# Patient Record
Sex: Female | Born: 1940
Health system: Southern US, Community
[De-identification: ages and names within clinical notes are randomized; demographics above are authoritative.]

## PROBLEM LIST (undated history)

## (undated) DIAGNOSIS — G629 Polyneuropathy, unspecified: Secondary | ICD-10-CM

## (undated) DIAGNOSIS — I10 Essential (primary) hypertension: Secondary | ICD-10-CM

## (undated) DIAGNOSIS — T4145XA Adverse effect of unspecified anesthetic, initial encounter: Secondary | ICD-10-CM

## (undated) DIAGNOSIS — E119 Type 2 diabetes mellitus without complications: Secondary | ICD-10-CM

## (undated) DIAGNOSIS — I4729 Other ventricular tachycardia: Secondary | ICD-10-CM

## (undated) DIAGNOSIS — I495 Sick sinus syndrome: Secondary | ICD-10-CM

## (undated) DIAGNOSIS — M549 Dorsalgia, unspecified: Secondary | ICD-10-CM

## (undated) DIAGNOSIS — I480 Paroxysmal atrial fibrillation: Secondary | ICD-10-CM

## (undated) DIAGNOSIS — E7800 Pure hypercholesterolemia, unspecified: Secondary | ICD-10-CM

## (undated) DIAGNOSIS — E1122 Type 2 diabetes mellitus with diabetic chronic kidney disease: Secondary | ICD-10-CM

## (undated) DIAGNOSIS — D631 Anemia in chronic kidney disease: Secondary | ICD-10-CM

## (undated) DIAGNOSIS — I639 Cerebral infarction, unspecified: Secondary | ICD-10-CM

## (undated) DIAGNOSIS — I472 Ventricular tachycardia: Secondary | ICD-10-CM

## (undated) DIAGNOSIS — T8859XA Other complications of anesthesia, initial encounter: Secondary | ICD-10-CM

## (undated) DIAGNOSIS — R269 Unspecified abnormalities of gait and mobility: Secondary | ICD-10-CM

## (undated) DIAGNOSIS — J449 Chronic obstructive pulmonary disease, unspecified: Secondary | ICD-10-CM

## (undated) DIAGNOSIS — G8929 Other chronic pain: Secondary | ICD-10-CM

## (undated) DIAGNOSIS — J42 Unspecified chronic bronchitis: Secondary | ICD-10-CM

## (undated) DIAGNOSIS — M1990 Unspecified osteoarthritis, unspecified site: Secondary | ICD-10-CM

## (undated) DIAGNOSIS — N184 Chronic kidney disease, stage 4 (severe): Secondary | ICD-10-CM

## (undated) HISTORY — PX: TUMOR EXCISION: SHX421

## (undated) HISTORY — PX: ABDOMINAL HYSTERECTOMY: SHX81

## (undated) HISTORY — DX: Sick sinus syndrome: I49.5

## (undated) HISTORY — PX: JOINT REPLACEMENT: SHX530

## (undated) HISTORY — DX: Paroxysmal atrial fibrillation: I48.0

## (undated) HISTORY — DX: Unspecified abnormalities of gait and mobility: R26.9

## (undated) HISTORY — DX: Other ventricular tachycardia: I47.29

## (undated) HISTORY — PX: APPENDECTOMY: SHX54

## (undated) HISTORY — PX: TOTAL KNEE ARTHROPLASTY: SHX125

## (undated) HISTORY — DX: Ventricular tachycardia: I47.2

---

## 2007-02-25 ENCOUNTER — Inpatient Hospital Stay (HOSPITAL_COMMUNITY): Admission: RE | Admit: 2007-02-25 | Discharge: 2007-03-03 | Payer: Self-pay | Admitting: Orthopedic Surgery

## 2007-03-04 ENCOUNTER — Ambulatory Visit: Payer: Self-pay | Admitting: Internal Medicine

## 2010-06-28 NOTE — Op Note (Signed)
Jody [name], Jody [name] NO.:  [id number]   MEDICAL RECORD NO.:  [id number]          PATIENT TYPE:  INP   LOCATION:  0011                         FACILITY:  [facility]   PHYSICIAN:  Gaynelle Arabian, M.D.    DATE OF BIRTH:  [birth year]   DATE OF PROCEDURE:  02/25/2007  DATE OF DISCHARGE:                               OPERATIVE REPORT   PREOPERATIVE DIAGNOSIS:  Osteoarthritis right knee with valgus  deformity.   POSTOPERATIVE DIAGNOSIS:  Osteoarthritis right knee with valgus  deformity.   PROCEDURE:  Right total knee arthroplasty.   SURGEON:  Gaynelle Arabian, M.D.   ASSISTANT:  Arlee Muslim PA-C   ANESTHESIA:  Spinal with Duramorph.   ESTIMATED BLOOD LOSS:  Minimal.   DRAINS:  None.   TOURNIQUET TIME:  39 minutes at 300 mmHg.   COMPLICATIONS:  None.   CONDITION:  Stable to recovery.   BRIEF CLINICAL NOTE:  [name] is a 70 year old female with end-stage  arthritis right knee about 25-30 degrees valgus deformity.  She has end-  stage arthritis with significant pain and dysfunction.  She has failed  nonoperative management and presents for total knee arthroplasty.   PROCEDURE IN DETAIL:  After successful administration of spinal  anesthetic, a tourniquet is placed high on the right thigh, right lower  extremity prepped and draped in the usual sterile fashion.  Extremity  was wrapped in Esmarch, knee flexed, tourniquet inflated to 300 mmHg.  Midline incision made with a 10 blade through the subcutaneous tissue to  the level of the extensor mechanism.  A fresh blade is used make a  lateral parapatellar arthrotomy.  We went lateral because of the valgus  deformity.  Soft tissue of the proximal lateral tibia is then  subperiosteally elevated around to the posterolateral corner but not  including the structures of the posterolateral corner.  Patella was  everted medially, knee flexed 90 degrees, ACL and PCL removed.  Drill  was used to create a starting hole in the  distal femur and the canal was  thoroughly irrigated.  The 5 degree right valgus alignment guide was  placed and referencing off the posterior condyles, rotations marked and  the block pinned to remove 10 mm of the distal femur.  Distal femoral  resection is made with an oscillating saw.  Size 3 is the most  appropriate femoral component and rotations marked off the epicondylar  axis.  Size three cutting blocks placed and the anterior-posterior and  chamfer cuts are made.   Tibia is subluxed forward and menisci are removed.  An extramedullary  tibial alignment guide is placed referencing proximally at the medial  aspect of the tibial tubercle and distally along the second metatarsal  axis and tibial crest.  Blocks pinned to remove about 2 mm off the more  deficient lateral side.  Tibial resection is made with an oscillating  saw.  Size 3 is the most appropriate tibial component and the proximal  tibia is prepared with the modular drill and keel punch for size 3.  Femoral preparation is completed with the intercondylar cut.   Size  three mobile bearing tibial, trial size 3 posterior stabilized  femoral trial and a 10 mm posterior stabilized rotating platform insert  trial are placed.  With a 10 there is a little bit of AP and varus-  valgus laxity a couple millimeters so I went to 12.5 which allowed for  full extension with excellent anterior-posterior varus and valgus  balance throughout full range of motion.  The patella was everted and  thickness measured to be 23 mm.  Freehand resection taken to 14 mm, 38  template is placed, lug holes were drilled, trial patella is placed and  it tracks normally.  Osteophytes removed from the posterior femur with  the trial in place.  All trials removed and the cut bone surfaces are  prepared with pulsatile lavage.  Cements mixed and once ready for  implantation a size three mobile bearing tibial tray, size 3 posterior  stabilized femur and 35  patella was cemented in place.  Patella is held  with a clamp.  Trial 12.5 insert is placed, knee held in full extension  and all extruded cement removed.  Once cement fully hardened then the  trial was removed and the wound copiously irrigated with saline  solution.  The FloSeal was then placed on the posterior capsule and the  permanent 12.5 mm posterior stabilized rotating platform insert is  placed into the tibial tray.  We then placed remainder of FloSeal in the  medial and lateral gutters and suprapatellar area.  Moist sponge is  placed and tourniquet released with total time of 39 minutes.  Sponge is  held for two minutes removed and minimal bleeding was encountered.  The  bleeding that is encountered stopped with electrocautery.  Wounds again  irrigated with saline solution and then the arthrotomy closed with  interrupted #1 PDS.  Left open a small area from the superior to  inferior pole of patella to serve as a mini lateral release.  Subcu was  closed with interrupted 2-0 Vicryl and subcuticular running 4-0  Monocryl.  Incisions cleaned and dried and Steri-Strips and bulky  sterile dressing applied.  She is then placed into a knee immobilizer,  awakened and transferred to recovery in stable condition.      Gaynelle Arabian, M.D.  Electronically Signed     FA/MEDQ  D:  02/25/2007  T:  02/26/2007  Job:  [id number]

## 2010-07-01 NOTE — Discharge Summary (Signed)
NAMEALEXAS, Jody [name] NO.:  [id number]   MEDICAL RECORD NO.:  [id number]          PATIENT TYPE:  INP   LOCATION:  [place]                         FACILITY:  [facility]   PHYSICIAN:  Gaynelle Arabian, M.D.    DATE OF BIRTH:  04-12-1940   DATE OF ADMISSION:  02/25/2007  DATE OF DISCHARGE:  03/03/2007                               DISCHARGE SUMMARY   ADMISSION DIAGNOSES:  1. Osteoarthritis of the right knee with significant valgus deformity      about 25 degrees.  2. History of migraines.  3. Hypertension.  4. Varicose veins.  5. Non-insulin-dependent diabetes mellitus.   DISCHARGE DIAGNOSES:  1. Osteoarthritis of the right knee with valgus deformity, status post      right total knee arthroplasty.  2. Postoperative blood loss anemia.  3. Status post transfusion without sequela.  4. Postoperative hyponatremia, improved.  5. Postoperative hyperkalemia, improved.  6. Postoperative transient ileus, improved.  7. History of migraines.  8. Hypertension.  9. Varicose veins.  10.Non-insulin-dependent diabetes mellitus.   PROCEDURE:  On February 25, 2007, right total knee surgery by Dr. Gaynelle Arabian, assisted by Jody [name], P.A.C.  Anesthesia was  spinal with Duramorph.   CONSULTATIONS:  [facility].   HISTORY:  Jody [name] is a 70 year old female with end-stage  osteoarthritis of the right knee with about a 25-30 degree valgus  deformity.  She has end-stage arthritis with significant pain and  dysfunction now.  She has failed all conservative management and now  presents for a total knee arthroplasty.   LABORATORY DATA:  Preoperative CBC shows a low hemoglobin of 10,  hematocrit 28.8, white cell count 6.9, platelets 209.  Hemoglobin  dropped down to 8.4.  She was given blood and went back up to 9.5 and  drifted back down to 8.4.  The last H&H 8 and 23.6 respectively.  PT and  PTT preoperatively 12.8 and 25 respectively.  INR 0.9.  Serial  pro-times  followed with PTT and INR 24.1 and 2.1 respectively.  Chemistry panel on  admission:  BUN and creatinine elevated at 27 and 1.64 respectively.  The remaining chemistry panel within normal limits.  Serial BMETs were  followed:  Sodium did drop from 143 to 133, back up to 139.  Potassium  was 4.5, up to 5.3 and back down to 3.6.  BUN went up to 33, back down  to 16.  Creatinine went from 1.6 up to 1.84, back down to 1.19.  Amylase  and lipase taken on February 28, 2007:  Normal at 101 and 29.  Repeat  chemistry panel on February 28, 2007, showed a drop in the total protein  from 7 to 5.9 and a drop in the albumin from 3.5 to 2.7.  Liver function  tests were normal preop and on follow-up set.  Urinalysis:  Trace  leukocyte esterase with a few epithelial cells.  Otherwise negative with  0-2 white cells and rare bacteria.  Follow-up urinalysis showed a trace  of hemoglobin, a few epithelial cells, 0-2 white cells, 0-2 red cells, a  few bacteria.  Urine culture:  E. Coli.   Electrocardiogram dated February 20, 2007:  Marked sinus bradycardia,  minimal voltage criteria for left ventricular dysfunction, read by  Dr.  Babs Bertin.   X-RAYS:  A portable abdomen on February 28, 2007:  Mild gaseous bowel  distention, suggesting an ileus.  Abdomen and acute chest on March 01, 2007:  Elevation of the right hemidiaphragm bibasilar atelectasis.  Mild  cardiac enlargement.  Moderate stool throughout the colon may suggest  constipation.  No evidence of ileus or small bowel obstruction.   HOSPITAL COURSE:  The patient admitted to [facility] and was  taken to the operating room and underwent the above-stated procedure  without complications.  The patient tolerated the procedure well.  She  was transferred to the recovery room and then to the orthopedic floor.  She was started on a PCA for pain control, with improvement of the pain  following surgery.  She was doing a little bit  better on the morning of  postoperative day one.  She had some low urinary outputs, so was given a  fluid challenge.  She was noted to have some elevated potassium.  We  held the chlorthalidone.  Metformin was on hold due to the elevated BUN  and creatinine.  Started on a sliding scale for diabetes.  Hemoglobin  was down to 8.4, so we gave her a couple of units of blood.  She  tolerated the blood well.  Felt a little bit better on the morning of  day two.  Had already started getting up with therapy, started mobility.  Doing a little bit better on day two.  Hemoglobin was back up to 9.5.  Her output had improved.  She was noted to have low sodiums.  We stopped  the fluids.  She was diuresing well.  Serum glucose was improving.  She  was started on the iron supplement by day three; however, she had  developed some right-sided groin pain and abdominal pain.  Amylase and  lipase were checked, which were found to be normal.  BUN and creatinine  were still a little elevated, but they were improving.  The abdominal  films suggested mild gaseous distention, suggestive of an ileus.  We got  a GI consultation.  The patient was seen in consultation by [facility].  Enemas and medications were ordered.  Repeat KUB  showed an ileus but clinically the patient is improved after removing  __________  on day four.  Hemoglobin was back down to 8.4.  Continue  with the iron.  Urinalysis and urine culture had been ordered by  gastroenterology, due to the lower abdominal pain, and those results  are still pending.  She felt much better that day after the results with  the enema.  The ileus was felt to be transient.  She was improving.  Started advancing her diet over the weekend. The patient slowly  progressed with physical therapy.  Improved from a gastroenterology  standpoint.  Tolerated medications.   By March 03, 2007, the patient was doing well.  Had a low hemoglobin,  but asymptomatic  with this.  She was discharged home.   DISCHARGE PLAN:  Discharged home on March 03, 2007.   DISCHARGE MEDICATIONS:  1. Percocet.  2. Robaxin.  3. Nu-Iron.  4. Coumadin.  5. Colace.  6. Ambien.   DIET:  A heart-healthy, diabetic diet.   ACTIVITY:  Weightbearing as tolerated for right lower extremity.  Total  knee protocol.  FOLLOWUP:  Follow up in two weeks.   DISPOSITION/CONDITION ON DISCHARGE:  Improving.   NOTATION:  Please note that the urine culture was pending at the time of  discharge, but did prove to be Escherichia coli.  This will be followed  up on an outpatient basis.      Jody [name], P.A.C.      Gaynelle Arabian, M.D.  Electronically Signed    ALP/MEDQ  D:  04/16/2007  T:  04/16/2007  Job:  [id number]   cc:   Sherril Cong, MD   [facility]   Gaynelle Arabian, M.D.  Fax: [phone]

## 2010-07-01 NOTE — H&P (Signed)
NAMENANCYJO, SEEPERSAD NO.:  [id number]   MEDICAL RECORD NO.:  [id number]        PATIENT TYPE:  LINP   LOCATION:                               FACILITY:  [facility]   PHYSICIAN:  Gaynelle Arabian, M.D.    DATE OF BIRTH:  February 03, 1941   DATE OF ADMISSION:  02/25/2007  DATE OF DISCHARGE:                              HISTORY & PHYSICAL   DATE OF OFFICE VISIT HISTORY AND PHYSICAL:  February 04, 2007   CHIEF COMPLAINT:  Right knee pain.   HISTORY OF PRESENT ILLNESS:  The patient is a 70 year old female who has  been seen by Dr. Wynelle Link for a second opinion for ongoing right knee  pain.  She has known arthritic knees.  She has been treated  conservatively in the past.  She is seen as a second opinion and found  to have horrible  end-stage arthritis of the right knee with about a 25  valgus malalignment deformity with bone-on-bone.  She is felt to benefit  from undergoing surgical intervention.  Risks and benefits have been  discussed and she elects to proceed with surgery.   ALLERGIES:  NO KNOWN DRUG ALLERGIES.   INTOLERANCES:  CODEINE causes headache and dizziness.   CURRENT MEDICATIONS:  1. Glucovance.  2. Chlorthalidone/atenolol.  3. Advil or Aleve.   PAST MEDICAL HISTORY:  1. History of migraines.  2. Hypertension.  3. Varicose veins.  4. Non-insulin-dependent diabetes mellitus.  5. Arthritis.  6. Bursitis.   PAST SURGICAL HISTORY:  Abdominal tumor removed about 40 years ago.   FAMILY HISTORY:  Father deceased in an auto accident in his 55s.  Mother  deceased at age 26.  Sister with breast cancer.   SOCIAL HISTORY:  Married, retired.  Past history of smoking.  No  alcohol.  Two steps entering her home.   REVIEW OF SYSTEMS:  GENERAL:  No fevers, chills, night sweats.  NEUROLOGIC:  Occasional dizziness and headache.  Has not had any  headaches recently.  No seizures, syncope, paralysis.  RESPIRATORY:  No  shortness breath, productive cough or hemoptysis.   CARDIOVASCULAR:  No  chest pain, angina, orthopnea.  GI:  No nausea, vomiting, diarrhea or  constipation.  GU:  No dysuria or hematuria.  MUSCULOSKELETAL:  Joint  pain with the right knee.   PHYSICAL EXAMINATION:  VITAL SIGNS:  Pulse 52, respirations 14, blood  pressure 160/88.  GENERAL:  A 70 year old African-American female, well-nourished, well-  developed, slightly overweight, in no acute distress.  She is alert and  oriented.  Very pleasant.  She is accompanied by her husband.  HEENT:  Normocephalic and atraumatic.  Pupils are round and reactive.  Oropharynx clear.  Extraocular movements are intact.  NECK:  Supple.  CHEST:  Clear anterior and posterior chest walls.  No rhonchi, rales or  wheezing.  HEART:  Regular rate and rhythm.  She has an S1 normal but a split S2  with a faint early systolic ejection murmur graded 2/6 over aortic  point.  ABDOMEN:  Soft, round.  Bowel sounds present.  RECTAL/BREAST/GENITALIA:  Not done; not pertinent to present illness.  EXTREMITIES:  Right knee has a very significant valgus deformity on the  right.  Range of motion of 5 to 125.  Tender more lateral than medial.  No instability.   IMPRESSION:  1. Osteoarthritis, right knee, with significant valgus deformity about      25 degrees.  2. History of migraines.  3. Hypertension.  4. Varicose veins.  5. Non-insulin-dependent diabetes mellitus.   PLAN:  The patient was admitted to [facility] to undergo right  total knee replacement arthroplasty.  Surgery will be performed by Dr.  Gaynelle Arabian.      Alexzandrew L. Perkins, P.A.C.      Gaynelle Arabian, M.D.  Electronically Signed    ALP/MEDQ  D:  02/04/2007  T:  02/04/2007  Job:  [id number]   cc:   Sherril Cong, MD

## 2010-11-03 LAB — BASIC METABOLIC PANEL
BUN: 26 — ABNORMAL HIGH
BUN: 24 — ABNORMAL HIGH
CO2: 27
CO2: 25
Chloride: 104
Chloride: 98
Chloride: 104
Chloride: 96
Creatinine, Ser: 1.84 — ABNORMAL HIGH
GFR calc non Af Amer: 30 — ABNORMAL LOW
GFR calc non Af Amer: 38 — ABNORMAL LOW
GFR calc non Af Amer: 45 — ABNORMAL LOW
Glucose, Bld: 162 — ABNORMAL HIGH
Glucose, Bld: 169 — ABNORMAL HIGH
Potassium: 4
Potassium: 5.3 — ABNORMAL HIGH
Potassium: 3.6
Potassium: 3.9
Sodium: 133 — ABNORMAL LOW
Sodium: 139
Sodium: 134 — ABNORMAL LOW
Sodium: 139

## 2010-11-03 LAB — URINALYSIS, ROUTINE W REFLEX MICROSCOPIC
Bilirubin Urine: NEGATIVE
Hgb urine dipstick: NEGATIVE
Ketones, ur: NEGATIVE
Ketones, ur: NEGATIVE
Nitrite: NEGATIVE
Protein, ur: NEGATIVE
Specific Gravity, Urine: 1.02
Urobilinogen, UA: 1
Urobilinogen, UA: 0.2
pH: 6
pH: 6

## 2010-11-03 LAB — CBC
HCT: 24.3 — ABNORMAL LOW
HCT: 27.2 — ABNORMAL LOW
HCT: 28.8 — ABNORMAL LOW
HCT: 25 — ABNORMAL LOW
HCT: 26 — ABNORMAL LOW
Hemoglobin: 10 — ABNORMAL LOW
Hemoglobin: 8.4 — ABNORMAL LOW
Hemoglobin: 9.5 — ABNORMAL LOW
Hemoglobin: 8.8 — ABNORMAL LOW
Hemoglobin: 9.2 — ABNORMAL LOW
MCHC: 34.6
MCHC: 34.7
MCHC: 35
MCHC: 35.3
MCV: 89.5
MCV: 91.7
MCV: 90.2
Platelets: 147 — ABNORMAL LOW
RBC: 2.64 — ABNORMAL LOW
RBC: 3.04 — ABNORMAL LOW
RBC: 2.67 — ABNORMAL LOW
RBC: 2.89 — ABNORMAL LOW
RDW: 13.4
RDW: 13.5
RDW: 14.9
WBC: 10
WBC: 10.2
WBC: 8

## 2010-11-03 LAB — DIFFERENTIAL
Basophils Relative: 0
Monocytes Relative: 8
Neutro Abs: 5.9
Neutrophils Relative %: 74

## 2010-11-03 LAB — PROTIME-INR
INR: 0.9
INR: 1.1
INR: 1.4
INR: 1.9 — ABNORMAL HIGH
INR: 2.1 — ABNORMAL HIGH
Prothrombin Time: 12.8
Prothrombin Time: 22 — ABNORMAL HIGH

## 2010-11-03 LAB — URINE MICROSCOPIC-ADD ON

## 2010-11-03 LAB — URINE CULTURE: Special Requests: NEGATIVE

## 2010-11-03 LAB — AMYLASE: Amylase: 101

## 2010-11-03 LAB — TYPE AND SCREEN
ABO/RH(D): O POS
Antibody Screen: NEGATIVE

## 2010-11-03 LAB — COMPREHENSIVE METABOLIC PANEL
Albumin: 3.5
BUN: 27 — ABNORMAL HIGH
Calcium: 9.4
Creatinine, Ser: 1.64 — ABNORMAL HIGH
Glucose, Bld: 121 — ABNORMAL HIGH
Total Protein: 7

## 2010-11-03 LAB — HEMOGLOBIN AND HEMATOCRIT, BLOOD: Hemoglobin: 8 — ABNORMAL LOW

## 2010-11-03 LAB — HEPATIC FUNCTION PANEL
ALT: 10
AST: 17
Albumin: 2.7 — ABNORMAL LOW
Total Bilirubin: 1

## 2010-11-03 LAB — PREPARE RBC (CROSSMATCH)

## 2013-02-13 DIAGNOSIS — G629 Polyneuropathy, unspecified: Secondary | ICD-10-CM

## 2013-02-13 HISTORY — DX: Polyneuropathy, unspecified: G62.9

## 2013-12-02 ENCOUNTER — Encounter (HOSPITAL_COMMUNITY): Payer: Self-pay | Admitting: Emergency Medicine

## 2013-12-02 ENCOUNTER — Emergency Department (HOSPITAL_COMMUNITY): Payer: Medicare HMO

## 2013-12-02 ENCOUNTER — Emergency Department (HOSPITAL_COMMUNITY)
Admission: EM | Admit: 2013-12-02 | Discharge: 2013-12-02 | Disposition: A | Discharge status: Home / Self Care | Payer: Medicare HMO | Attending: Emergency Medicine | Admitting: Emergency Medicine

## 2013-12-02 DIAGNOSIS — M1990 Unspecified osteoarthritis, unspecified site: Secondary | ICD-10-CM | POA: Insufficient documentation

## 2013-12-02 DIAGNOSIS — R05 Cough: Secondary | ICD-10-CM | POA: Insufficient documentation

## 2013-12-02 DIAGNOSIS — Z7982 Long term (current) use of aspirin: Secondary | ICD-10-CM | POA: Diagnosis not present

## 2013-12-02 DIAGNOSIS — E1142 Type 2 diabetes mellitus with diabetic polyneuropathy: Secondary | ICD-10-CM

## 2013-12-02 DIAGNOSIS — Z79899 Other long term (current) drug therapy: Secondary | ICD-10-CM | POA: Insufficient documentation

## 2013-12-02 DIAGNOSIS — Z87891 Personal history of nicotine dependence: Secondary | ICD-10-CM | POA: Insufficient documentation

## 2013-12-02 DIAGNOSIS — I10 Essential (primary) hypertension: Secondary | ICD-10-CM | POA: Diagnosis not present

## 2013-12-02 DIAGNOSIS — R200 Anesthesia of skin: Secondary | ICD-10-CM | POA: Diagnosis present

## 2013-12-02 HISTORY — DX: Unspecified osteoarthritis, unspecified site: M19.90

## 2013-12-02 HISTORY — DX: Essential (primary) hypertension: I10

## 2013-12-02 LAB — CBC WITH DIFFERENTIAL/PLATELET
BASOS PCT: 0 % (ref 0–1)
Basophils Absolute: 0 10*3/uL (ref 0.0–0.1)
EOS ABS: 0.3 10*3/uL (ref 0.0–0.7)
EOS PCT: 5 % (ref 0–5)
HCT: 28.3 % — ABNORMAL LOW (ref 36.0–46.0)
HEMOGLOBIN: 9.3 g/dL — AB (ref 12.0–15.0)
LYMPHS ABS: 1.4 10*3/uL (ref 0.7–4.0)
Lymphocytes Relative: 25 % (ref 12–46)
MCH: 30 pg (ref 26.0–34.0)
MCHC: 32.9 g/dL (ref 30.0–36.0)
MCV: 91.3 fL (ref 78.0–100.0)
MONOS PCT: 6 % (ref 3–12)
Monocytes Absolute: 0.3 10*3/uL (ref 0.1–1.0)
NEUTROS PCT: 64 % (ref 43–77)
Neutro Abs: 3.6 10*3/uL (ref 1.7–7.7)
PLATELETS: 200 10*3/uL (ref 150–400)
RBC: 3.1 MIL/uL — ABNORMAL LOW (ref 3.87–5.11)
RDW: 14.5 % (ref 11.5–15.5)
WBC: 5.7 10*3/uL (ref 4.0–10.5)

## 2013-12-02 LAB — BASIC METABOLIC PANEL
Anion gap: 12 (ref 5–15)
BUN: 24 mg/dL — AB (ref 6–23)
CALCIUM: 9.5 mg/dL (ref 8.4–10.5)
CO2: 25 mEq/L (ref 19–32)
Chloride: 106 mEq/L (ref 96–112)
Creatinine, Ser: 1.58 mg/dL — ABNORMAL HIGH (ref 0.50–1.10)
GFR, EST AFRICAN AMERICAN: 36 mL/min — AB (ref >90)
GFR, EST NON AFRICAN AMERICAN: 31 mL/min — AB (ref >90)
GLUCOSE: 97 mg/dL (ref 70–99)
POTASSIUM: 4.3 meq/L (ref 3.7–5.3)
Sodium: 143 mEq/L (ref 137–147)

## 2013-12-02 LAB — CK: Total CK: 169 U/L (ref 7–177)

## 2013-12-02 MED ORDER — BENZONATATE 100 MG PO CAPS: 100.0000 mg | ORAL_CAPSULE | Freq: Three times a day (TID) | ORAL | Status: DC

## 2013-12-02 MED ORDER — GABAPENTIN 100 MG PO CAPS: 100.0000 mg | ORAL_CAPSULE | Freq: Three times a day (TID) | ORAL | Status: DC

## 2013-12-02 MED ORDER — HYDROCODONE-ACETAMINOPHEN 5-325 MG PO TABS: 1.0000 | ORAL_TABLET | Freq: Every day | ORAL | Status: DC

## 2013-12-02 NOTE — ED Notes (Signed)
Pt says numbness of arms and legs for 1 year.  Uses walker at home, Dx with  Arthritis.

## 2013-12-02 NOTE — ED Notes (Signed)
Pt alert & oriented x4, stable gait. Patient given discharge instructions, paperwork & prescription(s). Patient  instructed to stop at the registration desk to finish any additional paperwork. Patient verbalized understanding. Pt left department w/ no further questions. 

## 2013-12-02 NOTE — ED Provider Notes (Signed)
CSN: [id number]     Arrival date & time 12/02/13  1231 History   This chart was scribed for Tanna Furry, MD by Ludger Nutting, ED Scribe. This patient was seen in room APA03/APA03 and the patient's care was started 1:40 PM.    No chief complaint on file.  The history is provided by the patient. No language interpreter was used.    HPI Comments: Jody [name] is a 73 y.o. female who presents to the Emergency Department complaining of 1 year of constant, gradually worsened pain and altered sensation to the bilateral feet and hands that has worsened over the last few weeks. She states the symptoms are worse in her lower extremities and has difficulty walking in the mornings. She reports a history of NIDDM for the past 15 years. She has been seen at [facility] and by PCP for the same symptoms. She has a history of an MI. She denies history of CVA or kidney disease. She denies fever, SOB.   Past Medical History  Diagnosis Date  . Arthritis   . Diabetes mellitus without complication   . Hypertension    Past Surgical History  Procedure Laterality Date  . Abdominal surgery    . Joint replacement     History reviewed. No pertinent family history. History  Substance Use Topics  . Smoking status: Former Research scientist (life sciences)  . Smokeless tobacco: Not on file  . Alcohol Use: No   OB History   Grav Para Term Preterm Abortions TAB SAB Ect Mult Living                 Review of Systems  Constitutional: Negative.  Negative for fever.  HENT: Negative.   Eyes: Negative.   Respiratory: Positive for cough. Negative for shortness of breath.   Cardiovascular: Negative.   Gastrointestinal: Negative.   Musculoskeletal: Positive for arthralgias and myalgias.  Neurological: Positive for numbness (altered sensation).  Psychiatric/Behavioral: Negative.   All other systems reviewed and are negative.     Allergies  Review of patient's allergies indicates no known allergies.  Home Medications   Prior to Admission  medications   Medication Sig Start Date End Date Taking? Authorizing Provider  aspirin EC 81 MG tablet Take 81 mg by mouth daily.   Yes Historical Provider, MD  benazepril (LOTENSIN) 40 MG tablet Take 1 tablet by mouth daily. 10/13/13  Yes Historical Provider, MD  carvedilol (COREG) 3.125 MG tablet Take 1 tablet by mouth 2 (two) times daily. 10/14/13  Yes Historical Provider, MD  furosemide (LASIX) 40 MG tablet Take 1 tablet by mouth daily. 10/13/13  Yes Historical Provider, MD  hydrALAZINE (APRESOLINE) 25 MG tablet Take 1 tablet by mouth 2 (two) times daily. 10/13/13  Yes Historical Provider, MD  metFORMIN (GLUCOPHAGE) 1000 MG tablet Take 1 tablet by mouth 2 (two) times daily. 10/13/13  Yes Historical Provider, MD  simvastatin (ZOCOR) 40 MG tablet Take 1 tablet by mouth every evening. 10/14/13  Yes Historical Provider, MD  SYMBICORT 160-4.5 MCG/ACT inhaler Inhale 2 puffs into the lungs 2 (two) times daily. 10/13/13  Yes Historical Provider, MD  traMADol (ULTRAM) 50 MG tablet Take 1 tablet by mouth 3 (three) times daily as needed. 08/31/13  Yes Historical Provider, MD  benzonatate (TESSALON) 100 MG capsule Take 1 capsule (100 mg total) by mouth every 8 (eight) hours. 12/02/13   Tanna Furry, MD  gabapentin (NEURONTIN) 100 MG capsule Take 1 capsule (100 mg total) by mouth 3 (three) times daily. 12/02/13   Elta Guadeloupe  Jeneen Rinks, MD  HYDROcodone-acetaminophen (NORCO/VICODIN) 5-325 MG per tablet Take 1 tablet by mouth at bedtime. 12/02/13   Tanna Furry, MD   Pulse 66  Temp(Src) 98.7 F (37.1 C) (Oral)  Ht 5\' 7"  (1.702 m)  Wt 192 lb (87.091 kg)  BMI 30.06 kg/m2  SpO2 99% Physical Exam  Nursing note and vitals reviewed. Constitutional: She is oriented to person, place, and time. She appears well-developed and well-nourished. No distress.  HENT:  Head: Normocephalic and atraumatic.  Eyes: Conjunctivae are normal. Pupils are equal, round, and reactive to light. No scleral icterus.  Neck: Normal range of motion. Neck  supple. No thyromegaly present.  Cardiovascular: Normal rate and regular rhythm.  Exam reveals no gallop and no friction rub.   No murmur heard. Pulses:      Radial pulses are 2+ on the right side, and 2+ on the left side.       Femoral pulses are 2+ on the right side, and 2+ on the left side.      Dorsalis pedis pulses are 2+ on the right side, and 2+ on the left side.  Pulmonary/Chest: Effort normal and breath sounds normal. No respiratory distress. She has no wheezes. She has no rales.  Abdominal: Soft. Bowel sounds are normal. She exhibits no distension. There is no tenderness. There is no rebound.  Musculoskeletal: Normal range of motion.  Neurological: She is alert and oriented to person, place, and time.  Reports numbness and pain from elbows and knees distally.   Skin: Skin is warm and dry. No rash noted.  Psychiatric: She has a normal mood and affect. Her behavior is normal.    ED Course  Procedures   DIAGNOSTIC STUDIES: Oxygen Saturation is 99% on RA, normal by my interpretation.    COORDINATION OF CARE: 1:46 PM Discussed treatment plan with pt at bedside and pt agreed to plan.   Labs Review Labs Reviewed  CBC WITH DIFFERENTIAL - Abnormal; Notable for the following:    RBC 3.10 (*)    Hemoglobin 9.3 (*)    HCT 28.3 (*)    All other components within normal limits  BASIC METABOLIC PANEL - Abnormal; Notable for the following:    BUN 24 (*)    Creatinine, Ser 1.58 (*)    GFR calc non Af Amer 31 (*)    GFR calc Af Amer 36 (*)    All other components within normal limits  CK    Imaging Review Dg Chest 2 View  12/02/2013   CLINICAL DATA:  Cough.  EXAM: CHEST  2 VIEW  COMPARISON:  Jul 09, 2013.  FINDINGS: The heart size and mediastinal contours are within normal limits. Both lungs are clear. No pneumothorax or pleural effusion is noted. The visualized skeletal structures are unremarkable.  IMPRESSION: No acute cardiopulmonary abnormality seen.   Electronically Signed    By: Sabino Dick M.D.   On: 12/02/2013 14:28     EKG Interpretation None      MDM   Final diagnoses:  Diabetic polyneuropathy associated with type 2 diabetes mellitus    Reassuring studies. Some renal insufficiency likely due to her diabetes. This would be consistent with timeline of 20 years of diabetes.  Diabetes leading to her neuropathy as well. Plan is Neurontin, PCP followup.  I personally performed the services described in this documentation, which was scribed in my presence. The recorded information has been reviewed and is accurate.    Tanna Furry, MD 12/02/13 [phone]

## 2013-12-02 NOTE — Discharge Instructions (Signed)
Diabetic Neuropathy Diabetic neuropathy is a nerve disease or nerve damage that is caused by diabetes mellitus. About half of all people with diabetes mellitus have some form of nerve damage. Nerve damage is more common in those who have had diabetes mellitus for many years and who generally have not had good control of their blood sugar (glucose) level. Diabetic neuropathy is a common complication of diabetes mellitus. There are three more common types of diabetic neuropathy and a fourth type that is less common and less understood:   Peripheral neuropathy--This is the most common type of diabetic neuropathy. It causes damage to the nerves of the feet and legs first and then eventually the hands and arms.The damage affects the ability to sense touch.  Autonomic neuropathy--This type causes damage to the autonomic nervous system, which controls the following functions:  Heartbeat.  Body temperature.  Blood pressure.  Urination.  Digestion.  Sweating.  Sexual function.  Focal neuropathy--Focal neuropathy can be painful and unpredictable and occurs most often in older adults with diabetes mellitus. It involves a specific nerve or one area and often comes on suddenly. It usually does not cause long-term problems.  Radiculoplexus neuropathy-- Sometimes called lumbosacral radiculoplexus neuropathy, radiculoplexus neuropathy affects the nerves of the thighs, hips, buttocks, or legs. It is more common in people with type 2 diabetes mellitus and in older men. It is characterized by debilitating pain, weakness, and atrophy, usually in the thigh muscles. CAUSES  The cause of peripheral, autonomic, and focal neuropathies is diabetes mellitus that is uncontrolled and high glucose levels. The cause of radiculoplexus neuropathy is unknown. However, it is thought to be caused by inflammation related to uncontrolled glucose levels. SIGNS AND SYMPTOMS  Peripheral Neuropathy Peripheral neuropathy develops  slowly over time. When the nerves of the feet and legs no longer work there may be:   Burning, stabbing, or aching pain in the legs or feet.  Inability to feel pressure or pain in your feet. This can lead to:  Thick calluses over pressure areas.  Pressure sores.  Ulcers.  Foot deformities.  Reduced ability to feel temperature changes.  Muscle weakness. Autonomic Neuropathy The symptoms of autonomic neuropathy vary depending on which nerves are affected. Symptoms may include:  Problems with digestion, such as:  Feeling sick to your stomach (nausea).  Vomiting.  Bloating.  Constipation.  Diarrhea.  Abdominal pain.  Difficulty with urination. This occurs if you lose your ability to sense when your bladder is full. Problems include:  Urine leakage (incontinence).  Inability to empty your bladder completely (retention).  Rapid or irregular heartbeat (palpitations).  Blood pressure drops when you stand up (orthostatic hypotension). When you stand up you may feel:  Dizzy.  Weak.  Faint.  In men, inability to attain and maintain an erection.  In women, vaginal dryness and problems with decreased sexual desire and arousal.  Problems with body temperature regulation.  Increased or decreased sweating. Focal Neuropathy  Abnormal eye movements or abnormal alignment of both eyes.  Weakness in the wrist.  Foot drop. This results in an inability to lift the foot properly and abnormal walking or foot movement.  Paralysis on one side of your face (Bell palsy).  Chest or abdominal pain. Radiculoplexus Neuropathy  Sudden, severe pain in your hip, thigh, or buttocks.  Weakness and wasting of thigh muscles.  Difficulty rising from a seated position.  Abdominal swelling.  Unexplained weight loss (usually more than 10 lb [4.5 kg]). DIAGNOSIS  Peripheral Neuropathy Your senses may   be tested. Sensory function testing can be done with:  A light touch using a  monofilament.  A vibration with tuning fork.  A sharp sensation with a pin prick. Other tests that can help diagnose neuropathy are:  Nerve conduction velocity. This test checks the transmission of an electrical current through a nerve.  Electromyography. This shows how muscles respond to electrical signals transmitted by nearby nerves.  Quantitative sensory testing. This is used to assess how your nerves respond to vibrations and changes in temperature. Autonomic Neuropathy Diagnosis is often based on reported symptoms. Tell your health care provider if you experience:   Dizziness.   Constipation.   Diarrhea.   Inappropriate urination or inability to urinate.   Inability to get or maintain an erection.  Tests that may be done include:   Electrocardiography or Holter monitor. These are tests that can help show problems with the heart rate or heart rhythm.   An X-ray exam may be done. Focal Neuropathy Diagnosis is made based on your symptoms and what your health care provider finds during your exam. Other tests may be done. They may include:  Nerve conduction velocities. This checks the transmission of electrical current through a nerve.  Electromyography. This shows how muscles respond to electrical signals transmitted by nearby nerves.  Quantitative sensory testing. This test is used to assess how your nerves respond to vibration and changes in temperature. Radiculoplexus Neuropathy  Often the first thing is to eliminate any other issue or problems that might be the cause, as there is no stick test for diagnosis.  X-ray exam of your spine and lumbar region.  Spinal tap to rule out cancer.  MRI to rule out other lesions. TREATMENT  Once nerve damage occurs, it cannot be reversed. The goal of treatment is to keep the disease or nerve damage from getting worse and affecting more nerve fibers. Controlling your blood glucose level is the key. Most people with  radiculoplexus neuropathy see at least a partial improvement over time. You will need to keep your blood glucose and HbA1c levels in the target range determined by your health care provider. Things that help control blood glucose levels include:   Blood glucose monitoring.   Meal planning.   Physical activity.   Diabetes medicine.  Over time, maintaining lower blood glucose levels helps lessen symptoms. Sometimes, prescription pain medicine is needed. HOME CARE INSTRUCTIONS:  Do not smoke.  Keep your blood glucose level in the range that you and your health care provider have determined acceptable for you.  Keep your blood pressure level in the range that you and your health care provider have determined acceptable for you.  Eat a well-balanced diet.  Be active every day.  Check your feet every day. SEEK MEDICAL CARE IF:   You have burning, stabbing, or aching pain in the legs or feet.  You are unable to feel pressure or pain in your feet.  You develop problems with digestion such as:  Nausea.  Vomiting.  Bloating.  Constipation.  Diarrhea.  Abdominal pain.  You have difficulty with urination, such as:  Incontinence.  Retention.  You have palpitations.  You develop orthostatic hypotension. When you stand up you may feel:  Dizzy.  Weak.  Faint.  You cannot attain and maintain an erection (in men).  You have vaginal dryness and problems with decreased sexual desire and arousal (in women).  You have severe pain in your thighs, legs, or buttocks.  You have unexplained weight loss.   Document Released: 04/10/2001 Document Revised: 11/20/2012 Document Reviewed: 07/11/2012 [employer] Patient Information 2015 [employer]. This information is not intended to replace advice given to you by your health care provider. Make sure you discuss any questions you have with your health care provider. 

## 2014-05-15 DIAGNOSIS — I639 Cerebral infarction, unspecified: Secondary | ICD-10-CM

## 2014-05-15 HISTORY — DX: Cerebral infarction, unspecified: I63.9

## 2014-05-27 ENCOUNTER — Encounter (HOSPITAL_COMMUNITY): Payer: Self-pay | Admitting: *Deleted

## 2014-05-27 ENCOUNTER — Emergency Department (HOSPITAL_COMMUNITY): Payer: Medicare HMO

## 2014-05-27 ENCOUNTER — Inpatient Hospital Stay (HOSPITAL_COMMUNITY)
Admission: EM | Admit: 2014-05-27 | Discharge: 2014-05-30 | DRG: 069 | Disposition: A | Discharge status: Home Health | Payer: Medicare HMO | Attending: Family Medicine | Admitting: Family Medicine

## 2014-05-27 DIAGNOSIS — I252 Old myocardial infarction: Secondary | ICD-10-CM | POA: Diagnosis not present

## 2014-05-27 DIAGNOSIS — G459 Transient cerebral ischemic attack, unspecified: Principal | ICD-10-CM | POA: Diagnosis present

## 2014-05-27 DIAGNOSIS — R29810 Facial weakness: Secondary | ICD-10-CM | POA: Diagnosis present

## 2014-05-27 DIAGNOSIS — J45909 Unspecified asthma, uncomplicated: Secondary | ICD-10-CM | POA: Diagnosis present

## 2014-05-27 DIAGNOSIS — I6529 Occlusion and stenosis of unspecified carotid artery: Secondary | ICD-10-CM | POA: Diagnosis present

## 2014-05-27 DIAGNOSIS — I63012 Cerebral infarction due to thrombosis of left vertebral artery: Secondary | ICD-10-CM | POA: Diagnosis not present

## 2014-05-27 DIAGNOSIS — I639 Cerebral infarction, unspecified: Secondary | ICD-10-CM | POA: Diagnosis not present

## 2014-05-27 DIAGNOSIS — G451 Carotid artery syndrome (hemispheric): Secondary | ICD-10-CM | POA: Insufficient documentation

## 2014-05-27 DIAGNOSIS — R471 Dysarthria and anarthria: Secondary | ICD-10-CM | POA: Diagnosis present

## 2014-05-27 DIAGNOSIS — E119 Type 2 diabetes mellitus without complications: Secondary | ICD-10-CM | POA: Diagnosis present

## 2014-05-27 DIAGNOSIS — I129 Hypertensive chronic kidney disease with stage 1 through stage 4 chronic kidney disease, or unspecified chronic kidney disease: Secondary | ICD-10-CM | POA: Diagnosis present

## 2014-05-27 DIAGNOSIS — N183 Chronic kidney disease, stage 3 (moderate): Secondary | ICD-10-CM | POA: Diagnosis present

## 2014-05-27 DIAGNOSIS — Z79899 Other long term (current) drug therapy: Secondary | ICD-10-CM

## 2014-05-27 DIAGNOSIS — N179 Acute kidney failure, unspecified: Secondary | ICD-10-CM | POA: Diagnosis present

## 2014-05-27 DIAGNOSIS — G8191 Hemiplegia, unspecified affecting right dominant side: Secondary | ICD-10-CM | POA: Diagnosis present

## 2014-05-27 DIAGNOSIS — E785 Hyperlipidemia, unspecified: Secondary | ICD-10-CM | POA: Insufficient documentation

## 2014-05-27 DIAGNOSIS — G458 Other transient cerebral ischemic attacks and related syndromes: Secondary | ICD-10-CM | POA: Insufficient documentation

## 2014-05-27 DIAGNOSIS — I509 Heart failure, unspecified: Secondary | ICD-10-CM | POA: Diagnosis present

## 2014-05-27 DIAGNOSIS — I6789 Other cerebrovascular disease: Secondary | ICD-10-CM | POA: Diagnosis not present

## 2014-05-27 DIAGNOSIS — I10 Essential (primary) hypertension: Secondary | ICD-10-CM | POA: Diagnosis not present

## 2014-05-27 DIAGNOSIS — E1159 Type 2 diabetes mellitus with other circulatory complications: Secondary | ICD-10-CM | POA: Diagnosis not present

## 2014-05-27 DIAGNOSIS — Z87891 Personal history of nicotine dependence: Secondary | ICD-10-CM | POA: Diagnosis not present

## 2014-05-27 DIAGNOSIS — M1990 Unspecified osteoarthritis, unspecified site: Secondary | ICD-10-CM | POA: Diagnosis present

## 2014-05-27 LAB — CBG MONITORING, ED: GLUCOSE-CAPILLARY: 113 mg/dL — AB (ref 70–99)

## 2014-05-27 LAB — URINE MICROSCOPIC-ADD ON

## 2014-05-27 LAB — DIFFERENTIAL
BASOS ABS: 0 10*3/uL (ref 0.0–0.1)
Basophils Relative: 0 % (ref 0–1)
EOS ABS: 0.2 10*3/uL (ref 0.0–0.7)
Eosinophils Relative: 5 % (ref 0–5)
LYMPHS ABS: 1.5 10*3/uL (ref 0.7–4.0)
Lymphocytes Relative: 28 % (ref 12–46)
Monocytes Absolute: 0.3 10*3/uL (ref 0.1–1.0)
Monocytes Relative: 6 % (ref 3–12)
Neutro Abs: 3.3 10*3/uL (ref 1.7–7.7)
Neutrophils Relative %: 61 % (ref 43–77)

## 2014-05-27 LAB — COMPREHENSIVE METABOLIC PANEL
ALBUMIN: 3.1 g/dL — AB (ref 3.5–5.2)
ALT: 13 U/L (ref 0–35)
AST: 16 U/L (ref 0–37)
Alkaline Phosphatase: 69 U/L (ref 39–117)
Anion gap: 9 (ref 5–15)
BILIRUBIN TOTAL: 0.7 mg/dL (ref 0.3–1.2)
BUN: 29 mg/dL — ABNORMAL HIGH (ref 6–23)
CALCIUM: 8.6 mg/dL (ref 8.4–10.5)
CHLORIDE: 110 mmol/L (ref 96–112)
CO2: 20 mmol/L (ref 19–32)
Creatinine, Ser: 1.82 mg/dL — ABNORMAL HIGH (ref 0.50–1.10)
GFR calc Af Amer: 30 mL/min — ABNORMAL LOW (ref >90)
GFR calc non Af Amer: 26 mL/min — ABNORMAL LOW (ref >90)
Glucose, Bld: 115 mg/dL — ABNORMAL HIGH (ref 70–99)
Potassium: 3.9 mmol/L (ref 3.5–5.1)
Sodium: 139 mmol/L (ref 135–145)
Total Protein: 6.3 g/dL (ref 6.0–8.3)

## 2014-05-27 LAB — I-STAT CHEM 8, ED
BUN: 29 mg/dL — ABNORMAL HIGH (ref 6–23)
Calcium, Ion: 1.18 mmol/L (ref 1.13–1.30)
Chloride: 110 mmol/L (ref 96–112)
Creatinine, Ser: 1.8 mg/dL — ABNORMAL HIGH (ref 0.50–1.10)
GLUCOSE: 115 mg/dL — AB (ref 70–99)
HCT: 28 % — ABNORMAL LOW (ref 36.0–46.0)
HEMOGLOBIN: 9.5 g/dL — AB (ref 12.0–15.0)
Potassium: 3.9 mmol/L (ref 3.5–5.1)
Sodium: 141 mmol/L (ref 135–145)
TCO2: 17 mmol/L (ref 0–100)

## 2014-05-27 LAB — URINALYSIS, ROUTINE W REFLEX MICROSCOPIC
Bilirubin Urine: NEGATIVE
GLUCOSE, UA: NEGATIVE mg/dL
Hgb urine dipstick: NEGATIVE
Ketones, ur: NEGATIVE mg/dL
LEUKOCYTES UA: NEGATIVE
NITRITE: NEGATIVE
PH: 5 (ref 5.0–8.0)
Protein, ur: 100 mg/dL — AB
SPECIFIC GRAVITY, URINE: 1.012 (ref 1.005–1.030)
Urobilinogen, UA: 0.2 mg/dL (ref 0.0–1.0)

## 2014-05-27 LAB — CBC
HEMATOCRIT: 26.7 % — AB (ref 36.0–46.0)
HEMOGLOBIN: 8.6 g/dL — AB (ref 12.0–15.0)
MCH: 30.1 pg (ref 26.0–34.0)
MCHC: 32.2 g/dL (ref 30.0–36.0)
MCV: 93.4 fL (ref 78.0–100.0)
Platelets: 192 10*3/uL (ref 150–400)
RBC: 2.86 MIL/uL — ABNORMAL LOW (ref 3.87–5.11)
RDW: 14.8 % (ref 11.5–15.5)
WBC: 5.3 10*3/uL (ref 4.0–10.5)

## 2014-05-27 LAB — I-STAT TROPONIN, ED: TROPONIN I, POC: 0.03 ng/mL (ref 0.00–0.08)

## 2014-05-27 LAB — RAPID URINE DRUG SCREEN, HOSP PERFORMED
Amphetamines: NOT DETECTED
Barbiturates: NOT DETECTED
Benzodiazepines: NOT DETECTED
Cocaine: NOT DETECTED
Opiates: NOT DETECTED
TETRAHYDROCANNABINOL: NOT DETECTED

## 2014-05-27 LAB — PROTIME-INR
INR: 1.07 (ref 0.00–1.49)
Prothrombin Time: 14 seconds (ref 11.6–15.2)

## 2014-05-27 LAB — GLUCOSE, CAPILLARY: Glucose-Capillary: 105 mg/dL — ABNORMAL HIGH (ref 70–99)

## 2014-05-27 LAB — ETHANOL: Alcohol, Ethyl (B): <5 mg/dL (ref 0–9)

## 2014-05-27 LAB — APTT: APTT: 25 s (ref 24–37)

## 2014-05-27 MED ORDER — INSULIN ASPART 100 UNIT/ML ~~LOC~~ SOLN
0.0000 [IU] | Freq: Three times a day (TID) | SUBCUTANEOUS | Status: DC
  Administered 2014-05-28 – 2014-05-29 (×2): 1 [IU] via SUBCUTANEOUS
  Administered 2014-05-29: 2 [IU] via SUBCUTANEOUS

## 2014-05-27 MED ORDER — ASPIRIN 325 MG PO TABS: 325.0000 mg | ORAL_TABLET | Freq: Every day | ORAL | Status: DC

## 2014-05-27 MED ORDER — HYDRALAZINE HCL 20 MG/ML IJ SOLN
10.0000 mg | Freq: Once | INTRAMUSCULAR | Status: AC
  Administered 2014-05-27: 10 mg via INTRAVENOUS
  Filled 2014-05-27: qty 1

## 2014-05-27 MED ORDER — SODIUM CHLORIDE 0.9 % IV SOLN
INTRAVENOUS | Status: DC
  Administered 2014-05-27: 23:00:00 via INTRAVENOUS

## 2014-05-27 MED ORDER — HEPARIN SODIUM (PORCINE) 5000 UNIT/ML IJ SOLN
5000.0000 [IU] | Freq: Three times a day (TID) | INTRAMUSCULAR | Status: DC
  Administered 2014-05-28 – 2014-05-30 (×7): 5000 [IU] via SUBCUTANEOUS
  Filled 2014-05-27 (×6): qty 1

## 2014-05-27 MED ORDER — ASPIRIN 300 MG RE SUPP: 300.0000 mg | Freq: Every day | RECTAL | Status: DC

## 2014-05-27 MED ORDER — ASPIRIN EC 81 MG PO TBEC
81.0000 mg | DELAYED_RELEASE_TABLET | Freq: Every day | ORAL | Status: DC
  Administered 2014-05-28: 81 mg via ORAL
  Filled 2014-05-27: qty 1

## 2014-05-27 MED ORDER — STROKE: EARLY STAGES OF RECOVERY BOOK
Freq: Once | Status: AC
  Administered 2014-05-27: 23:00:00

## 2014-05-27 NOTE — Code Documentation (Signed)
Patient lives alone, on good days she normally sits in her kitchen watching tv, if she is feeling poorly she stays in bed.  Per her niece she had a normal phone conversation at 26 today, then when she arrived at the house  She found her aunt sitting on the side of her bed holding her right arm and having difficulty speaking.  EMS was called, they assessed that she was flaccid on the right arm and had slurred speech.  Code Stroke was called in route.  BP 230/100.  Stat labs and CT done.  Upon arrival patient aphasic and dysarthric.  NIHSS 7, no motor deficits.  Dr Armida Sans at bedside to assess patient.  No TPA given since LKW time uncertain since she initially had right side weakness in addition to the speech deficits.  Family at bedside.

## 2014-05-27 NOTE — Consult Note (Signed)
Referring Physician: ED    Chief Complaint: code stroke, dysarthria, right hemiparesis, right face weakness  HPI:                                                                                                                                         Chrsitina [name] is an 74 y.o. female with a past medical history significant for HTN, DM, brought in by EMS as a code stroke due to acute onset of the above stated symptoms. Patient reportedly had a normal conversation with her daughter at 5 pm today, but when she got home 30 minutes later patient was having slurring speech and EMS was summoned. EMS indicated that upon initial assessment patient had profound weakness of the right side, right face, and slurred speech. Further, had SBP>230. Initial NIHSS 7 CT brain was personally reviewed and revealed no acute abnormality. She denies HA, vertigo, double vision, numbness-tingling, or visual disturbances.   Date last known well: 05/27/14 Time last known well: unclear tPA Given: no, unclear when patient was last normal NIHSS: 7   Past Medical History  Diagnosis Date  . Arthritis   . Diabetes mellitus without complication   . Hypertension     Past Surgical History  Procedure Laterality Date  . Abdominal surgery    . Joint replacement      No family history on file. Social History:  reports that she has quit smoking. She does not have any smokeless tobacco history on file. She reports that she does not drink alcohol or use illicit drugs.  Allergies: No Known Allergies  Medications:                                                                                                                           I have reviewed the patient's current medications.  ROS:  History obtained from chart review  General ROS: negative for - chills, fatigue, fever, night  sweats, weight gain or weight loss Psychological ROS: negative for - behavioral disorder, hallucinations, memory difficulties, mood swings or suicidal ideation Ophthalmic ROS: negative for - blurry vision, double vision, eye pain or loss of vision ENT ROS: negative for - epistaxis, nasal discharge, oral lesions, sore throat, tinnitus or vertigo Allergy and Immunology ROS: negative for - hives or itchy/watery eyes Hematological and Lymphatic ROS: negative for - bleeding problems, bruising or swollen lymph nodes Endocrine ROS: negative for - galactorrhea, hair pattern changes, polydipsia/polyuria or temperature intolerance Respiratory ROS: negative for - cough, hemoptysis, shortness of breath or wheezing Cardiovascular ROS: negative for - chest pain, dyspnea on exertion, edema or irregular heartbeat Gastrointestinal ROS: negative for - abdominal pain, diarrhea, hematemesis, nausea/vomiting or stool incontinence Genito-Urinary ROS: negative for - dysuria, hematuria, incontinence or urinary frequency/urgency Musculoskeletal ROS: negative for - joint swelling Neurological ROS: as noted in HPI Dermatological ROS: negative for rash and skin lesion changes   Physical exam: pleasant female in no apparent distress. BP 140/100 P 72 R 17 afebrile Head: normocephalic. Neck: supple, no bruits, no JVD. Cardiac: no murmurs. Lungs: clear. Abdomen: soft, no tender, no mass. Extremities: no edema. Skin: no rash  Neurologic Examination:                                                                                                      General: Mental Status: Alert, oriented, thought content appropriate.  Dysarthric. Able to follow 3 step commands without difficulty. Cranial Nerves: II: Discs flat bilaterally; Visual fields grossly normal, pupils equal, round, reactive to light and accommodation III,IV, VI: ptosis not present, extra-ocular motions intact bilaterally V,VII: smile symmetric, facial light  touch sensation normal bilaterally VIII: hearing normal bilaterally IX,X: uvula rises symmetrically XI: bilateral shoulder shrug XII: midline tongue extension without atrophy or fasciculations Motor: Right : Upper extremity   5/5    Left:     Upper extremity   5/5  Lower extremity   5/5     Lower extremity   5/5 Tone and bulk:normal tone throughout; no atrophy noted Sensory: Pinprick and light touch intact throughout, bilaterally Deep Tendon Reflexes:  1+ all over Plantars: Right: downgoing   Left: downgoing Cerebellar: normal finger-to-nose,  normal heel-to-shin test Gait:  Unable to test due to safety reasons. CV: pulses palpable throughout    Results for orders placed or performed during the hospital encounter of 05/27/14 (from the past 48 hour(s))  Protime-INR     Status: None   Collection Time: 05/27/14  6:27 PM  Result Value Ref Range   Prothrombin Time 14.0 11.6 - 15.2 seconds   INR 1.07 0.00 - 1.49  APTT     Status: None   Collection Time: 05/27/14  6:27 PM  Result Value Ref Range   aPTT 25 24 - 37 seconds  CBC     Status: Abnormal   Collection Time: 05/27/14  6:27 PM  Result Value Ref Range   WBC 5.3 4.0 - 10.5 K/uL  RBC 2.86 (L) 3.87 - 5.11 MIL/uL   Hemoglobin 8.6 (L) 12.0 - 15.0 g/dL   HCT 26.7 (L) 36.0 - 46.0 %   MCV 93.4 78.0 - 100.0 fL   MCH 30.1 26.0 - 34.0 pg   MCHC 32.2 30.0 - 36.0 g/dL   RDW 14.8 11.5 - 15.5 %   Platelets 192 150 - 400 K/uL  Differential     Status: None   Collection Time: 05/27/14  6:27 PM  Result Value Ref Range   Neutrophils Relative % 61 43 - 77 %   Neutro Abs 3.3 1.7 - 7.7 K/uL   Lymphocytes Relative 28 12 - 46 %   Lymphs Abs 1.5 0.7 - 4.0 K/uL   Monocytes Relative 6 3 - 12 %   Monocytes Absolute 0.3 0.1 - 1.0 K/uL   Eosinophils Relative 5 0 - 5 %   Eosinophils Absolute 0.2 0.0 - 0.7 K/uL   Basophils Relative 0 0 - 1 %   Basophils Absolute 0.0 0.0 - 0.1 K/uL  I-Stat Troponin, ED (not at [facility])     Status: None    Collection Time: 05/27/14  6:34 PM  Result Value Ref Range   Troponin i, poc 0.03 0.00 - 0.08 ng/mL   Comment 3            Comment: Due to the release kinetics of cTnI, a negative result within the first hours of the onset of symptoms does not rule out myocardial infarction with certainty. If myocardial infarction is still suspected, repeat the test at appropriate intervals.   I-Stat Chem 8, ED     Status: Abnormal   Collection Time: 05/27/14  6:35 PM  Result Value Ref Range   Sodium 141 135 - 145 mmol/L   Potassium 3.9 3.5 - 5.1 mmol/L   Chloride 110 96 - 112 mmol/L   BUN 29 (H) 6 - 23 mg/dL   Creatinine, Ser 1.80 (H) 0.50 - 1.10 mg/dL   Glucose, Bld 115 (H) 70 - 99 mg/dL   Calcium, Ion 1.18 1.13 - 1.30 mmol/L   TCO2 17 0 - 100 mmol/L   Hemoglobin 9.5 (L) 12.0 - 15.0 g/dL   HCT 28.0 (L) 36.0 - 46.0 %   Ct Head Wo Contrast  05/27/2014   CLINICAL DATA:  Code stroke. Right-sided weakness and speech difficulties.  EXAM: CT HEAD WITHOUT CONTRAST  TECHNIQUE: Contiguous axial images were obtained from the base of the skull through the vertex without intravenous contrast.  COMPARISON:  None.  FINDINGS: Age-related atrophy and moderate confluent periventricular white matter change. No evidence of territorial infarct. No intracranial hemorrhage, mass effect, or midline shift. No hydrocephalus. The basilar cisterns are patent. No intracranial fluid collection. Calvarium is intact. Included paranasal sinuses and mastoid air cells are well aerated.  IMPRESSION: Moderate chronic small vessel ischemic change. No CT findings of acute infarct. No hemorrhage.  These results were called by telephone at the time of interpretation on 05/27/2014 at 6:42 pm to Dr. Armida Sans , who verbally acknowledged these results.   Electronically Signed   By: Jeb Levering M.D.   On: 05/27/2014 18:42    Assessment: 74 y.o. female brought in with acute onset dysarthria, right hemiparesis, right face weakness Initial NIHSS 7,  CT brain without acute abnormality. By the time we examined the patient she had no focal motor deficits and her main impairment was speech and language. Family is not available, it is not entirely clear if patient was really normal at  5 pm as nobody was physically present at that time and EMS report that when they got to the scene the patient was already flaccid in the side. Therefore, I don't feel comfortable treating patient with thrombolytics. Admit to medicine. Complete stroke work up. Aspirin after passing swallowing evaluation. Stroke team will follow up in the morning.  Stroke Risk Factors -age, HTN, DM   Plan: 1. HgbA1c, fasting lipid panel 2. MRI, MRA  of the brain without contrast 3. Echocardiogram 4. Carotid dopplers 5. Prophylactic therapy-aspirin 6. Risk factor modification 7. Telemetry monitoring 8. Frequent neuro checks 9. PT/OT SLP   Dorian Pod ,MD [facility] [phone]  05/27/2014, 6:50 PM

## 2014-05-27 NOTE — ED Provider Notes (Signed)
CSN: [id number]     Arrival date & time 05/27/14  1824 History   First MD Initiated Contact with Patient 05/27/14 1838     Chief Complaint  Patient presents with  . Code Stroke     (Consider location/radiation/quality/duration/timing/severity/associated sxs/prior Treatment) HPI 74 year old female presents by EMS as a code stroke. Patient lives alone and reportedly had a normal conversation and sounded normal at 1700. At 1730 her daughter saw the patient and noticed she was different occluding slurred speech. EMS was called. EMS reports right sided paralysis that is currently resolved. She was noted to be hypertensive. No family is around for further history. Patient has a reported history of hypertension and diabetes. Patient has slurred speech and seems to be able to understand questions but does not respond appropriately due to the slurring.  Past Medical History  Diagnosis Date  . Arthritis   . Diabetes mellitus without complication   . Hypertension    Past Surgical History  Procedure Laterality Date  . Abdominal surgery    . Joint replacement     No family history on file. History  Substance Use Topics  . Smoking status: Former Research scientist (life sciences)  . Smokeless tobacco: Not on file  . Alcohol Use: No   OB History    No data available     Review of Systems  Unable to perform ROS: Acuity of condition      Allergies  Review of patient's allergies indicates no known allergies.  Home Medications   Prior to Admission medications   Medication Sig Start Date End Date Taking? Authorizing Provider  aspirin EC 81 MG tablet Take 81 mg by mouth daily.    Historical Provider, MD  benazepril (LOTENSIN) 40 MG tablet Take 1 tablet by mouth daily. 10/13/13   Historical Provider, MD  benzonatate (TESSALON) 100 MG capsule Take 1 capsule (100 mg total) by mouth every 8 (eight) hours. 12/02/13   Tanna Furry, MD  carvedilol (COREG) 3.125 MG tablet Take 1 tablet by mouth 2 (two) times daily. 10/14/13    Historical Provider, MD  furosemide (LASIX) 40 MG tablet Take 1 tablet by mouth daily. 10/13/13   Historical Provider, MD  gabapentin (NEURONTIN) 100 MG capsule Take 1 capsule (100 mg total) by mouth 3 (three) times daily. 12/02/13   Tanna Furry, MD  hydrALAZINE (APRESOLINE) 25 MG tablet Take 1 tablet by mouth 2 (two) times daily. 10/13/13   Historical Provider, MD  HYDROcodone-acetaminophen (NORCO/VICODIN) 5-325 MG per tablet Take 1 tablet by mouth at bedtime. 12/02/13   Tanna Furry, MD  metFORMIN (GLUCOPHAGE) 1000 MG tablet Take 1 tablet by mouth 2 (two) times daily. 10/13/13   Historical Provider, MD  simvastatin (ZOCOR) 40 MG tablet Take 1 tablet by mouth every evening. 10/14/13   Historical Provider, MD  SYMBICORT 160-4.5 MCG/ACT inhaler Inhale 2 puffs into the lungs 2 (two) times daily. 10/13/13   Historical Provider, MD  traMADol (ULTRAM) 50 MG tablet Take 1 tablet by mouth 3 (three) times daily as needed. 08/31/13   Historical Provider, MD   BP 163/94 mmHg  Pulse 61  Temp(Src) 99.1 F (37.3 C) (Oral)  Resp 18  Ht 5\' 6"  (1.676 m)  Wt 198 lb 1.6 oz (89.858 kg)  BMI 31.99 kg/m2  SpO2 97% Physical Exam  Constitutional: She is oriented to person, place, and time. She appears well-developed and well-nourished.  HENT:  Head: Normocephalic and atraumatic.  Right Ear: External ear normal.  Left Ear: External ear normal.  Nose: Nose  normal.  Eyes: EOM are normal. Pupils are equal, round, and reactive to light. Right eye exhibits no discharge. Left eye exhibits no discharge.  Cardiovascular: Normal rate, regular rhythm and normal heart sounds.   Pulmonary/Chest: Effort normal and breath sounds normal.  Abdominal: She exhibits no distension.  Neurological: She is alert and oriented to person, place, and time.  Equal and normal strength in all 4 extremities. No facial droop. Slurred speech present  Skin: Skin is warm and dry.  Vitals reviewed.   ED Course  Procedures (including critical care  time) Labs Review Labs Reviewed  CBC - Abnormal; Notable for the following:    RBC 2.86 (*)    Hemoglobin 8.6 (*)    HCT 26.7 (*)    All other components within normal limits  I-STAT CHEM 8, ED - Abnormal; Notable for the following:    BUN 29 (*)    Creatinine, Ser 1.80 (*)    Glucose, Bld 115 (*)    Hemoglobin 9.5 (*)    HCT 28.0 (*)    All other components within normal limits  PROTIME-INR  APTT  DIFFERENTIAL  ETHANOL  COMPREHENSIVE METABOLIC PANEL  URINE RAPID DRUG SCREEN (HOSP PERFORMED)  URINALYSIS, ROUTINE W REFLEX MICROSCOPIC  I-STAT TROPOININ, ED  I-STAT TROPOININ, ED    Imaging Review Ct Head Wo Contrast  05/27/2014   CLINICAL DATA:  Code stroke. Right-sided weakness and speech difficulties.  EXAM: CT HEAD WITHOUT CONTRAST  TECHNIQUE: Contiguous axial images were obtained from the base of the skull through the vertex without intravenous contrast.  COMPARISON:  None.  FINDINGS: Age-related atrophy and moderate confluent periventricular white matter change. No evidence of territorial infarct. No intracranial hemorrhage, mass effect, or midline shift. No hydrocephalus. The basilar cisterns are patent. No intracranial fluid collection. Calvarium is intact. Included paranasal sinuses and mastoid air cells are well aerated.  IMPRESSION: Moderate chronic small vessel ischemic change. No CT findings of acute infarct. No hemorrhage.  These results were called by telephone at the time of interpretation on 05/27/2014 at 6:42 pm to Dr. Armida Sans , who verbally acknowledged these results.   Electronically Signed   By: Jeb Levering M.D.   On: 05/27/2014 18:42     EKG Interpretation None      MDM   Final diagnoses:  Stroke with cerebral ischemia  Stroke with cerebral ischemia    Patient has no evidence of extremity weakness but is having trouble articulating and speaking. This is apparently new but no family is present. After discussion with neurology, Dr. Armida Sans, the decision  has been made to not give TPA as there is not a clear last seen normal. Is uncertain when the weakness in her arm would've started. Given this, will admit to medicine for further stroke workup. Maintaining airway, no acute respiratory distress.    Sherwood Gambler, MD 05/28/14 [phone]

## 2014-05-27 NOTE — ED Notes (Signed)
Pt arrives via EMS from home. Pt had a phone call with her daughter at 1500 - pt was normal on phone. Pt then was on phone with daughter at 23 and daughter reports that pt had slurred speech. EMS arrived at 1535 and reports slurred speech, rt facial droop and rt arm and leg weakness.

## 2014-05-27 NOTE — ED Notes (Signed)
Family at bedside. 

## 2014-05-27 NOTE — H&P (Signed)
[facility] Admission History and Physical Service Pager: [phone]  Patient [name] Medical record number: [id number] Date of birth: Sep 23, 1940 Age: 74 y.o. Gender: female  Primary Care Provider: Neale Burly, MD Consultants: Neurology Code Status: Full Code Chief Complaint: Stroke  Assessment and Plan: Jody [name] is a 74 y.o. female presenting with new onset dysarthria, right hemiparesis, right face weakness . PMH is significant for HTN, DM, Arthritis, CHF?  Right sided hemiparesis/dysarthria: Symptoms consistent with stroke vs TIA with negative CT head and MRI/MRA brain. Imaging done without contrast 2/2 to elevated creatinine. She does have known risk factors for stroke with DM, and HTN despite negative work up this far. Unsure of the cause at the point, most likely ischemic. Per the family, her motor function and speech have improved greatly as compared to when they first found her but she is not at her baseline.  - admit to telemetry, Dr. Nori Riis attending  - Assessed by Neurology in the ED, will continue to follow and appreciate recs - F/u HgbA1c, fasting Lipid panel -Echocardiogram -PPX aspirin by suppository as she failed nurse swallow study -PT/OT/SLP - Neuro checks q2 hrs for 12 hrs then q4 - Fall precautions  AoCKD III: Cr elevated to 1.82 with GFR 26 today from Cr 1.58 5 months ago. Pt unaware of kidney disease history. UA with no signs of infection but showing some protein. Most likely 2/2 to her DM.  - Continue to monitor and trend BMPs  Uncertain history of CHF- Pt and POA uncertain if she has CHF. Pt endorses baseline orthopnea with occasional swelling of BLE making history of CHF likely. But given normal lung exam with no LE edema noted unlikely to be having an acute exacerbation.. She currently takes lasix 40 mg and coreg 3.125 BID - KVO - ECHO pending   HTN: Will allow for permissive HTN in the first 24-48 hours and hold  home antihypertensives and keep PRN hydralazine to treat per Stroke protocol  HLD - Continue home simvastatin after swallow study in AM 4/14 - f/u Am lipid panel  Asthma: stable, no wheezing on exam.  - Continue home symbicort  DM: Unknown if controlled or not.  - Follow A1c - SSI while NPO - hold home metformin; may need to discontinue her metformin if her kidney function doesn't improve  FEN/GI:  -NPO until AM swallow eval  by SLP as she failed nurse bedside swallow eval - KVO as she has questionable history of CHF with unknown cardiac function  Prophylaxis: subcutaneous heparin   Disposition: Pending clinical improvement  History of Present Illness: Jody [name] is a 73 y.o. female presenting with dysarthria right sided weakness that was first noted this afternoon at approximately 5:15 PM. Her niece who is also her POA usually check in on her after work and today she called her at around 5:10 pm. At that time she was noted to be her usual self, talkative and discussing dinner plans. She then went to see her and arrived at around 5:30. At that time she noted her cousin also heading to see her but in an rush because when the cousin called her at around 5:15 she was noted to be very altered and unable to speak on the telephone. When the POA and her cousin reached the patient she was noted to be very confused, apparently unable to recognize her and unable to speak clearly. She also noted a right facial droop and that her right arm was drawn  up. She then walked with her to the kitchen and she was able to ambulate without difficulty At base line she does have right upper extremity weakness attributed to arthritis with inability to raise her arm much above her head. But is usually able to speak clearly with no difficulty. Per the POA she does not have a history of stroke, for clotting events but does have a history of MI.  Denies fall or trauma associated, but did have a fall aproximately 3  weeks ago secondary to "leg giving out" no trauma at that time. Denies SOB, chest pain or headache  Review Of Systems: Per HPI otherwise her 12 point review of systems was performed and was unremarkable.  Patient Active Problem List   Diagnosis Date Noted  . CVA (cerebral infarction) 05/27/2014   Past Medical History: Past Medical History  Diagnosis Date  . Arthritis   . Diabetes mellitus without complication   . Hypertension    Past Surgical History: Past Surgical History  Procedure Laterality Date  . Abdominal surgery    . Joint replacement     Social History: History  Substance Use Topics  . Smoking status: Former Research scientist (life sciences)  . Smokeless tobacco: Not on file  . Alcohol Use: No   Additional social history: Remote history of etoh and tobacco use, denies drug use Please also refer to relevant sections of EMR.  Family History: No family history on file. Allergies and Medications: No Known Allergies No current facility-administered medications on file prior to encounter.   Current Outpatient Prescriptions on File Prior to Encounter  Medication Sig Dispense Refill  . carvedilol (COREG) 3.125 MG tablet Take 1 tablet by mouth 2 (two) times daily.    . furosemide (LASIX) 40 MG tablet Take 40 mg by mouth daily.     [name] gabapentin (NEURONTIN) 100 MG capsule Take 1 capsule (100 mg total) by mouth 3 (three) times daily. 90 capsule 1  . hydrALAZINE (APRESOLINE) 25 MG tablet Take 1 tablet by mouth 2 (two) times daily.    . metFORMIN (GLUCOPHAGE) 1000 MG tablet Take 1 tablet by mouth 2 (two) times daily.    . simvastatin (ZOCOR) 40 MG tablet Take 40 mg by mouth every evening.     . SYMBICORT 160-4.5 MCG/ACT inhaler Inhale 2 puffs into the lungs 2 (two) times daily.    . traMADol (ULTRAM) 50 MG tablet Take 1 tablet by mouth 3 (three) times daily as needed.    . benzonatate (TESSALON) 100 MG capsule Take 1 capsule (100 mg total) by mouth every 8 (eight) hours. (Patient not taking: Reported  on 05/27/2014) 21 capsule 0  . HYDROcodone-acetaminophen (NORCO/VICODIN) 5-325 MG per tablet Take 1 tablet by mouth at bedtime. (Patient not taking: Reported on 05/27/2014) 10 tablet 0    Objective: BP 190/77 mmHg  Pulse 66  Temp(Src) 98.2 F (36.8 C) (Oral)  Resp 20  Ht 5\' 6"  (1.676 m)  Wt 198 lb 1.6 oz (89.858 kg)  BMI 31.99 kg/m2  SpO2 100% Exam: General: NAD, lying comfortably in bed HEENT: normal cephalic, non traumatic, right sided mouth droop wth flattened right nasolabial fold Cardiovascular: RRR no murmurs Respiratory: CTAB, no wheezes Abdomen: soft non-tender, non distended, no organomegally Extremities: No edema,  2+ DP pulses Skin: warm well perfused, no rashes or lesions Neuro:  Alert and oriented x4, + dysarthria. Negative babinski b/l  Cranial nerves II - XII: II - Visual field intact to confrontation. III, IV, VI - Extraocular movements difficulty to assess  2/2 pt compliance V - Facial sensation intact bilaterally. VII - Upper and lower facial movement intact bilaterally. VIII - Hearing & vestibular intact bilaterally. X - Palate elevates symmetrically. XI - Chin turning & shoulder shrug intact bilaterally. XII - Tongue protrusion intact.  4/5 right upper extremity strength, 5/5 left upper extremity strength, normal bilateral sensation 4/5 right lower extremity strength 4/5 dorsiflexion and plantar flexion, 5/5 left lower extremity strength 5/5 dorsiflexion and plantar flexion, normal bilateral sensation MSK: holding right arm in a flexed position   .  Labs and Imaging: CBC BMET   Recent Labs Lab 05/27/14 1827 05/27/14 1835  WBC 5.3  --   HGB 8.6* 9.5*  HCT 26.7* 28.0*  PLT 192  --     Recent Labs Lab 05/27/14 1827 05/27/14 1835  NA 139 141  K 3.9 3.9  CL 110 110  CO2 20  --   BUN 29* 29*  CREATININE 1.82* 1.80*  GLUCOSE 115* 115*  CALCIUM 8.6  --      4/13 MRI/MRA head  MRI HEAD FINDINGS  The diffusion-weighted images demonstrate no  evidence for acute or subacute infarction. Confluent periventricular white matter changes are evident bilaterally. There are remote lacunar infarcts of the basal ganglia bilaterally. No significant cortical infarct is present. The ventricles are of normal size.  No acute hemorrhage or mass lesion is present. Flow is present in the major intracranial arteries. The globes and orbits are intact the paranasal sinuses and mastoid air cells are clear. No significant extraaxial fluid collection is present. Midline structures are within normal limits the brain. Advanced degenerative changes are present at the C4-5 level.  MRA HEAD FINDINGS  Atherosclerotic irregularity is present within the internal carotid arteries bilaterally. There is mild moderate narrowing of the mid left A1 segment. The anterior communicating artery is patent. The M1 segments are within normal limits bilaterally. The MCA bifurcations are normal. There is mild attenuation of distal ACA and MCA branch vessels bilaterally. Tortuosity is noted in the cervical internal carotid arteries.  The vertebral arteries are codominant. The left PICA origin is visualized and normal. The right AICA is dominant. The basilar artery is tortuous without focal stenosis. Both posterior cerebral arteries originate from the basilar tip. There is some attenuation of distal PCA branch vessels bilaterally.\  4/13 CT head w/o contrast  IMPRESSION: Moderate chronic small vessel ischemic change. No CT findings of acute infarct. No hemorrhage.  These results were called by telephone at the time of interpretation on 05/27/2014 at 6:42 pm to Dr. Armida Sans , who verbally acknowledged these results.   Veatrice Bourbon, MD 05/27/2014, 9:11 PM PGY-1, [place] Intern pager: [phone], text pages welcome  Upper Level Addendum:  I have seen and evaluated this patient along with Dr. Mikle Bosworth and reviewed the above note,  making necessary revisions in [facility].   Clearance Coots, MD Family Medicine PGY-2

## 2014-05-28 DIAGNOSIS — E785 Hyperlipidemia, unspecified: Secondary | ICD-10-CM | POA: Insufficient documentation

## 2014-05-28 DIAGNOSIS — I10 Essential (primary) hypertension: Secondary | ICD-10-CM | POA: Insufficient documentation

## 2014-05-28 DIAGNOSIS — E1159 Type 2 diabetes mellitus with other circulatory complications: Secondary | ICD-10-CM | POA: Insufficient documentation

## 2014-05-28 DIAGNOSIS — G451 Carotid artery syndrome (hemispheric): Secondary | ICD-10-CM

## 2014-05-28 DIAGNOSIS — I639 Cerebral infarction, unspecified: Secondary | ICD-10-CM | POA: Insufficient documentation

## 2014-05-28 LAB — BASIC METABOLIC PANEL
Anion gap: 12 (ref 5–15)
BUN: 22 mg/dL (ref 6–23)
CO2: 19 mmol/L (ref 19–32)
Calcium: 8.8 mg/dL (ref 8.4–10.5)
Chloride: 113 mmol/L — ABNORMAL HIGH (ref 96–112)
Creatinine, Ser: 1.56 mg/dL — ABNORMAL HIGH (ref 0.50–1.10)
GFR calc non Af Amer: 31 mL/min — ABNORMAL LOW (ref >90)
GFR, EST AFRICAN AMERICAN: 36 mL/min — AB (ref >90)
Glucose, Bld: 112 mg/dL — ABNORMAL HIGH (ref 70–99)
POTASSIUM: 3.9 mmol/L (ref 3.5–5.1)
SODIUM: 144 mmol/L (ref 135–145)

## 2014-05-28 LAB — TSH: TSH: 1.247 u[IU]/mL (ref 0.350–4.500)

## 2014-05-28 LAB — GLUCOSE, CAPILLARY
GLUCOSE-CAPILLARY: 119 mg/dL — AB (ref 70–99)
GLUCOSE-CAPILLARY: 134 mg/dL — AB (ref 70–99)
Glucose-Capillary: 142 mg/dL — ABNORMAL HIGH (ref 70–99)

## 2014-05-28 LAB — LIPID PANEL
Cholesterol: 153 mg/dL (ref 0–200)
HDL: 45 mg/dL (ref >39)
LDL Cholesterol: 84 mg/dL (ref 0–99)
Total CHOL/HDL Ratio: 3.4 RATIO
Triglycerides: 121 mg/dL (ref <150)
VLDL: 24 mg/dL (ref 0–40)

## 2014-05-28 MED ORDER — CARVEDILOL 3.125 MG PO TABS
3.1250 mg | ORAL_TABLET | Freq: Two times a day (BID) | ORAL | Status: DC
  Administered 2014-05-28 – 2014-05-30 (×4): 3.125 mg via ORAL
  Filled 2014-05-28 (×4): qty 1

## 2014-05-28 MED ORDER — HYDRALAZINE HCL 25 MG PO TABS
25.0000 mg | ORAL_TABLET | Freq: Two times a day (BID) | ORAL | Status: DC
  Administered 2014-05-28 (×2): 25 mg via ORAL
  Filled 2014-05-28 (×2): qty 1

## 2014-05-28 MED ORDER — SODIUM CHLORIDE 0.9 % IV SOLN
INTRAVENOUS | Status: DC
  Administered 2014-05-28: 09:00:00 via INTRAVENOUS

## 2014-05-28 MED ORDER — ATORVASTATIN CALCIUM 40 MG PO TABS
40.0000 mg | ORAL_TABLET | Freq: Every day | ORAL | Status: DC
  Administered 2014-05-28 – 2014-05-29 (×2): 40 mg via ORAL
  Filled 2014-05-28 (×2): qty 1

## 2014-05-28 MED ORDER — GABAPENTIN 100 MG PO CAPS
100.0000 mg | ORAL_CAPSULE | Freq: Three times a day (TID) | ORAL | Status: DC
  Administered 2014-05-28 – 2014-05-30 (×6): 100 mg via ORAL
  Filled 2014-05-28 (×6): qty 1

## 2014-05-28 MED ORDER — FUROSEMIDE 40 MG PO TABS
40.0000 mg | ORAL_TABLET | Freq: Every day | ORAL | Status: DC
  Administered 2014-05-28 – 2014-05-30 (×3): 40 mg via ORAL
  Filled 2014-05-28 (×3): qty 1

## 2014-05-28 NOTE — Progress Notes (Signed)
PT Cancellation Note  Patient Details [name] MRN: [id number] DOB: [birth year]   Cancelled Treatment:    Reason Eval/Treat Not Completed: Patient not medically ready Order acknowledged. Patient remains on bed rest orders. Will hold at this time. Please update activity orders when patient is medically ready for PT evaluation.  Ellouise Newer 05/28/2014, 8:17 AM Elayne Snare, [place]

## 2014-05-28 NOTE — Progress Notes (Signed)
CARE MANAGEMENT NOTE 05/28/2014  Patient:  Jody [name], Jody [name]   Account Number:  [id number]  Date Initiated:  05/28/2014  Documentation initiated by:  Lorne Skeens  Subjective/Objective Assessment:   Patient was admitted with dysarthria, weakness.  Lives at home alone     Action/Plan:   Will follow for discharge needs pending PT/OT evals and physician orders.   Anticipated DC Date:     Anticipated DC Plan:           Choice offered to / List presented to:             Status of service:  In process, will continue to follow Medicare Important Message given?   (If response is "NO", the following Medicare IM given date fields will be blank) Date Medicare IM given:   Medicare IM given by:   Date Additional Medicare IM given:   Additional Medicare IM given by:    Discharge Disposition:    Per UR Regulation:  Reviewed for med. necessity/level of care/duration of stay  If discussed at [place] of Stay Meetings, dates discussed:    Comments:

## 2014-05-28 NOTE — Progress Notes (Signed)
Family Medicine Teaching Service Daily Progress Note Intern Pager: [phone]  Patient [name] Medical record number: [id number] Date of birth: 1941/01/23 Age: 74 y.o. Gender: female  Primary Care Provider: Neale Burly, MD Consultants: neurology Code Status: Full  Pt Overview and Major Events to Date:  4/14: admission for concern for stroke  Assessment and Plan:  Right sided hemiparesis/dysarthria: Symptoms consistent with stroke vs TIA with negative CT head and MRI/MRA brain. Imaging done without contrast 2/2 to elevated creatinine. She does have known risk factors for stroke with DM, and HTN despite negative work up this far. Unsure of the cause at the point, most likely ischemic. Per the family, her motor function and speech have improved greatly as compared to when they first found her but she is not at her baseline.  - continue tele,erty - Assessed by Neurology in the ED, will continue to follow and appreciate recs - F/u HgbA1c, fasting Lipid panel wnl -f/u echocardiogram -PPX aspirin by suppository as she failed nurse swallow study -PT/OT/SLP - Neuro checks q2 hrs for 12 hrs then q4 - Fall precautions  AoCKD III: Cr elevated to 1.82 with GFR 26 today from Cr 1.58 5 months ago. Pt unaware of kidney disease history. UA with no signs of infection but showing some protein. Most likely 2/2 to her DM.  - Continue to monitor and trend BMPs  Uncertain history of CHF- Pt and POA uncertain if she has CHF. Pt endorses baseline orthopnea with occasional swelling of BLE making history of CHF likely. But given normal lung exam with no LE edema noted unlikely to be having an acute exacerbation.. She currently takes lasix 40 mg and coreg 3.125 BID - ECHO pending   HTN: Will allow for permissive HTN in the first 24-48 hours  - Hold home antihypertensives \ - PRN hydralazine to treat per Stroke protocol  HLD - Continue home simvastatin after swallow study in AM 4/14 - f/u Am  lipid panel  Asthma: stable, no wheezing on exam.  - Continue home symbicort  DM: Unknown if controlled or not.  - Follow A1c - SSI while NPO - hold home metformin; may need to discontinue her metformin if her kidney function doesn't improve  FEN/GI:  -NPO until AM swallow eval by SLP as she failed nurse bedside swallow eval - KVO as she has questionable history of CHF with unknown cardiac function  Prophylaxis: subcutaneous heparin   Disposition: Pending clinical improvement  Subjective:  Feeling improved weakness and speech. Wants to eat  Objective: Temp:  [98.2 F (36.8 C)-99.3 F (37.4 C)] 98.8 F (37.1 C) (04/14 0700) Pulse Rate:  [56-69] 63 (04/14 0700) Resp:  [18-21] 20 (04/14 0700) BP: (146-203)/(66-156) 172/72 mmHg (04/14 0700) SpO2:  [97 %-100 %] 98 % (04/14 0700) Weight:  [198 lb 1.6 oz (89.858 kg)] 198 lb 1.6 oz (89.858 kg) (04/13 1901) Physical Exam: General: NAD, lying comfortably in bed HEENT: normal cephalic, non traumatic, resolved right facial droop Cardiovascular: RRR no murmurs Respiratory: CTAB, no wheezes Abdomen: soft non-tender, non distended, no organomegally Extremities: No edema, Skin: warm well perfused, no rashes or lesions Neuro: Alert and oriented x4, improved CN II-XII without deficits, no focal deficits in sensation or motor function, . Improving dysarthria Negative babinski b/l  Cranial nerves II - XII: II - Visual field intact to confrontation. III, IV, VI - Extraocular movements intact. V - Facial sensation intact bilaterally. VII - Upper and lower facial movement intact bilaterally. VIII - Hearing & vestibular  intact bilaterally. X - Palate elevates symmetrically. XI - Chin turning & shoulder shrug intact bilaterally. XII - Tongue protrusion intact.  5/5 right upper extremity strength, 5/5 left upper extremity strength, normal bilateral sensation 4/5 right lower extremity strength, dorsiflexion and plantar flexion limited 2/2  limited 2/2 pt compliance with exam, 5/5 left lower extremity strength, dorsiflexion and plantar flexion limited 2/2 pt compliance with exa, normal bilateral sensation MSK: no longer holding right arm in flexed position  Laboratory:  Recent Labs Lab 05/27/14 1827 05/27/14 1835  WBC 5.3  --   HGB 8.6* 9.5*  HCT 26.7* 28.0*  PLT 192  --     Recent Labs Lab 05/27/14 1827 05/27/14 1835  NA 139 141  K 3.9 3.9  CL 110 110  CO2 20  --   BUN 29* 29*  CREATININE 1.82* 1.80*  CALCIUM 8.6  --   PROT 6.3  --   BILITOT 0.7  --   ALKPHOS 69  --   ALT 13  --   AST 16  --   GLUCOSE 115* 115*      Imaging/Diagnostic Tests: 4/13  MRI HEAD FINDINGS  The diffusion-weighted images demonstrate no evidence for acute or subacute infarction. Confluent periventricular white matter changes are evident bilaterally. There are remote lacunar infarcts of the basal ganglia bilaterally. No significant cortical infarct is present. The ventricles are of normal size.  No acute hemorrhage or mass lesion is present. Flow is present in the major intracranial arteries. The globes and orbits are intact the paranasal sinuses and mastoid air cells are clear. No significant extraaxial fluid collection is present. Midline structures are within normal limits the brain. Advanced degenerative changes are present at the C4-5 level.  MRA HEAD FINDINGS  Atherosclerotic irregularity is present within the internal carotid arteries bilaterally. There is mild moderate narrowing of the mid left A1 segment. The anterior communicating artery is patent. The M1 segments are within normal limits bilaterally. The MCA bifurcations are normal. There is mild attenuation of distal ACA and MCA branch vessels bilaterally. Tortuosity is noted in the cervical internal carotid arteries.  The vertebral arteries are codominant. The left PICA origin is visualized and normal. The right AICA is dominant. The  basilar artery is tortuous without focal stenosis. Both posterior cerebral arteries originate from the basilar tip. There is some attenuation of distal PCA branch vessels bilaterally.  CT head w/o contrast  IMPRESSION: Moderate chronic small vessel ischemic change. No CT findings of acute infarct. No hemorrhage.  These results were called by telephone at the time of interpretation on 05/27/2014 at 6:42 pm to Dr. Armida Sans , who verbally acknowledged these results.   Veatrice Bourbon, MD 05/28/2014, 9:38 AM PGY-1 [place] Intern pager: [phone], text pages welcome

## 2014-05-28 NOTE — Progress Notes (Signed)
Pt admitted to room 4N02 from ED. Pt is alert, with slurred, incomprehensible speech.  Pt denies any pain.  Family at bedside. MD at bedside.  Safety measures in place. Will continue to monitor.    Fredrich Romans, RN

## 2014-05-28 NOTE — Progress Notes (Signed)
Occupational Therapy Evaluation Patient Details Name: Britny Pribnow MRN: [id number] DOB: 04/12/1940 Today's Date: 05/28/2014    History of Present Illness 74 y.o. female with a past medical history significant for HTN, DM, brought in by EMS as a code stroke due to acute onset of dysarthria, right hemiparesis, right face weakness. NIHSS 7 on admission. MRI (-).   Clinical Impression   PTA, pt mod I with mobility and ADL. Pt overall S with mobility and set up with ADL. Pt with apparent cognitive deficits. At this time, recommend pt D/C home with 24/7 S initially and follow up with [place]. Will follow acutely to address established goals and facilitate safe D/C home.     Follow Up Recommendations  Home health OT;Supervision/Assistance - 24 hour    Equipment Recommendations  Tub/shower bench    Recommendations for Other Services       Precautions / Restrictions Precautions Precautions: Fall      Mobility Bed Mobility Overal bed mobility: Modified Independent                Transfers Overall transfer level: Needs assistance Equipment used: Rolling walker (2 wheeled) Transfers: Sit to/from [employer] Transfers Sit to Stand: Supervision Stand pivot transfers: Supervision       General transfer comment: "falls" into chair at baseline Most likely close to baseline   Balance Overall balance assessment: Needs assistance Sitting-balance support: Feet supported Sitting balance-Leahy Scale: Good     Standing balance support: During functional activity Standing balance-Leahy Scale: Fair                              ADL Overall ADL's : Needs assistance/impaired Eating/Feeding: Set up Eating/Feeding Details (indicate cue type and reason): pt with food all over her bed Grooming: Set up;Supervision/safety   Upper Body Bathing: Set up;Supervision/ safety;Sitting   Lower Body Bathing: Supervison/ safety;Set up;Sit to/from stand   Upper Body Dressing  : Minimal assistance   Lower Body Dressing: Min guard;Sit to/from stand   Toilet Transfer: Supervision/safety;Ambulation;RW   Toileting- Clothing Manipulation and Hygiene: Supervision/safety;Sit to/from stand       Functional mobility during ADLs: Supervision/safety;Rolling walker General ADL Comments: Overall pt doing well with ADL. Attmetping to use RUE iduringtasks. R hand is "clumsy". Concerned over pt's decreased safety awareness. When asked what she would do if there was a fire she sated she would put it out. When asked who she needed to call, pt stated "311" and was unable to demonstrate with phone     Vision  wears glasses   Perception     Praxis      Pertinent Vitals/Pain Pain Assessment: No/denies pain     Hand Dominance Right   Extremity/Trunk Assessment Upper Extremity Assessment Upper Extremity Assessment: RUE deficits/detail RUE Deficits / Details: weak RUE. isolated joint movement presetn, however uncoordinated with decreased fine motor skills; attempts to use during funcitonal tasks. unable to lift against gravity with shoulder flexion. Able to compelte full ROM with grvity eleiminated. Improvedment noted with repetitions RUE Sensation:  (appears intact) RUE Coordination: decreased fine motor;decreased gross motor   Lower Extremity Assessment Lower Extremity Assessment: Defer to PT evaluation (L LE "gave out" and caused last fall.)   Cervical / Trunk Assessment Cervical / Trunk Assessment: Normal   Communication Communication Communication: Expressive difficulties   Cognition Arousal/Alertness: Awake/alert Behavior During Therapy: WFL for tasks assessed/performed Overall Cognitive Status: Impaired/Different from baseline  General Comments       Exercises       Shoulder Instructions      Home Living Family/patient expects to be discharged to:: Private residence Living Arrangements: Alone Available Help at Discharge:  Available PRN/intermittently (family checks on her daily) Type of Home: House Home Access: [place]: One level     Bathroom Shower/Tub: [place] unit;Curtain   Bathroom Toilet: Handicapped height Bathroom Accessibility: Yes How Accessible: Accessible via walker Home Equipment: [place] - 2 wheels;Shower seat      Lives With: Alone    Prior Functioning/Environment Level of Independence: Independent with assistive device(s)        Comments:  (used RW in house and cane outside of house since fall 3 week)    OT Diagnosis: Generalized weakness;Cognitive deficits   OT Problem List: Decreased strength;Decreased range of motion;Decreased activity tolerance;Impaired balance (sitting and/or standing);Decreased coordination;Decreased cognition;Decreased safety awareness;Obesity;Impaired UE functional use   OT Treatment/Interventions: Self-care/ADL training;Therapeutic exercise;Neuromuscular education;DME and/or AE instruction;Therapeutic activities;Cognitive remediation/compensation;Patient/family education;Balance training    OT Goals(Current goals can be found in the care plan section) Acute Rehab OT Goals Patient Stated Goal: to go home OT Goal Formulation: With patient Time For Goal Achievement: 06/11/14 Potential to Achieve Goals: Good  OT Frequency: Min 3X/week   Barriers to D/C:            Maurie Boettcher, OTR/L  [phone] 2016-04-22Co-evaluation              End of Session Equipment Utilized During Treatment: Gait belt;Rolling walker Nurse Communication: Mobility status  Activity Tolerance: Patient tolerated treatment well Patient left: in chair;with call bell/phone within reach;with chair alarm set;with family/visitor present   Time: 1310-1350 OT Time Calculation (min): 40 min Charges:  OT General Charges $OT Visit: 1 Procedure OT Evaluation $Initial OT Evaluation Tier I: 1 Procedure OT Treatments $Self Care/Home Management : 8-22  mins G-Codes:    Zylen Wenig,[place] June 05, 2014, 4:40 PM

## 2014-05-28 NOTE — Evaluation (Signed)
Clinical/Bedside Swallow Evaluation Patient Details  [name] MRN: [id number] Date of Birth: 1940/04/19  Today's Date: 05/28/2014 Time: SLP Start Time (ACUTE ONLY): 1035 SLP Stop Time (ACUTE ONLY): 1055 SLP Time Calculation (min) (ACUTE ONLY): 20 min  Past Medical History:  Past Medical History  Diagnosis Date  . Arthritis   . Diabetes mellitus without complication   . Hypertension    Past Surgical History:  Past Surgical History  Procedure Laterality Date  . Abdominal surgery    . Joint replacement     HPI:  74 y.o. female presenting with new onset dysarthria, right hemiparesis, right face weakness . Per MD, symptoms consistent with CVA vs TIA with negative CT and MRI/MRA. Known stroke risk factors include DM, HTN despite negative w/u.    Assessment / Plan / Recommendation Clinical Impression  Patient presents with a mild oral dysphagia characterized by right sided labial and lingual weakness, when combined with missing bottom dentures, results in mild right anterior labial spillage and mild right sided buccal residue, clearing with min cues for lingual sweep. Family to bring dentures which will likely assist with both mastication and labial seal. No overt s/s of aspiration observed. Will initiate diet and f/u briefly for treatment.     Aspiration Risk  Mild    Diet Recommendation Dysphagia 3 (Mechanical Soft);Thin liquid   Liquid Administration via: Cup;Straw Medication Administration: Whole meds with liquid Supervision: Patient able to self feed;Intermittent supervision to cue for compensatory strategies Compensations: Slow rate;Small sips/bites;Check for pocketing;Check for anterior loss Postural Changes and/or Swallow Maneuvers: Seated upright 90 degrees    Other  Recommendations Oral Care Recommendations: Oral care BID   Follow Up Recommendations  Home health SLP;Outpatient SLP    Frequency and Duration min 2x/week  1 week   Pertinent Vitals/Pain n/a    s     Swallow Study    General HPI: 74 y.o. female presenting with new onset dysarthria, right hemiparesis, right face weakness . Per MD, symptoms consistent with CVA vs TIA with negative CT and MRI/MRA. Known stroke risk factors include DM, HTN despite negative w/u.  Type of Study: Bedside swallow evaluation Previous Swallow Assessment: none Diet Prior to this Study: NPO Temperature Spikes Noted: No Respiratory Status: Room air History of Recent Intubation: No Behavior/Cognition: Alert;Cooperative;Pleasant mood Oral Cavity - Dentition: Dentures, top (bottom dentures not available) Self-Feeding Abilities: Able to feed self Patient Positioning: Upright in bed Baseline Vocal Quality: Clear Volitional Cough: Strong Volitional Swallow: Able to elicit    Oral/Motor/Sensory Function Overall Oral Motor/Sensory Function: Impaired Labial ROM: Within Functional Limits Labial Symmetry: Within Functional Limits Labial Strength: Reduced (right) Labial Sensation: Within Functional Limits Lingual ROM: Reduced right Lingual Symmetry: Within Functional Limits Lingual Strength: Reduced Lingual Sensation: Within Functional Limits Facial ROM: Within Functional Limits Facial Symmetry: Within Functional Limits Facial Strength: Within Functional Limits Facial Sensation: Within Functional Limits Velum: Within Functional Limits Mandible: Within Functional Limits   Ice Chips Ice chips: Within functional limits   Thin Liquid Thin Liquid: Impaired Presentation: Cup;Self Fed;Straw Oral Phase Impairments: Reduced labial seal Oral Phase Functional Implications: Right anterior spillage    Nectar Thick Nectar Thick Liquid: Not tested   Honey Thick Honey Thick Liquid: Not tested   Puree Puree: Impaired Presentation: Spoon;Self Fed Oral Phase Impairments: Reduced labial seal Oral Phase Functional Implications: Right anterior spillage;Right lateral sulci pocketing   Solid   GO   Hobert Poplaski MA,  CCC-SLP [phone]  Solid: Impaired Presentation: Self Fed Oral Phase Functional  Implications: Right lateral sulci pocketing (mild buccal residue on right)       Jody [name] 05/28/2014,11:27 AM

## 2014-05-28 NOTE — Progress Notes (Signed)
UR complete.  Steel Kerney RN, MSN 

## 2014-05-28 NOTE — Evaluation (Signed)
Speech Language Pathology Evaluation Patient Details [name] MRN: [id number] DOB: November 26, 1940 Today's Date: 05/28/2014 Time: [id number] SLP Time Calculation (min) (ACUTE ONLY): 24 min  Problem List:  Patient Active Problem List   Diagnosis Date Noted  . CVA (cerebral infarction) 05/27/2014   Past Medical History:  Past Medical History  Diagnosis Date  . Arthritis   . Diabetes mellitus without complication   . Hypertension    Past Surgical History:  Past Surgical History  Procedure Laterality Date  . Abdominal surgery    . Joint replacement     HPI:  74 y.o. female presenting with new onset dysarthria, right hemiparesis, right face weakness . Per MD, symptoms consistent with CVA vs TIA with negative CT and MRI/MRA. Known stroke risk factors include DM, HTN despite negative w/u.    Assessment / Plan / Recommendation Clinical Impression  Cognitive-linguistic evaluation complete. Although symptoms appear to be resolving base on patient and sister report, patient continues to present with mild aphasia, mild-moderate dysarthria, and mild high level reasoning/problem solving deficits which have the ability to impact safety with independent living. SLP will f/u for skilled treatment. Recommend 24 hour supervision and either HH or OP SLP treatment following d/c. Extensive education complete regarding above with patient and sister who agree that assistance will be needed in the short term after d/c but have concerns regarding how to acheive this. SLP will f/u. Recommend also social work consult following PT/OT evaluation to assist with needs after d/c.     SLP Assessment  Patient needs continued Speech Lanaguage Pathology Services    Follow Up Recommendations  Home health SLP;Outpatient SLP    Frequency and Duration min 2x/week  2 weeks   Pertinent Vitals/Pain Pain Assessment: No/denies pain   SLP Goals  Potential to Achieve Goals (ACUTE ONLY): Good  SLP  Evaluation Prior Functioning  Cognitive/Linguistic Baseline: Within functional limits Type of Home: House  Lives With: Alone   Cognition  Overall Cognitive Status: Impaired/Different from baseline Arousal/Alertness: Awake/alert Orientation Level: Oriented to person;Oriented to place;Oriented to situation;Oriented to time Attention: Sustained Sustained Attention: Appears intact Memory: Appears intact Awareness: Appears intact Problem Solving: Impaired Problem Solving Impairment: Verbal complex;Functional complex Safety/Judgment: Appears intact    Comprehension  Auditory Comprehension Overall Auditory Comprehension: Appears within functional limits for tasks assessed Visual Recognition/Discrimination Discrimination: Not tested Reading Comprehension Reading Status: Not tested    Expression Expression Primary Mode of Expression: Verbal Verbal Expression Overall Verbal Expression: Impaired Initiation: No impairment Automatic Speech: Name;Social Response Level of Generative/Spontaneous Verbalization: Conversation Repetition: No impairment Naming: Impairment Responsive: 76-100% accurate Confrontation: Impaired Convergent: 75-100% accurate Divergent: 75-100% accurate Pragmatics: No impairment   Oral / Motor Oral Motor/Sensory Function Overall Oral Motor/Sensory Function: Impaired Labial ROM: Within Functional Limits Labial Symmetry: Within Functional Limits Labial Strength: Reduced (right) Labial Sensation: Within Functional Limits Lingual ROM: Reduced right Lingual Symmetry: Within Functional Limits Lingual Strength: Reduced Lingual Sensation: Within Functional Limits Facial ROM: Within Functional Limits Facial Symmetry: Within Functional Limits Facial Strength: Within Functional Limits Facial Sensation: Within Functional Limits Velum: Within Functional Limits Mandible: Within Functional Limits Motor Speech Overall Motor Speech: Impaired Respiration: Within  functional limits Phonation: Normal Resonance: Within functional limits Articulation: Impaired Level of Impairment: Word Intelligibility: Intelligibility reduced Word: 75-100% accurate Phrase: 75-100% accurate Sentence: 75-100% accurate Conversation: 75-100% accurate Motor Planning: Witnin functional limits Interfering Components: Inadequate dentition   GO    Jody Rainwater MA, CCC-SLP [phone]  Jody [name] Jody [name] 05/28/2014, 11:34 AM

## 2014-05-28 NOTE — Progress Notes (Signed)
STROKE TEAM PROGRESS NOTE   HISTORY Jody [name] is an 74 y.o. female with a past medical history significant for HTN, DM, brought in by EMS as a code stroke due to acute onset of dysarthria, right hemiparesis, right face weakness. Patient reportedly had a normal conversation with her daughter at 5 pm today 05/27/2014 (LKW time unclear), but when she got home 30 minutes later patient was having slurring speech and EMS was summoned. EMS indicated that upon initial assessment patient had profound weakness of the right side, right face, and slurred speech. Further, had SBP>230. Initial NIHSS 7. CT brain revealed no acute abnormality. She denied HA, vertigo, double vision, numbness-tingling, or visual disturbances. NIHSS: 7  Patient was not administered TPA secondary to unclear last known normal. She was admitted for further evaluation and treatment.   SUBJECTIVE (INTERVAL HISTORY) Her sister is at the bedside.  States she was in "bad shape".  Overall she feels her condition is rapidly improving. She reports she is talking more today, speech less slurred. She still has some right sided weakness. She denies hx of stroke.   OBJECTIVE Temp:  [98.2 F (36.8 C)-99.3 F (37.4 C)] 98.6 F (37 C) (04/14 0945) Pulse Rate:  [56-69] 64 (04/14 0945) Cardiac Rhythm:  [-] Normal sinus rhythm;Sinus bradycardia (04/13 2222) Resp:  [18-21] 20 (04/14 0945) BP: (146-211)/(66-156) 211/87 mmHg (04/14 0945) SpO2:  [97 %-100 %] 99 % (04/14 0945) Weight:  [89.858 kg (198 lb 1.6 oz)] 89.858 kg (198 lb 1.6 oz) (04/13 1901)   Recent Labs Lab 05/27/14 1837 05/27/14 2318 05/28/14 0636  GLUCAP 113* 105* 119*    Recent Labs Lab 05/27/14 1827 05/27/14 1835 05/28/14 0745  NA 139 141 144  K 3.9 3.9 3.9  CL 110 110 113*  CO2 20  --  19  GLUCOSE 115* 115* 112*  BUN 29* 29* 22  CREATININE 1.82* 1.80* 1.56*  CALCIUM 8.6  --  8.8    Recent Labs Lab 05/27/14 1827  AST 16  ALT 13  ALKPHOS 69  BILITOT 0.7   PROT 6.3  ALBUMIN 3.1*    Recent Labs Lab 05/27/14 1827 05/27/14 1835  WBC 5.3  --   NEUTROABS 3.3  --   HGB 8.6* 9.5*  HCT 26.7* 28.0*  MCV 93.4  --   PLT 192  --    No results for input(s): CKTOTAL, CKMB, CKMBINDEX, TROPONINI in the last 168 hours.  Recent Labs  05/27/14 1827  LABPROT 14.0  INR 1.07    Recent Labs  05/27/14 1902  COLORURINE YELLOW  LABSPEC 1.012  PHURINE 5.0  GLUCOSEU NEGATIVE  HGBUR NEGATIVE  BILIRUBINUR NEGATIVE  KETONESUR NEGATIVE  PROTEINUR 100*  UROBILINOGEN 0.2  NITRITE NEGATIVE  LEUKOCYTESUR NEGATIVE       Component Value Date/Time   CHOL 153 05/28/2014 0745   TRIG 121 05/28/2014 0745   HDL 45 05/28/2014 0745   CHOLHDL 3.4 05/28/2014 0745   VLDL 24 05/28/2014 0745   LDLCALC 84 05/28/2014 0745   No results found for: HGBA1C    Component Value Date/Time   LABOPIA NONE DETECTED 05/27/2014 1903   COCAINSCRNUR NONE DETECTED 05/27/2014 1903   LABBENZ NONE DETECTED 05/27/2014 1903   AMPHETMU NONE DETECTED 05/27/2014 1903   THCU NONE DETECTED 05/27/2014 1903   LABBARB NONE DETECTED 05/27/2014 1903     Recent Labs Lab 05/27/14 1827  ETH <5   I have personally reviewed the radiological images below and agree with the radiology interpretations.  Ct Head  Wo Contrast 05/27/2014    Moderate chronic small vessel ischemic change. No CT findings of acute infarct. No hemorrhage.    MRI HEAD  05/27/2014   no evidence for acute or subacute infarction. remote lacunar infarcts of the basal ganglia bilaterally. No acute hemorrhage or mass.   MRA HEAD  05/27/2014   Atherosclerotic irregularity is present within the internal carotid arteries bilaterally. There is mild moderate narrowing of the mid left A1 segment. The anterior communicating artery is patent. The M1 segments are within normal limits bilaterally. The MCA bifurcations are normal. There is mild attenuation of distal ACA and MCA branch vessels bilaterally. Tortuosity is noted in the  cervical internal carotid arteries.  The vertebral arteries are codominant. The left PICA origin is visualized and normal. The right AICA is dominant. The basilar artery is tortuous without focal stenosis. Both posterior cerebral arteries originate from the basilar tip. There is some attenuation of distal PCA branch vessels bilaterally.     CUS - cancelled by primary team  2D echo - pending   PHYSICAL EXAM  Temp:  [98 F (36.7 C)-99.3 F (37.4 C)] 98.2 F (36.8 C) (04/14 1627) Pulse Rate:  [56-116] 116 (04/14 1627) Resp:  [18-21] 20 (04/14 1627) BP: (146-211)/(66-156) 193/76 mmHg (04/14 1627) SpO2:  [92 %-100 %] 92 % (04/14 1627) Weight:  [198 lb 1.6 oz (89.858 kg)] 198 lb 1.6 oz (89.858 kg) (04/13 1901)  General - Well nourished, well developed, in no apparent distress.  Ophthalmologic - fundi not visualized due to incorporation.  Cardiovascular - Regular rate and rhythm.  Mental Status -  Awake, alert, orientated to place and people, not to time or situation. Language including expression, naming, repetition, comprehension was assessed and found intact, mild dysarthria likely baseline due to poor denture.  Cranial Nerves II - XII - II - Visual field intact OU. III, IV, VI - Extraocular movements intact. V - Facial sensation intact bilaterally. VII - Facial movement intact bilaterally. VIII - Hearing & vestibular intact bilaterally. X - Palate elevates symmetrically, mild dysarthria. XI - Chin turning & shoulder shrug intact bilaterally. XII - Tongue protrusion intact.  Motor Strength - The patient's strength was 4/5 in all extremities and pronator drift was absent.  Bulk was normal and fasciculations were absent.   Motor Tone - Muscle tone was assessed at the neck and appendages and was normal.  Reflexes - The patient's reflexes were 1+ in all extremities and she had no pathological reflexes.  Sensory - Light touch, temperature/pinprick were assessed and were  symmetrical.    Coordination - The patient had normal movements in the hands with no ataxia or dysmetria.  Tremor was absent.  Gait and Station - deferred due to safety concerns.   ASSESSMENT/PLAN [name] is a 74 y.o. female with history of HTN, DM found with dysarthria, right hemiparesis, right face weakness. She did not receive IV t-PA due to unclear LKW.   L brain TIA  Resultant  Dysarthria, likely due to poor denture (had bilateral UE as well as LE numbness, not new)  MRI  No acute stroke  MRA  Diffuse atherosclerotic disease  Carotid Doppler  canceled by primary team ??   2D Echo  pending   LDL 84  HgbA1c pending  Heparin 5000 units sq tid for VTE prophylaxis Diet NPO time specified  aspirin 325 mg orally every day prior to admission, now on aspirin 81 mg orally every day though pt NPO. Failed stroke swallow screen.  SLP to assess swallow. Recommend aspirin suppository until passes. Once she does, recommend change to plavix 75 mg daily  Ongoing aggressive stroke risk factor management  Recommend OP 30 d telemetry monitoring for evaluation of atrial fibrillation as source of stroke   Therapy recommendations:  pending   Disposition:  pending   Hypertension  Home meds:   Coreg, hydralazine  Unstable, elevated  BP 146-211/72-120 past 24h (05/28/2014 @ 10:41 AM)  Hyperlipidemia  Home meds:  zocor 40 mg daily, not yet resumed in hospital  LDL 84, goal < 70  Continue statin at discharge  Diabetes  HgbA1c pending, goal < 7.0  Other Stroke Risk Factors  Advanced age  Former Cigarette smoker, quit smoking   Obesity, Body mass index is 31.99 kg/(m^2).   Other Active Problems  Acute on chronic kidney disease stage III  Asthma  Uncertain CHF hx  Hospital day # [place] for Pager information 05/28/2014 10:45 AM   I, the attending vascular neurologist, have personally obtained a history,  examined the patient, evaluated laboratory data, individually viewed imaging studies and agree with radiology interpretations. I also obtained additional history from pt's sister at bedside. Together with the NP/PA, we formulated the assessment and plan of care which reflects our mutual decision.  I have made any additions or clarifications directly to the above note and agree with the findings and plan as currently documented.   74 year old female with history of hypertension, diabetes presented with transient right-sided weakness and decreased language output. MRI negative for acute stroke, examination showed no focal deficit. Concerning for TIA with potential embolic phenomena. Recommend to switch to Plavix once passed swallow, and 30 day cardiac event monitoring as outpatient to rule out A. Fib. 2-D Echo pending, carotid Doppler was canceled by primary team.  Rosalin Hawking, MD PhD Stroke Neurology 05/28/2014 6:32 PM       To contact Stroke Continuity provider, please refer to [email or web address] After hours, contact General Neurology

## 2014-05-28 NOTE — Progress Notes (Signed)
PT Cancellation Note  Patient Details [name] MRN: [id number] DOB: 02-Nov-1940   Cancelled Treatment:    Reason Eval/Treat Not Completed: Patient not medically ready BP has been steadily rising since this afternoon. PT took patient's BP x2 prior to evaluation with SBP of 219/96 and 214/88 respectively. Will hold formal evaluation until BP is in a safe range for increased activity.  Ellouise Newer 05/28/2014, 3:44 PM Camille Bal [place], [place]

## 2014-05-29 DIAGNOSIS — I6789 Other cerebrovascular disease: Secondary | ICD-10-CM

## 2014-05-29 DIAGNOSIS — I63012 Cerebral infarction due to thrombosis of left vertebral artery: Secondary | ICD-10-CM

## 2014-05-29 DIAGNOSIS — G458 Other transient cerebral ischemic attacks and related syndromes: Secondary | ICD-10-CM | POA: Insufficient documentation

## 2014-05-29 LAB — GLUCOSE, CAPILLARY
GLUCOSE-CAPILLARY: 126 mg/dL — AB (ref 70–99)
Glucose-Capillary: 110 mg/dL — ABNORMAL HIGH (ref 70–99)
Glucose-Capillary: 185 mg/dL — ABNORMAL HIGH (ref 70–99)
Glucose-Capillary: 98 mg/dL (ref 70–99)
Glucose-Capillary: 122 mg/dL — ABNORMAL HIGH (ref 70–99)

## 2014-05-29 LAB — BASIC METABOLIC PANEL
Anion gap: 8 (ref 5–15)
BUN: 23 mg/dL (ref 6–23)
CHLORIDE: 111 mmol/L (ref 96–112)
CO2: 24 mmol/L (ref 19–32)
Calcium: 8.7 mg/dL (ref 8.4–10.5)
Creatinine, Ser: 1.9 mg/dL — ABNORMAL HIGH (ref 0.50–1.10)
GFR, EST AFRICAN AMERICAN: 28 mL/min — AB (ref >90)
GFR, EST NON AFRICAN AMERICAN: 25 mL/min — AB (ref >90)
Glucose, Bld: 132 mg/dL — ABNORMAL HIGH (ref 70–99)
POTASSIUM: 4.1 mmol/L (ref 3.5–5.1)
Sodium: 143 mmol/L (ref 135–145)

## 2014-05-29 LAB — HEMOGLOBIN A1C
HEMOGLOBIN A1C: 5.7 % — AB (ref 4.8–5.6)
Hgb A1c MFr Bld: 5.1 % (ref 4.8–5.6)
MEAN PLASMA GLUCOSE: 117 mg/dL
Mean Plasma Glucose: 100 mg/dL

## 2014-05-29 MED ORDER — LISINOPRIL 2.5 MG PO TABS
2.5000 mg | ORAL_TABLET | Freq: Every day | ORAL | Status: DC
  Administered 2014-05-29 – 2014-05-30 (×2): 2.5 mg via ORAL
  Filled 2014-05-29 (×2): qty 1

## 2014-05-29 MED ORDER — CLOPIDOGREL BISULFATE 75 MG PO TABS
75.0000 mg | ORAL_TABLET | Freq: Every day | ORAL | Status: DC
  Administered 2014-05-29 – 2014-05-30 (×2): 75 mg via ORAL
  Filled 2014-05-29 (×2): qty 1

## 2014-05-29 MED ORDER — HYDRALAZINE HCL 50 MG PO TABS
50.0000 mg | ORAL_TABLET | Freq: Two times a day (BID) | ORAL | Status: DC
  Administered 2014-05-29: 50 mg via ORAL
  Filled 2014-05-29: qty 1

## 2014-05-29 MED ORDER — INSULIN ASPART 100 UNIT/ML ~~LOC~~ SOLN
0.0000 [IU] | Freq: Three times a day (TID) | SUBCUTANEOUS | Status: DC
  Administered 2014-05-30 (×2): 1 [IU] via SUBCUTANEOUS

## 2014-05-29 MED ORDER — HYDRALAZINE HCL 25 MG PO TABS
25.0000 mg | ORAL_TABLET | Freq: Three times a day (TID) | ORAL | Status: DC
  Administered 2014-05-29 – 2014-05-30 (×2): 25 mg via ORAL
  Filled 2014-05-29 (×2): qty 1

## 2014-05-29 NOTE — Progress Notes (Signed)
Echocardiogram 2D Echocardiogram has been performed.  Jody [name] 05/29/2014, 10:30 AM

## 2014-05-29 NOTE — Progress Notes (Signed)
Family Medicine Teaching Service Daily Progress Note Intern Pager: [phone]  Patient [name] Medical record number: [id number] Date of birth: 11-17-1940 Age: 74 y.o. Gender: female  Primary Care Provider: Neale Burly, MD Consultants: neurology Code Status: Full  Pt Overview and Major Events to Date:  4/14: admission for concern for stroke  Assessment and Plan:  Jody [name] is a 74 y.o. female presenting with new onset dysarthria, right hemiparesis, right face weakness . PMH is significant for HTN, DM, Arthritis, CHF?  Right sided hemiparesis/dysarthria: Symptoms consistent with stroke vs TIA with negative CT head and MRI/MRA brain. Imaging done without contrast 2/2 to elevated creatinine. She does have known risk factors for stroke with DM, and HTN despite negative work up this far. Unsure of the cause at the point, most likely ischemic. Per the family, her motor function and speech have improved greatly as compared to when they first found her but she is not at her baseline.  - continue telemetry - Neuro following ,appreciate recs - F/u HgbA1c - fasting Lipid panel sig for LDL 84, goal <70 continue lipitor -f/u echocardiogram -passed swallow study, started on plavix 75 qD - S/p PT/OT/SLP - Neuro checks q4 - Fall precautions - Consider outpatient holter monitor to further rule out arrythmia -plavix started  AoCKD III: Cr elevated to 1.82 with GFR 26 today from Cr 1.58 5 months ago. Pt unaware of kidney disease history. UA with no signs of infection but showing some protein. Most likely 2/2 to her DM.  - Continue to monitor and trend BMPs  Uncertain history of CHF- Pt and POA uncertain if she has CHF. Pt endorses baseline orthopnea with occasional swelling of BLE making history of CHF likely. But given normal lung exam with no LE edema noted unlikely to be having an acute exacerbation.. She currently takes lasix 40 mg and coreg 3.125 BID - ECHO pending   HTN:  Will allow for permissive HTN in the first 24-48 hours  - restarted home antihypertensive, hydralazine 25 BID, also takes lasix 40 and coreg 3.125, Will increase hydralazine to 50 BID and consider further alteration of blood pressure regimen   - PRN hydralazine to treat per Stroke protocol  HLD - Continue home simvastatin after swallow study in AM 4/14 - f/u Am lipid panel  Asthma: stable, no wheezing on exam.  - Continue home symbicort  DM: Unknown if controlled or not.  - Follow A1c - SSI  - hold home metformin  FEN/GI:  -Dysphagia 3 diet - KVO as she has questionable history of CHF with unknown cardiac function  Prophylaxis: subcutaneous heparin   Disposition: To SNF with rehab, SW consulted  Subjective:  Feeling improved weakness and speech.   Objective: Temp:  [98 F (36.7 C)-98.6 F (37 C)] 98.4 F (36.9 C) (04/15 0534) Pulse Rate:  [57-116] 59 (04/15 0534) Resp:  [18-20] 18 (04/15 0534) BP: (171-211)/(61-106) 174/61 mmHg (04/15 0534) SpO2:  [92 %-100 %] 99 % (04/15 0534) Physical Exam: General: NAD, lying comfortably in bed HEENT: normal cephalic, non traumatic, resolved right facial droop Cardiovascular: RRR no murmurs Respiratory: CTAB, no wheezes Abdomen: soft non-tender, non distended, no organomegally Extremities: No edema, Skin: warm well perfused, no rashes or lesions Neuro: Alert and oriented x4, improved CN II-XII without deficits, no focal deficits in sensation or motor function, . Improving dysarthria Negative babinski b/l  Neuro: AO x2, disoriented to year  Laboratory:  Recent Labs Lab 05/27/14 1827 05/27/14 1835  WBC 5.3  --  HGB 8.6* 9.5*  HCT 26.7* 28.0*  PLT 192  --     Recent Labs Lab 05/27/14 1827 05/27/14 1835 05/28/14 0745  NA 139 141 144  K 3.9 3.9 3.9  CL 110 110 113*  CO2 20  --  19  BUN 29* 29* 22  CREATININE 1.82* 1.80* 1.56*  CALCIUM 8.6  --  8.8  PROT 6.3  --   --   BILITOT 0.7  --   --   ALKPHOS 69  --   --    ALT 13  --   --   AST 16  --   --   GLUCOSE 115* 115* 112*      Imaging/Diagnostic Tests: 4/13  MRI HEAD FINDINGS  The diffusion-weighted images demonstrate no evidence for acute or subacute infarction. Confluent periventricular white matter changes are evident bilaterally. There are remote lacunar infarcts of the basal ganglia bilaterally. No significant cortical infarct is present. The ventricles are of normal size.  No acute hemorrhage or mass lesion is present. Flow is present in the major intracranial arteries. The globes and orbits are intact the paranasal sinuses and mastoid air cells are clear. No significant extraaxial fluid collection is present. Midline structures are within normal limits the brain. Advanced degenerative changes are present at the C4-5 level.  MRA HEAD FINDINGS  Atherosclerotic irregularity is present within the internal carotid arteries bilaterally. There is mild moderate narrowing of the mid left A1 segment. The anterior communicating artery is patent. The M1 segments are within normal limits bilaterally. The MCA bifurcations are normal. There is mild attenuation of distal ACA and MCA branch vessels bilaterally. Tortuosity is noted in the cervical internal carotid arteries.  The vertebral arteries are codominant. The left PICA origin is visualized and normal. The right AICA is dominant. The basilar artery is tortuous without focal stenosis. Both posterior cerebral arteries originate from the basilar tip. There is some attenuation of distal PCA branch vessels bilaterally.  CT head w/o contrast  IMPRESSION: Moderate chronic small vessel ischemic change. No CT findings of acute infarct. No hemorrhage.  These results were called by telephone at the time of interpretation on 05/27/2014 at 6:42 pm to Dr. Armida Sans , who verbally acknowledged these results.   Jody Bourbon, MD 05/29/2014, 8:58 AM PGY-1 [facility] Teaching Service Daily Progress Note Intern Pager: [phone]  Patient [name] Medical record number: [id number] Date of birth: [birth year] Age: 74 y.o. Gender: female  Primary Care Provider: Neale Burly, MD Consultants: neurology Code Status: Full  Pt Overview and Major Events to Date:  4/14: admission for concern for stroke  Assessment and Plan:  Right sided hemiparesis/dysarthria: Symptoms consistent with stroke vs TIA with negative CT head and MRI/MRA brain. Imaging done without contrast 2/2 to elevated creatinine. She does have known risk factors for stroke with DM, and HTN despite negative work up this far. Unsure of the cause at the point, most likely ischemic. Per the family, her motor function and speech have improved greatly as compared to when they first found her but she is not at her baseline.  - continue tele,erty - Assessed by Neurology in the ED, will continue to follow and appreciate recs - F/u HgbA1c, fasting Lipid panel wnl -f/u echocardiogram -PPX aspirin by suppository as she failed nurse swallow study -PT/OT/SLP - Neuro checks q2 hrs for 12 hrs then q4 - Fall precautions  AoCKD III: Cr elevated to 1.82 with GFR 26 today from  Cr 1.58 5 months ago. Pt unaware of kidney disease history. UA with no signs of infection but showing some protein. Most likely 2/2 to her DM.  - Continue to monitor and trend BMPs  Uncertain history of CHF-  -Echo was normal with normal EF - Will still continue coreg and lasix, but largely for BP as she does not have CHF  HTN:  - Elevated BPS yesterday and overnight, will increase home hydralazine to 25 TID from BID and continue to monitor closely   HLD - Continue home simvastatin after swallow study in AM 4/14 - f/u Am lipid panel  Asthma: stable, no wheezing on exam.  - Continue home symbicort  DM:  -A1C 5.1, normal  Questionable DM -As she has had episodes of hyperglycemia will continue  SSI  FEN/GI:  - -Passed SLP swallow study,   Prophylaxis: subcutaneous heparin   Disposition: Pending clinical improvement  Subjective:  Feeling improved weakness and speech. Wants to eat  Objective: Temp:  [98 F (36.7 C)-98.6 F (37 C)] 98.4 F (36.9 C) (04/15 0534) Pulse Rate:  [57-116] 59 (04/15 0534) Resp:  [18-20] 18 (04/15 0534) BP: (171-211)/(61-106) 174/61 mmHg (04/15 0534) SpO2:  [92 %-100 %] 99 % (04/15 0534) Physical Exam: General: NAD, lying comfortably in bed HEENT: normal cephalic, non traumatic, resolved right facial droop Cardiovascular: RRR no murmurs Respiratory: CTAB, no wheezes Abdomen: soft non-tender, non distended, no organomegally Extremities: No edema, Skin: warm well perfused, no rashes or lesions Neuro: Alert and oriented x 2, unable to identify the year   Laboratory:  Recent Labs Lab 05/27/14 1827 05/27/14 1835  WBC 5.3  --   HGB 8.6* 9.5*  HCT 26.7* 28.0*  PLT 192  --     Recent Labs Lab 05/27/14 1827 05/27/14 1835 05/28/14 0745  NA 139 141 144  K 3.9 3.9 3.9  CL 110 110 113*  CO2 20  --  19  BUN 29* 29* 22  CREATININE 1.82* 1.80* 1.56*  CALCIUM 8.6  --  8.8  PROT 6.3  --   --   BILITOT 0.7  --   --   ALKPHOS 69  --   --   ALT 13  --   --   AST 16  --   --   GLUCOSE 115* 115* 112*      Imaging/Diagnostic Tests: 4/13  MRI HEAD FINDINGS  The diffusion-weighted images demonstrate no evidence for acute or subacute infarction. Confluent periventricular white matter changes are evident bilaterally. There are remote lacunar infarcts of the basal ganglia bilaterally. No significant cortical infarct is present. The ventricles are of normal size.  No acute hemorrhage or mass lesion is present. Flow is present in the major intracranial arteries. The globes and orbits are intact the paranasal sinuses and mastoid air cells are clear. No significant extraaxial fluid collection is present. Midline structures are  within normal limits the brain. Advanced degenerative changes are present at the C4-5 level.  MRA HEAD FINDINGS  Atherosclerotic irregularity is present within the internal carotid arteries bilaterally. There is mild moderate narrowing of the mid left A1 segment. The anterior communicating artery is patent. The M1 segments are within normal limits bilaterally. The MCA bifurcations are normal. There is mild attenuation of distal ACA and MCA branch vessels bilaterally. Tortuosity is noted in the cervical internal carotid arteries.  The vertebral arteries are codominant. The left PICA origin is visualized and normal. The right AICA is dominant. The basilar artery is tortuous without  focal stenosis. Both posterior cerebral arteries originate from the basilar tip. There is some attenuation of distal PCA branch vessels bilaterally.  CT head w/o contrast  IMPRESSION: Moderate chronic small vessel ischemic change. No CT findings of acute infarct. No hemorrhage.  These results were called by telephone at the time of interpretation on 05/27/2014 at 6:42 pm to Dr. Armida Sans , who verbally acknowledged these results.   Jody Bourbon, MD 05/29/2014, 8:59 AM PGY-1 [facility] Intern pager: [phone], text pages welcome

## 2014-05-29 NOTE — Progress Notes (Signed)
STROKE TEAM PROGRESS NOTE   HISTORY Shaqualla Hennig is an 74 y.o. female with a past medical history significant for HTN, DM, brought in by EMS as a code stroke due to acute onset of dysarthria, right hemiparesis, right face weakness. Patient reportedly had a normal conversation with her daughter at 5 pm today 05/27/2014 (LKW time unclear), but when she got home 30 minutes later patient was having slurring speech and EMS was summoned. EMS indicated that upon initial assessment patient had profound weakness of the right side, right face, and slurred speech. Further, had SBP>230. Initial NIHSS 7. CT brain revealed no acute abnormality. She denied HA, vertigo, double vision, numbness-tingling, or visual disturbances. NIHSS: 7  Patient was not administered TPA secondary to unclear last known normal. She was admitted for further evaluation and treatment.   SUBJECTIVE (INTERVAL HISTORY) Sister is at bedside. The patient feels her deficits are resolved. Carotid Doppler was canceled due to unknown reasons. TTE done.   OBJECTIVE Temp:  [98 F (36.7 C)-98.4 F (36.9 C)] 98.2 F (36.8 C) (04/15 1424) Pulse Rate:  [57-116] 57 (04/15 1424) Cardiac Rhythm:  [-] Normal sinus rhythm;Sinus bradycardia (04/15 0800) Resp:  [18-20] 20 (04/15 1424) BP: (144-193)/(61-76) 144/72 mmHg (04/15 1424) SpO2:  [92 %-100 %] 100 % (04/15 1424)   Recent Labs Lab 05/28/14 1110 05/28/14 1627 05/28/14 2150 05/29/14 0652 05/29/14 1246  GLUCAP 142* 110* 134* 126* 185*    Recent Labs Lab 05/27/14 1827 05/27/14 1835 05/28/14 0745 05/29/14 1009  NA 139 141 144 143  K 3.9 3.9 3.9 4.1  CL 110 110 113* 111  CO2 20  --  19 24  GLUCOSE 115* 115* 112* 132*  BUN 29* 29* 22 23  CREATININE 1.82* 1.80* 1.56* 1.90*  CALCIUM 8.6  --  8.8 8.7    Recent Labs Lab 05/27/14 1827  AST 16  ALT 13  ALKPHOS 69  BILITOT 0.7  PROT 6.3  ALBUMIN 3.1*    Recent Labs Lab 05/27/14 1827 05/27/14 1835  WBC 5.3  --    NEUTROABS 3.3  --   HGB 8.6* 9.5*  HCT 26.7* 28.0*  MCV 93.4  --   PLT 192  --    No results for input(s): CKTOTAL, CKMB, CKMBINDEX, TROPONINI in the last 168 hours.  Recent Labs  05/27/14 1827  LABPROT 14.0  INR 1.07    Recent Labs  05/27/14 1902  COLORURINE YELLOW  LABSPEC 1.012  PHURINE 5.0  GLUCOSEU NEGATIVE  HGBUR NEGATIVE  BILIRUBINUR NEGATIVE  KETONESUR NEGATIVE  PROTEINUR 100*  UROBILINOGEN 0.2  NITRITE NEGATIVE  LEUKOCYTESUR NEGATIVE       Component Value Date/Time   CHOL 153 05/28/2014 0745   TRIG 121 05/28/2014 0745   HDL 45 05/28/2014 0745   CHOLHDL 3.4 05/28/2014 0745   VLDL 24 05/28/2014 0745   LDLCALC 84 05/28/2014 0745   Lab Results  Component Value Date   HGBA1C 5.1 05/28/2014      Component Value Date/Time   LABOPIA NONE DETECTED 05/27/2014 1903   COCAINSCRNUR NONE DETECTED 05/27/2014 1903   LABBENZ NONE DETECTED 05/27/2014 1903   AMPHETMU NONE DETECTED 05/27/2014 1903   THCU NONE DETECTED 05/27/2014 1903   LABBARB NONE DETECTED 05/27/2014 1903     Recent Labs Lab 05/27/14 1827  ETH <5   I have personally reviewed the radiological images below and agree with the radiology interpretations.  Ct Head Wo Contrast 05/27/2014    Moderate chronic small vessel ischemic change. No CT findings  of acute infarct. No hemorrhage.    MRI HEAD  05/27/2014    No evidence for acute or subacute infarction. remote lacunar infarcts of the basal ganglia bilaterally. No acute hemorrhage or mass.   MRA HEAD  05/27/2014    Atherosclerotic irregularity is present within the internal carotid arteries bilaterally. There is mild moderate narrowing of the mid left A1 segment. The anterior communicating artery is patent. The M1 segments are within normal limits bilaterally. The MCA bifurcations are normal. There is mild attenuation of distal ACA and MCA branch vessels bilaterally. Tortuosity is noted in the cervical internal carotid arteries.  The vertebral  arteries are codominant. The left PICA origin is visualized and normal. The right AICA is dominant. The basilar artery is tortuous without focal stenosis. Both posterior cerebral arteries originate from the basilar tip. There is some attenuation of distal PCA branch vessels bilaterally.     CUS - reordered and pending  2D echo 05/29/2014 Study Conclusions - Left ventricle: The cavity size was normal. Systolic function was normal. The estimated ejection fraction was in the range of 55% to 60%. Wall motion was normal; there were no regional wall motion abnormalities. Left ventricular diastolic function parameters were normal. - Aortic valve: There was trivial regurgitation. - Mitral valve: There was mild to moderate regurgitation directed centrally. - Left atrium: The atrium was mildly dilated.   PHYSICAL EXAM  Temp:  [98 F (36.7 C)-98.4 F (36.9 C)] 98.2 F (36.8 C) (04/15 1424) Pulse Rate:  [57-116] 57 (04/15 1424) Resp:  [18-20] 20 (04/15 1424) BP: (144-193)/(61-76) 144/72 mmHg (04/15 1424) SpO2:  [92 %-100 %] 100 % (04/15 1424)  General - Well nourished, well developed, in no apparent distress.  Ophthalmologic - fundi not visualized due to incorporation.  Cardiovascular - Regular rate and rhythm.  Mental Status -  Awake, alert, orientated to place and people, not to time or situation. Language including expression, naming, repetition, comprehension was assessed and found intact, mild dysarthria likely baseline due to poor denture.  Cranial Nerves II - XII - II - Visual field intact OU. III, IV, VI - Extraocular movements intact. V - Facial sensation intact bilaterally. VII - Facial movement intact bilaterally. VIII - Hearing & vestibular intact bilaterally. X - Palate elevates symmetrically, mild dysarthria likely baseline. XI - Chin turning & shoulder shrug intact bilaterally. XII - Tongue protrusion intact.  Motor Strength - The patient's strength was  4/5 in all extremities and pronator drift was absent.  Bulk was normal and fasciculations were absent.   Motor Tone - Muscle tone was assessed at the neck and appendages and was normal.  Reflexes - The patient's reflexes were 1+ in all extremities and she had no pathological reflexes.  Sensory - Light touch, temperature/pinprick were assessed and were symmetrical.    Coordination - The patient had normal movements in the hands with no ataxia or dysmetria.  Tremor was absent.  Gait and Station - deferred due to safety concerns.   ASSESSMENT/PLAN Ms. Adlemi Skipwith is a 74 y.o. female with history of HTN, DM found with dysarthria, right hemiparesis, right face weakness. She did not receive IV t-PA due to unclear LKW.   L brain TIA  Resultant  Dysarthria, likely due to poor denture (had bilateral UE as well as LE numbness, not new)  MRI  No acute stroke  MRA  Diffuse atherosclerotic disease  Carotid Doppler - reordered and pending  2D Echo - EF 55-60%. No cardiac source of emboli identified.  LDL 84  HgbA1c - 5.1  Heparin 5000 units sq tid for VTE prophylaxis DIET DYS 3 Room service appropriate?: Yes; Fluid consistency:: Thin  aspirin 325 mg orally every day prior to admission, now on Plavix 75 mg daily. Continue Plavix on discharge.  Ongoing aggressive stroke risk factor management  Recommend OP 30 d telemetry monitoring for evaluation of atrial fibrillation as source of stroke   Therapy recommendations:  Home health physical therapy, occupational therapy, and speech therapy.  Disposition:  pending   Hypertension  Home meds:   Coreg, hydralazine  Unstable, elevated  BP 146-211/72-120 past 24h (05/29/2014 @ 2:30 PM)  Hyperlipidemia  Home meds:  zocor 40 mg daily  LDL 84, goal < 70  On Lipitor 40 now  Continue statin at discharge  Diabetes  HgbA1c 5.1, goal < 7.0  Management as per primary team  Other Stroke Risk Factors  Advanced age  Former  Cigarette smoker, quit smoking   Obesity, Body mass index is 31.99 kg/(m^2).   Other Active Problems  Acute on chronic kidney disease stage III  Asthma  Uncertain CHF hx  Hospital day # 2  RINEHULS, [place] for Pager information 05/29/2014 2:30 PM   I, the attending vascular neurologist, have personally obtained a history, examined the patient, evaluated laboratory data, individually viewed imaging studies and agree with radiology interpretations. I also obtained additional history from pt's sister at bedside. Together with the NP/PA, we formulated the assessment and plan of care which reflects our mutual decision.  I have made any additions or clarifications directly to the above note and agree with the findings and plan as currently documented.   74 year old female with history of hypertension, diabetes presented with transient right-sided weakness and decreased language output. MRI negative for acute stroke, examination showed no focal deficit. Concerning for TIA with potential embolic phenomena. Recommend to Plavix for stroke prevention, and 30 day cardiac event monitoring as outpatient to rule out A. Fib. 2-D Echo unremarkable, carotid Doppler pending.   Neurology will sign off. Please call if carotid Doppler significantly abnormal or with other questions. Pt will follow up with Dr. Erlinda Hong at [facility] in about 2 months. Thanks for the consult.  Rosalin Hawking, MD PhD Stroke Neurology 05/29/2014 4:26 PM   To contact Stroke Continuity provider, please refer to [email or web address] After hours, contact General Neurology

## 2014-05-29 NOTE — Evaluation (Signed)
Physical Therapy Evaluation Patient Details [name] MRN: [id number] DOB: [birth year] Today's Date: 05/29/2014   History of Present Illness  74 y.o. female with a past medical history significant for HTN, DM, brought in by EMS as a code stroke due to acute onset of dysarthria, right hemiparesis, right face weakness. NIHSS 7 on admission. MRI (-).  Clinical Impression  Patient demonstrates deficits in functional mobility as indicated below. Will need continued skilled PT to address deficits and maximize function. Will see as indicated and progress as tolerated. recommend pt D/C home with 24/7 S initially     Follow Up Recommendations Home health PT;Supervision/Assistance - 24 hour    Equipment Recommendations  None recommended by PT    Recommendations for Other Services       Precautions / Restrictions Precautions Precautions: Fall Restrictions Weight Bearing Restrictions: No      Mobility  Bed Mobility Overal bed mobility: Modified Independent                Transfers Overall transfer level: Needs assistance Equipment used: Rolling walker (2 wheeled) Transfers: Sit to/from Stand Sit to Stand: Supervision         General transfer comment: No physical assist required, VCs for safety and hand placement  Ambulation/Gait Ambulation/Gait assistance: Supervision Ambulation Distance (Feet): 90 Feet Assistive device: Rolling walker (2 wheeled) Gait Pattern/deviations: Step-through pattern;Decreased stride length;Trunk flexed Gait velocity: decreased Gait velocity interpretation: Below normal speed for age/gender General Gait Details: modest instability noted with use of RW, patient reports history of paresthesias in BLEs  Stairs            Wheelchair Mobility    Modified Rankin (Stroke Patients Only) Modified Rankin (Stroke Patients Only) Pre-Morbid Rankin Score: Slight disability Modified Rankin: Moderate disability     Balance      Sitting balance-Leahy Scale: Good     Standing balance support: During functional activity Standing balance-Leahy Scale: Fair                               Pertinent Vitals/Pain Pain Assessment: No/denies pain    Home Living Family/patient expects to be discharged to:: Private residence Living Arrangements: Alone Available Help at Discharge: Available PRN/intermittently (family checks on her daily) Type of Home: House Home Access: Ramped entrance     Home Layout: One level Home Equipment: Environmental consultant - 2 wheels;Shower seat      Prior Function Level of Independence: Independent with assistive device(s)         Comments:  (used RW in house and cane outside of house since fall 3 week)     Hand Dominance   Dominant Hand: Right    Extremity/Trunk Assessment     RUE Deficits / Details: weak RUE. isolated joint movement presetn, however uncoordinated with decreased fine motor skills; attempts to use during funcitonal tasks. unable to lift against gravity with shoulder flexion. Able to compelte full ROM with grvity eleiminated. Improvedment noted with repetitions   RUE Sensation:  (appears intact)     Lower Extremity Assessment: Generalized weakness (history of peripheral neuropathy)      Cervical / Trunk Assessment: Normal  Communication   Communication: Expressive difficulties  Cognition Arousal/Alertness: Awake/alert Behavior During Therapy: WFL for tasks assessed/performed Overall Cognitive Status: Impaired/Different from baseline                      General Comments  Exercises        Assessment/Plan    PT Assessment Patient needs continued PT services  PT Diagnosis Difficulty walking;Abnormality of gait;Generalized weakness   PT Problem List Decreased strength;Decreased activity tolerance;Decreased balance;Decreased mobility;Decreased coordination;Decreased cognition  PT Treatment Interventions DME instruction;Gait  training;Functional mobility training;Therapeutic activities;Therapeutic exercise;Balance training;Patient/family education   PT Goals (Current goals can be found in the Care Plan section) Acute Rehab PT Goals Patient Stated Goal: to go home PT Goal Formulation: With patient Time For Goal Achievement: 06/12/14 Potential to Achieve Goals: Good    Frequency Min 3X/week   Barriers to discharge        Co-evaluation               End of Session Equipment Utilized During Treatment: Gait belt Activity Tolerance: Patient tolerated treatment well Patient left: in chair;with call bell/phone within reach;with bed alarm set;with family/visitor present Nurse Communication: Mobility status         Time: [id number] PT Time Calculation (min) (ACUTE ONLY): 19 min   Charges:   PT Evaluation $Initial PT Evaluation Tier I: 1 Procedure     PT G CodesDuncan Dull 2014-06-28, 1:11 PM Alben Deeds, [place] DPT  [phone]

## 2014-05-29 NOTE — Progress Notes (Signed)
CARE MANAGEMENT NOTE 05/29/2014  Patient:  Jody [name], Jody [name]   Account Number:  [id number]  Date Initiated:  05/28/2014  Documentation initiated by:  Lorne Skeens  Subjective/Objective Assessment:   Patient was admitted with dysarthria, weakness.  Lives at home alone     Action/Plan:   Will follow for discharge needs pending PT/OT evals and physician orders.   Anticipated DC Date:     Anticipated DC Plan:  [place]         Choice offered to / List presented to:             Status of service:  In process, will continue to follow Medicare Important Message given?  YES (If response is "NO", the following Medicare IM given date fields will be blank) Date Medicare IM given:  05/29/2014 Medicare IM given by:  Lorne Skeens Date Additional Medicare IM given:   Additional Medicare IM given by:    Discharge Disposition:    Per UR Regulation:  Reviewed for med. necessity/level of care/duration of stay  If discussed at [place] of Stay Meetings, dates discussed:    Comments:  05/29/14 [place], MSN, CM- Medicare IM letter provided.

## 2014-05-29 NOTE — Progress Notes (Signed)
Speech Language Pathology Treatment: Cognitive-Linquistic  Patient Details [name] MRN: [id number] DOB: 09-Jan-1941 Today's Date: 05/29/2014 Time: 1720-1740 SLP Time Calculation (min) (ACUTE ONLY): 20 min  Assessment / Plan / Recommendation Clinical Impression  Pt with improved communication today - dysarthria has resolved; she continues to present with mild deficits in word-retrieval during propositional speech, with difficulty more apparent with more novel topics.  For likely D/C home tomorrow.  Pt with improved judgement; she is concerned about her own ability to take care of herself, and she and her family are asking about availability of some support to help her if needed.  Recommend HHSLP to accompany OT and PT.  Agree that 24/7 supervision may be beneficial initially, but intermittent supervision/support likely more appropriate after a period of time.   HPI HPI: 74 y.o. female presenting with new onset dysarthria, right hemiparesis, right face weakness . Per MD, symptoms consistent with CVA vs TIA with negative CT and MRI/MRA. Known stroke risk factors include DM, HTN despite negative w/u.    Pertinent Vitals    SLP Plan  Discharge SLP treatment due to (comment) (D/C from hospital)    Recommendations                Oral Care Recommendations: Oral care BID Follow up Recommendations: Home health SLP Plan: Discharge SLP treatment due to (comment) (D/C from hospital next date)   Jody [name] L. Tivis Ringer, [place] CCC/SLP Pager [phone]      [name] 05/29/2014, 5:53 PM

## 2014-05-29 NOTE — Clinical Social Work Note (Signed)
Clinical Social Worker consult acknowledged:  Holiday representative received consult for SNF placement. CSW noted PT and OT are recommending Home Health services.   Clinical Social Worker will sign off for now as social work intervention is no longer needed. Please consult [place] again if new need arises.  Glendon Axe, MSW, LCSWA [phone] 05/29/2014 3:05 PM

## 2014-05-29 NOTE — Discharge Summary (Signed)
[facility] Discharge Summary  Patient [name] Medical record number: [id number] Date of birth: June 12, 1940 Age: 74 y.o. Gender: female Date of Admission: 05/27/2014  Date of Discharge: 05/30/2014 Admitting Physician: Dickie La, MD  Primary Care Provider: Neale Burly, MD Consultants: Nerology  Indication for Hospitalization:  Stroke  Discharge Diagnoses/Problem List:  Patient Active Problem List   Diagnosis Date Noted  . Other specified transient cerebral ischemias   . Stroke with cerebral ischemia   . Hemispheric carotid artery syndrome   . Essential hypertension   . Hyperlipidemia   . Type 2 diabetes mellitus with other circulatory complications   . CVA (cerebral infarction) 05/27/2014     Disposition: Home with home health  Discharge Condition: Stable  Discharge Exam:   Temp: [98.2 F (36.8 C)-98.8 F (37.1 C)] 98.6 F (37 C) (04/16 0531) Pulse Rate: [55-64] 62 (04/16 0531) Resp: [16-20] 16 (04/16 0531) BP: (144-185)/(72-91) 185/91 mmHg (04/16 0531) SpO2: [98 %-100 %] 98 % (04/16 0531) Physical Exam: General: NAD, lying comfortably in bed HEENT: NCAT, PERRL, EOMI. Cardiovascular: RRR no murmurs Respiratory: CTAB, no wheezes Extremities: No edema, Skin: warm well perfused, no rashes or lesions Neuro: Alert and oriented x4, CN II-XII are normal, no focal deficits in sensation or motor function, strength testing 5/5 bilaterally handgrip, foot extension. Some dysarthria still present   Brief Hospital Course:    Jody [name] is a 74 y.o. female presenting with new onset dysarthria, right hemiparesis, right face weakness . PMH is significant for HTN, DM, Arthritis, and questionable CHF. Presentation concerning for stroke  Stroke versus TIA Her symptoms consistent with stroke vs TIA. On presentation she had dysarthria with right facial droop. By HD 2 her dysarthria and facial droop began to resolve. On admission  she had a CT head which was negative for infarction as well as an MIR/MRA of her brain and head which were negative for infarction. Her imaging was done without contrast secondary to her chronic kidney disease. She had a normal echocardiogram  and carotid dopplers significant for mild to moderate calcific plaque, 1-39% ICA stenosis. She was continued on telemetry without events. She had risk stratifying labs with fasting lipid panel and HgbA1c. LDL was elevated to 84 and lipitor was started. Her Hgb A1C was 5.1 bringing into question her diabetes diagnosis. She ws managed on sliding scale insulin. Neurology was consulted and followed. She was seen by PT/OT and speech and languade pathology with swallow evaluation. The recommendation was made for home health when discharged. She passed her swallow study and started on a dysphagia 3 diet. She was then transitioned to plavix 75 qD from the aspirin suppository prophylaxis she was initiated on at admission. All treatment ooptions were discussed with the patient and her power of attorney ( niece)  AoCKD III: Her creatinine on admission was elevated to 1.82 with her baseline 5 months prior being 1.52. Her Cr did not imporve with IVF but remained stable  Uncertain history of CHF- The pateint and her POA were uncertain about a CHF history uncertain if she has CHF but as she takes lasix and Coreg at home and she endorses orthopnea with lower extremity edema if she does not take these medications, there was concern that she has underlying CHF. She did have an echo which showed normal EF 55-60%.   HTN: She was allowed permissive HTN in the first 24-48 hours . After passing her swallow study she was restarted on her home antihypertensive regimen.  Her hydralazine was changed rom her home 25 BID to 25 TID with improvement in her blood pressures  HLD - She was initially restarted on her home simvastatin but given her risk this was escalated to lipitor 40 qD  Asthma:   Remained stable and she was continued on her  home symbicort  DM:  - She was maintained on sliding scale but give her A1C was normal at 5.1, there is concern that she no longer has diabetes.   Issues for Follow Up:  1. Referral to Cardiology for Holter monitor to rule out arrhythmia 2. Per neurology, carotid doppler ultrasound (unable to get done before discharge) 3. High blood pressure: will likely need titration or additional medications 4. Normal A1C- consider stopping metformin 5. Repeat Bmet to monitor kidney function   Significant Procedures:   Echocardiogram Cartotid dopplers MRI brain/MRA head CT head   Significant Labs and Imaging:   Recent Labs Lab 05/27/14 1827 05/27/14 1835  WBC 5.3  --   HGB 8.6* 9.5*  HCT 26.7* 28.0*  PLT 192  --     Recent Labs Lab 05/27/14 1827 05/27/14 1835 05/28/14 0745 05/29/14 1009  NA 139 141 144 143  K 3.9 3.9 3.9 4.1  CL 110 110 113* 111  CO2 20  --  19 24  GLUCOSE 115* 115* 112* 132*  BUN 29* 29* 22 23  CREATININE 1.82* 1.80* 1.56* 1.90*  CALCIUM 8.6  --  8.8 8.7  ALKPHOS 69  --   --   --   AST 16  --   --   --   ALT 13  --   --   --   ALBUMIN 3.1*  --   --   --       Results/Tests Pending at Time of Discharge: None  Discharge Medications:    Medication List    ASK your doctor about these medications        acetaminophen 500 MG tablet  Commonly known as:  TYLENOL  Take 500 mg by mouth every 6 (six) hours as needed for mild pain.     aspirin 325 MG tablet  Take 325 mg by mouth daily.     benzonatate 100 MG capsule  Commonly known as:  TESSALON  Take 1 capsule (100 mg total) by mouth every 8 (eight) hours.     carvedilol 3.125 MG tablet  Commonly known as:  COREG  Take 1 tablet by mouth 2 (two) times daily.     furosemide 40 MG tablet  Commonly known as:  LASIX  Take 40 mg by mouth daily.     gabapentin 100 MG capsule  Commonly known as:  NEURONTIN  Take 1 capsule (100 mg total) by mouth 3  (three) times daily.     hydrALAZINE 25 MG tablet  Commonly known as:  APRESOLINE  Take 1 tablet by mouth 2 (two) times daily.     HYDROcodone-acetaminophen 5-325 MG per tablet  Commonly known as:  NORCO/VICODIN  Take 1 tablet by mouth at bedtime.     HYDROcodone-acetaminophen 7.5-325 MG per tablet  Commonly known as:  NORCO  Take 1 tablet by mouth 3 (three) times daily.     metFORMIN 1000 MG tablet  Commonly known as:  GLUCOPHAGE  Take 1 tablet by mouth 2 (two) times daily.     naproxen sodium 220 MG tablet  Commonly known as:  ANAPROX  Take 220 mg by mouth 2 (two) times daily with a meal.  OVER THE COUNTER MEDICATION  Take 1 tablet by mouth at bedtime. "restful legs"     simvastatin 40 MG tablet  Commonly known as:  ZOCOR  Take 40 mg by mouth every evening.     SYMBICORT 160-4.5 MCG/ACT inhaler  Generic drug:  budesonide-formoterol  Inhale 2 puffs into the lungs 2 (two) times daily.     traMADol 50 MG tablet  Commonly known as:  ULTRAM  Take 1 tablet by mouth 3 (three) times daily as needed.        Discharge Instructions: Please refer to Patient Instructions section of EMR for full details.  Patient was counseled important signs and symptoms that should prompt return to medical care, changes in medications, dietary instructions, activity restrictions, and follow up appointments.   Follow-Up Appointments:     Follow-up Information    Follow up with Xu,Jindong, MD. Schedule an appointment as soon as possible for a visit in 2 months.   Specialty:  Neurology   Why:  stroke clinic   Contact information:   [address] [place] [address] [phone]       Veatrice Bourbon, MD 05/29/2014, 4:42 PM PGY-1, [place]

## 2014-05-30 DIAGNOSIS — G458 Other transient cerebral ischemic attacks and related syndromes: Secondary | ICD-10-CM

## 2014-05-30 LAB — GLUCOSE, CAPILLARY
GLUCOSE-CAPILLARY: 126 mg/dL — AB (ref 70–99)
Glucose-Capillary: 135 mg/dL — ABNORMAL HIGH (ref 70–99)

## 2014-05-30 LAB — BASIC METABOLIC PANEL
Anion gap: 9 (ref 5–15)
BUN: 31 mg/dL — AB (ref 6–23)
CALCIUM: 8.3 mg/dL — AB (ref 8.4–10.5)
CO2: 21 mmol/L (ref 19–32)
Chloride: 108 mmol/L (ref 96–112)
Creatinine, Ser: 1.89 mg/dL — ABNORMAL HIGH (ref 0.50–1.10)
GFR calc Af Amer: 29 mL/min — ABNORMAL LOW (ref >90)
GFR calc non Af Amer: 25 mL/min — ABNORMAL LOW (ref >90)
Glucose, Bld: 142 mg/dL — ABNORMAL HIGH (ref 70–99)
POTASSIUM: 3.8 mmol/L (ref 3.5–5.1)
Sodium: 138 mmol/L (ref 135–145)

## 2014-05-30 MED ORDER — ATORVASTATIN CALCIUM 40 MG PO TABS: 40.0000 mg | ORAL_TABLET | Freq: Every day | ORAL | Status: DC

## 2014-05-30 MED ORDER — CLOPIDOGREL BISULFATE 75 MG PO TABS: 75.0000 mg | ORAL_TABLET | Freq: Every day | ORAL | Status: DC

## 2014-05-30 MED ORDER — LISINOPRIL 2.5 MG PO TABS: 2.5000 mg | ORAL_TABLET | Freq: Every day | ORAL | Status: DC

## 2014-05-30 NOTE — Progress Notes (Signed)
Occupational Therapy Treatment Patient Details [name] MRN: [id number] DOB: [birth year] Today's Date: 05/30/2014    History of present illness 74 y.o. female with a past medical history significant for HTN, DM, brought in by EMS as a code stroke due to acute onset of dysarthria, right hemiparesis, right face weakness. NIHSS 7 on admission. MRI (-).   OT comments  Pt seen today to address cognition, RUE functional use and strengthening, and balance. Pt continues to require supervision for ADLs and functional mobility. Family was presents for education on need for 24/7 Supervision. Provided pt with HEP for RUE strengthening. Acute OT to continue with POC.    Follow Up Recommendations  Home health OT;Supervision/Assistance - 24 hour    Equipment Recommendations  Tub/shower bench    Recommendations for Other Services      Precautions / Restrictions Precautions Precautions: Fall Restrictions Weight Bearing Restrictions: No       Mobility Bed Mobility Overal bed mobility: Modified Independent                Transfers Overall transfer level: Needs assistance Equipment used: Rolling walker (2 wheeled) Transfers: Sit to/from Stand Sit to Stand: Supervision         General transfer comment: No physical assist required, VCs for safety and hand placement        ADL Overall ADL's : Needs assistance/impaired     Grooming: Set up;Supervision/safety;Standing           Upper Body Dressing : Set up;Sitting   Lower Body Dressing: Supervision/safety;Set up;Sit to/from stand   Toilet Transfer: Supervision/safety;Ambulation;RW   Toileting- Clothing Manipulation and Hygiene: Supervision/safety;Sit to/from stand       Functional mobility during ADLs: Supervision/safety;Rolling walker General ADL Comments: Pt continues to use RW somewhat unsafely, walking away from it, however progressed with further use. Family present and educated on need for 24/7  Supervision.                 Cognition  Arousal/Alertness: Awake/Alert Behavior During Therapy: WFL for tasks assessed/performed Overall Cognitive Status: Impaired/Different from baseline Area of Impairment: Attention;Safety/judgement   Current Attention Level: Selective      Safety/Judgement: Decreased awareness of safety     General Comments: Pt still unable to answer "who do you call if there's a fire" and pt responded "3-1-1" with time. Pt also unable to recall family member's phone number, which she dials regularly.       Exercises Other Exercises Other Exercises: Educated pt and family on shoulder weakness and cautioned against hiking shoulder to compensate. Provided pt with HEP for RUE exercises and pt performed with Supervision. Family verbalized understanding. HEP includes: scapular elevation/depression, scapular retraction, shoulder forward flexion, shoulder abduction and digit HEP (thumb to finger tips; flat palm with single digit extension off surface)           Pertinent Vitals/ Pain       Pain Assessment: No/denies pain         Frequency Min 3X/week     Progress Toward Goals  OT Goals(current goals can now be found in the care plan section)  Progress towards OT goals: Progressing toward goals  Acute Rehab OT Goals Patient Stated Goal: home today  Plan Discharge plan remains appropriate       End of Session Equipment Utilized During Treatment: Rolling walker   Activity Tolerance Patient tolerated treatment well   Patient Left in bed;with call bell/phone within reach;with family/visitor present   Nurse Communication  Time: [id number] OT Time Calculation (min): 41 min  Charges: OT General Charges $OT Visit: 1 Procedure OT Treatments $Self Care/Home Management : 8-22 mins $Therapeutic Activity: 8-22 mins $Therapeutic Exercise: 8-22 mins  Juluis Rainier 05/30/2014, 2:31 PM  Secundino Ginger Lynetta Mare, OTR/L Occupational  Therapist [phone] (pager)

## 2014-05-30 NOTE — Progress Notes (Signed)
VASCULAR LAB PRELIMINARY  PRELIMINARY  PRELIMINARY  PRELIMINARY  Carotid Dopplers completed.    Preliminary report:  1-39% ICA stenosis.  Vertebral artery flow is antegrade.   Jamiee Milholland, RVT 05/30/2014, 11:21 AM

## 2014-05-30 NOTE — Discharge Instructions (Signed)
You were admitted for a Transient Ischemic Attack (TIA), or what is often called a Mini-stroke. The CT and MRI imaging did not show any evidence of a stroke. You were evaluated by a neurologist, and started on some additional medications below:  Atorvastatin (cholesterol medicine) -- this replaces your simvastatin, so STOP the simvastatin Clopidogrel (antiplatelet medicine) -- this replaces your aspirin, take to help decrease your risk of stroke in the future  When you go home schedule an appointment with your primary doctor ASAP. You need to ask your doctor to refer you for the following tests: Carotid doppler ultrasound 30 day Holter monitoring (to look for abnormal rhythm of the hear that may have caused your TIA)  The neurologist would like you to follow up with him in about 2 months. You should call and get this scheduled.

## 2014-05-30 NOTE — Progress Notes (Signed)
Family Medicine Teaching Service Daily Progress Note Intern Pager: [phone]  Patient [name] Medical record number: [id number] Date of birth: [birth year] Age: 74 y.o. Gender: female  Primary Care Provider: Neale Burly, MD Consultants: neurology Code Status: Full  Pt Overview and Major Events to Date:  4/14: admission for concern for stroke  Assessment and Plan:  Jody [name] is a 74 y.o. female presenting with new onset dysarthria, right hemiparesis, right face weakness . PMH is significant for HTN, DM, Arthritis, CHF?  Right sided hemiparesis/dysarthria: Symptoms consistent with stroke vs TIA with negative CT head and MRI/MRA brain. Imaging done without contrast 2/2 to elevated creatinine. She does have known risk factors for stroke with DM, and HTN despite negative work up this far. Risk strat: Lipid panel LDL 84. A1c 5.1. Echo EF 55-60% (mild/mod MV regurg, LA mildly dilated) - Neuro following, signed off - S/p PT/OT/SLP - Fall precautions - Consider outpatient 30d holter monitor to further rule out arrythmia - plavix started  AoCKD III: Cr elevated to 1.82 with GFR 26 today from Cr 1.58 5 months ago. Pt unaware of kidney disease history. UA with no signs of infection but showing some protein. Most likely 2/2 to her DM.  - Continue to monitor and trend BMPs  Uncertain history of CHF- Pt and POA uncertain if she has CHF. Pt endorses baseline orthopnea with occasional swelling of BLE making history of CHF likely. But given normal lung exam with no LE edema noted unlikely to be having an acute exacerbation.. She currently takes lasix 40 mg and coreg 3.125 BID - ECHO is normal  HTN: SBP still elevated 180s - restarted coreg and hydralazine, lisinopril 2.5mg   HLD - switched simva to atorvastatin  Asthma: stable, no wheezing on exam.  - Continue home symbicort  DM: A1c 5.1 - SSI  - hold home metformin  FEN/GI:  -Dysphagia 3 diet  Prophylaxis: subcutaneous  heparin   Disposition: home with home health  Subjective:  No complaints. Would like to go home today. Says she has help and is checked on daily by family/friends.  Objective: Temp:  [98.2 F (36.8 C)-98.8 F (37.1 C)] 98.6 F (37 C) (04/16 0531) Pulse Rate:  [55-64] 62 (04/16 0531) Resp:  [16-20] 16 (04/16 0531) BP: (144-185)/(72-91) 185/91 mmHg (04/16 0531) SpO2:  [98 %-100 %] 98 % (04/16 0531) Physical Exam: General: NAD, lying comfortably in bed HEENT: NCAT, PERRL, EOMI. Cardiovascular: RRR no murmurs Respiratory: CTAB, no wheezes Extremities: No edema, Skin: warm well perfused, no rashes or lesions Neuro: Alert and oriented x4, CN II-XII are normal, no focal deficits in sensation or motor function, strength testing 5/5 bilaterally handgrip, foot extension. Some dysarthria still present.   Laboratory:  Recent Labs Lab 05/27/14 1827 05/27/14 1835  WBC 5.3  --   HGB 8.6* 9.5*  HCT 26.7* 28.0*  PLT 192  --     Recent Labs Lab 05/27/14 1827 05/27/14 1835 05/28/14 0745 05/29/14 1009  NA 139 141 144 143  K 3.9 3.9 3.9 4.1  CL 110 110 113* 111  CO2 20  --  19 24  BUN 29* 29* 22 23  CREATININE 1.82* 1.80* 1.56* 1.90*  CALCIUM 8.6  --  8.8 8.7  PROT 6.3  --   --   --   BILITOT 0.7  --   --   --   ALKPHOS 69  --   --   --   ALT 13  --   --   --  AST 16  --   --   --   GLUCOSE 115* 115* 112* 132*      Imaging/Diagnostic Tests: 4/13  MRI HEAD FINDINGS  The diffusion-weighted images demonstrate no evidence for acute or subacute infarction. Confluent periventricular white matter changes are evident bilaterally. There are remote lacunar infarcts of the basal ganglia bilaterally. No significant cortical infarct is present. The ventricles are of normal size.  No acute hemorrhage or mass lesion is present. Flow is present in the major intracranial arteries. The globes and orbits are intact the paranasal sinuses and mastoid air cells are clear.  No significant extraaxial fluid collection is present. Midline structures are within normal limits the brain. Advanced degenerative changes are present at the C4-5 level.  MRA HEAD FINDINGS  Atherosclerotic irregularity is present within the internal carotid arteries bilaterally. There is mild moderate narrowing of the mid left A1 segment. The anterior communicating artery is patent. The M1 segments are within normal limits bilaterally. The MCA bifurcations are normal. There is mild attenuation of distal ACA and MCA branch vessels bilaterally. Tortuosity is noted in the cervical internal carotid arteries.  The vertebral arteries are codominant. The left PICA origin is visualized and normal. The right AICA is dominant. The basilar artery is tortuous without focal stenosis. Both posterior cerebral arteries originate from the basilar tip. There is some attenuation of distal PCA branch vessels bilaterally.  CT head w/o contrast  IMPRESSION: Moderate chronic small vessel ischemic change. No CT findings of acute infarct. No hemorrhage.  These results were called by telephone at the time of interpretation on 05/27/2014 at 6:42 pm to Dr. Armida Sans , who verbally acknowledged these results.   Leone Brand, MD 05/30/2014, 7:28 AM PGY-2 [place]

## 2014-05-30 NOTE — Progress Notes (Signed)
Pt is being discharged home with home health. Discharge instructions were given to patient and family

## 2014-05-30 NOTE — Progress Notes (Signed)
CARE MANAGEMENT NOTE 05/30/2014  Patient:  Jody [name], Jody [name]   Account Number:  [id number]  Date Initiated:  05/28/2014  Documentation initiated by:  Lorne Skeens  Subjective/Objective Assessment:   Patient was admitted with dysarthria, weakness.  Lives at home alone     Action/Plan:   Will follow for discharge needs pending PT/OT evals and physician orders.   Anticipated DC Date:  05/30/2014   Anticipated DC Plan:  [place]  CM consult      [facility] Choice  HOME HEALTH   Choice offered to / List presented to:  C-1 Patient        [place] arranged  HH-1 RN  [place].   Status of service:  Completed, signed off Medicare Important Message given?  YES (If response is "NO", the following Medicare IM given date fields will be blank) Date Medicare IM given:  05/29/2014 Medicare IM given by:  Lorne Skeens Date Additional Medicare IM given:   Additional Medicare IM given by:    Discharge Disposition:  [place]  Per UR Regulation:  Reviewed for med. necessity/level of care/duration of stay  If discussed at [place] of Stay Meetings, dates discussed:    Comments:  [name] (Niece)  [phone] 05/30/2014 1500 NCM spoke pt and gave permission to speak to niece, [name] POA. Pt is refusing SNF placement at this time. Niece is concerned pt will be at home alone. She has the option to stay with her sister temp but pt wants to go home. Pt has RW and 3n1 bedside commode at home. Pt states she was independent prior to hospital stay and feels confident she can care for herself at home. Offered HH and pt states she had [facility] in the past. Notified [facility] for [facility] for scheduled dc home today. Contacted [facility] DME and pt will have to pay out of pocket for tub bench. Jonnie Finner RN CCM Case Mgmt phone [phone]  05/29/14 [place] RN, MSN, CM-  Medicare IM letter provided.

## 2014-07-30 ENCOUNTER — Ambulatory Visit: Payer: Medicare HMO | Admitting: Neurology

## 2014-07-30 ENCOUNTER — Telehealth: Payer: Self-pay

## 2014-07-30 NOTE — Telephone Encounter (Signed)
Patient cancelled one hour prior to appointment.

## 2014-08-20 ENCOUNTER — Ambulatory Visit: Payer: Medicare HMO | Admitting: Neurology

## 2014-09-02 ENCOUNTER — Ambulatory Visit (INDEPENDENT_AMBULATORY_CARE_PROVIDER_SITE_OTHER): Payer: Medicare HMO | Admitting: Neurology

## 2014-09-02 ENCOUNTER — Encounter: Payer: Self-pay | Admitting: Neurology

## 2014-09-02 VITALS — BP 139/85 | HR 67 | Ht 66.0 in | Wt 192.2 lb

## 2014-09-02 DIAGNOSIS — G451 Carotid artery syndrome (hemispheric): Secondary | ICD-10-CM

## 2014-09-02 DIAGNOSIS — G459 Transient cerebral ischemic attack, unspecified: Secondary | ICD-10-CM | POA: Insufficient documentation

## 2014-09-02 DIAGNOSIS — R269 Unspecified abnormalities of gait and mobility: Secondary | ICD-10-CM

## 2014-09-02 DIAGNOSIS — I480 Paroxysmal atrial fibrillation: Secondary | ICD-10-CM

## 2014-09-02 HISTORY — DX: Unspecified abnormalities of gait and mobility: R26.9

## 2014-09-02 NOTE — Patient Instructions (Signed)
Stroke Prevention Some medical conditions and behaviors are associated with an increased chance of having a stroke. You may prevent a stroke by making healthy choices and managing medical conditions. HOW CAN I REDUCE MY RISK OF HAVING A STROKE?   Stay physically active. Get at least 30 minutes of activity on most or all days.  Do not smoke. It may also be helpful to avoid exposure to secondhand smoke.  Limit alcohol use. Moderate alcohol use is considered to be:  No more than 2 drinks per day for men.  No more than 1 drink per day for nonpregnant women.  Eat healthy foods. This involves:  Eating 5 or more servings of fruits and vegetables a day.  Making dietary changes that address high blood pressure (hypertension), high cholesterol, diabetes, or obesity.  Manage your cholesterol levels.  Making food choices that are high in fiber and low in saturated fat, trans fat, and cholesterol may control cholesterol levels.  Take any prescribed medicines to control cholesterol as directed by your health care provider.  Manage your diabetes.  Controlling your carbohydrate and sugar intake is recommended to manage diabetes.  Take any prescribed medicines to control diabetes as directed by your health care provider.  Control your hypertension.  Making food choices that are low in salt (sodium), saturated fat, trans fat, and cholesterol is recommended to manage hypertension.  Take any prescribed medicines to control hypertension as directed by your health care provider.  Maintain a healthy weight.  Reducing calorie intake and making food choices that are low in sodium, saturated fat, trans fat, and cholesterol are recommended to manage weight.  Stop drug abuse.  Avoid taking birth control pills.  Talk to your health care provider about the risks of taking birth control pills if you are over 35 years old, smoke, get migraines, or have ever had a blood clot.  Get evaluated for sleep  disorders (sleep apnea).  Talk to your health care provider about getting a sleep evaluation if you snore a lot or have excessive sleepiness.  Take medicines only as directed by your health care provider.  For some people, aspirin or blood thinners (anticoagulants) are helpful in reducing the risk of forming abnormal blood clots that can lead to stroke. If you have the irregular heart rhythm of atrial fibrillation, you should be on a blood thinner unless there is a good reason you cannot take them.  Understand all your medicine instructions.  Make sure that other conditions (such as anemia or atherosclerosis) are addressed. SEEK IMMEDIATE MEDICAL CARE IF:   You have sudden weakness or numbness of the face, arm, or leg, especially on one side of the body.  Your face or eyelid droops to one side.  You have sudden confusion.  You have trouble speaking (aphasia) or understanding.  You have sudden trouble seeing in one or both eyes.  You have sudden trouble walking.  You have dizziness.  You have a loss of balance or coordination.  You have a sudden, severe headache with no known cause.  You have new chest pain or an irregular heartbeat. Any of these symptoms may represent a serious problem that is an emergency. Do not wait to see if the symptoms will go away. Get medical help at once. Call your local emergency services (911 in [place] Do not drive yourself to the hospital. Document Released: 03/09/2004 Document Revised: 06/16/2013 Document Reviewed: 08/02/2012 ExitCare Patient Information 2015 [employer]. This information is not intended to replace advice given   to you by your health care provider. Make sure you discuss any questions you have with your health care provider.  

## 2014-09-02 NOTE — Progress Notes (Signed)
Reason for visit: TIA/followup  Referring physician: [facility]  Jody [name] is a 74 y.o. female  History of present illness:  Jody [name] is a 74 year old right-handed black female with a history of hypertension, and diabetes. The patient has a chronic gait disorder, she currently lives alone. On 05/27/2014, the patient was admitted to the hospital with sudden onset of right-sided weakness and speech problems. The deficits were quite severe initially, but over several hours, she normalized. Stroke workup revealed no acute ischemia by MRI. The patient has extensive confluent white matter disease, however. The patient was noted to have blood pressures greater than 123456 systolic upon admission. The patient has had no residual from the TIA event. She has ongoing problems with weakness in the legs, she reports some numbness in the hands and feet associated with her diabetic neuropathy. She has a chronic gait disorder, and uses a cane for ambulation, she has fallen on occasion. She is to get physical therapy in the home environment in the near future. Prior to the admission, she was on aspirin, now she is on Plavix. The 2D echo studies were unremarkable. No cardiogenic source of embolus was noted. A prolonged cardiac monitor was recommended upon discharge, this has not yet been done. The patient denies any headache, dizziness, speech troubles, or swallowing problems. She does have a mild memory issue, she denies any difficulty controlling the bladder.  Past Medical History  Diagnosis Date  . Arthritis   . Diabetes mellitus without complication   . Hypertension   . Heart disease   . Gait difficulty 09/02/2014    Past Surgical History  Procedure Laterality Date  . Abdominal surgery    . Joint replacement    . Tumor removed-stomach      Family History  Problem Relation Age of Onset  . Cancer Mother     Social history:  reports that she has quit smoking. She does not have any  smokeless tobacco history on file. She reports that she does not drink alcohol or use illicit drugs.  Medications:  Prior to Admission medications   Medication Sig Start Date End Date Taking? Authorizing Provider  acetaminophen (TYLENOL) 500 MG tablet Take 500 mg by mouth every 6 (six) hours as needed for mild pain.    Historical Provider, MD  atorvastatin (LIPITOR) 40 MG tablet Take 1 tablet (40 mg total) by mouth daily at 6 PM. 05/30/14   Leone Brand, MD  carvedilol (COREG) 3.125 MG tablet Take 1 tablet by mouth 2 (two) times daily. 10/14/13   Historical Provider, MD  clopidogrel (PLAVIX) 75 MG tablet Take 1 tablet (75 mg total) by mouth daily. 05/30/14   Leone Brand, MD  furosemide (LASIX) 40 MG tablet Take 40 mg by mouth daily.  10/13/13   Historical Provider, MD  gabapentin (NEURONTIN) 100 MG capsule Take 1 capsule (100 mg total) by mouth 3 (three) times daily. 12/02/13   Tanna Furry, MD  hydrALAZINE (APRESOLINE) 25 MG tablet Take 1 tablet by mouth 2 (two) times daily. 10/13/13   Historical Provider, MD  HYDROcodone-acetaminophen (NORCO) 7.5-325 MG per tablet Take 1 tablet by mouth 3 (three) times daily. 05/01/14   Historical Provider, MD  lisinopril (PRINIVIL,ZESTRIL) 2.5 MG tablet Take 1 tablet (2.5 mg total) by mouth daily. 05/30/14   Leone Brand, MD  metFORMIN (GLUCOPHAGE) 1000 MG tablet Take 1 tablet by mouth 2 (two) times daily. 10/13/13   Historical Provider, MD  naproxen sodium (ANAPROX) 220 MG  tablet Take 220 mg by mouth 2 (two) times daily with a meal.    Historical Provider, MD  OVER THE COUNTER MEDICATION Take 1 tablet by mouth at bedtime. "restful legs"    Historical Provider, MD  SYMBICORT 160-4.5 MCG/ACT inhaler Inhale 2 puffs into the lungs 2 (two) times daily. 10/13/13   Historical Provider, MD  traMADol (ULTRAM) 50 MG tablet Take 1 tablet by mouth 3 (three) times daily as needed. 08/31/13   Historical Provider, MD     No Known Allergies  ROS:  Out of a complete 14 system  review of symptoms, the patient complains only of the following symptoms, and all other reviewed systems are negative.  Weight gain Palpitations of the heart, swelling in the legs Birthmark Shortness of breath, cough, wheezing, snoring Urination problems Easy bruising Increased thirst Joint pain, joint swelling, muscle cramps, aching muscles Runny nose Headache, numbness, weakness, slurred speech Decreased energy, change in appetite, disinterest in activities Insomnia, restless legs  Blood pressure 139/85, pulse 67, height 5\' 6"  (1.676 m), weight 192 lb 3.2 oz (87.181 kg).  Physical Exam  General: The patient is alert and cooperative at the time of the examination. The patient is moderately obese.  Eyes: Pupils are equal, round, and reactive to light. Discs are flat bilaterally.  Neck: The neck is supple, no carotid bruits are noted.  Respiratory: The respiratory examination is clear.  Cardiovascular: The cardiovascular examination reveals a regular rate and rhythm, no obvious murmurs or rubs are noted.  Skin: Extremities are without significant edema.  Neurologic Exam  Mental status: The patient is alert and oriented x 3 at the time of the examination. The patient has apparent normal recent and remote memory, with an apparently normal attention span and concentration ability.  Cranial nerves: Facial symmetry is present. There is good sensation of the face to pinprick and soft touch bilaterally. The strength of the facial muscles and the muscles to head turning and shoulder shrug are normal bilaterally. Speech is well enunciated, no aphasia or dysarthria is noted. Extraocular movements are full. Visual fields are full. The tongue is midline, and the patient has symmetric elevation of the soft palate. No obvious hearing deficits are noted.  Motor: The motor testing reveals 5 over 5 strength of all 4 extremities. Good symmetric motor tone is noted throughout.  Sensory: Sensory  testing is intact to pinprick, soft touch, vibration sensation, and position sense on all 4 extremities, with exception of a stocking pattern pinprick sensory deficit up to the knees bilaterally.. No evidence of extinction is noted.  Coordination: Cerebellar testing reveals good finger-nose-finger and heel-to-shin bilaterally.  Gait and station: Gait is normal. Tandem gait is normal. Romberg is negative. No drift is seen.  Reflexes: Deep tendon reflexes are symmetric, but are depressed bilaterally. Toes are downgoing bilaterally.   Ct Head Wo Contrast 05/27/2014 Moderate chronic small vessel ischemic change. No CT findings of acute infarct. No hemorrhage.   MRI HEAD  05/27/2014  No evidence for acute or subacute infarction. remote lacunar infarcts of the basal ganglia bilaterally. No acute hemorrhage or mass. Confluent chronic white matter changes are present.  * MRI scan images were reviewed online. I agree with the written report.   MRA HEAD  05/27/2014  Atherosclerotic irregularity is present within the internal carotid arteries bilaterally. There is mild moderate narrowing of the mid left A1 segment. The anterior communicating artery is patent. The M1 segments are within normal limits bilaterally. The MCA bifurcations are normal. There  is mild attenuation of distal ACA and MCA branch vessels bilaterally. Tortuosity is noted in the cervical internal carotid arteries. The vertebral arteries are codominant. The left PICA origin is visualized and normal. The right AICA is dominant. The basilar artery is tortuous without focal stenosis. Both posterior cerebral arteries originate from the basilar tip. There is some attenuation of distal PCA branch vessels bilaterally.   CUS - Preliminary report: 1-39% ICA stenosis. Vertebral artery flow is antegrade.    2D echo 05/29/2014 Study Conclusions - Left ventricle: The cavity size was normal. Systolic function was normal. The  estimated ejection fraction was in the range of 55% to 60%. Wall motion was normal; there were no regional wall motion abnormalities. Left ventricular diastolic function parameters were normal. - Aortic valve: There was trivial regurgitation. - Mitral valve: There was mild to moderate regurgitation directed centrally. - Left atrium: The atrium was mildly dilated.   Assessment/Plan:  1. TIA event, referable to the left brain  2. Hypertension  3. Chronic cerebral small vessel changes  4. Gait disorder, chronic  5. Mild memory disturbance  The patient had a TIA event in April 2016. The patient will be set up for a prolonged cardiac monitor for 30 days. She will remain on Plavix for now. She is now monitoring her blood pressures on a daily basis at home, and her blood pressures have been better controlled, systolic blood pressures are in the 130 range. The patient will be getting home health physical therapy. The chronic small vessel disease is likely a contributing factor to the gait disorder, and the mild memory problem. The patient will follow-up through this office on an as-needed basis. Anticoagulation may be indicated if atrial fibrillation is noted on the prolonged cardiac monitor.  Jill Alexanders MD 09/02/2014 8:02 PM  [facility] [address] [place], [place] [address]  Phone [phone] Fax [phone]

## 2014-12-07 ENCOUNTER — Observation Stay (HOSPITAL_COMMUNITY)
Admission: EM | Admit: 2014-12-07 | Discharge: 2014-12-11 | Disposition: A | Discharge status: Home / Self Care | Payer: Medicare HMO | Attending: Family Medicine | Admitting: Family Medicine

## 2014-12-07 ENCOUNTER — Encounter (HOSPITAL_COMMUNITY): Payer: Self-pay | Admitting: Emergency Medicine

## 2014-12-07 ENCOUNTER — Emergency Department (HOSPITAL_COMMUNITY): Payer: Medicare HMO

## 2014-12-07 DIAGNOSIS — E1159 Type 2 diabetes mellitus with other circulatory complications: Secondary | ICD-10-CM | POA: Diagnosis present

## 2014-12-07 DIAGNOSIS — I10 Essential (primary) hypertension: Secondary | ICD-10-CM | POA: Diagnosis not present

## 2014-12-07 DIAGNOSIS — I472 Ventricular tachycardia, unspecified: Secondary | ICD-10-CM

## 2014-12-07 DIAGNOSIS — I4891 Unspecified atrial fibrillation: Secondary | ICD-10-CM | POA: Diagnosis present

## 2014-12-07 DIAGNOSIS — I639 Cerebral infarction, unspecified: Secondary | ICD-10-CM | POA: Diagnosis present

## 2014-12-07 DIAGNOSIS — N179 Acute kidney failure, unspecified: Secondary | ICD-10-CM

## 2014-12-07 HISTORY — DX: Type 2 diabetes mellitus without complications: E11.9

## 2014-12-07 HISTORY — DX: Chronic obstructive pulmonary disease, unspecified: J44.9

## 2014-12-07 HISTORY — DX: Unspecified chronic bronchitis: J42

## 2014-12-07 HISTORY — DX: Cerebral infarction, unspecified: I63.9

## 2014-12-07 HISTORY — DX: Dorsalgia, unspecified: M54.9

## 2014-12-07 HISTORY — DX: Pure hypercholesterolemia, unspecified: E78.00

## 2014-12-07 HISTORY — DX: Other chronic pain: G89.29

## 2014-12-07 LAB — TROPONIN I
Troponin I: 0.09 ng/mL — ABNORMAL HIGH (ref <0.031)
Troponin I: 0.12 ng/mL — ABNORMAL HIGH (ref <0.031)

## 2014-12-07 LAB — COMPREHENSIVE METABOLIC PANEL
ALBUMIN: 3.5 g/dL (ref 3.5–5.0)
ALT: 29 U/L (ref 14–54)
ANION GAP: 10 (ref 5–15)
AST: 33 U/L (ref 15–41)
Alkaline Phosphatase: 78 U/L (ref 38–126)
BUN: 52 mg/dL — ABNORMAL HIGH (ref 6–20)
CO2: 18 mmol/L — AB (ref 22–32)
Calcium: 8.7 mg/dL — ABNORMAL LOW (ref 8.9–10.3)
Chloride: 112 mmol/L — ABNORMAL HIGH (ref 101–111)
Creatinine, Ser: 1.97 mg/dL — ABNORMAL HIGH (ref 0.44–1.00)
GFR calc Af Amer: 27 mL/min — ABNORMAL LOW (ref >60)
GFR calc non Af Amer: 23 mL/min — ABNORMAL LOW (ref >60)
GLUCOSE: 109 mg/dL — AB (ref 65–99)
POTASSIUM: 4.6 mmol/L (ref 3.5–5.1)
Sodium: 140 mmol/L (ref 135–145)
TOTAL PROTEIN: 6.7 g/dL (ref 6.5–8.1)
Total Bilirubin: 0.7 mg/dL (ref 0.3–1.2)

## 2014-12-07 LAB — CBC WITH DIFFERENTIAL/PLATELET
BASOS PCT: 0 %
Basophils Absolute: 0 10*3/uL (ref 0.0–0.1)
EOS ABS: 0.2 10*3/uL (ref 0.0–0.7)
EOS PCT: 4 %
HCT: 24.1 % — ABNORMAL LOW (ref 36.0–46.0)
Hemoglobin: 7.9 g/dL — ABNORMAL LOW (ref 12.0–15.0)
Lymphocytes Relative: 22 %
Lymphs Abs: 1.3 10*3/uL (ref 0.7–4.0)
MCH: 30.9 pg (ref 26.0–34.0)
MCHC: 32.8 g/dL (ref 30.0–36.0)
MCV: 94.1 fL (ref 78.0–100.0)
Monocytes Absolute: 0.4 10*3/uL (ref 0.1–1.0)
Monocytes Relative: 7 %
NEUTROS PCT: 67 %
Neutro Abs: 3.9 10*3/uL (ref 1.7–7.7)
PLATELETS: 170 10*3/uL (ref 150–400)
RBC: 2.56 MIL/uL — ABNORMAL LOW (ref 3.87–5.11)
RDW: 15.6 % — AB (ref 11.5–15.5)
WBC: 5.8 10*3/uL (ref 4.0–10.5)

## 2014-12-07 LAB — RETICULOCYTES
RBC.: 2.58 MIL/uL — ABNORMAL LOW (ref 3.87–5.11)
Retic Count, Absolute: 110.9 10*3/uL (ref 19.0–186.0)
Retic Ct Pct: 4.3 % — ABNORMAL HIGH (ref 0.4–3.1)

## 2014-12-07 LAB — BRAIN NATRIURETIC PEPTIDE: B Natriuretic Peptide: 1242 pg/mL — ABNORMAL HIGH (ref 0.0–100.0)

## 2014-12-07 LAB — PROTIME-INR
INR: 1.21 (ref 0.00–1.49)
Prothrombin Time: 15.4 seconds — ABNORMAL HIGH (ref 11.6–15.2)

## 2014-12-07 LAB — TSH: TSH: 1.459 u[IU]/mL (ref 0.350–4.500)

## 2014-12-07 MED ORDER — SODIUM CHLORIDE 0.9 % IV SOLN
INTRAVENOUS | Status: DC
  Administered 2014-12-07 – 2014-12-08 (×2): via INTRAVENOUS

## 2014-12-07 MED ORDER — HEPARIN (PORCINE) IN NACL 100-0.45 UNIT/ML-% IJ SOLN
1100.0000 [IU]/h | INTRAMUSCULAR | Status: DC
  Administered 2014-12-07: 1100 [IU]/h via INTRAVENOUS
  Filled 2014-12-07: qty 250

## 2014-12-07 MED ORDER — INSULIN ASPART 100 UNIT/ML ~~LOC~~ SOLN
0.0000 [IU] | Freq: Every day | SUBCUTANEOUS | Status: DC
  Administered 2014-12-08 – 2014-12-10 (×2): 2 [IU] via SUBCUTANEOUS

## 2014-12-07 MED ORDER — CLOPIDOGREL BISULFATE 75 MG PO TABS
75.0000 mg | ORAL_TABLET | Freq: Every day | ORAL | Status: DC
  Administered 2014-12-08 – 2014-12-11 (×4): 75 mg via ORAL
  Filled 2014-12-07 (×4): qty 1

## 2014-12-07 MED ORDER — DULOXETINE HCL 60 MG PO CPEP
60.0000 mg | ORAL_CAPSULE | Freq: Every day | ORAL | Status: DC
  Administered 2014-12-08 – 2014-12-11 (×4): 60 mg via ORAL
  Filled 2014-12-07 (×4): qty 1

## 2014-12-07 MED ORDER — SODIUM CHLORIDE 0.9 % IJ SOLN
3.0000 mL | Freq: Two times a day (BID) | INTRAMUSCULAR | Status: DC
  Administered 2014-12-08 – 2014-12-10 (×5): 3 mL via INTRAVENOUS
  Filled 2014-12-07: qty 3

## 2014-12-07 MED ORDER — PREGABALIN 50 MG PO CAPS
50.0000 mg | ORAL_CAPSULE | Freq: Every day | ORAL | Status: DC
  Administered 2014-12-08 – 2014-12-11 (×4): 50 mg via ORAL
  Filled 2014-12-07 (×4): qty 1

## 2014-12-07 MED ORDER — METFORMIN HCL 500 MG PO TABS
1000.0000 mg | ORAL_TABLET | Freq: Two times a day (BID) | ORAL | Status: DC
  Filled 2014-12-07: qty 2

## 2014-12-07 MED ORDER — INSULIN ASPART 100 UNIT/ML ~~LOC~~ SOLN
0.0000 [IU] | Freq: Three times a day (TID) | SUBCUTANEOUS | Status: DC
  Administered 2014-12-08 – 2014-12-09 (×2): 2 [IU] via SUBCUTANEOUS
  Administered 2014-12-09: 1 [IU] via SUBCUTANEOUS
  Administered 2014-12-09: 5 [IU] via SUBCUTANEOUS
  Administered 2014-12-10: 3 [IU] via SUBCUTANEOUS
  Administered 2014-12-10: 2 [IU] via SUBCUTANEOUS
  Administered 2014-12-11: 1 [IU] via SUBCUTANEOUS

## 2014-12-07 MED ORDER — FUROSEMIDE 40 MG PO TABS
40.0000 mg | ORAL_TABLET | Freq: Every day | ORAL | Status: DC
  Administered 2014-12-08: 40 mg via ORAL
  Filled 2014-12-07: qty 1

## 2014-12-07 MED ORDER — ALBUTEROL SULFATE HFA 108 (90 BASE) MCG/ACT IN AERS: 1.0000 | INHALATION_SPRAY | Freq: Four times a day (QID) | RESPIRATORY_TRACT | Status: DC | PRN

## 2014-12-07 MED ORDER — ATORVASTATIN CALCIUM 40 MG PO TABS
40.0000 mg | ORAL_TABLET | Freq: Every day | ORAL | Status: DC
  Administered 2014-12-08 – 2014-12-10 (×3): 40 mg via ORAL
  Filled 2014-12-07 (×3): qty 1

## 2014-12-07 MED ORDER — AMLODIPINE BESYLATE 5 MG PO TABS
5.0000 mg | ORAL_TABLET | Freq: Every day | ORAL | Status: DC
  Administered 2014-12-08: 5 mg via ORAL
  Filled 2014-12-07: qty 1

## 2014-12-07 MED ORDER — HYDRALAZINE HCL 25 MG PO TABS
25.0000 mg | ORAL_TABLET | Freq: Two times a day (BID) | ORAL | Status: DC
  Administered 2014-12-08 – 2014-12-11 (×8): 25 mg via ORAL
  Filled 2014-12-07 (×8): qty 1

## 2014-12-07 MED ORDER — DILTIAZEM HCL 100 MG IV SOLR
5.0000 mg/h | INTRAVENOUS | Status: DC
  Administered 2014-12-07: 5 mg/h via INTRAVENOUS
  Filled 2014-12-07: qty 100

## 2014-12-07 MED ORDER — ADULT MULTIVITAMIN W/MINERALS CH
1.0000 | ORAL_TABLET | Freq: Every day | ORAL | Status: DC
  Administered 2014-12-08 – 2014-12-11 (×4): 1 via ORAL
  Filled 2014-12-07 (×3): qty 1

## 2014-12-07 MED ORDER — CARVEDILOL 12.5 MG PO TABS
12.5000 mg | ORAL_TABLET | Freq: Two times a day (BID) | ORAL | Status: DC
Start: 2014-12-08 — End: 2014-12-08
  Administered 2014-12-08: 12.5 mg via ORAL
  Filled 2014-12-07: qty 1

## 2014-12-07 MED ORDER — NAPROXEN 250 MG PO TABS: 250.0000 mg | ORAL_TABLET | Freq: Every day | ORAL | Status: DC | PRN

## 2014-12-07 MED ORDER — HEPARIN BOLUS VIA INFUSION
3000.0000 [IU] | Freq: Once | INTRAVENOUS | Status: AC
  Administered 2014-12-07: 3000 [IU] via INTRAVENOUS

## 2014-12-07 MED ORDER — BUDESONIDE-FORMOTEROL FUMARATE 160-4.5 MCG/ACT IN AERO: 2.0000 | INHALATION_SPRAY | Freq: Every day | RESPIRATORY_TRACT | Status: DC | PRN

## 2014-12-07 MED ORDER — ALBUTEROL SULFATE (2.5 MG/3ML) 0.083% IN NEBU
3.0000 mL | INHALATION_SOLUTION | Freq: Four times a day (QID) | RESPIRATORY_TRACT | Status: DC | PRN
  Administered 2014-12-08: 3 mL via RESPIRATORY_TRACT
  Filled 2014-12-07: qty 3

## 2014-12-07 MED ORDER — DILTIAZEM LOAD VIA INFUSION
10.0000 mg | Freq: Once | INTRAVENOUS | Status: AC
  Administered 2014-12-07: 10 mg via INTRAVENOUS
  Filled 2014-12-07: qty 10

## 2014-12-07 NOTE — Progress Notes (Signed)
ANTICOAGULATION CONSULT NOTE - Initial Consult  Pharmacy Consult for Heparin Indication: atrial fibrillation  Allergies  Allergen Reactions  . Codeine Nausea And Vomiting    Patient Measurements: Height: 5\' 7"  (170.2 cm) Weight: 189 lb (85.73 kg) IBW/kg (Calculated) : 61.6 HEPARIN DW (KG): 79.6  Vital Signs: Temp: 98.2 F (36.8 C) (10/24 1744) Temp Source: Oral (10/24 1744) BP: 179/77 mmHg (10/24 1845) Pulse Rate: 54 (10/24 1845)  Labs:  Recent Labs  12/07/14 1650  HGB 7.9*  HCT 24.1*  PLT 170  CREATININE 1.97*  TROPONINI 0.09*    Estimated Creatinine Clearance: 27.3 mL/min (by C-G formula based on Cr of 1.97).   Medical History: Past Medical History  Diagnosis Date  . Arthritis   . Diabetes mellitus without complication ([place])   . Hypertension   . Heart disease   . Gait difficulty 09/02/2014  . Stroke [facility])     Medications:   (Not in a hospital admission)  Home Meds Reviewed  Assessment:  Okay for Protocol, low Hg noted, chronic anemia noted on H&P.  Baseline coags pending this admission ([facility] last lab draw in [facility]).  Goal of Therapy:  Heparin level 0.3-0.7 units/ml Monitor platelets by anticoagulation protocol: Yes   Plan:  Give 3000 units bolus x 1 Start heparin infusion at 1100 units/hr Check anti-Xa level in 6-8 hours and daily while on heparin Continue to monitor H&H and platelets  F/U long term AC plan  Pricilla Larsson 12/07/2014,7:45 PM

## 2014-12-07 NOTE — Progress Notes (Signed)
PHARMACIST - PHYSICIAN COMMUNICATION DR:   CONCERNING:  METFORMIN SAFE ADMINISTRATION POLICY  RECOMMENDATION: Metformin has been placed on DISCONTINUE (rejected order) STATUS and should be reordered only after any of the conditions below are ruled out.  Current safety recommendations include avoiding metformin for a minimum of 48 hours after the patient's exposure to intravenous contrast media.  DESCRIPTION:  The Pharmacy Committee has adopted a policy that restricts the use of metformin in hospitalized patients until all the following conditions have been ruled out.  Specific contraindications are:  _0  eGFR is below 30 ml/minute _1  Planned administration of intravenous iodinated contrast media _2  Acute or chronic metabolic acidosis (including DKA)  _3  Shock, acute MI, sepsis, hypoxemia, dehydration _4  Heart Failure patients with low EF       Wynona Neat, PharmD, BCPS  12/07/2014 11:34 PM

## 2014-12-07 NOTE — ED Provider Notes (Signed)
afib appears to have resolved cardizem off Pt with some bradycardia - 40-50s, but pt denying complaints Hypertension noted  ED ECG REPORT   Date: 12/07/2014 1918  Rate: 50  Rhythm: indeterminate  QRS Axis: normal  Intervals: normal  ST/T Wave abnormalities: nonspecific ST changes  Conduction Disutrbances:none  Narrative Interpretation:   Old EKG Reviewed: changes noted  I have personally reviewed the EKG tracing and agree with the computerized printout as noted.   Ripley Fraise, MD 12/07/14 [phone]

## 2014-12-07 NOTE — ED Notes (Signed)
Patient ambulatory to restroom  ?

## 2014-12-07 NOTE — H&P (Signed)
[facility] History and Physical  [name] [id number] DOB: [birth year] DOA: 12/07/2014  Referring physician: ER PCP: Monico Blitz, MD   Chief Complaint: Palpitations, dyspnea  HPI: Jody [name] is a 74 y.o. female  This is a 74 year old lady who presents with a 3 to four-day history of palpitations, associated with dyspnea, PND, wheezing and cough. This appears to be worsening and she now presents to the emergency room. Evaluation in the emergency room found her to be in atrial fibrillation with rapid ventricular response. She has been started on a Cardizem drip. When I came to see her in the room, soon after, she went into a bradycardia in the region of 45/m. She is asymptomatic with this. She is now being admitted for further investigation and management.   Review of Systems:  Apart from symptoms above, all systems are negative.  Past Medical History  Diagnosis Date  . Arthritis   . Diabetes mellitus without complication ([place])   . Hypertension   . Heart disease   . Gait difficulty 09/02/2014  . Stroke [facility])    Past Surgical History  Procedure Laterality Date  . Abdominal surgery    . Joint replacement    . Tumor removed-stomach     Social History:  reports that she has quit smoking. She does not have any smokeless tobacco history on file. She reports that she does not drink alcohol or use illicit drugs.  Allergies  Allergen Reactions  . Codeine Nausea And Vomiting    Family History  Problem Relation Age of Onset  . Cancer Mother   . Cancer Other       Prior to Admission medications   Medication Sig Start Date End Date Taking? Authorizing Provider  albuterol (PROVENTIL HFA;VENTOLIN HFA) 108 (90 BASE) MCG/ACT inhaler Inhale 1-2 puffs into the lungs every 6 (six) hours as needed for wheezing or shortness of breath.   Yes Historical Provider, MD  amLODipine (NORVASC) 5 MG tablet Take 5 mg by mouth daily.   Yes Historical Provider, MD  atorvastatin  (LIPITOR) 40 MG tablet Take 1 tablet (40 mg total) by mouth daily at 6 PM. 05/30/14  Yes Leone Brand, MD  carvedilol (COREG) 12.5 MG tablet Take 12.5 mg by mouth 2 (two) times daily with a meal.   Yes Historical Provider, MD  clopidogrel (PLAVIX) 75 MG tablet Take 1 tablet (75 mg total) by mouth daily. 05/30/14  Yes Leone Brand, MD  DULoxetine (CYMBALTA) 60 MG capsule Take 60 mg by mouth daily.   Yes Historical Provider, MD  furosemide (LASIX) 40 MG tablet Take 40 mg by mouth daily.  10/13/13  Yes Historical Provider, MD  hydrALAZINE (APRESOLINE) 25 MG tablet Take 1 tablet by mouth 2 (two) times daily. 10/13/13  Yes Historical Provider, MD  metFORMIN (GLUCOPHAGE) 1000 MG tablet Take 1 tablet by mouth 2 (two) times daily. 10/13/13  Yes Historical Provider, MD  Multiple Vitamin (MULTIVITAMIN WITH MINERALS) TABS tablet Take 1 tablet by mouth daily.   Yes Historical Provider, MD  naproxen sodium (ANAPROX) 220 MG tablet Take 220-440 mg by mouth daily as needed (for back pain).    Yes Historical Provider, MD  pregabalin (LYRICA) 50 MG capsule Take 50 mg by mouth daily.   Yes Historical Provider, MD  SYMBICORT 160-4.5 MCG/ACT inhaler Inhale 2 puffs into the lungs daily as needed (for shortness of breath).  10/13/13  Yes Historical Provider, MD   Physical Exam: Filed Vitals:   12/07/14 1744 12/07/14 1800 12/07/14 1820  12/07/14 1845  BP: 166/96 173/112  179/77  Pulse: 63 52 126 54  Temp: 98.2 F (36.8 C)     TempSrc: Oral     Resp: 15 19 19 16   Height:      Weight:      SpO2: 94% 96% 95% 96%    Wt Readings from Last 3 Encounters:  12/07/14 85.73 kg (189 lb)  09/02/14 87.181 kg (192 lb 3.2 oz)  05/27/14 89.858 kg (198 lb 1.6 oz)    General:  Appears calm and comfortable. She is hemodynamically stable. Eyes: PERRL, normal lids, irises & conjunctiva ENT: grossly normal hearing, lips & tongue Neck: no LAD, masses or thyromegaly Cardiovascular: Irregularly irregular, consistent with atrial  fibrillation. She does not appear to be clinically in heart failure at the present time. Telemetry: Atrial fibrillation. Respiratory: CTA bilaterally, no w/r/r. Normal respiratory effort. Abdomen: soft, ntnd Skin: no rash or induration seen on limited exam Musculoskeletal: grossly normal tone BUE/BLE Psychiatric: grossly normal mood and affect, speech fluent and appropriate Neurologic: grossly non-focal.          Labs on Admission:  Basic Metabolic Panel:  Recent Labs Lab 12/07/14 1650  NA 140  K 4.6  CL 112*  CO2 18*  GLUCOSE 109*  BUN 52*  CREATININE 1.97*  CALCIUM 8.7*   Liver Function Tests:  Recent Labs Lab 12/07/14 1650  AST 33  ALT 29  ALKPHOS 78  BILITOT 0.7  PROT 6.7  ALBUMIN 3.5   No results for input(s): LIPASE, AMYLASE in the last 168 hours. No results for input(s): AMMONIA in the last 168 hours. CBC:  Recent Labs Lab 12/07/14 1650  WBC 5.8  NEUTROABS 3.9  HGB 7.9*  HCT 24.1*  MCV 94.1  PLT 170   Cardiac Enzymes:  Recent Labs Lab 12/07/14 1650  TROPONINI 0.09*    BNP (last 3 results)  Recent Labs  12/07/14 1650  BNP 1242.0*    ProBNP (last 3 results) No results for input(s): PROBNP in the last 8760 hours.  CBG: No results for input(s): GLUCAP in the last 168 hours.  Radiological Exams on Admission: Dg Chest 2 View  12/07/2014  CLINICAL DATA:  Shortness of breath, chest tightness, weakness x1 week EXAM: CHEST  2 VIEW COMPARISON:  08/24/2014 FINDINGS: Chronic interstitial markings. Mild left basilar scarring. No focal consolidation. No pleural effusion or pneumothorax. Cardiomegaly. Degenerative changes of the visualized thoracolumbar spine. IMPRESSION: No evidence of acute cardiopulmonary disease. Electronically Signed   By: Julian Hy M.D.   On: 12/07/2014 17:40    EKG: Independently reviewed. Atrial fibrillation with rapid ventricle response. There are some ST segment depressions which I am not sure are acute and may  be rate dependent.  Assessment/Plan   1. Atrial fibrillation with rapid ventricular response. She was started on Cardizem drip. Current rhythm is bradycardic. We will switch off the Cardizem drip and monitor closely. Since her symptoms started 3-4 days ago, I suspect her atrial fibrillation started then and I will start her on intravenous heparin for anticoagulation I will order echocardiogram. Serial cardiac enzymes. Cardiology consultation in the morning. 2. Hypertension. Stable. Continue home medications. 3. Diabetes. Continue with home medications and sliding scale insulin. 4. Anemia. This is chronic. There is no acute bleeding clinically. I will order an anemia panel.  She'll be admitted to the stepdown unit. Further recommendations will depend on patient's hospital progress.   Code Status: Full code.  DVT Prophylaxis: IV heparin.  Family Communication: I discussed  the plan with the patient at the bedside.   Disposition Plan: Home when medically stable.   Time spent: 60 minutes.  Doree Albee [facility] Pager [phone].

## 2014-12-07 NOTE — ED Notes (Signed)
Pt reports SOB, chest tightness and weakness x 1 week.

## 2014-12-07 NOTE — ED Provider Notes (Signed)
CSN: [id number]     Arrival date & time 12/07/14  1633 History   First MD Initiated Contact with Patient 12/07/14 1635     Chief Complaint  Patient presents with  . Shortness of Breath    Patient is a 74 y.o. female presenting with shortness of breath. The history is provided by the patient.  Shortness of Breath Severity:  Moderate Onset quality:  Gradual Duration:  1 week Timing:  Intermittent Progression:  Worsening Chronicity:  New Relieved by:  Nothing Worsened by:  Activity Associated symptoms: cough, PND and wheezing   Associated symptoms: no chest pain, no fever, no hemoptysis, no syncope and no vomiting   Risk factors: no tobacco use    Pt reports for past week she has had increasing cough/SOB She reports orthopnea for past 3 days She reports chest tightness but no pain She reports generalized fatigue She reports LE edema Past Medical History  Diagnosis Date  . Arthritis   . Diabetes mellitus without complication ([place])   . Hypertension   . Heart disease   . Gait difficulty 09/02/2014  . Stroke [facility])    Past Surgical History  Procedure Laterality Date  . Abdominal surgery    . Joint replacement    . Tumor removed-stomach     Family History  Problem Relation Age of Onset  . Cancer Mother   . Cancer Other    Social History  Substance Use Topics  . Smoking status: Former Research scientist (life sciences)  . Smokeless tobacco: None  . Alcohol Use: No   OB History    No data available     Review of Systems  Constitutional: Negative for fever.  Respiratory: Positive for cough, shortness of breath and wheezing. Negative for hemoptysis.   Cardiovascular: Positive for leg swelling and PND. Negative for chest pain and syncope.  Gastrointestinal: Negative for vomiting and blood in stool.  Genitourinary: Negative for hematuria.  Neurological: Negative for syncope.  All other systems reviewed and are negative.     Allergies  Review of patient's allergies indicates no known  allergies.  Home Medications   Prior to Admission medications   Medication Sig Start Date End Date Taking? Authorizing Provider  acetaminophen (TYLENOL) 500 MG tablet Take 500 mg by mouth every 6 (six) hours as needed for mild pain.    Historical Provider, MD  atorvastatin (LIPITOR) 40 MG tablet Take 1 tablet (40 mg total) by mouth daily at 6 PM. 05/30/14   Leone Brand, MD  carvedilol (COREG) 3.125 MG tablet Take 1 tablet by mouth 2 (two) times daily. 10/14/13   Historical Provider, MD  clopidogrel (PLAVIX) 75 MG tablet Take 1 tablet (75 mg total) by mouth daily. 05/30/14   Leone Brand, MD  furosemide (LASIX) 40 MG tablet Take 40 mg by mouth daily.  10/13/13   Historical Provider, MD  gabapentin (NEURONTIN) 100 MG capsule Take 1 capsule (100 mg total) by mouth 3 (three) times daily. 12/02/13   Tanna Furry, MD  hydrALAZINE (APRESOLINE) 25 MG tablet Take 1 tablet by mouth 2 (two) times daily. 10/13/13   Historical Provider, MD  HYDROcodone-acetaminophen (NORCO) 7.5-325 MG per tablet Take 1 tablet by mouth 3 (three) times daily. 05/01/14   Historical Provider, MD  lisinopril (PRINIVIL,ZESTRIL) 2.5 MG tablet Take 1 tablet (2.5 mg total) by mouth daily. 05/30/14   Leone Brand, MD  metFORMIN (GLUCOPHAGE) 1000 MG tablet Take 1 tablet by mouth 2 (two) times daily. 10/13/13   Historical Provider, MD  naproxen sodium (ANAPROX) 220 MG tablet Take 220 mg by mouth 2 (two) times daily with a meal.    Historical Provider, MD  OVER THE COUNTER MEDICATION Take 1 tablet by mouth at bedtime. "restful legs"    Historical Provider, MD  SYMBICORT 160-4.5 MCG/ACT inhaler Inhale 2 puffs into the lungs 2 (two) times daily. 10/13/13   Historical Provider, MD  traMADol (ULTRAM) 50 MG tablet Take 1 tablet by mouth 3 (three) times daily as needed. 08/31/13   Historical Provider, MD   Ht 5\' 7"  (1.702 m)  Wt 189 lb (85.73 kg)  BMI 29.59 kg/m2 Physical Exam CONSTITUTIONAL: Well developed/well nourished HEAD:  Normocephalic/atraumatic EYES: EOMI/PERRL ENMT: Mucous membranes moist NECK: supple no meningeal signs, +JVD SPINE/BACK:entire spine nontender CV: tachycardic and irregular LUNGS: bibasilar crackles, no apparent distress ABDOMEN: soft, nontender, no rebound or guarding, bowel sounds noted throughout abdomen GU:no cva tenderness NEURO: Pt is awake/alert/appropriate, moves all extremitiesx4.  No facial droop.   EXTREMITIES: pulses normal/equal, full ROM SKIN: warm, color normal PSYCH: no abnormalities of mood noted, alert and oriented to situation  ED Course  Procedures  CRITICAL CARE Performed by: Sharyon Cable Total critical care time: 35 Critical care time was exclusive of separately billable procedures and treating other patients. Critical care was necessary to treat or prevent imminent or life-threatening deterioration. Critical care was time spent personally by me on the following activities: development of treatment plan with patient and/or surrogate as well as nursing, discussions with consultants, evaluation of patient's response to treatment, examination of patient, obtaining history from patient or surrogate, ordering and performing treatments and interventions, ordering and review of laboratory studies, ordering and review of radiographic studies, pulse oximetry and re-evaluation of patient's condition. PATIENT WITH NEW ONSET OF ATRIAL FIBRILLATION REQUIRING IV BOLUS AND DRIP OF CARDIZEM   5:33 PM Pt reports h/o CHF and previous CVA but no h/o afib noted Will follow closely 6:18 PM  Pt appears to have new onset afib at this time She will require IV drip of cardizem No significant pulmonary edema at this time D/w dr Anastasio Champion, will admit and may need txfer to [place] Pt with anemia but appears chronic and denies recent blood loss  Labs Review Labs Reviewed  CBC WITH DIFFERENTIAL/PLATELET - Abnormal; Notable for the following:    RBC 2.56 (*)    Hemoglobin 7.9 (*)     HCT 24.1 (*)    RDW 15.6 (*)    All other components within normal limits  COMPREHENSIVE METABOLIC PANEL - Abnormal; Notable for the following:    Chloride 112 (*)    CO2 18 (*)    Glucose, Bld 109 (*)    BUN 52 (*)    Creatinine, Ser 1.97 (*)    Calcium 8.7 (*)    GFR calc non Af Amer 23 (*)    GFR calc Af Amer 27 (*)    All other components within normal limits  TROPONIN I - Abnormal; Notable for the following:    Troponin I 0.09 (*)    All other components within normal limits  BRAIN NATRIURETIC PEPTIDE - Abnormal; Notable for the following:    B Natriuretic Peptide 1242.0 (*)    All other components within normal limits    Imaging Review Dg Chest 2 View  12/07/2014  CLINICAL DATA:  Shortness of breath, chest tightness, weakness x1 week EXAM: CHEST  2 VIEW COMPARISON:  08/24/2014 FINDINGS: Chronic interstitial markings. Mild left basilar scarring. No focal consolidation. No pleural  effusion or pneumothorax. Cardiomegaly. Degenerative changes of the visualized thoracolumbar spine. IMPRESSION: No evidence of acute cardiopulmonary disease. Electronically Signed   By: Julian Hy M.D.   On: 12/07/2014 17:40   I have personally reviewed and evaluated these images and lab results as part of my medical decision-making.   EKG Interpretation   Date/Time:  Monday December 07 2014 16:43:13 EDT Ventricular Rate:  132 PR Interval:    QRS Duration: 83 QT Interval:  326 QTC Calculation: 483 R Axis:   31 Text Interpretation:  Atrial fibrillation Nonspecific repol abnormality,  lateral leads Baseline wander in lead(s) III aVL aVF Abnormal ekg  Confirmed by Christy Gentles  MD, Elenore Rota ([id number]) on 12/07/2014 5:03:31 PM     Medications  diltiazem (CARDIZEM) 1 mg/mL load via infusion 10 mg (not administered)    And  diltiazem (CARDIZEM) 100 mg in dextrose 5 % 100 mL (1 mg/mL) infusion (not administered)     MDM   Final diagnoses:  Atrial fibrillation with rapid ventricular response  (HCC)  AKI (acute kidney injury) [facility])    Nursing notes including past medical history and social history reviewed and considered in documentation xrays/imaging reviewed by myself and considered during evaluation Labs/vital reviewed myself and considered during evaluation Previous records reviewed and considered     Ripley Fraise, MD 12/07/14 1819

## 2014-12-08 ENCOUNTER — Inpatient Hospital Stay (HOSPITAL_COMMUNITY): Payer: Medicare HMO

## 2014-12-08 DIAGNOSIS — J441 Chronic obstructive pulmonary disease with (acute) exacerbation: Secondary | ICD-10-CM | POA: Diagnosis not present

## 2014-12-08 DIAGNOSIS — I10 Essential (primary) hypertension: Secondary | ICD-10-CM | POA: Diagnosis not present

## 2014-12-08 DIAGNOSIS — I472 Ventricular tachycardia: Secondary | ICD-10-CM | POA: Diagnosis not present

## 2014-12-08 DIAGNOSIS — E1159 Type 2 diabetes mellitus with other circulatory complications: Secondary | ICD-10-CM | POA: Diagnosis not present

## 2014-12-08 DIAGNOSIS — I272 Other secondary pulmonary hypertension: Secondary | ICD-10-CM | POA: Diagnosis not present

## 2014-12-08 DIAGNOSIS — I4891 Unspecified atrial fibrillation: Secondary | ICD-10-CM

## 2014-12-08 DIAGNOSIS — R0602 Shortness of breath: Secondary | ICD-10-CM

## 2014-12-08 DIAGNOSIS — J440 Chronic obstructive pulmonary disease with acute lower respiratory infection: Secondary | ICD-10-CM | POA: Diagnosis not present

## 2014-12-08 DIAGNOSIS — I5031 Acute diastolic (congestive) heart failure: Secondary | ICD-10-CM

## 2014-12-08 DIAGNOSIS — R0609 Other forms of dyspnea: Secondary | ICD-10-CM | POA: Diagnosis not present

## 2014-12-08 LAB — FERRITIN: Ferritin: 52 ng/mL (ref 11–307)

## 2014-12-08 LAB — IRON AND TIBC
Iron: 45 ug/dL (ref 28–170)
SATURATION RATIOS: 13 % (ref 10.4–31.8)
TIBC: 343 ug/dL (ref 250–450)
UIBC: 298 ug/dL

## 2014-12-08 LAB — GLUCOSE, CAPILLARY
GLUCOSE-CAPILLARY: 110 mg/dL — AB (ref 65–99)
Glucose-Capillary: 114 mg/dL — ABNORMAL HIGH (ref 65–99)
Glucose-Capillary: 174 mg/dL — ABNORMAL HIGH (ref 65–99)
Glucose-Capillary: 249 mg/dL — ABNORMAL HIGH (ref 65–99)

## 2014-12-08 LAB — HEPARIN LEVEL (UNFRACTIONATED): Heparin Unfractionated: 0.46 IU/mL (ref 0.30–0.70)

## 2014-12-08 LAB — MRSA PCR SCREENING: MRSA BY PCR: NEGATIVE

## 2014-12-08 LAB — VITAMIN B12: VITAMIN B 12: 306 pg/mL (ref 180–914)

## 2014-12-08 MED ORDER — IPRATROPIUM-ALBUTEROL 0.5-2.5 (3) MG/3ML IN SOLN
3.0000 mL | Freq: Four times a day (QID) | RESPIRATORY_TRACT | Status: DC
  Filled 2014-12-08: qty 3

## 2014-12-08 MED ORDER — INFLUENZA VAC SPLIT QUAD 0.5 ML IM SUSY
0.5000 mL | PREFILLED_SYRINGE | INTRAMUSCULAR | Status: AC
  Administered 2014-12-11: 0.5 mL via INTRAMUSCULAR
  Filled 2014-12-08: qty 0.5

## 2014-12-08 MED ORDER — IPRATROPIUM BROMIDE 0.02 % IN SOLN: 0.5000 mg | Freq: Four times a day (QID) | RESPIRATORY_TRACT | Status: DC

## 2014-12-08 MED ORDER — ENSURE ENLIVE PO LIQD
237.0000 mL | Freq: Two times a day (BID) | ORAL | Status: DC
  Administered 2014-12-08 – 2014-12-11 (×6): 237 mL via ORAL

## 2014-12-08 MED ORDER — DILTIAZEM HCL ER COATED BEADS 240 MG PO CP24
240.0000 mg | ORAL_CAPSULE | Freq: Every day | ORAL | Status: DC
  Administered 2014-12-08 – 2014-12-11 (×4): 240 mg via ORAL
  Filled 2014-12-08 (×4): qty 1

## 2014-12-08 MED ORDER — AMLODIPINE BESYLATE 2.5 MG PO TABS
2.5000 mg | ORAL_TABLET | Freq: Every day | ORAL | Status: DC
  Administered 2014-12-09 – 2014-12-11 (×3): 2.5 mg via ORAL
  Filled 2014-12-08 (×3): qty 1

## 2014-12-08 MED ORDER — IPRATROPIUM BROMIDE 0.02 % IN SOLN: 0.5000 mg | Freq: Four times a day (QID) | RESPIRATORY_TRACT | Status: DC | PRN

## 2014-12-08 MED ORDER — ACETAMINOPHEN 325 MG PO TABS
650.0000 mg | ORAL_TABLET | Freq: Four times a day (QID) | ORAL | Status: DC | PRN
  Administered 2014-12-08 – 2014-12-10 (×3): 650 mg via ORAL
  Filled 2014-12-08 (×3): qty 2

## 2014-12-08 MED ORDER — OFF THE BEAT BOOK
Freq: Once | Status: AC
  Administered 2014-12-08: 11:00:00
  Filled 2014-12-08: qty 1

## 2014-12-08 MED ORDER — LEVALBUTEROL HCL 0.63 MG/3ML IN NEBU: 0.6300 mg | INHALATION_SOLUTION | Freq: Four times a day (QID) | RESPIRATORY_TRACT | Status: DC | PRN

## 2014-12-08 MED ORDER — APIXABAN 2.5 MG PO TABS
2.5000 mg | ORAL_TABLET | Freq: Two times a day (BID) | ORAL | Status: DC
  Administered 2014-12-08 – 2014-12-11 (×7): 2.5 mg via ORAL
  Filled 2014-12-08 (×7): qty 1

## 2014-12-08 MED ORDER — LEVALBUTEROL HCL 0.63 MG/3ML IN NEBU: 0.6300 mg | INHALATION_SOLUTION | Freq: Four times a day (QID) | RESPIRATORY_TRACT | Status: DC

## 2014-12-08 MED ORDER — LEVOFLOXACIN 500 MG PO TABS
500.0000 mg | ORAL_TABLET | ORAL | Status: DC
  Administered 2014-12-08 – 2014-12-10 (×2): 500 mg via ORAL
  Filled 2014-12-08 (×2): qty 1

## 2014-12-08 MED ORDER — PREDNISONE 20 MG PO TABS
50.0000 mg | ORAL_TABLET | Freq: Every day | ORAL | Status: AC
  Administered 2014-12-08 – 2014-12-11 (×4): 50 mg via ORAL
  Filled 2014-12-08: qty 2
  Filled 2014-12-08: qty 1
  Filled 2014-12-08: qty 2
  Filled 2014-12-08: qty 1
  Filled 2014-12-08: qty 2
  Filled 2014-12-08: qty 1
  Filled 2014-12-08 (×2): qty 2
  Filled 2014-12-08: qty 1

## 2014-12-08 NOTE — Progress Notes (Signed)
  Echocardiogram 2D Echocardiogram has been performed.  Jody [name] 12/08/2014, 9:17 AM

## 2014-12-08 NOTE — Care Management Note (Addendum)
Case Management Note  Patient Details  [name] MRN: [id number] Date of Birth: [birth year]  Subjective/Objective:   Pt admitted for A Fib RVR- Initiated on Cardizem gtt-NSB and gtt d/c. Pt is from home. Benefits check was in process for Eliquis and copay will be $$7.40- prior auth not required.                 Action/Plan: CM referral received for PCS services. CM will speak with pt in regards to co pay and services.    Expected Discharge Date:                  Expected Discharge Plan:  [place]  In-House Referral:     Discharge planning Services  CM Consult  Post Acute Care Choice:   N/A Choice offered to:   N/A  DME Arranged:   N/A DME Agency:   N/A  HH Arranged:   N/A HH Agency:   N/A  Status of Service:  Completed Medicare Important Message Given:    Date Medicare IM Given:    Medicare IM give by:    Date Additional Medicare IM Given:    Additional Medicare Important Message give by:     If discussed at [place] of Stay Meetings, dates discussed:    Additional Comments: [place], RN,BSN [phone] Pt will not need HH Services at this time. Pt will d/c with the [facility] Vest. No further needs from CM at this time.     [place], RN,BSN [phone] CM did speak with pt in regards to referral for PCS- pt states she would not be able to pay for services out of pocket. Per pt she is doing outpatient therapy at this time, however it costs $20.00 each visit and that is expensive. CM did suggest that [place] may be beneficial due to cost and transportation. Per pt her Niece takes her to appointments. Eliquis is available at [facility] in [place]. Will continue to monitor for additional home needs.   Bethena Roys, RN 12/08/2014, 1:48 PM

## 2014-12-08 NOTE — Progress Notes (Signed)
Nutrition Brief Note  Patient identified on the Malnutrition Screening Tool (MST) Report for weight loss of 30 lbs in the past year. Per review of usual weights below, patient has not lost any weight in the past year.  Wt Readings from Last 15 Encounters:  12/07/14 195 lb (88.451 kg)  09/02/14 192 lb 3.2 oz (87.181 kg)  05/27/14 198 lb 1.6 oz (89.858 kg)  12/02/13 192 lb (87.091 kg)    Body mass index is 30.53 kg/(m^2). Patient meets criteria for obesity based on current BMI.   Current diet order is heart healthy, patient is consuming approximately 100% of meals at this time. Labs and medications reviewed.   No nutrition interventions warranted at this time. If nutrition issues arise, please consult RD.   Molli Barrows, RD, LDN, [place] Pager [phone] After Hours Pager [phone]

## 2014-12-08 NOTE — Progress Notes (Signed)
UR Completed Bevely Hackbart Graves-Bigelow, RN,BSN [phone]  

## 2014-12-08 NOTE — Discharge Instructions (Addendum)
Atrial Fibrillation °Atrial fibrillation is a type of irregular or rapid heartbeat (arrhythmia). In atrial fibrillation, the heart quivers continuously in a chaotic pattern. This occurs when parts of the heart receive disorganized signals that make the heart unable to pump blood normally. This can increase the risk for stroke, heart failure, and other heart-related conditions. There are different types of atrial fibrillation, including: °· Paroxysmal atrial fibrillation. This type starts suddenly, and it usually stops on its own shortly after it starts. °· Persistent atrial fibrillation. This type often lasts longer than a week. It may stop on its own or with treatment. °· Long-lasting persistent atrial fibrillation. This type lasts longer than 12 months. °· Permanent atrial fibrillation. This type does not go away. °Talk with your health care provider to learn about the type of atrial fibrillation that you have. °CAUSES °This condition is caused by some heart-related conditions or procedures, including: °· A heart attack. °· Coronary artery disease. °· Heart failure. °· Heart valve conditions. °· High blood pressure. °· Inflammation of the sac that surrounds the heart (pericarditis). °· Heart surgery. °· Certain heart rhythm disorders, such as Wolf-Parkinson-White syndrome. °Other causes include: °· Pneumonia. °· Obstructive sleep apnea. °· Blockage of an artery in the lungs (pulmonary embolism, or PE). °· Lung cancer. °· Chronic lung disease. °· Thyroid problems, especially if the thyroid is overactive (hyperthyroidism). °· Caffeine. °· Excessive alcohol use or illegal drug use. °· Use of some medicines, including certain decongestants and diet pills. °Sometimes, the cause cannot be found. °RISK FACTORS °This condition is more likely to develop in: °· People who are older in age. °· People who smoke. °· People who have diabetes mellitus. °· People who are overweight (obese). °· Athletes who exercise  vigorously. °SYMPTOMS °Symptoms of this condition include: °· A feeling that your heart is beating rapidly or irregularly. °· A feeling of discomfort or pain in your chest. °· Shortness of breath. °· Sudden light-headedness or weakness. °· Getting tired easily during exercise. °In some cases, there are no symptoms. °DIAGNOSIS °Your health care provider may be able to detect atrial fibrillation when taking your pulse. If detected, this condition may be diagnosed with: °· An electrocardiogram (ECG). °· A Holter monitor test that records your heartbeat patterns over a 24-hour period. °· Transthoracic echocardiogram (TTE) to evaluate how blood flows through your heart. °· Transesophageal echocardiogram (TEE) to view more detailed images of your heart. °· A stress test. °· Imaging tests, such as a CT scan or chest X-ray. °· Blood tests. °TREATMENT °The main goals of treatment are to prevent blood clots from forming and to keep your heart beating at a normal rate and rhythm. The type of treatment that you receive depends on many factors, such as your underlying medical conditions and how you feel when you are experiencing atrial fibrillation. °This condition may be treated with: °· Medicine to slow down the heart rate, bring the heart's rhythm back to normal, or prevent clots from forming. °· Electrical cardioversion. This is a procedure that resets your heart's rhythm by delivering a controlled, low-energy shock to the heart through your skin. °· Different types of ablation, such as catheter ablation, catheter ablation with pacemaker, or surgical ablation. These procedures destroy the heart tissues that send abnormal signals. When the pacemaker is used, it is placed under your skin to help your heart beat in a regular rhythm. °HOME CARE INSTRUCTIONS °· Take over-the counter and prescription medicines only as told by your health care provider. °·   If your health care provider prescribed a blood-thinning medicine  (anticoagulant), take it exactly as told. Taking too much blood-thinning medicine can cause bleeding. If you do not take enough blood-thinning medicine, you will not have the protection that you need against stroke and other problems.  Do not use tobacco products, including cigarettes, chewing tobacco, and e-cigarettes. If you need help quitting, ask your health care provider.  If you have obstructive sleep apnea, manage your condition as told by your health care provider.  Do not drink alcohol.  Do not drink beverages that contain caffeine, such as coffee, soda, and tea.  Maintain a healthy weight. Do not use diet pills unless your health care provider approves. Diet pills may make heart problems worse.  Follow diet instructions as told by your health care provider.  Exercise regularly as told by your health care provider.  Keep all follow-up visits as told by your health care provider. This is important. PREVENTION  Avoid drinking beverages that contain caffeine or alcohol.  Avoid certain medicines, especially medicines that are used for breathing problems.  Avoid certain herbs and herbal medicines, such as those that contain ephedra or ginseng.  Do not use illegal drugs, such as cocaine and amphetamines.  Do not smoke.  Manage your high blood pressure. SEEK MEDICAL CARE IF:  You notice a change in the rate, rhythm, or strength of your heartbeat.  You are taking an anticoagulant and you notice increased bruising.  You tire more easily when you exercise or exert yourself. SEEK IMMEDIATE MEDICAL CARE IF:  You have chest pain, abdominal pain, sweating, or weakness.  You feel nauseous.  You notice blood in your vomit, bowel movement, or urine.  You have shortness of breath.  You suddenly have swollen feet and ankles.  You feel dizzy.  You have sudden weakness or numbness of the face, arm, or leg, especially on one side of the body.  You have trouble speaking,  trouble understanding, or both (aphasia).  Your face or your eyelid droops on one side. These symptoms may represent a serious problem that is an emergency. Do not wait to see if the symptoms will go away. Get medical help right away. Call your local emergency services (911 in the [place] Do not drive yourself to the hospital.   This information is not intended to replace advice given to you by your health care provider. Make sure you discuss any questions you have with your health care provider.   Document Released: 01/30/2005 Document Revised: 10/21/2014 Document Reviewed: 05/27/2014 Elsevier Interactive Patient Education 2016 [employer]right  Information on my medicine - ELIQUIS (apixaban)  This medication education was reviewed with me or my healthcare representative as part of my discharge preparation.  The pharmacist that spoke with me during my hospital stay was:  Duayne Cal, [facility]  Why was Eliquis prescribed for you? Eliquis was prescribed for you to reduce the risk of a blood clot forming that can cause a stroke if you have a medical condition called atrial fibrillation (a type of irregular heartbeat).  What do You need to know about Eliquis ? Take your Eliquis TWICE DAILY - one tablet in the morning and one tablet in the evening with or without food. If you have difficulty swallowing the tablet whole please discuss with your pharmacist how to take the medication safely.  Take Eliquis exactly as prescribed by your doctor and DO NOT stop taking Eliquis without talking to the doctor who prescribed the medication.  Stopping may increase your risk of developing a stroke.  Refill your prescription before you run out.  After discharge, you should have regular check-up appointments with your healthcare provider that is prescribing your Eliquis.  In the future your dose may need to be changed if your kidney function or weight changes by a significant amount or as you get  older.  What do you do if you miss a dose? If you miss a dose, take it as soon as you remember on the same day and resume taking twice daily.  Do not take more than one dose of ELIQUIS at the same time to make up a missed dose.  Important Safety Information A possible side effect of Eliquis is bleeding. You should call your healthcare provider right away if you experience any of the following: ? Bleeding from an injury or your nose that does not stop. ? Unusual colored urine (red or dark brown) or unusual colored stools (red or black). ? Unusual bruising for unknown reasons. ? A serious fall or if you hit your head (even if there is no bleeding).  Some medicines may interact with Eliquis and might increase your risk of bleeding or clotting while on Eliquis. To help avoid this, consult your healthcare provider or pharmacist prior to using any new prescription or non-prescription medications, including herbals, vitamins, non-steroidal anti-inflammatory drugs (NSAIDs) and supplements.  This website has more information on Eliquis (apixaban): http://www.eliquis.com/eliquis/home Atrial Fibrillation Atrial fibrillation is a type of irregular or rapid heartbeat (arrhythmia). In atrial fibrillation, the heart quivers continuously in a chaotic pattern. This occurs when parts of the heart receive disorganized signals that make the heart unable to pump blood normally. This can increase the risk for stroke, heart failure, and other heart-related conditions. There are different types of atrial fibrillation, including:  Paroxysmal atrial fibrillation. This type starts suddenly, and it usually stops on its own shortly after it starts.  Persistent atrial fibrillation. This type often lasts longer than a week. It may stop on its own or with treatment.  Long-lasting persistent atrial fibrillation. This type lasts longer than 12 months.  Permanent atrial fibrillation. This type does not go away. Talk with  your health care provider to learn about the type of atrial fibrillation that you have. CAUSES This condition is caused by some heart-related conditions or procedures, including:  A heart attack.  Coronary artery disease.  Heart failure.  Heart valve conditions.  High blood pressure.  Inflammation of the sac that surrounds the heart (pericarditis).  Heart surgery.  Certain heart rhythm disorders, such as Wolf-Parkinson-White syndrome. Other causes include:  Pneumonia.  Obstructive sleep apnea.  Blockage of an artery in the lungs (pulmonary embolism, or PE).  Lung cancer.  Chronic lung disease.  Thyroid problems, especially if the thyroid is overactive (hyperthyroidism).  Caffeine.  Excessive alcohol use or illegal drug use.  Use of some medicines, including certain decongestants and diet pills. Sometimes, the cause cannot be found. RISK FACTORS This condition is more likely to develop in:  People who are older in age.  People who smoke.  People who have diabetes mellitus.  People who are overweight (obese).  Athletes who exercise vigorously. SYMPTOMS Symptoms of this condition include:  A feeling that your heart is beating rapidly or irregularly.  A feeling of discomfort or pain in your chest.  Shortness of breath.  Sudden light-headedness or weakness.  Getting tired easily during exercise. In some cases, there are no symptoms. DIAGNOSIS Your health care  provider may be able to detect atrial fibrillation when taking your pulse. If detected, this condition may be diagnosed with:  An electrocardiogram (ECG).  A Holter monitor test that records your heartbeat patterns over a 24-hour period.  Transthoracic echocardiogram (TTE) to evaluate how blood flows through your heart.  Transesophageal echocardiogram (TEE) to view more detailed images of your heart.  A stress test.  Imaging tests, such as a CT scan or chest X-ray.  Blood  tests. TREATMENT The main goals of treatment are to prevent blood clots from forming and to keep your heart beating at a normal rate and rhythm. The type of treatment that you receive depends on many factors, such as your underlying medical conditions and how you feel when you are experiencing atrial fibrillation. This condition may be treated with:  Medicine to slow down the heart rate, bring the heart's rhythm back to normal, or prevent clots from forming.  Electrical cardioversion. This is a procedure that resets your heart's rhythm by delivering a controlled, low-energy shock to the heart through your skin.  Different types of ablation, such as catheter ablation, catheter ablation with pacemaker, or surgical ablation. These procedures destroy the heart tissues that send abnormal signals. When the pacemaker is used, it is placed under your skin to help your heart beat in a regular rhythm. HOME CARE INSTRUCTIONS  Take over-the counter and prescription medicines only as told by your health care provider.  If your health care provider prescribed a blood-thinning medicine (anticoagulant), take it exactly as told. Taking too much blood-thinning medicine can cause bleeding. If you do not take enough blood-thinning medicine, you will not have the protection that you need against stroke and other problems.  Do not use tobacco products, including cigarettes, chewing tobacco, and e-cigarettes. If you need help quitting, ask your health care provider.  If you have obstructive sleep apnea, manage your condition as told by your health care provider.  Do not drink alcohol.  Do not drink beverages that contain caffeine, such as coffee, soda, and tea.  Maintain a healthy weight. Do not use diet pills unless your health care provider approves. Diet pills may make heart problems worse.  Follow diet instructions as told by your health care provider.  Exercise regularly as told by your health care  provider.  Keep all follow-up visits as told by your health care provider. This is important. PREVENTION  Avoid drinking beverages that contain caffeine or alcohol.  Avoid certain medicines, especially medicines that are used for breathing problems.  Avoid certain herbs and herbal medicines, such as those that contain ephedra or ginseng.  Do not use illegal drugs, such as cocaine and amphetamines.  Do not smoke.  Manage your high blood pressure. SEEK MEDICAL CARE IF:  You notice a change in the rate, rhythm, or strength of your heartbeat.  You are taking an anticoagulant and you notice increased bruising.  You tire more easily when you exercise or exert yourself. SEEK IMMEDIATE MEDICAL CARE IF:  You have chest pain, abdominal pain, sweating, or weakness.  You feel nauseous.  You notice blood in your vomit, bowel movement, or urine.  You have shortness of breath.  You suddenly have swollen feet and ankles.  You feel dizzy.  You have sudden weakness or numbness of the face, arm, or leg, especially on one side of the body.  You have trouble speaking, trouble understanding, or both (aphasia).  Your face or your eyelid droops on one side. These symptoms may  represent a serious problem that is an emergency. Do not wait to see if the symptoms will go away. Get medical help right away. Call your local emergency services (911 in the [place] Do not drive yourself to the hospital.   This information is not intended to replace advice given to you by your health care provider. Make sure you discuss any questions you have with your health care provider.   Document Released: 01/30/2005 Document Revised: 10/21/2014 Document Reviewed: 05/27/2014 Elsevier Interactive Patient Education [employer].

## 2014-12-08 NOTE — Progress Notes (Signed)
ANTICOAGULATION CONSULT NOTE - Initial Consult  Pharmacy Consult for apixaban (Eliquis) Indication: atrial fibrillation  Allergies  Allergen Reactions  . Codeine Nausea And Vomiting    Patient Measurements: Height: 5\' 7"  (170.2 cm) Weight: 195 lb (88.451 kg) IBW/kg (Calculated) : 61.6  Vital Signs: Temp: 98 F (36.7 C) (10/25 1107) Temp Source: Oral (10/25 1107) BP: 178/55 mmHg (10/25 1114) Pulse Rate: 59 (10/25 1114)  Labs:  Recent Labs  12/07/14 1650 12/07/14 2002 12/08/14 1003  HGB 7.9*  --   --   HCT 24.1*  --   --   PLT 170  --   --   LABPROT 15.4*  --   --   INR 1.21  --   --   HEPARINUNFRC  --   --  0.46  CREATININE 1.97*  --   --   TROPONINI 0.09* 0.12*  --     Estimated Creatinine Clearance: 27.8 mL/min (by C-G formula based on Cr of 1.97).   Assessment: 74yoF admitted with 3-4 day hx of heart palpitations, dyspnea, cough found to be in afib with RVR. Initially started on hep infusion but plan to transition to apixaban. D/w MD and and based on renal fxn, baseline anemia, and concurrent plavix use plan to start at lower dose of 2.5 mg BID.  Goal of Therapy:  Monitor platelets by anticoagulation protocol: Yes   Plan:  - stop hep and labs assocaited with it and transition to apixiaban 2.5 mg BID - monitor CBC and sxs of bleeding   Thank you for the opportunity to participate in this patients care.  Vincenza Hews, PharmD, BCPS 12/08/2014, 11:51 AM Pager: [phone]

## 2014-12-08 NOTE — Progress Notes (Signed)
PROGRESS NOTE  Jody [name] [id number] DOB: 1941/01/18 DOA: 12/07/2014 PCP: Monico Blitz, MD   HPI: 74 year old lady who presents with a 3 to four-day history of palpitations, associated with dyspnea, PND, wheezing and cough. This appears to be worsening and she now presents to the emergency room. Evaluation in the emergency room found her to be in atrial fibrillation with rapid ventricular response. She has been started on a Cardizem drip. She initially presented on 10/24 to [facility] and because of no SDU beds she was transferred to [facility].   Subjective / 24 H Interval events - she is doing well this morning, denies chest pain / dyspnea / palpitations - no nausea/vomiting  Assessment/Plan: Active Problems:   CVA (cerebral infarction)   Essential hypertension   Type 2 diabetes mellitus with other circulatory complications (HCC)   Atrial fibrillation with rapid ventricular response (HCC)  COPD exacerbation with wheezing, cough - Levaquin, steroids, d/c albuterol and change to Xopenex - Atrovent - supportive O2 if needed  Atrial fibrillation with rapid ventricular response.  - likely related to #1 - CHADs-VASc score is 7, started on anticoagulation, initially on heparin / Coumadin now transitioned to Eliquis per cardiology  - currently on Cardizem, rate controlled  Hypertension - elevated this morning, Cardizem just added, continue hydralazine as well and Norvasc. Up titrate if needed  Diabetes. Continue with home medications and sliding scale insulin.  Anemia. This is chronic. There is no acute bleeding clinically.  Chronic diastolic heart failure - Lasix per cardiology   Diet: Diet Heart Room service appropriate?: Yes; Fluid consistency:: Thin Fluids: none  DVT Prophylaxis: Eliquis  Code Status: Full Code Family Communication: no family bedside  Disposition Plan: home when ready    Consultants:  Cardiology   Procedures:  None     Antibiotics Levofloxacin 10/25 >>   Studies  Dg Chest 2 View  12/07/2014  CLINICAL DATA:  Shortness of breath, chest tightness, weakness x1 week EXAM: CHEST  2 VIEW COMPARISON:  08/24/2014 FINDINGS: Chronic interstitial markings. Mild left basilar scarring. No focal consolidation. No pleural effusion or pneumothorax. Cardiomegaly. Degenerative changes of the visualized thoracolumbar spine. IMPRESSION: No evidence of acute cardiopulmonary disease. Electronically Signed   By: Julian Hy M.D.   On: 12/07/2014 17:40    Objective  Filed Vitals:   12/07/14 2030 12/07/14 2100 12/07/14 2337 12/08/14 0459  BP: 178/78 165/81 176/93 194/94  Pulse:  78 71 63  Temp:  98.4 F (36.9 C) 98.1 F (36.7 C) 98.3 F (36.8 C)  TempSrc:  Oral Oral Oral  Resp: 16 13 22 18   Height:   5\' 7"  (1.702 m)   Weight:   88.451 kg (195 lb)   SpO2:  100% 98% 99%    Intake/Output Summary (Last 24 hours) at 12/08/14 0702 Last data filed at 12/08/14 0500  Gross per 24 hour  Intake  474.8 ml  Output    250 ml  Net  224.8 ml   Filed Weights   12/07/14 1639 12/07/14 2337  Weight: 85.73 kg (189 lb) 88.451 kg (195 lb)    Exam:  GENERAL: NAD  HEENT: head NCAT, no scleral icterus.   NECK: Supple. No LAD  LUNGS: Moves air well. + wheezing, no crackles  HEART: Regular rate and rhythm without murmur. 2+ pulses, no JVD, no peripheral edema  ABDOMEN: Soft, non-distended, non-tender. Positive bowel sounds.  EXTREMITIES: Without any cyanosis or clubbing. Good muscle tone  NEUROLOGIC: Alert and oriented x3. Cranial nerves II through XII  are grossly intact. Strength 5/5 in all 4.   Data Reviewed: Basic Metabolic Panel:  Recent Labs Lab 12/07/14 1650  NA 140  K 4.6  CL 112*  CO2 18*  GLUCOSE 109*  BUN 52*  CREATININE 1.97*  CALCIUM 8.7*   Liver Function Tests:  Recent Labs Lab 12/07/14 1650  AST 33  ALT 29  ALKPHOS 78  BILITOT 0.7  PROT 6.7  ALBUMIN 3.5   CBC:  Recent  Labs Lab 12/07/14 1650  WBC 5.8  NEUTROABS 3.9  HGB 7.9*  HCT 24.1*  MCV 94.1  PLT 170   Cardiac Enzymes:  Recent Labs Lab 12/07/14 1650 12/07/14 2002  TROPONINI 0.09* 0.12*   BNP (last 3 results)  Recent Labs  12/07/14 1650  BNP 1242.0*    Recent Results (from the past 240 hour(s))  MRSA PCR Screening     Status: None   Collection Time: 12/08/14 12:19 AM  Result Value Ref Range Status   MRSA by PCR NEGATIVE NEGATIVE Final    Comment:        The GeneXpert MRSA Assay (FDA approved for NASAL specimens only), is one component of a comprehensive MRSA colonization surveillance program. It is not intended to diagnose MRSA infection nor to guide or monitor treatment for MRSA infections.      Scheduled Meds: . amLODipine  5 mg Oral Daily  . atorvastatin  40 mg Oral q1800  . carvedilol  12.5 mg Oral BID WC  . clopidogrel  75 mg Oral Daily  . DULoxetine  60 mg Oral Daily  . feeding supplement (ENSURE ENLIVE)  237 mL Oral BID BM  . furosemide  40 mg Oral Daily  . hydrALAZINE  25 mg Oral BID  . [START ON 12/09/2014] Influenza vac split quadrivalent PF  0.5 mL Intramuscular Tomorrow-1000  . insulin aspart  0-5 Units Subcutaneous QHS  . insulin aspart  0-9 Units Subcutaneous TID WC  . multivitamin with minerals  1 tablet Oral Daily  . pregabalin  50 mg Oral Daily  . sodium chloride  3 mL Intravenous Q12H   Continuous Infusions: . sodium chloride 75 mL/hr at 12/07/14 2129  . heparin 1,100 Units/hr (12/07/14 2127)    Marzetta Board, MD [facility] Pager [phone]. If 7 PM - 7 AM, please contact night-coverage at [email or web address], password [facility] 12/08/2014, 7:02 AM  LOS: 1 day

## 2014-12-08 NOTE — Consult Note (Signed)
CARDIOLOGY CONSULT NOTE   Patient ID: Jody [name] MRN: [id number], DOB/AGE: [birth year]   Admit date: 12/07/2014 Date of Consult: 12/08/2014  Primary Physician: Monico Blitz, MD Primary Cardiologist: none  Reason for consult:  A-fib with RVR, new onset  Problem List  Past Medical History  Diagnosis Date  . Hypertension   . Heart disease   . Gait difficulty 09/02/2014  . Hypercholesterolemia   . COPD (chronic obstructive pulmonary disease) ([place])   . Type II diabetes mellitus ([place])   . Chronic bronchitis ([place])     "get it q yr" (12/07/2014)  . Stroke ([place]) 05/2014    denies residual on 12/07/2014  . Arthritis     "bad in my legs" (12/07/2014)  . Chronic back pain     "dr said my spine is crooked"    Past Surgical History  Procedure Laterality Date  . Joint replacement    . Tumor excision  ~ 1970    "9# in my stomach"  . Appendectomy  ~ 1970  . Total knee arthroplasty Right ~ 1986  . Abdominal hysterectomy  ~ 1970    Allergies  Allergies  Allergen Reactions  . Codeine Nausea And Vomiting   HPI   A 74 year old lady who presents with a 3 to 4-day history of palpitations, associated with dyspnea, PND, wheezing and cough. This appears to be worsening and she now presents to the emergency room. The patient states that she had one similar episode earlier this year. She ran out of all of her meds the last Friday and couldn't get the new refills. She is a former smoker, quit 2 years ago. She was hospitalized this year with CVA. Denies syncope or chest pain, but complains of progressively worsening SOB.   Evaluation in the emergency room found her to be in atrial fibrillation with rapid ventricular response. She has been started on a Cardizem drip. When I came to see her in the room, soon after, she went into a bradycardia in the region of 45/m. She was asymptomatic with this.   Inpatient Medications  . amLODipine  5 mg Oral Daily  . atorvastatin  40 mg Oral q1800    . carvedilol  12.5 mg Oral BID WC  . clopidogrel  75 mg Oral Daily  . DULoxetine  60 mg Oral Daily  . feeding supplement (ENSURE ENLIVE)  237 mL Oral BID BM  . furosemide  40 mg Oral Daily  . hydrALAZINE  25 mg Oral BID  . [START ON 12/09/2014] Influenza vac split quadrivalent PF  0.5 mL Intramuscular Tomorrow-1000  . insulin aspart  0-5 Units Subcutaneous QHS  . insulin aspart  0-9 Units Subcutaneous TID WC  . multivitamin with minerals  1 tablet Oral Daily  . off the beat book   Does not apply Once  . pregabalin  50 mg Oral Daily  . sodium chloride  3 mL Intravenous Q12H   Family History Family History  Problem Relation Age of Onset  . Cancer Mother   . Cancer Other     Social History Social History   Social History  . Marital Status: Widowed    Spouse Name: N/A  . Number of Children: 0  . Years of Education: 9   Occupational History  . retired    Social History Main Topics  . Smoking status: Former Smoker -- 1.00 packs/day for 50 years    Types: Cigarettes  . Smokeless tobacco: Never Used     Comment: "quit smoking cigarettes  in ~ 2005"  . Alcohol Use: Yes     Comment: 10/242016 "quit drinking beer in ~ 1977"  . Drug Use: No  . Sexual Activity: No   Other Topics Concern  . Not on file   Social History Narrative   Patient drinks caffeine a few times a week.   Patient is right handed.     Review of Systems  General:  No chills, fever, night sweats or weight changes.  Cardiovascular:  No chest pain, dyspnea on exertion, edema, orthopnea, palpitations, paroxysmal nocturnal dyspnea. Dermatological: No rash, lesions/masses Respiratory: No cough, dyspnea Urologic: No hematuria, dysuria Abdominal:   No nausea, vomiting, diarrhea, bright red blood per rectum, melena, or hematemesis Neurologic:  No visual changes, wkns, changes in mental status. All other systems reviewed and are otherwise negative except as noted above.  Physical Exam  Blood pressure  189/87, pulse 61, temperature 98.5 F (36.9 C), temperature source Oral, resp. rate 17, height 5\' 7"  (1.702 m), weight 195 lb (88.451 kg), SpO2 96 %.  General: Pleasant, NAD Psych: Normal affect. Neuro: Alert and oriented X 3. Moves all extremities spontaneously. HEENT: Normal  Neck: Supple without bruits or JVD. Lungs:  Resp regular and unlabored, wheezing B/L. Heart: RRR no s3, s4, or murmurs. Abdomen: Soft, non-tender, non-distended, BS + x 4.  Extremities: No clubbing, cyanosis, mild B/L edema. DP/PT/Radials 2+ and equal bilaterally.  Labs  Recent Labs  12/07/14 1650 12/07/14 2002  TROPONINI 0.09* 0.12*   Lab Results  Component Value Date   WBC 5.8 12/07/2014   HGB 7.9* 12/07/2014   HCT 24.1* 12/07/2014   MCV 94.1 12/07/2014   PLT 170 12/07/2014    Recent Labs Lab 12/07/14 1650  NA 140  K 4.6  CL 112*  CO2 18*  BUN 52*  CREATININE 1.97*  CALCIUM 8.7*  PROT 6.7  BILITOT 0.7  ALKPHOS 78  ALT 29  AST 33  GLUCOSE 109*   Lab Results  Component Value Date   CHOL 153 05/28/2014   HDL 45 05/28/2014   LDLCALC 84 05/28/2014   TRIG 121 05/28/2014   Radiology/Studies  Dg Chest 2 View  12/07/2014  CLINICAL DATA:  Shortness of breath, chest tightness, weakness x1 week EXAM: CHEST  2 VIEW COMPARISON:  08/24/2014 FINDINGS: Chronic interstitial markings. Mild left basilar scarring. No focal consolidation. No pleural effusion or pneumothorax. Cardiomegaly. Degenerative changes of the visualized thoracolumbar spine. IMPRESSION: No evidence of acute cardiopulmonary disease. Electronically Signed   By: Julian Hy M.D.   On: 12/07/2014 17:40   Echocardiogram - 05/29/2014 - Left ventricle: The cavity size was normal. Systolic function was normal. The estimated ejection fraction was in the range of 55% to 60%. Wall motion was normal; there were no regional wall motion abnormalities. Left ventricular diastolic function parameters were normal. - Aortic valve:  There was trivial regurgitation. - Mitral valve: There was mild to moderate regurgitation directed centrally. - Left atrium: The atrium was mildly dilated.  ECG: a-fib with RVR on 12/07/2014  Telemetry: SR, one episode of VT - total of 11 beats   ASSESSMENT AND PLAN  1. Atrial fibrillation with rapid ventricular response. This is the second episode, possible triggered by acute COPD exacerbation, her CHADs-VASc score is 7 and she should be on anticoagulation, NOAC would be ok. I would avoid carvedilol as she is actively wheezing and start long acting cardizem 240 mg CD po daily.  2. SOB - progressively worsening, she also had an episode of VT  on telemetry, she will need an ischemic workup, however not a candidate for cath with CKD stage 4, and active wheezing is a contraindication to lexiscan use, I would recommend to do lexiscan nuclear stress test once she is out of acute COPD excerbation.  3. Acute diastolic CHF - we will switch to iv lasix for the next 24 hours and reevaluate, BNP 1200  4. Acute COPD exacerbation - start oral steroids and breathing treatment  5. Hypertension. Start cardizem CD 240 mg po daily, uptitrate as needed   6. Anemia. This is chronic, however will need to monitored closely with starting of anticoagulation.     Signed, Dorothy Spark, MD, [facility] 12/08/2014, 9:49 AM

## 2014-12-08 NOTE — Progress Notes (Signed)
Patient arrived to unit by care link, A&O x 3, c/o back pain, denies cp, sob, able to ambulate with walker, uses cane at home.  Telemetry initiated Sinus Brady/Sinus Rhythm on telemetry, will continue to monitor patient.

## 2014-12-09 DIAGNOSIS — E1159 Type 2 diabetes mellitus with other circulatory complications: Secondary | ICD-10-CM

## 2014-12-09 DIAGNOSIS — I272 Other secondary pulmonary hypertension: Secondary | ICD-10-CM

## 2014-12-09 DIAGNOSIS — J440 Chronic obstructive pulmonary disease with acute lower respiratory infection: Secondary | ICD-10-CM

## 2014-12-09 DIAGNOSIS — I4891 Unspecified atrial fibrillation: Secondary | ICD-10-CM

## 2014-12-09 DIAGNOSIS — I10 Essential (primary) hypertension: Secondary | ICD-10-CM | POA: Diagnosis not present

## 2014-12-09 LAB — BASIC METABOLIC PANEL
Anion gap: 13 (ref 5–15)
BUN: 44 mg/dL — AB (ref 6–20)
CALCIUM: 9.2 mg/dL (ref 8.9–10.3)
CHLORIDE: 106 mmol/L (ref 101–111)
CO2: 20 mmol/L — ABNORMAL LOW (ref 22–32)
CREATININE: 1.9 mg/dL — AB (ref 0.44–1.00)
GFR calc Af Amer: 28 mL/min — ABNORMAL LOW (ref >60)
GFR, EST NON AFRICAN AMERICAN: 25 mL/min — AB (ref >60)
Glucose, Bld: 147 mg/dL — ABNORMAL HIGH (ref 65–99)
Potassium: 4.4 mmol/L (ref 3.5–5.1)
SODIUM: 139 mmol/L (ref 135–145)

## 2014-12-09 LAB — CBC
HCT: 22.9 % — ABNORMAL LOW (ref 36.0–46.0)
Hemoglobin: 7.5 g/dL — ABNORMAL LOW (ref 12.0–15.0)
MCH: 30.4 pg (ref 26.0–34.0)
MCHC: 32.8 g/dL (ref 30.0–36.0)
MCV: 92.7 fL (ref 78.0–100.0)
PLATELETS: 171 10*3/uL (ref 150–400)
RBC: 2.47 MIL/uL — ABNORMAL LOW (ref 3.87–5.11)
RDW: 15.3 % (ref 11.5–15.5)
WBC: 5.9 10*3/uL (ref 4.0–10.5)

## 2014-12-09 LAB — GLUCOSE, CAPILLARY
GLUCOSE-CAPILLARY: 126 mg/dL — AB (ref 65–99)
GLUCOSE-CAPILLARY: 173 mg/dL — AB (ref 65–99)
Glucose-Capillary: 186 mg/dL — ABNORMAL HIGH (ref 65–99)
Glucose-Capillary: 257 mg/dL — ABNORMAL HIGH (ref 65–99)

## 2014-12-09 MED ORDER — ISOSORBIDE MONONITRATE ER 30 MG PO TB24
30.0000 mg | ORAL_TABLET | Freq: Every day | ORAL | Status: DC
  Administered 2014-12-09 – 2014-12-11 (×3): 30 mg via ORAL
  Filled 2014-12-09 (×3): qty 1

## 2014-12-09 MED ORDER — FUROSEMIDE 10 MG/ML IJ SOLN
40.0000 mg | Freq: Once | INTRAMUSCULAR | Status: AC
  Administered 2014-12-09: 40 mg via INTRAVENOUS
  Filled 2014-12-09: qty 4

## 2014-12-09 MED ORDER — HYDRALAZINE HCL 20 MG/ML IJ SOLN
5.0000 mg | Freq: Once | INTRAMUSCULAR | Status: AC
  Administered 2014-12-09: 5 mg via INTRAVENOUS
  Filled 2014-12-09: qty 1

## 2014-12-09 MED ORDER — FUROSEMIDE 10 MG/ML IJ SOLN: 40.0000 mg | Freq: Two times a day (BID) | INTRAMUSCULAR | Status: DC

## 2014-12-09 NOTE — Research (Signed)
ReDS Vest at Discharge Informed Consent   Subject [name]  Subject met inclusion and exclusion criteria.  The informed consent form, study requirements and expectations were reviewed with the subject and questions and concerns were addressed prior to the signing of the consent form.  The subject verbalized understanding of the trail requirements.  The subject agreed to participate in the ReDS Vest at Discharge trial and signed the informed consent.  The informed consent was obtained prior to performance of any protocol-specific procedures for the subject.  A copy of the signed informed consent was given to the subject and a copy was placed in the subject's medical record.  [name] 12/09/2014, 3:19 PM

## 2014-12-09 NOTE — Evaluation (Signed)
Physical Therapy Evaluation Patient Details [name] MRN: [id number] DOB: 09/01/1940 Today's Date: 12/09/2014   History of Present Illness  This is a 74 year old lady who presents with a 3 to four-day history of palpitations, associated with dyspnea, PND, wheezing and cough. This appears to be worsening and she now presents to the emergency room. Evaluation in the emergency room found her to be in atrial fibrillation with rapid ventricular response.  Clinical Impression  Pt functioning near baseline with mild deconditioning from prolonged illness. Pt with good home set up and despite living alone she has family that calls and visits daily. Pt functioning at mod I/supervision level with RW. Acute PT to con't to follow to improve activity tolerance and overall strength.    Follow Up Recommendations No PT follow up;Supervision - Intermittent    Equipment Recommendations   (has RW)    Recommendations for Other Services       Precautions / Restrictions Precautions Precautions: Fall Restrictions Weight Bearing Restrictions: No      Mobility  Bed Mobility Overal bed mobility:  (pt received standing at bedside looking through suitcase)                Transfers Overall transfer level: Modified independent Equipment used: Rolling walker (2 wheeled)             General transfer comment: pt with safe use of hands, doesn't require RW to stand safely but does to amb  Ambulation/Gait Ambulation/Gait assistance: Supervision Ambulation Distance (Feet): 120 Feet Assistive device: Rolling walker (2 wheeled) Gait Pattern/deviations: Step-through pattern Gait velocity: decreased Gait velocity interpretation: at or above normal speed for age/gender General Gait Details: no episodes of LOB, SpO2 dec to 92% on RA with SOB however pt reports "this is normal, atleast i'm not wheezing like I was", v/c's for safe turning for line management  Stairs             Wheelchair Mobility    Modified Rankin (Stroke Patients Only)       Balance Overall balance assessment: Needs assistance         Standing balance support: No upper extremity supported Standing balance-Leahy Scale: Fair Standing balance comment: okay standing statically but requires RW for safe amb                             Pertinent Vitals/Pain Pain Assessment: No/denies pain    Home Living Family/patient expects to be discharged to:: Private residence Living Arrangements: Alone Available Help at Discharge: Available PRN/intermittently Type of Home: House Home Access: Ramped entrance     Home Layout: One level Home Equipment: Environmental consultant - 2 wheels;Shower seat      Prior Function Level of Independence: Independent with assistive device(s)         Comments: pt uses RW and/or cane, son take her to grocery store and niece calls/checks in daily (visits at least every other day).     Hand Dominance   Dominant Hand: Right    Extremity/Trunk Assessment   Upper Extremity Assessment: Overall WFL for tasks assessed (hand weakness/numbness (been present for 1 year))           Lower Extremity Assessment: Generalized weakness (bilat LE numbness, for about a year)      Cervical / Trunk Assessment: Normal  Communication   Communication: No difficulties  Cognition Arousal/Alertness: Awake/alert Behavior During Therapy: WFL for tasks assessed/performed Overall Cognitive Status: Within Functional Limits for tasks  assessed                      General Comments      Exercises        Assessment/Plan    PT Assessment Patient needs continued PT services  PT Diagnosis Generalized weakness   PT Problem List Decreased strength;Decreased range of motion;Decreased activity tolerance;Decreased balance;Decreased mobility  PT Treatment Interventions DME instruction;Gait training;Therapeutic activities;Therapeutic exercise;Functional mobility  training   PT Goals (Current goals can be found in the Care Plan section) Acute Rehab PT Goals Patient Stated Goal: home today PT Goal Formulation: With patient Time For Goal Achievement: 12/16/14 Potential to Achieve Goals: Good Additional Goals Additional Goal #1: Pt to score >19 on DGI to indicate minimal falls risk.    Frequency Min 2X/week   Barriers to discharge Decreased caregiver support lives alone    Co-evaluation               End of Session Equipment Utilized During Treatment: Gait belt Activity Tolerance: Patient tolerated treatment well Patient left: with call bell/phone within reach (sitting EOB eating breakfast) Nurse Communication: Mobility status    Functional Assessment Tool Used: clinical jdugement Functional Limitation: Mobility: Walking and moving around Mobility: Walking and Moving Around Current Status [id number]): At least 20 percent but less than 40 percent impaired, limited or restricted Mobility: Walking and Moving Around Goal Status [phone]): At least 1 percent but less than 20 percent impaired, limited or restricted    Time: 0738-0802 PT Time Calculation (min) (ACUTE ONLY): 24 min   Charges:   PT Evaluation $Initial PT Evaluation Tier I: 1 Procedure PT Treatments $Gait Training: 8-22 mins   PT G Codes:   PT G-Codes **NOT FOR INPATIENT CLASS** Functional Assessment Tool Used: clinical jdugement Functional Limitation: Mobility: Walking and moving around Mobility: Walking and Moving Around Current Status [id number]): At least 20 percent but less than 40 percent impaired, limited or restricted Mobility: Walking and Moving Around Goal Status [phone]): At least 1 percent but less than 20 percent impaired, limited or restricted    Kingsley Callander 12/09/2014, 8:25 AM  Kittie Plater, PT, DPT Pager #: [phone] Office #: [phone]

## 2014-12-09 NOTE — Progress Notes (Addendum)
Patient [name] Date of Encounter: 12/09/2014  Active Problems:   CVA (cerebral infarction)   Essential hypertension   Type 2 diabetes mellitus with other circulatory complications (HCC)   Atrial fibrillation with rapid ventricular response (HCC)   Atrial fibrillation with RVR ([place])   Length of Stay: 2  SUBJECTIVE  The patient still feels SOB, no CP.  CURRENT MEDS . amLODipine  2.5 mg Oral Daily  . apixaban  2.5 mg Oral BID  . atorvastatin  40 mg Oral q1800  . clopidogrel  75 mg Oral Daily  . diltiazem  240 mg Oral Daily  . DULoxetine  60 mg Oral Daily  . feeding supplement (ENSURE ENLIVE)  237 mL Oral BID BM  . hydrALAZINE  25 mg Oral BID  . Influenza vac split quadrivalent PF  0.5 mL Intramuscular Tomorrow-1000  . insulin aspart  0-5 Units Subcutaneous QHS  . insulin aspart  0-9 Units Subcutaneous TID WC  . levofloxacin  500 mg Oral Q48H  . multivitamin with minerals  1 tablet Oral Daily  . predniSONE  50 mg Oral Q breakfast  . pregabalin  50 mg Oral Daily  . sodium chloride  3 mL Intravenous Q12H   OBJECTIVE  Filed Vitals:   12/09/14 0045 12/09/14 0328 12/09/14 0330 12/09/14 0443  BP: 145/82  189/80 177/76  Pulse: 63     Temp: 97.8 F (36.6 C)  100 F (37.8 C)   TempSrc: Oral  Oral   Resp: 13   17  Height:      Weight:  192 lb 14.4 oz (87.499 kg)    SpO2: 100%  97%     Intake/Output Summary (Last 24 hours) at 12/09/14 1132 Last data filed at 12/09/14 0845  Gross per 24 hour  Intake    840 ml  Output   1150 ml  Net   -310 ml   Filed Weights   12/07/14 1639 12/07/14 2337 12/09/14 0328  Weight: 189 lb (85.73 kg) 195 lb (88.451 kg) 192 lb 14.4 oz (87.499 kg)   PHYSICAL EXAM  General: Pleasant, NAD. Neuro: Alert and oriented X 3. Moves all extremities spontaneously. Psych: Normal affect. HEENT:  Normal  Neck: Supple without bruits or JVD. Lungs:  Resp regular and unlabored, wheezing B/L. Heart: RRR no s3, s4, or murmurs. Abdomen:  Soft, non-tender, non-distended, BS + x 4.  Extremities: No clubbing, cyanosis or edema. DP/PT/Radials 2+ and equal bilaterally.  Accessory Clinical Findings  CBC  Recent Labs  12/07/14 1650 12/09/14 0339  WBC 5.8 5.9  NEUTROABS 3.9  --   HGB 7.9* 7.5*  HCT 24.1* 22.9*  MCV 94.1 92.7  PLT 170 [id number]   Basic Metabolic Panel  Recent Labs  12/07/14 1650 12/09/14 0339  NA 140 139  K 4.6 4.4  CL 112* 106  CO2 18* 20*  GLUCOSE 109* 147*  BUN 52* 44*  CREATININE 1.97* 1.90*  CALCIUM 8.7* 9.2   Liver Function Tests  Recent Labs  12/07/14 1650  AST 33  ALT 29  ALKPHOS 78  BILITOT 0.7  PROT 6.7  ALBUMIN 3.5    Recent Labs  12/07/14 1650 12/07/14 2002  TROPONINI 0.09* 0.12*    Recent Labs  12/07/14 1650  TSH 1.459    Radiology/Studies  Dg Chest 2 View  12/07/2014  CLINICAL DATA:  Shortness of breath, chest tightness, weakness x1 week EXAM: CHEST  2 VIEW COMPARISON:  08/24/2014 FINDINGS: Chronic interstitial markings. Mild left basilar scarring. No  focal consolidation. No pleural effusion or pneumothorax. Cardiomegaly. Degenerative changes of the visualized thoracolumbar spine. IMPRESSION: No evidence of acute cardiopulmonary disease. Electronically Signed   By: Julian Hy M.D.   On: 12/07/2014 17:40    TELE: SR, 60-80 BPM    ASSESSMENT AND PLAN  1. Atrial fibrillation with rapid ventricular response. Now cardioverted to SR. This is the second episode, possible triggered by acute COPD exacerbation, her CHADs-VASc score is 7 and she should be on anticoagulation, NOAC would be ok. She is borderline for Eliquis 2.5, however we will choose the lower dose with her anemia. I would avoid carvedilol as she is actively wheezing and start long acting cardizem 240 mg CD po daily.  2. SOB - progressively worsening, she also had an episode of VT on telemetry, she will need an ischemic workup, however not a candidate for cath with CKD stage 4, and active wheezing  is a contraindication to lexiscan use, I would recommend to do lexiscan nuclear stress test once she is out of acute COPD excerbation.  3. Acute diastolic CHF - we will switch to iv lasix for the next 24 hours and reevaluate, BNP 1200  4. Acute COPD exacerbation - start oral steroids and breathing treatment  5. Hypertension. Start cardizem CD 240 mg po daily, HR in 60', unable to use BB (wheezing), ACEI/ARB  With CKD stage 4, will ad imdur 30 mg po daily  6. Pulmonary hypertension - severe - with severe COPD, add imdur, continue CCB  7. Anemia. This is chronic, however will need to monitored closely with starting of anticoagulation.    Signed, Dorothy Spark MD, [facility] 12/09/2014

## 2014-12-09 NOTE — Progress Notes (Signed)
PROGRESS NOTE  Jody [name] [id number] DOB: 23-Jun-1940 DOA: 12/07/2014 PCP: Monico Blitz, MD   HPI: 74 year old lady who presents with a 3 to four-day history of palpitations, associated with dyspnea, PND, wheezing and cough. This appears to be worsening and she now presents to the emergency room. Evaluation in the emergency room found her to be in atrial fibrillation with rapid ventricular response. She has been started on a Cardizem drip. She initially presented on 10/24 to [facility] and because of no SDU beds she was transferred to [facility].   Subjective / 24 H Interval events - no new complaints.  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  - likely related to #1 - CHADs-VASc score is 7, started on anticoagulation, initially on heparin / Coumadin now transitioned to Eliquis per cardiology  - currently on Cardizem, rate controlled  Hypertension - continue Cardizem, hydralazine, as well and Norvasc. Up titrate if needed  Diabetes. Continue with home medications and sliding scale insulin.  Anemia. This is chronic. There is no acute bleeding clinically.  Chronic diastolic heart failure - Lasix per cardiology   Diet: Diet Heart Room service appropriate?: Yes; Fluid consistency:: Thin Fluids: none  DVT Prophylaxis: Eliquis  Code Status: Full Code Family Communication: no family bedside  Disposition Plan: home when ready    Consultants:  Cardiology   Procedures:  None    Antibiotics Levofloxacin 10/25 >>   Studies  No results found.  Objective  Filed  Vitals:   12/09/14 0328 12/09/14 0330 12/09/14 0443 12/09/14 1420  BP:  189/80 177/76 158/79  Pulse:    65  Temp:  100 F (37.8 C)  98.5 F (36.9 C)  TempSrc:  Oral  Oral  Resp:   17 18  Height:      Weight: 87.499 kg (192 lb 14.4 oz)     SpO2:  97%  100%    Intake/Output Summary (Last 24 hours) at 12/09/14 1726 Last data filed at 12/09/14 1653  Gross per 24 hour  Intake    840 ml  Output   2150 ml  Net  -1310 ml   Filed Weights   12/07/14 1639 12/07/14 2337 12/09/14 0328  Weight: 85.73 kg (189 lb) 88.451 kg (195 lb) 87.499 kg (192 lb 14.4 oz)    Exam:  GENERAL: NAD, in nad  HEENT: head NCAT, no scleral icterus.   NECK: Supple. No LAD  LUNGS: Moves air well. + wheezing, no crackles  HEART: Regular rate and rhythm without murmur. 2+ pulses, no JVD, no peripheral edema  ABDOMEN: Soft, non-distended, non-tender. Positive bowel sounds.  EXTREMITIES: Without any cyanosis or clubbing. Good muscle tone  NEUROLOGIC: Alert and oriented x3. Cranial nerves II through XII are grossly intact. Strength 5/5 in all 4.   Data Reviewed: Basic Metabolic Panel:  Recent Labs Lab 12/07/14 1650 12/09/14 0339  NA 140 139  K 4.6 4.4  CL 112* 106  CO2 18* 20*  GLUCOSE 109* 147*  BUN 52* 44*  CREATININE 1.97* 1.90*  CALCIUM 8.7* 9.2   Liver Function Tests:  Recent Labs Lab 12/07/14 1650  AST 33  ALT 29  ALKPHOS  78  BILITOT 0.7  PROT 6.7  ALBUMIN 3.5   CBC:  Recent Labs Lab 12/07/14 1650 12/09/14 0339  WBC 5.8 5.9  NEUTROABS 3.9  --   HGB 7.9* 7.5*  HCT 24.1* 22.9*  MCV 94.1 92.7  PLT 170 171   Cardiac Enzymes:  Recent Labs Lab 12/07/14 1650 12/07/14 2002  TROPONINI 0.09* 0.12*   BNP (last 3 results)  Recent Labs  12/07/14 1650  BNP 1242.0*    Recent Results (from the past 240 hour(s))  MRSA PCR Screening     Status: None   Collection Time: 12/08/14 12:19 AM  Result Value Ref Range Status   MRSA by PCR NEGATIVE NEGATIVE Final    Comment:         The GeneXpert MRSA Assay (FDA approved for NASAL specimens only), is one component of a comprehensive MRSA colonization surveillance program. It is not intended to diagnose MRSA infection nor to guide or monitor treatment for MRSA infections.      Scheduled Meds: . amLODipine  2.5 mg Oral Daily  . apixaban  2.5 mg Oral BID  . atorvastatin  40 mg Oral q1800  . clopidogrel  75 mg Oral Daily  . diltiazem  240 mg Oral Daily  . DULoxetine  60 mg Oral Daily  . feeding supplement (ENSURE ENLIVE)  237 mL Oral BID BM  . furosemide  40 mg Intravenous Once  . [START ON 12/10/2014] furosemide  40 mg Intravenous BID  . hydrALAZINE  25 mg Oral BID  . Influenza vac split quadrivalent PF  0.5 mL Intramuscular Tomorrow-1000  . insulin aspart  0-5 Units Subcutaneous QHS  . insulin aspart  0-9 Units Subcutaneous TID WC  . isosorbide mononitrate  30 mg Oral Daily  . levofloxacin  500 mg Oral Q48H  . multivitamin with minerals  1 tablet Oral Daily  . predniSONE  50 mg Oral Q breakfast  . pregabalin  50 mg Oral Daily  . sodium chloride  3 mL Intravenous Q12H   Continuous Infusions:    Velvet Bathe, MD [facility] Pager [phone]. If 7 PM - 7 AM, please contact night-coverage at [email or web address], password [facility] 12/09/2014, 5:26 PM  LOS: 2 days

## 2014-12-10 ENCOUNTER — Observation Stay (HOSPITAL_COMMUNITY): Payer: Medicare HMO

## 2014-12-10 ENCOUNTER — Observation Stay (HOSPITAL_BASED_OUTPATIENT_CLINIC_OR_DEPARTMENT_OTHER): Payer: Medicare HMO

## 2014-12-10 DIAGNOSIS — R0609 Other forms of dyspnea: Secondary | ICD-10-CM

## 2014-12-10 DIAGNOSIS — I10 Essential (primary) hypertension: Secondary | ICD-10-CM | POA: Diagnosis not present

## 2014-12-10 DIAGNOSIS — I472 Ventricular tachycardia: Secondary | ICD-10-CM | POA: Diagnosis not present

## 2014-12-10 DIAGNOSIS — E1159 Type 2 diabetes mellitus with other circulatory complications: Secondary | ICD-10-CM | POA: Diagnosis not present

## 2014-12-10 DIAGNOSIS — I4891 Unspecified atrial fibrillation: Secondary | ICD-10-CM | POA: Diagnosis not present

## 2014-12-10 LAB — NM MYOCAR MULTI W/SPECT W/WALL MOTION / EF
CHL CUP MPHR: 144 {beats}/min
CHL CUP NUCLEAR SDS: 1
CHL CUP RESTING HR STRESS: 56 {beats}/min
CHL CUP STRESS STAGE 1 HR: 53 {beats}/min
CHL CUP STRESS STAGE 1 SBP: 169 mmHg
CHL CUP STRESS STAGE 1 SPEED: 0 mph
CHL CUP STRESS STAGE 2 GRADE: 0 %
CHL CUP STRESS STAGE 2 SPEED: 0 mph
CHL CUP STRESS STAGE 3 DBP: 68 mmHg
CHL CUP STRESS STAGE 3 GRADE: 0 %
CHL CUP STRESS STAGE 3 HR: 71 {beats}/min
CHL CUP STRESS STAGE 4 DBP: 78 mmHg
CHL CUP STRESS STAGE 4 SBP: 160 mmHg
CHL CUP STRESS STAGE 5 DBP: 76 mmHg
CHL RATE OF PERCEIVED EXERTION: 0
CSEPED: 0 min
CSEPEDS: 0 s
CSEPEW: 1 METS
CSEPHR: 56 %
CSEPPBP: 163 mmHg
CSEPPHR: 63 {beats}/min
CSEPPMHR: 43 %
LHR: 0.04
LVDIAVOL: 151 mL
LVSYSVOL: 72 mL
NUC STRESS TID: 1
SRS: 5
SSS: 6
Stage 1 DBP: 87 mmHg
Stage 1 Grade: 0 %
Stage 2 HR: 53 {beats}/min
Stage 3 SBP: 149 mmHg
Stage 3 Speed: 0 mph
Stage 4 Grade: 0 %
Stage 4 HR: 64 {beats}/min
Stage 4 Speed: 0 mph
Stage 5 Grade: 0 %
Stage 5 HR: 63 {beats}/min
Stage 5 SBP: 163 mmHg
Stage 5 Speed: 0 mph

## 2014-12-10 LAB — CBC
HEMATOCRIT: 23 % — AB (ref 36.0–46.0)
HEMOGLOBIN: 7.4 g/dL — AB (ref 12.0–15.0)
MCH: 30.1 pg (ref 26.0–34.0)
MCHC: 32.2 g/dL (ref 30.0–36.0)
MCV: 93.5 fL (ref 78.0–100.0)
Platelets: 169 10*3/uL (ref 150–400)
RBC: 2.46 MIL/uL — ABNORMAL LOW (ref 3.87–5.11)
RDW: 15.7 % — AB (ref 11.5–15.5)
WBC: 6.9 10*3/uL (ref 4.0–10.5)

## 2014-12-10 LAB — GLUCOSE, CAPILLARY
GLUCOSE-CAPILLARY: 118 mg/dL — AB (ref 65–99)
Glucose-Capillary: 214 mg/dL — ABNORMAL HIGH (ref 65–99)
Glucose-Capillary: 183 mg/dL — ABNORMAL HIGH (ref 65–99)
Glucose-Capillary: 222 mg/dL — ABNORMAL HIGH (ref 65–99)

## 2014-12-10 LAB — BASIC METABOLIC PANEL
ANION GAP: 10 (ref 5–15)
BUN: 46 mg/dL — ABNORMAL HIGH (ref 6–20)
CALCIUM: 9.3 mg/dL (ref 8.9–10.3)
CO2: 24 mmol/L (ref 22–32)
Chloride: 104 mmol/L (ref 101–111)
Creatinine, Ser: 2.13 mg/dL — ABNORMAL HIGH (ref 0.44–1.00)
GFR calc non Af Amer: 21 mL/min — ABNORMAL LOW (ref >60)
GFR, EST AFRICAN AMERICAN: 25 mL/min — AB (ref >60)
Glucose, Bld: 124 mg/dL — ABNORMAL HIGH (ref 65–99)
Potassium: 3.9 mmol/L (ref 3.5–5.1)
Sodium: 138 mmol/L (ref 135–145)

## 2014-12-10 MED ORDER — TECHNETIUM TC 99M SESTAMIBI GENERIC - CARDIOLITE
30.0000 | Freq: Once | INTRAVENOUS | Status: AC | PRN
  Administered 2014-12-10: 30 via INTRAVENOUS

## 2014-12-10 MED ORDER — REGADENOSON 0.4 MG/5ML IV SOLN
INTRAVENOUS | Status: AC
  Administered 2014-12-10: 0.4 mg via INTRAVENOUS
  Filled 2014-12-10: qty 5

## 2014-12-10 MED ORDER — REGADENOSON 0.4 MG/5ML IV SOLN
0.4000 mg | Freq: Once | INTRAVENOUS | Status: AC
  Administered 2014-12-10: 0.4 mg via INTRAVENOUS
  Filled 2014-12-10: qty 5

## 2014-12-10 MED ORDER — FUROSEMIDE 40 MG PO TABS
40.0000 mg | ORAL_TABLET | Freq: Every day | ORAL | Status: DC
  Administered 2014-12-10 – 2014-12-11 (×2): 40 mg via ORAL
  Filled 2014-12-10 (×2): qty 1

## 2014-12-10 MED ORDER — TECHNETIUM TC 99M SESTAMIBI GENERIC - CARDIOLITE
10.0000 | Freq: Once | INTRAVENOUS | Status: AC | PRN
  Administered 2014-12-10: 10 via INTRAVENOUS

## 2014-12-10 NOTE — Progress Notes (Signed)
PROGRESS NOTE  Jody [name] [id number] DOB: 13-Feb-1941 DOA: 12/07/2014 PCP: Monico Blitz, MD   HPI: 74 year old lady who presents with a 3 to four-day history of palpitations, associated with dyspnea, PND, wheezing and cough. This appears to be worsening and she now presents to the emergency room. Evaluation in the emergency room found her to be in atrial fibrillation with rapid ventricular response. She has been started on a Cardizem drip. She initially presented on 10/24 to [facility] and because of no SDU beds she was transferred to [facility].   Subjective / 24 H Interval events - no new complaints.  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  - likely related to #1 - CHADs-VASc score is 7, started on anticoagulation, initially on heparin / Coumadin now transitioned to Eliquis per cardiology  - Cardiology managing, rate controlled on po cardizem  Hypertension - continue Cardizem, hydralazine, as well and Norvasc. Up titrate if needed  Diabetes. Continue with home medications and sliding scale insulin.  Anemia. This is chronic. There is no acute bleeding clinically.  Chronic diastolic heart failure - Lasix per cardiology   Diet: Diet Heart Room service appropriate?: Yes; Fluid consistency:: Thin Fluids: none  DVT Prophylaxis: Eliquis  Code Status: Full Code Family Communication: no family bedside  Disposition Plan: home when ready    Consultants:  Cardiology   Procedures:  None    Antibiotics Levofloxacin 10/25 >>   Studies  No results  found.  Objective  Filed Vitals:   12/10/14 1024 12/10/14 1026 12/10/14 1028 12/10/14 1440  BP: 163/79 160/78 163/76 118/62  Pulse:    70  Temp:    98.8 F (37.1 C)  TempSrc:    Oral  Resp:    18  Height:      Weight:      SpO2:    99%    Intake/Output Summary (Last 24 hours) at 12/10/14 1556 Last data filed at 12/10/14 0815  Gross per 24 hour  Intake    300 ml  Output   2600 ml  Net  -2300 ml   Filed Weights   12/07/14 2337 12/09/14 0328 12/10/14 0504  Weight: 88.451 kg (195 lb) 87.499 kg (192 lb 14.4 oz) 87.227 kg (192 lb 4.8 oz)    Exam:  GENERAL: NAD, in nad  HEENT: head NCAT, no scleral icterus.   NECK: Supple. No LAD  LUNGS: Moves air well. + wheezing, no crackles  HEART: Regular rate and rhythm without murmur. 2+ pulses, no JVD, no peripheral edema  ABDOMEN: Soft, non-distended, non-tender. Positive bowel sounds.  EXTREMITIES: Without any cyanosis or clubbing. Good muscle tone  NEUROLOGIC: Alert and oriented x3. Cranial nerves II through XII are grossly intact. Strength 5/5 in all 4.   Data Reviewed: Basic Metabolic Panel:  Recent Labs Lab 12/07/14 1650 12/09/14 0339 12/10/14 0820  NA 140 139 138  K 4.6 4.4 3.9  CL 112* 106 104  CO2 18* 20* 24  GLUCOSE 109* 147* 124*  BUN 52* 44* 46*  CREATININE 1.97* 1.90* 2.13*  CALCIUM 8.7* 9.2 9.3   Liver Function Tests:  Recent Labs Lab 12/07/14 1650  AST  33  ALT 29  ALKPHOS 78  BILITOT 0.7  PROT 6.7  ALBUMIN 3.5   CBC:  Recent Labs Lab 12/07/14 1650 12/09/14 0339 12/10/14 0326  WBC 5.8 5.9 6.9  NEUTROABS 3.9  --   --   HGB 7.9* 7.5* 7.4*  HCT 24.1* 22.9* 23.0*  MCV 94.1 92.7 93.5  PLT 170 171 169   Cardiac Enzymes:  Recent Labs Lab 12/07/14 1650 12/07/14 2002  TROPONINI 0.09* 0.12*   BNP (last 3 results)  Recent Labs  12/07/14 1650  BNP 1242.0*    Recent Results (from the past 240 hour(s))  MRSA PCR Screening     Status: None   Collection Time: 12/08/14 12:19 AM   Result Value Ref Range Status   MRSA by PCR NEGATIVE NEGATIVE Final    Comment:        The GeneXpert MRSA Assay (FDA approved for NASAL specimens only), is one component of a comprehensive MRSA colonization surveillance program. It is not intended to diagnose MRSA infection nor to guide or monitor treatment for MRSA infections.      Scheduled Meds: . amLODipine  2.5 mg Oral Daily  . apixaban  2.5 mg Oral BID  . atorvastatin  40 mg Oral q1800  . clopidogrel  75 mg Oral Daily  . diltiazem  240 mg Oral Daily  . DULoxetine  60 mg Oral Daily  . feeding supplement (ENSURE ENLIVE)  237 mL Oral BID BM  . furosemide  40 mg Oral Daily  . hydrALAZINE  25 mg Oral BID  . Influenza vac split quadrivalent PF  0.5 mL Intramuscular Tomorrow-1000  . insulin aspart  0-5 Units Subcutaneous QHS  . insulin aspart  0-9 Units Subcutaneous TID WC  . isosorbide mononitrate  30 mg Oral Daily  . levofloxacin  500 mg Oral Q48H  . multivitamin with minerals  1 tablet Oral Daily  . predniSONE  50 mg Oral Q breakfast  . pregabalin  50 mg Oral Daily  . sodium chloride  3 mL Intravenous Q12H   Continuous Infusions:    Velvet Bathe, MD [facility] Pager [phone]. If 7 PM - 7 AM, please contact night-coverage at [email or web address], password [facility] 12/10/2014, 3:56 PM  LOS: 3 days

## 2014-12-10 NOTE — Progress Notes (Signed)
ReDS Vest Discharge Study  Results of ReDS reading  Your patient is in the Blinded arm of the Vest at Discharge study.  Your patient has had a ReDS Vest reading and the reading has been transmitted to the cloud.  Your patient is ok for discharge.    Thank You   The research team    

## 2014-12-10 NOTE — Progress Notes (Signed)
Patient Name: Felisita Pantazis Date of Encounter: 12/10/2014  Primary Cardiologist: new   Active Problems:   CVA (cerebral infarction)   Essential hypertension   Type 2 diabetes mellitus with other circulatory complications (HCC)   Atrial fibrillation with rapid ventricular response (HCC)   Atrial fibrillation with RVR ([place])    SUBJECTIVE  No longer wheezing. No significant SOB.   CURRENT MEDS . amLODipine  2.5 mg Oral Daily  . apixaban  2.5 mg Oral BID  . atorvastatin  40 mg Oral q1800  . clopidogrel  75 mg Oral Daily  . diltiazem  240 mg Oral Daily  . DULoxetine  60 mg Oral Daily  . feeding supplement (ENSURE ENLIVE)  237 mL Oral BID BM  . furosemide  40 mg Intravenous BID  . hydrALAZINE  25 mg Oral BID  . Influenza vac split quadrivalent PF  0.5 mL Intramuscular Tomorrow-1000  . insulin aspart  0-5 Units Subcutaneous QHS  . insulin aspart  0-9 Units Subcutaneous TID WC  . isosorbide mononitrate  30 mg Oral Daily  . levofloxacin  500 mg Oral Q48H  . multivitamin with minerals  1 tablet Oral Daily  . predniSONE  50 mg Oral Q breakfast  . pregabalin  50 mg Oral Daily  . sodium chloride  3 mL Intravenous Q12H    OBJECTIVE  Filed Vitals:   12/09/14 1420 12/09/14 1938 12/09/14 2106 12/10/14 0504  BP: 158/79 158/88 147/64 153/74  Pulse: 65  64 58  Temp: 98.5 F (36.9 C)  99.2 F (37.3 C) 99.9 F (37.7 C)  TempSrc: Oral  Oral Oral  Resp: 18     Height:      Weight:    192 lb 4.8 oz (87.227 kg)  SpO2: 100%  100% 100%    Intake/Output Summary (Last 24 hours) at 12/10/14 0757 Last data filed at 12/10/14 0609  Gross per 24 hour  Intake    780 ml  Output   3250 ml  Net  -2470 ml   Filed Weights   12/07/14 2337 12/09/14 0328 12/10/14 0504  Weight: 195 lb (88.451 kg) 192 lb 14.4 oz (87.499 kg) 192 lb 4.8 oz (87.227 kg)    PHYSICAL EXAM  General: Pleasant, NAD. Neuro: Alert and oriented X 3. Moves all extremities spontaneously. Psych: Normal  affect. HEENT:  Normal  Neck: Supple without bruits or JVD. Lungs:  Resp regular and unlabored, CTA. Heart: RRR no s3, s4, or murmurs. Abdomen: Soft, non-tender, non-distended, BS + x 4.  Extremities: No clubbing, cyanosis or edema. DP/PT/Radials 2+ and equal bilaterally.  Accessory Clinical Findings  CBC  Recent Labs  12/07/14 1650 12/09/14 0339 12/10/14 0326  WBC 5.8 5.9 6.9  NEUTROABS 3.9  --   --   HGB 7.9* 7.5* 7.4*  HCT 24.1* 22.9* 23.0*  MCV 94.1 92.7 93.5  PLT 170 171 [id number]   Basic Metabolic Panel  Recent Labs  12/07/14 1650 12/09/14 0339  NA 140 139  K 4.6 4.4  CL 112* 106  CO2 18* 20*  GLUCOSE 109* 147*  BUN 52* 44*  CREATININE 1.97* 1.90*  CALCIUM 8.7* 9.2   Liver Function Tests  Recent Labs  12/07/14 1650  AST 33  ALT 29  ALKPHOS 78  BILITOT 0.7  PROT 6.7  ALBUMIN 3.5   Cardiac Enzymes  Recent Labs  12/07/14 1650 12/07/14 2002  TROPONINI 0.09* 0.12*   Thyroid Function Tests  Recent Labs  12/07/14 1650  TSH 1.459  TELE NSR without recurrent afib    ECG  No new EKG  Echocardiogram 12/08/2014  LV EF: 55% -  60%  ------------------------------------------------------------------- Indications:   Atrial fibrillation - 427.31.  ------------------------------------------------------------------- History:  PMH:  Stroke. Risk factors: Hypertension. Diabetes mellitus.  ------------------------------------------------------------------- Study Conclusions  - Left ventricle: The cavity size was normal. Wall thickness was normal. Systolic function was normal. The estimated ejection fraction was in the range of 55% to 60%. Wall motion was normal; there were no regional wall motion abnormalities. - Left atrium: The atrium was moderately dilated. - Pulmonary arteries: Systolic pressure was moderately increased. PA peak pressure: 58 mm Hg (S).     Radiology/Studies  Dg Chest 2 View  12/07/2014  CLINICAL  DATA:  Shortness of breath, chest tightness, weakness x1 week EXAM: CHEST  2 VIEW COMPARISON:  08/24/2014 FINDINGS: Chronic interstitial markings. Mild left basilar scarring. No focal consolidation. No pleural effusion or pneumothorax. Cardiomegaly. Degenerative changes of the visualized thoracolumbar spine. IMPRESSION: No evidence of acute cardiopulmonary disease. Electronically Signed   By: Julian Hy M.D.   On: 12/07/2014 17:40     ASSESSMENT AND PLAN  1. Acute COPD exacerbation  2. Atrial fibrillation with RVR  - CHADs-VASc score is 7   - second episode, possible triggered by acute COPD exacerbation  - started on lower dose of eliquis 2.5mg  BID given her anemia  - avoid coreg with active wheezing, start long acting diltiazem 240mg  CD PO daily  3. Episodic VT: not candidate for cath given CKD stage 4  - planning for lexiscan myoview once improve from COPD perspective  - no longer wheezing, obtain lexiscan myoview, if normal, likely can be discharged later today.  4. CKD stage IV  5. Acute diastolic HF  - Echo 123XX123 EF 55-60%, PA peak pressure 97mmhg  - euvolemic on exam, back to home dose PO lasix.   6. Severe anemia: hgb 7.5, chronic  7. HTN  8. H/o CVA: on plavix, now on eliquis as well, higher risk of bleeding, given onset of afib, ?if it is better to be off plavix and continue eliquis  Signed, Woodward Ku Pager: [phone]   The patient was seen, examined and discussed with Almyra Deforest, PA-C and I agree with the above.   74 year old female with PAF, now in SR with frequent PACs, also nsVT during this admission and DOE. Wheezing has resolved, we will plan for a Lexiscan nuclear stress test today.  She was started on Eliquis 2.5 mg po BID, and is anemic, Hb will need to be followed closely.  She is still hypertensive, i would increase imdur to 60 mg po daily.  If negative stress test, can be discharged home later today, we will arrange for an outpatient follow  up.   Dorothy Spark 12/10/2014

## 2014-12-10 NOTE — Progress Notes (Signed)
No ischemia on stress test.  Dr. Meda Coffee has reviewed results and pt ok for discharge.

## 2014-12-10 NOTE — Progress Notes (Signed)
lexscan myoview completed without complications,  nuc results to follow.

## 2014-12-11 DIAGNOSIS — I4891 Unspecified atrial fibrillation: Secondary | ICD-10-CM | POA: Diagnosis not present

## 2014-12-11 DIAGNOSIS — E1159 Type 2 diabetes mellitus with other circulatory complications: Secondary | ICD-10-CM | POA: Diagnosis not present

## 2014-12-11 LAB — GLUCOSE, CAPILLARY
GLUCOSE-CAPILLARY: 170 mg/dL — AB (ref 65–99)
Glucose-Capillary: 131 mg/dL — ABNORMAL HIGH (ref 65–99)

## 2014-12-11 LAB — CBC
HEMATOCRIT: 23.4 % — AB (ref 36.0–46.0)
HEMOGLOBIN: 7.5 g/dL — AB (ref 12.0–15.0)
MCH: 29.5 pg (ref 26.0–34.0)
MCHC: 32.1 g/dL (ref 30.0–36.0)
MCV: 92.1 fL (ref 78.0–100.0)
Platelets: 188 10*3/uL (ref 150–400)
RBC: 2.54 MIL/uL — AB (ref 3.87–5.11)
RDW: 15.5 % (ref 11.5–15.5)
WBC: 7.2 10*3/uL (ref 4.0–10.5)

## 2014-12-11 MED ORDER — APIXABAN 5 MG PO TABS: 5.0000 mg | ORAL_TABLET | Freq: Two times a day (BID) | ORAL | Status: DC

## 2014-12-11 MED ORDER — DILTIAZEM HCL ER COATED BEADS 240 MG PO CP24: 240.0000 mg | ORAL_CAPSULE | Freq: Every day | ORAL | Status: DC

## 2014-12-11 MED ORDER — GLIPIZIDE 5 MG PO TABS: 2.5000 mg | ORAL_TABLET | Freq: Every day | ORAL | Status: DC

## 2014-12-11 MED ORDER — ISOSORBIDE MONONITRATE ER 30 MG PO TB24: 30.0000 mg | ORAL_TABLET | Freq: Every day | ORAL | Status: DC

## 2014-12-11 MED ORDER — AMLODIPINE BESYLATE 2.5 MG PO TABS: 2.5000 mg | ORAL_TABLET | Freq: Every day | ORAL | Status: DC

## 2014-12-11 MED ORDER — APIXABAN 2.5 MG PO TABS: 2.5000 mg | ORAL_TABLET | Freq: Two times a day (BID) | ORAL | Status: DC

## 2014-12-11 MED ORDER — LEVOFLOXACIN 500 MG PO TABS: 500.0000 mg | ORAL_TABLET | ORAL | Status: DC

## 2014-12-11 NOTE — Discharge Summary (Signed)
Physician Discharge Summary  Jody [name] [id number] DOB: 1940-12-20 DOA: 12/07/2014  PCP: Monico Blitz, MD  Admit date: 12/07/2014 Discharge date: 12/11/2014  Time spent: > 35 minutes  Recommendations for Outpatient Follow-up:  1. Please monitor S creatinine levels 2. Patient will be transitioned from Plavix to eliquis, Will d/c Plavix on d/c and administer lower dose Eliquis for 2 days then transition to full dose Eliquis  3. Will discontinue metformin given contraindication secondary to elevated serum creatinine as such will transition to glipizide  Discharge Diagnoses:  Active Problems:   CVA (cerebral infarction)   Essential hypertension   Type 2 diabetes mellitus with other circulatory complications (HCC)   Atrial fibrillation with rapid ventricular response (HCC)   Atrial fibrillation with RVR [name]   Discharge Condition:  stable  Diet recommendation: Heart healthy  Filed Weights   12/09/14 0328 12/10/14 0504 12/11/14 0505  Weight: 87.499 kg (192 lb 14.4 oz) 87.227 kg (192 lb 4.8 oz) 87.136 kg (192 lb 1.6 oz)    History of present illness:  From original HPI: 74 year old lady who presents with a 3 to four-day history of palpitations, associated with dyspnea, PND, wheezing and cough. This appears to be worsening and she now presents to the emergency room. Evaluation in the emergency room found her to be in atrial fibrillation with rapid ventricular response. She has been started on a Cardizem drip. When I came to see her in the room, soon after, she went into a bradycardia in the region of 45/m. She is asymptomatic with this. She is now being admitted for further investigation and management.  Hospital Course:  COPD exacerbation with wheezing, cough - Resolved patient may go back on home medication regimen. Will continue Levaquin for one more day starting 12/12/2014  Atrial fibrillation with rapid ventricular response.  - likely related to #1 - CHADs-VASc score  is 7, started on anticoagulation, initially on heparin / Coumadin now transitioned to Eliquis per cardiology  - Cardiology recommending Cardizem - Beta blocker discontinued  Hypertension - continue Cardizem, hydralazine, Imdur and Norvasc on discharge.  Diabetes - unable to go on Metformin given Serum creatinine. Will provide prescription for glipizide on discharge  Anemia. This is chronic. There is no acute bleeding clinically.  Chronic diastolic heart failure - Patient will continue home medication regimen as well as medications listed under hypertension  History CVA - Discontinue Plavix on discharge. This was discussed with neurologist in house and patient will be transitioned to eliquis  Procedures:  Echocardiogram reporting: EF 60-55% no regional wall motion abnormality  Consultations:  Cardiology  Discharge Exam: Filed Vitals:   12/11/14 0505  BP: 158/70  Pulse: 57  Temp: 99.2 F (37.3 C)  Resp:     General: Pt in nad, alert and awake Cardiovascular: rrr, no mrg Respiratory: cta bl, no wheezes  Discharge Instructions   Discharge Instructions    Call MD for:  difficulty breathing, headache or visual disturbances    Complete by:  As directed      Call MD for:  redness, tenderness, or signs of infection (pain, swelling, redness, odor or green/yellow discharge around incision site)    Complete by:  As directed      Call MD for:  temperature >100.4    Complete by:  As directed      Diet - low sodium heart healthy    Complete by:  As directed      Discharge instructions    Complete by:  As directed   Discontinue  taking your Plavix today 12/11/2014. He will continue to take lower dose Apixaban until 12/12/2014 then on 12/13/14 you are to start full dose Apixaban 5 mg po bid.  Please follow up with your pcp within the next 1 week.     Increase activity slowly    Complete by:  As directed           Current Discharge Medication List    START taking these  medications   Details  !! apixaban (ELIQUIS) 2.5 MG TABS tablet Take 1 tablet (2.5 mg total) by mouth 2 (two) times daily. Qty: 4 tablet, Refills: 0    !! apixaban (ELIQUIS) 5 MG TABS tablet Take 1 tablet (5 mg total) by mouth 2 (two) times daily. Qty: 60 tablet, Refills: 0    diltiazem (CARDIZEM CD) 240 MG 24 hr capsule Take 1 capsule (240 mg total) by mouth daily. Qty: 30 capsule, Refills: 0    glipiZIDE (GLUCOTROL) 5 MG tablet Take 0.5 tablets (2.5 mg total) by mouth daily before breakfast. Qty: 30 tablet, Refills: 0    isosorbide mononitrate (IMDUR) 30 MG 24 hr tablet Take 1 tablet (30 mg total) by mouth daily. Qty: 30 tablet, Refills: 0    levofloxacin (LEVAQUIN) 500 MG tablet Take 1 tablet (500 mg total) by mouth every other day. Qty: 1 tablet, Refills: 0     !! - Potential duplicate medications found. Please discuss with provider.    CONTINUE these medications which have CHANGED   Details  amLODipine (NORVASC) 2.5 MG tablet Take 1 tablet (2.5 mg total) by mouth daily. Qty: 30 tablet, Refills: 0      CONTINUE these medications which have NOT CHANGED   Details  albuterol (PROVENTIL HFA;VENTOLIN HFA) 108 (90 BASE) MCG/ACT inhaler Inhale 1-2 puffs into the lungs every 6 (six) hours as needed for wheezing or shortness of breath.    atorvastatin (LIPITOR) 40 MG tablet Take 1 tablet (40 mg total) by mouth daily at 6 PM. Qty: 90 tablet, Refills: 0    DULoxetine (CYMBALTA) 60 MG capsule Take 60 mg by mouth daily.    furosemide (LASIX) 40 MG tablet Take 40 mg by mouth daily.     hydrALAZINE (APRESOLINE) 25 MG tablet Take 1 tablet by mouth 2 (two) times daily.    Multiple Vitamin (MULTIVITAMIN WITH MINERALS) TABS tablet Take 1 tablet by mouth daily.    pregabalin (LYRICA) 50 MG capsule Take 50 mg by mouth daily.    SYMBICORT 160-4.5 MCG/ACT inhaler Inhale 2 puffs into the lungs daily as needed (for shortness of breath).       STOP taking these medications     carvedilol  (COREG) 12.5 MG tablet      clopidogrel (PLAVIX) 75 MG tablet      metFORMIN (GLUCOPHAGE) 1000 MG tablet      naproxen sodium (ANAPROX) 220 MG tablet        Allergies  Allergen Reactions  . Codeine Nausea And Vomiting   Follow-up Information    Follow up with CARROLL,DONNA, NP.   Specialties:  Nurse Practitioner, Cardiology   Why:  follow up for a fib clinic, the office will call with date and time.     Contact information:   [place] [address] [phone]       Follow up with Dorothy Spark, MD On 02/10/2015.   Specialty:  Cardiology   Why:  2:30pm   Contact information:   [place]  [place] [address] [phone]        The results of significant diagnostics from this hospitalization (including imaging, microbiology, ancillary and laboratory) are listed below for reference.    Significant Diagnostic Studies: Dg Chest 2 View  12/07/2014  CLINICAL DATA:  Shortness of breath, chest tightness, weakness x1 week EXAM: CHEST  2 VIEW COMPARISON:  08/24/2014 FINDINGS: Chronic interstitial markings. Mild left basilar scarring. No focal consolidation. No pleural effusion or pneumothorax. Cardiomegaly. Degenerative changes of the visualized thoracolumbar spine. IMPRESSION: No evidence of acute cardiopulmonary disease. Electronically Signed   By: Julian Hy M.D.   On: 12/07/2014 17:40   Nm Myocar Multi W/spect W/wall Motion / Ef  12/10/2014   T wave inversion was noted during stress in the V4, V5 and V6 leads, beginning at 1 minutes of stress, ending at 7 minutes of stress, and returning to baseline after 5-9 mins of recovery.  ST segment depression was noted during stress in the V4, V5, V6 and II leads, beginning at 1 minutes of stress, ending at 7 minutes of stress.  Defect 1: There is a medium defect of moderate severity present in the basal inferoseptal, basal inferior and mid inferior location.  The left ventricular ejection  fraction is mildly decreased (45-54%).  This is a low risk study.  Significant diaphragmatic attenuation and extracardiac uptake, but cannot rule out ischemia in the basal to mid inferior and inferoseptal walls. Wall motion is normal, so favor artifact.     Microbiology: Recent Results (from the past 240 hour(s))  MRSA PCR Screening     Status: None   Collection Time: 12/08/14 12:19 AM  Result Value Ref Range Status   MRSA by PCR NEGATIVE NEGATIVE Final    Comment:        The GeneXpert MRSA Assay (FDA approved for NASAL specimens only), is one component of a comprehensive MRSA colonization surveillance program. It is not intended to diagnose MRSA infection nor to guide or monitor treatment for MRSA infections.      Labs: Basic Metabolic Panel:  Recent Labs Lab 12/07/14 1650 12/09/14 0339 12/10/14 0820  NA 140 139 138  K 4.6 4.4 3.9  CL 112* 106 104  CO2 18* 20* 24  GLUCOSE 109* 147* 124*  BUN 52* 44* 46*  CREATININE 1.97* 1.90* 2.13*  CALCIUM 8.7* 9.2 9.3   Liver Function Tests:  Recent Labs Lab 12/07/14 1650  AST 33  ALT 29  ALKPHOS 78  BILITOT 0.7  PROT 6.7  ALBUMIN 3.5   No results for input(s): LIPASE, AMYLASE in the last 168 hours. No results for input(s): AMMONIA in the last 168 hours. CBC:  Recent Labs Lab 12/07/14 1650 12/09/14 0339 12/10/14 0326 12/11/14 0324  WBC 5.8 5.9 6.9 7.2  NEUTROABS 3.9  --   --   --   HGB 7.9* 7.5* 7.4* 7.5*  HCT 24.1* 22.9* 23.0* 23.4*  MCV 94.1 92.7 93.5 92.1  PLT 170 171 169 188   Cardiac Enzymes:  Recent Labs Lab 12/07/14 1650 12/07/14 2002  TROPONINI 0.09* 0.12*   BNP: BNP (last 3 results)  Recent Labs  12/07/14 1650  BNP 1242.0*    ProBNP (last 3 results) No results for input(s): PROBNP in the last 8760 hours.  CBG:  Recent Labs Lab 12/10/14 0735 12/10/14 1123 12/10/14 1630 12/10/14 2116 12/11/14 0732  GLUCAP 118* 214* 183* 222* 131*    Signed:  Velvet Bathe  [facility] 12/11/2014, 10:44 AM

## 2014-12-11 NOTE — Progress Notes (Signed)
Yesterday's myoview result noted, no ischemia. Maintaining NSR. Expect discharge soon. Need to followup renal function closely, if renal function continue to deteriorate, may need to switch anticoagulation therapy. Neurology to decide as outpatient whether to continue plavix given h/o stroke along with low dose eliquis. Outpatient followup arranged.   Please call with any questions. Cardiology signing off.  Hilbert Corrigan PA Pager: [phone]

## 2014-12-24 ENCOUNTER — Telehealth (HOSPITAL_COMMUNITY): Payer: Self-pay | Admitting: *Deleted

## 2014-12-24 NOTE — Telephone Encounter (Signed)
Have attempted multiple occasions to schedule appointment with patient - she is dependent on a niece for transportation but have been unable to "nail down date". Patient would prefer to follow up with primary physician at [facility] if possible as this is easier to get transportation too.  Confirmed with office she has appointment 12/25/14 at 345. Will fax notes from recent hospitalization so that appropriate follow up is done as far as labs etc.  Told patient if PCP deems necessary to still follow up with cardiology before appointment with Dr. Meda Coffee in late December we will be happy to see her or possibly set up appointment with NP/PA at [facility] location if this is easier transportation wise.  Patient was agreeable to this. Notes faxed to [facility].

## 2015-01-14 ENCOUNTER — Encounter: Payer: Self-pay | Admitting: Cardiovascular Disease

## 2015-01-14 ENCOUNTER — Ambulatory Visit (INDEPENDENT_AMBULATORY_CARE_PROVIDER_SITE_OTHER): Payer: Medicare HMO | Admitting: Cardiovascular Disease

## 2015-01-14 VITALS — BP 135/76 | HR 71 | Ht 66.0 in | Wt 193.0 lb

## 2015-01-14 DIAGNOSIS — I472 Ventricular tachycardia: Secondary | ICD-10-CM | POA: Diagnosis not present

## 2015-01-14 DIAGNOSIS — Z87898 Personal history of other specified conditions: Secondary | ICD-10-CM | POA: Diagnosis not present

## 2015-01-14 DIAGNOSIS — Z9289 Personal history of other medical treatment: Secondary | ICD-10-CM

## 2015-01-14 DIAGNOSIS — R5383 Other fatigue: Secondary | ICD-10-CM

## 2015-01-14 DIAGNOSIS — I4729 Other ventricular tachycardia: Secondary | ICD-10-CM

## 2015-01-14 DIAGNOSIS — I4891 Unspecified atrial fibrillation: Secondary | ICD-10-CM | POA: Diagnosis not present

## 2015-01-14 DIAGNOSIS — I5032 Chronic diastolic (congestive) heart failure: Secondary | ICD-10-CM

## 2015-01-14 DIAGNOSIS — I10 Essential (primary) hypertension: Secondary | ICD-10-CM

## 2015-01-14 DIAGNOSIS — R931 Abnormal findings on diagnostic imaging of heart and coronary circulation: Secondary | ICD-10-CM

## 2015-01-14 DIAGNOSIS — R519 Headache, unspecified: Secondary | ICD-10-CM

## 2015-01-14 DIAGNOSIS — R51 Headache: Secondary | ICD-10-CM

## 2015-01-14 MED ORDER — APIXABAN 2.5 MG PO TABS: 2.5000 mg | ORAL_TABLET | Freq: Two times a day (BID) | ORAL | Status: DC

## 2015-01-14 MED ORDER — ATORVASTATIN CALCIUM 20 MG PO TABS: 20.0000 mg | ORAL_TABLET | Freq: Every day | ORAL | Status: DC

## 2015-01-14 MED ORDER — DILTIAZEM HCL ER COATED BEADS 120 MG PO CP24: 120.0000 mg | ORAL_CAPSULE | Freq: Every day | ORAL | Status: DC

## 2015-01-14 NOTE — Patient Instructions (Addendum)
   Stop Imdur (Isosorbide).  Remain off of the Coreg (Carvedilol) & the Plavix (Clopidogrel) - removed from medication list today.  Continue the Eliquis at 2.5mg  twice a day  (5mg  removed from list today).  New sent to pharmacy today also.   Decrease your Cardizem CD to 120mg  daily - new sent to [facility] today.  Decrease your Lipitor to 20mg  daily - new sent to [facility] today.  (may break the 40mg  tablet in half till finish current supply) Continue all other medications.   Follow up in  6-8 weeks.

## 2015-01-14 NOTE — Progress Notes (Signed)
Patient ID: Jody [name], female   DOB: 1940-06-15, 74 y.o.   MRN: [id number]      SUBJECTIVE: The patient presents for post hospitalization follow-up. She was hospitalized in October 2016 for an acute COPD exacerbation and rapid atrial fibrillation. She also has hypertension, diabetes, chronic anemia, nonsustained ventricular tachycardia, chronic kidney disease stage IV, CVA, and chronic diastolic heart failure.  Echocardiogram on 12/08/14 showed normal left ventricular systolic function, EF 0000000, and normal regional wall motion, moderate left atrial dilatation, and moderately elevated pulmonary pressures, 58 mmHg.  She was not deemed to be a suitable candidate for coronary angiography due to chronic kidney disease stage IV. She underwent nuclear stress testing on 12/10/14 which showed a medium-sized defect of moderate severity in the inferior wall and the basal inferoseptal wall. There was significant diaphragmatic attenuation and extra cardiac uptake, but ischemia could not entirely be ruled out. Regional wall motion was normal so the interpretation favored artifact.  Denies chest pain and says breathing has considerably improved. Does have bad headaches. Niece, Jody [name], who checks in on her daily, says she can't believe how weak her aunt is. She is normally much more active. Patient says meds make her fatigued.  No falls since leaving hospital bur 4 falls earlier this year as per Jody [name].  Review of Systems: As per "subjective", otherwise negative.  Allergies  Allergen Reactions  . Codeine Nausea And Vomiting    Current Outpatient Prescriptions  Medication Sig Dispense Refill  . albuterol (PROVENTIL HFA;VENTOLIN HFA) 108 (90 BASE) MCG/ACT inhaler Inhale 1-2 puffs into the lungs every 6 (six) hours as needed for wheezing or shortness of breath.    [name] apixaban (ELIQUIS) 5 MG TABS tablet Take 1 tablet (5 mg total) by mouth 2 (two) times daily. 60 tablet 0  . atorvastatin (LIPITOR) 40 MG  tablet Take 1 tablet (40 mg total) by mouth daily at 6 PM. 90 tablet 0  . carvedilol (COREG) 12.5 MG tablet Take 1 tablet by mouth 2 (two) times daily.    . clopidogrel (PLAVIX) 75 MG tablet Take 1 tablet by mouth daily.    [name] diltiazem (CARDIZEM CD) 240 MG 24 hr capsule Take 1 capsule (240 mg total) by mouth daily. 30 capsule 0  . furosemide (LASIX) 40 MG tablet Take 40 mg by mouth daily.     [name] glipiZIDE (GLUCOTROL) 5 MG tablet Take 0.5 tablets (2.5 mg total) by mouth daily before breakfast. 30 tablet 0  . isosorbide mononitrate (IMDUR) 30 MG 24 hr tablet Take 1 tablet (30 mg total) by mouth daily. 30 tablet 0  . metFORMIN (GLUCOPHAGE) 1000 MG tablet Take 1 tablet by mouth 2 (two) times daily.    . Multiple Vitamin (MULTIVITAMIN WITH MINERALS) TABS tablet Take 1 tablet by mouth daily.    . pregabalin (LYRICA) 50 MG capsule Take 50 mg by mouth daily.    . pregabalin (LYRICA) 50 MG capsule Take 50 mg by mouth daily.    . SYMBICORT 160-4.5 MCG/ACT inhaler Inhale 2 puffs into the lungs daily as needed (for shortness of breath).     [name] apixaban (ELIQUIS) 2.5 MG TABS tablet Take 1 tablet (2.5 mg total) by mouth 2 (two) times daily. 4 tablet 0   No current facility-administered medications for this visit.    Past Medical History  Diagnosis Date  . Hypertension   . Heart disease   . Gait difficulty 09/02/2014  . Hypercholesterolemia   . COPD (chronic obstructive pulmonary disease) ([place])   .  Type II diabetes mellitus ([place])   . Chronic bronchitis ([place])     "get it q yr" (12/07/2014)  . Stroke ([place]) 05/2014    denies residual on 12/07/2014  . Arthritis     "bad in my legs" (12/07/2014)  . Chronic back pain     "dr said my spine is crooked"    Past Surgical History  Procedure Laterality Date  . Joint replacement    . Tumor excision  ~ 1970    "9# in my stomach"  . Appendectomy  ~ 1970  . Total knee arthroplasty Right ~ 1986  . Abdominal hysterectomy  ~ 1970    Social History   Social  History  . Marital Status: Widowed    Spouse Name: N/A  . Number of Children: 0  . Years of Education: 9   Occupational History  . retired    Social History Main Topics  . Smoking status: Former Smoker -- 1.00 packs/day for 50 years    Types: Cigarettes    Start date: 03/07/1953    Quit date: 01/13/2013  . Smokeless tobacco: Never Used     Comment: "quit smoking cigarettes  in ~ 2005"  . Alcohol Use: 0.0 oz/week    0 Standard drinks or equivalent per week     Comment: 10/242016 "quit drinking beer in ~ 1977"  . Drug Use: No  . Sexual Activity: No   Other Topics Concern  . Not on file   Social History Narrative   Patient drinks caffeine a few times a week.   Patient is right handed.     Filed Vitals:   01/14/15 1046  BP: 135/76  Pulse: 71  Height: 5\' 6"  (1.676 m)  Weight: 193 lb (87.544 kg)    PHYSICAL EXAM General: NAD HEENT: Normal. Neck: No JVD, no thyromegaly. Lungs: No rales or wheezes. CV: Nondisplaced PMI.  Regular rate and rhythm, normal S1/S2, no S3/S4, no murmur. No pretibial or periankle edema.   Abdomen: Soft, nontender, no distention.  Neurologic: Alert and oriented x 3.  Psych: Normal affect. Skin: Normal. Musculoskeletal: No gross deformities. Extremities: No clubbing or cyanosis.   ECG: Most recent ECG reviewed.      ASSESSMENT AND PLAN: 1. Atrial fibrillation: On long-acting diltiazem and low dose Eliquis (due to anemia). Due to fatigue, will reduce Cardizem CD to 120 mg daily.  2. Chronic diastolic heart failure: Euvolemic on Lasix 40 mg. No changes.  3. Essential HTN: Controlled. No changes.  4. Non-sustained ventricular tachycardia: No Coreg or other beta blockers due to COPD. Continue diltiazem but at lower dose. No ischemia on stress testing.  5. Abnormal nuclear stress test: Probable artifact based on normal regional wall motion. Due to severe headaches, will stop Imdur.  6. Fatigue and headaches: Will stop Imdur and reduce  Cardizem CD to 120 mg daily and Liptor to 20 mg daily.  Dispo: f/u 6-8 weeks.  Time spent: 40 minutes, of which greater than 50% was spent reviewing symptoms, relevant blood tests and studies, and discussing management plan with the patient.   Kate Sable, M.D., F.A.C.C.

## 2015-01-15 ENCOUNTER — Other Ambulatory Visit: Payer: Self-pay | Admitting: *Deleted

## 2015-01-15 MED ORDER — APIXABAN 2.5 MG PO TABS: 2.5000 mg | ORAL_TABLET | Freq: Two times a day (BID) | ORAL | Status: DC

## 2015-02-01 ENCOUNTER — Telehealth: Payer: Self-pay | Admitting: Cardiovascular Disease

## 2015-02-01 DIAGNOSIS — R609 Edema, unspecified: Secondary | ICD-10-CM

## 2015-02-01 DIAGNOSIS — I10 Essential (primary) hypertension: Secondary | ICD-10-CM

## 2015-02-01 DIAGNOSIS — G451 Carotid artery syndrome (hemispheric): Secondary | ICD-10-CM

## 2015-02-01 NOTE — Telephone Encounter (Signed)
Since stopping 5 of her meds, legs & feet have been swelling.  No weight gain.  No c/o chest pain, dizziness.  SOB not as bad as when she was in the hospital.  Goes to [place] on 02/10/15 to see Dr. Ena Dawley at [name] office.

## 2015-02-01 NOTE — Telephone Encounter (Signed)
Patient called about feet swelling to the point where it is very painful to walk on them.  Also wanted to say that she is having to go to the bathroom more than normal.

## 2015-02-02 NOTE — Telephone Encounter (Signed)
Increase Lasix to 40 mg bid x 3 days, then back to 40 mg daily. BMET on Friday.

## 2015-02-02 NOTE — Telephone Encounter (Signed)
Patient notified.  She does not think she is on Lasix.  Tried to look through the bottles while on phone, but could not see good enough to make them out.  Her daughter will be there today after 5 so she will have her go through all medications.  She will call back tomorrow morning.

## 2015-02-03 NOTE — Telephone Encounter (Signed)
Returned call to patient.  Stated that she did find the medication & her daughter has taken to pharmacy to get filled.  She will do the twice a day till Friday, 02/05/15.  She will be able to do her lab (BMET) on Wednesday, 02/10/15 as this is when she will have transportation.  Will fax order to [facility] now for patient.

## 2015-02-10 ENCOUNTER — Ambulatory Visit: Payer: Medicare HMO | Admitting: Cardiology

## 2015-02-18 ENCOUNTER — Encounter: Payer: Self-pay | Admitting: *Deleted

## 2015-03-01 ENCOUNTER — Ambulatory Visit: Payer: Medicare HMO | Admitting: Cardiovascular Disease

## 2015-03-01 ENCOUNTER — Ambulatory Visit (INDEPENDENT_AMBULATORY_CARE_PROVIDER_SITE_OTHER): Payer: Medicare HMO | Admitting: Cardiovascular Disease

## 2015-03-01 ENCOUNTER — Encounter: Payer: Self-pay | Admitting: Cardiovascular Disease

## 2015-03-01 VITALS — BP 170/87 | HR 66 | Ht 66.0 in | Wt 188.0 lb

## 2015-03-01 DIAGNOSIS — I4891 Unspecified atrial fibrillation: Secondary | ICD-10-CM | POA: Diagnosis not present

## 2015-03-01 DIAGNOSIS — I5032 Chronic diastolic (congestive) heart failure: Secondary | ICD-10-CM | POA: Diagnosis not present

## 2015-03-01 DIAGNOSIS — I472 Ventricular tachycardia: Secondary | ICD-10-CM

## 2015-03-01 DIAGNOSIS — I10 Essential (primary) hypertension: Secondary | ICD-10-CM | POA: Diagnosis not present

## 2015-03-01 DIAGNOSIS — I4729 Other ventricular tachycardia: Secondary | ICD-10-CM

## 2015-03-01 MED ORDER — AMLODIPINE BESYLATE 5 MG PO TABS: 5.0000 mg | ORAL_TABLET | Freq: Every day | ORAL | Status: DC

## 2015-03-01 NOTE — Progress Notes (Signed)
Patient ID: Jody [name], female   DOB: 06-19-1940, 75 y.o.   MRN: [id number]      SUBJECTIVE: The patient presents for follow-up of atrial fibrillation and chronic diastolic heart failure. She has been checking her blood pressure at home with systolic readings ranging between 167-170. Denies leg swelling and shortness of breath.   Review of Systems: As per "subjective", otherwise negative.  Allergies  Allergen Reactions  . Codeine Nausea And Vomiting    Current Outpatient Prescriptions  Medication Sig Dispense Refill  . albuterol (PROVENTIL HFA;VENTOLIN HFA) 108 (90 BASE) MCG/ACT inhaler Inhale 1-2 puffs into the lungs every 6 (six) hours as needed for wheezing or shortness of breath. Reported on 03/01/2015    . apixaban (ELIQUIS) 2.5 MG TABS tablet Take 2.5 mg by mouth 2 (two) times daily.    [name] atorvastatin (LIPITOR) 20 MG tablet Take 1 tablet (20 mg total) by mouth daily. 30 tablet 6  . diltiazem (CARDIZEM CD) 120 MG 24 hr capsule Take 1 capsule (120 mg total) by mouth daily. 30 capsule 6  . furosemide (LASIX) 40 MG tablet Take 40 mg by mouth daily.     [name] glipiZIDE (GLUCOTROL) 5 MG tablet Take 0.5 tablets (2.5 mg total) by mouth daily before breakfast. 30 tablet 0  . metFORMIN (GLUCOPHAGE) 1000 MG tablet Take 1 tablet by mouth 2 (two) times daily.    . Multiple Vitamin (MULTIVITAMIN WITH MINERALS) TABS tablet Take 1 tablet by mouth daily.    . pregabalin (LYRICA) 50 MG capsule Take 50 mg by mouth daily.    . SYMBICORT 160-4.5 MCG/ACT inhaler Inhale 2 puffs into the lungs daily as needed (for shortness of breath).      No current facility-administered medications for this visit.    Past Medical History  Diagnosis Date  . Hypertension   . Heart disease   . Gait difficulty 09/02/2014  . Hypercholesterolemia   . COPD (chronic obstructive pulmonary disease) ([place])   . Type II diabetes mellitus ([place])   . Chronic bronchitis ([place])     "get it q yr" (12/07/2014)  . Stroke ([place])  05/2014    denies residual on 12/07/2014  . Arthritis     "bad in my legs" (12/07/2014)  . Chronic back pain     "dr said my spine is crooked"    Past Surgical History  Procedure Laterality Date  . Joint replacement    . Tumor excision  ~ 1970    "9# in my stomach"  . Appendectomy  ~ 1970  . Total knee arthroplasty Right ~ 1986  . Abdominal hysterectomy  ~ 1970    Social History   Social History  . Marital Status: Widowed    Spouse Name: N/A  . Number of Children: 0  . Years of Education: 9   Occupational History  . retired    Social History Main Topics  . Smoking status: Former Smoker -- 1.00 packs/day for 50 years    Types: Cigarettes    Start date: 03/07/1953    Quit date: 01/13/2013  . Smokeless tobacco: Never Used     Comment: "quit smoking cigarettes  in ~ 2005"  . Alcohol Use: 0.0 oz/week    0 Standard drinks or equivalent per week     Comment: 10/242016 "quit drinking beer in ~ 1977"  . Drug Use: No  . Sexual Activity: No   Other Topics Concern  . Not on file   Social History Narrative   Patient drinks caffeine  a few times a week.   Patient is right handed.     Filed Vitals:   03/01/15 1541  BP: 170/87  Pulse: 66  Height: 5\' 6"  (1.676 m)  Weight: 188 lb (85.276 kg)  SpO2: 99%    PHYSICAL EXAM General: NAD HEENT: Normal. Neck: No JVD, no thyromegaly. Lungs: Clear to auscultation bilaterally with normal respiratory effort. CV: Nondisplaced PMI.  Regular rate and rhythm, normal S1/S2, no [id number], soft 1/6 systolic murmur along left sternal border. No pretibial or periankle edema.    Abdomen: Soft, nontender, no distention.  Neurologic: Alert and oriented x 3.  Psych: Normal affect. Skin: Normal. Musculoskeletal: No gross deformities. Extremities: No clubbing or cyanosis.   ECG: Most recent ECG reviewed.      ASSESSMENT AND PLAN: 1. Atrial fibrillation: On long-acting diltiazem and low dose Eliquis (due to anemia). No changes.  2.  Chronic diastolic heart failure: Euvolemic on Lasix 40 mg. Needs more optimal BP control. Will start amlodipine 5 mg.  3. Essential HTN: Markedly elevated. Start amlodipine 5 mg.  4. Non-sustained ventricular tachycardia: No Coreg or other beta blockers due to COPD. Continue diltiazem. No ischemia on stress testing.  5. Abnormal nuclear stress test: Probable artifact based on normal regional wall motion. Will monitor.  Dispo: f/u 6 months.   Kate Sable, M.D., F.A.C.C.

## 2015-03-01 NOTE — Patient Instructions (Addendum)
   Begin Norvasc 5mg  daily - new sent to [facility] today. Continue all other medications.   Your physician wants you to follow up in: 6 months.  You will receive a reminder letter in the mail one-two months in advance.  If you don't receive a letter, please call our office to schedule the follow up appointment

## 2015-03-09 ENCOUNTER — Telehealth: Payer: Self-pay | Admitting: *Deleted

## 2015-03-09 NOTE — Telephone Encounter (Signed)
Patient was called for 36-month follow-up for REDS@[email or web address]  Study. Patient is doing well with no hospitalizations in last 3 months. I thanked patient for her participation in the study.

## 2015-06-05 ENCOUNTER — Inpatient Hospital Stay (HOSPITAL_COMMUNITY)
Admission: EM | Admit: 2015-06-05 | Discharge: 2015-06-07 | DRG: 291 | Disposition: A | Discharge status: Home Health | Payer: Medicare HMO | Attending: Internal Medicine | Admitting: Internal Medicine

## 2015-06-05 ENCOUNTER — Encounter (HOSPITAL_COMMUNITY): Payer: Self-pay | Admitting: Emergency Medicine

## 2015-06-05 ENCOUNTER — Emergency Department (HOSPITAL_COMMUNITY): Payer: Medicare HMO

## 2015-06-05 DIAGNOSIS — J449 Chronic obstructive pulmonary disease, unspecified: Secondary | ICD-10-CM | POA: Insufficient documentation

## 2015-06-05 DIAGNOSIS — E1122 Type 2 diabetes mellitus with diabetic chronic kidney disease: Secondary | ICD-10-CM

## 2015-06-05 DIAGNOSIS — I130 Hypertensive heart and chronic kidney disease with heart failure and stage 1 through stage 4 chronic kidney disease, or unspecified chronic kidney disease: Secondary | ICD-10-CM | POA: Diagnosis not present

## 2015-06-05 DIAGNOSIS — D631 Anemia in chronic kidney disease: Secondary | ICD-10-CM | POA: Diagnosis not present

## 2015-06-05 DIAGNOSIS — Z96651 Presence of right artificial knee joint: Secondary | ICD-10-CM | POA: Diagnosis present

## 2015-06-05 DIAGNOSIS — Z794 Long term (current) use of insulin: Secondary | ICD-10-CM

## 2015-06-05 DIAGNOSIS — E1159 Type 2 diabetes mellitus with other circulatory complications: Secondary | ICD-10-CM | POA: Diagnosis present

## 2015-06-05 DIAGNOSIS — Z7902 Long term (current) use of antithrombotics/antiplatelets: Secondary | ICD-10-CM

## 2015-06-05 DIAGNOSIS — J441 Chronic obstructive pulmonary disease with (acute) exacerbation: Secondary | ICD-10-CM | POA: Diagnosis not present

## 2015-06-05 DIAGNOSIS — I482 Chronic atrial fibrillation, unspecified: Secondary | ICD-10-CM | POA: Diagnosis present

## 2015-06-05 DIAGNOSIS — I4891 Unspecified atrial fibrillation: Secondary | ICD-10-CM | POA: Diagnosis not present

## 2015-06-05 DIAGNOSIS — I5031 Acute diastolic (congestive) heart failure: Secondary | ICD-10-CM | POA: Insufficient documentation

## 2015-06-05 DIAGNOSIS — N184 Chronic kidney disease, stage 4 (severe): Secondary | ICD-10-CM

## 2015-06-05 DIAGNOSIS — Z87891 Personal history of nicotine dependence: Secondary | ICD-10-CM

## 2015-06-05 DIAGNOSIS — E7800 Pure hypercholesterolemia, unspecified: Secondary | ICD-10-CM | POA: Diagnosis present

## 2015-06-05 DIAGNOSIS — Z7951 Long term (current) use of inhaled steroids: Secondary | ICD-10-CM

## 2015-06-05 DIAGNOSIS — N186 End stage renal disease: Secondary | ICD-10-CM

## 2015-06-05 DIAGNOSIS — I5033 Acute on chronic diastolic (congestive) heart failure: Secondary | ICD-10-CM | POA: Diagnosis not present

## 2015-06-05 DIAGNOSIS — Z8673 Personal history of transient ischemic attack (TIA), and cerebral infarction without residual deficits: Secondary | ICD-10-CM

## 2015-06-05 DIAGNOSIS — E785 Hyperlipidemia, unspecified: Secondary | ICD-10-CM | POA: Diagnosis present

## 2015-06-05 DIAGNOSIS — R0602 Shortness of breath: Secondary | ICD-10-CM | POA: Diagnosis not present

## 2015-06-05 DIAGNOSIS — I480 Paroxysmal atrial fibrillation: Secondary | ICD-10-CM

## 2015-06-05 DIAGNOSIS — I10 Essential (primary) hypertension: Secondary | ICD-10-CM

## 2015-06-05 DIAGNOSIS — R0902 Hypoxemia: Secondary | ICD-10-CM | POA: Diagnosis present

## 2015-06-05 DIAGNOSIS — I509 Heart failure, unspecified: Secondary | ICD-10-CM

## 2015-06-05 HISTORY — DX: Dependence on renal dialysis: N18.6

## 2015-06-05 HISTORY — DX: Type 2 diabetes mellitus with diabetic chronic kidney disease: E11.22

## 2015-06-05 HISTORY — DX: Anemia in chronic kidney disease: D63.1

## 2015-06-05 HISTORY — DX: Paroxysmal atrial fibrillation: I48.0

## 2015-06-05 HISTORY — DX: Chronic kidney disease, stage 4 (severe): N18.4

## 2015-06-05 HISTORY — DX: Chronic atrial fibrillation, unspecified: I48.20

## 2015-06-05 LAB — CBC WITH DIFFERENTIAL/PLATELET
BASOS ABS: 0 10*3/uL (ref 0.0–0.1)
Basophils Relative: 0 %
EOS ABS: 0.2 10*3/uL (ref 0.0–0.7)
EOS PCT: 2 %
HCT: 22.2 % — ABNORMAL LOW (ref 36.0–46.0)
HEMOGLOBIN: 7.2 g/dL — AB (ref 12.0–15.0)
LYMPHS ABS: 0.6 10*3/uL — AB (ref 0.7–4.0)
LYMPHS PCT: 7 %
MCH: 29.8 pg (ref 26.0–34.0)
MCHC: 32.4 g/dL (ref 30.0–36.0)
MCV: 91.7 fL (ref 78.0–100.0)
Monocytes Absolute: 0.2 10*3/uL (ref 0.1–1.0)
Monocytes Relative: 2 %
NEUTROS PCT: 89 %
Neutro Abs: 8 10*3/uL — ABNORMAL HIGH (ref 1.7–7.7)
PLATELETS: 179 10*3/uL (ref 150–400)
RBC: 2.42 MIL/uL — AB (ref 3.87–5.11)
RDW: 16.7 % — ABNORMAL HIGH (ref 11.5–15.5)
WBC: 9 10*3/uL (ref 4.0–10.5)

## 2015-06-05 LAB — TROPONIN I
TROPONIN I: 0.04 ng/mL — AB (ref <0.031)
TROPONIN I: 0.05 ng/mL — AB (ref <0.031)
TROPONIN I: 0.05 ng/mL — AB (ref <0.031)
TROPONIN I: 0.05 ng/mL — AB (ref <0.031)

## 2015-06-05 LAB — BASIC METABOLIC PANEL
ANION GAP: 10 (ref 5–15)
BUN: 52 mg/dL — ABNORMAL HIGH (ref 6–20)
CHLORIDE: 111 mmol/L (ref 101–111)
CO2: 18 mmol/L — ABNORMAL LOW (ref 22–32)
Calcium: 8.8 mg/dL — ABNORMAL LOW (ref 8.9–10.3)
Creatinine, Ser: 2.47 mg/dL — ABNORMAL HIGH (ref 0.44–1.00)
GFR calc Af Amer: 21 mL/min — ABNORMAL LOW (ref >60)
GFR, EST NON AFRICAN AMERICAN: 18 mL/min — AB (ref >60)
Glucose, Bld: 241 mg/dL — ABNORMAL HIGH (ref 65–99)
POTASSIUM: 5 mmol/L (ref 3.5–5.1)
SODIUM: 139 mmol/L (ref 135–145)

## 2015-06-05 LAB — GLUCOSE, CAPILLARY
GLUCOSE-CAPILLARY: 298 mg/dL — AB (ref 65–99)
GLUCOSE-CAPILLARY: 269 mg/dL — AB (ref 65–99)

## 2015-06-05 LAB — BRAIN NATRIURETIC PEPTIDE: B NATRIURETIC PEPTIDE 5: 333 pg/mL — AB (ref 0.0–100.0)

## 2015-06-05 MED ORDER — CLOPIDOGREL BISULFATE 75 MG PO TABS
75.0000 mg | ORAL_TABLET | Freq: Every day | ORAL | Status: DC
  Administered 2015-06-05 – 2015-06-07 (×3): 75 mg via ORAL
  Filled 2015-06-05 (×3): qty 1

## 2015-06-05 MED ORDER — FUROSEMIDE 10 MG/ML IJ SOLN
40.0000 mg | Freq: Once | INTRAMUSCULAR | Status: AC
  Administered 2015-06-05: 40 mg via INTRAVENOUS
  Filled 2015-06-05: qty 4

## 2015-06-05 MED ORDER — SODIUM CHLORIDE 0.9% FLUSH
3.0000 mL | INTRAVENOUS | Status: DC | PRN
  Administered 2015-06-05: 3 mL via INTRAVENOUS
  Filled 2015-06-05: qty 3

## 2015-06-05 MED ORDER — ACETAMINOPHEN 325 MG PO TABS
650.0000 mg | ORAL_TABLET | ORAL | Status: DC | PRN
  Administered 2015-06-06: 650 mg via ORAL
  Filled 2015-06-05: qty 2

## 2015-06-05 MED ORDER — IPRATROPIUM-ALBUTEROL 0.5-2.5 (3) MG/3ML IN SOLN
3.0000 mL | Freq: Once | RESPIRATORY_TRACT | Status: AC
  Administered 2015-06-05: 3 mL via RESPIRATORY_TRACT
  Filled 2015-06-05: qty 3

## 2015-06-05 MED ORDER — IPRATROPIUM-ALBUTEROL 0.5-2.5 (3) MG/3ML IN SOLN
3.0000 mL | RESPIRATORY_TRACT | Status: DC
  Administered 2015-06-05 (×3): 3 mL via RESPIRATORY_TRACT
  Filled 2015-06-05 (×3): qty 3

## 2015-06-05 MED ORDER — IPRATROPIUM-ALBUTEROL 0.5-2.5 (3) MG/3ML IN SOLN
3.0000 mL | Freq: Three times a day (TID) | RESPIRATORY_TRACT | Status: DC
  Administered 2015-06-06 – 2015-06-07 (×4): 3 mL via RESPIRATORY_TRACT
  Filled 2015-06-05 (×4): qty 3

## 2015-06-05 MED ORDER — ALBUTEROL SULFATE (2.5 MG/3ML) 0.083% IN NEBU: 2.5000 mg | INHALATION_SOLUTION | RESPIRATORY_TRACT | Status: DC | PRN

## 2015-06-05 MED ORDER — INSULIN ASPART 100 UNIT/ML ~~LOC~~ SOLN
0.0000 [IU] | Freq: Every day | SUBCUTANEOUS | Status: DC
  Administered 2015-06-05: 2 [IU] via SUBCUTANEOUS
  Administered 2015-06-06: 4 [IU] via SUBCUTANEOUS

## 2015-06-05 MED ORDER — HEPARIN SODIUM (PORCINE) 5000 UNIT/ML IJ SOLN
5000.0000 [IU] | Freq: Three times a day (TID) | INTRAMUSCULAR | Status: DC
  Administered 2015-06-05 – 2015-06-07 (×6): 5000 [IU] via SUBCUTANEOUS
  Filled 2015-06-05 (×6): qty 1

## 2015-06-05 MED ORDER — INSULIN ASPART 100 UNIT/ML ~~LOC~~ SOLN
0.0000 [IU] | Freq: Three times a day (TID) | SUBCUTANEOUS | Status: DC
  Administered 2015-06-05 (×2): 8 [IU] via SUBCUTANEOUS
  Administered 2015-06-06: 2 [IU] via SUBCUTANEOUS
  Administered 2015-06-06 – 2015-06-07 (×3): 3 [IU] via SUBCUTANEOUS

## 2015-06-05 MED ORDER — AZITHROMYCIN 250 MG PO TABS
500.0000 mg | ORAL_TABLET | Freq: Every day | ORAL | Status: AC
  Administered 2015-06-05: 500 mg via ORAL
  Filled 2015-06-05: qty 2

## 2015-06-05 MED ORDER — FUROSEMIDE 10 MG/ML IJ SOLN
40.0000 mg | Freq: Two times a day (BID) | INTRAMUSCULAR | Status: DC
  Administered 2015-06-05 – 2015-06-06 (×3): 40 mg via INTRAVENOUS
  Filled 2015-06-05 (×3): qty 4

## 2015-06-05 MED ORDER — ONDANSETRON HCL 4 MG/2ML IJ SOLN: 4.0000 mg | Freq: Four times a day (QID) | INTRAMUSCULAR | Status: DC | PRN

## 2015-06-05 MED ORDER — GUAIFENESIN ER 600 MG PO TB12
1200.0000 mg | ORAL_TABLET | Freq: Two times a day (BID) | ORAL | Status: DC
  Administered 2015-06-05 – 2015-06-07 (×5): 1200 mg via ORAL
  Filled 2015-06-05 (×5): qty 2

## 2015-06-05 MED ORDER — MOMETASONE FURO-FORMOTEROL FUM 200-5 MCG/ACT IN AERO
2.0000 | INHALATION_SPRAY | Freq: Two times a day (BID) | RESPIRATORY_TRACT | Status: DC
  Administered 2015-06-05 – 2015-06-07 (×5): 2 via RESPIRATORY_TRACT
  Filled 2015-06-05: qty 8.8

## 2015-06-05 MED ORDER — ASPIRIN EC 81 MG PO TBEC
81.0000 mg | DELAYED_RELEASE_TABLET | Freq: Every day | ORAL | Status: DC
  Administered 2015-06-05 – 2015-06-07 (×3): 81 mg via ORAL
  Filled 2015-06-05 (×3): qty 1

## 2015-06-05 MED ORDER — GABAPENTIN 100 MG PO CAPS
100.0000 mg | ORAL_CAPSULE | Freq: Three times a day (TID) | ORAL | Status: DC
  Administered 2015-06-05 – 2015-06-07 (×7): 100 mg via ORAL
  Filled 2015-06-05 (×7): qty 1

## 2015-06-05 MED ORDER — DILTIAZEM HCL ER COATED BEADS 120 MG PO CP24
120.0000 mg | ORAL_CAPSULE | Freq: Every day | ORAL | Status: DC
  Administered 2015-06-05 – 2015-06-07 (×3): 120 mg via ORAL
  Filled 2015-06-05 (×3): qty 1

## 2015-06-05 MED ORDER — PREDNISONE 20 MG PO TABS
60.0000 mg | ORAL_TABLET | Freq: Every day | ORAL | Status: DC
  Administered 2015-06-06 – 2015-06-07 (×2): 60 mg via ORAL
  Filled 2015-06-05 (×3): qty 3

## 2015-06-05 MED ORDER — SODIUM CHLORIDE 0.9% FLUSH
3.0000 mL | Freq: Two times a day (BID) | INTRAVENOUS | Status: DC
  Administered 2015-06-05 – 2015-06-07 (×4): 3 mL via INTRAVENOUS

## 2015-06-05 MED ORDER — ATORVASTATIN CALCIUM 40 MG PO TABS
40.0000 mg | ORAL_TABLET | Freq: Every day | ORAL | Status: DC
  Administered 2015-06-05 – 2015-06-06 (×2): 40 mg via ORAL
  Filled 2015-06-05 (×2): qty 1

## 2015-06-05 MED ORDER — AZITHROMYCIN 250 MG PO TABS
250.0000 mg | ORAL_TABLET | Freq: Every day | ORAL | Status: DC
  Administered 2015-06-06 – 2015-06-07 (×2): 250 mg via ORAL
  Filled 2015-06-05 (×3): qty 1

## 2015-06-05 MED ORDER — INSULIN DEGLUDEC 100 UNIT/ML ~~LOC~~ SOPN: 6.0000 [IU] | PEN_INJECTOR | Freq: Every day | SUBCUTANEOUS | Status: DC

## 2015-06-05 MED ORDER — INSULIN DETEMIR 100 UNIT/ML ~~LOC~~ SOLN
5.0000 [IU] | Freq: Every day | SUBCUTANEOUS | Status: DC
  Administered 2015-06-05 – 2015-06-07 (×3): 5 [IU] via SUBCUTANEOUS
  Filled 2015-06-05 (×4): qty 0.05

## 2015-06-05 MED ORDER — SODIUM CHLORIDE 0.9 % IV SOLN: 250.0000 mL | INTRAVENOUS | Status: DC | PRN

## 2015-06-05 NOTE — H&P (Signed)
[facility] History and Physical  Jody [name] [id number] DOB: [birth year] DOA: 06/05/2015  Referring physician: Dr. Dina Rich, ED PCP: Monico Blitz, MD   Chief Complaint: shortness of breath  HPI: Jody [name] is a 75 y.o. female with a history of COPD and chronic diastolic congestive heart failure, presents to the hospital with complaints shortness of breath. She reports that she's had intermittent shortness of breath for the past 2-3 weeks. Overnight this substantially got worse. She describes orthopnea. She's had a cough in the past 24 hours. She does not have any chest pain. No fever. She's also noticed worsening swelling in her extremities. Her shortness of breath was associated with wheezing. EMS was called and she received duonebs and Solu-Medrol which she feels significantly improved her symptoms. She was also noted to be hypoxic on room air on EMS arrival. On arrival to the emergency room chest x-ray showed evidence of pulmonary edema. She was given a dose of intravenous Lasix with further improvement of her symptoms. Patient was ambulated to the bathroom and became significant short of breath. She is referred for observation for further adjustment of medications.   Review of Systems:  Pertinent positives as per HPI, otherwise negative  Past Medical History  Diagnosis Date  . Hypertension   . Heart disease   . Gait difficulty 09/02/2014  . Hypercholesterolemia   . COPD (chronic obstructive pulmonary disease) ([place])   . Type II diabetes mellitus ([place])   . Chronic bronchitis ([place])     "get it q yr" (12/07/2014)  . Stroke ([place]) 05/2014    denies residual on 12/07/2014  . Arthritis     "bad in my legs" (12/07/2014)  . Chronic back pain     "dr said my spine is crooked"   Past Surgical History  Procedure Laterality Date  . Joint replacement    . Tumor excision  ~ 1970    "9# in my stomach"  . Appendectomy  ~ 1970  . Total knee arthroplasty Right ~ 1986  .  Abdominal hysterectomy  ~ 1970   Social History:  reports that she quit smoking about 2 years ago. Her smoking use included Cigarettes. She started smoking about 62 years ago. She has a 50 pack-year smoking history. She has never used smokeless tobacco. She reports that she drinks alcohol. She reports that she does not use illicit drugs.  Allergies  Allergen Reactions  . Codeine Nausea And Vomiting    Family History  Problem Relation Age of Onset  . Cancer Mother   . Cancer Other      Prior to Admission medications   Medication Sig Start Date End Date Taking? Authorizing Provider  albuterol (PROVENTIL HFA;VENTOLIN HFA) 108 (90 BASE) MCG/ACT inhaler Inhale 1-2 puffs into the lungs every 6 (six) hours as needed for wheezing or shortness of breath. Reported on 03/01/2015   Yes Historical Provider, MD  amLODipine (NORVASC) 5 MG tablet Take 1 tablet (5 mg total) by mouth daily. 03/01/15  Yes Herminio Commons, MD  atorvastatin (LIPITOR) 20 MG tablet Take 1 tablet (20 mg total) by mouth daily. 01/14/15  Yes Herminio Commons, MD  clopidogrel (PLAVIX) 75 MG tablet Take 75 mg by mouth daily.   Yes Historical Provider, MD  diltiazem (CARDIZEM CD) 120 MG 24 hr capsule Take 1 capsule (120 mg total) by mouth daily. 01/14/15  Yes Herminio Commons, MD  fluticasone-salmeterol (ADVAIR HFA) 230-21 MCG/ACT inhaler Inhale 2 puffs into the lungs 2 (two) times daily.   Yes  Historical Provider, MD  gabapentin (NEURONTIN) 100 MG capsule Take 100 mg by mouth 3 (three) times daily.   Yes Historical Provider, MD  Insulin Degludec (TRESIBA FLEXTOUCH) 100 UNIT/ML SOPN Inject 6 Units into the skin daily.   Yes Historical Provider, MD  atorvastatin (LIPITOR) 40 MG tablet Take 1 tablet by mouth daily. 06/02/15   Historical Provider, MD  furosemide (LASIX) 20 MG tablet Take 1 tablet by mouth daily. 06/04/15   Historical Provider, MD  potassium chloride (K-DUR) 10 MEQ tablet Take 1 tablet by mouth daily. 06/04/15    Historical Provider, MD   Physical Exam: Filed Vitals:   06/05/15 0715 06/05/15 0730 06/05/15 0800 06/05/15 0830  BP:  162/84 155/70   Pulse: 73  75 78  Temp:      TempSrc:      Resp: 20 21 19 20   Height:      Weight:      SpO2: 98%  98% 98%    Wt Readings from Last 3 Encounters:  06/05/15 92.08 kg (203 lb)  03/01/15 85.276 kg (188 lb)  01/14/15 87.544 kg (193 lb)    General:  Appears calm and comfortable Eyes: PERRL, normal lids, irises & conjunctiva ENT: grossly normal hearing, lips & tongue Neck: no LAD, masses or thyromegaly Cardiovascular: RRR, no m/r/g. Trace LE edema bilaterally. Telemetry: SR, no arrhythmias  Respiratory: CTA bilaterally, no w/r/r. Mildly increased respiratory effort. Abdomen: soft, ntnd Skin: no rash or induration seen on limited exam Musculoskeletal: grossly normal tone BUE/BLE Psychiatric: grossly normal mood and affect, speech fluent and appropriate Neurologic: grossly non-focal.          Labs on Admission:  Basic Metabolic Panel:  Recent Labs Lab 06/05/15 0548  NA 139  K 5.0  CL 111  CO2 18*  GLUCOSE 241*  BUN 52*  CREATININE 2.47*  CALCIUM 8.8*   Liver Function Tests: No results for input(s): AST, ALT, ALKPHOS, BILITOT, PROT, ALBUMIN in the last 168 hours. No results for input(s): LIPASE, AMYLASE in the last 168 hours. No results for input(s): AMMONIA in the last 168 hours. CBC:  Recent Labs Lab 06/05/15 0548  WBC 9.0  NEUTROABS 8.0*  HGB 7.2*  HCT 22.2*  MCV 91.7  PLT 179   Cardiac Enzymes:  Recent Labs Lab 06/05/15 0548  TROPONINI 0.04*    BNP (last 3 results)  Recent Labs  12/07/14 1650 06/05/15 0548  BNP 1242.0* 333.0*    ProBNP (last 3 results) No results for input(s): PROBNP in the last 8760 hours.  CBG: No results for input(s): GLUCAP in the last 168 hours.  Radiological Exams on Admission: Dg Chest Portable 1 View  06/05/2015  CLINICAL DATA:  Initial valuation for acute shortness of  breath. EXAM: PORTABLE CHEST 1 VIEW COMPARISON:  Prior study from 12/07/2014. FINDINGS: Cardiomegaly is unchanged. Mediastinal silhouette within normal limits. Tortuosity the intrathoracic aorta noted. Lungs are mildly hypoinflated. Diffuse pulmonary vascular congestion with indistinctness of the interstitial markings, consistent with pulmonary edema. Possible small bilateral pleural effusions. No definite focal infiltrates. No pneumothorax. Pleural calcification at the left upper lobe noted, stable. No acute osseus abnormality. IMPRESSION: 1. Cardiomegaly with moderate diffuse pulmonary edema. 2. Probable small bilateral pleural effusions. Electronically Signed   By: Jeannine Boga M.D.   On: 06/05/2015 06:21    EKG: Independently reviewed. No acute changes  Assessment/Plan Active Problems:   Essential hypertension   Hyperlipidemia   Type 2 diabetes mellitus with other circulatory complications (HCC)   COPD (chronic  obstructive pulmonary disease) ([place])   CKD stage 4 due to type 2 diabetes mellitus (HCC)   Anemia in chronic kidney disease (CODE) (HCC)   Acute on chronic diastolic CHF (congestive heart failure) (HCC)   Atrial fibrillation (HCC)   CHF exacerbation ([place])   1. Acute on chronic diastolic congestive heart failure. Start the patient on intravenous Lasix. Daily weights and intake and output. Will repeat echocardiogram to assess LV function. No ACE inhibitors due to renal dysfunction. Would avoid beta blockers due to COPD. Continue on aspirin. 2. COPD exacerbation. Appears to be clinically improving. Continue nebulizer treatments. Start Z-Pak and prednisone. 3. Anemia chronic kidney disease. Hemoglobin appears to be near baseline. We'll continue to monitor. 4. Chronic kidney disease stage IV. Creatinine is above baseline. Anticipate improvement with diuresis. Continue to monitor. 5. Insulin-dependent diabetes. Continue the patient on basal insulin and start on sliding  scale 6. Hyperlipidemia. Continue statin 7. History of atrial fibrillation. Continue on diltiazem. She was previously on Eliquis but reports that her cardiologist had recently taken her off of this. She is unsure exactly why. We'll continue on aspirin for now. 8. Hypertension. Continue diltiazem. She's also noted to be on amlodipine. Will hold for now.    Code Status: full code DVT Prophylaxis: heparin Family Communication: discussed with patient and niece at the bedside Disposition Plan: discharge home once improved  Time spent: 62mins  Rashawd Laskaris [facility] Pager [phone]

## 2015-06-05 NOTE — ED Notes (Signed)
Attempted to call report to RN, per unit secretary nurse unable to take report.

## 2015-06-05 NOTE — ED Notes (Signed)
Pt arrives EMS,  SOB x a few day, Saw pcp yesterday was given fluid pill for CHF, wheezing, 86% on room air at home, put on non rebreather. Given solumedrol 125mg  in route,  And albuterol 5mg ,

## 2015-06-05 NOTE — ED Notes (Signed)
Per MD, check 02 on room air, Lab and niece at the bedside

## 2015-06-05 NOTE — ED Notes (Signed)
Patient ambulatory to restroom with assistance. Pt SOB with walking, bedside commode placed in room

## 2015-06-05 NOTE — ED Provider Notes (Signed)
CSN: [id number]     Arrival date & time 06/05/15  0506 History   First MD Initiated Contact with Patient 06/05/15 0536     Chief Complaint  Patient presents with  . Shortness of Breath     (Consider location/radiation/quality/duration/timing/severity/associated sxs/prior Treatment) HPI  This is a 75 year old female with history of hypertension, hypercholesterolemia, COPD, diabetes who presents with shortness of breath. Patient reports 2-3 week history of worsening shortness of breath. She states that it is worse on exertion. Reports orthopnea.  She is also noted lower extremity swelling. Denies any chest pain, fever.  She does report a dry cough. She was seen by her primary physician yesterday and given "a fluid pill." She has not been able to pick it up. She was evaluated by EMS and found to have room air saturations of 86%. She was given Solu-Medrol, albuterol en route. She reports marked improvement of her symptoms.   Past Medical History  Diagnosis Date  . Hypertension   . Heart disease   . Gait difficulty 09/02/2014  . Hypercholesterolemia   . COPD (chronic obstructive pulmonary disease) ([place])   . Type II diabetes mellitus ([place])   . Chronic bronchitis ([place])     "get it q yr" (12/07/2014)  . Stroke ([place]) 05/2014    denies residual on 12/07/2014  . Arthritis     "bad in my legs" (12/07/2014)  . Chronic back pain     "dr said my spine is crooked"   Past Surgical History  Procedure Laterality Date  . Joint replacement    . Tumor excision  ~ 1970    "9# in my stomach"  . Appendectomy  ~ 1970  . Total knee arthroplasty Right ~ 1986  . Abdominal hysterectomy  ~ 1970   Family History  Problem Relation Age of Onset  . Cancer Mother   . Cancer Other    Social History  Substance Use Topics  . Smoking status: Former Smoker -- 1.00 packs/day for 50 years    Types: Cigarettes    Start date: 03/07/1953    Quit date: 01/13/2013  . Smokeless tobacco: Never Used     Comment:  "quit smoking cigarettes  in ~ 2005"  . Alcohol Use: 0.0 oz/week    0 Standard drinks or equivalent per week     Comment: 10/242016 "quit drinking beer in ~ 1977"   OB History    No data available     Review of Systems  Constitutional: Negative for fever.  Respiratory: Positive for cough, shortness of breath and wheezing.   Cardiovascular: Positive for leg swelling. Negative for chest pain.  Gastrointestinal: Negative for nausea, vomiting and abdominal pain.  Genitourinary: Negative for dysuria.  All other systems reviewed and are negative.     Allergies  Codeine  Home Medications   Prior to Admission medications   Medication Sig Start Date End Date Taking? Authorizing Provider  albuterol (PROVENTIL HFA;VENTOLIN HFA) 108 (90 BASE) MCG/ACT inhaler Inhale 1-2 puffs into the lungs every 6 (six) hours as needed for wheezing or shortness of breath. Reported on 03/01/2015    Historical Provider, MD  amLODipine (NORVASC) 5 MG tablet Take 1 tablet (5 mg total) by mouth daily. 03/01/15   Herminio Commons, MD  apixaban (ELIQUIS) 2.5 MG TABS tablet Take 2.5 mg by mouth 2 (two) times daily.    Historical Provider, MD  atorvastatin (LIPITOR) 20 MG tablet Take 1 tablet (20 mg total) by mouth daily. 01/14/15   Lenise Herald  Bronson Ing, MD  diltiazem (CARDIZEM CD) 120 MG 24 hr capsule Take 1 capsule (120 mg total) by mouth daily. 01/14/15   Herminio Commons, MD  furosemide (LASIX) 40 MG tablet Take 40 mg by mouth daily.  10/13/13   Historical Provider, MD  glipiZIDE (GLUCOTROL) 5 MG tablet Take 0.5 tablets (2.5 mg total) by mouth daily before breakfast. 12/11/14   Velvet Bathe, MD  metFORMIN (GLUCOPHAGE) 1000 MG tablet Take 1 tablet by mouth 2 (two) times daily. 11/20/14   Historical Provider, MD  Multiple Vitamin (MULTIVITAMIN WITH MINERALS) TABS tablet Take 1 tablet by mouth daily.    Historical Provider, MD  pregabalin (LYRICA) 50 MG capsule Take 50 mg by mouth daily.    Historical Provider, MD   SYMBICORT 160-4.5 MCG/ACT inhaler Inhale 2 puffs into the lungs daily as needed (for shortness of breath).  10/13/13   Historical Provider, MD   BP 145/60 mmHg  Pulse 69  Temp(Src) 98.4 F (36.9 C) (Oral)  Resp 17  Ht 5\' 7"  (1.702 m)  Wt 203 lb (92.08 kg)  BMI 31.79 kg/m2  SpO2 97% Physical Exam  Constitutional: She is oriented to person, place, and time. No distress.  Elderly, chronically ill-appearing, no acute distress  HENT:  Head: Normocephalic and atraumatic.  Eyes: Pupils are equal, round, and reactive to light.  Cardiovascular: Normal rate, regular rhythm and normal heart sounds.   No murmur heard. Pulmonary/Chest: Effort normal. No respiratory distress. She has wheezes.  Diffuse expiratory wheezing, diminished breath sounds bases, mild tachypnea  Abdominal: Soft. Bowel sounds are normal. There is no tenderness. There is no rebound.  Musculoskeletal:  1+ bilateral lower extremity pitting edema  Neurological: She is alert and oriented to person, place, and time.  Skin: Skin is warm and dry.  Psychiatric: She has a normal mood and affect.  Nursing note and vitals reviewed.   ED Course  Procedures (including critical care time) Labs Review Labs Reviewed  CBC WITH DIFFERENTIAL/PLATELET - Abnormal; Notable for the following:    RBC 2.42 (*)    Hemoglobin 7.2 (*)    HCT 22.2 (*)    RDW 16.7 (*)    Neutro Abs 8.0 (*)    Lymphs Abs 0.6 (*)    All other components within normal limits  BASIC METABOLIC PANEL - Abnormal; Notable for the following:    CO2 18 (*)    Glucose, Bld 241 (*)    BUN 52 (*)    Creatinine, Ser 2.47 (*)    Calcium 8.8 (*)    GFR calc non Af Amer 18 (*)    GFR calc Af Amer 21 (*)    All other components within normal limits  BRAIN NATRIURETIC PEPTIDE - Abnormal; Notable for the following:    B Natriuretic Peptide 333.0 (*)    All other components within normal limits  TROPONIN I - Abnormal; Notable for the following:    Troponin I 0.04 (*)     All other components within normal limits    Imaging Review Dg Chest Portable 1 View  06/05/2015  CLINICAL DATA:  Initial valuation for acute shortness of breath. EXAM: PORTABLE CHEST 1 VIEW COMPARISON:  Prior study from 12/07/2014. FINDINGS: Cardiomegaly is unchanged. Mediastinal silhouette within normal limits. Tortuosity the intrathoracic aorta noted. Lungs are mildly hypoinflated. Diffuse pulmonary vascular congestion with indistinctness of the interstitial markings, consistent with pulmonary edema. Possible small bilateral pleural effusions. No definite focal infiltrates. No pneumothorax. Pleural calcification at the left upper lobe noted,  stable. No acute osseus abnormality. IMPRESSION: 1. Cardiomegaly with moderate diffuse pulmonary edema. 2. Probable small bilateral pleural effusions. Electronically Signed   By: Jeannine Boga M.D.   On: 06/05/2015 06:21   I have personally reviewed and evaluated these images and lab results as part of my medical decision-making.   EKG Interpretation   Date/Time:  Saturday June 05 2015 05:12:57 EDT Ventricular Rate:  69 PR Interval:  181 QRS Duration: 112 QT Interval:  420 QTC Calculation: 450 R Axis:   20 Text Interpretation:  Sinus or ectopic atrial rhythm Atrial premature  complexes Incomplete left bundle branch block Confirmed by HORTON  MD,  COURTNEY ([id number]) on 06/05/2015 6:18:49 AM      MDM   Final diagnoses:  COPD exacerbation ([place])    Patient presents with shortness of breath. Found to be hypoxic by EMS. History of COPD. She also takes Lasix daily but last echo with a normal EF. She has mild tachypnea on exam. Feels better after breathing treatment. She received steroids in route. She continues to have some wheezing.  Patient was given repeat DuoNeb. EKG is reassuring. Chest x-ray shows cardiomegaly with diffuse pulmonary edema. Troponin is 0.04. Patient is without chest pain. Creatinine mildly elevated above baseline at 2.47.  BNP 333.  7:35 AM On recheck, patient reports overall improvement of symptoms. She is satting 97% on room air. She states that she became very winded walking to the bathroom and felt very "wheezy."  She continues to have some end expiratory wheezing.  The patient's presentation is likely some combination of pulmonary edema and COPD exacerbation. Patient was given 40 mg of IV Lasix for diuresis. Given persistent respiratory symptoms and wheezing, will admit for further management. Repeat DuoNeb was also ordered. Patient may need repeat echocardiogram given last echo showed no evidence of systolic or diastolic dysfunction.     Merryl Hacker, MD 06/05/15 [phone]

## 2015-06-05 NOTE — ED Notes (Signed)
Fall risk bracelet and non-skid socks placed on pt due to increased urinary frequency with lasix.

## 2015-06-06 ENCOUNTER — Observation Stay (HOSPITAL_BASED_OUTPATIENT_CLINIC_OR_DEPARTMENT_OTHER): Payer: Medicare HMO

## 2015-06-06 DIAGNOSIS — N184 Chronic kidney disease, stage 4 (severe): Secondary | ICD-10-CM | POA: Diagnosis present

## 2015-06-06 DIAGNOSIS — E1122 Type 2 diabetes mellitus with diabetic chronic kidney disease: Secondary | ICD-10-CM | POA: Diagnosis present

## 2015-06-06 DIAGNOSIS — Z794 Long term (current) use of insulin: Secondary | ICD-10-CM | POA: Diagnosis not present

## 2015-06-06 DIAGNOSIS — I4891 Unspecified atrial fibrillation: Secondary | ICD-10-CM | POA: Diagnosis present

## 2015-06-06 DIAGNOSIS — Z87891 Personal history of nicotine dependence: Secondary | ICD-10-CM | POA: Diagnosis not present

## 2015-06-06 DIAGNOSIS — R0602 Shortness of breath: Secondary | ICD-10-CM | POA: Diagnosis present

## 2015-06-06 DIAGNOSIS — E7800 Pure hypercholesterolemia, unspecified: Secondary | ICD-10-CM | POA: Diagnosis present

## 2015-06-06 DIAGNOSIS — Z8673 Personal history of transient ischemic attack (TIA), and cerebral infarction without residual deficits: Secondary | ICD-10-CM | POA: Diagnosis not present

## 2015-06-06 DIAGNOSIS — I509 Heart failure, unspecified: Secondary | ICD-10-CM

## 2015-06-06 DIAGNOSIS — Z7951 Long term (current) use of inhaled steroids: Secondary | ICD-10-CM | POA: Diagnosis not present

## 2015-06-06 DIAGNOSIS — I130 Hypertensive heart and chronic kidney disease with heart failure and stage 1 through stage 4 chronic kidney disease, or unspecified chronic kidney disease: Secondary | ICD-10-CM | POA: Diagnosis present

## 2015-06-06 DIAGNOSIS — R0902 Hypoxemia: Secondary | ICD-10-CM | POA: Diagnosis present

## 2015-06-06 DIAGNOSIS — E785 Hyperlipidemia, unspecified: Secondary | ICD-10-CM | POA: Diagnosis present

## 2015-06-06 DIAGNOSIS — I5033 Acute on chronic diastolic (congestive) heart failure: Secondary | ICD-10-CM | POA: Diagnosis present

## 2015-06-06 DIAGNOSIS — Z7902 Long term (current) use of antithrombotics/antiplatelets: Secondary | ICD-10-CM | POA: Diagnosis not present

## 2015-06-06 DIAGNOSIS — D631 Anemia in chronic kidney disease: Secondary | ICD-10-CM | POA: Diagnosis present

## 2015-06-06 DIAGNOSIS — Z96651 Presence of right artificial knee joint: Secondary | ICD-10-CM | POA: Diagnosis present

## 2015-06-06 DIAGNOSIS — J441 Chronic obstructive pulmonary disease with (acute) exacerbation: Secondary | ICD-10-CM | POA: Diagnosis present

## 2015-06-06 LAB — GLUCOSE, CAPILLARY
GLUCOSE-CAPILLARY: 223 mg/dL — AB (ref 65–99)
Glucose-Capillary: 152 mg/dL — ABNORMAL HIGH (ref 65–99)
Glucose-Capillary: 122 mg/dL — ABNORMAL HIGH (ref 65–99)
Glucose-Capillary: 231 mg/dL — ABNORMAL HIGH (ref 65–99)

## 2015-06-06 LAB — BASIC METABOLIC PANEL
Anion gap: 9 (ref 5–15)
BUN: 63 mg/dL — AB (ref 6–20)
CALCIUM: 9 mg/dL (ref 8.9–10.3)
CHLORIDE: 109 mmol/L (ref 101–111)
CO2: 20 mmol/L — AB (ref 22–32)
CREATININE: 2.58 mg/dL — AB (ref 0.44–1.00)
GFR calc Af Amer: 20 mL/min — ABNORMAL LOW (ref >60)
GFR calc non Af Amer: 17 mL/min — ABNORMAL LOW (ref >60)
GLUCOSE: 183 mg/dL — AB (ref 65–99)
Potassium: 5.1 mmol/L (ref 3.5–5.1)
Sodium: 138 mmol/L (ref 135–145)

## 2015-06-06 LAB — CBC
HCT: 19.6 % — ABNORMAL LOW (ref 36.0–46.0)
Hemoglobin: 6.4 g/dL — CL (ref 12.0–15.0)
MCH: 29.6 pg (ref 26.0–34.0)
MCHC: 32.7 g/dL (ref 30.0–36.0)
MCV: 90.7 fL (ref 78.0–100.0)
PLATELETS: 172 10*3/uL (ref 150–400)
RBC: 2.16 MIL/uL — ABNORMAL LOW (ref 3.87–5.11)
RDW: 16.6 % — ABNORMAL HIGH (ref 11.5–15.5)
WBC: 9.6 10*3/uL (ref 4.0–10.5)

## 2015-06-06 LAB — ABO/RH: ABO/RH(D): O POS

## 2015-06-06 LAB — ECHOCARDIOGRAM COMPLETE
HEIGHTINCHES: 67 in
WEIGHTICAEL: 3164.8 [oz_av]

## 2015-06-06 LAB — PREPARE RBC (CROSSMATCH)

## 2015-06-06 MED ORDER — FUROSEMIDE 40 MG PO TABS: 40.0000 mg | ORAL_TABLET | Freq: Every day | ORAL | Status: DC

## 2015-06-06 MED ORDER — FUROSEMIDE 10 MG/ML IJ SOLN
40.0000 mg | Freq: Two times a day (BID) | INTRAMUSCULAR | Status: DC
  Administered 2015-06-06 – 2015-06-07 (×2): 40 mg via INTRAVENOUS
  Filled 2015-06-06 (×2): qty 4

## 2015-06-06 MED ORDER — SODIUM CHLORIDE 0.9 % IV SOLN
Freq: Once | INTRAVENOUS | Status: AC
  Administered 2015-06-06: 1000 mL via INTRAVENOUS

## 2015-06-06 NOTE — Discharge Summary (Signed)
Physician Discharge Summary  Jody [name] [id number] DOB: 09/17/1940 DOA: 06/05/2015  PCP: Monico Blitz, MD  Admit date: 06/05/2015 Discharge date: 06/06/2015  Time spent: 35 minutes  Recommendations for Outpatient Follow-up:  1. Follow up with PCP in 1-2 weeks.  2. Repeat CBC with PCP to follow up anemia. 3. Follow up with outpatient nephrology as scheduled 5/2 with Dr. Lowanda Foster   Discharge Diagnoses:  Active Problems:   Essential hypertension   Hyperlipidemia   Type 2 diabetes mellitus with other circulatory complications (HCC)   COPD (chronic obstructive pulmonary disease) (HCC)   CKD stage 4 due to type 2 diabetes mellitus (HCC)   Anemia in chronic kidney disease (CODE) (HCC)   Acute on chronic diastolic CHF (congestive heart failure) (HCC)   Atrial fibrillation (HCC)   CHF exacerbation ([place])   Discharge Condition: Improved   Diet recommendation: Heart healthy/low sodium   Filed Weights   06/05/15 0510 06/06/15 0447  Weight: 92.08 kg (203 lb) 89.721 kg (197 lb 12.8 oz)    History of present illness:  75 y.o. female with a history of COPD and chronic diastolic congestive heart failure, presents to the hospital with complaints shortness of breath. She reports that she's had intermittent shortness of breath for the past 2-3 weeks. Overnight this substantially got worse. She describes orthopnea. She's had a cough in the past 24 hours. She does not have any chest pain. No fever. She's also noticed worsening swelling in her extremities. Her shortness of breath was associated with wheezing. EMS was called and she received duonebs and Solu-Medrol which she feels significantly improved her symptoms. She was also noted to be hypoxic on room air on EMS arrival. On arrival to the emergency room chest x-ray showed evidence of pulmonary edema. She was given a dose of intravenous Lasix with further improvement of her symptoms. Patient was ambulated to the bathroom and became significant  short of breath. She is referred for observation for further adjustment of medications.   Hospital Course:  Jody [name] was admitted for treatment of acute on chronic diastolic congestive heart failure. She was started on intravenous Lasix, with excellent diuersis. Daily weights and intake and output monitored. Repeat echocardiogram to assess LV function was performed with results as below. ACE inhibitors held due to renal dysfunction and avoid beta blockers due to COPD. Continue on aspirin. Discussed the importance of low sodium diet. She now appears to be euvolemic. Recommended increase in oral lasix on discharge.  1. COPD exacerbation. Appears to be clinically improving. Continue nebulizer treatments. Started on Z-Pak and prednisone, transitioned to oral abx and started on steroid taper on discharge.  2. Anemia chronic kidney disease. Hgb noted to trend below baseline. No obvious source of bleeding. Improvement back to baseline s/p 2U PRBCs 3. Chronic kidney disease stage IV. Creatinine above baseline. Anticipate improvement with diuresis. Continue to monitor. 4. Insulin-dependent diabetes. Continue the patient on basal insulin and start on sliding scale 5. Hyperlipidemia. Continue statin 6. History of atrial fibrillation. Continue on diltiazem. She was previously on Eliquis but reports that her cardiologist had recently taken her off of this. She is unsure exactly why. Continue on aspirin for now. 7. Hypertension. Continue diltiazem. She's also noted to be on amlodipine, which was held during hospitalization.  Procedures:  2U PRBCs 4/23 Echo: - Left ventricle: The cavity size was normal. There was mild  concentric hypertrophy. Systolic function was vigorous. The  estimated ejection fraction was in the range of 65% to 70%.  Features are  consistent with a pseudonormal left ventricular  filling pattern, with concomitant abnormal relaxation and  increased filling pressure (grade 2  diastolic dysfunction).  Doppler parameters are consistent with elevated ventricular  end-diastolic filling pressure. - Aortic valve: Structurally normal valve. There was no  regurgitation. - Mitral valve: Structurally normal valve. There was mild  regurgitation. - Left atrium: The atrium was moderately dilated. - Right ventricle: The cavity size was normal. Wall thickness was  normal. Systolic function was normal. - Tricuspid valve: There was mild regurgitation. - Pulmonary arteries: Systolic pressure was mildly to moderately  increased. PA peak pressure: 45 mm Hg (S). - Inferior vena cava: The vessel was dilated. The respirophasic  diameter changes were blunted (< 50%), consistent with elevated  central venous pressure. - Pericardium, extracardiac: The pericardium was normal in   appearance.  Consultations:  None   Discharge Exam: Filed Vitals:   06/05/15 0943 06/06/15 0447  BP: 159/69 164/65  Pulse: 79 71  Temp: 98.1 F (36.7 C) 98.6 F (37 C)  Resp: 20 20    General: NAD  Cardiovascular: RRR, S1, S2   Respiratory: clear bilaterally, No wheezing, rales or rhonchi  Abdomen: soft, non tender, no distention , bowel sounds normal  Musculoskeletal: No edema b/l  Discharge Instructions    Current Discharge Medication List    CONTINUE these medications which have NOT CHANGED   Details  albuterol (PROVENTIL HFA;VENTOLIN HFA) 108 (90 BASE) MCG/ACT inhaler Inhale 1-2 puffs into the lungs every 6 (six) hours as needed for wheezing or shortness of breath. Reported on 03/01/2015    amLODipine (NORVASC) 5 MG tablet Take 1 tablet (5 mg total) by mouth daily. Qty: 30 tablet, Refills: 6    !! atorvastatin (LIPITOR) 20 MG tablet Take 1 tablet (20 mg total) by mouth daily. Qty: 30 tablet, Refills: 6    clopidogrel (PLAVIX) 75 MG tablet Take 75 mg by mouth daily.    diltiazem (CARDIZEM CD) 120 MG 24 hr capsule Take 1 capsule (120 mg total) by mouth daily. Qty:  30 capsule, Refills: 6    fluticasone-salmeterol (ADVAIR HFA) 230-21 MCG/ACT inhaler Inhale 2 puffs into the lungs 2 (two) times daily.    gabapentin (NEURONTIN) 100 MG capsule Take 100 mg by mouth 3 (three) times daily.    Insulin Degludec (TRESIBA FLEXTOUCH) 100 UNIT/ML SOPN Inject 6 Units into the skin daily.    !! atorvastatin (LIPITOR) 40 MG tablet Take 1 tablet by mouth daily.    furosemide (LASIX) 20 MG tablet Take 1 tablet by mouth daily.    potassium chloride (K-DUR) 10 MEQ tablet Take 1 tablet by mouth daily.     !! - Potential duplicate medications found. Please discuss with provider.     Allergies  Allergen Reactions  . Codeine Nausea And Vomiting      The results of significant diagnostics from this hospitalization (including imaging, microbiology, ancillary and laboratory) are listed below for reference.    Significant Diagnostic Studies: Dg Chest Portable 1 View  06/05/2015  CLINICAL DATA:  Initial valuation for acute shortness of breath. EXAM: PORTABLE CHEST 1 VIEW COMPARISON:  Prior study from 12/07/2014. FINDINGS: Cardiomegaly is unchanged. Mediastinal silhouette within normal limits. Tortuosity the intrathoracic aorta noted. Lungs are mildly hypoinflated. Diffuse pulmonary vascular congestion with indistinctness of the interstitial markings, consistent with pulmonary edema. Possible small bilateral pleural effusions. No definite focal infiltrates. No pneumothorax. Pleural calcification at the left upper lobe noted, stable. No acute osseus abnormality. IMPRESSION: 1. Cardiomegaly with moderate  diffuse pulmonary edema. 2. Probable small bilateral pleural effusions. Electronically Signed   By: Jeannine Boga M.D.   On: 06/05/2015 06:21    Microbiology: No results found for this or any previous visit (from the past 240 hour(s)).   Labs: Basic Metabolic Panel:  Recent Labs Lab 06/05/15 0548 06/06/15 0555  NA 139 138  K 5.0 5.1  CL 111 109  CO2 18* 20*   GLUCOSE 241* 183*  BUN 52* 63*  CREATININE 2.47* 2.58*  CALCIUM 8.8* 9.0  CBC:  Recent Labs Lab 06/05/15 0548 06/06/15 0555  WBC 9.0 9.6  NEUTROABS 8.0*  --   HGB 7.2* 6.4*  HCT 22.2* 19.6*  MCV 91.7 90.7  PLT 179 172   Cardiac Enzymes:  Recent Labs Lab 06/05/15 0548 06/05/15 0921 06/05/15 1509 06/05/15 2046  TROPONINI 0.04* 0.05* 0.05* 0.05*   BNP: BNP (last 3 results)  Recent Labs  12/07/14 1650 06/05/15 0548  BNP 1242.0* 333.0*   CBG:  Recent Labs Lab 06/05/15 1137 06/05/15 1659 06/05/15 2103 06/06/15 0740  GLUCAP 298* 269* 223* 152*       Signed: Kathie Dike, MD  [facility] 06/06/2015, 9:03 AM   By signing my name below, I, Rennis Harding, attest that this documentation has been prepared under the direction and in the presence of Kathie Dike, MD. Electronically signed: Rennis Harding, Scribe. 06/06/2015 10:55am  I, Dr. Kathie Dike, personally performed the services described in this documentaiton. All medical record entries made by the scribe were at my direction and in my presence. I have reviewed the chart and agree that the record reflects my personal performance and is accurate and complete  Kathie Dike, MD, 06/07/2015 11:12 AM

## 2015-06-06 NOTE — Progress Notes (Signed)
MD made aware of patient critical hb of 6.4 this AM

## 2015-06-06 NOTE — Care Management Obs Status (Signed)
[place] NOTIFICATION   Patient Details  [name] MRN: [id number] Date of Birth: 1940-08-23   Medicare Observation Status Notification Given:  Yes    Briant Sites, RN 06/06/2015, 3:44 PM

## 2015-06-06 NOTE — Progress Notes (Signed)
PROGRESS NOTE    Jody [name]  [id number] DOB: Aug 09, 1940 DOA: 06/05/2015 PCP: Monico Blitz, MD  Outpatient Specialists:     Brief Narrative:  75 year old female with a history of COPD and chronic diastolic congestive heart failure presents to the hospital with complaints of shortness of breath. She was found to have a possible COPD exacerbation as well as decompensated CHF. She was started on steroids, bronchodilators as well as IV Lasix. The patient also has chronic kidney disease and anemia of chronic disease. She had a slow decline in her hemoglobin without any evidence of bleeding. She was transfused 2 units PRBC on 4/23. Her overall respiratory status has improved with diuresis. Anticipate discharge home tomorrow if improved.   Assessment & Plan:   Active Problems:   Essential hypertension   Hyperlipidemia   Type 2 diabetes mellitus with other circulatory complications (HCC)   COPD (chronic obstructive pulmonary disease) (HCC)   CKD stage 4 due to type 2 diabetes mellitus (HCC)   Anemia in chronic kidney disease (CODE) (HCC)   Acute on chronic diastolic CHF (congestive heart failure) (HCC)   Atrial fibrillation (HCC)   CHF exacerbation (HCC)   Acute on chronic diastolic congestive heart failure. Patient was started on intravenous Lasix. She has had good urine output. Respiratory status has improved. Will continue IV Lasix for today and likely transition to oral Lasix tomorrow.  COPD exacerbation. Currently on steroids, nebulizer treatments and antibiotics. Wheezing has improved. Continue current treatments for now.  CKD stage IV. Creatinine is trending up with diuretics. Continue to monitor. She has good urine output.  Anemia of chronic kidney disease. Evidence of bleeding at this time. Since the patient is short of breath on exertion and has evidence of decompensated heart failure, will transfuse 2 units of PRBCs.  Hypertension. Stable. Continue current  treatments.  Diabetes. Appears to be stable. Continue basal insulin and sliding scale  History of atrial fibrillation. Continue on diltiazem. She was previously on Eliquis but reports that her cardiologist had recently taken her off of this. She is unsure exactly why. We'll continue on aspirin for now.   DVT prophylaxis: heparin Code Status: full code Family Communication: discussed with patient Disposition Plan: discharge home once improved   Consultants:     Procedures:  Echo: - Left ventricle: The cavity size was normal. There was mild  concentric hypertrophy. Systolic function was vigorous. The  estimated ejection fraction was in the range of 65% to 70%.  Features are consistent with a pseudonormal left ventricular  filling pattern, with concomitant abnormal relaxation and  increased filling pressure (grade 2 diastolic dysfunction).  Doppler parameters are consistent with elevated ventricular  end-diastolic filling pressure. - Aortic valve: Structurally normal valve. There was no  regurgitation. - Mitral valve: Structurally normal valve. There was mild  regurgitation. - Left atrium: The atrium was moderately dilated. - Right ventricle: The cavity size was normal. Wall thickness was  normal. Systolic function was normal. - Tricuspid valve: There was mild regurgitation. - Pulmonary arteries: Systolic pressure was mildly to moderately  increased. PA peak pressure: 45 mm Hg (S). - Inferior vena cava: The vessel was dilated. The respirophasic  diameter changes were blunted (< 50%), consistent with elevated  central venous pressure. - Pericardium, extracardiac: The pericardium was normal in   appearance.  Transfusion of 2 unit prbc 4/23  Antimicrobials:   Azithromycin 4/22>>   Subjective: Overall she is feeling better today. Shortness of breath is improving.  Objective: Filed Vitals:   06/06/15  1544 06/06/15 1603 06/06/15 1809 06/06/15 1830  BP:  143/57 149/64 147/65 157/64  Pulse: 73 68 68 68  Temp: 98.4 F (36.9 C) 99 F (37.2 C) 98.5 F (36.9 C) 98.3 F (36.8 C)  TempSrc: Oral Oral Oral Oral  Resp: 20 20 18 18   Height:      Weight:      SpO2: 100% 99% 96% 99%    Intake/Output Summary (Last 24 hours) at 06/06/15 1906 Last data filed at 06/06/15 1841  Gross per 24 hour  Intake   2020 ml  Output   4900 ml  Net  -2880 ml   Filed Weights   06/05/15 0510 06/06/15 0447  Weight: 92.08 kg (203 lb) 89.721 kg (197 lb 12.8 oz)    Examination:  General exam: Appears calm and comfortable  Respiratory system: Clear to auscultation. Respiratory effort normal. Cardiovascular system: S1 & S2 heard, RRR. No JVD, murmurs, rubs, gallops or clicks. trace pedal edema. Gastrointestinal system: Abdomen is nondistended, soft and nontender. No organomegaly or masses felt. Normal bowel sounds heard. Central nervous system: Alert and oriented. No focal neurological deficits. Extremities: Symmetric 5 x 5 power. Skin: No rashes, lesions or ulcers Psychiatry: Judgement and insight appear normal. Mood & affect appropriate.     Data Reviewed: I have personally reviewed following labs and imaging studies  CBC:  Recent Labs Lab 06/05/15 0548 06/06/15 0555  WBC 9.0 9.6  NEUTROABS 8.0*  --   HGB 7.2* 6.4*  HCT 22.2* 19.6*  MCV 91.7 90.7  PLT 179 [id number]   Basic Metabolic Panel:  Recent Labs Lab 06/05/15 0548 06/06/15 0555  NA 139 138  K 5.0 5.1  CL 111 109  CO2 18* 20*  GLUCOSE 241* 183*  BUN 52* 63*  CREATININE 2.47* 2.58*  CALCIUM 8.8* 9.0   GFR: Estimated Creatinine Clearance: 21 mL/min (by C-G formula based on Cr of 2.58). Liver Function Tests: No results for input(s): AST, ALT, ALKPHOS, BILITOT, PROT, ALBUMIN in the last 168 hours. No results for input(s): LIPASE, AMYLASE in the last 168 hours. No results for input(s): AMMONIA in the last 168 hours. Coagulation Profile: No results for input(s): INR, PROTIME in the  last 168 hours. Cardiac Enzymes:  Recent Labs Lab 06/05/15 0548 06/05/15 0921 06/05/15 1509 06/05/15 2046  TROPONINI 0.04* 0.05* 0.05* 0.05*   BNP (last 3 results) No results for input(s): PROBNP in the last 8760 hours. HbA1C: No results for input(s): HGBA1C in the last 72 hours. CBG:  Recent Labs Lab 06/05/15 1659 06/05/15 2103 06/06/15 0740 06/06/15 1116 06/06/15 1614  GLUCAP 269* 223* 152* 122* 231*   Lipid Profile: No results for input(s): CHOL, HDL, LDLCALC, TRIG, CHOLHDL, LDLDIRECT in the last 72 hours. Thyroid Function Tests: No results for input(s): TSH, T4TOTAL, FREET4, T3FREE, THYROIDAB in the last 72 hours. Anemia Panel: No results for input(s): VITAMINB12, FOLATE, FERRITIN, TIBC, IRON, RETICCTPCT in the last 72 hours. Urine analysis:    Component Value Date/Time   COLORURINE YELLOW 05/27/2014 1902   APPEARANCEUR CLEAR 05/27/2014 1902   LABSPEC 1.012 05/27/2014 1902   PHURINE 5.0 05/27/2014 1902   GLUCOSEU NEGATIVE 05/27/2014 1902   HGBUR NEGATIVE 05/27/2014 1902   BILIRUBINUR NEGATIVE 05/27/2014 1902   KETONESUR NEGATIVE 05/27/2014 1902   PROTEINUR 100* 05/27/2014 1902   UROBILINOGEN 0.2 05/27/2014 1902   NITRITE NEGATIVE 05/27/2014 1902   LEUKOCYTESUR NEGATIVE 05/27/2014 1902   Sepsis Labs: @LABRCNTIP (procalcitonin:4,lacticidven:4)  )No results found for this or any previous visit (from the past  240 hour(s)).       Radiology Studies: Dg Chest Portable 1 View  06/05/2015  CLINICAL DATA:  Initial valuation for acute shortness of breath. EXAM: PORTABLE CHEST 1 VIEW COMPARISON:  Prior study from 12/07/2014. FINDINGS: Cardiomegaly is unchanged. Mediastinal silhouette within normal limits. Tortuosity the intrathoracic aorta noted. Lungs are mildly hypoinflated. Diffuse pulmonary vascular congestion with indistinctness of the interstitial markings, consistent with pulmonary edema. Possible small bilateral pleural effusions. No definite focal  infiltrates. No pneumothorax. Pleural calcification at the left upper lobe noted, stable. No acute osseus abnormality. IMPRESSION: 1. Cardiomegaly with moderate diffuse pulmonary edema. 2. Probable small bilateral pleural effusions. Electronically Signed   By: Jeannine Boga M.D.   On: 06/05/2015 06:21        Scheduled Meds: . aspirin EC  81 mg Oral Daily  . atorvastatin  40 mg Oral q1800  . azithromycin  250 mg Oral Daily  . clopidogrel  75 mg Oral Daily  . diltiazem  120 mg Oral Daily  . [START ON 06/07/2015] furosemide  40 mg Oral Daily  . gabapentin  100 mg Oral TID  . guaiFENesin  1,200 mg Oral BID  . heparin subcutaneous  5,000 Units Subcutaneous Q8H  . insulin aspart  0-15 Units Subcutaneous TID WC  . insulin aspart  0-5 Units Subcutaneous QHS  . insulin detemir  5 Units Subcutaneous Daily  . ipratropium-albuterol  3 mL Nebulization TID  . mometasone-formoterol  2 puff Inhalation BID  . predniSONE  60 mg Oral Q breakfast  . sodium chloride flush  3 mL Intravenous Q12H   Continuous Infusions:       Time spent: 31mins    MEMON,JEHANZEB, MD [facility] Pager [phone]  If 7PM-7AM, please contact night-coverage [email or web address] Password [facility] 06/06/2015, 7:06 PM

## 2015-06-07 DIAGNOSIS — J449 Chronic obstructive pulmonary disease, unspecified: Secondary | ICD-10-CM | POA: Insufficient documentation

## 2015-06-07 DIAGNOSIS — J441 Chronic obstructive pulmonary disease with (acute) exacerbation: Secondary | ICD-10-CM | POA: Insufficient documentation

## 2015-06-07 LAB — BASIC METABOLIC PANEL
Anion gap: 13 (ref 5–15)
BUN: 79 mg/dL — AB (ref 6–20)
CALCIUM: 8.9 mg/dL (ref 8.9–10.3)
CO2: 20 mmol/L — ABNORMAL LOW (ref 22–32)
CREATININE: 2.81 mg/dL — AB (ref 0.44–1.00)
Chloride: 105 mmol/L (ref 101–111)
GFR, EST AFRICAN AMERICAN: 18 mL/min — AB (ref >60)
GFR, EST NON AFRICAN AMERICAN: 15 mL/min — AB (ref >60)
Glucose, Bld: 250 mg/dL — ABNORMAL HIGH (ref 65–99)
Potassium: 4.5 mmol/L (ref 3.5–5.1)
SODIUM: 138 mmol/L (ref 135–145)

## 2015-06-07 LAB — TYPE AND SCREEN
ABO/RH(D): O POS
ANTIBODY SCREEN: NEGATIVE
UNIT DIVISION: 0
UNIT DIVISION: 0

## 2015-06-07 LAB — GLUCOSE, CAPILLARY
GLUCOSE-CAPILLARY: 197 mg/dL — AB (ref 65–99)
Glucose-Capillary: 154 mg/dL — ABNORMAL HIGH (ref 65–99)

## 2015-06-07 LAB — CBC
HCT: 26.7 % — ABNORMAL LOW (ref 36.0–46.0)
HEMOGLOBIN: 9 g/dL — AB (ref 12.0–15.0)
MCH: 30.2 pg (ref 26.0–34.0)
MCHC: 33.7 g/dL (ref 30.0–36.0)
MCV: 89.6 fL (ref 78.0–100.0)
Platelets: 205 10*3/uL (ref 150–400)
RBC: 2.98 MIL/uL — ABNORMAL LOW (ref 3.87–5.11)
RDW: 16.3 % — ABNORMAL HIGH (ref 11.5–15.5)
WBC: 11.8 10*3/uL — ABNORMAL HIGH (ref 4.0–10.5)

## 2015-06-07 MED ORDER — GUAIFENESIN ER 600 MG PO TB12: 1200.0000 mg | ORAL_TABLET | Freq: Two times a day (BID) | ORAL | Status: DC

## 2015-06-07 MED ORDER — FUROSEMIDE 40 MG PO TABS: 40.0000 mg | ORAL_TABLET | Freq: Every day | ORAL | Status: DC

## 2015-06-07 MED ORDER — ASPIRIN 81 MG PO TBEC: 81.0000 mg | DELAYED_RELEASE_TABLET | Freq: Every day | ORAL | Status: DC

## 2015-06-07 MED ORDER — PREDNISONE 10 MG PO TABS: ORAL_TABLET | ORAL | Status: DC

## 2015-06-07 MED ORDER — AZITHROMYCIN 250 MG PO TABS: 250.0000 mg | ORAL_TABLET | Freq: Every day | ORAL | Status: DC

## 2015-06-07 NOTE — Care Management Important Message (Signed)
Important Message  Patient Details  [name] MRN: [id number] Date of Birth: April 04, 1940   Medicare Important Message Given:  Yes    Sherald Barge, RN 06/07/2015, 11:13 AM

## 2015-06-07 NOTE — Care Management Note (Signed)
Case Management Note  Patient Details  Name: Sumaira Surridge MRN: [id number] Date of Birth: 1940/10/04  Subjective/Objective:                  Pt is from home, lives alone and has a niece who provides support. Pt uses a cane and walker as needed. Pt says she lives on $900 a month and feels she qualifies for Medicaid but can not get to DSS to apply. FC has been referred to speak with pt about what services she qualifies for. Pt has PCP, gets to appointments via her niece, and has no difficulty affording her medications. Pt is not active with [facility] services PTA. Pt says she receives Meal on Wheels. Pt encouraged to call Pt has been ordered [facility] nursing services, pt has chosen [facility] from [facility] agency list. Pt understands HH has 48 hours to initiate services.   Action/Plan: Pt discharging home today with [facility] services through [facility]. Blake Divine, of [facility], made aware of referral and will obtain pt info from chart.   Expected Discharge Date:  06/08/15               Expected Discharge Plan:  [place]  In-House Referral:  Financial Counselor  Discharge planning Services  CM Consult  Post Acute Care Choice:  NA Choice offered to:  NA  DME Arranged:    DME Agency:     HH Arranged:    [place] Agency:     Status of Service:  Completed, signed off  Medicare Important Message Given:  Yes Date Medicare IM Given:    Medicare IM give by:    Date Additional Medicare IM Given:    Additional Medicare Important Message give by:     If discussed at [place] of Stay Meetings, dates discussed:    Additional Comments:  Sherald Barge, RN 06/07/2015, 11:14 AM

## 2015-06-07 NOTE — Progress Notes (Signed)
Discharge instructions read to patient.  Pt verbalized understanding of all instructions. Discharged to home with family

## 2015-07-18 ENCOUNTER — Emergency Department (HOSPITAL_COMMUNITY)
Admission: EM | Admit: 2015-07-18 | Discharge: 2015-07-18 | Disposition: A | Discharge status: Home / Self Care | Payer: Medicare HMO | Source: Home / Self Care | Attending: Emergency Medicine | Admitting: Emergency Medicine

## 2015-07-18 ENCOUNTER — Encounter (HOSPITAL_COMMUNITY): Payer: Self-pay | Admitting: Emergency Medicine

## 2015-07-18 ENCOUNTER — Emergency Department (HOSPITAL_COMMUNITY): Payer: Medicare HMO

## 2015-07-18 DIAGNOSIS — I5022 Chronic systolic (congestive) heart failure: Secondary | ICD-10-CM | POA: Insufficient documentation

## 2015-07-18 DIAGNOSIS — E119 Type 2 diabetes mellitus without complications: Secondary | ICD-10-CM

## 2015-07-18 DIAGNOSIS — Z79899 Other long term (current) drug therapy: Secondary | ICD-10-CM | POA: Insufficient documentation

## 2015-07-18 DIAGNOSIS — Z794 Long term (current) use of insulin: Secondary | ICD-10-CM | POA: Insufficient documentation

## 2015-07-18 DIAGNOSIS — I5020 Unspecified systolic (congestive) heart failure: Secondary | ICD-10-CM

## 2015-07-18 DIAGNOSIS — Z87891 Personal history of nicotine dependence: Secondary | ICD-10-CM

## 2015-07-18 DIAGNOSIS — Z8673 Personal history of transient ischemic attack (TIA), and cerebral infarction without residual deficits: Secondary | ICD-10-CM

## 2015-07-18 DIAGNOSIS — I110 Hypertensive heart disease with heart failure: Secondary | ICD-10-CM

## 2015-07-18 DIAGNOSIS — J449 Chronic obstructive pulmonary disease, unspecified: Secondary | ICD-10-CM | POA: Insufficient documentation

## 2015-07-18 DIAGNOSIS — I130 Hypertensive heart and chronic kidney disease with heart failure and stage 1 through stage 4 chronic kidney disease, or unspecified chronic kidney disease: Secondary | ICD-10-CM | POA: Diagnosis not present

## 2015-07-18 DIAGNOSIS — M1990 Unspecified osteoarthritis, unspecified site: Secondary | ICD-10-CM

## 2015-07-18 DIAGNOSIS — R0602 Shortness of breath: Secondary | ICD-10-CM | POA: Diagnosis not present

## 2015-07-18 DIAGNOSIS — Z7982 Long term (current) use of aspirin: Secondary | ICD-10-CM

## 2015-07-18 LAB — COMPREHENSIVE METABOLIC PANEL
ALK PHOS: 164 U/L — AB (ref 38–126)
ALT: 218 U/L — AB (ref 14–54)
AST: 101 U/L — ABNORMAL HIGH (ref 15–41)
Albumin: 3.3 g/dL — ABNORMAL LOW (ref 3.5–5.0)
Anion gap: 7 (ref 5–15)
BUN: 62 mg/dL — ABNORMAL HIGH (ref 6–20)
CO2: 20 mmol/L — AB (ref 22–32)
Calcium: 8.2 mg/dL — ABNORMAL LOW (ref 8.9–10.3)
Chloride: 113 mmol/L — ABNORMAL HIGH (ref 101–111)
Creatinine, Ser: 2.45 mg/dL — ABNORMAL HIGH (ref 0.44–1.00)
GFR calc non Af Amer: 18 mL/min — ABNORMAL LOW (ref >60)
GFR, EST AFRICAN AMERICAN: 21 mL/min — AB (ref >60)
GLUCOSE: 115 mg/dL — AB (ref 65–99)
Potassium: 4.5 mmol/L (ref 3.5–5.1)
SODIUM: 140 mmol/L (ref 135–145)
Total Bilirubin: 0.4 mg/dL (ref 0.3–1.2)
Total Protein: 6.1 g/dL — ABNORMAL LOW (ref 6.5–8.1)

## 2015-07-18 LAB — CBC
HCT: 25.7 % — ABNORMAL LOW (ref 36.0–46.0)
HEMOGLOBIN: 8.1 g/dL — AB (ref 12.0–15.0)
MCH: 30.3 pg (ref 26.0–34.0)
MCHC: 31.5 g/dL (ref 30.0–36.0)
MCV: 96.3 fL (ref 78.0–100.0)
Platelets: 164 10*3/uL (ref 150–400)
RBC: 2.67 MIL/uL — AB (ref 3.87–5.11)
RDW: 15.7 % — ABNORMAL HIGH (ref 11.5–15.5)
WBC: 5.9 10*3/uL (ref 4.0–10.5)

## 2015-07-18 LAB — BRAIN NATRIURETIC PEPTIDE: B Natriuretic Peptide: 1056 pg/mL — ABNORMAL HIGH (ref 0.0–100.0)

## 2015-07-18 LAB — TROPONIN I: Troponin I: 0.05 ng/mL — ABNORMAL HIGH (ref <0.031)

## 2015-07-18 NOTE — Discharge Instructions (Signed)
Take 1 Lasix (furosemide) twice a day, for 3 days. Rest as much as possible. Return here if needed, for problems.   Heart Failure Heart failure is a condition in which the heart has trouble pumping blood. This means your heart does not pump blood efficiently for your body to work well. In some cases of heart failure, fluid may back up into your lungs or you may have swelling (edema) in your lower legs. Heart failure is usually a long-term (chronic) condition. It is important for you to take good care of yourself and follow your health care provider's treatment plan. CAUSES  Some health conditions can cause heart failure. Those health conditions include:  High blood pressure (hypertension). Hypertension causes the heart muscle to work harder than normal. When pressure in the blood vessels is high, the heart needs to pump (contract) with more force in order to circulate blood throughout the body. High blood pressure eventually causes the heart to become stiff and weak.  Coronary artery disease (CAD). CAD is the buildup of cholesterol and fat (plaque) in the arteries of the heart. The blockage in the arteries deprives the heart muscle of oxygen and blood. This can cause chest pain and may lead to a heart attack. High blood pressure can also contribute to CAD.  Heart attack (myocardial infarction). A heart attack occurs when one or more arteries in the heart become blocked. The loss of oxygen damages the muscle tissue of the heart. When this happens, part of the heart muscle dies. The injured tissue does not contract as well and weakens the heart's ability to pump blood.  Abnormal heart valves. When the heart valves do not open and close properly, it can cause heart failure. This makes the heart muscle pump harder to keep the blood flowing.  Heart muscle disease (cardiomyopathy or myocarditis). Heart muscle disease is damage to the heart muscle from a variety of causes. These can include drug or  alcohol abuse, infections, or unknown reasons. These can increase the risk of heart failure.  Lung disease. Lung disease makes the heart work harder because the lungs do not work properly. This can cause a strain on the heart, leading it to fail.  Diabetes. Diabetes increases the risk of heart failure. High blood sugar contributes to high fat (lipid) levels in the blood. Diabetes can also cause slow damage to tiny blood vessels that carry important nutrients to the heart muscle. When the heart does not get enough oxygen and food, it can cause the heart to become weak and stiff. This leads to a heart that does not contract efficiently.  Other conditions can contribute to heart failure. These include abnormal heart rhythms, thyroid problems, and low blood counts (anemia). Certain unhealthy behaviors can increase the risk of heart failure, including:  Being overweight.  Smoking or chewing tobacco.  Eating foods high in fat and cholesterol.  Abusing illicit drugs or alcohol.  Lacking physical activity. SYMPTOMS  Heart failure symptoms may vary and can be hard to detect. Symptoms may include:  Shortness of breath with activity, such as climbing stairs.  Persistent cough.  Swelling of the feet, ankles, legs, or abdomen.  Unexplained weight gain.  Difficulty breathing when lying flat (orthopnea).  Waking from sleep because of the need to sit up and get more air.  Rapid heartbeat.  Fatigue and loss of energy.  Feeling light-headed, dizzy, or close to fainting.  Loss of appetite.  Nausea.  Increased urination during the night (nocturia). DIAGNOSIS  A  diagnosis of heart failure is based on your history, symptoms, physical examination, and diagnostic tests. Diagnostic tests for heart failure may include:  Echocardiography.  Electrocardiography.  Chest X-ray.  Blood tests.  Exercise stress test.  Cardiac angiography.  Radionuclide scans. TREATMENT  Treatment is aimed  at managing the symptoms of heart failure. Medicines, behavioral changes, or surgical intervention may be necessary to treat heart failure.  Medicines to help treat heart failure may include:  Angiotensin-converting enzyme (ACE) inhibitors. This type of medicine blocks the effects of a blood protein called angiotensin-converting enzyme. ACE inhibitors relax (dilate) the blood vessels and help lower blood pressure.  Angiotensin receptor blockers (ARBs). This type of medicine blocks the actions of a blood protein called angiotensin. Angiotensin receptor blockers dilate the blood vessels and help lower blood pressure.  Water pills (diuretics). Diuretics cause the kidneys to remove salt and water from the blood. The extra fluid is removed through urination. This loss of extra fluid lowers the volume of blood the heart pumps.  Beta blockers. These prevent the heart from beating too fast and improve heart muscle strength.  Digitalis. This increases the force of the heartbeat.  Healthy behavior changes include:  Obtaining and maintaining a healthy weight.  Stopping smoking or chewing tobacco.  Eating heart-healthy foods.  Limiting or avoiding alcohol.  Stopping illicit drug use.  Physical activity as directed by your health care provider.  Surgical treatment for heart failure may include:  A procedure to open blocked arteries, repair damaged heart valves, or remove damaged heart muscle tissue.  A pacemaker to improve heart muscle function and control certain abnormal heart rhythms.  An internal cardioverter defibrillator to treat certain serious abnormal heart rhythms.  A left ventricular assist device (LVAD) to assist the pumping ability of the heart. HOME CARE INSTRUCTIONS   Take medicines only as directed by your health care provider. Medicines are important in reducing the workload of your heart, slowing the progression of heart failure, and improving your symptoms.  Do not stop  taking your medicine unless directed by your health care provider.  Do not skip any dose of medicine.  Refill your prescriptions before you run out of medicine. Your medicines are needed every day.  Engage in moderate physical activity if directed by your health care provider. Moderate physical activity can benefit some people. The elderly and people with severe heart failure should consult with a health care provider for physical activity recommendations.  Eat heart-healthy foods. Food choices should be free of trans fat and low in saturated fat, cholesterol, and salt (sodium). Healthy choices include fresh or frozen fruits and vegetables, fish, lean meats, legumes, fat-free or low-fat dairy products, and whole grain or high fiber foods. Talk to a dietitian to learn more about heart-healthy foods.  Limit sodium if directed by your health care provider. Sodium restriction may reduce symptoms of heart failure in some people. Talk to a dietitian to learn more about heart-healthy seasonings.  Use healthy cooking methods. Healthy cooking methods include roasting, grilling, broiling, baking, poaching, steaming, or stir-frying. Talk to a dietitian to learn more about healthy cooking methods.  Limit fluids if directed by your health care provider. Fluid restriction may reduce symptoms of heart failure in some people.  Weigh yourself every day. Daily weights are important in the early recognition of excess fluid. You should weigh yourself every morning after you urinate and before you eat breakfast. Wear the same amount of clothing each time you weigh yourself. Record your daily  weight. Provide your health care provider with your weight record.  Monitor and record your blood pressure if directed by your health care provider.  Check your pulse if directed by your health care provider.  Lose weight if directed by your health care provider. Weight loss may reduce symptoms of heart failure in some  people.  Stop smoking or chewing tobacco. Nicotine makes your heart work harder by causing your blood vessels to constrict. Do not use nicotine gum or patches before talking to your health care provider.  Keep all follow-up visits as directed by your health care provider. This is important.  Limit alcohol intake to no more than 1 drink per day for nonpregnant women and 2 drinks per day for men. One drink equals 12 ounces of beer, 5 ounces of wine, or 1 ounces of hard liquor. Drinking more than that is harmful to your heart. Tell your health care provider if you drink alcohol several times a week. Talk with your health care provider about whether alcohol is safe for you. If your heart has already been damaged by alcohol or you have severe heart failure, drinking alcohol should be stopped completely.  Stop illicit drug use.  Stay up-to-date with immunizations. It is especially important to prevent respiratory infections through current pneumococcal and influenza immunizations.  Manage other health conditions such as hypertension, diabetes, thyroid disease, or abnormal heart rhythms as directed by your health care provider.  Learn to manage stress.  Plan rest periods when fatigued.  Learn strategies to manage high temperatures. If the weather is extremely hot:  Avoid vigorous physical activity.  Use air conditioning or fans or seek a cooler location.  Avoid caffeine and alcohol.  Wear loose-fitting, lightweight, and light-colored clothing.  Learn strategies to manage cold temperatures. If the weather is extremely cold:  Avoid vigorous physical activity.  Layer clothes.  Wear mittens or gloves, a hat, and a scarf when going outside.  Avoid alcohol.  Obtain ongoing education and support as needed.  Participate in or seek rehabilitation as needed to maintain or improve independence and quality of life. SEEK MEDICAL CARE IF:   You have a rapid weight gain.  You have increasing  shortness of breath that is unusual for you.  You are unable to participate in your usual physical activities.  You tire easily.  You cough more than normal, especially with physical activity.  You have any or more swelling in areas such as your hands, feet, ankles, or abdomen.  You are unable to sleep because it is hard to breathe.  You feel like your heart is beating fast (palpitations).  You become dizzy or light-headed upon standing up. SEEK IMMEDIATE MEDICAL CARE IF:   You have difficulty breathing.  There is a change in mental status such as decreased alertness or difficulty with concentration.  You have a pain or discomfort in your chest.  You have an episode of fainting (syncope). MAKE SURE YOU:   Understand these instructions.  Will watch your condition.  Will get help right away if you are not doing well or get worse.   This information is not intended to replace advice given to you by your health care provider. Make sure you discuss any questions you have with your health care provider.   Document Released: 01/30/2005 Document Revised: 06/16/2014 Document Reviewed: 03/01/2012 Elsevier Interactive Patient Education [employer].

## 2015-07-18 NOTE — ED Notes (Signed)
MD at bedside. 

## 2015-07-18 NOTE — ED Provider Notes (Signed)
CSN: [id number]     Arrival date & time 07/18/15  1424 History   First MD Initiated Contact with Patient 07/18/15 1440     Chief Complaint  Patient presents with  . Shortness of Breath     (Consider location/radiation/quality/duration/timing/severity/associated sxs/prior Treatment) HPI   She complains of progressively worse shortness of breath with dyspnea on exertion for several days. No change in chronic cough, ability to sleep at night, or fever and chills. She is not having any chest pain, nausea, vomiting, or difficulty tolerating her medications. There are no other known modifying factors.  Past Medical History  Diagnosis Date  . Hypertension   . Heart disease   . Gait difficulty 09/02/2014  . Hypercholesterolemia   . COPD (chronic obstructive pulmonary disease) ([place])   . Type II diabetes mellitus ([place])   . Chronic bronchitis ([place])     "get it q yr" (12/07/2014)  . Stroke ([place]) 05/2014    denies residual on 12/07/2014  . Arthritis     "bad in my legs" (12/07/2014)  . Chronic back pain     "dr said my spine is crooked"   Past Surgical History  Procedure Laterality Date  . Joint replacement    . Tumor excision  ~ 1970    "9# in my stomach"  . Appendectomy  ~ 1970  . Total knee arthroplasty Right ~ 1986  . Abdominal hysterectomy  ~ 1970   Family History  Problem Relation Age of Onset  . Cancer Mother   . Cancer Other    Social History  Substance Use Topics  . Smoking status: Former Smoker -- 1.00 packs/day for 50 years    Types: Cigarettes    Start date: 03/07/1953    Quit date: 01/13/2013  . Smokeless tobacco: Never Used     Comment: "quit smoking cigarettes  in ~ 2005"  . Alcohol Use: 0.0 oz/week    0 Standard drinks or equivalent per week     Comment: 10/242016 "quit drinking beer in ~ 1977"   OB History    No data available     Review of Systems  All other systems reviewed and are negative.     Allergies  Codeine  Home Medications   Prior to  Admission medications   Medication Sig Start Date End Date Taking? Authorizing Provider  albuterol (PROVENTIL HFA;VENTOLIN HFA) 108 (90 BASE) MCG/ACT inhaler Inhale 1-2 puffs into the lungs every 6 (six) hours as needed for wheezing or shortness of breath. Reported on 03/01/2015   Yes Historical Provider, MD  aspirin 81 MG EC tablet Take 1 tablet (81 mg total) by mouth daily. 06/07/15  Yes Kathie Dike, MD  atorvastatin (LIPITOR) 40 MG tablet Take 1 tablet by mouth daily. 06/02/15  Yes Historical Provider, MD  clopidogrel (PLAVIX) 75 MG tablet Take 75 mg by mouth daily.   Yes Historical Provider, MD  diltiazem (CARDIZEM CD) 120 MG 24 hr capsule Take 1 capsule (120 mg total) by mouth daily. 01/14/15  Yes Herminio Commons, MD  fluticasone-salmeterol (ADVAIR HFA) 230-21 MCG/ACT inhaler Inhale 2 puffs into the lungs 2 (two) times daily.   Yes Historical Provider, MD  furosemide (LASIX) 40 MG tablet Take 1 tablet (40 mg total) by mouth daily. 06/07/15  Yes Kathie Dike, MD  gabapentin (NEURONTIN) 100 MG capsule Take 100 mg by mouth 3 (three) times daily.   Yes Historical Provider, MD  guaiFENesin (MUCINEX) 600 MG 12 hr tablet Take 2 tablets (1,200 mg total) by  mouth 2 (two) times daily. 06/07/15  Yes Kathie Dike, MD  Insulin Degludec (TRESIBA FLEXTOUCH) 100 UNIT/ML SOPN Inject 15 Units into the skin daily.    Yes Historical Provider, MD  potassium chloride (K-DUR) 10 MEQ tablet Take 1 tablet by mouth daily. 06/04/15  Yes Historical Provider, MD  azithromycin (ZITHROMAX) 250 MG tablet Take 1 tablet (250 mg total) by mouth daily. 06/07/15   Kathie Dike, MD  predniSONE (DELTASONE) 10 MG tablet Take 40mg  po daily for 1 day then 30mg  daily for 1 day then 20mg  daily for 1 day then 10mg  daily for 1 day then stop 06/07/15   Kathie Dike, MD   BP 149/106 mmHg  Pulse 60  Temp(Src) 97.7 F (36.5 C) (Oral)  Resp 17  Ht 5\' 6"  (1.676 m)  Wt 198 lb (89.812 kg)  BMI 31.97 kg/m2  SpO2 100% Physical Exam   Constitutional: She is oriented to person, place, and time. She appears well-developed.  Elderly, obese  HENT:  Head: Normocephalic and atraumatic.  Right Ear: External ear normal.  Left Ear: External ear normal.  Eyes: Conjunctivae and EOM are normal. Pupils are equal, round, and reactive to light.  Neck: Normal range of motion and phonation normal. Neck supple.  Cardiovascular: Normal rate, regular rhythm and normal heart sounds.   Pulmonary/Chest: Effort normal. No respiratory distress. She has no wheezes. She has no rales. She exhibits no tenderness and no bony tenderness.  Somewhat diminished air movement at the bases bilaterally.  Abdominal: Soft. There is no tenderness.  Musculoskeletal: Normal range of motion. She exhibits edema.  Neurological: She is alert and oriented to person, place, and time. No cranial nerve deficit or sensory deficit. She exhibits normal muscle tone. Coordination normal.  Skin: Skin is warm, dry and intact.  Psychiatric: She has a normal mood and affect. Her behavior is normal. Judgment and thought content normal.  Nursing note and vitals reviewed.   ED Course  Procedures (including critical care time)  Medications - No data to display  Patient Vitals for the past 24 hrs:  BP Temp Temp src Pulse Resp SpO2 Height Weight  07/18/15 1600 - - - 60 17 100 % - -  07/18/15 1533 - - - 108 18 97 % - -  07/18/15 1432 (!) 149/106 mmHg 97.7 F (36.5 C) Oral 113 19 95 % 5\' 6"  (1.676 m) 198 lb (89.812 kg)    4:47 PM Reevaluation with update and discussion. After initial assessment and treatment, an updated evaluation reveals She is comfortable, now maintaining oxygen saturation 100% on room air. Findings discussed with patient and family member, all questions answered. Jody [name]    Labs Review Labs Reviewed  CBC - Abnormal; Notable for the following:    RBC 2.67 (*)    Hemoglobin 8.1 (*)    HCT 25.7 (*)    RDW 15.7 (*)    All other components within  normal limits  COMPREHENSIVE METABOLIC PANEL - Abnormal; Notable for the following:    Chloride 113 (*)    CO2 20 (*)    Glucose, Bld 115 (*)    BUN 62 (*)    Creatinine, Ser 2.45 (*)    Calcium 8.2 (*)    Total Protein 6.1 (*)    Albumin 3.3 (*)    AST 101 (*)    ALT 218 (*)    Alkaline Phosphatase 164 (*)    GFR calc non Af Amer 18 (*)    GFR calc Af Wyvonnia Lora  21 (*)    All other components within normal limits  TROPONIN I - Abnormal; Notable for the following:    Troponin I 0.05 (*)    All other components within normal limits  BRAIN NATRIURETIC PEPTIDE - Abnormal; Notable for the following:    B Natriuretic Peptide 1056.0 (*)    All other components within normal limits    Imaging Review Dg Chest 2 View  07/18/2015  CLINICAL DATA:  Shortness of breath for several days. EXAM: CHEST  2 VIEW COMPARISON:  06/05/2015 FINDINGS: Cardiomegaly with vascular congestion. No overt edema. No confluent opacities or effusions. No acute bony abnormality. IMPRESSION: Cardiomegaly, vascular congestion. Electronically Signed   By: Rolm Baptise M.D.   On: 07/18/2015 15:42   I have personally reviewed and evaluated these images and lab results as part of my medical decision-making.   EKG Interpretation   Date/Time:  Sunday July 18 2015 14:33:04 EDT Ventricular Rate:  104 PR Interval:    QRS Duration: 82 QT Interval:  333 QTC Calculation: 438 R Axis:   62 Text Interpretation:  Atrial fibrillation Anteroseptal infarct, old  Borderline repolarization abnormality Since last tracing Atrial  fibrillation is new with rapid rate Nonspecific T wave abnormality now  evident in inferior and lateral leads Confirmed by Inella Kuwahara  MD, Avira Tillison  [id number]) on 07/18/2015 2:40:47 PM      MDM   Final diagnoses:  Systolic congestive heart failure, unspecified congestive heart failure chronicity (HCC)    Mild fluid overloaded related to chronic congestive heart failure. No respiratory distress. No evidence for ACS,  metabolic instability or suggestion for impending vascular collapse.  Nursing Notes Reviewed/ Care Coordinated Applicable Imaging Reviewed Interpretation of Laboratory Data incorporated into ED treatment  The patient appears reasonably screened and/or stabilized for discharge and I doubt any other medical condition or other [facility] requiring further screening, evaluation, or treatment in the ED at this time prior to discharge.  Plan: Home Medications- increase Lasix to take 2 daily for 3 days, every 12 hours, then resume daily 40 mg tablet.; Home Treatments- rest; return here if the recommended treatment, does not improve the symptoms; Recommended follow up- follow up with nephrology in 2 days and PCP in 4 days as scheduled. Consider repeat blood testing at that time.     Daleen Bo, MD 07/18/15 [phone]

## 2015-07-18 NOTE — ED Notes (Signed)
Pt states she has been getting progressively short of breath with increased swelling.

## 2015-07-19 ENCOUNTER — Inpatient Hospital Stay (HOSPITAL_COMMUNITY)
Admission: EM | Admit: 2015-07-19 | Discharge: 2015-07-22 | DRG: 291 | Disposition: A | Discharge status: Home / Self Care | Payer: Medicare HMO | Attending: Family Medicine | Admitting: Family Medicine

## 2015-07-19 ENCOUNTER — Encounter (HOSPITAL_COMMUNITY): Payer: Self-pay | Admitting: Emergency Medicine

## 2015-07-19 ENCOUNTER — Emergency Department (HOSPITAL_COMMUNITY): Payer: Medicare HMO

## 2015-07-19 DIAGNOSIS — I10 Essential (primary) hypertension: Secondary | ICD-10-CM | POA: Diagnosis not present

## 2015-07-19 DIAGNOSIS — J449 Chronic obstructive pulmonary disease, unspecified: Secondary | ICD-10-CM | POA: Diagnosis present

## 2015-07-19 DIAGNOSIS — Z87891 Personal history of nicotine dependence: Secondary | ICD-10-CM

## 2015-07-19 DIAGNOSIS — Z7951 Long term (current) use of inhaled steroids: Secondary | ICD-10-CM

## 2015-07-19 DIAGNOSIS — E7800 Pure hypercholesterolemia, unspecified: Secondary | ICD-10-CM | POA: Diagnosis present

## 2015-07-19 DIAGNOSIS — I509 Heart failure, unspecified: Secondary | ICD-10-CM

## 2015-07-19 DIAGNOSIS — Z79899 Other long term (current) drug therapy: Secondary | ICD-10-CM | POA: Diagnosis not present

## 2015-07-19 DIAGNOSIS — Z794 Long term (current) use of insulin: Secondary | ICD-10-CM

## 2015-07-19 DIAGNOSIS — Z91128 Patient's intentional underdosing of medication regimen for other reason: Secondary | ICD-10-CM | POA: Diagnosis not present

## 2015-07-19 DIAGNOSIS — Z96651 Presence of right artificial knee joint: Secondary | ICD-10-CM | POA: Diagnosis present

## 2015-07-19 DIAGNOSIS — Z9114 Patient's other noncompliance with medication regimen: Secondary | ICD-10-CM | POA: Diagnosis not present

## 2015-07-19 DIAGNOSIS — D638 Anemia in other chronic diseases classified elsewhere: Secondary | ICD-10-CM | POA: Diagnosis present

## 2015-07-19 DIAGNOSIS — R0602 Shortness of breath: Secondary | ICD-10-CM | POA: Diagnosis present

## 2015-07-19 DIAGNOSIS — R740 Nonspecific elevation of levels of transaminase and lactic acid dehydrogenase [LDH]: Secondary | ICD-10-CM | POA: Diagnosis not present

## 2015-07-19 DIAGNOSIS — I5033 Acute on chronic diastolic (congestive) heart failure: Secondary | ICD-10-CM | POA: Diagnosis present

## 2015-07-19 DIAGNOSIS — N184 Chronic kidney disease, stage 4 (severe): Secondary | ICD-10-CM | POA: Diagnosis present

## 2015-07-19 DIAGNOSIS — E1159 Type 2 diabetes mellitus with other circulatory complications: Secondary | ICD-10-CM | POA: Diagnosis present

## 2015-07-19 DIAGNOSIS — Z90710 Acquired absence of both cervix and uterus: Secondary | ICD-10-CM

## 2015-07-19 DIAGNOSIS — I248 Other forms of acute ischemic heart disease: Secondary | ICD-10-CM | POA: Diagnosis present

## 2015-07-19 DIAGNOSIS — Z809 Family history of malignant neoplasm, unspecified: Secondary | ICD-10-CM | POA: Diagnosis not present

## 2015-07-19 DIAGNOSIS — I482 Chronic atrial fibrillation: Secondary | ICD-10-CM | POA: Diagnosis present

## 2015-07-19 DIAGNOSIS — I5031 Acute diastolic (congestive) heart failure: Secondary | ICD-10-CM | POA: Diagnosis present

## 2015-07-19 DIAGNOSIS — T45516A Underdosing of anticoagulants, initial encounter: Secondary | ICD-10-CM | POA: Diagnosis present

## 2015-07-19 DIAGNOSIS — I130 Hypertensive heart and chronic kidney disease with heart failure and stage 1 through stage 4 chronic kidney disease, or unspecified chronic kidney disease: Secondary | ICD-10-CM | POA: Diagnosis present

## 2015-07-19 DIAGNOSIS — Z8673 Personal history of transient ischemic attack (TIA), and cerebral infarction without residual deficits: Secondary | ICD-10-CM | POA: Diagnosis not present

## 2015-07-19 DIAGNOSIS — K5900 Constipation, unspecified: Secondary | ICD-10-CM | POA: Diagnosis present

## 2015-07-19 DIAGNOSIS — Z7982 Long term (current) use of aspirin: Secondary | ICD-10-CM | POA: Diagnosis not present

## 2015-07-19 DIAGNOSIS — E1122 Type 2 diabetes mellitus with diabetic chronic kidney disease: Secondary | ICD-10-CM | POA: Diagnosis present

## 2015-07-19 DIAGNOSIS — J438 Other emphysema: Secondary | ICD-10-CM | POA: Diagnosis not present

## 2015-07-19 DIAGNOSIS — I4891 Unspecified atrial fibrillation: Secondary | ICD-10-CM | POA: Diagnosis not present

## 2015-07-19 DIAGNOSIS — Z7901 Long term (current) use of anticoagulants: Secondary | ICD-10-CM

## 2015-07-19 LAB — BASIC METABOLIC PANEL
Anion gap: 7 (ref 5–15)
BUN: 59 mg/dL — AB (ref 6–20)
CALCIUM: 8.6 mg/dL — AB (ref 8.9–10.3)
CO2: 20 mmol/L — AB (ref 22–32)
Chloride: 112 mmol/L — ABNORMAL HIGH (ref 101–111)
Creatinine, Ser: 2.43 mg/dL — ABNORMAL HIGH (ref 0.44–1.00)
GFR calc Af Amer: 21 mL/min — ABNORMAL LOW (ref >60)
GFR, EST NON AFRICAN AMERICAN: 18 mL/min — AB (ref >60)
GLUCOSE: 102 mg/dL — AB (ref 65–99)
Potassium: 4.9 mmol/L (ref 3.5–5.1)
Sodium: 139 mmol/L (ref 135–145)

## 2015-07-19 LAB — CBC WITH DIFFERENTIAL/PLATELET
Basophils Absolute: 0 10*3/uL (ref 0.0–0.1)
Basophils Relative: 0 %
EOS PCT: 4 %
Eosinophils Absolute: 0.2 10*3/uL (ref 0.0–0.7)
HEMATOCRIT: 26 % — AB (ref 36.0–46.0)
Hemoglobin: 8.1 g/dL — ABNORMAL LOW (ref 12.0–15.0)
LYMPHS ABS: 0.8 10*3/uL (ref 0.7–4.0)
LYMPHS PCT: 14 %
MCH: 30.2 pg (ref 26.0–34.0)
MCHC: 31.2 g/dL (ref 30.0–36.0)
MCV: 97 fL (ref 78.0–100.0)
MONO ABS: 0.4 10*3/uL (ref 0.1–1.0)
MONOS PCT: 8 %
NEUTROS ABS: 4.1 10*3/uL (ref 1.7–7.7)
Neutrophils Relative %: 74 %
PLATELETS: 175 10*3/uL (ref 150–400)
RBC: 2.68 MIL/uL — ABNORMAL LOW (ref 3.87–5.11)
RDW: 15.7 % — AB (ref 11.5–15.5)
WBC: 5.5 10*3/uL (ref 4.0–10.5)

## 2015-07-19 LAB — GLUCOSE, CAPILLARY: GLUCOSE-CAPILLARY: 73 mg/dL (ref 65–99)

## 2015-07-19 LAB — TROPONIN I
Troponin I: 0.05 ng/mL — ABNORMAL HIGH (ref <0.031)
Troponin I: 0.06 ng/mL — ABNORMAL HIGH (ref <0.031)

## 2015-07-19 LAB — BRAIN NATRIURETIC PEPTIDE: B Natriuretic Peptide: 951 pg/mL — ABNORMAL HIGH (ref 0.0–100.0)

## 2015-07-19 MED ORDER — CLOPIDOGREL BISULFATE 75 MG PO TABS
75.0000 mg | ORAL_TABLET | Freq: Every day | ORAL | Status: DC
  Administered 2015-07-20 – 2015-07-22 (×3): 75 mg via ORAL
  Filled 2015-07-19 (×3): qty 1

## 2015-07-19 MED ORDER — ASPIRIN EC 81 MG PO TBEC
81.0000 mg | DELAYED_RELEASE_TABLET | Freq: Every day | ORAL | Status: DC
  Administered 2015-07-19 – 2015-07-22 (×4): 81 mg via ORAL
  Filled 2015-07-19 (×4): qty 1

## 2015-07-19 MED ORDER — HYDRALAZINE HCL 25 MG PO TABS: 25.0000 mg | ORAL_TABLET | Freq: Four times a day (QID) | ORAL | Status: DC | PRN

## 2015-07-19 MED ORDER — ATORVASTATIN CALCIUM 40 MG PO TABS
40.0000 mg | ORAL_TABLET | Freq: Every day | ORAL | Status: DC
  Administered 2015-07-20 – 2015-07-21 (×2): 40 mg via ORAL
  Filled 2015-07-19 (×2): qty 1

## 2015-07-19 MED ORDER — DILTIAZEM HCL 100 MG IV SOLR
5.0000 mg/h | INTRAVENOUS | Status: DC
  Administered 2015-07-19: 5 mg/h via INTRAVENOUS
  Filled 2015-07-19: qty 100

## 2015-07-19 MED ORDER — FUROSEMIDE 10 MG/ML IJ SOLN
40.0000 mg | INTRAMUSCULAR | Status: AC
  Administered 2015-07-19: 40 mg via INTRAVENOUS
  Filled 2015-07-19: qty 4

## 2015-07-19 MED ORDER — INSULIN DEGLUDEC 100 UNIT/ML ~~LOC~~ SOPN
15.0000 [IU] | PEN_INJECTOR | Freq: Every day | SUBCUTANEOUS | Status: DC
  Filled 2015-07-19 (×2): qty 3

## 2015-07-19 MED ORDER — MOMETASONE FURO-FORMOTEROL FUM 200-5 MCG/ACT IN AERO
2.0000 | INHALATION_SPRAY | Freq: Two times a day (BID) | RESPIRATORY_TRACT | Status: DC
  Administered 2015-07-20 – 2015-07-22 (×4): 2 via RESPIRATORY_TRACT
  Filled 2015-07-19: qty 8.8

## 2015-07-19 MED ORDER — GABAPENTIN 100 MG PO CAPS
100.0000 mg | ORAL_CAPSULE | Freq: Three times a day (TID) | ORAL | Status: DC
  Administered 2015-07-19 – 2015-07-22 (×9): 100 mg via ORAL
  Filled 2015-07-19 (×9): qty 1

## 2015-07-19 MED ORDER — GUAIFENESIN ER 600 MG PO TB12
1200.0000 mg | ORAL_TABLET | Freq: Two times a day (BID) | ORAL | Status: DC
  Administered 2015-07-19 – 2015-07-22 (×6): 1200 mg via ORAL
  Filled 2015-07-19 (×6): qty 2

## 2015-07-19 MED ORDER — INSULIN ASPART 100 UNIT/ML ~~LOC~~ SOLN
0.0000 [IU] | Freq: Three times a day (TID) | SUBCUTANEOUS | Status: DC
  Administered 2015-07-20: 3 [IU] via SUBCUTANEOUS
  Administered 2015-07-21: 1 [IU] via SUBCUTANEOUS
  Administered 2015-07-21: 2 [IU] via SUBCUTANEOUS
  Administered 2015-07-22 (×3): 1 [IU] via SUBCUTANEOUS

## 2015-07-19 MED ORDER — HEPARIN SODIUM (PORCINE) 5000 UNIT/ML IJ SOLN
5000.0000 [IU] | Freq: Three times a day (TID) | INTRAMUSCULAR | Status: DC
  Administered 2015-07-19 – 2015-07-22 (×8): 5000 [IU] via SUBCUTANEOUS
  Filled 2015-07-19 (×8): qty 1

## 2015-07-19 MED ORDER — DILTIAZEM LOAD VIA INFUSION
20.0000 mg | Freq: Once | INTRAVENOUS | Status: AC
  Administered 2015-07-19: 20 mg via INTRAVENOUS
  Filled 2015-07-19: qty 20

## 2015-07-19 MED ORDER — ONDANSETRON HCL 4 MG/2ML IJ SOLN: 4.0000 mg | Freq: Three times a day (TID) | INTRAMUSCULAR | Status: AC | PRN

## 2015-07-19 MED ORDER — FUROSEMIDE 10 MG/ML IJ SOLN
40.0000 mg | Freq: Two times a day (BID) | INTRAMUSCULAR | Status: DC
  Administered 2015-07-20 – 2015-07-21 (×3): 40 mg via INTRAVENOUS
  Filled 2015-07-19 (×3): qty 4

## 2015-07-19 NOTE — ED Notes (Signed)
Pt reports she was here yesterday for same complaint- and today has no improvement.  Hospitalist is at bedside currently

## 2015-07-19 NOTE — H&P (Signed)
TRH H&P   Patient Demographics:    Jody [name], is a 75 y.o. female  MRN: [id number]   DOB - [birth year]  Admit Date - 07/19/2015  Outpatient Primary MD for the patient is Monico Blitz, MD  Referring MD/NP/PA: Noemi Chapel  Patient coming from: Home  Chief Complaint  Patient presents with  . Shortness of Breath      HPI:    Jody [name]  is a 75 y.o. female, with  history of diastolic heart failure, diabetes mellitus, COPD who came to the hospital /Shortness of breath. Patient was seen yesterday in the hospital at that time she was discharged with instruction to take Lasix, but patient says that she continued to have worsening shortness of breath so she came to the hospital. She denies any chest pain. In the ED patient found to be in A. fib with RVR, mild elevation of troponin, elevated BNP. Patient started on IV Cardizem, and given 1 dose of IV Lasix.   Review of systems:    In addition to the HPI above,  No Fever-chills, No Headache, No changes with Vision or hearing, No problems swallowing food or Liquids,  No Abdominal pain, No Nausea or Vommitting, Bowel movements are regular, No Blood in stool or Urine, No dysuria, No new skin rashes or bruises, No new joints pains-aches,  No new weakness, tingling, numbness in any extremity,   A full 10 point Review of Systems was done, except as stated above, all other Review of Systems were negative.   With Past History of the following :    Past Medical History  Diagnosis Date  . Hypertension   . Heart disease   . Gait difficulty 09/02/2014  . Hypercholesterolemia   . COPD (chronic obstructive pulmonary disease) ([place])   . Type II diabetes mellitus ([place])   . Chronic bronchitis ([place])     "get it q yr" (12/07/2014)  . Stroke ([place]) 05/2014    denies residual on 12/07/2014  . Arthritis     "bad in my legs" (12/07/2014)  . Chronic back  pain     "dr said my spine is crooked"      Past Surgical History  Procedure Laterality Date  . Joint replacement    . Tumor excision  ~ 1970    "9# in my stomach"  . Appendectomy  ~ 1970  . Total knee arthroplasty Right ~ 1986  . Abdominal hysterectomy  ~ 1970      Social History:     Social History  Substance Use Topics  . Smoking status: Former Smoker -- 1.00 packs/day for 50 years    Types: Cigarettes    Start date: 03/07/1953    Quit date: 01/13/2013  . Smokeless tobacco: Never Used     Comment: "quit smoking cigarettes  in ~ 2005"  . Alcohol Use: 0.0 oz/week    0 Standard drinks or equivalent per week     Comment: 10/242016 "quit drinking beer in ~ 1977"     Lives -  At home  Mobility - no limitation of mobility     Family History :     Family History  Problem Relation Age of Onset  . Cancer Mother   . Cancer Other       Home Medications:   Prior to Admission medications   Medication Sig Start Date End Date Taking? Authorizing Provider  albuterol (PROVENTIL HFA;VENTOLIN HFA) 108 (90 BASE) MCG/ACT inhaler Inhale 1-2 puffs into the lungs every 6 (six) hours as needed for wheezing or shortness of breath. Reported on 03/01/2015   Yes Historical Provider, MD  aspirin 81 MG EC tablet Take 1 tablet (81 mg total) by mouth daily. 06/07/15  Yes Kathie Dike, MD  atorvastatin (LIPITOR) 40 MG tablet Take 1 tablet by mouth daily. 06/02/15  Yes Historical Provider, MD  clopidogrel (PLAVIX) 75 MG tablet Take 75 mg by mouth daily.   Yes Historical Provider, MD  diltiazem (CARDIZEM CD) 120 MG 24 hr capsule Take 1 capsule (120 mg total) by mouth daily. 01/14/15  Yes Herminio Commons, MD  fluticasone-salmeterol (ADVAIR HFA) 230-21 MCG/ACT inhaler Inhale 2 puffs into the lungs 2 (two) times daily.   Yes Historical Provider, MD  furosemide (LASIX) 40 MG tablet Take 1 tablet (40 mg total) by mouth daily. 06/07/15  Yes Kathie Dike, MD  gabapentin (NEURONTIN) 100 MG capsule  Take 100 mg by mouth 3 (three) times daily.   Yes Historical Provider, MD  guaiFENesin (MUCINEX) 600 MG 12 hr tablet Take 2 tablets (1,200 mg total) by mouth 2 (two) times daily. 06/07/15  Yes Kathie Dike, MD  Insulin Degludec (TRESIBA FLEXTOUCH) 100 UNIT/ML SOPN Inject 15 Units into the skin daily.    Yes Historical Provider, MD  potassium chloride (K-DUR) 10 MEQ tablet Take 1 tablet by mouth daily. 06/04/15  Yes Historical Provider, MD  predniSONE (DELTASONE) 10 MG tablet Take 40mg  po daily for 1 day then 30mg  daily for 1 day then 20mg  daily for 1 day then 10mg  daily for 1 day then stop 06/07/15  Yes Kathie Dike, MD  azithromycin (ZITHROMAX) 250 MG tablet Take 1 tablet (250 mg total) by mouth daily. Patient not taking: Reported on 07/19/2015 06/07/15   Kathie Dike, MD     Allergies:     Allergies  Allergen Reactions  . Codeine Nausea And Vomiting     Physical Exam:   Vitals  Blood pressure 150/119, pulse 68, temperature 98.2 F (36.8 C), temperature source Oral, resp. rate 22, height 5\' 7"  (1.702 m), weight 89.812 kg (198 lb), SpO2 100 %.   1. General African-American female lying in bed in NAD, cooperative with exam  2. Normal affect and insight, Not Suicidal or Homicidal, Awake Alert, Oriented X 3.  3. No F.N deficits, ALL C.Nerves Intact, Strength 5/5 all 4 extremities, Sensation intact all 4 extremities, Plantars down going.  4. Ears and Eyes appear Normal, Conjunctivae clear, PERRLA. Moist Oral Mucosa.  5. Supple Neck, No JVD, No cervical lymphadenopathy appriciated, No Carotid Bruits.  6. Symmetrical Chest wall movement, bibasilar crackles, bilateral 1+ pitting edema lower extremities  7. RRR, No Gallops, Rubs or Murmurs, No Parasternal Heave.  8. Positive Bowel Sounds, Abdomen Soft, No tenderness, No organomegaly appriciated,No rebound -guarding or rigidity.  9.  No Cyanosis, Normal Skin Turgor, No Skin Rash or Bruise.  10. Good muscle tone,  joints appear  normal , no effusions, Normal ROM.      Data Review:    CBC  Recent Labs Lab 07/18/15 1500  07/19/15 1637  WBC 5.9 5.5  HGB 8.1* 8.1*  HCT 25.7* 26.0*  PLT 164 175  MCV 96.3 97.0  MCH 30.3 30.2  MCHC 31.5 31.2  RDW 15.7* 15.7*  LYMPHSABS  --  0.8  MONOABS  --  0.4  EOSABS  --  0.2  BASOSABS  --  0.0   ------------------------------------------------------------------------------------------------------------------  Chemistries   Recent Labs Lab 07/18/15 1500 07/19/15 1637  NA 140 139  K 4.5 4.9  CL 113* 112*  CO2 20* 20*  GLUCOSE 115* 102*  BUN 62* 59*  CREATININE 2.45* 2.43*  CALCIUM 8.2* 8.6*  AST 101*  --   ALT 218*  --   ALKPHOS 164*  --   BILITOT 0.4  --    ------------------------------------------------------------------------------------------------------------------ -------------------------------------------------------------------------------------------------------------------  Cardiac Enzymes  Recent Labs Lab 07/18/15 1500 07/19/15 1637  TROPONINI 0.05* 0.05*   ------------------------------------------------------------------------------------------------------------------    Component Value Date/Time   BNP 951.0* 07/19/2015 1637     ---------------------------------------------------------------------------------------------------------------  Urinalysis    Component Value Date/Time   COLORURINE YELLOW 05/27/2014 1902   APPEARANCEUR CLEAR 05/27/2014 1902   LABSPEC 1.012 05/27/2014 1902   PHURINE 5.0 05/27/2014 1902   GLUCOSEU NEGATIVE 05/27/2014 1902   HGBUR NEGATIVE 05/27/2014 1902   BILIRUBINUR NEGATIVE 05/27/2014 1902   KETONESUR NEGATIVE 05/27/2014 1902   PROTEINUR 100* 05/27/2014 1902   UROBILINOGEN 0.2 05/27/2014 1902   NITRITE NEGATIVE 05/27/2014 1902   LEUKOCYTESUR NEGATIVE 05/27/2014 1902    ----------------------------------------------------------------------------------------------------------------    Imaging Results:    Dg Chest 2 View  07/19/2015  CLINICAL DATA:  Increasing shortness of Breath EXAM: CHEST  2 VIEW COMPARISON:  07/18/2015 FINDINGS: Cardiac shadow is mildly prominent but stable. The lungs are well aerated bilaterally. Mild vascular congestion is again seen without significant interstitial edema. No focal infiltrate or sizable effusion is noted. Degenerative changes of the thoracic spine are seen. IMPRESSION: Mild vascular congestion stable from the prior exam. No acute abnormality is noted. Electronically Signed   By: Inez Catalina M.D.   On: 07/19/2015 16:36   Dg Chest 2 View  07/18/2015  CLINICAL DATA:  Shortness of breath for several days. EXAM: CHEST  2 VIEW COMPARISON:  06/05/2015 FINDINGS: Cardiomegaly with vascular congestion. No overt edema. No confluent opacities or effusions. No acute bony abnormality. IMPRESSION: Cardiomegaly, vascular congestion. Electronically Signed   By: Rolm Baptise M.D.   On: 07/18/2015 15:42    My personal review of EKG: Rhythm Atrial fibrillation     Assessment & Plan:    Active Problems:   Essential hypertension   Type 2 diabetes mellitus with other circulatory complications (HCC)   Atrial fibrillation with rapid ventricular response (HCC)   CKD stage 4 due to type 2 diabetes mellitus (HCC)   Acute on chronic diastolic CHF (congestive heart failure) (HCC)   CHF (congestive heart failure) (HCC)   CHF (congestive heart failure), NYHA class IV ([place])     1. Acute on chronic diastolic heart failure- patient has grade 2 diastolic dysfunction, BNP elevated at 951.0, will continue Lasix 40 mg IV every 12. Follow strict intake and output. Cardiology consultation in a.m. 2. Atrial fibrillation with RVR- patient is not on anticoagulation, continue aspirin and Plavix. We'll start IV Cardizem infusion 3. Elevated troponin- has mild elevation of troponin 0.05, likely from demand ischemia. Follow serial troponin. EKG shows no ST changes 4. Diabetes  mellitus- start sliding scale insulin, Insulin Degludec. 5. Hypertension- continue Cardizem, start hydralazine 25 mg every 6 hours when necessary for pain  greater than 160/100. 6. CKD stage IV-  creatinine is 2.43, at baseline. Follow BMP in a.m. 7. Transaminitis- patient has mild elevation of AST ALT and alkaline phosphatase. ? Cause, she has no vomiting or abdominal pain. Follow LFTs in a.m. If elevated consider abdominal ultrasound    DVT Prophylaxis-   Heparin  AM Labs Ordered, also please review Full Orders  Family Communication: No family at bedside  Code Status:  Full code  Admission status: Inpatient   Time spent in minutes : 60 min   Jaeden Messer S M.D on 07/19/2015 at 8:13 PM  Between 7am to 7pm - Pager - [phone]. After 7pm go to [email or web address] - password [email or web address]  [facility] - Office  [phone]

## 2015-07-19 NOTE — Progress Notes (Signed)
Pt given healthy choice dinner and diet [place]. Pt states she has not eaten all day is why her sugar is low. Will continue to monitor.

## 2015-07-19 NOTE — ED Notes (Signed)
Pt increasing sob and ble swelling. Pt states she was seen in ED yesterday for same.

## 2015-07-19 NOTE — ED Notes (Signed)
Called pt for room with no response from waiting area

## 2015-07-19 NOTE — ED Provider Notes (Signed)
CSN: [id number]     Arrival date & time 07/19/15  1559 History   First MD Initiated Contact with Patient 07/19/15 1903     Chief Complaint  Patient presents with  . Shortness of Breath     (Consider location/radiation/quality/duration/timing/severity/associated sxs/prior Treatment) HPI Comments: The patient is a 75 year old female, she has a known history of congestive heart failure as well as diabetes and COPD. She presents to the hospital after being seen yesterday with shortness of breath with a small amount of fluid overload. She was instructed to take more Lasix which she did, she has been diuresing but still states that she feels very dyspneic on exertion as well as struggling with peripheral edema. The symptoms are persistent, nothing seems to make them better, they're worse with exertion, she has occasional coughing but no fevers. Her legs are bilaterally swollen. An echocardiogram performed several months ago showed a normal ejection fraction with grade 2 diastolic dysfunction. She has known atrial fibrillation, takes a baby aspirin, Plavix  Patient is a 75 y.o. female presenting with shortness of breath. The history is provided by the patient.  Shortness of Breath   Past Medical History  Diagnosis Date  . Hypertension   . Heart disease   . Gait difficulty 09/02/2014  . Hypercholesterolemia   . COPD (chronic obstructive pulmonary disease) ([place])   . Type II diabetes mellitus ([place])   . Chronic bronchitis ([place])     "get it q yr" (12/07/2014)  . Stroke ([place]) 05/2014    denies residual on 12/07/2014  . Arthritis     "bad in my legs" (12/07/2014)  . Chronic back pain     "dr said my spine is crooked"   Past Surgical History  Procedure Laterality Date  . Joint replacement    . Tumor excision  ~ 1970    "9# in my stomach"  . Appendectomy  ~ 1970  . Total knee arthroplasty Right ~ 1986  . Abdominal hysterectomy  ~ 1970   Family History  Problem Relation Age of Onset  . Cancer  Mother   . Cancer Other    Social History  Substance Use Topics  . Smoking status: Former Smoker -- 1.00 packs/day for 50 years    Types: Cigarettes    Start date: 03/07/1953    Quit date: 01/13/2013  . Smokeless tobacco: Never Used     Comment: "quit smoking cigarettes  in ~ 2005"  . Alcohol Use: 0.0 oz/week    0 Standard drinks or equivalent per week     Comment: 10/242016 "quit drinking beer in ~ 1977"   OB History    No data available     Review of Systems  Respiratory: Positive for shortness of breath.   All other systems reviewed and are negative.     Allergies  Codeine  Home Medications   Prior to Admission medications   Medication Sig Start Date End Date Taking? Authorizing Provider  albuterol (PROVENTIL HFA;VENTOLIN HFA) 108 (90 BASE) MCG/ACT inhaler Inhale 1-2 puffs into the lungs every 6 (six) hours as needed for wheezing or shortness of breath. Reported on 03/01/2015   Yes Historical Provider, MD  aspirin 81 MG EC tablet Take 1 tablet (81 mg total) by mouth daily. 06/07/15  Yes Kathie Dike, MD  atorvastatin (LIPITOR) 40 MG tablet Take 1 tablet by mouth daily. 06/02/15  Yes Historical Provider, MD  clopidogrel (PLAVIX) 75 MG tablet Take 75 mg by mouth daily.   Yes Historical Provider, MD  diltiazem (CARDIZEM CD) 120 MG 24 hr capsule Take 1 capsule (120 mg total) by mouth daily. 01/14/15  Yes Herminio Commons, MD  fluticasone-salmeterol (ADVAIR HFA) 230-21 MCG/ACT inhaler Inhale 2 puffs into the lungs 2 (two) times daily.   Yes Historical Provider, MD  furosemide (LASIX) 40 MG tablet Take 1 tablet (40 mg total) by mouth daily. 06/07/15  Yes Kathie Dike, MD  gabapentin (NEURONTIN) 100 MG capsule Take 100 mg by mouth 3 (three) times daily.   Yes Historical Provider, MD  guaiFENesin (MUCINEX) 600 MG 12 hr tablet Take 2 tablets (1,200 mg total) by mouth 2 (two) times daily. 06/07/15  Yes Kathie Dike, MD  Insulin Degludec (TRESIBA FLEXTOUCH) 100 UNIT/ML SOPN  Inject 15 Units into the skin daily.    Yes Historical Provider, MD  potassium chloride (K-DUR) 10 MEQ tablet Take 1 tablet by mouth daily. 06/04/15  Yes Historical Provider, MD  predniSONE (DELTASONE) 10 MG tablet Take 40mg  po daily for 1 day then 30mg  daily for 1 day then 20mg  daily for 1 day then 10mg  daily for 1 day then stop 06/07/15  Yes Kathie Dike, MD  azithromycin (ZITHROMAX) 250 MG tablet Take 1 tablet (250 mg total) by mouth daily. Patient not taking: Reported on 07/19/2015 06/07/15   Kathie Dike, MD   BP 134/119 mmHg  Pulse 104  Temp(Src) 98.2 F (36.8 C) (Oral)  Resp 17  Ht 5\' 7"  (1.702 m)  Wt 198 lb (89.812 kg)  BMI 31.00 kg/m2  SpO2 95% Physical Exam  Constitutional: She appears well-developed and well-nourished. No distress.  HENT:  Head: Normocephalic and atraumatic.  Mouth/Throat: Oropharynx is clear and moist. No oropharyngeal exudate.  Eyes: Conjunctivae and EOM are normal. Pupils are equal, round, and reactive to light. Right eye exhibits no discharge. Left eye exhibits no discharge. No scleral icterus.  Neck: Normal range of motion. Neck supple. No JVD present. No thyromegaly present.  Cardiovascular: Normal heart sounds and intact distal pulses.  Exam reveals no gallop and no friction rub.   No murmur heard. Atrial fibrillation with rapid ventricular rate  Pulmonary/Chest: Effort normal. No respiratory distress. She has no wheezes. She has rales.  Abdominal: Soft. Bowel sounds are normal. She exhibits no distension and no mass. There is no tenderness.  Musculoskeletal: Normal range of motion. She exhibits edema. She exhibits no tenderness.  Lymphadenopathy:    She has no cervical adenopathy.  Neurological: She is alert. Coordination normal.  Skin: Skin is warm and dry. No rash noted. No erythema.  Psychiatric: She has a normal mood and affect. Her behavior is normal.  Nursing note and vitals reviewed.   ED Course  Procedures (including critical care  time) Labs Review Labs Reviewed  CBC WITH DIFFERENTIAL/PLATELET - Abnormal; Notable for the following:    RBC 2.68 (*)    Hemoglobin 8.1 (*)    HCT 26.0 (*)    RDW 15.7 (*)    All other components within normal limits  BASIC METABOLIC PANEL - Abnormal; Notable for the following:    Chloride 112 (*)    CO2 20 (*)    Glucose, Bld 102 (*)    BUN 59 (*)    Creatinine, Ser 2.43 (*)    Calcium 8.6 (*)    GFR calc non Af Amer 18 (*)    GFR calc Af Amer 21 (*)    All other components within normal limits  BRAIN NATRIURETIC PEPTIDE - Abnormal; Notable for the following:    B  Natriuretic Peptide 951.0 (*)    All other components within normal limits  TROPONIN I - Abnormal; Notable for the following:    Troponin I 0.05 (*)    All other components within normal limits    Imaging Review Dg Chest 2 View  07/19/2015  CLINICAL DATA:  Increasing shortness of Breath EXAM: CHEST  2 VIEW COMPARISON:  07/18/2015 FINDINGS: Cardiac shadow is mildly prominent but stable. The lungs are well aerated bilaterally. Mild vascular congestion is again seen without significant interstitial edema. No focal infiltrate or sizable effusion is noted. Degenerative changes of the thoracic spine are seen. IMPRESSION: Mild vascular congestion stable from the prior exam. No acute abnormality is noted. Electronically Signed   By: Inez Catalina M.D.   On: 07/19/2015 16:36   Dg Chest 2 View  07/18/2015  CLINICAL DATA:  Shortness of breath for several days. EXAM: CHEST  2 VIEW COMPARISON:  06/05/2015 FINDINGS: Cardiomegaly with vascular congestion. No overt edema. No confluent opacities or effusions. No acute bony abnormality. IMPRESSION: Cardiomegaly, vascular congestion. Electronically Signed   By: Rolm Baptise M.D.   On: 07/18/2015 15:42   I have personally reviewed and evaluated these images and lab results as part of my medical decision-making.   EKG Interpretation   Date/Time:  Monday July 19 2015 16:13:34  EDT Ventricular Rate:  117 PR Interval:    QRS Duration: 90 QT Interval:  332 QTC Calculation: 463 R Axis:   15 Text Interpretation:  Atrial fibrillation with rapid ventricular response  Anteroseptal infarct , age undetermined Abnormal ECG Since last tracing  rate faster Confirmed by Meleni Delahunt  MD, [place] ([id number]) on 07/19/2015 5:16:51 PM      MDM   Final diagnoses:  Atrial fibrillation with rapid ventricular response (HCC)  Acute on chronic diastolic congestive heart failure ([place])    The patient does have subtle rales and has atrial fibrillation with a rapid ventricular rate with pulse was around 130. I will give her Cardizem, diuresis, I have reviewed her labs and she seems to be at baseline for her creatinine, blood counts and BNP. EKG shows atrial fibrillation, the patient likely needs to be admitted for both rate control and diuresis.  Cardizem drip D/w Dr. Darrick Meigs - he will admit Step down unit.  Medications  furosemide (LASIX) injection 40 mg (not administered)  diltiazem (CARDIZEM) 1 mg/mL load via infusion 20 mg (not administered)    And  diltiazem (CARDIZEM) 100 mg in dextrose 5 % 100 mL (1 mg/mL) infusion (not administered)     CRITICAL CARE Performed by: Johnna Acosta Total critical care time: 35 minutes Critical care time was exclusive of separately billable procedures and treating other patients. Critical care was necessary to treat or prevent imminent or life-threatening deterioration. Critical care was time spent personally by me on the following activities: development of treatment plan with patient and/or surrogate as well as nursing, discussions with consultants, evaluation of patient's response to treatment, examination of patient, obtaining history from patient or surrogate, ordering and performing treatments and interventions, ordering and review of laboratory studies, ordering and review of radiographic studies, pulse oximetry and re-evaluation of patient's  condition.     Noemi Chapel, MD 07/19/15 [phone]

## 2015-07-20 LAB — BASIC METABOLIC PANEL
ANION GAP: 5 (ref 5–15)
BUN: 61 mg/dL — AB (ref 6–20)
CALCIUM: 8.6 mg/dL — AB (ref 8.9–10.3)
CO2: 23 mmol/L (ref 22–32)
Chloride: 112 mmol/L — ABNORMAL HIGH (ref 101–111)
Creatinine, Ser: 2.36 mg/dL — ABNORMAL HIGH (ref 0.44–1.00)
GFR calc Af Amer: 22 mL/min — ABNORMAL LOW (ref >60)
GFR, EST NON AFRICAN AMERICAN: 19 mL/min — AB (ref >60)
GLUCOSE: 105 mg/dL — AB (ref 65–99)
Potassium: 4.9 mmol/L (ref 3.5–5.1)
Sodium: 140 mmol/L (ref 135–145)

## 2015-07-20 LAB — HEPATIC FUNCTION PANEL
ALK PHOS: 154 U/L — AB (ref 38–126)
ALT: 200 U/L — AB (ref 14–54)
AST: 84 U/L — ABNORMAL HIGH (ref 15–41)
Albumin: 3.4 g/dL — ABNORMAL LOW (ref 3.5–5.0)
BILIRUBIN DIRECT: 0.1 mg/dL (ref 0.1–0.5)
BILIRUBIN INDIRECT: 0.6 mg/dL (ref 0.3–0.9)
Total Bilirubin: 0.7 mg/dL (ref 0.3–1.2)
Total Protein: 6.2 g/dL — ABNORMAL LOW (ref 6.5–8.1)

## 2015-07-20 LAB — GLUCOSE, CAPILLARY
GLUCOSE-CAPILLARY: 103 mg/dL — AB (ref 65–99)
GLUCOSE-CAPILLARY: 170 mg/dL — AB (ref 65–99)
Glucose-Capillary: 108 mg/dL — ABNORMAL HIGH (ref 65–99)
Glucose-Capillary: 221 mg/dL — ABNORMAL HIGH (ref 65–99)

## 2015-07-20 LAB — TROPONIN I
Troponin I: 0.05 ng/mL — ABNORMAL HIGH (ref <0.031)
Troponin I: 0.05 ng/mL — ABNORMAL HIGH (ref <0.031)

## 2015-07-20 LAB — MRSA PCR SCREENING: MRSA by PCR: NEGATIVE

## 2015-07-20 MED ORDER — INSULIN GLARGINE 100 UNIT/ML ~~LOC~~ SOLN
15.0000 [IU] | Freq: Every day | SUBCUTANEOUS | Status: DC
  Administered 2015-07-20 – 2015-07-22 (×3): 15 [IU] via SUBCUTANEOUS
  Filled 2015-07-20 (×4): qty 0.15

## 2015-07-20 MED ORDER — ZOLPIDEM TARTRATE 5 MG PO TABS
5.0000 mg | ORAL_TABLET | Freq: Once | ORAL | Status: AC
  Administered 2015-07-20: 5 mg via ORAL
  Filled 2015-07-20: qty 1

## 2015-07-20 MED ORDER — DILTIAZEM HCL 30 MG PO TABS
30.0000 mg | ORAL_TABLET | Freq: Three times a day (TID) | ORAL | Status: DC
  Administered 2015-07-20 – 2015-07-21 (×3): 30 mg via ORAL
  Filled 2015-07-20 (×3): qty 1

## 2015-07-20 MED ORDER — OXYCODONE-ACETAMINOPHEN 5-325 MG PO TABS
1.0000 | ORAL_TABLET | Freq: Four times a day (QID) | ORAL | Status: DC | PRN
  Administered 2015-07-20 – 2015-07-22 (×5): 1 via ORAL
  Filled 2015-07-20 (×5): qty 1

## 2015-07-20 NOTE — Progress Notes (Signed)
PROGRESS NOTE    Jody [name]  [id number] DOB: 1941-01-26 DOA: 07/19/2015 PCP: Monico Blitz, MD     Brief Narrative:  75 year old woman admitted on 6/5 with complaints of shortness of breath. She is found to have acute on chronic diastolic CHF as well as atrial fibrillation with rapid ventricular response. Has been admitted for further evaluation and management.   Assessment & Plan:   Active Problems:   Essential hypertension   Type 2 diabetes mellitus with other circulatory complications (HCC)   Atrial fibrillation with rapid ventricular response (HCC)   CKD stage 4 due to type 2 diabetes mellitus (HCC)   Acute on chronic diastolic CHF (congestive heart failure) (HCC)   CHF (congestive heart failure) (HCC)   CHF (congestive heart failure), NYHA class IV (HCC)   Acute on chronic diastolic CHF -Echo [id number]: Left ventricle: The cavity size was normal. There was mild  concentric hypertrophy. Systolic function was vigorous. The  estimated ejection fraction was in the range of 65% to 70%.  Features are consistent with a pseudonormal left ventricular  filling pattern, with concomitant abnormal relaxation and  increased filling pressure (grade 2 diastolic dysfunction).  Doppler parameters are consistent with elevated ventricular  end-diastolic filling pressure. -Continue Lasix, she is 2.4 L negative since admission, continue to strive for negative fluid balance.  Atrial fibrillation with rapid ventricular response -Her rate is currently controlled in the 70s to 80s. -We'll start oral Cardizem with plan to start weaning IV Cardizem today. -She certainly meets criteria for anticoagulation as per CHADSVASC score, will need continued discussions as to risk/benefit.  Stage IV chronic kidney disease -Baseline creatinine is around 2.4, creatinine remains at baseline, suspect some worsening from diuresis.  Elevated troponin -Has remained flat, suspect related  with acute CHF  and chronic kidney disease more so than actual ischemia, do not anticipate further workup.   DVT prophylaxis: Subcutaneous heparin Code Status: Full code Family Communication: Patient only  Disposition Plan: potential transfer to floor today if able to wean off Cardizem drip   Consultants:   Cardiology pending  Procedures:   None  Antimicrobials:   None    Subjective: Feels much improved, complains of some epigastric pain status post eating  Objective: Filed Vitals:   07/20/15 0500 07/20/15 0600 07/20/15 0700 07/20/15 0848  BP: 114/72 151/98 152/99   Pulse:  68 66   Temp:    97 F (36.1 C)  TempSrc:    Oral  Resp: 12 14 14    Height:      Weight: 92.7 kg (204 lb 5.9 oz)     SpO2:  98% 100%     Intake/Output Summary (Last 24 hours) at 07/20/15 1102 Last data filed at 07/20/15 0900  Gross per 24 hour  Intake    240 ml  Output   2600 ml  Net  -2360 ml   Filed Weights   07/19/15 1611 07/19/15 2117 07/20/15 0500  Weight: 89.812 kg (198 lb) 93.5 kg (206 lb 2.1 oz) 92.7 kg (204 lb 5.9 oz)    Examination:  General exam: Alert, awake, oriented x 3 Respiratory system: Clear to auscultation. Respiratory effort normal. Cardiovascular system:Irregular rhythm, normal rate, no murmurs, rubs or gallops Gastrointestinal system: Abdomen is nondistended, soft and nontender. No organomegaly or masses felt. Normal bowel sounds heard. Central nervous system: Alert and oriented. No focal neurological deficits. Extremities: 2+ pitting edema bilaterally Skin: No rashes, lesions or ulcers Psychiatry: Judgement and insight appear normal. Mood & affect appropriate.  Data Reviewed: I have personally reviewed following labs and imaging studies  CBC:  Recent Labs Lab 07/18/15 1500 07/19/15 1637  WBC 5.9 5.5  NEUTROABS  --  4.1  HGB 8.1* 8.1*  HCT 25.7* 26.0*  MCV 96.3 97.0  PLT 164 0000000   Basic Metabolic Panel:  Recent Labs Lab 07/18/15 1500 07/19/15 1637  07/20/15 0304  NA 140 139 140  K 4.5 4.9 4.9  CL 113* 112* 112*  CO2 20* 20* 23  GLUCOSE 115* 102* 105*  BUN 62* 59* 61*  CREATININE 2.45* 2.43* 2.36*  CALCIUM 8.2* 8.6* 8.6*   GFR: Estimated Creatinine Clearance: 23.3 mL/min (by C-G formula based on Cr of 2.36). Liver Function Tests:  Recent Labs Lab 07/18/15 1500 07/20/15 0304  AST 101* 84*  ALT 218* 200*  ALKPHOS 164* 154*  BILITOT 0.4 0.7  PROT 6.1* 6.2*  ALBUMIN 3.3* 3.4*   No results for input(s): LIPASE, AMYLASE in the last 168 hours. No results for input(s): AMMONIA in the last 168 hours. Coagulation Profile: No results for input(s): INR, PROTIME in the last 168 hours. Cardiac Enzymes:  Recent Labs Lab 07/18/15 1500 07/19/15 1637 07/19/15 2116 07/20/15 0304 07/20/15 0938  TROPONINI 0.05* 0.05* 0.06* 0.05* 0.05*   BNP (last 3 results) No results for input(s): PROBNP in the last 8760 hours. HbA1C: No results for input(s): HGBA1C in the last 72 hours. CBG:  Recent Labs Lab 07/19/15 2202 07/20/15 0721  GLUCAP 73 108*   Lipid Profile: No results for input(s): CHOL, HDL, LDLCALC, TRIG, CHOLHDL, LDLDIRECT in the last 72 hours. Thyroid Function Tests: No results for input(s): TSH, T4TOTAL, FREET4, T3FREE, THYROIDAB in the last 72 hours. Anemia Panel: No results for input(s): VITAMINB12, FOLATE, FERRITIN, TIBC, IRON, RETICCTPCT in the last 72 hours. Urine analysis:    Component Value Date/Time   COLORURINE YELLOW 05/27/2014 1902   APPEARANCEUR CLEAR 05/27/2014 1902   LABSPEC 1.012 05/27/2014 1902   PHURINE 5.0 05/27/2014 1902   GLUCOSEU NEGATIVE 05/27/2014 1902   HGBUR NEGATIVE 05/27/2014 1902   BILIRUBINUR NEGATIVE 05/27/2014 1902   KETONESUR NEGATIVE 05/27/2014 1902   PROTEINUR 100* 05/27/2014 1902   UROBILINOGEN 0.2 05/27/2014 1902   NITRITE NEGATIVE 05/27/2014 1902   LEUKOCYTESUR NEGATIVE 05/27/2014 1902   Sepsis Labs: @LABRCNTIP (procalcitonin:4,lacticidven:4)  ) Recent Results (from  the past 240 hour(s))  MRSA PCR Screening     Status: None   Collection Time: 07/19/15 10:05 PM  Result Value Ref Range Status   MRSA by PCR NEGATIVE NEGATIVE Final    Comment:        The GeneXpert MRSA Assay (FDA approved for NASAL specimens only), is one component of a comprehensive MRSA colonization surveillance program. It is not intended to diagnose MRSA infection nor to guide or monitor treatment for MRSA infections.          Radiology Studies: Dg Chest 2 View  07/19/2015  CLINICAL DATA:  Increasing shortness of Breath EXAM: CHEST  2 VIEW COMPARISON:  07/18/2015 FINDINGS: Cardiac shadow is mildly prominent but stable. The lungs are well aerated bilaterally. Mild vascular congestion is again seen without significant interstitial edema. No focal infiltrate or sizable effusion is noted. Degenerative changes of the thoracic spine are seen. IMPRESSION: Mild vascular congestion stable from the prior exam. No acute abnormality is noted. Electronically Signed   By: Inez Catalina M.D.   On: 07/19/2015 16:36   Dg Chest 2 View  07/18/2015  CLINICAL DATA:  Shortness of breath for several days. EXAM:  CHEST  2 VIEW COMPARISON:  06/05/2015 FINDINGS: Cardiomegaly with vascular congestion. No overt edema. No confluent opacities or effusions. No acute bony abnormality. IMPRESSION: Cardiomegaly, vascular congestion. Electronically Signed   By: Rolm Baptise M.D.   On: 07/18/2015 15:42        Scheduled Meds: . aspirin EC  81 mg Oral Daily  . atorvastatin  40 mg Oral q1800  . clopidogrel  75 mg Oral Daily  . diltiazem  30 mg Oral Q8H  . furosemide  40 mg Intravenous Q12H  . gabapentin  100 mg Oral TID  . guaiFENesin  1,200 mg Oral BID  . heparin subcutaneous  5,000 Units Subcutaneous Q8H  . insulin aspart  0-9 Units Subcutaneous TID WC  . Insulin Degludec  15 Units Subcutaneous Daily  . mometasone-formoterol  2 puff Inhalation BID   Continuous Infusions: . diltiazem (CARDIZEM) infusion 5  mg/hr (07/19/15 2023)     LOS: 1 day    Time spent: 25 minutes. Greater than 50% of this time was spent in direct contact with the patient coordinating care.     Lelon Frohlich, MD [facility] Pager [phone]  If 7PM-7AM, please contact night-coverage [email or web address] Password [email or web address] 07/20/2015, 11:02 AM

## 2015-07-20 NOTE — Care Management Note (Signed)
Case Management Note  Patient Details  [name] MRN: [id number] Date of Birth: [birth year]  Subjective/Objective: Spoke with patient who is alert and oriented from home alone. Stated that her niece is POA and takes her to her appointments. Denies issues filling medications. Does not have home O2. Fixed income with only Social security check but stated that she makes too much to get medicaid.   Has had HH with [facility] before and would like to continue with this agency if needed at discharge.   Walks with walker or cane ,  Denies recent falls.  Needs PT eval.           Action/Plan:Home with [facility]   Expected Discharge Date:                  Expected Discharge Plan:  [place]  In-House Referral:     Discharge planning Services  CM Consult  Post Acute Care Choice:    Choice offered to:     DME Arranged:    DME Agency:  [place]:    [facility] Agency:  [place]  Status of Service:  In process, will continue to follow  Medicare Important Message Given:    Date Medicare IM Given:    Medicare IM give by:    Date Additional Medicare IM Given:    Additional Medicare Important Message give by:     If discussed at [place] of Stay Meetings, dates discussed:    Additional Comments:  Alvie Heidelberg, RN 07/20/2015, 12:34 PM

## 2015-07-21 DIAGNOSIS — R740 Nonspecific elevation of levels of transaminase and lactic acid dehydrogenase [LDH]: Secondary | ICD-10-CM

## 2015-07-21 DIAGNOSIS — I5033 Acute on chronic diastolic (congestive) heart failure: Secondary | ICD-10-CM

## 2015-07-21 DIAGNOSIS — I4891 Unspecified atrial fibrillation: Secondary | ICD-10-CM

## 2015-07-21 LAB — GLUCOSE, CAPILLARY
GLUCOSE-CAPILLARY: 158 mg/dL — AB (ref 65–99)
Glucose-Capillary: 115 mg/dL — ABNORMAL HIGH (ref 65–99)
Glucose-Capillary: 126 mg/dL — ABNORMAL HIGH (ref 65–99)
Glucose-Capillary: 150 mg/dL — ABNORMAL HIGH (ref 65–99)

## 2015-07-21 LAB — CBC
HCT: 27.4 % — ABNORMAL LOW (ref 36.0–46.0)
Hemoglobin: 8.7 g/dL — ABNORMAL LOW (ref 12.0–15.0)
MCH: 31.1 pg (ref 26.0–34.0)
MCHC: 31.8 g/dL (ref 30.0–36.0)
MCV: 97.9 fL (ref 78.0–100.0)
Platelets: 186 10*3/uL (ref 150–400)
RBC: 2.8 MIL/uL — AB (ref 3.87–5.11)
RDW: 15.8 % — ABNORMAL HIGH (ref 11.5–15.5)
WBC: 5.6 10*3/uL (ref 4.0–10.5)

## 2015-07-21 LAB — BASIC METABOLIC PANEL
Anion gap: 6 (ref 5–15)
BUN: 58 mg/dL — AB (ref 6–20)
CALCIUM: 8.8 mg/dL — AB (ref 8.9–10.3)
CO2: 25 mmol/L (ref 22–32)
CREATININE: 2.59 mg/dL — AB (ref 0.44–1.00)
Chloride: 107 mmol/L (ref 101–111)
GFR, EST AFRICAN AMERICAN: 19 mL/min — AB (ref >60)
GFR, EST NON AFRICAN AMERICAN: 17 mL/min — AB (ref >60)
Glucose, Bld: 127 mg/dL — ABNORMAL HIGH (ref 65–99)
Potassium: 4.9 mmol/L (ref 3.5–5.1)
SODIUM: 138 mmol/L (ref 135–145)

## 2015-07-21 LAB — HEMOGLOBIN A1C
HEMOGLOBIN A1C: 5.9 % — AB (ref 4.8–5.6)
Mean Plasma Glucose: 123 mg/dL

## 2015-07-21 MED ORDER — AMLODIPINE BESYLATE 5 MG PO TABS
5.0000 mg | ORAL_TABLET | Freq: Every day | ORAL | Status: DC
  Administered 2015-07-21 – 2015-07-22 (×2): 5 mg via ORAL
  Filled 2015-07-21 (×2): qty 1

## 2015-07-21 MED ORDER — FUROSEMIDE 10 MG/ML IJ SOLN
40.0000 mg | Freq: Every day | INTRAMUSCULAR | Status: DC
  Administered 2015-07-22: 40 mg via INTRAVENOUS
  Filled 2015-07-21: qty 4

## 2015-07-21 MED ORDER — DILTIAZEM HCL ER COATED BEADS 120 MG PO CP24
120.0000 mg | ORAL_CAPSULE | Freq: Every day | ORAL | Status: DC
  Administered 2015-07-21 – 2015-07-22 (×2): 120 mg via ORAL
  Filled 2015-07-21 (×2): qty 1

## 2015-07-21 NOTE — Consult Note (Signed)
Reason for Consult:CHF Referring Physician: Dr. Irene Limbo Cardiologist:Dr.Koneswaran Consulting Cardiologist: Dr. Jess Barters Jody [name] is an 75 y.o. female.  HPI: This is a 75 y.o. Female patient of Dr. Purvis Sheffield with hisory of Chronic diastoic CHF, CKD stge 4, prior CVA, CAF on diltiazem(no beta blockers b/c COPD), was on Eliquis but she said it was stopped sometime between Jan and April? Secondary to anemia. Hbg 6.4 in April due to chronic disease, usually in the 8.0 range. CHADSVASC=8. Abnormal nuclear stress test 11/2014 probable artifact based on normal regional wall motion. See full report below.  Last week patient ran out of all her meds including Lasix and diltiazem and missed at least one day of meds. Came to ER Sunday and felt to have mild CHF, BNP 1056. Sent home to take extra Lasix. Came back to hospital Monday with worsening shortness of breath and leg edema. Also in rapid afib.Says she is very careful about salt intake. Had some right sided chest pain into her back that was sharp and now is tender to touch, no chest tightness. Lives alone, but niece comes daily to giver her insulin.Troponins flat 0.5, 0.6, BNP 951, Hbg 8.1, Crt 2.43. Missed appt with renal yesterday. Has diuresed and now feeling better.   Past Medical History  Diagnosis Date  . Hypertension   . Heart disease   . Gait difficulty 09/02/2014  . Hypercholesterolemia   . COPD (chronic obstructive pulmonary disease) (HCC)   . Type II diabetes mellitus (HCC)   . Chronic bronchitis (HCC)     "get it q yr" (12/07/2014)  . Stroke (HCC) 05/2014    denies residual on 12/07/2014  . Arthritis     "bad in my legs" (12/07/2014)  . Chronic back pain     "dr said my spine is crooked"    Past Surgical History  Procedure Laterality Date  . Joint replacement    . Tumor excision  ~ 1970    "9# in my stomach"  . Appendectomy  ~ 1970  . Total knee arthroplasty Right ~ 1986  . Abdominal hysterectomy  ~ 1970    Family  History  Problem Relation Age of Onset  . Cancer Mother   . Cancer Other     Social History:  reports that she quit smoking about 2 years ago. Her smoking use included Cigarettes. She started smoking about 62 years ago. She has a 50 pack-year smoking history. She has never used smokeless tobacco. She reports that she drinks alcohol. She reports that she does not use illicit drugs.  Allergies:  Allergies  Allergen Reactions  . Codeine Nausea And Vomiting    Medications: Scheduled Meds: . aspirin EC  81 mg Oral Daily  . atorvastatin  40 mg Oral q1800  . clopidogrel  75 mg Oral Daily  . diltiazem  30 mg Oral Q8H  . furosemide  40 mg Intravenous Q12H  . gabapentin  100 mg Oral TID  . guaiFENesin  1,200 mg Oral BID  . heparin subcutaneous  5,000 Units Subcutaneous Q8H  . insulin aspart  0-9 Units Subcutaneous TID WC  . insulin glargine  15 Units Subcutaneous Daily  . mometasone-formoterol  2 puff Inhalation BID   Continuous Infusions: . diltiazem (CARDIZEM) infusion Stopped (07/20/15 1217)   PRN Meds:.hydrALAZINE, oxyCODONE-acetaminophen   Results for orders placed or performed during the hospital encounter of 07/19/15 (from the past 48 hour(s))  CBC with Differential     Status: Abnormal   Collection Time: 07/19/15  4:37  PM  Result Value Ref Range   WBC 5.5 4.0 - 10.5 K/uL   RBC 2.68 (L) 3.87 - 5.11 MIL/uL   Hemoglobin 8.1 (L) 12.0 - 15.0 g/dL   HCT 40.9 (L) 81.1 - 91.4 %   MCV 97.0 78.0 - 100.0 fL   MCH 30.2 26.0 - 34.0 pg   MCHC 31.2 30.0 - 36.0 g/dL   RDW 78.2 (H) 95.6 - 21.3 %   Platelets 175 150 - 400 K/uL   Neutrophils Relative % 74 %   Neutro Abs 4.1 1.7 - 7.7 K/uL   Lymphocytes Relative 14 %   Lymphs Abs 0.8 0.7 - 4.0 K/uL   Monocytes Relative 8 %   Monocytes Absolute 0.4 0.1 - 1.0 K/uL   Eosinophils Relative 4 %   Eosinophils Absolute 0.2 0.0 - 0.7 K/uL   Basophils Relative 0 %   Basophils Absolute 0.0 0.0 - 0.1 K/uL  Basic metabolic panel     Status:  Abnormal   Collection Time: 07/19/15  4:37 PM  Result Value Ref Range   Sodium 139 135 - 145 mmol/L   Potassium 4.9 3.5 - 5.1 mmol/L   Chloride 112 (H) 101 - 111 mmol/L   CO2 20 (L) 22 - 32 mmol/L   Glucose, Bld 102 (H) 65 - 99 mg/dL   BUN 59 (H) 6 - 20 mg/dL   Creatinine, Ser 0.86 (H) 0.44 - 1.00 mg/dL   Calcium 8.6 (L) 8.9 - 10.3 mg/dL   GFR calc non Af Amer 18 (L) >60 mL/min   GFR calc Af Amer 21 (L) >60 mL/min    Comment: (NOTE) The eGFR has been calculated using the CKD EPI equation. This calculation has not been validated in all clinical situations. eGFR's persistently <60 mL/min signify possible Chronic Kidney Disease.    Anion gap 7 5 - 15  Brain natriuretic peptide     Status: Abnormal   Collection Time: 07/19/15  4:37 PM  Result Value Ref Range   B Natriuretic Peptide 951.0 (H) 0.0 - 100.0 pg/mL  Troponin I     Status: Abnormal   Collection Time: 07/19/15  4:37 PM  Result Value Ref Range   Troponin I 0.05 (H) <0.031 ng/mL    Comment:        PERSISTENTLY INCREASED TROPONIN VALUES IN THE RANGE OF 0.04-0.49 ng/mL CAN BE SEEN IN:       -UNSTABLE ANGINA       -CONGESTIVE HEART FAILURE       -MYOCARDITIS       -CHEST TRAUMA       -ARRYHTHMIAS       -LATE PRESENTING MYOCARDIAL INFARCTION       -COPD   CLINICAL FOLLOW-UP RECOMMENDED.   Hemoglobin A1c     Status: Abnormal   Collection Time: 07/19/15  9:16 PM  Result Value Ref Range   Hgb A1c MFr Bld 5.9 (H) 4.8 - 5.6 %    Comment: (NOTE)         Pre-diabetes: 5.7 - 6.4         Diabetes: >6.4         Glycemic control for adults with diabetes: <7.0    Mean Plasma Glucose 123 mg/dL    Comment: (NOTE) Performed At: [facility] [address] [place], [place] [id number] Mila Homer MD [id number]   Troponin I (q 6hr x 3)     Status: Abnormal   Collection Time: 07/19/15  9:16 PM  Result Value Ref  Range   Troponin I 0.06 (H) <0.031 ng/mL    Comment:        PERSISTENTLY INCREASED TROPONIN VALUES  IN THE RANGE OF 0.04-0.49 ng/mL CAN BE SEEN IN:       -UNSTABLE ANGINA       -CONGESTIVE HEART FAILURE       -MYOCARDITIS       -CHEST TRAUMA       -ARRYHTHMIAS       -LATE PRESENTING MYOCARDIAL INFARCTION       -COPD   CLINICAL FOLLOW-UP RECOMMENDED.   Glucose, capillary     Status: None   Collection Time: 07/19/15 10:02 PM  Result Value Ref Range   Glucose-Capillary 73 65 - 99 mg/dL  MRSA PCR Screening     Status: None   Collection Time: 07/19/15 10:05 PM  Result Value Ref Range   MRSA by PCR NEGATIVE NEGATIVE    Comment:        The GeneXpert MRSA Assay (FDA approved for NASAL specimens only), is one component of a comprehensive MRSA colonization surveillance program. It is not intended to diagnose MRSA infection nor to guide or monitor treatment for MRSA infections.   Hepatic function panel     Status: Abnormal   Collection Time: 07/20/15  3:04 AM  Result Value Ref Range   Total Protein 6.2 (L) 6.5 - 8.1 g/dL   Albumin 3.4 (L) 3.5 - 5.0 g/dL   AST 84 (H) 15 - 41 U/L   ALT 200 (H) 14 - 54 U/L   Alkaline Phosphatase 154 (H) 38 - 126 U/L   Total Bilirubin 0.7 0.3 - 1.2 mg/dL   Bilirubin, Direct 0.1 0.1 - 0.5 mg/dL   Indirect Bilirubin 0.6 0.3 - 0.9 mg/dL  Basic metabolic panel     Status: Abnormal   Collection Time: 07/20/15  3:04 AM  Result Value Ref Range   Sodium 140 135 - 145 mmol/L   Potassium 4.9 3.5 - 5.1 mmol/L   Chloride 112 (H) 101 - 111 mmol/L   CO2 23 22 - 32 mmol/L   Glucose, Bld 105 (H) 65 - 99 mg/dL   BUN 61 (H) 6 - 20 mg/dL   Creatinine, Ser 2.36 (H) 0.44 - 1.00 mg/dL   Calcium 8.6 (L) 8.9 - 10.3 mg/dL   GFR calc non Af Amer 19 (L) >60 mL/min   GFR calc Af Amer 22 (L) >60 mL/min    Comment: (NOTE) The eGFR has been calculated using the CKD EPI equation. This calculation has not been validated in all clinical situations. eGFR's persistently <60 mL/min signify possible Chronic Kidney Disease.    Anion gap 5 5 - 15  Troponin I     Status:  Abnormal   Collection Time: 07/20/15  3:04 AM  Result Value Ref Range   Troponin I 0.05 (H) <0.031 ng/mL    Comment:        PERSISTENTLY INCREASED TROPONIN VALUES IN THE RANGE OF 0.04-0.49 ng/mL CAN BE SEEN IN:       -UNSTABLE ANGINA       -CONGESTIVE HEART FAILURE       -MYOCARDITIS       -CHEST TRAUMA       -ARRYHTHMIAS       -LATE PRESENTING MYOCARDIAL INFARCTION       -COPD   CLINICAL FOLLOW-UP RECOMMENDED.   Glucose, capillary     Status: Abnormal   Collection Time: 07/20/15  7:21 AM  Result Value Ref Range  Glucose-Capillary 108 (H) 65 - 99 mg/dL   Comment 1 Notify RN    Comment 2 Document in Chart   Troponin I (q 6hr x 3)     Status: Abnormal   Collection Time: 07/20/15  9:38 AM  Result Value Ref Range   Troponin I 0.05 (H) <0.031 ng/mL    Comment:        PERSISTENTLY INCREASED TROPONIN VALUES IN THE RANGE OF 0.04-0.49 ng/mL CAN BE SEEN IN:       -UNSTABLE ANGINA       -CONGESTIVE HEART FAILURE       -MYOCARDITIS       -CHEST TRAUMA       -ARRYHTHMIAS       -LATE PRESENTING MYOCARDIAL INFARCTION       -COPD   CLINICAL FOLLOW-UP RECOMMENDED.   Glucose, capillary     Status: Abnormal   Collection Time: 07/20/15 10:57 AM  Result Value Ref Range   Glucose-Capillary 221 (H) 65 - 99 mg/dL   Comment 1 Notify RN    Comment 2 Document in Chart   Glucose, capillary     Status: Abnormal   Collection Time: 07/20/15  4:29 PM  Result Value Ref Range   Glucose-Capillary 103 (H) 65 - 99 mg/dL   Comment 1 Notify RN    Comment 2 Document in Chart   Glucose, capillary     Status: Abnormal   Collection Time: 07/20/15  9:33 PM  Result Value Ref Range   Glucose-Capillary 170 (H) 65 - 99 mg/dL   Comment 1 Notify RN   Glucose, capillary     Status: Abnormal   Collection Time: 07/21/15  7:38 AM  Result Value Ref Range   Glucose-Capillary 115 (H) 65 - 99 mg/dL   Comment 1 Notify RN    Comment 2 Document in Chart   Basic metabolic panel     Status: Abnormal   Collection  Time: 07/21/15  7:44 AM  Result Value Ref Range   Sodium 138 135 - 145 mmol/L   Potassium 4.9 3.5 - 5.1 mmol/L   Chloride 107 101 - 111 mmol/L   CO2 25 22 - 32 mmol/L   Glucose, Bld 127 (H) 65 - 99 mg/dL   BUN 58 (H) 6 - 20 mg/dL   Creatinine, Ser 0.48 (H) 0.44 - 1.00 mg/dL   Calcium 8.8 (L) 8.9 - 10.3 mg/dL   GFR calc non Af Amer 17 (L) >60 mL/min   GFR calc Af Amer 19 (L) >60 mL/min    Comment: (NOTE) The eGFR has been calculated using the CKD EPI equation. This calculation has not been validated in all clinical situations. eGFR's persistently <60 mL/min signify possible Chronic Kidney Disease.    Anion gap 6 5 - 15  CBC     Status: Abnormal   Collection Time: 07/21/15  7:44 AM  Result Value Ref Range   WBC 5.6 4.0 - 10.5 K/uL   RBC 2.80 (L) 3.87 - 5.11 MIL/uL   Hemoglobin 8.7 (L) 12.0 - 15.0 g/dL   HCT 49.8 (L) 65.1 - 68.6 %   MCV 97.9 78.0 - 100.0 fL   MCH 31.1 26.0 - 34.0 pg   MCHC 31.8 30.0 - 36.0 g/dL   RDW 10.4 (H) 24.7 - 31.9 %   Platelets 186 150 - 400 K/uL    Dg Chest 2 View  07/19/2015  CLINICAL DATA:  Increasing shortness of Breath EXAM: CHEST  2 VIEW COMPARISON:  07/18/2015 FINDINGS: Cardiac  shadow is mildly prominent but stable. The lungs are well aerated bilaterally. Mild vascular congestion is again seen without significant interstitial edema. No focal infiltrate or sizable effusion is noted. Degenerative changes of the thoracic spine are seen. IMPRESSION: Mild vascular congestion stable from the prior exam. No acute abnormality is noted. Electronically Signed   By: Inez Catalina M.D.   On: 07/19/2015 16:36    Review of Systems  Constitutional: Positive for malaise/fatigue.  HENT: Positive for hearing loss and sore throat.   Eyes: Negative.   Respiratory: Positive for shortness of breath and wheezing.   Cardiovascular: Positive for chest pain and leg swelling.  Gastrointestinal: Positive for heartburn. Negative for blood in stool and melena.  Genitourinary:  Negative.   Musculoskeletal: Negative.   Neurological: Positive for weakness.  Endo/Heme/Allergies: Negative.   All other systems reviewed and are negative.  Blood pressure 115/77, pulse 71, temperature 98 F (36.7 C), temperature source Oral, resp. rate 8, height '5\' 7"'$  (1.702 m), weight 201 lb 4.5 oz (91.3 kg), SpO2 100 %. Physical Exam PHYSICAL EXAM: Well-nournished, in no acute distress. Neck:Increased JVD, HJR,no Bruit, or thyroid enlargement Lungs: Decreased breath sounds with bibasilar rales Cardiovascular: Irreg, PMI not displaced, heart sounds normal, no murmurs, gallops, bruit, thrill, or heave. Abdomen: BS normal. Soft without organomegaly, masses, lesions or tenderness. Extremities: without cyanosis, clubbing or edema. Good distal pulses bilateral SKin: Warm, no lesions or rashes  Musculoskeletal: No deformities Neuro: no focal signs  Nuclear stress 11/2014 T wave inversion was noted during stress in the V4, V5 and V6 leads, beginning at 1 minutes of stress, ending at 7 minutes of stress, and returning to baseline after 5-9 mins of recovery. ST segment depression was noted during stress in the V4, V5, V6 and II leads, beginning at 1 minutes of stress, ending at 7 minutes of stress. Defect 1: There is a medium defect of moderate severity present in the basal inferoseptal, basal inferior and mid inferior location. The left ventricular ejection fraction is mildly decreased (45-54%). This is a low risk study. Significant diaphragmatic attenuation and extracardiac uptake, but cannot rule out ischemia in the basal to mid inferior and inferoseptal walls. Wall motion is normal, so favor artifact.  2Decho 05/2015 Study Conclusions   - Left ventricle: The cavity size was normal. There was mild   concentric hypertrophy. Systolic function was vigorous. The   estimated ejection fraction was in the range of 65% to 70%.   Features are consistent with a pseudonormal left ventricular    filling pattern, with concomitant abnormal relaxation and   increased filling pressure (grade 2 diastolic dysfunction).   Doppler parameters are consistent with elevated ventricular   end-diastolic filling pressure. - Aortic valve: Structurally normal valve. There was no   regurgitation. - Mitral valve: Structurally normal valve. There was mild   regurgitation. - Left atrium: The atrium was moderately dilated. - Right ventricle: The cavity size was normal. Wall thickness was   normal. Systolic function was normal. - Tricuspid valve: There was mild regurgitation. - Pulmonary arteries: Systolic pressure was mildly to moderately   increased. PA peak pressure: 45 mm Hg (S). - Inferior vena cava: The vessel was dilated. The respirophasic   diameter changes were blunted (< 50%), consistent with elevated   central venous pressure. - Pericardium, extracardiac: The pericardium was normal in   appearance.      Assessment/Plan: Acute on chronic diastolic CHF secondary to missing meds last week. Has diuresed 4.2 L since admission  and weight 204.5 to 201 lbs today(188lbs in Jan). On Lasix 40 mg IV BID, usually takes 40 mg once daily at home. Crt up to 2.43 today. May need to decrease lasix to once daily.  HTN uncontrolled. BP was up in office 02/2015 and Norvasc 5 mg daily added. She says she still takes is as OP. Will resume.  Chronic Afib with RVR secondary to missing meds now controlled on IV diltiazem. Would switch to oral today. Usually takes 120 mg daily as outpatient-will resume. CHADSVASC=8 previously on Eliquis 2.5 mg BID. Discussed with Dr. Bronson Ing who doesn't believe he stopped it and no notes indicating this. He recommends restarting low dose eliquis 2.5 mg BID if no GI bleeding. She does have chronic anemia felt secondary to renal but Hbg was 6.4 in April. No history of GI bleeding.  Abnormal myoview 11/2014, but most likely diaphragmatic attenuation. No angina and troponins flat.  Continue to follow.  COPD-no beta blockers  History of CVA/TIA  CKD stage 4  DM    Ermalinda Barrios 07/21/2015, 9:43 AM    Patient examined chart reviewed. Chronically ill female with medication compliance issues. Multiple ER visits for CHF.  Exam with trace LE edema bibasilar crackles and JVP elevation No murmur.  Agree with iv diuresis and follow Cr. Change to PO cardizem for rate control and start low dose eliquis per SK  2.5 bid.  Follow Hct and consider aranasp or other erythropoiten Rx for anemia.  Jenkins Rouge

## 2015-07-21 NOTE — Progress Notes (Signed)
PROGRESS NOTE  Jody [name] [id number] DOB: Jan 14, 1941 DOA: 07/19/2015 PCP: Monico Blitz, MD  Brief Narrative: 75 year old woman PMH diastolic heart failure presented with increasing shortness of breath, admitted for atrial fibrillation with rapid ventricular response, mild troponin elevation, acute on chronic diastolic congestive heart failure.   Assessment/Plan: 1. Acute on chronic diastolic congestive heart failure. History of grade 2 diastolic dysfunction. BNP 951 on admission. [facility] input. Improving. 2. Atrial fibrillation with rapid ventricular response, on aspirin, Plavix. Reports anticoagulation with Eliquis an outpatient. Stable on oral diltiazem. 3. Elevated troponin, suspect demand ischemia. EKG nonacute. 4. Chronic kidney disease stage IV at baseline. 5. Transaminitis of unclear significance. No vomiting or abdominal pain. 6. COPD. Stable. No wheezing noted.  7. Diabetes mellitus. Hgb A1c 5.9. Continue SSI   Appears to be improving.   Continue management as recommended by cardiology including oral diltiazem and lasix. Change Lasix to daily.  Repeat hepatic function panel in the morning.  Transfer to medical bed today. Anticipate discharge in 24 hours if continues to improve.  DVT prophylaxis: Heparin  Code Status: Full Family Communication: No family bedsdie Disposition Plan: Anticipate discharge in 24 hours.   Murray Hodgkins, MD  [facility] Direct contact: [phone] --Via amion app OR  --[email or web address]; password [email or web address]  7PM-7AM contact night coverage as above 07/21/2015, 11:46 AM  LOS: 2 days   Consultants:  Cardiology  Procedures:  None  Antimicrobials:  None  HPI/Subjective: Feels ok. Feet and legs are still swollen, although swelling has improved. Denies pain but they are heavy. Denies any nausea and vomtiing. Breathing has improved.   Objective: Filed Vitals:   07/21/15 0743 07/21/15 0837 07/21/15 1030 07/21/15 1113    BP:   123/68 123/68  Pulse:      Temp:  98 F (36.7 C)    TempSrc:  Oral    Resp:      Height:      Weight:      SpO2: 100%       Intake/Output Summary (Last 24 hours) at 07/21/15 1146 Last data filed at 07/21/15 1025  Gross per 24 hour  Intake    840 ml  Output   2100 ml  Net  -1260 ml     Filed Weights   07/19/15 2117 07/20/15 0500 07/21/15 0500  Weight: 93.5 kg (206 lb 2.1 oz) 92.7 kg (204 lb 5.9 oz) 91.3 kg (201 lb 4.5 oz)    Exam: Constitutional:  . Appears calm and comfortable Eyes:  . PERRL and irises appear normal . Conjunctivae and lids appear normal ENMT:  . external ears, nose appear normal . grossly normal hearing  . Lips appear normal; Neck:  . neck appears normal, no masses, normal ROM, supple . no thyromegaly Respiratory:  . CTA bilaterally, no w/r/r.  . Respiratory effort normal. No retractions or accessory muscle use Cardiovascular:  . Irregular otherwise normal. no m/r/g . 1-2 pedal edema.  . Telemetry afib Abdomen:  . Abdomen appears normal; no tenderness or masses Musculoskeletal:  . Digits/nails: no clubbing, cyanosis, petechiae, infection . RUE, LUE, RLE, LLE   o strength and tone normal, no atrophy, no abnormal movements o No tenderness, masses Skin:  . No rashes, lesions, ulcers noted . palpation of skin: no induration or nodules Neurologic:  . Grossly normal Psychiatric:  . judgement and insight appear normal . Mental status o Mood, affect appropriate  I have personally reviewed following labs and imaging studies:  CBG stable.   BMP consistent with CKD.  Hgb stable 8.7  Troponins flat  Scheduled Meds: . amLODipine  5 mg Oral Daily  . aspirin EC  81 mg Oral Daily  . atorvastatin  40 mg Oral q1800  . clopidogrel  75 mg Oral Daily  . diltiazem  120 mg Oral Daily  . [START ON 07/22/2015] furosemide  40 mg Intravenous Daily  . gabapentin  100 mg Oral TID  . guaiFENesin  1,200 mg Oral BID  . heparin subcutaneous   5,000 Units Subcutaneous Q8H  . insulin aspart  0-9 Units Subcutaneous TID WC  . insulin glargine  15 Units Subcutaneous Daily  . mometasone-formoterol  2 puff Inhalation BID   Continuous Infusions:    Principal Problem:   Acute on chronic diastolic CHF (congestive heart failure) (HCC) Active Problems:   Essential hypertension   Type 2 diabetes mellitus with other circulatory complications (HCC)   Atrial fibrillation with rapid ventricular response (HCC)   COPD (chronic obstructive pulmonary disease) ([facility] [place])   CKD stage 4 due to type 2 diabetes mellitus ([place])   LOS: 2 days   Time spent: 20 minutes   By signing my name below, I, Rennis Harding attest that this documentation has been prepared under the direction and in the presence of Murray Hodgkins, MD Electronically signed: Rennis Harding  07/21/2015  10:31am   I personally performed the services described in this documentation. All medical record entries made by the scribe were at my direction. I have reviewed the chart and agree that the record reflects my personal performance and is accurate and complete. Murray Hodgkins, MD

## 2015-07-21 NOTE — Care Management Important Message (Signed)
Important Message  Patient Details  [name] MRN: [id number] Date of Birth: [birth year]   Medicare Important Message Given:  Yes    Alvie Heidelberg, RN 07/21/2015, 8:37 AM

## 2015-07-22 DIAGNOSIS — I10 Essential (primary) hypertension: Secondary | ICD-10-CM

## 2015-07-22 DIAGNOSIS — J438 Other emphysema: Secondary | ICD-10-CM

## 2015-07-22 DIAGNOSIS — N184 Chronic kidney disease, stage 4 (severe): Secondary | ICD-10-CM

## 2015-07-22 DIAGNOSIS — E1122 Type 2 diabetes mellitus with diabetic chronic kidney disease: Secondary | ICD-10-CM

## 2015-07-22 DIAGNOSIS — E1159 Type 2 diabetes mellitus with other circulatory complications: Secondary | ICD-10-CM

## 2015-07-22 LAB — BASIC METABOLIC PANEL
ANION GAP: 8 (ref 5–15)
BUN: 59 mg/dL — AB (ref 6–20)
CALCIUM: 8.3 mg/dL — AB (ref 8.9–10.3)
CO2: 20 mmol/L — ABNORMAL LOW (ref 22–32)
Chloride: 108 mmol/L (ref 101–111)
Creatinine, Ser: 2.64 mg/dL — ABNORMAL HIGH (ref 0.44–1.00)
GFR calc Af Amer: 19 mL/min — ABNORMAL LOW (ref >60)
GFR, EST NON AFRICAN AMERICAN: 16 mL/min — AB (ref >60)
Glucose, Bld: 145 mg/dL — ABNORMAL HIGH (ref 65–99)
POTASSIUM: 4.9 mmol/L (ref 3.5–5.1)
SODIUM: 136 mmol/L (ref 135–145)

## 2015-07-22 LAB — GLUCOSE, CAPILLARY
GLUCOSE-CAPILLARY: 150 mg/dL — AB (ref 65–99)
GLUCOSE-CAPILLARY: 134 mg/dL — AB (ref 65–99)
Glucose-Capillary: 135 mg/dL — ABNORMAL HIGH (ref 65–99)

## 2015-07-22 LAB — HEPATIC FUNCTION PANEL
ALT: 121 U/L — AB (ref 14–54)
AST: 30 U/L (ref 15–41)
Albumin: 3.3 g/dL — ABNORMAL LOW (ref 3.5–5.0)
Alkaline Phosphatase: 147 U/L — ABNORMAL HIGH (ref 38–126)
BILIRUBIN INDIRECT: 0.4 mg/dL (ref 0.3–0.9)
Bilirubin, Direct: 0.1 mg/dL (ref 0.1–0.5)
TOTAL PROTEIN: 6.3 g/dL — AB (ref 6.5–8.1)
Total Bilirubin: 0.5 mg/dL (ref 0.3–1.2)

## 2015-07-22 MED ORDER — SENNA 8.6 MG PO TABS: 1.0000 | ORAL_TABLET | Freq: Every day | ORAL | Status: DC

## 2015-07-22 MED ORDER — BISACODYL 10 MG RE SUPP: 10.0000 mg | Freq: Every day | RECTAL | Status: DC | PRN

## 2015-07-22 MED ORDER — WARFARIN SODIUM 5 MG PO TABS
5.0000 mg | ORAL_TABLET | Freq: Once | ORAL | Status: AC
  Administered 2015-07-22: 5 mg via ORAL
  Filled 2015-07-22: qty 1

## 2015-07-22 MED ORDER — MUSCLE RUB 10-15 % EX CREA
TOPICAL_CREAM | CUTANEOUS | Status: DC | PRN
  Administered 2015-07-22: 1 via TOPICAL
  Filled 2015-07-22: qty 85

## 2015-07-22 MED ORDER — FUROSEMIDE 40 MG PO TABS: 40.0000 mg | ORAL_TABLET | Freq: Every day | ORAL | Status: DC

## 2015-07-22 MED ORDER — POLYETHYLENE GLYCOL 3350 17 G PO PACK
17.0000 g | PACK | Freq: Two times a day (BID) | ORAL | Status: DC
  Administered 2015-07-22: 17 g via ORAL
  Filled 2015-07-22: qty 1

## 2015-07-22 MED ORDER — DILTIAZEM HCL ER COATED BEADS 180 MG PO CP24: 180.0000 mg | ORAL_CAPSULE | Freq: Every day | ORAL | Status: DC

## 2015-07-22 MED ORDER — WARFARIN SODIUM 5 MG PO TABS: 5.0000 mg | ORAL_TABLET | Freq: Every day | ORAL | Status: DC

## 2015-07-22 MED ORDER — DILTIAZEM HCL ER 60 MG PO CP12
60.0000 mg | ORAL_CAPSULE | Freq: Once | ORAL | Status: AC
  Administered 2015-07-22: 60 mg via ORAL
  Filled 2015-07-22: qty 1

## 2015-07-22 MED ORDER — WARFARIN - PHARMACIST DOSING INPATIENT
Status: DC
Start: 2015-07-22 — End: 2015-07-22

## 2015-07-22 NOTE — Care Management (Signed)
Patient will discharge today to home. Does not qualify for Home O2. No other needs identified, See previous note.

## 2015-07-22 NOTE — Progress Notes (Signed)
Patient has already received IV Lasix, amlodipine and Dilt 120 today. Further IV Lasix d/c'd - will start Lasix orally tomorrow. Will order diltiazem SR 60mg  for later this afternoon with hold paramters then start dilt 180mg  tomorrow. D/c amlodipine. Per d/w MD, d/c ASA/Plavix in lieu of starting coumadin per pharmacy. Dayna Dunn PA-C

## 2015-07-22 NOTE — Discharge Summary (Signed)
Physician Discharge Summary  Jody [name] [id number] DOB: 04/29/1940 DOA: 07/19/2015  PCP: Monico Blitz, MD  Admit date: 07/19/2015 Discharge date: 07/22/2015  Recommendations for Outpatient Follow-up:   Follow-up with outpatient cardiology as directed  Newly started on warfarin, follow-up for PT/INR checked next week.   Follow-up Information    Follow up with [facility] [place] On 07/26/2015.   Specialty:  Cardiology   Why:  10 AM for PT/INR check   Contact information:   [place] [phone]      Follow up with [place] Day, MD. Schedule an appointment as soon as possible for a visit in 2 weeks.   Specialty:  Internal Medicine   Contact information:   [address] [place] [address] [phone]      Discharge Diagnoses:  1. Acute on chronic diastolic congestive heart failure.  2. Atrial fibrillation with rapid ventricular response. 3. Demand ischemia 4. Chronic kidney disease stage IV. 5. Transaminitis  6. COPD.  7. Diabetes mellitus.   Discharge Condition: Improved  Disposition: Home  Diet recommendation: Heart healthy, low-sodium   Filed Weights   07/20/15 0500 07/21/15 0500 07/22/15 0500  Weight: 92.7 kg (204 lb 5.9 oz) 91.3 kg (201 lb 4.5 oz) 91.3 kg (201 lb 4.5 oz)    History of present illness:  75 year old woman PMH diastolic heart failure presented with increasing shortness of breath, admitted for atrial fibrillation with rapid ventricular response, mild troponin elevation, acute on chronic diastolic congestive heart failure.   Hospital Course:  Ms. Granier presented with complaints of increasing shortness of breath that was found to be due to atrial fibrillation with rapid ventricular response and acute on chronic diastolic congestive heart failure. On admission she was started on IV lasix withAdequate diuresis and improvement in shortness of breath. For her atrial fibrillation she was started on IV Cardizem  and prior to discharge transitioned to oral. Cardiology recommended increasing dose of diltiazem, stopping aspirin and Plavix, stopping Eliquis and startin warfarin.   Individual issues as below:  1. Acute on chronic diastolic congestive heart failure. Acute component resolved. History of grade 2 diastolic dysfunction. BNP 951 on admission. [facility] input.  2. Atrial fibrillation with rapid ventricular response. Stable. Continue increased dose of Cardizem. Warfarin started per cardiology.  3. Demand ischemia. EKG nonacute. No further evaluation. 4. Chronic kidney disease stage IV at baseline. 5. Transaminitis trending downwards. Presumably secondary to acute CHF. 6. COPD. stable. 7. Diabetes mellitus. Hgb A1c 5.9. Stable. 8. Constipation. Continue bowel regimen.  Consultants:  Cardiology  Procedures:  None  Discharge Instructions  Discharge Instructions    Amb Referral to HF Clinic    Complete by:  As directed      Diet - low sodium heart healthy    Complete by:  As directed      Diet Carb Modified    Complete by:  As directed      Discharge instructions    Complete by:  As directed   Call your physician or seek immediate medical attention for shortness of breath, chest pain, swelling, bleeding or worsening of condition. Stop aspirin, stop Plavix, stop Norvasc. Note dosage increase to diltiazem.     Increase activity slowly    Complete by:  As directed           Current Discharge Medication List    START taking these medications   Details  warfarin (COUMADIN) 5 MG tablet Take 1 tablet (5 mg total) by mouth daily  at 6 PM. Qty: 30 tablet, Refills: 0      CONTINUE these medications which have CHANGED   Details  diltiazem (CARDIZEM CD) 180 MG 24 hr capsule Take 1 capsule (180 mg total) by mouth daily. Qty: 30 capsule, Refills: 0      CONTINUE these medications which have NOT CHANGED   Details  albuterol (PROVENTIL HFA;VENTOLIN HFA) 108 (90 BASE) MCG/ACT  inhaler Inhale 1-2 puffs into the lungs every 6 (six) hours as needed for wheezing or shortness of breath. Reported on 03/01/2015    atorvastatin (LIPITOR) 40 MG tablet Take 1 tablet by mouth daily.    fluticasone-salmeterol (ADVAIR HFA) 230-21 MCG/ACT inhaler Inhale 2 puffs into the lungs 2 (two) times daily.    furosemide (LASIX) 40 MG tablet Take 1 tablet (40 mg total) by mouth daily. Qty: 30 tablet, Refills: 1    gabapentin (NEURONTIN) 100 MG capsule Take 100 mg by mouth 3 (three) times daily.    guaiFENesin (MUCINEX) 600 MG 12 hr tablet Take 2 tablets (1,200 mg total) by mouth 2 (two) times daily. Qty: 30 tablet, Refills: 0    Insulin Degludec (TRESIBA FLEXTOUCH) 100 UNIT/ML SOPN Inject 15 Units into the skin daily.     potassium chloride (K-DUR) 10 MEQ tablet Take 1 tablet by mouth daily.      STOP taking these medications     aspirin 81 MG EC tablet      clopidogrel (PLAVIX) 75 MG tablet      predniSONE (DELTASONE) 10 MG tablet      azithromycin (ZITHROMAX) 250 MG tablet        Allergies  Allergen Reactions  . Codeine Nausea And Vomiting    The results of significant diagnostics from this hospitalization (including imaging, microbiology, ancillary and laboratory) are listed below for reference.    Significant Diagnostic Studies: Dg Chest 2 View  07/19/2015  CLINICAL DATA:  Increasing shortness of Breath EXAM: CHEST  2 VIEW COMPARISON:  07/18/2015 FINDINGS: Cardiac shadow is mildly prominent but stable. The lungs are well aerated bilaterally. Mild vascular congestion is again seen without significant interstitial edema. No focal infiltrate or sizable effusion is noted. Degenerative changes of the thoracic spine are seen. IMPRESSION: Mild vascular congestion stable from the prior exam. No acute abnormality is noted. Electronically Signed   By: Inez Catalina M.D.   On: 07/19/2015 16:36   Dg Chest 2 View  07/18/2015  CLINICAL DATA:  Shortness of breath for several days.  EXAM: CHEST  2 VIEW COMPARISON:  06/05/2015 FINDINGS: Cardiomegaly with vascular congestion. No overt edema. No confluent opacities or effusions. No acute bony abnormality. IMPRESSION: Cardiomegaly, vascular congestion. Electronically Signed   By: Rolm Baptise M.D.   On: 07/18/2015 15:42    Microbiology: Recent Results (from the past 240 hour(s))  MRSA PCR Screening     Status: None   Collection Time: 07/19/15 10:05 PM  Result Value Ref Range Status   MRSA by PCR NEGATIVE NEGATIVE Final    Comment:        The GeneXpert MRSA Assay (FDA approved for NASAL specimens only), is one component of a comprehensive MRSA colonization surveillance program. It is not intended to diagnose MRSA infection nor to guide or monitor treatment for MRSA infections.      Labs: Basic Metabolic Panel:  Recent Labs Lab 07/18/15 1500 07/19/15 1637 07/20/15 0304 07/21/15 0744 07/22/15 0400  NA 140 139 140 138 136  K 4.5 4.9 4.9 4.9 4.9  CL 113* 112*  112* 107 108  CO2 20* 20* 23 25 20*  GLUCOSE 115* 102* 105* 127* 145*  BUN 62* 59* 61* 58* 59*  CREATININE 2.45* 2.43* 2.36* 2.59* 2.64*  CALCIUM 8.2* 8.6* 8.6* 8.8* 8.3*   Liver Function Tests:  Recent Labs Lab 07/18/15 1500 07/20/15 0304 07/22/15 0400  AST 101* 84* 30  ALT 218* 200* 121*  ALKPHOS 164* 154* 147*  BILITOT 0.4 0.7 0.5  PROT 6.1* 6.2* 6.3*  ALBUMIN 3.3* 3.4* 3.3*   CBC:  Recent Labs Lab 07/18/15 1500 07/19/15 1637 07/21/15 0744  WBC 5.9 5.5 5.6  NEUTROABS  --  4.1  --   HGB 8.1* 8.1* 8.7*  HCT 25.7* 26.0* 27.4*  MCV 96.3 97.0 97.9  PLT 164 175 186   Cardiac Enzymes:  Recent Labs Lab 07/18/15 1500 07/19/15 1637 07/19/15 2116 07/20/15 0304 07/20/15 0938  TROPONINI 0.05* 0.05* 0.06* 0.05* 0.05*   BNP: BNP (last 3 results)  Recent Labs  06/05/15 0548 07/18/15 1500 07/19/15 1637  BNP 333.0* 1056.0* 951.0*    CBG:  Recent Labs Lab 07/21/15 1614 07/21/15 2103 07/22/15 0733 07/22/15 1128  07/22/15 1635  GLUCAP 126* 150* 135* 150* 134*    Principal Problem:   Acute on chronic diastolic CHF (congestive heart failure) (HCC) Active Problems:   Essential hypertension   Type 2 diabetes mellitus with other circulatory complications (HCC)   Atrial fibrillation with rapid ventricular response (HCC)   COPD (chronic obstructive pulmonary disease) ([place])   CKD stage 4 due to type 2 diabetes mellitus ([place])   Time coordinating discharge: 35 minutes   Signed:  Murray Hodgkins, MD [facility] 07/22/2015, 5:21 PM    By signing my name below, I, Rennis Harding attest that this documentation has been prepared under the direction and in the presence of Murray Hodgkins, MD Electronically signed: Rennis Harding  07/22/2015 9:30AM   I personally performed the services described in this documentation. All medical record entries made by the scribe were at my direction. I have reviewed the chart and agree that the record reflects my personal performance and is accurate and complete. Murray Hodgkins, MD

## 2015-07-22 NOTE — Discharge Instructions (Signed)

## 2015-07-22 NOTE — Progress Notes (Signed)
Pt d/c via wheelchair with family, belongings w/ pt. All d/c instructions/education given. All questions concerns answered.  Prescriptions sent electronically.

## 2015-07-22 NOTE — Progress Notes (Signed)
Patient: Jody [name] / Admit Date: 07/19/2015 / Date of Encounter: 07/22/2015, 10:56 AM   Subjective: Feeling much better. LEE nearly resolved. Breathing much better. No CP.   Objective: Telemetry:atrial fib vs flutter 80s-90s mostly Physical Exam: Blood pressure 129/84, pulse 61, temperature 98.1 F (36.7 C), temperature source Oral, resp. rate 22, height 5\' 7"  (1.702 m), weight 201 lb 4.5 oz (91.3 kg), SpO2 98 %. General: Well developed, well nourished elderly AAF in no acute distress. Head: Normocephalic, atraumatic, sclera non-icteric, no xanthomas, nares are without discharge. Neck: Negative for carotid bruits. JVP not elevated. Lungs: Mild crackles left lung base otherwise clear bilaterally to auscultation without wheezes, rales, or rhonchi. Breathing is unlabored. Heart: RRR S1 S2 without murmurs, rubs, or gallops.  Abdomen: Soft, non-tender, non-distended with normoactive bowel sounds. No rebound/guarding. Extremities: No clubbing or cyanosis. No edema. Distal pedal pulses are 2+ and equal bilaterally. Neuro: Alert and oriented X 3. Moves all extremities spontaneously. Psych:  Responds to questions appropriately with a normal affect.   Intake/Output Summary (Last 24 hours) at 07/22/15 1056 Last data filed at 07/22/15 0500  Gross per 24 hour  Intake    480 ml  Output    925 ml  Net   -445 ml    Inpatient Medications:  . amLODipine  5 mg Oral Daily  . aspirin EC  81 mg Oral Daily  . atorvastatin  40 mg Oral q1800  . clopidogrel  75 mg Oral Daily  . diltiazem  120 mg Oral Daily  . furosemide  40 mg Intravenous Daily  . gabapentin  100 mg Oral TID  . guaiFENesin  1,200 mg Oral BID  . heparin subcutaneous  5,000 Units Subcutaneous Q8H  . insulin aspart  0-9 Units Subcutaneous TID WC  . insulin glargine  15 Units Subcutaneous Daily  . mometasone-formoterol  2 puff Inhalation BID  . polyethylene glycol  17 g Oral BID  . senna  1 tablet Oral QHS   Infusions:     Labs:  Recent Labs  07/21/15 0744 07/22/15 0400  NA 138 136  K 4.9 4.9  CL 107 108  CO2 25 20*  GLUCOSE 127* 145*  BUN 58* 59*  CREATININE 2.59* 2.64*  CALCIUM 8.8* 8.3*    Recent Labs  07/20/15 0304 07/22/15 0400  AST 84* 30  ALT 200* 121*  ALKPHOS 154* 147*  BILITOT 0.7 0.5  PROT 6.2* 6.3*  ALBUMIN 3.4* 3.3*    Recent Labs  07/19/15 1637 07/21/15 0744  WBC 5.5 5.6  NEUTROABS 4.1  --   HGB 8.1* 8.7*  HCT 26.0* 27.4*  MCV 97.0 97.9  PLT 175 186    Recent Labs  07/19/15 1637 07/19/15 2116 07/20/15 0304 07/20/15 0938  TROPONINI 0.05* 0.06* 0.05* 0.05*   Invalid input(s): POCBNP  Recent Labs  07/19/15 2116  HGBA1C 5.9*     Radiology/Studies:  Dg Chest 2 View  07/19/2015  CLINICAL DATA:  Increasing shortness of Breath EXAM: CHEST  2 VIEW COMPARISON:  07/18/2015 FINDINGS: Cardiac shadow is mildly prominent but stable. The lungs are well aerated bilaterally. Mild vascular congestion is again seen without significant interstitial edema. No focal infiltrate or sizable effusion is noted. Degenerative changes of the thoracic spine are seen. IMPRESSION: Mild vascular congestion stable from the prior exam. No acute abnormality is noted. Electronically Signed   By: Inez Catalina M.D.   On: 07/19/2015 16:36   Dg Chest 2 View  07/18/2015  CLINICAL DATA:  Shortness of  breath for several days. EXAM: CHEST  2 VIEW COMPARISON:  06/05/2015 FINDINGS: Cardiomegaly with vascular congestion. No overt edema. No confluent opacities or effusions. No acute bony abnormality. IMPRESSION: Cardiomegaly, vascular congestion. Electronically Signed   By: Rolm Baptise M.D.   On: 07/18/2015 15:42     Assessment and Plan   25F with chronic diastolic CHF, CKD stage IV, chronic AF on diltiazem (no BB 2/2 COPD), anemia of chronic disease (s/p 2U PRBC 05/2015), HLD, COPD, HTN, stroke, NSVT, DM admitted with CHF in setting of missing at least one day of meds. In 05/2015 during adm for  CHF/anemia she had reported her cardiologist taking her off Eliquis but note from Dr. Bronson Ing did not suggest this. She was on Plavix at that time.  Returned 07/19/15 with SOB, CHF, AF RVR. CHADSVASC 8.   1. Acute on chronic diastolic CHF - weight 123456, -4.6L. Received IV Lasix this AM. Suspect we can change to oral Lasix starting tomorrow.   2. Chronic atrial fib with RVR with possible flutter on telemetry - rates better. Will review anticoag plans with MD - ASA and Plavix are ineffective for stroke prophylaxis and will only convey further bleeding risk without the benefit of prevention in this patient with very high CHADSVASC score. There was discussion regarding resumption of low dose Eliquis but renal function is quite poor. MD to review.  3. CKD stage IV (recent baseline Cr appears 2.4-2.5) - follow with diuresis. Should f/u with nephrology as OP.  4. Anemia of chronic disease - per IM. Denies bleeding.  5. Elevated troponin - no angina reported, troponins flat, likely due to CHF. Can f/u as outpatient to monitor further, not a good candidate for invasive cardiac eval with anemia and advanced chronic kidney disease.  6. HTN - improved. Will review combo of amlodipine + dilt with MD. ?Could increase dilt to 180mg  daily and d/c amlodipine for simplicity? HR 80s-90s   Signed, Melina Copa PA-C Pager: [phone]   The patient was seen and examined, and I agree with the physical exam, assessment and plan as documented above which has been discussed with D. Dunn PA-C, with modifications as noted below. Pt says breathing is much better. Leg swelling has also gone down. Only complaint is constipation. I think she can be transitioned to oral Lasix today.  Given low GFR, I feel warfarin is indicated for thromboembolic risk reduction. Agree with increasing diltiazem to 180 mg daily and stopping amlodipine.  Kate Sable, MD, [facility]  07/22/2015 11:14 AM

## 2015-07-22 NOTE — Progress Notes (Signed)
ANTICOAGULATION CONSULT NOTE - Initial Consult  Pharmacy Consult for WARFARIN Indication: atrial fibrillation  Allergies  Allergen Reactions  . Codeine Nausea And Vomiting   Patient Measurements: Height: 5\' 7"  (170.2 cm) Weight: 201 lb 4.5 oz (91.3 kg) IBW/kg (Calculated) : 61.6  Vital Signs: Temp: 98.1 F (36.7 C) (06/08 0802) Temp Source: Oral (06/08 0802) BP: 129/84 mmHg (06/08 1000) Pulse Rate: 61 (06/08 0600)  Labs:  Recent Labs  07/19/15 1637 07/19/15 2116 07/20/15 0304 07/20/15 0938 07/21/15 0744 07/22/15 0400  HGB 8.1*  --   --   --  8.7*  --   HCT 26.0*  --   --   --  27.4*  --   PLT 175  --   --   --  186  --   CREATININE 2.43*  --  2.36*  --  2.59* 2.64*  TROPONINI 0.05* 0.06* 0.05* 0.05*  --   --    Estimated Creatinine Clearance: 20.7 mL/min (by C-G formula based on Cr of 2.64).  Medical History: Past Medical History  Diagnosis Date  . Hypertension   . Heart disease   . Gait difficulty 09/02/2014  . Hypercholesterolemia   . COPD (chronic obstructive pulmonary disease) ([place])   . Type II diabetes mellitus ([place])   . Chronic bronchitis ([place])     "get it q yr" (12/07/2014)  . Stroke ([place]) 05/2014    denies residual on 12/07/2014  . Arthritis     "bad in my legs" (12/07/2014)  . Chronic back pain     "dr said my spine is crooked"   Medications:  Prescriptions prior to admission  Medication Sig Dispense Refill Last Dose  . albuterol (PROVENTIL HFA;VENTOLIN HFA) 108 (90 BASE) MCG/ACT inhaler Inhale 1-2 puffs into the lungs every 6 (six) hours as needed for wheezing or shortness of breath. Reported on 03/01/2015   07/19/2015 at Unknown time  . aspirin 81 MG EC tablet Take 1 tablet (81 mg total) by mouth daily. 30 tablet 1 07/19/2015 at Unknown time  . atorvastatin (LIPITOR) 40 MG tablet Take 1 tablet by mouth daily.   07/19/2015 at Unknown time  . clopidogrel (PLAVIX) 75 MG tablet Take 75 mg by mouth daily.   07/19/2015 at 0900  . diltiazem (CARDIZEM CD) 120  MG 24 hr capsule Take 1 capsule (120 mg total) by mouth daily. 30 capsule 6 07/19/2015 at Unknown time  . fluticasone-salmeterol (ADVAIR HFA) 230-21 MCG/ACT inhaler Inhale 2 puffs into the lungs 2 (two) times daily.   07/19/2015 at Unknown time  . furosemide (LASIX) 40 MG tablet Take 1 tablet (40 mg total) by mouth daily. 30 tablet 1 07/19/2015 at Unknown time  . gabapentin (NEURONTIN) 100 MG capsule Take 100 mg by mouth 3 (three) times daily.   07/18/2015 at Unknown time  . guaiFENesin (MUCINEX) 600 MG 12 hr tablet Take 2 tablets (1,200 mg total) by mouth 2 (two) times daily. 30 tablet 0 07/18/2015 at Unknown time  . Insulin Degludec (TRESIBA FLEXTOUCH) 100 UNIT/ML SOPN Inject 15 Units into the skin daily.    07/18/2015 at Unknown time  . potassium chloride (K-DUR) 10 MEQ tablet Take 1 tablet by mouth daily.   07/19/2015 at Unknown time  . predniSONE (DELTASONE) 10 MG tablet Take 40mg  po daily for 1 day then 30mg  daily for 1 day then 20mg  daily for 1 day then 10mg  daily for 1 day then stop 10 tablet 0 07/19/2015 at Unknown time  . azithromycin (ZITHROMAX) 250 MG tablet  Take 1 tablet (250 mg total) by mouth daily. (Patient not taking: Reported on 07/19/2015) 2 each 0    Assessment: 75yo female with chronic AFib.  Pt reportedly has been on Eliquis Rx in the past however renal fxn is poor.  Asked to initiate Warfarin.    Goal of Therapy:  INR 2-3 Monitor platelets by anticoagulation protocol: Yes   Plan:  Warfarin 5mg  po today x 1 INR daily Continue SQ Heparin until INR at goal (VTE prophylaxis) Monitor CBC, s/sx of bleeding problems Provide Warfarin education (it appears pt has been on it in the past, > 2009 / 2016)  [place] Crane, Orazio Weller A 07/22/2015,11:35 AM

## 2015-07-22 NOTE — Progress Notes (Signed)
   07/22/15 1300 07/22/15 1310 07/22/15 1320  Oxygen Therapy  SpO2 100 % (rest) 94 % (walking spo2) 100 %  O2 Device Room [place]

## 2015-07-22 NOTE — Progress Notes (Signed)
PROGRESS NOTE  Jody [name] [id number] DOB: July 29, 1940 DOA: 07/19/2015 PCP: Monico Blitz, MD  Brief Narrative: 75 year old woman PMH diastolic heart failure presented with increasing shortness of breath, admitted for atrial fibrillation with rapid ventricular response, mild troponin elevation, acute on chronic diastolic congestive heart failure.   Assessment/Plan: 1. Acute on chronic diastolic congestive heart failure. Acute component resolved. History of grade 2 diastolic dysfunction. BNP 951 on admission. [facility] input.  2. Atrial fibrillation with rapid ventricular response. Stable. Continue increased dose of Cardizem. Warfarin started per cardiology.  3. Demand ischemia. EKG nonacute. No further evaluation. 4. Chronic kidney disease stage IV at baseline. 5. Transaminitis trending downwards. Presumably secondary to acute CHF. 6. COPD. stable. 7. Diabetes mellitus. Hgb A1c 5.9. Stable. 8. Constipation. Continue bowel regimen.   Doing well.  Home today, meds per cardiology (discussed with Dr. Bronson Ing: diltiazem 180 mg daily, start warfarin; stop amlodipine, ASA, Plavix)  DVT prophylaxis: Heparin  Code Status: Full Family Communication: No family bedsdie Disposition Plan: Anticipate discharge today    Murray Hodgkins, MD  [facility] Direct contact: [phone] --Via amion app OR  --[email or web address]; password [email or web address]  7PM-7AM contact night coverage as above 07/22/2015, 7:03 AM  LOS: 3 days   Consultants:  Cardiology  Procedures:  None  Antimicrobials:  None  HPI/Subjective: Complains of constipation and associated headache. Nausea has resolved overnight and has been able to eat without difficulty. No difficulty breathing.   Objective: Filed Vitals:   07/22/15 0300 07/22/15 0400 07/22/15 0500 07/22/15 0600  BP:  115/98  114/99  Pulse: 33 132 57 61  Temp:  98.3 F (36.8 C)    TempSrc:  Oral    Resp: 10 15 10 8   Height:      Weight:    91.3 kg (201 lb 4.5 oz)   SpO2: 100% 100% 100% 100%    Intake/Output Summary (Last 24 hours) at 07/22/15 0703 Last data filed at 07/22/15 0500  Gross per 24 hour  Intake    720 ml  Output   1125 ml  Net   -405 ml     Filed Weights   07/20/15 0500 07/21/15 0500 07/22/15 0500  Weight: 92.7 kg (204 lb 5.9 oz) 91.3 kg (201 lb 4.5 oz) 91.3 kg (201 lb 4.5 oz)    Exam: Constitutional:  . Appears calm and comfortable. Sitting up in chair.  Respiratory:  . CTA bilaterally, no w/r/r.  . Respiratory effort normal. No retractions or accessory muscle use Cardiovascular:  . Irregular otherwise normal. no m/r/g . Trace pedal edema.  . Telemetry afib Psychiatric:  . judgement and insight appear normal . Mental status o Mood, affect appropriate  I have personally reviewed following labs and imaging studies:  CBG stable.   BMP consistent with CKD.   Hgb stable 8.7  LFTs trending down  Scheduled Meds: . amLODipine  5 mg Oral Daily  . aspirin EC  81 mg Oral Daily  . atorvastatin  40 mg Oral q1800  . clopidogrel  75 mg Oral Daily  . diltiazem  120 mg Oral Daily  . furosemide  40 mg Intravenous Daily  . gabapentin  100 mg Oral TID  . guaiFENesin  1,200 mg Oral BID  . heparin subcutaneous  5,000 Units Subcutaneous Q8H  . insulin aspart  0-9 Units Subcutaneous TID WC  . insulin glargine  15 Units Subcutaneous Daily  . mometasone-formoterol  2 puff Inhalation BID   Continuous Infusions:    Principal Problem:   Acute on  chronic diastolic CHF (congestive heart failure) ([place]) Active Problems:   Essential hypertension   Type 2 diabetes mellitus with other circulatory complications (HCC)   Atrial fibrillation with rapid ventricular response (HCC)   COPD (chronic obstructive pulmonary disease) ([place])   CKD stage 4 due to type 2 diabetes mellitus ([place])   LOS: 3 days    By signing my name below, I, Rennis Harding attest that this documentation has been prepared under the  direction and in the presence of Murray Hodgkins, MD Electronically signed: Rennis Harding  07/22/2015  9:30am   I personally performed the services described in this documentation. All medical record entries made by the scribe were at my direction. I have reviewed the chart and agree that the record reflects my personal performance and is accurate and complete. Murray Hodgkins, MD

## 2015-07-29 ENCOUNTER — Ambulatory Visit (INDEPENDENT_AMBULATORY_CARE_PROVIDER_SITE_OTHER): Payer: Medicare HMO | Admitting: *Deleted

## 2015-07-29 DIAGNOSIS — I4891 Unspecified atrial fibrillation: Secondary | ICD-10-CM | POA: Diagnosis not present

## 2015-07-29 DIAGNOSIS — Z5181 Encounter for therapeutic drug level monitoring: Secondary | ICD-10-CM | POA: Insufficient documentation

## 2015-07-29 DIAGNOSIS — I639 Cerebral infarction, unspecified: Secondary | ICD-10-CM | POA: Diagnosis not present

## 2015-07-29 LAB — POCT INR: INR: 1.6

## 2015-08-05 ENCOUNTER — Ambulatory Visit (INDEPENDENT_AMBULATORY_CARE_PROVIDER_SITE_OTHER): Payer: Medicare HMO | Admitting: *Deleted

## 2015-08-05 DIAGNOSIS — Z5181 Encounter for therapeutic drug level monitoring: Secondary | ICD-10-CM

## 2015-08-05 DIAGNOSIS — I4891 Unspecified atrial fibrillation: Secondary | ICD-10-CM | POA: Diagnosis not present

## 2015-08-05 DIAGNOSIS — I639 Cerebral infarction, unspecified: Secondary | ICD-10-CM

## 2015-08-05 LAB — POCT INR: INR: 1.1

## 2015-08-12 ENCOUNTER — Ambulatory Visit (INDEPENDENT_AMBULATORY_CARE_PROVIDER_SITE_OTHER): Payer: Medicare HMO | Admitting: *Deleted

## 2015-08-12 DIAGNOSIS — I639 Cerebral infarction, unspecified: Secondary | ICD-10-CM | POA: Diagnosis not present

## 2015-08-12 DIAGNOSIS — I4891 Unspecified atrial fibrillation: Secondary | ICD-10-CM | POA: Diagnosis not present

## 2015-08-12 DIAGNOSIS — Z5181 Encounter for therapeutic drug level monitoring: Secondary | ICD-10-CM | POA: Diagnosis not present

## 2015-08-12 LAB — POCT INR: INR: 1.1

## 2015-09-02 ENCOUNTER — Encounter: Payer: Self-pay | Admitting: Cardiovascular Disease

## 2015-09-02 ENCOUNTER — Ambulatory Visit (INDEPENDENT_AMBULATORY_CARE_PROVIDER_SITE_OTHER): Payer: Medicare HMO | Admitting: Cardiovascular Disease

## 2015-09-02 VITALS — BP 133/74 | HR 93 | Ht 66.0 in | Wt 206.0 lb

## 2015-09-02 DIAGNOSIS — I4891 Unspecified atrial fibrillation: Secondary | ICD-10-CM

## 2015-09-02 DIAGNOSIS — I5032 Chronic diastolic (congestive) heart failure: Secondary | ICD-10-CM

## 2015-09-02 DIAGNOSIS — I10 Essential (primary) hypertension: Secondary | ICD-10-CM | POA: Diagnosis not present

## 2015-09-02 DIAGNOSIS — R931 Abnormal findings on diagnostic imaging of heart and coronary circulation: Secondary | ICD-10-CM

## 2015-09-02 DIAGNOSIS — I472 Ventricular tachycardia: Secondary | ICD-10-CM | POA: Diagnosis not present

## 2015-09-02 DIAGNOSIS — I4729 Other ventricular tachycardia: Secondary | ICD-10-CM

## 2015-09-02 NOTE — Patient Instructions (Signed)
Medication Instructions:   May take an extra Lasix as needed.   Plavix removed from medication list.  Continue all other medications.    Labwork: NONE  Testing/Procedures: NONE  Follow-Up: Your physician wants you to follow up in: 6 months.  You will receive a reminder letter in the mail one-two months in advance.  If you don't receive a letter, please call our office to schedule the follow up appointment   Any Other Special Instructions Will Be Listed Below (If Applicable).  If you need a refill on your cardiac medications before your next appointment, please call your pharmacy.

## 2015-09-02 NOTE — Progress Notes (Signed)
Patient ID: Jody [name], female   DOB: 13-Aug-1940, 75 y.o.   MRN: [id number]      SUBJECTIVE: The patient presents for follow-up of atrial fibrillation and chronic diastolic heart failure. Taking aspirin and Eliquis. Hospitalized for acute on chronic diastolic heart failure in June. Denies bleeding problems. Has felt her shortness of breath and leg swelling gradually improved since being hospitalized. Denies chest pain.    Review of Systems: As per "subjective", otherwise negative.  Allergies  Allergen Reactions  . Codeine Nausea And Vomiting    Current Outpatient Prescriptions  Medication Sig Dispense Refill  . albuterol (PROVENTIL HFA;VENTOLIN HFA) 108 (90 BASE) MCG/ACT inhaler Inhale 1-2 puffs into the lungs every 6 (six) hours as needed for wheezing or shortness of breath. Reported on 03/01/2015    . aspirin EC 81 MG tablet Take 81 mg by mouth daily.    . clopidogrel (PLAVIX) 75 MG tablet Take 1 tablet by mouth daily.  0  . diltiazem (CARDIZEM CD) 180 MG 24 hr capsule Take 1 capsule (180 mg total) by mouth daily. 30 capsule 0  . ELIQUIS 2.5 MG TABS tablet Take 1 tablet by mouth 2 (two) times daily.  0  . fluticasone-salmeterol (ADVAIR HFA) 230-21 MCG/ACT inhaler Inhale 2 puffs into the lungs 2 (two) times daily.    . furosemide (LASIX) 40 MG tablet Take 1 tablet (40 mg total) by mouth daily. 30 tablet 1  . gabapentin (NEURONTIN) 300 MG capsule Take 300 mg by mouth 2 (two) times daily.  0  . Insulin Degludec (TRESIBA FLEXTOUCH) 100 UNIT/ML SOPN Inject 14 Units into the skin daily.      No current facility-administered medications for this visit.    Past Medical History  Diagnosis Date  . Hypertension   . Heart disease   . Gait difficulty 09/02/2014  . Hypercholesterolemia   . COPD (chronic obstructive pulmonary disease) ([place])   . Type II diabetes mellitus ([place])   . Chronic bronchitis ([place])     "get it q yr" (12/07/2014)  . Stroke ([place]) 05/2014    denies residual on  12/07/2014  . Arthritis     "bad in my legs" (12/07/2014)  . Chronic back pain     "dr said my spine is crooked"    Past Surgical History  Procedure Laterality Date  . Joint replacement    . Tumor excision  ~ 1970    "9# in my stomach"  . Appendectomy  ~ 1970  . Total knee arthroplasty Right ~ 1986  . Abdominal hysterectomy  ~ 1970    Social History   Social History  . Marital Status: Widowed    Spouse Name: N/A  . Number of Children: 0  . Years of Education: 9   Occupational History  . retired    Social History Main Topics  . Smoking status: Former Smoker -- 1.00 packs/day for 50 years    Types: Cigarettes    Start date: 03/07/1953    Quit date: 01/13/2013  . Smokeless tobacco: Never Used     Comment: "quit smoking cigarettes  in ~ 2005"  . Alcohol Use: 0.0 oz/week    0 Standard drinks or equivalent per week     Comment: 10/242016 "quit drinking beer in ~ 1977"  . Drug Use: No  . Sexual Activity: No   Other Topics Concern  . Not on file   Social History Narrative   Patient drinks caffeine a few times a week.   Patient is right  handed.     Filed Vitals:   09/02/15 1051  BP: 133/74  Pulse: 93  Height: 5\' 6"  (1.676 m)  Weight: 206 lb (93.441 kg)  SpO2: 99%    PHYSICAL EXAM General: NAD HEENT: Normal. Neck: No JVD, no thyromegaly. Lungs: Clear to auscultation bilaterally with normal respiratory effort. CV: Nondisplaced PMI.  Regular rate and irregular rhythm, normal S1/S2, no S3, no murmur. Trace-1+ pitting pretibial edema.     Abdomen: Soft, nontender, no distention.  Neurologic: Alert and oriented.  Psych: Normal affect. Skin: Normal. Musculoskeletal: No gross deformities.    ECG: Most recent ECG reviewed.      ASSESSMENT AND PLAN: 1. Atrial fibrillation: On long-acting diltiazem and low dose Eliquis (due to anemia).   2. Chronic diastolic heart failure: Euvolemic on Lasix 40 mg. Can take an extra 40 mg prn for increasing leg  swelling.  3. Essential HTN: Controlled. No changes.  4. Non-sustained ventricular tachycardia: No Coreg or other beta blockers due to COPD. Continue diltiazem. No ischemia on stress testing.  5. Abnormal nuclear stress test: Probable artifact based on normal regional wall motion. Will monitor. Continue ASA 81 mg.  Dispo: f/u 6 months.   Kate Sable, M.D., F.A.C.C.

## 2015-10-03 ENCOUNTER — Encounter (HOSPITAL_COMMUNITY): Payer: Self-pay | Admitting: Emergency Medicine

## 2015-10-03 ENCOUNTER — Inpatient Hospital Stay (HOSPITAL_COMMUNITY)
Admission: EM | Admit: 2015-10-03 | Discharge: 2015-10-05 | DRG: 291 | Disposition: A | Discharge status: Home / Self Care | Payer: Medicare HMO | Attending: Internal Medicine | Admitting: Internal Medicine

## 2015-10-03 ENCOUNTER — Emergency Department (HOSPITAL_COMMUNITY): Payer: Medicare HMO

## 2015-10-03 DIAGNOSIS — R945 Abnormal results of liver function studies: Secondary | ICD-10-CM

## 2015-10-03 DIAGNOSIS — R7989 Other specified abnormal findings of blood chemistry: Secondary | ICD-10-CM | POA: Diagnosis present

## 2015-10-03 DIAGNOSIS — Z79899 Other long term (current) drug therapy: Secondary | ICD-10-CM

## 2015-10-03 DIAGNOSIS — I132 Hypertensive heart and chronic kidney disease with heart failure and with stage 5 chronic kidney disease, or end stage renal disease: Principal | ICD-10-CM | POA: Diagnosis present

## 2015-10-03 DIAGNOSIS — Z87891 Personal history of nicotine dependence: Secondary | ICD-10-CM

## 2015-10-03 DIAGNOSIS — I248 Other forms of acute ischemic heart disease: Secondary | ICD-10-CM | POA: Diagnosis present

## 2015-10-03 DIAGNOSIS — Z794 Long term (current) use of insulin: Secondary | ICD-10-CM | POA: Diagnosis not present

## 2015-10-03 DIAGNOSIS — K219 Gastro-esophageal reflux disease without esophagitis: Secondary | ICD-10-CM | POA: Diagnosis present

## 2015-10-03 DIAGNOSIS — I4891 Unspecified atrial fibrillation: Secondary | ICD-10-CM | POA: Diagnosis present

## 2015-10-03 DIAGNOSIS — Z7982 Long term (current) use of aspirin: Secondary | ICD-10-CM

## 2015-10-03 DIAGNOSIS — E1122 Type 2 diabetes mellitus with diabetic chronic kidney disease: Secondary | ICD-10-CM | POA: Diagnosis present

## 2015-10-03 DIAGNOSIS — Z7901 Long term (current) use of anticoagulants: Secondary | ICD-10-CM

## 2015-10-03 DIAGNOSIS — R0602 Shortness of breath: Secondary | ICD-10-CM | POA: Diagnosis present

## 2015-10-03 DIAGNOSIS — E7800 Pure hypercholesterolemia, unspecified: Secondary | ICD-10-CM | POA: Diagnosis present

## 2015-10-03 DIAGNOSIS — J441 Chronic obstructive pulmonary disease with (acute) exacerbation: Secondary | ICD-10-CM | POA: Diagnosis present

## 2015-10-03 DIAGNOSIS — I214 Non-ST elevation (NSTEMI) myocardial infarction: Secondary | ICD-10-CM | POA: Diagnosis not present

## 2015-10-03 DIAGNOSIS — Z7951 Long term (current) use of inhaled steroids: Secondary | ICD-10-CM

## 2015-10-03 DIAGNOSIS — I5033 Acute on chronic diastolic (congestive) heart failure: Secondary | ICD-10-CM | POA: Diagnosis present

## 2015-10-03 DIAGNOSIS — E785 Hyperlipidemia, unspecified: Secondary | ICD-10-CM | POA: Diagnosis present

## 2015-10-03 DIAGNOSIS — D631 Anemia in chronic kidney disease: Secondary | ICD-10-CM | POA: Diagnosis not present

## 2015-10-03 DIAGNOSIS — I482 Chronic atrial fibrillation: Secondary | ICD-10-CM | POA: Diagnosis present

## 2015-10-03 DIAGNOSIS — Z96651 Presence of right artificial knee joint: Secondary | ICD-10-CM | POA: Diagnosis present

## 2015-10-03 DIAGNOSIS — Z8673 Personal history of transient ischemic attack (TIA), and cerebral infarction without residual deficits: Secondary | ICD-10-CM | POA: Diagnosis not present

## 2015-10-03 DIAGNOSIS — N185 Chronic kidney disease, stage 5: Secondary | ICD-10-CM | POA: Diagnosis present

## 2015-10-03 DIAGNOSIS — I10 Essential (primary) hypertension: Secondary | ICD-10-CM

## 2015-10-03 DIAGNOSIS — E1159 Type 2 diabetes mellitus with other circulatory complications: Secondary | ICD-10-CM | POA: Diagnosis present

## 2015-10-03 DIAGNOSIS — I5031 Acute diastolic (congestive) heart failure: Secondary | ICD-10-CM | POA: Diagnosis present

## 2015-10-03 DIAGNOSIS — J449 Chronic obstructive pulmonary disease, unspecified: Secondary | ICD-10-CM | POA: Diagnosis present

## 2015-10-03 DIAGNOSIS — Z6832 Body mass index (BMI) 32.0-32.9, adult: Secondary | ICD-10-CM | POA: Diagnosis not present

## 2015-10-03 DIAGNOSIS — Z90710 Acquired absence of both cervix and uterus: Secondary | ICD-10-CM

## 2015-10-03 DIAGNOSIS — N184 Chronic kidney disease, stage 4 (severe): Secondary | ICD-10-CM

## 2015-10-03 HISTORY — DX: Type 2 diabetes mellitus with diabetic chronic kidney disease: E11.22

## 2015-10-03 HISTORY — DX: Anemia in chronic kidney disease: D63.1

## 2015-10-03 HISTORY — DX: Chronic kidney disease, stage 4 (severe): N18.4

## 2015-10-03 LAB — MRSA PCR SCREENING: MRSA BY PCR: NEGATIVE

## 2015-10-03 LAB — COMPREHENSIVE METABOLIC PANEL
ALT: 108 U/L — AB (ref 14–54)
AST: 64 U/L — ABNORMAL HIGH (ref 15–41)
Albumin: 3.5 g/dL (ref 3.5–5.0)
Alkaline Phosphatase: 140 U/L — ABNORMAL HIGH (ref 38–126)
Anion gap: 6 (ref 5–15)
BUN: 68 mg/dL — ABNORMAL HIGH (ref 6–20)
CHLORIDE: 112 mmol/L — AB (ref 101–111)
CO2: 23 mmol/L (ref 22–32)
Calcium: 8.6 mg/dL — ABNORMAL LOW (ref 8.9–10.3)
Creatinine, Ser: 2.43 mg/dL — ABNORMAL HIGH (ref 0.44–1.00)
GFR, EST AFRICAN AMERICAN: 21 mL/min — AB (ref >60)
GFR, EST NON AFRICAN AMERICAN: 18 mL/min — AB (ref >60)
Glucose, Bld: 130 mg/dL — ABNORMAL HIGH (ref 65–99)
POTASSIUM: 4.3 mmol/L (ref 3.5–5.1)
SODIUM: 141 mmol/L (ref 135–145)
Total Bilirubin: 0.7 mg/dL (ref 0.3–1.2)
Total Protein: 6.8 g/dL (ref 6.5–8.1)

## 2015-10-03 LAB — IRON AND TIBC
Iron: 39 ug/dL (ref 28–170)
Saturation Ratios: 11 % (ref 10.4–31.8)
TIBC: 368 ug/dL (ref 250–450)
UIBC: 329 ug/dL

## 2015-10-03 LAB — GLUCOSE, CAPILLARY
GLUCOSE-CAPILLARY: 89 mg/dL (ref 65–99)
GLUCOSE-CAPILLARY: 107 mg/dL — AB (ref 65–99)
GLUCOSE-CAPILLARY: 229 mg/dL — AB (ref 65–99)

## 2015-10-03 LAB — BRAIN NATRIURETIC PEPTIDE: B NATRIURETIC PEPTIDE 5: 1244 pg/mL — AB (ref 0.0–100.0)

## 2015-10-03 LAB — CBC
HCT: 26.7 % — ABNORMAL LOW (ref 36.0–46.0)
Hemoglobin: 8.4 g/dL — ABNORMAL LOW (ref 12.0–15.0)
MCH: 30.1 pg (ref 26.0–34.0)
MCHC: 31.5 g/dL (ref 30.0–36.0)
MCV: 95.7 fL (ref 78.0–100.0)
PLATELETS: 190 10*3/uL (ref 150–400)
RBC: 2.79 MIL/uL — AB (ref 3.87–5.11)
RDW: 16.5 % — AB (ref 11.5–15.5)
WBC: 6.4 10*3/uL (ref 4.0–10.5)

## 2015-10-03 LAB — TROPONIN I
TROPONIN I: 0.93 ng/mL — AB (ref <0.03)
Troponin I: 0.96 ng/mL (ref <0.03)

## 2015-10-03 LAB — TSH: TSH: 2.217 u[IU]/mL (ref 0.350–4.500)

## 2015-10-03 LAB — VITAMIN B12: Vitamin B-12: 590 pg/mL (ref 180–914)

## 2015-10-03 MED ORDER — ASPIRIN EC 81 MG PO TBEC
81.0000 mg | DELAYED_RELEASE_TABLET | Freq: Every day | ORAL | Status: DC
  Administered 2015-10-03: 81 mg via ORAL
  Filled 2015-10-03: qty 1

## 2015-10-03 MED ORDER — DILTIAZEM LOAD VIA INFUSION
20.0000 mg | Freq: Once | INTRAVENOUS | Status: AC
  Administered 2015-10-03: 20 mg via INTRAVENOUS
  Filled 2015-10-03: qty 20

## 2015-10-03 MED ORDER — FUROSEMIDE 10 MG/ML IJ SOLN
40.0000 mg | INTRAMUSCULAR | Status: AC
  Administered 2015-10-03: 40 mg via INTRAVENOUS
  Filled 2015-10-03: qty 4

## 2015-10-03 MED ORDER — ACETAMINOPHEN 325 MG PO TABS
650.0000 mg | ORAL_TABLET | Freq: Four times a day (QID) | ORAL | Status: DC | PRN
  Administered 2015-10-03: 650 mg via ORAL
  Filled 2015-10-03 (×2): qty 2

## 2015-10-03 MED ORDER — PREDNISONE 20 MG PO TABS
20.0000 mg | ORAL_TABLET | Freq: Two times a day (BID) | ORAL | Status: DC
  Administered 2015-10-03 – 2015-10-04 (×2): 20 mg via ORAL
  Filled 2015-10-03 (×2): qty 1

## 2015-10-03 MED ORDER — INSULIN DEGLUDEC 100 UNIT/ML ~~LOC~~ SOPN
14.0000 [IU] | PEN_INJECTOR | Freq: Every day | SUBCUTANEOUS | Status: DC
  Administered 2015-10-03 – 2015-10-04 (×2): 14 [IU] via SUBCUTANEOUS

## 2015-10-03 MED ORDER — FAMOTIDINE 20 MG PO TABS
20.0000 mg | ORAL_TABLET | Freq: Every day | ORAL | Status: DC
  Administered 2015-10-03 – 2015-10-05 (×3): 20 mg via ORAL
  Filled 2015-10-03 (×2): qty 1

## 2015-10-03 MED ORDER — ONDANSETRON HCL 4 MG PO TABS: 4.0000 mg | ORAL_TABLET | Freq: Four times a day (QID) | ORAL | Status: DC | PRN

## 2015-10-03 MED ORDER — LEVALBUTEROL HCL 0.63 MG/3ML IN NEBU
0.6300 mg | INHALATION_SOLUTION | Freq: Four times a day (QID) | RESPIRATORY_TRACT | Status: DC
  Administered 2015-10-03 (×2): 0.63 mg via RESPIRATORY_TRACT
  Filled 2015-10-03 (×2): qty 3

## 2015-10-03 MED ORDER — NITROGLYCERIN 2 % TD OINT
0.5000 [in_us] | TOPICAL_OINTMENT | Freq: Four times a day (QID) | TRANSDERMAL | Status: DC
  Administered 2015-10-03 – 2015-10-04 (×3): 0.5 [in_us] via TOPICAL
  Filled 2015-10-03: qty 30

## 2015-10-03 MED ORDER — INSULIN ASPART 100 UNIT/ML ~~LOC~~ SOLN
0.0000 [IU] | Freq: Every day | SUBCUTANEOUS | Status: DC
  Administered 2015-10-03: 2 [IU] via SUBCUTANEOUS

## 2015-10-03 MED ORDER — FUROSEMIDE 10 MG/ML IJ SOLN: 40.0000 mg | Freq: Two times a day (BID) | INTRAMUSCULAR | Status: DC

## 2015-10-03 MED ORDER — ONDANSETRON HCL 4 MG/2ML IJ SOLN: 4.0000 mg | Freq: Four times a day (QID) | INTRAMUSCULAR | Status: DC | PRN

## 2015-10-03 MED ORDER — INSULIN ASPART 100 UNIT/ML ~~LOC~~ SOLN
0.0000 [IU] | Freq: Three times a day (TID) | SUBCUTANEOUS | Status: DC
  Administered 2015-10-04: 1 [IU] via SUBCUTANEOUS
  Administered 2015-10-04 (×2): 2 [IU] via SUBCUTANEOUS
  Administered 2015-10-05: 1 [IU] via SUBCUTANEOUS

## 2015-10-03 MED ORDER — AZITHROMYCIN 250 MG PO TABS
250.0000 mg | ORAL_TABLET | Freq: Every day | ORAL | Status: DC
  Administered 2015-10-04: 250 mg via ORAL
  Filled 2015-10-03: qty 1

## 2015-10-03 MED ORDER — DILTIAZEM HCL 100 MG IV SOLR
5.0000 mg/h | INTRAVENOUS | Status: DC
  Administered 2015-10-03: 5 mg/h via INTRAVENOUS
  Filled 2015-10-03: qty 100

## 2015-10-03 MED ORDER — ACETAMINOPHEN 650 MG RE SUPP: 650.0000 mg | Freq: Four times a day (QID) | RECTAL | Status: DC | PRN

## 2015-10-03 MED ORDER — APIXABAN 2.5 MG PO TABS
2.5000 mg | ORAL_TABLET | Freq: Two times a day (BID) | ORAL | Status: DC
  Administered 2015-10-03 (×2): 2.5 mg via ORAL
  Filled 2015-10-03 (×4): qty 1

## 2015-10-03 MED ORDER — POTASSIUM CHLORIDE CRYS ER 20 MEQ PO TBCR
20.0000 meq | EXTENDED_RELEASE_TABLET | Freq: Every day | ORAL | Status: DC
  Administered 2015-10-04 – 2015-10-05 (×2): 20 meq via ORAL
  Filled 2015-10-03 (×2): qty 1

## 2015-10-03 MED ORDER — AZITHROMYCIN 500 MG PO TABS
500.0000 mg | ORAL_TABLET | Freq: Every day | ORAL | Status: AC
  Administered 2015-10-03: 500 mg via ORAL
  Filled 2015-10-03: qty 1

## 2015-10-03 MED ORDER — ONDANSETRON HCL 4 MG/2ML IJ SOLN: 4.0000 mg | Freq: Three times a day (TID) | INTRAMUSCULAR | Status: AC | PRN

## 2015-10-03 MED ORDER — ALBUTEROL SULFATE (2.5 MG/3ML) 0.083% IN NEBU
5.0000 mg | INHALATION_SOLUTION | Freq: Once | RESPIRATORY_TRACT | Status: AC
  Administered 2015-10-03: 5 mg via RESPIRATORY_TRACT
  Filled 2015-10-03: qty 6

## 2015-10-03 MED ORDER — FENTANYL CITRATE (PF) 100 MCG/2ML IJ SOLN: 25.0000 ug | INTRAMUSCULAR | Status: DC | PRN

## 2015-10-03 MED ORDER — MOMETASONE FURO-FORMOTEROL FUM 200-5 MCG/ACT IN AERO
2.0000 | INHALATION_SPRAY | Freq: Two times a day (BID) | RESPIRATORY_TRACT | Status: DC
  Administered 2015-10-05: 2 via RESPIRATORY_TRACT
  Filled 2015-10-03: qty 8.8

## 2015-10-03 MED ORDER — DILTIAZEM HCL ER COATED BEADS 180 MG PO CP24
180.0000 mg | ORAL_CAPSULE | Freq: Every day | ORAL | Status: DC
  Administered 2015-10-03 – 2015-10-05 (×3): 180 mg via ORAL
  Filled 2015-10-03 (×4): qty 1

## 2015-10-03 MED ORDER — LEVALBUTEROL HCL 0.63 MG/3ML IN NEBU: 0.6300 mg | INHALATION_SOLUTION | Freq: Four times a day (QID) | RESPIRATORY_TRACT | Status: DC | PRN

## 2015-10-03 MED ORDER — FUROSEMIDE 10 MG/ML IJ SOLN
40.0000 mg | Freq: Two times a day (BID) | INTRAMUSCULAR | Status: DC
  Administered 2015-10-03 – 2015-10-05 (×4): 40 mg via INTRAVENOUS
  Filled 2015-10-03 (×4): qty 4

## 2015-10-03 MED ORDER — DOCUSATE SODIUM 100 MG PO CAPS
100.0000 mg | ORAL_CAPSULE | Freq: Two times a day (BID) | ORAL | Status: DC
  Administered 2015-10-03 – 2015-10-05 (×4): 100 mg via ORAL
  Filled 2015-10-03 (×7): qty 1

## 2015-10-03 NOTE — ED Notes (Signed)
Report given to [employer].

## 2015-10-03 NOTE — ED Notes (Signed)
MD notified of Troponin

## 2015-10-03 NOTE — H&P (Signed)
History and Physical    Jody [name] [id number] DOB: 05-Jun-1940 DOA: 10/03/2015  PCP: Jody Blitz, MD/CARDIOLOGY-DR. [facility] Patient coming from: Home  Chief Complaint: Shortness of breath  HPI: Jody [name] is a 75 y.o. female with medical history significant for chronic atrial fib on Eliquis, chronic diastolic heart failure with an EF of 65-70% and grade 2 diastolic dysfunction per echo 05/2015, previously abnormal nuclear stress test, COPD, diabetes mellitus, end-stage for CKD, who presents with shortness of breath. Her shortness of breath started yesterday and progressed into last night. She was sitting at the time. She is equivocal about orthopnea. She had left-sided chest pressure yesterday that lasted for a few minutes then resolved spontaneously. It was not associated with diaphoresis or nausea or radiation. She has had some chest congestion and wheezing. She has had intermittent swelling in her legs. She has had a cough with clear sputum. She denies headache, fever, chills, pleurisy, nausea, vomiting, abdominal pain, or pain with urination. She has not missed any of her medications.  ED Course: In the ED, she was initially tachycardic in atrial fibrillation and mildly hypertensive. Her EKG reveals A. fib with RVR and nonspecific T-wave and ST wave changes in the lateral leads. Her chest x-ray reveals a decrease of chronic congestive heart failure. Her labs are significant for a BNP of 1244, troponin I of 0.93, creatinine of 2.43, AST of 64, ALT of 108, and hemoglobin of 8.4. She is being admitted for further evaluation and management.  Review of Systems: As per HPI otherwise 10 point review of systems negative.    Past Medical History:  Diagnosis Date  . Anemia in chronic kidney disease (CODE) ([place]) 06/05/2015  . Arthritis    "bad in my legs" (12/07/2014)  . Atrial fibrillation ([place]) 06/05/2015  . Chronic back pain    "dr said my spine is crooked"  . Chronic bronchitis  ([place])    "get it q yr" (12/07/2014)  . CKD stage 4 due to type 2 diabetes mellitus ([place]) 06/05/2015  . COPD (chronic obstructive pulmonary disease) ([place])   . Gait difficulty 09/02/2014  . Heart disease   . Hypercholesterolemia   . Hypertension   . Stroke ([place]) 05/2014   denies residual on 12/07/2014  . Stroke with cerebral ischemia ([place])   . Type II diabetes mellitus ([place])     Past Surgical History:  Procedure Laterality Date  . ABDOMINAL HYSTERECTOMY  ~ 1970  . APPENDECTOMY  ~ 1970  . JOINT REPLACEMENT    . TOTAL KNEE ARTHROPLASTY Right ~ 1986  . TUMOR EXCISION  ~ 1970   "9# in my stomach"    Social history: She is widowed. She lives alone. She reports that she quit smoking about 2 years ago. Her smoking use included Cigarettes. She started smoking about 62 years ago. She has a 50.00 pack-year smoking history. She has never used smokeless tobacco. She reports that she drinks alcohol. She reports that she does not use drugs.  Allergies  Allergen Reactions  . Codeine Nausea And Vomiting    Family History  Problem Relation Age of Onset  . Cancer Mother   . Cancer Other      Prior to Admission medications   Medication Sig Start Date End Date Taking? Authorizing Provider  albuterol (PROVENTIL HFA;VENTOLIN HFA) 108 (90 BASE) MCG/ACT inhaler Inhale 1-2 puffs into the lungs every 6 (six) hours as needed for wheezing or shortness of breath. Reported on 03/01/2015   Yes Historical Provider, MD  aspirin EC  81 MG tablet Take 81 mg by mouth daily.   Yes Historical Provider, MD  diltiazem (CARDIZEM CD) 180 MG 24 hr capsule Take 1 capsule (180 mg total) by mouth daily. 07/23/15  Yes Samuella Cota, MD  ELIQUIS 2.5 MG TABS tablet Take 1 tablet by mouth 2 (two) times daily. 08/12/15  Yes Historical Provider, MD  fluticasone-salmeterol (ADVAIR HFA) 230-21 MCG/ACT inhaler Inhale 2 puffs into the lungs 2 (two) times daily.   Yes Historical Provider, MD  furosemide (LASIX) 40 MG tablet Take 1  tablet (40 mg total) by mouth daily. 06/07/15  Yes Kathie Dike, MD  Insulin Degludec (TRESIBA FLEXTOUCH) 100 UNIT/ML SOPN Inject 14 Units into the skin daily.    Yes Historical Provider, MD    Physical Exam: Vitals:   10/03/15 0900 10/03/15 0907 10/03/15 1048 10/03/15 1120  BP: 143/99   157/97  Pulse: 96  94   Resp: 19  15 18   Temp:      TempSrc:      SpO2: 95% 98% 96%   Weight:      Height:       Temperature 98.4.   Constitutional: NAD, calm, comfortable Vitals:   10/03/15 0900 10/03/15 0907 10/03/15 1048 10/03/15 1120  BP: 143/99   157/97  Pulse: 96  94   Resp: 19  15 18   Temp:      TempSrc:      SpO2: 95% 98% 96%   Weight:      Height:       Eyes: PERRL, lids and conjunctivae normal ENMT: Mucous membranes are moist. Posterior pharynx clear of any exudate or lesions.Normal dentition.  Neck: normal, supple, no masses, no thyromegaly Respiratory: Scattered wheezes and crackles. Normal respiratory effort. No accessory muscle use.  Cardiovascular: Irregular, irregular with tachycardia. 1+ bilateral lower extremity pitting edema.. 2+ pedal pulses. No carotid bruits.  Abdomen: no tenderness, no masses palpated. No hepatosplenomegaly. Bowel sounds positive.  Musculoskeletal: no clubbing / cyanosis. No joint deformity upper and lower extremities. Good ROM, no contractures. Normal muscle tone.  Skin: no rashes, lesions, ulcers. No induration Neurologic: CN 2-12 grossly intact. Sensation intact, DTR normal. Strength 5/5 in all 4.  Psychiatric: Normal judgment and insight. Alert and oriented x 3. Normal mood.    Labs on Admission: I have personally reviewed following labs and imaging studies  CBC:  Recent Labs Lab 10/03/15 0858  WBC 6.4  HGB 8.4*  HCT 26.7*  MCV 95.7  PLT 99991111   Basic Metabolic Panel:  Recent Labs Lab 10/03/15 0858  NA 141  K 4.3  CL 112*  CO2 23  GLUCOSE 130*  BUN 68*  CREATININE 2.43*  CALCIUM 8.6*   GFR: Estimated Creatinine  Clearance: 22.7 mL/min (by C-G formula based on SCr of 2.43 mg/dL). Liver Function Tests:  Recent Labs Lab 10/03/15 0858  AST 64*  ALT 108*  ALKPHOS 140*  BILITOT 0.7  PROT 6.8  ALBUMIN 3.5   No results for input(s): LIPASE, AMYLASE in the last 168 hours. No results for input(s): AMMONIA in the last 168 hours. Coagulation Profile: No results for input(s): INR, PROTIME in the last 168 hours. Cardiac Enzymes:  Recent Labs Lab 10/03/15 0858  TROPONINI 0.93*   BNP (last 3 results) No results for input(s): PROBNP in the last 8760 hours. HbA1C: No results for input(s): HGBA1C in the last 72 hours. CBG: No results for input(s): GLUCAP in the last 168 hours. Lipid Profile: No results for input(s): CHOL, HDL, LDLCALC,  TRIG, CHOLHDL, LDLDIRECT in the last 72 hours. Thyroid Function Tests: No results for input(s): TSH, T4TOTAL, FREET4, T3FREE, THYROIDAB in the last 72 hours. Anemia Panel: No results for input(s): VITAMINB12, FOLATE, FERRITIN, TIBC, IRON, RETICCTPCT in the last 72 hours. Urine analysis:    Component Value Date/Time   COLORURINE YELLOW 05/27/2014 1902   APPEARANCEUR CLEAR 05/27/2014 1902   LABSPEC 1.012 05/27/2014 1902   PHURINE 5.0 05/27/2014 1902   GLUCOSEU NEGATIVE 05/27/2014 1902   HGBUR NEGATIVE 05/27/2014 1902   BILIRUBINUR NEGATIVE 05/27/2014 1902   KETONESUR NEGATIVE 05/27/2014 1902   PROTEINUR 100 (A) 05/27/2014 1902   UROBILINOGEN 0.2 05/27/2014 1902   NITRITE NEGATIVE 05/27/2014 1902   LEUKOCYTESUR NEGATIVE 05/27/2014 1902   Sepsis Labs: !!!!!!!!!!!!!!!!!!!!!!!!!!!!!!!!!!!!!!!!!!!! @LABRCNTIP (procalcitonin:4,lacticidven:4) )No results found for this or any previous visit (from the past 240 hour(s)).   Radiological Exams on Admission: Dg Chest 2 View  Result Date: 10/03/2015 CLINICAL DATA:  Wheezing and chest pain EXAM: CHEST  2 VIEW COMPARISON:  July 19, 2015 FINDINGS: There is mild interstitial edema. There is no airspace consolidation or  appreciable pleural effusion. There is cardiomegaly with pulmonary venous hypertension. There is atherosclerotic calcification in the aortic arch. No adenopathy evident. There is degenerative change in the thoracic spine. No pneumothorax. IMPRESSION: Evidence of a degree of chronic congestive heart failure. No airspace consolidation. No pneumothorax. There is aortic atherosclerosis. Electronically Signed   By: Lowella Grip III M.D.   On: 10/03/2015 09:16    EKG: Independently reviewed. A. fib with RVR and nonspecific T-wave and ST wave changes in the lateral leads.  Assessment/Plan Principal Problem:   NSTEMI (non-ST elevated myocardial infarction) ([place]) Active Problems:   Atrial fibrillation with rapid ventricular response (HCC)   Acute on chronic diastolic CHF (congestive heart failure) (HCC)   COPD exacerbation (HCC)   Essential hypertension   Type 2 diabetes mellitus with other circulatory complications (HCC)   CKD stage 4 due to type 2 diabetes mellitus (HCC)   Anemia in chronic kidney disease (CODE) (HCC)   Elevated LFTs      This is a 75 year old woman with chronic atrial fibrillation on Eliquis, chronic diastolic heart failure, and abnormal nuclear stress test 11/2014, COPD, diabetes, end-stage for chronic kidney disease, who presents with shortness of breath and evidence of acute on chronic diastolic heart failure, A. fib with RVR, non-ST elevation MI, and acute on chronic congestive heart failure.     Plan: 1. EDP, Dr. Sabra Heck discussed the patient with cardiologist on call, Dr. Marlou Porch. He recommended transfer to [facility] for cardiology consultation. My colleague, Dr. Cruzita Lederer will be the accepting physician. I have requested a stepdown bed. 2. The patient was continued on Eliquis and aspirin. Diltiazem drip was started. Will hold on beta blocker due to wheezing. She received 40 mg of IV Lasix in the ED. We'll continue Lasix every 12 hours. 3. Will restart oral diltiazem in hopes  of weaning off of the diltiazem drip. 4. Will add nitroglycerin ointment every 6 hours 24 hours. 5. Will treat her COPD exacerbation with Xopenex nebulizers, prednisone, and azithromycin. Wean prednisone accordingly. 6. Will add Pepcid empirically. 7. Foley catheter ordered for strict ins and outs. 8. Continue Lantus for diabetes. Will ask sliding scale NovoLog. Will order hemoglobin A1c. 9. For further evaluation, will cycle cardiac enzymes, check a TSH, order viral hepatitis panel for transaminitis/elevated LFTs, ferritin and B12 for her chronic anemia.    DVT prophylaxis: Eliquis Code Status: Full code Family Communication: Discussed with niece,  Ms. Kirk Ruths Disposition Plan: Admission then transferred to [facility] for NSTEMI Consults called: Cardiology consult, pending Admission status: Inpatient stepdown   [facility] MD [facility] Pager [phone]  If 7PM-7AM, please contact night-coverage [email or web address] Password [facility]  10/03/2015, 11:37 AM

## 2015-10-03 NOTE — Progress Notes (Signed)
Pt family was told by Forestine Na MD to bring in pt's insulin since [facility] does not have specified insulin.   Tyler Aas Flextouch 100u injection pen in box.  Pt dose is 14 units with dinner.  Flextouch in medication refrigerator

## 2015-10-03 NOTE — ED Triage Notes (Addendum)
Pt reports sob/wheezing since yesterday with pain in L chest after eating peanuts.  Pt currently does not have any pain in chest at this time.  Pt has bilateral swelling to legs. Also, productive cough, clear sputum.

## 2015-10-03 NOTE — Consult Note (Signed)
Cardiology Consult    Patient ID: Jody [name] MRN: [id number], DOB/AGE: 05/21/1940   Admit date: 10/03/2015 Date of Consult: 10/03/2015  Primary Physician: Monico Blitz, MD Reason for Consult: Afib, CHF Primary Cardiologist: Dr. Bronson Ing Requesting Provider: Dr. Caryn Section   Patient Profile  Jody [name] is a 75 year old female with a past medical history of COPD, chronic atrial fibrillation (on Eliquis), chronic diastolic CHF, CKD stage IV , DM, HTN, HLD, and CVA. She presented to [facility] on 10/03/15 with chest pain and SOB.   History of Present Illness  Jody [name] was sitting her home on 10/02/15 around 4pm and developed acute onset dyspnea. She also had some wheezing and felt like she couldn't do much because she was weak. She continued to feel bad like this and ate some peanuts 6pm and had some transient chest pain. She tells me that her chest pain only lasted a few minutes and noticed now that her chest is sore on the left side when she touches it.   She did not sleep all night last night due to severe dyspnea and called her friend this morning who then took her to [facility] ED. Upon arrival to the ED she was found to be in atrial fibrillation with a rate of 101. Her chest X ray was consistent with CHF as there was some mild interstitial edema. BNP was elevated at 1244, and troponin was elevated at 0.93.   She has a history of CKD, baseline creatinine appears to 2.4-2.6. Her creatinine on arrival on 2.43, consistent with her baseline. She is rate controlled on arrival to [facility], rates in 70's.   Her last Echo was in April 2017, EF was normal at 65-70%, there was grade 2 diastolic dysfunction. Also had moderately increased at 62mmHg.   Past Medical History   Past Medical History:  Diagnosis Date  . Anemia in chronic kidney disease (CODE) ([place]) 06/05/2015  . Arthritis    "bad in my legs" (12/07/2014)  . Atrial fibrillation ([place]) 06/05/2015  . Chronic back pain    "dr  said my spine is crooked"  . Chronic bronchitis ([place])    "get it q yr" (12/07/2014)  . CKD stage 4 due to type 2 diabetes mellitus ([place]) 06/05/2015  . COPD (chronic obstructive pulmonary disease) ([place])   . Gait difficulty 09/02/2014  . Heart disease   . Hypercholesterolemia   . Hypertension   . Stroke ([place]) 05/2014   denies residual on 12/07/2014  . Stroke with cerebral ischemia ([place])   . Type II diabetes mellitus ([place])     Past Surgical History:  Procedure Laterality Date  . ABDOMINAL HYSTERECTOMY  ~ 1970  . APPENDECTOMY  ~ 1970  . JOINT REPLACEMENT    . TOTAL KNEE ARTHROPLASTY Right ~ 1986  . TUMOR EXCISION  ~ 1970   "9# in my stomach"     Allergies  Allergies  Allergen Reactions  . Codeine Nausea And Vomiting    Inpatient Medications    . apixaban  2.5 mg Oral BID  . aspirin EC  81 mg Oral Daily  . azithromycin  500 mg Oral Daily   Followed by  . [START ON 10/04/2015] azithromycin  250 mg Oral Daily  . diltiazem  180 mg Oral Daily  . docusate sodium  100 mg Oral BID  . famotidine  20 mg Oral Daily  . furosemide  40 mg Intravenous Q12H  . insulin aspart  0-5 Units Subcutaneous QHS  .  insulin aspart  0-9 Units Subcutaneous TID WC  . Insulin Degludec  14 Units Subcutaneous QHS  . levalbuterol  0.63 mg Nebulization Q6H  . mometasone-formoterol  2 puff Inhalation BID  . nitroGLYCERIN  0.5 inch Topical Q6H  . potassium chloride  20 mEq Oral Daily  . predniSONE  20 mg Oral BID WC    Family History    Family History  Problem Relation Age of Onset  . Cancer Mother   . Cancer Other     Social History    Social History   Social History  . Marital status: Widowed    Spouse name: N/A  . Number of children: 0  . Years of education: 9   Occupational History  . retired    Social History Main Topics  . Smoking status: Former Smoker    Packs/day: 1.00    Years: 50.00    Types: Cigarettes    Start date: 03/07/1953    Quit date: 01/13/2013  . Smokeless  tobacco: Never Used     Comment: "quit smoking cigarettes  in ~ 2005"  . Alcohol use 0.0 oz/week     Comment: 10/242016 "quit drinking beer in ~ 1977"  . Drug use: No  . Sexual activity: No   Other Topics Concern  . Not on file   Social History Narrative   Patient drinks caffeine a few times a week.   Patient is right handed.     Review of Systems    General:  No chills, fever, night sweats or weight changes.  Cardiovascular:  No chest pain, dyspnea on exertion, edema, orthopnea, palpitations, paroxysmal nocturnal dyspnea. Dermatological: No rash, lesions/masses Respiratory: No cough, dyspnea Urologic: No hematuria, dysuria Abdominal:   No nausea, vomiting, diarrhea, bright red blood per rectum, melena, or hematemesis Neurologic:  No visual changes, wkns, changes in mental status. All other systems reviewed and are otherwise negative except as noted above.  Physical Exam    Blood pressure (!) 157/106, pulse 65, temperature 97.9 F (36.6 C), temperature source Oral, resp. rate 19, height 5\' 7"  (1.702 m), weight 206 lb (93.4 kg), SpO2 100 %.  General: Pleasant, NAD Psych: Normal affect. Neuro: Alert and oriented X 3. Moves all extremities spontaneously. HEENT: Normal  Neck: Supple without bruits, 5-6 cm JVD above clavicle Lungs:  Resp regular and unlabored, diminished in bases.  Heart: RRR no s3, s4, or murmurs. Abdomen: Soft, non-tender, non-distended, BS + x 4.  Extremities: No clubbing, cyanosis +2 pretibial edema, worse on the left. DP/PT/Radials 2+ and equal bilaterally.  Labs    Troponin [facility] Test)  Recent Labs  10/03/15 0858 10/03/15 1207  TROPONINI 0.93* 0.96*   Lab Results  Component Value Date   WBC 6.4 10/03/2015   HGB 8.4 (L) 10/03/2015   HCT 26.7 (L) 10/03/2015   MCV 95.7 10/03/2015   PLT 190 10/03/2015    Recent Labs Lab 10/03/15 0858  NA 141  K 4.3  CL 112*  CO2 23  BUN 68*  CREATININE 2.43*  CALCIUM 8.6*  PROT 6.8  BILITOT  0.7  ALKPHOS 140*  ALT 108*  AST 64*  GLUCOSE 130*   Lab Results  Component Value Date   CHOL 153 05/28/2014   HDL 45 05/28/2014   LDLCALC 84 05/28/2014   TRIG 121 05/28/2014   No results found for: [facility]   Radiology Studies    Dg Chest 2 View  Result Date: 10/03/2015 CLINICAL DATA:  Wheezing and chest pain  EXAM: CHEST  2 VIEW COMPARISON:  July 19, 2015 FINDINGS: There is mild interstitial edema. There is no airspace consolidation or appreciable pleural effusion. There is cardiomegaly with pulmonary venous hypertension. There is atherosclerotic calcification in the aortic arch. No adenopathy evident. There is degenerative change in the thoracic spine. No pneumothorax. IMPRESSION: Evidence of a degree of chronic congestive heart failure. No airspace consolidation. No pneumothorax. There is aortic atherosclerosis. Electronically Signed   By: Lowella Grip III M.D.   On: 10/03/2015 09:16    EKG & Cardiac Imaging    EKG: Afib   Assessment & Plan  1. Acute on chronic diastolic CHF: recent Echo shows grade 2 diastolic dysfunction, elevated BNP and appears volume overloaded on exam. Will diurese with 40mg  IV Lasix BID and follow renal function tomorrow. Consider switching to Torsemide outpatient for enhanced gut absorption.   2. Chronic atrial fibrillation: Rate controlled on po diltiazem. Continue current dose of 180mg . On Eliquis on anticoagulation.   This patients CHA2DS2-VASc Score and unadjusted Ischemic Stroke Rate (% per year) is equal to 10.8 % stroke rate/year from a score of 8 Above score calculated as 1 point each if present [CHF, HTN, DM, Vascular=MI/PAD/Aortic Plaque, Age if 65-74, or Female], 2 points each if present [Age > 75, or Stroke/TIA/TE]  3. HTN: Hypertensive with SBP in 160's. Would avoid adding beta blocker in setting of COPD. Can add 25mg  Hydralazine BID if she continues to be hypertensive.   4. CKD stage IV: Creatinine at baseline, GFR 21. No ACE-I or ARB.  Management per primary team.   Signed, Arbutus Leas, NP 10/03/2015, 2:37 PM Pager: [phone]   Attending note:  Patient seen and examined. Reviewed records and discussed the case with Ms. Tamala Julian NP. Jody [name] presents in transfer from [facility] with recent worsening shortness of breath. She states that over the last 24 hours she has had more fatigue and shortness of breath, intermittent wheezing, has had leg edema as well. Reported orthopnea and PND overnight prompting ER visit. No definite change in fluid or sodium intake, and she reports compliance with Lasix as an outpatient, takes extra dose of Lasix 40 mg in 24 hours with her leg edema. She has also felt intermittent sense of palpitations.   On examination after breathing treatment she reports feeling somewhat better. Telemetry shows atrial fibrillation, heart rate around the [id number], systolic blood pressure 123456, respirations mid 20s. Lungs exhibit decreased breath sounds with a few scattered crackles at the bases. Cardiac exam reveals irregularly irregular rhythm, elevated JVP. She has mild leg edema. Lab work shows creatinine 2.4 which is around her baseline, AST 64, ALT 108, BNP 1244, troponin I 0.93 and 0.96, hemoglobin 8.4 which is also fairly chronic. Chest x-ray shows mild interstitial edema. ECG shows atrial fibrillation at 101 bpm with decreased R wave progression and nonspecific ST-T changes.  Acute on chronic diastolic heart failure in the setting of chronic atrial fibrillation and CKD stage IV. Evidence of volume overload noted, she will be placed on Lasix 40 mg IV twice daily for now. May consider conversion from oral Lasix to oral Demadex at discharge. Continue heart rate control medications and Eliquis for stroke prophylaxis. Cycle cardiac markers, but at this point suspect elevated troponin I is related to heart failure in the setting of CKD rather than representing ACS.  Satira Sark, M.D., F.A.C.C.

## 2015-10-03 NOTE — ED Provider Notes (Signed)
[facility] DEPT Provider Note   CSN: [id number] Arrival date & time: 10/03/15  [id number]  By signing my name below, I, Dolores Hoose, attest that this documentation has been prepared under the direction and in the presence of Noemi Chapel, MD . Electronically Signed: Dolores Hoose, Scribe. 10/03/2015. 8:20 AM.   History   Chief Complaint Chief Complaint  Patient presents with  . Shortness of Breath   The history is provided by the patient. No language interpreter was used.     HPI Comments:  Jody [name] is a 75 y.o. female with a PMHx of COPD, Afib, HTN, and CHF who presents to the Emergency Department complaining of sudden worsening shortness of breath and wheezing onset last night. She reports that she frequently gets out of breath, but this episode beginning last night is worse than others. Her symptoms are worsened with exertion and laying down. No alleviating factors noted. She reports associated bilateral lower extremity swelling despite use of compression stockings. Pt denies any recent fever or cough. Pt reports she does not feel like she has gained any water weight lately. She also reports she is compliant with all medications. Pt states she is a previous smoker and quit 7 years ago. Pt was seen earlier this year in April 2017 for an echo, and was diagnosed with grade 2 diastolic dysfunction. She was also seen in cardiology on 09/02/2015 where she was prescribed Eliquis and Palvis for her Afib.  Past Medical History:  Diagnosis Date  . Arthritis    "bad in my legs" (12/07/2014)  . Atrial fibrillation ([place]) 06/05/2015  . Chronic back pain    "dr said my spine is crooked"  . Chronic bronchitis ([place])    "get it q yr" (12/07/2014)  . CKD stage 4 due to type 2 diabetes mellitus ([place]) 06/05/2015  . COPD (chronic obstructive pulmonary disease) ([place])   . Gait difficulty 09/02/2014  . Heart disease   . Hypercholesterolemia   . Hypertension   . Stroke ([place]) 05/2014   denies residual  on 12/07/2014  . Stroke with cerebral ischemia ([place])   . Type II diabetes mellitus [facility])     Patient Active Problem List   Diagnosis Date Noted  . NSTEMI (non-ST elevated myocardial infarction) ([place]) 10/03/2015  . Encounter for therapeutic drug monitoring 07/29/2015  . COPD exacerbation ([place])   . COPD (chronic obstructive pulmonary disease) ([place]) 06/05/2015  . CKD stage 4 due to type 2 diabetes mellitus ([place]) 06/05/2015  . Anemia in chronic kidney disease (CODE) ([place]) 06/05/2015  . Acute on chronic diastolic CHF (congestive heart failure) ([place]) 06/05/2015  . Atrial fibrillation ([place]) 06/05/2015  . CHF exacerbation ([place]) 06/05/2015  . Atrial fibrillation with RVR ([place]) 12/08/2014  . Atrial fibrillation with rapid ventricular response ([place]) 12/07/2014  . TIA (transient ischemic attack) 09/02/2014  . Gait difficulty 09/02/2014  . Other specified transient cerebral ischemias   . Stroke with cerebral ischemia ([place])   . Hemispheric carotid artery syndrome   . Essential hypertension   . Hyperlipidemia   . Type 2 diabetes mellitus with other circulatory complications ([place])   . CVA (cerebral infarction) 05/27/2014    Past Surgical History:  Procedure Laterality Date  . ABDOMINAL HYSTERECTOMY  ~ 1970  . APPENDECTOMY  ~ 1970  . JOINT REPLACEMENT    . TOTAL KNEE ARTHROPLASTY Right ~ 1986  . TUMOR EXCISION  ~ 1970   "9# in my stomach"    OB History    No data available  Home Medications    Prior to Admission medications   Medication Sig Start Date End Date Taking? Authorizing Provider  albuterol (PROVENTIL HFA;VENTOLIN HFA) 108 (90 BASE) MCG/ACT inhaler Inhale 1-2 puffs into the lungs every 6 (six) hours as needed for wheezing or shortness of breath. Reported on 03/01/2015   Yes Historical Provider, MD  aspirin EC 81 MG tablet Take 81 mg by mouth daily.   Yes Historical Provider, MD  diltiazem (CARDIZEM CD) 180 MG 24 hr capsule Take 1 capsule (180 mg total) by mouth daily.  07/23/15  Yes Samuella Cota, MD  ELIQUIS 2.5 MG TABS tablet Take 1 tablet by mouth 2 (two) times daily. 08/12/15  Yes Historical Provider, MD  fluticasone-salmeterol (ADVAIR HFA) 230-21 MCG/ACT inhaler Inhale 2 puffs into the lungs 2 (two) times daily.   Yes Historical Provider, MD  furosemide (LASIX) 40 MG tablet Take 1 tablet (40 mg total) by mouth daily. 06/07/15  Yes Kathie Dike, MD  Insulin Degludec (TRESIBA FLEXTOUCH) 100 UNIT/ML SOPN Inject 14 Units into the skin daily.    Yes Historical Provider, MD    Family History Family History  Problem Relation Age of Onset  . Cancer Mother   . Cancer Other     Social History Social History  Substance Use Topics  . Smoking status: Former Smoker    Packs/day: 1.00    Years: 50.00    Types: Cigarettes    Start date: 03/07/1953    Quit date: 01/13/2013  . Smokeless tobacco: Never Used     Comment: "quit smoking cigarettes  in ~ 2005"  . Alcohol use 0.0 oz/week     Comment: 10/242016 "quit drinking beer in ~ 1977"     Allergies   Codeine   Review of Systems Review of Systems  Constitutional: Negative for fever.  Respiratory: Positive for shortness of breath and wheezing. Negative for cough.   Cardiovascular: Positive for leg swelling.  All other systems reviewed and are negative.   Physical Exam Updated Vital Signs BP 147/99   Pulse 100   Temp 98.4 F (36.9 C) (Oral)   Resp 18   Ht 5\' 7"  (1.702 m)   Wt 206 lb (93.4 kg)   SpO2 98%   BMI 32.26 kg/m   Physical Exam  Constitutional: She appears well-developed and well-nourished. She appears distressed.  HENT:  Head: Normocephalic and atraumatic.  Mouth/Throat: Oropharynx is clear and moist. No oropharyngeal exudate.  Eyes: Conjunctivae and EOM are normal. Pupils are equal, round, and reactive to light. Right eye exhibits no discharge. Left eye exhibits no discharge. No scleral icterus.  Neck: Normal range of motion. Neck supple. No JVD present. No thyromegaly  present.  No thyromegaly or lymphadenopathy of the neck.  Cardiovascular: Normal heart sounds and intact distal pulses.  Exam reveals no gallop and no friction rub.   No murmur heard. Tachycardia, no murmur, irregular  Pulmonary/Chest: She is in respiratory distress. She has wheezes. She has rales.  Tachypnea. Subtle Rales. Extratory wheezing.  Respiratory distress with minimal exertion. Visibly dyspnic with speaking.  Abdominal: Soft. Bowel sounds are normal. She exhibits no distension and no mass. There is no tenderness.  Musculoskeletal: Normal range of motion. She exhibits edema. She exhibits no tenderness.  2 + pitting edema of limbs. Right greater than left.   Lymphadenopathy:    She has no cervical adenopathy.  Neurological: She is alert. Coordination normal.  Skin: Skin is warm and dry. No rash noted. No erythema.  Psychiatric: She  has a normal mood and affect. Her behavior is normal.  Nursing note and vitals reviewed.    ED Treatments / Results  DIAGNOSTIC STUDIES:  Oxygen Saturation is 98% on RA, normal by my interpretation.    COORDINATION OF CARE:  10:46 AM Discussed treatment plan with pt at bedside and pt agreed to plan.  Labs (all labs ordered are listed, but only abnormal results are displayed) Labs Reviewed  CBC - Abnormal; Notable for the following:       Result Value   RBC 2.79 (*)    Hemoglobin 8.4 (*)    HCT 26.7 (*)    RDW 16.5 (*)    All other components within normal limits  COMPREHENSIVE METABOLIC PANEL - Abnormal; Notable for the following:    Chloride 112 (*)    Glucose, Bld 130 (*)    BUN 68 (*)    Creatinine, Ser 2.43 (*)    Calcium 8.6 (*)    AST 64 (*)    ALT 108 (*)    Alkaline Phosphatase 140 (*)    GFR calc non Af Amer 18 (*)    GFR calc Af Amer 21 (*)    All other components within normal limits  BRAIN NATRIURETIC PEPTIDE - Abnormal; Notable for the following:    B Natriuretic Peptide 1,244.0 (*)    All other components within  normal limits  TROPONIN I - Abnormal; Notable for the following:    Troponin I 0.93 (*)    All other components within normal limits    EKG  EKG Interpretation  Date/Time:  Sunday October 03 2015 08:22:40 EDT Ventricular Rate:  101 PR Interval:    QRS Duration: 87 QT Interval:  330 QTC Calculation: 428 R Axis:   18 Text Interpretation:  Atrial fibrillation Anteroseptal infarct, old Nonspecific repol abnormality, lateral leads Since last tracing rate faster Confirmed by Saverio Kader  MD, Jaterrius Ricketson ([id number]) on 10/03/2015 8:49:57 AM       Radiology Dg Chest 2 View  Result Date: 10/03/2015 CLINICAL DATA:  Wheezing and chest pain EXAM: CHEST  2 VIEW COMPARISON:  July 19, 2015 FINDINGS: There is mild interstitial edema. There is no airspace consolidation or appreciable pleural effusion. There is cardiomegaly with pulmonary venous hypertension. There is atherosclerotic calcification in the aortic arch. No adenopathy evident. There is degenerative change in the thoracic spine. No pneumothorax. IMPRESSION: Evidence of a degree of chronic congestive heart failure. No airspace consolidation. No pneumothorax. There is aortic atherosclerosis. Electronically Signed   By: Lowella Grip III M.D.   On: 10/03/2015 09:16    Procedures Procedures (including critical care time)  Medications Ordered in ED Medications  diltiazem (CARDIZEM) 1 mg/mL load via infusion 20 mg (20 mg Intravenous Bolus from Bag 10/03/15 0903)    And  diltiazem (CARDIZEM) 100 mg in dextrose 5 % 100 mL (1 mg/mL) infusion (5 mg/hr Intravenous New Bag/Given 10/03/15 0902)  albuterol (PROVENTIL) (2.5 MG/3ML) 0.083% nebulizer solution 5 mg (5 mg Nebulization Given 10/03/15 0907)  furosemide (LASIX) injection 40 mg (40 mg Intravenous Given 10/03/15 0902)     Initial Impression / Assessment and Plan / ED Course  I have reviewed the triage vital signs and the nursing notes.  Pertinent labs & imaging results that were available during my care  of the patient were reviewed by me and considered in my medical decision making (see chart for details).  Clinical Course  Comment By Time  The patient has atrial fibrillation with a rapid ventricular  rate with a pulse between 110 and 130, she does have abnormal lung sounds with both wheezing and subtle rales and appears to be fluid overloaded. When she sits up to take deep breath she becomes extremely dyspneic and appears to require increased oxygen and has increased work of breathing. X-ray, labs, Cardizem, EKG shows A. fib without any acute changes. Reviewed the medical records including prior cardiology visits. The patient is anticoagulated on Eliquis  Noemi Chapel, MD 08/20 [phone]   The pt has abnormal Hgb and Renal function but both are at baseline based on prior results and comparison.  BNP is signficiantly elevated at 1244 which is higher than baseline. Noemi Chapel, MD 08/20 [phone]  CXR with pulmonary edema Noemi Chapel, MD 08/20 [phone]   Elevated troponin - will need cardiology consultation 0- paged at 9:54 Noemi Chapel, MD 08/20 [phone]  D/w DR. Marlou Porch - will provide consult after transfer to [facility] - will d/w hospitalist. Noemi Chapel, MD 08/20 1023  D/w Dr. Caryn Section who will arrange for acceptiing physician and orders Noemi Chapel, MD 08/20 1045    CRITICAL CARE Performed by: Johnna Acosta Total critical care time: 35 minutes Critical care time was exclusive of separately billable procedures and treating other patients. Critical care was necessary to treat or prevent imminent or life-threatening deterioration. Critical care was time spent personally by me on the following activities: development of treatment plan with patient and/or surrogate as well as nursing, discussions with consultants, evaluation of patient's response to treatment, examination of patient, obtaining history from patient or surrogate, ordering and performing treatments and interventions, ordering and review of  laboratory studies, ordering and review of radiographic studies, pulse oximetry and re-evaluation of patient's condition.   Final Clinical Impressions(s) / ED Diagnoses   Final diagnoses:  NSTEMI (non-ST elevated myocardial infarction) ([place])  Acute on chronic diastolic congestive heart failure (HCC)    New Prescriptions New Prescriptions   No medications on file    I personally performed the services described in this documentation, which was scribed in my presence. The recorded information has been reviewed and is accurate.       Noemi Chapel, MD 10/03/15 1046

## 2015-10-04 LAB — COMPREHENSIVE METABOLIC PANEL
ALBUMIN: 3.1 g/dL — AB (ref 3.5–5.0)
ALK PHOS: 113 U/L (ref 38–126)
ALT: 86 U/L — AB (ref 14–54)
AST: 39 U/L (ref 15–41)
Anion gap: 7 (ref 5–15)
BUN: 62 mg/dL — AB (ref 6–20)
CALCIUM: 8.8 mg/dL — AB (ref 8.9–10.3)
CHLORIDE: 109 mmol/L (ref 101–111)
CO2: 22 mmol/L (ref 22–32)
CREATININE: 2.43 mg/dL — AB (ref 0.44–1.00)
GFR calc Af Amer: 21 mL/min — ABNORMAL LOW (ref >60)
GFR calc non Af Amer: 18 mL/min — ABNORMAL LOW (ref >60)
GLUCOSE: 210 mg/dL — AB (ref 65–99)
Potassium: 4.8 mmol/L (ref 3.5–5.1)
SODIUM: 138 mmol/L (ref 135–145)
Total Bilirubin: 0.6 mg/dL (ref 0.3–1.2)
Total Protein: 6.3 g/dL — ABNORMAL LOW (ref 6.5–8.1)

## 2015-10-04 LAB — CBC
HCT: 25.2 % — ABNORMAL LOW (ref 36.0–46.0)
Hemoglobin: 7.6 g/dL — ABNORMAL LOW (ref 12.0–15.0)
MCH: 29.1 pg (ref 26.0–34.0)
MCHC: 30.2 g/dL (ref 30.0–36.0)
MCV: 96.6 fL (ref 78.0–100.0)
PLATELETS: 176 10*3/uL (ref 150–400)
RBC: 2.61 MIL/uL — ABNORMAL LOW (ref 3.87–5.11)
RDW: 16.5 % — ABNORMAL HIGH (ref 11.5–15.5)
WBC: 6.7 10*3/uL (ref 4.0–10.5)

## 2015-10-04 LAB — VITAMIN B12: VITAMIN B 12: 616 pg/mL (ref 180–914)

## 2015-10-04 LAB — ABO/RH: ABO/RH(D): O POS

## 2015-10-04 LAB — GLUCOSE, CAPILLARY
GLUCOSE-CAPILLARY: 139 mg/dL — AB (ref 65–99)
Glucose-Capillary: 154 mg/dL — ABNORMAL HIGH (ref 65–99)
Glucose-Capillary: 146 mg/dL — ABNORMAL HIGH (ref 65–99)

## 2015-10-04 LAB — TYPE AND SCREEN
ABO/RH(D): O POS
ANTIBODY SCREEN: NEGATIVE

## 2015-10-04 LAB — LIPID PANEL
CHOL/HDL RATIO: 2.9 ratio
CHOLESTEROL: 156 mg/dL (ref 0–200)
HDL: 54 mg/dL (ref >40)
LDL Cholesterol: 92 mg/dL (ref 0–99)
Triglycerides: 51 mg/dL (ref <150)
VLDL: 10 mg/dL (ref 0–40)

## 2015-10-04 MED ORDER — HYDRALAZINE HCL 20 MG/ML IJ SOLN
5.0000 mg | Freq: Once | INTRAMUSCULAR | Status: DC | PRN
  Filled 2015-10-04: qty 1

## 2015-10-04 MED ORDER — MUSCLE RUB 10-15 % EX CREA
TOPICAL_CREAM | CUTANEOUS | Status: DC | PRN
  Administered 2015-10-04: 1 via TOPICAL
  Filled 2015-10-04: qty 85

## 2015-10-04 MED ORDER — APIXABAN 5 MG PO TABS
5.0000 mg | ORAL_TABLET | Freq: Two times a day (BID) | ORAL | Status: DC
  Administered 2015-10-04 – 2015-10-05 (×3): 5 mg via ORAL
  Filled 2015-10-04 (×3): qty 1

## 2015-10-04 NOTE — Progress Notes (Signed)
    Subjective:  Denies CP; dyspnea improving   Objective:  Vitals:   10/03/15 1934 10/03/15 2022 10/03/15 2308 10/04/15 0334  BP: 140/74  (!) 140/98 (!) 142/73  Pulse: 98  87 74  Resp: 19  16 14   Temp: 98.2 F (36.8 C)  99 F (37.2 C) 98.1 F (36.7 C)  TempSrc: Oral  Oral Oral  SpO2: 98% 98% 99% 98%  Weight:    202 lb 6.4 oz (91.8 kg)  Height:        Intake/Output from previous day:  Intake/Output Summary (Last 24 hours) at 10/04/15 0731 Last data filed at 10/04/15 0342  Gross per 24 hour  Intake                0 ml  Output             3850 ml  Net            -3850 ml    Physical Exam: Physical exam: Well-developed well-nourished in no acute distress.  Skin is warm and dry.  HEENT is normal.  Neck is supple.  Chest is clear to auscultation with normal expansion.  Cardiovascular exam is irregular Abdominal exam nontender or distended. No masses palpated. Extremities show trace edema. neuro grossly intact    Lab Results: Basic Metabolic Panel:  Recent Labs  10/03/15 0858 10/04/15 0301  NA 141 138  K 4.3 4.8  CL 112* 109  CO2 23 22  GLUCOSE 130* 210*  BUN 68* 62*  CREATININE 2.43* 2.43*  CALCIUM 8.6* 8.8*   CBC:  Recent Labs  10/03/15 0858 10/04/15 0301  WBC 6.4 6.7  HGB 8.4* 7.6*  HCT 26.7* 25.2*  MCV 95.7 96.6  PLT 190 176   Cardiac Enzymes:  Recent Labs  10/03/15 0858 10/03/15 1207  TROPONINI 0.93* 0.96*     Assessment/Plan:  1 acute on chronic diastolic congestive heart failure-patient is improving. Will continue present dose of Lasix today. Transition to oral Demadex tomorrow morning if stable. I/O - 3850. Weight 202. 2 permanent atrial fibrillation-continue Cardizem for rate control. Her apixaban is underdosed. Increase to 5 mg twice a day. Given need for anticoagulation will discontinue aspirin. 3 chronic stage IV kidney disease-follow renal function closely with diuresis. She is followed by nephrology in [place]. 4  hypertension-continue present blood pressure medications. 5 elevated troponin-she is not having chest pain. There is no clear trend and therefore not consistent with acute coronary syndrome. She had a nuclear study in October 2016 that was likely felt to be artifact without significant ischemia. She would be high risk for contrast nephropathy. Would continue medical therapy. Discontinue nitrates. 6 chronic anemia-likely from long-standing renal insufficiency. Repeat hemoglobin tomorrow. She will follow-up with nephrology as an outpatient. Epogen should be considered. Patient can be transferred to telemetry from a cardiac standpoint. Kirk Ruths 10/04/2015, 7:31 AM

## 2015-10-04 NOTE — Progress Notes (Signed)
Pt arrived on [facility] from [facility]; pt has been oriented to room, surroundings, and any applicable equipment. VSS.  CCMD notified of telemetry. Pt has been educated on the [employer] .  Pt in stable condition.

## 2015-10-04 NOTE — Progress Notes (Signed)
Pt. BP 164/98 with no scheduled or PRN BP meds for tonight. On call NP for [place], [place]. Paged. RN awaiting new orders. PT. Stable. No distress noted. RN will continue to monitor. Venida Tsukamoto, Katherine Roan

## 2015-10-04 NOTE — Progress Notes (Addendum)
PROGRESS NOTE                                                                                                                                                                                                             Patient Demographics:    Jody [name], is a 75 y.o. female, DOB - 06/29/1940, [id number]  Admit date - 10/03/2015   Admitting Physician Rexene Alberts, MD  Outpatient Primary MD for the patient is [facility], MD  LOS - 1  Chief Complaint  Patient presents with  . Shortness of Breath       Brief Narrative    Jody [name] is a 75 y.o. female with medical history significant for chronic atrial fib on Eliquis, chronic diastolic heart failure with an EF of 65-70% and grade 2 diastolic dysfunction per echo 05/2015, previously abnormal nuclear stress test, COPD, diabetes mellitus, end-stage for CKD, who presents with shortness of breath. Her shortness of breath started yesterday and progressed into last night. She was sitting at the time. She is equivocal about orthopnea. She had left-sided chest pressure yesterday that lasted for a few minutes then resolved spontaneously. It was not associated with diaphoresis or nausea or radiation. She has had some chest congestion and wheezing. She has had intermittent swelling in her legs. She has had a cough with clear sputum. She denies headache, fever, chills, pleurisy, nausea, vomiting, abdominal pain, or pain with urination. She has not missed any of her medications.  ED Course: In the ED, she was initially tachycardic in atrial fibrillation and mildly hypertensive. Her EKG reveals A. fib with RVR and nonspecific T-wave and ST wave changes in the lateral leads. Her chest x-ray reveals a decrease of chronic congestive heart failure. Her labs are significant for a BNP of 1244, troponin I of 0.93, creatinine of 2.43, AST of 64, ALT of 108, and hemoglobin of 8.4. She is  being admitted for further evaluation and management.   Subjective:    Jody [name] today has, No headache, No chest pain, No abdominal pain - No Nausea, No new weakness tingling or numbness, No Cough - Today no shortness of breath   Assessment  & Plan :     1.Acute on chronic diastolic CHF secondary to A. fib with RVR EF on recent echo 60%. Cardiology following, currently on IV Lasix with resolution  of her symptoms, continue supportive care with IV Lasix today with oxygen and nebulizer treatments as needed, increase activity titrate off oxygen if stable discharge in the morning on oral diuretics. So far 4 L negative.  2. Chronic A. fib with RVR, [place] vasc 2 score of at least 4. She is currently on diltiazem for heart rate control, rate is better, continue Eliquis, cardiology following. Stable from this standpoint.  3. Mild demand ischemia related troponin rise. Not an ACS pattern, chest pain-free EKG nonacute, on Eliquis. No further workup per cardiology.  4. Anemia of chronic disease. Monitor. If becomes symptomatic or hemoglobin drops below 7 Will transfuse orals outpatient follow-up.  5. GERD. On PPI.  6. Mildly elevated liver enzymes due to hepatic congestion, improving. Repeat CMP outpatient by PCP in 7-10 days.  7. COPD. At baseline.*Prednisone azithromycin, continue supportive care as needed   8. CKD stage V. Baseline creatinine close to 2.5, she is at baseline. Follow with nephrology at [facility] after discharge.   9. DM type II on long-acting insulin along with sliding scale will continue to monitor CBGs.  Lab Results  Component Value Date   HGBA1C 5.9 (H) 07/19/2015   CBG (last 3)   Recent Labs  10/03/15 1809 10/03/15 2102 10/04/15 0734  GLUCAP 107* 229* 139*      Family Communication  :  None present  Code Status :  Full  Diet : Heart healthy low carbohydrate  Disposition Plan  :  DC in am  Consults  :  Cards  Procedures  :     DVT Prophylaxis  :   Eliquis  Lab Results  Component Value Date   PLT 176 10/04/2015    Inpatient Medications  Scheduled Meds: . apixaban  5 mg Oral BID  . azithromycin  250 mg Oral Daily  . diltiazem  180 mg Oral Daily  . docusate sodium  100 mg Oral BID  . famotidine  20 mg Oral Daily  . furosemide  40 mg Intravenous BID  . insulin aspart  0-5 Units Subcutaneous QHS  . insulin aspart  0-9 Units Subcutaneous TID WC  . Insulin Degludec  14 Units Subcutaneous QHS  . mometasone-formoterol  2 puff Inhalation BID  . potassium chloride  20 mEq Oral Daily  . predniSONE  20 mg Oral BID WC   Continuous Infusions:  PRN Meds:.acetaminophen **OR** acetaminophen, fentaNYL (SUBLIMAZE) injection, levalbuterol, ondansetron **OR** [DISCONTINUED] ondansetron (ZOFRAN) IV  Antibiotics  :    Anti-infectives    Start     Dose/Rate Route Frequency Ordered Stop   10/04/15 1000  azithromycin (ZITHROMAX) tablet 250 mg     250 mg Oral Daily 10/03/15 1126 10/08/15 0959   10/03/15 1130  azithromycin (ZITHROMAX) tablet 500 mg     500 mg Oral Daily 10/03/15 1126 10/03/15 2304         Objective:   Vitals:   10/03/15 2022 10/03/15 2308 10/04/15 0334 10/04/15 0820  BP:  (!) 140/98 (!) 142/73 136/90  Pulse:  87 74 94  Resp:  16 14 19   Temp:  99 F (37.2 C) 98.1 F (36.7 C) 97.4 F (36.3 C)  TempSrc:  Oral Oral Oral  SpO2: 98% 99% 98% 100%  Weight:   91.8 kg (202 lb 6.4 oz)   Height:        Wt Readings from Last 3 Encounters:  10/04/15 91.8 kg (202 lb 6.4 oz)  09/02/15 93.4 kg (206 lb)  07/22/15 91.3 kg (201 lb 4.5  oz)     Intake/Output Summary (Last 24 hours) at 10/04/15 0956 Last data filed at 10/04/15 0342  Gross per 24 hour  Intake                0 ml  Output             3650 ml  Net            -3650 ml     Physical Exam  Awake Alert, Oriented X 3, No new F.N deficits, Normal affect [place].AT,PERRAL Supple Neck,No JVD, No cervical lymphadenopathy appriciated.  Symmetrical Chest wall movement,  Good air movement bilaterally, few rales RRR,No Gallops,Rubs or new Murmurs, No Parasternal Heave +ve B.Sounds, Abd Soft, No tenderness, No organomegaly appriciated, No rebound - guarding or rigidity. No Cyanosis, Clubbing or edema, No new Rash or bruise       Data Review:    CBC  Recent Labs Lab 10/03/15 0858 10/04/15 0301  WBC 6.4 6.7  HGB 8.4* 7.6*  HCT 26.7* 25.2*  PLT 190 176  MCV 95.7 96.6  MCH 30.1 29.1  MCHC 31.5 30.2  RDW 16.5* 16.5*    Chemistries   Recent Labs Lab 10/03/15 0858 10/04/15 0301  NA 141 138  K 4.3 4.8  CL 112* 109  CO2 23 22  GLUCOSE 130* 210*  BUN 68* 62*  CREATININE 2.43* 2.43*  CALCIUM 8.6* 8.8*  AST 64* 39  ALT 108* 86*  ALKPHOS 140* 113  BILITOT 0.7 0.6   ------------------------------------------------------------------------------------------------------------------  Recent Labs  10/04/15 0301  CHOL 156  HDL 54  LDLCALC 92  TRIG 51  CHOLHDL 2.9    Lab Results  Component Value Date   HGBA1C 5.9 (H) 07/19/2015   ------------------------------------------------------------------------------------------------------------------  Recent Labs  10/03/15 0858  TSH 2.217   ------------------------------------------------------------------------------------------------------------------  Recent Labs  10/03/15 1207  VITAMINB12 590  TIBC 368  IRON 39    Coagulation profile No results for input(s): INR, PROTIME in the last 168 hours.  No results for input(s): DDIMER in the last 72 hours.  Cardiac Enzymes  Recent Labs Lab 10/03/15 0858 10/03/15 1207  TROPONINI 0.93* 0.96*   ------------------------------------------------------------------------------------------------------------------    Component Value Date/Time   BNP 1,244.0 (H) 10/03/2015 0858    Micro Results Recent Results (from the past 240 hour(s))  MRSA PCR Screening     Status: None   Collection Time: 10/03/15  3:09 PM  Result Value Ref  Range Status   MRSA by PCR NEGATIVE NEGATIVE Final    Comment:        The GeneXpert MRSA Assay (FDA approved for NASAL specimens only), is one component of a comprehensive MRSA colonization surveillance program. It is not intended to diagnose MRSA infection nor to guide or monitor treatment for MRSA infections.     Radiology Reports Dg Chest 2 View  Result Date: 10/03/2015 CLINICAL DATA:  Wheezing and chest pain EXAM: CHEST  2 VIEW COMPARISON:  July 19, 2015 FINDINGS: There is mild interstitial edema. There is no airspace consolidation or appreciable pleural effusion. There is cardiomegaly with pulmonary venous hypertension. There is atherosclerotic calcification in the aortic arch. No adenopathy evident. There is degenerative change in the thoracic spine. No pneumothorax. IMPRESSION: Evidence of a degree of chronic congestive heart failure. No airspace consolidation. No pneumothorax. There is aortic atherosclerosis. Electronically Signed   By: Lowella Grip III M.D.   On: 10/03/2015 09:16    Time Spent in minutes  [place] M.D  on 10/04/2015 at 9:56 AM  Between 7am to 7pm - Pager - [phone]  After 7pm go to [email or web address] - password [facility]  [facility] -  Office  [phone]

## 2015-10-04 NOTE — Progress Notes (Signed)
Pt is a moderate fall risk (scoring 10 on fall risk scale), lives alone, and uses walker to ambulate.  Pt has blue socks on.  Pt refuses to have bed/chair alarm on.  Pt educates and she still refuses.

## 2015-10-05 DIAGNOSIS — I214 Non-ST elevation (NSTEMI) myocardial infarction: Secondary | ICD-10-CM

## 2015-10-05 LAB — BASIC METABOLIC PANEL
Anion gap: 11 (ref 5–15)
BUN: 71 mg/dL — AB (ref 6–20)
CHLORIDE: 106 mmol/L (ref 101–111)
CO2: 22 mmol/L (ref 22–32)
CREATININE: 2.71 mg/dL — AB (ref 0.44–1.00)
Calcium: 9.1 mg/dL (ref 8.9–10.3)
GFR calc Af Amer: 18 mL/min — ABNORMAL LOW (ref >60)
GFR calc non Af Amer: 16 mL/min — ABNORMAL LOW (ref >60)
Glucose, Bld: 137 mg/dL — ABNORMAL HIGH (ref 65–99)
Potassium: 4.4 mmol/L (ref 3.5–5.1)
SODIUM: 139 mmol/L (ref 135–145)

## 2015-10-05 LAB — HEPATITIS PANEL, ACUTE
HCV Ab: <0.1 s/co ratio (ref 0.0–0.9)
HEP B C IGM: NEGATIVE
HEP B S AG: NEGATIVE
Hep A IgM: NEGATIVE

## 2015-10-05 LAB — CBC
HCT: 25.8 % — ABNORMAL LOW (ref 36.0–46.0)
Hemoglobin: 7.7 g/dL — ABNORMAL LOW (ref 12.0–15.0)
MCH: 28.9 pg (ref 26.0–34.0)
MCHC: 29.8 g/dL — ABNORMAL LOW (ref 30.0–36.0)
MCV: 97 fL (ref 78.0–100.0)
PLATELETS: 193 10*3/uL (ref 150–400)
RBC: 2.66 MIL/uL — ABNORMAL LOW (ref 3.87–5.11)
RDW: 16.7 % — AB (ref 11.5–15.5)
WBC: 7.9 10*3/uL (ref 4.0–10.5)

## 2015-10-05 LAB — GLUCOSE, CAPILLARY
Glucose-Capillary: 153 mg/dL — ABNORMAL HIGH (ref 65–99)
Glucose-Capillary: 133 mg/dL — ABNORMAL HIGH (ref 65–99)

## 2015-10-05 MED ORDER — POTASSIUM CHLORIDE CRYS ER 20 MEQ PO TBCR: 20.0000 meq | EXTENDED_RELEASE_TABLET | Freq: Every day | ORAL | 0 refills | Status: DC

## 2015-10-05 MED ORDER — FUROSEMIDE 40 MG PO TABS: 40.0000 mg | ORAL_TABLET | Freq: Two times a day (BID) | ORAL | 0 refills | Status: DC

## 2015-10-05 NOTE — Progress Notes (Signed)
Orders received for pt discharge.  Discharge summary printed and reviewed with pt.  Explained medication regimen, and pt had no further questions at this time.  IV removed and site remains clean, dry, intact.  Telemetry removed.  Pt in stable condition and awaiting transport. 

## 2015-10-05 NOTE — Discharge Instructions (Signed)
Follow with Primary MD Monico Blitz, MD in 7 days   Get CBC, CMP, 2 view Chest X ray checked  by Primary MD or SNF MD in 5-7 days ( we routinely change or add medications that can affect your baseline labs and fluid status, therefore we recommend that you get the mentioned basic workup next visit with your PCP, your PCP may decide not to get them or add new tests based on their clinical decision)   Activity: As tolerated with Full fall precautions use walker/cane & assistance as needed   Disposition Home     Diet:   Heart Healthy Low carb,   Check your Weight same time everyday, if you gain over 2 pounds, or you develop in leg swelling, experience more shortness of breath or chest pain, call your Primary MD immediately. Follow Cardiac Low Salt Diet and 1.5 lit/day fluid restriction.   On your next visit with your primary care physician please Get Medicines reviewed and adjusted.   Please request your Prim.MD to go over all Hospital Tests and Procedure/Radiological results at the follow up, please get all Hospital records sent to your Prim MD by signing hospital release before you go home.   If you experience worsening of your admission symptoms, develop shortness of breath, life threatening emergency, suicidal or homicidal thoughts you must seek medical attention immediately by calling 911 or calling your MD immediately  if symptoms less severe.  You Must read complete instructions/literature along with all the possible adverse reactions/side effects for all the Medicines you take and that have been prescribed to you. Take any new Medicines after you have completely understood and accpet all the possible adverse reactions/side effects.   Do not drive, operate heavy machinery, perform activities at heights, swimming or participation in water activities or provide baby sitting services if your were admitted for syncope or siezures until you have seen by Primary MD or a Neurologist and advised to  do so again.  Do not drive when taking Pain medications.    Do not take more than prescribed Pain, Sleep and Anxiety Medications  Special Instructions: If you have smoked or chewed Tobacco  in the last 2 yrs please stop smoking, stop any regular Alcohol  and or any Recreational drug use.  Wear Seat belts while driving.   Please note  You were cared for by a hospitalist during your hospital stay. If you have any questions about your discharge medications or the care you received while you were in the hospital after you are discharged, you can call the unit and asked to speak with the hospitalist on call if the hospitalist that took care of you is not available. Once you are discharged, your primary care physician will handle any further medical issues. Please note that NO REFILLS for any discharge medications will be authorized once you are discharged, as it is imperative that you return to your primary care physician (or establish a relationship with a primary care physician if you do not have one) for your aftercare needs so that they can reassess your need for medications and monitor your lab values.

## 2015-10-05 NOTE — Progress Notes (Addendum)
Pt's own medication supply, Tresiba (insulin degludec injection) + (1) box of pen needles 15mm, have been retrieved from main pharmacy and returned to pt at discharge.  Receipt paperwork can be found in chart.

## 2015-10-05 NOTE — Discharge Summary (Signed)
Jody [name] [id number] DOB: 05-07-1940 DOA: 10/03/2015  PCP: Monico Blitz, MD  Admit date: 10/03/2015  Discharge date: 10/05/2015  Admitted From: Home  Disposition:  Home   Recommendations for Outpatient Follow-up:   Follow up with PCP in 1-2 weeks  PCP Please obtain BMP/CBC, 2 view CXR in 1week,  (see Discharge instructions)   PCP Please follow up on the following pending results: none   Home Health: None   Equipment/Devices: None  Consultations: Cards Discharge Condition: Stable   CODE STATUS: Full   Diet Recommendation: Heart Healthy Low Carb, 1.5lits/day fluid restriction   Chief Complaint  Patient presents with  . Shortness of Breath     Brief history of present illness from the day of admission and additional interim summary    Jody [name] a 75 y.o.femalewith medical history significant forchronic atrial fib on Eliquis, chronic diastolic heart failure with an EF of 65-70% and grade 2 diastolic dysfunction per echo 05/2015, previously abnormal nuclear stress test, COPD, diabetes mellitus, end-stage for CKD, who presents with shortness of breath. Her shortness of breath started yesterday and progressed into last night. She was sitting at the time. She is equivocal about orthopnea. She had left-sided chest pressure yesterday that lasted for a few minutes then resolvedspontaneously. It was not associated with diaphoresis or nausea or radiation. She has had some chest congestion and wheezing. She has had intermittent swelling in her legs. She has had a cough with clear sputum. She denies headache, fever, chills, pleurisy, nausea, vomiting, abdominal pain, or pain with urination. She has not missed any of her medications.  ED Course:In the ED, she was initially tachycardic in atrial fibrillation  and mildly hypertensive. Her EKG reveals A. fib with RVR and nonspecific T-wave and STwave changes in the lateral leads. Her chest x-ray reveals a decrease of chronic congestive heart failure. Her labs are significant for a BNP of 1244, troponin I of 0.93, creatinine of 2.43, AST of 64, ALT of 108, and hemoglobin of 8.4. She is being admitted for further evaluation and management.  Hospital issues addressed     1.Acute on chronic diastolic CHF secondary to A. fib with RVR EF on recent echo 60%. Cardiology following, currently on IV Lasix with resolution of her symptoms,  So far 5 L negative.She now is clinically compensated, have transitioned her to 40 mg of twice a day Lasix from 40 mg once a day along with 20 mg of potassium supplementation, request PCP to monitor BMP, weight diuretic dose and potassium dose closely in the outpatient setting.  2. Chronic A. fib with RVR, [place] vasc 2 score of at least 4. She is currently on diltiazem for heart rate control, rate is better, continue Eliquis, cardiology following. Stable from this standpoint.  3. Mild demand ischemia related troponin rise. Not an ACS pattern, chest pain-free EKG nonacute, on Eliquis. No further workup per cardiology.  4. Anemia of chronic disease. Monitor. Anemia panel was unremarkable, as PCP to monitor hemoglobin in the outpatient setting with 77 appropriate outpatient workup.  5. GERD. On PPI.  6. Mildly elevated liver enzymes due to hepatic congestion, improving. Repeat CMP outpatient by PCP in 7-10 days.  7. COPD. At baseline.We stopped Prednisone and azithromycin, continue supportive care as needed.  8. CKD stage V. Baseline creatinine close to 2.5, she is at baseline. Follow with her primary nephrologist after discharge.   9. DM type II on long-acting insulin , Requested to continue home regimen with outpatient PCP follow-up for glycemic control and A1c monitoring.   Discharge diagnosis     Principal  Problem:   NSTEMI (non-ST elevated myocardial infarction) [facility]) Active Problems:   Essential hypertension   Type 2 diabetes mellitus with other circulatory complications (HCC)   Atrial fibrillation with rapid ventricular response (HCC)   CKD stage 4 due to type 2 diabetes mellitus (HCC)   Anemia in chronic kidney disease (CODE) (HCC)   Acute on chronic diastolic CHF (congestive heart failure) (HCC)   COPD exacerbation (HCC)   Elevated LFTs    Discharge instructions    Discharge Instructions    Discharge instructions    Complete by:  As directed   Follow with Primary MD Monico Blitz, MD in 7 days   Get CBC, CMP, 2 view Chest X ray checked  by Primary MD or SNF MD in 5-7 days ( we routinely change or add medications that can affect your baseline labs and fluid status, therefore we recommend that you get the mentioned basic workup next visit with your PCP, your PCP may decide not to get them or add new tests based on their clinical decision)   Activity: As tolerated with Full fall precautions use walker/cane & assistance as needed   Disposition Home     Diet:   Heart Healthy  Low carb,  Check your Weight same time everyday, if you gain over 2 pounds, or you develop in leg swelling, experience more shortness of breath or chest pain, call your Primary MD immediately. Follow Cardiac Low Salt Diet and 1.5 lit/day fluid restriction.   On your next visit with your primary care physician please Get Medicines reviewed and adjusted.   Please request your Prim.MD to go over all Hospital Tests and Procedure/Radiological results at the follow up, please get all Hospital records sent to your Prim MD by signing hospital release before you go home.   If you experience worsening of your admission symptoms, develop shortness of breath, life threatening emergency, suicidal or homicidal thoughts you must seek medical attention immediately by calling 911 or calling your MD immediately  if symptoms less  severe.  You Must read complete instructions/literature along with all the possible adverse reactions/side effects for all the Medicines you take and that have been prescribed to you. Take any new Medicines after you have completely understood and accpet all the possible adverse reactions/side effects.   Do not drive, operate heavy machinery, perform activities at heights, swimming or participation in water activities or provide baby sitting services if your were admitted for syncope or siezures until you have seen by Primary MD or a Neurologist and advised to do so again.  Do not drive when taking Pain medications.    Do not take more than prescribed Pain, Sleep and Anxiety Medications  Special Instructions: If you have smoked or chewed Tobacco  in the last 2 yrs please stop smoking, stop any regular Alcohol  and or any Recreational drug use.  Wear Seat belts while driving.   Please note  You were cared for by a  hospitalist during your hospital stay. If you have any questions about your discharge medications or the care you received while you were in the hospital after you are discharged, you can call the unit and asked to speak with the hospitalist on call if the hospitalist that took care of you is not available. Once you are discharged, your primary care physician will handle any further medical issues. Please note that NO REFILLS for any discharge medications will be authorized once you are discharged, as it is imperative that you return to your primary care physician (or establish a relationship with a primary care physician if you do not have one) for your aftercare needs so that they can reassess your need for medications and monitor your lab values.   Increase activity slowly    Complete by:  As directed      Discharge Medications     Medication List    TAKE these medications   albuterol 108 (90 Base) MCG/ACT inhaler Commonly known as:  PROVENTIL HFA;VENTOLIN HFA Inhale 1-2  puffs into the lungs every 6 (six) hours as needed for wheezing or shortness of breath. Reported on 03/01/2015   aspirin EC 81 MG tablet Take 81 mg by mouth daily.   diltiazem 180 MG 24 hr capsule Commonly known as:  CARDIZEM CD Take 1 capsule (180 mg total) by mouth daily.   ELIQUIS 2.5 MG Tabs tablet Generic drug:  apixaban Take 1 tablet by mouth 2 (two) times daily.   fluticasone-salmeterol 230-21 MCG/ACT inhaler Commonly known as:  ADVAIR HFA Inhale 2 puffs into the lungs 2 (two) times daily.   furosemide 40 MG tablet Commonly known as:  LASIX Take 1 tablet (40 mg total) by mouth 2 (two) times daily. What changed:  when to take this   potassium chloride SA 20 MEQ tablet Commonly known as:  K-DUR,KLOR-CON Take 1 tablet (20 mEq total) by mouth daily.   TRESIBA FLEXTOUCH 100 UNIT/ML Sopn Generic drug:  Insulin Degludec Inject 14 Units into the skin daily.       Follow-up Information    SHAH,ASHISH, MD. Schedule an appointment as soon as possible for a visit in 1 week(s).   Specialty:  Internal Medicine Contact information: [place] [address] [phone]        Candee Furbish, MD. Schedule an appointment as soon as possible for a visit in 3 week(s).   Specialty:  Cardiology Contact information: [id number] [place] [address] [phone]           Major procedures and Radiology Reports - PLEASE review detailed and final reports thoroughly  -        Dg Chest 2 View  Result Date: 10/03/2015 CLINICAL DATA:  Wheezing and chest pain EXAM: CHEST  2 VIEW COMPARISON:  July 19, 2015 FINDINGS: There is mild interstitial edema. There is no airspace consolidation or appreciable pleural effusion. There is cardiomegaly with pulmonary venous hypertension. There is atherosclerotic calcification in the aortic arch. No adenopathy evident. There is degenerative change in the thoracic spine. No pneumothorax. IMPRESSION: Evidence of a degree of  chronic congestive heart failure. No airspace consolidation. No pneumothorax. There is aortic atherosclerosis. Electronically Signed   By: Lowella Grip III M.D.   On: 10/03/2015 09:16    Micro Results    Recent Results (from the past 240 hour(s))  MRSA PCR Screening     Status: None   Collection Time: 10/03/15  3:09 PM  Result Value Ref  Range Status   MRSA by PCR NEGATIVE NEGATIVE Final    Comment:        The GeneXpert MRSA Assay (FDA approved for NASAL specimens only), is one component of a comprehensive MRSA colonization surveillance program. It is not intended to diagnose MRSA infection nor to guide or monitor treatment for MRSA infections.     Today   Subjective    Jody [name] today has no headache,no chest abdominal pain,no new weakness tingling or numbness, feels much better wants to go home today.    Objective   Blood pressure 136/72, pulse 83, temperature 98.6 F (37 C), temperature source Oral, resp. rate 16, height 5\' 7"  (1.702 m), weight 91.5 kg (201 lb 12.8 oz), SpO2 97 %.   Intake/Output Summary (Last 24 hours) at 10/05/15 1016 Last data filed at 10/05/15 1010  Gross per 24 hour  Intake             1125 ml  Output             2150 ml  Net            -1025 ml    Exam Awake Alert, Oriented x 3, No new F.N deficits, Normal affect [place].AT,PERRAL Supple Neck,No JVD, No cervical lymphadenopathy appriciated.  Symmetrical Chest wall movement, Good air movement bilaterally, CTAB RRR,No Gallops,Rubs or new Murmurs, No Parasternal Heave +ve B.Sounds, Abd Soft, Non tender, No organomegaly appriciated, No rebound -guarding or rigidity. No Cyanosis, Clubbing or edema, No new Rash or bruise   Data Review   CBC w Diff:  Lab Results  Component Value Date   WBC 7.9 10/05/2015   HGB 7.7 (L) 10/05/2015   HCT 25.8 (L) 10/05/2015   PLT 193 10/05/2015   LYMPHOPCT 14 07/19/2015   MONOPCT 8 07/19/2015   EOSPCT 4 07/19/2015   BASOPCT 0 07/19/2015    CMP:   Lab Results  Component Value Date   NA 139 10/05/2015   K 4.4 10/05/2015   CL 106 10/05/2015   CO2 22 10/05/2015   BUN 71 (H) 10/05/2015   CREATININE 2.71 (H) 10/05/2015   PROT 6.3 (L) 10/04/2015   ALBUMIN 3.1 (L) 10/04/2015   BILITOT 0.6 10/04/2015   ALKPHOS 113 10/04/2015   AST 39 10/04/2015   ALT 86 (H) 10/04/2015  .   Total Time in preparing paper work, data evaluation and todays exam - 35 minutes  Thurnell Lose M.D on 10/05/2015 at 10:16 AM  [facility]   Office  [phone]

## 2015-10-19 ENCOUNTER — Encounter: Payer: Self-pay | Admitting: Adult Health

## 2015-10-19 ENCOUNTER — Ambulatory Visit (INDEPENDENT_AMBULATORY_CARE_PROVIDER_SITE_OTHER): Payer: Medicare HMO | Admitting: Adult Health

## 2015-10-19 VITALS — BP 158/82 | HR 67 | Ht 67.0 in | Wt 210.0 lb

## 2015-10-19 DIAGNOSIS — J418 Mixed simple and mucopurulent chronic bronchitis: Secondary | ICD-10-CM | POA: Diagnosis not present

## 2015-10-19 DIAGNOSIS — I480 Paroxysmal atrial fibrillation: Secondary | ICD-10-CM | POA: Diagnosis not present

## 2015-10-19 DIAGNOSIS — I5032 Chronic diastolic (congestive) heart failure: Secondary | ICD-10-CM

## 2015-10-19 MED ORDER — DILTIAZEM HCL ER COATED BEADS 180 MG PO CP24: 180.0000 mg | ORAL_CAPSULE | Freq: Every day | ORAL | 3 refills | Status: DC

## 2015-10-19 MED ORDER — POTASSIUM CHLORIDE CRYS ER 20 MEQ PO TBCR
20.0000 meq | EXTENDED_RELEASE_TABLET | Freq: Every day | ORAL | 3 refills | Status: DC
Start: 2015-10-19 — End: 2016-10-16

## 2015-10-19 MED ORDER — ELIQUIS 2.5 MG PO TABS: 2.5000 mg | ORAL_TABLET | Freq: Two times a day (BID) | ORAL | 3 refills | Status: DC

## 2015-10-19 MED ORDER — FUROSEMIDE 40 MG PO TABS: 40.0000 mg | ORAL_TABLET | Freq: Two times a day (BID) | ORAL | 3 refills | Status: DC

## 2015-10-19 NOTE — Progress Notes (Signed)
Cardiology Office Note   Date:  10/19/2015   ID:  Jody [name], DOB May 13, 1940, MRN [id number]  PCP:  Monico Blitz, MD  Cardiologist: Woodroe Chen, NP   No chief complaint on file.     History of Present Illness: Jody [name] is a 75 y.o. female who presents for ongoing assessment and management of atrial fibrillation, chronic diastolic heart failure, with history of chronic dyspnea, hypertension, hypercholesterolemia, type 2 diabetes,dypsnea on exertion-COPD, and lower extremity edema.   The patient was last seen by Dr. Bronson Ing on 09/02/2015. She was continued on diltiazem and low-dose ELIQUIS. No changes were made in her medication regimen, she was advised to take an extra 40 mg of Lasix for increasing leg swelling as needed.  She is here today with complaints of GERD symptoms and wheezing, and chronic back pain. She is medically compliant. She ran out of her ELIQUIS 3 days ago. Was due to pick it up from the pharmacy today. She denies cardiac symptoms.  Past Medical History:  Diagnosis Date  . Anemia in chronic kidney disease (CODE) ([place]) 06/05/2015  . Arthritis    "bad in my legs" (12/07/2014)  . Atrial fibrillation ([place]) 06/05/2015  . Chronic back pain    "dr said my spine is crooked"  . Chronic bronchitis ([place])    "get it q yr" (12/07/2014)  . CKD stage 4 due to type 2 diabetes mellitus ([place]) 06/05/2015  . COPD (chronic obstructive pulmonary disease) ([place])   . Gait difficulty 09/02/2014  . Heart disease   . Hypercholesterolemia   . Hypertension   . Stroke ([place]) 05/2014   denies residual on 12/07/2014  . Stroke with cerebral ischemia ([place])   . Type II diabetes mellitus ([place])     Past Surgical History:  Procedure Laterality Date  . ABDOMINAL HYSTERECTOMY  ~ 1970  . APPENDECTOMY  ~ 1970  . JOINT REPLACEMENT    . TOTAL KNEE ARTHROPLASTY Right ~ 1986  . TUMOR EXCISION  ~ 1970   "9# in my stomach"     Current Outpatient Prescriptions  Medication  Sig Dispense Refill  . albuterol (PROVENTIL HFA;VENTOLIN HFA) 108 (90 BASE) MCG/ACT inhaler Inhale 1-2 puffs into the lungs every 6 (six) hours as needed for wheezing or shortness of breath. Reported on 03/01/2015    . aspirin EC 81 MG tablet Take 81 mg by mouth daily.    [name] diltiazem (CARDIZEM CD) 180 MG 24 hr capsule Take 1 capsule (180 mg total) by mouth daily. 30 capsule 3  . ELIQUIS 2.5 MG TABS tablet Take 1 tablet (2.5 mg total) by mouth 2 (two) times daily. 60 tablet 3  . fluticasone-salmeterol (ADVAIR HFA) 230-21 MCG/ACT inhaler Inhale 2 puffs into the lungs 2 (two) times daily.    . furosemide (LASIX) 40 MG tablet Take 1 tablet (40 mg total) by mouth 2 (two) times daily. 60 tablet 3  . Insulin Degludec (TRESIBA FLEXTOUCH) 100 UNIT/ML SOPN Inject 14 Units into the skin daily.     . potassium chloride SA (K-DUR,KLOR-CON) 20 MEQ tablet Take 1 tablet (20 mEq total) by mouth daily. 60 tablet 3   No current facility-administered medications for this visit.     Allergies:   Codeine    Social History:  The patient  reports that she quit smoking about 2 years ago. Her smoking use included Cigarettes. She started smoking about 62 years ago. She has a 50.00 pack-year smoking history. She has never used smokeless tobacco. She reports that she  drinks alcohol. She reports that she does not use drugs.   Family History:  The patient's family history includes Cancer in her mother and other.    ROS: All other systems are reviewed and negative. Unless otherwise mentioned in H&P    PHYSICAL EXAM: VS:  BP (!) 158/82   Pulse 67   Ht 5\' 7"  (1.702 m)   Wt 210 lb (95.3 kg)   SpO2 98%   BMI 32.89 kg/m  , BMI Body mass index is 32.89 kg/m. GEN: Well nourished, well developed, in no acute distress  HEENT: normal  Neck: no JVD, carotid bruits, or masses Cardiac: RRR; no murmurs, rubs, or gallops,no edema  Respiratory:  Bilateral soft crackles without wheezes or coughing. GI: soft, nontender,  nondistended, + BS MS: no deformity or atrophy  Skin: warm and dry, no rash Neuro:  Strength and sensation are intact Psych: euthymic mood, full affect  Recent Labs: 10/03/2015: B Natriuretic Peptide 1,244.0; TSH 2.217 10/04/2015: ALT 86 10/05/2015: BUN 71; Creatinine, Ser 2.71; Hemoglobin 7.7; Platelets 193; Potassium 4.4; Sodium 139    Lipid Panel    Component Value Date/Time   CHOL 156 10/04/2015 0301   TRIG 51 10/04/2015 0301   HDL 54 10/04/2015 0301   CHOLHDL 2.9 10/04/2015 0301   VLDL 10 10/04/2015 0301   LDLCALC 92 10/04/2015 0301      Wt Readings from Last 3 Encounters:  10/19/15 210 lb (95.3 kg)  10/05/15 201 lb 12.8 oz (91.5 kg)  09/02/15 206 lb (93.4 kg)     ASSESSMENT AND PLAN:  1. Atrial fibrillation with RVR: Heart rate is well controlled and regular at this time. She will continue ELIQUIS 2.5 mg twice a day. Samples are provided for her and she has run out. She is advised to fill her prescriptions on a timely manner to avoid lapses in dosing. I've also refilled her diltiazem 180 mg daily.  2. Chronic diastolic heart failure: I have refilled her Lasix. I'll also ask her to take potassium with each dose of Lasix to include the extra doses of potassium that she may need to take for lower extremity edema. Her weight is up about 9 pounds since last office visit. Review her labs revealed creatinine of 2.71 with potassium of 4.4.  3. COPD: Likely the cause of her dyspnea and wheezing. She will continue inhalers. Follow-up with primary care physician if symptoms worsen   Current medicines are reviewed at length with the patient today.    Labs/ tests ordered today include:  No orders of the defined types were placed in this encounter.    Disposition:   FU with 31months Signed, Jory Sims, NP  10/19/2015 4:39 PM    [place] [facility] [place] [address] [place],  [address] Phone: [phone]; Fax: [phone]

## 2015-10-19 NOTE — Progress Notes (Signed)
[name]    DOB: 10/19/1940  Age: 75 y.o.  MR#: [id number]       PCP:  Monico Blitz, MD      Insurance: Payor: AETNA MEDICARE / Plan: AETNA MEDICARE HMO/PPO / Product Type: *No Product type* /   CC:   No chief complaint on file.   VS Vitals:   10/19/15 1411  Weight: 210 lb (95.3 kg)  Height: 5\' 7"  (1.702 m)    Weights Current Weight  10/19/15 210 lb (95.3 kg)  10/05/15 201 lb 12.8 oz (91.5 kg)  09/02/15 206 lb (93.4 kg)    Blood Pressure  BP Readings from Last 3 Encounters:  10/05/15 136/72  09/02/15 133/74  07/22/15 114/72     Admit date:  (Not on file) Last encounter with RMR:  Visit date not found   Allergy Codeine  Current Outpatient Prescriptions  Medication Sig Dispense Refill  . albuterol (PROVENTIL HFA;VENTOLIN HFA) 108 (90 BASE) MCG/ACT inhaler Inhale 1-2 puffs into the lungs every 6 (six) hours as needed for wheezing or shortness of breath. Reported on 03/01/2015    . aspirin EC 81 MG tablet Take 81 mg by mouth daily.    [name] diltiazem (CARDIZEM CD) 180 MG 24 hr capsule Take 1 capsule (180 mg total) by mouth daily. 30 capsule 0  . ELIQUIS 2.5 MG TABS tablet Take 1 tablet by mouth 2 (two) times daily.  0  . fluticasone-salmeterol (ADVAIR HFA) 230-21 MCG/ACT inhaler Inhale 2 puffs into the lungs 2 (two) times daily.    . furosemide (LASIX) 40 MG tablet Take 1 tablet (40 mg total) by mouth 2 (two) times daily. 60 tablet 0  . Insulin Degludec (TRESIBA FLEXTOUCH) 100 UNIT/ML SOPN Inject 14 Units into the skin daily.     . potassium chloride SA (K-DUR,KLOR-CON) 20 MEQ tablet Take 1 tablet (20 mEq total) by mouth daily. 30 tablet 0   No current facility-administered medications for this visit.     Discontinued Meds:   There are no discontinued medications.  Patient Active Problem List   Diagnosis Date Noted  . NSTEMI (non-ST elevated myocardial infarction) ([place]) 10/03/2015  . Elevated LFTs 10/03/2015  . Encounter for therapeutic drug monitoring 07/29/2015   . COPD exacerbation ([place])   . COPD (chronic obstructive pulmonary disease) ([place]) 06/05/2015  . CKD stage 4 due to type 2 diabetes mellitus ([place]) 06/05/2015  . Anemia in chronic kidney disease (CODE) ([place]) 06/05/2015  . Acute on chronic diastolic CHF (congestive heart failure) ([place]) 06/05/2015  . Atrial fibrillation ([place]) 06/05/2015  . CHF exacerbation ([place]) 06/05/2015  . Atrial fibrillation with RVR ([place]) 12/08/2014  . Atrial fibrillation with rapid ventricular response ([place]) 12/07/2014  . TIA (transient ischemic attack) 09/02/2014  . Gait difficulty 09/02/2014  . Other specified transient cerebral ischemias   . Stroke with cerebral ischemia ([place])   . Hemispheric carotid artery syndrome   . Essential hypertension   . Hyperlipidemia   . Type 2 diabetes mellitus with other circulatory complications ([place])   . CVA (cerebral infarction) 05/27/2014    LABS    Component Value Date/Time   NA 139 10/05/2015 0340   NA 138 10/04/2015 0301   NA 141 10/03/2015 0858   K 4.4 10/05/2015 0340   K 4.8 10/04/2015 0301   K 4.3 10/03/2015 0858   CL 106 10/05/2015 0340   CL 109 10/04/2015 0301   CL 112 (H) 10/03/2015 0858   CO2 22 10/05/2015 0340   CO2 22 10/04/2015 0301  CO2 23 10/03/2015 0858   GLUCOSE 137 (H) 10/05/2015 0340   GLUCOSE 210 (H) 10/04/2015 0301   GLUCOSE 130 (H) 10/03/2015 0858   BUN 71 (H) 10/05/2015 0340   BUN 62 (H) 10/04/2015 0301   BUN 68 (H) 10/03/2015 0858   CREATININE 2.71 (H) 10/05/2015 0340   CREATININE 2.43 (H) 10/04/2015 0301   CREATININE 2.43 (H) 10/03/2015 0858   CALCIUM 9.1 10/05/2015 0340   CALCIUM 8.8 (L) 10/04/2015 0301   CALCIUM 8.6 (L) 10/03/2015 0858   GFRNONAA 16 (L) 10/05/2015 0340   GFRNONAA 18 (L) 10/04/2015 0301   GFRNONAA 18 (L) 10/03/2015 0858   GFRAA 18 (L) 10/05/2015 0340   GFRAA 21 (L) 10/04/2015 0301   GFRAA 21 (L) 10/03/2015 0858   CMP     Component Value Date/Time   NA 139 10/05/2015 0340   K 4.4 10/05/2015 0340   CL 106  10/05/2015 0340   CO2 22 10/05/2015 0340   GLUCOSE 137 (H) 10/05/2015 0340   BUN 71 (H) 10/05/2015 0340   CREATININE 2.71 (H) 10/05/2015 0340   CALCIUM 9.1 10/05/2015 0340   PROT 6.3 (L) 10/04/2015 0301   ALBUMIN 3.1 (L) 10/04/2015 0301   AST 39 10/04/2015 0301   ALT 86 (H) 10/04/2015 0301   ALKPHOS 113 10/04/2015 0301   BILITOT 0.6 10/04/2015 0301   GFRNONAA 16 (L) 10/05/2015 0340   GFRAA 18 (L) 10/05/2015 0340       Component Value Date/Time   WBC 7.9 10/05/2015 0340   WBC 6.7 10/04/2015 0301   WBC 6.4 10/03/2015 0858   HGB 7.7 (L) 10/05/2015 0340   HGB 7.6 (L) 10/04/2015 0301   HGB 8.4 (L) 10/03/2015 0858   HCT 25.8 (L) 10/05/2015 0340   HCT 25.2 (L) 10/04/2015 0301   HCT 26.7 (L) 10/03/2015 0858   MCV 97.0 10/05/2015 0340   MCV 96.6 10/04/2015 0301   MCV 95.7 10/03/2015 0858    Lipid Panel     Component Value Date/Time   CHOL 156 10/04/2015 0301   TRIG 51 10/04/2015 0301   HDL 54 10/04/2015 0301   CHOLHDL 2.9 10/04/2015 0301   VLDL 10 10/04/2015 0301   LDLCALC 92 10/04/2015 0301    ABG    Component Value Date/Time   TCO2 17 05/27/2014 1835     Lab Results  Component Value Date   TSH 2.217 10/03/2015   BNP (last 3 results)  Recent Labs  07/18/15 1500 07/19/15 1637 10/03/15 0858  BNP 1,056.0* 951.0* 1,244.0*    ProBNP (last 3 results) No results for input(s): PROBNP in the last 8760 hours.  Cardiac Panel (last 3 results) No results for input(s): CKTOTAL, CKMB, TROPONINI, RELINDX in the last 72 hours.  Iron/TIBC/Ferritin/ %Sat    Component Value Date/Time   IRON 39 10/03/2015 1207   TIBC 368 10/03/2015 1207   FERRITIN 52 12/07/2014 1650   IRONPCTSAT 11 10/03/2015 1207     EKG Orders placed or performed during the hospital encounter of 10/03/15  . ED EKG  . EKG 12-Lead  . EKG 12-Lead  . ED EKG  . EKG     Prior Assessment and Plan Problem List as of 10/19/2015 Reviewed: 10/03/2015 11:11 AM by Rexene Alberts, MD     Cardiovascular and  Mediastinum   Stroke with cerebral ischemia ([place])   Hemispheric carotid artery syndrome   Essential hypertension   Other specified transient cerebral ischemias   TIA (transient ischemic attack)   Atrial fibrillation with rapid ventricular response (  [place])   Atrial fibrillation with RVR (HCC)   Acute on chronic diastolic CHF (congestive heart failure) (HCC)   Atrial fibrillation (HCC)   CHF exacerbation (HCC)   NSTEMI (non-ST elevated myocardial infarction) (HCC)     Respiratory   COPD (chronic obstructive pulmonary disease) (HCC)   COPD exacerbation (HCC)     Endocrine   Type 2 diabetes mellitus with other circulatory complications (HCC)     Nervous and Auditory   CVA (cerebral infarction)     Genitourinary   CKD stage 4 due to type 2 diabetes mellitus ([place])     Other   Hyperlipidemia   Gait difficulty   Anemia in chronic kidney disease (CODE) ([place])   Encounter for therapeutic drug monitoring   Elevated LFTs       Imaging: Dg Chest 2 View  Result Date: 10/03/2015 CLINICAL DATA:  Wheezing and chest pain EXAM: CHEST  2 VIEW COMPARISON:  July 19, 2015 FINDINGS: There is mild interstitial edema. There is no airspace consolidation or appreciable pleural effusion. There is cardiomegaly with pulmonary venous hypertension. There is atherosclerotic calcification in the aortic arch. No adenopathy evident. There is degenerative change in the thoracic spine. No pneumothorax. IMPRESSION: Evidence of a degree of chronic congestive heart failure. No airspace consolidation. No pneumothorax. There is aortic atherosclerosis. Electronically Signed   By: Lowella Grip III M.D.   On: 10/03/2015 09:16

## 2015-10-19 NOTE — Patient Instructions (Signed)
Medication Instructions:  Your physician recommends that you continue on your current medications as directed. Please refer to the Current Medication list given to you today.   Labwork: none  Testing/Procedures: none  Follow-Up: Your physician recommends that you schedule a follow-up appointment in: 3 months    Any Other Special Instructions Will Be Listed Below (If Applicable).  I have sent in refills for cardiac medications.  If you need a refill on your cardiac medications before your next appointment, please call your pharmacy.

## 2016-02-28 ENCOUNTER — Encounter: Payer: Self-pay | Admitting: Cardiovascular Disease

## 2016-03-31 ENCOUNTER — Ambulatory Visit (INDEPENDENT_AMBULATORY_CARE_PROVIDER_SITE_OTHER): Payer: Medicare HMO | Admitting: Cardiovascular Disease

## 2016-03-31 ENCOUNTER — Encounter: Payer: Self-pay | Admitting: Cardiovascular Disease

## 2016-03-31 VITALS — BP 184/98 | HR 59 | Ht 67.0 in | Wt 208.2 lb

## 2016-03-31 DIAGNOSIS — I480 Paroxysmal atrial fibrillation: Secondary | ICD-10-CM

## 2016-03-31 DIAGNOSIS — I472 Ventricular tachycardia: Secondary | ICD-10-CM

## 2016-03-31 DIAGNOSIS — I5032 Chronic diastolic (congestive) heart failure: Secondary | ICD-10-CM | POA: Diagnosis not present

## 2016-03-31 DIAGNOSIS — D649 Anemia, unspecified: Secondary | ICD-10-CM | POA: Diagnosis not present

## 2016-03-31 DIAGNOSIS — I4729 Other ventricular tachycardia: Secondary | ICD-10-CM

## 2016-03-31 DIAGNOSIS — R931 Abnormal findings on diagnostic imaging of heart and coronary circulation: Secondary | ICD-10-CM

## 2016-03-31 DIAGNOSIS — I10 Essential (primary) hypertension: Secondary | ICD-10-CM

## 2016-03-31 MED ORDER — HYDRALAZINE HCL 25 MG PO TABS: 25.0000 mg | ORAL_TABLET | Freq: Three times a day (TID) | ORAL | 3 refills | Status: DC

## 2016-03-31 NOTE — Patient Instructions (Signed)
Your physician recommends that you schedule a follow-up appointment in: [place]  Your physician has recommended you make the following change in your medication:   START HYDRALAZINE 25 [place]   Your physician recommends that you return for lab work CBC   Thank you for choosing [facility]!!

## 2016-03-31 NOTE — Progress Notes (Signed)
SUBJECTIVE: The patient presents for follow-up of chronic diastolic heart failure, permanent atrial fibrillation, and hypertension. She was hospitalized for acute on chronic diastolic heart failure in June and August 2017.  Her blood pressure has been elevated for the past 2 or 3 days. She says she does not eat salt. She lives by herself. She uses a walker to get around. She has occasional chest pains when lying down and says "it feels like gas ". She has had no chest pain for the past 2 days. She denies dysphagia for solids or liquids. She has exertional dyspnea with minimal exertion. She has COPD. She also has anemia related to chronic kidney disease as per records.     Review of Systems: As per "subjective", otherwise negative.  Allergies  Allergen Reactions  . Codeine Nausea And Vomiting    Current Outpatient Prescriptions  Medication Sig Dispense Refill  . albuterol (PROVENTIL HFA;VENTOLIN HFA) 108 (90 BASE) MCG/ACT inhaler Inhale 1-2 puffs into the lungs every 6 (six) hours as needed for wheezing or shortness of breath. Reported on 03/01/2015    . aspirin EC 81 MG tablet Take 81 mg by mouth daily.    [name] atorvastatin (LIPITOR) 40 MG tablet Take 1 tablet by mouth daily.  0  . diltiazem (CARDIZEM CD) 180 MG 24 hr capsule Take 1 capsule (180 mg total) by mouth daily. 30 capsule 3  . ELIQUIS 2.5 MG TABS tablet Take 1 tablet (2.5 mg total) by mouth 2 (two) times daily. 60 tablet 3  . fluticasone-salmeterol (ADVAIR HFA) 230-21 MCG/ACT inhaler Inhale 2 puffs into the lungs 2 (two) times daily.    . furosemide (LASIX) 40 MG tablet Take 1 tablet (40 mg total) by mouth 2 (two) times daily. 60 tablet 3  . gabapentin (NEURONTIN) 300 MG capsule Take 1 capsule by mouth daily.  0  . Insulin Degludec (TRESIBA FLEXTOUCH) 100 UNIT/ML SOPN Inject 14 Units into the skin daily.     . potassium chloride SA (K-DUR,KLOR-CON) 20 MEQ tablet Take 1 tablet (20 mEq total) by mouth daily. 60 tablet 3   No  current facility-administered medications for this visit.     Past Medical History:  Diagnosis Date  . Anemia in chronic kidney disease (CODE) 06/05/2015  . Arthritis    "bad in my legs" (12/07/2014)  . Atrial fibrillation ([place]) 06/05/2015  . Chronic back pain    "dr said my spine is crooked"  . Chronic bronchitis ([place])    "get it q yr" (12/07/2014)  . CKD stage 4 due to type 2 diabetes mellitus ([place]) 06/05/2015  . COPD (chronic obstructive pulmonary disease) ([place])   . Gait difficulty 09/02/2014  . Heart disease   . Hypercholesterolemia   . Hypertension   . Stroke ([place]) 05/2014   denies residual on 12/07/2014  . Stroke with cerebral ischemia ([place])   . Type II diabetes mellitus ([place])     Past Surgical History:  Procedure Laterality Date  . ABDOMINAL HYSTERECTOMY  ~ 1970  . APPENDECTOMY  ~ 1970  . JOINT REPLACEMENT    . TOTAL KNEE ARTHROPLASTY Right ~ 1986  . TUMOR EXCISION  ~ 1970   "9# in my stomach"    Social History   Social History  . Marital status: Widowed    Spouse name: N/A  . Number of children: 0  . Years of education: 9   Occupational History  . retired    Social History Main Topics  . Smoking  status: Former Smoker    Packs/day: 1.00    Years: 50.00    Types: Cigarettes    Start date: 03/07/1953    Quit date: 01/13/2013  . Smokeless tobacco: Never Used     Comment: "quit smoking cigarettes  in ~ 2005"  . Alcohol use 0.0 oz/week     Comment: 10/242016 "quit drinking beer in ~ 1977"  . Drug use: No  . Sexual activity: No   Other Topics Concern  . Not on file   Social History Narrative   Patient drinks caffeine a few times a week.   Patient is right handed.     Vitals:   03/31/16 1028  BP: (!) 184/98  Pulse: (!) 59  SpO2: 94%  Weight: 208 lb 3.2 oz (94.4 kg)  Height: 5\' 7"  (1.702 m)    PHYSICAL EXAM General: NAD HEENT: Normal. Neck: No JVD, no thyromegaly. Lungs: Clear to auscultation bilaterally with normal respiratory effort. CV:  Nondisplaced PMI.  Regular rate and irregular rhythm, normal S1/S2, no S3, no murmur. No pretibial edema.     Abdomen: Soft, nontender, no distention.  Neurologic: Alert and oriented.  Psych: Normal affect. Skin: Normal. Musculoskeletal: No gross deformities.    ECG: Most recent ECG reviewed.      ASSESSMENT AND PLAN: 1. Atrial fibrillation: Stable on long-acting diltiazem and low-dose Eliquis (due to anemia, Hgb 7.7 on 10/05/15).   2. Chronic diastolic heart failure: Euvolemic on Lasix 40 mg bid. Aim to control BP.  3. Essential HTN: Markedly elevated. Will start hydralazine 25 mg tid.  4. Non-sustained ventricular tachycardia: No Coreg or other beta blockers due to COPD. Continue diltiazem. No ischemia on stress testing 11/2014.  5. Abnormal nuclear stress test October 2016: Probable artifact based on normal regional wall motion. Will monitor. Continue ASA 81 mg and statin.  6. Anemia due to CKD: Will obtain CBC. May benefit from erythropoietin. Consider hematology referral.  Dispo: f/u 1 month.   Kate Sable, M.D., F.A.C.C.

## 2016-04-04 ENCOUNTER — Telehealth: Payer: Self-pay | Admitting: *Deleted

## 2016-04-04 DIAGNOSIS — N189 Chronic kidney disease, unspecified: Principal | ICD-10-CM

## 2016-04-04 DIAGNOSIS — D631 Anemia in chronic kidney disease: Secondary | ICD-10-CM

## 2016-04-04 NOTE — Telephone Encounter (Signed)
Notes Recorded by Laurine Blazer, LPN on 2/64/1583 at 09:40 AM EST Patient notified. She agrees to Hematology referral. Already has 1 month f/u for 05/08/2016 with Dr. Bronson Ing. ------  Notes Recorded by Herminio Commons, MD on 04/03/2016 at 8:57 AM EST Anemic. Likely related to CKD. May benefit from erythropoietin as she is quite symptomatic. Please make referral to hematology at [facility].

## 2016-04-25 ENCOUNTER — Encounter (HOSPITAL_COMMUNITY): Payer: Medicare HMO

## 2016-04-25 ENCOUNTER — Encounter (HOSPITAL_COMMUNITY): Payer: Medicare HMO | Attending: Oncology | Admitting: Oncology

## 2016-04-25 ENCOUNTER — Encounter (HOSPITAL_COMMUNITY): Payer: Self-pay | Admitting: Oncology

## 2016-04-25 VITALS — BP 168/86 | HR 59 | Temp 97.8°F | Resp 20 | Ht 67.0 in | Wt 205.0 lb

## 2016-04-25 DIAGNOSIS — R718 Other abnormality of red blood cells: Secondary | ICD-10-CM | POA: Diagnosis not present

## 2016-04-25 DIAGNOSIS — N189 Chronic kidney disease, unspecified: Secondary | ICD-10-CM | POA: Diagnosis not present

## 2016-04-25 DIAGNOSIS — D631 Anemia in chronic kidney disease: Secondary | ICD-10-CM

## 2016-04-25 DIAGNOSIS — N184 Chronic kidney disease, stage 4 (severe): Secondary | ICD-10-CM

## 2016-04-25 LAB — CBC WITH DIFFERENTIAL/PLATELET
BASOS ABS: 0 10*3/uL (ref 0.0–0.1)
Basophils Relative: 0 %
EOS PCT: 6 %
Eosinophils Absolute: 0.3 10*3/uL (ref 0.0–0.7)
HEMATOCRIT: 25 % — AB (ref 36.0–46.0)
Hemoglobin: 8.2 g/dL — ABNORMAL LOW (ref 12.0–15.0)
Lymphocytes Relative: 16 %
Lymphs Abs: 0.9 10*3/uL (ref 0.7–4.0)
MCH: 29.4 pg (ref 26.0–34.0)
MCHC: 32.8 g/dL (ref 30.0–36.0)
MCV: 89.6 fL (ref 78.0–100.0)
MONO ABS: 0.4 10*3/uL (ref 0.1–1.0)
Monocytes Relative: 7 %
NEUTROS PCT: 71 %
Neutro Abs: 4 10*3/uL (ref 1.7–7.7)
Platelets: 156 10*3/uL (ref 150–400)
RBC: 2.79 MIL/uL — AB (ref 3.87–5.11)
RDW: 17 % — ABNORMAL HIGH (ref 11.5–15.5)
WBC: 5.6 10*3/uL (ref 4.0–10.5)

## 2016-04-25 LAB — RETICULOCYTES
RBC.: 2.79 MIL/uL — ABNORMAL LOW (ref 3.87–5.11)
RETIC CT PCT: 3.9 % — AB (ref 0.4–3.1)
Retic Count, Absolute: 108.8 10*3/uL (ref 19.0–186.0)

## 2016-04-25 LAB — SEDIMENTATION RATE: Sed Rate: 124 mm/hr — ABNORMAL HIGH (ref 0–22)

## 2016-04-25 LAB — IRON AND TIBC
IRON: 56 ug/dL (ref 28–170)
SATURATION RATIOS: 17 % (ref 10.4–31.8)
TIBC: 337 ug/dL (ref 250–450)
UIBC: 281 ug/dL

## 2016-04-25 LAB — COMPREHENSIVE METABOLIC PANEL
ALBUMIN: 3.5 g/dL (ref 3.5–5.0)
ALT: 24 U/L (ref 14–54)
AST: 25 U/L (ref 15–41)
Alkaline Phosphatase: 89 U/L (ref 38–126)
Anion gap: 9 (ref 5–15)
BUN: 61 mg/dL — AB (ref 6–20)
CHLORIDE: 108 mmol/L (ref 101–111)
CO2: 20 mmol/L — ABNORMAL LOW (ref 22–32)
CREATININE: 2.64 mg/dL — AB (ref 0.44–1.00)
Calcium: 8.7 mg/dL — ABNORMAL LOW (ref 8.9–10.3)
GFR calc Af Amer: 19 mL/min — ABNORMAL LOW (ref >60)
GFR, EST NON AFRICAN AMERICAN: 16 mL/min — AB (ref >60)
GLUCOSE: 101 mg/dL — AB (ref 65–99)
POTASSIUM: 4.7 mmol/L (ref 3.5–5.1)
Sodium: 137 mmol/L (ref 135–145)
Total Bilirubin: 0.7 mg/dL (ref 0.3–1.2)
Total Protein: 7.6 g/dL (ref 6.5–8.1)

## 2016-04-25 LAB — FERRITIN: FERRITIN: 84 ng/mL (ref 11–307)

## 2016-04-25 LAB — C-REACTIVE PROTEIN: CRP: <0.8 mg/dL (ref <1.0)

## 2016-04-25 LAB — VITAMIN B12: Vitamin B-12: 441 pg/mL (ref 180–914)

## 2016-04-25 LAB — LACTATE DEHYDROGENASE: LDH: 253 U/L — AB (ref 98–192)

## 2016-04-25 LAB — FOLATE: Folate: 16.2 ng/mL (ref >5.9)

## 2016-04-25 NOTE — Patient Instructions (Addendum)
[facility] at [facility] Discharge Instructions  RECOMMENDATIONS MADE BY THE CONSULTANT AND ANY TEST RESULTS WILL BE SENT TO YOUR REFERRING PHYSICIAN.  You were seen today by Kirby Crigler PA-C. Labs today, we will call with results. Return in 3 weeks for follow up.    Thank you for choosing [facility] to provide your oncology and hematology care.  To afford each patient quality time with our provider, please arrive at least 15 minutes before your scheduled appointment time.    If you have a lab appointment with the [facility] please come in thru the  Main Entrance and check in at the main information desk  You need to re-schedule your appointment should you arrive 10 or more minutes late.  We strive to give you quality time with our providers, and arriving late affects you and other patients whose appointments are after yours.  Also, if you no show three or more times for appointments you may be dismissed from the clinic at the providers discretion.     Again, thank you for choosing [facility].  Our hope is that these requests will decrease the amount of time that you wait before being seen by our physicians.       _____________________________________________________________  Should you have questions after your visit to [facility], please contact our office at [phone] between the hours of 8:30 a.m. and 4:30 p.m.  Voicemails left after 4:30 p.m. will not be returned until the following business day.  For prescription refill requests, have your pharmacy contact our office.       Resources For Cancer Patients and their Caregivers ? American Cancer Society: Can assist with transportation, wigs, general needs, runs Look Good Feel Better.        281-630-8818 ? Cancer Care: Provides financial assistance, online support groups, medication/co-pay assistance.  1-800-813-HOPE 226-802-7829) ? [place] Assists [place] cancer patients and their families through emotional , educational and financial support.  478-152-5711 ? [place] DSS Where to apply for food stamps, Medicaid and utility assistance. 780 678 7440 ? [facility]: Transportation to medical appointments. 208-802-5581 ? Social Security Administration: May apply for disability if have a Stage IV cancer. (631)791-6397 (307)125-7215 ? [employer], Disability and Transit Services: Assists with nutrition, care and transit needs. [place] Support Programs: @10RELATIVEDAYS @ > Cancer Support Group  2nd Tuesday of the month 1pm-2pm, Journey Room  > Creative Journey  3rd Tuesday of the month 1130am-1pm, Journey Room  > Look Good Feel Better  1st Wednesday of the month 10am-12 noon, Journey Room (Call [facility] to register [phone])

## 2016-04-25 NOTE — Assessment & Plan Note (Addendum)
Normocytic, normochromic anemia with elevated RDW and preservation of WBC and platelet count in the setting of Stage IV chronic renal disease.  Anemia presumed to be secondary to chronic renal disease.  Labs today: CBC diff, CMET, LDH, ESR, CRP, anemia panel, haptoglobin, retic count, EPO level, SPEP+IFE, light chain assay, hemoglobinopathy evaluation, pathologist smear review.  If peripheral anemia work-up is negative, then ESA therapy with Aranesp at renal dosing, 0.75 mcg/kg, every 2 weeks  Prior to initiation of ESA therapy, iron studies will have to be appropriate with ferritin > 100.  If significant iron deficiency is note, then we will pursue stool cards x 3 +/- referral to GI.  Return in 3-4 weeks for follow-up and initiation of ESA therapy if indicated.  In the interim, if iron studies require correcting, will provide IV iron therapy (she is a candidate for ferric gluconate given her chronic renal disease).

## 2016-04-25 NOTE — Progress Notes (Signed)
[facility] Hematology/Oncology Consultation   [name]      MRN: [id number]    Location: Room/bed info not found  Date: 04/25/2016 Time:12:02 PM   REFERRING PHYSICIAN:  Kate Sable, MD (Cardiology)  REASON FOR CONSULT:  Anemia due to chronic kidney disease   DIAGNOSIS:  Normocytic, normochromic anemia dating back to at least 2009 with elevated RDW and preservation of WBC and platelet count in the setting of Stage IV chronic renal disease  HISTORY OF PRESENT ILLNESS:   Jody [name] is a 76 y.o. female with a medical history significant for CHF, A-fibrillation with RVR anticoagulated with Eliquis, chronic renal disease, Stage IV, COPD, H/O CVA, HTN, hyperlipidemia, H/O NSTEMI, type 2 DM who is referred to the [facility] for anemia evaluation and management.  She reports that she has seen Fran Lowes, MD (Nephrology) in the past.  She remembers being told that she will need hemodialysis in the future, but the patient, even at this time, is resistant to this intervention.  She reports her last mammogram being 1-2 years ago.  Her last colonoscopy was "years ago" and performed by Dr. Rowe Pavy.    She reports difficulty with walking secondary to peripheral neuropathy.  Peripheral neuropathy secondary to her diabetes.  She notes that her diabetes has been well controlled recently.  She denies any blood in her stools or dark tarry stools.  She denies any gross hematuria, epistaxis, hematemesis, gingival bleeding, and other sources of bleeding.  She denies any abdominal pain.  She denies any chest pain.  She does admit to intermittent shortness of breath mostly with exertion.  She does have COPD and has an inhaler.  She provide education regarding anemia.  Provide education regarding the role of hemoglobin.  She knows that bone marrow is a Patent examiner for her blood cells.  She is educated regarding the negative feedback loop for  hematopoesis and the role the kidneys, specifically erythropoietin, plan the role of this mechanism.  She is advised that despite our intervention, this will not improve her renal function.  Review of Systems  Constitutional: Positive for malaise/fatigue. Negative for chills, fever and weight loss.  HENT: Negative.  Negative for nosebleeds.   Eyes: Negative.   Respiratory: Positive for shortness of breath. Negative for cough, hemoptysis and sputum production.   Cardiovascular: Negative.  Negative for chest pain.  Gastrointestinal: Negative.  Negative for blood in stool, constipation, diarrhea, melena, nausea and vomiting.  Genitourinary: Negative.  Negative for hematuria.  Musculoskeletal: Positive for joint pain (hands bilaterally).  Skin: Negative.   Neurological: Positive for weakness.  Endo/Heme/Allergies: Negative.   Psychiatric/Behavioral: Negative.      PAST MEDICAL HISTORY:   Past Medical History:  Diagnosis Date  . Anemia in chronic kidney disease (CODE) 06/05/2015  . Arthritis    "bad in my legs" (12/07/2014)  . Atrial fibrillation ([place]) 06/05/2015  . Chronic back pain    "dr said my spine is crooked"  . Chronic bronchitis ([place])    "get it q yr" (12/07/2014)  . CKD stage 4 due to type 2 diabetes mellitus ([place]) 06/05/2015  . COPD (chronic obstructive pulmonary disease) ([place])   . Gait difficulty 09/02/2014  . Heart disease   . Hypercholesterolemia   . Hypertension   . Stroke ([place]) 05/2014   denies residual on 12/07/2014  . Stroke with cerebral ischemia ([place])   . Type II diabetes mellitus ([place])     ALLERGIES:  Allergies  Allergen Reactions  . Codeine Nausea And Vomiting      MEDICATIONS: I have reviewed the patient's current medications.    Current Outpatient Prescriptions on File Prior to Visit  Medication Sig Dispense Refill  . albuterol (PROVENTIL HFA;VENTOLIN HFA) 108 (90 BASE) MCG/ACT inhaler Inhale 1-2 puffs into the lungs every 6 (six) hours as needed for  wheezing or shortness of breath. Reported on 03/01/2015    . aspirin EC 81 MG tablet Take 81 mg by mouth daily.    [name] atorvastatin (LIPITOR) 40 MG tablet Take 1 tablet by mouth daily.  0  . diltiazem (CARDIZEM CD) 180 MG 24 hr capsule Take 1 capsule (180 mg total) by mouth daily. 30 capsule 3  . ELIQUIS 2.5 MG TABS tablet Take 1 tablet (2.5 mg total) by mouth 2 (two) times daily. 60 tablet 3  . fluticasone-salmeterol (ADVAIR HFA) 230-21 MCG/ACT inhaler Inhale 2 puffs into the lungs 2 (two) times daily.    . furosemide (LASIX) 40 MG tablet Take 1 tablet (40 mg total) by mouth 2 (two) times daily. 60 tablet 3  . gabapentin (NEURONTIN) 300 MG capsule Take 1 capsule by mouth daily.  0  . hydrALAZINE (APRESOLINE) 25 MG tablet Take 1 tablet (25 mg total) by mouth 3 (three) times daily. 90 tablet 3  . Insulin Degludec (TRESIBA FLEXTOUCH) 100 UNIT/ML SOPN Inject 14 Units into the skin daily.     . potassium chloride SA (K-DUR,KLOR-CON) 20 MEQ tablet Take 1 tablet (20 mEq total) by mouth daily. 60 tablet 3   No current facility-administered medications on file prior to visit.      PAST SURGICAL HISTORY Past Surgical History:  Procedure Laterality Date  . ABDOMINAL HYSTERECTOMY  ~ 1970  . APPENDECTOMY  ~ 1970  . JOINT REPLACEMENT    . TOTAL KNEE ARTHROPLASTY Right ~ 1986  . TUMOR EXCISION  ~ 1970   "9# in my stomach"    FAMILY HISTORY: Family History  Problem Relation Age of Onset  . Cancer Other   . Heart attack Brother   . Cancer Sister     breast  . Alcohol abuse Brother    Mother deceased in her 56s secondary to "she just got sick and died." Father deceased in his 39s from motor vehicle accident. One sister alive at the age of 54. 2 sisters deceased, one at the age of 73 secondary to myocardial infarction and another sister died in her 20s secondary to breast cancer. 2 brothers deceased, one from alcoholism and another one from myocardial infarction. No children.  SOCIAL HISTORY:   reports that she quit smoking about 12 years ago. Her smoking use included Cigarettes. She started smoking about 63 years ago. She has a 25.00 pack-year smoking history. She has never used smokeless tobacco. She reports that she does not drink alcohol or use drugs.  She reports a history of alcohol abuse but quit many years ago, in 1977.  She denies any illicit drug abuse.  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   Social History   Social History  . Marital status: Widowed    Spouse name: N/A  . Number of children: 0  . Years of education: 9   Occupational History  . retired    Social History Main Topics  . Smoking status: Former Smoker    Packs/day: 0.50    Years: 50.00  Types: Cigarettes    Start date: 03/07/1953    Quit date: 01/14/2004  . Smokeless tobacco: Never Used     Comment: "quit smoking cigarettes  in ~ 2005"  . Alcohol use No     Comment: 10/242016 "quit drinking beer in ~ 1977"  . Drug use: No  . Sexual activity: No   Other Topics Concern  . None   Social History Narrative   Patient drinks caffeine a few times a week.   Patient is right handed.    PERFORMANCE STATUS: The patient's performance status is 2 - Symptomatic, <50% confined to bed  PHYSICAL EXAM: Most Recent Vital Signs: Blood pressure (!) 168/86, pulse (!) 59, temperature 97.8 F (36.6 C), temperature source Oral, resp. rate 20, height '5\' 7"'$  (1.702 m), weight 205 lb (93 kg), SpO2 100 %. BP (!) 168/86 (BP Location: Left Arm, Patient Position: Sitting)   Pulse (!) 59   Temp 97.8 F (36.6 C) (Oral)   Resp 20   Ht '5\' 7"'$  (1.702 m)   Wt 205 lb (93 kg)   SpO2 100%   BMI 32.11 kg/m   General Appearance:    Alert, cooperative, no distress, appears stated age, unaccompanied, obese, in wheelchair  Head:    Normocephalic, without obvious abnormality, atraumatic  Eyes:    Conjunctiva/corneas clear,  EOM's intact, both eyes  Ears:    Normal TM's and external ear canals, both ears  Nose:   Nares normal, mucosa normal, no drainage or sinus tenderness  Throat:   Lips, mucosa, and tongue normal; upper and lower dentures  Neck:   Supple, symmetrical, trachea midline, no adenopathy.  Back:     Symmetric, no curvature, ROM normal, no CVA tenderness  Lungs:     Clear to auscultation bilaterally, respirations unlabored  Chest Wall:    Not examined   Heart:    Irregularly irregular rate and rhythm, S1 and S2 normal, no murmur, rub or gallop  Breast Exam:    Not examined  Abdomen:     Soft, non-tender, bowel sounds active all four quadrants,    no masses, no organomegaly  Genitalia:    Not examined  Rectal:    Not examined  Extremities:   Extremities normal, atraumatic, no cyanosis.  Pulses:   Not examined  Skin:   Skin color, texture, turgor normal, no rashes or lesions  Lymph nodes:   Cervical, supraclavicular, and axillary nodes normal  Neurologic:   CNII-XII intact, normal strength, sensation and reflexes    throughout    LABORATORY DATA:  CBC    Component Value Date/Time   WBC 7.9 10/05/2015 0340   RBC 2.66 (L) 10/05/2015 0340   HGB 7.7 (L) 10/05/2015 0340   HCT 25.8 (L) 10/05/2015 0340   PLT 193 10/05/2015 0340   MCV 97.0 10/05/2015 0340   MCH 28.9 10/05/2015 0340   MCHC 29.8 (L) 10/05/2015 0340   RDW 16.7 (H) 10/05/2015 0340   LYMPHSABS 0.8 07/19/2015 1637   MONOABS 0.4 07/19/2015 1637   EOSABS 0.2 07/19/2015 1637   BASOSABS 0.0 07/19/2015 1637     Chemistry      Component Value Date/Time   NA 139 10/05/2015 0340   K 4.4 10/05/2015 0340   CL 106 10/05/2015 0340   CO2 22 10/05/2015 0340   BUN 71 (H) 10/05/2015 0340   CREATININE 2.71 (H) 10/05/2015 0340      Component Value Date/Time   CALCIUM 9.1 10/05/2015 0340   ALKPHOS 113 10/04/2015 0301  AST 39 10/04/2015 0301   ALT 86 (H) 10/04/2015 0301   BILITOT 0.6 10/04/2015 0301      RADIOGRAPHY: No results  found.     PATHOLOGY:  N/A  ASSESSMENT/PLAN:   Anemia in chronic kidney disease, Stage IV Normocytic, normochromic anemia with elevated RDW and preservation of WBC and platelet count in the setting of Stage IV chronic renal disease.  Anemia presumed to be secondary to chronic renal disease.  Labs today: CBC diff, CMET, LDH, ESR, CRP, anemia panel, haptoglobin, retic count, EPO level, SPEP+IFE, light chain assay, hemoglobinopathy evaluation, pathologist smear review.  If peripheral anemia work-up is negative, then ESA therapy with Aranesp at renal dosing, 0.75 mcg/kg, every 2 weeks  Prior to initiation of ESA therapy, iron studies will have to be appropriate with ferritin > 100.  If significant iron deficiency is note, then we will pursue stool cards x 3 +/- referral to GI.  Return in 3-4 weeks for follow-up and initiation of ESA therapy if indicated.  In the interim, if iron studies require correcting, will provide IV iron therapy (she is a candidate for ferric gluconate given her chronic renal disease).   ORDERS PLACED FOR THIS ENCOUNTER: Orders Placed This Encounter  Procedures  . CBC with Differential  . Comprehensive metabolic panel  . Lactate dehydrogenase  . Sedimentation rate  . Pathologist smear review  . C-reactive protein  . Kappa/lambda light chains  . IgG, IgA, IgM  . Immunofixation electrophoresis  . Protein electrophoresis, serum  . Vitamin B12  . Folate  . Iron and TIBC  . Ferritin  . Erythropoietin  . Haptoglobin  . Reticulocytes  . Hemoglobinopathy evaluation  . CBC with Differential  . Basic metabolic panel    MEDICATIONS PRESCRIBED THIS ENCOUNTER: No orders of the defined types were placed in this encounter.   All questions were answered. The patient knows to call the clinic with any problems, questions or concerns. We can certainly see the patient much sooner if necessary.  Patient discussed with Dr. Talbert Cage and together we ascertained an up-to-date  interval history, and examined the patient.  Dr. Talbert Cage developed the patient's assessment and plan.  This was a shared visit-consultation.  Her attestation will follow below.  This note is electronically signed by: Doy Mince 04/25/2016 12:02 PM

## 2016-04-26 ENCOUNTER — Other Ambulatory Visit (HOSPITAL_COMMUNITY): Payer: Self-pay | Admitting: Oncology

## 2016-04-26 LAB — IGG, IGA, IGM
IGM, SERUM: 134 mg/dL (ref 26–217)
IgA: 520 mg/dL — ABNORMAL HIGH (ref 64–422)
IgG (Immunoglobin G), Serum: 1691 mg/dL — ABNORMAL HIGH (ref 700–1600)

## 2016-04-26 LAB — HEMOGLOBINOPATHY EVALUATION
HGB A2 QUANT: 2.2 % (ref 1.8–3.2)
HGB C: 0 %
Hgb A: 97.8 % (ref 96.4–98.8)
Hgb F Quant: 0 % (ref 0.0–2.0)
Hgb S Quant: 0 %
Hgb Variant: 0 %

## 2016-04-26 LAB — ERYTHROPOIETIN: Erythropoietin: 32.5 m[IU]/mL — ABNORMAL HIGH (ref 2.6–18.5)

## 2016-04-26 LAB — HAPTOGLOBIN: HAPTOGLOBIN: 147 mg/dL (ref 34–200)

## 2016-04-27 LAB — PROTEIN ELECTROPHORESIS, SERUM
A/G Ratio: 0.8 (ref 0.7–1.7)
ALPHA-2-GLOBULIN: 0.8 g/dL (ref 0.4–1.0)
Albumin ELP: 3.3 g/dL (ref 2.9–4.4)
Alpha-1-Globulin: 0.2 g/dL (ref 0.0–0.4)
BETA GLOBULIN: 1 g/dL (ref 0.7–1.3)
GAMMA GLOBULIN: 1.9 g/dL — AB (ref 0.4–1.8)
Globulin, Total: 3.9 g/dL (ref 2.2–3.9)
Total Protein ELP: 7.2 g/dL (ref 6.0–8.5)

## 2016-04-27 LAB — PATHOLOGIST SMEAR REVIEW

## 2016-04-28 LAB — KAPPA/LAMBDA LIGHT CHAINS
KAPPA, LAMDA LIGHT CHAIN RATIO: 2.43 — AB (ref 0.26–1.65)
Kappa free light chain: 188.9 mg/L — ABNORMAL HIGH (ref 3.3–19.4)
LAMDA FREE LIGHT CHAINS: 77.7 mg/L — AB (ref 5.7–26.3)

## 2016-04-28 LAB — IMMUNOFIXATION ELECTROPHORESIS
IGA: 510 mg/dL — AB (ref 64–422)
IGG (IMMUNOGLOBIN G), SERUM: 1744 mg/dL — AB (ref 700–1600)
IgM, Serum: 143 mg/dL (ref 26–217)
Total Protein ELP: 7.1 g/dL (ref 6.0–8.5)

## 2016-05-08 ENCOUNTER — Encounter: Payer: Self-pay | Admitting: Cardiovascular Disease

## 2016-05-08 ENCOUNTER — Ambulatory Visit (INDEPENDENT_AMBULATORY_CARE_PROVIDER_SITE_OTHER): Payer: Medicare HMO | Admitting: Cardiovascular Disease

## 2016-05-08 VITALS — BP 197/82 | HR 51 | Ht 67.0 in | Wt 208.8 lb

## 2016-05-08 DIAGNOSIS — I10 Essential (primary) hypertension: Secondary | ICD-10-CM | POA: Diagnosis not present

## 2016-05-08 DIAGNOSIS — R931 Abnormal findings on diagnostic imaging of heart and coronary circulation: Secondary | ICD-10-CM | POA: Diagnosis not present

## 2016-05-08 DIAGNOSIS — N189 Chronic kidney disease, unspecified: Secondary | ICD-10-CM

## 2016-05-08 DIAGNOSIS — I5032 Chronic diastolic (congestive) heart failure: Secondary | ICD-10-CM | POA: Diagnosis not present

## 2016-05-08 DIAGNOSIS — D631 Anemia in chronic kidney disease: Secondary | ICD-10-CM

## 2016-05-08 DIAGNOSIS — I472 Ventricular tachycardia: Secondary | ICD-10-CM | POA: Diagnosis not present

## 2016-05-08 DIAGNOSIS — I4729 Other ventricular tachycardia: Secondary | ICD-10-CM

## 2016-05-08 DIAGNOSIS — I480 Paroxysmal atrial fibrillation: Secondary | ICD-10-CM

## 2016-05-08 MED ORDER — HYDRALAZINE HCL 50 MG PO TABS: 50.0000 mg | ORAL_TABLET | Freq: Three times a day (TID) | ORAL | 1 refills | Status: DC

## 2016-05-08 NOTE — Patient Instructions (Signed)
Your physician recommends that you schedule a follow-up appointment in: [place]  Your physician has recommended you make the following change in your medication:   INCREASE HYDRALAZINE 50 MG 3 TIMES DAILY   Thank you for choosing [facility]!!

## 2016-05-08 NOTE — Progress Notes (Signed)
SUBJECTIVE: The patient presents for follow-up of hypertension. I started hydralazine 25 mg 3 times a day at her last visit on 03/31/16. It is 197/82 today.  She has been having headaches. She denies chest pain. She says she does not use salt or use canned foods. She complains of hand and feet coldness.   Review of Systems: As per "subjective", otherwise negative.  Allergies  Allergen Reactions  . Codeine Nausea And Vomiting    Current Outpatient Prescriptions  Medication Sig Dispense Refill  . albuterol (PROVENTIL HFA;VENTOLIN HFA) 108 (90 BASE) MCG/ACT inhaler Inhale 1-2 puffs into the lungs every 6 (six) hours as needed for wheezing or shortness of breath. Reported on 03/01/2015    . aspirin EC 81 MG tablet Take 81 mg by mouth daily.    [name] atorvastatin (LIPITOR) 40 MG tablet Take 1 tablet by mouth daily.  0  . diltiazem (CARDIZEM CD) 180 MG 24 hr capsule Take 1 capsule (180 mg total) by mouth daily. 30 capsule 3  . ELIQUIS 2.5 MG TABS tablet Take 1 tablet (2.5 mg total) by mouth 2 (two) times daily. 60 tablet 3  . fluticasone-salmeterol (ADVAIR HFA) 230-21 MCG/ACT inhaler Inhale 2 puffs into the lungs 2 (two) times daily.    . furosemide (LASIX) 40 MG tablet Take 1 tablet (40 mg total) by mouth 2 (two) times daily. 60 tablet 3  . gabapentin (NEURONTIN) 300 MG capsule Take 1 capsule by mouth daily.  0  . hydrALAZINE (APRESOLINE) 25 MG tablet Take 1 tablet (25 mg total) by mouth 3 (three) times daily. 90 tablet 3  . Insulin Degludec (TRESIBA FLEXTOUCH) 100 UNIT/ML SOPN Inject 14 Units into the skin daily.     . potassium chloride SA (K-DUR,KLOR-CON) 20 MEQ tablet Take 1 tablet (20 mEq total) by mouth daily. 60 tablet 3   No current facility-administered medications for this visit.     Past Medical History:  Diagnosis Date  . Anemia in chronic kidney disease (CODE) 06/05/2015  . Arthritis    "bad in my legs" (12/07/2014)  . Atrial fibrillation ([place]) 06/05/2015  . Chronic back  pain    "dr said my spine is crooked"  . Chronic bronchitis ([place])    "get it q yr" (12/07/2014)  . CKD stage 4 due to type 2 diabetes mellitus ([place]) 06/05/2015  . COPD (chronic obstructive pulmonary disease) ([place])   . Gait difficulty 09/02/2014  . Heart disease   . Hypercholesterolemia   . Hypertension   . Stroke ([place]) 05/2014   denies residual on 12/07/2014  . Stroke with cerebral ischemia ([place])   . Type II diabetes mellitus ([place])     Past Surgical History:  Procedure Laterality Date  . ABDOMINAL HYSTERECTOMY  ~ 1970  . APPENDECTOMY  ~ 1970  . JOINT REPLACEMENT    . TOTAL KNEE ARTHROPLASTY Right ~ 1986  . TUMOR EXCISION  ~ 1970   "9# in my stomach"    Social History   Social History  . Marital status: Widowed    Spouse name: N/A  . Number of children: 0  . Years of education: 9   Occupational History  . retired    Social History Main Topics  . Smoking status: Former Smoker    Packs/day: 0.50    Years: 50.00    Types: Cigarettes    Start date: 03/07/1953    Quit date: 01/14/2004  . Smokeless tobacco: Never Used     Comment: "quit smoking  cigarettes  in ~ 2005"  . Alcohol use No     Comment: 10/242016 "quit drinking beer in ~ 1977"  . Drug use: No  . Sexual activity: No   Other Topics Concern  . Not on file   Social History Narrative   Patient drinks caffeine a few times a week.   Patient is right handed.     Vitals:   05/08/16 1117  BP: (!) 197/82  Pulse: (!) 51  SpO2: 99%  Weight: 208 lb 12.8 oz (94.7 kg)  Height: 5\' 7"  (1.702 m)    PHYSICAL EXAM General: NAD HEENT: Normal. Neck: No JVD, no thyromegaly. Lungs: Clear to auscultation bilaterally with normal respiratory effort. CV: Nondisplaced PMI.  Regular rate and rhythm, normal S1/S2, no S3/S4, no murmur. No pretibial or periankle edema.     Abdomen: Soft, nontender, no distention.  Neurologic: Alert and oriented.  Psych: Normal affect. Skin: Normal. Musculoskeletal: No gross  deformities.    ECG: Most recent ECG reviewed.      ASSESSMENT AND PLAN: 1. Atrial fibrillation: Stable on long-acting diltiazem and low-dose Eliquis (due to anemia, Hgb 8.2 on 04/25/16).   2. Chronic diastolic heart failure: Euvolemic on Lasix 40 mg bid. Aim to control BP.  3. Essential HTN: Markedly elevated. Will increase hydralazine to 50 mg tid.  4. Non-sustained ventricular tachycardia: No Coreg or other beta blockers due to COPD. Continue diltiazem. No ischemia on stress testing 11/2014.  5. Abnormal nuclear stress test October 2016: Probable artifact based on normal regional wall motion. Will monitor. Continue ASA 81 mg and statin.  6. Anemia due to CKD: I previously made a hematology referral and she is currently being managed by them.  Dispo: f/u 2 months  Kate Sable, M.D., F.A.C.C.

## 2016-05-16 ENCOUNTER — Telehealth (HOSPITAL_COMMUNITY): Payer: Self-pay | Admitting: Oncology

## 2016-05-16 NOTE — Telephone Encounter (Signed)
RCVD A CALL FROM [employer] STATING THE PTS PLAN TERMED ON 05/13/16. I CALLLED PTS NIECE [name] TO REQ UPDATED INFO. HAD TO LEAVE A VM

## 2016-05-24 ENCOUNTER — Encounter (HOSPITAL_COMMUNITY): Payer: Self-pay | Admitting: Adult Health

## 2016-05-24 ENCOUNTER — Encounter (HOSPITAL_COMMUNITY): Payer: Medicare Other | Attending: Oncology

## 2016-05-24 ENCOUNTER — Encounter (HOSPITAL_COMMUNITY): Payer: Medicare Other | Attending: Adult Health | Admitting: Adult Health

## 2016-05-24 ENCOUNTER — Encounter (HOSPITAL_COMMUNITY): Payer: Medicare Other

## 2016-05-24 VITALS — BP 181/68 | HR 53 | Temp 98.0°F | Resp 18 | Wt 211.2 lb

## 2016-05-24 VITALS — BP 138/54 | HR 58

## 2016-05-24 DIAGNOSIS — N184 Chronic kidney disease, stage 4 (severe): Secondary | ICD-10-CM

## 2016-05-24 DIAGNOSIS — D509 Iron deficiency anemia, unspecified: Secondary | ICD-10-CM | POA: Diagnosis not present

## 2016-05-24 DIAGNOSIS — N189 Chronic kidney disease, unspecified: Secondary | ICD-10-CM | POA: Diagnosis not present

## 2016-05-24 DIAGNOSIS — I10 Essential (primary) hypertension: Secondary | ICD-10-CM | POA: Diagnosis not present

## 2016-05-24 DIAGNOSIS — D631 Anemia in chronic kidney disease: Secondary | ICD-10-CM

## 2016-05-24 DIAGNOSIS — D890 Polyclonal hypergammaglobulinemia: Secondary | ICD-10-CM | POA: Diagnosis not present

## 2016-05-24 LAB — CBC WITH DIFFERENTIAL/PLATELET
BASOS PCT: 0 %
Basophils Absolute: 0 10*3/uL (ref 0.0–0.1)
EOS ABS: 0.4 10*3/uL (ref 0.0–0.7)
EOS PCT: 6 %
HCT: 27.3 % — ABNORMAL LOW (ref 36.0–46.0)
HEMOGLOBIN: 8.9 g/dL — AB (ref 12.0–15.0)
Lymphocytes Relative: 18 %
Lymphs Abs: 1.1 10*3/uL (ref 0.7–4.0)
MCH: 30 pg (ref 26.0–34.0)
MCHC: 32.6 g/dL (ref 30.0–36.0)
MCV: 91.9 fL (ref 78.0–100.0)
Monocytes Absolute: 0.4 10*3/uL (ref 0.1–1.0)
Monocytes Relative: 6 %
NEUTROS PCT: 70 %
Neutro Abs: 4.2 10*3/uL (ref 1.7–7.7)
PLATELETS: 147 10*3/uL — AB (ref 150–400)
RBC: 2.97 MIL/uL — AB (ref 3.87–5.11)
RDW: 16.9 % — ABNORMAL HIGH (ref 11.5–15.5)
WBC: 6 10*3/uL (ref 4.0–10.5)

## 2016-05-24 LAB — BASIC METABOLIC PANEL
Anion gap: 9 (ref 5–15)
BUN: 52 mg/dL — AB (ref 6–20)
CO2: 20 mmol/L — ABNORMAL LOW (ref 22–32)
CREATININE: 2.5 mg/dL — AB (ref 0.44–1.00)
Calcium: 9.2 mg/dL (ref 8.9–10.3)
Chloride: 110 mmol/L (ref 101–111)
GFR calc Af Amer: 20 mL/min — ABNORMAL LOW (ref >60)
GFR, EST NON AFRICAN AMERICAN: 17 mL/min — AB (ref >60)
Glucose, Bld: 100 mg/dL — ABNORMAL HIGH (ref 65–99)
Potassium: 5.1 mmol/L (ref 3.5–5.1)
SODIUM: 139 mmol/L (ref 135–145)

## 2016-05-24 MED ORDER — HYDRALAZINE HCL 20 MG/ML IJ SOLN
10.0000 mg | INTRAMUSCULAR | Status: AC
  Administered 2016-05-24: 10 mg via INTRAVENOUS
  Filled 2016-05-24: qty 0.5

## 2016-05-24 NOTE — Progress Notes (Addendum)
[place], [place] [address]   CLINIC:  Medical Oncology/Hematology  PCP:  Monico Blitz, MD [place] [address] [phone]   REASON FOR VISIT:  Follow-up for anemia in the setting of chronic kidney disease   CURRENT THERAPY: Work-up for therapy consideration.     HISTORY OF PRESENT ILLNESS:  (From Jody Crigler, PA-C's last note: 04/25/16)     INTERVAL HISTORY:  Jody [name] 76 y.o. female here for routine follow-up for anemia due to stage IV chronic kidney disease.   She tells me that she has a horrible headache at present; her blood pressure has been elevated consistently for the past 3 weeks or so. States that headaches come and go, but are worse when her blood pressure is high.  Her cardiologist helps manage her hypertension; she thinks she has an appointment with him in May.   She feels fatigued all the time; energy levels are about 50% of her baseline. Her legs feel very weak; she walks with cane/walker at her home.  Denies any falls.  She lives alone and with the recent change in her insurance, she is hoping to get assistance from home health aide.  Appetite is excellent. She has pain to her legs, which make it difficult to walk at times.  She feels like her legs are really weak; she is afraid she is going to fall in the bathroom when she tries to bathe herself. She is really hoping she gets approved for home health aide; she has never had PT that she is aware of.   She has transportation difficulties. "I have to pay people to bring me to my appointments."      REVIEW OF SYSTEMS:  Review of Systems  Constitutional: Positive for fatigue. Negative for appetite change, chills and fever.  HENT:  Negative.   Eyes: Negative.   Respiratory: Positive for cough and shortness of breath.        Chronic cough and dyspnea on exertion; inhaler helps symptoms   Cardiovascular: Positive for chest pain (reports "chest pain" to left area  under her breast; only occurs with lying down and she attributes this to gas pains; denies any associated diaphoresis, arm/jaw pain, or crushing chest pain. ) and leg swelling. Negative for palpitations.  Gastrointestinal: Negative.  Negative for abdominal pain, blood in stool, constipation, diarrhea, nausea and vomiting.  Endocrine: Negative.   Genitourinary: Negative.  Negative for dysuria, hematuria and vaginal bleeding.   Musculoskeletal: Positive for arthralgias.  Neurological: Positive for dizziness, extremity weakness and headaches ("bad headaches when my blood pressure is high").  Hematological: Negative.   Psychiatric/Behavioral: Positive for sleep disturbance (chronic sleep issues).     PAST MEDICAL/SURGICAL HISTORY:  Past Medical History:  Diagnosis Date  . Anemia in chronic kidney disease (CODE) 06/05/2015  . Arthritis    "bad in my legs" (12/07/2014)  . Atrial fibrillation ([facility]) 06/05/2015  . Chronic back pain    "dr said my spine is crooked"  . Chronic bronchitis ([facility])    "get it q yr" (12/07/2014)  . CKD stage 4 due to type 2 diabetes mellitus ([place]) 06/05/2015  . COPD (chronic obstructive pulmonary disease) ([facility])   . Gait difficulty 09/02/2014  . Heart disease   . Hypercholesterolemia   . Hypertension   . Stroke ([facility]) 05/2014   denies residual on 12/07/2014  . Stroke with cerebral ischemia ([facility])   . Type II diabetes mellitus ([place])    Past Surgical History:  Procedure Laterality Date  . ABDOMINAL HYSTERECTOMY  ~ 1970  . APPENDECTOMY  ~ 1970  . JOINT REPLACEMENT    . TOTAL KNEE ARTHROPLASTY Right ~ 1986  . TUMOR EXCISION  ~ 1970   "9# in my stomach"     SOCIAL HISTORY:  Social History   Social History  . Marital status: Widowed    Spouse name: N/A  . Number of children: 0  . Years of education: 9   Occupational History  . retired    Social History Main Topics  . Smoking status: Former Smoker    Packs/day: 0.50    Years: 50.00    Types: Cigarettes     Start date: 03/07/1953    Quit date: 01/14/2004  . Smokeless tobacco: Never Used     Comment: "quit smoking cigarettes  in ~ 2005"  . Alcohol use No     Comment: 10/242016 "quit drinking beer in ~ 1977"  . Drug use: No  . Sexual activity: No   Other Topics Concern  . Not on file   Social History Narrative   Patient drinks caffeine a few times a week.   Patient is right handed.    FAMILY HISTORY:  Family History  Problem Relation Age of Onset  . Cancer Other   . Heart attack Brother   . Cancer Sister     breast  . Alcohol abuse Brother     CURRENT MEDICATIONS:  Outpatient Encounter Prescriptions as of 05/24/2016  Medication Sig  . albuterol (PROVENTIL HFA;VENTOLIN HFA) 108 (90 BASE) MCG/ACT inhaler Inhale 1-2 puffs into the lungs every 6 (six) hours as needed for wheezing or shortness of breath. Reported on 03/01/2015  . aspirin EC 81 MG tablet Take 81 mg by mouth daily.  [name] atorvastatin (LIPITOR) 40 MG tablet Take 1 tablet by mouth daily.  [name] diltiazem (CARDIZEM CD) 180 MG 24 hr capsule Take 1 capsule (180 mg total) by mouth daily.  [name] ELIQUIS 2.5 MG TABS tablet Take 1 tablet (2.5 mg total) by mouth 2 (two) times daily.  . fluticasone-salmeterol (ADVAIR HFA) 230-21 MCG/ACT inhaler Inhale 2 puffs into the lungs 2 (two) times daily.  . furosemide (LASIX) 40 MG tablet Take 1 tablet (40 mg total) by mouth 2 (two) times daily.  [name] gabapentin (NEURONTIN) 300 MG capsule Take 1 capsule by mouth daily.  . hydrALAZINE (APRESOLINE) 50 MG tablet Take 1 tablet (50 mg total) by mouth 3 (three) times daily.  . Insulin Degludec (TRESIBA FLEXTOUCH) 100 UNIT/ML SOPN Inject 14 Units into the skin daily.   . potassium chloride SA (K-DUR,KLOR-CON) 20 MEQ tablet Take 1 tablet (20 mEq total) by mouth daily.   No facility-administered encounter medications on file as of 05/24/2016.     ALLERGIES:  Allergies  Allergen Reactions  . Codeine Nausea And Vomiting     PHYSICAL EXAM:  ECOG Performance  status: 2-3 - Symptomatic; requires assistance.   Vitals:   05/24/16 1055  BP: (!) 181/68  Pulse: (!) 53  Resp: 18  Temp: 98 F (36.7 C)   Filed Weights   05/24/16 1055  Weight: 211 lb 3.2 oz (95.8 kg)    Physical Exam  Constitutional: She is oriented to person, place, and time and well-developed, well-nourished, and in no distress.  Examined seated in wheelchair   HENT:  Head: Normocephalic.  Mouth/Throat: Oropharynx is clear and moist. No oropharyngeal exudate.  Eyes: Conjunctivae are normal. Pupils are equal, round, and reactive to light. No scleral icterus.  Neck: Normal range of motion. Neck supple.  Cardiovascular: Regular rhythm and normal heart sounds.   Bradycardia   Pulmonary/Chest: Effort normal and breath sounds normal. No respiratory distress. She has no wheezes.  Abdominal: Soft. Bowel sounds are normal. There is no tenderness. There is no rebound and no guarding.  Musculoskeletal: She exhibits edema (1+ BLE/ankle edema ).  Lymphadenopathy:    She has no cervical adenopathy.       Right: No supraclavicular adenopathy present.       Left: No supraclavicular adenopathy present.  Neurological: She is alert and oriented to person, place, and time. No cranial nerve deficit.  Skin: Skin is warm and dry. No rash noted.  Psychiatric: Mood, memory, affect and judgment normal.  Nursing note and vitals reviewed.    LABORATORY DATA:  I have reviewed the labs as listed.  CBC    Component Value Date/Time   WBC 6.0 05/24/2016 1018   RBC 2.97 (L) 05/24/2016 1018   HGB 8.9 (L) 05/24/2016 1018   HCT 27.3 (L) 05/24/2016 1018   PLT 147 (L) 05/24/2016 1018   MCV 91.9 05/24/2016 1018   MCH 30.0 05/24/2016 1018   MCHC 32.6 05/24/2016 1018   RDW 16.9 (H) 05/24/2016 1018   LYMPHSABS 1.1 05/24/2016 1018   MONOABS 0.4 05/24/2016 1018   EOSABS 0.4 05/24/2016 1018   BASOSABS 0.0 05/24/2016 1018   CMP Latest Ref Rng & Units 05/24/2016 04/25/2016 10/05/2015  Glucose 65 - 99  mg/dL 100(H) 101(H) 137(H)  BUN 6 - 20 mg/dL 52(H) 61(H) 71(H)  Creatinine 0.44 - 1.00 mg/dL 2.50(H) 2.64(H) 2.71(H)  Sodium 135 - 145 mmol/L 139 137 139  Potassium 3.5 - 5.1 mmol/L 5.1 4.7 4.4  Chloride 101 - 111 mmol/L 110 108 106  CO2 22 - 32 mmol/L 20(L) 20(L) 22  Calcium 8.9 - 10.3 mg/dL 9.2 8.7(L) 9.1  Total Protein 6.5 - 8.1 g/dL - 7.6 -  Total Bilirubin 0.3 - 1.2 mg/dL - 0.7 -  Alkaline Phos 38 - 126 U/L - 89 -  AST 15 - 41 U/L - 25 -  ALT 14 - 54 U/L - 24 -   Results for AKEYA, RYTHER (MRN [id number])   Ref. Range 04/25/2016 12:00  Iron Latest Ref Range: 28 - 170 ug/dL 56  UIBC Latest Units: ug/dL 281  TIBC Latest Ref Range: 250 - 450 ug/dL 337  Saturation Ratios Latest Ref Range: 10.4 - 31.8 % 17  Ferritin Latest Ref Range: 11 - 307 ng/mL 84   Results for YASMEN, CORTNER (MRN [id number])   Ref. Range 04/25/2016 12:01  Folate Latest Ref Range: >5.9 ng/mL 16.2   Results for ELEXIA, FRIEDT (MRN [id number])   Ref. Range 04/25/2016 12:00  CRP Latest Ref Range: <1.0 mg/dL <0.8  Vitamin B12 Latest Ref Range: 180 - 914 pg/mL 441   Results for AIDELIZ, GARMANY (MRN [id number])  Ref. Range 04/25/2016 12:00  Basophils Absolute Latest Ref Range: 0.0 - 0.1 K/uL 0.0  RBC Morphology Unknown SLIGHT ANISOCYTOSIS  RBC. Latest Ref Range: 3.87 - 5.11 MIL/uL 2.79 (L)  Retic Ct Pct Latest Ref Range: 0.4 - 3.1 % 3.9 (H)  Retic Count, Manual Latest Ref Range: 19.0 - 186.0 K/uL 108.8   Results for NANI, INGRAM (MRN [id number])  Ref. Range 04/25/2016 12:00  Haptoglobin Latest Ref Range: 34 - 200 mg/dL 147  Erythropoietin Latest Ref Range: 2.6 - 18.5 mIU/mL 32.5 (H)  Sed Rate Latest Ref Range: 0 - 22 mm/hr 124 (H)   Results  for ROSANN, GORUM (MRN [id number])  Ref. Range 04/25/2016 12:00  Hgb A Latest Ref Range: 96.4 - 98.8 % 97.8  Hgb A2 Quant Latest Ref Range: 1.8 - 3.2 % 2.2  Hgb F Quant Latest Ref Range: 0.0 - 2.0 % 0.0  Hgb S Quant Latest Ref Range: 0.0 % 0.0  HGB C Latest Ref  Range: 0.0 % 0.0  HGB VARIANT Latest Ref Range: 0.0 % 0.0   Results for DUAA, STELZNER (MRN [id number])  Ref. Range 04/25/2016 12:01  Total Protein ELP Latest Ref Range: 6.0 - 8.5 g/dL 7.2  Albumin ELP Latest Ref Range: 2.9 - 4.4 g/dL 3.3  Globulin, Total Latest Ref Range: 2.2 - 3.9 g/dL 3.9  A/G Ratio Latest Ref Range: 0.7 - 1.7  0.8  Alpha-1-Globulin Latest Ref Range: 0.0 - 0.4 g/dL 0.2  Alpha-2-Globulin Latest Ref Range: 0.4 - 1.0 g/dL 0.8  Beta Globulin Latest Ref Range: 0.7 - 1.3 g/dL 1.0  Gamma Globulin Latest Ref Range: 0.4 - 1.8 g/dL 1.9 (H)  M-SPIKE, % Latest Ref Range: Not Observed g/dL Not Observed        PENDING LABS:    DIAGNOSTIC IMAGING:    PATHOLOGY:     ASSESSMENT & PLAN:   Iron deficiency anemia:  -Ferritin low at 84; goal is ferritin >100 before initiating Aranesp.  -Will make arrangements for 1 dose of IV ferric gluconate 125 mg early next week, given her chronic kidney disease.   Anemia due to chronic kidney disease:  -Erythropoietin elevated at 32.5.  -Will optimize iron deficiency with IV iron, then we will initiate Aranesp injections every 2 weeks for hemoglobin 11 g/dL or less.   Polyclonal gammopathy:  -SPEP revealed polyclonal gammopathy; no M-spike. -There is evidence of elevate kappa/lambda light chains, with elevated ratio.  Could be elevated in setting of chronic kidney disease. Will consider repeating SPEP/IFE with IgG, IgA, & IgM in 3 months.   Hypertension:  -SBP elevated 180s today. Also with headache. Reports she took her 1st dose of oral hydralazine this morning (prescription is for 50 mg TID). -10 mg IV Hydralazine given in clinic today. She was monitored for about 30 minutes after injection.  SBP decreased to 130s and headache resolved. She was discharged home in stable condition.  -Recommended she follow-up with her cardiologist as soon as she's able to optimize her anti-hypertensive medications.  I will share today's office visit  note with Dr. Bronson Ing to make him aware.   Physical deconditioning/At risk for falls:  -Recommended she talk with her PCP about considering referral for home PT/OT to improve physical deconditioning and decrease risk for falls.  -Encouraged continued use of assistive devices, when able. Reviewed fall precautions with her and expressed concern for her safety since she lives alone.     Dispo:  -Return to cancer center early next week for 1 dose of IV iron.  -Labs only late next week (CBC with diff, iron studies).  -Return to cancer center for follow-up visit and 1st Aranesp injection in about 2 weeks.  -Standing orders for CBC with diff and ferritin every 2 weeks placed today.    All questions were answered to patient's stated satisfaction. Encouraged patient to call with any new concerns or questions before her next visit to the cancer center and we can certain see her sooner, if needed.    Plan of care discussed with Dr. Talbert Cage, who agrees with the above aforementioned.     Orders placed this encounter:  Orders Placed  This Encounter  Procedures  . CBC with Differential/Platelet  . Ferritin  . Iron and TIBC  . Ferritin  . CBC with Differential/Platelet      Mike Craze, NP [place] [phone]

## 2016-05-24 NOTE — Patient Instructions (Addendum)
[place] at [facility] Discharge Instructions  RECOMMENDATIONS MADE BY THE CONSULTANT AND ANY TEST RESULTS WILL BE SENT TO YOUR REFERRING PHYSICIAN.  You saw Jody Craze, NP, today IV Iron next week. You had IV hydralazine due to elevated BP before leaving cancer center. Follow up with cardiologist asap. See Jody [name] at checkout for appointments.  Thank you for choosing [place] at [facility] to provide your oncology and hematology care.  To afford each patient quality time with our provider, please arrive at least 15 minutes before your scheduled appointment time.    If you have a lab appointment with the [facility] please come in thru the  Main Entrance and check in at the main information desk  You need to re-schedule your appointment should you arrive 10 or more minutes late.  We strive to give you quality time with our providers, and arriving late affects you and other patients whose appointments are after yours.  Also, if you no show three or more times for appointments you may be dismissed from the clinic at the providers discretion.     Again, thank you for choosing [facility].  Our hope is that these requests will decrease the amount of time that you wait before being seen by our physicians.       _____________________________________________________________  Should you have questions after your visit to [facility], please contact our office at [phone] between the hours of 8:30 a.m. and 4:30 p.m.  Voicemails left after 4:30 p.m. will not be returned until the following business day.  For prescription refill requests, have your pharmacy contact our office.       Resources For Cancer Patients and their Caregivers ? American Cancer Society: Can assist with transportation, wigs, general needs, runs Look Good Feel Better.        859-547-2974 ? Cancer Care: Provides financial assistance,  online support groups, medication/co-pay assistance.  1-800-813-HOPE (843) 125-1866) ? [place] Assists [place] cancer patients and their families through emotional , educational and financial support.  561-311-6265 ? [place] DSS Where to apply for food stamps, Medicaid and utility assistance. 2182952915 ? RCATS: Transportation to medical appointments. (207)088-9330 ? Social Security Administration: May apply for disability if have a Stage IV cancer. 212-481-4926 720-416-9057 ? [employer], Disability and Transit Services: Assists with nutrition, care and transit needs. [place] Support Programs: @10RELATIVEDAYS @ > Cancer Support Group  2nd Tuesday of the month 1pm-2pm, Journey Room  > Creative Journey  3rd Tuesday of the month 1130am-1pm, Journey Room  > Look Good Feel Better  1st Wednesday of the month 10am-12 noon, Journey Room (Call [place] to register [phone])

## 2016-05-24 NOTE — Progress Notes (Signed)
Patient not to get Aranesp this visit per NP and PA-C r/t her anemia being more adequately treated with IV iron prior to another dose of Aranesp.  [name] aware of patient's insurance status and plans for supportive therapy.

## 2016-05-24 NOTE — Progress Notes (Signed)
Patient blood pressure greatly decreased to normal parameters.  Headache is gone.  NP aware and allows for patient to be discharged from the clinic.  Patient wheeled out by aide to her caregiver in stable condition.

## 2016-05-25 ENCOUNTER — Telehealth: Payer: Self-pay

## 2016-05-25 MED ORDER — HYDRALAZINE HCL 50 MG PO TABS: 75.0000 mg | ORAL_TABLET | Freq: Three times a day (TID) | ORAL | 3 refills | Status: DC

## 2016-05-25 NOTE — Telephone Encounter (Signed)
-----   Message from Herminio Commons, MD sent at 05/25/2016 11:26 AM EDT ----- I recently increased hydralazine to 50 mg tid as her SBP was 197 in my office. Please have her increase to 75 mg tid. Thank you.  Jamesetta So  ----- Message ----- From: Holley Bouche, NP Sent: 05/24/2016   5:52 PM To: Herminio Commons, MD  Hi Dr. Bronson Ing,   I saw a mutual patient today at the cancer center. We are seeing her symptomatic anemia.  I just wanted to make you aware that she reported elevated BP x 3 weeks. States she has been compliant with her current antihypertensive regimen.  She had significant headache and SBP 180s today in our clinic.  We gave her 1 dose of 10 mg IV hydralazine, which brought her SBP down to 130s.  We did not make any adjustments to her home medications, but wanted to make you aware.  Strongly recommended she follow-up with you as directed. Please let us know if there is anything we can do to help or if you have any questions/concerns.   Thanks so much! Mike Craze, NP [place] [phone]

## 2016-05-25 NOTE — Telephone Encounter (Signed)
I left message on niece's phone telling her that Ms Ibe's  hyralazine was increased to 75 mg TID and that I e-scribed 90 day supply to [facility] in [place]. I asked her to call back to confirm she got message

## 2016-05-29 ENCOUNTER — Encounter (HOSPITAL_BASED_OUTPATIENT_CLINIC_OR_DEPARTMENT_OTHER): Payer: Medicare Other

## 2016-05-29 ENCOUNTER — Encounter (HOSPITAL_COMMUNITY): Payer: Self-pay

## 2016-05-29 VITALS — BP 210/86 | HR 62 | Temp 98.1°F | Resp 18

## 2016-05-29 DIAGNOSIS — D509 Iron deficiency anemia, unspecified: Secondary | ICD-10-CM | POA: Diagnosis not present

## 2016-05-29 DIAGNOSIS — D631 Anemia in chronic kidney disease: Secondary | ICD-10-CM

## 2016-05-29 MED ORDER — SODIUM CHLORIDE 0.9 % IV SOLN
125.0000 mg | Freq: Once | INTRAVENOUS | Status: DC
  Filled 2016-05-29: qty 10

## 2016-05-29 MED ORDER — SODIUM CHLORIDE 0.9 % IV SOLN
125.0000 mg | Freq: Once | INTRAVENOUS | Status: AC
  Administered 2016-05-29: 125 mg via INTRAVENOUS
  Filled 2016-05-29: qty 5

## 2016-05-29 MED ORDER — SODIUM CHLORIDE 0.9 % IV SOLN
Freq: Once | INTRAVENOUS | Status: AC
  Administered 2016-05-29: 15:00:00 via INTRAVENOUS

## 2016-05-29 NOTE — Progress Notes (Signed)
Pt reports she did not take her blood pressure medication today as she did not want to have to get up and go to the bathroom frequently.   Instructed pt to take her blood pressure medication ASAP. She reports she has two medications she takes for BP and will take them both as soon as she gets home.  Instructed pt on the importance of taking her medication as prescribed to prevent kidney damage, stroke.  She verbalizes understanding.  Discharged via wheelchair in c/o family for transport home.

## 2016-05-29 NOTE — Patient Instructions (Signed)
[place] at [facility] Discharge Instructions  RECOMMENDATIONS MADE BY THE CONSULTANT AND ANY TEST RESULTS WILL BE SENT TO YOUR REFERRING PHYSICIAN.  Iron infusion today. Take your blood pressure medication as prescribed. Return as scheduled.   Thank you for choosing [place] at [facility] to provide your oncology and hematology care.  To afford each patient quality time with our provider, please arrive at least 15 minutes before your scheduled appointment time.    If you have a lab appointment with the [facility] please come in thru the  Main Entrance and check in at the main information desk  You need to re-schedule your appointment should you arrive 10 or more minutes late.  We strive to give you quality time with our providers, and arriving late affects you and other patients whose appointments are after yours.  Also, if you no show three or more times for appointments you may be dismissed from the clinic at the providers discretion.     Again, thank you for choosing [facility].  Our hope is that these requests will decrease the amount of time that you wait before being seen by our physicians.       _____________________________________________________________  Should you have questions after your visit to [facility], please contact our office at [phone] between the hours of 8:30 a.m. and 4:30 p.m.  Voicemails left after 4:30 p.m. will not be returned until the following business day.  For prescription refill requests, have your pharmacy contact our office.       Resources For Cancer Patients and their Caregivers ? American Cancer Society: Can assist with transportation, wigs, general needs, runs Look Good Feel Better.        812-164-7161 ? Cancer Care: Provides financial assistance, online support groups, medication/co-pay assistance.  1-800-813-HOPE 416-499-6675) ? [place] Assists [place] cancer patients and their families through emotional , educational and financial support.  484-640-4425 ? [place] DSS Where to apply for food stamps, Medicaid and utility assistance. 830-364-4687 ? RCATS: Transportation to medical appointments. 7477769438 ? Social Security Administration: May apply for disability if have a Stage IV cancer. ([phone] 541-638-4619 ? [employer], Disability and Transit Services: Assists with nutrition, care and transit needs. [place] Support Programs: @10RELATIVEDAYS @ > Cancer Support Group  2nd Tuesday of the month 1pm-2pm, Journey Room  > Creative Journey  3rd Tuesday of the month 1130am-1pm, Journey Room  > Look Good Feel Better  1st Wednesday of the month 10am-12 noon, Journey Room (Call [place] to register [phone])

## 2016-06-02 ENCOUNTER — Encounter (HOSPITAL_COMMUNITY): Payer: Medicare Other

## 2016-06-02 DIAGNOSIS — D631 Anemia in chronic kidney disease: Secondary | ICD-10-CM

## 2016-06-02 DIAGNOSIS — D509 Iron deficiency anemia, unspecified: Secondary | ICD-10-CM

## 2016-06-02 DIAGNOSIS — N189 Chronic kidney disease, unspecified: Secondary | ICD-10-CM | POA: Diagnosis not present

## 2016-06-02 LAB — CBC WITH DIFFERENTIAL/PLATELET
BASOS ABS: 0 10*3/uL (ref 0.0–0.1)
BASOS PCT: 0 %
Eosinophils Absolute: 0.3 10*3/uL (ref 0.0–0.7)
Eosinophils Relative: 5 %
HCT: 26.4 % — ABNORMAL LOW (ref 36.0–46.0)
HEMOGLOBIN: 8.6 g/dL — AB (ref 12.0–15.0)
LYMPHS PCT: 20 %
Lymphs Abs: 1.2 10*3/uL (ref 0.7–4.0)
MCH: 30.2 pg (ref 26.0–34.0)
MCHC: 32.6 g/dL (ref 30.0–36.0)
MCV: 92.6 fL (ref 78.0–100.0)
MONO ABS: 0.4 10*3/uL (ref 0.1–1.0)
Monocytes Relative: 6 %
NEUTROS ABS: 4.2 10*3/uL (ref 1.7–7.7)
NEUTROS PCT: 69 %
Platelets: 154 10*3/uL (ref 150–400)
RBC: 2.85 MIL/uL — AB (ref 3.87–5.11)
RDW: 16.9 % — AB (ref 11.5–15.5)
WBC: 6.1 10*3/uL (ref 4.0–10.5)

## 2016-06-02 LAB — IRON AND TIBC
IRON: 64 ug/dL (ref 28–170)
SATURATION RATIOS: 20 % (ref 10.4–31.8)
TIBC: 316 ug/dL (ref 250–450)
UIBC: 252 ug/dL

## 2016-06-02 LAB — FERRITIN: Ferritin: 136 ng/mL (ref 11–307)

## 2016-06-07 ENCOUNTER — Encounter (HOSPITAL_BASED_OUTPATIENT_CLINIC_OR_DEPARTMENT_OTHER): Payer: Medicare Other

## 2016-06-07 ENCOUNTER — Other Ambulatory Visit (HOSPITAL_COMMUNITY): Payer: Medicare HMO

## 2016-06-07 ENCOUNTER — Encounter (HOSPITAL_COMMUNITY): Payer: Self-pay

## 2016-06-07 VITALS — BP 198/78 | HR 52 | Temp 98.3°F | Resp 18

## 2016-06-07 DIAGNOSIS — N184 Chronic kidney disease, stage 4 (severe): Secondary | ICD-10-CM | POA: Diagnosis not present

## 2016-06-07 DIAGNOSIS — D631 Anemia in chronic kidney disease: Secondary | ICD-10-CM

## 2016-06-07 DIAGNOSIS — I10 Essential (primary) hypertension: Secondary | ICD-10-CM

## 2016-06-07 MED ORDER — DARBEPOETIN ALFA 60 MCG/0.3ML IJ SOSY
PREFILLED_SYRINGE | INTRAMUSCULAR | Status: AC
  Filled 2016-06-07: qty 0.3

## 2016-06-07 MED ORDER — DARBEPOETIN ALFA 60 MCG/0.3ML IJ SOSY
60.0000 ug | PREFILLED_SYRINGE | Freq: Once | INTRAMUSCULAR | Status: AC
  Administered 2016-06-07: 60 ug via SUBCUTANEOUS

## 2016-06-07 MED ORDER — PEGFILGRASTIM INJECTION 6 MG/0.6ML ~~LOC~~
PREFILLED_SYRINGE | SUBCUTANEOUS | Status: AC
  Filled 2016-06-07: qty 0.6

## 2016-06-07 MED ORDER — CLONIDINE HCL 0.1 MG PO TABS
0.1000 mg | ORAL_TABLET | Freq: Once | ORAL | Status: AC
  Administered 2016-06-07: 0.1 mg via ORAL
  Filled 2016-06-07: qty 1

## 2016-06-07 NOTE — Patient Instructions (Signed)
[place] at [facility] Discharge Instructions  RECOMMENDATIONS MADE BY THE CONSULTANT AND ANY TEST RESULTS WILL BE SENT TO YOUR REFERRING PHYSICIAN.  TAKE YOUR BLOOD PRESSURE MEDICATIONS DAILY AS PRESCRIBED! Aranesp injection today. Return as scheduled for lab work and injections.   Thank you for choosing [place] at [facility] to provide your oncology and hematology care.  To afford each patient quality time with our provider, please arrive at least 15 minutes before your scheduled appointment time.    If you have a lab appointment with the [facility] please come in thru the  Main Entrance and check in at the main information desk  You need to re-schedule your appointment should you arrive 10 or more minutes late.  We strive to give you quality time with our providers, and arriving late affects you and other patients whose appointments are after yours.  Also, if you no show three or more times for appointments you may be dismissed from the clinic at the providers discretion.     Again, thank you for choosing [facility].  Our hope is that these requests will decrease the amount of time that you wait before being seen by our physicians.       _____________________________________________________________  Should you have questions after your visit to [facility], please contact our office at [phone] between the hours of 8:30 a.m. and 4:30 p.m.  Voicemails left after 4:30 p.m. will not be returned until the following business day.  For prescription refill requests, have your pharmacy contact our office.       Resources For Cancer Patients and their Caregivers ? American Cancer Society: Can assist with transportation, wigs, general needs, runs Look Good Feel Better.        ([phone] ? Cancer Care: Provides financial assistance, online support groups, medication/co-pay assistance.  1-800-813-HOPE  2600679939) ? [place] Assists [place] cancer patients and their families through emotional , educational and financial support.  (502) 654-1030 ? [place] DSS Where to apply for food stamps, Medicaid and utility assistance. 612-828-9184 ? RCATS: Transportation to medical appointments. (618)591-6461 ? Social Security Administration: May apply for disability if have a Stage IV cancer. 8047303922 (585)079-4038 ? [employer], Disability and Transit Services: Assists with nutrition, care and transit needs. [place] Support Programs: @10RELATIVEDAYS @ > Cancer Support Group  2nd Tuesday of the month 1pm-2pm, Journey Room  > Creative Journey  3rd Tuesday of the month 1130am-1pm, Journey Room  > Look Good Feel Better  1st Wednesday of the month 10am-12 noon, Journey Room (Call [place] to register [phone])

## 2016-06-07 NOTE — Progress Notes (Signed)
1013:  Pt reports she did not take any of her BP medications this morning due to the fact that , "I don't want have to keep going to the bathroom".  Pt instructed that 3 of her 4 blood pressure medications do not cause increased urination; pt also instructed on the importance of daily compliance with her BP meds.  Verbalizes understanding and states she will take all of her meds when she gets home.  Dr. Talbert Cage notified of pt's BP 198/79.  Okay to give Aranesp today per MD.  Order rec'd for Clonidine 0.1 mg po x 1 dose then okay to d/c pt home.   Janifer Gieselman presents today for injection per the provider's orders.  Aranesp administration without incident; see MAR for injection details.  Medicated for elevated BP as ordered. Patient tolerated procedure well and without incident.  No questions or complaints noted at this time. Discharged via wheelchair in c/o family for transport home.

## 2016-06-21 ENCOUNTER — Encounter (HOSPITAL_COMMUNITY): Payer: Self-pay

## 2016-06-21 ENCOUNTER — Encounter (HOSPITAL_COMMUNITY): Payer: Medicare Other

## 2016-06-21 ENCOUNTER — Encounter (HOSPITAL_COMMUNITY): Payer: Medicare Other | Attending: Oncology

## 2016-06-21 VITALS — BP 191/62 | HR 41 | Temp 98.1°F | Resp 18

## 2016-06-21 DIAGNOSIS — D509 Iron deficiency anemia, unspecified: Secondary | ICD-10-CM

## 2016-06-21 DIAGNOSIS — D631 Anemia in chronic kidney disease: Secondary | ICD-10-CM

## 2016-06-21 DIAGNOSIS — N184 Chronic kidney disease, stage 4 (severe): Secondary | ICD-10-CM | POA: Insufficient documentation

## 2016-06-21 LAB — CBC WITH DIFFERENTIAL/PLATELET
Basophils Absolute: 0 10*3/uL (ref 0.0–0.1)
Basophils Relative: 0 %
Eosinophils Absolute: 0.3 10*3/uL (ref 0.0–0.7)
Eosinophils Relative: 5 %
HEMATOCRIT: 27.6 % — AB (ref 36.0–46.0)
HEMOGLOBIN: 8.8 g/dL — AB (ref 12.0–15.0)
LYMPHS PCT: 19 %
Lymphs Abs: 1.1 10*3/uL (ref 0.7–4.0)
MCH: 29.9 pg (ref 26.0–34.0)
MCHC: 31.9 g/dL (ref 30.0–36.0)
MCV: 93.9 fL (ref 78.0–100.0)
Monocytes Absolute: 0.5 10*3/uL (ref 0.1–1.0)
Monocytes Relative: 8 %
Neutro Abs: 3.8 10*3/uL (ref 1.7–7.7)
Neutrophils Relative %: 68 %
Platelets: 166 10*3/uL (ref 150–400)
RBC: 2.94 MIL/uL — AB (ref 3.87–5.11)
RDW: 17.6 % — ABNORMAL HIGH (ref 11.5–15.5)
WBC: 5.7 10*3/uL (ref 4.0–10.5)

## 2016-06-21 LAB — FERRITIN: FERRITIN: 97 ng/mL (ref 11–307)

## 2016-06-21 MED ORDER — DARBEPOETIN ALFA 60 MCG/0.3ML IJ SOSY
60.0000 ug | PREFILLED_SYRINGE | Freq: Once | INTRAMUSCULAR | Status: AC
  Administered 2016-06-21: 60 ug via SUBCUTANEOUS

## 2016-06-21 MED ORDER — DARBEPOETIN ALFA 60 MCG/0.3ML IJ SOSY
PREFILLED_SYRINGE | INTRAMUSCULAR | Status: AC
  Filled 2016-06-21: qty 0.3

## 2016-06-21 NOTE — Progress Notes (Signed)
Pt reports that she did not take any of her BP medication this morning d/t one of the medications "makes me have to go to the bathroom".  Pt has previously been educated and instructed re: blood pressure medication and importance of compliance.  Jody Hamman, PA notified of pt's systolic BP.  Okay to give Aranesp today per PA.  Pt instructed to take her medications for her blood pressure as soon as she gets home.  Pt states she will do so.   Jody [name] presents today for injection per the provider's orders.  Aranesp administration without incident; see MAR for injection details.  Patient tolerated procedure well and without incident.  No questions or complaints noted at this time.  Discharged via wheelchair in c/o family.

## 2016-06-21 NOTE — Patient Instructions (Signed)
[facility] at [facility] Discharge Instructions  RECOMMENDATIONS MADE BY THE CONSULTANT AND ANY TEST RESULTS WILL BE SENT TO YOUR REFERRING PHYSICIAN.  Aranesp injection today. Return as scheduled.   Thank you for choosing [facility] at [facility] to provide your oncology and hematology care.  To afford each patient quality time with our provider, please arrive at least 15 minutes before your scheduled appointment time.    If you have a lab appointment with the Cancer Center please come in thru the  Main Entrance and check in at the main information desk  You need to re-schedule your appointment should you arrive 10 or more minutes late.  We strive to give you quality time with our providers, and arriving late affects you and other patients whose appointments are after yours.  Also, if you no show three or more times for appointments you may be dismissed from the clinic at the providers discretion.     Again, thank you for choosing [facility].  Our hope is that these requests will decrease the amount of time that you wait before being seen by our physicians.       _____________________________________________________________  Should you have questions after your visit to [facility], please contact our office at [phone] between the hours of 8:30 a.m. and 4:30 p.m.  Voicemails left after 4:30 p.m. will not be returned until the following business day.  For prescription refill requests, have your pharmacy contact our office.       Resources For Cancer Patients and their Caregivers ? American Cancer Society: Can assist with transportation, wigs, general needs, runs Look Good Feel Better.        1-888-227-6333 ? Cancer Care: Provides financial assistance, online support groups, medication/co-pay assistance.  1-800-813-HOPE (4673) ? [facility] Assists [place] cancer patients and  their families through emotional , educational and financial support.  336-427-4357 ? [place] DSS Where to apply for food stamps, Medicaid and utility assistance. 336-342-1394 ? RCATS: Transportation to medical appointments. 336-347-2287 ? Social Security Administration: May apply for disability if have a Stage IV cancer. 336-342-7796 1-800-772-1213 ? [place] Aging, Disability and Transit Services: Assists with nutrition, care and transit needs. 336-349-2343  Cancer Center Support Programs: @10RELATIVEDAYS@ > Cancer Support Group  2nd Tuesday of the month 1pm-2pm, Journey Room  > Creative Journey  3rd Tuesday of the month 1130am-1pm, Journey Room  > Look Good Feel Better  1st Wednesday of the month 10am-12 noon, Journey Room (Call American Cancer Society to register 1-800-395-5775)   

## 2016-07-05 ENCOUNTER — Encounter (HOSPITAL_COMMUNITY): Payer: Self-pay

## 2016-07-05 ENCOUNTER — Encounter (HOSPITAL_COMMUNITY): Payer: Medicare Other | Attending: Oncology

## 2016-07-05 ENCOUNTER — Encounter (HOSPITAL_COMMUNITY): Payer: Medicare Other

## 2016-07-05 VITALS — BP 158/89 | HR 60 | Temp 97.6°F | Resp 18

## 2016-07-05 DIAGNOSIS — D631 Anemia in chronic kidney disease: Secondary | ICD-10-CM | POA: Diagnosis not present

## 2016-07-05 DIAGNOSIS — D509 Iron deficiency anemia, unspecified: Secondary | ICD-10-CM | POA: Diagnosis not present

## 2016-07-05 DIAGNOSIS — N184 Chronic kidney disease, stage 4 (severe): Secondary | ICD-10-CM | POA: Diagnosis not present

## 2016-07-05 LAB — CBC WITH DIFFERENTIAL/PLATELET
BASOS ABS: 0 10*3/uL (ref 0.0–0.1)
BASOS PCT: 1 %
EOS ABS: 0.4 10*3/uL (ref 0.0–0.7)
EOS PCT: 7 %
HCT: 28.5 % — ABNORMAL LOW (ref 36.0–46.0)
Hemoglobin: 9.2 g/dL — ABNORMAL LOW (ref 12.0–15.0)
LYMPHS PCT: 21 %
Lymphs Abs: 1 10*3/uL (ref 0.7–4.0)
MCH: 30 pg (ref 26.0–34.0)
MCHC: 32.3 g/dL (ref 30.0–36.0)
MCV: 92.8 fL (ref 78.0–100.0)
Monocytes Absolute: 0.4 10*3/uL (ref 0.1–1.0)
Monocytes Relative: 7 %
Neutro Abs: 3.3 10*3/uL (ref 1.7–7.7)
Neutrophils Relative %: 64 %
Platelets: 159 10*3/uL (ref 150–400)
RBC: 3.07 MIL/uL — AB (ref 3.87–5.11)
RDW: 16.9 % — ABNORMAL HIGH (ref 11.5–15.5)
WBC: 5 10*3/uL (ref 4.0–10.5)

## 2016-07-05 LAB — FERRITIN: FERRITIN: 82 ng/mL (ref 11–307)

## 2016-07-05 MED ORDER — DARBEPOETIN ALFA 60 MCG/0.3ML IJ SOSY
PREFILLED_SYRINGE | INTRAMUSCULAR | Status: AC
  Filled 2016-07-05: qty 0.3

## 2016-07-05 MED ORDER — DARBEPOETIN ALFA 60 MCG/0.3ML IJ SOSY
60.0000 ug | PREFILLED_SYRINGE | Freq: Once | INTRAMUSCULAR | Status: AC
  Administered 2016-07-05: 60 ug via SUBCUTANEOUS

## 2016-07-05 NOTE — Patient Instructions (Signed)
[facility] at [facility] Discharge Instructions  RECOMMENDATIONS MADE BY THE CONSULTANT AND ANY TEST RESULTS WILL BE SENT TO YOUR REFERRING PHYSICIAN.  Aranesp given today  Follow up as scheduled.  Thank you for choosing [facility] at [facility] to provide your oncology and hematology care.  To afford each patient quality time with our provider, please arrive at least 15 minutes before your scheduled appointment time.    If you have a lab appointment with the Cancer Center please come in thru the  Main Entrance and check in at the main information desk  You need to re-schedule your appointment should you arrive 10 or more minutes late.  We strive to give you quality time with our providers, and arriving late affects you and other patients whose appointments are after yours.  Also, if you no show three or more times for appointments you may be dismissed from the clinic at the providers discretion.     Again, thank you for choosing [facility].  Our hope is that these requests will decrease the amount of time that you wait before being seen by our physicians.       _____________________________________________________________  Should you have questions after your visit to  Cancer Center, please contact our office at [phone] between the hours of 8:30 a.m. and 4:30 p.m.  Voicemails left after 4:30 p.m. will not be returned until the following business day.  For prescription refill requests, have your pharmacy contact our office.       Resources For Cancer Patients and their Caregivers ? American Cancer Society: Can assist with transportation, wigs, general needs, runs Look Good Feel Better.        1-888-227-6333 ? Cancer Care: Provides financial assistance, online support groups, medication/co-pay assistance.  1-800-813-HOPE (4673) ? [facility] Assists [place] cancer patients and  their families through emotional , educational and financial support.  336-427-4357 ? [place] DSS Where to apply for food stamps, Medicaid and utility assistance. 336-342-1394 ? RCATS: Transportation to medical appointments. 336-347-2287 ? Social Security Administration: May apply for disability if have a Stage IV cancer. 336-342-7796 1-800-772-1213 ? [place] Aging, Disability and Transit Services: Assists with nutrition, care and transit needs. 336-349-2343  Cancer Center Support Programs: @10RELATIVEDAYS@ > Cancer Support Group  2nd Tuesday of the month 1pm-2pm, Journey Room  > Creative Journey  3rd Tuesday of the month 1130am-1pm, Journey Room  > Look Good Feel Better  1st Wednesday of the month 10am-12 noon, Journey Room (Call American Cancer Society to register 1-800-395-5775)   

## 2016-07-05 NOTE — Progress Notes (Signed)
Blood pressure elevated, patient states she had not taken her blood pressure medicine today. Rechecked BP and is was a little better. Notified Tom [id number] PA-C  Jody [name] presents today for injection per MD orders. Aranesp 3mcg administered SQ in left Abdomen. Administration without incident. Patient tolerated well.  Vitals stable and discharged home via wheelchair. Follow up as scheduled.

## 2016-07-12 ENCOUNTER — Encounter: Payer: Self-pay | Admitting: Cardiovascular Disease

## 2016-07-12 ENCOUNTER — Ambulatory Visit (INDEPENDENT_AMBULATORY_CARE_PROVIDER_SITE_OTHER): Payer: Medicare Other | Admitting: Cardiovascular Disease

## 2016-07-12 VITALS — BP 168/77 | HR 61 | Ht 66.0 in | Wt 211.0 lb

## 2016-07-12 DIAGNOSIS — R931 Abnormal findings on diagnostic imaging of heart and coronary circulation: Secondary | ICD-10-CM | POA: Diagnosis not present

## 2016-07-12 DIAGNOSIS — I4729 Other ventricular tachycardia: Secondary | ICD-10-CM

## 2016-07-12 DIAGNOSIS — N189 Chronic kidney disease, unspecified: Secondary | ICD-10-CM | POA: Diagnosis not present

## 2016-07-12 DIAGNOSIS — I480 Paroxysmal atrial fibrillation: Secondary | ICD-10-CM

## 2016-07-12 DIAGNOSIS — D631 Anemia in chronic kidney disease: Secondary | ICD-10-CM

## 2016-07-12 DIAGNOSIS — I5032 Chronic diastolic (congestive) heart failure: Secondary | ICD-10-CM | POA: Diagnosis not present

## 2016-07-12 DIAGNOSIS — I10 Essential (primary) hypertension: Secondary | ICD-10-CM

## 2016-07-12 DIAGNOSIS — I472 Ventricular tachycardia: Secondary | ICD-10-CM | POA: Diagnosis not present

## 2016-07-12 MED ORDER — HYDRALAZINE HCL 100 MG PO TABS: 100.0000 mg | ORAL_TABLET | Freq: Three times a day (TID) | ORAL | 3 refills | Status: DC

## 2016-07-12 NOTE — Progress Notes (Signed)
SUBJECTIVE: The patient presents for follow-up of hypertension. Blood pressure is 168/77. I increased hydralazine to 75 mg three times daily in April. She also has a history of atrial fibrillation, chronic diastolic heart failure, and nonsustained ventricular tachycardia.  She has some right flank and back pain. She denies chest pain and headaches. She takes her medications faithfully.   Review of Systems: As per "subjective", otherwise negative.  Allergies  Allergen Reactions  . Codeine Nausea And Vomiting    Current Outpatient Prescriptions  Medication Sig Dispense Refill  . albuterol (PROVENTIL HFA;VENTOLIN HFA) 108 (90 BASE) MCG/ACT inhaler Inhale 1-2 puffs into the lungs every 6 (six) hours as needed for wheezing or shortness of breath. Reported on 03/01/2015    . aspirin EC 81 MG tablet Take 81 mg by mouth daily.    [name] atorvastatin (LIPITOR) 40 MG tablet Take 1 tablet by mouth daily.  0  . diltiazem (CARDIZEM CD) 180 MG 24 hr capsule Take 1 capsule (180 mg total) by mouth daily. 30 capsule 3  . ELIQUIS 2.5 MG TABS tablet Take 1 tablet (2.5 mg total) by mouth 2 (two) times daily. 60 tablet 3  . fluticasone-salmeterol (ADVAIR HFA) 230-21 MCG/ACT inhaler Inhale 2 puffs into the lungs 2 (two) times daily.    . furosemide (LASIX) 40 MG tablet Take 1 tablet (40 mg total) by mouth 2 (two) times daily. 60 tablet 3  . gabapentin (NEURONTIN) 300 MG capsule Take 1 capsule by mouth daily.  0  . hydrALAZINE (APRESOLINE) 50 MG tablet Take 1.5 tablets (75 mg total) by mouth 3 (three) times daily. 405 tablet 3  . Insulin Degludec (TRESIBA FLEXTOUCH) 100 UNIT/ML SOPN Inject 14 Units into the skin daily.     . potassium chloride SA (K-DUR,KLOR-CON) 20 MEQ tablet Take 1 tablet (20 mEq total) by mouth daily. 60 tablet 3   No current facility-administered medications for this visit.     Past Medical History:  Diagnosis Date  . Anemia in chronic kidney disease (CODE) 06/05/2015  . Arthritis      "bad in my legs" (12/07/2014)  . Atrial fibrillation ([place]) 06/05/2015  . Chronic back pain    "dr said my spine is crooked"  . Chronic bronchitis ([place])    "get it q yr" (12/07/2014)  . CKD stage 4 due to type 2 diabetes mellitus ([place]) 06/05/2015  . COPD (chronic obstructive pulmonary disease) ([place])   . Gait difficulty 09/02/2014  . Heart disease   . Hypercholesterolemia   . Hypertension   . Stroke ([place]) 05/2014   denies residual on 12/07/2014  . Stroke with cerebral ischemia ([place])   . Type II diabetes mellitus ([place])     Past Surgical History:  Procedure Laterality Date  . ABDOMINAL HYSTERECTOMY  ~ 1970  . APPENDECTOMY  ~ 1970  . JOINT REPLACEMENT    . TOTAL KNEE ARTHROPLASTY Right ~ 1986  . TUMOR EXCISION  ~ 1970   "9# in my stomach"    Social History   Social History  . Marital status: Widowed    Spouse name: N/A  . Number of children: 0  . Years of education: 9   Occupational History  . retired    Social History Main Topics  . Smoking status: Former Smoker    Packs/day: 0.50    Years: 50.00    Types: Cigarettes    Start date: 03/07/1953    Quit date: 01/14/2004  . Smokeless tobacco: Never Used  Comment: "quit smoking cigarettes  in ~ 2005"  . Alcohol use No     Comment: 10/242016 "quit drinking beer in ~ 1977"  . Drug use: No  . Sexual activity: No   Other Topics Concern  . Not on file   Social History Narrative   Patient drinks caffeine a few times a week.   Patient is right handed.     Vitals:   07/12/16 1036  BP: (!) 168/77  Pulse: 61  SpO2: 98%  Weight: 211 lb (95.7 kg)  Height: 5\' 6"  (1.676 m)    Wt Readings from Last 3 Encounters:  07/12/16 211 lb (95.7 kg)  05/24/16 211 lb 3.2 oz (95.8 kg)  05/08/16 208 lb 12.8 oz (94.7 kg)     PHYSICAL EXAM General: NAD HEENT: Normal. Neck: No JVD, no thyromegaly. Lungs: Clear to auscultation bilaterally with normal respiratory effort. CV: Nondisplaced PMI.  Regular rate and rhythm, normal  S1/S2, no S3/S4, no murmur. No pretibial or periankle edema.     Abdomen: Soft, nontender, no distention.  Neurologic: Alert and oriented.  Psych: Normal affect. Skin: Normal. Musculoskeletal: No gross deformities.    ECG: Most recent ECG reviewed.   Labs: Lab Results  Component Value Date/Time   K 5.1 05/24/2016 10:18 AM   BUN 52 (H) 05/24/2016 10:18 AM   CREATININE 2.50 (H) 05/24/2016 10:18 AM   ALT 24 04/25/2016 12:00 PM   TSH 2.217 10/03/2015 08:58 AM   TSH 1.247 05/28/2014 07:45 AM   HGB 9.2 (L) 07/05/2016 09:18 AM     Lipids: Lab Results  Component Value Date/Time   LDLCALC 92 10/04/2015 03:01 AM   CHOL 156 10/04/2015 03:01 AM   TRIG 51 10/04/2015 03:01 AM   HDL 54 10/04/2015 03:01 AM       ASSESSMENT AND PLAN: 1. Atrial fibrillation: Stable on long-acting diltiazem and low-dose Eliquis (due to anemia, hemoglobin 9.2 on 07/05/16).  2. Chronic diastolic heart failure: Euvolemic on Lasix 40 mg twice a day. Aim to control blood pressure.  3. Essential HTN: Remains elevated. I will increase hydralazine to 100 mg 3 times daily.  4. Non-sustained ventricular tachycardia: No Coreg or other beta blockers due to COPD. Continue diltiazem. No ischemia on stress testing October 2016.  5. Abnormal nuclear stress test October 2016: Probable artifact based on normal regional wall motion. Will monitor. Continue ASA 81 mg and statin.  6. Anemia due to CKD: Managed by hematology.    Disposition: Follow up 3 months.  Kate Sable, M.D., F.A.C.C.

## 2016-07-12 NOTE — Patient Instructions (Signed)
Medication Instructions:   Increase Hydralazine to 100mg  three times per day.  Continue all other medications.    Labwork: none  Testing/Procedures: none  Follow-Up: 3 months   Any Other Special Instructions Will Be Listed Below (If Applicable).  If you need a refill on your cardiac medications before your next appointment, please call your pharmacy.

## 2016-07-19 ENCOUNTER — Encounter (HOSPITAL_COMMUNITY): Payer: Medicare Other | Attending: Oncology

## 2016-07-19 ENCOUNTER — Encounter (HOSPITAL_COMMUNITY): Payer: Self-pay

## 2016-07-19 ENCOUNTER — Encounter (HOSPITAL_COMMUNITY): Payer: Medicare Other

## 2016-07-19 VITALS — BP 183/47 | HR 56 | Temp 97.7°F | Resp 18

## 2016-07-19 DIAGNOSIS — N184 Chronic kidney disease, stage 4 (severe): Secondary | ICD-10-CM

## 2016-07-19 DIAGNOSIS — D631 Anemia in chronic kidney disease: Secondary | ICD-10-CM

## 2016-07-19 DIAGNOSIS — N189 Chronic kidney disease, unspecified: Secondary | ICD-10-CM | POA: Diagnosis present

## 2016-07-19 DIAGNOSIS — D509 Iron deficiency anemia, unspecified: Secondary | ICD-10-CM | POA: Insufficient documentation

## 2016-07-19 LAB — CBC WITH DIFFERENTIAL/PLATELET
BASOS PCT: 0 %
Basophils Absolute: 0 10*3/uL (ref 0.0–0.1)
Eosinophils Absolute: 0.3 10*3/uL (ref 0.0–0.7)
Eosinophils Relative: 5 %
HEMATOCRIT: 28.5 % — AB (ref 36.0–46.0)
HEMOGLOBIN: 9.1 g/dL — AB (ref 12.0–15.0)
LYMPHS ABS: 1 10*3/uL (ref 0.7–4.0)
LYMPHS PCT: 18 %
MCH: 29.8 pg (ref 26.0–34.0)
MCHC: 31.9 g/dL (ref 30.0–36.0)
MCV: 93.4 fL (ref 78.0–100.0)
MONO ABS: 0.3 10*3/uL (ref 0.1–1.0)
MONOS PCT: 6 %
NEUTROS ABS: 4.1 10*3/uL (ref 1.7–7.7)
NEUTROS PCT: 71 %
Platelets: 136 10*3/uL — ABNORMAL LOW (ref 150–400)
RBC: 3.05 MIL/uL — ABNORMAL LOW (ref 3.87–5.11)
RDW: 16.9 % — AB (ref 11.5–15.5)
WBC: 5.8 10*3/uL (ref 4.0–10.5)

## 2016-07-19 LAB — FERRITIN: Ferritin: 80 ng/mL (ref 11–307)

## 2016-07-19 MED ORDER — DARBEPOETIN ALFA 60 MCG/0.3ML IJ SOSY
60.0000 ug | PREFILLED_SYRINGE | Freq: Once | INTRAMUSCULAR | Status: AC
  Administered 2016-07-19: 60 ug via SUBCUTANEOUS

## 2016-07-19 MED ORDER — DARBEPOETIN ALFA 60 MCG/0.3ML IJ SOSY
PREFILLED_SYRINGE | INTRAMUSCULAR | Status: AC
  Filled 2016-07-19: qty 0.3

## 2016-07-19 NOTE — Patient Instructions (Signed)
[place] at [facility] Discharge Instructions  RECOMMENDATIONS MADE BY THE CONSULTANT AND ANY TEST RESULTS WILL BE SENT TO YOUR REFERRING PHYSICIAN.  Aranesp injection today. Take your blood pressure medications every day as prescribed. Return as scheduled.   Thank you for choosing [place] at [facility] to provide your oncology and hematology care.  To afford each patient quality time with our provider, please arrive at least 15 minutes before your scheduled appointment time.    If you have a lab appointment with the [place] please come in thru the  Main Entrance and check in at the main information desk  You need to re-schedule your appointment should you arrive 10 or more minutes late.  We strive to give you quality time with our providers, and arriving late affects you and other patients whose appointments are after yours.  Also, if you no show three or more times for appointments you may be dismissed from the clinic at the providers discretion.     Again, thank you for choosing [facility].  Our hope is that these requests will decrease the amount of time that you wait before being seen by our physicians.       _____________________________________________________________  Should you have questions after your visit to [facility], please contact our office at [phone] between the hours of 8:30 a.m. and 4:30 p.m.  Voicemails left after 4:30 p.m. will not be returned until the following business day.  For prescription refill requests, have your pharmacy contact our office.       Resources For Cancer Patients and their Caregivers ? American Cancer Society: Can assist with transportation, wigs, general needs, runs Look Good Feel Better.        (870) 102-0726 ? Cancer Care: Provides financial assistance, online support groups, medication/co-pay assistance.  1-800-813-HOPE (830)760-6110) ? [place] Assists [place] cancer patients and their families through emotional , educational and financial support.  (949)590-5328 ? [place] DSS Where to apply for food stamps, Medicaid and utility assistance. 613 324 0057 ? RCATS: Transportation to medical appointments. 719-628-4937 ? Social Security Administration: May apply for disability if have a Stage IV cancer. 984-694-7809 (865) 559-4084 ? [employer], Disability and Transit Services: Assists with nutrition, care and transit needs. [place] Support Programs: @10RELATIVEDAYS @ > Cancer Support Group  2nd Tuesday of the month 1pm-2pm, Journey Room  > Creative Journey  3rd Tuesday of the month 1130am-1pm, Journey Room  > Look Good Feel Better  1st Wednesday of the month 10am-12 noon, Journey Room (Call [place] to register [phone])

## 2016-07-19 NOTE — Progress Notes (Signed)
Dr. Talbert Cage made aware of systolic BP - okay to give Aranesp today per MD.  It is questionable if pt has taken her blood pressure medications this morning.   Jody [name] presents today for injection per the provider's orders.  Aranesp administration without incident; see MAR for injection details.  Patient tolerated procedure well and without incident.  No questions or complaints noted at this time.  Discharged via wheelchair in c/o family.

## 2016-08-02 ENCOUNTER — Encounter (HOSPITAL_COMMUNITY): Payer: Medicare Other

## 2016-08-02 ENCOUNTER — Encounter (HOSPITAL_BASED_OUTPATIENT_CLINIC_OR_DEPARTMENT_OTHER): Payer: Medicare Other | Admitting: Oncology

## 2016-08-02 ENCOUNTER — Encounter (HOSPITAL_BASED_OUTPATIENT_CLINIC_OR_DEPARTMENT_OTHER): Payer: Medicare Other

## 2016-08-02 ENCOUNTER — Encounter (HOSPITAL_COMMUNITY): Payer: Self-pay

## 2016-08-02 VITALS — BP 155/63 | HR 57 | Temp 98.0°F | Resp 18 | Ht 66.0 in | Wt 207.0 lb

## 2016-08-02 DIAGNOSIS — D631 Anemia in chronic kidney disease: Secondary | ICD-10-CM

## 2016-08-02 DIAGNOSIS — N184 Chronic kidney disease, stage 4 (severe): Secondary | ICD-10-CM

## 2016-08-02 DIAGNOSIS — D509 Iron deficiency anemia, unspecified: Secondary | ICD-10-CM

## 2016-08-02 LAB — CBC WITH DIFFERENTIAL/PLATELET
Basophils Absolute: 0 10*3/uL (ref 0.0–0.1)
Basophils Relative: 0 %
EOS ABS: 0.1 10*3/uL (ref 0.0–0.7)
Eosinophils Relative: 2 %
HEMATOCRIT: 29 % — AB (ref 36.0–46.0)
HEMOGLOBIN: 9.4 g/dL — AB (ref 12.0–15.0)
LYMPHS ABS: 0.8 10*3/uL (ref 0.7–4.0)
Lymphocytes Relative: 15 %
MCH: 30.1 pg (ref 26.0–34.0)
MCHC: 32.4 g/dL (ref 30.0–36.0)
MCV: 92.9 fL (ref 78.0–100.0)
MONOS PCT: 7 %
Monocytes Absolute: 0.4 10*3/uL (ref 0.1–1.0)
NEUTROS PCT: 76 %
Neutro Abs: 4.1 10*3/uL (ref 1.7–7.7)
Platelets: 142 10*3/uL — ABNORMAL LOW (ref 150–400)
RBC: 3.12 MIL/uL — ABNORMAL LOW (ref 3.87–5.11)
RDW: 16.2 % — ABNORMAL HIGH (ref 11.5–15.5)
WBC: 5.4 10*3/uL (ref 4.0–10.5)

## 2016-08-02 LAB — FERRITIN: Ferritin: 72 ng/mL (ref 11–307)

## 2016-08-02 MED ORDER — DARBEPOETIN ALFA 60 MCG/0.3ML IJ SOSY
60.0000 ug | PREFILLED_SYRINGE | Freq: Once | INTRAMUSCULAR | Status: AC
  Administered 2016-08-02: 60 ug via SUBCUTANEOUS
  Filled 2016-08-02: qty 0.3

## 2016-08-02 NOTE — Progress Notes (Signed)
[place] [address], [place] [address]   CLINIC:  Medical Oncology/Hematology  PCP:  Monico Blitz, [place] [address] [phone]   REASON FOR VISIT:  Follow-up for anemia in the setting of chronic kidney disease   CURRENT THERAPY: Work-up for therapy consideration.     HISTORY OF PRESENT ILLNESS:  (From Kirby Crigler, PA-C's last note: 04/25/16)     INTERVAL HISTORY:  [name] 76 y.o. female here for routine follow-up for anemia due to stage IV chronic kidney disease.   Patient presents for follow-up today. She states that recently she went to go see the ophthalmologist and was found to have blood behind her left eye, which is likely the cause of her ongoing headaches. She states that her energy level is good, she denies any fatigue. She states that she has chronic lower extremity weakness, however she ambulates at home with a walker. She denies any shortness of breath, chest pain, abdominal pain.   REVIEW OF SYSTEMS:  Review of Systems  Constitutional: Negative for appetite change, chills, fatigue and fever.  HENT:  Negative.   Eyes: Negative.   Respiratory: Negative for cough and shortness of breath.        Chronic cough and dyspnea on exertion; inhaler helps symptoms   Cardiovascular: Negative for chest pain, leg swelling and palpitations.  Gastrointestinal: Negative.  Negative for abdominal pain, blood in stool, constipation, diarrhea, nausea and vomiting.  Endocrine: Negative.   Genitourinary: Negative.  Negative for dysuria, hematuria and vaginal bleeding.   Musculoskeletal: Positive for arthralgias.  Neurological: Positive for extremity weakness (in legs). Negative for dizziness and headaches.  Hematological: Negative.   Psychiatric/Behavioral: Negative for sleep disturbance.     PAST MEDICAL/SURGICAL HISTORY:  Past Medical History:  Diagnosis Date  . Anemia in chronic kidney disease (CODE) 06/05/2015  .  Arthritis    "bad in my legs" (12/07/2014)  . Atrial fibrillation ([place]) 06/05/2015  . Chronic back pain    "dr said my spine is crooked"  . Chronic bronchitis ([place])    "get it q yr" (12/07/2014)  . CKD stage 4 due to type 2 diabetes mellitus ([place]) 06/05/2015  . COPD (chronic obstructive pulmonary disease) ([place])   . Gait difficulty 09/02/2014  . Heart disease   . Hypercholesterolemia   . Hypertension   . Stroke ([place]) 05/2014   denies residual on 12/07/2014  . Stroke with cerebral ischemia ([place])   . Type II diabetes mellitus ([place])    Past Surgical History:  Procedure Laterality Date  . ABDOMINAL HYSTERECTOMY  ~ 1970  . APPENDECTOMY  ~ 1970  . JOINT REPLACEMENT    . TOTAL KNEE ARTHROPLASTY Right ~ 1986  . TUMOR EXCISION  ~ 1970   "9# in my stomach"     SOCIAL HISTORY:  Social History   Social History  . Marital status: Widowed    Spouse name: N/A  . Number of children: 0  . Years of education: 9   Occupational History  . retired    Social History Main Topics  . Smoking status: Former Smoker    Packs/day: 0.50    Years: 50.00    Types: Cigarettes    Start date: 03/07/1953    Quit date: 01/14/2004  . Smokeless tobacco: Never Used     Comment: "quit smoking cigarettes  in ~ 2005"  . Alcohol use No     Comment: 10/242016 "quit drinking beer in ~ 1977"  .  Drug use: No  . Sexual activity: No   Other Topics Concern  . Not on file   Social History Narrative   Patient drinks caffeine a few times a week.   Patient is right handed.    FAMILY HISTORY:  Family History  Problem Relation Age of Onset  . Cancer Other   . Heart attack Brother   . Cancer Sister        breast  . Alcohol abuse Brother     CURRENT MEDICATIONS:  Outpatient Encounter Prescriptions as of 08/02/2016  Medication Sig  . albuterol (PROVENTIL HFA;VENTOLIN HFA) 108 (90 BASE) MCG/ACT inhaler Inhale 1-2 puffs into the lungs every 6 (six) hours as needed for wheezing or shortness of breath. Reported  on 03/01/2015  . aspirin EC 81 MG tablet Take 81 mg by mouth daily.  [name] atorvastatin (LIPITOR) 40 MG tablet Take 1 tablet by mouth daily.  [name] diltiazem (CARDIZEM CD) 180 MG 24 hr capsule Take 1 capsule (180 mg total) by mouth daily.  [name] ELIQUIS 2.5 MG TABS tablet Take 1 tablet (2.5 mg total) by mouth 2 (two) times daily.  . fluticasone-salmeterol (ADVAIR HFA) 230-21 MCG/ACT inhaler Inhale 2 puffs into the lungs 2 (two) times daily.  . furosemide (LASIX) 40 MG tablet Take 1 tablet (40 mg total) by mouth 2 (two) times daily.  [name] gabapentin (NEURONTIN) 300 MG capsule Take 1 capsule by mouth daily.  . hydrALAZINE (APRESOLINE) 100 MG tablet Take 1 tablet (100 mg total) by mouth 3 (three) times daily.  . Insulin Degludec (TRESIBA FLEXTOUCH) 100 UNIT/ML SOPN Inject 14 Units into the skin daily.   . potassium chloride SA (K-DUR,KLOR-CON) 20 MEQ tablet Take 1 tablet (20 mEq total) by mouth daily.  . [EXPIRED] Darbepoetin Alfa (ARANESP) injection 60 mcg    No facility-administered encounter medications on file as of 08/02/2016.     ALLERGIES:  Allergies  Allergen Reactions  . Codeine Nausea And Vomiting     PHYSICAL EXAM:  ECOG Performance status: 2-3 - Symptomatic; requires assistance.   Vitals:   08/02/16 1117  BP: (!) 155/63  Pulse: (!) 57  Resp: 18  Temp: 98 F (36.7 C)   Filed Weights   08/02/16 1117  Weight: 207 lb (93.9 kg)    Physical Exam  Constitutional: She is oriented to person, place, and time and well-developed, well-nourished, and in no distress. No distress.  Examined seated in wheelchair   HENT:  Head: Normocephalic and atraumatic.  Mouth/Throat: Oropharynx is clear and moist. No oropharyngeal exudate.  Eyes: Conjunctivae are normal. Pupils are equal, round, and reactive to light. No scleral icterus.  Neck: Normal range of motion. Neck supple. No JVD present.  Cardiovascular: Normal rate, regular rhythm and normal heart sounds.  Exam reveals no gallop and no friction  rub.   No murmur heard. Bradycardia   Pulmonary/Chest: Effort normal and breath sounds normal. No respiratory distress. She has no wheezes. She has no rales.  Abdominal: Soft. Bowel sounds are normal. She exhibits no distension. There is no tenderness. There is no rebound and no guarding.  Musculoskeletal: She exhibits no edema or tenderness.  Lymphadenopathy:    She has no cervical adenopathy.       Right: No supraclavicular adenopathy present.       Left: No supraclavicular adenopathy present.  Neurological: She is alert and oriented to person, place, and time. No cranial nerve deficit.  Skin: Skin is warm and dry. No rash noted. No erythema. No pallor.  Psychiatric: Mood, memory, affect and judgment normal.  Nursing note and vitals reviewed.    LABORATORY DATA:  I have reviewed the labs as listed.  CBC    Component Value Date/Time   WBC 5.4 08/02/2016 0951   RBC 3.12 (L) 08/02/2016 0951   HGB 9.4 (L) 08/02/2016 0951   HCT 29.0 (L) 08/02/2016 0951   PLT 142 (L) 08/02/2016 0951   MCV 92.9 08/02/2016 0951   MCH 30.1 08/02/2016 0951   MCHC 32.4 08/02/2016 0951   RDW 16.2 (H) 08/02/2016 0951   LYMPHSABS 0.8 08/02/2016 0951   MONOABS 0.4 08/02/2016 0951   EOSABS 0.1 08/02/2016 0951   BASOSABS 0.0 08/02/2016 0951   CMP Latest Ref Rng & Units 05/24/2016 04/25/2016 10/05/2015  Glucose 65 - 99 mg/dL 100(H) 101(H) 137(H)  BUN 6 - 20 mg/dL 52(H) 61(H) 71(H)  Creatinine 0.44 - 1.00 mg/dL 2.50(H) 2.64(H) 2.71(H)  Sodium 135 - 145 mmol/L 139 137 139  Potassium 3.5 - 5.1 mmol/L 5.1 4.7 4.4  Chloride 101 - 111 mmol/L 110 108 106  CO2 22 - 32 mmol/L 20(L) 20(L) 22  Calcium 8.9 - 10.3 mg/dL 9.2 8.7(L) 9.1  Total Protein 6.5 - 8.1 g/dL - 7.6 -  Total Bilirubin 0.3 - 1.2 mg/dL - 0.7 -  Alkaline Phos 38 - 126 U/L - 89 -  AST 15 - 41 U/L - 25 -  ALT 14 - 54 U/L - 24 -   Results for OPHELIA, SIPE (MRN [id number])   Ref. Range 04/25/2016 12:00  Iron Latest Ref Range: 28 - 170 ug/dL 56    UIBC Latest Units: ug/dL 281  TIBC Latest Ref Range: 250 - 450 ug/dL 337  Saturation Ratios Latest Ref Range: 10.4 - 31.8 % 17  Ferritin Latest Ref Range: 11 - 307 ng/mL 84   Results for LORA, CHAVERS (MRN [id number])   Ref. Range 04/25/2016 12:01  Folate Latest Ref Range: >5.9 ng/mL 16.2   Results for ALYN, RIEDINGER (MRN [id number])   Ref. Range 04/25/2016 12:00  CRP Latest Ref Range: <1.0 mg/dL <0.8  Vitamin B12 Latest Ref Range: 180 - 914 pg/mL 441   Results for TISHANNA, DUNFORD (MRN [id number])  Ref. Range 04/25/2016 12:00  Basophils Absolute Latest Ref Range: 0.0 - 0.1 K/uL 0.0  RBC Morphology Unknown SLIGHT ANISOCYTOSIS  RBC. Latest Ref Range: 3.87 - 5.11 MIL/uL 2.79 (L)  Retic Ct Pct Latest Ref Range: 0.4 - 3.1 % 3.9 (H)  Retic Count, Manual Latest Ref Range: 19.0 - 186.0 K/uL 108.8   Results for ARISBEL, MAIONE (MRN [id number])  Ref. Range 04/25/2016 12:00  Haptoglobin Latest Ref Range: 34 - 200 mg/dL 147  Erythropoietin Latest Ref Range: 2.6 - 18.5 mIU/mL 32.5 (H)  Sed Rate Latest Ref Range: 0 - 22 mm/hr 124 (H)   Results for RACHELLE, EDWARDS (MRN [id number])  Ref. Range 04/25/2016 12:00  Hgb A Latest Ref Range: 96.4 - 98.8 % 97.8  Hgb A2 Quant Latest Ref Range: 1.8 - 3.2 % 2.2  Hgb F Quant Latest Ref Range: 0.0 - 2.0 % 0.0  Hgb S Quant Latest Ref Range: 0.0 % 0.0  HGB C Latest Ref Range: 0.0 % 0.0  HGB VARIANT Latest Ref Range: 0.0 % 0.0   Results for SANTANA, GOSDIN (MRN [id number])  Ref. Range 04/25/2016 12:01  Total Protein ELP Latest Ref Range: 6.0 - 8.5 g/dL 7.2  Albumin ELP Latest Ref Range: 2.9 - 4.4 g/dL 3.3  Globulin, Total Latest Ref Range: 2.2 - 3.9 g/dL  3.9  A/G Ratio Latest Ref Range: 0.7 - 1.7  0.8  Alpha-1-Globulin Latest Ref Range: 0.0 - 0.4 g/dL 0.2  Alpha-2-Globulin Latest Ref Range: 0.4 - 1.0 g/dL 0.8  Beta Globulin Latest Ref Range: 0.7 - 1.3 g/dL 1.0  Gamma Globulin Latest Ref Range: 0.4 - 1.8 g/dL 1.9 (H)  M-SPIKE, % Latest Ref Range: Not  Observed g/dL Not Observed         ASSESSMENT & PLAN:   Iron deficiency anemia:  -Ferritin low at 84; goal is ferritin >100 before initiating Aranesp.  -Received 1 dose of IV ferric gluconate 125 mg on 05/29/16. Iron studies pending for today; will follow up.  Anemia due to chronic kidney disease:  -Continue Aranesp injections every 2 weeks for hemoglobin 11 g/dL or less. Hemoglobin 9.4 g/dL today.  RTC in 3 months for follow up.    All questions were answered to patient's stated satisfaction. Encouraged patient to call with any new concerns or questions before her next visit to the cancer center and we can certain see her sooner, if needed.     Twana First, MD

## 2016-08-02 NOTE — Patient Instructions (Signed)
[facility] at [facility] Discharge Instructions  RECOMMENDATIONS MADE BY THE CONSULTANT AND ANY TEST RESULTS WILL BE SENT TO YOUR REFERRING PHYSICIAN.  Aranesp given Follow up as scheduled  Thank you for choosing [facility] at [facility] to provide your oncology and hematology care.  To afford each patient quality time with our provider, please arrive at least 15 minutes before your scheduled appointment time.    If you have a lab appointment with the Cancer Center please come in thru the  Main Entrance and check in at the main information desk  You need to re-schedule your appointment should you arrive 10 or more minutes late.  We strive to give you quality time with our providers, and arriving late affects you and other patients whose appointments are after yours.  Also, if you no show three or more times for appointments you may be dismissed from the clinic at the providers discretion.     Again, thank you for choosing [facility].  Our hope is that these requests will decrease the amount of time that you wait before being seen by our physicians.       _____________________________________________________________  Should you have questions after your visit to [facility], please contact our office at [phone] between the hours of 8:30 a.m. and 4:30 p.m.  Voicemails left after 4:30 p.m. will not be returned until the following business day.  For prescription refill requests, have your pharmacy contact our office.       Resources For Cancer Patients and their Caregivers ? American Cancer Society: Can assist with transportation, wigs, general needs, runs Look Good Feel Better.        1-888-227-6333 ? Cancer Care: Provides financial assistance, online support groups, medication/co-pay assistance.  1-800-813-HOPE (4673) ? [facility] Assists [place] cancer patients and their  families through emotional , educational and financial support.  336-427-4357 ? [place] DSS Where to apply for food stamps, Medicaid and utility assistance. 336-342-1394 ? RCATS: Transportation to medical appointments. 336-347-2287 ? Social Security Administration: May apply for disability if have a Stage IV cancer. 336-342-7796 1-800-772-1213 ? [place] Aging, Disability and Transit Services: Assists with nutrition, care and transit needs. 336-349-2343  Cancer Center Support Programs: @[email or web address]@ > Cancer Support Group  2nd Tuesday of the month 1pm-2pm, Journey Room  > Creative Journey  3rd Tuesday of the month 1130am-1pm, Journey Room  > Look Good Feel Better  1st Wednesday of the month 10am-12 noon, Journey Room (Call American Cancer Society to register 1-800-395-5775)   

## 2016-08-02 NOTE — Progress Notes (Signed)
Jody [name] presents today for injection per MD orders. Aranesp 60 mcg administered SQ in right Abdomen. Administration without incident. Patient tolerated well.

## 2016-08-03 ENCOUNTER — Other Ambulatory Visit (HOSPITAL_COMMUNITY): Payer: Self-pay | Admitting: Adult Health

## 2016-08-10 ENCOUNTER — Encounter (HOSPITAL_COMMUNITY): Payer: Self-pay

## 2016-08-10 ENCOUNTER — Encounter (HOSPITAL_BASED_OUTPATIENT_CLINIC_OR_DEPARTMENT_OTHER): Payer: Medicare Other

## 2016-08-10 VITALS — BP 160/51 | HR 55 | Temp 97.9°F | Resp 18

## 2016-08-10 DIAGNOSIS — N184 Chronic kidney disease, stage 4 (severe): Secondary | ICD-10-CM | POA: Diagnosis not present

## 2016-08-10 DIAGNOSIS — D631 Anemia in chronic kidney disease: Secondary | ICD-10-CM | POA: Diagnosis not present

## 2016-08-10 MED ORDER — NA FERRIC GLUC CPLX IN SUCROSE 12.5 MG/ML IV SOLN
125.0000 mg | Freq: Once | INTRAVENOUS | Status: AC
  Administered 2016-08-10: 125 mg via INTRAVENOUS
  Filled 2016-08-10: qty 10

## 2016-08-10 MED ORDER — SODIUM CHLORIDE 0.9 % IV SOLN
INTRAVENOUS | Status: DC
  Administered 2016-08-10: 11:00:00 via INTRAVENOUS

## 2016-08-10 MED ORDER — SODIUM CHLORIDE 0.9% FLUSH: 3.0000 mL | Freq: Once | INTRAVENOUS | Status: DC | PRN

## 2016-08-10 NOTE — Progress Notes (Signed)
Tolerated infusion w/o adverse reaction.  Alert, in no distress.  VSS.  Discharged via wheelchair in c/o aide.

## 2016-08-17 ENCOUNTER — Encounter (HOSPITAL_COMMUNITY): Payer: Self-pay

## 2016-08-17 ENCOUNTER — Encounter (HOSPITAL_COMMUNITY): Payer: Medicare Other

## 2016-08-17 ENCOUNTER — Encounter (HOSPITAL_COMMUNITY): Payer: Medicare Other | Attending: Oncology

## 2016-08-17 VITALS — BP 192/91 | HR 50 | Temp 98.0°F | Resp 18

## 2016-08-17 DIAGNOSIS — D631 Anemia in chronic kidney disease: Secondary | ICD-10-CM

## 2016-08-17 DIAGNOSIS — N189 Chronic kidney disease, unspecified: Secondary | ICD-10-CM

## 2016-08-17 DIAGNOSIS — D509 Iron deficiency anemia, unspecified: Secondary | ICD-10-CM | POA: Diagnosis not present

## 2016-08-17 LAB — CBC WITH DIFFERENTIAL/PLATELET
Basophils Absolute: 0 10*3/uL (ref 0.0–0.1)
Basophils Relative: 1 %
EOS PCT: 5 %
Eosinophils Absolute: 0.3 10*3/uL (ref 0.0–0.7)
HCT: 29.2 % — ABNORMAL LOW (ref 36.0–46.0)
Hemoglobin: 9.4 g/dL — ABNORMAL LOW (ref 12.0–15.0)
LYMPHS ABS: 0.8 10*3/uL (ref 0.7–4.0)
Lymphocytes Relative: 14 %
MCH: 30.1 pg (ref 26.0–34.0)
MCHC: 32.2 g/dL (ref 30.0–36.0)
MCV: 93.6 fL (ref 78.0–100.0)
MONO ABS: 0.4 10*3/uL (ref 0.1–1.0)
Monocytes Relative: 8 %
Neutro Abs: 4.1 10*3/uL (ref 1.7–7.7)
Neutrophils Relative %: 72 %
PLATELETS: 144 10*3/uL — AB (ref 150–400)
RBC: 3.12 MIL/uL — ABNORMAL LOW (ref 3.87–5.11)
RDW: 15.6 % — AB (ref 11.5–15.5)
WBC: 5.6 10*3/uL (ref 4.0–10.5)

## 2016-08-17 LAB — FERRITIN: Ferritin: 138 ng/mL (ref 11–307)

## 2016-08-17 MED ORDER — DARBEPOETIN ALFA 60 MCG/0.3ML IJ SOSY
60.0000 ug | PREFILLED_SYRINGE | Freq: Once | INTRAMUSCULAR | Status: AC
  Administered 2016-08-17: 60 ug via SUBCUTANEOUS
  Filled 2016-08-17: qty 0.3

## 2016-08-17 NOTE — Patient Instructions (Signed)
[facility] at [facility] Discharge Instructions  RECOMMENDATIONS MADE BY THE CONSULTANT AND ANY TEST RESULTS WILL BE SENT TO YOUR REFERRING PHYSICIAN.  Aranesp injection today. Return as scheduled.   Thank you for choosing [facility] at [facility] to provide your oncology and hematology care.  To afford each patient quality time with our provider, please arrive at least 15 minutes before your scheduled appointment time.    If you have a lab appointment with the Cancer Center please come in thru the  Main Entrance and check in at the main information desk  You need to re-schedule your appointment should you arrive 10 or more minutes late.  We strive to give you quality time with our providers, and arriving late affects you and other patients whose appointments are after yours.  Also, if you no show three or more times for appointments you may be dismissed from the clinic at the providers discretion.     Again, thank you for choosing [facility].  Our hope is that these requests will decrease the amount of time that you wait before being seen by our physicians.       _____________________________________________________________  Should you have questions after your visit to [facility], please contact our office at [phone] between the hours of 8:30 a.m. and 4:30 p.m.  Voicemails left after 4:30 p.m. will not be returned until the following business day.  For prescription refill requests, have your pharmacy contact our office.       Resources For Cancer Patients and their Caregivers ? American Cancer Society: Can assist with transportation, wigs, general needs, runs Look Good Feel Better.        1-888-227-6333 ? Cancer Care: Provides financial assistance, online support groups, medication/co-pay assistance.  1-800-813-HOPE (4673) ? [facility] Assists [place] cancer patients and  their families through emotional , educational and financial support.  336-427-4357 ? [place] DSS Where to apply for food stamps, Medicaid and utility assistance. 336-342-1394 ? RCATS: Transportation to medical appointments. 336-347-2287 ? Social Security Administration: May apply for disability if have a Stage IV cancer. 336-342-7796 1-800-772-1213 ? [place] Aging, Disability and Transit Services: Assists with nutrition, care and transit needs. 336-349-2343  Cancer Center Support Programs: @[email or web address]@ > Cancer Support Group  2nd Tuesday of the month 1pm-2pm, Journey Room  > Creative Journey  3rd Tuesday of the month 1130am-1pm, Journey Room  > Look Good Feel Better  1st Wednesday of the month 10am-12 noon, Journey Room (Call American Cancer Society to register 1-800-395-5775)   

## 2016-08-17 NOTE — Progress Notes (Signed)
Jody [name] presents today for injection per the provider's orders.  Aranesp administration without incident; see MAR for injection details.  Patient tolerated procedure well and without incident.  No questions or complaints noted at this time.  Discharged via wheelchair in c/o family.

## 2016-08-31 ENCOUNTER — Encounter (HOSPITAL_BASED_OUTPATIENT_CLINIC_OR_DEPARTMENT_OTHER): Payer: Medicare Other

## 2016-08-31 ENCOUNTER — Encounter (HOSPITAL_COMMUNITY): Payer: Medicare Other

## 2016-08-31 ENCOUNTER — Encounter (HOSPITAL_COMMUNITY): Payer: Self-pay

## 2016-08-31 VITALS — BP 184/72 | HR 48 | Temp 98.0°F | Resp 18

## 2016-08-31 DIAGNOSIS — D509 Iron deficiency anemia, unspecified: Secondary | ICD-10-CM

## 2016-08-31 DIAGNOSIS — N189 Chronic kidney disease, unspecified: Secondary | ICD-10-CM | POA: Diagnosis not present

## 2016-08-31 DIAGNOSIS — D631 Anemia in chronic kidney disease: Secondary | ICD-10-CM | POA: Diagnosis not present

## 2016-08-31 LAB — CBC WITH DIFFERENTIAL/PLATELET
BASOS ABS: 0 10*3/uL (ref 0.0–0.1)
BASOS PCT: 0 %
Eosinophils Absolute: 0.3 10*3/uL (ref 0.0–0.7)
Eosinophils Relative: 5 %
HEMATOCRIT: 29.4 % — AB (ref 36.0–46.0)
HEMOGLOBIN: 9.5 g/dL — AB (ref 12.0–15.0)
Lymphocytes Relative: 13 %
Lymphs Abs: 0.8 10*3/uL (ref 0.7–4.0)
MCH: 30.7 pg (ref 26.0–34.0)
MCHC: 32.3 g/dL (ref 30.0–36.0)
MCV: 95.1 fL (ref 78.0–100.0)
Monocytes Absolute: 0.5 10*3/uL (ref 0.1–1.0)
Monocytes Relative: 8 %
NEUTROS ABS: 4.4 10*3/uL (ref 1.7–7.7)
NEUTROS PCT: 74 %
Platelets: 143 10*3/uL — ABNORMAL LOW (ref 150–400)
RBC: 3.09 MIL/uL — AB (ref 3.87–5.11)
RDW: 16.7 % — ABNORMAL HIGH (ref 11.5–15.5)
WBC: 6 10*3/uL (ref 4.0–10.5)

## 2016-08-31 LAB — FERRITIN: FERRITIN: 110 ng/mL (ref 11–307)

## 2016-08-31 MED ORDER — DARBEPOETIN ALFA 60 MCG/0.3ML IJ SOSY
60.0000 ug | PREFILLED_SYRINGE | Freq: Once | INTRAMUSCULAR | Status: AC
  Administered 2016-08-31: 60 ug via SUBCUTANEOUS

## 2016-08-31 MED ORDER — DARBEPOETIN ALFA 60 MCG/0.3ML IJ SOSY
PREFILLED_SYRINGE | INTRAMUSCULAR | Status: AC
  Filled 2016-08-31: qty 0.3

## 2016-08-31 NOTE — Progress Notes (Signed)
Jody [name] presents today for injection per the provider's orders.  Aranesp administration without incident; see MAR for injection details.  Patient tolerated procedure well and without incident.  No questions or complaints noted at this time. Discharged via wheelchair in c/o caregiver.

## 2016-09-14 ENCOUNTER — Encounter (HOSPITAL_COMMUNITY): Payer: Self-pay

## 2016-09-14 ENCOUNTER — Encounter (HOSPITAL_COMMUNITY): Payer: Medicare Other | Attending: Oncology

## 2016-09-14 ENCOUNTER — Encounter (HOSPITAL_COMMUNITY): Payer: Medicare Other

## 2016-09-14 VITALS — BP 191/77 | HR 50 | Temp 98.0°F | Resp 18

## 2016-09-14 DIAGNOSIS — D631 Anemia in chronic kidney disease: Secondary | ICD-10-CM

## 2016-09-14 DIAGNOSIS — N189 Chronic kidney disease, unspecified: Secondary | ICD-10-CM | POA: Diagnosis not present

## 2016-09-14 DIAGNOSIS — D509 Iron deficiency anemia, unspecified: Secondary | ICD-10-CM | POA: Insufficient documentation

## 2016-09-14 LAB — CBC WITH DIFFERENTIAL/PLATELET
BASOS ABS: 0 10*3/uL (ref 0.0–0.1)
Basophils Relative: 0 %
Eosinophils Absolute: 0.3 10*3/uL (ref 0.0–0.7)
Eosinophils Relative: 7 %
HCT: 29.2 % — ABNORMAL LOW (ref 36.0–46.0)
HEMOGLOBIN: 9.3 g/dL — AB (ref 12.0–15.0)
LYMPHS ABS: 1 10*3/uL (ref 0.7–4.0)
LYMPHS PCT: 21 %
MCH: 30.4 pg (ref 26.0–34.0)
MCHC: 31.8 g/dL (ref 30.0–36.0)
MCV: 95.4 fL (ref 78.0–100.0)
Monocytes Absolute: 0.4 10*3/uL (ref 0.1–1.0)
Monocytes Relative: 7 %
NEUTROS ABS: 3.2 10*3/uL (ref 1.7–7.7)
NEUTROS PCT: 65 %
PLATELETS: 162 10*3/uL (ref 150–400)
RBC: 3.06 MIL/uL — AB (ref 3.87–5.11)
RDW: 16.3 % — ABNORMAL HIGH (ref 11.5–15.5)
WBC: 4.9 10*3/uL (ref 4.0–10.5)

## 2016-09-14 LAB — FERRITIN: FERRITIN: 98 ng/mL (ref 11–307)

## 2016-09-14 MED ORDER — DARBEPOETIN ALFA 60 MCG/0.3ML IJ SOSY
PREFILLED_SYRINGE | INTRAMUSCULAR | Status: AC
Start: 2016-09-14 — End: ?
  Filled 2016-09-14: qty 0.3

## 2016-09-14 MED ORDER — DARBEPOETIN ALFA 60 MCG/0.3ML IJ SOSY
60.0000 ug | PREFILLED_SYRINGE | Freq: Once | INTRAMUSCULAR | Status: AC
  Administered 2016-09-14: 60 ug via SUBCUTANEOUS

## 2016-09-14 NOTE — Patient Instructions (Signed)
[facility] at [facility] Discharge Instructions  RECOMMENDATIONS MADE BY THE CONSULTANT AND ANY TEST RESULTS WILL BE SENT TO YOUR REFERRING PHYSICIAN.  Received Aranesp injection today. Follow-up as scheduled. Call clinic for any questions or concerns  Thank you for choosing [facility] at [facility] to provide your oncology and hematology care.  To afford each patient quality time with our provider, please arrive at least 15 minutes before your scheduled appointment time.    If you have a lab appointment with the Cancer Center please come in thru the  Main Entrance and check in at the main information desk  You need to re-schedule your appointment should you arrive 10 or more minutes late.  We strive to give you quality time with our providers, and arriving late affects you and other patients whose appointments are after yours.  Also, if you no show three or more times for appointments you may be dismissed from the clinic at the providers discretion.     Again, thank you for choosing [facility].  Our hope is that these requests will decrease the amount of time that you wait before being seen by our physicians.       _____________________________________________________________  Should you have questions after your visit to  Cancer Center, please contact our office at [phone] between the hours of 8:30 a.m. and 4:30 p.m.  Voicemails left after 4:30 p.m. will not be returned until the following business day.  For prescription refill requests, have your pharmacy contact our office.       Resources For Cancer Patients and their Caregivers ? American Cancer Society: Can assist with transportation, wigs, general needs, runs Look Good Feel Better.        1-888-227-6333 ? Cancer Care: Provides financial assistance, online support groups, medication/co-pay assistance.  1-800-813-HOPE (4673) ? [facility] Assists [place] cancer patients and their families through emotional , educational and financial support.  336-427-4357 ? [place] DSS Where to apply for food stamps, Medicaid and utility assistance. 336-342-1394 ? RCATS: Transportation to medical appointments. 336-347-2287 ? Social Security Administration: May apply for disability if have a Stage IV cancer. 336-342-7796 1-800-772-1213 ? [place] Aging, Disability and Transit Services: Assists with nutrition, care and transit needs. 336-349-2343  Cancer Center Support Programs: @[email or web address]@ > Cancer Support Group  2nd Tuesday of the month 1pm-2pm, Journey Room  > Creative Journey  3rd Tuesday of the month 1130am-1pm, Journey Room  > Look Good Feel Better  1st Wednesday of the month 10am-12 noon, Journey Room (Call American Cancer Society to register 1-800-395-5775)   

## 2016-09-14 NOTE — Progress Notes (Signed)
Jody [name] tolerated Aranesp injection well without complaints or incident. Hgb 9.3. B/P 205/81 then rechecked 191/77 which has been the pt's norm for the past few weeks. Pt reports that she did take her B/P medications this am. Reviewed this information with Mike Craze NP who approved for pt to receive her Aranesp injection today and F/U with her PCP regarding her B/P as soon as possible. Pt verbalized understanding. Pt discharged via wheelchair in satisfactory condition accompanied by caregiver

## 2016-09-16 ENCOUNTER — Other Ambulatory Visit (HOSPITAL_COMMUNITY): Payer: Self-pay | Admitting: Adult Health

## 2016-09-26 ENCOUNTER — Encounter (HOSPITAL_BASED_OUTPATIENT_CLINIC_OR_DEPARTMENT_OTHER): Payer: Medicare Other

## 2016-09-26 ENCOUNTER — Encounter (HOSPITAL_COMMUNITY): Payer: Self-pay

## 2016-09-26 VITALS — BP 218/82 | HR 56 | Temp 98.0°F | Resp 18

## 2016-09-26 DIAGNOSIS — N183 Chronic kidney disease, stage 3 (moderate): Secondary | ICD-10-CM | POA: Diagnosis not present

## 2016-09-26 DIAGNOSIS — D631 Anemia in chronic kidney disease: Secondary | ICD-10-CM

## 2016-09-26 MED ORDER — SODIUM CHLORIDE 0.9 % IV SOLN
INTRAVENOUS | Status: DC
  Administered 2016-09-26: 12:00:00 via INTRAVENOUS

## 2016-09-26 MED ORDER — SODIUM CHLORIDE 0.9 % IV SOLN
750.0000 mg | Freq: Once | INTRAVENOUS | Status: AC
  Administered 2016-09-26: 750 mg via INTRAVENOUS
  Filled 2016-09-26: qty 15

## 2016-09-26 NOTE — Progress Notes (Signed)
Tolerated infusion w/o adverse reaction.  Alert, in no distress.  VSS.  Discharged via wheelchair in c/o caregiver.  

## 2016-09-28 ENCOUNTER — Other Ambulatory Visit (HOSPITAL_COMMUNITY): Payer: Self-pay | Admitting: Adult Health

## 2016-09-28 ENCOUNTER — Encounter (HOSPITAL_COMMUNITY): Payer: Self-pay

## 2016-09-28 ENCOUNTER — Encounter (HOSPITAL_BASED_OUTPATIENT_CLINIC_OR_DEPARTMENT_OTHER): Payer: Medicare Other

## 2016-09-28 ENCOUNTER — Encounter (HOSPITAL_COMMUNITY): Payer: Medicare Other

## 2016-09-28 VITALS — BP 177/86 | HR 57 | Temp 98.2°F | Resp 18

## 2016-09-28 DIAGNOSIS — D631 Anemia in chronic kidney disease: Secondary | ICD-10-CM

## 2016-09-28 DIAGNOSIS — D509 Iron deficiency anemia, unspecified: Secondary | ICD-10-CM

## 2016-09-28 DIAGNOSIS — N184 Chronic kidney disease, stage 4 (severe): Secondary | ICD-10-CM | POA: Diagnosis not present

## 2016-09-28 LAB — FERRITIN: Ferritin: 260 ng/mL (ref 11–307)

## 2016-09-28 LAB — CBC WITH DIFFERENTIAL/PLATELET
BASOS ABS: 0 10*3/uL (ref 0.0–0.1)
BASOS PCT: 0 %
EOS ABS: 0.3 10*3/uL (ref 0.0–0.7)
Eosinophils Relative: 5 %
HEMATOCRIT: 27.8 % — AB (ref 36.0–46.0)
HEMOGLOBIN: 9 g/dL — AB (ref 12.0–15.0)
Lymphocytes Relative: 17 %
Lymphs Abs: 0.9 10*3/uL (ref 0.7–4.0)
MCH: 30.5 pg (ref 26.0–34.0)
MCHC: 32.4 g/dL (ref 30.0–36.0)
MCV: 94.2 fL (ref 78.0–100.0)
MONO ABS: 0.4 10*3/uL (ref 0.1–1.0)
MONOS PCT: 7 %
Neutro Abs: 3.7 10*3/uL (ref 1.7–7.7)
Neutrophils Relative %: 71 %
Platelets: 128 10*3/uL — ABNORMAL LOW (ref 150–400)
RBC: 2.95 MIL/uL — ABNORMAL LOW (ref 3.87–5.11)
RDW: 16.4 % — ABNORMAL HIGH (ref 11.5–15.5)
WBC: 5.3 10*3/uL (ref 4.0–10.5)

## 2016-09-28 MED ORDER — DARBEPOETIN ALFA 60 MCG/0.3ML IJ SOSY
PREFILLED_SYRINGE | INTRAMUSCULAR | Status: AC
  Filled 2016-09-28: qty 0.3

## 2016-09-28 MED ORDER — DARBEPOETIN ALFA 60 MCG/0.3ML IJ SOSY
60.0000 ug | PREFILLED_SYRINGE | Freq: Once | INTRAMUSCULAR | Status: AC
  Administered 2016-09-28: 60 ug via SUBCUTANEOUS

## 2016-09-28 NOTE — Progress Notes (Signed)
Jody [name] presents today for injection per the provider's orders.  Aranesp administration without incident; see MAR for injection details.  Patient tolerated procedure well and without incident.  No questions or complaints noted at this time. Discharged via wheelchair in c/o caregiver.

## 2016-10-12 ENCOUNTER — Encounter: Payer: Self-pay | Admitting: Cardiovascular Disease

## 2016-10-12 ENCOUNTER — Other Ambulatory Visit (HOSPITAL_COMMUNITY): Payer: Medicare Other

## 2016-10-12 ENCOUNTER — Ambulatory Visit (HOSPITAL_COMMUNITY): Payer: Medicare Other

## 2016-10-12 ENCOUNTER — Ambulatory Visit (INDEPENDENT_AMBULATORY_CARE_PROVIDER_SITE_OTHER): Payer: Medicare Other | Admitting: Cardiovascular Disease

## 2016-10-12 VITALS — BP 204/78 | HR 55 | Ht 67.0 in | Wt 216.0 lb

## 2016-10-12 DIAGNOSIS — I472 Ventricular tachycardia: Secondary | ICD-10-CM

## 2016-10-12 DIAGNOSIS — I10 Essential (primary) hypertension: Secondary | ICD-10-CM | POA: Diagnosis not present

## 2016-10-12 DIAGNOSIS — R931 Abnormal findings on diagnostic imaging of heart and coronary circulation: Secondary | ICD-10-CM

## 2016-10-12 DIAGNOSIS — I5032 Chronic diastolic (congestive) heart failure: Secondary | ICD-10-CM | POA: Diagnosis not present

## 2016-10-12 DIAGNOSIS — D631 Anemia in chronic kidney disease: Secondary | ICD-10-CM

## 2016-10-12 DIAGNOSIS — I480 Paroxysmal atrial fibrillation: Secondary | ICD-10-CM

## 2016-10-12 DIAGNOSIS — N189 Chronic kidney disease, unspecified: Secondary | ICD-10-CM

## 2016-10-12 DIAGNOSIS — I4729 Other ventricular tachycardia: Secondary | ICD-10-CM

## 2016-10-12 MED ORDER — CLONIDINE HCL 0.1 MG PO TABS: 0.1000 mg | ORAL_TABLET | Freq: Two times a day (BID) | ORAL | 6 refills | Status: DC

## 2016-10-12 NOTE — Patient Instructions (Signed)
Medication Instructions:   Begin Clonidine 0.1mg  twice a day.  Continue all other current medications.  Labwork: none  Testing/Procedures: none  Follow-Up: 6 weeks   Any Other Special Instructions Will Be Listed Below (If Applicable).  If you need a refill on your cardiac medications before your next appointment, please call your pharmacy.

## 2016-10-12 NOTE — Progress Notes (Signed)
SUBJECTIVE: The patient presents for follow-up of malignant hypertension. Blood pressure is 204/78. I increased hydralazine to 100 mg three times daily on 07/12/16.  She also has a history of atrial fibrillation, chronic diastolic heart failure, and nonsustained ventricular tachycardia.  She went for Aranesp injections on 09/14/16. Blood pressure was 205/81 and then when rechecked it was 191/77. She tells me she had chest pain for about 5-10 minutes.  ECG performed in the office today which I personally interpreted demonstrated sinus bradycardia, 51 bpm, with a nonspecific T wave abnormality.    Review of Systems: As per "subjective", otherwise negative.  Allergies  Allergen Reactions  . Codeine Nausea And Vomiting    Current Outpatient Prescriptions  Medication Sig Dispense Refill  . albuterol (PROVENTIL HFA;VENTOLIN HFA) 108 (90 BASE) MCG/ACT inhaler Inhale 1-2 puffs into the lungs every 6 (six) hours as needed for wheezing or shortness of breath. Reported on 03/01/2015    . aspirin EC 81 MG tablet Take 81 mg by mouth daily.    [name] atorvastatin (LIPITOR) 40 MG tablet Take 1 tablet by mouth daily.  0  . diltiazem (CARDIZEM CD) 180 MG 24 hr capsule Take 1 capsule (180 mg total) by mouth daily. 30 capsule 3  . ELIQUIS 2.5 MG TABS tablet Take 1 tablet (2.5 mg total) by mouth 2 (two) times daily. 60 tablet 3  . fluticasone-salmeterol (ADVAIR HFA) 230-21 MCG/ACT inhaler Inhale 2 puffs into the lungs 2 (two) times daily.    . furosemide (LASIX) 40 MG tablet Take 1 tablet (40 mg total) by mouth 2 (two) times daily. 60 tablet 3  . gabapentin (NEURONTIN) 300 MG capsule Take 1 capsule by mouth daily.  0  . hydrALAZINE (APRESOLINE) 100 MG tablet Take 1 tablet (100 mg total) by mouth 3 (three) times daily. 270 tablet 3  . Insulin Degludec (TRESIBA FLEXTOUCH) 100 UNIT/ML SOPN Inject 14 Units into the skin daily.     . potassium chloride SA (K-DUR,KLOR-CON) 20 MEQ tablet Take 1 tablet (20 mEq  total) by mouth daily. 60 tablet 3   No current facility-administered medications for this visit.     Past Medical History:  Diagnosis Date  . Anemia in chronic kidney disease (CODE) 06/05/2015  . Arthritis    "bad in my legs" (12/07/2014)  . Atrial fibrillation ([place]) 06/05/2015  . Chronic back pain    "dr said my spine is crooked"  . Chronic bronchitis ([place])    "get it q yr" (12/07/2014)  . CKD stage 4 due to type 2 diabetes mellitus ([place]) 06/05/2015  . COPD (chronic obstructive pulmonary disease) ([place])   . Gait difficulty 09/02/2014  . Heart disease   . Hypercholesterolemia   . Hypertension   . Stroke ([place]) 05/2014   denies residual on 12/07/2014  . Stroke with cerebral ischemia ([place])   . Type II diabetes mellitus ([place])     Past Surgical History:  Procedure Laterality Date  . ABDOMINAL HYSTERECTOMY  ~ 1970  . APPENDECTOMY  ~ 1970  . JOINT REPLACEMENT    . TOTAL KNEE ARTHROPLASTY Right ~ 1986  . TUMOR EXCISION  ~ 1970   "9# in my stomach"    Social History   Social History  . Marital status: Widowed    Spouse name: N/A  . Number of children: 0  . Years of education: 9   Occupational History  . retired    Social History Main Topics  . Smoking status: Former Smoker  Packs/day: 0.50    Years: 50.00    Types: Cigarettes    Start date: 03/07/1953    Quit date: 01/14/2004  . Smokeless tobacco: Never Used     Comment: "quit smoking cigarettes  in ~ 2005"  . Alcohol use No     Comment: 10/242016 "quit drinking beer in ~ 1977"  . Drug use: No  . Sexual activity: No   Other Topics Concern  . Not on file   Social History Narrative   Patient drinks caffeine a few times a week.   Patient is right handed.     Vitals:   10/12/16 1026  BP: (!) 204/78  Pulse: (!) 55  SpO2: 98%  Weight: 216 lb (98 kg)  Height: 5\' 7"  (1.702 m)    Wt Readings from Last 3 Encounters:  10/12/16 216 lb (98 kg)  08/02/16 207 lb (93.9 kg)  07/12/16 211 lb (95.7 kg)      PHYSICAL EXAM General: NAD HEENT: Normal. Neck: No JVD, no thyromegaly. Lungs: Clear to auscultation bilaterally with normal respiratory effort. CV: Nondisplaced PMI.  Bradycardic, regular rhythm, normal S1/S2, no S3/S4, no murmur. No pretibial or periankle edema.   Abdomen: Soft, nontender, no distention.  Neurologic: Alert and oriented.  Psych: Normal affect. Skin: Normal. Musculoskeletal: No gross deformities.    ECG: Most recent ECG reviewed.   Labs: Lab Results  Component Value Date/Time   K 5.1 05/24/2016 10:18 AM   BUN 52 (H) 05/24/2016 10:18 AM   CREATININE 2.50 (H) 05/24/2016 10:18 AM   ALT 24 04/25/2016 12:00 PM   TSH 2.217 10/03/2015 08:58 AM   TSH 1.247 05/28/2014 07:45 AM   HGB 9.0 (L) 09/28/2016 10:45 AM     Lipids: Lab Results  Component Value Date/Time   LDLCALC 92 10/04/2015 03:01 AM   CHOL 156 10/04/2015 03:01 AM   TRIG 51 10/04/2015 03:01 AM   HDL 54 10/04/2015 03:01 AM       ASSESSMENT AND PLAN:  1. Atrial fibrillation: Stable on long-acting diltiazem and low-dose Eliquis (due to anemia, hemoglobin 9 on 09/28/16).  2. Chronic diastolic heart failure: Euvolemic on Lasix 40 mg twice a day. Aim to control blood pressure.  3. Malignant HTN: Remains elevated on long-acting diltiazem 180 mg daily and hydralazine 100 mg three times daily. Creatinine 2.5 on 05/24/16 so I am unable to start ACE inhibitors, angiotensin receptor blockers nor diuretics or spironolactone. I will start clonidine 0.1 mg bid and carefully monitor for hypotension and further bradycardia. I may have to reduce the dose of diltiazem in the future.  4. Non-sustained ventricular tachycardia: No Coreg or other beta blockers due to COPD. Continue diltiazem. No ischemia on stress testing October 2016.  5. Abnormal nuclear stress test October 2016: Probable artifact based on normal regional wall motion. Will monitor. Continue ASA 81 mg and statin.  6. Anemia due to CKD: Managed  by hematology.      Disposition: Follow up 6 weeks.   Kate Sable, M.D., F.A.C.C.

## 2016-10-12 NOTE — Addendum Note (Signed)
Addended by: Laurine Blazer on: 10/12/2016 11:09 AM   Modules accepted: Orders

## 2016-10-13 ENCOUNTER — Encounter (HOSPITAL_BASED_OUTPATIENT_CLINIC_OR_DEPARTMENT_OTHER): Payer: Medicare Other

## 2016-10-13 ENCOUNTER — Encounter (HOSPITAL_COMMUNITY): Payer: Self-pay

## 2016-10-13 ENCOUNTER — Encounter (HOSPITAL_COMMUNITY): Payer: Medicare Other

## 2016-10-13 VITALS — BP 130/62 | HR 56 | Temp 98.4°F

## 2016-10-13 DIAGNOSIS — D631 Anemia in chronic kidney disease: Secondary | ICD-10-CM

## 2016-10-13 DIAGNOSIS — N184 Chronic kidney disease, stage 4 (severe): Secondary | ICD-10-CM | POA: Diagnosis not present

## 2016-10-13 DIAGNOSIS — D509 Iron deficiency anemia, unspecified: Secondary | ICD-10-CM

## 2016-10-13 LAB — CBC WITH DIFFERENTIAL/PLATELET
BASOS PCT: 0 %
Basophils Absolute: 0 10*3/uL (ref 0.0–0.1)
EOS ABS: 0.3 10*3/uL (ref 0.0–0.7)
EOS PCT: 4 %
HCT: 25.8 % — ABNORMAL LOW (ref 36.0–46.0)
HEMOGLOBIN: 8.5 g/dL — AB (ref 12.0–15.0)
Lymphocytes Relative: 12 %
Lymphs Abs: 0.9 10*3/uL (ref 0.7–4.0)
MCH: 31.3 pg (ref 26.0–34.0)
MCHC: 32.9 g/dL (ref 30.0–36.0)
MCV: 94.9 fL (ref 78.0–100.0)
MONO ABS: 0.5 10*3/uL (ref 0.1–1.0)
MONOS PCT: 7 %
NEUTROS PCT: 77 %
Neutro Abs: 5.3 10*3/uL (ref 1.7–7.7)
PLATELETS: 125 10*3/uL — AB (ref 150–400)
RBC: 2.72 MIL/uL — ABNORMAL LOW (ref 3.87–5.11)
RDW: 17 % — AB (ref 11.5–15.5)
WBC: 6.9 10*3/uL (ref 4.0–10.5)

## 2016-10-13 LAB — FERRITIN: Ferritin: 418 ng/mL — ABNORMAL HIGH (ref 11–307)

## 2016-10-13 MED ORDER — DARBEPOETIN ALFA 100 MCG/0.5ML IJ SOSY
PREFILLED_SYRINGE | INTRAMUSCULAR | Status: AC
  Filled 2016-10-13: qty 0.5

## 2016-10-13 MED ORDER — DARBEPOETIN ALFA 100 MCG/0.5ML IJ SOSY
75.0000 ug | PREFILLED_SYRINGE | Freq: Once | INTRAMUSCULAR | Status: AC
  Administered 2016-10-13: 76 ug via SUBCUTANEOUS

## 2016-10-13 NOTE — Progress Notes (Signed)
Last three results of Hemoglobin reviewed with Mike Craze NP. Will give Aranesp per orders. Jody [name] presents today for injection per MD orders. Aranesp 84mcg administered IM in right Abdomen. Administration without incident. Patient tolerated well. Treatment given per orders. Patient tolerated it well without problems. Vitals stable and discharged home from clinic via wheelchair. Follow up as scheduled.

## 2016-10-15 ENCOUNTER — Inpatient Hospital Stay (HOSPITAL_COMMUNITY)
Admission: EM | Admit: 2016-10-15 | Discharge: 2016-10-18 | DRG: 191 | Disposition: A | Discharge status: Home / Self Care | Payer: Medicare Other | Attending: Internal Medicine | Admitting: Internal Medicine

## 2016-10-15 ENCOUNTER — Other Ambulatory Visit: Payer: Self-pay

## 2016-10-15 ENCOUNTER — Emergency Department (HOSPITAL_COMMUNITY): Payer: Medicare Other

## 2016-10-15 ENCOUNTER — Encounter (HOSPITAL_COMMUNITY): Payer: Self-pay | Admitting: Cardiology

## 2016-10-15 DIAGNOSIS — M549 Dorsalgia, unspecified: Secondary | ICD-10-CM | POA: Diagnosis present

## 2016-10-15 DIAGNOSIS — E785 Hyperlipidemia, unspecified: Secondary | ICD-10-CM | POA: Diagnosis present

## 2016-10-15 DIAGNOSIS — N179 Acute kidney failure, unspecified: Secondary | ICD-10-CM | POA: Diagnosis present

## 2016-10-15 DIAGNOSIS — E875 Hyperkalemia: Secondary | ICD-10-CM | POA: Diagnosis present

## 2016-10-15 DIAGNOSIS — Z87891 Personal history of nicotine dependence: Secondary | ICD-10-CM

## 2016-10-15 DIAGNOSIS — R748 Abnormal levels of other serum enzymes: Secondary | ICD-10-CM | POA: Diagnosis present

## 2016-10-15 DIAGNOSIS — I10 Essential (primary) hypertension: Secondary | ICD-10-CM | POA: Diagnosis present

## 2016-10-15 DIAGNOSIS — I5032 Chronic diastolic (congestive) heart failure: Secondary | ICD-10-CM | POA: Diagnosis present

## 2016-10-15 DIAGNOSIS — N184 Chronic kidney disease, stage 4 (severe): Secondary | ICD-10-CM | POA: Diagnosis not present

## 2016-10-15 DIAGNOSIS — I5043 Acute on chronic combined systolic (congestive) and diastolic (congestive) heart failure: Secondary | ICD-10-CM

## 2016-10-15 DIAGNOSIS — J449 Chronic obstructive pulmonary disease, unspecified: Secondary | ICD-10-CM | POA: Diagnosis present

## 2016-10-15 DIAGNOSIS — I482 Chronic atrial fibrillation: Secondary | ICD-10-CM | POA: Diagnosis present

## 2016-10-15 DIAGNOSIS — Z794 Long term (current) use of insulin: Secondary | ICD-10-CM

## 2016-10-15 DIAGNOSIS — N189 Chronic kidney disease, unspecified: Secondary | ICD-10-CM

## 2016-10-15 DIAGNOSIS — E1140 Type 2 diabetes mellitus with diabetic neuropathy, unspecified: Secondary | ICD-10-CM | POA: Diagnosis present

## 2016-10-15 DIAGNOSIS — E1121 Type 2 diabetes mellitus with diabetic nephropathy: Secondary | ICD-10-CM

## 2016-10-15 DIAGNOSIS — R778 Other specified abnormalities of plasma proteins: Secondary | ICD-10-CM

## 2016-10-15 DIAGNOSIS — Z7901 Long term (current) use of anticoagulants: Secondary | ICD-10-CM

## 2016-10-15 DIAGNOSIS — Z7982 Long term (current) use of aspirin: Secondary | ICD-10-CM

## 2016-10-15 DIAGNOSIS — I248 Other forms of acute ischemic heart disease: Secondary | ICD-10-CM | POA: Diagnosis present

## 2016-10-15 DIAGNOSIS — Z811 Family history of alcohol abuse and dependence: Secondary | ICD-10-CM

## 2016-10-15 DIAGNOSIS — Z79899 Other long term (current) drug therapy: Secondary | ICD-10-CM

## 2016-10-15 DIAGNOSIS — D631 Anemia in chronic kidney disease: Secondary | ICD-10-CM | POA: Diagnosis present

## 2016-10-15 DIAGNOSIS — I5042 Chronic combined systolic (congestive) and diastolic (congestive) heart failure: Secondary | ICD-10-CM

## 2016-10-15 DIAGNOSIS — I5033 Acute on chronic diastolic (congestive) heart failure: Secondary | ICD-10-CM

## 2016-10-15 DIAGNOSIS — Z8249 Family history of ischemic heart disease and other diseases of the circulatory system: Secondary | ICD-10-CM | POA: Diagnosis not present

## 2016-10-15 DIAGNOSIS — Z803 Family history of malignant neoplasm of breast: Secondary | ICD-10-CM | POA: Diagnosis not present

## 2016-10-15 DIAGNOSIS — J441 Chronic obstructive pulmonary disease with (acute) exacerbation: Secondary | ICD-10-CM | POA: Diagnosis present

## 2016-10-15 DIAGNOSIS — E1159 Type 2 diabetes mellitus with other circulatory complications: Secondary | ICD-10-CM | POA: Diagnosis present

## 2016-10-15 DIAGNOSIS — I130 Hypertensive heart and chronic kidney disease with heart failure and stage 1 through stage 4 chronic kidney disease, or unspecified chronic kidney disease: Secondary | ICD-10-CM | POA: Diagnosis present

## 2016-10-15 DIAGNOSIS — R001 Bradycardia, unspecified: Secondary | ICD-10-CM

## 2016-10-15 DIAGNOSIS — R7989 Other specified abnormal findings of blood chemistry: Secondary | ICD-10-CM

## 2016-10-15 DIAGNOSIS — Z8673 Personal history of transient ischemic attack (TIA), and cerebral infarction without residual deficits: Secondary | ICD-10-CM

## 2016-10-15 DIAGNOSIS — N186 End stage renal disease: Secondary | ICD-10-CM

## 2016-10-15 DIAGNOSIS — G8929 Other chronic pain: Secondary | ICD-10-CM | POA: Diagnosis present

## 2016-10-15 DIAGNOSIS — I472 Ventricular tachycardia: Secondary | ICD-10-CM | POA: Diagnosis not present

## 2016-10-15 DIAGNOSIS — Z90710 Acquired absence of both cervix and uterus: Secondary | ICD-10-CM

## 2016-10-15 DIAGNOSIS — E784 Other hyperlipidemia: Secondary | ICD-10-CM | POA: Diagnosis not present

## 2016-10-15 DIAGNOSIS — D696 Thrombocytopenia, unspecified: Secondary | ICD-10-CM | POA: Diagnosis present

## 2016-10-15 DIAGNOSIS — E7800 Pure hypercholesterolemia, unspecified: Secondary | ICD-10-CM | POA: Diagnosis present

## 2016-10-15 DIAGNOSIS — Z96651 Presence of right artificial knee joint: Secondary | ICD-10-CM | POA: Diagnosis present

## 2016-10-15 DIAGNOSIS — E1122 Type 2 diabetes mellitus with diabetic chronic kidney disease: Secondary | ICD-10-CM | POA: Diagnosis present

## 2016-10-15 DIAGNOSIS — Z992 Dependence on renal dialysis: Secondary | ICD-10-CM

## 2016-10-15 HISTORY — DX: Polyneuropathy, unspecified: G62.9

## 2016-10-15 LAB — MRSA PCR SCREENING: MRSA by PCR: NEGATIVE

## 2016-10-15 LAB — CBC
HEMATOCRIT: 26.5 % — AB (ref 36.0–46.0)
HEMOGLOBIN: 8.5 g/dL — AB (ref 12.0–15.0)
MCH: 31 pg (ref 26.0–34.0)
MCHC: 32.1 g/dL (ref 30.0–36.0)
MCV: 96.7 fL (ref 78.0–100.0)
Platelets: 136 10*3/uL — ABNORMAL LOW (ref 150–400)
RBC: 2.74 MIL/uL — ABNORMAL LOW (ref 3.87–5.11)
RDW: 17.3 % — ABNORMAL HIGH (ref 11.5–15.5)
WBC: 7.1 10*3/uL (ref 4.0–10.5)

## 2016-10-15 LAB — HEMOGLOBIN A1C
Hgb A1c MFr Bld: 4.7 % — ABNORMAL LOW (ref 4.8–5.6)
Mean Plasma Glucose: 88.19 mg/dL

## 2016-10-15 LAB — GLUCOSE, CAPILLARY
GLUCOSE-CAPILLARY: 83 mg/dL (ref 65–99)
GLUCOSE-CAPILLARY: 191 mg/dL — AB (ref 65–99)

## 2016-10-15 LAB — BASIC METABOLIC PANEL
ANION GAP: 7 (ref 5–15)
BUN: 66 mg/dL — ABNORMAL HIGH (ref 6–20)
CO2: 18 mmol/L — AB (ref 22–32)
Calcium: 8.3 mg/dL — ABNORMAL LOW (ref 8.9–10.3)
Chloride: 114 mmol/L — ABNORMAL HIGH (ref 101–111)
Creatinine, Ser: 3.39 mg/dL — ABNORMAL HIGH (ref 0.44–1.00)
GFR calc Af Amer: 14 mL/min — ABNORMAL LOW (ref >60)
GFR calc non Af Amer: 12 mL/min — ABNORMAL LOW (ref >60)
GLUCOSE: 92 mg/dL (ref 65–99)
Potassium: 5.7 mmol/L — ABNORMAL HIGH (ref 3.5–5.1)
Sodium: 139 mmol/L (ref 135–145)

## 2016-10-15 LAB — TROPONIN I
TROPONIN I: 0.12 ng/mL — AB (ref <0.03)
Troponin I: 0.12 ng/mL (ref <0.03)

## 2016-10-15 LAB — MAGNESIUM: Magnesium: 2.3 mg/dL (ref 1.7–2.4)

## 2016-10-15 LAB — POTASSIUM: POTASSIUM: 5.4 mmol/L — AB (ref 3.5–5.1)

## 2016-10-15 LAB — BRAIN NATRIURETIC PEPTIDE: B NATRIURETIC PEPTIDE 5: 840 pg/mL — AB (ref 0.0–100.0)

## 2016-10-15 MED ORDER — BUDESONIDE 0.25 MG/2ML IN SUSP
0.2500 mg | Freq: Two times a day (BID) | RESPIRATORY_TRACT | Status: DC
  Administered 2016-10-15 – 2016-10-18 (×6): 0.25 mg via RESPIRATORY_TRACT
  Filled 2016-10-15 (×6): qty 2

## 2016-10-15 MED ORDER — ATORVASTATIN CALCIUM 40 MG PO TABS
40.0000 mg | ORAL_TABLET | Freq: Every day | ORAL | Status: DC
  Administered 2016-10-16 – 2016-10-17 (×2): 40 mg via ORAL
  Filled 2016-10-15 (×2): qty 1

## 2016-10-15 MED ORDER — DIPHENHYDRAMINE HCL 50 MG/ML IJ SOLN: 12.5000 mg | Freq: Once | INTRAMUSCULAR | Status: DC

## 2016-10-15 MED ORDER — INSULIN ASPART 100 UNIT/ML ~~LOC~~ SOLN
0.0000 [IU] | Freq: Three times a day (TID) | SUBCUTANEOUS | Status: DC
  Administered 2016-10-16 (×2): 3 [IU] via SUBCUTANEOUS
  Administered 2016-10-16 – 2016-10-17 (×2): 2 [IU] via SUBCUTANEOUS

## 2016-10-15 MED ORDER — INSULIN ASPART 100 UNIT/ML ~~LOC~~ SOLN
0.0000 [IU] | Freq: Every day | SUBCUTANEOUS | Status: DC
  Administered 2016-10-16: 2 [IU] via SUBCUTANEOUS

## 2016-10-15 MED ORDER — IPRATROPIUM-ALBUTEROL 0.5-2.5 (3) MG/3ML IN SOLN
3.0000 mL | Freq: Three times a day (TID) | RESPIRATORY_TRACT | Status: DC
  Administered 2016-10-15 – 2016-10-18 (×9): 3 mL via RESPIRATORY_TRACT
  Filled 2016-10-15 (×9): qty 3

## 2016-10-15 MED ORDER — ACETAMINOPHEN 650 MG RE SUPP
650.0000 mg | Freq: Four times a day (QID) | RECTAL | Status: DC | PRN
Start: 2016-10-15 — End: 2016-10-18

## 2016-10-15 MED ORDER — ONDANSETRON HCL 4 MG/2ML IJ SOLN: 4.0000 mg | Freq: Four times a day (QID) | INTRAMUSCULAR | Status: DC | PRN

## 2016-10-15 MED ORDER — METHYLPREDNISOLONE SODIUM SUCC 125 MG IJ SOLR
60.0000 mg | Freq: Four times a day (QID) | INTRAMUSCULAR | Status: DC
  Administered 2016-10-15 – 2016-10-16 (×4): 60 mg via INTRAVENOUS
  Filled 2016-10-15 (×4): qty 2

## 2016-10-15 MED ORDER — ONDANSETRON HCL 4 MG PO TABS: 4.0000 mg | ORAL_TABLET | Freq: Four times a day (QID) | ORAL | Status: DC | PRN

## 2016-10-15 MED ORDER — ACETAMINOPHEN 325 MG PO TABS
650.0000 mg | ORAL_TABLET | Freq: Four times a day (QID) | ORAL | Status: DC | PRN
  Administered 2016-10-16 (×2): 650 mg via ORAL
  Filled 2016-10-15 (×2): qty 2

## 2016-10-15 MED ORDER — APIXABAN 2.5 MG PO TABS
2.5000 mg | ORAL_TABLET | Freq: Two times a day (BID) | ORAL | Status: DC
  Administered 2016-10-15 – 2016-10-18 (×6): 2.5 mg via ORAL
  Filled 2016-10-15 (×11): qty 1

## 2016-10-15 MED ORDER — SODIUM POLYSTYRENE SULFONATE 15 GM/60ML PO SUSP
15.0000 g | Freq: Once | ORAL | Status: AC
  Administered 2016-10-15: 15 g via ORAL
  Filled 2016-10-15: qty 60

## 2016-10-15 MED ORDER — IPRATROPIUM-ALBUTEROL 0.5-2.5 (3) MG/3ML IN SOLN: 3.0000 mL | Freq: Four times a day (QID) | RESPIRATORY_TRACT | Status: DC

## 2016-10-15 MED ORDER — HYDRALAZINE HCL 25 MG PO TABS
50.0000 mg | ORAL_TABLET | Freq: Three times a day (TID) | ORAL | Status: DC
  Administered 2016-10-15 (×2): 50 mg via ORAL
  Filled 2016-10-15 (×2): qty 2

## 2016-10-15 MED ORDER — GABAPENTIN 300 MG PO CAPS
300.0000 mg | ORAL_CAPSULE | Freq: Every day | ORAL | Status: DC
  Administered 2016-10-16 – 2016-10-18 (×3): 300 mg via ORAL
  Filled 2016-10-15 (×3): qty 1

## 2016-10-15 MED ORDER — ASPIRIN EC 81 MG PO TBEC
81.0000 mg | DELAYED_RELEASE_TABLET | Freq: Every day | ORAL | Status: DC
  Administered 2016-10-16 – 2016-10-18 (×3): 81 mg via ORAL
  Filled 2016-10-15 (×3): qty 1

## 2016-10-15 NOTE — ED Notes (Signed)
EKG given to Dr. Knapp. 

## 2016-10-15 NOTE — H&P (Addendum)
History and Physical  Jody [name] XHB:[id number] DOB: 02-24-1940 DOA: 10/15/2016   PCP: Monico Blitz, MD   Patient coming from: Home  Chief Complaint: Home  HPI:  Jody [name] is a 76 y.o. female with medical history of diabetes mellitus, CKD stage IV, atrial fibrillation, diastolic CHF, COPD, hyperlipidemia, stroke presenting with one-day history of shortness of breath. The patient states that she was in her usual state of health until the morning of 10/14/2016 during which time she noted increasing shortness of breath with increasing nonproductive cough. In addition, the patient endorses some exertional chest discomfort and dyspnea on exertion for the better part of one month. She endorses compliance with her furosemide and diet. Her neighbor went to pick the patient up for church on the morning of 10/15/2016 when she noted the patient to have significant shortness of breath with exertion trying to get to the car. As result, the patient was brought to the emergency department for further evaluation. She denies any fevers, chills, nausea, vomiting, diarrhea, abdominal pain, dysuria, hematuria. She denies any orthopnea, PND, worsening lower extremity edema. However, she does feel that she has had some increase in abdominal girth over the past month. The patient saw her cardiologist, Dr. Bronson Ing, on 10/12/2016 during which time clonidine was added to the patient's antihypertensive regimen. She was continued on her usual dose of furosemide 40 mg twice a day.  In the emergency department, the patient was afebrile and hemodynamically stable saturating 98-99% on room air. However, the patient was noted to be bradycardic in the mid 30s. The patient was asymptomatic. CBC revealed her hemoglobin to be at baseline at 8.5. She was thrombocytopenic at 136,000. BMP showed potassium 5.7, and serum creatinine 3.39 which is above her usual baseline. The patient was given Kayexalate 15  g.  Assessment/Plan: COPD exacerbation -Start Pulmicort -Start IV Solu-Medrol -Start duo nebs -Pulmonary hygiene  Exertional dyspnea and chest discomfort/elevated troponin -Patient is pain-free at this time -question if dyspnea may also be related to her bradycardia -Plan to consult cardiology -EKG shows sinus rhythm--nonspecific T wave changes -12/10/2014 stress test--low risk -Elevated troponin likely demand ischemia  Sinus bradycardia/Atrial fibrillation with slow ventricular response -Heart rate in the mid 38s -May be related to the patient's newly introduced clonidine -Discontinue clonidine -Holding diltiazem -Monitor in the stepdown unit for the next 24 hours -Patient is clinically asymptomatic  Hyperkalemia -recheck potassium after kayexalate given in ED  Chronic diastolic CHF -The patient appears clinically euvolemic -I feel that the "interstitial edema" on chest x-ray likely represents chronic interstitial findings secondary to her COPD -Daily weights -Holding furosemide secondary to acute on chronic renal failure -Reassess for furosemide restart 10/16/2016  Acute on chronic renal failure--CKD stage IV -Renal ultrasound -Baseline creatinine 2.4-2.7  Essential hypertension -Continue hydralazine -Holding diltiazem and clonidine secondary to bradycardia  Diabetes mellitus with neuropathy and nephropathy -holding tresiba -novolog sliding scale -A1C  Hyperlipidemia -Continue statin  Anemia of CKD -baseline Hgb ~8-9 -follow hematology       Past Medical History:  Diagnosis Date  . Anemia in chronic kidney disease (CODE) 06/05/2015  . Arthritis    "bad in my legs" (12/07/2014)  . Atrial fibrillation ([place]) 06/05/2015  . Chronic back pain    "dr said my spine is crooked"  . Chronic bronchitis ([place])    "get it q yr" (12/07/2014)  . CKD stage 4 due to type 2 diabetes mellitus ([place]) 06/05/2015  . COPD (chronic obstructive pulmonary disease) ([place])   .  Gait difficulty 09/02/2014  . Heart disease   . Hypercholesterolemia   . Hypertension   . Stroke ([place]) 05/2014   denies residual on 12/07/2014  . Stroke with cerebral ischemia ([place])   . Type II diabetes mellitus ([place])    Past Surgical History:  Procedure Laterality Date  . ABDOMINAL HYSTERECTOMY  ~ 1970  . APPENDECTOMY  ~ 1970  . JOINT REPLACEMENT    . TOTAL KNEE ARTHROPLASTY Right ~ 1986  . TUMOR EXCISION  ~ 1970   "9# in my stomach"   Social History:  reports that she quit smoking about 12 years ago. Her smoking use included Cigarettes. She started smoking about 63 years ago. She has a 25.00 pack-year smoking history. She has never used smokeless tobacco. She reports that she does not drink alcohol or use drugs.   Family History  Problem Relation Age of Onset  . Cancer Other   . Heart attack Brother   . Cancer Sister        breast  . Alcohol abuse Brother      Allergies  Allergen Reactions  . Codeine Nausea And Vomiting     Prior to Admission medications   Medication Sig Start Date End Date Taking? Authorizing Provider  albuterol (PROVENTIL HFA;VENTOLIN HFA) 108 (90 BASE) MCG/ACT inhaler Inhale 1-2 puffs into the lungs every 6 (six) hours as needed for wheezing or shortness of breath. Reported on 03/01/2015   Yes [provider]  aspirin EC 81 MG tablet Take 81 mg by mouth daily.   Yes [provider]  atorvastatin (LIPITOR) 40 MG tablet Take 1 tablet by mouth daily. 01/22/16  Yes [provider]  B-D UF III MINI PEN NEEDLES 31G X 5 MM MISC  07/14/16  Yes [provider]  cloNIDine (CATAPRES) 0.1 MG tablet Take 1 tablet (0.1 mg total) by mouth 2 (two) times daily. 10/12/16  Yes Herminio Commons, MD  diltiazem (CARDIZEM CD) 180 MG 24 hr capsule Take 1 capsule (180 mg total) by mouth daily. 10/19/15  Yes Lendon Colonel, NP  ELIQUIS 2.5 MG TABS tablet Take 1 tablet (2.5 mg total) by mouth 2 (two) times daily. 10/19/15  Yes Lendon Colonel, NP  fluticasone-salmeterol (ADVAIR HFA) 230-21 MCG/ACT inhaler Inhale 2 puffs into the lungs 2 (two) times daily.   Yes [provider]  furosemide (LASIX) 40 MG tablet Take 1 tablet (40 mg total) by mouth 2 (two) times daily. 10/19/15  Yes Lendon Colonel, NP  gabapentin (NEURONTIN) 300 MG capsule Take 1 capsule by mouth daily. 01/22/16  Yes [provider]  hydrALAZINE (APRESOLINE) 100 MG tablet Take 1 tablet (100 mg total) by mouth 3 (three) times daily. 07/12/16  Yes Herminio Commons, MD  Insulin Degludec (TRESIBA FLEXTOUCH) 100 UNIT/ML SOPN Inject 14 Units into the skin daily.    Yes [provider]  ONE TOUCH ULTRA TEST test strip  08/29/16  Yes [provider]  potassium chloride SA (K-DUR,KLOR-CON) 20 MEQ tablet Take 1 tablet (20 mEq total) by mouth daily. 10/19/15  Yes Lendon Colonel, NP    Review of Systems:  Constitutional:  No weight loss, night sweats, Fevers, chills, fatigue.  Head&Eyes: No headache.  No vision loss.  No eye pain or scotoma ENT:  No Difficulty swallowing,Tooth/dental problems,Sore throat,  No ear ache, post nasal drip,  Cardio-vascular:  No Orthopnea, PND, swelling in lower extremities,  dizziness, palpitations  GI:  No  abdominal pain, nausea, vomiting,  diarrhea, loss of appetite, hematochezia, melena, heartburn, indigestion, Resp:   No coughing up of blood .No wheezing.No chest wall deformity  Skin:  no rash or lesions.  GU:  no dysuria, change in color of urine, no urgency or frequency. No flank pain.  Musculoskeletal:  No joint pain or swelling. No decreased range of motion. No back pain.  Psych:  No change in mood or affect. No depression or anxiety. Neurologic: No headache, no dysesthesia, no focal weakness, no vision loss. No syncope  Physical Exam: Vitals:   10/15/16 1130 10/15/16 1230 10/15/16 1330 10/15/16 1400  BP: (!) 149/59 (!) 151/64 (!) 149/56 (!) 154/77  Pulse: (!) 38 (!) 37  (!)  37  Resp:      Temp:      TempSrc:      SpO2: 100% 99% 93% 99%  Weight:      Height:       General:  A&O x 3, NAD, nontoxic, pleasant/cooperative Head/Eye: No conjunctival hemorrhage, no icterus, /AT, No nystagmus ENT:  No icterus,  No thrush, good dentition, no pharyngeal exudate Neck:  No masses, no lymphadenpathy, no bruits CV:  RRR, no rub, no gallop, no S3 Lung:  Bibasilar crackles with bilateral expiratory wheeze. Abdomen: soft/NT, +BS, nondistended, no peritoneal signs Ext: No cyanosis, No rashes, No petechiae, No lymphangitis, trace edema R>L Neuro: CNII-XII intact, strength 4/5 in bilateral upper and lower extremities, no dysmetria  Labs on Admission:  Basic Metabolic Panel:  Recent Labs Lab 10/15/16 1105  NA 139  K 5.7*  CL 114*  CO2 18*  GLUCOSE 92  BUN 66*  CREATININE 3.39*  CALCIUM 8.3*  MG 2.3   Liver Function Tests: No results for input(s): AST, ALT, ALKPHOS, BILITOT, PROT, ALBUMIN in the last 168 hours. No results for input(s): LIPASE, AMYLASE in the last 168 hours. No results for input(s): AMMONIA in the last 168 hours. CBC:  Recent Labs Lab 10/13/16 1044 10/15/16 1105  WBC 6.9 7.1  NEUTROABS 5.3  --   HGB 8.5* 8.5*  HCT 25.8* 26.5*  MCV 94.9 96.7  PLT 125* 136*   Coagulation Profile: No results for input(s): INR, PROTIME in the last 168 hours. Cardiac Enzymes:  Recent Labs Lab 10/15/16 1105  TROPONINI 0.12*   BNP: Invalid input(s): POCBNP CBG: No results for input(s): GLUCAP in the last 168 hours. Urine analysis:    Component Value Date/Time   COLORURINE YELLOW 05/27/2014 1902   APPEARANCEUR CLEAR 05/27/2014 1902   LABSPEC 1.012 05/27/2014 1902   PHURINE 5.0 05/27/2014 1902   GLUCOSEU NEGATIVE 05/27/2014 1902   HGBUR NEGATIVE 05/27/2014 1902   BILIRUBINUR NEGATIVE 05/27/2014 1902   KETONESUR NEGATIVE 05/27/2014 1902   PROTEINUR 100 (A) 05/27/2014 1902   UROBILINOGEN 0.2 05/27/2014 1902   NITRITE NEGATIVE 05/27/2014 1902    LEUKOCYTESUR NEGATIVE 05/27/2014 1902   Sepsis Labs: @LABRCNTIP (procalcitonin:4,lacticidven:4) )No results found for this or any previous visit (from the past 240 hour(s)).   Radiological Exams on Admission: Dg Chest 2 View  Result Date: 10/15/2016 CLINICAL DATA:  Pt c/o SOB and Rt side CP since yesterday. Pt states symptoms worsened this AM. Hx diabetes, stroke, HTN, COPD, CKD, A-fib, former smoker EXAM: CHEST - 2 VIEW COMPARISON:  10/03/2015 FINDINGS: Persistent central pulmonary vascular congestion with slight improvement in the interstitial edema or infiltrates seen previously. No new airspace disease. Stable cardiomegaly.  Atheromatous tortuous aorta. No effusion.  No pneumothorax. Anterior vertebral endplate spurring at multiple levels in the mid and lower thoracic  spine. IMPRESSION: 1. Cardiomegaly with slight improvement in interstitial edema, persistent pulmonary vascular congestion Electronically Signed   By: Lucrezia Europe M.D.   On: 10/15/2016 12:23    EKG: Independently reviewed. Sinus bradycardia with nonspecific T-wave change    Time spent:60 minutes Code Status:   FULL Family Communication:  No Family at bedside Disposition Plan: expect 2-3 day hospitalization Consults called: none DVT Prophylaxis: apixaban  Lane Kjos, DO  [facility] Pager [phone]  If 7PM-7AM, please contact night-coverage [email or web address] Password [email or web address] 10/15/2016, 2:22 PM

## 2016-10-15 NOTE — ED Notes (Signed)
Admitting MD at bedside.

## 2016-10-15 NOTE — ED Notes (Signed)
EDP made aware of patients heart rate, states if patient is not symptomatic no new orders given. Patient denies any complaints at this time.

## 2016-10-15 NOTE — ED Notes (Signed)
Dr. Tomi Bamberger notified of pt HR 38-45.

## 2016-10-15 NOTE — ED Provider Notes (Signed)
[facility] DEPT Provider Note   CSN: [id number] Arrival date & time: 10/15/16  1023     History   Chief Complaint Chief Complaint  Patient presents with  . Shortness of Breath    HPI Jody [name] is a 76 y.o. female.  HPI Patient presents to the emergency room for evaluation of shortness of breath.  She has a history of COPD, chronic kidney disease and congestive heart failure.Patient noted she was having some increasing difficulty with her breathing starting yesterday. She also had some mild chest discomfort. Today, the patient was getting ready to go to church.  As she was walking there she began feeling more short of breath. She felt like she was wheezing.She decided to come to the emergency room. Patient denies any fevers. She denies any coughing. She has noticed some leg swelling. Past Medical History:  Diagnosis Date  . Anemia in chronic kidney disease (CODE) 06/05/2015  . Arthritis    "bad in my legs" (12/07/2014)  . Atrial fibrillation ([place]) 06/05/2015  . Chronic back pain    "dr said my spine is crooked"  . Chronic bronchitis ([place])    "get it q yr" (12/07/2014)  . CKD stage 4 due to type 2 diabetes mellitus ([place]) 06/05/2015  . COPD (chronic obstructive pulmonary disease) ([place])   . Gait difficulty 09/02/2014  . Heart disease   . Hypercholesterolemia   . Hypertension   . Stroke ([place]) 05/2014   denies residual on 12/07/2014  . Stroke with cerebral ischemia ([place])   . Type II diabetes mellitus [facility])     Patient Active Problem List   Diagnosis Date Noted  . NSTEMI (non-ST elevated myocardial infarction) ([place]) 10/03/2015  . Elevated LFTs 10/03/2015  . Encounter for therapeutic drug monitoring 07/29/2015  . COPD exacerbation ([place])   . COPD (chronic obstructive pulmonary disease) ([place]) 06/05/2015  . CKD stage 4 due to type 2 diabetes mellitus ([place]) 06/05/2015  . Anemia in chronic kidney disease, Stage IV 06/05/2015  . Acute on chronic diastolic CHF (congestive  heart failure) ([place]) 06/05/2015  . Atrial fibrillation ([place]) 06/05/2015  . CHF exacerbation ([place]) 06/05/2015  . Atrial fibrillation with RVR ([place]) 12/08/2014  . Atrial fibrillation with rapid ventricular response ([place]) 12/07/2014  . TIA (transient ischemic attack) 09/02/2014  . Gait difficulty 09/02/2014  . Other specified transient cerebral ischemias   . Stroke with cerebral ischemia ([place])   . Hemispheric carotid artery syndrome   . Essential hypertension   . Hyperlipidemia   . Type 2 diabetes mellitus with other circulatory complications ([place])   . CVA (cerebral infarction) 05/27/2014    Past Surgical History:  Procedure Laterality Date  . ABDOMINAL HYSTERECTOMY  ~ 1970  . APPENDECTOMY  ~ 1970  . JOINT REPLACEMENT    . TOTAL KNEE ARTHROPLASTY Right ~ 1986  . TUMOR EXCISION  ~ 1970   "9# in my stomach"    OB History    No data available       Home Medications    Prior to Admission medications   Medication Sig Start Date End Date Taking? Authorizing Provider  albuterol (PROVENTIL HFA;VENTOLIN HFA) 108 (90 BASE) MCG/ACT inhaler Inhale 1-2 puffs into the lungs every 6 (six) hours as needed for wheezing or shortness of breath. Reported on 03/01/2015   Yes [provider]  aspirin EC 81 MG tablet Take 81 mg by mouth daily.   Yes [provider]  atorvastatin (LIPITOR) 40 MG tablet Take 1 tablet by mouth daily.  01/22/16  Yes [provider]  B-D UF III MINI PEN NEEDLES 31G X 5 MM MISC  07/14/16  Yes [provider]  cloNIDine (CATAPRES) 0.1 MG tablet Take 1 tablet (0.1 mg total) by mouth 2 (two) times daily. 10/12/16  Yes Herminio Commons, MD  diltiazem (CARDIZEM CD) 180 MG 24 hr capsule Take 1 capsule (180 mg total) by mouth daily. 10/19/15  Yes Lendon Colonel, NP  ELIQUIS 2.5 MG TABS tablet Take 1 tablet (2.5 mg total) by mouth 2 (two) times daily. 10/19/15  Yes Lendon Colonel, NP  fluticasone-salmeterol (ADVAIR HFA) 230-21 MCG/ACT  inhaler Inhale 2 puffs into the lungs 2 (two) times daily.   Yes [provider]  furosemide (LASIX) 40 MG tablet Take 1 tablet (40 mg total) by mouth 2 (two) times daily. 10/19/15  Yes Lendon Colonel, NP  gabapentin (NEURONTIN) 300 MG capsule Take 1 capsule by mouth daily. 01/22/16  Yes [provider]  hydrALAZINE (APRESOLINE) 100 MG tablet Take 1 tablet (100 mg total) by mouth 3 (three) times daily. 07/12/16  Yes Herminio Commons, MD  Insulin Degludec (TRESIBA FLEXTOUCH) 100 UNIT/ML SOPN Inject 14 Units into the skin daily.    Yes [provider]  ONE TOUCH ULTRA TEST test strip  08/29/16  Yes [provider]  potassium chloride SA (K-DUR,KLOR-CON) 20 MEQ tablet Take 1 tablet (20 mEq total) by mouth daily. 10/19/15  Yes Lendon Colonel, NP    Family History Family History  Problem Relation Age of Onset  . Cancer Other   . Heart attack Brother   . Cancer Sister        breast  . Alcohol abuse Brother     Social History Social History  Substance Use Topics  . Smoking status: Former Smoker    Packs/day: 0.50    Years: 50.00    Types: Cigarettes    Start date: 03/07/1953    Quit date: 01/14/2004  . Smokeless tobacco: Never Used     Comment: "quit smoking cigarettes  in ~ 2005"  . Alcohol use No     Comment: 10/242016 "quit drinking beer in ~ 1977"     Allergies   Codeine   Review of Systems Review of Systems  Constitutional: Negative for fever.  Respiratory: Positive for shortness of breath. Negative for cough.   Neurological: Positive for headaches.  All other systems reviewed and are negative.    Physical Exam Updated Vital Signs BP (!) 144/66   Pulse (!) 40   Temp 98.1 F (36.7 C) (Oral)   Resp 15   Ht 1.702 m (5\' 7" )   Wt 97.5 kg (215 lb)   SpO2 95%   BMI 33.67 kg/m   Physical Exam  Constitutional: She appears well-developed and well-nourished. No distress.  HENT:  Head: Normocephalic and atraumatic.  Right  Ear: External ear normal.  Left Ear: External ear normal.  Eyes: Conjunctivae are normal. Right eye exhibits no discharge. Left eye exhibits no discharge. No scleral icterus.  Neck: Neck supple. No tracheal deviation present.  Cardiovascular: Regular rhythm and intact distal pulses.  Bradycardia present.   Pulmonary/Chest: Effort normal and breath sounds normal. No stridor. No respiratory distress. She has no wheezes. She has no rales ( primarily on the left side).  Abdominal: Soft. Bowel sounds are normal. She exhibits no distension. There is no tenderness. There is no rebound and no guarding.  Musculoskeletal: She exhibits edema ( mild pitting edema bilaterally). She  exhibits no tenderness.  Neurological: She is alert. She has normal strength. No cranial nerve deficit (no facial droop, extraocular movements intact, no slurred speech) or sensory deficit. She exhibits normal muscle tone. She displays no seizure activity. Coordination normal.  Skin: Skin is warm and dry. No rash noted.  Psychiatric: She has a normal mood and affect.  Nursing note and vitals reviewed.    ED Treatments / Results  Labs (all labs ordered are listed, but only abnormal results are displayed) Labs Reviewed  BASIC METABOLIC PANEL - Abnormal; Notable for the following:       Result Value   Potassium 5.7 (*)    Chloride 114 (*)    CO2 18 (*)    BUN 66 (*)    Creatinine, Ser 3.39 (*)    Calcium 8.3 (*)    GFR calc non Af Amer 12 (*)    GFR calc Af Amer 14 (*)    All other components within normal limits  CBC - Abnormal; Notable for the following:    RBC 2.74 (*)    Hemoglobin 8.5 (*)    HCT 26.5 (*)    RDW 17.3 (*)    Platelets 136 (*)    All other components within normal limits  BRAIN NATRIURETIC PEPTIDE - Abnormal; Notable for the following:    B Natriuretic Peptide 840.0 (*)    All other components within normal limits  TROPONIN I - Abnormal; Notable for the following:    Troponin I 0.12 (*)    All  other components within normal limits  MAGNESIUM    EKG  EKG Interpretation  Date/Time:  Sunday October 15 2016 10:33:48 EDT Ventricular Rate:  44 PR Interval:    QRS Duration: 98 QT Interval:  482 QTC Calculation: 413 R Axis:   27 Text Interpretation:  Sinus or ectopic atrial bradycardia Nonspecific T abnormalities, lateral leads Since last tracing rate slower Confirmed by Dorie Rank [phone]) on 10/15/2016 10:40:19 AM       Radiology Dg Chest 2 View  Result Date: 10/15/2016 CLINICAL DATA:  Pt c/o SOB and Rt side CP since yesterday. Pt states symptoms worsened this AM. Hx diabetes, stroke, HTN, COPD, CKD, A-fib, former smoker EXAM: CHEST - 2 VIEW COMPARISON:  10/03/2015 FINDINGS: Persistent central pulmonary vascular congestion with slight improvement in the interstitial edema or infiltrates seen previously. No new airspace disease. Stable cardiomegaly.  Atheromatous tortuous aorta. No effusion.  No pneumothorax. Anterior vertebral endplate spurring at multiple levels in the mid and lower thoracic spine. IMPRESSION: 1. Cardiomegaly with slight improvement in interstitial edema, persistent pulmonary vascular congestion Electronically Signed   By: Lucrezia Europe M.D.   On: 10/15/2016 12:23    Procedures Procedures (including critical care time)  Medications Ordered in ED Medications  sodium polystyrene (KAYEXALATE) 15 GM/60ML suspension 15 g (not administered)     Initial Impression / Assessment and Plan / ED Course  I have reviewed the triage vital signs and the nursing notes.  Pertinent labs & imaging results that were available during my care of the patient were reviewed by me and considered in my medical decision making (see chart for details).   she presents to the emergency room for evaluation of shortness of breath , worse with exertion.  Patient denies any chest pain in the ED and her shortness of breath has improved however she is notably bradycardic. Her blood pressure is  stable.  She did not require any active intervention for her bradycardia.  Patient's  laboratory tests are also notable for a slightly elevated troponin and worsening renal function.  Suspect heart strain over acute cardiac ischemia but we will need to monitor. Possible her medications could be contributing on top of her chronic kidney disease. Plan on admission to the hospital for further treatment.   Final Clinical Impressions(s) / ED Diagnoses   Final diagnoses:  Bradycardia  Acute renal failure superimposed on chronic kidney disease, unspecified CKD stage, unspecified acute renal failure type ([place])  Elevated troponin      Dorie Rank, MD 10/15/16 1334

## 2016-10-15 NOTE — ED Triage Notes (Signed)
Sob and chest pain since yesterday.

## 2016-10-15 NOTE — ED Notes (Signed)
CRITICAL VALUE ALERT  Critical Value:  Troponin 0.12  Date & Time Notied:  10/15/16  1149  Provider Notified: Dr. Tomi Bamberger  Orders Received/Actions taken: MD made aware.

## 2016-10-16 ENCOUNTER — Other Ambulatory Visit: Payer: Self-pay | Admitting: Adult Health

## 2016-10-16 ENCOUNTER — Inpatient Hospital Stay (HOSPITAL_COMMUNITY): Payer: Medicare Other

## 2016-10-16 DIAGNOSIS — R001 Bradycardia, unspecified: Secondary | ICD-10-CM

## 2016-10-16 LAB — ECHOCARDIOGRAM COMPLETE
AO mean calculated velocity dopler: 140 cm/s
AOVTI: 48.6 cm
AV Area VTI: 1.87 cm2
AV Area mean vel: 1.86 cm2
AV Peak grad: 22 mmHg
AV VEL mean LVOT/AV: 0.66
AV area mean vel ind: 0.85 cm2/m2
AVA: 1.92 cm2
AVAREAVTIIND: 0.87 cm2/m2
AVG: 10 mmHg
AVPKVEL: 235 cm/s
Ao pk vel: 0.66 m/s
CHL CUP AV PEAK INDEX: 0.85
CHL CUP AV VEL: 1.92
EERAT: 9.2
EWDT: 201 ms
FS: 36 % (ref 28–44)
HEIGHTINCHES: 67 in
IVS/LV PW RATIO, ED: 0.8
LA diam end sys: 39 mm
LA diam index: 1.77 cm/m2
LA vol index: 47.6 mL/m2
LA vol: 105 mL
LASIZE: 39 mm
LAVOLA4C: 97.4 mL
LV E/e'average: 9.2
LV TDI E'LATERAL: 11.2
LV sys vol index: 21 mL/m2
LV sys vol: 47 mL — AB (ref 14–42)
LVDIAVOL: 113 mL — AB (ref 46–106)
LVDIAVOLIN: 51 mL/m2
LVEEMED: 9.2
LVELAT: 11.2 cm/s
LVOT SV: 93 mL
LVOT VTI: 32.8 cm
LVOT area: 2.84 cm2
LVOT diameter: 19 mm
LVOT peak VTI: 0.67 cm
LVOT peak grad rest: 10 mmHg
LVOT peak vel: 155 cm/s
MV Dec: 201
MV pk A vel: 85.4 m/s
MV pk E vel: 103 m/s
MVPG: 4 mmHg
PW: 12.8 mm — AB (ref 0.6–1.1)
RV LATERAL S' VELOCITY: 15 cm/s
RV TAPSE: 22 mm
Simpson's disk: 59
Stroke v: 66 ml
TDI e' medial: 5.77
Valve area index: 0.87
WEIGHTICAEL: 3506.2 [oz_av]

## 2016-10-16 LAB — BASIC METABOLIC PANEL
ANION GAP: 10 (ref 5–15)
Anion gap: 11 (ref 5–15)
BUN: 68 mg/dL — ABNORMAL HIGH (ref 6–20)
BUN: 69 mg/dL — ABNORMAL HIGH (ref 6–20)
CALCIUM: 8.3 mg/dL — AB (ref 8.9–10.3)
CHLORIDE: 112 mmol/L — AB (ref 101–111)
CHLORIDE: 110 mmol/L (ref 101–111)
CO2: 17 mmol/L — AB (ref 22–32)
CO2: 17 mmol/L — AB (ref 22–32)
Calcium: 8.5 mg/dL — ABNORMAL LOW (ref 8.9–10.3)
Creatinine, Ser: 3.36 mg/dL — ABNORMAL HIGH (ref 0.44–1.00)
Creatinine, Ser: 3.17 mg/dL — ABNORMAL HIGH (ref 0.44–1.00)
GFR calc Af Amer: 14 mL/min — ABNORMAL LOW (ref >60)
GFR calc Af Amer: 15 mL/min — ABNORMAL LOW (ref >60)
GFR calc non Af Amer: 12 mL/min — ABNORMAL LOW (ref >60)
GFR calc non Af Amer: 13 mL/min — ABNORMAL LOW (ref >60)
GLUCOSE: 207 mg/dL — AB (ref 65–99)
GLUCOSE: 248 mg/dL — AB (ref 65–99)
POTASSIUM: 5.4 mmol/L — AB (ref 3.5–5.1)
POTASSIUM: 4.7 mmol/L (ref 3.5–5.1)
Sodium: 139 mmol/L (ref 135–145)
Sodium: 138 mmol/L (ref 135–145)

## 2016-10-16 LAB — TROPONIN I: Troponin I: 0.14 ng/mL (ref <0.03)

## 2016-10-16 LAB — GLUCOSE, CAPILLARY
GLUCOSE-CAPILLARY: 182 mg/dL — AB (ref 65–99)
GLUCOSE-CAPILLARY: 237 mg/dL — AB (ref 65–99)
Glucose-Capillary: 508 mg/dL (ref 65–99)
Glucose-Capillary: 215 mg/dL — ABNORMAL HIGH (ref 65–99)

## 2016-10-16 LAB — CBC
HEMATOCRIT: 28.6 % — AB (ref 36.0–46.0)
HEMOGLOBIN: 9.1 g/dL — AB (ref 12.0–15.0)
MCH: 30.7 pg (ref 26.0–34.0)
MCHC: 31.8 g/dL (ref 30.0–36.0)
MCV: 96.6 fL (ref 78.0–100.0)
Platelets: 147 10*3/uL — ABNORMAL LOW (ref 150–400)
RBC: 2.96 MIL/uL — ABNORMAL LOW (ref 3.87–5.11)
RDW: 16.8 % — ABNORMAL HIGH (ref 11.5–15.5)
WBC: 7.4 10*3/uL (ref 4.0–10.5)

## 2016-10-16 MED ORDER — SODIUM BICARBONATE 650 MG PO TABS
650.0000 mg | ORAL_TABLET | Freq: Two times a day (BID) | ORAL | Status: DC
  Administered 2016-10-16 – 2016-10-18 (×5): 650 mg via ORAL
  Filled 2016-10-16 (×5): qty 1

## 2016-10-16 MED ORDER — INSULIN GLARGINE 100 UNIT/ML ~~LOC~~ SOLN
5.0000 [IU] | Freq: Every day | SUBCUTANEOUS | Status: DC
  Administered 2016-10-16 – 2016-10-18 (×3): 5 [IU] via SUBCUTANEOUS
  Filled 2016-10-16 (×4): qty 0.05

## 2016-10-16 MED ORDER — PREDNISONE 20 MG PO TABS
60.0000 mg | ORAL_TABLET | Freq: Every day | ORAL | Status: DC
  Administered 2016-10-17: 60 mg via ORAL
  Filled 2016-10-16: qty 3

## 2016-10-16 MED ORDER — HYDRALAZINE HCL 20 MG/ML IJ SOLN
10.0000 mg | INTRAMUSCULAR | Status: DC | PRN
  Administered 2016-10-16: 10 mg via INTRAVENOUS
  Filled 2016-10-16: qty 1

## 2016-10-16 MED ORDER — AMLODIPINE BESYLATE 5 MG PO TABS
5.0000 mg | ORAL_TABLET | Freq: Every day | ORAL | Status: DC
  Administered 2016-10-16: 5 mg via ORAL
  Filled 2016-10-16: qty 1

## 2016-10-16 MED ORDER — HYDRALAZINE HCL 20 MG/ML IJ SOLN
10.0000 mg | Freq: Four times a day (QID) | INTRAMUSCULAR | Status: DC | PRN
  Administered 2016-10-16: 10 mg via INTRAVENOUS
  Filled 2016-10-16: qty 1

## 2016-10-16 MED ORDER — HYDRALAZINE HCL 25 MG PO TABS
100.0000 mg | ORAL_TABLET | Freq: Three times a day (TID) | ORAL | Status: DC
  Administered 2016-10-16 – 2016-10-18 (×7): 100 mg via ORAL
  Filled 2016-10-16 (×7): qty 4

## 2016-10-16 NOTE — Progress Notes (Signed)
*  PRELIMINARY RESULTS* Echocardiogram 2D Echocardiogram has been performed.  Jody [name] 10/16/2016, 11:37 AM

## 2016-10-16 NOTE — Progress Notes (Signed)
CRITICAL VALUE ALERT  Critical Value:  Trop 0.14  Date & Time Notied:  10/16/2016 0120  Provider Notified: MD Olevia Bowens

## 2016-10-16 NOTE — Progress Notes (Signed)
Pt's BP elevated. SBP 190's. Paged MD. Received PRN hydralazine

## 2016-10-16 NOTE — Plan of Care (Signed)
Problem: Safety: Goal: Ability to remain free from injury will improve Outcome: Completed/Met Date Met: 10/16/16 Patient A&O, aware of her own limitations, asks for help when needed, bed in lowest position, call bell within reach, skid proof socks on

## 2016-10-16 NOTE — Progress Notes (Signed)
0800 Patient noted on O2 [place]@2L  which is acute for patient. O2 [place] removed and patient maintaining O2 SAT[place] at 98% RA. No c/o of SOB or respiratory distress at this time.

## 2016-10-16 NOTE — Progress Notes (Addendum)
PROGRESS NOTE  Jody [name] Neuroth WOE:[id number] DOB: 1940/03/20 DOA: 10/15/2016 PCP: Monico Blitz, MD  Brief History:   76 y.o. female with medical history of diabetes mellitus, CKD stage IV, atrial fibrillation, diastolic CHF, COPD, hyperlipidemia, stroke presenting with one-day history of shortness of breath. The patient states that she was in her usual state of health until the morning of 10/14/2016 during which time she noted increasing shortness of breath with increasing nonproductive cough. In addition, the patient endorses some exertional chest discomfort and dyspnea on exertion for the better part of one month. She endorses compliance with her furosemide and diet. Her neighbor went to pick the patient up for church on the morning of 10/15/2016 when she noted the patient to have significant shortness of breath with exertion trying to get to the car. As result, the patient was brought to the emergency department for further evaluation. The patient saw her cardiologist, Dr. Bronson Ing, on 10/12/2016 during which time clonidine was added to the patient's antihypertensive regimen. She was continued on her usual dose of furosemide 40 mg twice a day.  Assessment/Plan: COPD exacerbation -Continue Pulmicort -IV Solu-Medrol-->po prednisone -Continue duo nebs -Pulmonary hygiene  Exertional dyspnea and chest discomfort/elevated troponin -Patient is pain-free at this time -question if dyspnea may also be related to her bradycardia -Plan to consult cardiology -EKG shows sinus rhythm--nonspecific T wave changes -12/10/2014 stress test--low risk -Elevated troponin likely demand ischemia--trend is flat  Sinus bradycardia/Atrial fibrillation with slow ventricular response -Heart rate in the mid 30s-->improved off clonidine and diltiazem -May be related to the patient's newly introduced clonidine -Discontinue clonidine -Holding diltiazem -Patient is clinically  asymptomatic  Hyperkalemia -repeat BMP -due to acute on on chronic renal failure and RTA type 4  Chronic diastolic CHF -The patient appears clinically euvolemic -I feel that the "interstitial edema" on chest x-ray likely represents chronic interstitial findings secondary to her COPD -Daily weights -Holding furosemide secondary to acute on chronic renal failure -Reassess for furosemide restart 10/17/2016  Acute on chronic renal failure--CKD stage IV -Renal ultrasound -Baseline creatinine 2.4-2.7 -start po bicarbonate  Essential hypertension -increase hydralazine -Holding diltiazem and clonidine secondary to bradycardia -start amlodipine  Diabetes mellitus with neuropathy and nephropathy -holding tresiba -start lantus 5 units -novolog sliding scale -A1C--4.7  Hyperlipidemia -Continue statin  Anemia of CKD -baseline Hgb ~8-9 -follow hematology   Disposition Plan:   Home 10/17/16 if stable Family Communication:   Family at bedside  Consultants:  none  Code Status:  FULL   DVT Prophylaxis:  apixaban   Procedures: As Listed in Progress Note Above  Antibiotics: None    Subjective: Patient states that she is breathing 50% better. She is still having some dyspnea on exertion. She denies any fevers, chills, chest discomfort, nausea, vomiting, diarrhea, abdominal pain. She still has a nonproductive cough.  Objective: Vitals:   10/16/16 0815 10/16/16 0900 10/16/16 1000 10/16/16 1039  BP: (!) 191/88 (!) 164/73 (!) 195/87 (!) 195/87  Pulse:  73 76   Resp:  14 13   Temp:      TempSrc:      SpO2:  98% 99%   Weight:      Height:        Intake/Output Summary (Last 24 hours) at 10/16/16 1210 Last data filed at 10/16/16 0730  Gross per 24 hour  Intake              480 ml  Output  200 ml  Net              280 ml   Weight change:  Exam:   General:  Pt is alert, follows commands appropriately, not in acute distress  HEENT: No icterus, No  thrush, No neck mass, [place]/AT  Cardiovascular: RRR, S1/S2, no rubs, no gallops  Respiratory: Bibasilar rales without wheezing. Good air movement.  Abdomen: Soft/+BS, non tender, non distended, no guarding  Extremities: trace LE edema, No lymphangitis, No petechiae, No rashes, no synovitis   Data Reviewed: I have personally reviewed following labs and imaging studies Basic Metabolic Panel:  Recent Labs Lab 10/15/16 1105 10/15/16 1642 10/16/16 0447  NA 139  --  139  K 5.7* 5.4* 5.4*  CL 114*  --  112*  CO2 18*  --  17*  GLUCOSE 92  --  207*  BUN 66*  --  68*  CREATININE 3.39*  --  3.36*  CALCIUM 8.3*  --  8.3*  MG 2.3  --   --    Liver Function Tests: No results for input(s): AST, ALT, ALKPHOS, BILITOT, PROT, ALBUMIN in the last 168 hours. No results for input(s): LIPASE, AMYLASE in the last 168 hours. No results for input(s): AMMONIA in the last 168 hours. Coagulation Profile: No results for input(s): INR, PROTIME in the last 168 hours. CBC:  Recent Labs Lab 10/13/16 1044 10/15/16 1105 10/16/16 0447  WBC 6.9 7.1 7.4  NEUTROABS 5.3  --   --   HGB 8.5* 8.5* 9.1*  HCT 25.8* 26.5* 28.6*  MCV 94.9 96.7 96.6  PLT 125* 136* 147*   Cardiac Enzymes:  Recent Labs Lab 10/15/16 1105 10/15/16 1642 10/15/16 2258  TROPONINI 0.12* 0.12* 0.14*   BNP: Invalid input(s): POCBNP CBG:  Recent Labs Lab 10/15/16 1628 10/15/16 2115 10/16/16 0759 10/16/16 1139  GLUCAP 83 191* 182* 508*   HbA1C:  Recent Labs  10/15/16 1642  HGBA1C 4.7*   Urine analysis:    Component Value Date/Time   COLORURINE YELLOW 05/27/2014 1902   APPEARANCEUR CLEAR 05/27/2014 1902   LABSPEC 1.012 05/27/2014 1902   PHURINE 5.0 05/27/2014 1902   GLUCOSEU NEGATIVE 05/27/2014 1902   HGBUR NEGATIVE 05/27/2014 1902   BILIRUBINUR NEGATIVE 05/27/2014 1902   KETONESUR NEGATIVE 05/27/2014 1902   PROTEINUR 100 (A) 05/27/2014 1902   UROBILINOGEN 0.2 05/27/2014 1902   NITRITE NEGATIVE 05/27/2014  1902   LEUKOCYTESUR NEGATIVE 05/27/2014 1902   Sepsis Labs: @LABRCNTIP (procalcitonin:4,lacticidven:4) ) Recent Results (from the past 240 hour(s))  MRSA PCR Screening     Status: None   Collection Time: 10/15/16  3:58 PM  Result Value Ref Range Status   MRSA by PCR NEGATIVE NEGATIVE Final    Comment:        The GeneXpert MRSA Assay (FDA approved for NASAL specimens only), is one component of a comprehensive MRSA colonization surveillance program. It is not intended to diagnose MRSA infection nor to guide or monitor treatment for MRSA infections.      Scheduled Meds: . amLODipine  5 mg Oral Daily  . apixaban  2.5 mg Oral BID  . aspirin EC  81 mg Oral Daily  . atorvastatin  40 mg Oral q1800  . budesonide (PULMICORT) nebulizer solution  0.25 mg Nebulization BID  . gabapentin  300 mg Oral Daily  . hydrALAZINE  100 mg Oral TID  . insulin aspart  0-5 Units Subcutaneous QHS  . insulin aspart  0-9 Units Subcutaneous TID WC  . ipratropium-albuterol  3  mL Nebulization TID  . [START ON 10/17/2016] predniSONE  60 mg Oral Q breakfast  . sodium bicarbonate  650 mg Oral BID   Continuous Infusions:  Procedures/Studies: Dg Chest 2 View  Result Date: 10/15/2016 CLINICAL DATA:  Pt c/o SOB and Rt side CP since yesterday. Pt states symptoms worsened this AM. Hx diabetes, stroke, HTN, COPD, CKD, A-fib, former smoker EXAM: CHEST - 2 VIEW COMPARISON:  10/03/2015 FINDINGS: Persistent central pulmonary vascular congestion with slight improvement in the interstitial edema or infiltrates seen previously. No new airspace disease. Stable cardiomegaly.  Atheromatous tortuous aorta. No effusion.  No pneumothorax. Anterior vertebral endplate spurring at multiple levels in the mid and lower thoracic spine. IMPRESSION: 1. Cardiomegaly with slight improvement in interstitial edema, persistent pulmonary vascular congestion Electronically Signed   By: Lucrezia Europe M.D.   On: 10/15/2016 12:23    Chanay Nugent,  DO  [facility] Pager [phone]  If 7PM-7AM, please contact night-coverage [email or web address] Password [email or web address] 10/16/2016, 12:10 PM   LOS: 1 day

## 2016-10-16 NOTE — Progress Notes (Signed)
Patient transferring to Dept 300 room# 311 telemetry. Report given to receiving nurse Olean Ree, RN.

## 2016-10-16 NOTE — Progress Notes (Signed)
CBG 508, pt receiving IV solu-medrol. MD aware, BMP ordered and order given to wait on BMP results before giving ss insulin.

## 2016-10-17 ENCOUNTER — Inpatient Hospital Stay (HOSPITAL_COMMUNITY): Payer: Medicare Other

## 2016-10-17 LAB — BASIC METABOLIC PANEL
ANION GAP: 11 (ref 5–15)
Anion gap: 8 (ref 5–15)
BUN: 77 mg/dL — AB (ref 6–20)
BUN: 76 mg/dL — AB (ref 6–20)
CALCIUM: 8.8 mg/dL — AB (ref 8.9–10.3)
CO2: 18 mmol/L — ABNORMAL LOW (ref 22–32)
CO2: 16 mmol/L — AB (ref 22–32)
CREATININE: 2.92 mg/dL — AB (ref 0.44–1.00)
Calcium: 8.8 mg/dL — ABNORMAL LOW (ref 8.9–10.3)
Chloride: 114 mmol/L — ABNORMAL HIGH (ref 101–111)
Chloride: 112 mmol/L — ABNORMAL HIGH (ref 101–111)
Creatinine, Ser: 2.93 mg/dL — ABNORMAL HIGH (ref 0.44–1.00)
GFR calc Af Amer: 17 mL/min — ABNORMAL LOW (ref >60)
GFR calc Af Amer: 17 mL/min — ABNORMAL LOW (ref >60)
GFR calc non Af Amer: 14 mL/min — ABNORMAL LOW (ref >60)
GFR, EST NON AFRICAN AMERICAN: 14 mL/min — AB (ref >60)
GLUCOSE: 202 mg/dL — AB (ref 65–99)
GLUCOSE: 231 mg/dL — AB (ref 65–99)
Potassium: 5.3 mmol/L — ABNORMAL HIGH (ref 3.5–5.1)
Potassium: 4.8 mmol/L (ref 3.5–5.1)
SODIUM: 140 mmol/L (ref 135–145)
Sodium: 139 mmol/L (ref 135–145)

## 2016-10-17 LAB — GLUCOSE, CAPILLARY
GLUCOSE-CAPILLARY: 180 mg/dL — AB (ref 65–99)
GLUCOSE-CAPILLARY: 166 mg/dL — AB (ref 65–99)
GLUCOSE-CAPILLARY: 239 mg/dL — AB (ref 65–99)
Glucose-Capillary: 218 mg/dL — ABNORMAL HIGH (ref 65–99)

## 2016-10-17 MED ORDER — METOPROLOL TARTRATE 25 MG PO TABS
12.5000 mg | ORAL_TABLET | Freq: Two times a day (BID) | ORAL | Status: DC
  Administered 2016-10-17 – 2016-10-18 (×2): 12.5 mg via ORAL
  Filled 2016-10-17 (×2): qty 1

## 2016-10-17 MED ORDER — SODIUM BICARBONATE 650 MG PO TABS: 650.0000 mg | ORAL_TABLET | Freq: Two times a day (BID) | ORAL | 1 refills | Status: DC

## 2016-10-17 MED ORDER — INSULIN ASPART 100 UNIT/ML ~~LOC~~ SOLN
0.0000 [IU] | Freq: Every day | SUBCUTANEOUS | Status: DC
  Administered 2016-10-17: 2 [IU] via SUBCUTANEOUS

## 2016-10-17 MED ORDER — PREDNISONE 10 MG PO TABS: 40.0000 mg | ORAL_TABLET | Freq: Every day | ORAL | 0 refills | Status: DC

## 2016-10-17 MED ORDER — FUROSEMIDE 40 MG PO TABS
40.0000 mg | ORAL_TABLET | Freq: Every day | ORAL | Status: DC
  Administered 2016-10-17 – 2016-10-18 (×2): 40 mg via ORAL
  Filled 2016-10-17 (×2): qty 1

## 2016-10-17 MED ORDER — AMLODIPINE BESYLATE 5 MG PO TABS
10.0000 mg | ORAL_TABLET | Freq: Every day | ORAL | Status: DC
  Administered 2016-10-17 – 2016-10-18 (×2): 10 mg via ORAL
  Filled 2016-10-17 (×2): qty 2

## 2016-10-17 MED ORDER — SODIUM POLYSTYRENE SULFONATE 15 GM/60ML PO SUSP
30.0000 g | Freq: Once | ORAL | Status: AC
  Administered 2016-10-17: 30 g via ORAL
  Filled 2016-10-17: qty 120

## 2016-10-17 MED ORDER — INSULIN ASPART 100 UNIT/ML ~~LOC~~ SOLN
0.0000 [IU] | Freq: Three times a day (TID) | SUBCUTANEOUS | Status: DC
  Administered 2016-10-17: 3 [IU] via SUBCUTANEOUS
  Administered 2016-10-17: 5 [IU] via SUBCUTANEOUS
  Administered 2016-10-18: 3 [IU] via SUBCUTANEOUS
  Administered 2016-10-18: 2 [IU] via SUBCUTANEOUS

## 2016-10-17 MED ORDER — PREDNISONE 20 MG PO TABS
50.0000 mg | ORAL_TABLET | Freq: Every day | ORAL | Status: DC
  Administered 2016-10-18: 50 mg via ORAL
  Filled 2016-10-17: qty 2

## 2016-10-17 MED ORDER — AMLODIPINE BESYLATE 10 MG PO TABS
10.0000 mg | ORAL_TABLET | Freq: Every day | ORAL | 1 refills | Status: DC
Start: 2016-10-18 — End: 2019-06-30

## 2016-10-17 NOTE — Care Management Important Message (Signed)
Important Message  Patient Details  [name] MRN: [id number] Date of Birth: [birth year]   Medicare Important Message Given:  Yes    Jazara Swiney, Chauncey Reading, RN 10/17/2016, 12:21 PM

## 2016-10-17 NOTE — Progress Notes (Signed)
Inpatient Diabetes Program Recommendations  AACE/ADA: New Consensus Statement on Inpatient Glycemic Control (2015)  Target Ranges:  Prepandial:   less than 140 mg/dL      Peak postprandial:   less than 180 mg/dL (1-2 hours)      Critically ill patients:  140 - 180 mg/dL   Results for BLYSS, LUGAR (MRN [id number]) as of 10/17/2016 07:52  Ref. Range 10/16/2016 07:59 10/16/2016 11:39 10/16/2016 17:11 10/16/2016 21:41  Glucose-Capillary Latest Ref Range: 65 - 99 mg/dL 182 (H) 508 (HH) 215 (H) 237 (H)   Results for MERCY, LEPPLA (MRN [id number]) as of 10/17/2016 07:52  Ref. Range 10/17/2016 07:42  Glucose-Capillary Latest Ref Range: 65 - 99 mg/dL 180 (H)    Admit with: SOB  History: DM, CKD, CHF, COPD  Home DM Meds: Tresiba 14 units daily  Current Insulin Orders: Lantus 5 units daily      Novolog Sensitive Correction Scale/ SSI (0-9 units) TID AC + HS       MD- Note patient received last dose Solumedrol yesterday at 10:30am.  Now getting Prednisone 60 mg daily.  Please consider the following in-hospital insulin adjustments:  1. Increase Lantus to 10 units daily   2. Start low dose Novolog Meal Coverage: Novolog 3 units TID with meals (hold if pt eats <50% of meal)      --Will follow patient during hospitalization--  Wyn Quaker RN, MSN, CDE Diabetes Coordinator Inpatient Glycemic Control Team Team Pager: [phone] (8a-5p)

## 2016-10-17 NOTE — Progress Notes (Signed)
PROGRESS NOTE  Jody [name] CZY:[id number] DOB: January 15, 1941 DOA: 10/15/2016 PCP: Jody Blitz, MD  Brief History:  76 y.o.femalewith medical history of diabetes mellitus, CKD stage IV, atrial fibrillation, diastolic CHF, COPD, hyperlipidemia, stroke presenting with one-day history of shortness of breath. The patient states that she was in her usual state of health until the morning of 10/14/2016 during which time she noted increasing shortness of breath with increasing nonproductive cough. In addition, the patient endorses some exertional chest discomfort and dyspnea on exertion for the better part of one month. She endorses compliance with her furosemide and diet. Her neighbor went to pick the patient up for church on the morning of 10/15/2016 when she noted the patient to have significant shortness of breath with exertion trying to get to the car. As result, the patient was brought to the emergency department for further evaluation. The patient saw her cardiologist, Dr. Bronson [name], on 10/12/2016 during which time clonidine was added to the patient's antihypertensive regimen. She was continued on her usual dose of furosemide 40 mg twice a day.  Assessment/Plan: COPD exacerbation -ContinuePulmicort -IV Solu-Medrol-->po prednisone taper -Continueduo nebs -Pulmonary hygiene -now stable on RA  Exertional dyspnea and chest discomfort/elevated troponin -Patient is pain-free at this time -question if dyspnea may also be related to her bradycardia -EKG shows sinus rhythm--nonspecific T wave changes -12/10/2014 stress test--low risk -Elevated troponin likely demand ischemia--trend is flat  Sinus bradycardia/Atrial fibrillation with slow ventricular response -Heart rate in the mid 30s-->improved off clonidine and diltiazem -May be related to the patient's newly introduced clonidine -Discontinue clonidine -d/c diltiazem -Patient is clinically asymptomatic -improved--HR now in  80-90  Hyperkalemia -received kayexalate x 2 doses during admission -due to acute on on chronic renal failure and RTA type 4 -may require once weekly kayexalate 15 grams  Essential hypertension -increasehydralazine 100 mg tid -Holding diltiazem and clonidine secondary to bradycardia -increased amlodipine to 10 mg daily -start low dose metoprolol tartrate and monitor HR  Chronic diastolic CHF -The patient appears clinically euvolemic -I feel that the "interstitial edema" on chest x-ray likely represents chronic interstitial findings secondary to her COPD -Daily weights -Holding furosemide secondary to acute on chronic renal failure-->restart lasix at lower dose -start lasix 40 mg po q day (previously bid)  Acute on chronic renal failure--CKD stage IV -Renal ultrasound--neg hydronephrosis -presenting creatinine 3.39 -improving with holding lasix -Baseline creatinine 2.4-2.7 -start po bicarbonate  Diabetes mellitus with neuropathy and nephropathy -holding tresiba -start lantus 5 units -novolog sliding scale -CBGs improving with decreasing steroids -10/15/16--A1C--4.7  Hyperlipidemia -Continue statin  Anemia of CKD -baseline Hgb ~8-9 -follows hematology -stable    Disposition Plan:   Home 9/5 if HR, K and creatinine stable Family Communication:  No Family at bedside  Consultants:  none  Code Status:  FULL   DVT Prophylaxis:  apixaban   Procedures: As Listed in Progress Note Above  Antibiotics: None    Subjective: Patient denies fevers, chills, headache, chest pain, dyspnea, nausea, vomiting, diarrhea, abdominal pain, dysuria, hematuria, hematochezia, and melena.   Objective: Vitals:   10/17/16 0424 10/17/16 0752 10/17/16 0757 10/17/16 1432  BP: (!) 152/74     Pulse: 66     Resp: 18     Temp: 98.6 F (37 C)     TempSrc: Oral     SpO2: 100% 99% 100% 97%  Weight:      Height:        Intake/Output Summary (Last 24  hours) at 10/17/16  1619 Last data filed at 10/17/16 0900  Gross per 24 hour  Intake              360 ml  Output              550 ml  Net             -190 ml   Weight change:  Exam:   General:  Pt is alert, follows commands appropriately, not in acute distress  HEENT: No icterus, No thrush, No neck mass, Jody [name]/AT  Cardiovascular: RRR, S1/S2, no rubs, no gallops  Respiratory: bibasilar rales.  No wheeze  Abdomen: Soft/+BS, non tender, non distended, no guarding  Extremities: No edema, No lymphangitis, No petechiae, No rashes, no synovitis   Data Reviewed: I have personally reviewed following labs and imaging studies Basic Metabolic Panel:  Recent Labs Lab 10/15/16 1105 10/15/16 1642 10/16/16 0447 10/16/16 1210 10/17/16 0630 10/17/16 1407  NA 139  --  139 138 140 139  K 5.7* 5.4* 5.4* 4.7 5.3* 4.8  CL 114*  --  112* 110 114* 112*  CO2 18*  --  17* 17* 18* 16*  GLUCOSE 92  --  207* 248* 202* 231*  BUN 66*  --  68* 69* 77* 76*  CREATININE 3.39*  --  3.36* 3.17* 2.92* 2.93*  CALCIUM 8.3*  --  8.3* 8.5* 8.8* 8.8*  MG 2.3  --   --   --   --   --    Liver Function Tests: No results for input(s): AST, ALT, ALKPHOS, BILITOT, PROT, ALBUMIN in the last 168 hours. No results for input(s): LIPASE, AMYLASE in the last 168 hours. No results for input(s): AMMONIA in the last 168 hours. Coagulation Profile: No results for input(s): INR, PROTIME in the last 168 hours. CBC:  Recent Labs Lab 10/13/16 1044 10/15/16 1105 10/16/16 0447  WBC 6.9 7.1 7.4  NEUTROABS 5.3  --   --   HGB 8.5* 8.5* 9.1*  HCT 25.8* 26.5* 28.6*  MCV 94.9 96.7 96.6  PLT 125* 136* 147*   Cardiac Enzymes:  Recent Labs Lab 10/15/16 1105 10/15/16 1642 10/15/16 2258  TROPONINI 0.12* 0.12* 0.14*   BNP: Invalid input(s): POCBNP CBG:  Recent Labs Lab 10/16/16 1139 10/16/16 1711 10/16/16 2141 10/17/16 0742 10/17/16 1103  GLUCAP 508* 215* 237* 180* 166*   HbA1C:  Recent Labs  10/15/16 1642  HGBA1C 4.7*    Urine analysis:    Component Value Date/Time   COLORURINE YELLOW 05/27/2014 1902   APPEARANCEUR CLEAR 05/27/2014 1902   LABSPEC 1.012 05/27/2014 1902   PHURINE 5.0 05/27/2014 1902   GLUCOSEU NEGATIVE 05/27/2014 1902   HGBUR NEGATIVE 05/27/2014 1902   BILIRUBINUR NEGATIVE 05/27/2014 1902   KETONESUR NEGATIVE 05/27/2014 1902   PROTEINUR 100 (A) 05/27/2014 1902   UROBILINOGEN 0.2 05/27/2014 1902   NITRITE NEGATIVE 05/27/2014 1902   LEUKOCYTESUR NEGATIVE 05/27/2014 1902   Sepsis Labs: @LABRCNTIP (procalcitonin:4,lacticidven:4) ) Recent Results (from the past 240 hour(s))  MRSA PCR Screening     Status: None   Collection Time: 10/15/16  3:58 PM  Result Value Ref Range Status   MRSA by PCR NEGATIVE NEGATIVE Final    Comment:        The GeneXpert MRSA Assay (FDA approved for NASAL specimens only), is one component of a comprehensive MRSA colonization surveillance program. It is not intended to diagnose MRSA infection nor to guide or monitor treatment for MRSA infections.  Scheduled Meds: . amLODipine  10 mg Oral Daily  . apixaban  2.5 mg Oral BID  . aspirin EC  81 mg Oral Daily  . atorvastatin  40 mg Oral q1800  . budesonide (PULMICORT) nebulizer solution  0.25 mg Nebulization BID  . furosemide  40 mg Oral Daily  . gabapentin  300 mg Oral Daily  . hydrALAZINE  100 mg Oral TID  . insulin aspart  0-15 Units Subcutaneous TID WC  . insulin aspart  0-5 Units Subcutaneous QHS  . insulin glargine  5 Units Subcutaneous Daily  . ipratropium-albuterol  3 mL Nebulization TID  . [START ON 10/18/2016] predniSONE  50 mg Oral Q breakfast  . sodium bicarbonate  650 mg Oral BID   Continuous Infusions:  Procedures/Studies: Dg Chest 2 View  Result Date: 10/15/2016 CLINICAL DATA:  Pt c/o SOB and Rt side CP since yesterday. Pt states symptoms worsened this AM. Hx diabetes, stroke, HTN, COPD, CKD, A-fib, former smoker EXAM: CHEST - 2 VIEW COMPARISON:  10/03/2015 FINDINGS: Persistent  central pulmonary vascular congestion with slight improvement in the interstitial edema or infiltrates seen previously. No new airspace disease. Stable cardiomegaly.  Atheromatous tortuous aorta. No effusion.  No pneumothorax. Anterior vertebral endplate spurring at multiple levels in the mid and lower thoracic spine. IMPRESSION: 1. Cardiomegaly with slight improvement in interstitial edema, persistent pulmonary vascular congestion Electronically Signed   By: Lucrezia Europe M.D.   On: 10/15/2016 12:23   [employer]  Result Date: 10/17/2016 CLINICAL DATA:  Acute on chronic renal failure. EXAM: RENAL / URINARY TRACT ULTRASOUND COMPLETE COMPARISON:  CT abdomen and pelvis 12/08/2010. FINDINGS: Right Kidney: Length: 8.9 cm. Echogenicity within normal limits. No mass or hydronephrosis visualized. Left Kidney: Length: 10.2 cm. Visualization is limited by bowel gas. Echogenicity within normal limits. No mass or hydronephrosis visualized. Bladder: Appears normal for degree of bladder distention. IMPRESSION: Negative for hydronephrosis. No acute abnormality. Visualization of the left kidney is not limited by bowel gas. Electronically Signed   By: Inge Rise M.D.   On: 10/17/2016 10:08    Raelie Lohr, DO  [facility] Pager [phone]  If 7PM-7AM, please contact night-coverage [email or web address] Password [email or web address] 10/17/2016, 4:19 PM   LOS: 2 days

## 2016-10-17 NOTE — H&P (Signed)
Patient reported to have a run beat of VT. On arrival to room to assess patient she is alert and oriented. Vitals as follows 150/72 manual  hr 77 temp 98.0 100% RA . She appears asymptomatic  for any cardiac distress. On tele monitor view she is now in NSR. Dr Olevia Bowens paged with above findings. Instructed by Dr Olevia Bowens verbally.[name] to continue to monitor patient closely

## 2016-10-18 DIAGNOSIS — J441 Chronic obstructive pulmonary disease with (acute) exacerbation: Principal | ICD-10-CM

## 2016-10-18 DIAGNOSIS — E1122 Type 2 diabetes mellitus with diabetic chronic kidney disease: Secondary | ICD-10-CM

## 2016-10-18 DIAGNOSIS — E1121 Type 2 diabetes mellitus with diabetic nephropathy: Secondary | ICD-10-CM

## 2016-10-18 DIAGNOSIS — I5032 Chronic diastolic (congestive) heart failure: Secondary | ICD-10-CM

## 2016-10-18 DIAGNOSIS — N184 Chronic kidney disease, stage 4 (severe): Secondary | ICD-10-CM

## 2016-10-18 DIAGNOSIS — R001 Bradycardia, unspecified: Secondary | ICD-10-CM

## 2016-10-18 DIAGNOSIS — N179 Acute kidney failure, unspecified: Secondary | ICD-10-CM

## 2016-10-18 DIAGNOSIS — I10 Essential (primary) hypertension: Secondary | ICD-10-CM

## 2016-10-18 DIAGNOSIS — D631 Anemia in chronic kidney disease: Secondary | ICD-10-CM

## 2016-10-18 LAB — BASIC METABOLIC PANEL
Anion gap: 8 (ref 5–15)
BUN: 72 mg/dL — ABNORMAL HIGH (ref 6–20)
CHLORIDE: 110 mmol/L (ref 101–111)
CO2: 21 mmol/L — ABNORMAL LOW (ref 22–32)
CREATININE: 2.56 mg/dL — AB (ref 0.44–1.00)
Calcium: 8.3 mg/dL — ABNORMAL LOW (ref 8.9–10.3)
GFR calc non Af Amer: 17 mL/min — ABNORMAL LOW (ref >60)
GFR, EST AFRICAN AMERICAN: 20 mL/min — AB (ref >60)
Glucose, Bld: 169 mg/dL — ABNORMAL HIGH (ref 65–99)
Potassium: 3.4 mmol/L — ABNORMAL LOW (ref 3.5–5.1)
SODIUM: 139 mmol/L (ref 135–145)

## 2016-10-18 LAB — GLUCOSE, CAPILLARY
GLUCOSE-CAPILLARY: 148 mg/dL — AB (ref 65–99)
GLUCOSE-CAPILLARY: 170 mg/dL — AB (ref 65–99)

## 2016-10-18 MED ORDER — POTASSIUM CHLORIDE CRYS ER 20 MEQ PO TBCR
40.0000 meq | EXTENDED_RELEASE_TABLET | Freq: Once | ORAL | Status: AC
  Administered 2016-10-18: 40 meq via ORAL
  Filled 2016-10-18: qty 2

## 2016-10-18 MED ORDER — FUROSEMIDE 40 MG PO TABS: 40.0000 mg | ORAL_TABLET | Freq: Every day | ORAL | 3 refills | Status: DC

## 2016-10-18 MED ORDER — METOPROLOL TARTRATE 25 MG PO TABS: 12.5000 mg | ORAL_TABLET | Freq: Two times a day (BID) | ORAL | 0 refills | Status: DC

## 2016-10-18 NOTE — Progress Notes (Signed)
Discharged home with instructions on medications and follow up visits, patient verbalized understanding. Prescriptions sent Pharmacy of choice documented on AVS. IV discontinued,catheter intact. Accompanied by staff to an awaiting vehicle.

## 2016-10-18 NOTE — Discharge Summary (Addendum)
Physician Discharge Summary  Jody [name] EUM:[id number] DOB: 06-09-1940 DOA: 10/15/2016  PCP: Monico Blitz, MD  Admit date: 10/15/2016 Discharge date: 10/18/2016  Admitted From: Home Disposition: Home  Recommendations for Outpatient Follow-up:  1. Follow up with PCP in 1-2 weeks 2. Please obtain BMP/CBC in one week 3. Follow-up with nephrology in 2 weeks 4. Follow-up with cardiology in 2 weeks  Discharge Condition:Stable CODE STATUS:Full code Diet recommendation: Heart Healthy / Carb Modified   Brief/Interim Summary: 76 y.o.femalewith medical history of diabetes mellitus, CKD stage IV, atrial fibrillation, diastolic CHF, COPD, hyperlipidemia, stroke presenting with one-day history of shortness of breath. The patient states that she was in her usual state of health until the morning of 10/14/2016 during which time she noted increasing shortness of breath with increasing nonproductive cough. In addition, the patient endorses some exertional chest discomfort and dyspnea on exertion for the better part of one month. She endorses compliance with her furosemide and diet. Her neighbor went to pick the patient up for church on the morning of 10/15/2016 when she noted the patient to have significant shortness of breath with exertion trying to get to the car. As result, the patient was brought to the emergency department for further evaluation. The patient saw her cardiologist, Dr. Bronson Ing, on 10/12/2016 during which time clonidine was added to the patient's antihypertensive regimen. She was continued on her usual dose of furosemide 40 mg twice a day.  Discharge Diagnoses:  Active Problems:   Essential hypertension   Hyperlipidemia   Type 2 diabetes mellitus with other circulatory complications (HCC)   CKD stage 4 due to type 2 diabetes mellitus (HCC)   Anemia in chronic kidney disease, Stage IV   COPD with acute exacerbation (HCC)   Chronic diastolic CHF (congestive heart failure) (HCC)    Elevated troponin   Type 2 diabetes mellitus with nephropathy (HCC)   Acute renal failure superimposed on stage 4 chronic kidney disease (HCC)   Chronic atrial fibrillation  COPD exacerbation Treated with intravenous steroids, bronchodilators, pulmonary hygiene and inhaled steroids. The patient significantly improved and now back on room air and breathing comfortably. She'll be discharged on a prednisone taper  Exertional dyspnea and chest discomfort/elevated troponin -Patient is pain-free at this time -question if dyspnea may also be related to her bradycardia -EKG shows sinus rhythm--nonspecific T wave changes -12/10/2014 stress test--low risk -Elevated troponin likely demand ischemia--trend is flat -Overall dyspnea has resolved.  Sinus bradycardia/chronic Atrial fibrillation with slow ventricular response -Heart rate in the mid 30s-->improved off clonidine and diltiazem -May be related to the patient's newly introduced clonidine -Discontinued clonidine -d/c diltiazem -Patient is clinically asymptomatic -improved--HR now in 80-90  Hyperkalemia -received kayexalate x 2 doses during admission -due to acute on on chronic renal failure and RTA type 4 -may require once weekly kayexalate 15 grams -Repeat chemistries in one week  Essential hypertension -increasehydralazine 100 mg tid -Holding diltiazem and clonidine secondary to bradycardia -increased amlodipine to 10 mg daily -started on low dose metoprolol tartrate. Heart rate has been stable  Chronic diastolic CHF -The patient appears clinically euvolemic -I feel that the "interstitial edema" on chest x-ray likely represents chronic interstitial findings secondary to her COPD -Lasix initially held secondary to acute on chronic renal failure-->restarted lasix at lower dose -started lasix 40 mg po q day (previously bid)  Acute on chronic renal failure--CKD stage IV -Renal ultrasound--neg hydronephrosis -presenting  creatinine 3.39 -improving with holding lasix -Baseline creatinine 2.4-2.7 -started po bicarbonate  Diabetes mellitus with neuropathy  and nephropathy -holding tresiba -started lantus 5 units -novolog sliding scale -CBGs improving with decreasing steroids -10/15/16--A1C--4.7 -Resumed outpatient regimen on discharge  Hyperlipidemia -Continue statin  Anemia of CKD -baseline Hgb ~8-9 -followshematology -stable  Discharge Instructions  Discharge Instructions    Diet - low sodium heart healthy    Complete by:  As directed    Increase activity slowly    Complete by:  As directed      Allergies as of 10/18/2016      Reactions   Codeine Nausea And Vomiting      Medication List    STOP taking these medications   cloNIDine 0.1 MG tablet Commonly known as:  CATAPRES   diltiazem 180 MG 24 hr capsule Commonly known as:  CARDIZEM CD   potassium chloride SA 20 MEQ tablet Commonly known as:  K-DUR,KLOR-CON     TAKE these medications   albuterol 108 (90 Base) MCG/ACT inhaler Commonly known as:  PROVENTIL HFA;VENTOLIN HFA Inhale 1-2 puffs into the lungs every 6 (six) hours as needed for wheezing or shortness of breath. Reported on 03/01/2015   amLODipine 10 MG tablet Commonly known as:  NORVASC Take 1 tablet (10 mg total) by mouth daily.   aspirin EC 81 MG tablet Take 81 mg by mouth daily.   atorvastatin 40 MG tablet Commonly known as:  LIPITOR Take 1 tablet by mouth daily.   B-D UF III MINI PEN NEEDLES 31G X 5 MM Misc Generic drug:  Insulin Pen Needle   ELIQUIS 2.5 MG Tabs tablet Generic drug:  apixaban Take 1 tablet (2.5 mg total) by mouth 2 (two) times daily.   fluticasone-salmeterol 230-21 MCG/ACT inhaler Commonly known as:  ADVAIR HFA Inhale 2 puffs into the lungs 2 (two) times daily.   furosemide 40 MG tablet Commonly known as:  LASIX Take 1 tablet (40 mg total) by mouth daily. What changed:  when to take this   gabapentin 300 MG capsule Commonly  known as:  NEURONTIN Take 1 capsule by mouth daily.   hydrALAZINE 100 MG tablet Commonly known as:  APRESOLINE Take 1 tablet (100 mg total) by mouth 3 (three) times daily.   metoprolol tartrate 25 MG tablet Commonly known as:  LOPRESSOR Take 0.5 tablets (12.5 mg total) by mouth 2 (two) times daily.   ONE TOUCH ULTRA TEST test strip Generic drug:  glucose blood   predniSONE 10 MG tablet Commonly known as:  DELTASONE Take 4 tablets (40 mg total) by mouth daily with breakfast. And decrease by one tablet daily   sodium bicarbonate 650 MG tablet Take 1 tablet (650 mg total) by mouth 2 (two) times daily.   TRESIBA FLEXTOUCH 100 UNIT/ML Sopn FlexTouch Pen Generic drug:  insulin degludec Inject 14 Units into the skin daily.            Discharge Care Instructions        Start     Ordered   10/19/16 0000  predniSONE (DELTASONE) 10 MG tablet  Daily with breakfast     10/17/16 1648   10/18/16 0000  amLODipine (NORVASC) 10 MG tablet  Daily     10/17/16 1648   10/18/16 0000  furosemide (LASIX) 40 MG tablet  Daily     10/18/16 1316   10/18/16 0000  metoprolol tartrate (LOPRESSOR) 25 MG tablet  2 times daily     10/18/16 1316   10/18/16 0000  Increase activity slowly     10/18/16 1316   10/18/16 0000  Diet -  low sodium heart healthy     10/18/16 1316   10/17/16 0000  sodium bicarbonate 650 MG tablet  2 times daily     10/17/16 1648     Follow-up Information    Fran Lowes, MD. Schedule an appointment as soon as possible for a visit in 2 week(s).   Specialty:  Nephrology Contact information: [address] [place] [address] [phone]        Herminio Commons, MD. Schedule an appointment as soon as possible for a visit in 2 week(s).   Specialty:  Cardiology Contact information: [place] [address] [phone]        Monico Blitz, MD. Schedule an appointment as soon as possible for a visit in 2 week(s).   Specialty:   Internal Medicine Contact information: [address] [place] [address] [phone]          Allergies  Allergen Reactions  . Codeine Nausea And Vomiting    Consultations:     Procedures/Studies: Dg Chest 2 View  Result Date: 10/15/2016 CLINICAL DATA:  Pt c/o SOB and Rt side CP since yesterday. Pt states symptoms worsened this AM. Hx diabetes, stroke, HTN, COPD, CKD, A-fib, former smoker EXAM: CHEST - 2 VIEW COMPARISON:  10/03/2015 FINDINGS: Persistent central pulmonary vascular congestion with slight improvement in the interstitial edema or infiltrates seen previously. No new airspace disease. Stable cardiomegaly.  Atheromatous tortuous aorta. No effusion.  No pneumothorax. Anterior vertebral endplate spurring at multiple levels in the mid and lower thoracic spine. IMPRESSION: 1. Cardiomegaly with slight improvement in interstitial edema, persistent pulmonary vascular congestion Electronically Signed   By: Lucrezia Europe M.D.   On: 10/15/2016 12:23   [employer] [facility]  Result Date: 10/17/2016 CLINICAL DATA:  Acute on chronic renal failure. EXAM: RENAL / URINARY TRACT ULTRASOUND COMPLETE COMPARISON:  CT abdomen and pelvis 12/08/2010. FINDINGS: Right Kidney: Length: 8.9 cm. Echogenicity within normal limits. No mass or hydronephrosis visualized. Left Kidney: Length: 10.2 cm. Visualization is limited by bowel gas. Echogenicity within normal limits. No mass or hydronephrosis visualized. Bladder: Appears normal for degree of bladder distention. IMPRESSION: Negative for hydronephrosis. No acute abnormality. Visualization of the left kidney is not limited by bowel gas. Electronically Signed   By: Inge Rise M.D.   On: 10/17/2016 10:08    Echo: - Mild LVH with LVEF 60-65% and grade 2 diastolic dysfunction.   Moderate left atrial enlargement. Mild calcified mitral annulus   with trivial mitral regurgitation. Moderately sclerotic aortic   valve without stenosis. Trivial aortic  regurgitation. Trivial   tricuspid regurgitation.   Subjective: Feeling better. No shortness of breath. Wants to go home.  Discharge Exam: Vitals:   10/18/16 0731 10/18/16 1453  BP:    Pulse:    Resp:    Temp:    SpO2: 97% 99%   Vitals:   10/18/16 0500 10/18/16 0535 10/18/16 0731 10/18/16 1453  BP:  (!) 171/71    Pulse:  62    Resp:  20    Temp:  98.3 F (36.8 C)    TempSrc:  Oral    SpO2:  100% 97% 99%  Weight: 100.8 kg (222 lb 4.8 oz)     Height:        General: Pt is alert, awake, not in acute distress Cardiovascular: RRR, S1/S2 +, no rubs, no gallops Respiratory: CTA bilaterally, no wheezing, no rhonchi Abdominal: Soft, NT, ND, bowel sounds + Extremities: no edema, no cyanosis  The results of significant diagnostics from this hospitalization (including imaging, microbiology, ancillary and laboratory) are listed below for reference.     Microbiology: Recent Results (from the past 240 hour(s))  MRSA PCR Screening     Status: None   Collection Time: 10/15/16  3:58 PM  Result Value Ref Range Status   MRSA by PCR NEGATIVE NEGATIVE Final    Comment:        The GeneXpert MRSA Assay (FDA approved for NASAL specimens only), is one component of a comprehensive MRSA colonization surveillance program. It is not intended to diagnose MRSA infection nor to guide or monitor treatment for MRSA infections.      Labs: BNP (last 3 results)  Recent Labs  10/15/16 1105  BNP 242.3*   Basic Metabolic Panel:  Recent Labs Lab 10/15/16 1105  10/16/16 0447 10/16/16 1210 10/17/16 0630 10/17/16 1407 10/18/16 0601  NA 139  --  139 138 140 139 139  K 5.7*  < > 5.4* 4.7 5.3* 4.8 3.4*  CL 114*  --  112* 110 114* 112* 110  CO2 18*  --  17* 17* 18* 16* 21*  GLUCOSE 92  --  207* 248* 202* 231* 169*  BUN 66*  --  68* 69* 77* 76* 72*  CREATININE 3.39*  --  3.36* 3.17* 2.92* 2.93* 2.56*  CALCIUM 8.3*  --  8.3* 8.5* 8.8* 8.8* 8.3*  MG 2.3  --   --   --   --   --   --    < > = values in this interval not displayed. Liver Function Tests: No results for input(s): AST, ALT, ALKPHOS, BILITOT, PROT, ALBUMIN in the last 168 hours. No results for input(s): LIPASE, AMYLASE in the last 168 hours. No results for input(s): AMMONIA in the last 168 hours. CBC:  Recent Labs Lab 10/13/16 1044 10/15/16 1105 10/16/16 0447  WBC 6.9 7.1 7.4  NEUTROABS 5.3  --   --   HGB 8.5* 8.5* 9.1*  HCT 25.8* 26.5* 28.6*  MCV 94.9 96.7 96.6  PLT 125* 136* 147*   Cardiac Enzymes:  Recent Labs Lab 10/15/16 1105 10/15/16 1642 10/15/16 2258  TROPONINI 0.12* 0.12* 0.14*   BNP: Invalid input(s): POCBNP CBG:  Recent Labs Lab 10/17/16 1103 10/17/16 1708 10/17/16 2024 10/18/16 0815 10/18/16 1104  GLUCAP 166* 218* 239* 148* 170*   D-Dimer No results for input(s): DDIMER in the last 72 hours. Hgb A1c No results for input(s): HGBA1C in the last 72 hours. Lipid Profile No results for input(s): CHOL, HDL, LDLCALC, TRIG, CHOLHDL, LDLDIRECT in the last 72 hours. Thyroid function studies No results for input(s): TSH, T4TOTAL, T3FREE, THYROIDAB in the last 72 hours.  Invalid input(s): FREET3 Anemia work up No results for input(s): VITAMINB12, FOLATE, FERRITIN, TIBC, IRON, RETICCTPCT in the last 72 hours. Urinalysis    Component Value Date/Time   COLORURINE YELLOW 05/27/2014 1902   APPEARANCEUR CLEAR 05/27/2014 1902   LABSPEC 1.012 05/27/2014 1902   PHURINE 5.0 05/27/2014 1902   GLUCOSEU NEGATIVE 05/27/2014 1902   HGBUR NEGATIVE 05/27/2014 1902   BILIRUBINUR NEGATIVE 05/27/2014 1902   KETONESUR NEGATIVE 05/27/2014 1902   PROTEINUR 100 (A) 05/27/2014 1902   UROBILINOGEN 0.2 05/27/2014 1902   NITRITE NEGATIVE 05/27/2014 1902   LEUKOCYTESUR NEGATIVE 05/27/2014 1902   Sepsis Labs Invalid input(s): PROCALCITONIN,  WBC,  LACTICIDVEN Microbiology Recent Results (from the past 240 hour(s))  MRSA PCR Screening     Status: None   Collection Time: 10/15/16  3:58 PM  Result Value Ref Range Status   MRSA by PCR NEGATIVE NEGATIVE Final    Comment:        The GeneXpert MRSA Assay (FDA approved for NASAL specimens only), is one component of a comprehensive MRSA colonization surveillance program. It is not intended to diagnose MRSA infection nor to guide or monitor treatment for MRSA infections.      Time coordinating discharge: Over 30 minutes  SIGNED:   Kathie Dike, MD  [facility] 10/18/2016, 7:30 PM Pager   If 7PM-7AM, please contact night-coverage [email or web address] Password [email or web address]

## 2016-10-25 ENCOUNTER — Ambulatory Visit (HOSPITAL_COMMUNITY): Payer: Medicare Other

## 2016-10-25 ENCOUNTER — Other Ambulatory Visit (HOSPITAL_COMMUNITY): Payer: Medicare Other

## 2016-10-26 ENCOUNTER — Other Ambulatory Visit (HOSPITAL_COMMUNITY): Payer: Medicare Other

## 2016-10-26 ENCOUNTER — Ambulatory Visit (HOSPITAL_COMMUNITY): Payer: Medicare Other

## 2016-10-27 ENCOUNTER — Ambulatory Visit (HOSPITAL_COMMUNITY): Payer: Medicare Other

## 2016-10-27 ENCOUNTER — Other Ambulatory Visit (HOSPITAL_COMMUNITY): Payer: Medicare Other

## 2016-10-27 ENCOUNTER — Ambulatory Visit (HOSPITAL_COMMUNITY): Payer: Medicare Other | Admitting: Oncology

## 2016-10-30 ENCOUNTER — Encounter (HOSPITAL_COMMUNITY): Payer: Medicare Other | Attending: Oncology

## 2016-10-30 ENCOUNTER — Encounter (HOSPITAL_BASED_OUTPATIENT_CLINIC_OR_DEPARTMENT_OTHER): Payer: Medicare Other

## 2016-10-30 ENCOUNTER — Encounter (HOSPITAL_COMMUNITY): Payer: Self-pay | Admitting: Oncology

## 2016-10-30 ENCOUNTER — Ambulatory Visit (HOSPITAL_COMMUNITY): Payer: Medicare Other

## 2016-10-30 ENCOUNTER — Encounter (HOSPITAL_BASED_OUTPATIENT_CLINIC_OR_DEPARTMENT_OTHER): Payer: Medicare Other | Admitting: Oncology

## 2016-10-30 ENCOUNTER — Other Ambulatory Visit (HOSPITAL_COMMUNITY): Payer: Medicare Other

## 2016-10-30 VITALS — BP 145/65 | HR 64 | Resp 20 | Ht 67.0 in | Wt 227.0 lb

## 2016-10-30 DIAGNOSIS — N184 Chronic kidney disease, stage 4 (severe): Secondary | ICD-10-CM | POA: Diagnosis not present

## 2016-10-30 DIAGNOSIS — D631 Anemia in chronic kidney disease: Secondary | ICD-10-CM | POA: Diagnosis not present

## 2016-10-30 DIAGNOSIS — I10 Essential (primary) hypertension: Secondary | ICD-10-CM | POA: Diagnosis not present

## 2016-10-30 DIAGNOSIS — D509 Iron deficiency anemia, unspecified: Secondary | ICD-10-CM | POA: Diagnosis not present

## 2016-10-30 DIAGNOSIS — N189 Chronic kidney disease, unspecified: Secondary | ICD-10-CM | POA: Diagnosis not present

## 2016-10-30 LAB — CBC WITH DIFFERENTIAL/PLATELET
Basophils Absolute: 0 10*3/uL (ref 0.0–0.1)
Basophils Relative: 0 %
EOS ABS: 0.3 10*3/uL (ref 0.0–0.7)
Eosinophils Relative: 3 %
HCT: 27.3 % — ABNORMAL LOW (ref 36.0–46.0)
Hemoglobin: 8.5 g/dL — ABNORMAL LOW (ref 12.0–15.0)
Lymphocytes Relative: 10 %
Lymphs Abs: 0.9 10*3/uL (ref 0.7–4.0)
MCH: 31.5 pg (ref 26.0–34.0)
MCHC: 31.1 g/dL (ref 30.0–36.0)
MCV: 101.1 fL — ABNORMAL HIGH (ref 78.0–100.0)
MONO ABS: 0.6 10*3/uL (ref 0.1–1.0)
MONOS PCT: 6 %
Neutro Abs: 7 10*3/uL (ref 1.7–7.7)
Neutrophils Relative %: 81 %
PLATELETS: 120 10*3/uL — AB (ref 150–400)
RBC: 2.7 MIL/uL — ABNORMAL LOW (ref 3.87–5.11)
RDW: 19.1 % — AB (ref 11.5–15.5)
WBC: 8.7 10*3/uL (ref 4.0–10.5)

## 2016-10-30 LAB — FERRITIN: FERRITIN: 331 ng/mL — AB (ref 11–307)

## 2016-10-30 MED ORDER — DARBEPOETIN ALFA 100 MCG/0.5ML IJ SOSY
PREFILLED_SYRINGE | INTRAMUSCULAR | Status: AC
  Filled 2016-10-30: qty 0.5

## 2016-10-30 MED ORDER — DARBEPOETIN ALFA 100 MCG/0.5ML IJ SOSY
75.0000 ug | PREFILLED_SYRINGE | Freq: Once | INTRAMUSCULAR | Status: AC
  Administered 2016-10-30: 76 ug via SUBCUTANEOUS

## 2016-10-30 NOTE — Progress Notes (Signed)
Jody [name] presents today for injection per MD orders. Aranesp 76 mcg administered SQ in left lower abdomen. Administration without incident. Patient tolerated well. Patient tolerated treatment without incidence. Patient discharged via wheelchair and in stable condition from clinic. Patient to follow up as scheduled.

## 2016-10-30 NOTE — Progress Notes (Signed)
[name], [name] [address]   CLINIC:  Medical Oncology/Hematology  PCP:  Monico Blitz, [place] [address] [phone]   REASON FOR VISIT:  Follow-up for anemia in the setting of chronic kidney disease   CURRENT THERAPY: Work-up for therapy consideration.     HISTORY OF PRESENT ILLNESS:  (From Kirby Crigler, PA-C's last note: 04/25/16)     INTERVAL HISTORY:  [name] 76 y.o. female here for routine follow-up for anemia due to stage IV chronic kidney disease.   Patient presents for follow-up today. She states that recently she went to go see the ophthalmologist and was found to have blood behind her left eye, which is likely the cause of her ongoing headaches. She states that her energy level is good, she denies any fatigue. She states that she has chronic lower extremity weakness, however she ambulates at home with a walker. She denies any shortness of breath, chest pain, abdominal pain. October 30, 2016 She was  recently hospitalized for what appears to be congestive heart failure and increasing shortness of breath. Hemoglobin is stromal somewhat since last hospitalization Here for further follow-up.  Might have missed injection or 2   Of ARANESP. She  is poor historian  REVIEW OF SYSTEMS:  Review of Systems  Constitutional: Negative for appetite change, chills, fatigue and fever.  HENT:  Negative.   Eyes: Negative.   Respiratory: Negative for cough and shortness of breath.        Chronic cough and dyspnea on exertion; inhaler helps symptoms   Cardiovascular: Negative for chest pain, leg swelling and palpitations.  Gastrointestinal: Negative.  Negative for abdominal pain, blood in stool, constipation, diarrhea, nausea and vomiting.  Endocrine: Negative.   Genitourinary: Negative.  Negative for dysuria, hematuria and vaginal bleeding.   Musculoskeletal: Positive for arthralgias.  Neurological: Positive for  extremity weakness (in legs). Negative for dizziness and headaches.  Hematological: Negative.   Psychiatric/Behavioral: Negative for sleep disturbance.     PAST MEDICAL/SURGICAL HISTORY:  Past Medical History:  Diagnosis Date  . Anemia in chronic kidney disease (CODE) 06/05/2015  . Arthritis    "bad in my legs" (12/07/2014)  . Atrial fibrillation ([place]) 06/05/2015  . Chronic back pain    "dr said my spine is crooked"  . Chronic bronchitis ([place])    "get it q yr" (12/07/2014)  . CKD stage 4 due to type 2 diabetes mellitus ([place]) 06/05/2015  . COPD (chronic obstructive pulmonary disease) ([place])   . Gait difficulty 09/02/2014  . Heart disease   . Hypercholesterolemia   . Hypertension   . Neuropathy 2015   in legs  . Stroke ([place]) 05/2014   denies residual on 12/07/2014  . Stroke with cerebral ischemia ([place])   . Type II diabetes mellitus ([place])    Past Surgical History:  Procedure Laterality Date  . ABDOMINAL HYSTERECTOMY  ~ 1970  . APPENDECTOMY  ~ 1970  . JOINT REPLACEMENT    . TOTAL KNEE ARTHROPLASTY Right ~ 1986  . TUMOR EXCISION  ~ 1970   "9# in my stomach"     SOCIAL HISTORY:  Social History   Social History  . Marital status: Widowed    Spouse name: N/A  . Number of children: 0  . Years of education: 9   Occupational History  . retired    Social History Main Topics  . Smoking status: Former Smoker    Packs/day: 0.50    Years:  50.00    Types: Cigarettes    Start date: 03/07/1953    Quit date: 01/14/2004  . Smokeless tobacco: Never Used     Comment: "quit smoking cigarettes  in ~ 2005"  . Alcohol use No     Comment: 10/242016 "quit drinking beer in ~ 1977"  . Drug use: No  . Sexual activity: No   Other Topics Concern  . Not on file   Social History Narrative   Patient drinks caffeine a few times a week.   Patient is right handed.    FAMILY HISTORY:  Family History  Problem Relation Age of Onset  . Cancer Other   . Heart attack Brother   . Cancer  Sister        breast  . Alcohol abuse Brother     CURRENT MEDICATIONS:  Outpatient Encounter Prescriptions as of 10/30/2016  Medication Sig  . albuterol (PROVENTIL HFA;VENTOLIN HFA) 108 (90 BASE) MCG/ACT inhaler Inhale 1-2 puffs into the lungs every 6 (six) hours as needed for wheezing or shortness of breath. Reported on 03/01/2015  . amLODipine (NORVASC) 10 MG tablet Take 1 tablet (10 mg total) by mouth daily.  [name] aspirin EC 81 MG tablet Take 81 mg by mouth daily.  [name] atorvastatin (LIPITOR) 40 MG tablet Take 1 tablet by mouth daily.  . B-D UF III MINI PEN NEEDLES 31G X 5 MM MISC   . ELIQUIS 2.5 MG TABS tablet Take 1 tablet (2.5 mg total) by mouth 2 (two) times daily.  . fluticasone-salmeterol (ADVAIR HFA) 230-21 MCG/ACT inhaler Inhale 2 puffs into the lungs 2 (two) times daily.  . furosemide (LASIX) 40 MG tablet Take 1 tablet (40 mg total) by mouth daily.  [name] gabapentin (NEURONTIN) 300 MG capsule Take 1 capsule by mouth daily.  . hydrALAZINE (APRESOLINE) 100 MG tablet Take 1 tablet (100 mg total) by mouth 3 (three) times daily.  . Insulin Degludec (TRESIBA FLEXTOUCH) 100 UNIT/ML SOPN Inject 14 Units into the skin daily.   . metoprolol tartrate (LOPRESSOR) 25 MG tablet Take 0.5 tablets (12.5 mg total) by mouth 2 (two) times daily.  . ONE TOUCH ULTRA TEST test strip   . predniSONE (DELTASONE) 10 MG tablet Take 4 tablets (40 mg total) by mouth daily with breakfast. And decrease by one tablet daily  . sodium bicarbonate 650 MG tablet Take 1 tablet (650 mg total) by mouth 2 (two) times daily.   No facility-administered encounter medications on file as of 10/30/2016.     ALLERGIES:  Allergies  Allergen Reactions  . Codeine Nausea And Vomiting     PHYSICAL EXAM:  ECOG Performance status: 2-3 - Symptomatic; requires assistance.   There were no vitals filed for this visit. There were no vitals filed for this visit.  Physical Exam  Constitutional: She is oriented to person, place, and time  and well-developed, well-nourished, and in no distress. No distress.  Examined seated in wheelchair   HENT:  Head: Normocephalic and atraumatic.  Mouth/Throat: Oropharynx is clear and moist. No oropharyngeal exudate.  Eyes: Pupils are equal, round, and reactive to light. Conjunctivae are normal. No scleral icterus.  Neck: Normal range of motion. Neck supple. No JVD present.  Cardiovascular: Normal rate, regular rhythm and normal heart sounds.  Exam reveals no gallop and no friction rub.   No murmur heard. Bradycardia   Pulmonary/Chest: Effort normal and breath sounds normal. No respiratory distress. She has no wheezes. She has no rales.  Abdominal: Soft. Bowel sounds are normal.  She exhibits no distension. There is no tenderness. There is no rebound and no guarding.  Musculoskeletal: She exhibits no edema or tenderness.  Lymphadenopathy:    She has no cervical adenopathy.       Right: No supraclavicular adenopathy present.       Left: No supraclavicular adenopathy present.  Neurological: She is alert and oriented to person, place, and time. No cranial nerve deficit.  Skin: Skin is warm and dry. No rash noted. No erythema. No pallor.  Psychiatric: Mood, memory, affect and judgment normal.  Nursing note and vitals reviewed.    LABORATORY DATA:  I have reviewed the labs as listed.  CBC    Component Value Date/Time   WBC 8.7 10/30/2016 1006   RBC 2.70 (L) 10/30/2016 1006   HGB 8.5 (L) 10/30/2016 1006   HCT 27.3 (L) 10/30/2016 1006   PLT 120 (L) 10/30/2016 1006   MCV 101.1 (H) 10/30/2016 1006   MCH 31.5 10/30/2016 1006   MCHC 31.1 10/30/2016 1006   RDW 19.1 (H) 10/30/2016 1006   LYMPHSABS 0.9 10/30/2016 1006   MONOABS 0.6 10/30/2016 1006   EOSABS 0.3 10/30/2016 1006   BASOSABS 0.0 10/30/2016 1006   CMP Latest Ref Rng & Units 10/18/2016 10/17/2016 10/17/2016  Glucose 65 - 99 mg/dL 169(H) 231(H) 202(H)  BUN 6 - 20 mg/dL 72(H) 76(H) 77(H)  Creatinine 0.44 - 1.00 mg/dL 2.56(H) 2.93(H)  2.92(H)  Sodium 135 - 145 mmol/L 139 139 140  Potassium 3.5 - 5.1 mmol/L 3.4(L) 4.8 5.3(H)  Chloride 101 - 111 mmol/L 110 112(H) 114(H)  CO2 22 - 32 mmol/L 21(L) 16(L) 18(L)  Calcium 8.9 - 10.3 mg/dL 8.3(L) 8.8(L) 8.8(L)  Total Protein 6.5 - 8.1 g/dL - - -  Total Bilirubin 0.3 - 1.2 mg/dL - - -  Alkaline Phos 38 - 126 U/L - - -  AST 15 - 41 U/L - - -  ALT 14 - 54 U/L - - -   Results for RAVON, MCILHENNY (MRN [id number])   Ref. Range 04/25/2016 12:00  Iron Latest Ref Range: 28 - 170 ug/dL 56  UIBC Latest Units: ug/dL 281  TIBC Latest Ref Range: 250 - 450 ug/dL 337  Saturation Ratios Latest Ref Range: 10.4 - 31.8 % 17  Ferritin Latest Ref Range: 11 - 307 ng/mL 84   Results for ANNALI, LYBRAND (MRN [id number])   Ref. Range 04/25/2016 12:01  Folate Latest Ref Range: >5.9 ng/mL 16.2   Results for SUZZANNE, BRUNKHORST (MRN [id number])   Ref. Range 04/25/2016 12:00  CRP Latest Ref Range: <1.0 mg/dL <0.8  Vitamin B12 Latest Ref Range: 180 - 914 pg/mL 441   Results for DEAUNDRA, DUPRIEST (MRN [id number])  Ref. Range 04/25/2016 12:00  Basophils Absolute Latest Ref Range: 0.0 - 0.1 K/uL 0.0  RBC Morphology Unknown SLIGHT ANISOCYTOSIS  RBC. Latest Ref Range: 3.87 - 5.11 MIL/uL 2.79 (L)  Retic Ct Pct Latest Ref Range: 0.4 - 3.1 % 3.9 (H)  Retic Count, Manual Latest Ref Range: 19.0 - 186.0 K/uL 108.8   Results for NATEISHA, MOYD (MRN [id number])  Ref. Range 04/25/2016 12:00  Haptoglobin Latest Ref Range: 34 - 200 mg/dL 147  Erythropoietin Latest Ref Range: 2.6 - 18.5 mIU/mL 32.5 (H)  Sed Rate Latest Ref Range: 0 - 22 mm/hr 124 (H)   Results for KRISTA, GODSIL (MRN [id number])  Ref. Range 04/25/2016 12:00  Hgb A Latest Ref Range: 96.4 - 98.8 % 97.8  Hgb A2 Quant Latest Ref Range: 1.8 - 3.2 % 2.2  Hgb F Quant Latest Ref Range: 0.0 - 2.0 % 0.0  Hgb S Quant Latest Ref Range: 0.0 % 0.0  HGB C Latest Ref Range: 0.0 % 0.0  HGB VARIANT Latest Ref Range: 0.0 % 0.0   Results for MONTOYA, WATKIN (MRN  [id number])  Ref. Range 04/25/2016 12:01  Total Protein ELP Latest Ref Range: 6.0 - 8.5 g/dL 7.2  Albumin ELP Latest Ref Range: 2.9 - 4.4 g/dL 3.3  Globulin, Total Latest Ref Range: 2.2 - 3.9 g/dL 3.9  A/G Ratio Latest Ref Range: 0.7 - 1.7  0.8  Alpha-1-Globulin Latest Ref Range: 0.0 - 0.4 g/dL 0.2  Alpha-2-Globulin Latest Ref Range: 0.4 - 1.0 g/dL 0.8  Beta Globulin Latest Ref Range: 0.7 - 1.3 g/dL 1.0  Gamma Globulin Latest Ref Range: 0.4 - 1.8 g/dL 1.9 (H)  M-SPIKE, % Latest Ref Range: Not Observed g/dL Not Observed         ASSESSMENT & PLAN:   Iron deficiency anemia:  Ferritin level is not available at present time If it is less than 100 patients should receive Feraheme Overall patient is not responding to darbepoetin.  May have to increase the dose of frequency. We will recheck hemoglobin reevaluate patient in 4 weeks Patient would benefit being evaluated by nephrologist  Anemia due to chronic kidney disease:  -Continue Aranesp injections every 2 weeks for hemoglobin 11 g/dL or less. Hemoglobin 9.4 g/dL today.  RTC in 3 months for follow up.    All questions were answered to patient's stated satisfaction. Encouraged patient to call with any new concerns or questions before her next visit to the cancer center and we can certain see her sooner, if needed.     Twana First, MD

## 2016-10-30 NOTE — Patient Instructions (Signed)
[place] at [facility] Discharge Instructions  RECOMMENDATIONS MADE BY THE CONSULTANT AND ANY TEST RESULTS WILL BE SENT TO YOUR REFERRING PHYSICIAN.  You received your Aranesp injection today Continue getting it every 14 days. Follow up as scheduled.  Thank you for choosing [place] at [facility] to provide your oncology and hematology care.  To afford each patient quality time with our provider, please arrive at least 15 minutes before your scheduled appointment time.    If you have a lab appointment with the [facility] please come in thru the  Main Entrance and check in at the main information desk  You need to re-schedule your appointment should you arrive 10 or more minutes late.  We strive to give you quality time with our providers, and arriving late affects you and other patients whose appointments are after yours.  Also, if you no show three or more times for appointments you may be dismissed from the clinic at the providers discretion.     Again, thank you for choosing [facility].  Our hope is that these requests will decrease the amount of time that you wait before being seen by our physicians.       _____________________________________________________________  Should you have questions after your visit to [facility], please contact our office at [phone] between the hours of 8:30 a.m. and 4:30 p.m.  Voicemails left after 4:30 p.m. will not be returned until the following business day.  For prescription refill requests, have your pharmacy contact our office.       Resources For Cancer Patients and their Caregivers ? American Cancer Society: Can assist with transportation, wigs, general needs, runs Look Good Feel Better.        662-484-1032 ? Cancer Care: Provides financial assistance, online support groups, medication/co-pay assistance.  1-800-813-HOPE 412-561-8630) ? [facility] Assists [place] cancer patients and their families through emotional , educational and financial support.  360-769-2097 ? [place] DSS Where to apply for food stamps, Medicaid and utility assistance. (205)696-0150 ? RCATS: Transportation to medical appointments. 703-773-3902 ? Social Security Administration: May apply for disability if have a Stage IV cancer. (217)571-1022 606-084-5478 ? [employer], Disability and Transit Services: Assists with nutrition, care and transit needs. [place] Support Programs: @10RELATIVEDAYS @ > Cancer Support Group  2nd Tuesday of the month 1pm-2pm, Journey Room  > Creative Journey  3rd Tuesday of the month 1130am-1pm, Journey Room  > Look Good Feel Better  1st Wednesday of the month 10am-12 noon, Journey Room (Call [place] to register [phone])

## 2016-11-02 ENCOUNTER — Encounter (HOSPITAL_COMMUNITY): Payer: Self-pay | Admitting: *Deleted

## 2016-11-13 ENCOUNTER — Other Ambulatory Visit (HOSPITAL_COMMUNITY): Payer: Medicare Other

## 2016-11-13 ENCOUNTER — Ambulatory Visit (HOSPITAL_COMMUNITY): Payer: Medicare Other

## 2016-11-17 ENCOUNTER — Encounter (HOSPITAL_COMMUNITY): Payer: Self-pay

## 2016-11-17 ENCOUNTER — Encounter (HOSPITAL_COMMUNITY): Payer: Medicare Other | Attending: Oncology

## 2016-11-17 VITALS — BP 136/66 | HR 63

## 2016-11-17 DIAGNOSIS — G8929 Other chronic pain: Secondary | ICD-10-CM | POA: Diagnosis not present

## 2016-11-17 DIAGNOSIS — Z8673 Personal history of transient ischemic attack (TIA), and cerebral infarction without residual deficits: Secondary | ICD-10-CM | POA: Insufficient documentation

## 2016-11-17 DIAGNOSIS — D631 Anemia in chronic kidney disease: Secondary | ICD-10-CM | POA: Diagnosis not present

## 2016-11-17 DIAGNOSIS — Z885 Allergy status to narcotic agent status: Secondary | ICD-10-CM | POA: Diagnosis not present

## 2016-11-17 DIAGNOSIS — Z811 Family history of alcohol abuse and dependence: Secondary | ICD-10-CM | POA: Insufficient documentation

## 2016-11-17 DIAGNOSIS — Z8249 Family history of ischemic heart disease and other diseases of the circulatory system: Secondary | ICD-10-CM | POA: Insufficient documentation

## 2016-11-17 DIAGNOSIS — D509 Iron deficiency anemia, unspecified: Secondary | ICD-10-CM | POA: Diagnosis not present

## 2016-11-17 DIAGNOSIS — Z90710 Acquired absence of both cervix and uterus: Secondary | ICD-10-CM | POA: Diagnosis not present

## 2016-11-17 DIAGNOSIS — I4891 Unspecified atrial fibrillation: Secondary | ICD-10-CM | POA: Diagnosis not present

## 2016-11-17 DIAGNOSIS — R5382 Chronic fatigue, unspecified: Secondary | ICD-10-CM | POA: Insufficient documentation

## 2016-11-17 DIAGNOSIS — E7800 Pure hypercholesterolemia, unspecified: Secondary | ICD-10-CM | POA: Diagnosis not present

## 2016-11-17 DIAGNOSIS — Z87891 Personal history of nicotine dependence: Secondary | ICD-10-CM | POA: Insufficient documentation

## 2016-11-17 DIAGNOSIS — Z96651 Presence of right artificial knee joint: Secondary | ICD-10-CM | POA: Insufficient documentation

## 2016-11-17 DIAGNOSIS — J449 Chronic obstructive pulmonary disease, unspecified: Secondary | ICD-10-CM | POA: Insufficient documentation

## 2016-11-17 DIAGNOSIS — Z79899 Other long term (current) drug therapy: Secondary | ICD-10-CM | POA: Insufficient documentation

## 2016-11-17 DIAGNOSIS — G629 Polyneuropathy, unspecified: Secondary | ICD-10-CM | POA: Insufficient documentation

## 2016-11-17 DIAGNOSIS — N184 Chronic kidney disease, stage 4 (severe): Secondary | ICD-10-CM | POA: Insufficient documentation

## 2016-11-17 DIAGNOSIS — Z9889 Other specified postprocedural states: Secondary | ICD-10-CM | POA: Insufficient documentation

## 2016-11-17 DIAGNOSIS — Z803 Family history of malignant neoplasm of breast: Secondary | ICD-10-CM | POA: Diagnosis not present

## 2016-11-17 DIAGNOSIS — E1122 Type 2 diabetes mellitus with diabetic chronic kidney disease: Secondary | ICD-10-CM | POA: Diagnosis not present

## 2016-11-17 DIAGNOSIS — Z7982 Long term (current) use of aspirin: Secondary | ICD-10-CM | POA: Diagnosis not present

## 2016-11-17 DIAGNOSIS — Z7952 Long term (current) use of systemic steroids: Secondary | ICD-10-CM | POA: Diagnosis not present

## 2016-11-17 DIAGNOSIS — I129 Hypertensive chronic kidney disease with stage 1 through stage 4 chronic kidney disease, or unspecified chronic kidney disease: Secondary | ICD-10-CM | POA: Insufficient documentation

## 2016-11-17 LAB — CBC WITH DIFFERENTIAL/PLATELET
BASOS ABS: 0 10*3/uL (ref 0.0–0.1)
Basophils Relative: 0 %
EOS PCT: 6 %
Eosinophils Absolute: 0.3 10*3/uL (ref 0.0–0.7)
HEMATOCRIT: 26.3 % — AB (ref 36.0–46.0)
HEMOGLOBIN: 8.5 g/dL — AB (ref 12.0–15.0)
LYMPHS ABS: 0.9 10*3/uL (ref 0.7–4.0)
LYMPHS PCT: 18 %
MCH: 31.7 pg (ref 26.0–34.0)
MCHC: 32.3 g/dL (ref 30.0–36.0)
MCV: 98.1 fL (ref 78.0–100.0)
Monocytes Absolute: 0.4 10*3/uL (ref 0.1–1.0)
Monocytes Relative: 8 %
NEUTROS ABS: 3.3 10*3/uL (ref 1.7–7.7)
Neutrophils Relative %: 68 %
Platelets: 124 10*3/uL — ABNORMAL LOW (ref 150–400)
RBC: 2.68 MIL/uL — AB (ref 3.87–5.11)
RDW: 17.3 % — ABNORMAL HIGH (ref 11.5–15.5)
WBC: 4.8 10*3/uL (ref 4.0–10.5)

## 2016-11-17 LAB — FERRITIN: FERRITIN: 305 ng/mL (ref 11–307)

## 2016-11-17 MED ORDER — DARBEPOETIN ALFA 200 MCG/0.4ML IJ SOSY
PREFILLED_SYRINGE | INTRAMUSCULAR | Status: AC
  Filled 2016-11-17: qty 0.4

## 2016-11-17 MED ORDER — DARBEPOETIN ALFA 200 MCG/0.4ML IJ SOSY
200.0000 ug | PREFILLED_SYRINGE | Freq: Once | INTRAMUSCULAR | Status: AC
  Administered 2016-11-17: 200 ug via SUBCUTANEOUS

## 2016-11-17 NOTE — Patient Instructions (Signed)
[place] at [facility] Discharge Instructions  RECOMMENDATIONS MADE BY THE CONSULTANT AND ANY TEST RESULTS WILL BE SENT TO YOUR REFERRING PHYSICIAN.  Aranesp given today, dose was increased per MD Follow up as scheduled  Thank you for choosing [place] at [facility] to provide your oncology and hematology care.  To afford each patient quality time with our provider, please arrive at least 15 minutes before your scheduled appointment time.    If you have a lab appointment with the [facility] please come in thru the  Main Entrance and check in at the main information desk  You need to re-schedule your appointment should you arrive 10 or more minutes late.  We strive to give you quality time with our providers, and arriving late affects you and other patients whose appointments are after yours.  Also, if you no show three or more times for appointments you may be dismissed from the clinic at the providers discretion.     Again, thank you for choosing [facility].  Our hope is that these requests will decrease the amount of time that you wait before being seen by our physicians.       _____________________________________________________________  Should you have questions after your visit to [facility], please contact our office at [phone] between the hours of 8:30 a.m. and 4:30 p.m.  Voicemails left after 4:30 p.m. will not be returned until the following business day.  For prescription refill requests, have your pharmacy contact our office.       Resources For Cancer Patients and their Caregivers ? American Cancer Society: Can assist with transportation, wigs, general needs, runs Look Good Feel Better.        (818) 715-4124 ? Cancer Care: Provides financial assistance, online support groups, medication/co-pay assistance.  1-800-813-HOPE (315)568-4891) ? [facility] Assists [place] cancer patients and their families through emotional , educational and financial support.  979-247-7097 ? [place] DSS Where to apply for food stamps, Medicaid and utility assistance. (201)358-4421 ? RCATS: Transportation to medical appointments. 9108036250 ? Social Security Administration: May apply for disability if have a Stage IV cancer. 607-751-3476 (601)138-4661 ? [employer], Disability and Transit Services: Assists with nutrition, care and transit needs. [place] Support Programs: @10RELATIVEDAYS @ > Cancer Support Group  2nd Tuesday of the month 1pm-2pm, Journey Room  > Creative Journey  3rd Tuesday of the month 1130am-1pm, Journey Room  > Look Good Feel Better  1st Wednesday of the month 10am-12 noon, Journey Room (Call [place] to register [phone])

## 2016-11-17 NOTE — Progress Notes (Signed)
Labs reviewed with MD. Hemoglobin has stayed at 8.5 past month. Will increase dose per MD orders. Continue labs biweekly.   Jody [name] presents today for injection per MD orders. Aranesp 273mcg administered SQ in left Abdomen. Administration without incident. Patient tolerated well.   Treatment given per orders. Patient tolerated it well without problems. Vitals stable and discharged home from clinic via wheelchair. Follow up as scheduled.

## 2016-11-20 ENCOUNTER — Ambulatory Visit (HOSPITAL_COMMUNITY): Payer: Medicare Other

## 2016-11-20 ENCOUNTER — Other Ambulatory Visit (HOSPITAL_COMMUNITY): Payer: Medicare Other

## 2016-11-21 ENCOUNTER — Encounter (HOSPITAL_COMMUNITY): Payer: Self-pay

## 2016-11-21 ENCOUNTER — Ambulatory Visit (HOSPITAL_COMMUNITY): Payer: Medicare Other | Admitting: Physical Therapy

## 2016-11-27 ENCOUNTER — Ambulatory Visit (HOSPITAL_COMMUNITY): Payer: Medicare Other

## 2016-11-27 ENCOUNTER — Other Ambulatory Visit (HOSPITAL_COMMUNITY): Payer: Medicare Other

## 2016-11-29 ENCOUNTER — Encounter: Payer: Self-pay | Admitting: Cardiovascular Disease

## 2016-11-29 ENCOUNTER — Ambulatory Visit (INDEPENDENT_AMBULATORY_CARE_PROVIDER_SITE_OTHER): Payer: Medicare Other | Admitting: Cardiovascular Disease

## 2016-11-29 VITALS — BP 144/88 | HR 59 | Ht 67.0 in | Wt 216.0 lb

## 2016-11-29 DIAGNOSIS — I10 Essential (primary) hypertension: Secondary | ICD-10-CM

## 2016-11-29 DIAGNOSIS — I472 Ventricular tachycardia: Secondary | ICD-10-CM

## 2016-11-29 DIAGNOSIS — N189 Chronic kidney disease, unspecified: Secondary | ICD-10-CM

## 2016-11-29 DIAGNOSIS — I4729 Other ventricular tachycardia: Secondary | ICD-10-CM

## 2016-11-29 DIAGNOSIS — Z9289 Personal history of other medical treatment: Secondary | ICD-10-CM

## 2016-11-29 DIAGNOSIS — J449 Chronic obstructive pulmonary disease, unspecified: Secondary | ICD-10-CM

## 2016-11-29 DIAGNOSIS — I5032 Chronic diastolic (congestive) heart failure: Secondary | ICD-10-CM

## 2016-11-29 DIAGNOSIS — I480 Paroxysmal atrial fibrillation: Secondary | ICD-10-CM

## 2016-11-29 DIAGNOSIS — D631 Anemia in chronic kidney disease: Secondary | ICD-10-CM

## 2016-11-29 NOTE — Progress Notes (Signed)
SUBJECTIVE: The patient presents for follow-up of malignant hypertension, atrial fibrillation, chronic diastolic heart failure, and nonsustained ventricular tachycardia.  She was hospitalized for a COPD exacerbation in early September. She had sinus bradycardia and slow atrial fibrillation and clonidine and diltiazem were stopped. Heart rate was in the 80-90 beat per minute range at discharge.  She has chronic exertional dyspnea due to COPD which is no worse. She denies chest pain. She has occasional palpitations. She said her blood pressure fluctuates at home. Her weight has remained relatively stable at home. Blood sugar this morning was 140.  She is not certain if she is still taking prednisone. She denies bleeding problems.   Review of Systems: As per "subjective", otherwise negative.  Allergies  Allergen Reactions  . Codeine Nausea And Vomiting    Current Outpatient Prescriptions  Medication Sig Dispense Refill  . albuterol (PROVENTIL HFA;VENTOLIN HFA) 108 (90 BASE) MCG/ACT inhaler Inhale 1-2 puffs into the lungs every 6 (six) hours as needed for wheezing or shortness of breath. Reported on 03/01/2015    . amLODipine (NORVASC) 10 MG tablet Take 1 tablet (10 mg total) by mouth daily. 30 tablet 1  . aspirin EC 81 MG tablet Take 81 mg by mouth daily.    [name] atorvastatin (LIPITOR) 40 MG tablet Take 1 tablet by mouth daily.  0  . B-D UF III MINI PEN NEEDLES 31G X 5 MM MISC   0  . ELIQUIS 2.5 MG TABS tablet Take 1 tablet (2.5 mg total) by mouth 2 (two) times daily. 60 tablet 3  . fluticasone-salmeterol (ADVAIR HFA) 230-21 MCG/ACT inhaler Inhale 2 puffs into the lungs 2 (two) times daily.    . furosemide (LASIX) 40 MG tablet Take 1 tablet (40 mg total) by mouth daily. 60 tablet 3  . gabapentin (NEURONTIN) 300 MG capsule Take 1 capsule by mouth daily.  0  . hydrALAZINE (APRESOLINE) 100 MG tablet Take 1 tablet (100 mg total) by mouth 3 (three) times daily. 270 tablet 3  . Insulin  Degludec (TRESIBA FLEXTOUCH) 100 UNIT/ML SOPN Inject 14 Units into the skin daily.     . metoprolol tartrate (LOPRESSOR) 25 MG tablet Take 0.5 tablets (12.5 mg total) by mouth 2 (two) times daily. 30 tablet 0  . ONE TOUCH ULTRA TEST test strip     . predniSONE (DELTASONE) 10 MG tablet Take 4 tablets (40 mg total) by mouth daily with breakfast. And decrease by one tablet daily 10 tablet 0  . sodium bicarbonate 650 MG tablet Take 1 tablet (650 mg total) by mouth 2 (two) times daily. 60 tablet 1   No current facility-administered medications for this visit.     Past Medical History:  Diagnosis Date  . Anemia in chronic kidney disease (CODE) 06/05/2015  . Arthritis    "bad in my legs" (12/07/2014)  . Atrial fibrillation ([place]) 06/05/2015  . Chronic back pain    "dr said my spine is crooked"  . Chronic bronchitis ([place])    "get it q yr" (12/07/2014)  . CKD stage 4 due to type 2 diabetes mellitus ([place]) 06/05/2015  . COPD (chronic obstructive pulmonary disease) ([place])   . Gait difficulty 09/02/2014  . Heart disease   . Hypercholesterolemia   . Hypertension   . Neuropathy 2015   in legs  . Stroke ([place]) 05/2014   denies residual on 12/07/2014  . Stroke with cerebral ischemia ([place])   . Type II diabetes mellitus ([place])  Past Surgical History:  Procedure Laterality Date  . ABDOMINAL HYSTERECTOMY  ~ 1970  . APPENDECTOMY  ~ 1970  . JOINT REPLACEMENT    . TOTAL KNEE ARTHROPLASTY Right ~ 1986  . TUMOR EXCISION  ~ 1970   "9# in my stomach"    Social History   Social History  . Marital status: Widowed    Spouse name: N/A  . Number of children: 0  . Years of education: 9   Occupational History  . retired    Social History Main Topics  . Smoking status: Former Smoker    Packs/day: 0.50    Years: 50.00    Types: Cigarettes    Start date: 03/07/1953    Quit date: 01/14/2004  . Smokeless tobacco: Never Used     Comment: "quit smoking cigarettes  in ~ 2005"  . Alcohol use No      Comment: 10/242016 "quit drinking beer in ~ 1977"  . Drug use: No  . Sexual activity: No   Other Topics Concern  . Not on file   Social History Narrative   Patient drinks caffeine a few times a week.   Patient is right handed.     Vitals:   11/29/16 1051  BP: (!) 144/88  Pulse: (!) 59  SpO2: 96%  Weight: 216 lb (98 kg)  Height: 5\' 7"  (1.702 m)    Wt Readings from Last 3 Encounters:  11/29/16 216 lb (98 kg)  10/30/16 227 lb (103 kg)  10/18/16 222 lb 4.8 oz (100.8 kg)     PHYSICAL EXAM General: NAD HEENT: Normal. Neck: No JVD, no thyromegaly. Lungs: Diminished throughout, no crackles or wheezes. CV: Nondisplaced PMI.  Regular rate and irregular rhythm, normal S1/S2, no S3, no murmur. No pretibial or periankle edema.   Abdomen: Soft, nontender, no distention.  Neurologic: Alert and oriented.  Psych: Normal affect. Skin: Normal. Musculoskeletal: No gross deformities.    ECG: Most recent ECG reviewed.   Labs: Lab Results  Component Value Date/Time   K 3.4 (L) 10/18/2016 06:01 AM   BUN 72 (H) 10/18/2016 06:01 AM   CREATININE 2.56 (H) 10/18/2016 06:01 AM   ALT 24 04/25/2016 12:00 PM   TSH 2.217 10/03/2015 08:58 AM   TSH 1.247 05/28/2014 07:45 AM   HGB 8.5 (L) 11/17/2016 09:43 AM     Lipids: Lab Results  Component Value Date/Time   LDLCALC 92 10/04/2015 03:01 AM   CHOL 156 10/04/2015 03:01 AM   TRIG 51 10/04/2015 03:01 AM   HDL 54 10/04/2015 03:01 AM       ASSESSMENT AND PLAN: 1. Atrial fibrillation: Stable on low-dose metoprolol and low-dose Eliquis (due to anemia, hemoglobin 8.5 on 11/17/16).  2. Chronic diastolic heart failure: Euvolemic on Lasix 40 mg daily. Aim to control blood pressure.  3. Malignant HTN: Mildly elevated on amlodipine, hydralazine, and low-dose metoprolol. Long-acting diltiazem and clonidine were discontinued due to significant bradycardia while hospitalized in early September 2018.  Creatinine 2.56 on 10/18/16 so I am unable to  start ACE inhibitors, angiotensin receptor blockers nor diuretics or spironolactone. No changes to therapy.  4. Non-sustained ventricular tachycardia: Currently on low-dose metoprolol. No ischemia on stress testing October 2016.  5. Abnormal nuclear stress test October 2016: Probable artifact based on normal regional wall motion. Will monitor. Continue ASA 81 mg and statin.  6. Anemia due to CKD: Managed by hematology.  7. COPD: Stable. I asked her to check at home to make certain she is not still  taking prednisone, as this can lead to fluid retention, steroid-induced myopathy, and accelerate osteoporosis.    Disposition: Follow up 6 months.   Kate Sable, M.D., F.A.C.C.

## 2016-11-29 NOTE — Patient Instructions (Signed)

## 2016-11-30 ENCOUNTER — Other Ambulatory Visit (HOSPITAL_COMMUNITY): Payer: Self-pay | Admitting: *Deleted

## 2016-12-01 ENCOUNTER — Ambulatory Visit (HOSPITAL_COMMUNITY): Payer: Medicare Other | Attending: Internal Medicine

## 2016-12-01 ENCOUNTER — Encounter (HOSPITAL_BASED_OUTPATIENT_CLINIC_OR_DEPARTMENT_OTHER): Payer: Medicare Other

## 2016-12-01 ENCOUNTER — Encounter (HOSPITAL_COMMUNITY): Payer: Medicare Other

## 2016-12-01 ENCOUNTER — Encounter (HOSPITAL_COMMUNITY): Payer: Self-pay

## 2016-12-01 ENCOUNTER — Encounter (HOSPITAL_BASED_OUTPATIENT_CLINIC_OR_DEPARTMENT_OTHER): Payer: Medicare Other | Admitting: Oncology

## 2016-12-01 VITALS — BP 145/113 | HR 58 | Resp 18

## 2016-12-01 DIAGNOSIS — Z7409 Other reduced mobility: Secondary | ICD-10-CM | POA: Diagnosis present

## 2016-12-01 DIAGNOSIS — D631 Anemia in chronic kidney disease: Secondary | ICD-10-CM

## 2016-12-01 DIAGNOSIS — M6281 Muscle weakness (generalized): Secondary | ICD-10-CM | POA: Insufficient documentation

## 2016-12-01 DIAGNOSIS — D509 Iron deficiency anemia, unspecified: Secondary | ICD-10-CM

## 2016-12-01 DIAGNOSIS — N189 Chronic kidney disease, unspecified: Secondary | ICD-10-CM

## 2016-12-01 DIAGNOSIS — R2681 Unsteadiness on feet: Secondary | ICD-10-CM | POA: Diagnosis not present

## 2016-12-01 DIAGNOSIS — I129 Hypertensive chronic kidney disease with stage 1 through stage 4 chronic kidney disease, or unspecified chronic kidney disease: Secondary | ICD-10-CM | POA: Diagnosis not present

## 2016-12-01 LAB — CBC WITH DIFFERENTIAL/PLATELET
BASOS PCT: 1 %
Basophils Absolute: 0 10*3/uL (ref 0.0–0.1)
EOS ABS: 0.2 10*3/uL (ref 0.0–0.7)
Eosinophils Relative: 5 %
HEMATOCRIT: 28.6 % — AB (ref 36.0–46.0)
HEMOGLOBIN: 9.1 g/dL — AB (ref 12.0–15.0)
Lymphocytes Relative: 14 %
Lymphs Abs: 0.7 10*3/uL (ref 0.7–4.0)
MCH: 30.8 pg (ref 26.0–34.0)
MCHC: 31.8 g/dL (ref 30.0–36.0)
MCV: 96.9 fL (ref 78.0–100.0)
MONOS PCT: 7 %
Monocytes Absolute: 0.4 10*3/uL (ref 0.1–1.0)
NEUTROS PCT: 73 %
Neutro Abs: 3.4 10*3/uL (ref 1.7–7.7)
Platelets: 145 10*3/uL — ABNORMAL LOW (ref 150–400)
RBC: 2.95 MIL/uL — AB (ref 3.87–5.11)
RDW: 16.5 % — ABNORMAL HIGH (ref 11.5–15.5)
WBC: 4.7 10*3/uL (ref 4.0–10.5)

## 2016-12-01 LAB — FERRITIN: Ferritin: 241 ng/mL (ref 11–307)

## 2016-12-01 MED ORDER — DARBEPOETIN ALFA 500 MCG/ML IJ SOSY
PREFILLED_SYRINGE | INTRAMUSCULAR | Status: AC
  Filled 2016-12-01: qty 1

## 2016-12-01 MED ORDER — DARBEPOETIN ALFA 500 MCG/ML IJ SOSY
500.0000 ug | PREFILLED_SYRINGE | Freq: Once | INTRAMUSCULAR | Status: AC
  Administered 2016-12-01: 500 ug via SUBCUTANEOUS

## 2016-12-01 NOTE — Progress Notes (Signed)
Pt given Aranesp injection in upper right abdomen. Pt tolerated well. Pt stable and discharged home with caregiver via wheelchair.

## 2016-12-01 NOTE — Therapy (Signed)
[place], [place], [address] Phone: [phone]   Fax:  [phone]  Physical Therapy Evaluation  Patient Details  [name] MRN: [id number] Date of Birth: 01-24-1941 Referring Provider: Monico Blitz MD  Encounter Date: 12/01/2016      PT End of Session - 12/01/16 1520    Visit Number 1   Number of Visits 9   Date for PT Re-Evaluation 12/22/16   Authorization Type [employer] Medicare   Authorization Time Period 12/01/2016 - 12/30/2016   Authorization - Visit Number 1   Authorization - Number of Visits 10   PT Start Time 3614   PT Stop Time 1518   PT Time Calculation (min) 42 min   Equipment Utilized During Treatment Gait belt;Other (comment)  FWW   Activity Tolerance Patient limited by fatigue   Behavior During Therapy [facility] for tasks assessed/performed      Past Medical History:  Diagnosis Date  . Anemia in chronic kidney disease (CODE) 06/05/2015  . Arthritis    "bad in my legs" (12/07/2014)  . Atrial fibrillation ([place]) 06/05/2015  . Chronic back pain    "dr said my spine is crooked"  . Chronic bronchitis ([place])    "get it q yr" (12/07/2014)  . CKD stage 4 due to type 2 diabetes mellitus ([place]) 06/05/2015  . COPD (chronic obstructive pulmonary disease) ([place])   . Gait difficulty 09/02/2014  . Heart disease   . Hypercholesterolemia   . Hypertension   . Neuropathy 2015   in legs  . Stroke ([place]) 05/2014   denies residual on 12/07/2014  . Stroke with cerebral ischemia ([place])   . Type II diabetes mellitus ([place])     Past Surgical History:  Procedure Laterality Date  . ABDOMINAL HYSTERECTOMY  ~ 1970  . APPENDECTOMY  ~ 1970  . JOINT REPLACEMENT    . TOTAL KNEE ARTHROPLASTY Right ~ 1986  . TUMOR EXCISION  ~ 1970   "9# in my stomach"    There were no vitals filed for this visit.       Subjective Assessment - 12/01/16 1437    Subjective Patient notes for about 3 years she has had weakness in  her legs and she reports having therapy in [place] before around the time it initially started. She reports that she has a Rt. knee replacement and needs to get the Lt. done. She notes her "legs feel heavy" and she gets SOB during ambulation and is most limiting factors.    Pertinent History COPD, hx heart attack, CHF, arthritis, hx stroke 2016 affect just her speech, bilateral neuropathy    Limitations Walking;Standing;House hold activities   How long can you stand comfortably? 15-20 minutes   How long can you walk comfortably? approximately 40 ft x 4 laps at one time    Currently in Pain? Yes   Pain Score 10-Worst pain ever   Pain Location Knee   Pain Orientation Left;Anterior   Pain Descriptors / Indicators Sharp  during ambulation only    Pain Type Chronic pain   Pain Onset More than a month ago   Pain Frequency Intermittent   Aggravating Factors  ambulation   Pain Relieving Factors resting             [facility] PT Assessment - 12/01/16 0001      Assessment   Medical Diagnosis Unsteady Gait   Referring Provider Monico Blitz MD   Prior Therapy yes     Balance  Screen   Has the patient fallen in the past 6 months No   Has the patient had a decrease in activity level because of a fear of falling?  No   Is the patient reluctant to leave their home because of a fear of falling?  No     Home Environment   Living Environment Private residence   Living Arrangements Alone   Available Help at Discharge Other (Comment)  Aide part of day M-F   Additional Comments --     Observation/Other Assessments   Focus on Therapeutic Outcomes (FOTO)  60% limited     Functional Tests   Functional tests Sit to Stand;Single leg stance     Single Leg Stance   Comments unable Lt. LE secondary to Rt weakness, Rt. SLS 2 seconds Bilat UE assist on walker     Sit to Stand   Comments bilatural UE assist, multiple attemps 3/5 trials     Strength   Right Hip Flexion 3/5   Right Hip ABduction 4/5   chair   Left Hip Flexion 3+/5   Left Hip ABduction 4/5  chair   Right Knee Flexion 4/5   Right Knee Extension 4/5   Left Knee Flexion 4/5   Left Knee Extension 4/5   Right Ankle Dorsiflexion 4/5   Left Ankle Dorsiflexion 4+/5     Ambulation/Gait   Ambulation Distance (Feet) 268 Feet   Assistive device Rolling walker   Gait Pattern Step-through pattern   Gait velocity 3MWT- 0.45 m/s     Standardized Balance Assessment   Five times sit to stand comments  35 seconds  Wheezing     Timed Up and Go Test   TUG Normal TUG   Normal TUG (seconds) 22   TUG Comments Rolling walker            Objective measurements completed on examination: See above findings.          [place] Adult PT Treatment/Exercise - 12/01/16 0001      Posture/Postural Control   Posture Comments Increased kyphosis      Knee/Hip Exercises: Seated   Long Arc Quad 5 reps;2 sets;Both   Knee/Hip Flexion Glute set x 10   Marching Both;5 reps;2 sets   Marching Limitations Rt req'd UE assist   Hamstring Limitations next session c tband   Abduction/Adduction  5 reps;2 sets;Both                  PT Short Term Goals - 12/01/16 1527      PT SHORT TERM GOAL #1   Title Patient will be independent and compliant with HEP to improve overall endurance and quality ambulation.    Time 2   Period Weeks   Status New     PT SHORT TERM GOAL #2   Title Patient will have improved MMT grade by 1/2 grade at least to improve overall functional strength.    Time 2   Period Weeks   Status New     PT SHORT TERM GOAL #3   Title Pt will ambulate >320 ft. during 3MWT to indicate improved endurance with FWW or at least 268 ft. with LRAD.    Time 3   Period Weeks   Status New           PT Long Term Goals - 12/01/16 1529      PT LONG TERM GOAL #1   Title Patient will have improved MMT grade by at least 1 full  grade to improve overall functional strength.    Time 4   Period Weeks   Status New      PT LONG TERM GOAL #2   Title Pt will ambulate at least 400 ft with FWW in 3 MWT or with a LRAD.    Time 4   Period Weeks   Status New     PT LONG TERM GOAL #3   Title Pt will show imprved endurance with 5xSTS by completing in < 30 seconds.    Time 4   Period Weeks   Status New     PT LONG TERM GOAL #4   Title Pt will have improved gait speed with TUG to demonstrate improved safety to <18 seconds with FWW or LRAD.    Time 4   Period Weeks   Status New     PT LONG TERM GOAL #5   Title Pt will have improved FOTO outcome measure score by 10%.    Time 4   Period Weeks   Status New                Plan - 12-20-16 1523    Clinical Impression Statement Patient is a 76 year old woman with chronic LE weakness and decreased endurance. She has a significant medical history including but not limited to COPD, CHF, HTN and arthritis all contributing to her present symptoms of decreased endurance, decreased strength and pain upon ambulation > 1 minute. She will benefit from skilled PT to address the above impairments to improve overall quality and safety of ambulation at home and in the community.    Clinical Presentation Stable   Clinical Presentation due to: Neuropathy, COPD, CHF, hx of heart attack and stroke, OA   Clinical Decision Making Moderate   Rehab Potential Fair   Clinical Impairments Affecting Rehab Potential (-) significant medical history, previous PT for current issue 3 yr ago; (+) motivated to ambulate with LRAD   PT Frequency 2x / week   PT Duration 4 weeks   PT Treatment/Interventions ADLs/Self Care Home Management;Stair training;Functional mobility training;Therapeutic activities;Therapeutic exercise;Balance training;Neuromuscular re-education;Gait training;DME Instruction;Patient/family education;Manual techniques   PT Next Visit Plan Review Eval, goals and HEP; dynamic balance exercises, seated hip strengthening exercises    PT Home Exercise Plan Eval: chair  exercises glute set x 10, knee extension 2x5, marching 2x5, abduction 2x5   Consulted and Agree with Plan of Care Patient      Patient will benefit from skilled therapeutic intervention in order to improve the following deficits and impairments:  Abnormal gait, Decreased balance, Decreased activity tolerance, Cardiopulmonary status limiting activity, Decreased endurance, Decreased strength, Difficulty walking, Obesity, Postural dysfunction, Improper body mechanics  Visit Diagnosis: Unsteady gait  Muscle weakness (generalized)  Decreased functional mobility and endurance      G-Codes - 12/20/16 1538    Functional Assessment Tool Used (Outpatient Only) FOTO   Functional Limitation Mobility: Walking and moving around   Mobility: Walking and Moving Around Current Status [phone]) At least 60 percent but less than 80 percent impaired, limited or restricted   Mobility: Walking and Moving Around Goal Status [phone]) At least 40 percent but less than 60 percent impaired, limited or restricted       Problem List Patient Active Problem List   Diagnosis Date Noted  . Chronic diastolic CHF (congestive heart failure) ([place]) 10/15/2016  . Elevated troponin 10/15/2016  . Type 2 diabetes mellitus with nephropathy ([place]) 10/15/2016  . Acute renal failure superimposed on stage  4 chronic kidney disease ([place]) 10/15/2016  . NSTEMI (non-ST elevated myocardial infarction) ([place]) 10/03/2015  . Elevated LFTs 10/03/2015  . Encounter for therapeutic drug monitoring 07/29/2015  . COPD with acute exacerbation ([place])   . COPD (chronic obstructive pulmonary disease) ([place]) 06/05/2015  . CKD stage 4 due to type 2 diabetes mellitus ([place]) 06/05/2015  . Anemia in chronic kidney disease, Stage IV 06/05/2015  . Acute on chronic diastolic CHF (congestive heart failure) ([place]) 06/05/2015  . Atrial fibrillation ([place]) 06/05/2015  . CHF exacerbation ([place]) 06/05/2015  . Atrial fibrillation with RVR ([place]) 12/08/2014  . Atrial  fibrillation with rapid ventricular response ([place]) 12/07/2014  . TIA (transient ischemic attack) 09/02/2014  . Gait difficulty 09/02/2014  . Other specified transient cerebral ischemias   . Stroke with cerebral ischemia ([place])   . Hemispheric carotid artery syndrome   . Essential hypertension   . Hyperlipidemia   . Type 2 diabetes mellitus with other circulatory complications ([place])   . CVA (cerebral infarction) 05/27/2014    [name] PT, DPT 3:42 PM, 12/01/16 [place], [place], [address] Phone: [phone]   Fax:  [phone]  [name] MRN: [id number] Date of Birth: 04-06-1940

## 2016-12-01 NOTE — Patient Instructions (Signed)
  Glute Set  In a seated position, contract glute muscles.   X10, 5 sec hold   SEATED MARCHING  While seated in a chair, lift up your foot and knee, set it down and then perform on the other leg. Repeat this alternating movement.   5 each leg x 2 sets   PARTIAL ARC QUAD - LOW SEAT  While seated with your knee in a bent position and your heel touching the ground, slowly straighten your knee as you raise your foot upwards as shown. Lower your foot back down until your heel touches the grounod and then repeat.  5 reps, 2 sets   HIP ABDUCTION - SINGLE- SEATED - STRAIGHT LEG  Start by sitting close to the edge of a chair with your target leg straight at the knee.  Next, slide your target leg to the side. You can slide your heel across the floor as you perform. Then return to straight ahead. Maintain your toes pointed up the entire time.    2 sets, 5 reps

## 2016-12-01 NOTE — Progress Notes (Signed)
[place], [place] [address]   CLINIC:  Medical Oncology/Hematology  PCP:  Monico Blitz, [place] [address] [phone]   REASON FOR VISIT:  Follow-up for anemia in the setting of chronic kidney disease   CURRENT THERAPY: Work-up for therapy consideration.     HISTORY OF PRESENT ILLNESS:  (From Kirby Crigler, PA-C's last note: 04/25/16)     INTERVAL HISTORY:  Ms. Morgenthaler 76 y.o. female here for routine follow-up for anemia due to stage IV chronic kidney disease.   Hemoglobin is 9.1 g/dL today. She has her chronic joint pains and chronic fatigue but otherwise no new complaints today. She denies any chest pain, shortness of breath, palpitations, focal weakness.  REVIEW OF SYSTEMS:  Review of Systems  Constitutional: Positive for fatigue. Negative for appetite change, chills and fever.  HENT:  Negative.   Eyes: Negative.   Respiratory: Negative for cough and shortness of breath.        Dyspnea on exertion  Cardiovascular: Negative for chest pain, leg swelling and palpitations.  Gastrointestinal: Negative.  Negative for abdominal pain, blood in stool, constipation, diarrhea, nausea and vomiting.  Endocrine: Negative.   Genitourinary: Negative.  Negative for dysuria, hematuria and vaginal bleeding.   Musculoskeletal: Positive for arthralgias.  Neurological: Positive for extremity weakness (in legs). Negative for dizziness and headaches.  Hematological: Negative.   Psychiatric/Behavioral: Negative for sleep disturbance.     PAST MEDICAL/SURGICAL HISTORY:  Past Medical History:  Diagnosis Date  . Anemia in chronic kidney disease (CODE) 06/05/2015  . Arthritis    "bad in my legs" (12/07/2014)  . Atrial fibrillation ([place]) 06/05/2015  . Chronic back pain    "dr said my spine is crooked"  . Chronic bronchitis ([place])    "get it q yr" (12/07/2014)  . CKD stage 4 due to type 2 diabetes mellitus ([place]) 06/05/2015  . COPD  (chronic obstructive pulmonary disease) ([place])   . Gait difficulty 09/02/2014  . Heart disease   . Hypercholesterolemia   . Hypertension   . Neuropathy 2015   in legs  . Stroke ([place]) 05/2014   denies residual on 12/07/2014  . Stroke with cerebral ischemia ([place])   . Type II diabetes mellitus ([place])    Past Surgical History:  Procedure Laterality Date  . ABDOMINAL HYSTERECTOMY  ~ 1970  . APPENDECTOMY  ~ 1970  . JOINT REPLACEMENT    . TOTAL KNEE ARTHROPLASTY Right ~ 1986  . TUMOR EXCISION  ~ 1970   "9# in my stomach"     SOCIAL HISTORY:  Social History   Social History  . Marital status: Widowed    Spouse name: N/A  . Number of children: 0  . Years of education: 9   Occupational History  . retired    Social History Main Topics  . Smoking status: Former Smoker    Packs/day: 0.50    Years: 50.00    Types: Cigarettes    Start date: 03/07/1953    Quit date: 01/14/2004  . Smokeless tobacco: Never Used     Comment: "quit smoking cigarettes  in ~ 2005"  . Alcohol use No     Comment: 10/242016 "quit drinking beer in ~ 1977"  . Drug use: No  . Sexual activity: No   Other Topics Concern  . Not on file   Social History Narrative   Patient drinks caffeine a few times a week.   Patient is right handed.  FAMILY HISTORY:  Family History  Problem Relation Age of Onset  . Cancer Other   . Heart attack Brother   . Cancer Sister        breast  . Alcohol abuse Brother     CURRENT MEDICATIONS:  Outpatient Encounter Prescriptions as of 12/01/2016  Medication Sig  . albuterol (PROVENTIL HFA;VENTOLIN HFA) 108 (90 BASE) MCG/ACT inhaler Inhale 1-2 puffs into the lungs every 6 (six) hours as needed for wheezing or shortness of breath. Reported on 03/01/2015  . amLODipine (NORVASC) 10 MG tablet Take 1 tablet (10 mg total) by mouth daily.  [name] aspirin EC 81 MG tablet Take 81 mg by mouth daily.  [name] atorvastatin (LIPITOR) 40 MG tablet Take 1 tablet by mouth daily.  . B-D UF III MINI  PEN NEEDLES 31G X 5 MM MISC   . ELIQUIS 2.5 MG TABS tablet Take 1 tablet (2.5 mg total) by mouth 2 (two) times daily.  . fluticasone-salmeterol (ADVAIR HFA) 230-21 MCG/ACT inhaler Inhale 2 puffs into the lungs 2 (two) times daily.  . furosemide (LASIX) 40 MG tablet Take 1 tablet (40 mg total) by mouth daily.  [name] gabapentin (NEURONTIN) 300 MG capsule Take 1 capsule by mouth daily.  . hydrALAZINE (APRESOLINE) 100 MG tablet Take 1 tablet (100 mg total) by mouth 3 (three) times daily.  . Insulin Degludec (TRESIBA FLEXTOUCH) 100 UNIT/ML SOPN Inject 14 Units into the skin daily.   . metoprolol tartrate (LOPRESSOR) 25 MG tablet Take 0.5 tablets (12.5 mg total) by mouth 2 (two) times daily.  . ONE TOUCH ULTRA TEST test strip   . predniSONE (DELTASONE) 10 MG tablet Take 4 tablets (40 mg total) by mouth daily with breakfast. And decrease by one tablet daily  . sodium bicarbonate 650 MG tablet Take 1 tablet (650 mg total) by mouth 2 (two) times daily.  . [EXPIRED] Darbepoetin Alfa (ARANESP) injection 500 mcg    No facility-administered encounter medications on file as of 12/01/2016.     ALLERGIES:  Allergies  Allergen Reactions  . Codeine Nausea And Vomiting     PHYSICAL EXAM:  ECOG Performance status: 2-3 - Symptomatic; requires assistance.   Vitals:   12/01/16 1029  BP: (!) 145/113  Pulse: (!) 58  Resp: 18  SpO2: 100%   There were no vitals filed for this visit.  Physical Exam  Constitutional: She is oriented to person, place, and time and well-developed, well-nourished, and in no distress. No distress.  Examined seated in wheelchair   HENT:  Head: Normocephalic and atraumatic.  Mouth/Throat: Oropharynx is clear and moist. No oropharyngeal exudate.  Eyes: Pupils are equal, round, and reactive to light. Conjunctivae are normal. No scleral icterus.  Neck: Normal range of motion. Neck supple. No JVD present.  Cardiovascular: Normal rate, regular rhythm and normal heart sounds.  Exam  reveals no gallop and no friction rub.   No murmur heard. Bradycardia   Pulmonary/Chest: Effort normal and breath sounds normal. No respiratory distress. She has no wheezes. She has no rales.  Abdominal: Soft. Bowel sounds are normal. She exhibits no distension. There is no tenderness. There is no rebound and no guarding.  Musculoskeletal: She exhibits no edema or tenderness.  Lymphadenopathy:    She has no cervical adenopathy.       Right: No supraclavicular adenopathy present.       Left: No supraclavicular adenopathy present.  Neurological: She is alert and oriented to person, place, and time. No cranial nerve deficit.  Skin: Skin  is warm and dry. No rash noted. No erythema. No pallor.  Psychiatric: Mood, memory, affect and judgment normal.  Nursing note and vitals reviewed.    LABORATORY DATA:  I have reviewed the labs as listed.  CBC    Component Value Date/Time   WBC 4.7 12/01/2016 0954   RBC 2.95 (L) 12/01/2016 0954   HGB 9.1 (L) 12/01/2016 0954   HCT 28.6 (L) 12/01/2016 0954   PLT 145 (L) 12/01/2016 0954   MCV 96.9 12/01/2016 0954   MCH 30.8 12/01/2016 0954   MCHC 31.8 12/01/2016 0954   RDW 16.5 (H) 12/01/2016 0954   LYMPHSABS 0.7 12/01/2016 0954   MONOABS 0.4 12/01/2016 0954   EOSABS 0.2 12/01/2016 0954   BASOSABS 0.0 12/01/2016 0954   CMP Latest Ref Rng & Units 10/18/2016 10/17/2016 10/17/2016  Glucose 65 - 99 mg/dL 169(H) 231(H) 202(H)  BUN 6 - 20 mg/dL 72(H) 76(H) 77(H)  Creatinine 0.44 - 1.00 mg/dL 2.56(H) 2.93(H) 2.92(H)  Sodium 135 - 145 mmol/L 139 139 140  Potassium 3.5 - 5.1 mmol/L 3.4(L) 4.8 5.3(H)  Chloride 101 - 111 mmol/L 110 112(H) 114(H)  CO2 22 - 32 mmol/L 21(L) 16(L) 18(L)  Calcium 8.9 - 10.3 mg/dL 8.3(L) 8.8(L) 8.8(L)  Total Protein 6.5 - 8.1 g/dL - - -  Total Bilirubin 0.3 - 1.2 mg/dL - - -  Alkaline Phos 38 - 126 U/L - - -  AST 15 - 41 U/L - - -  ALT 14 - 54 U/L - - -   Results for JAHYRA, SUKUP (MRN [id number])   Ref. Range 04/25/2016 12:00    Iron Latest Ref Range: 28 - 170 ug/dL 56  UIBC Latest Units: ug/dL 281  TIBC Latest Ref Range: 250 - 450 ug/dL 337  Saturation Ratios Latest Ref Range: 10.4 - 31.8 % 17  Ferritin Latest Ref Range: 11 - 307 ng/mL 84   Results for JALEEYA, MCNELLY (MRN [id number])   Ref. Range 04/25/2016 12:01  Folate Latest Ref Range: >5.9 ng/mL 16.2   Results for JAVON, HUPFER (MRN [id number])   Ref. Range 04/25/2016 12:00  CRP Latest Ref Range: <1.0 mg/dL <0.8  Vitamin B12 Latest Ref Range: 180 - 914 pg/mL 441   Results for ROZELLA, SERVELLO (MRN [id number])  Ref. Range 04/25/2016 12:00  Basophils Absolute Latest Ref Range: 0.0 - 0.1 K/uL 0.0  RBC Morphology Unknown SLIGHT ANISOCYTOSIS  RBC. Latest Ref Range: 3.87 - 5.11 MIL/uL 2.79 (L)  Retic Ct Pct Latest Ref Range: 0.4 - 3.1 % 3.9 (H)  Retic Count, Manual Latest Ref Range: 19.0 - 186.0 K/uL 108.8   Results for AADHYA, BUSTAMANTE (MRN [id number])  Ref. Range 04/25/2016 12:00  Haptoglobin Latest Ref Range: 34 - 200 mg/dL 147  Erythropoietin Latest Ref Range: 2.6 - 18.5 mIU/mL 32.5 (H)  Sed Rate Latest Ref Range: 0 - 22 mm/hr 124 (H)   Results for DEYJAH, KINDEL (MRN [id number])  Ref. Range 04/25/2016 12:00  Hgb A Latest Ref Range: 96.4 - 98.8 % 97.8  Hgb A2 Quant Latest Ref Range: 1.8 - 3.2 % 2.2  Hgb F Quant Latest Ref Range: 0.0 - 2.0 % 0.0  Hgb S Quant Latest Ref Range: 0.0 % 0.0  HGB C Latest Ref Range: 0.0 % 0.0  HGB VARIANT Latest Ref Range: 0.0 % 0.0   Results for SAQUOIA, SIANEZ (MRN [id number])  Ref. Range 04/25/2016 12:01  Total Protein ELP Latest Ref Range: 6.0 - 8.5 g/dL 7.2  Albumin ELP Latest Ref Range: 2.9 - 4.4  g/dL 3.3  Globulin, Total Latest Ref Range: 2.2 - 3.9 g/dL 3.9  A/G Ratio Latest Ref Range: 0.7 - 1.7  0.8  Alpha-1-Globulin Latest Ref Range: 0.0 - 0.4 g/dL 0.2  Alpha-2-Globulin Latest Ref Range: 0.4 - 1.0 g/dL 0.8  Beta Globulin Latest Ref Range: 0.7 - 1.3 g/dL 1.0  Gamma Globulin Latest Ref Range: 0.4 - 1.8 g/dL  1.9 (H)  M-SPIKE, % Latest Ref Range: Not Observed g/dL Not Observed         ASSESSMENT & PLAN:   Iron deficiency anemia:  Ferritin levels have been steadily above 100.   Anemia due to chronic kidney disease:  -Continue Aranesp injections every 2 weeks for hemoglobin 11 g/dL or less. Hemoglobin 9.1 g/dL today. Will increase her aranesp dose to 500 mcg q14 days to get a better response.   RTC in 3 months for follow up with labs.  Orders Placed This Encounter  Procedures  . CBC with Differential    Standing Status:   Future    Standing Expiration Date:   12/01/2017  . Comprehensive metabolic panel    Standing Status:   Future    Standing Expiration Date:   12/01/2017  . Iron and TIBC    Standing Status:   Future    Standing Expiration Date:   12/01/2017  . Ferritin    Standing Status:   Future    Standing Expiration Date:   12/01/2017  . [place] [facility] COMMUNICATION LAB    Lab appointment 10 min.  [name] SCHEDULING COMMUNICATION INJECTION    Schedule injection appointment 15 min  . ARANESP TREATMENT CONDITION    Hold Aranesp: Renal hold for Hemoglobin greater than 11     All questions were answered to patient's stated satisfaction. Encouraged patient to call with any new concerns or questions before her next visit to the cancer center and we can certain see her sooner, if needed.     Twana First, MD

## 2016-12-06 ENCOUNTER — Ambulatory Visit (HOSPITAL_COMMUNITY): Payer: Medicare Other

## 2016-12-06 ENCOUNTER — Encounter (HOSPITAL_COMMUNITY): Payer: Self-pay

## 2016-12-06 DIAGNOSIS — Z7409 Other reduced mobility: Secondary | ICD-10-CM

## 2016-12-06 DIAGNOSIS — R2681 Unsteadiness on feet: Secondary | ICD-10-CM | POA: Diagnosis not present

## 2016-12-06 DIAGNOSIS — M6281 Muscle weakness (generalized): Secondary | ICD-10-CM

## 2016-12-06 NOTE — Therapy (Signed)
[place], [place], [address] Phone: [phone]   Fax:  [phone]  Physical Therapy Treatment  Patient Details  [name] MRN: [id number] Date of Birth: [birth year] Referring Provider: Monico Blitz MD  Encounter Date: 12/06/2016      PT End of Session - 12/06/16 1124    Visit Number 2   Number of Visits 9   Date for PT Re-Evaluation 12/22/16   Authorization Type UHC Medicare   Authorization Time Period 12/01/2016 - 12/30/2016   Authorization - Visit Number 2   Authorization - Number of Visits 10   PT Start Time 1120   PT Stop Time 1158   PT Time Calculation (min) 38 min   Equipment Utilized During Treatment Gait belt  FWW   Activity Tolerance Patient tolerated treatment well;No increased pain;Patient limited by fatigue   Behavior During Therapy [facility] for tasks assessed/performed      Past Medical History:  Diagnosis Date  . Anemia in chronic kidney disease (CODE) 06/05/2015  . Arthritis    "bad in my legs" (12/07/2014)  . Atrial fibrillation ([place]) 06/05/2015  . Chronic back pain    "dr said my spine is crooked"  . Chronic bronchitis ([place])    "get it q yr" (12/07/2014)  . CKD stage 4 due to type 2 diabetes mellitus ([place]) 06/05/2015  . COPD (chronic obstructive pulmonary disease) ([place])   . Gait difficulty 09/02/2014  . Heart disease   . Hypercholesterolemia   . Hypertension   . Neuropathy 2015   in legs  . Stroke ([place]) 05/2014   denies residual on 12/07/2014  . Stroke with cerebral ischemia ([place])   . Type II diabetes mellitus ([place])     Past Surgical History:  Procedure Laterality Date  . ABDOMINAL HYSTERECTOMY  ~ 1970  . APPENDECTOMY  ~ 1970  . JOINT REPLACEMENT    . TOTAL KNEE ARTHROPLASTY Right ~ 1986  . TUMOR EXCISION  ~ 1970   "9# in my stomach"    There were no vitals filed for this visit.      Subjective Assessment - 12/06/16 1116    Subjective Pt stated she is moving  slow today.  Reports legs are a little stiff felt due to the weather and arthritis.  No reports of recent falls or LOB.  Does have some SOB while walking.     Pertinent History COPD, hx heart attack, CHF, arthritis, hx stroke 2016 affect just her speech, bilateral neuropathy    Currently in Pain? No/denies            [facility] PT Assessment - 12/06/16 0001      Assessment   Medical Diagnosis Unsteady Gait   Referring Provider Monico Blitz MD   Onset Date/Surgical Date --  3 years   Next MD Visit 12/08/2016   Prior Therapy yes                     [place] Adult PT Treatment/Exercise - 12/06/16 0001      Knee/Hip Exercises: Seated   Long Arc Quad 10 reps   Long Arc Quad Limitations alternating   Knee/Hip Flexion Glute set x 10   Marching Both;10 reps  alternating, HHA and cueing for sitting posture with Rt LE   Marching Limitations UE assistance with Rt LE march; completed alternating   Hamstring Curl 10 reps   Hamstring Limitations RTB   Abduction/Adduction  10 reps   Abd/Adduction Limitations  RTB   Sit to Sand 5 reps;without UE support  elevated seat height with foam             Balance Exercises - 12/06/16 1157      Balance Exercises: Standing   Standing Eyes Opened Narrow base of support (BOS);Foam/compliant surface  2 sets 30" holds; 1 set with 10 UE flexion NBOS on foam   Tandem Stance Intermittent upper extremity support;3 reps;15 secs  partial tandem stance on solid ground           PT Education - 12/06/16 1129    Education provided Yes   Education Details Reviewed goals, assured compliance with HEP, copy of eval given to pt   Person(s) Educated Patient   Methods Explanation;Demonstration;Handout   Comprehension Verbalized understanding;Returned demonstration;Need further instruction          PT Short Term Goals - 12/01/16 1527      PT SHORT TERM GOAL #1   Title Patient will be independent and compliant with HEP to improve overall  endurance and quality ambulation.    Time 2   Period Weeks   Status New     PT SHORT TERM GOAL #2   Title Patient will have improved MMT grade by 1/2 grade at least to improve overall functional strength.    Time 2   Period Weeks   Status New     PT SHORT TERM GOAL #3   Title Pt will ambulate >320 ft. during 3MWT to indicate improved endurance with FWW or at least 268 ft. with LRAD.    Time 3   Period Weeks   Status New           PT Long Term Goals - 12/01/16 1529      PT LONG TERM GOAL #1   Title Patient will have improved MMT grade by at least 1 full grade to improve overall functional strength.    Time 4   Period Weeks   Status New     PT LONG TERM GOAL #2   Title Pt will ambulate at least 400 ft with FWW in 3 MWT or with a LRAD.    Time 4   Period Weeks   Status New     PT LONG TERM GOAL #3   Title Pt will show imprved endurance with 5xSTS by completing in < 30 seconds.    Time 4   Period Weeks   Status New     PT LONG TERM GOAL #4   Title Pt will have improved gait speed with TUG to demonstrate improved safety to <18 seconds with FWW or LRAD.    Time 4   Period Weeks   Status New     PT LONG TERM GOAL #5   Title Pt will have improved FOTO outcome measure score by 10%.    Time 4   Period Weeks   Status New               Plan - 12/06/16 1158    Clinical Impression Statement Reviewed goals, assured compliance with HEP and copy of eval given to pt.  Session focus on [place] hip/LE strengthening exercises with additional theraband resistance for strengthening and static balance activities.  Pt limited by fatigue iwth tasks wiht 2 seated rest breaks. O2 sat at 99%.     Rehab Potential Fair   Clinical Impairments Affecting Rehab Potential (-) significant medical history, previous PT for current issue 3 yr ago; (+) motivated to ambulate  with LRAD   PT Frequency 2x / week   PT Duration 4 weeks   PT Treatment/Interventions ADLs/Self Care Home  Management;Stair training;Functional mobility training;Therapeutic activities;Therapeutic exercise;Balance training;Neuromuscular re-education;Gait training;DME Instruction;Patient/family education;Manual techniques   PT Next Visit Plan dynamic balance exercises, seated hip strengthening exercises.  Add STS to HEP next session.   PT Home Exercise Plan Eval: chair exercises glute set x 10, knee extension 2x5, marching 2x5, abduction 2x5      Patient will benefit from skilled therapeutic intervention in order to improve the following deficits and impairments:  Abnormal gait, Decreased balance, Decreased activity tolerance, Cardiopulmonary status limiting activity, Decreased endurance, Decreased strength, Difficulty walking, Obesity, Postural dysfunction, Improper body mechanics  Visit Diagnosis: Unsteady gait  Muscle weakness (generalized)  Decreased functional mobility and endurance     Problem List Patient Active Problem List   Diagnosis Date Noted  . Chronic diastolic CHF (congestive heart failure) ([place]) 10/15/2016  . Elevated troponin 10/15/2016  . Type 2 diabetes mellitus with nephropathy ([place]) 10/15/2016  . Acute renal failure superimposed on stage 4 chronic kidney disease ([place]) 10/15/2016  . NSTEMI (non-ST elevated myocardial infarction) ([place]) 10/03/2015  . Elevated LFTs 10/03/2015  . Encounter for therapeutic drug monitoring 07/29/2015  . COPD with acute exacerbation ([place])   . COPD (chronic obstructive pulmonary disease) ([place]) 06/05/2015  . CKD stage 4 due to type 2 diabetes mellitus ([place]) 06/05/2015  . Anemia in chronic kidney disease, Stage IV 06/05/2015  . Acute on chronic diastolic CHF (congestive heart failure) ([place]) 06/05/2015  . Atrial fibrillation ([place]) 06/05/2015  . CHF exacerbation ([place]) 06/05/2015  . Atrial fibrillation with RVR ([place]) 12/08/2014  . Atrial fibrillation with rapid ventricular response ([place]) 12/07/2014  . TIA (transient ischemic attack) 09/02/2014   . Gait difficulty 09/02/2014  . Other specified transient cerebral ischemias   . Stroke with cerebral ischemia ([place])   . Hemispheric carotid artery syndrome   . Essential hypertension   . Hyperlipidemia   . Type 2 diabetes mellitus with other circulatory complications ([place])   . CVA (cerebral infarction) 05/27/2014   Ihor Austin, [place]; [place]  Aldona Lento 12/06/2016, 12:03 PM  [place], [place], [address] Phone: [phone]   Fax:  [phone]  Name: Tequita Marrs MRN: [id number] Date of Birth: [birth year]

## 2016-12-12 ENCOUNTER — Telehealth (HOSPITAL_COMMUNITY): Payer: Self-pay

## 2016-12-12 ENCOUNTER — Ambulatory Visit (HOSPITAL_COMMUNITY): Payer: Medicare Other | Admitting: Physical Therapy

## 2016-12-12 DIAGNOSIS — M6281 Muscle weakness (generalized): Secondary | ICD-10-CM

## 2016-12-12 DIAGNOSIS — R2681 Unsteadiness on feet: Secondary | ICD-10-CM

## 2016-12-12 DIAGNOSIS — Z7409 Other reduced mobility: Secondary | ICD-10-CM

## 2016-12-12 NOTE — Telephone Encounter (Signed)
Patient had to cancel her appt ,she have an appt in [place] on the same day.

## 2016-12-12 NOTE — Therapy (Signed)
Jody [name], [place], [address] Phone: [phone]   Fax:  [phone]  Physical Therapy Treatment  Patient Details  [name] MRN: [id number] Date of Birth: [birth year] Referring Provider: Monico Blitz MD  Encounter Date: 12/12/2016      PT End of Session - 12/12/16 1113    Visit Number 3   Number of Visits 9   Date for PT Re-Evaluation 12/22/16   Authorization Type UHC Medicare   Authorization Time Period 12/01/2016 - 12/30/2016   Authorization - Visit Number 3   Authorization - Number of Visits 10   PT Start Time 5643   PT Stop Time 1115   PT Time Calculation (min) 43 min   Equipment Utilized During Treatment Gait belt  FWW   Activity Tolerance Patient tolerated treatment well;No increased pain;Patient limited by fatigue   Behavior During Therapy [facility] for tasks assessed/performed      Past Medical History:  Diagnosis Date  . Anemia in chronic kidney disease (CODE) 06/05/2015  . Arthritis    "bad in my legs" (12/07/2014)  . Atrial fibrillation ([place]) 06/05/2015  . Chronic back pain    "dr said my spine is crooked"  . Chronic bronchitis ([place])    "get it q yr" (12/07/2014)  . CKD stage 4 due to type 2 diabetes mellitus ([place]) 06/05/2015  . COPD (chronic obstructive pulmonary disease) ([place])   . Gait difficulty 09/02/2014  . Heart disease   . Hypercholesterolemia   . Hypertension   . Neuropathy 2015   in legs  . Stroke ([place]) 05/2014   denies residual on 12/07/2014  . Stroke with cerebral ischemia ([place])   . Type II diabetes mellitus ([place])     Past Surgical History:  Procedure Laterality Date  . ABDOMINAL HYSTERECTOMY  ~ 1970  . APPENDECTOMY  ~ 1970  . JOINT REPLACEMENT    . TOTAL KNEE ARTHROPLASTY Right ~ 1986  . TUMOR EXCISION  ~ 1970   "9# in my stomach"    There were no vitals filed for this visit.      Subjective Assessment - 12/12/16 1039    Subjective Pt states she is doing  well today.  States she wish she could walk without the walker.    Currently in Pain? No/denies                         [facility] Adult PT Treatment/Exercise - 12/12/16 0001      Knee/Hip Exercises: Standing   Knee Flexion Left;10 reps   Knee Flexion Limitations with manual assist for posture   Hip Flexion 10 reps   Hip Flexion Limitations high march, alternating   Hip Abduction Both;10 reps   Hip Extension Both;10 reps   SLS with Vectors 3X3" each with Bil UE assist     Knee/Hip Exercises: Seated   Long Arc Quad 15 reps   Sit to [employer] 5 reps;without UE support;2 sets             Balance Exercises - 12/12/16 1054      Balance Exercises: Standing   Standing Eyes Opened Narrow base of support (BOS);Foam/compliant surface  2 30" holds without UE assist, 10X UE flex, 10X UE abd   Tandem Stance Intermittent upper extremity support;Eyes open;1 rep;30 secs   Sidestepping 2 reps  in parallel bars without UE assist           PT Education - 12/12/16  1159    Education provided Yes   Education Details pacing self.  Complete therex slowly, controlled.  Increase postural awareness   Person(s) Educated Patient   Methods Explanation   Comprehension Verbalized understanding          PT Short Term Goals - 12/01/16 1527      PT SHORT TERM GOAL #1   Title Patient will be independent and compliant with HEP to improve overall endurance and quality ambulation.    Time 2   Period Weeks   Status New     PT SHORT TERM GOAL #2   Title Patient will have improved MMT grade by 1/2 grade at least to improve overall functional strength.    Time 2   Period Weeks   Status New     PT SHORT TERM GOAL #3   Title Pt will ambulate >320 ft. during 3MWT to indicate improved endurance with FWW or at least 268 ft. with LRAD.    Time 3   Period Weeks   Status New           PT Long Term Goals - 12/01/16 1529      PT LONG TERM GOAL #1   Title Patient will have improved  MMT grade by at least 1 full grade to improve overall functional strength.    Time 4   Period Weeks   Status New     PT LONG TERM GOAL #2   Title Pt will ambulate at least 400 ft with FWW in 3 MWT or with a LRAD.    Time 4   Period Weeks   Status New     PT LONG TERM GOAL #3   Title Pt will show imprved endurance with 5xSTS by completing in < 30 seconds.    Time 4   Period Weeks   Status New     PT LONG TERM GOAL #4   Title Pt will have improved gait speed with TUG to demonstrate improved safety to <18 seconds with FWW or LRAD.    Time 4   Period Weeks   Status New     PT LONG TERM GOAL #5   Title Pt will have improved FOTO outcome measure score by 10%.    Time 4   Period Weeks   Status New               Plan - 12/12/16 1152    Clinical Impression Statement Progressed with standing strengthening exercises.  Pt needs max encouragement to challenge balance without using UE's.  Pt is very fearful and not confident in ability to maintain balance.  Pt requires frequent rest breaks to complete all tasks.  Added sidestepping in parallel bars without UE assist with cues to lift LE's (not drag) and take larger steps.  Frequent postural cues as with poor core stability.  Pt overall with good performance.  Did c/o mild Lt knee pain/acheing at end of session.   Rehab Potential Fair   Clinical Impairments Affecting Rehab Potential (-) significant medical history, previous PT for current issue 3 yr ago; (+) motivated to ambulate with LRAD   PT Frequency 2x / week   PT Duration 4 weeks   PT Treatment/Interventions ADLs/Self Care Home Management;Stair training;Functional mobility training;Therapeutic activities;Therapeutic exercise;Balance training;Neuromuscular re-education;Gait training;DME Instruction;Patient/family education;Manual techniques   PT Next Visit Plan dynamic balance exercises, seated hip strengthening exercises.  Add STS to HEP next session (forgot this session).   Continue to progress towards goals and  decrease dependence on UE's.   PT Home Exercise Plan Eval: chair exercises glute set x 10, knee extension 2x5, marching 2x5, abduction 2x5      Patient will benefit from skilled therapeutic intervention in order to improve the following deficits and impairments:  Abnormal gait, Decreased balance, Decreased activity tolerance, Cardiopulmonary status limiting activity, Decreased endurance, Decreased strength, Difficulty walking, Obesity, Postural dysfunction, Improper body mechanics  Visit Diagnosis: Unsteady gait  Muscle weakness (generalized)  Decreased functional mobility and endurance     Problem List Patient Active Problem List   Diagnosis Date Noted  . Chronic diastolic CHF (congestive heart failure) ([place]) 10/15/2016  . Elevated troponin 10/15/2016  . Type 2 diabetes mellitus with nephropathy ([place]) 10/15/2016  . Acute renal failure superimposed on stage 4 chronic kidney disease ([place]) 10/15/2016  . NSTEMI (non-ST elevated myocardial infarction) ([place]) 10/03/2015  . Elevated LFTs 10/03/2015  . Encounter for therapeutic drug monitoring 07/29/2015  . COPD with acute exacerbation ([place])   . COPD (chronic obstructive pulmonary disease) ([place]) 06/05/2015  . CKD stage 4 due to type 2 diabetes mellitus ([place]) 06/05/2015  . Anemia in chronic kidney disease, Stage IV 06/05/2015  . Acute on chronic diastolic CHF (congestive heart failure) ([place]) 06/05/2015  . Atrial fibrillation ([place]) 06/05/2015  . CHF exacerbation ([place]) 06/05/2015  . Atrial fibrillation with RVR ([place]) 12/08/2014  . Atrial fibrillation with rapid ventricular response ([place]) 12/07/2014  . TIA (transient ischemic attack) 09/02/2014  . Gait difficulty 09/02/2014  . Other specified transient cerebral ischemias   . Stroke with cerebral ischemia ([place])   . Hemispheric carotid artery syndrome   . Essential hypertension   . Hyperlipidemia   . Type 2 diabetes mellitus with other circulatory  complications ([place])   . CVA (cerebral infarction) 05/27/2014   Teena Irani, PTA/CLT [phone]  Teena Irani 12/12/2016, 12:00 PM  [place], [place], [address] Phone: [phone]   Fax:  [phone]  [name] MRN: [id number] Date of Birth: [birth year]

## 2016-12-15 ENCOUNTER — Encounter (HOSPITAL_COMMUNITY): Payer: Self-pay

## 2016-12-15 ENCOUNTER — Encounter (HOSPITAL_BASED_OUTPATIENT_CLINIC_OR_DEPARTMENT_OTHER): Payer: Medicare Other

## 2016-12-15 ENCOUNTER — Ambulatory Visit (HOSPITAL_COMMUNITY): Payer: Medicare Other | Attending: Internal Medicine

## 2016-12-15 ENCOUNTER — Encounter (HOSPITAL_COMMUNITY): Payer: Medicare Other | Attending: Oncology

## 2016-12-15 VITALS — BP 162/71 | HR 56 | Temp 98.6°F | Resp 16

## 2016-12-15 DIAGNOSIS — D631 Anemia in chronic kidney disease: Secondary | ICD-10-CM | POA: Insufficient documentation

## 2016-12-15 DIAGNOSIS — N189 Chronic kidney disease, unspecified: Secondary | ICD-10-CM

## 2016-12-15 DIAGNOSIS — Z7409 Other reduced mobility: Secondary | ICD-10-CM | POA: Insufficient documentation

## 2016-12-15 DIAGNOSIS — M6281 Muscle weakness (generalized): Secondary | ICD-10-CM | POA: Insufficient documentation

## 2016-12-15 DIAGNOSIS — R2681 Unsteadiness on feet: Secondary | ICD-10-CM | POA: Insufficient documentation

## 2016-12-15 DIAGNOSIS — D509 Iron deficiency anemia, unspecified: Secondary | ICD-10-CM

## 2016-12-15 LAB — CBC WITH DIFFERENTIAL/PLATELET
Basophils Absolute: 0 10*3/uL (ref 0.0–0.1)
Basophils Relative: 0 %
Eosinophils Absolute: 0.3 10*3/uL (ref 0.0–0.7)
Eosinophils Relative: 5 %
HEMATOCRIT: 31.4 % — AB (ref 36.0–46.0)
HEMOGLOBIN: 9.7 g/dL — AB (ref 12.0–15.0)
LYMPHS ABS: 0.9 10*3/uL (ref 0.7–4.0)
LYMPHS PCT: 15 %
MCH: 30.3 pg (ref 26.0–34.0)
MCHC: 30.9 g/dL (ref 30.0–36.0)
MCV: 98.1 fL (ref 78.0–100.0)
MONOS PCT: 6 %
Monocytes Absolute: 0.4 10*3/uL (ref 0.1–1.0)
NEUTROS ABS: 4.4 10*3/uL (ref 1.7–7.7)
NEUTROS PCT: 74 %
Platelets: 181 10*3/uL (ref 150–400)
RBC: 3.2 MIL/uL — AB (ref 3.87–5.11)
RDW: 17 % — ABNORMAL HIGH (ref 11.5–15.5)
WBC: 6 10*3/uL (ref 4.0–10.5)

## 2016-12-15 LAB — FERRITIN: Ferritin: 136 ng/mL (ref 11–307)

## 2016-12-15 MED ORDER — DARBEPOETIN ALFA 500 MCG/ML IJ SOSY
500.0000 ug | PREFILLED_SYRINGE | Freq: Once | INTRAMUSCULAR | Status: AC
  Administered 2016-12-15: 500 ug via SUBCUTANEOUS

## 2016-12-15 MED ORDER — DARBEPOETIN ALFA 500 MCG/ML IJ SOSY
PREFILLED_SYRINGE | INTRAMUSCULAR | Status: AC
  Filled 2016-12-15: qty 1

## 2016-12-15 NOTE — Patient Instructions (Signed)
[facility] Discharge Instructions  RECOMMENDATIONS MADE BY THE CONSULTANT AND ANY TEST RESULTS WILL BE SENT TO YOUR REFERRING PHYSICIAN.  Aranesp given today  Follow up as scheduled.  Thank you for choosing  Cancer Center at [facility] to provide your oncology and hematology care.  To afford each patient quality time with our provider, please arrive at least 15 minutes before your scheduled appointment time.    If you have a lab appointment with the Cancer Center please come in thru the  Main Entrance and check in at the main information desk  You need to re-schedule your appointment should you arrive 10 or more minutes late.  We strive to give you quality time with our providers, and arriving late affects you and other patients whose appointments are after yours.  Also, if you no show three or more times for appointments you may be dismissed from the clinic at the providers discretion.     Again, thank you for choosing [facility].  Our hope is that these requests will decrease the amount of time that you wait before being seen by our physicians.       _____________________________________________________________  Should you have questions after your visit to [facility], please contact our office at [phone] between the hours of 8:30 a.m. and 4:30 p.m.  Voicemails left after 4:30 p.m. will not be returned until the following business day.  For prescription refill requests, have your pharmacy contact our office.       Resources For Cancer Patients and their Caregivers ? American Cancer Society: Can assist with transportation, wigs, general needs, runs Look Good Feel Better.        1-888-227-6333 ? Cancer Care: Provides financial assistance, online support groups, medication/co-pay assistance.  1-800-813-HOPE (4673) ? [facility] Assists [place] cancer patients and  their families through emotional , educational and financial support.  336-427-4357 ? [place] DSS Where to apply for food stamps, Medicaid and utility assistance. 336-342-1394 ? [facility]: Transportation to medical appointments. 336-347-2287 ? Social Security Administration: May apply for disability if have a Stage IV cancer. 336-342-7796 1-800-772-1213 ? [place] Aging, Disability and Transit Services: Assists with nutrition, care and transit needs. 336-349-2343  Cancer Center Support Programs: @10RELATIVEDAYS@ > Cancer Support Group  2nd Tuesday of the month 1pm-2pm, Journey Room  > Creative Journey  3rd Tuesday of the month 1130am-1pm, Journey Room  > Look Good Feel Better  1st Wednesday of the month 10am-12 noon, Journey Room (Call American Cancer Society to register 1-800-395-5775)   

## 2016-12-15 NOTE — Therapy (Signed)
[place], [place], [address] Phone: [phone]   Fax:  [phone]  Physical Therapy Treatment  Patient Details  [name] MRN: [id number] Date of Birth: December 08, 1940 Referring Provider: Monico Blitz MD  Encounter Date: 12/15/2016      PT End of Session - 12/15/16 1114    Visit Number 4   Number of Visits 9   Date for PT Re-Evaluation 12/22/16   Authorization Type UHC Medicare   Authorization Time Period 12/01/2016 - 12/30/2016   Authorization - Visit Number 4   Authorization - Number of Visits 10   PT Start Time 8527   PT Stop Time 1110  pt wanted to leave early due to other appointment   PT Time Calculation (min) 38 min   Equipment Utilized During Treatment Gait belt  FWW   Activity Tolerance Patient tolerated treatment well;No increased pain;Patient limited by fatigue   Behavior During Therapy [facility] for tasks assessed/performed      Past Medical History:  Diagnosis Date  . Anemia in chronic kidney disease (CODE) 06/05/2015  . Arthritis    "bad in my legs" (12/07/2014)  . Atrial fibrillation ([place]) 06/05/2015  . Chronic back pain    "dr said my spine is crooked"  . Chronic bronchitis ([place])    "get it q yr" (12/07/2014)  . CKD stage 4 due to type 2 diabetes mellitus ([place]) 06/05/2015  . COPD (chronic obstructive pulmonary disease) ([place])   . Gait difficulty 09/02/2014  . Heart disease   . Hypercholesterolemia   . Hypertension   . Neuropathy 2015   in legs  . Stroke ([place]) 05/2014   denies residual on 12/07/2014  . Stroke with cerebral ischemia ([place])   . Type II diabetes mellitus ([place])     Past Surgical History:  Procedure Laterality Date  . ABDOMINAL HYSTERECTOMY  ~ 1970  . APPENDECTOMY  ~ 1970  . JOINT REPLACEMENT    . TOTAL KNEE ARTHROPLASTY Right ~ 1986  . TUMOR EXCISION  ~ 1970   "9# in my stomach"    There were no vitals filed for this visit.      Subjective Assessment -  12/15/16 1035    Subjective Pt states she isn't feeling too good today, but "its probably because of the rain."   Currently in Pain? No/denies                         [facility] Adult PT Treatment/Exercise - 12/15/16 0001      Knee/Hip Exercises: Stretches   Active Hamstring Stretch Limitations 10 reps     Knee/Hip Exercises: Standing   Knee Flexion Left;10 reps   Knee Flexion Limitations with manual assist for posture   Hip Flexion 10 reps   Hip Flexion Limitations high march, alternating   Forward Lunges 10 reps;5 seconds   Forward Lunges Limitations 6" box   Hip Abduction Both;10 reps   Hip Extension Both;10 reps   Forward Step Up Step Height: 2";10 reps   Forward Step Up Limitations fatigued after   Gait Training Forward stepping, backward 20 ft x 1 RT with 1 UE assist therapist     Knee/Hip Exercises: Seated   Long Arc Quad 15 reps   Heel Slides Limitations Seated toe tap 4" step x 10 bilateral   Knee/Hip Flexion Glute set x 15   Marching Both;10 reps   Marching Limitations no UE assist Rt.  Hamstring Curl 10 reps   Hamstring Limitations RTB   Abduction/Adduction  10 reps   Abd/Adduction Limitations RTB   Sit to Sand 5 reps;without UE support;2 sets                  PT Short Term Goals - 12/01/16 1527      PT SHORT TERM GOAL #1   Title Patient will be independent and compliant with HEP to improve overall endurance and quality ambulation.    Time 2   Period Weeks   Status New     PT SHORT TERM GOAL #2   Title Patient will have improved MMT grade by 1/2 grade at least to improve overall functional strength.    Time 2   Period Weeks   Status New     PT SHORT TERM GOAL #3   Title Pt will ambulate >320 ft. during 3MWT to indicate improved endurance with FWW or at least 268 ft. with LRAD.    Time 3   Period Weeks   Status New           PT Long Term Goals - 12/01/16 1529      PT LONG TERM GOAL #1   Title Patient will have improved  MMT grade by at least 1 full grade to improve overall functional strength.    Time 4   Period Weeks   Status New     PT LONG TERM GOAL #2   Title Pt will ambulate at least 400 ft with FWW in 3 MWT or with a LRAD.    Time 4   Period Weeks   Status New     PT LONG TERM GOAL #3   Title Pt will show imprved endurance with 5xSTS by completing in < 30 seconds.    Time 4   Period Weeks   Status New     PT LONG TERM GOAL #4   Title Pt will have improved gait speed with TUG to demonstrate improved safety to <18 seconds with FWW or LRAD.    Time 4   Period Weeks   Status New     PT LONG TERM GOAL #5   Title Pt will have improved FOTO outcome measure score by 10%.    Time 4   Period Weeks   Status New               Plan - 12/15/16 1114    Clinical Impression Statement Continued to progress standing exercises and work on ambulation outside parallel bars to challenge balance and decreased bilateral UE reliance. Patient presented with short stride lengths, however, good foot clearnace throughout. Patient continues to have poor endurance and postural awarness throughout exercises. She did not that she has seen a small improvement stating that, "i notice i walk a little better". Continue PT per POC.    Rehab Potential Fair   Clinical Impairments Affecting Rehab Potential (-) significant medical history, previous PT for current issue 3 yr ago; (+) motivated to ambulate with LRAD   PT Frequency 2x / week   PT Duration 4 weeks   PT Treatment/Interventions ADLs/Self Care Home Management;Stair training;Functional mobility training;Therapeutic activities;Therapeutic exercise;Balance training;Neuromuscular re-education;Gait training;DME Instruction;Patient/family education;Manual techniques   PT Next Visit Plan dynamic balance exercises, seated hip strengthening exercises.  Continue to progress towards goals and decrease dependence on UE's.   PT Home Exercise Plan Eval: chair exercises glute  set x 10, knee extension 2x5, marching 2x5, abduction 2x5  Patient will benefit from skilled therapeutic intervention in order to improve the following deficits and impairments:  Abnormal gait, Decreased balance, Decreased activity tolerance, Cardiopulmonary status limiting activity, Decreased endurance, Decreased strength, Difficulty walking, Obesity, Postural dysfunction, Improper body mechanics  Visit Diagnosis: Unsteady gait  Muscle weakness (generalized)  Decreased functional mobility and endurance     Problem List Patient Active Problem List   Diagnosis Date Noted  . Chronic diastolic CHF (congestive heart failure) ([place]) 10/15/2016  . Elevated troponin 10/15/2016  . Type 2 diabetes mellitus with nephropathy ([place]) 10/15/2016  . Acute renal failure superimposed on stage 4 chronic kidney disease ([place]) 10/15/2016  . NSTEMI (non-ST elevated myocardial infarction) ([place]) 10/03/2015  . Elevated LFTs 10/03/2015  . Encounter for therapeutic drug monitoring 07/29/2015  . COPD with acute exacerbation ([place])   . COPD (chronic obstructive pulmonary disease) ([place]) 06/05/2015  . CKD stage 4 due to type 2 diabetes mellitus ([place]) 06/05/2015  . Anemia in chronic kidney disease, Stage IV 06/05/2015  . Acute on chronic diastolic CHF (congestive heart failure) ([place]) 06/05/2015  . Atrial fibrillation ([place]) 06/05/2015  . CHF exacerbation ([place]) 06/05/2015  . Atrial fibrillation with RVR ([place]) 12/08/2014  . Atrial fibrillation with rapid ventricular response ([place]) 12/07/2014  . TIA (transient ischemic attack) 09/02/2014  . Gait difficulty 09/02/2014  . Other specified transient cerebral ischemias   . Stroke with cerebral ischemia ([place])   . Hemispheric carotid artery syndrome   . Essential hypertension   . Hyperlipidemia   . Type 2 diabetes mellitus with other circulatory complications ([place])   . CVA (cerebral infarction) 05/27/2014   [name] PT, DPT 11:18 AM,  12/15/16 [place], [place], [address] Phone: [phone]   Fax:  [phone]  [name] MRN: [id number] Date of Birth: May 02, 1940

## 2016-12-15 NOTE — Progress Notes (Signed)
Jody [name] presents today for injection per MD orders. Aranesp 564mcg administered SQ in left Abdomen. Administration without incident. Patient tolerated well.  Treatment given per orders. Patient tolerated it well without problems. Vitals stable and discharged home from clinic ambulatory. Follow up as scheduled.

## 2016-12-20 ENCOUNTER — Encounter (HOSPITAL_COMMUNITY): Payer: Self-pay

## 2016-12-20 ENCOUNTER — Ambulatory Visit (HOSPITAL_COMMUNITY): Payer: Medicare Other

## 2016-12-20 VITALS — BP 188/91 | HR 53

## 2016-12-20 DIAGNOSIS — M6281 Muscle weakness (generalized): Secondary | ICD-10-CM

## 2016-12-20 DIAGNOSIS — Z7409 Other reduced mobility: Secondary | ICD-10-CM

## 2016-12-20 DIAGNOSIS — R2681 Unsteadiness on feet: Secondary | ICD-10-CM | POA: Diagnosis not present

## 2016-12-20 NOTE — Therapy (Signed)
[place], [place], [address] Phone: [phone]   Fax:  [phone]  Physical Therapy Treatment  Patient Details  [name] MRN: [id number] Date of Birth: [birth year] Referring Provider: Monico Blitz MD   Encounter Date: 12/20/2016  PT End of Session - 12/20/16 1129    Visit Number  5    Number of Visits  9    Date for PT Re-Evaluation  12/22/16    Authorization Type  UHC Medicare    Authorization Time Period  12/01/2016 - 12/30/2016    Authorization - Visit Number  5    Authorization - Number of Visits  10    PT Start Time  1120    PT Stop Time  1155    PT Time Calculation (min)  35 min    Equipment Utilized During Treatment  Gait belt    Activity Tolerance  Patient tolerated treatment well;Patient limited by pain;Patient limited by fatigue;Other (comment) Rt knee pain with WB, vitals   Rt knee pain with WB, vitals   Behavior During Therapy  WFL for tasks assessed/performed       Past Medical History:  Diagnosis Date  . Anemia in chronic kidney disease (CODE) 06/05/2015  . Arthritis    "bad in my legs" (12/07/2014)  . Atrial fibrillation ([place]) 06/05/2015  . Chronic back pain    "dr said my spine is crooked"  . Chronic bronchitis ([place])    "get it q yr" (12/07/2014)  . CKD stage 4 due to type 2 diabetes mellitus ([place]) 06/05/2015  . COPD (chronic obstructive pulmonary disease) ([place])   . Gait difficulty 09/02/2014  . Heart disease   . Hypercholesterolemia   . Hypertension   . Neuropathy 2015   in legs  . Stroke ([place]) 05/2014   denies residual on 12/07/2014  . Stroke with cerebral ischemia ([place])   . Type II diabetes mellitus ([place])     Past Surgical History:  Procedure Laterality Date  . ABDOMINAL HYSTERECTOMY  ~ 1970  . APPENDECTOMY  ~ 1970  . JOINT REPLACEMENT    . TOTAL KNEE ARTHROPLASTY Right ~ 1986  . TUMOR EXCISION  ~ 1970   "9# in my stomach"    Vitals:   12/20/16 1120  BP:  (!) 188/91  Pulse: (!) 53  SpO2: 98%    Subjective Assessment - 12/20/16 1120    Subjective  Pt arrived stated she has increased pain Rt knee, believes related to her knee.  Feels her hips are tight.   No reoprts of recent falls.    Pertinent History  COPD, hx heart attack, CHF, arthritis, hx stroke 2016 affect just her speech, bilateral neuropathy     Currently in Pain?  Yes    Pain Score  9     Pain Location  Knee    Pain Orientation  Right    Pain Descriptors / Indicators  Aching;Dull    Pain Type  Chronic pain    Pain Onset  More than a month ago    Pain Frequency  Intermittent    Aggravating Factors   ambulation    Pain Relieving Factors  resting                      [facility] Adult PT Treatment/Exercise - 12/20/16 0001      Knee/Hip Exercises: Stretches   Active Hamstring Stretch  2 reps;30 seconds    Active Hamstring Stretch Limitations  long sitting      Knee/Hip Exercises: Aerobic   Nustep  was going to attempt then c/o dizziness so BP taken.  Ended tx and talked to care giver about vitals      Knee/Hip Exercises: Seated   Marching  Both;10 reps    Marching Limitations  no UE assist     Hamstring Curl  2 sets;10 reps    Hamstring Limitations  RTB    Abduction/Adduction   10 reps;2 sets    Abd/Adduction Limitations  RTB    Abd/Adduction Weights  -- volley ball for adduction   volley ball for adduction   Sit to Sand  5 reps;without UE support elevated seat height with foam   elevated seat height with foam              PT Short Term Goals - 12/01/16 1527      PT SHORT TERM GOAL #1   Title  Patient will be independent and compliant with HEP to improve overall endurance and quality ambulation.     Time  2    Period  Weeks    Status  New      PT SHORT TERM GOAL #2   Title  Patient will have improved MMT grade by 1/2 grade at least to improve overall functional strength.     Time  2    Period  Weeks    Status  New      PT SHORT TERM GOAL  #3   Title  Pt will ambulate >320 ft. during 3MWT to indicate improved endurance with FWW or at least 268 ft. with LRAD.     Time  3    Period  Weeks    Status  New        PT Long Term Goals - 12/01/16 1529      PT LONG TERM GOAL #1   Title  Patient will have improved MMT grade by at least 1 full grade to improve overall functional strength.     Time  4    Period  Weeks    Status  New      PT LONG TERM GOAL #2   Title  Pt will ambulate at least 400 ft with FWW in 3 MWT or with a LRAD.     Time  4    Period  Weeks    Status  New      PT LONG TERM GOAL #3   Title  Pt will show imprved endurance with 5xSTS by completing in < 30 seconds.     Time  4    Period  Weeks    Status  New      PT LONG TERM GOAL #4   Title  Pt will have improved gait speed with TUG to demonstrate improved safety to <18 seconds with FWW or LRAD.     Time  4    Period  Weeks    Status  New      PT LONG TERM GOAL #5   Title  Pt will have improved FOTO outcome measure score by 10%.     Time  4    Period  Weeks    Status  New            Plan - 12/20/16 1203    Clinical Impression Statement  Pt limited by knee pain, limited tolerance for seated and standing activities.  Attempted Nustep to improve activity tolerance and strengthening in pain free  range.  Pt c/o dizziness while ambulating toward machine, vitals assessed.  BP 188/91 mmHg, O2 sat 98% at HR 54bmp.  Session ended following vitals, pt stated she has not taken medication yet today. Encouraged pt to take meds as prescribed.  Care taker informed of dizziness and vitals as well as sample of Biofreeze for pain control.  MD called and informed of vitals today session.      Rehab Potential  Fair    Clinical Impairments Affecting Rehab Potential  (-) significant medical history, previous PT for current issue 3 yr ago; (+) motivated to ambulate with LRAD    PT Frequency  2x / week    PT Duration  4 weeks    PT Treatment/Interventions  ADLs/Self  Care Home Management;Stair training;Functional mobility training;Therapeutic activities;Therapeutic exercise;Balance training;Neuromuscular re-education;Gait training;DME Instruction;Patient/family education;Manual techniques    PT Next Visit Plan  Assess vitals prior tx.  dynamic balance exercises, seated hip strengthening exercises.  Continue to progress towards goals and decrease dependence on UE's.    PT Home Exercise Plan  Eval: chair exercises glute set x 10, knee extension 2x5, marching 2x5, abduction 2x5       Patient will benefit from skilled therapeutic intervention in order to improve the following deficits and impairments:  Abnormal gait, Decreased balance, Decreased activity tolerance, Cardiopulmonary status limiting activity, Decreased endurance, Decreased strength, Difficulty walking, Obesity, Postural dysfunction, Improper body mechanics  Visit Diagnosis: Unsteady gait  Muscle weakness (generalized)  Decreased functional mobility and endurance     Problem List Patient Active Problem List   Diagnosis Date Noted  . Chronic diastolic CHF (congestive heart failure) ([place]) 10/15/2016  . Elevated troponin 10/15/2016  . Type 2 diabetes mellitus with nephropathy ([place]) 10/15/2016  . Acute renal failure superimposed on stage 4 chronic kidney disease ([place]) 10/15/2016  . NSTEMI (non-ST elevated myocardial infarction) ([place]) 10/03/2015  . Elevated LFTs 10/03/2015  . Encounter for therapeutic drug monitoring 07/29/2015  . COPD with acute exacerbation ([place])   . COPD (chronic obstructive pulmonary disease) ([place]) 06/05/2015  . CKD stage 4 due to type 2 diabetes mellitus ([place]) 06/05/2015  . Anemia in chronic kidney disease, Stage IV 06/05/2015  . Acute on chronic diastolic CHF (congestive heart failure) ([place]) 06/05/2015  . Atrial fibrillation ([place]) 06/05/2015  . CHF exacerbation ([place]) 06/05/2015  . Atrial fibrillation with RVR ([place]) 12/08/2014  . Atrial fibrillation with rapid  ventricular response ([place]) 12/07/2014  . TIA (transient ischemic attack) 09/02/2014  . Gait difficulty 09/02/2014  . Other specified transient cerebral ischemias   . Stroke with cerebral ischemia ([place])   . Hemispheric carotid artery syndrome   . Essential hypertension   . Hyperlipidemia   . Type 2 diabetes mellitus with other circulatory complications ([place])   . CVA (cerebral infarction) 05/27/2014   Ihor Austin, [place]; [employer]  Aldona Lento 12/20/2016, 12:12 PM  [facility] [address] [place], [place], [address] Phone: [phone]   Fax:  [phone]  Name: Brecklyn Galvis MRN: [id number] Date of Birth: 1940-06-03

## 2016-12-22 ENCOUNTER — Ambulatory Visit (HOSPITAL_COMMUNITY): Payer: Medicare Other

## 2016-12-22 ENCOUNTER — Telehealth (HOSPITAL_COMMUNITY): Payer: Self-pay | Admitting: Internal Medicine

## 2016-12-22 NOTE — Telephone Encounter (Signed)
12/22/16  Pt cx said that she was still sick

## 2016-12-26 ENCOUNTER — Encounter (HOSPITAL_COMMUNITY): Payer: Medicare Other

## 2016-12-29 ENCOUNTER — Encounter (HOSPITAL_COMMUNITY): Payer: Self-pay

## 2016-12-29 ENCOUNTER — Ambulatory Visit (HOSPITAL_COMMUNITY): Payer: Medicare Other

## 2016-12-29 ENCOUNTER — Other Ambulatory Visit (HOSPITAL_COMMUNITY): Payer: Medicare Other

## 2016-12-29 ENCOUNTER — Encounter (HOSPITAL_BASED_OUTPATIENT_CLINIC_OR_DEPARTMENT_OTHER): Payer: Medicare Other

## 2016-12-29 ENCOUNTER — Encounter (HOSPITAL_COMMUNITY): Payer: Medicare Other

## 2016-12-29 VITALS — BP 159/87 | HR 51 | Temp 97.6°F | Resp 18

## 2016-12-29 VITALS — BP 213/80 | HR 56

## 2016-12-29 DIAGNOSIS — D631 Anemia in chronic kidney disease: Secondary | ICD-10-CM | POA: Diagnosis not present

## 2016-12-29 DIAGNOSIS — R2681 Unsteadiness on feet: Secondary | ICD-10-CM

## 2016-12-29 DIAGNOSIS — M6281 Muscle weakness (generalized): Secondary | ICD-10-CM

## 2016-12-29 DIAGNOSIS — N189 Chronic kidney disease, unspecified: Secondary | ICD-10-CM | POA: Diagnosis not present

## 2016-12-29 DIAGNOSIS — D509 Iron deficiency anemia, unspecified: Secondary | ICD-10-CM

## 2016-12-29 DIAGNOSIS — Z7409 Other reduced mobility: Secondary | ICD-10-CM

## 2016-12-29 LAB — CBC WITH DIFFERENTIAL/PLATELET
BASOS ABS: 0 10*3/uL (ref 0.0–0.1)
BASOS PCT: 0 %
Eosinophils Absolute: 0.3 10*3/uL (ref 0.0–0.7)
Eosinophils Relative: 5 %
HEMATOCRIT: 34.6 % — AB (ref 36.0–46.0)
HEMOGLOBIN: 10.7 g/dL — AB (ref 12.0–15.0)
Lymphocytes Relative: 18 %
Lymphs Abs: 1.1 10*3/uL (ref 0.7–4.0)
MCH: 30.1 pg (ref 26.0–34.0)
MCHC: 30.9 g/dL (ref 30.0–36.0)
MCV: 97.5 fL (ref 78.0–100.0)
Monocytes Absolute: 0.3 10*3/uL (ref 0.1–1.0)
Monocytes Relative: 5 %
NEUTROS ABS: 4.4 10*3/uL (ref 1.7–7.7)
NEUTROS PCT: 72 %
Platelets: 150 10*3/uL (ref 150–400)
RBC: 3.55 MIL/uL — ABNORMAL LOW (ref 3.87–5.11)
RDW: 17.3 % — ABNORMAL HIGH (ref 11.5–15.5)
WBC: 6.1 10*3/uL (ref 4.0–10.5)

## 2016-12-29 LAB — FERRITIN: FERRITIN: 96 ng/mL (ref 11–307)

## 2016-12-29 MED ORDER — DARBEPOETIN ALFA 500 MCG/ML IJ SOSY
500.0000 ug | PREFILLED_SYRINGE | Freq: Once | INTRAMUSCULAR | Status: AC
  Administered 2016-12-29: 500 ug via SUBCUTANEOUS
  Filled 2016-12-29: qty 1

## 2016-12-29 NOTE — Progress Notes (Signed)
Patient tolerated aranesp shot with no complaints of pain voiced.  Injection site clean and dry with no bruising or swelling noted at site.  Band aid applied.  Discharged via wheelchair with family.  No s/s of distress noted.

## 2016-12-29 NOTE — Patient Instructions (Signed)
[facility]  Discharge Instructions:  You received an aranesp shot today.  _______________________________________________________________  Thank you for choosing [facility] at [facility] to provide your oncology and hematology care.  To afford each patient quality time with our providers, please arrive at least 15 minutes before your scheduled appointment.  You need to re-schedule your appointment if you arrive 10 or more minutes late.  We strive to give you quality time with our providers, and arriving late affects you and other patients whose appointments are after yours.  Also, if you no show three or more times for appointments you may be dismissed from the clinic.  Again, thank you for choosing [facility] at [facility]. Our hope is that these requests will allow you access to exceptional care and in a timely manner. _______________________________________________________________  If you have questions after your visit, please contact our office at [phone] between the hours of 8:30 a.m. and 5:00 p.m. Voicemails left after 4:30 p.m. will not be returned until the following business day. _______________________________________________________________  For prescription refill requests, have your pharmacy contact our office. _______________________________________________________________  Recommendations made by the consultant and any test results will be sent to your referring physician. _______________________________________________________________ 

## 2016-12-29 NOTE — Therapy (Signed)
Jody [name], [place], [address] Phone: [phone]   Fax:  [phone]  December 29, 2016   '[email or web address]' @  Physical Therapy Discharge Summary  Patient: Jody [name]  MRN: [id number]  Date of Birth: November 18, 1940   Diagnosis: Unsteady gait  Muscle weakness (generalized)  Decreased functional mobility and endurance Referring Provider: Monico Blitz MD   The above patient had been seen in Physical Therapy 6 times of 9 treatments scheduled with 0 no shows and 2 cancellations.  The treatment consisted of ADLs/Self Care Home Management;Stair training;Functional mobility training;Therapeutic activities;Therapeutic exercise;Balance training;Neuromuscular re-education;Gait training;DME Instruction;Patient/family education;Manual techniques  The patient is: Unchanged  Subjective: Patient continues to have reports of "my leg feeling heavy", shortness of breath and weakness. She does not feel she has improved at all since initial evaluation.   Discharge Findings: Patient has shown improvements in FOTO outcome measure score by at least 10% and improved strength with MMT. Functional tests of gait speed and functional LE strength remain the same.   Functional Status at Discharge: Slowed gait speed, decreased functional strength sit to stand, increased BP readings and SOB with wheezing upon exertion.   Goals Partially Met  Plan - 12/29/16 1131    Clinical Impression Statement  Today's session started with taking patient BP which was 195/76 with 56 bpm after sitting in chair for 2 minutes. Patient evaluation was completed this session to determine progress thus far. Session began with manual muscle testing, TUG outcome measure and 5 time sit to stand functional strength testing. She presented with wheezing after both functional tests with increased RR. She denied dizziness or light-headedness. BP was checked again with rating of  213/80, 56 bpm. Session was concluded with therapist discussing with patient the significance of BP reading and encouraged to follow up with referring physician as soon as possible. PT spoke with caregiver and pt regarding decision to discharge until BP and fluid in distal extremities were addressed secondary to safety of patient. Patient left clinic with no significant symptoms to attend her next appointment of the day. PT spoke with Dr. Manuella Ghazi and he wanted to report to patient she needed to take 1 extra Lasix today. If she were to have increasing BP, remains that high or pt starts to feel worse he would like for her to go to the ER this weekend. Dr. Manuella Ghazi wanted PT to inform patient to come to office next week, as well. Patient's niece was contacted and PT left a voicemail regarding significance of BP readings to encourage follow up appointment to be made for next week with Dr. Manuella Ghazi. Pt is to be discharged at this time for further medical examination regarding BP and increased fluid in LEs.     Rehab Potential  Fair    Clinical Impairments Affecting Rehab Potential  (-) significant medical history, previous PT for current issue 3 yr ago; (+) motivated to ambulate with LRAD    PT Frequency  --    PT Duration  --    PT Treatment/Interventions  ADLs/Self Care Home Management;Stair training;Functional mobility training;Therapeutic activities;Therapeutic exercise;Balance training;Neuromuscular re-education;Gait training;DME Instruction;Patient/family education;Manual techniques    PT Next Visit Plan  Discharge at this time. Upon re-evluation, if necessary, follow up regardind compression stockings.     PT Home Exercise Plan  Eval: chair exercises glute set x 10, knee extension 2x5, marching 2x5, abduction 2x5       Sincerely,  [name] PT, DPT 11:51 AM, 12/29/16 [phone]  CC [email or web address] @  [facility] [address] [place], [place],  [address] Phone: [phone]   Fax:  [phone]  Patient: Jody [name]  MRN: [id number]  Date of Birth: June 11, 1940

## 2017-01-02 ENCOUNTER — Encounter (HOSPITAL_COMMUNITY): Payer: Medicare Other

## 2017-01-12 ENCOUNTER — Encounter (HOSPITAL_COMMUNITY): Payer: Medicare Other

## 2017-01-12 DIAGNOSIS — D631 Anemia in chronic kidney disease: Secondary | ICD-10-CM

## 2017-01-12 DIAGNOSIS — D509 Iron deficiency anemia, unspecified: Secondary | ICD-10-CM

## 2017-01-12 DIAGNOSIS — N189 Chronic kidney disease, unspecified: Secondary | ICD-10-CM | POA: Diagnosis not present

## 2017-01-12 LAB — CBC WITH DIFFERENTIAL/PLATELET
BASOS ABS: 0 10*3/uL (ref 0.0–0.1)
Basophils Relative: 0 %
EOS PCT: 5 %
Eosinophils Absolute: 0.3 10*3/uL (ref 0.0–0.7)
HCT: 36.6 % (ref 36.0–46.0)
Hemoglobin: 11.1 g/dL — ABNORMAL LOW (ref 12.0–15.0)
LYMPHS ABS: 0.9 10*3/uL (ref 0.7–4.0)
Lymphocytes Relative: 16 %
MCH: 29.2 pg (ref 26.0–34.0)
MCHC: 30.3 g/dL (ref 30.0–36.0)
MCV: 96.3 fL (ref 78.0–100.0)
MONO ABS: 0.3 10*3/uL (ref 0.1–1.0)
Monocytes Relative: 6 %
Neutro Abs: 4 10*3/uL (ref 1.7–7.7)
Neutrophils Relative %: 73 %
PLATELETS: 151 10*3/uL (ref 150–400)
RBC: 3.8 MIL/uL — AB (ref 3.87–5.11)
RDW: 17.6 % — AB (ref 11.5–15.5)
WBC: 5.5 10*3/uL (ref 4.0–10.5)

## 2017-01-12 LAB — FERRITIN: FERRITIN: 74 ng/mL (ref 11–307)

## 2017-01-12 MED ORDER — PEGFILGRASTIM 6 MG/0.6ML ~~LOC~~ PSKT
PREFILLED_SYRINGE | SUBCUTANEOUS | Status: AC
  Filled 2017-01-12: qty 1.2

## 2017-01-12 NOTE — Progress Notes (Signed)
Patient's hemoglobin 11.1 today, we will hold Aranesp injection per ordered parameters.

## 2017-01-26 ENCOUNTER — Other Ambulatory Visit: Payer: Self-pay

## 2017-01-26 ENCOUNTER — Encounter (HOSPITAL_COMMUNITY): Payer: Medicare Other | Attending: Oncology

## 2017-01-26 ENCOUNTER — Encounter (HOSPITAL_COMMUNITY): Payer: Self-pay

## 2017-01-26 VITALS — BP 207/75 | HR 56 | Temp 98.2°F | Resp 20

## 2017-01-26 DIAGNOSIS — N189 Chronic kidney disease, unspecified: Secondary | ICD-10-CM

## 2017-01-26 DIAGNOSIS — D631 Anemia in chronic kidney disease: Secondary | ICD-10-CM | POA: Insufficient documentation

## 2017-01-26 DIAGNOSIS — D509 Iron deficiency anemia, unspecified: Secondary | ICD-10-CM | POA: Diagnosis not present

## 2017-01-26 LAB — CBC WITH DIFFERENTIAL/PLATELET
Basophils Absolute: 0 10*3/uL (ref 0.0–0.1)
Basophils Relative: 0 %
EOS ABS: 0 10*3/uL (ref 0.0–0.7)
EOS PCT: 0 %
HCT: 31.5 % — ABNORMAL LOW (ref 36.0–46.0)
Hemoglobin: 9.8 g/dL — ABNORMAL LOW (ref 12.0–15.0)
LYMPHS ABS: 0.8 10*3/uL (ref 0.7–4.0)
Lymphocytes Relative: 11 %
MCH: 28.7 pg (ref 26.0–34.0)
MCHC: 31.1 g/dL (ref 30.0–36.0)
MCV: 92.4 fL (ref 78.0–100.0)
MONO ABS: 0.3 10*3/uL (ref 0.1–1.0)
Monocytes Relative: 4 %
Neutro Abs: 6.1 10*3/uL (ref 1.7–7.7)
Neutrophils Relative %: 85 %
PLATELETS: 169 10*3/uL (ref 150–400)
RBC: 3.41 MIL/uL — AB (ref 3.87–5.11)
RDW: 16 % — ABNORMAL HIGH (ref 11.5–15.5)
WBC: 7.1 10*3/uL (ref 4.0–10.5)

## 2017-01-26 LAB — FERRITIN: FERRITIN: 188 ng/mL (ref 11–307)

## 2017-01-26 MED ORDER — DARBEPOETIN ALFA 500 MCG/ML IJ SOSY
PREFILLED_SYRINGE | INTRAMUSCULAR | Status: AC
  Filled 2017-01-26: qty 1

## 2017-01-26 MED ORDER — DARBEPOETIN ALFA 500 MCG/ML IJ SOSY
500.0000 ug | PREFILLED_SYRINGE | Freq: Once | INTRAMUSCULAR | Status: AC
  Administered 2017-01-26: 500 ug via SUBCUTANEOUS

## 2017-01-26 NOTE — Patient Instructions (Signed)
[place] at [facility] Discharge Instructions  RECOMMENDATIONS MADE BY THE CONSULTANT AND ANY TEST RESULTS WILL BE SENT TO YOUR REFERRING PHYSICIAN.  You got your Aranesp injection today Your blood pressure is high today 193/75 and 207/75 If you start having any symptoms please report to the ER immediately Follow up as scheduled.  Thank you for choosing [place] at [facility] to provide your oncology and hematology care.  To afford each patient quality time with our provider, please arrive at least 15 minutes before your scheduled appointment time.    If you have a lab appointment with the [place] please come in thru the  Main Entrance and check in at the main information desk  You need to re-schedule your appointment should you arrive 10 or more minutes late.  We strive to give you quality time with our providers, and arriving late affects you and other patients whose appointments are after yours.  Also, if you no show three or more times for appointments you may be dismissed from the clinic at the providers discretion.     Again, thank you for choosing [facility].  Our hope is that these requests will decrease the amount of time that you wait before being seen by our physicians.       _____________________________________________________________  Should you have questions after your visit to [facility], please contact our office at [phone] between the hours of 8:30 a.m. and 4:30 p.m.  Voicemails left after 4:30 p.m. will not be returned until the following business day.  For prescription refill requests, have your pharmacy contact our office.       Resources For Cancer Patients and their Caregivers ? American Cancer Society: Can assist with transportation, wigs, general needs, runs Look Good Feel Better.        228-586-0716 ? Cancer Care: Provides financial assistance, online support groups,  medication/co-pay assistance.  1-800-813-HOPE 872 040 3884) ? [place] Assists [place] cancer patients and their families through emotional , educational and financial support.  740-282-0965 ? [place] DSS Where to apply for food stamps, Medicaid and utility assistance. (562)801-2139 ? RCATS: Transportation to medical appointments. (830)215-4748 ? Social Security Administration: May apply for disability if have a Stage IV cancer. 305-735-3158 980-855-2142 ? [employer], Disability and Transit Services: Assists with nutrition, care and transit needs. [place] Support Programs: @10RELATIVEDAYS @ > Cancer Support Group  2nd Tuesday of the month 1pm-2pm, Journey Room  > Creative Journey  3rd Tuesday of the month 1130am-1pm, Journey Room  > Look Good Feel Better  1st Wednesday of the month 10am-12 noon, Journey Room (Call [place] to register [phone])

## 2017-01-26 NOTE — Progress Notes (Signed)
Jody [name] presents today for injection per MD orders. Aranesp 500 mcg administered SQ in left lower abdomen. Administration without incident. Patient tolerated well. Patient discharged in stable condition from clinic via wheelchair with sitter. Patient to follow up as scheduled.

## 2017-02-09 ENCOUNTER — Encounter (HOSPITAL_BASED_OUTPATIENT_CLINIC_OR_DEPARTMENT_OTHER): Payer: Medicare Other

## 2017-02-09 ENCOUNTER — Other Ambulatory Visit: Payer: Self-pay

## 2017-02-09 ENCOUNTER — Encounter (HOSPITAL_COMMUNITY): Payer: Medicare Other

## 2017-02-09 ENCOUNTER — Encounter (HOSPITAL_COMMUNITY): Payer: Self-pay

## 2017-02-09 VITALS — BP 174/75 | HR 55 | Temp 97.8°F | Resp 20

## 2017-02-09 DIAGNOSIS — D631 Anemia in chronic kidney disease: Secondary | ICD-10-CM | POA: Diagnosis not present

## 2017-02-09 DIAGNOSIS — D509 Iron deficiency anemia, unspecified: Secondary | ICD-10-CM

## 2017-02-09 DIAGNOSIS — N189 Chronic kidney disease, unspecified: Secondary | ICD-10-CM | POA: Diagnosis not present

## 2017-02-09 LAB — CBC WITH DIFFERENTIAL/PLATELET
Basophils Absolute: 0 10*3/uL (ref 0.0–0.1)
Basophils Relative: 0 %
Eosinophils Absolute: 0.3 10*3/uL (ref 0.0–0.7)
Eosinophils Relative: 5 %
HEMATOCRIT: 34.5 % — AB (ref 36.0–46.0)
HEMOGLOBIN: 10.5 g/dL — AB (ref 12.0–15.0)
LYMPHS ABS: 0.8 10*3/uL (ref 0.7–4.0)
Lymphocytes Relative: 15 %
MCH: 29.3 pg (ref 26.0–34.0)
MCHC: 30.4 g/dL (ref 30.0–36.0)
MCV: 96.4 fL (ref 78.0–100.0)
MONO ABS: 0.5 10*3/uL (ref 0.1–1.0)
MONOS PCT: 9 %
NEUTROS ABS: 4.1 10*3/uL (ref 1.7–7.7)
NEUTROS PCT: 71 %
Platelets: 136 10*3/uL — ABNORMAL LOW (ref 150–400)
RBC: 3.58 MIL/uL — ABNORMAL LOW (ref 3.87–5.11)
RDW: 20.3 % — AB (ref 11.5–15.5)
WBC: 5.7 10*3/uL (ref 4.0–10.5)

## 2017-02-09 LAB — FERRITIN: Ferritin: 83 ng/mL (ref 11–307)

## 2017-02-09 MED ORDER — DARBEPOETIN ALFA 500 MCG/ML IJ SOSY
PREFILLED_SYRINGE | INTRAMUSCULAR | Status: AC
  Filled 2017-02-09: qty 1

## 2017-02-09 MED ORDER — DARBEPOETIN ALFA 500 MCG/ML IJ SOSY
500.0000 ug | PREFILLED_SYRINGE | Freq: Once | INTRAMUSCULAR | Status: AC
  Administered 2017-02-09: 500 ug via SUBCUTANEOUS

## 2017-02-09 NOTE — Progress Notes (Signed)
Jody [name] presents today for injection per MD orders. Aranesp 500 mcg administered SQ in right lower abdomen. Administration without incident. Patient tolerated well. Patient discharged in stable condition via wheelchair with sister. Patient to follow up as scheduled.

## 2017-02-09 NOTE — Patient Instructions (Signed)
[place] at [facility] Discharge Instructions  RECOMMENDATIONS MADE BY THE CONSULTANT AND ANY TEST RESULTS WILL BE SENT TO YOUR REFERRING PHYSICIAN.  Your hemoglobin today is 10.5. You received your Aranesp injection.  Follow up as scheduled.  Thank you for choosing [place] at [facility] to provide your oncology and hematology care.  To afford each patient quality time with our provider, please arrive at least 15 minutes before your scheduled appointment time.    If you have a lab appointment with the [place] please come in thru the  Main Entrance and check in at the main information desk  You need to re-schedule your appointment should you arrive 10 or more minutes late.  We strive to give you quality time with our providers, and arriving late affects you and other patients whose appointments are after yours.  Also, if you no show three or more times for appointments you may be dismissed from the clinic at the providers discretion.     Again, thank you for choosing [facility].  Our hope is that these requests will decrease the amount of time that you wait before being seen by our physicians.       _____________________________________________________________  Should you have questions after your visit to [facility], please contact our office at [phone] between the hours of 8:30 a.m. and 4:30 p.m.  Voicemails left after 4:30 p.m. will not be returned until the following business day.  For prescription refill requests, have your pharmacy contact our office.       Resources For Cancer Patients and their Caregivers ? American Cancer Society: Can assist with transportation, wigs, general needs, runs Look Good Feel Better.        352-088-6554 ? Cancer Care: Provides financial assistance, online support groups, medication/co-pay assistance.  1-800-813-HOPE (708)422-7890) ? [place] Assists [place] cancer patients and their families through emotional , educational and financial support.  712-668-4658 ? [place] DSS Where to apply for food stamps, Medicaid and utility assistance. (867)623-1656 ? RCATS: Transportation to medical appointments. 920-269-4300 ? Social Security Administration: May apply for disability if have a Stage IV cancer. 252-015-5593 626-785-7402 ? [employer], Disability and Transit Services: Assists with nutrition, care and transit needs. [place] Support Programs: @10RELATIVEDAYS @ > Cancer Support Group  2nd Tuesday of the month 1pm-2pm, Journey Room  > Creative Journey  3rd Tuesday of the month 1130am-1pm, Journey Room  > Look Good Feel Better  1st Wednesday of the month 10am-12 noon, Journey Room (Call [place] to register [phone])

## 2017-02-12 ENCOUNTER — Other Ambulatory Visit (HOSPITAL_COMMUNITY): Payer: Medicare Other

## 2017-02-12 ENCOUNTER — Ambulatory Visit (HOSPITAL_COMMUNITY): Payer: Medicare Other

## 2017-02-23 ENCOUNTER — Inpatient Hospital Stay (HOSPITAL_BASED_OUTPATIENT_CLINIC_OR_DEPARTMENT_OTHER): Payer: Medicare Other | Admitting: Oncology

## 2017-02-23 ENCOUNTER — Inpatient Hospital Stay (HOSPITAL_COMMUNITY): Payer: Medicare Other | Attending: Oncology

## 2017-02-23 ENCOUNTER — Other Ambulatory Visit: Payer: Self-pay

## 2017-02-23 ENCOUNTER — Encounter (HOSPITAL_COMMUNITY): Payer: Self-pay | Admitting: Oncology

## 2017-02-23 ENCOUNTER — Inpatient Hospital Stay (HOSPITAL_COMMUNITY): Payer: Medicare Other

## 2017-02-23 VITALS — BP 203/80 | HR 59 | Temp 97.8°F | Resp 20 | Ht 67.0 in | Wt 216.5 lb

## 2017-02-23 DIAGNOSIS — I129 Hypertensive chronic kidney disease with stage 1 through stage 4 chronic kidney disease, or unspecified chronic kidney disease: Secondary | ICD-10-CM | POA: Diagnosis not present

## 2017-02-23 DIAGNOSIS — D631 Anemia in chronic kidney disease: Secondary | ICD-10-CM

## 2017-02-23 DIAGNOSIS — E875 Hyperkalemia: Secondary | ICD-10-CM | POA: Diagnosis not present

## 2017-02-23 DIAGNOSIS — D509 Iron deficiency anemia, unspecified: Secondary | ICD-10-CM | POA: Insufficient documentation

## 2017-02-23 DIAGNOSIS — N184 Chronic kidney disease, stage 4 (severe): Secondary | ICD-10-CM

## 2017-02-23 LAB — CBC WITH DIFFERENTIAL/PLATELET
BASOS ABS: 0 10*3/uL (ref 0.0–0.1)
BASOS PCT: 1 %
EOS ABS: 0.3 10*3/uL (ref 0.0–0.7)
Eosinophils Relative: 5 %
HCT: 37.9 % (ref 36.0–46.0)
HEMOGLOBIN: 11.2 g/dL — AB (ref 12.0–15.0)
Lymphocytes Relative: 11 %
Lymphs Abs: 0.6 10*3/uL — ABNORMAL LOW (ref 0.7–4.0)
MCH: 29.2 pg (ref 26.0–34.0)
MCHC: 29.6 g/dL — AB (ref 30.0–36.0)
MCV: 98.7 fL (ref 78.0–100.0)
Monocytes Absolute: 0.5 10*3/uL (ref 0.1–1.0)
Monocytes Relative: 9 %
NEUTROS PCT: 74 %
Neutro Abs: 3.7 10*3/uL (ref 1.7–7.7)
Platelets: 139 10*3/uL — ABNORMAL LOW (ref 150–400)
RBC: 3.84 MIL/uL — AB (ref 3.87–5.11)
RDW: 20.3 % — ABNORMAL HIGH (ref 11.5–15.5)
WBC: 5 10*3/uL (ref 4.0–10.5)

## 2017-02-23 LAB — COMPREHENSIVE METABOLIC PANEL
ALBUMIN: 3.9 g/dL (ref 3.5–5.0)
ALK PHOS: 100 U/L (ref 38–126)
ALT: 16 U/L (ref 14–54)
ANION GAP: 10 (ref 5–15)
AST: 19 U/L (ref 15–41)
BUN: 54 mg/dL — ABNORMAL HIGH (ref 6–20)
CALCIUM: 9 mg/dL (ref 8.9–10.3)
CO2: 17 mmol/L — AB (ref 22–32)
Chloride: 112 mmol/L — ABNORMAL HIGH (ref 101–111)
Creatinine, Ser: 2.84 mg/dL — ABNORMAL HIGH (ref 0.44–1.00)
GFR calc Af Amer: 17 mL/min — ABNORMAL LOW (ref >60)
GFR calc non Af Amer: 15 mL/min — ABNORMAL LOW (ref >60)
GLUCOSE: 106 mg/dL — AB (ref 65–99)
Potassium: 5.8 mmol/L — ABNORMAL HIGH (ref 3.5–5.1)
SODIUM: 139 mmol/L (ref 135–145)
Total Bilirubin: 0.8 mg/dL (ref 0.3–1.2)
Total Protein: 7.6 g/dL (ref 6.5–8.1)

## 2017-02-23 LAB — IRON AND TIBC
Iron: 76 ug/dL (ref 28–170)
SATURATION RATIOS: 23 % (ref 10.4–31.8)
TIBC: 325 ug/dL (ref 250–450)
UIBC: 249 ug/dL

## 2017-02-23 LAB — FERRITIN: Ferritin: 76 ng/mL (ref 11–307)

## 2017-02-23 MED ORDER — SODIUM POLYSTYRENE SULFONATE PO POWD: Freq: Once | ORAL | 0 refills | Status: AC

## 2017-02-23 NOTE — Progress Notes (Signed)
Aranesp held today for hemoglobin of 11.2.

## 2017-02-23 NOTE — Patient Instructions (Signed)
[place] at [facility] Discharge Instructions  RECOMMENDATIONS MADE BY THE CONSULTANT AND ANY TEST RESULTS WILL BE SENT TO YOUR REFERRING PHYSICIAN.  You were seen today by Rulon Abide, NP Your potassium and blood pressure are elevated. We need you to see Dr. Brigitte Pulse next week for your scheduled appointment and let him manage those. We have called in a prescription for [facility] for you to take one dose.  This will help bring your potassium down.   Do not take your potassium pills for one week.  Thank you for choosing [place] at [facility] to provide your oncology and hematology care.  To afford each patient quality time with our provider, please arrive at least 15 minutes before your scheduled appointment time.    If you have a lab appointment with the [place] please come in thru the  Main Entrance and check in at the main information desk  You need to re-schedule your appointment should you arrive 10 or more minutes late.  We strive to give you quality time with our providers, and arriving late affects you and other patients whose appointments are after yours.  Also, if you no show three or more times for appointments you may be dismissed from the clinic at the providers discretion.     Again, thank you for choosing [facility].  Our hope is that these requests will decrease the amount of time that you wait before being seen by our physicians.       _____________________________________________________________  Should you have questions after your visit to [facility], please contact our office at [phone] between the hours of 8:30 a.m. and 4:30 p.m.  Voicemails left after 4:30 p.m. will not be returned until the following business day.  For prescription refill requests, have your pharmacy contact our office.       Resources For Cancer Patients and their Caregivers ? American Cancer Society: Can  assist with transportation, wigs, general needs, runs Look Good Feel Better.        715-098-2309 ? Cancer Care: Provides financial assistance, online support groups, medication/co-pay assistance.  1-800-813-HOPE 702-330-6213) ? [place] Assists [place] cancer patients and their families through emotional , educational and financial support.  419-849-4694 ? [place] DSS Where to apply for food stamps, Medicaid and utility assistance. (434) 506-2013 ? RCATS: Transportation to medical appointments. 562-347-2305 ? Social Security Administration: May apply for disability if have a Stage IV cancer. (434) [phone] ? [employer], Disability and Transit Services: Assists with nutrition, care and transit needs. [place] Support Programs: @10RELATIVEDAYS @ > Cancer Support Group  2nd Tuesday of the month 1pm-2pm, Journey Room  > Creative Journey  3rd Tuesday of the month 1130am-1pm, Journey Room  > Look Good Feel Better  1st Wednesday of the month 10am-12 noon, Journey Room (Call [place] to register [phone])

## 2017-02-23 NOTE — Progress Notes (Signed)
[place], [place] [address]   CLINIC:  Medical Oncology/Hematology  PCP:  Monico Blitz, MD [place] [address] [phone]   REASON FOR VISIT:  Follow-up for anemia in the setting of chronic kidney disease   CURRENT THERAPY: Q 2 week Aranesp  HISTORY OF PRESENT ILLNESS:  (From Kirby Crigler, PA-C's last note: 04/25/16)    INTERVAL HISTORY:  [name] 77 y.o. female here for routine follow-up for anemia due to stage IV chronic kidney disease.   Today patient presents for labs and follow-up.  Hemoglobin is 11.2.  It appears she has been receiving Aranesp subcu biweekly since 11/17/2016.  She tells me today she feels good.  She was seen by her PCP Dr. Brigitte Pulse on Monday of this week where she was evaluated for a blood clot in left leg.  She states she had "an ultrasound done of left leg".  She complains of left leg swelling but denies pain or change in temperature to skin.  Her appetite is 100% and energy level 75%.  She notes numbness and burning in bilateral lower extremities.  This is chronic for her.  She is interested in results from her ultrasound from Monday at PCP office.  She denies shortness of breath, chest pain, abdominal pain, diarrhea, constipation, urinary frequency or dysuria.   REVIEW OF SYSTEMS:  Review of Systems  Constitutional: Negative for appetite change, chills, fatigue and fever.  HENT:  Negative.   Eyes: Negative.   Respiratory: Negative for cough and shortness of breath.        Dyspnea on exertion  Cardiovascular: Positive for leg swelling. Negative for chest pain and palpitations.       Bilateral lower extremities. Left leg worse.   Gastrointestinal: Negative.  Negative for abdominal pain, blood in stool, constipation, diarrhea, nausea and vomiting.  Endocrine: Negative.   Genitourinary: Negative.  Negative for dysuria, hematuria and vaginal bleeding.   Musculoskeletal: Positive for arthralgias.    Neurological: Positive for extremity weakness (in legs). Negative for dizziness and headaches.  Hematological: Negative.   Psychiatric/Behavioral: Negative for sleep disturbance.     PAST MEDICAL/SURGICAL HISTORY:  Past Medical History:  Diagnosis Date  . Anemia in chronic kidney disease (CODE) 06/05/2015  . Arthritis    "bad in my legs" (12/07/2014)  . Atrial fibrillation ([place]) 06/05/2015  . Chronic back pain    "dr said my spine is crooked"  . Chronic bronchitis ([place])    "get it q yr" (12/07/2014)  . CKD stage 4 due to type 2 diabetes mellitus ([place]) 06/05/2015  . COPD (chronic obstructive pulmonary disease) ([place])   . Gait difficulty 09/02/2014  . Heart disease   . Hypercholesterolemia   . Hypertension   . Neuropathy 2015   in legs  . Stroke ([place]) 05/2014   denies residual on 12/07/2014  . Stroke with cerebral ischemia ([place])   . Type II diabetes mellitus ([place])    Past Surgical History:  Procedure Laterality Date  . ABDOMINAL HYSTERECTOMY  ~ 1970  . APPENDECTOMY  ~ 1970  . JOINT REPLACEMENT    . TOTAL KNEE ARTHROPLASTY Right ~ 1986  . TUMOR EXCISION  ~ 1970   "9# in my stomach"     SOCIAL HISTORY:  Social History   Socioeconomic History  . Marital status: Widowed    Spouse name: Not on file  . Number of children: 0  . Years of education: 9  . Highest education level:  Not on file  Social Needs  . Financial resource strain: Not on file  . Food insecurity - worry: Not on file  . Food insecurity - inability: Not on file  . Transportation needs - medical: Not on file  . Transportation needs - non-medical: Not on file  Occupational History  . Occupation: retired  Tobacco Use  . Smoking status: Former Smoker    Packs/day: 0.50    Years: 50.00    Pack years: 25.00    Types: Cigarettes    Start date: 03/07/1953    Last attempt to quit: 01/14/2004    Years since quitting: 13.1  . Smokeless tobacco: Never Used  . Tobacco comment: "quit smoking cigarettes  in ~  2005"  Substance and Sexual Activity  . Alcohol use: No    Alcohol/week: 0.0 oz    Comment: 10/242016 "quit drinking beer in ~ 1977"  . Drug use: No  . Sexual activity: No  Other Topics Concern  . Not on file  Social History Narrative   Patient drinks caffeine a few times a week.   Patient is right handed.    FAMILY HISTORY:  Family History  Problem Relation Age of Onset  . Cancer Other   . Heart attack Brother   . Cancer Sister        breast  . Alcohol abuse Brother     CURRENT MEDICATIONS:  Outpatient Encounter Medications as of 02/23/2017  Medication Sig  . albuterol (PROVENTIL HFA;VENTOLIN HFA) 108 (90 BASE) MCG/ACT inhaler Inhale 1-2 puffs into the lungs every 6 (six) hours as needed for wheezing or shortness of breath. Reported on 03/01/2015  . amLODipine (NORVASC) 10 MG tablet Take 1 tablet (10 mg total) by mouth daily.  [name] aspirin EC 81 MG tablet Take 81 mg by mouth daily.  [name] atorvastatin (LIPITOR) 40 MG tablet Take 1 tablet by mouth daily.  . B-D UF III MINI PEN NEEDLES 31G X 5 MM MISC   . ELIQUIS 2.5 MG TABS tablet Take 1 tablet (2.5 mg total) by mouth 2 (two) times daily.  . fluticasone-salmeterol (ADVAIR HFA) 230-21 MCG/ACT inhaler Inhale 2 puffs into the lungs 2 (two) times daily.  . furosemide (LASIX) 40 MG tablet Take 1 tablet (40 mg total) by mouth daily.  [name] gabapentin (NEURONTIN) 400 MG capsule take 1 capsule by mouth three times a day  . hydrALAZINE (APRESOLINE) 100 MG tablet Take 1 tablet (100 mg total) by mouth 3 (three) times daily.  . Insulin Degludec (TRESIBA FLEXTOUCH) 100 UNIT/ML SOPN Inject 14 Units into the skin daily.   . metoprolol tartrate (LOPRESSOR) 25 MG tablet Take 0.5 tablets (12.5 mg total) by mouth 2 (two) times daily.  . ONE TOUCH ULTRA TEST test strip   . predniSONE (DELTASONE) 10 MG tablet Take 4 tablets (40 mg total) by mouth daily with breakfast. And decrease by one tablet daily  . sodium bicarbonate 650 MG tablet Take 1 tablet (650 mg  total) by mouth 2 (two) times daily.  . sodium polystyrene (KAYEXALATE) powder Take by mouth once for 1 dose.  . [DISCONTINUED] gabapentin (NEURONTIN) 300 MG capsule Take 1 capsule by mouth daily.   No facility-administered encounter medications on file as of 02/23/2017.     ALLERGIES:  Allergies  Allergen Reactions  . Codeine Nausea And Vomiting     PHYSICAL EXAM:  ECOG Performance status: 2-3 - Symptomatic; requires assistance.   Vitals:   02/23/17 1106  BP: (!) 203/80  Pulse: (!) 59  Resp: 20  Temp: 97.8 F (36.6 C)  SpO2: 99%   Filed Weights   02/23/17 1106  Weight: 216 lb 8 oz (98.2 kg)    Physical Exam  Constitutional: She is oriented to person, place, and time and well-developed, well-nourished, and in no distress. No distress.  Examined seated in wheelchair  Morbidly obese  HENT:  Head: Normocephalic and atraumatic.  Mouth/Throat: Oropharynx is clear and moist. No oropharyngeal exudate.  Eyes: Conjunctivae are normal. Pupils are equal, round, and reactive to light. No scleral icterus.  Neck: Normal range of motion. Neck supple. No JVD present.  Cardiovascular: Normal rate, regular rhythm and normal heart sounds. Exam reveals no gallop and no friction rub.  No murmur heard. Hypertensive  Pulmonary/Chest: Effort normal and breath sounds normal. No respiratory distress. She has no wheezes. She has no rales.  Abdominal: Soft. Bowel sounds are normal. She exhibits no distension. There is no tenderness. There is no rebound and no guarding.  Musculoskeletal: She exhibits edema. She exhibits no tenderness.  Left +2 pitting edema.  RLE +1 pitting edema   Lymphadenopathy:    She has no cervical adenopathy.       Right: No supraclavicular adenopathy present.       Left: No supraclavicular adenopathy present.  Neurological: She is alert and oriented to person, place, and time. No cranial nerve deficit.  Skin: Skin is warm and dry. No rash noted. No erythema. No pallor.   Psychiatric: Mood, memory, affect and judgment normal.  Nursing note and vitals reviewed.   LABORATORY DATA:  I have reviewed the labs as listed.  CBC    Component Value Date/Time   WBC 5.0 02/23/2017 0943   RBC 3.84 (L) 02/23/2017 0943   HGB 11.2 (L) 02/23/2017 0943   HCT 37.9 02/23/2017 0943   PLT 139 (L) 02/23/2017 0943   MCV 98.7 02/23/2017 0943   MCH 29.2 02/23/2017 0943   MCHC 29.6 (L) 02/23/2017 0943   RDW 20.3 (H) 02/23/2017 0943   LYMPHSABS 0.6 (L) 02/23/2017 0943   MONOABS 0.5 02/23/2017 0943   EOSABS 0.3 02/23/2017 0943   BASOSABS 0.0 02/23/2017 0943   CMP Latest Ref Rng & Units 02/23/2017 10/18/2016 10/17/2016  Glucose 65 - 99 mg/dL 106(H) 169(H) 231(H)  BUN 6 - 20 mg/dL 54(H) 72(H) 76(H)  Creatinine 0.44 - 1.00 mg/dL 2.84(H) 2.56(H) 2.93(H)  Sodium 135 - 145 mmol/L 139 139 139  Potassium 3.5 - 5.1 mmol/L 5.8(H) 3.4(L) 4.8  Chloride 101 - 111 mmol/L 112(H) 110 112(H)  CO2 22 - 32 mmol/L 17(L) 21(L) 16(L)  Calcium 8.9 - 10.3 mg/dL 9.0 8.3(L) 8.8(L)  Total Protein 6.5 - 8.1 g/dL 7.6 - -  Total Bilirubin 0.3 - 1.2 mg/dL 0.8 - -  Alkaline Phos 38 - 126 U/L 100 - -  AST 15 - 41 U/L 19 - -  ALT 14 - 54 U/L 16 - -   Results for CAPRINA, WUSSOW (MRN [id number])   Ref. Range 04/25/2016 12:00  Iron Latest Ref Range: 28 - 170 ug/dL 56  UIBC Latest Units: ug/dL 281  TIBC Latest Ref Range: 250 - 450 ug/dL 337  Saturation Ratios Latest Ref Range: 10.4 - 31.8 % 17  Ferritin Latest Ref Range: 11 - 307 ng/mL 84   Results for ANUPAMA, PIEHL (MRN [id number])   Ref. Range 04/25/2016 12:01  Folate Latest Ref Range: >5.9 ng/mL 16.2   Results for JAMIELYN, PETRUCCI (MRN [id number])   Ref. Range 04/25/2016 12:00  CRP Latest Ref  Range: <1.0 mg/dL <0.8  Vitamin B12 Latest Ref Range: 180 - 914 pg/mL 441   Results for CLYDEAN, POSAS (MRN [id number])  Ref. Range 04/25/2016 12:00  Basophils Absolute Latest Ref Range: 0.0 - 0.1 K/uL 0.0  RBC Morphology Unknown SLIGHT ANISOCYTOSIS   RBC. Latest Ref Range: 3.87 - 5.11 MIL/uL 2.79 (L)  Retic Ct Pct Latest Ref Range: 0.4 - 3.1 % 3.9 (H)  Retic Count, Manual Latest Ref Range: 19.0 - 186.0 K/uL 108.8   Results for JINNIFER, MONTEJANO (MRN [id number])  Ref. Range 04/25/2016 12:00  Haptoglobin Latest Ref Range: 34 - 200 mg/dL 147  Erythropoietin Latest Ref Range: 2.6 - 18.5 mIU/mL 32.5 (H)  Sed Rate Latest Ref Range: 0 - 22 mm/hr 124 (H)   Results for KAELEIGH, WESTENDORF (MRN [id number])  Ref. Range 04/25/2016 12:00  Hgb A Latest Ref Range: 96.4 - 98.8 % 97.8  Hgb A2 Quant Latest Ref Range: 1.8 - 3.2 % 2.2  Hgb F Quant Latest Ref Range: 0.0 - 2.0 % 0.0  Hgb S Quant Latest Ref Range: 0.0 % 0.0  HGB C Latest Ref Range: 0.0 % 0.0  HGB VARIANT Latest Ref Range: 0.0 % 0.0   Results for AFNAN, CADIENTE (MRN [id number])  Ref. Range 04/25/2016 12:01  Total Protein ELP Latest Ref Range: 6.0 - 8.5 g/dL 7.2  Albumin ELP Latest Ref Range: 2.9 - 4.4 g/dL 3.3  Globulin, Total Latest Ref Range: 2.2 - 3.9 g/dL 3.9  A/G Ratio Latest Ref Range: 0.7 - 1.7  0.8  Alpha-1-Globulin Latest Ref Range: 0.0 - 0.4 g/dL 0.2  Alpha-2-Globulin Latest Ref Range: 0.4 - 1.0 g/dL 0.8  Beta Globulin Latest Ref Range: 0.7 - 1.3 g/dL 1.0  Gamma Globulin Latest Ref Range: 0.4 - 1.8 g/dL 1.9 (H)  M-SPIKE, % Latest Ref Range: Not Observed g/dL Not Observed         ASSESSMENT & PLAN:   Iron deficiency anemia:  Historically ferritin levels have been steadily above 100. Today 76. She is not symptomatic. She last received injectofer of 09/26/16. Based on iron panel, please set patient up for 1 doses of IV injectofer 750 mg.  Calculated iron deficit is ~ 590 mg with a target HGB of 14 g/dL.  Supportive therapy plan is built.  Anemia due to chronic kidney disease:  Continue Aranesp injections every 2 weeks for hemoglobin 11 g/dL or less. Hemoglobin 11.2 g/dL today. Patient will not receive Aranesp today.   Hypertension: Patient severely hypertensive.  Initial  blood pressure 203/80.  Recheck 175/65.  Patient admits compliance with blood pressure medicines.  She states her blood pressure always rises when she comes for her appointment here.  Nursing faxed over lab results including vitals to help with manipulation of blood pressure medicines for better control.  Hyperkalemia: Patient's potassium today is 5.8.  Patient states she is currently taking potassium pills.  Cannot locate on medication list.  Asked patient to call when at home with medications to verify.  If on potassium, hold for 1 week.  Patient scheduled to see PCP next Wednesday.  We will let them adjust dosing accordingly.  Rx Kayexalate 1 dose.  Labs and new prescription information faxed to PCP.  RTC in 3 months for follow up with labs.  Greater than 50% was spent in counseling and coordination of care with this patient including but not limited to discussion of the relevant topics above (See A&P) including, but not limited to diagnosis and management of acute and chronic medical  conditions.   All questions were answered to patient's stated satisfaction. Encouraged patient to call with any new concerns or questions before her next visit to the cancer center and we can certain see her sooner, if needed.    Faythe Casa, NP 02/23/2017 3:45 PM

## 2017-02-26 ENCOUNTER — Ambulatory Visit (HOSPITAL_COMMUNITY): Payer: Medicare Other

## 2017-02-26 ENCOUNTER — Ambulatory Visit (HOSPITAL_COMMUNITY): Payer: Medicare Other | Admitting: Hematology and Oncology

## 2017-02-26 ENCOUNTER — Other Ambulatory Visit (HOSPITAL_COMMUNITY): Payer: Medicare Other

## 2017-03-06 ENCOUNTER — Encounter (HOSPITAL_COMMUNITY): Payer: Self-pay

## 2017-03-06 ENCOUNTER — Inpatient Hospital Stay (HOSPITAL_COMMUNITY): Payer: Medicare Other

## 2017-03-06 VITALS — BP 176/70 | HR 60 | Temp 97.9°F | Resp 20

## 2017-03-06 DIAGNOSIS — D509 Iron deficiency anemia, unspecified: Secondary | ICD-10-CM

## 2017-03-06 DIAGNOSIS — N184 Chronic kidney disease, stage 4 (severe): Secondary | ICD-10-CM | POA: Diagnosis not present

## 2017-03-06 MED ORDER — SODIUM CHLORIDE 0.9 % IV SOLN
INTRAVENOUS | Status: DC
  Administered 2017-03-06: 12:00:00 via INTRAVENOUS

## 2017-03-06 MED ORDER — SODIUM CHLORIDE 0.9 % IV SOLN
750.0000 mg | Freq: Once | INTRAVENOUS | Status: AC
  Administered 2017-03-06: 750 mg via INTRAVENOUS
  Filled 2017-03-06: qty 15

## 2017-03-06 NOTE — Patient Instructions (Signed)
[place] at [facility] Discharge Instructions  RECOMMENDATIONS MADE BY THE CONSULTANT AND ANY TEST RESULTS WILL BE SENT TO YOUR REFERRING PHYSICIAN.  Received Injetafer infusion today. Follow-up as scheduled. Call clinic for any questions or concerns  Thank you for choosing [place] at [facility] to provide your oncology and hematology care.  To afford each patient quality time with our provider, please arrive at least 15 minutes before your scheduled appointment time.    If you have a lab appointment with the [facility] please come in thru the  Main Entrance and check in at the main information desk  You need to re-schedule your appointment should you arrive 10 or more minutes late.  We strive to give you quality time with our providers, and arriving late affects you and other patients whose appointments are after yours.  Also, if you no show three or more times for appointments you may be dismissed from the clinic at the providers discretion.     Again, thank you for choosing [facility].  Our hope is that these requests will decrease the amount of time that you wait before being seen by our physicians.       _____________________________________________________________  Should you have questions after your visit to [facility], please contact our office at [phone] between the hours of 8:30 a.m. and 4:30 p.m.  Voicemails left after 4:30 p.m. will not be returned until the following business day.  For prescription refill requests, have your pharmacy contact our office.       Resources For Cancer Patients and their Caregivers ? American Cancer Society: Can assist with transportation, wigs, general needs, runs Look Good Feel Better.        780-813-2765 ? Cancer Care: Provides financial assistance, online support groups, medication/co-pay assistance.  1-800-813-HOPE 579-528-7477) ? [facility] Assists [place] cancer patients and their families through emotional , educational and financial support.  425-268-3668 ? [place] DSS Where to apply for food stamps, Medicaid and utility assistance. 940-244-5791 ? [employer]: Transportation to medical appointments. 847-097-1896 ? Social Security Administration: May apply for disability if have a Stage IV cancer. (402)779-4726 (579)562-3325 ? [employer], Disability and Transit Services: Assists with nutrition, care and transit needs. [place] Support Programs: @10RELATIVEDAYS @ > Cancer Support Group  2nd Tuesday of the month 1pm-2pm, Journey Room  > Creative Journey  3rd Tuesday of the month 1130am-1pm, Journey Room  > Look Good Feel Better  1st Wednesday of the month 10am-12 noon, Journey Room (Call [place] to register [phone])

## 2017-03-06 NOTE — Progress Notes (Signed)
Jody [name] tolerated Injectafer infusion well without complaints or incident.VSS upon discharge. Pt discharged via wheelchair in satisfactory condition accompanied by her nurses aide

## 2017-03-12 ENCOUNTER — Ambulatory Visit (HOSPITAL_COMMUNITY): Payer: Medicare Other

## 2017-03-13 ENCOUNTER — Ambulatory Visit (HOSPITAL_COMMUNITY): Payer: Medicare Other

## 2017-03-13 ENCOUNTER — Other Ambulatory Visit (HOSPITAL_COMMUNITY): Payer: Medicare Other

## 2017-03-26 ENCOUNTER — Other Ambulatory Visit (HOSPITAL_COMMUNITY): Payer: Self-pay | Admitting: *Deleted

## 2017-03-27 ENCOUNTER — Inpatient Hospital Stay (HOSPITAL_COMMUNITY): Payer: Medicare Other | Attending: Internal Medicine

## 2017-03-27 ENCOUNTER — Other Ambulatory Visit: Payer: Self-pay

## 2017-03-27 ENCOUNTER — Encounter (HOSPITAL_COMMUNITY): Payer: Self-pay

## 2017-03-27 ENCOUNTER — Inpatient Hospital Stay (HOSPITAL_COMMUNITY): Payer: Medicare Other

## 2017-03-27 VITALS — BP 188/80 | HR 60 | Temp 98.0°F | Resp 20

## 2017-03-27 DIAGNOSIS — N189 Chronic kidney disease, unspecified: Secondary | ICD-10-CM | POA: Diagnosis not present

## 2017-03-27 DIAGNOSIS — D631 Anemia in chronic kidney disease: Secondary | ICD-10-CM

## 2017-03-27 LAB — CBC WITH DIFFERENTIAL/PLATELET
BASOS ABS: 0 10*3/uL (ref 0.0–0.1)
BASOS PCT: 1 %
Eosinophils Absolute: 0.4 10*3/uL (ref 0.0–0.7)
Eosinophils Relative: 7 %
HEMATOCRIT: 27.2 % — AB (ref 36.0–46.0)
Hemoglobin: 8.3 g/dL — ABNORMAL LOW (ref 12.0–15.0)
LYMPHS PCT: 14 %
Lymphs Abs: 0.9 10*3/uL (ref 0.7–4.0)
MCH: 29.4 pg (ref 26.0–34.0)
MCHC: 30.5 g/dL (ref 30.0–36.0)
MCV: 96.5 fL (ref 78.0–100.0)
Monocytes Absolute: 0.4 10*3/uL (ref 0.1–1.0)
Monocytes Relative: 6 %
NEUTROS ABS: 4.3 10*3/uL (ref 1.7–7.7)
Neutrophils Relative %: 72 %
Platelets: 142 10*3/uL — ABNORMAL LOW (ref 150–400)
RBC: 2.82 MIL/uL — AB (ref 3.87–5.11)
RDW: 17.5 % — ABNORMAL HIGH (ref 11.5–15.5)
WBC: 6 10*3/uL (ref 4.0–10.5)

## 2017-03-27 MED ORDER — DARBEPOETIN ALFA 500 MCG/ML IJ SOSY
500.0000 ug | PREFILLED_SYRINGE | Freq: Once | INTRAMUSCULAR | Status: AC
  Administered 2017-03-27: 500 ug via SUBCUTANEOUS
  Filled 2017-03-27: qty 1

## 2017-03-27 NOTE — Progress Notes (Signed)
Jody [name] presents today for injection per the provider's orders.  Aranesp administration without incident; see MAR for injection details.  Patient tolerated procedure well and without incident.  No questions or complaints noted at this time.  Discharged via wheelchair in c/o caregiver.

## 2017-04-10 ENCOUNTER — Other Ambulatory Visit (HOSPITAL_COMMUNITY): Payer: Medicare Other

## 2017-04-10 ENCOUNTER — Ambulatory Visit (HOSPITAL_COMMUNITY): Payer: Medicare Other

## 2017-04-24 ENCOUNTER — Other Ambulatory Visit (HOSPITAL_COMMUNITY): Payer: Medicare Other

## 2017-04-24 ENCOUNTER — Ambulatory Visit (HOSPITAL_COMMUNITY): Payer: Medicare Other

## 2017-05-08 ENCOUNTER — Other Ambulatory Visit (HOSPITAL_COMMUNITY): Payer: Medicare Other

## 2017-05-08 ENCOUNTER — Ambulatory Visit (HOSPITAL_COMMUNITY): Payer: Medicare Other

## 2017-05-10 ENCOUNTER — Ambulatory Visit: Payer: Medicare Other | Admitting: Cardiovascular Disease

## 2017-05-10 DIAGNOSIS — R0989 Other specified symptoms and signs involving the circulatory and respiratory systems: Secondary | ICD-10-CM

## 2017-05-15 ENCOUNTER — Other Ambulatory Visit: Payer: Self-pay

## 2017-05-15 DIAGNOSIS — N189 Chronic kidney disease, unspecified: Secondary | ICD-10-CM

## 2017-05-22 ENCOUNTER — Ambulatory Visit (HOSPITAL_COMMUNITY): Payer: Medicare Other | Admitting: Internal Medicine

## 2017-05-22 ENCOUNTER — Other Ambulatory Visit (HOSPITAL_COMMUNITY): Payer: Medicare Other

## 2017-05-22 ENCOUNTER — Ambulatory Visit (HOSPITAL_COMMUNITY): Payer: Medicare Other

## 2017-06-08 IMAGING — DX DG CHEST 2V
2 series · 2 of 2 positions shown · non-contrast
Comparison: July 19, 2015

CLINICAL DATA: Wheezing and chest pain

EXAM:
CHEST  2 VIEW

[chest pa]
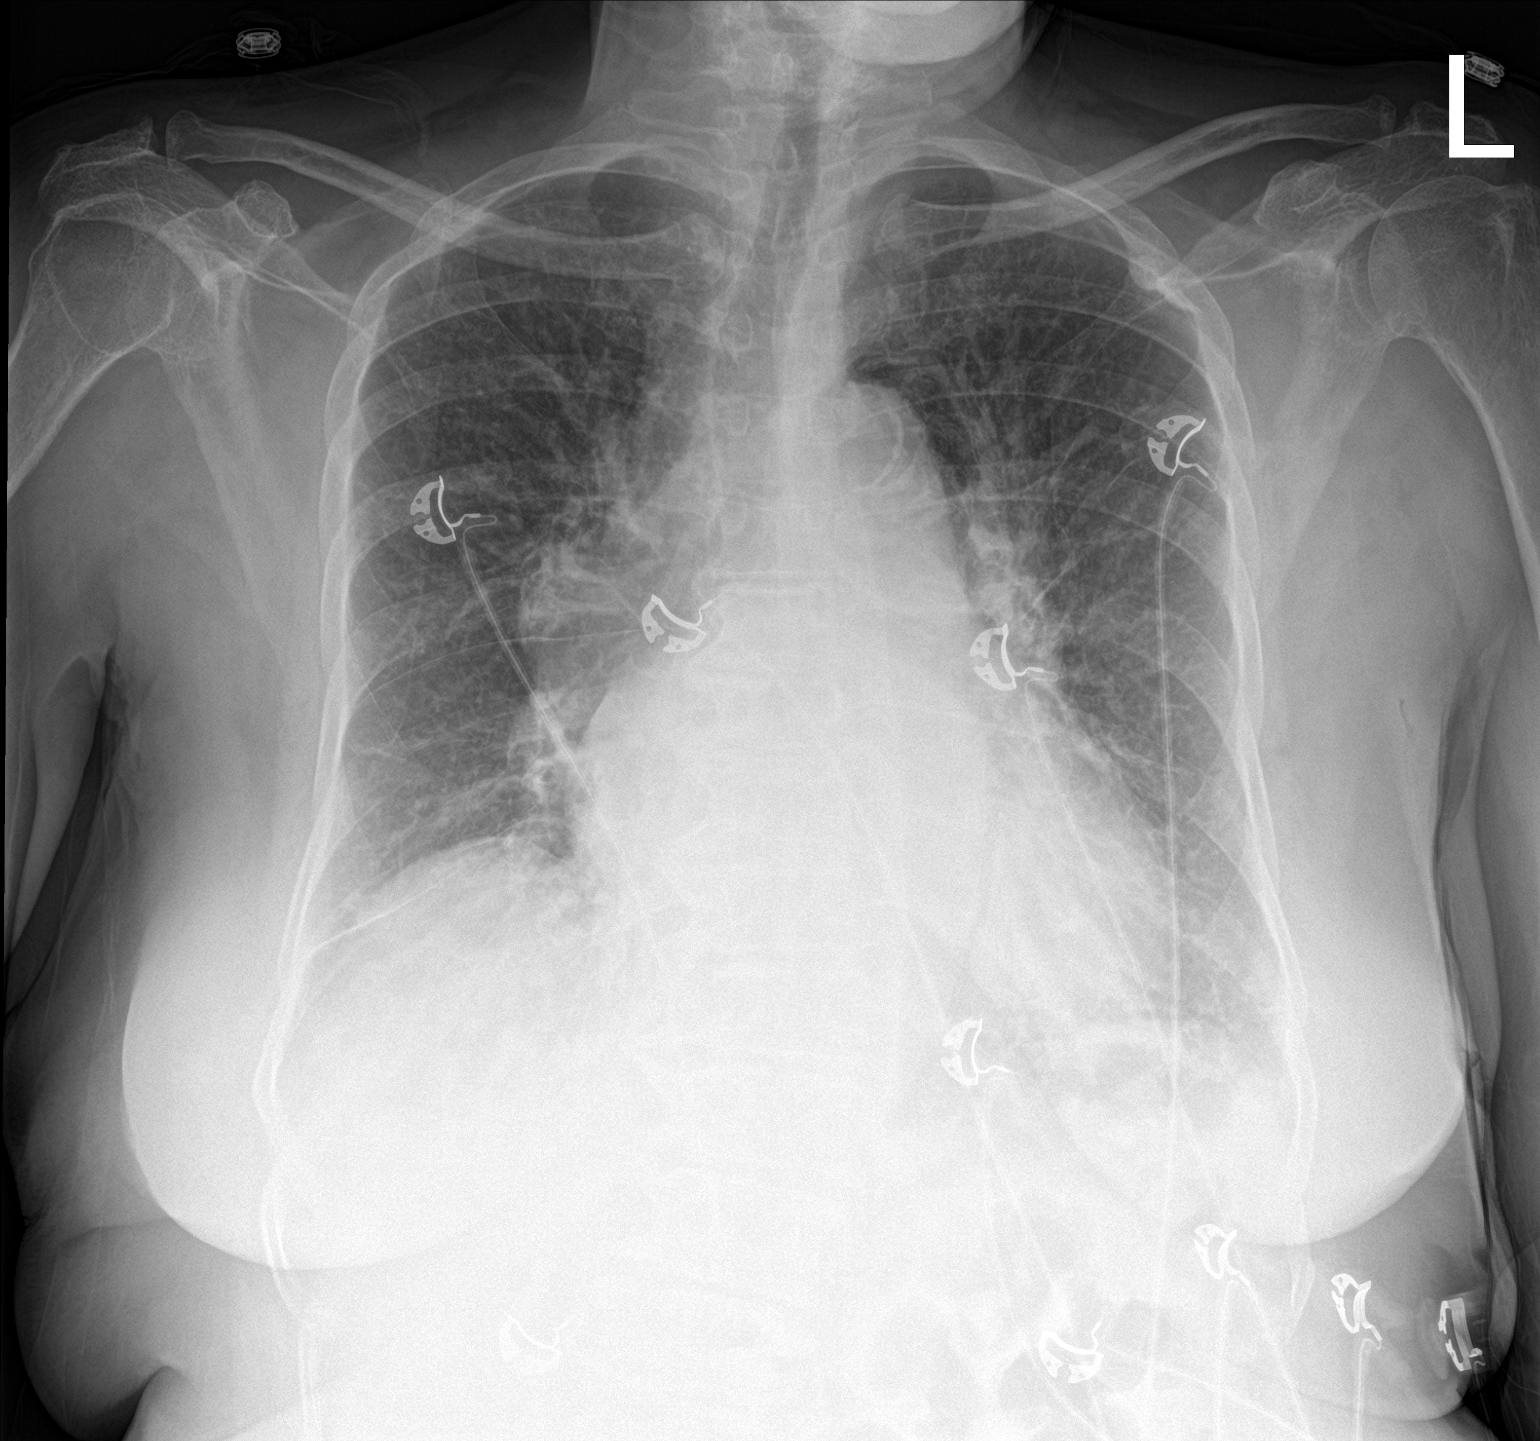

[chest lat]
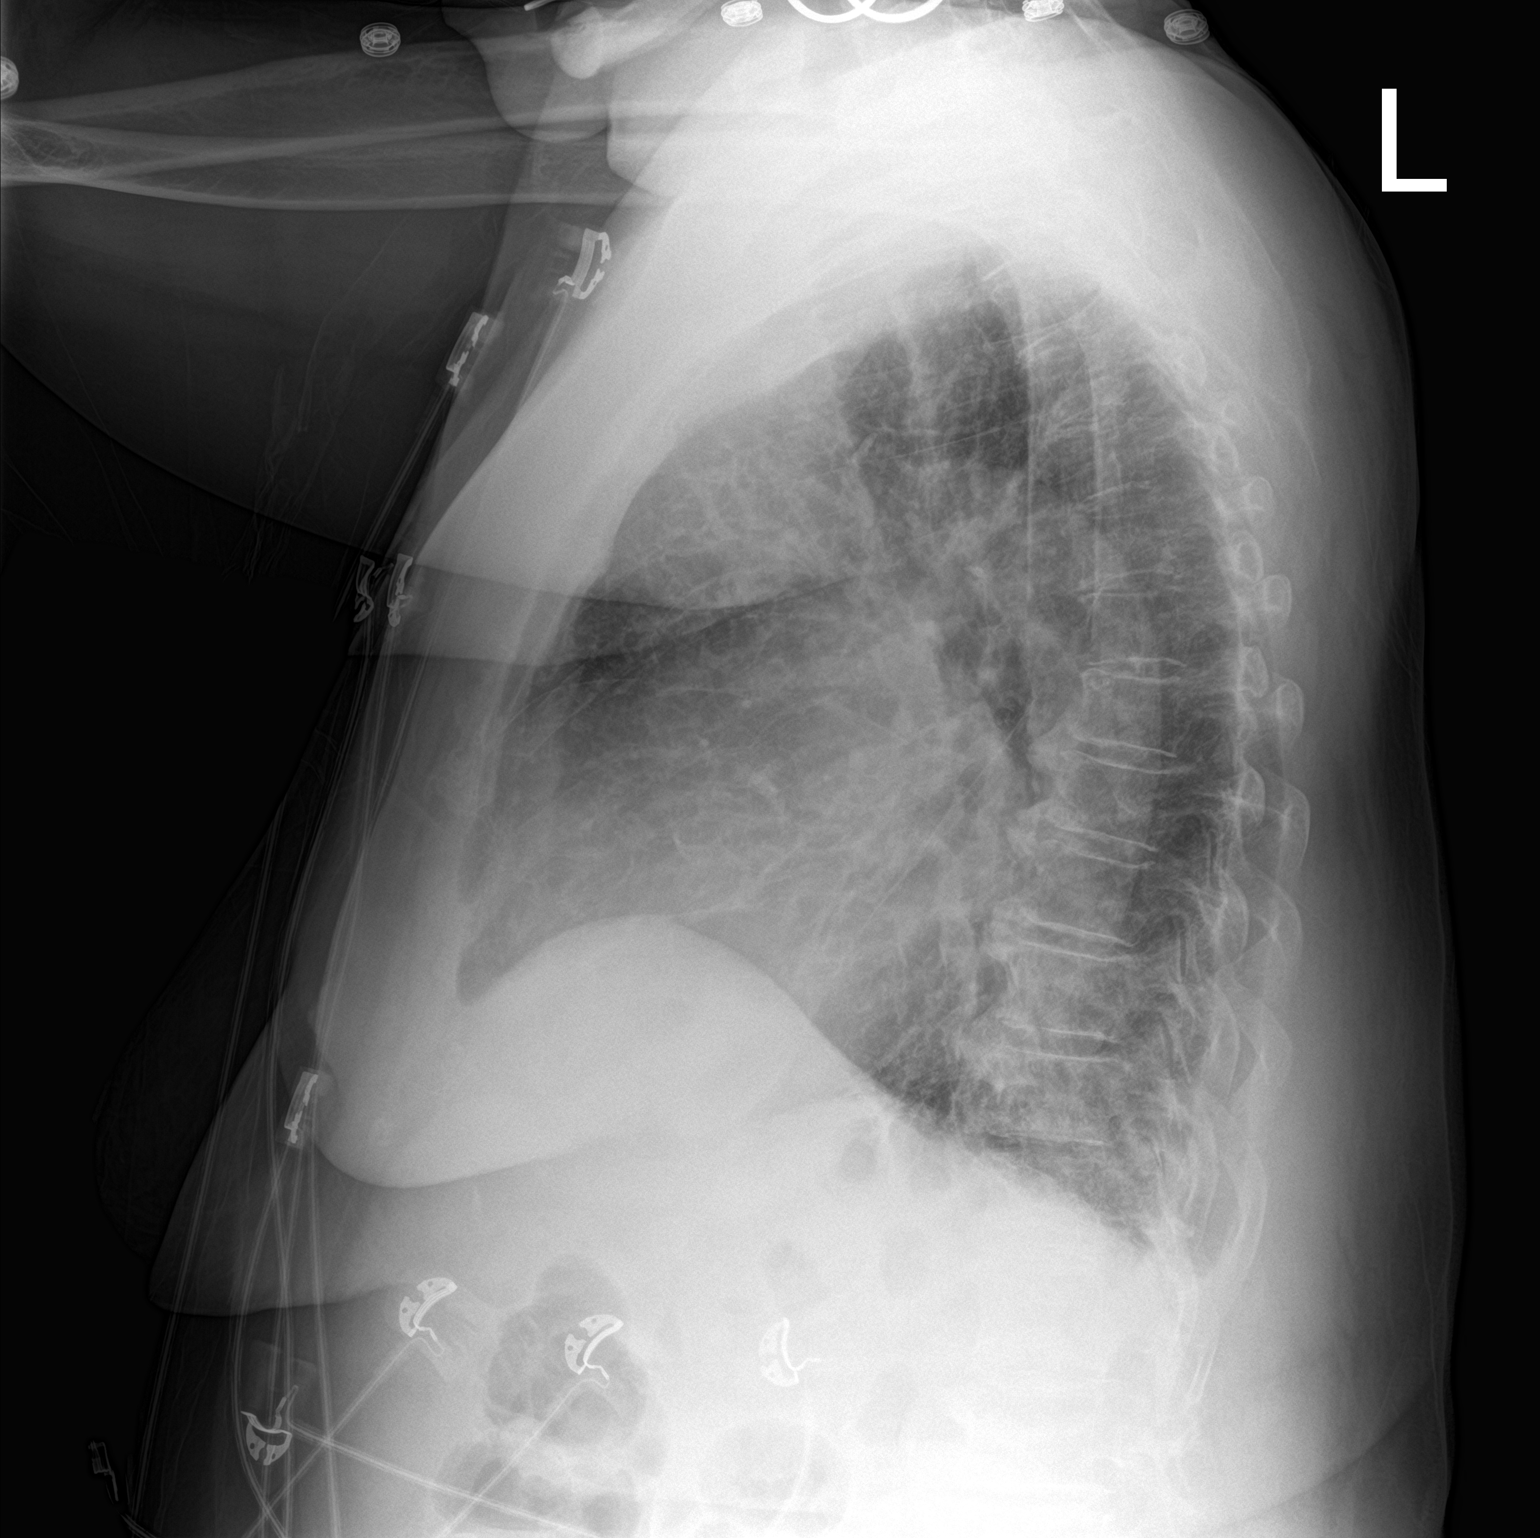

[2 of 2 positions shown; findings below may reference images not displayed]

FINDINGS: There is mild interstitial edema. There is no airspace consolidation
or appreciable pleural effusion. There is cardiomegaly with
pulmonary venous hypertension. There is atherosclerotic
calcification in the aortic arch. No adenopathy evident. There is
degenerative change in the thoracic spine. No pneumothorax.
IMPRESSION: Evidence of a degree of chronic congestive heart failure. No
airspace consolidation. No pneumothorax. There is aortic
atherosclerosis.

## 2017-06-20 ENCOUNTER — Other Ambulatory Visit: Payer: Self-pay | Admitting: *Deleted

## 2017-06-20 ENCOUNTER — Ambulatory Visit (INDEPENDENT_AMBULATORY_CARE_PROVIDER_SITE_OTHER)
Admission: RE | Admit: 2017-06-20 | Discharge: 2017-06-20 | Disposition: A | Discharge status: Home / Self Care | Payer: Medicare Other | Source: Ambulatory Visit | Attending: Vascular Surgery | Admitting: Vascular Surgery

## 2017-06-20 ENCOUNTER — Encounter: Payer: Self-pay | Admitting: *Deleted

## 2017-06-20 ENCOUNTER — Ambulatory Visit (INDEPENDENT_AMBULATORY_CARE_PROVIDER_SITE_OTHER): Payer: Medicare Other | Admitting: Vascular Surgery

## 2017-06-20 ENCOUNTER — Ambulatory Visit (HOSPITAL_COMMUNITY)
Admission: RE | Admit: 2017-06-20 | Discharge: 2017-06-20 | Disposition: A | Discharge status: Home / Self Care | Payer: Medicare Other | Source: Ambulatory Visit | Attending: Vascular Surgery | Admitting: Vascular Surgery

## 2017-06-20 ENCOUNTER — Encounter: Payer: Self-pay | Admitting: Vascular Surgery

## 2017-06-20 VITALS — BP 124/57 | HR 36 | Resp 18 | Ht 67.0 in | Wt 211.0 lb

## 2017-06-20 DIAGNOSIS — Z992 Dependence on renal dialysis: Secondary | ICD-10-CM

## 2017-06-20 DIAGNOSIS — E1122 Type 2 diabetes mellitus with diabetic chronic kidney disease: Secondary | ICD-10-CM | POA: Diagnosis not present

## 2017-06-20 DIAGNOSIS — I120 Hypertensive chronic kidney disease with stage 5 chronic kidney disease or end stage renal disease: Secondary | ICD-10-CM | POA: Insufficient documentation

## 2017-06-20 DIAGNOSIS — N186 End stage renal disease: Secondary | ICD-10-CM

## 2017-06-20 DIAGNOSIS — E785 Hyperlipidemia, unspecified: Secondary | ICD-10-CM | POA: Insufficient documentation

## 2017-06-20 DIAGNOSIS — N189 Chronic kidney disease, unspecified: Secondary | ICD-10-CM

## 2017-06-20 NOTE — Progress Notes (Signed)
Requested by:  Monico Blitz, MD [address] [place], [place] [address]  Reason for consultation: New access   History of Present Illness   Jody [name] is a 77 y.o. ([birth year]) female who presents for evaluation for permanent access.  The patient is right hand dominant.  The patient has not had previous access procedures.  Previous central venous cannulation procedures include: right internal jugular vein tunneled dialysis catheter.  The patient has not had a PPM placed.  She current undergoes HD: T-R-S.  Past Medical History:  Diagnosis Date  . Anemia in chronic kidney disease (CODE) 06/05/2015  . Arthritis    "bad in my legs" (12/07/2014)  . Atrial fibrillation ([place]) 06/05/2015  . Chronic back pain    "dr said my spine is crooked"  . Chronic bronchitis ([place])    "get it q yr" (12/07/2014)  . CKD stage 4 due to type 2 diabetes mellitus ([place]) 06/05/2015  . COPD (chronic obstructive pulmonary disease) ([employer])   . Gait difficulty 09/02/2014  . Heart disease   . Hypercholesterolemia   . Hypertension   . Neuropathy 2015   in legs  . Stroke ([place]) 05/2014   denies residual on 12/07/2014  . Stroke with cerebral ischemia ([place])   . Type II diabetes mellitus ([place])     Past Surgical History:  Procedure Laterality Date  . ABDOMINAL HYSTERECTOMY  ~ 1970  . APPENDECTOMY  ~ 1970  . JOINT REPLACEMENT    . TOTAL KNEE ARTHROPLASTY Right ~ 1986  . TUMOR EXCISION  ~ 1970   "9# in my stomach"    Social History   Socioeconomic History  . Marital status: Widowed    Spouse name: Not on file  . Number of children: 0  . Years of education: 9  . Highest education level: Not on file  Occupational History  . Occupation: retired  Scientific laboratory technician  . Financial resource strain: Not on file  . Food insecurity:    Worry: Not on file    Inability: Not on file  . Transportation needs:    Medical: Not on file    Non-medical: Not on file  Tobacco Use  . Smoking status: Former Smoker    Packs/day:  0.50    Years: 50.00    Pack years: 25.00    Types: Cigarettes    Start date: 03/07/1953    Last attempt to quit: 01/14/2004    Years since quitting: 13.4  . Smokeless tobacco: Never Used  . Tobacco comment: "quit smoking cigarettes  in ~ 2005"  Substance and Sexual Activity  . Alcohol use: No    Alcohol/week: 0.0 oz    Comment: 10/242016 "quit drinking beer in ~ 1977"  . Drug use: No  . Sexual activity: Never  Lifestyle  . Physical activity:    Days per week: Not on file    Minutes per session: Not on file  . Stress: Not on file  Relationships  . Social connections:    Talks on phone: Not on file    Gets together: Not on file    Attends religious service: Not on file    Active member of club or organization: Not on file    Attends meetings of clubs or organizations: Not on file    Relationship status: Not on file  . Intimate partner violence:    Fear of current or ex partner: Not on file    Emotionally abused: Not on file    Physically abused: Not  on file    Forced sexual activity: Not on file  Other Topics Concern  . Not on file  Social History Narrative   Patient drinks caffeine a few times a week.   Patient is right handed.    Family History  Problem Relation Age of Onset  . Cancer Other   . Heart attack Brother   . Cancer Sister        breast  . Alcohol abuse Brother     Current Outpatient Medications  Medication Sig Dispense Refill  . albuterol (PROVENTIL HFA;VENTOLIN HFA) 108 (90 BASE) MCG/ACT inhaler Inhale 1-2 puffs into the lungs every 6 (six) hours as needed for wheezing or shortness of breath. Reported on 03/01/2015    . amLODipine (NORVASC) 10 MG tablet Take 1 tablet (10 mg total) by mouth daily. 30 tablet 1  . aspirin EC 81 MG tablet Take 81 mg by mouth daily.    [name] atorvastatin (LIPITOR) 40 MG tablet Take 1 tablet by mouth daily.  0  . B-D UF III MINI PEN NEEDLES 31G X 5 MM MISC   0  . cilostazol (PLETAL) 50 MG tablet Take 50 mg by mouth 2 (two)  times daily.  0  . ELIQUIS 2.5 MG TABS tablet Take 1 tablet (2.5 mg total) by mouth 2 (two) times daily. 60 tablet 3  . fluticasone-salmeterol (ADVAIR HFA) 230-21 MCG/ACT inhaler Inhale 2 puffs into the lungs 2 (two) times daily.    . furosemide (LASIX) 40 MG tablet Take 1 tablet (40 mg total) by mouth daily. 60 tablet 3  . gabapentin (NEURONTIN) 400 MG capsule take 1 capsule by mouth three times a day  0  . hydrALAZINE (APRESOLINE) 100 MG tablet Take 1 tablet (100 mg total) by mouth 3 (three) times daily. 270 tablet 3  . Insulin Degludec (TRESIBA FLEXTOUCH) 100 UNIT/ML SOPN Inject 14 Units into the skin daily.     . metoprolol tartrate (LOPRESSOR) 25 MG tablet Take 0.5 tablets (12.5 mg total) by mouth 2 (two) times daily. 30 tablet 0  . ONE TOUCH ULTRA TEST test strip     . predniSONE (DELTASONE) 10 MG tablet Take 4 tablets (40 mg total) by mouth daily with breakfast. And decrease by one tablet daily 10 tablet 0  . sodium bicarbonate 650 MG tablet Take 1 tablet (650 mg total) by mouth 2 (two) times daily. 60 tablet 1  . sodium polystyrene (KAYEXALATE) powder take by mouth ONCE FOR 1 DOSE  0  . torsemide (DEMADEX) 20 MG tablet take 2 tablets by mouth once daily AT 8:00 AM  0   No current facility-administered medications for this visit.     Allergies  Allergen Reactions  . Codeine Nausea And Vomiting    REVIEW OF SYSTEMS (negative unless checked):   Cardiac:  []  Chest pain or chest pressure? []  Shortness of breath upon activity? []  Shortness of breath when lying flat? []  Irregular heart rhythm?  Vascular:  []  Pain in calf, thigh, or hip brought on by walking? []  Pain in feet at night that wakes you up from your sleep? []  Blood clot in your veins? [x]  Leg swelling?  Pulmonary:  []  Oxygen at home? []  Productive cough? []  Wheezing?  Neurologic:  []  Sudden weakness in arms or legs? []  Sudden numbness in arms or legs? []  Sudden onset of difficult speaking or slurred speech? []   Temporary loss of vision in one eye? []  Problems with dizziness?  Gastrointestinal:  []  Blood in stool? []   Vomited blood?  Genitourinary:  []  Burning when urinating? []  Blood in urine?  Psychiatric:  []  Major depression  Hematologic:  []  Bleeding problems? []  Problems with blood clotting?  Dermatologic:  []  Rashes or ulcers?  Constitutional:  []  Fever or chills?  Ear/Nose/Throat:  []  Change in hearing? []  Nose bleeds? []  Sore throat?  Musculoskeletal:  []  Back pain? [x]  Joint pain? []  Muscle pain?   Physical Examination     Vitals:   06/20/17 1206  BP: (!) 124/57  Pulse: (!) 36  Resp: 18  SpO2: 96%  Weight: 211 lb (95.7 kg)  Height: 5\' 7"  (1.702 m)   Body mass index is 33.05 kg/m.  General Alert, O x 3, WD, Elderly  Head [place]/AT,    Ear/Nose/ Throat Hearing grossly intact, nares without erythema or drainage, oropharynx without Erythema or Exudate, Mallampati score: 3,   Eyes PERRLA, EOMI,    Neck Supple, mid-line trachea,    Pulmonary Sym exp, good B air movt, CTA B  Cardiac RRR, Nl S1, S2, no Murmurs, No rubs, No S3,S4  Vascular Vessel Right Left  Radial Palpable Palpable  Brachial Palpable Palpable  Carotid Palpable, No Bruit Palpable, No Bruit  Aorta Not palpable N/A  Femoral Palpable Palpable  Popliteal Not palpable Not palpable  PT Not palpable Not palpable  DP Palpable Palpable    Gastro- intestinal soft, non-distended, non-tender to palpation, No guarding or rebound, no HSM, no masses, no CVAT B, No palpable prominent aortic pulse,    Musculo- skeletal M/S 5/5 throughout  , Extremities without ischemic changes  , Non-pitting edema present: B 1-2+, No visible varicosities , No Lipodermatosclerosis present  Neurologic Cranial nerves grossly intact, Pain and light touch intact in extremities except decreased in legs, Motor exam as listed above  Psychiatric Judgement intact, Mood & affect appropriate for pt's clinical situation  Dermatologic  See M/S exam for extremity exam, No rashes otherwise noted  Lymphatic  Palpable lymph nodes: None     Non-invasive Vascular Imaging   BUE Vein Mapping  (Date: 06/20/2017):   R arm: acceptable vein conduits include forearm cephalic, upper arm basilic  L arm: acceptable vein conduits include marginal basilic  BUE Doppler (Date: 06/20/2017):   R arm:   Brachial: bi, 4.8 mm  Radial: bi, 3.0 mm  Ulnar: bi, 2.0 mm  L arm:   Brachial: bi, 5.7 mm (high bifurcation)  Radial: bi, 2.7 mm  Ulnar: bi, 1.7 mm   Outside Studies/Documentation   10 pages of outside documents were reviewed including: outpatient nephrology charts and in patient chart fragments.   Medical Decision Making   Jody [name] is a 77 y.o. female who presents with end stage renal disease requiring HD   Based on vein mapping and examination, this patient's permanent access options include: R RC AVF vs staged R BVT.  I would first proceed with right radiocephalic arteriovenous fistula .  I had an extensive discussion with this patient in regards to the nature of access surgery, including risk, benefits, and alternatives.    The patient is aware that the risks of access surgery include but are not limited to: bleeding, infection, steal syndrome, nerve damage, ischemic monomelic neuropathy, failure of access to mature, complications related to venous hypertension, and possible need for additional access procedures in the future.  The patient has agreed to proceed with the above procedure which will be scheduled 17 MAY 19.   Adele Barthel, MD, FACS [facility] Office: [phone]  Pager: [phone]  06/20/2017, 12:41 PM

## 2017-06-21 ENCOUNTER — Telehealth: Payer: Self-pay | Admitting: *Deleted

## 2017-06-21 NOTE — Telephone Encounter (Signed)
Confirmed with CHRISTY at [facility]. She received the pre-op instruction letter( via fax) with new surgery date and will get to patient today when she is in for her HD.

## 2017-07-04 ENCOUNTER — Encounter (HOSPITAL_COMMUNITY): Payer: Self-pay | Admitting: *Deleted

## 2017-07-04 ENCOUNTER — Other Ambulatory Visit: Payer: Self-pay

## 2017-07-04 NOTE — Progress Notes (Signed)
Spoke with pt for pre-op call. Pt refused to go over meds with pharmacy tech, wanted to bring her list with her day of surgery. Pt can't tell me what meds she is taking. She does state that she is taking Eliquis for A-fib. Dr. Lianne Moris office was not aware of this. I called and spoke with Jacqlyn Larsen, RN and she states if pt doesn't take Eliquis tonight and tomorrow, surgery can be done as scheduled. I spoke with pt again and told her not to take Eliquis tonight and none for tomorrow. Pt voiced understanding. Pt denies any recent chest pain. States she thinks she had a heart attack 3 or 4 years ago. Pt not a very good historian. Pt very confused about her medications. I told her I would call the nurse at [facility] in [place] tomorrow and have her go over medications with pt. I will also need to ask the nurse if pt followed up with a cardiologist after being in the hospital at [facility] in March.

## 2017-07-05 ENCOUNTER — Telehealth: Payer: Self-pay | Admitting: *Deleted

## 2017-07-05 ENCOUNTER — Encounter (HOSPITAL_COMMUNITY): Payer: Self-pay | Admitting: *Deleted

## 2017-07-05 NOTE — Progress Notes (Signed)
Faxed med sheet that I received from [place] [facility] to [facility] in the Pharmacy call center.

## 2017-07-05 NOTE — Telephone Encounter (Signed)
Discussed Eliquis hold with [name] before informing [name] at [employer]. That patient OK for surgery with hold of 2 days. In office patient stated she was not taking Eliquis; however, she now tells Helene Kelp that she takes Eliquis on non-dialysis days only. Over the past week, I have left several messages for Ms. [name] to call this office to review arrival time to hospital . She has never returned a call. Call to [facility]([place]) and reviewed instructions and to be at hospital as close to 9 am as possible for surgery.

## 2017-07-05 NOTE — Progress Notes (Signed)
Anesthesia Chart Review:  Pt is a same day work up   Case:  [id number] Date/Time:  07/06/17 1020   Procedure:  RIGHT RADIOCEPHALIC ARTERIOVENOUS FISTULA VERSUS BASILIC VEIN TRANSPOSITION RIGHT ARM (Right )   Anesthesia type:  Monitor Anesthesia Care   Pre-op diagnosis:  END STAGE RENAL DISEASE FOR HEMODIALYSIS ACCESS   Location:  [facility] OR ROOM 40 / [place] OR   Surgeon:  Conrad  Junction, MD      DISCUSSION:  - Pt is a 77 year old female with hx PAF on eliquis. Last dose eliquis 07/04/17 - Hx chronic diastolic HF, malignant HTN  - Hospitalized 3/8-3/15/19 at [facility] for acute CHF (with preserved EF) exacerbation in the setting of CKD progressing to ESRD  - Pt to continue pletal and ASA perioperatively   PROVIDERS: PCP is Monico Blitz, MD   Patient Care Team: Fran Lowes, MD as Consulting Physician (Nephrology) Cardiologist is Kate Sable, MD.  Last office visit 10/12/16.  6 week f/u recommended for HTN but did not happen.    LABS: Will be obtained day of surgery   IMAGES:  1 view CXR 04/22/17 (care everywhere):  1.Interval placement of right IJ approach central venous catheter with tip projecting over the lower SVC. No pneumothorax. 2.No other significant changes compared to the prior chest radiograph with persistent bibasilar opacities likely representing atelectasis although infection/aspiration cannot be excluded.   EKG 10/15/16: Sinus or ectopic atrial bradycardia. Nonspecific T abnormalities, lateral leads    CV:  Echo 04/23/17 (care everywhere):  - LV size normal. Mild concentric LVH. LV systolic function is normal. LV ejection fraction = 55-60%. - RV normal size. RV systolic function is normal. - LA is mildly dilated. - Aortic valve sclerosis. No significant stenosis or regurgitation seen - There was insufficient TR detected to calculate RV systolic pressure. - Estimated right atrial pressure is 10 mmHg.. - There is no pericardial effusion.  Nuclear stress test  12/10/14:   T wave inversion was noted during stress in the V4, V5 and V6 leads, beginning at 1 minutes of stress, ending at 7 minutes of stress, and returning to baseline after 5-9 mins of recovery.  ST segment depression was noted during stress in the V4, V5, V6 and II leads, beginning at 1 minutes of stress, ending at 7 minutes of stress.  Defect 1: There is a medium defect of moderate severity present in the basal inferoseptal, basal inferior and mid inferior location.  The left ventricular ejection fraction is mildly decreased (45-54%).  This is a low risk study.  Significant diaphragmatic attenuation and extracardiac uptake, but cannot rule out ischemia in the basal to mid inferior and inferoseptal walls. Wall motion is normal, so favor artifact   Past Medical History:  Diagnosis Date  . Anemia in chronic kidney disease (CODE) 06/05/2015  . Arthritis    "bad in my legs" (12/07/2014)  . Atrial fibrillation ([place]) 06/05/2015  . Chronic back pain    "dr said my spine is crooked"  . Chronic bronchitis ([place])    "get it q yr" (12/07/2014)  . CKD stage 4 due to type 2 diabetes mellitus ([place]) 06/05/2015  . COPD (chronic obstructive pulmonary disease) ([place])   . Gait difficulty 09/02/2014  . Heart disease   . Hypercholesterolemia   . Hypertension   . Neuropathy 2015   in legs  . Stroke ([place]) 05/2014   denies residual on 12/07/2014  . Stroke with cerebral ischemia ([place])   . Type II diabetes mellitus (  [place])    - Hospitalized 3/8-3/15/19 at [facility] for acute CHF (with preserved EF) exacerbation in the setting of CKD progressing to ESRD   Past Surgical History:  Procedure Laterality Date  . ABDOMINAL HYSTERECTOMY  ~ 1970  . APPENDECTOMY  ~ 1970  . JOINT REPLACEMENT    . TOTAL KNEE ARTHROPLASTY Right ~ 1986  . TUMOR EXCISION  ~ 1970   "9# in my stomach"    MEDICATIONS: No current facility-administered medications for this encounter.    [name] albuterol (PROVENTIL HFA;VENTOLIN HFA) 108  (90 BASE) MCG/ACT inhaler  . amLODipine (NORVASC) 10 MG tablet  . aspirin EC 81 MG tablet  . atorvastatin (LIPITOR) 40 MG tablet  . B-D UF III MINI PEN NEEDLES 31G X 5 MM MISC  . cilostazol (PLETAL) 50 MG tablet  . ELIQUIS 2.5 MG TABS tablet  . fluticasone-salmeterol (ADVAIR HFA) 230-21 MCG/ACT inhaler  . furosemide (LASIX) 40 MG tablet  . gabapentin (NEURONTIN) 400 MG capsule  . hydrALAZINE (APRESOLINE) 100 MG tablet  . Insulin Degludec (TRESIBA FLEXTOUCH) 100 UNIT/ML SOPN  . metoprolol tartrate (LOPRESSOR) 25 MG tablet  . ONE TOUCH ULTRA TEST test strip  . predniSONE (DELTASONE) 10 MG tablet  . sodium bicarbonate 650 MG tablet  . sodium polystyrene (KAYEXALATE) powder  . torsemide (DEMADEX) 20 MG tablet   If no active CV symptoms and labs acceptable day of surgery, I anticipate pt can proceed with surgery as scheduled.  Willeen Cass, FNP-BC [facility]/Anesthesiology Phone: [phone] 07/05/2017 11:18 AM

## 2017-07-05 NOTE — Progress Notes (Signed)
[facility] in [place] and spoke with Ronny Bacon, [place]. She was able to update pt's medications for me (she is faxing the list). I asked her if she would please give Jody [name] the list of medications that she needs to take tomorrow morning prior to surgery - the list includes Aspirin, Pletal, Cardizem, Gabapentin, Hydralazine, Inderal, Albuterol inhaler - if needed, Advair inhaler (if she still uses this one). Also asked Ronny Bacon to make sure pt understands not to take her Eliquis today or tomorrow morning. Ronny Bacon states pt does not have any diabetic meds on her med list now.  Pt's last A1C was 4.7 on 04/22/17. Pt stated yesterday when I spoke with her that her fasting blood sugars were usually between 113-130. Ronny Bacon states she is not sure pt is actually checking her blood sugar at home.

## 2017-07-06 ENCOUNTER — Ambulatory Visit (HOSPITAL_COMMUNITY)
Admission: RE | Admit: 2017-07-06 | Discharge: 2017-07-06 | Disposition: A | Discharge status: Home / Self Care | Payer: Medicare Other | Source: Ambulatory Visit | Attending: Vascular Surgery | Admitting: Vascular Surgery

## 2017-07-06 ENCOUNTER — Encounter (HOSPITAL_COMMUNITY)
Admission: RE | Disposition: A | Discharge status: Home / Self Care | Payer: Self-pay | Source: Ambulatory Visit | Attending: Vascular Surgery

## 2017-07-06 ENCOUNTER — Ambulatory Visit (HOSPITAL_COMMUNITY): Payer: Medicare Other | Admitting: Emergency Medicine

## 2017-07-06 ENCOUNTER — Encounter (HOSPITAL_COMMUNITY): Payer: Self-pay | Admitting: *Deleted

## 2017-07-06 DIAGNOSIS — J449 Chronic obstructive pulmonary disease, unspecified: Secondary | ICD-10-CM | POA: Diagnosis not present

## 2017-07-06 DIAGNOSIS — Z7952 Long term (current) use of systemic steroids: Secondary | ICD-10-CM | POA: Diagnosis not present

## 2017-07-06 DIAGNOSIS — Z7982 Long term (current) use of aspirin: Secondary | ICD-10-CM | POA: Insufficient documentation

## 2017-07-06 DIAGNOSIS — N186 End stage renal disease: Secondary | ICD-10-CM | POA: Diagnosis not present

## 2017-07-06 DIAGNOSIS — Z7951 Long term (current) use of inhaled steroids: Secondary | ICD-10-CM | POA: Diagnosis not present

## 2017-07-06 DIAGNOSIS — I4891 Unspecified atrial fibrillation: Secondary | ICD-10-CM | POA: Insufficient documentation

## 2017-07-06 DIAGNOSIS — Z992 Dependence on renal dialysis: Secondary | ICD-10-CM | POA: Insufficient documentation

## 2017-07-06 DIAGNOSIS — E7800 Pure hypercholesterolemia, unspecified: Secondary | ICD-10-CM | POA: Diagnosis not present

## 2017-07-06 DIAGNOSIS — Z7901 Long term (current) use of anticoagulants: Secondary | ICD-10-CM | POA: Diagnosis not present

## 2017-07-06 DIAGNOSIS — Z8673 Personal history of transient ischemic attack (TIA), and cerebral infarction without residual deficits: Secondary | ICD-10-CM | POA: Diagnosis not present

## 2017-07-06 DIAGNOSIS — E1122 Type 2 diabetes mellitus with diabetic chronic kidney disease: Secondary | ICD-10-CM | POA: Insufficient documentation

## 2017-07-06 DIAGNOSIS — D631 Anemia in chronic kidney disease: Secondary | ICD-10-CM | POA: Diagnosis not present

## 2017-07-06 DIAGNOSIS — E1140 Type 2 diabetes mellitus with diabetic neuropathy, unspecified: Secondary | ICD-10-CM | POA: Insufficient documentation

## 2017-07-06 DIAGNOSIS — Z79899 Other long term (current) drug therapy: Secondary | ICD-10-CM | POA: Insufficient documentation

## 2017-07-06 DIAGNOSIS — Z794 Long term (current) use of insulin: Secondary | ICD-10-CM | POA: Diagnosis not present

## 2017-07-06 DIAGNOSIS — I132 Hypertensive heart and chronic kidney disease with heart failure and with stage 5 chronic kidney disease, or end stage renal disease: Secondary | ICD-10-CM | POA: Diagnosis not present

## 2017-07-06 DIAGNOSIS — Z87891 Personal history of nicotine dependence: Secondary | ICD-10-CM | POA: Diagnosis not present

## 2017-07-06 DIAGNOSIS — I5032 Chronic diastolic (congestive) heart failure: Secondary | ICD-10-CM | POA: Insufficient documentation

## 2017-07-06 DIAGNOSIS — N185 Chronic kidney disease, stage 5: Secondary | ICD-10-CM | POA: Diagnosis not present

## 2017-07-06 DIAGNOSIS — Z96651 Presence of right artificial knee joint: Secondary | ICD-10-CM | POA: Insufficient documentation

## 2017-07-06 HISTORY — PX: AV FISTULA PLACEMENT: SHX1204

## 2017-07-06 LAB — GLUCOSE, CAPILLARY
Glucose-Capillary: 109 mg/dL — ABNORMAL HIGH (ref 65–99)
Glucose-Capillary: 98 mg/dL (ref 65–99)

## 2017-07-06 LAB — PROTIME-INR
INR: 1.22
Prothrombin Time: 15.3 seconds — ABNORMAL HIGH (ref 11.4–15.2)

## 2017-07-06 LAB — POCT I-STAT 4, (NA,K, GLUC, HGB,HCT)
Glucose, Bld: 124 mg/dL — ABNORMAL HIGH (ref 65–99)
HEMATOCRIT: 26 % — AB (ref 36.0–46.0)
HEMOGLOBIN: 8.8 g/dL — AB (ref 12.0–15.0)
Potassium: 3.3 mmol/L — ABNORMAL LOW (ref 3.5–5.1)
Sodium: 141 mmol/L (ref 135–145)

## 2017-07-06 SURGERY — ARTERIOVENOUS (AV) FISTULA CREATION
Anesthesia: General | Site: Arm Lower | Laterality: Right

## 2017-07-06 MED ORDER — CHLORHEXIDINE GLUCONATE 4 % EX LIQD: 60.0000 mL | Freq: Once | CUTANEOUS | Status: DC

## 2017-07-06 MED ORDER — LIDOCAINE 2% (20 MG/ML) 5 ML SYRINGE
INTRAMUSCULAR | Status: AC
  Filled 2017-07-06: qty 5

## 2017-07-06 MED ORDER — OXYCODONE-ACETAMINOPHEN 5-325 MG PO TABS
1.0000 | ORAL_TABLET | Freq: Once | ORAL | Status: AC
  Administered 2017-07-06: 1 via ORAL

## 2017-07-06 MED ORDER — 0.9 % SODIUM CHLORIDE (POUR BTL) OPTIME
TOPICAL | Status: DC | PRN
  Administered 2017-07-06: 1000 mL

## 2017-07-06 MED ORDER — ONDANSETRON HCL 4 MG/2ML IJ SOLN: 4.0000 mg | Freq: Once | INTRAMUSCULAR | Status: DC | PRN

## 2017-07-06 MED ORDER — OXYCODONE-ACETAMINOPHEN 5-325 MG PO TABS
ORAL_TABLET | ORAL | Status: AC
  Filled 2017-07-06: qty 1

## 2017-07-06 MED ORDER — DEXAMETHASONE SODIUM PHOSPHATE 10 MG/ML IJ SOLN
INTRAMUSCULAR | Status: AC
  Filled 2017-07-06: qty 1

## 2017-07-06 MED ORDER — ONDANSETRON HCL 4 MG/2ML IJ SOLN
INTRAMUSCULAR | Status: AC
  Filled 2017-07-06: qty 2

## 2017-07-06 MED ORDER — ONDANSETRON HCL 4 MG/2ML IJ SOLN
INTRAMUSCULAR | Status: DC | PRN
  Administered 2017-07-06: 4 mg via INTRAVENOUS

## 2017-07-06 MED ORDER — EPHEDRINE SULFATE 50 MG/ML IJ SOLN
INTRAMUSCULAR | Status: DC | PRN
  Administered 2017-07-06: 5 mg via INTRAVENOUS
  Administered 2017-07-06 (×2): 2.5 mg via INTRAVENOUS

## 2017-07-06 MED ORDER — FENTANYL CITRATE (PF) 100 MCG/2ML IJ SOLN: 25.0000 ug | INTRAMUSCULAR | Status: DC | PRN

## 2017-07-06 MED ORDER — SODIUM CHLORIDE 0.9 % IV SOLN
INTRAVENOUS | Status: DC | PRN
  Administered 2017-07-06: 12:00:00

## 2017-07-06 MED ORDER — DEXAMETHASONE SODIUM PHOSPHATE 10 MG/ML IJ SOLN
INTRAMUSCULAR | Status: DC | PRN
  Administered 2017-07-06: 10 mg via INTRAVENOUS

## 2017-07-06 MED ORDER — FENTANYL CITRATE (PF) 100 MCG/2ML IJ SOLN
INTRAMUSCULAR | Status: DC | PRN
  Administered 2017-07-06: 25 ug via INTRAVENOUS
  Administered 2017-07-06: 50 ug via INTRAVENOUS
  Administered 2017-07-06: 25 ug via INTRAVENOUS

## 2017-07-06 MED ORDER — LIDOCAINE HCL (PF) 1 % IJ SOLN
INTRAMUSCULAR | Status: AC
  Filled 2017-07-06: qty 30

## 2017-07-06 MED ORDER — OXYCODONE-ACETAMINOPHEN 5-325 MG PO TABS: 1.0000 | ORAL_TABLET | Freq: Four times a day (QID) | ORAL | 0 refills | Status: DC | PRN

## 2017-07-06 MED ORDER — PROPOFOL 10 MG/ML IV BOLUS
INTRAVENOUS | Status: AC
  Filled 2017-07-06: qty 20

## 2017-07-06 MED ORDER — LIDOCAINE HCL (PF) 1 % IJ SOLN
INTRAMUSCULAR | Status: DC | PRN
  Administered 2017-07-06: 6 mL via INTRADERMAL

## 2017-07-06 MED ORDER — PROPOFOL 10 MG/ML IV BOLUS
INTRAVENOUS | Status: DC | PRN
  Administered 2017-07-06: 130 mg via INTRAVENOUS

## 2017-07-06 MED ORDER — GLYCOPYRROLATE 0.2 MG/ML IJ SOLN
INTRAMUSCULAR | Status: DC | PRN
  Administered 2017-07-06: 0.2 mg via INTRAVENOUS

## 2017-07-06 MED ORDER — CEFAZOLIN SODIUM-DEXTROSE 2-4 GM/100ML-% IV SOLN
2.0000 g | INTRAVENOUS | Status: AC
  Administered 2017-07-06: 2 g via INTRAVENOUS
  Filled 2017-07-06: qty 100

## 2017-07-06 MED ORDER — SODIUM CHLORIDE 0.9 % IV SOLN
INTRAVENOUS | Status: DC
  Administered 2017-07-06: 10:00:00 via INTRAVENOUS

## 2017-07-06 MED ORDER — LIDOCAINE HCL (CARDIAC) PF 100 MG/5ML IV SOSY
PREFILLED_SYRINGE | INTRAVENOUS | Status: DC | PRN
  Administered 2017-07-06: 30 mg via INTRAVENOUS

## 2017-07-06 MED ORDER — FENTANYL CITRATE (PF) 250 MCG/5ML IJ SOLN
INTRAMUSCULAR | Status: AC
  Filled 2017-07-06: qty 5

## 2017-07-06 MED ORDER — PHENYLEPHRINE HCL 10 MG/ML IJ SOLN: INTRAMUSCULAR | Status: DC | PRN

## 2017-07-06 MED ORDER — SODIUM CHLORIDE 0.9 % IV SOLN
INTRAVENOUS | Status: AC
  Filled 2017-07-06: qty 1.2

## 2017-07-06 MED ORDER — HEPARIN SODIUM (PORCINE) 1000 UNIT/ML IJ SOLN
INTRAMUSCULAR | Status: AC
  Filled 2017-07-06: qty 1

## 2017-07-06 SURGICAL SUPPLY — 31 items
ARMBAND PINK RESTRICT EXTREMIT (MISCELLANEOUS) ×6 IMPLANT
CANISTER SUCT 3000ML PPV (MISCELLANEOUS) ×3 IMPLANT
CLIP VESOCCLUDE MED 6/CT (CLIP) ×3 IMPLANT
CLIP VESOCCLUDE SM WIDE 6/CT (CLIP) ×3 IMPLANT
COVER PROBE W GEL 5X96 (DRAPES) ×3 IMPLANT
DECANTER SPIKE VIAL GLASS SM (MISCELLANEOUS) ×3 IMPLANT
DERMABOND ADHESIVE PROPEN (GAUZE/BANDAGES/DRESSINGS) ×2
DERMABOND ADVANCED (GAUZE/BANDAGES/DRESSINGS) ×2
DERMABOND ADVANCED .7 DNX12 (GAUZE/BANDAGES/DRESSINGS) ×1 IMPLANT
DERMABOND ADVANCED .7 DNX6 (GAUZE/BANDAGES/DRESSINGS) ×1 IMPLANT
ELECT REM PT RETURN 9FT ADLT (ELECTROSURGICAL) ×3
ELECTRODE REM PT RTRN 9FT ADLT (ELECTROSURGICAL) ×1 IMPLANT
GLOVE BIO SURGEON STRL SZ7 (GLOVE) ×3 IMPLANT
GLOVE BIOGEL PI IND STRL 7.5 (GLOVE) ×1 IMPLANT
GLOVE BIOGEL PI INDICATOR 7.5 (GLOVE) ×2
GOWN STRL REUS W/ TWL LRG LVL3 (GOWN DISPOSABLE) ×3 IMPLANT
GOWN STRL REUS W/TWL LRG LVL3 (GOWN DISPOSABLE) ×6
HEMOSTAT SPONGE AVITENE ULTRA (HEMOSTASIS) IMPLANT
KIT BASIN OR (CUSTOM PROCEDURE TRAY) ×3 IMPLANT
KIT TURNOVER KIT B (KITS) ×3 IMPLANT
NS IRRIG 1000ML POUR BTL (IV SOLUTION) ×3 IMPLANT
PACK CV ACCESS (CUSTOM PROCEDURE TRAY) ×3 IMPLANT
PAD ARMBOARD 7.5X6 YLW CONV (MISCELLANEOUS) ×6 IMPLANT
SUT MNCRL AB 4-0 PS2 18 (SUTURE) ×3 IMPLANT
SUT PROLENE 6 0 BV (SUTURE) IMPLANT
SUT PROLENE 7 0 BV 1 (SUTURE) ×6 IMPLANT
SUT VIC AB 3-0 SH 27 (SUTURE) ×2
SUT VIC AB 3-0 SH 27X BRD (SUTURE) ×1 IMPLANT
TOWEL GREEN STERILE (TOWEL DISPOSABLE) ×3 IMPLANT
UNDERPAD 30X30 (UNDERPADS AND DIAPERS) ×3 IMPLANT
WATER STERILE IRR 1000ML POUR (IV SOLUTION) ×3 IMPLANT

## 2017-07-06 NOTE — Anesthesia Postprocedure Evaluation (Signed)
Anesthesia Post Note  Patient: Jody [name]  Procedure(s) Performed: RIGHT RADIOCEPHALIC ARTERIOVENOUS FISTULA creation (Right Arm Lower)     Patient location during evaluation: PACU Anesthesia Type: General Level of consciousness: awake and alert Pain management: pain level controlled Vital Signs Assessment: post-procedure vital signs reviewed and stable Respiratory status: spontaneous breathing, nonlabored ventilation, respiratory function stable and patient connected to nasal cannula oxygen Cardiovascular status: blood pressure returned to baseline and stable Postop Assessment: no apparent nausea or vomiting Anesthetic complications: no    Last Vitals:  Vitals:   07/06/17 1332 07/06/17 1345  BP: (!) 183/108   Pulse: 71   Resp: (!) 21   Temp:  36.4 C  SpO2: 95%     Last Pain:  Vitals:   07/06/17 1304  TempSrc:   PainSc: 0-No pain                 Jody [name]

## 2017-07-06 NOTE — H&P (Addendum)
Brief History and Physical  History of Present Illness   Jody [name] is a 77 y.o. female who presents with chief complaint: ESRD.  The patient presents today for placement of R arm fistula: RC vs BVT.  Pt current HD via RIJV TDC.  Past Medical History:  Diagnosis Date  . Anemia in chronic kidney disease (CODE) 06/05/2015  . Arthritis    "bad in my legs" (12/07/2014)  . Atrial fibrillation ([place]) 06/05/2015  . Chronic back pain    "dr said my spine is crooked"  . Chronic bronchitis ([place])    "get it q yr" (12/07/2014)  . CKD stage 4 due to type 2 diabetes mellitus ([place]) 06/05/2015  . COPD (chronic obstructive pulmonary disease) ([place])   . Gait difficulty 09/02/2014  . Heart disease   . Hypercholesterolemia   . Hypertension   . Neuropathy 2015   in legs  . Stroke ([place]) 05/2014   denies residual on 12/07/2014  . Stroke with cerebral ischemia ([place])   . Type II diabetes mellitus ([place])     Past Surgical History:  Procedure Laterality Date  . ABDOMINAL HYSTERECTOMY  ~ 1970  . APPENDECTOMY  ~ 1970  . JOINT REPLACEMENT    . TOTAL KNEE ARTHROPLASTY Right ~ 1986  . TUMOR EXCISION  ~ 1970   "9# in my stomach"    Social History   Socioeconomic History  . Marital status: Widowed    Spouse name: Not on file  . Number of children: 0  . Years of education: 9  . Highest education level: Not on file  Occupational History  . Occupation: retired  Scientific laboratory technician  . Financial resource strain: Not on file  . Food insecurity:    Worry: Not on file    Inability: Not on file  . Transportation needs:    Medical: Not on file    Non-medical: Not on file  Tobacco Use  . Smoking status: Former Smoker    Packs/day: 0.50    Years: 50.00    Pack years: 25.00    Types: Cigarettes    Start date: 03/07/1953    Last attempt to quit: 01/14/2004    Years since quitting: 13.4  . Smokeless tobacco: Never Used  . Tobacco comment: "quit smoking cigarettes  in ~ 2005"  Substance and Sexual  Activity  . Alcohol use: No    Alcohol/week: 0.0 oz    Comment: 10/242016 "quit drinking beer in ~ 1977"  . Drug use: No  . Sexual activity: Never  Lifestyle  . Physical activity:    Days per week: Not on file    Minutes per session: Not on file  . Stress: Not on file  Relationships  . Social connections:    Talks on phone: Not on file    Gets together: Not on file    Attends religious service: Not on file    Active member of club or organization: Not on file    Attends meetings of clubs or organizations: Not on file    Relationship status: Not on file  . Intimate partner violence:    Fear of current or ex partner: Not on file    Emotionally abused: Not on file    Physically abused: Not on file    Forced sexual activity: Not on file  Other Topics Concern  . Not on file  Social History Narrative   Patient drinks caffeine a few times a week.   Patient is right handed.  Family History  Problem Relation Age of Onset  . Cancer Other   . Heart attack Brother   . Cancer Sister        breast  . Alcohol abuse Brother     Current Facility-Administered Medications  Medication Dose Route Frequency Provider Last Rate Last Dose  . 0.9 %  sodium chloride infusion   Intravenous Continuous Conrad [place], MD 10 mL/hr at 07/06/17 1018    . ceFAZolin (ANCEF) IVPB 2g/100 mL premix  2 g Intravenous 30 min Pre-Op Conrad [place], MD      . chlorhexidine (HIBICLENS) 4 % liquid 4 application  60 mL Topical Once Conrad [place], MD       And  . Derrill Memo ON 07/07/2017] chlorhexidine (HIBICLENS) 4 % liquid 4 application  60 mL Topical Once Conrad [place], MD        Allergies  Allergen Reactions  . Codeine Nausea And Vomiting    Review of Systems: As listed above, otherwise negative.   Physical Examination   Vitals:   07/06/17 1002 07/06/17 1020  BP:  (!) 172/73  Pulse: (!) 50   Resp: 18   Temp:  98.2 F (36.8 C)  TempSrc:  Oral  SpO2: 100%   Weight: 211 lb (95.7 kg)   Height: 5\' 7"   (1.702 m)    Body mass index is 33.05 kg/m.  General Alert, O x 3, WD, NAD  Pulmonary Sym exp, good B air movt, CTA B  Cardiac RRR, Nl S1, S2, no Murmurs, No rubs, No S3,S4  Musculo- skeletal R arm with intact palpable radial and brachial pulses, no wounds noted  Neurologic Pain and light touch intact in extremities ,     Laboratory  See iStat   Medical Decision Making   Jody [name] is a 77 y.o. female who presents with: ESRD.   The patient is scheduled for: R RC vs BVT placement.  Pt expressed a preference for R BVT placement today.  Risk, benefits, and alternatives to access surgery were discussed.  The patient is aware the risks include but are not limited to: bleeding, infection, steal syndrome, nerve damage, ischemic monomelic neuropathy, failure to mature, and need for additional procedures.  I discussed with the patient that I complete transposition procedures in a staged fashion, which requires another operation.  The patient is aware of the risks and agrees to proceed.   Adele Barthel, MD, FACS [facility] Office: [phone] Pager: [phone]  07/06/2017, 10:57 AM

## 2017-07-06 NOTE — Anesthesia Procedure Notes (Signed)
Procedure Name: LMA Insertion Date/Time: 07/06/2017 11:14 AM Performed by: Jenne Campus, CRNA Pre-anesthesia Checklist: Patient identified, Emergency Drugs available, Suction available and Patient being monitored Patient Re-evaluated:Patient Re-evaluated prior to induction Oxygen Delivery Method: Circle System Utilized Preoxygenation: Pre-oxygenation with 100% oxygen Induction Type: IV induction Ventilation: Mask ventilation without difficulty LMA: LMA inserted LMA Size: 4.0 Number of attempts: 1 Airway Equipment and Method: Bite block Placement Confirmation: positive ETCO2 and breath sounds checked- equal and bilateral Tube secured with: Tape Dental Injury: Teeth and Oropharynx as per pre-operative assessment

## 2017-07-06 NOTE — Op Note (Signed)
OPERATIVE NOTE   PROCEDURE: 1. right first stage basilic vein transposition (brachiobasilic arteriovenous fistula) placement  PRE-OPERATIVE DIAGNOSIS: end stage renal disease   POST-OPERATIVE DIAGNOSIS: same as above   SURGEON: Adele Barthel, MD  ASSISTANT(S): Laurence Slate, PAC   ANESTHESIA: general  ESTIMATED BLOOD LOSS: 50 cc  FINDING(S): 1.  Basilic vein: 6.6-5.9 mm, acceptable 2.  Brachial artery: 3 mm, minimal wall thickening 3.  Venous outflow: palpable thrill  4.  Radial flow: palpable radial pulse  SPECIMEN(S):  none  INDICATIONS:   Jody [name] is a 77 y.o. female who presents with end stage renal disease.  The patient is scheduled for right radiocephalic arteriovenous fistula vs. first stage basilic vein transposition.  The patient is aware the risks include but are not limited to: bleeding, infection, steal syndrome, nerve damage, ischemic monomelic neuropathy, failure to mature, and need for additional procedures.  The patient is aware of the risks of the procedure and elects to proceed forward.   DESCRIPTION: After full informed written consent was obtained from the patient, the patient was brought back to the operating room and placed supine upon the operating table.  Prior to induction, the patient received IV antibiotics.   After obtaining adequate anesthesia, the patient was then prepped and draped in the standard fashion for a right arm access procedure.  I turned my attention first to identifying the patient's basilic vein and brachial artery.    Using SonoSite guidance, the location of these vessels were marked out on the skin.   At this point, I injected local anesthetic to obtain a field block of the antecubitum.  In total, I injected about 5 mL of 1% lidocaine without epinephrine.  I made a transverse incision at the level of the antecubitum and dissected through the subcutaneous tissue and fascia to gain exposure of the brachial artery.  This was noted  to be 3 mm in diameter externally.  This was dissected out proximally and distally and controlled with vessel loops .  I then dissected out the basilic vein.  This was noted to be 2.5-3.0 mm in diameter externally.  The distal segment of the vein was ligated with a  2-0 silk, and the vein was transected.  The proximal segment was interrogated with serial dilators.  The vein accepted up to a 3 mm dilator without any difficulty.  I then instilled the heparinized saline into the vein and clamped it.  At this point, I reset my exposure of the brachial artery and placed the artery under tension proximally and distally.  I made an arteriotomy with a #11 blade, and then I extended the arteriotomy with a Potts scissor.  I injected heparinized saline proximal and distal to this arteriotomy.  The vein was then sewn to the artery in an end-to-side configuration with a running stitch of 7-0 Prolene.  Prior to completing this anastomosis, I allowed the vein and artery to backbleed.  There was no evidence of clot from any vessels.  I completed the anastomosis in the usual fashion and then released all vessel loops and clamps.    There was a palpable thrill in the venous outflow, and there was a palpable radial pulse.  At this point, I irrigated out the surgical wound.  There was no further active bleeding.  The subcutaneous tissue was reapproximated with a running stitch of 3-0 Vicryl.  The skin was then reapproximated with a running subcuticular stitch of 4-0 Vicryl.  The skin was then cleaned, dried,  and reinforced with Dermabond.  The patient tolerated this procedure well.    COMPLICATIONS: none  CONDITION: stable   Adele Barthel, MD, [facility] [place] Office: [phone] Pager: [phone]  07/06/2017, 12:23 PM

## 2017-07-06 NOTE — Transfer of Care (Signed)
Immediate Anesthesia Transfer of Care Note  Patient: Jody [name]  Procedure(s) Performed: RIGHT RADIOCEPHALIC ARTERIOVENOUS FISTULA creation (Right Arm Lower)  Patient Location: PACU  Anesthesia Type:General  Level of Consciousness: awake, oriented and patient cooperative  Airway & Oxygen Therapy: Patient Spontanous Breathing and Patient connected to nasal cannula oxygen  Post-op Assessment: Report given to RN and Post -op Vital signs reviewed and stable  Post vital signs: Reviewed  Last Vitals:  Vitals Value Taken Time  BP 175/78 07/06/2017  1:04 PM  Temp    Pulse 76 07/06/2017  1:04 PM  Resp 10 07/06/2017  1:04 PM  SpO2 97 % 07/06/2017  1:04 PM  Vitals shown include unvalidated device data.  Last Pain:  Vitals:   07/06/17 1020  TempSrc: Oral  PainSc:       Patients Stated Pain Goal: 1 (97/02/63 7858)  Complications: No apparent anesthesia complications

## 2017-07-06 NOTE — Discharge Instructions (Signed)
° °[facility] [place] ° °Discharge Instructions ° °AV Fistula or Graft Surgery for Dialysis Access ° °Please refer to the following instructions for your post-procedure care. Your surgeon or physician assistant will discuss any changes with you. ° °Activity ° °You may drive the day following your surgery, if you are comfortable and no longer taking prescription pain medication. Resume full activity as the soreness in your incision resolves. ° °Bathing/Showering ° °You may shower after you go home. Keep your incision dry for 48 hours. Do not soak in a bathtub, hot tub, or swim until the incision heals completely. You may not shower if you have a hemodialysis catheter. ° °Incision Care ° °Clean your incision with mild soap and water after 48 hours. Pat the area dry with a clean towel. You do not need a bandage unless otherwise instructed. Do not apply any ointments or creams to your incision. You may have skin glue on your incision. Do not peel it off. It will come off on its own in about one week. Your arm may swell a bit after surgery. To reduce swelling use pillows to elevate your arm so it is above your heart. Your doctor will tell you if you need to lightly wrap your arm with an ACE bandage. ° °Diet ° °Resume your normal diet. There are not special food restrictions following this procedure. In order to heal from your surgery, it is CRITICAL to get adequate nutrition. Your body requires vitamins, minerals, and protein. Vegetables are the best source of vitamins and minerals. Vegetables also provide the perfect balance of protein. Processed food has little nutritional value, so try to avoid this. ° °Medications ° °Resume taking all of your medications. If your incision is causing pain, you may take over-the counter pain relievers such as acetaminophen (Tylenol). If you were prescribed a stronger pain medication, please be aware these medications can cause nausea and constipation. Prevent  nausea by taking the medication with a snack or meal. Avoid constipation by drinking plenty of fluids and eating foods with high amount of fiber, such as fruits, vegetables, and grains. Do not take Tylenol if you are taking prescription pain medications. ° ° ° ° °Follow up °Your surgeon may want to see you in the office following your access surgery. If so, this will be arranged at the time of your surgery. ° °Please call us immediately for any of the following conditions: ° °Increased pain, redness, drainage (pus) from your incision site °Fever of 101 degrees or higher °Severe or worsening pain at your incision site °Hand pain or numbness. ° °Reduce your risk of vascular disease: ° °Stop smoking. If you would like help, call [facility] at 1-800-QUIT-NOW (1-800-784-8669) or [place] at [phone] ° °Manage your cholesterol °Maintain a desired weight °Control your diabetes °Keep your blood pressure down ° °Dialysis ° °It will take several weeks to several months for your new dialysis access to be ready for use. Your surgeon will determine when it is OK to use it. Your nephrologist will continue to direct your dialysis. You can continue to use your Permcath until your new access is ready for use. ° °If you have any questions, please call the office at [phone]. ° ° °Post Anesthesia Home Care Instructions ° °Activity: °Get plenty of rest for the remainder of the day. A responsible individual must stay with you for 24 hours following the procedure.  °For the next 24 hours, DO NOT: °-Drive a car °-Operate machinery °-Drink alcoholic   beverages °-Take any medication unless instructed by your physician °-Make any legal decisions or sign important papers. ° °Meals: °Start with liquid foods such as gelatin or soup. Progress to regular foods as tolerated. Avoid greasy, spicy, heavy foods. If nausea and/or vomiting occur, drink only clear liquids until the nausea and/or vomiting subsides. Call your physician if  vomiting continues. ° °Special Instructions/Symptoms: °Your throat may feel dry or sore from the anesthesia or the breathing tube placed in your throat during surgery. If this causes discomfort, gargle with warm salt water. The discomfort should disappear within 24 hours. ° °If you had a scopolamine patch placed behind your ear for the management of post- operative nausea and/or vomiting: ° °1. The medication in the patch is effective for 72 hours, after which it should be removed.  Wrap patch in a tissue and discard in the trash. Wash hands thoroughly with soap and water. °2. You may remove the patch earlier than 72 hours if you experience unpleasant side effects which may include dry mouth, dizziness or visual disturbances. °3. Avoid touching the patch. Wash your hands with soap and water after contact with the patch. °   ° °

## 2017-07-06 NOTE — Anesthesia Preprocedure Evaluation (Addendum)
Anesthesia Evaluation  Patient identified by MRN, date of birth, ID band Patient awake    Reviewed: Allergy & Precautions, NPO status , Patient's Chart, lab work & pertinent test results  Airway Mallampati: II  TM Distance: >3 FB Neck ROM: Full    Dental  (+) Edentulous Upper, Edentulous Lower, Dental Advisory Given   Pulmonary COPD, former smoker,    breath sounds clear to auscultation       Cardiovascular hypertension, Pt. on medications +CHF   Rhythm:Regular Rate:Normal     Neuro/Psych    GI/Hepatic   Endo/Other  diabetes  Renal/GU ESRF and DialysisRenal disease     Musculoskeletal   Abdominal   Peds  Hematology   Anesthesia Other Findings   Reproductive/Obstetrics                           Anesthesia Physical Anesthesia Plan  ASA: III  Anesthesia Plan: General   Post-op Pain Management:    Induction: Intravenous  PONV Risk Score and Plan:   Airway Management Planned: LMA  Additional Equipment:   Intra-op Plan:   Post-operative Plan:   Informed Consent: I have reviewed the patients History and Physical, chart, labs and discussed the procedure including the risks, benefits and alternatives for the proposed anesthesia with the patient or authorized representative who has indicated his/her understanding and acceptance.     Plan Discussed with: CRNA and Anesthesiologist  Anesthesia Plan Comments:         Anesthesia Quick Evaluation

## 2017-07-07 ENCOUNTER — Encounter (HOSPITAL_COMMUNITY): Payer: Self-pay | Admitting: Vascular Surgery

## 2017-07-10 ENCOUNTER — Telehealth: Payer: Self-pay | Admitting: Vascular Surgery

## 2017-07-10 NOTE — Telephone Encounter (Signed)
sch appt 08/17/17 4pm p/o PA s/p R 1st BVT placement per stf msg

## 2017-07-16 ENCOUNTER — Other Ambulatory Visit: Payer: Self-pay | Admitting: Cardiovascular Disease

## 2017-08-17 ENCOUNTER — Ambulatory Visit (INDEPENDENT_AMBULATORY_CARE_PROVIDER_SITE_OTHER): Payer: Self-pay | Admitting: Physician Assistant

## 2017-08-17 VITALS — BP 167/77 | HR 66 | Temp 97.9°F | Resp 16 | Ht 67.0 in | Wt 208.0 lb

## 2017-08-17 DIAGNOSIS — Z992 Dependence on renal dialysis: Secondary | ICD-10-CM

## 2017-08-17 DIAGNOSIS — N186 End stage renal disease: Secondary | ICD-10-CM

## 2017-08-17 NOTE — Progress Notes (Signed)
Established Dialysis Access   History of Present Illness   Jody [name] is a 77 y.o. ([birth year]) female who presents for re-evaluation of her right arm arteriovenous fistula.  She is status post right arm first stage basilic vein transposition by Dr. Bridgett Larsson on 07/06/2017.  She states the incision of her right arm healed up well without drainage or any sign of infection.   She denies any signs or symptoms of a steal syndrome in her right arm.  She is dialyzing from her left IJ tunneled dialysis catheter on a Tuesday Thursday Saturday schedule without complication.  She is willing to proceed with her second stage basilic vein transposition.  She is taking Eliquis for atrial fibrillation.  The patient's PMH, PSH, SH, and FamHx were reviewed and are unchanged from prior visit.  Current Outpatient Medications  Medication Sig Dispense Refill  . albuterol (PROVENTIL HFA;VENTOLIN HFA) 108 (90 BASE) MCG/ACT inhaler Inhale 1-2 puffs into the lungs every 6 (six) hours as needed for wheezing or shortness of breath. Reported on 03/01/2015    . amLODipine (NORVASC) 10 MG tablet Take 1 tablet (10 mg total) by mouth daily. 30 tablet 1  . aspirin EC 81 MG tablet Take 81 mg by mouth daily.    [name] atorvastatin (LIPITOR) 40 MG tablet Take 1 tablet by mouth at bedtime.   0  . B-D UF III MINI PEN NEEDLES 31G X 5 MM MISC   0  . cilostazol (PLETAL) 50 MG tablet Take 50 mg by mouth 2 (two) times daily.  0  . diltiazem (CARDIZEM) 30 MG tablet Take 30 mg by mouth 3 (three) times daily.    [name] ELIQUIS 2.5 MG TABS tablet Take 1 tablet (2.5 mg total) by mouth 2 (two) times daily. 60 tablet 3  . fluticasone-salmeterol (ADVAIR HFA) 230-21 MCG/ACT inhaler Inhale 2 puffs into the lungs 2 (two) times daily.    . furosemide (LASIX) 40 MG tablet Take 1 tablet (40 mg total) by mouth daily. 60 tablet 3  . gabapentin (NEURONTIN) 600 MG tablet Take 600 mg by mouth 3 (three) times daily.    . hydrALAZINE (APRESOLINE) 100 MG tablet  TAKE ONE (1) TABLET THREE (3) TIMES EACH DAY 270 tablet 0  . Insulin Degludec (TRESIBA FLEXTOUCH) 100 UNIT/ML SOPN Inject 14 Units into the skin daily.     . metoprolol tartrate (LOPRESSOR) 25 MG tablet Take 0.5 tablets (12.5 mg total) by mouth 2 (two) times daily. 30 tablet 0  . ONE TOUCH ULTRA TEST test strip     . oxyCODONE-acetaminophen (PERCOCET/ROXICET) 5-325 MG tablet Take 1 tablet by mouth every 6 (six) hours as needed. 6 tablet 0  . polyethylene glycol (MIRALAX / GLYCOLAX) packet Take 17 g by mouth daily as needed (constipation).    . predniSONE (DELTASONE) 10 MG tablet Take 4 tablets (40 mg total) by mouth daily with breakfast. And decrease by one tablet daily 10 tablet 0  . propranolol (INDERAL) 10 MG tablet Take 10 mg by mouth 2 (two) times daily.    . sodium bicarbonate 650 MG tablet Take 1 tablet (650 mg total) by mouth 2 (two) times daily. 60 tablet 1  . TOREMIFENE CITRATE PO Take 40 mg by mouth See admin instructions. Take 40 mg by mouth once daily on non-dialysis Sun, Mon, Wed, and Fri.     No current facility-administered medications for this visit.     On ROS today: 10 system ROS is negative unless otherwise noted in HPI  Physical Examination   Vitals:   08/17/17 1328  BP: (!) 167/77  Pulse: 66  Resp: 16  Temp: 97.9 F (36.6 C)  TempSrc: Oral  SpO2: 99%  Weight: 208 lb 0.6 oz (94.4 kg)  Height: 5\' 7"  (1.702 m)   Body mass index is 32.58 kg/m.  General Alert, O x 3, WD, NAD  Pulmonary Sym exp, good B air movt,  Cardiac RRR, Nl S1, S2,   Vascular Vessel Right Left  Radial Palpable Palpable  Brachial Palpable Palpable  Ulnar Palpable Not palpable    Musculo- skeletal  right antecubitum incision well-healed; palpable thrill near the elbow and audible bruit throughout upper arm M/S 5/5 throughout  , Extremities without ischemic changes    Neurologic A&O; CN grossly intact     Non-invasive Vascular Imaging    Ultrasound performed at time of  exam  Diameter of basilic vein fistula ranges from 0.48 cm to 0.65 cm    Medical Decision Making   Jody [name] is a 77 y.o. female who presents with ESRD requiring hemodialysis.    Patent R arm AV fistula ready for 2nd stage transposition by Dr. Bridgett Larsson based on ultrasound and physical exam  The patient is aware that the risks of access surgery include but are not limited to: bleeding, infection, steal syndrome, nerve damage, failure of access to mature, and possible need for additional access procedures in the future.  The patient has agreed to proceed with the above procedure which will be scheduled with Dr. Bridgett Larsson on a non-dialysis day  Eliquis will be held 48 hours prior to surgery   Dagoberto Ligas PA-C [facility] Office: [phone]

## 2017-08-17 NOTE — H&P (View-Only) (Signed)
Established Dialysis Access   History of Present Illness   Jody [name] is a 77 y.o. (1940-08-19) female who presents for re-evaluation of her right arm arteriovenous fistula.  She is status post right arm first stage basilic vein transposition by Dr. Bridgett Larsson on 07/06/2017.  She states the incision of her right arm healed up well without drainage or any sign of infection.   She denies any signs or symptoms of a steal syndrome in her right arm.  She is dialyzing from her left IJ tunneled dialysis catheter on a Tuesday Thursday Saturday schedule without complication.  She is willing to proceed with her second stage basilic vein transposition.  She is taking Eliquis for atrial fibrillation.  The patient's PMH, PSH, SH, and FamHx were reviewed and are unchanged from prior visit.  Current Outpatient Medications  Medication Sig Dispense Refill  . albuterol (PROVENTIL HFA;VENTOLIN HFA) 108 (90 BASE) MCG/ACT inhaler Inhale 1-2 puffs into the lungs every 6 (six) hours as needed for wheezing or shortness of breath. Reported on 03/01/2015    . amLODipine (NORVASC) 10 MG tablet Take 1 tablet (10 mg total) by mouth daily. 30 tablet 1  . aspirin EC 81 MG tablet Take 81 mg by mouth daily.    [name] atorvastatin (LIPITOR) 40 MG tablet Take 1 tablet by mouth at bedtime.   0  . B-D UF III MINI PEN NEEDLES 31G X 5 MM MISC   0  . cilostazol (PLETAL) 50 MG tablet Take 50 mg by mouth 2 (two) times daily.  0  . diltiazem (CARDIZEM) 30 MG tablet Take 30 mg by mouth 3 (three) times daily.    [name] ELIQUIS 2.5 MG TABS tablet Take 1 tablet (2.5 mg total) by mouth 2 (two) times daily. 60 tablet 3  . fluticasone-salmeterol (ADVAIR HFA) 230-21 MCG/ACT inhaler Inhale 2 puffs into the lungs 2 (two) times daily.    . furosemide (LASIX) 40 MG tablet Take 1 tablet (40 mg total) by mouth daily. 60 tablet 3  . gabapentin (NEURONTIN) 600 MG tablet Take 600 mg by mouth 3 (three) times daily.    . hydrALAZINE (APRESOLINE) 100 MG tablet  TAKE ONE (1) TABLET THREE (3) TIMES EACH DAY 270 tablet 0  . Insulin Degludec (TRESIBA FLEXTOUCH) 100 UNIT/ML SOPN Inject 14 Units into the skin daily.     . metoprolol tartrate (LOPRESSOR) 25 MG tablet Take 0.5 tablets (12.5 mg total) by mouth 2 (two) times daily. 30 tablet 0  . ONE TOUCH ULTRA TEST test strip     . oxyCODONE-acetaminophen (PERCOCET/ROXICET) 5-325 MG tablet Take 1 tablet by mouth every 6 (six) hours as needed. 6 tablet 0  . polyethylene glycol (MIRALAX / GLYCOLAX) packet Take 17 g by mouth daily as needed (constipation).    . predniSONE (DELTASONE) 10 MG tablet Take 4 tablets (40 mg total) by mouth daily with breakfast. And decrease by one tablet daily 10 tablet 0  . propranolol (INDERAL) 10 MG tablet Take 10 mg by mouth 2 (two) times daily.    . sodium bicarbonate 650 MG tablet Take 1 tablet (650 mg total) by mouth 2 (two) times daily. 60 tablet 1  . TOREMIFENE CITRATE PO Take 40 mg by mouth See admin instructions. Take 40 mg by mouth once daily on non-dialysis Sun, Mon, Wed, and Fri.     No current facility-administered medications for this visit.     On ROS today: 10 system ROS is negative unless otherwise noted in HPI  Physical Examination   Vitals:   08/17/17 1328  BP: (!) 167/77  Pulse: 66  Resp: 16  Temp: 97.9 F (36.6 C)  TempSrc: Oral  SpO2: 99%  Weight: 208 lb 0.6 oz (94.4 kg)  Height: 5\' 7"  (1.702 m)   Body mass index is 32.58 kg/m.  General Alert, O x 3, WD, NAD  Pulmonary Sym exp, good B air movt,  Cardiac RRR, Nl S1, S2,   Vascular Vessel Right Left  Radial Palpable Palpable  Brachial Palpable Palpable  Ulnar Palpable Not palpable    Musculo- skeletal  right antecubitum incision well-healed; palpable thrill near the elbow and audible bruit throughout upper arm M/S 5/5 throughout  , Extremities without ischemic changes    Neurologic A&O; CN grossly intact     Non-invasive Vascular Imaging    Ultrasound performed at time of  exam  Diameter of basilic vein fistula ranges from 0.48 cm to 0.65 cm    Medical Decision Making   Jody [name] is a 77 y.o. female who presents with ESRD requiring hemodialysis.    Patent R arm AV fistula ready for 2nd stage transposition by Dr. Bridgett Larsson based on ultrasound and physical exam  The patient is aware that the risks of access surgery include but are not limited to: bleeding, infection, steal syndrome, nerve damage, failure of access to mature, and possible need for additional access procedures in the future.  The patient has agreed to proceed with the above procedure which will be scheduled with Dr. Bridgett Larsson on a non-dialysis day  Eliquis will be held 48 hours prior to surgery   Dagoberto Ligas PA-C [facility] Office: [phone]

## 2017-08-22 ENCOUNTER — Other Ambulatory Visit: Payer: Self-pay | Admitting: *Deleted

## 2017-08-22 NOTE — Progress Notes (Signed)
Instructed patient to arrive at [facility] admitting department at 10 am on 09/03/17 for surgery. NPO past MN and must have a driver for home. Expect a call and follow the detailed pre-op instructions, insulin adjustment and medication instruction received from the pre-admission department about this surgery. Verbalized understanding.

## 2017-08-31 ENCOUNTER — Other Ambulatory Visit: Payer: Self-pay

## 2017-08-31 ENCOUNTER — Encounter (HOSPITAL_COMMUNITY): Payer: Self-pay | Admitting: *Deleted

## 2017-08-31 NOTE — Pre-Procedure Instructions (Addendum)
   Jadea Shiffer  08/31/2017    Your procedure is scheduled on Monday, September 03, 2017 at 12:20 PM.   Report to [facility] Entrance "A" Admitting Office at 10:00 AM.   Call this number if you have problems the morning of surgery: [phone]   Remember:  Do not eat or drink after midnight Sunday, 09/02/17.  Take these medicines the morning of surgery with A SIP OF WATER: Aspirin, Diltiazem (Cardizem), Cilostazol (Pletal), Gabapentin (Neurontin), Hydralazine (Apresoline), Propanolol (Inderal), Oxycodone - if needed. Use your Advair inhaler and if you need your Albuterol inhaler you can use it. Please bring your Albuterol inhaler with you Monday morning.   Your last dose of Eliquis is to be Saturday (09/01/17) AM. Do not take it again until after surgery.  Please check your blood sugar Monday when you get up and every 2 hours until you leave for the hospital. If blood sugar is 70 or below, treat with 1/2 cup of clear juice (apple or cranberry) and recheck blood sugar 15 minutes after drinking juice. If blood sugar continues to be 70 or below, call the Short Stay department and ask to speak to a nurse.    Do not wear jewelry, make-up or nail polish.  Do not wear lotions, powders, perfumes or deodorant.  Do not shave 48 hours prior to surgery.    Do not bring valuables to the hospital.  [facility] is not responsible for any belongings or valuables.  Contacts, dentures or bridgework may not be worn into surgery.  Leave your suitcase in the car.  After surgery it may be brought to your room.  Patients discharged the day of surgery will not be allowed to drive home.   Please wear clean clothes to the hospital. Be sure to brush your teeth the morning of surgery.

## 2017-08-31 NOTE — Progress Notes (Signed)
Spoke with pt for pre-op call. Pt is not a very good historian and does not know her medications. Pt denies any recent chest pain or sob. Pt is a type 2 diabetic. Last A1C was 4.7 on 04/22/17. She is no longer on medications for diabetes. Pt states her fasting blood sugar is usually between 119-124. Pt goes to Dialysis at [facility] in [place]. I asked pt if it would be ok for me to send her pre-op instructions to the dialysis center so the nurse could review them with her Saturday. She states that would be the best thing to do. Pt is on Eliquis and was instructed to stop it 48 hours prior to surgery. I asked her if she knew when she was supposed to stop it and she couldn't tell me. I told her that last dose should be Saturday AM. I also put that in the instructions that I have sent to the dialysis center.  I did speak with Ronny Bacon, RN at [facility] this morning and she states she will make sure patient gets the instructions and she will find out pt's most recent A1C and call me with that result when it's less busy there. Faxed instructions to [phone]

## 2017-09-03 ENCOUNTER — Ambulatory Visit (HOSPITAL_COMMUNITY): Payer: Medicare Other | Admitting: Anesthesiology

## 2017-09-03 ENCOUNTER — Ambulatory Visit (HOSPITAL_COMMUNITY)
Admission: RE | Admit: 2017-09-03 | Discharge: 2017-09-03 | Disposition: A | Discharge status: Home / Self Care | Payer: Medicare Other | Source: Ambulatory Visit | Attending: Vascular Surgery | Admitting: Vascular Surgery

## 2017-09-03 ENCOUNTER — Encounter (HOSPITAL_COMMUNITY)
Admission: RE | Disposition: A | Discharge status: Home / Self Care | Payer: Self-pay | Source: Ambulatory Visit | Attending: Vascular Surgery

## 2017-09-03 ENCOUNTER — Encounter (HOSPITAL_COMMUNITY): Payer: Self-pay | Admitting: Urology

## 2017-09-03 DIAGNOSIS — I132 Hypertensive heart and chronic kidney disease with heart failure and with stage 5 chronic kidney disease, or end stage renal disease: Secondary | ICD-10-CM | POA: Diagnosis not present

## 2017-09-03 DIAGNOSIS — N186 End stage renal disease: Secondary | ICD-10-CM | POA: Insufficient documentation

## 2017-09-03 DIAGNOSIS — I4891 Unspecified atrial fibrillation: Secondary | ICD-10-CM | POA: Insufficient documentation

## 2017-09-03 DIAGNOSIS — E1122 Type 2 diabetes mellitus with diabetic chronic kidney disease: Secondary | ICD-10-CM | POA: Diagnosis not present

## 2017-09-03 DIAGNOSIS — I509 Heart failure, unspecified: Secondary | ICD-10-CM | POA: Diagnosis not present

## 2017-09-03 DIAGNOSIS — J449 Chronic obstructive pulmonary disease, unspecified: Secondary | ICD-10-CM | POA: Diagnosis not present

## 2017-09-03 DIAGNOSIS — Z5309 Procedure and treatment not carried out because of other contraindication: Secondary | ICD-10-CM | POA: Diagnosis not present

## 2017-09-03 DIAGNOSIS — Z7902 Long term (current) use of antithrombotics/antiplatelets: Secondary | ICD-10-CM | POA: Insufficient documentation

## 2017-09-03 DIAGNOSIS — Z87891 Personal history of nicotine dependence: Secondary | ICD-10-CM | POA: Insufficient documentation

## 2017-09-03 DIAGNOSIS — Z794 Long term (current) use of insulin: Secondary | ICD-10-CM | POA: Insufficient documentation

## 2017-09-03 DIAGNOSIS — Z79899 Other long term (current) drug therapy: Secondary | ICD-10-CM | POA: Diagnosis not present

## 2017-09-03 HISTORY — DX: Adverse effect of unspecified anesthetic, initial encounter: T41.45XA

## 2017-09-03 HISTORY — DX: Other complications of anesthesia, initial encounter: T88.59XA

## 2017-09-03 LAB — POCT I-STAT 4, (NA,K, GLUC, HGB,HCT)
Glucose, Bld: 106 mg/dL — ABNORMAL HIGH (ref 70–99)
HEMATOCRIT: 27 % — AB (ref 36.0–46.0)
Hemoglobin: 9.2 g/dL — ABNORMAL LOW (ref 12.0–15.0)
Potassium: 4.4 mmol/L (ref 3.5–5.1)
Sodium: 141 mmol/L (ref 135–145)

## 2017-09-03 LAB — PROTIME-INR
INR: 1.09
PROTHROMBIN TIME: 14 s (ref 11.4–15.2)

## 2017-09-03 LAB — GLUCOSE, CAPILLARY
GLUCOSE-CAPILLARY: 97 mg/dL (ref 70–99)
Glucose-Capillary: 86 mg/dL (ref 70–99)

## 2017-09-03 SURGERY — TRANSPOSITION, VEIN, BASILIC
Anesthesia: General | Laterality: Right

## 2017-09-03 MED ORDER — CHLORHEXIDINE GLUCONATE 4 % EX LIQD: 60.0000 mL | Freq: Once | CUTANEOUS | Status: DC

## 2017-09-03 MED ORDER — SODIUM CHLORIDE 0.9 % IV SOLN
INTRAVENOUS | Status: DC | PRN
  Administered 2017-09-03: 11:00:00 via INTRAVENOUS

## 2017-09-03 MED ORDER — SODIUM CHLORIDE 0.9 % IV SOLN
INTRAVENOUS | Status: DC
  Administered 2017-09-03: 11:00:00 via INTRAVENOUS

## 2017-09-03 MED ORDER — 0.9 % SODIUM CHLORIDE (POUR BTL) OPTIME
TOPICAL | Status: DC | PRN
  Administered 2017-09-03: 1000 mL

## 2017-09-03 MED ORDER — FENTANYL CITRATE (PF) 250 MCG/5ML IJ SOLN
INTRAMUSCULAR | Status: AC
Start: 2017-09-03 — End: ?
  Filled 2017-09-03: qty 5

## 2017-09-03 MED ORDER — PROPOFOL 10 MG/ML IV BOLUS
INTRAVENOUS | Status: AC
  Filled 2017-09-03: qty 20

## 2017-09-03 MED ORDER — SODIUM CHLORIDE 0.9 % IV SOLN
INTRAVENOUS | Status: AC
  Filled 2017-09-03: qty 1.2

## 2017-09-03 MED ORDER — LIDOCAINE HCL (PF) 1 % IJ SOLN
INTRAMUSCULAR | Status: AC
  Filled 2017-09-03: qty 30

## 2017-09-03 MED ORDER — CEFAZOLIN SODIUM-DEXTROSE 2-4 GM/100ML-% IV SOLN: 2.0000 g | INTRAVENOUS | Status: DC

## 2017-09-03 MED ORDER — SODIUM CHLORIDE 0.9 % IV SOLN
INTRAVENOUS | Status: DC | PRN
  Administered 2017-09-03: 12:00:00

## 2017-09-03 MED ORDER — CEFAZOLIN SODIUM-DEXTROSE 2-4 GM/100ML-% IV SOLN
INTRAVENOUS | Status: AC
  Filled 2017-09-03: qty 100

## 2017-09-03 SURGICAL SUPPLY — 34 items
ARMBAND PINK RESTRICT EXTREMIT (MISCELLANEOUS) ×3 IMPLANT
CANISTER SUCT 3000ML PPV (MISCELLANEOUS) ×3 IMPLANT
CLIP VESOCCLUDE MED 24/CT (CLIP) ×3 IMPLANT
CLIP VESOCCLUDE SM WIDE 24/CT (CLIP) ×3 IMPLANT
CORDS BIPOLAR (ELECTRODE) IMPLANT
COVER PROBE W GEL 5X96 (DRAPES) ×3 IMPLANT
DECANTER SPIKE VIAL GLASS SM (MISCELLANEOUS) ×3 IMPLANT
DERMABOND ADVANCED (GAUZE/BANDAGES/DRESSINGS) ×2
DERMABOND ADVANCED .7 DNX12 (GAUZE/BANDAGES/DRESSINGS) ×1 IMPLANT
ELECT REM PT RETURN 9FT ADLT (ELECTROSURGICAL) ×3
ELECTRODE REM PT RTRN 9FT ADLT (ELECTROSURGICAL) ×1 IMPLANT
GLOVE BIO SURGEON STRL SZ7 (GLOVE) ×3 IMPLANT
GLOVE BIOGEL PI IND STRL 7.5 (GLOVE) ×1 IMPLANT
GLOVE BIOGEL PI INDICATOR 7.5 (GLOVE) ×2
GOWN STRL REUS W/ TWL LRG LVL3 (GOWN DISPOSABLE) ×3 IMPLANT
GOWN STRL REUS W/TWL LRG LVL3 (GOWN DISPOSABLE) ×6
HEMOSTAT SPONGE AVITENE ULTRA (HEMOSTASIS) IMPLANT
KIT BASIN OR (CUSTOM PROCEDURE TRAY) ×3 IMPLANT
KIT TURNOVER KIT B (KITS) ×3 IMPLANT
NEEDLE HYPO 25GX1X1/2 BEV (NEEDLE) ×3 IMPLANT
NS IRRIG 1000ML POUR BTL (IV SOLUTION) ×3 IMPLANT
PACK CV ACCESS (CUSTOM PROCEDURE TRAY) ×3 IMPLANT
PAD ARMBOARD 7.5X6 YLW CONV (MISCELLANEOUS) ×6 IMPLANT
SUT MNCRL AB 4-0 PS2 18 (SUTURE) ×3 IMPLANT
SUT PROLENE 6 0 BV (SUTURE) ×3 IMPLANT
SUT PROLENE 7 0 BV 1 (SUTURE) IMPLANT
SUT SILK 2 0 SH (SUTURE) IMPLANT
SUT VIC AB 2-0 CT1 27 (SUTURE) ×2
SUT VIC AB 2-0 CT1 TAPERPNT 27 (SUTURE) ×1 IMPLANT
SUT VIC AB 3-0 SH 27 (SUTURE) ×4
SUT VIC AB 3-0 SH 27X BRD (SUTURE) ×2 IMPLANT
TOWEL GREEN STERILE (TOWEL DISPOSABLE) ×3 IMPLANT
UNDERPAD 30X30 (UNDERPADS AND DIAPERS) ×3 IMPLANT
WATER STERILE IRR 1000ML POUR (IV SOLUTION) ×3 IMPLANT

## 2017-09-03 NOTE — Progress Notes (Addendum)
   Daily Progress Note  There is uncertainty about when the patient last took her Eliquis.  She told the RN in the AM that she took a dose yesterday.  After a lengthy conversation with the patient, I am not convinced she knows when her last dose was taken.  Her recollection is non-reproducible: she verbalized that she can't remember if her "last dose was Friday, or Sat, or Sun."   The second stage transposition is usually an extensive dissection involving the entirety of the upper arm.  This is not an acceptable risk if the patient is fully anticoagulated.  I counseled rescheduling this case, as there is no standardized assay for determining active Eliquis in the patient's blood stream.  Obviously, patient is upset.  I will have someone from Patient Experience talk to her.  The safest option is simply to reschedule the patient to Friday/Monday if patient is willing.   Adele Barthel, MD, FACS [facility] [place] Office: [phone] Pager: [phone]  09/03/2017, 2:16 PM

## 2017-09-03 NOTE — Interval H&P Note (Signed)
   History and Physical Update  The patient was interviewed and re-examined.  The patient's previous History and Physical has been reviewed and is unchanged from my PA's consult.  There is no change in the plan of care: Left second stage basilic vein transposition.  This patient has a short basilic vein segment, so it rapidly becomes a brachial vein.    Risk, benefits, and alternatives to access surgery were discussed.    The patient is aware the risks include but are not limited to: bleeding, infection, steal syndrome, nerve damage, ischemic monomelic neuropathy, thrombosis, failure to mature, complications related to venous hypertension, need for additional procedures, death and stroke.    The patient agrees to proceed forward with the procedure.   Adele Barthel, MD, FACS [facility] [place] Office: [phone] Pager: [phone]  09/03/2017, 10:36 AM

## 2017-09-03 NOTE — H&P (View-Only) (Signed)
   Daily Progress Note  There is uncertainty about when the patient last took her Eliquis.  She told the RN in the AM that she took a dose yesterday.  After a lengthy conversation with the patient, I am not convinced she knows when her last dose was taken.  Her recollection is non-reproducible: she verbalized that she can't remember if her "last dose was Friday, or Sat, or Sun."   The second stage transposition is usually an extensive dissection involving the entirety of the upper arm.  This is not an acceptable risk if the patient is fully anticoagulated.  I counseled rescheduling this case, as there is no standardized assay for determining active Eliquis in the patient's blood stream.  Obviously, patient is upset.  I will have someone from Patient Experience talk to her.  The safest option is simply to reschedule the patient to Friday/Monday if patient is willing.   Adele Barthel, MD, FACS [facility] [place] Office: [phone] Pager: [phone]  09/03/2017, 2:16 PM

## 2017-09-03 NOTE — Anesthesia Preprocedure Evaluation (Addendum)
Anesthesia Evaluation  Patient identified by MRN, date of birth, ID band Patient awake    Reviewed: Allergy & Precautions, NPO status , Patient's Chart, lab work & pertinent test results  History of Anesthesia Complications (+) history of anesthetic complications  Airway Mallampati: II  TM Distance: >3 FB Neck ROM: Full    Dental  (+) Edentulous Upper, Edentulous Lower, Dental Advisory Given   Pulmonary COPD,  COPD inhaler, former smoker,    breath sounds clear to auscultation       Cardiovascular hypertension, Pt. on medications and Pt. on home beta blockers + Past MI and +CHF  + dysrhythmias Atrial Fibrillation  Rhythm:Regular Rate:Normal  Echo 9/18 Impressions:  - Mild LVH with LVEF 60-65% and grade 2 diastolic dysfunction.   Moderate left atrial enlargement. Mild calcified mitral annulus   with trivial mitral regurgitation. Moderately sclerotic aortic   valve without stenosis. Trivial aortic regurgitation. Trivial   tricuspid regurgitation.    Neuro/Psych TIACVA, Residual Symptoms    GI/Hepatic   Endo/Other  diabetes, Type 2  Renal/GU ESRF and DialysisRenal disease     Musculoskeletal  (+) Arthritis , Osteoarthritis,    Abdominal   Peds  Hematology  (+) anemia ,   Anesthesia Other Findings   Reproductive/Obstetrics                            Anesthesia Physical  Anesthesia Plan  ASA: III  Anesthesia Plan: General   Post-op Pain Management:    Induction: Intravenous  PONV Risk Score and Plan:   Airway Management Planned: LMA  Additional Equipment:   Intra-op Plan:   Post-operative Plan:   Informed Consent: I have reviewed the patients History and Physical, chart, labs and discussed the procedure including the risks, benefits and alternatives for the proposed anesthesia with the patient or authorized representative who has indicated his/her understanding and  acceptance.   Dental Advisory Given  Plan Discussed with: CRNA, Anesthesiologist and Surgeon  Anesthesia Plan Comments:       Anesthesia Quick Evaluation

## 2017-09-03 NOTE — Progress Notes (Signed)
Per Rx entry, pt took Eliquis yesterday. Pt states she is unsure whether she had it yesterday or the day before. Notified Dr. Bridgett Larsson, per MD, pt will need to be rescheduled.

## 2017-09-04 NOTE — Addendum Note (Signed)
Addendum  created 09/04/17 0657 by Janeece Riggers, MD   Intraprocedure Flowsheets edited

## 2017-09-04 NOTE — Addendum Note (Signed)
Addendum  created 09/04/17 5612 by Janeece Riggers, MD   Intraprocedure Attestations deleted, Intraprocedure Staff edited

## 2017-09-13 ENCOUNTER — Other Ambulatory Visit: Payer: Self-pay | Admitting: *Deleted

## 2017-09-13 ENCOUNTER — Telehealth: Payer: Self-pay | Admitting: *Deleted

## 2017-09-13 ENCOUNTER — Encounter: Payer: Self-pay | Admitting: *Deleted

## 2017-09-13 NOTE — Progress Notes (Signed)
Spoke to [facility] at [facility]. To review instruction letter with patient at today's treatment at [facility].

## 2017-09-13 NOTE — Pre-Procedure Instructions (Addendum)
   [name]  09/13/2017    Your procedure is scheduled on Monday, September 17, 2017 at 12:30 PM.   Report to [facility] Entrance "A" Admitting Office at 10:00 AM.   Call this number if you have problems the morning of surgery: [phone]   Remember:  Do not eat or drink after midnight Sunday, 09/16/17.  Take these medicines the morning of surgery with A SIP OF WATER: Aspirin, Cilostazol (Pletal), Diltiazem (Dilacor XR), Gabapentin (Neurontin), Hydralazine (Apresoline), Propanolol (Inderal), Oxycodone - if needed, Advair inhaler, Albuterol inhaler - if needed (bring this inhaler with you Monday AM)  Stop Eliquis 3 days before surgery.    Do not wear jewelry, make-up or nail polish.  Do not wear lotions, powders, perfumes or deodorant.  Do not shave 48 hours prior to surgery.    Do not bring valuables to the hospital.  [facility] is not responsible for any belongings or valuables.  Be sure to brush your teeth Monday prior to surgery.  Contacts, dentures or bridgework may not be worn into surgery.  Leave your suitcase in the car.  After surgery it may be brought to your room.  For patients admitted to the hospital, discharge time will be determined by your treatment team.  Patients discharged the day of surgery will not be allowed to drive home.

## 2017-09-13 NOTE — Telephone Encounter (Signed)
Joy at [facility] confirmed received pre-op instruction letter.

## 2017-09-13 NOTE — Progress Notes (Signed)
Pt has dialysis on T/TH/Sa. I called [facility] in [place] and spoke with Joy who answered the phone. I asked her if Mrs. Jody [name] was there now having dialysis and she said yes. I asked her if I could fax pre-op instructions to Mrs. Jody [name] so she could have written instructions. Pt has some memory issues and hoping that having written instructions would help. Joy gave me the fax number and stated that they would be glad to give her the instructions.

## 2017-09-14 ENCOUNTER — Other Ambulatory Visit: Payer: Self-pay

## 2017-09-14 ENCOUNTER — Encounter (HOSPITAL_COMMUNITY): Payer: Self-pay | Admitting: *Deleted

## 2017-09-14 NOTE — Progress Notes (Signed)
Spoke with pt for pre-op call. Pt states she got the instructions that I sent to the dialysis center and states she just finished reading them. She verified that she is out of her Eliquis and has not had it for quite some time. I told her if she gets it refilled, do not take it until after surgery. She states she won't start it until after surgery because she doesn't want her surgery to be cancelled again. Pt denies any recent chest pain. She states she did have some wheezing yesterday AM after doing some laundry. States she used her inhaler and was better before she went to dialysis. Pt states her blood sugar this morning was 122.

## 2017-09-17 ENCOUNTER — Ambulatory Visit (HOSPITAL_COMMUNITY): Payer: Medicare Other | Admitting: Anesthesiology

## 2017-09-17 ENCOUNTER — Encounter (HOSPITAL_COMMUNITY): Payer: Self-pay | Admitting: Anesthesiology

## 2017-09-17 ENCOUNTER — Encounter (HOSPITAL_COMMUNITY)
Admission: RE | Disposition: A | Discharge status: Home / Self Care | Payer: Self-pay | Source: Ambulatory Visit | Attending: Vascular Surgery

## 2017-09-17 ENCOUNTER — Ambulatory Visit (HOSPITAL_COMMUNITY)
Admission: RE | Admit: 2017-09-17 | Discharge: 2017-09-17 | Disposition: A | Discharge status: Home / Self Care | Payer: Medicare Other | Source: Ambulatory Visit | Attending: Vascular Surgery | Admitting: Vascular Surgery

## 2017-09-17 DIAGNOSIS — N186 End stage renal disease: Secondary | ICD-10-CM

## 2017-09-17 DIAGNOSIS — E1122 Type 2 diabetes mellitus with diabetic chronic kidney disease: Secondary | ICD-10-CM | POA: Insufficient documentation

## 2017-09-17 DIAGNOSIS — I132 Hypertensive heart and chronic kidney disease with heart failure and with stage 5 chronic kidney disease, or end stage renal disease: Secondary | ICD-10-CM | POA: Diagnosis not present

## 2017-09-17 DIAGNOSIS — I509 Heart failure, unspecified: Secondary | ICD-10-CM | POA: Diagnosis not present

## 2017-09-17 DIAGNOSIS — M1990 Unspecified osteoarthritis, unspecified site: Secondary | ICD-10-CM | POA: Insufficient documentation

## 2017-09-17 DIAGNOSIS — Z992 Dependence on renal dialysis: Secondary | ICD-10-CM | POA: Diagnosis not present

## 2017-09-17 DIAGNOSIS — I4891 Unspecified atrial fibrillation: Secondary | ICD-10-CM | POA: Diagnosis not present

## 2017-09-17 DIAGNOSIS — Z87891 Personal history of nicotine dependence: Secondary | ICD-10-CM | POA: Diagnosis not present

## 2017-09-17 DIAGNOSIS — J449 Chronic obstructive pulmonary disease, unspecified: Secondary | ICD-10-CM | POA: Insufficient documentation

## 2017-09-17 DIAGNOSIS — D631 Anemia in chronic kidney disease: Secondary | ICD-10-CM | POA: Diagnosis not present

## 2017-09-17 DIAGNOSIS — I252 Old myocardial infarction: Secondary | ICD-10-CM | POA: Insufficient documentation

## 2017-09-17 DIAGNOSIS — N185 Chronic kidney disease, stage 5: Secondary | ICD-10-CM | POA: Diagnosis not present

## 2017-09-17 HISTORY — PX: BASCILIC VEIN TRANSPOSITION: SHX5742

## 2017-09-17 LAB — GLUCOSE, CAPILLARY
Glucose-Capillary: 85 mg/dL (ref 70–99)
Glucose-Capillary: 75 mg/dL (ref 70–99)
Glucose-Capillary: 78 mg/dL (ref 70–99)

## 2017-09-17 LAB — POCT I-STAT 4, (NA,K, GLUC, HGB,HCT)
GLUCOSE: 88 mg/dL (ref 70–99)
HCT: 27 % — ABNORMAL LOW (ref 36.0–46.0)
HEMOGLOBIN: 9.2 g/dL — AB (ref 12.0–15.0)
Potassium: 4.1 mmol/L (ref 3.5–5.1)
Sodium: 141 mmol/L (ref 135–145)

## 2017-09-17 SURGERY — TRANSPOSITION, VEIN, BASILIC
Anesthesia: General | Site: Arm Upper | Laterality: Right

## 2017-09-17 MED ORDER — DEXAMETHASONE SODIUM PHOSPHATE 10 MG/ML IJ SOLN
INTRAMUSCULAR | Status: AC
  Filled 2017-09-17: qty 1

## 2017-09-17 MED ORDER — OXYCODONE-ACETAMINOPHEN 5-325 MG PO TABS: 1.0000 | ORAL_TABLET | Freq: Four times a day (QID) | ORAL | 0 refills | Status: DC | PRN

## 2017-09-17 MED ORDER — EPHEDRINE SULFATE-NACL 50-0.9 MG/10ML-% IV SOSY
PREFILLED_SYRINGE | INTRAVENOUS | Status: DC | PRN
  Administered 2017-09-17 (×3): 5 mg via INTRAVENOUS
  Administered 2017-09-17 (×2): 10 mg via INTRAVENOUS
  Administered 2017-09-17: 5 mg via INTRAVENOUS
  Administered 2017-09-17: 10 mg via INTRAVENOUS

## 2017-09-17 MED ORDER — GLYCOPYRROLATE PF 0.2 MG/ML IJ SOSY
PREFILLED_SYRINGE | INTRAMUSCULAR | Status: DC | PRN
  Administered 2017-09-17: .2 mg via INTRAVENOUS

## 2017-09-17 MED ORDER — MEPERIDINE HCL 50 MG/ML IJ SOLN: 6.2500 mg | INTRAMUSCULAR | Status: DC | PRN

## 2017-09-17 MED ORDER — PHENYLEPHRINE 40 MCG/ML (10ML) SYRINGE FOR IV PUSH (FOR BLOOD PRESSURE SUPPORT)
PREFILLED_SYRINGE | INTRAVENOUS | Status: AC
  Filled 2017-09-17: qty 10

## 2017-09-17 MED ORDER — PROPOFOL 10 MG/ML IV BOLUS
INTRAVENOUS | Status: AC
  Filled 2017-09-17: qty 20

## 2017-09-17 MED ORDER — CHLORHEXIDINE GLUCONATE 4 % EX LIQD: 60.0000 mL | Freq: Once | CUTANEOUS | Status: DC

## 2017-09-17 MED ORDER — DEXAMETHASONE SODIUM PHOSPHATE 10 MG/ML IJ SOLN
INTRAMUSCULAR | Status: DC | PRN
  Administered 2017-09-17: 4 mg via INTRAVENOUS

## 2017-09-17 MED ORDER — CEFAZOLIN SODIUM-DEXTROSE 2-4 GM/100ML-% IV SOLN
2.0000 g | INTRAVENOUS | Status: AC
  Administered 2017-09-17: 2 g via INTRAVENOUS

## 2017-09-17 MED ORDER — FENTANYL CITRATE (PF) 250 MCG/5ML IJ SOLN
INTRAMUSCULAR | Status: DC | PRN
  Administered 2017-09-17: 50 ug via INTRAVENOUS
  Administered 2017-09-17 (×2): 25 ug via INTRAVENOUS

## 2017-09-17 MED ORDER — EPHEDRINE 5 MG/ML INJ
INTRAVENOUS | Status: AC
  Filled 2017-09-17: qty 10

## 2017-09-17 MED ORDER — 0.9 % SODIUM CHLORIDE (POUR BTL) OPTIME
TOPICAL | Status: DC | PRN
  Administered 2017-09-17: 1000 mL

## 2017-09-17 MED ORDER — ONDANSETRON HCL 4 MG/2ML IJ SOLN
INTRAMUSCULAR | Status: AC
  Filled 2017-09-17: qty 2

## 2017-09-17 MED ORDER — PROPOFOL 10 MG/ML IV BOLUS
INTRAVENOUS | Status: DC | PRN
  Administered 2017-09-17: 120 mg via INTRAVENOUS
  Administered 2017-09-17: 30 mg via INTRAVENOUS
  Administered 2017-09-17: 50 mg via INTRAVENOUS

## 2017-09-17 MED ORDER — CEFAZOLIN SODIUM-DEXTROSE 2-4 GM/100ML-% IV SOLN
INTRAVENOUS | Status: AC
  Filled 2017-09-17: qty 100

## 2017-09-17 MED ORDER — FENTANYL CITRATE (PF) 250 MCG/5ML IJ SOLN
INTRAMUSCULAR | Status: AC
  Filled 2017-09-17: qty 5

## 2017-09-17 MED ORDER — LIDOCAINE 2% (20 MG/ML) 5 ML SYRINGE
INTRAMUSCULAR | Status: DC | PRN
  Administered 2017-09-17: 60 mg via INTRAVENOUS

## 2017-09-17 MED ORDER — PHENYLEPHRINE 40 MCG/ML (10ML) SYRINGE FOR IV PUSH (FOR BLOOD PRESSURE SUPPORT)
PREFILLED_SYRINGE | INTRAVENOUS | Status: DC | PRN
  Administered 2017-09-17: 80 ug via INTRAVENOUS

## 2017-09-17 MED ORDER — SODIUM CHLORIDE 0.9 % IV SOLN
INTRAVENOUS | Status: DC
  Administered 2017-09-17: 11:00:00 via INTRAVENOUS

## 2017-09-17 MED ORDER — SODIUM CHLORIDE 0.9 % IV SOLN
INTRAVENOUS | Status: DC | PRN
  Administered 2017-09-17: 500 mL

## 2017-09-17 MED ORDER — EPHEDRINE 5 MG/ML INJ
INTRAVENOUS | Status: AC
  Filled 2017-09-17: qty 20

## 2017-09-17 MED ORDER — ONDANSETRON HCL 4 MG/2ML IJ SOLN
INTRAMUSCULAR | Status: DC | PRN
  Administered 2017-09-17: 4 mg via INTRAVENOUS

## 2017-09-17 MED ORDER — LIDOCAINE-EPINEPHRINE 0.5 %-1:200000 IJ SOLN
INTRAMUSCULAR | Status: AC
  Filled 2017-09-17: qty 1

## 2017-09-17 MED ORDER — FENTANYL CITRATE (PF) 100 MCG/2ML IJ SOLN: 25.0000 ug | INTRAMUSCULAR | Status: DC | PRN

## 2017-09-17 SURGICAL SUPPLY — 30 items
ARMBAND PINK RESTRICT EXTREMIT (MISCELLANEOUS) ×3 IMPLANT
CANISTER SUCT 3000ML PPV (MISCELLANEOUS) ×3 IMPLANT
CANNULA VESSEL 3MM 2 BLNT TIP (CANNULA) ×3 IMPLANT
CLIP LIGATING EXTRA MED SLVR (CLIP) ×3 IMPLANT
CLIP LIGATING EXTRA SM BLUE (MISCELLANEOUS) ×3 IMPLANT
COVER PROBE W GEL 5X96 (DRAPES) ×3 IMPLANT
DECANTER SPIKE VIAL GLASS SM (MISCELLANEOUS) ×3 IMPLANT
DERMABOND ADVANCED (GAUZE/BANDAGES/DRESSINGS) ×2
DERMABOND ADVANCED .7 DNX12 (GAUZE/BANDAGES/DRESSINGS) ×1 IMPLANT
ELECT REM PT RETURN 9FT ADLT (ELECTROSURGICAL) ×3
ELECTRODE REM PT RTRN 9FT ADLT (ELECTROSURGICAL) ×1 IMPLANT
GLOVE SS BIOGEL STRL SZ 7.5 (GLOVE) ×1 IMPLANT
GLOVE SUPERSENSE BIOGEL SZ 7.5 (GLOVE) ×2
GLOVE SURG SS PI 7.0 STRL IVOR (GLOVE) ×6 IMPLANT
GOWN STRL REUS W/ TWL LRG LVL3 (GOWN DISPOSABLE) ×3 IMPLANT
GOWN STRL REUS W/TWL LRG LVL3 (GOWN DISPOSABLE) ×6
KIT BASIN OR (CUSTOM PROCEDURE TRAY) ×3 IMPLANT
KIT TURNOVER KIT B (KITS) ×3 IMPLANT
NS IRRIG 1000ML POUR BTL (IV SOLUTION) ×3 IMPLANT
PACK CV ACCESS (CUSTOM PROCEDURE TRAY) ×3 IMPLANT
PAD ARMBOARD 7.5X6 YLW CONV (MISCELLANEOUS) ×6 IMPLANT
SUT PROLENE 6 0 CC (SUTURE) ×6 IMPLANT
SUT SILK 2 0 SH (SUTURE) IMPLANT
SUT SILK 4 0 (SUTURE) ×2
SUT SILK 4-0 18XBRD TIE 12 (SUTURE) ×1 IMPLANT
SUT VIC AB 3-0 SH 27 (SUTURE) ×2
SUT VIC AB 3-0 SH 27X BRD (SUTURE) ×1 IMPLANT
TOWEL GREEN STERILE (TOWEL DISPOSABLE) ×3 IMPLANT
UNDERPAD 30X30 (UNDERPADS AND DIAPERS) ×3 IMPLANT
WATER STERILE IRR 1000ML POUR (IV SOLUTION) ×3 IMPLANT

## 2017-09-17 NOTE — Anesthesia Preprocedure Evaluation (Addendum)
Anesthesia Evaluation  Patient identified by MRN, date of birth, ID band Patient awake    Reviewed: Allergy & Precautions, NPO status , Patient's Chart, lab work & pertinent test results  History of Anesthesia Complications (+) history of anesthetic complications  Airway Mallampati: II  TM Distance: >3 FB Neck ROM: Full    Dental  (+) Edentulous Upper, Edentulous Lower, Dental Advisory Given   Pulmonary COPD,  COPD inhaler, former smoker,    breath sounds clear to auscultation       Cardiovascular hypertension, Pt. on medications and Pt. on home beta blockers + Past MI and +CHF  + dysrhythmias Atrial Fibrillation  Rhythm:Regular Rate:Normal  Echo 9/18 Impressions:  - Mild LVH with LVEF 60-65% and grade 2 diastolic dysfunction.   Moderate left atrial enlargement. Mild calcified mitral annulus   with trivial mitral regurgitation. Moderately sclerotic aortic   valve without stenosis. Trivial aortic regurgitation. Trivial   tricuspid regurgitation.    Neuro/Psych TIACVA, Residual Symptoms    GI/Hepatic   Endo/Other  diabetes, Type 2  Renal/GU ESRF and DialysisRenal disease     Musculoskeletal  (+) Arthritis , Osteoarthritis,    Abdominal   Peds  Hematology  (+) anemia ,   Anesthesia Other Findings   Reproductive/Obstetrics                             Anesthesia Physical  Anesthesia Plan  ASA: III  Anesthesia Plan: General   Post-op Pain Management:    Induction: Intravenous  PONV Risk Score and Plan: 3 and Ondansetron and Treatment may vary due to age or medical condition  Airway Management Planned: LMA  Additional Equipment:   Intra-op Plan:   Post-operative Plan:   Informed Consent: I have reviewed the patients History and Physical, chart, labs and discussed the procedure including the risks, benefits and alternatives for the proposed anesthesia with the patient or  authorized representative who has indicated his/her understanding and acceptance.   Dental Advisory Given  Plan Discussed with: CRNA, Anesthesiologist and Surgeon  Anesthesia Plan Comments: ( )       Anesthesia Quick Evaluation

## 2017-09-17 NOTE — Anesthesia Postprocedure Evaluation (Signed)
Anesthesia Post Note  Patient: Jody [name]  Procedure(s) Performed: SECOND STAGE BASILIC VEIN TRANSPOSITION RIGHT ARM (Right Arm Upper)     Patient location during evaluation: PACU Anesthesia Type: General Level of consciousness: awake Pain management: pain level controlled Vital Signs Assessment: post-procedure vital signs reviewed and stable Respiratory status: spontaneous breathing Cardiovascular status: stable Anesthetic complications: no    Last Vitals:  Vitals:   09/17/17 1615 09/17/17 1636  BP:  (!) 163/75  Pulse: 66 60  Resp: 13 12  Temp: 36.5 C   SpO2: 99% 97%    Last Pain:  Vitals:   09/17/17 1530  TempSrc:   PainSc: 0-No pain                 Deunte Bledsoe

## 2017-09-17 NOTE — Discharge Instructions (Signed)
° °  [facility]  Discharge Instructions  AV Fistula or Graft Surgery for Dialysis Access  Please refer to the following instructions for your post-procedure care. Your surgeon or physician assistant will discuss any changes with you.  Activity  You may drive the day following your surgery, if you are comfortable and no longer taking prescription pain medication. Resume full activity as the soreness in your incision resolves.  Bathing/Showering  You may shower after you go home. Keep your incision dry for 48 hours. Do not soak in a bathtub, hot tub, or swim until the incision heals completely. You may not shower if you have a hemodialysis catheter.  Incision Care  Clean your incision with mild soap and water after 48 hours. Pat the area dry with a clean towel. You do not need a bandage unless otherwise instructed. Do not apply any ointments or creams to your incision. You may have skin glue on your incision. Do not peel it off. It will come off on its own in about one week. Your arm may swell a bit after surgery. To reduce swelling use pillows to elevate your arm so it is above your heart. Your doctor will tell you if you need to lightly wrap your arm with an ACE bandage.  Diet  Resume your normal diet. There are not special food restrictions following this procedure. In order to heal from your surgery, it is CRITICAL to get adequate nutrition. Your body requires vitamins, minerals, and protein. Vegetables are the best source of vitamins and minerals. Vegetables also provide the perfect balance of protein. Processed food has little nutritional value, so try to avoid this.  Medications  Resume taking all of your medications. If your incision is causing pain, you may take over-the counter pain relievers such as acetaminophen (Tylenol). If you were prescribed a stronger pain medication, please be aware these medications can cause nausea and constipation. Prevent  nausea by taking the medication with a snack or meal. Avoid constipation by drinking plenty of fluids and eating foods with high amount of fiber, such as fruits, vegetables, and grains.  Do not take Tylenol if you are taking prescription pain medications.  Resume your Eliquis in the morning (09/18/17)  Follow up Your surgeon may want to see you in the office following your access surgery. If so, this will be arranged at the time of your surgery.  Please call us immediately for any of the following conditions:  Increased pain, redness, drainage (pus) from your incision site Fever of 101 degrees or higher Severe or worsening pain at your incision site Hand pain or numbness.  Reduce your risk of vascular disease:  Stop smoking. If you would like help, call [facility] at 1-800-QUIT-NOW 580-849-2726) or [place] at [place] your cholesterol Maintain a desired weight Control your diabetes Keep your blood pressure down  Dialysis  It will take several weeks to several months for your new dialysis access to be ready for use. Your surgeon will determine when it is okay to use it. Your nephrologist will continue to direct your dialysis. You can continue to use your Permcath until your new access is ready for use.   09/17/2017 Sister Carbone [id number] 26-Aug-1940  Surgeon(s): Early, Arvilla Meres, MD  Procedure(s): SECOND STAGE BASILIC VEIN TRANSPOSITION RIGHT ARM  x Do not stick fistula for 6 weeks    If you have any questions, please call the office at [phone].

## 2017-09-17 NOTE — Transfer of Care (Signed)
Immediate Anesthesia Transfer of Care Note  Patient: Jody [name]  Procedure(s) Performed: SECOND STAGE BASILIC VEIN TRANSPOSITION RIGHT ARM (Right Arm Upper)  Patient Location: PACU  Anesthesia Type:General  Level of Consciousness: awake, alert , oriented and patient cooperative  Airway & Oxygen Therapy: Patient Spontanous Breathing and Patient connected to face mask oxygen  Post-op Assessment: Report given to RN and Post -op Vital signs reviewed and stable  Post vital signs: Reviewed and stable  Last Vitals:  Vitals Value Taken Time  BP 151/67 09/17/2017  3:07 PM  Temp    Pulse 65 09/17/2017  3:09 PM  Resp 10 09/17/2017  3:09 PM  SpO2 99 % 09/17/2017  3:09 PM  Vitals shown include unvalidated device data.  Last Pain:  Vitals:   09/17/17 1120  TempSrc:   PainSc: 0-No pain         Complications: No apparent anesthesia complications

## 2017-09-17 NOTE — Interval H&P Note (Signed)
History and Physical Interval Note:  09/17/2017 12:15 PM  Jody [name]  has presented today for surgery, with the diagnosis of END STAGE RENAL DISEASE FOR HEMODIALYSIS ACCESS  The various methods of treatment have been discussed with the patient and family. After consideration of risks, benefits and other options for treatment, the patient has consented to  Procedure(s): SECOND STAGE BASILIC VEIN TRANSPOSITION RIGHT ARM (Right) as a surgical intervention .  The patient's history has been reviewed, patient examined, no change in status, stable for surgery.  I have reviewed the patient's chart and labs.  Questions were answered to the patient's satisfaction.     Curt Jews

## 2017-09-17 NOTE — Op Note (Signed)
    OPERATIVE REPORT  DATE OF SURGERY: 09/17/2017  PATIENT: Jody [name], 77 y.o. female MRN: [id number]  DOB: 01-Jul-1940  PRE-OPERATIVE DIAGNOSIS: End-stage renal disease  POST-OPERATIVE DIAGNOSIS:  Same  PROCEDURE: Right second stage basilic vein transposition fistula  SURGEON:  Curt Jews, M.D.  PHYSICIAN ASSISTANT: Liana Crocker, PA-C  ANESTHESIA: LMA  EBL: per anesthesia record  Total I/O In: 250 [I.V.:250] Out: 10 [Blood:10]  BLOOD ADMINISTERED: none  DRAINS: none  SPECIMEN: none  COUNTS CORRECT:  YES  PATIENT DISPOSITION:  PACU - hemodynamically stable  PROCEDURE DETAILS: Patient was taken to the operative placed supine position where the area of the right arm prepped and draped in usual sterile fashion.  SonoSite was used to mark the level of the basilic vein.  Incision was made through the prior scar at the antecubital space and carried down through the subcutaneous tissue to the level of the brachial artery to basilic vein and.  2 separate incisions were made in the mid upper arm and towards the axilla and the basilic vein was mobilized.  Tributary branches were ligated with 3-0 and 4-0 silk ties and divided.  The vein was mobilized all the way to the axilla.  The vein was occluded near the brachial artery anastomosis and was transected.  The vein was marked to reduce risk of twisting.  A tunnel was created from the level of the antecubital space to the axillary incision and the vein was brought back through this tunnel.  This was placed quite close to the skin part.  The vein was spatulated near the old arterial anastomosis and was sewn into into itself with a running 6-0 Prolene suture.  Clamps removed and excellent thrill was noted.  Wounds irrigated with saline.  Hemostasis talus cautery.  Wounds were closed with 3-0 Vicryl in the subcutaneous and subcuticular tissue.  Sterile dressing was applied.  The patient did maintain a radial pulse.   Rosetta Posner, M.D.,  [facility] 09/17/2017 3:11 PM

## 2017-09-17 NOTE — Anesthesia Procedure Notes (Signed)
Procedure Name: LMA Insertion Date/Time: 09/17/2017 11:56 AM Performed by: Renato Shin, CRNA Pre-anesthesia Checklist: Patient identified, Emergency Drugs available, Suction available and Patient being monitored Patient Re-evaluated:Patient Re-evaluated prior to induction Oxygen Delivery Method: Circle system utilized Preoxygenation: Pre-oxygenation with 100% oxygen Induction Type: IV induction LMA: LMA inserted LMA Size: 5.0 Number of attempts: 2 Placement Confirmation: positive ETCO2,  CO2 detector and breath sounds checked- equal and bilateral Tube secured with: Tape Dental Injury: Teeth and Oropharynx as per pre-operative assessment

## 2017-09-18 ENCOUNTER — Encounter (HOSPITAL_COMMUNITY): Payer: Self-pay | Admitting: Vascular Surgery

## 2017-09-18 ENCOUNTER — Telehealth: Payer: Self-pay | Admitting: Vascular Surgery

## 2017-09-18 NOTE — Telephone Encounter (Signed)
Called pt. vm not available  TFE 2/3wks p/o PA s/p 2nd stg BVT  10/11/17 3pm

## 2017-09-21 ENCOUNTER — Encounter: Payer: Self-pay | Admitting: Family

## 2017-09-21 ENCOUNTER — Ambulatory Visit (INDEPENDENT_AMBULATORY_CARE_PROVIDER_SITE_OTHER): Payer: Self-pay | Admitting: Family

## 2017-09-21 VITALS — BP 157/70 | HR 79 | Temp 97.5°F | Resp 18 | Ht 67.0 in | Wt 205.3 lb

## 2017-09-21 DIAGNOSIS — I770 Arteriovenous fistula, acquired: Secondary | ICD-10-CM

## 2017-09-21 DIAGNOSIS — Z992 Dependence on renal dialysis: Secondary | ICD-10-CM

## 2017-09-21 DIAGNOSIS — N186 End stage renal disease: Secondary | ICD-10-CM

## 2017-09-21 DIAGNOSIS — T148XXA Other injury of unspecified body region, initial encounter: Secondary | ICD-10-CM

## 2017-09-21 NOTE — Progress Notes (Signed)
Postoperative Access Visit   History of Present Illness  Jody [name] is a 77 y.o. year old female who is s/p second stage basilic vein transposition for AVF of right upper arm on 09-17-17 by Dr. Donnetta Hutching.  She returns today for incision check right arm, HD center has concerns re bleeding from right upper arm incision.  Pt states it stopped bleeding today.  She denies fever or chills, denies pain other than in her right shoulder that has been present since she had the right upper chest temporary catheter inserted.  She denies steal sx's in her right hands.   She takes Eliquis, has a hx of atrial fib.   She has a 2-3 weeks post-op appointment with a PA on 10-11-17, but that is a Thursday and she has HD. [name]   She dialyzes via right upper chest catheter on T[facility]TH-S at [facility].   Tobacco use: quit in 2005, smoked x 50 years.  Diabetic: yes, however, last A1C result on file was normal at 4.7. On 10-15-16  The patient is able to complete their activities of daily living.     For VQI Use Only  PRE-ADM LIVING: Home  AMB STATUS: Ambulatory with Assistance   Past Medical History:  Diagnosis Date  . Anemia in chronic kidney disease (CODE) 06/05/2015  . Arthritis    "bad in my legs" (12/07/2014)  . Atrial fibrillation ([place]) 06/05/2015  . Chronic back pain    "dr said my spine is crooked"  . Chronic bronchitis ([place])    "get it q yr" (12/07/2014)  . CKD stage 4 due to type 2 diabetes mellitus ([place]) 06/05/2015  . Complication of anesthesia    headache for 2 days after surgery in May 2019  . COPD (chronic obstructive pulmonary disease) ([place])   . Gait difficulty 09/02/2014  . Heart disease   . Hypercholesterolemia   . Hypertension   . Neuropathy 2015   in legs  . Stroke ([place]) 05/2014   denies residual on 12/07/2014  . Stroke with cerebral ischemia ([place])   . Type II diabetes mellitus ([place])     Past Surgical History:  Procedure Laterality Date  . ABDOMINAL HYSTERECTOMY  ~ 1970  .  APPENDECTOMY  ~ 1970  . AV FISTULA PLACEMENT Right 07/06/2017   Procedure: RIGHT RADIOCEPHALIC ARTERIOVENOUS FISTULA creation;  Surgeon: Conrad Joffre, MD;  Location: [facility];  Service: Vascular;  Laterality: Right;  . BASCILIC VEIN TRANSPOSITION Right 09/17/2017   Procedure: SECOND STAGE BASILIC VEIN TRANSPOSITION RIGHT ARM;  Surgeon: Rosetta Posner, MD;  Location: [facility];  Service: Vascular;  Laterality: Right;  . JOINT REPLACEMENT    . TOTAL KNEE ARTHROPLASTY Right ~ 1986  . TUMOR EXCISION  ~ 1970   "9# in my stomach"    Family History  Problem Relation Age of Onset  . Cancer Other   . Heart attack Brother   . Cancer Sister        breast  . Alcohol abuse Brother     Social History   Socioeconomic History  . Marital status: Widowed    Spouse name: Not on file  . Number of children: 0  . Years of education: 9  . Highest education level: Not on file  Occupational History  . Occupation: retired  Scientific laboratory technician  . Financial resource strain: Not on file  . Food insecurity:    Worry: Not on file    Inability: Not on file  . Transportation needs:  Medical: Not on file    Non-medical: Not on file  Tobacco Use  . Smoking status: Former Smoker    Packs/day: 0.50    Years: 50.00    Pack years: 25.00    Types: Cigarettes    Start date: 03/07/1953    Last attempt to quit: 01/14/2004    Years since quitting: 13.6  . Smokeless tobacco: Never Used  . Tobacco comment: "quit smoking cigarettes  in ~ 2005"  Substance and Sexual Activity  . Alcohol use: No    Alcohol/week: 0.0 standard drinks    Comment: 10/242016 "quit drinking beer in ~ 1977"  . Drug use: No  . Sexual activity: Never  Lifestyle  . Physical activity:    Days per week: Not on file    Minutes per session: Not on file  . Stress: Not on file  Relationships  . Social connections:    Talks on phone: Not on file    Gets together: Not on file    Attends religious service: Not on file    Active member of club or  organization: Not on file    Attends meetings of clubs or organizations: Not on file    Relationship status: Not on file  . Intimate partner violence:    Fear of current or ex partner: Not on file    Emotionally abused: Not on file    Physically abused: Not on file    Forced sexual activity: Not on file  Other Topics Concern  . Not on file  Social History Narrative   Patient drinks caffeine a few times a week.   Patient is right handed.    Allergies  Allergen Reactions  . Codeine Nausea And Vomiting    Current Outpatient Medications on File Prior to Visit  Medication Sig Dispense Refill  . albuterol (PROVENTIL HFA;VENTOLIN HFA) 108 (90 BASE) MCG/ACT inhaler Inhale 1-2 puffs into the lungs every 6 (six) hours as needed for wheezing or shortness of breath. Reported on 03/01/2015    . aspirin EC 81 MG tablet Take 81 mg by mouth daily.    [name] atorvastatin (LIPITOR) 40 MG tablet Take 1 tablet by mouth at bedtime.   0  . cilostazol (PLETAL) 50 MG tablet Take 50 mg by mouth 2 (two) times daily.  0  . diltiazem (DILACOR XR) 180 MG 24 hr capsule Take 180 mg by mouth daily.    [name] ELIQUIS 2.5 MG TABS tablet Take 1 tablet (2.5 mg total) by mouth 2 (two) times daily. 60 tablet 3  . fluticasone-salmeterol (ADVAIR HFA) 230-21 MCG/ACT inhaler Inhale 2 puffs into the lungs 2 (two) times daily.    [name] gabapentin (NEURONTIN) 600 MG tablet Take 600 mg by mouth 3 (three) times daily.    . hydrALAZINE (APRESOLINE) 100 MG tablet TAKE ONE (1) TABLET THREE (3) TIMES EACH DAY 270 tablet 0  . Multiple Vitamin (MULTIVITAMIN WITH MINERALS) TABS tablet Take 1 tablet by mouth daily.    [name] oxyCODONE-acetaminophen (PERCOCET/ROXICET) 5-325 MG tablet Take 1 tablet by mouth every 6 (six) hours as needed. 12 tablet 0  . polyethylene glycol (MIRALAX / GLYCOLAX) packet Take 17 g by mouth daily as needed (constipation).    . propranolol (INDERAL) 10 MG tablet Take 10 mg by mouth 2 (two) times daily.    . sodium bicarbonate 650  MG tablet Take 1 tablet (650 mg total) by mouth 2 (two) times daily. 60 tablet 1  . TOREMIFENE CITRATE PO Take 40 mg by  mouth See admin instructions. Take 40 mg by mouth once daily on non-dialysis Sun, Mon, Wed, and Fri.    [name] amLODipine (NORVASC) 10 MG tablet Take 1 tablet (10 mg total) by mouth daily. (Patient not taking: Reported on 09/03/2017) 30 tablet 1  . metoprolol tartrate (LOPRESSOR) 25 MG tablet Take 0.5 tablets (12.5 mg total) by mouth 2 (two) times daily. (Patient not taking: Reported on 09/21/2017) 30 tablet 0   No current facility-administered medications on file prior to visit.       Physical Examination Vitals:   09/21/17 1312  BP: (!) 157/70  Pulse: 79  Resp: 18  Temp: (!) 97.5 F (36.4 C)  TempSrc: Oral  SpO2: 95%  Weight: 205 lb 4.8 oz (93.1 kg)  Height: 5\' 7"  (1.702 m)   Body mass index is 32.15 kg/m.    Right upper arm AV fistula   Right radial pulse is 1+ palpable. Right hand grip is 5/5, sensation is intact. + bruit left upper arm AVF.  Slight separation of incision, see photo above.   Medical Decision Making  Jody [name] is a 77 y.o. year old female who presents s/p second stage basilic vein transposition for AVF of right upper arm on 09-17-17.  She takes Eliquis, and had some bleeding from her right upper arm incision that her HD center was was concerned about. Pt states her arm looks better to day than yesterday. She has no fever or chills, no steal sx's, good bruit in AVF.  Bruising is expected after this procedure, especially with a pt taking Eliquis.  The incision is not bleeding now.  I discussed with Dr. Donzetta Matters the above.  Pt has HD on 8-29, will need to change her follow up day to a non HD day. She dialyze T-TH-S.   Thank you for allowing [place] to participate in this patient's care.  Clemon Chambers, RN, MSN, FNP-C [facility] Office: [phone]  09/21/2017, 1:52 PM  Clinic MD: Donzetta Matters

## 2017-10-17 ENCOUNTER — Other Ambulatory Visit: Payer: Self-pay

## 2017-10-17 ENCOUNTER — Ambulatory Visit (INDEPENDENT_AMBULATORY_CARE_PROVIDER_SITE_OTHER): Payer: Self-pay | Admitting: Physician Assistant

## 2017-10-17 VITALS — BP 130/40 | HR 67 | Temp 97.5°F | Resp 16 | Ht 67.0 in | Wt 213.0 lb

## 2017-10-17 DIAGNOSIS — N186 End stage renal disease: Secondary | ICD-10-CM

## 2017-10-17 DIAGNOSIS — Z992 Dependence on renal dialysis: Secondary | ICD-10-CM

## 2017-10-17 NOTE — Progress Notes (Signed)
POST OPERATIVE OFFICE NOTE    CC:  F/u for surgery  HPI:  This is a 77 y.o. female who is s/p Right second stage basilic vein transposition fistula on 09/17/2017 by Dr. Donnetta Hutching.   She is status post right arm first stage basilic vein transposition by Dr. Bridgett Larsson on 07/06/2017.  She is dialyzing from her left IJ tunneled dialysis catheter on a Tuesday Thursday Saturday schedule without complication.  She denise pain, loss of sensation and loss of motor in the right UE.  She denise fever and chills.  There have been no changes in her medical history since she was last seen.    Allergies  Allergen Reactions  . Codeine Nausea And Vomiting    Current Outpatient Medications  Medication Sig Dispense Refill  . albuterol (PROVENTIL HFA;VENTOLIN HFA) 108 (90 BASE) MCG/ACT inhaler Inhale 1-2 puffs into the lungs every 6 (six) hours as needed for wheezing or shortness of breath. Reported on 03/01/2015    . amLODipine (NORVASC) 10 MG tablet Take 1 tablet (10 mg total) by mouth daily. 30 tablet 1  . aspirin EC 81 MG tablet Take 81 mg by mouth daily.    [name] atorvastatin (LIPITOR) 40 MG tablet Take 1 tablet by mouth at bedtime.   0  . cilostazol (PLETAL) 50 MG tablet Take 50 mg by mouth 2 (two) times daily.  0  . diltiazem (DILACOR XR) 180 MG 24 hr capsule Take 180 mg by mouth daily.    [name] ELIQUIS 2.5 MG TABS tablet Take 1 tablet (2.5 mg total) by mouth 2 (two) times daily. 60 tablet 3  . fluticasone-salmeterol (ADVAIR HFA) 230-21 MCG/ACT inhaler Inhale 2 puffs into the lungs 2 (two) times daily.    [name] gabapentin (NEURONTIN) 600 MG tablet Take 600 mg by mouth 3 (three) times daily.    . hydrALAZINE (APRESOLINE) 100 MG tablet TAKE ONE (1) TABLET THREE (3) TIMES EACH DAY 270 tablet 0  . metoprolol tartrate (LOPRESSOR) 25 MG tablet Take 0.5 tablets (12.5 mg total) by mouth 2 (two) times daily. 30 tablet 0  . Multiple Vitamin (MULTIVITAMIN WITH MINERALS) TABS tablet Take 1 tablet by mouth daily.    . polyethylene  glycol (MIRALAX / GLYCOLAX) packet Take 17 g by mouth daily as needed (constipation).    . propranolol (INDERAL) 10 MG tablet Take 10 mg by mouth 2 (two) times daily.    . sodium bicarbonate 650 MG tablet Take 1 tablet (650 mg total) by mouth 2 (two) times daily. 60 tablet 1  . TOREMIFENE CITRATE PO Take 40 mg by mouth See admin instructions. Take 40 mg by mouth once daily on non-dialysis Sun, Mon, Wed, and Fri.    . oxyCODONE-acetaminophen (PERCOCET/ROXICET) 5-325 MG tablet Take 1 tablet by mouth every 6 (six) hours as needed. (Patient not taking: Reported on 10/17/2017) 12 tablet 0   No current facility-administered medications for this visit.      ROS:  See HPI  Physical Exam:  Vitals:   10/17/17 1300  BP: (!) 130/40  Pulse: 67  Resp: 16  Temp: (!) 97.5 F (36.4 C)  SpO2: 97%    Incision:  Well healed with mild upper arm edema, NTTP, soft. Extremities:  Palpable thrill in fistula, grip 5/5, sensation intact and equal B. Heart: RRR Abdomen:  + BS  Assessment/Plan:  This is a 77 y.o. female who is s/p: Basilic av fistula transposition with easily palpable thrill.    The fistula is well developed and may be  used for HD starting now 10/17/2017.  She will f/u as needed in the future.  Her Nephrologist will schedule her an appt. To have the [facility] removed once the fistula is fully functioning.   Roxy Horseman , PA-C [facility] [phone]

## 2017-10-26 ENCOUNTER — Inpatient Hospital Stay (HOSPITAL_COMMUNITY): Payer: Medicare Other

## 2017-10-26 ENCOUNTER — Other Ambulatory Visit: Payer: Self-pay

## 2017-10-26 ENCOUNTER — Encounter (HOSPITAL_COMMUNITY): Payer: Self-pay | Admitting: Emergency Medicine

## 2017-10-26 ENCOUNTER — Inpatient Hospital Stay (HOSPITAL_COMMUNITY)
Admission: EM | Admit: 2017-10-26 | Discharge: 2017-10-31 | DRG: 377 | Disposition: A | Discharge status: Home Health | Payer: Medicare Other | Attending: Internal Medicine | Admitting: Internal Medicine

## 2017-10-26 DIAGNOSIS — I4891 Unspecified atrial fibrillation: Secondary | ICD-10-CM | POA: Diagnosis present

## 2017-10-26 DIAGNOSIS — I482 Chronic atrial fibrillation, unspecified: Secondary | ICD-10-CM | POA: Diagnosis present

## 2017-10-26 DIAGNOSIS — Z79899 Other long term (current) drug therapy: Secondary | ICD-10-CM | POA: Diagnosis not present

## 2017-10-26 DIAGNOSIS — D62 Acute posthemorrhagic anemia: Secondary | ICD-10-CM | POA: Diagnosis present

## 2017-10-26 DIAGNOSIS — Z7901 Long term (current) use of anticoagulants: Secondary | ICD-10-CM

## 2017-10-26 DIAGNOSIS — E1122 Type 2 diabetes mellitus with diabetic chronic kidney disease: Secondary | ICD-10-CM | POA: Diagnosis present

## 2017-10-26 DIAGNOSIS — E1121 Type 2 diabetes mellitus with diabetic nephropathy: Secondary | ICD-10-CM | POA: Diagnosis present

## 2017-10-26 DIAGNOSIS — K5730 Diverticulosis of large intestine without perforation or abscess without bleeding: Secondary | ICD-10-CM | POA: Diagnosis not present

## 2017-10-26 DIAGNOSIS — S93401A Sprain of unspecified ligament of right ankle, initial encounter: Secondary | ICD-10-CM | POA: Diagnosis present

## 2017-10-26 DIAGNOSIS — M1990 Unspecified osteoarthritis, unspecified site: Secondary | ICD-10-CM | POA: Diagnosis not present

## 2017-10-26 DIAGNOSIS — Z7982 Long term (current) use of aspirin: Secondary | ICD-10-CM

## 2017-10-26 DIAGNOSIS — J449 Chronic obstructive pulmonary disease, unspecified: Secondary | ICD-10-CM | POA: Diagnosis present

## 2017-10-26 DIAGNOSIS — R0602 Shortness of breath: Secondary | ICD-10-CM

## 2017-10-26 DIAGNOSIS — K319 Disease of stomach and duodenum, unspecified: Secondary | ICD-10-CM | POA: Diagnosis present

## 2017-10-26 DIAGNOSIS — E7800 Pure hypercholesterolemia, unspecified: Secondary | ICD-10-CM | POA: Diagnosis present

## 2017-10-26 DIAGNOSIS — Z7951 Long term (current) use of inhaled steroids: Secondary | ICD-10-CM

## 2017-10-26 DIAGNOSIS — I5043 Acute on chronic combined systolic (congestive) and diastolic (congestive) heart failure: Secondary | ICD-10-CM | POA: Diagnosis present

## 2017-10-26 DIAGNOSIS — K269 Duodenal ulcer, unspecified as acute or chronic, without hemorrhage or perforation: Secondary | ICD-10-CM | POA: Diagnosis present

## 2017-10-26 DIAGNOSIS — K5909 Other constipation: Secondary | ICD-10-CM | POA: Diagnosis present

## 2017-10-26 DIAGNOSIS — N186 End stage renal disease: Secondary | ICD-10-CM | POA: Diagnosis present

## 2017-10-26 DIAGNOSIS — X58XXXA Exposure to other specified factors, initial encounter: Secondary | ICD-10-CM | POA: Diagnosis present

## 2017-10-26 DIAGNOSIS — I5042 Chronic combined systolic (congestive) and diastolic (congestive) heart failure: Secondary | ICD-10-CM | POA: Diagnosis present

## 2017-10-26 DIAGNOSIS — Z96651 Presence of right artificial knee joint: Secondary | ICD-10-CM | POA: Diagnosis present

## 2017-10-26 DIAGNOSIS — I5032 Chronic diastolic (congestive) heart failure: Secondary | ICD-10-CM | POA: Diagnosis present

## 2017-10-26 DIAGNOSIS — I132 Hypertensive heart and chronic kidney disease with heart failure and with stage 5 chronic kidney disease, or end stage renal disease: Secondary | ICD-10-CM | POA: Diagnosis present

## 2017-10-26 DIAGNOSIS — D500 Iron deficiency anemia secondary to blood loss (chronic): Secondary | ICD-10-CM | POA: Diagnosis present

## 2017-10-26 DIAGNOSIS — K5731 Diverticulosis of large intestine without perforation or abscess with bleeding: Principal | ICD-10-CM | POA: Diagnosis present

## 2017-10-26 DIAGNOSIS — D631 Anemia in chronic kidney disease: Secondary | ICD-10-CM | POA: Diagnosis present

## 2017-10-26 DIAGNOSIS — Z8673 Personal history of transient ischemic attack (TIA), and cerebral infarction without residual deficits: Secondary | ICD-10-CM

## 2017-10-26 DIAGNOSIS — R51 Headache: Secondary | ICD-10-CM | POA: Diagnosis present

## 2017-10-26 DIAGNOSIS — I10 Essential (primary) hypertension: Secondary | ICD-10-CM | POA: Diagnosis present

## 2017-10-26 DIAGNOSIS — Z87891 Personal history of nicotine dependence: Secondary | ICD-10-CM

## 2017-10-26 DIAGNOSIS — I5033 Acute on chronic diastolic (congestive) heart failure: Secondary | ICD-10-CM | POA: Diagnosis present

## 2017-10-26 DIAGNOSIS — Z90710 Acquired absence of both cervix and uterus: Secondary | ICD-10-CM

## 2017-10-26 DIAGNOSIS — Z5309 Procedure and treatment not carried out because of other contraindication: Secondary | ICD-10-CM | POA: Diagnosis not present

## 2017-10-26 DIAGNOSIS — R945 Abnormal results of liver function studies: Secondary | ICD-10-CM

## 2017-10-26 DIAGNOSIS — R269 Unspecified abnormalities of gait and mobility: Secondary | ICD-10-CM | POA: Diagnosis not present

## 2017-10-26 DIAGNOSIS — Z992 Dependence on renal dialysis: Secondary | ICD-10-CM

## 2017-10-26 DIAGNOSIS — K3189 Other diseases of stomach and duodenum: Secondary | ICD-10-CM | POA: Diagnosis not present

## 2017-10-26 DIAGNOSIS — E1159 Type 2 diabetes mellitus with other circulatory complications: Secondary | ICD-10-CM | POA: Diagnosis present

## 2017-10-26 DIAGNOSIS — J438 Other emphysema: Secondary | ICD-10-CM | POA: Diagnosis not present

## 2017-10-26 DIAGNOSIS — K922 Gastrointestinal hemorrhage, unspecified: Secondary | ICD-10-CM

## 2017-10-26 DIAGNOSIS — Z539 Procedure and treatment not carried out, unspecified reason: Secondary | ICD-10-CM | POA: Diagnosis present

## 2017-10-26 DIAGNOSIS — R7989 Other specified abnormal findings of blood chemistry: Secondary | ICD-10-CM | POA: Diagnosis present

## 2017-10-26 DIAGNOSIS — J382 Nodules of vocal cords: Secondary | ICD-10-CM | POA: Diagnosis present

## 2017-10-26 DIAGNOSIS — I480 Paroxysmal atrial fibrillation: Secondary | ICD-10-CM

## 2017-10-26 DIAGNOSIS — I2510 Atherosclerotic heart disease of native coronary artery without angina pectoris: Secondary | ICD-10-CM | POA: Diagnosis present

## 2017-10-26 DIAGNOSIS — D649 Anemia, unspecified: Secondary | ICD-10-CM

## 2017-10-26 DIAGNOSIS — I959 Hypotension, unspecified: Secondary | ICD-10-CM | POA: Diagnosis present

## 2017-10-26 DIAGNOSIS — K644 Residual hemorrhoidal skin tags: Secondary | ICD-10-CM | POA: Diagnosis not present

## 2017-10-26 DIAGNOSIS — R198 Other specified symptoms and signs involving the digestive system and abdomen: Secondary | ICD-10-CM

## 2017-10-26 DIAGNOSIS — K449 Diaphragmatic hernia without obstruction or gangrene: Secondary | ICD-10-CM | POA: Diagnosis not present

## 2017-10-26 LAB — CBC WITH DIFFERENTIAL/PLATELET
Basophils Absolute: 0 10*3/uL (ref 0.0–0.1)
Basophils Relative: 0 %
Eosinophils Absolute: 0.4 10*3/uL (ref 0.0–0.7)
Eosinophils Relative: 7 %
HCT: 22.2 % — ABNORMAL LOW (ref 36.0–46.0)
HEMOGLOBIN: 6.6 g/dL — AB (ref 12.0–15.0)
Lymphocytes Relative: 16 %
Lymphs Abs: 0.8 10*3/uL (ref 0.7–4.0)
MCH: 31 pg (ref 26.0–34.0)
MCHC: 29.7 g/dL — ABNORMAL LOW (ref 30.0–36.0)
MCV: 104.2 fL — ABNORMAL HIGH (ref 78.0–100.0)
Monocytes Absolute: 0.4 10*3/uL (ref 0.1–1.0)
Monocytes Relative: 7 %
Neutro Abs: 3.4 10*3/uL (ref 1.7–7.7)
Neutrophils Relative %: 70 %
Platelets: 204 10*3/uL (ref 150–400)
RBC: 2.13 MIL/uL — AB (ref 3.87–5.11)
RDW: 17.7 % — ABNORMAL HIGH (ref 11.5–15.5)
WBC: 5 10*3/uL (ref 4.0–10.5)

## 2017-10-26 LAB — BASIC METABOLIC PANEL
ANION GAP: 10 (ref 5–15)
BUN: 28 mg/dL — AB (ref 8–23)
CHLORIDE: 99 mmol/L (ref 98–111)
CO2: 29 mmol/L (ref 22–32)
Calcium: 8.5 mg/dL — ABNORMAL LOW (ref 8.9–10.3)
Creatinine, Ser: 3.73 mg/dL — ABNORMAL HIGH (ref 0.44–1.00)
GFR calc non Af Amer: 11 mL/min — ABNORMAL LOW (ref >60)
GFR, EST AFRICAN AMERICAN: 12 mL/min — AB (ref >60)
Glucose, Bld: 117 mg/dL — ABNORMAL HIGH (ref 70–99)
POTASSIUM: 4.1 mmol/L (ref 3.5–5.1)
SODIUM: 138 mmol/L (ref 135–145)

## 2017-10-26 LAB — GLUCOSE, CAPILLARY
GLUCOSE-CAPILLARY: 182 mg/dL — AB (ref 70–99)
Glucose-Capillary: 41 mg/dL — CL (ref 70–99)
Glucose-Capillary: 168 mg/dL — ABNORMAL HIGH (ref 70–99)

## 2017-10-26 LAB — PREPARE RBC (CROSSMATCH)

## 2017-10-26 LAB — POC OCCULT BLOOD, ED: FECAL OCCULT BLD: POSITIVE — AB

## 2017-10-26 MED ORDER — PANTOPRAZOLE SODIUM 40 MG PO TBEC
40.0000 mg | DELAYED_RELEASE_TABLET | Freq: Two times a day (BID) | ORAL | Status: DC
  Administered 2017-10-26 – 2017-10-30 (×9): 40 mg via ORAL
  Filled 2017-10-26 (×11): qty 1

## 2017-10-26 MED ORDER — ACETAMINOPHEN 650 MG RE SUPP: 650.0000 mg | Freq: Four times a day (QID) | RECTAL | Status: DC | PRN

## 2017-10-26 MED ORDER — FUROSEMIDE 10 MG/ML IJ SOLN
40.0000 mg | Freq: Once | INTRAMUSCULAR | Status: AC
  Administered 2017-10-26: 40 mg via INTRAVENOUS

## 2017-10-26 MED ORDER — SODIUM CHLORIDE 0.9% IV SOLUTION: Freq: Once | INTRAVENOUS | Status: DC

## 2017-10-26 MED ORDER — ATORVASTATIN CALCIUM 40 MG PO TABS
40.0000 mg | ORAL_TABLET | Freq: Every day | ORAL | Status: DC
  Administered 2017-10-26 – 2017-10-30 (×5): 40 mg via ORAL
  Filled 2017-10-26 (×5): qty 1

## 2017-10-26 MED ORDER — METOPROLOL TARTRATE 25 MG PO TABS
12.5000 mg | ORAL_TABLET | Freq: Two times a day (BID) | ORAL | Status: DC
  Administered 2017-10-27 – 2017-10-31 (×6): 12.5 mg via ORAL
  Filled 2017-10-26 (×8): qty 1

## 2017-10-26 MED ORDER — INSULIN ASPART 100 UNIT/ML ~~LOC~~ SOLN
0.0000 [IU] | Freq: Three times a day (TID) | SUBCUTANEOUS | Status: DC
  Administered 2017-10-28: 2 [IU] via SUBCUTANEOUS
  Administered 2017-10-28 – 2017-10-30 (×3): 1 [IU] via SUBCUTANEOUS

## 2017-10-26 MED ORDER — POLYETHYLENE GLYCOL 3350 17 G PO PACK: 17.0000 g | PACK | Freq: Every day | ORAL | Status: DC | PRN

## 2017-10-26 MED ORDER — AMLODIPINE BESYLATE 5 MG PO TABS
10.0000 mg | ORAL_TABLET | Freq: Every day | ORAL | Status: DC
Start: 2017-10-27 — End: 2017-10-31
  Administered 2017-10-28 – 2017-10-31 (×3): 10 mg via ORAL
  Filled 2017-10-26 (×5): qty 2

## 2017-10-26 MED ORDER — GABAPENTIN 600 MG PO TABS
300.0000 mg | ORAL_TABLET | Freq: Three times a day (TID) | ORAL | Status: DC
  Filled 2017-10-26 (×9): qty 0.5

## 2017-10-26 MED ORDER — ADULT MULTIVITAMIN W/MINERALS CH
1.0000 | ORAL_TABLET | Freq: Every day | ORAL | Status: DC
  Administered 2017-10-27 – 2017-10-31 (×5): 1 via ORAL
  Filled 2017-10-26 (×5): qty 1

## 2017-10-26 MED ORDER — FUROSEMIDE 10 MG/ML IJ SOLN
20.0000 mg | Freq: Once | INTRAMUSCULAR | Status: AC
  Administered 2017-10-27: 20 mg via INTRAVENOUS
  Filled 2017-10-26: qty 2

## 2017-10-26 MED ORDER — SODIUM BICARBONATE 650 MG PO TABS
650.0000 mg | ORAL_TABLET | Freq: Two times a day (BID) | ORAL | Status: DC
  Administered 2017-10-26 – 2017-10-31 (×11): 650 mg via ORAL
  Filled 2017-10-26 (×10): qty 1

## 2017-10-26 MED ORDER — GABAPENTIN 300 MG PO CAPS
300.0000 mg | ORAL_CAPSULE | Freq: Three times a day (TID) | ORAL | Status: DC
  Administered 2017-10-26 – 2017-10-29 (×11): 300 mg via ORAL
  Filled 2017-10-26 (×4): qty 1
  Filled 2017-10-26: qty 3
  Filled 2017-10-26 (×7): qty 1

## 2017-10-26 MED ORDER — ONDANSETRON HCL 4 MG PO TABS: 4.0000 mg | ORAL_TABLET | Freq: Four times a day (QID) | ORAL | Status: DC | PRN

## 2017-10-26 MED ORDER — MOMETASONE FURO-FORMOTEROL FUM 200-5 MCG/ACT IN AERO
2.0000 | INHALATION_SPRAY | Freq: Two times a day (BID) | RESPIRATORY_TRACT | Status: DC
  Administered 2017-10-26 – 2017-10-31 (×9): 2 via RESPIRATORY_TRACT
  Filled 2017-10-26: qty 8.8

## 2017-10-26 MED ORDER — HYDRALAZINE HCL 25 MG PO TABS
100.0000 mg | ORAL_TABLET | Freq: Three times a day (TID) | ORAL | Status: DC
  Administered 2017-10-26 – 2017-10-31 (×12): 100 mg via ORAL
  Filled 2017-10-26 (×12): qty 4

## 2017-10-26 MED ORDER — ONDANSETRON HCL 4 MG/2ML IJ SOLN: 4.0000 mg | Freq: Four times a day (QID) | INTRAMUSCULAR | Status: DC | PRN

## 2017-10-26 MED ORDER — DOCUSATE SODIUM 100 MG PO CAPS
100.0000 mg | ORAL_CAPSULE | Freq: Two times a day (BID) | ORAL | Status: DC
  Administered 2017-10-27 – 2017-10-30 (×6): 100 mg via ORAL
  Filled 2017-10-26 (×9): qty 1

## 2017-10-26 MED ORDER — TOREMIFENE CITRATE 60 MG PO TABS: 30.0000 mg | ORAL_TABLET | ORAL | Status: DC

## 2017-10-26 MED ORDER — SODIUM CHLORIDE 0.9% IV SOLUTION
Freq: Once | INTRAVENOUS | Status: AC
  Administered 2017-10-26: 21:00:00 via INTRAVENOUS

## 2017-10-26 MED ORDER — DILTIAZEM HCL ER COATED BEADS 180 MG PO CP24
180.0000 mg | ORAL_CAPSULE | Freq: Every day | ORAL | Status: DC
  Administered 2017-10-28 – 2017-10-31 (×3): 180 mg via ORAL
  Filled 2017-10-26 (×8): qty 1

## 2017-10-26 MED ORDER — ACETAMINOPHEN 325 MG PO TABS
650.0000 mg | ORAL_TABLET | Freq: Four times a day (QID) | ORAL | Status: DC | PRN
  Administered 2017-10-28: 650 mg via ORAL
  Filled 2017-10-26: qty 2

## 2017-10-26 MED ORDER — ALBUTEROL SULFATE (2.5 MG/3ML) 0.083% IN NEBU
2.5000 mg | INHALATION_SOLUTION | RESPIRATORY_TRACT | Status: DC | PRN
  Administered 2017-10-26: 2.5 mg via RESPIRATORY_TRACT
  Filled 2017-10-26: qty 3

## 2017-10-26 NOTE — H&P (Signed)
History and Physical  Isaly Fasching VZD:[id number] DOB: 11-09-1940 DOA: 10/26/2017  Referring physician: Sabra Heck MD PCP: Monico Blitz, MD   Chief Complaint: ABNORMAL LAB  HPI: Jody [name] is a 77 y.o. female with end-stage renal disease on hemodialysis Tuesday Thursday and Saturday was apparently sent to the ED by her dialysis center with concerns of worsening anemia.  The patient reports that she was told that she likely would require a blood transfusion as her hemoglobin has declined to 6.6 down from 9 approximately 1 month ago.  The patient is essentially asymptomatic at this time and denies shortness of breath and chest pain.  The patient did have hemodialysis yesterday and tolerated it well.  The patient is chronically anticoagulated with apixaban for atrial fibrillation.  The patient denies blood in the stools but has had what she recalls as "brown colored" stools.  The patient had a positive Hemoccult testing done in the emergency department with black stools noted by ED provider that was Hemoccult positive.  The patient has been typed and crossed and will be transfused 2 units of packed red blood cells.  Given that the patient is actively having a GI bleed she will be admitted for further evaluation and management.  The on-call gastroenterologist has been consulted as well.  The patient will be admitted to telemetry for monitoring and treatment of the anemia.  Anticoagulants will be held temporarily.  Review of Systems: All systems reviewed and apart from history of presenting illness, are negative.  Past Medical History:  Diagnosis Date  . Anemia in chronic kidney disease (CODE) 06/05/2015  . Arthritis    "bad in my legs" (12/07/2014)  . Atrial fibrillation ([place]) 06/05/2015  . Chronic back pain    "dr said my spine is crooked"  . Chronic bronchitis ([place])    "get it q yr" (12/07/2014)  . CKD stage 4 due to type 2 diabetes mellitus ([place]) 06/05/2015  . Complication of anesthesia    headache for 2 days after surgery in May 2019  . COPD (chronic obstructive pulmonary disease) ([place])   . Gait difficulty 09/02/2014  . Heart disease   . Hypercholesterolemia   . Hypertension   . Neuropathy 2015   in legs  . Stroke ([place]) 05/2014   denies residual on 12/07/2014  . Stroke with cerebral ischemia ([place])   . Type II diabetes mellitus ([place])    Past Surgical History:  Procedure Laterality Date  . ABDOMINAL HYSTERECTOMY  ~ 1970  . APPENDECTOMY  ~ 1970  . AV FISTULA PLACEMENT Right 07/06/2017   Procedure: RIGHT RADIOCEPHALIC ARTERIOVENOUS FISTULA creation;  Surgeon: Conrad [place], MD;  Location: [facility];  Service: Vascular;  Laterality: Right;  . BASCILIC VEIN TRANSPOSITION Right 09/17/2017   Procedure: SECOND STAGE BASILIC VEIN TRANSPOSITION RIGHT ARM;  Surgeon: Rosetta Posner, MD;  Location: [facility];  Service: Vascular;  Laterality: Right;  . JOINT REPLACEMENT    . TOTAL KNEE ARTHROPLASTY Right ~ 1986  . TUMOR EXCISION  ~ 1970   "9# in my stomach"   Social History:  reports that she quit smoking about 13 years ago. Her smoking use included cigarettes. She started smoking about 64 years ago. She has a 25.00 pack-year smoking history. She has never used smokeless tobacco. She reports that she does not drink alcohol or use drugs.  Allergies  Allergen Reactions  . Codeine Nausea And Vomiting    Family History  Problem Relation Age of Onset  . Cancer Other   .  Heart attack Brother   . Cancer Sister        breast  . Alcohol abuse Brother     Prior to Admission medications   Medication Sig Start Date End Date Taking? Authorizing Provider  albuterol (PROVENTIL HFA;VENTOLIN HFA) 108 (90 BASE) MCG/ACT inhaler Inhale 1-2 puffs into the lungs every 6 (six) hours as needed for wheezing or shortness of breath. Reported on 03/01/2015   Yes [provider]  amLODipine (NORVASC) 10 MG tablet Take 1 tablet (10 mg total) by mouth daily. 10/18/16  Yes TatShanon Brow, MD  aspirin EC 81 MG  tablet Take 81 mg by mouth daily.   Yes [provider]  atorvastatin (LIPITOR) 40 MG tablet Take 1 tablet by mouth at bedtime.  01/22/16  Yes [provider]  cilostazol (PLETAL) 50 MG tablet Take 50 mg by mouth 2 (two) times daily. 03/01/17  Yes [provider]  diltiazem (DILACOR XR) 180 MG 24 hr capsule Take 180 mg by mouth daily.   Yes [provider]  ELIQUIS 2.5 MG TABS tablet Take 1 tablet (2.5 mg total) by mouth 2 (two) times daily. 10/19/15  Yes Lendon Colonel, NP  fluticasone-salmeterol (ADVAIR HFA) 230-21 MCG/ACT inhaler Inhale 2 puffs into the lungs 2 (two) times daily.   Yes [provider]  gabapentin (NEURONTIN) 600 MG tablet Take 600 mg by mouth 3 (three) times daily.   Yes [provider]  hydrALAZINE (APRESOLINE) 100 MG tablet TAKE ONE (1) TABLET THREE (3) TIMES EACH DAY 07/16/17  Yes Herminio Commons, MD  metoprolol tartrate (LOPRESSOR) 25 MG tablet Take 0.5 tablets (12.5 mg total) by mouth 2 (two) times daily. 10/18/16  Yes Kathie Dike, MD  Multiple Vitamin (MULTIVITAMIN WITH MINERALS) TABS tablet Take 1 tablet by mouth daily.   Yes [provider]  polyethylene glycol (MIRALAX / GLYCOLAX) packet Take 17 g by mouth daily as needed (constipation).   Yes [provider]  propranolol (INDERAL) 10 MG tablet Take 10 mg by mouth 2 (two) times daily.   Yes [provider]  sodium bicarbonate 650 MG tablet Take 1 tablet (650 mg total) by mouth 2 (two) times daily. 10/17/16  Yes Tat, Shanon Brow, MD  TOREMIFENE CITRATE PO Take 40 mg by mouth See admin instructions. Take 40 mg by mouth once daily on non-dialysis Sun, Mon, Wed, and Fri.    [provider]   Physical Exam: Vitals:   10/26/17 1121 10/26/17 1230 10/26/17 1354  Pulse:  (!) 52 (!) 53  Resp:  14 (!) 21  SpO2:  100% 100%  Weight: 96.6 kg    Height: 5\' 7"  (1.702 m)       General exam: Elderly, chronically ill-appearing female,  moderately built and nourished patient, lying comfortably supine on the gurney in no obvious distress.  Head, eyes and ENT: Nontraumatic and normocephalic. Pupils equally reacting to light and accommodation. Oral mucosa pale.  Neck: Supple. No JVD, carotid bruit or thyromegaly.  Lymphatics: No lymphadenopathy.  Respiratory system: Clear to auscultation. No increased work of breathing.  Cardiovascular system: Normal S1 and S2 heard.  Bradycardic.  No JVD, murmurs, gallops, clicks or pedal edema.  Dialysis catheter in the right upper chest wall.  Gastrointestinal system: Abdomen is nondistended, soft and nontender. Normal bowel sounds heard. No organomegaly or masses appreciated.  Central nervous system: Alert and oriented. No focal neurological deficits.  Extremities: Right upper extremity AV fistula with good thrill.  Symmetric 5 x 5 power.  Peripheral pulses symmetrically felt. Swollen right ankle.   Skin: No rashes or acute findings.  Musculoskeletal system: Negative exam.  Psychiatry: Pleasant and cooperative.  Labs on Admission:  Basic Metabolic Panel: Recent Labs  Lab 10/26/17 1200  NA 138  K 4.1  CL 99  CO2 29  GLUCOSE 117*  BUN 28*  CREATININE 3.73*  CALCIUM 8.5*   Liver Function Tests: No results for input(s): AST, ALT, ALKPHOS, BILITOT, PROT, ALBUMIN in the last 168 hours. No results for input(s): LIPASE, AMYLASE in the last 168 hours. No results for input(s): AMMONIA in the last 168 hours. CBC: Recent Labs  Lab 10/26/17 1200  WBC 5.0  NEUTROABS 3.4  HGB 6.6*  HCT 22.2*  MCV 104.2*  PLT 204   Cardiac Enzymes: No results for input(s): CKTOTAL, CKMB, CKMBINDEX, TROPONINI in the last 168 hours.  BNP (last 3 results) No results for input(s): PROBNP in the last 8760 hours. CBG: No results for input(s): GLUCAP in the last 168 hours.  Radiological Exams on Admission: No results found.  Assessment/Plan Active Problems:   Chronic blood loss anemia    Essential hypertension   Type 2 diabetes mellitus with other circulatory complications (HCC)   Gait difficulty   COPD (chronic obstructive pulmonary disease) (HCC)   Anemia in chronic kidney disease, Stage IV   Atrial fibrillation (HCC)   Elevated LFTs   Chronic diastolic CHF (congestive heart failure) (HCC)   Type 2 diabetes mellitus with nephropathy ([place])   ESRD on dialysis ([place])   Arthritis   Acute on chronic blood loss anemia  1. Acute on chronic blood loss anemia-patient is Hemoccult positive with black stools today.  Her hemoglobin is down 3 g from approximately 1 month ago.  The patient's anticoagulants will be held at this time until further work-up.  GI consultation pending.  The patient is scheduled for transfusion of 2 units PRBCs. 2. End-stage renal disease on hemodialysis-we will consult the nephrology team for inpatient hemodialysis treatments.  The patient reports that she has hemodialysis on Tuesday, Thursday and Saturday.  She will be needing to have hemodialysis tomorrow. 3. Upper GI bleed - protonix ordered BID and GI consult pending.   4. Chronic atrial fibrillation- resume home rate limiting medications however holding parameters given for metoprolol given current bradycardia.  Eliquis is being held at this time due to active GI bleeding. 5. Hypotension- likely secondary to blood pressure medications.  Follow closely. 6. Chronic diastolic congestive heart failure-appears well compensated at this time will follow clinically.  Resume home medications. 7. Type 2 diabetes mellitus with nephropathy- we will monitor blood glucose levels and provide supplemental sliding scale insulin coverage. 8. COPD-stable follow clinically. 9. Essential hypertension- resume home blood pressure medications with holding parameters as noted above. 10. Right ankle sprain - Pt still unable to bear weight on right ankle after injury 2 weeks ago. Will check xray right ankle to rule out fracture.     DVT Prophylaxis: TED hose Code Status: Full Family Communication: Patient at bedside Disposition Plan: Pending further evaluation and management  Severity of Illness: The appropriate patient status for this patient is INPATIENT. Inpatient status is judged to be reasonable and necessary in order to provide the required intensity of service to ensure the patient's safety. The patient's presenting symptoms, physical exam findings, and initial radiographic and laboratory data in the context of their chronic comorbidities is felt to place them at high risk for further clinical deterioration. Furthermore, it is not anticipated that  the patient will be medically stable for discharge from the hospital within 2 midnights of admission. The following factors support the patient status of inpatient.   " The patient's presenting symptoms include anemia Hg 6.6. " The worrisome physical exam findings include swollen right ankle. " The initial radiographic and laboratory data are worrisome because of Hg 6.6. " The chronic co-morbidities include ESRD on HD.  * I certify that at the point of admission it is my clinical judgment that the patient will require inpatient hospital care spanning beyond 2 midnights from the point of admission due to high intensity of service, high risk for further deterioration and high frequency of surveillance required.*  Time spent: 57 minutes  Irwin Brakeman, MD [facility] Pager [phone]  If 7PM-7AM, please contact night-coverage [email or web address] Password [email or web address]  10/26/2017, 1:56 PM

## 2017-10-26 NOTE — ED Provider Notes (Signed)
[facility] EMERGENCY DEPARTMENT Provider Note   CSN: [id number] Arrival date & time: 10/26/17  1103     History   Chief Complaint Chief Complaint  Patient presents with  . Abnormal Lab    HPI Jody [name] is a 77 y.o. female.  HPI  77 y/o female - hx of anemia of chronic disease (ESRD), T,Th,S, dialysis - COPD and DM, htn and afib.  Not on anticoagulants per pt's report - but EMR shows eliquis.  Comes in for low Hgb today as seen at dialysis - she is asymptomatic including no HA, no dizziness, no sob more than usual.  She was called today to let her know about her anemia.  Review of the medical record shows that her hemoglobin on August 5 of 2019 was 9.2.  She denies bleeding from nose mouth urine or rectum, denies any black stools, she is still taking Eliquis.  The dialysis center called her this morning and told her to come for a blood transfusion because her blood levels were so low.  Hemoglobin  Date Value Ref Range Status  10/26/2017 6.6 (LL) 12.0 - 15.0 g/dL Final    Comment:    REPEATED TO VERIFY CRITICAL RESULT CALLED TO, READ BACK BY AND VERIFIED WITH: LASHLEY @ 1229 ON [id number] BY HENDERSON L.   09/17/2017 9.2 (L) 12.0 - 15.0 g/dL Final  09/03/2017 9.2 (L) 12.0 - 15.0 g/dL Final  07/06/2017 8.8 (L) 12.0 - 15.0 g/dL Final     Past Medical History:  Diagnosis Date  . Anemia in chronic kidney disease (CODE) 06/05/2015  . Arthritis    "bad in my legs" (12/07/2014)  . Atrial fibrillation ([place]) 06/05/2015  . Chronic back pain    "dr said my spine is crooked"  . Chronic bronchitis ([place])    "get it q yr" (12/07/2014)  . CKD stage 4 due to type 2 diabetes mellitus ([place]) 06/05/2015  . Complication of anesthesia    headache for 2 days after surgery in May 2019  . COPD (chronic obstructive pulmonary disease) ([place])   . Gait difficulty 09/02/2014  . Heart disease   . Hypercholesterolemia   . Hypertension   . Neuropathy 2015   in legs  . Stroke ([place]) 05/2014     denies residual on 12/07/2014  . Stroke with cerebral ischemia ([place])   . Type II diabetes mellitus [facility])     Patient Active Problem List   Diagnosis Date Noted  . Chronic blood loss anemia 10/26/2017  . Iron deficiency anemia 02/23/2017  . Chronic diastolic CHF (congestive heart failure) ([place]) 10/15/2016  . Elevated troponin 10/15/2016  . Type 2 diabetes mellitus with nephropathy ([place]) 10/15/2016  . ESRD on dialysis ([place]) 10/15/2016  . NSTEMI (non-ST elevated myocardial infarction) ([place]) 10/03/2015  . Elevated LFTs 10/03/2015  . Encounter for therapeutic drug monitoring 07/29/2015  . COPD with acute exacerbation ([place])   . COPD (chronic obstructive pulmonary disease) ([place]) 06/05/2015  . CKD stage 4 due to type 2 diabetes mellitus ([place]) 06/05/2015  . Anemia in chronic kidney disease, Stage IV 06/05/2015  . Acute on chronic diastolic CHF (congestive heart failure) ([place]) 06/05/2015  . Atrial fibrillation ([place]) 06/05/2015  . Atrial fibrillation with rapid ventricular response ([place]) 12/07/2014  . TIA (transient ischemic attack) 09/02/2014  . Gait difficulty 09/02/2014  . Other specified transient cerebral ischemias   . Stroke with cerebral ischemia ([place])   . Hemispheric carotid artery syndrome   . Essential hypertension   . Hyperlipidemia   .  Type 2 diabetes mellitus with other circulatory complications ([employer])   . CVA (cerebral infarction) 05/27/2014    Past Surgical History:  Procedure Laterality Date  . ABDOMINAL HYSTERECTOMY  ~ 1970  . APPENDECTOMY  ~ 1970  . AV FISTULA PLACEMENT Right 07/06/2017   Procedure: RIGHT RADIOCEPHALIC ARTERIOVENOUS FISTULA creation;  Surgeon: Conrad Hale, MD;  Location: [facility];  Service: Vascular;  Laterality: Right;  . BASCILIC VEIN TRANSPOSITION Right 09/17/2017   Procedure: SECOND STAGE BASILIC VEIN TRANSPOSITION RIGHT ARM;  Surgeon: Rosetta Posner, MD;  Location: [facility];  Service: Vascular;  Laterality: Right;  . JOINT REPLACEMENT    . TOTAL KNEE  ARTHROPLASTY Right ~ 1986  . TUMOR EXCISION  ~ 1970   "9# in my stomach"     OB History   None      Home Medications    Prior to Admission medications   Medication Sig Start Date End Date Taking? Authorizing Provider  albuterol (PROVENTIL HFA;VENTOLIN HFA) 108 (90 BASE) MCG/ACT inhaler Inhale 1-2 puffs into the lungs every 6 (six) hours as needed for wheezing or shortness of breath. Reported on 03/01/2015   Yes [provider]  amLODipine (NORVASC) 10 MG tablet Take 1 tablet (10 mg total) by mouth daily. 10/18/16  Yes TatShanon Brow, MD  aspirin EC 81 MG tablet Take 81 mg by mouth daily.   Yes [provider]  atorvastatin (LIPITOR) 40 MG tablet Take 1 tablet by mouth at bedtime.  01/22/16  Yes [provider]  cilostazol (PLETAL) 50 MG tablet Take 50 mg by mouth 2 (two) times daily. 03/01/17  Yes [provider]  diltiazem (DILACOR XR) 180 MG 24 hr capsule Take 180 mg by mouth daily.   Yes [provider]  ELIQUIS 2.5 MG TABS tablet Take 1 tablet (2.5 mg total) by mouth 2 (two) times daily. 10/19/15  Yes Lendon Colonel, NP  fluticasone-salmeterol (ADVAIR HFA) 230-21 MCG/ACT inhaler Inhale 2 puffs into the lungs 2 (two) times daily.   Yes [provider]  gabapentin (NEURONTIN) 600 MG tablet Take 600 mg by mouth 3 (three) times daily.   Yes [provider]  hydrALAZINE (APRESOLINE) 100 MG tablet TAKE ONE (1) TABLET THREE (3) TIMES EACH DAY 07/16/17  Yes Herminio Commons, MD  metoprolol tartrate (LOPRESSOR) 25 MG tablet Take 0.5 tablets (12.5 mg total) by mouth 2 (two) times daily. 10/18/16  Yes Kathie Dike, MD  Multiple Vitamin (MULTIVITAMIN WITH MINERALS) TABS tablet Take 1 tablet by mouth daily.   Yes [provider]  polyethylene glycol (MIRALAX / GLYCOLAX) packet Take 17 g by mouth daily as needed (constipation).   Yes [provider]  propranolol (INDERAL) 10 MG tablet Take 10 mg by mouth 2 (two) times  daily.   Yes [provider]  sodium bicarbonate 650 MG tablet Take 1 tablet (650 mg total) by mouth 2 (two) times daily. 10/17/16  Yes Tat, Shanon Brow, MD  TOREMIFENE CITRATE PO Take 40 mg by mouth See admin instructions. Take 40 mg by mouth once daily on non-dialysis Sun, Mon, Wed, and Fri.    [provider]    Family History Family History  Problem Relation Age of Onset  . Cancer Other   . Heart attack Brother   . Cancer Sister        breast  . Alcohol abuse Brother     Social History Social History   Tobacco Use  . Smoking status: Former Smoker  Packs/day: 0.50    Years: 50.00    Pack years: 25.00    Types: Cigarettes    Start date: 03/07/1953    Last attempt to quit: 01/14/2004    Years since quitting: 13.7  . Smokeless tobacco: Never Used  . Tobacco comment: "quit smoking cigarettes  in ~ 2005"  Substance Use Topics  . Alcohol use: No    Alcohol/week: 0.0 standard drinks    Comment: 10/242016 "quit drinking beer in ~ 1977"  . Drug use: No     Allergies   Codeine   Review of Systems Review of Systems  All other systems reviewed and are negative.    Physical Exam Updated Vital Signs Pulse (!) 52   Resp 14   Ht 1.702 m (5\' 7" )   Wt 96.6 kg   SpO2 100%   BMI 33.36 kg/m   Physical Exam  Constitutional: She appears well-developed and well-nourished. No distress.  HENT:  Head: Normocephalic and atraumatic.  Mouth/Throat: Oropharynx is clear and moist. No oropharyngeal exudate.  Eyes: Pupils are equal, round, and reactive to light. Conjunctivae and EOM are normal. Right eye exhibits no discharge. Left eye exhibits no discharge. No scleral icterus.  Neck: Normal range of motion. Neck supple. No JVD present. No thyromegaly present.  Cardiovascular: Regular rhythm, normal heart sounds and intact distal pulses. Exam reveals no gallop and no friction rub.  No murmur heard. Fistula - good thrill - RUE.  Right upper chest wall, dialysis catheter  placed, appears clean, no discharge or surrounding redness  Mild bradycardia to 50 bpm with normal pulses, no murmurs  Pulmonary/Chest: Effort normal and breath sounds normal. No respiratory distress. She has no wheezes. She has no rales.  Abdominal: Soft. Bowel sounds are normal. She exhibits no distension and no mass. There is no tenderness.  Musculoskeletal: Normal range of motion. She exhibits no edema or tenderness.  Lymphadenopathy:    She has no cervical adenopathy.  Neurological: She is alert. Coordination normal.  Skin: Skin is warm and dry. No rash noted. No erythema.  Psychiatric: She has a normal mood and affect. Her behavior is normal.  Nursing note and vitals reviewed.    ED Treatments / Results  Labs (all labs ordered are listed, but only abnormal results are displayed) Labs Reviewed  CBC WITH DIFFERENTIAL/PLATELET - Abnormal; Notable for the following components:      Result Value   RBC 2.13 (*)    Hemoglobin 6.6 (*)    HCT 22.2 (*)    MCV 104.2 (*)    MCHC 29.7 (*)    RDW 17.7 (*)    All other components within normal limits  BASIC METABOLIC PANEL - Abnormal; Notable for the following components:   Glucose, Bld 117 (*)    BUN 28 (*)    Creatinine, Ser 3.73 (*)    Calcium 8.5 (*)    GFR calc non Af Amer 11 (*)    GFR calc Af Amer 12 (*)    All other components within normal limits  POC OCCULT BLOOD, ED - Abnormal; Notable for the following components:   Fecal Occult Bld POSITIVE (*)    All other components within normal limits  TYPE AND SCREEN  PREPARE RBC (CROSSMATCH)    EKG None  Radiology No results found.  Procedures .Critical Care Performed by: Noemi Chapel, MD Authorized by: Noemi Chapel, MD   Critical care provider statement:    Critical care time (minutes):  35   Critical care  time was exclusive of:  Separately billable procedures and treating other patients and teaching time   Critical care was necessary to treat or prevent imminent  or life-threatening deterioration of the following conditions: GI bleed - severe anemia.   Critical care was time spent personally by me on the following activities:  Blood draw for specimens, development of treatment plan with patient or surrogate, discussions with consultants, evaluation of patient's response to treatment, examination of patient, obtaining history from patient or surrogate, ordering and performing treatments and interventions, ordering and review of laboratory studies, ordering and review of radiographic studies, pulse oximetry, re-evaluation of patient's condition and review of old charts   (including critical care time)  Medications Ordered in ED Medications  0.9 %  sodium chloride infusion (Manually program via Guardrails IV Fluids) (has no administration in time range)     Initial Impression / Assessment and Plan / ED Course  I have reviewed the triage vital signs and the nursing notes.  Pertinent labs & imaging results that were available during my care of the patient were reviewed by me and considered in my medical decision making (see chart for details).     Recheck hemoglobin, may need rectal exam if hemoglobin is in fact low, the patient is in no distress, blood pressure is just under 263 systolic, she is not tachycardic.  Hemoccult-positive based on physician assistant students exam with chaperone present, dark-colored brown stool.  Labs reviewed and compared to prior, she is approximately 3 g lower than she was 1 month ago.  She will be given 2 units of blood, her blood pressure has slightly improved, she will be admitted to the hospital service, gastroenterology paged for consultation as well as she does have some gastrointestinal bleeding.  Final Clinical Impressions(s) / ED Diagnoses   Final diagnoses:  Severe anemia  Gastrointestinal hemorrhage, unspecified gastrointestinal hemorrhage type    ED Discharge Orders    None       Noemi Chapel,  MD 10/26/17 1345

## 2017-10-26 NOTE — ED Notes (Signed)
Date and time results received: 10/26/17 .now3  Test: HGB Critical Value: 6.6  Name of Provider Notified: Hazle Nordmann  Orders Received? Or Actions Taken?: no new orders at this time

## 2017-10-26 NOTE — Progress Notes (Signed)
Accidentally Completed the 2nd unit of blood while trying to document the 1st unit was completed. Per lab, I inserted a transfuse unit order so we can document correctly on the 2nd unit.

## 2017-10-26 NOTE — ED Triage Notes (Signed)
Patient states she was sent from dialysis for low HGB. Patient has no complaints at this time.

## 2017-10-27 DIAGNOSIS — D500 Iron deficiency anemia secondary to blood loss (chronic): Secondary | ICD-10-CM

## 2017-10-27 LAB — MAGNESIUM: Magnesium: 2.7 mg/dL — ABNORMAL HIGH (ref 1.7–2.4)

## 2017-10-27 LAB — HEPATITIS B SURFACE ANTIGEN: Hepatitis B Surface Ag: NEGATIVE

## 2017-10-27 LAB — GLUCOSE, CAPILLARY
GLUCOSE-CAPILLARY: 111 mg/dL — AB (ref 70–99)
Glucose-Capillary: 111 mg/dL — ABNORMAL HIGH (ref 70–99)
Glucose-Capillary: 119 mg/dL — ABNORMAL HIGH (ref 70–99)
Glucose-Capillary: 133 mg/dL — ABNORMAL HIGH (ref 70–99)

## 2017-10-27 LAB — COMPREHENSIVE METABOLIC PANEL
ALK PHOS: 79 U/L (ref 38–126)
ALT: 18 U/L (ref 0–44)
AST: 24 U/L (ref 15–41)
Albumin: 3.1 g/dL — ABNORMAL LOW (ref 3.5–5.0)
Anion gap: 10 (ref 5–15)
BILIRUBIN TOTAL: 1.1 mg/dL (ref 0.3–1.2)
BUN: 36 mg/dL — AB (ref 8–23)
CALCIUM: 8.8 mg/dL — AB (ref 8.9–10.3)
CO2: 28 mmol/L (ref 22–32)
CREATININE: 4.27 mg/dL — AB (ref 0.44–1.00)
Chloride: 100 mmol/L (ref 98–111)
GFR calc Af Amer: 10 mL/min — ABNORMAL LOW (ref >60)
GFR calc non Af Amer: 9 mL/min — ABNORMAL LOW (ref >60)
GLUCOSE: 115 mg/dL — AB (ref 70–99)
POTASSIUM: 4.6 mmol/L (ref 3.5–5.1)
Sodium: 138 mmol/L (ref 135–145)
TOTAL PROTEIN: 6.3 g/dL — AB (ref 6.5–8.1)

## 2017-10-27 LAB — CBC
HEMATOCRIT: 27.4 % — AB (ref 36.0–46.0)
Hemoglobin: 8.3 g/dL — ABNORMAL LOW (ref 12.0–15.0)
MCH: 30.4 pg (ref 26.0–34.0)
MCHC: 30.3 g/dL (ref 30.0–36.0)
MCV: 100.4 fL — ABNORMAL HIGH (ref 78.0–100.0)
PLATELETS: 173 10*3/uL (ref 150–400)
RBC: 2.73 MIL/uL — ABNORMAL LOW (ref 3.87–5.11)
RDW: 19.9 % — AB (ref 11.5–15.5)
WBC: 5.4 10*3/uL (ref 4.0–10.5)

## 2017-10-27 MED ORDER — EPOETIN ALFA 10000 UNIT/ML IJ SOLN
6000.0000 [IU] | INTRAMUSCULAR | Status: DC
  Administered 2017-10-27 – 2017-10-30 (×2): 6000 [IU] via SUBCUTANEOUS
  Filled 2017-10-27 (×3): qty 1

## 2017-10-27 MED ORDER — CHLORHEXIDINE GLUCONATE CLOTH 2 % EX PADS
6.0000 | MEDICATED_PAD | Freq: Every day | CUTANEOUS | Status: DC
  Administered 2017-10-27 – 2017-10-30 (×3): 6 via TOPICAL

## 2017-10-27 MED ORDER — PEG 3350-KCL-NA BICARB-NACL 420 G PO SOLR
4000.0000 mL | Freq: Once | ORAL | Status: AC
  Administered 2017-10-27: 4000 mL via ORAL
  Filled 2017-10-27: qty 4000

## 2017-10-27 MED ORDER — SODIUM CHLORIDE 0.9 % IV SOLN
100.0000 mL | INTRAVENOUS | Status: DC | PRN
  Administered 2017-10-31: 11:00:00 via INTRAVENOUS

## 2017-10-27 MED ORDER — HEPARIN SODIUM (PORCINE) 1000 UNIT/ML IJ SOLN
INTRAMUSCULAR | Status: AC
  Administered 2017-10-27: 3800 [IU] via INTRAVENOUS_CENTRAL
  Filled 2017-10-27: qty 1

## 2017-10-27 MED ORDER — EPOETIN ALFA 10000 UNIT/ML IJ SOLN
INTRAMUSCULAR | Status: AC
  Administered 2017-10-27: 6000 [IU] via SUBCUTANEOUS
  Filled 2017-10-27: qty 1

## 2017-10-27 MED ORDER — HEPARIN SODIUM (PORCINE) 1000 UNIT/ML DIALYSIS
1000.0000 [IU] | INTRAMUSCULAR | Status: DC | PRN
  Administered 2017-10-27 – 2017-10-30 (×2): 3800 [IU] via INTRAVENOUS_CENTRAL
  Filled 2017-10-27 (×3): qty 1

## 2017-10-27 MED ORDER — FUROSEMIDE 10 MG/ML IJ SOLN
INTRAMUSCULAR | Status: AC
  Filled 2017-10-27: qty 2

## 2017-10-27 MED ORDER — SODIUM CHLORIDE 0.9 % IV SOLN: 100.0000 mL | INTRAVENOUS | Status: DC | PRN

## 2017-10-27 MED ORDER — ALTEPLASE 2 MG IJ SOLR
2.0000 mg | Freq: Once | INTRAMUSCULAR | Status: DC | PRN
  Filled 2017-10-27: qty 2

## 2017-10-27 NOTE — Procedures (Signed)
      HEMODIALYSIS TREATMENT NOTE:   4 hour heparin-free dialysis ordered.  Pt signed off one hour early/AMA.  "I only run 3 hours."  Goal met: 2 liters removed without interruption in ultrafiltration.  All blood was returned.  Hemodynamically stable throughout HD session.   Rockwell Alexandria, RN, CDN
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
## 2017-10-28 ENCOUNTER — Encounter (HOSPITAL_COMMUNITY)
Admission: EM | Disposition: A | Discharge status: Home Health | Payer: Self-pay | Source: Home / Self Care | Attending: Family Medicine

## 2017-10-28 ENCOUNTER — Other Ambulatory Visit: Payer: Self-pay

## 2017-10-28 DIAGNOSIS — K644 Residual hemorrhoidal skin tags: Secondary | ICD-10-CM

## 2017-10-28 DIAGNOSIS — K5730 Diverticulosis of large intestine without perforation or abscess without bleeding: Secondary | ICD-10-CM

## 2017-10-28 DIAGNOSIS — Z5309 Procedure and treatment not carried out because of other contraindication: Secondary | ICD-10-CM

## 2017-10-28 HISTORY — PX: COLONOSCOPY: SHX5424

## 2017-10-28 LAB — RENAL FUNCTION PANEL
ALBUMIN: 3.1 g/dL — AB (ref 3.5–5.0)
Anion gap: 8 (ref 5–15)
BUN: 15 mg/dL (ref 8–23)
CALCIUM: 8.4 mg/dL — AB (ref 8.9–10.3)
CHLORIDE: 98 mmol/L (ref 98–111)
CO2: 29 mmol/L (ref 22–32)
CREATININE: 2.43 mg/dL — AB (ref 0.44–1.00)
GFR calc Af Amer: 21 mL/min — ABNORMAL LOW (ref >60)
GFR calc non Af Amer: 18 mL/min — ABNORMAL LOW (ref >60)
Glucose, Bld: 104 mg/dL — ABNORMAL HIGH (ref 70–99)
PHOSPHORUS: 3.4 mg/dL (ref 2.5–4.6)
POTASSIUM: 3.7 mmol/L (ref 3.5–5.1)
Sodium: 135 mmol/L (ref 135–145)

## 2017-10-28 LAB — CBC
HCT: 26.5 % — ABNORMAL LOW (ref 36.0–46.0)
Hemoglobin: 7.8 g/dL — ABNORMAL LOW (ref 12.0–15.0)
MCH: 29.9 pg (ref 26.0–34.0)
MCHC: 29.4 g/dL — ABNORMAL LOW (ref 30.0–36.0)
MCV: 101.5 fL — AB (ref 78.0–100.0)
PLATELETS: 179 10*3/uL (ref 150–400)
RBC: 2.61 MIL/uL — AB (ref 3.87–5.11)
RDW: 18.2 % — ABNORMAL HIGH (ref 11.5–15.5)
WBC: 6.3 10*3/uL (ref 4.0–10.5)

## 2017-10-28 LAB — GLUCOSE, CAPILLARY
GLUCOSE-CAPILLARY: 147 mg/dL — AB (ref 70–99)
Glucose-Capillary: 197 mg/dL — ABNORMAL HIGH (ref 70–99)
Glucose-Capillary: 113 mg/dL — ABNORMAL HIGH (ref 70–99)

## 2017-10-28 LAB — PREPARE RBC (CROSSMATCH)

## 2017-10-28 SURGERY — COLONOSCOPY
Anesthesia: Moderate Sedation

## 2017-10-28 MED ORDER — MEPERIDINE HCL 50 MG/ML IJ SOLN
INTRAMUSCULAR | Status: AC
  Filled 2017-10-28: qty 1

## 2017-10-28 MED ORDER — LIDOCAINE VISCOUS HCL 2 % MT SOLN
OROMUCOSAL | Status: DC | PRN
  Administered 2017-10-28: 1 via OROMUCOSAL

## 2017-10-28 MED ORDER — SODIUM CHLORIDE 0.9% IV SOLUTION
Freq: Once | INTRAVENOUS | Status: AC
  Administered 2017-10-28: 13:00:00 via INTRAVENOUS

## 2017-10-28 MED ORDER — FENTANYL CITRATE (PF) 100 MCG/2ML IJ SOLN
INTRAMUSCULAR | Status: DC | PRN
  Administered 2017-10-28 (×2): 25 ug via INTRAVENOUS

## 2017-10-28 MED ORDER — MIDAZOLAM HCL 5 MG/5ML IJ SOLN
INTRAMUSCULAR | Status: DC | PRN
  Administered 2017-10-28: 1 mg via INTRAVENOUS

## 2017-10-28 MED ORDER — FUROSEMIDE 10 MG/ML IJ SOLN
60.0000 mg | Freq: Once | INTRAMUSCULAR | Status: AC
  Administered 2017-10-28: 60 mg via INTRAVENOUS
  Filled 2017-10-28: qty 6

## 2017-10-28 MED ORDER — BISACODYL 5 MG PO TBEC
10.0000 mg | DELAYED_RELEASE_TABLET | ORAL | Status: AC
  Administered 2017-10-28: 10 mg via ORAL
  Filled 2017-10-28: qty 2

## 2017-10-28 MED ORDER — MIDAZOLAM HCL 5 MG/5ML IJ SOLN
INTRAMUSCULAR | Status: AC
  Filled 2017-10-28: qty 10

## 2017-10-28 MED ORDER — BISACODYL 5 MG PO TBEC: 5.0000 mg | DELAYED_RELEASE_TABLET | Freq: Every day | ORAL | Status: DC | PRN

## 2017-10-28 MED ORDER — FENTANYL CITRATE (PF) 100 MCG/2ML IJ SOLN
INTRAMUSCULAR | Status: AC
  Filled 2017-10-28: qty 2

## 2017-10-28 MED ORDER — LIDOCAINE VISCOUS HCL 2 % MT SOLN
OROMUCOSAL | Status: AC
  Filled 2017-10-28: qty 15

## 2017-10-28 NOTE — Op Note (Signed)
[facility] Patient [name] Procedure Date: 10/28/2017 6:51 AM MRN: [id number] Date of Birth: 1941-02-10 Attending MD: Hildred Laser , MD CSN: [id number] Age: 77 Admit Type: Inpatient Procedure:                Colonoscopy Indications:              Iron deficiency anemia secondary to chronic blood                            loss Providers:                Hildred Laser, MD, Janeece Riggers, RN, Aram Candela Referring MD:             Irwin Brakeman, MD Medicines:                Fentanyl 25 micrograms IV Complications:            No immediate complications. Estimated Blood Loss:     Estimated blood loss: none. Procedure:                Pre-Anesthesia Assessment:                           - Prior to the procedure, a History and Physical                            was performed, and patient medications and                            allergies were reviewed. The patient's tolerance of                            previous anesthesia was also reviewed. The risks                            and benefits of the procedure and the sedation                            options and risks were discussed with the patient.                            All questions were answered, and informed consent                            was obtained. ASA Grade Assessment: III - A patient                            with severe systemic disease. After reviewing the                            risks and benefits, the patient was deemed in                            satisfactory condition to undergo the procedure.  After obtaining informed consent, the colonoscope                            was passed under direct vision. Throughout the                            procedure, the patient's blood pressure, pulse, and                            oxygen saturations were monitored continuously. The                            [id number]-[id number] ([id number]) scope was introduced through               the anus and advanced to the the cecum, identified                            by appendiceal orifice and ileocecal valve. The                            colonoscopy was somewhat difficult due to a                            tortuous colon. The patient tolerated the procedure                            well. The quality of the bowel preparation was                            adequate. The ileocecal valve, appendiceal orifice,                            and rectum were photographed. The ileocecal valve,                            appendiceal orifice, and rectum were photographed. Scope In: 7:54:08 AM Scope Out: 8:18:57 AM Scope Withdrawal Time: 0 hours 8 minutes 52 seconds  Total Procedure Duration: 0 hours 24 minutes 49 seconds  Findings:      The perianal and digital rectal examinations were normal.      Multiple medium-mouthed diverticula were found in the ascending colon       and cecum.      Small amount of burgandy and small clot was found at the hepatic       flexure. No bleeding lesion identified.      The exam was otherwise normal throughout the examined colon.      External hemorrhoids were found during retroflexion. The hemorrhoids       were small. Impression:               - Diverticulosis in the ascending colon and in the                            cecum.                           -  Blood and small clot at the hepatic flexure. No                            bleeding lesion identified.                           - External hemorrhoids.                           - No specimens collected.                           Comment: ? Diverticular bleed. Moderate Sedation:      Moderate (conscious) sedation was administered by the endoscopy nurse       and supervised by the endoscopist. The following parameters were       monitored: oxygen saturation, heart rate, blood pressure, CO2       capnography and response to care. Total physician intraservice time was       32  minutes. Recommendation:           - Return patient to hospital ward for ongoing care.                           - Diabetic (ADA) and renal diet today.                           - Continue present medications.                           - See the other procedure note for documentation of                            additional recommendations. Procedure Code(s):        --- Professional ---                           [phone], Colonoscopy, flexible; diagnostic, including                            collection of specimen(s) by brushing or washing,                            when performed (separate procedure)                           G0500, Moderate sedation services provided by the                            same physician or other qualified health care                            professional performing a gastrointestinal                            endoscopic service that sedation supports,  requiring the presence of an independent trained                            observer to assist in the monitoring of the                            patient's level of consciousness and physiological                            status; initial 15 minutes of intra-service time;                            patient age 70 years or older (additional time may                            be reported with [phone], as appropriate)                           [phone], Moderate sedation services provided by the                            same physician or other qualified health care                            professional performing the diagnostic or                            therapeutic service that the sedation supports,                            requiring the presence of an independent trained                            observer to assist in the monitoring of the                            patient's level of consciousness and physiological                            status; each additional 15 minutes  intraservice                            time (List separately in addition to code for                            primary service) Diagnosis Code(s):        --- Professional ---                           K64.4, Residual hemorrhoidal skin tags                           K92.2, Gastrointestinal hemorrhage, unspecified  D50.0, Iron deficiency anemia secondary to blood                            loss (chronic)                           K57.30, Diverticulosis of large intestine without                            perforation or abscess without bleeding CPT copyright 2017 [employer]. All rights reserved. The codes documented in this report are preliminary and upon coder review may  be revised to meet current compliance requirements. Hildred Laser, MD Hildred Laser, MD 10/28/2017 8:47:52 AM This report has been signed electronically. Number of Addenda: 0
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
## 2017-10-28 NOTE — Progress Notes (Signed)
Brief EGD and colonoscopy note.   Incomplete EGD as unable to advance scope across the hypopharynx into the esophagus. Small nodule/polyp noted anterior confluence of the vocal cords. Normal hypopharynx.  Colonoscopy completed to cecum. Prep adequate. Supple diverticulitis cecum and ascending colon without mild bleed. Small amount of burgundy blood and free-floating clot noted at hepatic flexure. Small external hemorrhoids.

## 2017-10-28 NOTE — Progress Notes (Signed)
  Patient states she has small throats.  She had no difficulty eating at lunch. Patient informed that EGD could not be performed. Colonoscopy findings reviewed with patient. We will proceed with esophagogram and upper GI series tomorrow.

## 2017-10-28 NOTE — Op Note (Signed)
[facility] Patient [name] Procedure Date: 10/28/2017 6:52 AM MRN: [id number] Date of Birth: 08-01-1940 Attending MD: Hildred Laser , MD CSN: [id number] Age: 77 Admit Type: Outpatient Procedure:                Upper GI endoscopy Indications:              Iron deficiency anemia secondary to chronic blood                            loss Providers:                Hildred Laser, MD, Janeece Riggers, RN, Aram Candela Referring MD:             Irwin Brakeman, MD Medicines:                Lidocaine spray, Fentanyl 25 micrograms IV,                            Midazolam 1 mg IV Complications:            No immediate complications. Estimated Blood Loss:     Estimated blood loss: none. Estimated blood loss:                            none. Procedure:                Pre-Anesthesia Assessment:                           - Prior to the procedure, a History and Physical                            was performed, and patient medications and                            allergies were reviewed. The patient's tolerance of                            previous anesthesia was also reviewed. The risks                            and benefits of the procedure and the sedation                            options and risks were discussed with the patient.                            All questions were answered, and informed consent                            was obtained. Prior Anticoagulants: The patient                            last took aspirin 2 days, Eliquis (apixaban) 2 days  and Pletal (cilostazol) 2 days prior to the                            procedure. ASA Grade Assessment: III - A patient                            with severe systemic disease. After reviewing the                            risks and benefits, the patient was deemed in                            satisfactory condition to undergo the procedure.                           After obtaining informed  consent, the endoscope was                            passed under direct vision. Throughout the                            procedure, the patient's blood pressure, pulse, and                            oxygen saturations were monitored continuously. The                            procedure was aborted. The scope was not inserted.                            Medications were given. The upper GI endoscopy was                            performed with difficulty due to abnormal anatomy.                            The patient tolerated the procedure fairly well. Scope In: 7:37:19 AM Scope Out: 7:51:15 AM Total Procedure Duration: 0 hours 13 minutes 56 seconds  Findings:      The esophagus was incompletely examined.      An examination of the stomach was not performed.      An examination of the duodenum was not performed.      A small non-obstructing mucosal nodule was found in the anterior       commissure. Impression:               - No specimens collected.                           - Incomplete examination as unable to pass scope                            into esophagus due to anatomy.                           -  small vocal cord nodule at anterior comissure. Moderate Sedation:      Moderate (conscious) sedation was administered by the endoscopy nurse       and supervised by the endoscopist. The following parameters were       monitored: oxygen saturation, heart rate, blood pressure, CO2       capnography and response to care. Total physician intraservice time was       12 minutes. Recommendation:           - See the other procedure note for documentation of                            additional recommendations.                           - Diabetic (ADA) diet today.                           - Continue present medications.                           - UGIS in am                           - ENT consultation as outpatient. Procedure Code(s):         Diagnosis Code(s):        ---  Professional ---                           D50.0, Iron deficiency anemia secondary to blood                            loss (chronic) CPT copyright 2017 [employer]. All rights reserved. The codes documented in this report are preliminary and upon coder review may  be revised to meet current compliance requirements. Hildred Laser, MD Hildred Laser, MD 10/28/2017 8:40:43 AM This report has been signed electronically. Number of Addenda: 0

## 2017-10-29 ENCOUNTER — Inpatient Hospital Stay (HOSPITAL_COMMUNITY): Payer: Medicare Other

## 2017-10-29 LAB — CBC
HEMATOCRIT: 28.4 % — AB (ref 36.0–46.0)
HEMOGLOBIN: 8.5 g/dL — AB (ref 12.0–15.0)
MCH: 29.6 pg (ref 26.0–34.0)
MCHC: 29.9 g/dL — ABNORMAL LOW (ref 30.0–36.0)
MCV: 99 fL (ref 78.0–100.0)
Platelets: 163 10*3/uL (ref 150–400)
RBC: 2.87 MIL/uL — AB (ref 3.87–5.11)
RDW: 19.3 % — ABNORMAL HIGH (ref 11.5–15.5)
WBC: 6.7 10*3/uL (ref 4.0–10.5)

## 2017-10-29 LAB — RENAL FUNCTION PANEL
ANION GAP: 9 (ref 5–15)
Albumin: 2.9 g/dL — ABNORMAL LOW (ref 3.5–5.0)
BUN: 27 mg/dL — ABNORMAL HIGH (ref 8–23)
CALCIUM: 8.6 mg/dL — AB (ref 8.9–10.3)
CHLORIDE: 99 mmol/L (ref 98–111)
CO2: 27 mmol/L (ref 22–32)
Creatinine, Ser: 3.72 mg/dL — ABNORMAL HIGH (ref 0.44–1.00)
GFR calc Af Amer: 12 mL/min — ABNORMAL LOW (ref >60)
GFR calc non Af Amer: 11 mL/min — ABNORMAL LOW (ref >60)
Glucose, Bld: 100 mg/dL — ABNORMAL HIGH (ref 70–99)
POTASSIUM: 4.4 mmol/L (ref 3.5–5.1)
Phosphorus: 4.3 mg/dL (ref 2.5–4.6)
Sodium: 135 mmol/L (ref 135–145)

## 2017-10-29 LAB — TYPE AND SCREEN
ABO/RH(D): O POS
Antibody Screen: NEGATIVE
UNIT DIVISION: 0
Unit division: 0
Unit division: 0

## 2017-10-29 LAB — BPAM RBC
BLOOD PRODUCT EXPIRATION DATE: 201910122359
BLOOD PRODUCT EXPIRATION DATE: 201910132359
BLOOD PRODUCT EXPIRATION DATE: 201910132359
ISSUE DATE / TIME: 201909131520
ISSUE DATE / TIME: 201909132044
ISSUE DATE / TIME: 201909151323
UNIT TYPE AND RH: 5100
Unit Type and Rh: 5100
Unit Type and Rh: 5100

## 2017-10-29 LAB — GLUCOSE, CAPILLARY
GLUCOSE-CAPILLARY: 122 mg/dL — AB (ref 70–99)
Glucose-Capillary: 101 mg/dL — ABNORMAL HIGH (ref 70–99)
Glucose-Capillary: 95 mg/dL (ref 70–99)
Glucose-Capillary: 133 mg/dL — ABNORMAL HIGH (ref 70–99)

## 2017-10-29 NOTE — Progress Notes (Signed)
Pt not compliant with bed alarms. Pt educated on fall prevention plan but insistent that she does not want bed alarms.Bed in lowest position and patient has yellow non skid socks on.

## 2017-10-29 NOTE — Progress Notes (Signed)
PROGRESS NOTE   Jody [name]  VFI:[id number]  DOB: 12/11/1940  DOA: 10/26/2017 PCP: Monico Blitz, MD   Brief Admission Hx: Jody [name] is a 77 y.o. female with end-stage renal disease on hemodialysis Tuesday Thursday and Saturday was  sent to the ED by her dialysis center with concerns of worsening anemia.   She was noted to be hemoccult positive.    MDM/Assessment & Plan:   1. Acute on chronic blood loss anemia-patient is Hemoccult positive with black stools today.  Her hemoglobin is down 3 g from approximately 1 month ago.  The patient's anticoagulants will be held at this time until further work-up.  GI planning EGD/colon this morning 9/15.  The patient has received transfusion of 2 units PRBCs.   Transfused 1 additional unit PRBC 9/15.   Hg up to 8.5.  GI planning esophagram and upper GI series today.  2. End-stage renal disease on hemodialysis - Consulted the nephrology team for inpatient hemodialysis treatments.  The patient reports that she has hemodialysis on Tuesday, Thursday and Saturday.  Pt received hemodialysis 9/14 and tolerated it well.  3. Upper GI bleed - protonix ordered BID.  GI unable to pass scope for EGD.  Planning esophagram and Upper GI series today.    4. Chronic atrial fibrillation- resume home rate limiting medications however holding parameters given for metoprolol given current bradycardia.  Apixaban is being held at this time due to active GI bleeding. 5. Hypotension- RESOLVED.   6. Chronic diastolic congestive heart failure-Volume removed with HD.  Will give lasix IV after PRBC transfusion.  HD scheduled for Tues.  7. Type 2 diabetes mellitus with nephropathy- we will monitor blood glucose levels and provide supplemental sliding scale insulin coverage. 8. COPD-stable follow clinically. 9. Essential hypertension- resume home blood pressure medications with holding parameters as noted above. 10. Right ankle sprain - xray right ankle no fracture seen.      DVT Prophylaxis: TED hose Code Status: Full Family Communication: Patient at bedside Disposition Plan: Pt not medically stable for discharge today due to acute GI bleed  Consultants:  GI  Nephrology  Procedures: Brief EGD and colonoscopy note 10/28/17 Dr. Laural Golden  Incomplete EGD as unable to advance scope across the hypopharynx into the esophagus. Small nodule/polyp noted anterior confluence of the vocal cords. Normal hypopharynx.  Colonoscopy completed to cecum. Prep adequate. Supple diverticulitis cecum and ascending colon without mild bleed. Small amount of burgundy blood and free-floating clot noted at hepatic flexure. Small external hemorrhoids.  Subjective: Pt complain of headache because of not eating. She is NPO and being prepped for EGD/colon this morning.   Objective: Vitals:   10/28/17 1429 10/28/17 1601 10/28/17 2130 10/28/17 2219  BP: (!) 128/47 137/61  (!) 127/59  Pulse: (!) 52 (!) 50  (!) 43  Resp: 20 16  20   Temp: 98.6 F (37 C) 98.3 F (36.8 C)  98.4 F (36.9 C)  TempSrc: Oral Oral  Oral  SpO2: 95% 100% 92% 100%  Weight:      Height:        Intake/Output Summary (Last 24 hours) at 10/29/2017 4166 Last data filed at 10/28/2017 1857 Gross per 24 hour  Intake 318.33 ml  Output 500 ml  Net -181.67 ml   Filed Weights   10/26/17 1121 10/26/17 1542 10/27/17 1735  Weight: 96.6 kg 95.3 kg 97.8 kg    REVIEW OF SYSTEMS  As per history otherwise all reviewed and reported negative  Exam:  General exam: awake, alert,  NAD, cooperative.  Respiratory system: CTA bilateral.  No increased work of breathing. Cardiovascular system: S1 & S2 heard. Gastrointestinal system: Abdomen is nondistended, soft and nontender. Normal bowel sounds heard. Central nervous system: Alert and oriented. No focal neurological deficits. Extremities: right ankle edema.   Data Reviewed: Basic Metabolic Panel: Recent Labs  Lab 10/26/17 1200 10/27/17 0607  10/28/17 0559  NA 138 138 135  K 4.1 4.6 3.7  CL 99 100 98  CO2 29 28 29   GLUCOSE 117* 115* 104*  BUN 28* 36* 15  CREATININE 3.73* 4.27* 2.43*  CALCIUM 8.5* 8.8* 8.4*  MG  --  2.7*  --   PHOS  --   --  3.4   Liver Function Tests: Recent Labs  Lab 10/27/17 0607 10/28/17 0559  AST 24  --   ALT 18  --   ALKPHOS 79  --   BILITOT 1.1  --   PROT 6.3*  --   ALBUMIN 3.1* 3.1*   No results for input(s): LIPASE, AMYLASE in the last 168 hours. No results for input(s): AMMONIA in the last 168 hours. CBC: Recent Labs  Lab 10/26/17 1200 10/27/17 0607 10/28/17 0559  WBC 5.0 5.4 6.3  NEUTROABS 3.4  --   --   HGB 6.6* 8.3* 7.8*  HCT 22.2* 27.4* 26.5*  MCV 104.2* 100.4* 101.5*  PLT 204 173 179   Cardiac Enzymes: No results for input(s): CKTOTAL, CKMB, CKMBINDEX, TROPONINI in the last 168 hours. CBG (last 3)  Recent Labs    10/28/17 1107 10/28/17 1557 10/28/17 2221  GLUCAP 197* 147* 113*   No results found for this or any previous visit (from the past 240 hour(s)).   Studies: No results found. Scheduled Meds: . sodium chloride   Intravenous Once  . amLODipine  10 mg Oral Daily  . atorvastatin  40 mg Oral QHS  . Chlorhexidine Gluconate Cloth  6 each Topical Q0600  . diltiazem  180 mg Oral Daily  . docusate sodium  100 mg Oral BID  . epoetin (EPOGEN/PROCRIT) injection  6,000 Units Subcutaneous Q T,Th,Sa-HD  . gabapentin  300 mg Oral TID  . hydrALAZINE  100 mg Oral Q8H  . insulin aspart  0-9 Units Subcutaneous TID WC  . metoprolol tartrate  12.5 mg Oral BID  . mometasone-formoterol  2 puff Inhalation BID  . multivitamin with minerals  1 tablet Oral Daily  . pantoprazole  40 mg Oral BID  . sodium bicarbonate  650 mg Oral BID   Continuous Infusions: . sodium chloride    . sodium chloride      Active Problems:   Chronic blood loss anemia   Essential hypertension   Type 2 diabetes mellitus with other circulatory complications (HCC)   Gait difficulty   COPD  (chronic obstructive pulmonary disease) (HCC)   Anemia in chronic kidney disease, Stage IV   Atrial fibrillation (HCC)   Elevated LFTs   Chronic diastolic CHF (congestive heart failure) (HCC)   Type 2 diabetes mellitus with nephropathy ([place])   ESRD on dialysis ([place])   Arthritis   Acute on chronic blood loss anemia  Time spent:   Irwin Brakeman, MD, FAAFP [facility] Pager [phone]  If 7PM-7AM, please contact night-coverage [email or web address] Password [email or web address] 10/29/2017, 6:27 AM    LOS: 3 days

## 2017-10-29 NOTE — Progress Notes (Signed)
  Subjective:  Patient has no complaints.  She denies dysphagia.  She also denies abdominal pain melena or rectal bleeding.  She states she was not able to lie flat for upper GI study to be completed.  Objective: Blood pressure (!) 181/83, pulse (!) 59, temperature 98.3 F (36.8 C), temperature source Oral, resp. rate 20, height 5\' 7"  (1.702 m), weight 97.8 kg, SpO2 97 %. Patient is alert and in no acute distress. Abdomen is full but soft nontender with organomegaly or masses.  Labs/studies Results:  Recent Labs    2017/11/24 0607 10/28/17 0559 10/29/17 0348  WBC 5.4 6.3 6.7  HGB 8.3* 7.8* 8.5*  HCT 27.4* 26.5* 28.4*  PLT 173 179 163    BMET  Recent Labs    11/24/2017 0607 10/28/17 0559 10/29/17 0348  NA 138 135 135  K 4.6 3.7 4.4  CL 100 98 99  CO2 28 29 27   GLUCOSE 115* 104* 100*  BUN 36* 15 27*  CREATININE 4.27* 2.43* 3.72*  CALCIUM 8.8* 8.4* 8.6*    LFT  Recent Labs    11/24/17 0607 10/28/17 0559 10/29/17 0348  PROT 6.3*  --   --   ALBUMIN 3.1* 3.1* 2.9*  AST 24  --   --   ALT 18  --   --   ALKPHOS 79  --   --   BILITOT 1.1  --   --      Esophagogram images reviewed.  No evidence of Zenker's diverticulum.  She has tertiary contractions but no stricture.  Assessment:  #1.  GI bleed.  Patient stool was noted to be heme positive.  It was checked when she was sent to the emergency room with low hemoglobin.  EGD was attempted yesterday but scope could not be passed across upper esophageal sphincter.  I was concerned that she Zenker's diverticulum.  She had colonoscopy revealing multiple diverticula at cecum and ascending colon and small amount of burgundy blood in free-floating clot at hepatic flexure.  No bleeding lesion was identified.  No Zenker's diverticulum noted on barium study today.  Patient unable to proceed with upper GI series.  Therefore she will need EGD looking for source of GI bleed.  #2.  Acute on chronic anemia.  Since hemoglobin was over 11 g in  January 2019 and she has been running between 9 and 10.  Hemoglobin on admission 6.6 g.  Patient has received 3 units of PRBCs.  She is hemodynamically stable.  #3.  End-stage renal disease.  Patient is on dialysis.  She was dialyzed 2 days ago.  She is being followed by Dr. Lowanda Foster.  #4.  History of atrial fibrillation as well as coronary artery disease.  Aspirin and cilostazol and Eliquis are on hold.   Recommendations:  Esophagogastroduodenoscopy under monitored anesthesia care in a.m. Patient is agreeable.

## 2017-10-29 NOTE — Care Management Important Message (Signed)
Important Message  Patient Details  [name] MRN: [id number] Date of Birth: 06-02-1940   Medicare Important Message Given:  Yes    Shelda Altes 10/29/2017, 10:29 AM

## 2017-10-29 NOTE — Progress Notes (Signed)
Subjective: Interval History: has no complaint of abdominal pain, no nausea or vomiting.  Patient also denies any difficulty breathing..  Objective: Vital signs in last 24 hours: Temp:  [98.3 F (36.8 C)-98.8 F (37.1 C)] 98.3 F (36.8 C) (09/16 0623) Pulse Rate:  [43-55] 45 (09/16 0623) Resp:  [9-20] 20 (09/16 0623) BP: (127-139)/(47-72) 139/72 (09/16 0623) SpO2:  [92 %-100 %] 97 % (09/16 0751) Weight change:   Intake/Output from previous day: 09/15 0701 - 09/16 0700 In: 318.3 [I.V.:3.3; Blood:315] Out: 1100 [Urine:1100] Intake/Output this shift: No intake/output data recorded.  General appearance: alert, cooperative and no distress Resp: clear to auscultation bilaterally Cardio: regular rate and rhythm Extremities: No edema  Lab Results: Recent Labs    10/28/17 0559 10/29/17 0348  WBC 6.3 6.7  HGB 7.8* 8.5*  HCT 26.5* 28.4*  PLT 179 163   BMET:  Recent Labs    10/28/17 0559 10/29/17 0348  NA 135 135  K 3.7 4.4  CL 98 99  CO2 29 27  GLUCOSE 104* 100*  BUN 15 27*  CREATININE 2.43* 3.72*  CALCIUM 8.4* 8.6*   No results for input(s): PTH in the last 72 hours. Iron Studies: No results for input(s): IRON, TIBC, TRANSFERRIN, FERRITIN in the last 72 hours.  Studies/Results: No results found.  I have reviewed the patient's current medications.  Assessment/Plan: 1] end-stage renal disease: She is status post hemodialysis on Saturday.  Presently patient is a symptomatic.  Potassium is normal. 2] anemia: Possibly a combination of GI bleeding and anemia of chronic disease.  Patient had colonoscopy yet yesterday and she is going to have upper GI endoscopy today.  Her hemoglobin is stable. 3] hypertension: Her blood pressure is reasonably controlled 4] bone and mineral disorder: Her calcium and phosphorus is range 5] fluid management: Patient presently does not have any sign of fluid overload 6] diabetes: Her blood sugar is reasonably controlled Plan:1] Patient  does not require dialysis today 2] we will dialyze her tomorrow for 4 hours 3] we will check her renal panel and CBC is a morning.   LOS: 3 days   [name] 10/29/2017,8:26 AM

## 2017-10-30 ENCOUNTER — Encounter (HOSPITAL_COMMUNITY): Payer: Self-pay

## 2017-10-30 ENCOUNTER — Encounter (HOSPITAL_COMMUNITY)
Admission: EM | Disposition: A | Discharge status: Home Health | Payer: Self-pay | Source: Home / Self Care | Attending: Family Medicine

## 2017-10-30 LAB — RENAL FUNCTION PANEL
ANION GAP: 9 (ref 5–15)
Albumin: 3.1 g/dL — ABNORMAL LOW (ref 3.5–5.0)
BUN: 44 mg/dL — ABNORMAL HIGH (ref 8–23)
CALCIUM: 8.8 mg/dL — AB (ref 8.9–10.3)
CO2: 28 mmol/L (ref 22–32)
Chloride: 99 mmol/L (ref 98–111)
Creatinine, Ser: 4.88 mg/dL — ABNORMAL HIGH (ref 0.44–1.00)
GFR calc Af Amer: 9 mL/min — ABNORMAL LOW (ref >60)
GFR calc non Af Amer: 8 mL/min — ABNORMAL LOW (ref >60)
GLUCOSE: 101 mg/dL — AB (ref 70–99)
Phosphorus: 4.7 mg/dL — ABNORMAL HIGH (ref 2.5–4.6)
Potassium: 4.4 mmol/L (ref 3.5–5.1)
SODIUM: 136 mmol/L (ref 135–145)

## 2017-10-30 LAB — GLUCOSE, CAPILLARY
GLUCOSE-CAPILLARY: 115 mg/dL — AB (ref 70–99)
GLUCOSE-CAPILLARY: 175 mg/dL — AB (ref 70–99)
Glucose-Capillary: 105 mg/dL — ABNORMAL HIGH (ref 70–99)
Glucose-Capillary: 130 mg/dL — ABNORMAL HIGH (ref 70–99)

## 2017-10-30 LAB — CBC
HCT: 30 % — ABNORMAL LOW (ref 36.0–46.0)
HEMOGLOBIN: 9.3 g/dL — AB (ref 12.0–15.0)
MCH: 30.5 pg (ref 26.0–34.0)
MCHC: 31 g/dL (ref 30.0–36.0)
MCV: 98.4 fL (ref 78.0–100.0)
Platelets: 175 10*3/uL (ref 150–400)
RBC: 3.05 MIL/uL — ABNORMAL LOW (ref 3.87–5.11)
RDW: 18.9 % — ABNORMAL HIGH (ref 11.5–15.5)
WBC: 6.2 10*3/uL (ref 4.0–10.5)

## 2017-10-30 SURGERY — ESOPHAGOGASTRODUODENOSCOPY (EGD) WITH PROPOFOL
Anesthesia: Monitor Anesthesia Care

## 2017-10-30 MED ORDER — HEPARIN SODIUM (PORCINE) 1000 UNIT/ML IJ SOLN
INTRAMUSCULAR | Status: AC
  Administered 2017-10-30: 3800 [IU] via INTRAVENOUS_CENTRAL
  Filled 2017-10-30: qty 6

## 2017-10-30 MED ORDER — GABAPENTIN 300 MG PO CAPS: 300.0000 mg | ORAL_CAPSULE | Freq: Every day | ORAL | Status: DC

## 2017-10-30 MED ORDER — GABAPENTIN 300 MG PO CAPS
300.0000 mg | ORAL_CAPSULE | Freq: Every day | ORAL | Status: DC
  Administered 2017-10-30: 300 mg via ORAL
  Filled 2017-10-30: qty 1

## 2017-10-30 MED ORDER — EPOETIN ALFA 10000 UNIT/ML IJ SOLN
INTRAMUSCULAR | Status: AC
  Administered 2017-10-30: 6000 [IU] via SUBCUTANEOUS
  Filled 2017-10-30: qty 1

## 2017-10-30 NOTE — Evaluation (Signed)
Physical Therapy Evaluation Patient Details [name] MRN: [id number] DOB: 01-03-1941 Today's Date: 10/30/2017   History of Present Illness  Jody [name] is a 77 y.o. female with end-stage renal disease on hemodialysis Tuesday Thursday and Saturday was apparently sent to the ED by her dialysis center with concerns of worsening anemia.  The patient reports that she was told that she likely would require a blood transfusion as her hemoglobin has declined to 6.6 down from 9 approximately 1 month ago.  The patient is essentially asymptomatic at this time and denies shortness of breath and chest pain.  The patient did have hemodialysis yesterday and tolerated it well.  The patient is chronically anticoagulated with apixaban for atrial fibrillation.  The patient denies blood in the stools but has had what she recalls as "brown colored" stools.  The patient had a positive Hemoccult testing done in the emergency department with black stools noted by ED provider that was Hemoccult positive.  The patient has been typed and crossed and will be transfused 2 units of packed red blood cells.  Given that the patient is actively having a GI bleed she will be admitted for further evaluation and management.  The on-call gastroenterologist has been consulted as well.  The patient will be admitted to telemetry for monitoring and treatment of the anemia.  Anticoagulants will be held temporarily.    Clinical Impression  Patient presents up in chair (assisted by nursing staff).  Patient functioning near baseline for functional mobility and gait other than having difficulty with sit to stands secondary to legs stiff after sitting a long time per patient, required repeated attempts and assist to sit to stand from chair, after that able to get into/out of bed without problem and stood from bedside using RW and ambulated to doorway and back to chair with mostly supervision.  Patient states her niece can stay with her  overnight if necessary.  Patient will benefit from continued physical therapy in hospital and recommended venue below to increase strength, balance, endurance for safe ADLs and gait.    Follow Up Recommendations Home health PT;Supervision - Intermittent;Supervision for mobility/OOB    Equipment Recommendations  None recommended by PT    Recommendations for Other Services       Precautions / Restrictions Precautions Precautions: Fall Restrictions Weight Bearing Restrictions: No      Mobility  Bed Mobility Overal bed mobility: Modified Independent             General bed mobility comments: has to use bed rail for supine to sitting  Transfers Overall transfer level: Needs assistance Equipment used: Rolling walker (2 wheeled) Transfers: Sit to/from [facility] Sit to Stand: Min assist;Min guard Stand pivot transfers: Min guard       General transfer comment: required repeated attempts for sit to stands from lounge chair using RW, did better from bedside  Ambulation/Gait Ambulation/Gait assistance: Min guard Gait Distance (Feet): 20 Feet Assistive device: Rolling walker (2 wheeled) Gait Pattern/deviations: Decreased step length - right;Decreased step length - left;Decreased stride length Gait velocity: slow   General Gait Details: slow slightly labored cadence without loss of balance, limited secondary to fatigue  Stairs            Wheelchair Mobility    Modified Rankin (Stroke Patients Only)       Balance Overall balance assessment: Needs assistance Sitting-balance support: Feet supported;No upper extremity supported Sitting balance-Leahy Scale: Good     Standing balance support: During functional activity;Bilateral  upper extremity supported Standing balance-Leahy Scale: Fair                               Pertinent Vitals/Pain Pain Assessment: No/denies pain    Home Living Family/patient expects to be discharged  to:: Private residence Living Arrangements: Alone Available Help at Discharge: Family;Personal care attendant Type of Home: House Home Access: Ramped entrance     Home Layout: One level Home Equipment: Environmental consultant - 2 wheels;Shower seat      Prior Function Level of Independence: Needs assistance   Gait / Transfers Assistance Needed: household and short distanced community ambulator with RW  ADL's / Homemaking Assistance Needed: has home aides from 9 am to 1 pm 3 days/week on MWF, her neice visits daily and can stay overnight if needed        Hand Dominance   Dominant Hand: Right    Extremity/Trunk Assessment   Upper Extremity Assessment Upper Extremity Assessment: Generalized weakness    Lower Extremity Assessment Lower Extremity Assessment: Generalized weakness    Cervical / Trunk Assessment Cervical / Trunk Assessment: Kyphotic  Communication   Communication: No difficulties  Cognition Arousal/Alertness: Awake/alert Behavior During Therapy: WFL for tasks assessed/performed Overall Cognitive Status: Within Functional Limits for tasks assessed                                        General Comments      Exercises     Assessment/Plan    PT Assessment Patient needs continued PT services  PT Problem List Decreased strength;Decreased balance;Decreased activity tolerance;Decreased mobility       PT Treatment Interventions Gait training;Functional mobility training;Therapeutic activities;Therapeutic exercise;Patient/family education    PT Goals (Current goals can be found in the Care Plan section)  Acute Rehab PT Goals Patient Stated Goal: return home with home aides and family to assist PT Goal Formulation: With patient Time For Goal Achievement: 11/02/17 Potential to Achieve Goals: Good    Frequency Min 3X/week   Barriers to discharge        Co-evaluation               AM-PAC PT "6 Clicks" Daily Activity  Outcome Measure  Difficulty turning over in bed (including adjusting bedclothes, sheets and blankets)?: None Difficulty moving from lying on back to sitting on the side of the bed? : None Difficulty sitting down on and standing up from a chair with arms (e.g., wheelchair, bedside commode, etc,.)?: A Little Help needed moving to and from a bed to chair (including a wheelchair)?: A Little Help needed walking in hospital room?: A Little Help needed climbing 3-5 steps with a railing? : A Lot 6 Click Score: 19    End of Session   Activity Tolerance: Patient tolerated treatment well;Patient limited by fatigue Patient left: in chair;with call bell/phone within reach Nurse Communication: Mobility status PT Visit Diagnosis: Unsteadiness on feet (R26.81);Other abnormalities of gait and mobility (R26.89);Muscle weakness (generalized) (M62.81)    Time: 8786-7672 PT Time Calculation (min) (ACUTE ONLY): 27 min   Charges:   PT Evaluation $PT Eval Moderate Complexity: 1 Mod PT Treatments $Therapeutic Activity: 23-37 mins        3:51 PM, 10/30/17 Lonell Grandchild, MPT Physical Therapist with [facility] [phone] office [phone] mobile phone

## 2017-10-30 NOTE — Procedures (Signed)
      HEMODIALYSIS TREATMENT NOTE:   3 hour heparin-free dialysis completed via right chest wall tunneled catheter. Exit site unremarkable. Goal met: 2.5 liters removed without interruption in ultrafiltration.  Pt elected to end HD session 1 hour early ("I only run 3 hours.").  All blood was returned.  Rockwell Alexandria, RN

## 2017-10-30 NOTE — Progress Notes (Signed)
  Subjective:  Patient has no complaints.  She is hungry.  She denies shortness of breath chest or abdominal pain.  She has not had a bowel movement since seen yesterday.  Objective: Blood pressure (!) 146/67, pulse (!) 48, temperature 98.6 F (37 C), temperature source Oral, resp. rate 20, height 5\' 7"  (1.702 m), weight 97.8 kg, SpO2 97 %. Patient is alert and in no acute distress. Exam with regular rhythm normal S1 and S2.  She has grade 2/6 systolic ejection murmur heard at left sternal border and aortic area. Lungs are clear to auscultation. Abdomen is full but soft and nontender with organomegaly or masses.  Labs/studies Results:  Recent Labs    13-Nov-2017 0559 10/29/17 0348 10/30/17 0431  WBC 6.3 6.7 6.2  HGB 7.8* 8.5* 9.3*  HCT 26.5* 28.4* 30.0*  PLT 179 163 175    BMET  Recent Labs    11-13-17 0559 10/29/17 0348 10/30/17 0431  NA 135 135 136  K 3.7 4.4 4.4  CL 98 99 99  CO2 29 27 28   GLUCOSE 104* 100* 101*  BUN 15 27* 44*  CREATININE 2.43* 3.72* 4.88*  CALCIUM 8.4* 8.6* 8.8*    LFT  Recent Labs    11-13-17 0559 10/29/17 0348 10/30/17 0431  ALBUMIN 3.1* 2.9* 3.1*       Assessment:  #1.  GI bleed possibly chronic or intermittent.  Colonoscopy revealed multiple diverticula at cecum and ascending colon without stigmata of bleed.  She did have small amount of burgundy blood in a free-floating clot in the region of the hepatic flexure but no lesion was identified.  EGD was not successful.  Esophagogram revealed esophageal dysmotility with no Zenker's diverticulum.  She could not do upper GI study.  #2.  Acute on chronic anemia secondary GI bleed.  Patient has received 3 units of PRBCs.  Her hemoglobin is above 9 g.  #3.  End-stage renal disease.  Patient for dialysis today.   Recommendations:  Esophagogastroduodenoscopy under monitored anesthesia care later today.

## 2017-10-30 NOTE — Plan of Care (Signed)
  Problem: Acute Rehab PT Goals(only PT should resolve) Goal: Patient Will Transfer Sit To/From Stand Outcome: Progressing Flowsheets (Taken 10/30/2017 1551) Patient will transfer sit to/from stand: with supervision Goal: Pt Will Transfer Bed To Chair/Chair To Bed Outcome: Progressing Flowsheets (Taken 10/30/2017 1551) Pt will Transfer Bed to Chair/Chair to Bed: with modified independence Goal: Pt Will Ambulate Outcome: Progressing Flowsheets (Taken 10/30/2017 1551) Pt will Ambulate: with supervision   3:52 PM, 10/30/17 Lonell Grandchild, MPT Physical Therapist with [facility] [phone] office [phone] mobile phone

## 2017-10-30 NOTE — Progress Notes (Signed)
PROGRESS NOTE   Jody [name]  ZOX:[id number]  DOB: Jul 21, 1940  DOA: 10/26/2017 PCP: Monico Blitz, MD  Brief Admission Hx: Jody [name] is a 77 y.o. female with end-stage renal disease on hemodialysis Tuesday Thursday and Saturday was  sent to the ED by her dialysis center with concerns of worsening anemia.   She was noted to be hemoccult positive.    MDM/Assessment & Plan:   1. Acute on chronic blood loss anemia-patient is Hemoccult positive with black stools today.  Her hemoglobin is down 3 g from approximately 1 month ago.  The patient's anticoagulants will be held at this time until further work-up.  GI planning EGD/colon this morning 9/15.  The patient has received transfusion of 2 units PRBCs.   Transfused 1 additional unit PRBC 9/15.   Hg up to 8.5.  Esophagogram revealed esophageal dysmotility with no Zenker's diverticulum.  She could not do upper GI study.   GI planning EGD 9/17 under monitored anesthesia care.  2. End-stage renal disease on hemodialysis - Consulted the nephrology team for inpatient hemodialysis treatments.  The patient reports that she has hemodialysis on Tuesday, Thursday and Saturday.  Pt received hemodialysis 9/14 and tolerated it well.  Hemodialysis scheduled for 9/17. Appreciate nephrologist assistance.  3. Upper GI bleed - protonix ordered BID.  GI unable to pass scope for EGD.  EGD under MAC planned for 9/17.    4. Chronic atrial fibrillation- resume home rate limiting medications however holding parameters given for metoprolol given current bradycardia.  Apixaban is being held at this time due to active GI bleeding. 5. Hypotension- RESOLVED.   6. Chronic diastolic congestive heart failure-Volume removed with HD.  Will give lasix IV after PRBC transfusion.  HD scheduled for 9/17.   7. Type 2 diabetes mellitus with nephropathy- we will monitor blood glucose levels and provide supplemental sliding scale insulin coverage. 8. COPD-stable follow  clinically. 9. Essential hypertension- resume home blood pressure medications with holding parameters as noted above. 10. Right ankle sprain - xray right ankle no fracture seen.   PT requested for ambulation.   DVT Prophylaxis: TED hose Code Status: Full Family Communication: Patient at bedside Disposition Plan: Pt not medically stable for discharge today due to acute GI bleed, EGD under MAC planned today  Consultants:  GI  Nephrology  Procedures: Brief EGD and colonoscopy note 10/28/17 Dr. Laural Golden  Incomplete EGD as unable to advance scope across the hypopharynx into the esophagus. Small nodule/polyp noted anterior confluence of the vocal cords. Normal hypopharynx.  Colonoscopy completed to cecum. Prep adequate. Supple diverticulitis cecum and ascending colon without mild bleed. Small amount of burgundy blood and free-floating clot noted at hepatic flexure. Small external hemorrhoids.  Subjective: Pt says that she is hungry.   Objective: Vitals:   10/29/17 2020 10/29/17 2112 10/30/17 0546 10/30/17 0817  BP:  (!) 130/59 (!) 146/67   Pulse:  (!) 42 (!) 48   Resp:      Temp:  98.6 F (37 C) 98.6 F (37 C)   TempSrc:  Oral Oral   SpO2: 95% 100% 97% 90%  Weight:      Height:        Intake/Output Summary (Last 24 hours) at 10/30/2017 0856 Last data filed at 10/30/2017 0547 Gross per 24 hour  Intake 600 ml  Output 750 ml  Net -150 ml   Filed Weights   10/26/17 1121 10/26/17 1542 10/27/17 1735  Weight: 96.6 kg 95.3 kg 97.8 kg   REVIEW OF SYSTEMS  As per history otherwise all reviewed and reported negative  Exam:  General exam: awake, alert, NAD, cooperative.  Respiratory system: CTA bilateral.  No increased work of breathing. Cardiovascular system: S1 & S2 heard. Gastrointestinal system: Abdomen is nondistended, soft and nontender. Normal bowel sounds heard. Central nervous system: Alert and oriented. No focal neurological deficits. Extremities: right  ankle edema.   Data Reviewed: Basic Metabolic Panel: Recent Labs  Lab 10/26/17 1200 10/27/17 0607 10/28/17 0559 10/29/17 0348 10/30/17 0431  NA 138 138 135 135 136  K 4.1 4.6 3.7 4.4 4.4  CL 99 100 98 99 99  CO2 29 28 29 27 28   GLUCOSE 117* 115* 104* 100* 101*  BUN 28* 36* 15 27* 44*  CREATININE 3.73* 4.27* 2.43* 3.72* 4.88*  CALCIUM 8.5* 8.8* 8.4* 8.6* 8.8*  MG  --  2.7*  --   --   --   PHOS  --   --  3.4 4.3 4.7*   Liver Function Tests: Recent Labs  Lab 10/27/17 0607 10/28/17 0559 10/29/17 0348 10/30/17 0431  AST 24  --   --   --   ALT 18  --   --   --   ALKPHOS 79  --   --   --   BILITOT 1.1  --   --   --   PROT 6.3*  --   --   --   ALBUMIN 3.1* 3.1* 2.9* 3.1*   No results for input(s): LIPASE, AMYLASE in the last 168 hours. No results for input(s): AMMONIA in the last 168 hours. CBC: Recent Labs  Lab 10/26/17 1200 10/27/17 0607 10/28/17 0559 10/29/17 0348 10/30/17 0431  WBC 5.0 5.4 6.3 6.7 6.2  NEUTROABS 3.4  --   --   --   --   HGB 6.6* 8.3* 7.8* 8.5* 9.3*  HCT 22.2* 27.4* 26.5* 28.4* 30.0*  MCV 104.2* 100.4* 101.5* 99.0 98.4  PLT 204 173 179 163 175   Cardiac Enzymes: No results for input(s): CKTOTAL, CKMB, CKMBINDEX, TROPONINI in the last 168 hours. CBG (last 3)  Recent Labs    10/29/17 1640 10/29/17 2211 10/30/17 0736  GLUCAP 122* 133* 105*   No results found for this or any previous visit (from the past 240 hour(s)).   Studies: Dg Esophagus  Result Date: 10/29/2017 CLINICAL DATA:  77 year old female with history of incomplete EGD. History of gastrointestinal bleed. EXAM: ESOPHOGRAM/BARIUM SWALLOW TECHNIQUE: Combined double contrast and single contrast examination performed using effervescent crystals, thick barium liquid, and thin barium liquid. FLUOROSCOPY TIME:  Fluoroscopy Time:  1 minutes and 36 seconds Radiation Exposure Index (if provided by the fluoroscopic device): 23.1 mGy Number of Acquired Spot Images: 0 COMPARISON:  None.  FINDINGS: Preprocedural KUB demonstrates a nonobstructive bowel gas pattern. Double contrast images of the esophagus demonstrate normal appearance of the esophageal mucosa. No esophageal mass, stricture or esophageal ring. Failure to normally propagate any primary peristaltic waves. Rare tertiary contractions were noted. No hiatal hernia. Attempts were made to position the patient for double contrast upper GI, but the patient was unable to roll into the prone position, and as such, the stomach could not be adequately coated or imaged. IMPRESSION: 1. Nonspecific esophageal motility disorder. Occasional tertiary contractions. These results were called by telephone at the time of interpretation on 10/29/2017 at 10:28 am to Dr. Hildred Laser, who verbally acknowledged these results. Electronically Signed   By: Vinnie Langton M.D.   On: 10/29/2017 10:35   Scheduled Meds: . sodium  chloride   Intravenous Once  . amLODipine  10 mg Oral Daily  . atorvastatin  40 mg Oral QHS  . Chlorhexidine Gluconate Cloth  6 each Topical Q0600  . diltiazem  180 mg Oral Daily  . docusate sodium  100 mg Oral BID  . epoetin (EPOGEN/PROCRIT) injection  6,000 Units Subcutaneous Q T,Th,Sa-HD  . gabapentin  300 mg Oral TID  . hydrALAZINE  100 mg Oral Q8H  . insulin aspart  0-9 Units Subcutaneous TID WC  . metoprolol tartrate  12.5 mg Oral BID  . mometasone-formoterol  2 puff Inhalation BID  . multivitamin with minerals  1 tablet Oral Daily  . pantoprazole  40 mg Oral BID  . sodium bicarbonate  650 mg Oral BID   Continuous Infusions: . sodium chloride    . sodium chloride      Active Problems:   Chronic blood loss anemia   Essential hypertension   Type 2 diabetes mellitus with other circulatory complications (HCC)   Gait difficulty   COPD (chronic obstructive pulmonary disease) (HCC)   Anemia in chronic kidney disease, Stage IV   Atrial fibrillation (HCC)   Elevated LFTs   Chronic diastolic CHF (congestive heart  failure) (HCC)   Type 2 diabetes mellitus with nephropathy ([place])   ESRD on dialysis ([place])   Arthritis   Acute on chronic blood loss anemia  Time spent:   Irwin Brakeman, MD, FAAFP [facility] Pager [phone]  If 7PM-7AM, please contact night-coverage [email or web address] Password [email or web address] 10/30/2017, 8:56 AM    LOS: 4 days

## 2017-10-30 NOTE — Progress Notes (Signed)
Patient was supposed to get clear liquid breakfast this morning. Saw the patient before 8 AM and advised to for change in plan.  However she got diabetic renal diet and decided to eat it even though she knew that she is supposed to have EGD. Therefore procedure will be rescheduled for tomorrow.

## 2017-10-30 NOTE — Progress Notes (Signed)
Subjective: Interval History: Patient is feeling good.  She denies any nausea or vomiting.  No history of GI bleeding.  Denies also any difficulty breathing.  Objective: Vital signs in last 24 hours: Temp:  [98.6 F (37 C)] 98.6 F (37 C) (09/17 0546) Pulse Rate:  [42-59] 48 (09/17 0546) Resp:  [20] 20 (09/16 1737) BP: (130-181)/(59-83) 146/67 (09/17 0546) SpO2:  [94 %-100 %] 97 % (09/17 0546) Weight change:   Intake/Output from previous day: 09/16 0701 - 09/17 0700 In: 600 [P.O.:600] Out: 750 [Urine:750] Intake/Output this shift: No intake/output data recorded.  Generally patient is alert and in no apparent distress Chest is clear to auscultation Heart exam regular rate and rhythm no murmur no S3 Extremities no edema  Lab Results: Recent Labs    10/29/17 0348 10/30/17 0431  WBC 6.7 6.2  HGB 8.5* 9.3*  HCT 28.4* 30.0*  PLT 163 175   BMET:  Recent Labs    10/29/17 0348 10/30/17 0431  NA 135 136  K 4.4 4.4  CL 99 99  CO2 27 28  GLUCOSE 100* 101*  BUN 27* 44*  CREATININE 3.72* 4.88*  CALCIUM 8.6* 8.8*   No results for input(s): PTH in the last 72 hours. Iron Studies: No results for input(s): IRON, TIBC, TRANSFERRIN, FERRITIN in the last 72 hours.  Studies/Results: Dg Esophagus  Result Date: 10/29/2017 CLINICAL DATA:  77 year old female with history of incomplete EGD. History of gastrointestinal bleed. EXAM: ESOPHOGRAM/BARIUM SWALLOW TECHNIQUE: Combined double contrast and single contrast examination performed using effervescent crystals, thick barium liquid, and thin barium liquid. FLUOROSCOPY TIME:  Fluoroscopy Time:  1 minutes and 36 seconds Radiation Exposure Index (if provided by the fluoroscopic device): 23.1 mGy Number of Acquired Spot Images: 0 COMPARISON:  None. FINDINGS: Preprocedural KUB demonstrates a nonobstructive bowel gas pattern. Double contrast images of the esophagus demonstrate normal appearance of the esophageal mucosa. No esophageal mass,  stricture or esophageal ring. Failure to normally propagate any primary peristaltic waves. Rare tertiary contractions were noted. No hiatal hernia. Attempts were made to position the patient for double contrast upper GI, but the patient was unable to roll into the prone position, and as such, the stomach could not be adequately coated or imaged. IMPRESSION: 1. Nonspecific esophageal motility disorder. Occasional tertiary contractions. These results were called by telephone at the time of interpretation on 10/29/2017 at 10:28 am to Dr. Hildred Laser, who verbally acknowledged these results. Electronically Signed   By: Vinnie Langton M.D.   On: 10/29/2017 10:35    I have reviewed the patient's current medications.  Assessment/Plan: 1] end-stage renal disease: She is status post hemodialysis on Saturday.  Patient does not have uremic signs and symptoms.  Her potassium remains normal.  Patient is due for dialysis today.  She is 2] anemia: Possibly a combination of GI bleeding and anemia of chronic disease.  Patient is status post blood transfusion.  Her hemoglobin remains a stable.  3] hypertension: Her blood pressure is reasonably controlled 4] bone and mineral disorder: Her calcium and phosphorus is range 5] fluid management: Patient denies any difficulty breathing.  No sign of fluid overload. 6] diabetes: Her blood sugar is reasonably controlled Plan:1] we will dialyze her today for 4 hours 2] we will check her renal panel and CBC is a morning. 3] we will continue with Epogen during dialysis.   LOS: 4 days   Zaidee Rion S 10/30/2017,7:55 AM

## 2017-10-30 NOTE — Progress Notes (Signed)
PT Cancellation Note  Patient Details [name] MRN: [id number] DOB: 1940-06-06   Cancelled Treatment:    Reason Eval/Treat Not Completed: Other (comment)(Chart reviewed, RN consulted. Pt upset at this time that she has not received her meal yet, which nurse is about to bring to room. Pt also pendind dialysis treatment this afternoon. Will hold evaluation at this time, so pt does not miss meal prior to HD.)  1:53 PM, 10/30/17 [name], PT, DPT Physical Therapist - [place] [phone] (Office)    [name] 10/30/2017, 1:53 PM

## 2017-10-31 ENCOUNTER — Encounter (HOSPITAL_COMMUNITY)
Admission: EM | Disposition: A | Discharge status: Home Health | Payer: Self-pay | Source: Home / Self Care | Attending: Family Medicine

## 2017-10-31 ENCOUNTER — Encounter (HOSPITAL_COMMUNITY): Payer: Self-pay | Admitting: Anesthesiology

## 2017-10-31 ENCOUNTER — Inpatient Hospital Stay (HOSPITAL_COMMUNITY): Payer: Medicare Other | Admitting: Anesthesiology

## 2017-10-31 DIAGNOSIS — I482 Chronic atrial fibrillation: Secondary | ICD-10-CM

## 2017-10-31 DIAGNOSIS — D62 Acute posthemorrhagic anemia: Secondary | ICD-10-CM

## 2017-10-31 DIAGNOSIS — K269 Duodenal ulcer, unspecified as acute or chronic, without hemorrhage or perforation: Secondary | ICD-10-CM

## 2017-10-31 DIAGNOSIS — K449 Diaphragmatic hernia without obstruction or gangrene: Secondary | ICD-10-CM

## 2017-10-31 DIAGNOSIS — D631 Anemia in chronic kidney disease: Secondary | ICD-10-CM

## 2017-10-31 DIAGNOSIS — N186 End stage renal disease: Secondary | ICD-10-CM

## 2017-10-31 DIAGNOSIS — K3189 Other diseases of stomach and duodenum: Secondary | ICD-10-CM

## 2017-10-31 DIAGNOSIS — Z992 Dependence on renal dialysis: Secondary | ICD-10-CM

## 2017-10-31 HISTORY — PX: ESOPHAGOGASTRODUODENOSCOPY (EGD) WITH PROPOFOL: SHX5813

## 2017-10-31 LAB — CBC
HCT: 28.8 % — ABNORMAL LOW (ref 36.0–46.0)
HEMOGLOBIN: 8.9 g/dL — AB (ref 12.0–15.0)
MCH: 30.3 pg (ref 26.0–34.0)
MCHC: 30.9 g/dL (ref 30.0–36.0)
MCV: 98 fL (ref 78.0–100.0)
Platelets: 153 10*3/uL (ref 150–400)
RBC: 2.94 MIL/uL — AB (ref 3.87–5.11)
RDW: 18.3 % — ABNORMAL HIGH (ref 11.5–15.5)
WBC: 6 10*3/uL (ref 4.0–10.5)

## 2017-10-31 LAB — RENAL FUNCTION PANEL
ALBUMIN: 2.9 g/dL — AB (ref 3.5–5.0)
ANION GAP: 7 (ref 5–15)
BUN: 26 mg/dL — ABNORMAL HIGH (ref 8–23)
CALCIUM: 8.4 mg/dL — AB (ref 8.9–10.3)
CO2: 30 mmol/L (ref 22–32)
Chloride: 99 mmol/L (ref 98–111)
Creatinine, Ser: 2.95 mg/dL — ABNORMAL HIGH (ref 0.44–1.00)
GFR calc non Af Amer: 14 mL/min — ABNORMAL LOW (ref >60)
GFR, EST AFRICAN AMERICAN: 16 mL/min — AB (ref >60)
Glucose, Bld: 111 mg/dL — ABNORMAL HIGH (ref 70–99)
PHOSPHORUS: 3.6 mg/dL (ref 2.5–4.6)
Potassium: 3.8 mmol/L (ref 3.5–5.1)
SODIUM: 136 mmol/L (ref 135–145)

## 2017-10-31 LAB — GLUCOSE, CAPILLARY
Glucose-Capillary: 118 mg/dL — ABNORMAL HIGH (ref 70–99)
Glucose-Capillary: 88 mg/dL (ref 70–99)

## 2017-10-31 SURGERY — ESOPHAGOGASTRODUODENOSCOPY (EGD) WITH PROPOFOL
Anesthesia: Monitor Anesthesia Care

## 2017-10-31 MED ORDER — SODIUM CHLORIDE 0.9 % IV SOLN
INTRAVENOUS | Status: DC
  Administered 2017-10-31: 11:00:00 via INTRAVENOUS

## 2017-10-31 MED ORDER — LACTATED RINGERS IV SOLN: INTRAVENOUS | Status: DC

## 2017-10-31 MED ORDER — PROPOFOL 10 MG/ML IV BOLUS
INTRAVENOUS | Status: DC | PRN
  Administered 2017-10-31: 40 mg via INTRAVENOUS
  Administered 2017-10-31: 20 mg via INTRAVENOUS
  Administered 2017-10-31: 40 mg via INTRAVENOUS

## 2017-10-31 MED ORDER — STERILE WATER FOR IRRIGATION IR SOLN
Status: DC | PRN
  Administered 2017-10-31: 100 mL

## 2017-10-31 MED ORDER — PANTOPRAZOLE SODIUM 40 MG PO TBEC: 40.0000 mg | DELAYED_RELEASE_TABLET | Freq: Every day | ORAL | 2 refills | Status: DC

## 2017-10-31 MED ORDER — SODIUM CHLORIDE 0.9 % IV SOLN: INTRAVENOUS | Status: DC

## 2017-10-31 MED ORDER — PANTOPRAZOLE SODIUM 40 MG PO TBEC: 40.0000 mg | DELAYED_RELEASE_TABLET | Freq: Every day | ORAL | Status: DC

## 2017-10-31 MED ORDER — PROPOFOL 500 MG/50ML IV EMUL
INTRAVENOUS | Status: DC | PRN
  Administered 2017-10-31: 125 ug/kg/min via INTRAVENOUS

## 2017-10-31 NOTE — Anesthesia Procedure Notes (Signed)
Procedure Name: MAC Date/Time: 10/31/2017 11:12 AM Performed by: Vista Deck, CRNA Pre-anesthesia Checklist: Patient identified, Emergency Drugs available, Suction available, Timeout performed and Patient being monitored Patient Re-evaluated:Patient Re-evaluated prior to induction Oxygen Delivery Method: Nasal Cannula

## 2017-10-31 NOTE — Care Management Note (Signed)
Case Management Note  Patient Details  [name] MRN: [id number] Date of Birth: 29-Oct-1940  Subjective/Objective:  Chronic blood loss. From home, has CAP aide - M, W, F 9-1. Goes to [address] in [place], niece takes on Tuesday, nephew takes on Thursday, cousin takes her on Saturdays.  Walks with RW. \ Recommended for HH PT. Agreeable. No preference.                  Action/Plan:  Dc home with HH. Will give referral to [facility].   Expected Discharge Date:    10/31/17              Expected Discharge Plan:  [place]  In-House Referral:     Discharge planning Services  CM Consult  Post Acute Care Choice:  Home Health Choice offered to:  Patient  DME Arranged:    DME Agency:     HH Arranged:  PT [place]:  [facility]  Status of Service:  Completed, signed off  If discussed at [place] of Stay Meetings, dates discussed:    Additional Comments:  Tiffiany Beadles, Chauncey [place], RN 10/31/2017, 2:17 PM

## 2017-10-31 NOTE — Transfer of Care (Signed)
Immediate Anesthesia Transfer of Care Note  Patient: Jody [name]  Procedure(s) Performed: ESOPHAGOGASTRODUODENOSCOPY (EGD) WITH PROPOFOL (N/A )  Patient Location: PACU  Anesthesia Type:MAC  Level of Consciousness: awake, alert  and patient cooperative  Airway & Oxygen Therapy: Patient Spontanous Breathing  Post-op Assessment: Report given to RN and Post -op Vital signs reviewed and stable  Post vital signs: Reviewed and stable  Last Vitals:  Vitals Value Taken Time  BP    Temp    Pulse 57 10/31/2017 11:37 AM  Resp 14 10/31/2017 11:37 AM  SpO2 91 % 10/31/2017 11:37 AM  Vitals shown include unvalidated device data.  Last Pain:  Vitals:   10/31/17 1117  TempSrc:   PainSc: 0-No pain      Patients Stated Pain Goal: 0 (81/19/14 7829)  Complications: No apparent anesthesia complications

## 2017-10-31 NOTE — Progress Notes (Signed)
Jody [name]  MRN: [id number]  DOB/AGE: 79-Dec-1942 79 y.o.  Primary Care Physician:Shah, Weldon Picking, MD  Admit date: 10/26/2017  Chief Complaint:  Chief Complaint  Patient presents with  . Abnormal Lab    S-Pt presented on  10/26/2017 with  Chief Complaint  Patient presents with  . Abnormal Lab  .    Pt main concern is " I am hungry waiting for them to take me downstairs"   Meds . sodium chloride   Intravenous Once  . amLODipine  10 mg Oral Daily  . atorvastatin  40 mg Oral QHS  . Chlorhexidine Gluconate Cloth  6 each Topical Q0600  . diltiazem  180 mg Oral Daily  . docusate sodium  100 mg Oral BID  . epoetin (EPOGEN/PROCRIT) injection  6,000 Units Subcutaneous Q T,Th,Sa-HD  . gabapentin  300 mg Oral QHS  . hydrALAZINE  100 mg Oral Q8H  . insulin aspart  0-9 Units Subcutaneous TID WC  . metoprolol tartrate  12.5 mg Oral BID  . mometasone-formoterol  2 puff Inhalation BID  . multivitamin with minerals  1 tablet Oral Daily  . pantoprazole  40 mg Oral BID  . sodium bicarbonate  650 mg Oral BID       Physical Exam: Vital signs in last 24 hours: Temp:  [97.4 F (36.3 C)-98.6 F (37 C)] 98.6 F (37 C) (09/18 6222) Pulse Rate:  [50-62] 61 (09/18 0635) Resp:  [16-18] 18 (09/17 1954) BP: (127-179)/(60-78) 179/78 (09/18 0635) SpO2:  [92 %-100 %] 99 % (09/18 0803) Weight:  [98.1 kg] 98.1 kg (09/17 1605) Weight change:  Last BM Date: 10/28/17  Intake/Output from previous day: 09/17 0701 - 09/18 0700 In: 0  Out: 3250 [Urine:750] No intake/output data recorded.   Physical Exam: General- pt is awake,alert, oriented to time place and person Resp- No acute REsp distress, CTA B/L NO Rhonchi CVS- S1S2 regular in rate and rhythm GIT- BS+, soft, NT, ND EXT- NO LE Edema, Cyanosis Access=Right AVF + Righ sided PC   Lab Results: CBC Recent Labs    10/30/17 0431 10/31/17 0435  WBC 6.2 6.0  HGB 9.3* 8.9*  HCT 30.0* 28.8*  PLT 175 153    BMET Recent Labs   10/30/17 0431 10/31/17 0435  NA 136 136  K 4.4 3.8  CL 99 99  CO2 28 30  GLUCOSE 101* 111*  BUN 44* 26*  CREATININE 4.88* 2.95*  CALCIUM 8.8* 8.4*    MICRO No results found for this or any previous visit (from the past 240 hour(s)).    Lab Results  Component Value Date   CALCIUM 8.4 (L) 10/31/2017   CAION 1.18 05/27/2014   PHOS 3.6 10/31/2017   Alb 2.9 Corrected calcium 8.4+0.9=9.3       Impression: 1)Renal  ESRD on HD                Pt is on Tuesday/Thursday/Saturday schedule                Pt was last dialyzed yesterday  2)HTN  Medication- On Calcium Channel Blockers On Beta blockers On Vasodilators  3)Anemia IN ESRD the goal for hgb is 9-11 on epo  HGb is not at goal  Admitted with GI bleed  4)CKD Mineral-Bone Disorder Phosphorus at goal. Calcium when corrected for albumin is at goal.  5)CHF- hx of Diastolic CHF Primary MD following  6)Electrolytes Normokalemic NOrmonatremic   7)Acid base Co2 at goal   8) GI-admiotted with GI bleed Colonoscope  done EGD to be done today   Plan:   No need for HD. Will dialyze am Will continue current care    Troy S 10/31/2017, 9:03 AM

## 2017-10-31 NOTE — Care Management Important Message (Signed)
Important Message  Patient Details  [name] MRN: [id number] Date of Birth: 1940/09/19   Medicare Important Message Given:  Yes    Shelda Altes 10/31/2017, 12:05 PM

## 2017-10-31 NOTE — Progress Notes (Signed)
Brief EGD note:  Small sliding hiatal hernia. 3 antral scars indicative of healed ulcers. Erosive antral gastritis. Erosive bulbar duodenitis. No stigmata of GI bleed.
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
## 2017-10-31 NOTE — Anesthesia Preprocedure Evaluation (Addendum)
Anesthesia Evaluation  Patient identified by MRN, date of birth, ID band Patient awake    Reviewed: Allergy & Precautions, H&P , NPO status , Patient's Chart, lab work & pertinent test results  History of Anesthesia Complications (+) history of anesthetic complications  Airway Mallampati: II  TM Distance: >3 FB Neck ROM: Full    Dental  (+) Edentulous Upper, Edentulous Lower, Dental Advisory Given   Pulmonary COPD,  COPD inhaler, former smoker,    breath sounds clear to auscultation       Cardiovascular hypertension, Pt. on medications and Pt. on home beta blockers + Past MI and +CHF  + dysrhythmias Atrial Fibrillation  Rhythm:Regular Rate:Normal  Echo 9/18 Impressions:  - Mild LVH with LVEF 60-65% and grade 2 diastolic dysfunction.   Moderate left atrial enlargement. Mild calcified mitral annulus   with trivial mitral regurgitation. Moderately sclerotic aortic   valve without stenosis. Trivial aortic regurgitation. Trivial   tricuspid regurgitation.  EKG= SB    Neuro/Psych TIACVA, Residual Symptoms    GI/Hepatic   Endo/Other  diabetes, Type 2  Renal/GU ESRF and DialysisRenal disease     Musculoskeletal  (+) Arthritis , Osteoarthritis,    Abdominal   Peds  Hematology  (+) anemia ,   Anesthesia Other Findings   Reproductive/Obstetrics                             Anesthesia Physical  Anesthesia Plan  ASA: IV  Anesthesia Plan: MAC   Post-op Pain Management:    Induction: Intravenous  PONV Risk Score and Plan:   Airway Management Planned:   Additional Equipment:   Intra-op Plan:   Post-operative Plan:   Informed Consent: I have reviewed the patients History and Physical, chart, labs and discussed the procedure including the risks, benefits and alternatives for the proposed anesthesia with the patient or authorized representative who has indicated his/her understanding  and acceptance.   Dental Advisory Given  Plan Discussed with: CRNA, Anesthesiologist and Surgeon  Anesthesia Plan Comments: ( )       Anesthesia Quick Evaluation

## 2017-10-31 NOTE — Op Note (Signed)
[facility] Patient [name] Procedure Date: 10/31/2017 10:59 AM MRN: [id number] Date of Birth: [birth year] Attending MD: Hildred Laser , MD CSN: [id number] Age: 77 Admit Type: Inpatient Procedure:                Upper GI endoscopy Indications:              Acute post hemorrhagic anemia Providers:                Hildred Laser, MD, Rosina Lowenstein, RN, Randa Spike, Technician Referring MD:             Irwin Brakeman, MD Medicines:                Propofol per Anesthesia Complications:            No immediate complications. Estimated Blood Loss:     Estimated blood loss: none. Procedure:                Pre-Anesthesia Assessment:                           - Prior to the procedure, a History and Physical                            was performed, and patient medications and                            allergies were reviewed. The patient's tolerance of                            previous anesthesia was also reviewed. The risks                            and benefits of the procedure and the sedation                            options and risks were discussed with the patient.                            All questions were answered, and informed consent                            was obtained. Prior Anticoagulants: The patient                            last took aspirin 5 days, Eliquis (apixaban) 5 days                            and Pletal (cilostazol) 5 days prior to the                            procedure. ASA Grade Assessment: IV - A patient  with severe systemic disease that is a constant                            threat to life. After reviewing the risks and                            benefits, the patient was deemed in satisfactory                            condition to undergo the procedure.                           After obtaining informed consent, the endoscope was                            passed under  direct vision. Throughout the                            procedure, the patient's blood pressure, pulse, and                            oxygen saturations were monitored continuously. The                            [id number]-H190 ([id number]) scope was introduced through the                            and advanced to the third part of duodenum. The                            upper GI endoscopy was accomplished without                            difficulty. The patient tolerated the procedure                            well. Scope In: 11:20:37 AM Scope Out: 11:27:00 AM Total Procedure Duration: 0 hours 6 minutes 23 seconds  Findings:      The examined esophagus was normal.      The Z-line was regular and was found 35 cm from the incisors.      A 3 cm hiatal hernia was present.      Multiple, non-bleeding erosions were found in the gastric antrum and in       the prepyloric region of the stomach. There were no stigmata of recent       bleeding.      Three healed ulcers was found in the gastric antrum.      The exam of the stomach was otherwise normal.      A few erosions without bleeding were found in the duodenal bulb.      The second portion of the duodenum and third portion of the duodenum       were normal. Impression:               - Normal esophagus.                           -  Z-line regular, 35 cm from the incisors.                           - 3 cm hiatal hernia.                           - Non-bleeding erosive gastropathy.                           - Three scars in the gastric antrum indicative of                            healed ulcers.                           - Duodenal erosions without bleeding.                           - Normal second portion of the duodenum and third                            portion of the duodenum.                           - No specimens collected. Moderate Sedation:      Per Anesthesia Care Recommendation:           - Return patient to hospital ward for  ongoing care.                           - Diabetic (ADA) diet today.                           - Continue present medications.                           - Decrease PPI to qd.                           - H. pylori serology.                           - Re-evaluate antiplatet and anticoagulant therapy.                           Comment: If blleding recurs will consider further                            workup. Procedure Code(s):        --- Professional ---                           [phone], Esophagogastroduodenoscopy, flexible,                            transoral; diagnostic, including collection of  specimen(s) by brushing or washing, when performed                            (separate procedure) Diagnosis Code(s):        --- Professional ---                           K44.9, Diaphragmatic hernia without obstruction or                            gangrene                           K31.89, Other diseases of stomach and duodenum                           K26.9, Duodenal ulcer, unspecified as acute or                            chronic, without hemorrhage or perforation                           D62, Acute posthemorrhagic anemia CPT copyright 2017 American Medical Association. All rights reserved. The codes documented in this report are preliminary and upon coder review may  be revised to meet current compliance requirements. Hildred Laser, MD Hildred Laser, MD 10/31/2017 11:40:10 AM This report has been signed electronically. Number of Addenda: 0

## 2017-10-31 NOTE — Anesthesia Postprocedure Evaluation (Signed)
Anesthesia Post Note  Patient: Jody [name]  Procedure(s) Performed: ESOPHAGOGASTRODUODENOSCOPY (EGD) WITH PROPOFOL (N/A )  Patient location during evaluation: PACU Anesthesia Type: MAC Level of consciousness: awake and alert and patient cooperative Pain management: satisfactory to patient Vital Signs Assessment: post-procedure vital signs reviewed and stable Respiratory status: spontaneous breathing Cardiovascular status: stable Postop Assessment: no apparent nausea or vomiting Anesthetic complications: no     Last Vitals:  Vitals:   10/31/17 1136 10/31/17 1206  BP:  (!) 166/54  Pulse: (!) 58 63  Resp:  14  Temp: 36.8 C 36.8 C  SpO2: 90% 94%    Last Pain:  Vitals:   10/31/17 1136  TempSrc:   PainSc: 0-No pain                 Sorin Frimpong

## 2017-10-31 NOTE — Progress Notes (Signed)
Patient discharged home with personal belongings. IV removed and site intact. Patient discharged with prescription and AVS. Verbalizes understanding.
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
## 2017-11-01 ENCOUNTER — Encounter (HOSPITAL_COMMUNITY): Payer: Self-pay | Admitting: Internal Medicine

## 2017-11-01 LAB — H. PYLORI ANTIBODY, IGG: H PYLORI IGG: 0.5 {index_val} (ref 0.00–0.79)

## 2018-03-25 ENCOUNTER — Other Ambulatory Visit: Payer: Self-pay

## 2018-03-25 ENCOUNTER — Ambulatory Visit (INDEPENDENT_AMBULATORY_CARE_PROVIDER_SITE_OTHER): Payer: Medicare Other | Admitting: Physician Assistant

## 2018-03-25 ENCOUNTER — Encounter: Payer: Self-pay | Admitting: Physician Assistant

## 2018-03-25 VITALS — BP 158/75 | HR 55 | Temp 97.2°F | Resp 18 | Ht 67.0 in | Wt 216.0 lb

## 2018-03-25 DIAGNOSIS — Z992 Dependence on renal dialysis: Secondary | ICD-10-CM

## 2018-03-25 DIAGNOSIS — N186 End stage renal disease: Secondary | ICD-10-CM | POA: Diagnosis not present

## 2018-03-25 NOTE — Progress Notes (Signed)
Established Dialysis Access   History of Present Illness   Jody [name] is a 78 y.o. ([birth year]) female who presents for re-evaluation of permanent access.  She is s/p right second stage basilic vein transposition by Dr. Donnetta Hutching 09/17/2017.  She returns to clinic due to dialysis technicians pulling clots from access.  She states this only happened two times a few weeks ago however she has been continuing hemodialysis via R arm AVF without problems.  She does not want any surgery or anything else done to her fistula because she believes it is now working properly.  She denies signs or symptoms of steal syndrome R hand.    Current Outpatient Medications  Medication Sig Dispense Refill  . albuterol (PROVENTIL HFA;VENTOLIN HFA) 108 (90 BASE) MCG/ACT inhaler Inhale 1-2 puffs into the lungs every 6 (six) hours as needed for wheezing or shortness of breath. Reported on 03/01/2015    . amLODipine (NORVASC) 10 MG tablet Take 1 tablet (10 mg total) by mouth daily. 30 tablet 1  . atorvastatin (LIPITOR) 40 MG tablet Take 1 tablet by mouth at bedtime.   0  . cilostazol (PLETAL) 50 MG tablet Take 50 mg by mouth 2 (two) times daily.  0  . diltiazem (DILACOR XR) 180 MG 24 hr capsule Take 180 mg by mouth daily.    [name] ELIQUIS 2.5 MG TABS tablet Take 1 tablet (2.5 mg total) by mouth 2 (two) times daily. 60 tablet 3  . fluticasone-salmeterol (ADVAIR HFA) 230-21 MCG/ACT inhaler Inhale 2 puffs into the lungs 2 (two) times daily.    [name] gabapentin (NEURONTIN) 600 MG tablet Take 600 mg by mouth 3 (three) times daily.    . hydrALAZINE (APRESOLINE) 100 MG tablet TAKE ONE (1) TABLET THREE (3) TIMES EACH DAY 270 tablet 0  . metoprolol tartrate (LOPRESSOR) 25 MG tablet Take 0.5 tablets (12.5 mg total) by mouth 2 (two) times daily. 30 tablet 0  . Multiple Vitamin (MULTIVITAMIN WITH MINERALS) TABS tablet Take 1 tablet by mouth daily.    . pantoprazole (PROTONIX) 40 MG tablet Take 1 tablet (40 mg total) by mouth daily. 30  tablet 2  . polyethylene glycol (MIRALAX / GLYCOLAX) packet Take 17 g by mouth daily as needed (constipation).    . propranolol (INDERAL) 10 MG tablet Take 10 mg by mouth 2 (two) times daily.    . sodium bicarbonate 650 MG tablet Take 1 tablet (650 mg total) by mouth 2 (two) times daily. 60 tablet 1   No current facility-administered medications for this visit.      Physical Examination   Vitals:   03/25/18 1305  BP: (!) 158/75  Pulse: (!) 55  Resp: 18  Temp: (!) 97.2 F (36.2 C)  SpO2: 94%  Weight: 216 lb (98 kg)  Height: 5\' 7"  (1.702 m)   Body mass index is 33.83 kg/m.  General Alert, O x 3, WD, NAD  Pulmonary Sym exp, good B air movt  Cardiac RRR, Nl S1, S2,   Vascular Vessel Right Left  Radial Palpable Palpable  Brachial Palpable Palpable  Ulnar Not palpable Not palpable    Musculo- skeletal  right arm AV fistula with palpable thrill throughout right upper arm, audible bruit throughout, no palpable area of significant stenosis, no palpable large competing branches  Neurologic A&O; CN grossly intact     Medical Decision Making   Jody [name] is a 78 y.o. female who presents with ESRD requiring hemodialysis.   Patient returns to office to recheck  AV fistula due to "pulling clots from access" during dialysis.  Patient states this only occurred twice and this was several weeks ago and since that time her fistula has been working properly.  I do not have any new imaging of her right arm AV fistula.  The patient is also against having any procedures done unless they are absolutely indicated.  On physical exam right arm fistula has a easily palpable thrill throughout the length of her upper arm.  Without imaging, I do not see a clear indication to proceed with fistulogram.  I would recommend performing a fistula duplex in the event that she were to have trouble with her fistula in the future.  As for now she will return to office on an as-needed basis.   Dagoberto Ligas  PA-C [facility] Office: [phone]  Clinic MD: Dr. Trula Slade

## 2018-04-11 ENCOUNTER — Encounter: Payer: Self-pay | Admitting: Physician Assistant

## 2018-04-11 NOTE — Progress Notes (Signed)
Cardiology Office Note    Date:  04/12/2018  ID:  Rhythm Gubbels, DOB September 24, 1940, MRN [id number] PCP:  Monico Blitz, MD  Cardiologist:  Kate Sable, MD  Chief Complaint: 6 month f/u afib, CHF, NSVT  History of Present Illness:  Jody [name] is a 78 y.o. female with history of paroxysmal atrial fibrillation, sinus bradycardia, chronic diastolic CHF, NSVT, HTN, anemia due to CKD, ESRD on HD, COPD, bad arthritis, HLD, stroke, DM who presents for 6 month follow-up. Her afib was diagnosed in 2016 and appears to have been paroxysmal since then. She is on Eliquis. Last EKG in 10/2016 showed NSR. Last echo 10/2016 EF 60-65%, mild LVH, grade 2 DD, mod LAE. She was last admitted 10/2017 for ABL anemia on top of AOCD, acute component felt due to some GI loss superimposed on CKD. She required blood transfusion. EGD showed small sliding hiatal hernia, 3 antral scars indicative of healed ulcers, erosive antral gastritis, erosive bulbar duodenitis, no stigmata of GI bleed. Aspirin was discontinued and she was allowed to continue Eliquis. She also carries a history of NSVT in 2016. Beta blockers have been traditionally avoided due to COPD. She previously had nuclear stress testing on 12/10/14 which showed a medium-sized defect of moderate severity in the inferior wall and the basal inferoseptal wall. There was significant diaphragmatic attenuation and extra cardiac uptake, but ischemia could not entirely be ruled out. Regional wall motion was normal so the interpretation favored artifact which Dr. Bronson Ing agreed with. Last labs in 10/2017 showed Hgb 8.0, K 4.8, Cr 4.82. We do not follow her lipids.  She returns for follow-up doing well from a heart standpoint. She reports chronic unchanged DOE but is very limited in activity due to arthritis and neuropathy. She has lower leg pain with exertion but also at night as well. No CP, palpitations, bleeding, melena, orthopnea. She has chronic intermittent RLE edema  ever since having knee surgery in the past. She reports Dr. Manuella Ghazi took her off Eliquis a few months ago but is not sure why. She is now on Pletal which she states he trialed for leg pain but it isn't really helping. Propranolol was randomly on her med list given to me but she states she is no longer taking this, nor gabapentin as it gave her gas. She does remain on metoprolol.   Past Medical History:  Diagnosis Date  . Anemia in chronic kidney disease (CODE) 06/05/2015  . Arthritis    "bad in my legs" (12/07/2014)  . Chronic back pain    "dr said my spine is crooked"  . Chronic bronchitis ([place])    "get it q yr" (12/07/2014)  . CKD stage 4 due to type 2 diabetes mellitus ([place]) 06/05/2015  . Complication of anesthesia    headache for 2 days after surgery in May 2019  . COPD (chronic obstructive pulmonary disease) ([place])   . Gait difficulty 09/02/2014  . Hypercholesterolemia   . Hypertension   . Neuropathy 2015   in legs  . NSVT (nonsustained ventricular tachycardia) ([place])   . PAF (paroxysmal atrial fibrillation) ([place]) 06/05/2015  . Sinus brady-tachy syndrome ([place])   . Stroke ([place]) 05/2014   denies residual on 12/07/2014  . Type II diabetes mellitus ([place])     Past Surgical History:  Procedure Laterality Date  . ABDOMINAL HYSTERECTOMY  ~ 1970  . APPENDECTOMY  ~ 1970  . AV FISTULA PLACEMENT Right 07/06/2017   Procedure: RIGHT RADIOCEPHALIC ARTERIOVENOUS FISTULA creation;  Surgeon: Conrad Dothan,  MD;  Location: [facility];  Service: Vascular;  Laterality: Right;  . BASCILIC VEIN TRANSPOSITION Right 09/17/2017   Procedure: SECOND STAGE BASILIC VEIN TRANSPOSITION RIGHT ARM;  Surgeon: Rosetta Posner, MD;  Location: [facility];  Service: Vascular;  Laterality: Right;  . COLONOSCOPY N/A 10/28/2017   Procedure: COLONOSCOPY;  Surgeon: Rogene Houston, MD;  [facility];  Service: Endoscopy;  Laterality: N/A;  . ESOPHAGOGASTRODUODENOSCOPY (EGD) WITH PROPOFOL N/A 10/31/2017   Procedure:  ESOPHAGOGASTRODUODENOSCOPY (EGD) WITH PROPOFOL;  Surgeon: Rogene Houston, MD;  [facility];  Service: Endoscopy;  Laterality: N/A;  . JOINT REPLACEMENT    . TOTAL KNEE ARTHROPLASTY Right ~ 1986  . TUMOR EXCISION  ~ 1970   "9# in my stomach"    Current Medications: Current Meds  Medication Sig  . albuterol (PROVENTIL HFA;VENTOLIN HFA) 108 (90 BASE) MCG/ACT inhaler Inhale 1-2 puffs into the lungs every 6 (six) hours as needed for wheezing or shortness of breath. Reported on 03/01/2015  . amLODipine (NORVASC) 10 MG tablet Take 1 tablet (10 mg total) by mouth daily.  [name] atorvastatin (LIPITOR) 40 MG tablet Take 1 tablet by mouth at bedtime.   . cilostazol (PLETAL) 50 MG tablet Take 50 mg by mouth 2 (two) times daily.  [name] diltiazem (DILACOR XR) 180 MG 24 hr capsule Take 180 mg by mouth daily.  . fluticasone-salmeterol (ADVAIR HFA) 230-21 MCG/ACT inhaler Inhale 2 puffs into the lungs 2 (two) times daily.  . hydrALAZINE (APRESOLINE) 100 MG tablet TAKE ONE (1) TABLET THREE (3) TIMES EACH DAY  . metoprolol tartrate (LOPRESSOR) 25 MG tablet Take 0.5 tablets (12.5 mg total) by mouth 2 (two) times daily.  . pantoprazole (PROTONIX) 40 MG tablet Take 1 tablet (40 mg total) by mouth daily.  . polyethylene glycol (MIRALAX / GLYCOLAX) packet Take 17 g by mouth daily as needed (constipation).  . sodium bicarbonate 650 MG tablet Take 1 tablet (650 mg total) by mouth 2 (two) times daily.     Allergies:   Codeine   Social History   Socioeconomic History  . Marital status: Widowed    Spouse name: Not on file  . Number of children: 0  . Years of education: 9  . Highest education level: Not on file  Occupational History  . Occupation: retired  Scientific laboratory technician  . Financial resource strain: Not on file  . Food insecurity:    Worry: Not on file    Inability: Not on file  . Transportation needs:    Medical: Not on file    Non-medical: Not on file  Tobacco Use  . Smoking status: Former Smoker      Packs/day: 0.50    Years: 50.00    Pack years: 25.00    Types: Cigarettes    Start date: 03/07/1953    Last attempt to quit: 01/14/2004    Years since quitting: 14.2  . Smokeless tobacco: Never Used  . Tobacco comment: "quit smoking cigarettes  in ~ 2005"  Substance and Sexual Activity  . Alcohol use: No    Alcohol/week: 0.0 standard drinks    Comment: 10/242016 "quit drinking beer in ~ 1977"  . Drug use: No  . Sexual activity: Never  Lifestyle  . Physical activity:    Days per week: Not on file    Minutes per session: Not on file  . Stress: Not on file  Relationships  . Social connections:    Talks on phone: Not on file  Gets together: Not on file    Attends religious service: Not on file    Active member of club or organization: Not on file    Attends meetings of clubs or organizations: Not on file    Relationship status: Not on file  Other Topics Concern  . Not on file  Social History Narrative   Patient drinks caffeine a few times a week.   Patient is right handed.     Family History:  The patient's family history includes Alcohol abuse in her brother; Cancer in her sister and another family member; Heart attack in her brother.  ROS:   Please see the history of present illness.  All other systems are reviewed and otherwise negative.    PHYSICAL EXAM:   VS:  BP 136/80   Pulse 65   Ht 5\' 7"  (1.702 m)   Wt 203 lb (92.1 kg)   SpO2 97%   BMI 31.79 kg/m   BMI: Body mass index is 31.79 kg/m. GEN: Well nourished, well developed cheerful AAF in no acute distress HEENT: normocephalic, atraumatic Neck: no JVD, carotid bruits, or masses Cardiac: RRR; no murmurs, rubs, or gallops, trace R ankle edema. Diminished pedal pulses bilaterally. No nonhealing wounds noted Respiratory:  clear to auscultation bilaterally, normal work of breathing GI: soft, nontender, nondistended, + BS MS: no deformity or atrophy Skin: warm and dry, no rash Neuro:  Alert and Oriented x 3,  Strength and sensation are intact, follows commands Psych: euthymic mood, full affect  Wt Readings from Last 3 Encounters:  04/12/18 203 lb (92.1 kg)  03/25/18 216 lb (98 kg)  10/30/17 216 lb 4.3 oz (98.1 kg)      Studies/Labs Reviewed:   EKG:  EKG was ordered today and personally reviewed by me and demonstrates baseline tremor but appears to show sinus bradycardia 59bpm with nonspecific STT changes  Recent Labs: 10/27/2017: ALT 18; Magnesium 2.7 10/31/2017: BUN 26; Creatinine, Ser 2.95; Hemoglobin 8.9; Platelets 153; Potassium 3.8; Sodium 136   Lipid Panel    Component Value Date/Time   CHOL 156 10/04/2015 0301   TRIG 51 10/04/2015 0301   HDL 54 10/04/2015 0301   CHOLHDL 2.9 10/04/2015 0301   VLDL 10 10/04/2015 0301   LDLCALC 92 10/04/2015 0301    Additional studies/ records that were reviewed today include: Summarized above.    ASSESSMENT & PLAN:   1. Paroxysmal atrial fibrillation - maintaining NSR. EKG with artifact but lead III clearly shows sinus bradycardia. Check CBC today. Placed a phone note asking nurse to call Dr. Trena Platt office to clarify if/why Eliquis was truly stopped. May be a decision that came to light from anemia and recent adimssion 10/2017 as above, although hospital notes indicate she was cleared to restart at that time. I instructed her to bring all her bottles to all f/u appts. 2. Chronic diastolic CHF - appears euvolemic. Volume managed by HD TTS. 3. H/o NSVT - quiescent. Continue BB as tolerated. 4. Sinus bradycardia - stable. She has clarified she is not on propranolol, only metoprolol. 5. Bilateral leg pain - will obtain LE vascular testing to exclude PAD. I suspect multifactorial but pulses are diminished. No non-healing wounds noted. If these are normal, anticipate recommendation to d/c Pletal.  Disposition: F/u with Dr. Bronson Ing in 6 months.  Medication Adjustments/Labs and Tests Ordered: Current medicines are reviewed at length with the  patient today.  Concerns regarding medicines are outlined above. Medication changes, Labs and Tests ordered today are summarized above  and listed in the Patient Instructions accessible in Encounters.   Signed, Charlie Pitter, PA-C  04/12/2018 2:19 PM    [facility] in [place], [place] [address] Ph: [phone]; Fax [phone]

## 2018-04-12 ENCOUNTER — Encounter: Payer: Self-pay | Admitting: Physician Assistant

## 2018-04-12 ENCOUNTER — Ambulatory Visit (INDEPENDENT_AMBULATORY_CARE_PROVIDER_SITE_OTHER): Payer: Medicare Other | Admitting: Physician Assistant

## 2018-04-12 ENCOUNTER — Other Ambulatory Visit (HOSPITAL_COMMUNITY)
Admission: RE | Admit: 2018-04-12 | Discharge: 2018-04-12 | Disposition: A | Discharge status: Home / Self Care | Payer: Medicare Other | Source: Ambulatory Visit | Attending: Physician Assistant | Admitting: Physician Assistant

## 2018-04-12 ENCOUNTER — Telehealth: Payer: Self-pay | Admitting: Physician Assistant

## 2018-04-12 VITALS — BP 136/80 | HR 65 | Ht 67.0 in | Wt 203.0 lb

## 2018-04-12 DIAGNOSIS — I472 Ventricular tachycardia: Secondary | ICD-10-CM

## 2018-04-12 DIAGNOSIS — I480 Paroxysmal atrial fibrillation: Secondary | ICD-10-CM | POA: Diagnosis present

## 2018-04-12 DIAGNOSIS — R001 Bradycardia, unspecified: Secondary | ICD-10-CM

## 2018-04-12 DIAGNOSIS — I4729 Other ventricular tachycardia: Secondary | ICD-10-CM

## 2018-04-12 DIAGNOSIS — Z79899 Other long term (current) drug therapy: Secondary | ICD-10-CM

## 2018-04-12 DIAGNOSIS — M79605 Pain in left leg: Secondary | ICD-10-CM

## 2018-04-12 DIAGNOSIS — I5032 Chronic diastolic (congestive) heart failure: Secondary | ICD-10-CM

## 2018-04-12 DIAGNOSIS — M79604 Pain in right leg: Secondary | ICD-10-CM

## 2018-04-12 LAB — CBC WITH DIFFERENTIAL/PLATELET
Abs Immature Granulocytes: 0.03 10*3/uL (ref 0.00–0.07)
Basophils Absolute: 0 10*3/uL (ref 0.0–0.1)
Basophils Relative: 0 %
Eosinophils Absolute: 0.4 10*3/uL (ref 0.0–0.5)
Eosinophils Relative: 6 %
HEMATOCRIT: 33.2 % — AB (ref 36.0–46.0)
Hemoglobin: 10 g/dL — ABNORMAL LOW (ref 12.0–15.0)
Immature Granulocytes: 0 %
Lymphocytes Relative: 21 %
Lymphs Abs: 1.4 10*3/uL (ref 0.7–4.0)
MCH: 33.3 pg (ref 26.0–34.0)
MCHC: 30.1 g/dL (ref 30.0–36.0)
MCV: 110.7 fL — ABNORMAL HIGH (ref 80.0–100.0)
MONOS PCT: 9 %
Monocytes Absolute: 0.6 10*3/uL (ref 0.1–1.0)
Neutro Abs: 4.3 10*3/uL (ref 1.7–7.7)
Neutrophils Relative %: 64 %
Platelets: 175 10*3/uL (ref 150–400)
RBC: 3 MIL/uL — ABNORMAL LOW (ref 3.87–5.11)
RDW: 16.1 % — ABNORMAL HIGH (ref 11.5–15.5)
WBC: 6.7 10*3/uL (ref 4.0–10.5)
nRBC: 0 % (ref 0.0–0.2)

## 2018-04-12 NOTE — Telephone Encounter (Signed)
Pt states Dr. Manuella Ghazi took her off Eliquis after her hospitalization in 10/2017. The hospital notes say she was supposed to resume. Can you please call Dr. Trena Platt office and clarify what the reason was and when it was stopped? She does have hx of requiring transfusion so not clear if primary care made the definitive decision to stop. Thank you! Dayna Dunn PA-C

## 2018-04-12 NOTE — Addendum Note (Signed)
Addended by: Levonne Hubert on: 04/12/2018 02:44 PM   Modules accepted: Orders

## 2018-04-12 NOTE — Patient Instructions (Addendum)
Medication Instructions:  Your physician recommends that you continue on your current medications as directed. Please refer to the Current Medication list given to you today.  If you need a refill on your cardiac medications before your next appointment, please call your pharmacy.   Lab work: Your physician recommends that you return for lab work in: Today  If you have labs (blood work) drawn today and your tests are completely normal, you will receive your results only by: [name] MyChart Message (if you have MyChart) OR . A paper copy in the mail If you have any lab test that is abnormal or we need to change your treatment, we will call you to review the results.  Testing/Procedures: Your physician has requested that you have an ankle brachial index (ABI). During this test an ultrasound and blood pressure cuff are used to evaluate the arteries that supply the arms and legs with blood. Allow thirty minutes for this exam. There are no restrictions or special instructions.    Follow-Up: At [facility], you and your health needs are our priority.  As part of our continuing mission to provide you with exceptional heart care, we have created designated Provider Care Teams.  These Care Teams include your primary Cardiologist (physician) and Advanced Practice Providers (APPs -  Physician Assistants and Nurse Practitioners) who all work together to provide you with the care you need, when you need it. You will need a follow up appointment in 6 months.  Please call our office 2 months in advance to schedule this appointment.  You may see Kate Sable, MD or one of the following Advanced Practice Providers on your designated Care Team:   Bernerd Pho, PA-C [facility]) . Ermalinda Barrios, PA-C ([place])  Any Other Special Instructions Will Be Listed Below (If Applicable). Bring Medication Bottles to next visit.  Thank you for choosing [facility]!

## 2018-04-16 MED ORDER — APIXABAN 2.5 MG PO TABS: 2.5000 mg | ORAL_TABLET | Freq: Two times a day (BID) | ORAL | 11 refills | Status: DC

## 2018-04-16 NOTE — Telephone Encounter (Signed)
Jody [name] can be put back on her blood thinner per Dr. Manuella Ghazi, she was only taken for a testing procedure and she never came back for f/u for him to put her back on it per [facility] [place] office

## 2018-04-16 NOTE — Telephone Encounter (Signed)
OK! Thank you. She was on Eliquis 2.5mg  BID so please resume. She told me Pletal has not made a difference in her leg pain so let's stop, since it can increase risk of bleeding while on Eliquis. Please remind her she should not be taking aspirin (she has not been on this in recent months, just wanted to remind her).  Needs CBC in about 1-2 weeks to make sure stable with resumption of Eliquis - can try to coordinate with dialysis if we can or she can get through PCP. Dayna Dunn PA-C

## 2018-04-16 NOTE — Telephone Encounter (Signed)
Called pt. No answer. No voicemail. Will try later.

## 2018-04-19 ENCOUNTER — Ambulatory Visit (HOSPITAL_COMMUNITY)
Admission: RE | Admit: 2018-04-19 | Discharge: 2018-04-19 | Disposition: A | Discharge status: Home / Self Care | Payer: Medicare Other | Source: Ambulatory Visit | Attending: Physician Assistant | Admitting: Physician Assistant

## 2018-04-19 DIAGNOSIS — M79605 Pain in left leg: Secondary | ICD-10-CM | POA: Diagnosis present

## 2018-04-19 DIAGNOSIS — M79604 Pain in right leg: Secondary | ICD-10-CM | POA: Insufficient documentation

## 2018-04-19 NOTE — Telephone Encounter (Signed)
Pt notified and voiced understanding 

## 2018-04-23 ENCOUNTER — Telehealth: Payer: Self-pay | Admitting: *Deleted

## 2018-04-23 NOTE — Telephone Encounter (Signed)
Called patient with test results. No answer. No voicemail.

## 2018-04-23 NOTE — Telephone Encounter (Signed)
-----   Message from Charlie Pitter, [place] sent at 04/23/2018 10:52 AM EDT ----- Please let pt know ABI testing shows mild-moderate disease in the L and mild disease in the right but no evidence of severe blockage requiring further intervention. Leg pain is likely not related to this finding as would expect it to be more severe. F/u primary care for leg issues.

## 2018-08-09 ENCOUNTER — Emergency Department (HOSPITAL_COMMUNITY): Payer: Medicare Other

## 2018-08-09 ENCOUNTER — Other Ambulatory Visit: Payer: Self-pay

## 2018-08-09 ENCOUNTER — Encounter (HOSPITAL_COMMUNITY): Payer: Self-pay

## 2018-08-09 ENCOUNTER — Emergency Department (HOSPITAL_COMMUNITY)
Admission: EM | Admit: 2018-08-09 | Discharge: 2018-08-09 | Disposition: A | Discharge status: Home / Self Care | Payer: Medicare Other | Attending: Emergency Medicine | Admitting: Emergency Medicine

## 2018-08-09 DIAGNOSIS — R1031 Right lower quadrant pain: Secondary | ICD-10-CM | POA: Insufficient documentation

## 2018-08-09 DIAGNOSIS — R0789 Other chest pain: Secondary | ICD-10-CM | POA: Diagnosis not present

## 2018-08-09 DIAGNOSIS — Z992 Dependence on renal dialysis: Secondary | ICD-10-CM | POA: Diagnosis not present

## 2018-08-09 DIAGNOSIS — R05 Cough: Secondary | ICD-10-CM | POA: Diagnosis present

## 2018-08-09 DIAGNOSIS — R109 Unspecified abdominal pain: Secondary | ICD-10-CM

## 2018-08-09 DIAGNOSIS — N186 End stage renal disease: Secondary | ICD-10-CM | POA: Insufficient documentation

## 2018-08-09 DIAGNOSIS — I5032 Chronic diastolic (congestive) heart failure: Secondary | ICD-10-CM | POA: Diagnosis not present

## 2018-08-09 DIAGNOSIS — E1122 Type 2 diabetes mellitus with diabetic chronic kidney disease: Secondary | ICD-10-CM | POA: Insufficient documentation

## 2018-08-09 DIAGNOSIS — Z79899 Other long term (current) drug therapy: Secondary | ICD-10-CM | POA: Diagnosis not present

## 2018-08-09 DIAGNOSIS — I132 Hypertensive heart and chronic kidney disease with heart failure and with stage 5 chronic kidney disease, or end stage renal disease: Secondary | ICD-10-CM | POA: Diagnosis not present

## 2018-08-09 DIAGNOSIS — N281 Cyst of kidney, acquired: Secondary | ICD-10-CM

## 2018-08-09 DIAGNOSIS — Z7901 Long term (current) use of anticoagulants: Secondary | ICD-10-CM | POA: Diagnosis not present

## 2018-08-09 DIAGNOSIS — I480 Paroxysmal atrial fibrillation: Secondary | ICD-10-CM | POA: Insufficient documentation

## 2018-08-09 DIAGNOSIS — Z87891 Personal history of nicotine dependence: Secondary | ICD-10-CM | POA: Diagnosis not present

## 2018-08-09 DIAGNOSIS — J449 Chronic obstructive pulmonary disease, unspecified: Secondary | ICD-10-CM | POA: Diagnosis not present

## 2018-08-09 LAB — COMPREHENSIVE METABOLIC PANEL
ALT: 16 U/L (ref 0–44)
AST: 18 U/L (ref 15–41)
Albumin: 3.7 g/dL (ref 3.5–5.0)
Alkaline Phosphatase: 87 U/L (ref 38–126)
Anion gap: 15 (ref 5–15)
BUN: 39 mg/dL — ABNORMAL HIGH (ref 8–23)
CO2: 27 mmol/L (ref 22–32)
Calcium: 9.1 mg/dL (ref 8.9–10.3)
Chloride: 94 mmol/L — ABNORMAL LOW (ref 98–111)
Creatinine, Ser: 3.87 mg/dL — ABNORMAL HIGH (ref 0.44–1.00)
GFR calc Af Amer: 12 mL/min — ABNORMAL LOW (ref >60)
GFR calc non Af Amer: 10 mL/min — ABNORMAL LOW (ref >60)
Glucose, Bld: 111 mg/dL — ABNORMAL HIGH (ref 70–99)
Potassium: 3.5 mmol/L (ref 3.5–5.1)
Sodium: 136 mmol/L (ref 135–145)
Total Bilirubin: 1.3 mg/dL — ABNORMAL HIGH (ref 0.3–1.2)
Total Protein: 7.5 g/dL (ref 6.5–8.1)

## 2018-08-09 LAB — CBC WITH DIFFERENTIAL/PLATELET
Abs Immature Granulocytes: 0.01 10*3/uL (ref 0.00–0.07)
Basophils Absolute: 0.1 10*3/uL (ref 0.0–0.1)
Basophils Relative: 1 %
Eosinophils Absolute: 0.5 10*3/uL (ref 0.0–0.5)
Eosinophils Relative: 8 %
HCT: 39.3 % (ref 36.0–46.0)
Hemoglobin: 12.4 g/dL (ref 12.0–15.0)
Immature Granulocytes: 0 %
Lymphocytes Relative: 18 %
Lymphs Abs: 1.1 10*3/uL (ref 0.7–4.0)
MCH: 34 pg (ref 26.0–34.0)
MCHC: 31.6 g/dL (ref 30.0–36.0)
MCV: 107.7 fL — ABNORMAL HIGH (ref 80.0–100.0)
Monocytes Absolute: 0.6 10*3/uL (ref 0.1–1.0)
Monocytes Relative: 10 %
Neutro Abs: 3.6 10*3/uL (ref 1.7–7.7)
Neutrophils Relative %: 63 %
Platelets: 157 10*3/uL (ref 150–400)
RBC: 3.65 MIL/uL — ABNORMAL LOW (ref 3.87–5.11)
RDW: 14.6 % (ref 11.5–15.5)
WBC: 5.8 10*3/uL (ref 4.0–10.5)
nRBC: 0 % (ref 0.0–0.2)

## 2018-08-09 NOTE — Discharge Instructions (Signed)
Your testing today does not show a cause for your pain, this is good, it is also frustrating for you I understand.  Please see the attached CT scan report and share this with your doctor.  You should return to the emergency department immediately for severe or worsening symptoms including pain vomiting or fever.  Otherwise follow-up with your doctor in 48 hours.

## 2018-08-09 NOTE — ED Triage Notes (Signed)
Pt reports productive cough x 2 weeks and intermittent sob.  C/O pain in r ribs and back.  Pt says she saw pcp last week and was given antibiotic and cough medication.  Pt says is no better and was told to come to ed for eval.

## 2018-08-09 NOTE — ED Provider Notes (Signed)
[facility] EMERGENCY DEPARTMENT Provider Note   CSN: [id number] Arrival date & time: 08/09/18  7510    History   Chief Complaint Chief Complaint  Patient presents with   Cough    HPI Jody [name] is a 78 y.o. female.     HPI  The patient is an 78 year old female, she has a history of prior COPD, she is on dialysis Tuesdays Thursdays and Saturdays and has not missed any, she has a history of paroxysmal atrial fibrillation and currently is treated with Eliquis.  She presents to the hospital today with a complaint of right-sided chest pain associated with a mild cough and shortness of breath.  She reports that this pain has been present for 2-1/2 weeks.  She was initially seen at her family doctor's office and given an antibiotic for possible infection though she states that the only thing that helped was "the shot".  She is unsure what the medication was that she got or the prescription that she was given.  That being said over the last several days she has had a rather persistent right-sided pain that is worse when she sits up and better when she goes to a supine position.  It is more in the right lower quadrant of the abdomen and into the right side, it is not right up in the chest.  There is no left-sided chest pain, she is not having shortness of breath at this time.  She denies any fevers or swelling of the legs.  She has been compliant with her medications.  Past Medical History:  Diagnosis Date   Anemia in chronic kidney disease (CODE) 06/05/2015   Arthritis    "bad in my legs" (12/07/2014)   Chronic back pain    "dr said my spine is crooked"   Chronic bronchitis ([place])    "get it q yr" (12/07/2014)   CKD stage 4 due to type 2 diabetes mellitus ([place]) 2/58/5277   Complication of anesthesia    headache for 2 days after surgery in May 2019   COPD (chronic obstructive pulmonary disease) ([place])    Gait difficulty 09/02/2014   Hypercholesterolemia    Hypertension     Neuropathy 2015   in legs   NSVT (nonsustained ventricular tachycardia) (HCC)    PAF (paroxysmal atrial fibrillation) ([place]) 06/05/2015   Sinus brady-tachy syndrome ([place])    Stroke ([place]) 05/2014   denies residual on 12/07/2014   Type II diabetes mellitus [facility])     Patient Active Problem List   Diagnosis Date Noted   Chronic blood loss anemia 10/26/2017   Arthritis 10/26/2017   Acute on chronic blood loss anemia 10/26/2017   Iron deficiency anemia 02/23/2017   Chronic diastolic CHF (congestive heart failure) ([place]) 10/15/2016   Elevated troponin 10/15/2016   Type 2 diabetes mellitus with nephropathy ([place]) 10/15/2016   ESRD on dialysis ([place]) 10/15/2016   NSTEMI (non-ST elevated myocardial infarction) ([place]) 10/03/2015   Elevated LFTs 10/03/2015   Encounter for therapeutic drug monitoring 07/29/2015   COPD (chronic obstructive pulmonary disease) ([place]) 06/05/2015   CKD stage 4 due to type 2 diabetes mellitus ([place]) 06/05/2015   Anemia in chronic kidney disease, Stage IV 06/05/2015   Acute on chronic diastolic CHF (congestive heart failure) ([place]) 06/05/2015   Atrial fibrillation ([place]) 06/05/2015   Atrial fibrillation with rapid ventricular response ([place]) 12/07/2014   TIA (transient ischemic attack) 09/02/2014   Gait difficulty 09/02/2014   Other specified transient cerebral ischemias    Stroke with cerebral  ischemia [facility])    Hemispheric carotid artery syndrome    Essential hypertension    Hyperlipidemia    Type 2 diabetes mellitus with other circulatory complications (HCC)    CVA (cerebral infarction) 05/27/2014    Past Surgical History:  Procedure Laterality Date   ABDOMINAL HYSTERECTOMY  ~ [place]  ~ [place] Right 07/06/2017   Procedure: RIGHT RADIOCEPHALIC ARTERIOVENOUS FISTULA creation;  Surgeon: Conrad Selmer, MD;  Location: [place];  Service: Vascular;  Laterality: Right;   [place] Right  09/17/2017   Procedure: SECOND STAGE BASILIC VEIN TRANSPOSITION RIGHT ARM;  Surgeon: Rosetta Posner, MD;  Location: [place];  Service: Vascular;  Laterality: Right;   COLONOSCOPY N/A 10/28/2017   Procedure: COLONOSCOPY;  Surgeon: Rogene Houston, MD;  [facility];  Service: Endoscopy;  Laterality: N/A;   ESOPHAGOGASTRODUODENOSCOPY (EGD) WITH PROPOFOL N/A 10/31/2017   Procedure: ESOPHAGOGASTRODUODENOSCOPY (EGD) WITH PROPOFOL;  Surgeon: Rogene Houston, MD;  [facility];  Service: Endoscopy;  Laterality: N/A;   JOINT REPLACEMENT     TOTAL KNEE ARTHROPLASTY Right ~ [place]  ~ 1970   "9# in my stomach"     OB History   No obstetric history on file.      Home Medications    Prior to Admission medications   Medication Sig Start Date End Date Taking? Authorizing Provider  albuterol (PROVENTIL HFA;VENTOLIN HFA) 108 (90 BASE) MCG/ACT inhaler Inhale 1-2 puffs into the lungs every 6 (six) hours as needed for wheezing or shortness of breath. Reported on 03/01/2015    [provider]  amLODipine (NORVASC) 10 MG tablet Take 1 tablet (10 mg total) by mouth daily. 10/18/16   Orson Eva, MD  amoxicillin (AMOXIL) 500 MG capsule Take 1 capsule by mouth 3 (three) times daily. 08/06/18   [provider]  apixaban (ELIQUIS) 2.5 MG TABS tablet Take 1 tablet (2.5 mg total) by mouth 2 (two) times daily. 04/16/18   Dunn, Nedra Hai, PA-C  atorvastatin (LIPITOR) 40 MG tablet Take 1 tablet by mouth at bedtime.  01/22/16   [provider]  diltiazem (DILACOR XR) 180 MG 24 hr capsule Take 180 mg by mouth daily.    [provider]  fluticasone-salmeterol (ADVAIR HFA) 230-21 MCG/ACT inhaler Inhale 2 puffs into the lungs 2 (two) times daily.    [provider]  hydrALAZINE (APRESOLINE) 100 MG tablet TAKE ONE (1) TABLET THREE (3) TIMES EACH DAY 07/16/17   Herminio Commons, MD  metoprolol tartrate (LOPRESSOR) 25 MG tablet Take 0.5 tablets (12.5 mg  total) by mouth 2 (two) times daily. 10/18/16   Kathie Dike, MD  pantoprazole (PROTONIX) 40 MG tablet Take 1 tablet (40 mg total) by mouth daily. 11/01/17   Isaac Bliss, Rayford Halsted, MD  polyethylene glycol [facility] Floria Raveling) packet Take 17 g by mouth daily as needed (constipation).    [provider]  propranolol (INDERAL) 10 MG tablet Take 1 tablet by mouth 2 (two) times a day. 05/03/18   [provider]  sodium bicarbonate 650 MG tablet Take 1 tablet (650 mg total) by mouth 2 (two) times daily. 10/17/16   Orson Eva, MD    Family History Family History  Problem Relation Age of Onset   Cancer Other    Heart attack Brother    Cancer Sister        breast   Alcohol abuse Brother  Social History Social History   Tobacco Use   Smoking status: Former Smoker    Packs/day: 0.50    Years: 50.00    Pack years: 25.00    Types: Cigarettes    Start date: 03/07/1953    Quit date: 01/14/2004    Years since quitting: 14.5   Smokeless tobacco: Never Used   Tobacco comment: "quit smoking cigarettes  in ~ 2005"  Substance Use Topics   Alcohol use: No    Alcohol/week: 0.0 standard drinks    Comment: 10/242016 "quit drinking beer in ~ 1977"   Drug use: No     Allergies   Codeine   Review of Systems Review of Systems  All other systems reviewed and are negative.    Physical Exam Updated Vital Signs BP (!) 188/129    Pulse 95    Temp 98.1 F (36.7 C) (Oral)    Resp 18    Ht 1.702 m (5\' 7" )    Wt 97.5 kg    SpO2 98%    BMI 33.67 kg/m   Vitals:   08/09/18 1013 08/09/18 1230 08/09/18 1325 08/09/18 1330  BP: 126/88 (!) 147/71 (!) 151/117 (!) 188/129  Pulse:    95  Resp:  (!) 21 (!) 26 18  Temp:      TempSrc:      SpO2:  100% 100% 98%  Weight:      Height:         Physical Exam Vitals signs and nursing note reviewed.  Constitutional:      General: She is not in acute distress.    Appearance: She is well-developed.  HENT:     Head:  Normocephalic and atraumatic.     Mouth/Throat:     Pharynx: No oropharyngeal exudate.  Eyes:     General: No scleral icterus.       Right eye: No discharge.        Left eye: No discharge.     Conjunctiva/sclera: Conjunctivae normal.     Pupils: Pupils are equal, round, and reactive to light.  Neck:     Musculoskeletal: Normal range of motion and neck supple.     Thyroid: No thyromegaly.     Vascular: No JVD.  Cardiovascular:     Rate and Rhythm: Regular rhythm. Tachycardia present.     Heart sounds: Normal heart sounds. No murmur. No friction rub. No gallop.      Comments: Tachycardic to 105, sinus rhythm.  Normal thrill palpated in the dialysis access to the RUE> Pulmonary:     Effort: Pulmonary effort is normal. No respiratory distress.     Breath sounds: Rales present. No wheezing.  Chest:     Chest wall: Tenderness ( There is mild there is mild tenderness over the right chest wall) present.  Abdominal:     General: Bowel sounds are normal. There is no distension.     Palpations: Abdomen is soft. There is no mass.     Tenderness: There is abdominal tenderness.     Comments: There is some tenderness in the right mid abdomen, she is obese, non-peritoneal, no guarding  Musculoskeletal: Normal range of motion.        General: No tenderness.  Lymphadenopathy:     Cervical: No cervical adenopathy.  Skin:    General: Skin is warm and dry.     Findings: No erythema or rash.  Neurological:     Mental Status: She is alert.     Coordination: Coordination  normal.  Psychiatric:        Behavior: Behavior normal.      ED Treatments / Results  Labs (all labs ordered are listed, but only abnormal results are displayed) Labs Reviewed  CBC WITH DIFFERENTIAL/PLATELET - Abnormal; Notable for the following components:      Result Value   RBC 3.65 (*)    MCV 107.7 (*)    All other components within normal limits  COMPREHENSIVE METABOLIC PANEL - Abnormal; Notable for the following  components:   Chloride 94 (*)    Glucose, Bld 111 (*)    BUN 39 (*)    Creatinine, Ser 3.87 (*)    Total Bilirubin 1.3 (*)    GFR calc non Af Amer 10 (*)    GFR calc Af Amer 12 (*)    All other components within normal limits    EKG EKG Interpretation  Date/Time:  Friday August 09 2018 11:11:03 EDT Ventricular Rate:  96 PR Interval:    QRS Duration: 104 QT Interval:  347 QTC Calculation: 439 R Axis:   20 Text Interpretation:  Atrial fibrillation Probable anterior infarct, age indeterminate Baseline wander in lead(s) V1 since last tracing no significant change  - afib seen on prior EKGs Confirmed by Noemi Chapel [phone]) on 08/09/2018 11:15:48 AM   Radiology Ct Abdomen Pelvis Wo Contrast  Result Date: 08/09/2018 CLINICAL DATA:  Abdominal pain question appendicitis EXAM: CT ABDOMEN AND PELVIS WITHOUT CONTRAST TECHNIQUE: Multidetector CT imaging of the abdomen and pelvis was performed following the standard protocol without IV contrast. Sagittal and coronal MPR images reconstructed from axial data set. Oral contrast was not administered. COMPARISON:  08/16/2010 FINDINGS: Lower chest: Chronic subsegmental atelectasis, volume loss and mild bronchiectasis in medial segment RIGHT lower lobe. Additional atelectasis and scarring at base of lingula. Hepatobiliary: Gallbladder and liver normal appearance Pancreas: Normal appearance Spleen: Normal appearance Adrenals/Urinary Tract: Adrenal glands normal appearance. Suspected cyst LEFT kidney 1.7 x 1.5 cm. Mildly lobulated renal contours. Hyperdense nodule at inferior pole LEFT kidney 9 mm diameter, new. No hydronephrosis, hydroureter, or urinary tract calcification. Bladder unremarkable. Stomach/Bowel: Increased stool in rectum. Stomach decompressed, with suboptimal assessment of gastric wall thickness, appears prominent but may be an artifact from underdistention. Remaining bowel loops normal appearance. Appendix not localized, by history surgically  absent. Vascular/Lymphatic: Extensive atherosclerotic calcifications of. Aorta normal caliber. Cardiac chambers appear enlarged. No adenopathy. Reproductive: Uterus surgically absent. Nonvisualization of ovaries. Other: No free air or free fluid. No hernia or acute inflammatory process. Musculoskeletal: Osseous demineralization with degenerative disc and facet disease changes of the lumbar spine associated with rotatory scoliosis. Multifactorial spinal stenosis at L3-L4, to lesser degrees at L2-L3 and L4-L5. IMPRESSION: Question mid LEFT renal cyst 1.70 cm diameter with an additional 9 mm hyperdense nodule at the inferior pole of LEFT kidney, potentially a hyperdense cyst though a solid lesion is not excluded; follow-up renal ultrasound recommended. Prominent gastric wall thickness though this may be an artifact from underdistention. No definite acute intra-abdominal or intrapelvic process identified. Moderate to severe spinal stenosis L3-L4, multifactorial. Extensive atherosclerotic calcifications including coronary arteries. Electronically Signed   By: Lavonia Dana M.D.   On: 08/09/2018 13:09   Dg Chest Port 1 View  Result Date: 08/09/2018 CLINICAL DATA:  Dry cough and right-sided chest pain today. EXAM: PORTABLE CHEST 1 VIEW COMPARISON:  10/26/2017 FINDINGS: The cardiac silhouette, mediastinal and hilar contours are within normal limits and stable. Stable tortuosity and calcification of the thoracic aorta. The right IJ catheter  has been removed since the prior film. The lungs are clear of an acute process. No infiltrates, edema or effusions. Stable mild eventration right hemidiaphragm. The bony thorax is intact. IMPRESSION: No acute cardiopulmonary findings. Electronically Signed   By: Marijo Sanes M.D.   On: 08/09/2018 11:07    Procedures Procedures (including critical care time)  Medications Ordered in ED Medications - No data to display   Initial Impression / Assessment and Plan / ED Course  I  have reviewed the triage vital signs and the nursing notes.  Pertinent labs & imaging results that were available during my care of the patient were reviewed by me and considered in my medical decision making (see chart for details).  Clinical Course as of Aug 09 1403  Fri Aug 09, 2018  1210 RN unable to access peripher IV,  I tried US guided - without success Will do non contrast CT   [BM]    Clinical Course User Index [BM] Noemi Chapel, MD       There is no signs of zoster, the patient has tenderness ranging from the right mid abdomen up onto the chest but is also having some occasional rales with breathing.  Oxygen is 98% on room air and she does not appear to be dyspneic.  We will do a chest x-ray with labs, may need a CT scan to rule out cholecystitis or other intra-abdominal pathology.  Patient agreeable.  Further medical record review shows that the patient has had a prior abdominal tumor that was removed, it was noncancerous.  Patient informed of all of her findings, she has a renal cyst of unknown etiology, otherwise no acute findings.  She is scheduled for dialysis tomorrow, she has a normal white blood cell count and is feeling better.  Patient stable for discharge, she was given a copy of her CT scan to follow-up with her family doctor.  She has not taken her daily medications, I encouraged her to take these medications immediately upon return.  Final Clinical Impressions(s) / ED Diagnoses   Final diagnoses:  Side pain  Renal cyst    ED Discharge Orders    None       Noemi Chapel, MD 08/09/18 1406

## 2019-01-17 ENCOUNTER — Encounter: Payer: Self-pay | Admitting: Cardiovascular Disease

## 2019-01-17 ENCOUNTER — Encounter: Payer: Medicare Other | Admitting: Cardiovascular Disease

## 2019-01-17 ENCOUNTER — Telehealth: Payer: Self-pay | Admitting: Licensed Clinical Social Worker

## 2019-01-17 NOTE — Telephone Encounter (Signed)
CSW referred to assist patient with obtaining a BP cuff. CSW contacted patient to inform cuff will be delivered to home unable to leave message.  CSW available as needed. Raquel Sarna, [place], [place]

## 2019-01-20 NOTE — Progress Notes (Signed)
This encounter was created in error - please disregard.

## 2019-02-15 DIAGNOSIS — Z992 Dependence on renal dialysis: Secondary | ICD-10-CM | POA: Diagnosis not present

## 2019-02-15 DIAGNOSIS — N186 End stage renal disease: Secondary | ICD-10-CM | POA: Diagnosis not present

## 2019-02-18 DIAGNOSIS — N186 End stage renal disease: Secondary | ICD-10-CM | POA: Diagnosis not present

## 2019-02-18 DIAGNOSIS — Z992 Dependence on renal dialysis: Secondary | ICD-10-CM | POA: Diagnosis not present

## 2019-02-20 DIAGNOSIS — N186 End stage renal disease: Secondary | ICD-10-CM | POA: Diagnosis not present

## 2019-02-20 DIAGNOSIS — Z992 Dependence on renal dialysis: Secondary | ICD-10-CM | POA: Diagnosis not present

## 2019-02-22 DIAGNOSIS — N186 End stage renal disease: Secondary | ICD-10-CM | POA: Diagnosis not present

## 2019-02-22 DIAGNOSIS — Z992 Dependence on renal dialysis: Secondary | ICD-10-CM | POA: Diagnosis not present

## 2019-02-25 DIAGNOSIS — N186 End stage renal disease: Secondary | ICD-10-CM | POA: Diagnosis not present

## 2019-02-25 DIAGNOSIS — N2581 Secondary hyperparathyroidism of renal origin: Secondary | ICD-10-CM | POA: Diagnosis not present

## 2019-02-25 DIAGNOSIS — Z992 Dependence on renal dialysis: Secondary | ICD-10-CM | POA: Diagnosis not present

## 2019-02-27 DIAGNOSIS — Z992 Dependence on renal dialysis: Secondary | ICD-10-CM | POA: Diagnosis not present

## 2019-02-27 DIAGNOSIS — N186 End stage renal disease: Secondary | ICD-10-CM | POA: Diagnosis not present

## 2019-02-28 DIAGNOSIS — J449 Chronic obstructive pulmonary disease, unspecified: Secondary | ICD-10-CM | POA: Diagnosis not present

## 2019-02-28 DIAGNOSIS — I10 Essential (primary) hypertension: Secondary | ICD-10-CM | POA: Diagnosis not present

## 2019-02-28 DIAGNOSIS — E119 Type 2 diabetes mellitus without complications: Secondary | ICD-10-CM | POA: Diagnosis not present

## 2019-02-28 DIAGNOSIS — I509 Heart failure, unspecified: Secondary | ICD-10-CM | POA: Diagnosis not present

## 2019-03-01 DIAGNOSIS — N186 End stage renal disease: Secondary | ICD-10-CM | POA: Diagnosis not present

## 2019-03-01 DIAGNOSIS — Z992 Dependence on renal dialysis: Secondary | ICD-10-CM | POA: Diagnosis not present

## 2019-03-04 DIAGNOSIS — Z992 Dependence on renal dialysis: Secondary | ICD-10-CM | POA: Diagnosis not present

## 2019-03-04 DIAGNOSIS — N186 End stage renal disease: Secondary | ICD-10-CM | POA: Diagnosis not present

## 2019-03-06 DIAGNOSIS — Z992 Dependence on renal dialysis: Secondary | ICD-10-CM | POA: Diagnosis not present

## 2019-03-06 DIAGNOSIS — N186 End stage renal disease: Secondary | ICD-10-CM | POA: Diagnosis not present

## 2019-03-08 DIAGNOSIS — N186 End stage renal disease: Secondary | ICD-10-CM | POA: Diagnosis not present

## 2019-03-08 DIAGNOSIS — Z992 Dependence on renal dialysis: Secondary | ICD-10-CM | POA: Diagnosis not present

## 2019-03-11 DIAGNOSIS — Z992 Dependence on renal dialysis: Secondary | ICD-10-CM | POA: Diagnosis not present

## 2019-03-11 DIAGNOSIS — N186 End stage renal disease: Secondary | ICD-10-CM | POA: Diagnosis not present

## 2019-03-11 DIAGNOSIS — N2581 Secondary hyperparathyroidism of renal origin: Secondary | ICD-10-CM | POA: Diagnosis not present

## 2019-03-13 DIAGNOSIS — N186 End stage renal disease: Secondary | ICD-10-CM | POA: Diagnosis not present

## 2019-03-13 DIAGNOSIS — J449 Chronic obstructive pulmonary disease, unspecified: Secondary | ICD-10-CM | POA: Diagnosis not present

## 2019-03-13 DIAGNOSIS — Z992 Dependence on renal dialysis: Secondary | ICD-10-CM | POA: Diagnosis not present

## 2019-03-15 DIAGNOSIS — N186 End stage renal disease: Secondary | ICD-10-CM | POA: Diagnosis not present

## 2019-03-15 DIAGNOSIS — Z992 Dependence on renal dialysis: Secondary | ICD-10-CM | POA: Diagnosis not present

## 2019-03-16 DIAGNOSIS — N186 End stage renal disease: Secondary | ICD-10-CM | POA: Diagnosis not present

## 2019-03-16 DIAGNOSIS — Z992 Dependence on renal dialysis: Secondary | ICD-10-CM | POA: Diagnosis not present

## 2019-03-18 DIAGNOSIS — Z992 Dependence on renal dialysis: Secondary | ICD-10-CM | POA: Diagnosis not present

## 2019-03-18 DIAGNOSIS — N186 End stage renal disease: Secondary | ICD-10-CM | POA: Diagnosis not present

## 2019-03-20 DIAGNOSIS — N186 End stage renal disease: Secondary | ICD-10-CM | POA: Diagnosis not present

## 2019-03-20 DIAGNOSIS — Z992 Dependence on renal dialysis: Secondary | ICD-10-CM | POA: Diagnosis not present

## 2019-03-21 DIAGNOSIS — H2511 Age-related nuclear cataract, right eye: Secondary | ICD-10-CM | POA: Diagnosis not present

## 2019-03-21 DIAGNOSIS — H25811 Combined forms of age-related cataract, right eye: Secondary | ICD-10-CM | POA: Diagnosis not present

## 2019-03-22 DIAGNOSIS — N186 End stage renal disease: Secondary | ICD-10-CM | POA: Diagnosis not present

## 2019-03-22 DIAGNOSIS — Z992 Dependence on renal dialysis: Secondary | ICD-10-CM | POA: Diagnosis not present

## 2019-03-25 DIAGNOSIS — N186 End stage renal disease: Secondary | ICD-10-CM | POA: Diagnosis not present

## 2019-03-25 DIAGNOSIS — N2581 Secondary hyperparathyroidism of renal origin: Secondary | ICD-10-CM | POA: Diagnosis not present

## 2019-03-25 DIAGNOSIS — Z992 Dependence on renal dialysis: Secondary | ICD-10-CM | POA: Diagnosis not present

## 2019-03-27 DIAGNOSIS — Z992 Dependence on renal dialysis: Secondary | ICD-10-CM | POA: Diagnosis not present

## 2019-03-27 DIAGNOSIS — N186 End stage renal disease: Secondary | ICD-10-CM | POA: Diagnosis not present

## 2019-03-28 DIAGNOSIS — I10 Essential (primary) hypertension: Secondary | ICD-10-CM | POA: Diagnosis not present

## 2019-03-28 DIAGNOSIS — E119 Type 2 diabetes mellitus without complications: Secondary | ICD-10-CM | POA: Diagnosis not present

## 2019-03-28 DIAGNOSIS — J449 Chronic obstructive pulmonary disease, unspecified: Secondary | ICD-10-CM | POA: Diagnosis not present

## 2019-03-28 DIAGNOSIS — I509 Heart failure, unspecified: Secondary | ICD-10-CM | POA: Diagnosis not present

## 2019-03-29 DIAGNOSIS — Z992 Dependence on renal dialysis: Secondary | ICD-10-CM | POA: Diagnosis not present

## 2019-03-29 DIAGNOSIS — N186 End stage renal disease: Secondary | ICD-10-CM | POA: Diagnosis not present

## 2019-04-01 DIAGNOSIS — N2581 Secondary hyperparathyroidism of renal origin: Secondary | ICD-10-CM | POA: Diagnosis not present

## 2019-04-01 DIAGNOSIS — Z992 Dependence on renal dialysis: Secondary | ICD-10-CM | POA: Diagnosis not present

## 2019-04-01 DIAGNOSIS — N186 End stage renal disease: Secondary | ICD-10-CM | POA: Diagnosis not present

## 2019-04-05 DIAGNOSIS — N186 End stage renal disease: Secondary | ICD-10-CM | POA: Diagnosis not present

## 2019-04-05 DIAGNOSIS — Z992 Dependence on renal dialysis: Secondary | ICD-10-CM | POA: Diagnosis not present

## 2019-04-08 DIAGNOSIS — Z992 Dependence on renal dialysis: Secondary | ICD-10-CM | POA: Diagnosis not present

## 2019-04-08 DIAGNOSIS — N186 End stage renal disease: Secondary | ICD-10-CM | POA: Diagnosis not present

## 2019-04-10 DIAGNOSIS — N186 End stage renal disease: Secondary | ICD-10-CM | POA: Diagnosis not present

## 2019-04-10 DIAGNOSIS — Z992 Dependence on renal dialysis: Secondary | ICD-10-CM | POA: Diagnosis not present

## 2019-04-12 DIAGNOSIS — N186 End stage renal disease: Secondary | ICD-10-CM | POA: Diagnosis not present

## 2019-04-12 DIAGNOSIS — Z992 Dependence on renal dialysis: Secondary | ICD-10-CM | POA: Diagnosis not present

## 2019-04-13 DIAGNOSIS — J449 Chronic obstructive pulmonary disease, unspecified: Secondary | ICD-10-CM | POA: Diagnosis not present

## 2019-04-15 DIAGNOSIS — N186 End stage renal disease: Secondary | ICD-10-CM | POA: Diagnosis not present

## 2019-04-15 DIAGNOSIS — Z992 Dependence on renal dialysis: Secondary | ICD-10-CM | POA: Diagnosis not present

## 2019-04-17 DIAGNOSIS — Z992 Dependence on renal dialysis: Secondary | ICD-10-CM | POA: Diagnosis not present

## 2019-04-17 DIAGNOSIS — N186 End stage renal disease: Secondary | ICD-10-CM | POA: Diagnosis not present

## 2019-04-19 DIAGNOSIS — Z992 Dependence on renal dialysis: Secondary | ICD-10-CM | POA: Diagnosis not present

## 2019-04-19 DIAGNOSIS — N186 End stage renal disease: Secondary | ICD-10-CM | POA: Diagnosis not present

## 2019-04-22 DIAGNOSIS — Z992 Dependence on renal dialysis: Secondary | ICD-10-CM | POA: Diagnosis not present

## 2019-04-22 DIAGNOSIS — N2581 Secondary hyperparathyroidism of renal origin: Secondary | ICD-10-CM | POA: Diagnosis not present

## 2019-04-22 DIAGNOSIS — N186 End stage renal disease: Secondary | ICD-10-CM | POA: Diagnosis not present

## 2019-04-24 DIAGNOSIS — Z992 Dependence on renal dialysis: Secondary | ICD-10-CM | POA: Diagnosis not present

## 2019-04-24 DIAGNOSIS — N186 End stage renal disease: Secondary | ICD-10-CM | POA: Diagnosis not present

## 2019-04-26 DIAGNOSIS — Z992 Dependence on renal dialysis: Secondary | ICD-10-CM | POA: Diagnosis not present

## 2019-04-26 DIAGNOSIS — N186 End stage renal disease: Secondary | ICD-10-CM | POA: Diagnosis not present

## 2019-04-29 DIAGNOSIS — N186 End stage renal disease: Secondary | ICD-10-CM | POA: Diagnosis not present

## 2019-04-29 DIAGNOSIS — Z992 Dependence on renal dialysis: Secondary | ICD-10-CM | POA: Diagnosis not present

## 2019-05-01 DIAGNOSIS — N186 End stage renal disease: Secondary | ICD-10-CM | POA: Diagnosis not present

## 2019-05-01 DIAGNOSIS — Z992 Dependence on renal dialysis: Secondary | ICD-10-CM | POA: Diagnosis not present

## 2019-05-03 DIAGNOSIS — N186 End stage renal disease: Secondary | ICD-10-CM | POA: Diagnosis not present

## 2019-05-03 DIAGNOSIS — Z992 Dependence on renal dialysis: Secondary | ICD-10-CM | POA: Diagnosis not present

## 2019-05-06 DIAGNOSIS — N186 End stage renal disease: Secondary | ICD-10-CM | POA: Diagnosis not present

## 2019-05-06 DIAGNOSIS — D509 Iron deficiency anemia, unspecified: Secondary | ICD-10-CM | POA: Diagnosis not present

## 2019-05-06 DIAGNOSIS — Z992 Dependence on renal dialysis: Secondary | ICD-10-CM | POA: Diagnosis not present

## 2019-05-06 DIAGNOSIS — N2581 Secondary hyperparathyroidism of renal origin: Secondary | ICD-10-CM | POA: Diagnosis not present

## 2019-05-07 DIAGNOSIS — Z1211 Encounter for screening for malignant neoplasm of colon: Secondary | ICD-10-CM | POA: Diagnosis not present

## 2019-05-07 DIAGNOSIS — Z299 Encounter for prophylactic measures, unspecified: Secondary | ICD-10-CM | POA: Diagnosis not present

## 2019-05-07 DIAGNOSIS — Z7189 Other specified counseling: Secondary | ICD-10-CM | POA: Diagnosis not present

## 2019-05-07 DIAGNOSIS — Z0000 Encounter for general adult medical examination without abnormal findings: Secondary | ICD-10-CM | POA: Diagnosis not present

## 2019-05-08 DIAGNOSIS — Z992 Dependence on renal dialysis: Secondary | ICD-10-CM | POA: Diagnosis not present

## 2019-05-08 DIAGNOSIS — N186 End stage renal disease: Secondary | ICD-10-CM | POA: Diagnosis not present

## 2019-05-10 DIAGNOSIS — N186 End stage renal disease: Secondary | ICD-10-CM | POA: Diagnosis not present

## 2019-05-10 DIAGNOSIS — Z992 Dependence on renal dialysis: Secondary | ICD-10-CM | POA: Diagnosis not present

## 2019-05-13 DIAGNOSIS — Z992 Dependence on renal dialysis: Secondary | ICD-10-CM | POA: Diagnosis not present

## 2019-05-13 DIAGNOSIS — N186 End stage renal disease: Secondary | ICD-10-CM | POA: Diagnosis not present

## 2019-05-14 DIAGNOSIS — Z992 Dependence on renal dialysis: Secondary | ICD-10-CM | POA: Diagnosis not present

## 2019-05-14 DIAGNOSIS — J449 Chronic obstructive pulmonary disease, unspecified: Secondary | ICD-10-CM | POA: Diagnosis not present

## 2019-05-14 DIAGNOSIS — N186 End stage renal disease: Secondary | ICD-10-CM | POA: Diagnosis not present

## 2019-05-15 DIAGNOSIS — N186 End stage renal disease: Secondary | ICD-10-CM | POA: Diagnosis not present

## 2019-05-15 DIAGNOSIS — Z992 Dependence on renal dialysis: Secondary | ICD-10-CM | POA: Diagnosis not present

## 2019-05-17 DIAGNOSIS — N186 End stage renal disease: Secondary | ICD-10-CM | POA: Diagnosis not present

## 2019-05-17 DIAGNOSIS — Z992 Dependence on renal dialysis: Secondary | ICD-10-CM | POA: Diagnosis not present

## 2019-05-20 DIAGNOSIS — N186 End stage renal disease: Secondary | ICD-10-CM | POA: Diagnosis not present

## 2019-05-20 DIAGNOSIS — Z992 Dependence on renal dialysis: Secondary | ICD-10-CM | POA: Diagnosis not present

## 2019-05-22 DIAGNOSIS — N186 End stage renal disease: Secondary | ICD-10-CM | POA: Diagnosis not present

## 2019-05-22 DIAGNOSIS — Z992 Dependence on renal dialysis: Secondary | ICD-10-CM | POA: Diagnosis not present

## 2019-05-24 DIAGNOSIS — Z992 Dependence on renal dialysis: Secondary | ICD-10-CM | POA: Diagnosis not present

## 2019-05-24 DIAGNOSIS — N186 End stage renal disease: Secondary | ICD-10-CM | POA: Diagnosis not present

## 2019-05-26 ENCOUNTER — Telehealth: Payer: Self-pay | Admitting: *Deleted

## 2019-05-26 NOTE — Telephone Encounter (Signed)
Patient verbally consented for tele-health visits with [facility] and understands that her insurance company will be billed for the encounter.  Aware to have vitals available-from Saturday dialysis

## 2019-05-27 DIAGNOSIS — N186 End stage renal disease: Secondary | ICD-10-CM | POA: Diagnosis not present

## 2019-05-27 DIAGNOSIS — Z992 Dependence on renal dialysis: Secondary | ICD-10-CM | POA: Diagnosis not present

## 2019-05-27 DIAGNOSIS — N2581 Secondary hyperparathyroidism of renal origin: Secondary | ICD-10-CM | POA: Diagnosis not present

## 2019-05-29 DIAGNOSIS — Z992 Dependence on renal dialysis: Secondary | ICD-10-CM | POA: Diagnosis not present

## 2019-05-29 DIAGNOSIS — N186 End stage renal disease: Secondary | ICD-10-CM | POA: Diagnosis not present

## 2019-05-31 DIAGNOSIS — Z992 Dependence on renal dialysis: Secondary | ICD-10-CM | POA: Diagnosis not present

## 2019-05-31 DIAGNOSIS — N186 End stage renal disease: Secondary | ICD-10-CM | POA: Diagnosis not present

## 2019-06-03 DIAGNOSIS — Z992 Dependence on renal dialysis: Secondary | ICD-10-CM | POA: Diagnosis not present

## 2019-06-03 DIAGNOSIS — N186 End stage renal disease: Secondary | ICD-10-CM | POA: Diagnosis not present

## 2019-06-05 DIAGNOSIS — Z992 Dependence on renal dialysis: Secondary | ICD-10-CM | POA: Diagnosis not present

## 2019-06-05 DIAGNOSIS — N186 End stage renal disease: Secondary | ICD-10-CM | POA: Diagnosis not present

## 2019-06-07 DIAGNOSIS — N186 End stage renal disease: Secondary | ICD-10-CM | POA: Diagnosis not present

## 2019-06-07 DIAGNOSIS — Z992 Dependence on renal dialysis: Secondary | ICD-10-CM | POA: Diagnosis not present

## 2019-06-10 DIAGNOSIS — N186 End stage renal disease: Secondary | ICD-10-CM | POA: Diagnosis not present

## 2019-06-10 DIAGNOSIS — N2581 Secondary hyperparathyroidism of renal origin: Secondary | ICD-10-CM | POA: Diagnosis not present

## 2019-06-10 DIAGNOSIS — D509 Iron deficiency anemia, unspecified: Secondary | ICD-10-CM | POA: Diagnosis not present

## 2019-06-10 DIAGNOSIS — Z992 Dependence on renal dialysis: Secondary | ICD-10-CM | POA: Diagnosis not present

## 2019-06-12 DIAGNOSIS — N186 End stage renal disease: Secondary | ICD-10-CM | POA: Diagnosis not present

## 2019-06-12 DIAGNOSIS — Z992 Dependence on renal dialysis: Secondary | ICD-10-CM | POA: Diagnosis not present

## 2019-06-13 DIAGNOSIS — Z992 Dependence on renal dialysis: Secondary | ICD-10-CM | POA: Diagnosis not present

## 2019-06-13 DIAGNOSIS — N186 End stage renal disease: Secondary | ICD-10-CM | POA: Diagnosis not present

## 2019-06-13 DIAGNOSIS — J449 Chronic obstructive pulmonary disease, unspecified: Secondary | ICD-10-CM | POA: Diagnosis not present

## 2019-06-14 DIAGNOSIS — Z992 Dependence on renal dialysis: Secondary | ICD-10-CM | POA: Diagnosis not present

## 2019-06-14 DIAGNOSIS — N186 End stage renal disease: Secondary | ICD-10-CM | POA: Diagnosis not present

## 2019-06-17 DIAGNOSIS — Z992 Dependence on renal dialysis: Secondary | ICD-10-CM | POA: Diagnosis not present

## 2019-06-17 DIAGNOSIS — N186 End stage renal disease: Secondary | ICD-10-CM | POA: Diagnosis not present

## 2019-06-19 DIAGNOSIS — N186 End stage renal disease: Secondary | ICD-10-CM | POA: Diagnosis not present

## 2019-06-19 DIAGNOSIS — Z992 Dependence on renal dialysis: Secondary | ICD-10-CM | POA: Diagnosis not present

## 2019-06-21 DIAGNOSIS — N186 End stage renal disease: Secondary | ICD-10-CM | POA: Diagnosis not present

## 2019-06-21 DIAGNOSIS — Z992 Dependence on renal dialysis: Secondary | ICD-10-CM | POA: Diagnosis not present

## 2019-06-24 DIAGNOSIS — Z992 Dependence on renal dialysis: Secondary | ICD-10-CM | POA: Diagnosis not present

## 2019-06-24 DIAGNOSIS — N2581 Secondary hyperparathyroidism of renal origin: Secondary | ICD-10-CM | POA: Diagnosis not present

## 2019-06-24 DIAGNOSIS — N186 End stage renal disease: Secondary | ICD-10-CM | POA: Diagnosis not present

## 2019-06-26 DIAGNOSIS — N186 End stage renal disease: Secondary | ICD-10-CM | POA: Diagnosis not present

## 2019-06-26 DIAGNOSIS — Z992 Dependence on renal dialysis: Secondary | ICD-10-CM | POA: Diagnosis not present

## 2019-06-28 DIAGNOSIS — N186 End stage renal disease: Secondary | ICD-10-CM | POA: Diagnosis not present

## 2019-06-28 DIAGNOSIS — Z992 Dependence on renal dialysis: Secondary | ICD-10-CM | POA: Diagnosis not present

## 2019-06-30 ENCOUNTER — Other Ambulatory Visit: Payer: Self-pay

## 2019-06-30 ENCOUNTER — Ambulatory Visit (INDEPENDENT_AMBULATORY_CARE_PROVIDER_SITE_OTHER): Payer: Medicare Other | Admitting: Cardiovascular Disease

## 2019-06-30 ENCOUNTER — Encounter: Payer: Self-pay | Admitting: Cardiovascular Disease

## 2019-06-30 VITALS — BP 118/80 | HR 88 | Ht 67.0 in | Wt 202.0 lb

## 2019-06-30 DIAGNOSIS — I739 Peripheral vascular disease, unspecified: Secondary | ICD-10-CM

## 2019-06-30 DIAGNOSIS — I472 Ventricular tachycardia: Secondary | ICD-10-CM

## 2019-06-30 DIAGNOSIS — I4819 Other persistent atrial fibrillation: Secondary | ICD-10-CM | POA: Diagnosis not present

## 2019-06-30 DIAGNOSIS — I5032 Chronic diastolic (congestive) heart failure: Secondary | ICD-10-CM | POA: Diagnosis not present

## 2019-06-30 DIAGNOSIS — I4729 Other ventricular tachycardia: Secondary | ICD-10-CM

## 2019-06-30 NOTE — Progress Notes (Addendum)
SUBJECTIVE: The patient presents for routine follow-up.  I have not personally evaluated her since October 2018.  Past medical history includes paroxysmal atrial fibrillation, sinus bradycardia, chronic diastolic heart failure, nonsustained ventricular tachycardia, hypertension, anemia due to chronic kidney disease, end-stage renal disease on hemodialysis, COPD, diffuse arthritis, hyperlipidemia, and stroke.   She does not get much physical activity due to diffuse arthritis and neuropathy.  Chronic exertional dyspnea appears to be stable.  She has occasional palpitations, particularly when she is upset.  She told me her sister-in-law passed away yesterday.  She denies exertional chest pain.  She also denies bleeding problems with Eliquis.  It does not appear she is actually taking either propranolol or amlodipine although both are listed on her medication list.   Review of Systems: As per "subjective", otherwise negative.  Allergies  Allergen Reactions  . Codeine Nausea And Vomiting    Current Outpatient Medications  Medication Sig Dispense Refill  . albuterol (PROVENTIL HFA;VENTOLIN HFA) 108 (90 BASE) MCG/ACT inhaler Inhale 1-2 puffs into the lungs every 6 (six) hours as needed for wheezing or shortness of breath. Reported on 03/01/2015    . apixaban (ELIQUIS) 2.5 MG TABS tablet Take 1 tablet (2.5 mg total) by mouth 2 (two) times daily. 60 tablet 11  . atorvastatin (LIPITOR) 40 MG tablet Take 1 tablet by mouth at bedtime.   0  . diltiazem (DILACOR XR) 180 MG 24 hr capsule Take 180 mg by mouth daily.    . fluticasone-salmeterol (ADVAIR HFA) 230-21 MCG/ACT inhaler Inhale 2 puffs into the lungs 2 (two) times daily.    . hydrALAZINE (APRESOLINE) 100 MG tablet TAKE ONE (1) TABLET THREE (3) TIMES EACH DAY 270 tablet 0  . metoprolol tartrate (LOPRESSOR) 25 MG tablet Take 0.5 tablets (12.5 mg total) by mouth 2 (two) times daily. 30 tablet 0  . pantoprazole (PROTONIX) 40 MG tablet Take  1 tablet (40 mg total) by mouth daily. 30 tablet 2  . polyethylene glycol (MIRALAX / GLYCOLAX) packet Take 17 g by mouth daily as needed (constipation).    . sodium bicarbonate 650 MG tablet Take 1 tablet (650 mg total) by mouth 2 (two) times daily. 60 tablet 1   No current facility-administered medications for this visit.    Past Medical History:  Diagnosis Date  . Anemia in chronic kidney disease (CODE) 06/05/2015  . Arthritis    "bad in my legs" (12/07/2014)  . Chronic back pain    "dr said my spine is crooked"  . Chronic bronchitis ([place])    "get it q yr" (12/07/2014)  . CKD stage 4 due to type 2 diabetes mellitus ([place]) 06/05/2015  . Complication of anesthesia    headache for 2 days after surgery in May 2019  . COPD (chronic obstructive pulmonary disease) ([place])   . Gait difficulty 09/02/2014  . Hypercholesterolemia   . Hypertension   . Neuropathy 2015   in legs  . NSVT (nonsustained ventricular tachycardia) ([place])   . PAF (paroxysmal atrial fibrillation) ([place]) 06/05/2015  . Sinus brady-tachy syndrome ([place])   . Stroke ([place]) 05/2014   denies residual on 12/07/2014  . Type II diabetes mellitus ([place])     Past Surgical History:  Procedure Laterality Date  . ABDOMINAL HYSTERECTOMY  ~ 1970  . APPENDECTOMY  ~ 1970  . AV FISTULA PLACEMENT Right 07/06/2017   Procedure: RIGHT RADIOCEPHALIC ARTERIOVENOUS FISTULA creation;  Surgeon: Conrad Sparkman, MD;  Location: [place];  Service: Vascular;  Laterality:  Right;  [name] BASCILIC VEIN TRANSPOSITION Right 09/17/2017   Procedure: SECOND STAGE BASILIC VEIN TRANSPOSITION RIGHT ARM;  Surgeon: Rosetta Posner, MD;  Location: [place];  Service: Vascular;  Laterality: Right;  . COLONOSCOPY N/A 10/28/2017   Procedure: COLONOSCOPY;  Surgeon: Rogene Houston, MD;  [facility];  Service: Endoscopy;  Laterality: N/A;  . ESOPHAGOGASTRODUODENOSCOPY (EGD) WITH PROPOFOL N/A 10/31/2017   Procedure: ESOPHAGOGASTRODUODENOSCOPY (EGD) WITH PROPOFOL;  Surgeon:  Rogene Houston, MD;  [facility];  Service: Endoscopy;  Laterality: N/A;  . JOINT REPLACEMENT    . TOTAL KNEE ARTHROPLASTY Right ~ 1986  . TUMOR EXCISION  ~ 1970   "9# in my stomach"    Social History   Socioeconomic History  . Marital status: Widowed    Spouse name: Not on file  . Number of children: 0  . Years of education: 9  . Highest education level: Not on file  Occupational History  . Occupation: retired  Tobacco Use  . Smoking status: Former Smoker    Packs/day: 0.50    Years: 50.00    Pack years: 25.00    Types: Cigarettes    Start date: 03/07/1953    Quit date: 01/14/2004    Years since quitting: 15.4  . Smokeless tobacco: Never Used  . Tobacco comment: "quit smoking cigarettes  in ~ 2005"  Substance and Sexual Activity  . Alcohol use: No    Alcohol/week: 0.0 standard drinks    Comment: 10/242016 "quit drinking beer in ~ 1977"  . Drug use: No  . Sexual activity: Never  Other Topics Concern  . Not on file  Social History Narrative   Patient drinks caffeine a few times a week.   Patient is right handed.   Social Determinants of Health   Financial Resource Strain:   . Difficulty of Paying Living Expenses:   Food Insecurity:   . Worried About Charity fundraiser in the Last Year:   . Arboriculturist in the Last Year:   Transportation Needs:   . Film/video editor (Medical):   [name] Lack of Transportation (Non-Medical):   Physical Activity:   . Days of Exercise per Week:   . Minutes of Exercise per Session:   Stress:   . Feeling of Stress :   Social Connections:   . Frequency of Communication with Friends and Family:   . Frequency of Social Gatherings with Friends and Family:   . Attends Religious Services:   . Active Member of Clubs or Organizations:   . Attends Archivist Meetings:   [name] Marital Status:   Intimate Partner Violence:   . Fear of Current or Ex-Partner:   . Emotionally Abused:   [name] Physically Abused:   . Sexually  Abused:     Orson Slick, LPN was present throughout the entirety of the encounter.  Vitals:   06/30/19 1002  BP: 118/80  Pulse: 88  SpO2: 95%  Weight: 202 lb (91.6 kg)  Height: 5\' 7"  (1.702 m)    Wt Readings from Last 3 Encounters:  06/30/19 202 lb (91.6 kg)  01/17/19 213 lb (96.6 kg)  08/09/18 215 lb (97.5 kg)     PHYSICAL EXAM General: NAD HEENT: Normal. Neck: No JVD, no thyromegaly. Lungs: Clear to auscultation bilaterally with normal respiratory effort. CV: Regular rate and irregular rhythm, normal S1/S2, no S3, no murmur. No pretibial or periankle edema.  No carotid bruit.   Abdomen: Soft, nontender, no distention.  Neurologic: Alert and oriented.  Psych: Normal affect. Skin: Normal. Musculoskeletal: No gross deformities.      Labs: Lab Results  Component Value Date/Time   K 3.5 08/09/2018 10:31 AM   BUN 39 (H) 08/09/2018 10:31 AM   CREATININE 3.87 (H) 08/09/2018 10:31 AM   ALT 16 08/09/2018 10:31 AM   TSH 2.217 10/03/2015 08:58 AM   TSH 1.247 05/28/2014 07:45 AM   HGB 12.4 08/09/2018 10:31 AM     Lipids: Lab Results  Component Value Date/Time   LDLCALC 92 10/04/2015 03:01 AM   CHOL 156 10/04/2015 03:01 AM   TRIG 51 10/04/2015 03:01 AM   HDL 54 10/04/2015 03:01 AM       ASSESSMENT AND PLAN:  1.  Persistent atrial fibrillation: Symptomatically stable on long-acting diltiazem and metoprolol.  Anticoagulated with low-dose apixaban 2.5 mg twice daily.  No changes to therapy.  2.  Chronic diastolic heart failure: Volume managed by hemodialysis.  3.  History of nonsustained ventricular tachycardia: This appears to be quiescent.  Continue beta-blocker as tolerated.  4.  Peripheral arterial disease: Arterial Dopplers on 04/19/2018 demonstrated mild right sided PAD and mild to moderate left-sided PAD.    Disposition: Follow up 1 year   Kate Sable, M.D., F.A.C.C.

## 2019-06-30 NOTE — Patient Instructions (Addendum)

## 2019-07-01 DIAGNOSIS — Z992 Dependence on renal dialysis: Secondary | ICD-10-CM | POA: Diagnosis not present

## 2019-07-01 DIAGNOSIS — N186 End stage renal disease: Secondary | ICD-10-CM | POA: Diagnosis not present

## 2019-07-03 DIAGNOSIS — N186 End stage renal disease: Secondary | ICD-10-CM | POA: Diagnosis not present

## 2019-07-03 DIAGNOSIS — Z992 Dependence on renal dialysis: Secondary | ICD-10-CM | POA: Diagnosis not present

## 2019-07-05 DIAGNOSIS — N186 End stage renal disease: Secondary | ICD-10-CM | POA: Diagnosis not present

## 2019-07-05 DIAGNOSIS — Z992 Dependence on renal dialysis: Secondary | ICD-10-CM | POA: Diagnosis not present

## 2019-07-08 DIAGNOSIS — Z992 Dependence on renal dialysis: Secondary | ICD-10-CM | POA: Diagnosis not present

## 2019-07-08 DIAGNOSIS — N186 End stage renal disease: Secondary | ICD-10-CM | POA: Diagnosis not present

## 2019-07-08 DIAGNOSIS — N2581 Secondary hyperparathyroidism of renal origin: Secondary | ICD-10-CM | POA: Diagnosis not present

## 2019-07-10 DIAGNOSIS — Z992 Dependence on renal dialysis: Secondary | ICD-10-CM | POA: Diagnosis not present

## 2019-07-10 DIAGNOSIS — N186 End stage renal disease: Secondary | ICD-10-CM | POA: Diagnosis not present

## 2019-07-12 DIAGNOSIS — Z992 Dependence on renal dialysis: Secondary | ICD-10-CM | POA: Diagnosis not present

## 2019-07-12 DIAGNOSIS — N186 End stage renal disease: Secondary | ICD-10-CM | POA: Diagnosis not present

## 2019-07-13 DIAGNOSIS — I509 Heart failure, unspecified: Secondary | ICD-10-CM | POA: Diagnosis not present

## 2019-07-13 DIAGNOSIS — J449 Chronic obstructive pulmonary disease, unspecified: Secondary | ICD-10-CM | POA: Diagnosis not present

## 2019-07-13 DIAGNOSIS — E119 Type 2 diabetes mellitus without complications: Secondary | ICD-10-CM | POA: Diagnosis not present

## 2019-07-13 DIAGNOSIS — I10 Essential (primary) hypertension: Secondary | ICD-10-CM | POA: Diagnosis not present

## 2019-07-14 DIAGNOSIS — Z992 Dependence on renal dialysis: Secondary | ICD-10-CM | POA: Diagnosis not present

## 2019-07-14 DIAGNOSIS — J449 Chronic obstructive pulmonary disease, unspecified: Secondary | ICD-10-CM | POA: Diagnosis not present

## 2019-07-14 DIAGNOSIS — N186 End stage renal disease: Secondary | ICD-10-CM | POA: Diagnosis not present

## 2019-07-15 DIAGNOSIS — Z992 Dependence on renal dialysis: Secondary | ICD-10-CM | POA: Diagnosis not present

## 2019-07-15 DIAGNOSIS — N186 End stage renal disease: Secondary | ICD-10-CM | POA: Diagnosis not present

## 2019-07-17 DIAGNOSIS — N186 End stage renal disease: Secondary | ICD-10-CM | POA: Diagnosis not present

## 2019-07-17 DIAGNOSIS — Z992 Dependence on renal dialysis: Secondary | ICD-10-CM | POA: Diagnosis not present

## 2019-07-19 DIAGNOSIS — N186 End stage renal disease: Secondary | ICD-10-CM | POA: Diagnosis not present

## 2019-07-19 DIAGNOSIS — Z992 Dependence on renal dialysis: Secondary | ICD-10-CM | POA: Diagnosis not present

## 2019-07-22 DIAGNOSIS — N186 End stage renal disease: Secondary | ICD-10-CM | POA: Diagnosis not present

## 2019-07-22 DIAGNOSIS — Z992 Dependence on renal dialysis: Secondary | ICD-10-CM | POA: Diagnosis not present

## 2019-07-22 DIAGNOSIS — N2581 Secondary hyperparathyroidism of renal origin: Secondary | ICD-10-CM | POA: Diagnosis not present

## 2019-07-24 DIAGNOSIS — Z992 Dependence on renal dialysis: Secondary | ICD-10-CM | POA: Diagnosis not present

## 2019-07-24 DIAGNOSIS — N186 End stage renal disease: Secondary | ICD-10-CM | POA: Diagnosis not present

## 2019-07-26 DIAGNOSIS — Z992 Dependence on renal dialysis: Secondary | ICD-10-CM | POA: Diagnosis not present

## 2019-07-26 DIAGNOSIS — N186 End stage renal disease: Secondary | ICD-10-CM | POA: Diagnosis not present

## 2019-07-29 DIAGNOSIS — Z992 Dependence on renal dialysis: Secondary | ICD-10-CM | POA: Diagnosis not present

## 2019-07-29 DIAGNOSIS — N186 End stage renal disease: Secondary | ICD-10-CM | POA: Diagnosis not present

## 2019-07-29 DIAGNOSIS — N2581 Secondary hyperparathyroidism of renal origin: Secondary | ICD-10-CM | POA: Diagnosis not present

## 2019-07-31 DIAGNOSIS — Z992 Dependence on renal dialysis: Secondary | ICD-10-CM | POA: Diagnosis not present

## 2019-07-31 DIAGNOSIS — N186 End stage renal disease: Secondary | ICD-10-CM | POA: Diagnosis not present

## 2019-08-01 DIAGNOSIS — E11319 Type 2 diabetes mellitus with unspecified diabetic retinopathy without macular edema: Secondary | ICD-10-CM | POA: Diagnosis not present

## 2019-08-02 DIAGNOSIS — N186 End stage renal disease: Secondary | ICD-10-CM | POA: Diagnosis not present

## 2019-08-02 DIAGNOSIS — Z992 Dependence on renal dialysis: Secondary | ICD-10-CM | POA: Diagnosis not present

## 2019-08-05 DIAGNOSIS — N186 End stage renal disease: Secondary | ICD-10-CM | POA: Diagnosis not present

## 2019-08-05 DIAGNOSIS — Z992 Dependence on renal dialysis: Secondary | ICD-10-CM | POA: Diagnosis not present

## 2019-08-07 DIAGNOSIS — Z992 Dependence on renal dialysis: Secondary | ICD-10-CM | POA: Diagnosis not present

## 2019-08-07 DIAGNOSIS — N186 End stage renal disease: Secondary | ICD-10-CM | POA: Diagnosis not present

## 2019-08-09 DIAGNOSIS — N186 End stage renal disease: Secondary | ICD-10-CM | POA: Diagnosis not present

## 2019-08-09 DIAGNOSIS — Z992 Dependence on renal dialysis: Secondary | ICD-10-CM | POA: Diagnosis not present

## 2019-08-12 DIAGNOSIS — N2581 Secondary hyperparathyroidism of renal origin: Secondary | ICD-10-CM | POA: Diagnosis not present

## 2019-08-12 DIAGNOSIS — N186 End stage renal disease: Secondary | ICD-10-CM | POA: Diagnosis not present

## 2019-08-12 DIAGNOSIS — D509 Iron deficiency anemia, unspecified: Secondary | ICD-10-CM | POA: Diagnosis not present

## 2019-08-12 DIAGNOSIS — Z992 Dependence on renal dialysis: Secondary | ICD-10-CM | POA: Diagnosis not present

## 2019-08-13 DIAGNOSIS — J449 Chronic obstructive pulmonary disease, unspecified: Secondary | ICD-10-CM | POA: Diagnosis not present

## 2019-08-13 DIAGNOSIS — Z992 Dependence on renal dialysis: Secondary | ICD-10-CM | POA: Diagnosis not present

## 2019-08-13 DIAGNOSIS — I509 Heart failure, unspecified: Secondary | ICD-10-CM | POA: Diagnosis not present

## 2019-08-13 DIAGNOSIS — N186 End stage renal disease: Secondary | ICD-10-CM | POA: Diagnosis not present

## 2019-08-13 DIAGNOSIS — I10 Essential (primary) hypertension: Secondary | ICD-10-CM | POA: Diagnosis not present

## 2019-08-13 DIAGNOSIS — E119 Type 2 diabetes mellitus without complications: Secondary | ICD-10-CM | POA: Diagnosis not present

## 2019-08-14 DIAGNOSIS — N186 End stage renal disease: Secondary | ICD-10-CM | POA: Diagnosis not present

## 2019-08-14 DIAGNOSIS — Z992 Dependence on renal dialysis: Secondary | ICD-10-CM | POA: Diagnosis not present

## 2019-08-16 DIAGNOSIS — Z992 Dependence on renal dialysis: Secondary | ICD-10-CM | POA: Diagnosis not present

## 2019-08-16 DIAGNOSIS — N186 End stage renal disease: Secondary | ICD-10-CM | POA: Diagnosis not present

## 2019-08-19 DIAGNOSIS — N186 End stage renal disease: Secondary | ICD-10-CM | POA: Diagnosis not present

## 2019-08-19 DIAGNOSIS — Z992 Dependence on renal dialysis: Secondary | ICD-10-CM | POA: Diagnosis not present

## 2019-08-21 DIAGNOSIS — Z992 Dependence on renal dialysis: Secondary | ICD-10-CM | POA: Diagnosis not present

## 2019-08-21 DIAGNOSIS — N186 End stage renal disease: Secondary | ICD-10-CM | POA: Diagnosis not present

## 2019-08-23 DIAGNOSIS — N186 End stage renal disease: Secondary | ICD-10-CM | POA: Diagnosis not present

## 2019-08-23 DIAGNOSIS — Z992 Dependence on renal dialysis: Secondary | ICD-10-CM | POA: Diagnosis not present

## 2019-08-26 DIAGNOSIS — N2581 Secondary hyperparathyroidism of renal origin: Secondary | ICD-10-CM | POA: Diagnosis not present

## 2019-08-26 DIAGNOSIS — Z992 Dependence on renal dialysis: Secondary | ICD-10-CM | POA: Diagnosis not present

## 2019-08-26 DIAGNOSIS — N186 End stage renal disease: Secondary | ICD-10-CM | POA: Diagnosis not present

## 2019-08-28 DIAGNOSIS — N186 End stage renal disease: Secondary | ICD-10-CM | POA: Diagnosis not present

## 2019-08-28 DIAGNOSIS — Z992 Dependence on renal dialysis: Secondary | ICD-10-CM | POA: Diagnosis not present

## 2019-08-29 DIAGNOSIS — Z299 Encounter for prophylactic measures, unspecified: Secondary | ICD-10-CM | POA: Diagnosis not present

## 2019-08-29 DIAGNOSIS — E1122 Type 2 diabetes mellitus with diabetic chronic kidney disease: Secondary | ICD-10-CM | POA: Diagnosis not present

## 2019-08-29 DIAGNOSIS — J449 Chronic obstructive pulmonary disease, unspecified: Secondary | ICD-10-CM | POA: Diagnosis not present

## 2019-08-29 DIAGNOSIS — E1165 Type 2 diabetes mellitus with hyperglycemia: Secondary | ICD-10-CM | POA: Diagnosis not present

## 2019-08-29 DIAGNOSIS — I10 Essential (primary) hypertension: Secondary | ICD-10-CM | POA: Diagnosis not present

## 2019-08-30 DIAGNOSIS — N186 End stage renal disease: Secondary | ICD-10-CM | POA: Diagnosis not present

## 2019-08-30 DIAGNOSIS — Z992 Dependence on renal dialysis: Secondary | ICD-10-CM | POA: Diagnosis not present

## 2019-09-01 DIAGNOSIS — J441 Chronic obstructive pulmonary disease with (acute) exacerbation: Secondary | ICD-10-CM | POA: Diagnosis not present

## 2019-09-01 DIAGNOSIS — R0602 Shortness of breath: Secondary | ICD-10-CM | POA: Diagnosis not present

## 2019-09-02 DIAGNOSIS — N186 End stage renal disease: Secondary | ICD-10-CM | POA: Diagnosis not present

## 2019-09-02 DIAGNOSIS — Z992 Dependence on renal dialysis: Secondary | ICD-10-CM | POA: Diagnosis not present

## 2019-09-04 DIAGNOSIS — Z992 Dependence on renal dialysis: Secondary | ICD-10-CM | POA: Diagnosis not present

## 2019-09-04 DIAGNOSIS — N186 End stage renal disease: Secondary | ICD-10-CM | POA: Diagnosis not present

## 2019-09-06 DIAGNOSIS — Z992 Dependence on renal dialysis: Secondary | ICD-10-CM | POA: Diagnosis not present

## 2019-09-06 DIAGNOSIS — N186 End stage renal disease: Secondary | ICD-10-CM | POA: Diagnosis not present

## 2019-09-09 DIAGNOSIS — N186 End stage renal disease: Secondary | ICD-10-CM | POA: Diagnosis not present

## 2019-09-09 DIAGNOSIS — Z992 Dependence on renal dialysis: Secondary | ICD-10-CM | POA: Diagnosis not present

## 2019-09-11 DIAGNOSIS — Z992 Dependence on renal dialysis: Secondary | ICD-10-CM | POA: Diagnosis not present

## 2019-09-11 DIAGNOSIS — N186 End stage renal disease: Secondary | ICD-10-CM | POA: Diagnosis not present

## 2019-09-12 DIAGNOSIS — E119 Type 2 diabetes mellitus without complications: Secondary | ICD-10-CM | POA: Diagnosis not present

## 2019-09-12 DIAGNOSIS — I509 Heart failure, unspecified: Secondary | ICD-10-CM | POA: Diagnosis not present

## 2019-09-12 DIAGNOSIS — I10 Essential (primary) hypertension: Secondary | ICD-10-CM | POA: Diagnosis not present

## 2019-09-12 DIAGNOSIS — J449 Chronic obstructive pulmonary disease, unspecified: Secondary | ICD-10-CM | POA: Diagnosis not present

## 2019-09-13 DIAGNOSIS — Z992 Dependence on renal dialysis: Secondary | ICD-10-CM | POA: Diagnosis not present

## 2019-09-13 DIAGNOSIS — N186 End stage renal disease: Secondary | ICD-10-CM | POA: Diagnosis not present

## 2019-09-16 DIAGNOSIS — Z992 Dependence on renal dialysis: Secondary | ICD-10-CM | POA: Diagnosis not present

## 2019-09-16 DIAGNOSIS — N186 End stage renal disease: Secondary | ICD-10-CM | POA: Diagnosis not present

## 2019-09-18 DIAGNOSIS — N186 End stage renal disease: Secondary | ICD-10-CM | POA: Diagnosis not present

## 2019-09-18 DIAGNOSIS — Z992 Dependence on renal dialysis: Secondary | ICD-10-CM | POA: Diagnosis not present

## 2019-09-20 DIAGNOSIS — N186 End stage renal disease: Secondary | ICD-10-CM | POA: Diagnosis not present

## 2019-09-20 DIAGNOSIS — Z992 Dependence on renal dialysis: Secondary | ICD-10-CM | POA: Diagnosis not present

## 2019-09-23 DIAGNOSIS — N186 End stage renal disease: Secondary | ICD-10-CM | POA: Diagnosis not present

## 2019-09-23 DIAGNOSIS — N2581 Secondary hyperparathyroidism of renal origin: Secondary | ICD-10-CM | POA: Diagnosis not present

## 2019-09-23 DIAGNOSIS — Z992 Dependence on renal dialysis: Secondary | ICD-10-CM | POA: Diagnosis not present

## 2019-09-25 DIAGNOSIS — Z992 Dependence on renal dialysis: Secondary | ICD-10-CM | POA: Diagnosis not present

## 2019-09-25 DIAGNOSIS — N186 End stage renal disease: Secondary | ICD-10-CM | POA: Diagnosis not present

## 2019-09-26 DIAGNOSIS — E119 Type 2 diabetes mellitus without complications: Secondary | ICD-10-CM | POA: Diagnosis not present

## 2019-09-26 DIAGNOSIS — I10 Essential (primary) hypertension: Secondary | ICD-10-CM | POA: Diagnosis not present

## 2019-09-26 DIAGNOSIS — J449 Chronic obstructive pulmonary disease, unspecified: Secondary | ICD-10-CM | POA: Diagnosis not present

## 2019-09-26 DIAGNOSIS — I509 Heart failure, unspecified: Secondary | ICD-10-CM | POA: Diagnosis not present

## 2019-09-27 DIAGNOSIS — Z992 Dependence on renal dialysis: Secondary | ICD-10-CM | POA: Diagnosis not present

## 2019-09-27 DIAGNOSIS — N186 End stage renal disease: Secondary | ICD-10-CM | POA: Diagnosis not present

## 2019-09-30 DIAGNOSIS — N186 End stage renal disease: Secondary | ICD-10-CM | POA: Diagnosis not present

## 2019-09-30 DIAGNOSIS — Z992 Dependence on renal dialysis: Secondary | ICD-10-CM | POA: Diagnosis not present

## 2019-10-02 DIAGNOSIS — N186 End stage renal disease: Secondary | ICD-10-CM | POA: Diagnosis not present

## 2019-10-02 DIAGNOSIS — Z992 Dependence on renal dialysis: Secondary | ICD-10-CM | POA: Diagnosis not present

## 2019-10-04 DIAGNOSIS — N186 End stage renal disease: Secondary | ICD-10-CM | POA: Diagnosis not present

## 2019-10-04 DIAGNOSIS — Z992 Dependence on renal dialysis: Secondary | ICD-10-CM | POA: Diagnosis not present

## 2019-10-07 DIAGNOSIS — N186 End stage renal disease: Secondary | ICD-10-CM | POA: Diagnosis not present

## 2019-10-07 DIAGNOSIS — Z992 Dependence on renal dialysis: Secondary | ICD-10-CM | POA: Diagnosis not present

## 2019-10-09 DIAGNOSIS — Z992 Dependence on renal dialysis: Secondary | ICD-10-CM | POA: Diagnosis not present

## 2019-10-09 DIAGNOSIS — N186 End stage renal disease: Secondary | ICD-10-CM | POA: Diagnosis not present

## 2019-10-11 DIAGNOSIS — N186 End stage renal disease: Secondary | ICD-10-CM | POA: Diagnosis not present

## 2019-10-11 DIAGNOSIS — Z992 Dependence on renal dialysis: Secondary | ICD-10-CM | POA: Diagnosis not present

## 2019-10-14 DIAGNOSIS — Z992 Dependence on renal dialysis: Secondary | ICD-10-CM | POA: Diagnosis not present

## 2019-10-14 DIAGNOSIS — N186 End stage renal disease: Secondary | ICD-10-CM | POA: Diagnosis not present

## 2019-10-16 DIAGNOSIS — N186 End stage renal disease: Secondary | ICD-10-CM | POA: Diagnosis not present

## 2019-10-16 DIAGNOSIS — Z992 Dependence on renal dialysis: Secondary | ICD-10-CM | POA: Diagnosis not present

## 2019-10-18 DIAGNOSIS — Z992 Dependence on renal dialysis: Secondary | ICD-10-CM | POA: Diagnosis not present

## 2019-10-18 DIAGNOSIS — N186 End stage renal disease: Secondary | ICD-10-CM | POA: Diagnosis not present

## 2019-10-21 DIAGNOSIS — Z992 Dependence on renal dialysis: Secondary | ICD-10-CM | POA: Diagnosis not present

## 2019-10-21 DIAGNOSIS — N186 End stage renal disease: Secondary | ICD-10-CM | POA: Diagnosis not present

## 2019-10-23 DIAGNOSIS — Z992 Dependence on renal dialysis: Secondary | ICD-10-CM | POA: Diagnosis not present

## 2019-10-23 DIAGNOSIS — N186 End stage renal disease: Secondary | ICD-10-CM | POA: Diagnosis not present

## 2019-10-25 DIAGNOSIS — N186 End stage renal disease: Secondary | ICD-10-CM | POA: Diagnosis not present

## 2019-10-25 DIAGNOSIS — Z992 Dependence on renal dialysis: Secondary | ICD-10-CM | POA: Diagnosis not present

## 2019-10-26 ENCOUNTER — Observation Stay (HOSPITAL_COMMUNITY)
Admission: EM | Admit: 2019-10-26 | Discharge: 2019-10-27 | Disposition: A | Discharge status: Home / Self Care | Payer: Medicare Other | Attending: Emergency Medicine | Admitting: Emergency Medicine

## 2019-10-26 ENCOUNTER — Emergency Department (HOSPITAL_COMMUNITY): Payer: Medicare Other

## 2019-10-26 ENCOUNTER — Encounter (HOSPITAL_COMMUNITY): Payer: Self-pay | Admitting: Emergency Medicine

## 2019-10-26 ENCOUNTER — Other Ambulatory Visit: Payer: Self-pay

## 2019-10-26 DIAGNOSIS — I4891 Unspecified atrial fibrillation: Secondary | ICD-10-CM | POA: Diagnosis not present

## 2019-10-26 DIAGNOSIS — D531 Other megaloblastic anemias, not elsewhere classified: Secondary | ICD-10-CM

## 2019-10-26 DIAGNOSIS — Z8673 Personal history of transient ischemic attack (TIA), and cerebral infarction without residual deficits: Secondary | ICD-10-CM | POA: Insufficient documentation

## 2019-10-26 DIAGNOSIS — Z87891 Personal history of nicotine dependence: Secondary | ICD-10-CM | POA: Insufficient documentation

## 2019-10-26 DIAGNOSIS — J449 Chronic obstructive pulmonary disease, unspecified: Secondary | ICD-10-CM | POA: Insufficient documentation

## 2019-10-26 DIAGNOSIS — I639 Cerebral infarction, unspecified: Secondary | ICD-10-CM | POA: Diagnosis not present

## 2019-10-26 DIAGNOSIS — Z20822 Contact with and (suspected) exposure to covid-19: Secondary | ICD-10-CM | POA: Diagnosis not present

## 2019-10-26 DIAGNOSIS — R778 Other specified abnormalities of plasma proteins: Secondary | ICD-10-CM

## 2019-10-26 DIAGNOSIS — I517 Cardiomegaly: Secondary | ICD-10-CM | POA: Diagnosis not present

## 2019-10-26 DIAGNOSIS — I700 Atherosclerosis of aorta: Secondary | ICD-10-CM | POA: Diagnosis not present

## 2019-10-26 DIAGNOSIS — J9601 Acute respiratory failure with hypoxia: Secondary | ICD-10-CM | POA: Diagnosis not present

## 2019-10-26 DIAGNOSIS — R079 Chest pain, unspecified: Secondary | ICD-10-CM | POA: Diagnosis not present

## 2019-10-26 DIAGNOSIS — I4729 Other ventricular tachycardia: Secondary | ICD-10-CM

## 2019-10-26 DIAGNOSIS — I5032 Chronic diastolic (congestive) heart failure: Secondary | ICD-10-CM | POA: Insufficient documentation

## 2019-10-26 DIAGNOSIS — Z79899 Other long term (current) drug therapy: Secondary | ICD-10-CM | POA: Insufficient documentation

## 2019-10-26 DIAGNOSIS — Z96651 Presence of right artificial knee joint: Secondary | ICD-10-CM | POA: Insufficient documentation

## 2019-10-26 DIAGNOSIS — I10 Essential (primary) hypertension: Secondary | ICD-10-CM

## 2019-10-26 DIAGNOSIS — R0602 Shortness of breath: Secondary | ICD-10-CM | POA: Diagnosis not present

## 2019-10-26 DIAGNOSIS — J9811 Atelectasis: Secondary | ICD-10-CM | POA: Diagnosis not present

## 2019-10-26 DIAGNOSIS — N186 End stage renal disease: Secondary | ICD-10-CM | POA: Diagnosis not present

## 2019-10-26 DIAGNOSIS — I482 Chronic atrial fibrillation, unspecified: Secondary | ICD-10-CM | POA: Diagnosis present

## 2019-10-26 DIAGNOSIS — I132 Hypertensive heart and chronic kidney disease with heart failure and with stage 5 chronic kidney disease, or end stage renal disease: Secondary | ICD-10-CM | POA: Insufficient documentation

## 2019-10-26 DIAGNOSIS — E1122 Type 2 diabetes mellitus with diabetic chronic kidney disease: Secondary | ICD-10-CM | POA: Insufficient documentation

## 2019-10-26 DIAGNOSIS — E785 Hyperlipidemia, unspecified: Secondary | ICD-10-CM

## 2019-10-26 DIAGNOSIS — M1990 Unspecified osteoarthritis, unspecified site: Secondary | ICD-10-CM | POA: Diagnosis not present

## 2019-10-26 DIAGNOSIS — J811 Chronic pulmonary edema: Secondary | ICD-10-CM | POA: Diagnosis not present

## 2019-10-26 DIAGNOSIS — J984 Other disorders of lung: Secondary | ICD-10-CM | POA: Diagnosis not present

## 2019-10-26 DIAGNOSIS — Z992 Dependence on renal dialysis: Secondary | ICD-10-CM | POA: Diagnosis not present

## 2019-10-26 DIAGNOSIS — I472 Ventricular tachycardia: Secondary | ICD-10-CM

## 2019-10-26 DIAGNOSIS — J438 Other emphysema: Secondary | ICD-10-CM

## 2019-10-26 DIAGNOSIS — I2510 Atherosclerotic heart disease of native coronary artery without angina pectoris: Secondary | ICD-10-CM | POA: Diagnosis not present

## 2019-10-26 LAB — CBC
HCT: 38.2 % (ref 36.0–46.0)
Hemoglobin: 11.8 g/dL — ABNORMAL LOW (ref 12.0–15.0)
MCH: 35.3 pg — ABNORMAL HIGH (ref 26.0–34.0)
MCHC: 30.9 g/dL (ref 30.0–36.0)
MCV: 114.4 fL — ABNORMAL HIGH (ref 80.0–100.0)
Platelets: 160 10*3/uL (ref 150–400)
RBC: 3.34 MIL/uL — ABNORMAL LOW (ref 3.87–5.11)
RDW: 14.5 % (ref 11.5–15.5)
WBC: 5.5 10*3/uL (ref 4.0–10.5)
nRBC: 0 % (ref 0.0–0.2)

## 2019-10-26 LAB — BASIC METABOLIC PANEL
Anion gap: 12 (ref 5–15)
BUN: 20 mg/dL (ref 8–23)
CO2: 30 mmol/L (ref 22–32)
Calcium: 9.6 mg/dL (ref 8.9–10.3)
Chloride: 94 mmol/L — ABNORMAL LOW (ref 98–111)
Creatinine, Ser: 4.09 mg/dL — ABNORMAL HIGH (ref 0.44–1.00)
GFR calc Af Amer: 11 mL/min — ABNORMAL LOW (ref >60)
GFR calc non Af Amer: 10 mL/min — ABNORMAL LOW (ref >60)
Glucose, Bld: 78 mg/dL (ref 70–99)
Potassium: 3.5 mmol/L (ref 3.5–5.1)
Sodium: 136 mmol/L (ref 135–145)

## 2019-10-26 LAB — TROPONIN I (HIGH SENSITIVITY)
Troponin I (High Sensitivity): 41 ng/L — ABNORMAL HIGH (ref <18)
Troponin I (High Sensitivity): 39 ng/L — ABNORMAL HIGH (ref <18)

## 2019-10-26 LAB — SARS CORONAVIRUS 2 BY RT PCR (HOSPITAL ORDER, PERFORMED IN ~~LOC~~ HOSPITAL LAB): SARS Coronavirus 2: NEGATIVE

## 2019-10-26 MED ORDER — SODIUM BICARBONATE 650 MG PO TABS
650.0000 mg | ORAL_TABLET | Freq: Two times a day (BID) | ORAL | Status: DC
  Administered 2019-10-26 – 2019-10-27 (×2): 650 mg via ORAL
  Filled 2019-10-26 (×8): qty 1

## 2019-10-26 MED ORDER — POLYETHYLENE GLYCOL 3350 17 G PO PACK
17.0000 g | PACK | Freq: Every day | ORAL | Status: DC | PRN
  Filled 2019-10-26: qty 1

## 2019-10-26 MED ORDER — PANTOPRAZOLE SODIUM 40 MG PO TBEC
40.0000 mg | DELAYED_RELEASE_TABLET | Freq: Every day | ORAL | Status: DC
  Administered 2019-10-26 – 2019-10-27 (×2): 40 mg via ORAL
  Filled 2019-10-26 (×2): qty 1

## 2019-10-26 MED ORDER — ATORVASTATIN CALCIUM 40 MG PO TABS
40.0000 mg | ORAL_TABLET | Freq: Every day | ORAL | Status: DC
  Administered 2019-10-26: 40 mg via ORAL
  Filled 2019-10-26: qty 1

## 2019-10-26 MED ORDER — SEVELAMER CARBONATE 800 MG PO TABS
800.0000 mg | ORAL_TABLET | Freq: Three times a day (TID) | ORAL | Status: DC
  Administered 2019-10-27 (×3): 800 mg via ORAL
  Filled 2019-10-26 (×11): qty 1

## 2019-10-26 MED ORDER — ALBUTEROL SULFATE HFA 108 (90 BASE) MCG/ACT IN AERS: 1.0000 | INHALATION_SPRAY | Freq: Four times a day (QID) | RESPIRATORY_TRACT | Status: DC | PRN

## 2019-10-26 MED ORDER — IPRATROPIUM-ALBUTEROL 0.5-2.5 (3) MG/3ML IN SOLN
3.0000 mL | Freq: Once | RESPIRATORY_TRACT | Status: AC
  Administered 2019-10-26: 3 mL via RESPIRATORY_TRACT
  Filled 2019-10-26: qty 3

## 2019-10-26 MED ORDER — MOMETASONE FURO-FORMOTEROL FUM 200-5 MCG/ACT IN AERO
2.0000 | INHALATION_SPRAY | Freq: Two times a day (BID) | RESPIRATORY_TRACT | Status: DC
  Administered 2019-10-27 (×2): 2 via RESPIRATORY_TRACT
  Filled 2019-10-26: qty 8.8

## 2019-10-26 MED ORDER — METOPROLOL TARTRATE 25 MG PO TABS
12.5000 mg | ORAL_TABLET | Freq: Two times a day (BID) | ORAL | Status: DC
  Administered 2019-10-26 – 2019-10-27 (×2): 12.5 mg via ORAL
  Filled 2019-10-26 (×2): qty 1

## 2019-10-26 MED ORDER — DILTIAZEM HCL ER 180 MG PO CP24
180.0000 mg | ORAL_CAPSULE | Freq: Every day | ORAL | Status: DC
  Administered 2019-10-27: 180 mg via ORAL
  Filled 2019-10-26 (×4): qty 1

## 2019-10-26 MED ORDER — ACETAMINOPHEN 650 MG RE SUPP: 650.0000 mg | Freq: Four times a day (QID) | RECTAL | Status: DC | PRN

## 2019-10-26 MED ORDER — APIXABAN 2.5 MG PO TABS
2.5000 mg | ORAL_TABLET | Freq: Two times a day (BID) | ORAL | Status: DC
  Administered 2019-10-26 – 2019-10-27 (×2): 2.5 mg via ORAL
  Filled 2019-10-26 (×2): qty 1

## 2019-10-26 MED ORDER — PREDNISONE 50 MG PO TABS
60.0000 mg | ORAL_TABLET | Freq: Once | ORAL | Status: AC
  Administered 2019-10-26: 60 mg via ORAL
  Filled 2019-10-26: qty 1

## 2019-10-26 MED ORDER — ALBUTEROL SULFATE HFA 108 (90 BASE) MCG/ACT IN AERS
2.0000 | INHALATION_SPRAY | Freq: Once | RESPIRATORY_TRACT | Status: AC
  Administered 2019-10-26: 2 via RESPIRATORY_TRACT
  Filled 2019-10-26: qty 6.7

## 2019-10-26 MED ORDER — IOHEXOL 350 MG/ML SOLN
100.0000 mL | Freq: Once | INTRAVENOUS | Status: AC | PRN
  Administered 2019-10-26: 100 mL via INTRAVENOUS

## 2019-10-26 MED ORDER — ACETAMINOPHEN 325 MG PO TABS: 650.0000 mg | ORAL_TABLET | Freq: Four times a day (QID) | ORAL | Status: DC | PRN

## 2019-10-26 NOTE — ED Notes (Signed)
Pt ambulated well with two person assist. O2 dropped to 89%.

## 2019-10-26 NOTE — ED Provider Notes (Signed)
[facility] EMERGENCY DEPARTMENT Provider Note   CSN: [id number] Arrival date & time: 10/26/19  1133     History Chief Complaint  Patient presents with  . Shortness of Breath    Jody [name] is a 79 y.o. female.  HPI    Pt is an 79 y/o female with a h/o CKD, COPD, HLD, HTN, NSVT, PAF, tachy-brady syndrome, CVA, T2DM, who presents to the ED today for eval of SOB that started 3 weeks ago. She states she had some chest pain this AM prior to taking her breathing treatment. It improved somewhat after that. She has had a cough for the last 3 weeks and says that the pain is worse after coughing.   Reports she had increased wheezing. States she does not have any increased BLE swelling. Denies fevers.   [place]yzes [place]/Sat in [place]  Past Medical History:  Diagnosis Date  . Anemia in chronic kidney disease (CODE) 06/05/2015  . Arthritis    "bad in my legs" (12/07/2014)  . Chronic back pain    "dr said my spine is crooked"  . Chronic bronchitis ([place])    "get it q yr" (12/07/2014)  . CKD stage 4 due to type 2 diabetes mellitus ([place]) 06/05/2015  . Complication of anesthesia    headache for 2 days after surgery in May 2019  . COPD (chronic obstructive pulmonary disease) ([place])   . Gait difficulty 09/02/2014  . Hypercholesterolemia   . Hypertension   . Neuropathy 2015   in legs  . NSVT (nonsustained ventricular tachycardia) ([place])   . PAF (paroxysmal atrial fibrillation) ([place]) 06/05/2015  . Sinus brady-tachy syndrome ([place])   . Stroke ([place]) 05/2014   denies residual on 12/07/2014  . Type II diabetes mellitus [facility])     Patient Active Problem List   Diagnosis Date Noted  . Chronic blood loss anemia 10/26/2017  . Arthritis 10/26/2017  . Acute on chronic blood loss anemia 10/26/2017  . Iron deficiency anemia 02/23/2017  . Chronic diastolic CHF (congestive heart failure) ([place]) 10/15/2016  . Elevated troponin 10/15/2016  . Type 2 diabetes mellitus with nephropathy ([place]) 10/15/2016  .  ESRD on dialysis ([place]) 10/15/2016  . NSTEMI (non-ST elevated myocardial infarction) ([place]) 10/03/2015  . Elevated LFTs 10/03/2015  . Encounter for therapeutic drug monitoring 07/29/2015  . COPD (chronic obstructive pulmonary disease) ([place]) 06/05/2015  . CKD stage 4 due to type 2 diabetes mellitus ([place]) 06/05/2015  . Anemia in chronic kidney disease, Stage IV 06/05/2015  . Acute on chronic diastolic CHF (congestive heart failure) ([place]) 06/05/2015  . Atrial fibrillation ([place]) 06/05/2015  . Atrial fibrillation with rapid ventricular response ([place]) 12/07/2014  . TIA (transient ischemic attack) 09/02/2014  . Gait difficulty 09/02/2014  . Other specified transient cerebral ischemias   . Stroke with cerebral ischemia ([place])   . Hemispheric carotid artery syndrome   . Essential hypertension   . Hyperlipidemia   . Type 2 diabetes mellitus with other circulatory complications ([place])   . CVA (cerebral infarction) 05/27/2014    Past Surgical History:  Procedure Laterality Date  . ABDOMINAL HYSTERECTOMY  ~ 1970  . APPENDECTOMY  ~ 1970  . AV FISTULA PLACEMENT Right 07/06/2017   Procedure: RIGHT RADIOCEPHALIC ARTERIOVENOUS FISTULA creation;  Surgeon: Conrad Frankton, MD;  Location: [place];  Service: Vascular;  Laterality: Right;  . BASCILIC VEIN TRANSPOSITION Right 09/17/2017   Procedure: SECOND STAGE BASILIC VEIN TRANSPOSITION RIGHT ARM;  Surgeon: Rosetta Posner, MD;  Location: [place];  Service: Vascular;  Laterality: Right;  . COLONOSCOPY N/A 10/28/2017   Procedure: COLONOSCOPY;  Surgeon: Rogene Houston, MD;  [facility];  Service: Endoscopy;  Laterality: N/A;  . ESOPHAGOGASTRODUODENOSCOPY (EGD) WITH PROPOFOL N/A 10/31/2017   Procedure: ESOPHAGOGASTRODUODENOSCOPY (EGD) WITH PROPOFOL;  Surgeon: Rogene Houston, MD;  [facility];  Service: Endoscopy;  Laterality: N/A;  . JOINT REPLACEMENT    . TOTAL KNEE ARTHROPLASTY Right ~ 1986  . TUMOR EXCISION  ~ 1970   "9# in my stomach"      OB History   No obstetric history on file.     Family History  Problem Relation Age of Onset  . Cancer Other   . Heart attack Brother   . Cancer Sister        breast  . Alcohol abuse Brother     Social History   Tobacco Use  . Smoking status: Former Smoker    Packs/day: 0.50    Years: 50.00    Pack years: 25.00    Types: Cigarettes    Start date: 03/07/1953    Quit date: 01/14/2004    Years since quitting: 15.7  . Smokeless tobacco: Never Used  . Tobacco comment: "quit smoking cigarettes  in ~ 2005"  Vaping Use  . Vaping Use: Never used  Substance Use Topics  . Alcohol use: No    Alcohol/week: 0.0 standard drinks    Comment: 10/242016 "quit drinking beer in ~ 1977"  . Drug use: No    Home Medications Prior to Admission medications   Medication Sig Start Date End Date Taking? Authorizing Provider  albuterol (PROVENTIL HFA;VENTOLIN HFA) 108 (90 BASE) MCG/ACT inhaler Inhale 1-2 puffs into the lungs every 6 (six) hours as needed for wheezing or shortness of breath. Reported on 03/01/2015    [provider]  apixaban (ELIQUIS) 2.5 MG TABS tablet Take 1 tablet (2.5 mg total) by mouth 2 (two) times daily. 04/16/18   Dunn, Nedra Hai, PA-C  atorvastatin (LIPITOR) 40 MG tablet Take 1 tablet by mouth at bedtime.  01/22/16   [provider]  diltiazem (DILACOR XR) 180 MG 24 hr capsule Take 180 mg by mouth daily.    [provider]  fluticasone-salmeterol (ADVAIR HFA) 230-21 MCG/ACT inhaler Inhale 2 puffs into the lungs 2 (two) times daily.    [provider]  gabapentin (NEURONTIN) 100 MG capsule  07/23/19   [provider]  hydrALAZINE (APRESOLINE) 100 MG tablet TAKE ONE (1) TABLET THREE (3) TIMES EACH DAY 07/16/17   Herminio Commons, MD  metoprolol tartrate (LOPRESSOR) 25 MG tablet Take 0.5 tablets (12.5 mg total) by mouth 2 (two) times daily. 10/18/16   Kathie Dike, MD  pantoprazole (PROTONIX) 40 MG tablet Take 1 tablet (40 mg total)  by mouth daily. 11/01/17   Isaac Bliss, Rayford Halsted, MD  polyethylene glycol [facility] / Floria Raveling) packet Take 17 g by mouth daily as needed (constipation).    [provider]  sevelamer carbonate (RENVELA) 800 MG tablet Take by mouth. 07/22/19   [provider]  sodium bicarbonate 650 MG tablet Take 1 tablet (650 mg total) by mouth 2 (two) times daily. 10/17/16   Orson Eva, MD  TRESIBA FLEXTOUCH 100 UNIT/ML FlexTouch Pen Inject into the skin. 07/23/19   [provider]    Allergies    Codeine  Review of Systems   Review of Systems  Constitutional: Negative for fever.  HENT: Negative for ear pain and sore throat.   Eyes: Negative  for visual disturbance.  Respiratory: Positive for cough, shortness of breath and wheezing.   Cardiovascular: Positive for chest pain. Negative for leg swelling.  Gastrointestinal: Negative for abdominal pain, constipation, diarrhea, nausea and vomiting.  Genitourinary: Negative for dysuria and hematuria.  Musculoskeletal: Negative for back pain.  Skin: Negative for rash.  Neurological: Negative for headaches.  All other systems reviewed and are negative.   Physical Exam Updated Vital Signs BP (!) 150/70   Pulse 65   Temp 98.5 F (36.9 C) (Oral)   Resp (!) 25   Ht 5\' 7"  (1.702 m)   Wt 96.6 kg   SpO2 97%   BMI 33.36 kg/m   Physical Exam Vitals and nursing note reviewed.  Constitutional:      General: She is not in acute distress.    Appearance: She is well-developed.  HENT:     Head: Normocephalic and atraumatic.  Eyes:     Conjunctiva/sclera: Conjunctivae normal.  Cardiovascular:     Rate and Rhythm: Normal rate and regular rhythm.     Heart sounds: No murmur heard.   Pulmonary:     Effort: Pulmonary effort is normal. No respiratory distress.     Breath sounds: Examination of the right-lower field reveals decreased breath sounds and rales. Examination of the left-lower field reveals decreased breath sounds and rales.  Decreased breath sounds and rales present.     Comments: Speaking in full sentences, no tachypnea Chest:     Chest wall: Tenderness (anterior chest wall) present.  Abdominal:     Palpations: Abdomen is soft.     Tenderness: There is no abdominal tenderness. There is no guarding or rebound.  Musculoskeletal:     Cervical back: Neck supple.  Skin:    General: Skin is warm and dry.  Neurological:     Mental Status: She is alert.     ED Results / Procedures / Treatments   Labs (all labs ordered are listed, but only abnormal results are displayed) Labs Reviewed  BASIC METABOLIC PANEL - Abnormal; Notable for the following components:      Result Value   Chloride 94 (*)    Creatinine, Ser 4.09 (*)    GFR calc non Af Amer 10 (*)    GFR calc Af Amer 11 (*)    All other components within normal limits  CBC - Abnormal; Notable for the following components:   RBC 3.34 (*)    Hemoglobin 11.8 (*)    MCV 114.4 (*)    MCH 35.3 (*)    All other components within normal limits  TROPONIN I (HIGH SENSITIVITY) - Abnormal; Notable for the following components:   Troponin I (High Sensitivity) 41 (*)    All other components within normal limits  TROPONIN I (HIGH SENSITIVITY) - Abnormal; Notable for the following components:   Troponin I (High Sensitivity) 39 (*)    All other components within normal limits  SARS CORONAVIRUS 2 BY RT PCR [facility] ORDER, [place] LAB)    EKG EKG Interpretation  Date/Time:  Sunday October 26 2019 12:10:27 EDT Ventricular Rate:  94 PR Interval:    QRS Duration: 84 QT Interval:  370 QTC Calculation: 462 R Axis:   92 Text Interpretation: Atrial fibrillation Rightward axis Septal infarct , age undetermined ST & T wave abnormality, consider inferolateral ischemia Abnormal ECG No significant change since last tracing 09 August 2018 Reconfirmed by Dorie Rank [id number]) on 10/26/2019 9:07:09 PM   Radiology CT Angio Chest PE  W and/or Wo  Contrast  Result Date: 10/26/2019 CLINICAL DATA:  Suspected pulmonary embolism EXAM: CT ANGIOGRAPHY CHEST WITH CONTRAST TECHNIQUE: Multidetector CT imaging of the chest was performed using the standard protocol during bolus administration of intravenous contrast. Multiplanar CT image reconstructions and MIPs were obtained to evaluate the vascular anatomy. CONTRAST:  173mL OMNIPAQUE IOHEXOL 350 MG/ML SOLN COMPARISON:  October 06, 2018 FINDINGS: Cardiovascular: Aorta with calcified and noncalcified atheromatous plaque. No aneurysmal dilation of the thoracic aorta. Cardiac enlargement, particularly LEFT atrium and RIGHT heart with mitral annular calcifications. Three-vessel coronary artery disease, extensive coronary calcifications particularly on the LEFT. No pericardial effusion. Central pulmonary arteries with engorgement to approximately 3.6 cm. Pulmonary arterial assessment limited by bolus timing and some motion artifact. No central or lobar level pulmonary embolism Mediastinum/Nodes: 11 mm AP window lymph node previously 9 mm. No thoracic inlet adenopathy. No axillary lymphadenopathy. Scattered small nodes elsewhere in the chest none displaying frank pathologic enlargement by CT criteria. Lungs/Pleura: Mild septal thickening. Tiny pulmonary nodules in the LEFT upper lobe unchanged from previous exam. Basilar atelectasis. Mild RIGHT basilar scarring and bronchiectasis. Upper Abdomen: Incidental imaging of upper abdominal contents without acute process. Cortical scarring of the kidneys bilaterally, not well characterized on today's study and incompletely imaged. Gastric wall thickening may be present. The stomach however remains under distended as on many prior exams. Particularly in the gastric cardia on today's study more so than on other exams and in other areas of the stomach. Musculoskeletal: No acute musculoskeletal process. Review of the MIP images confirms the above findings. IMPRESSION: 1. Pulmonary  arterial assessment limited by bolus timing and some motion artifact. No central or lobar level pulmonary embolism. 2. Marked cardiac enlargement as discussed. 3. Mild septal thickening slightly more pronounced at the lung apices may represent early edema. Correlate with any clinical signs or laboratory evidence of heart failure. 4. Gastric wall thickening suggested particularly in the gastric cardia on today's study more so than on other exams and in other areas of the stomach. Correlate with any symptoms of gastritis or peptic ulcer disease. Consider endoscopic follow-up given persistent thickening suggested on previous exams though under distension as certainly been present on prior studies. 5. Pulmonary arterial engorgement may reflect underlying pulmonary arterial hypertension. 6. Tiny pulmonary nodules in the LEFT upper lobe unchanged from previous exam. 7. Aortic atherosclerosis. Aortic Atherosclerosis (ICD10-I70.0). Electronically Signed   By: Zetta Bills M.D.   On: 10/26/2019 20:12   DG Chest Port 1 View  Result Date: 10/26/2019 CLINICAL DATA:  Short of breath and intermittent chest pain EXAM: PORTABLE CHEST 1 VIEW COMPARISON:  Prior chest x-ray 08/09/2018 FINDINGS: Stable cardiomegaly. Slightly increased pulmonary vascular congestion without overt edema. Atherosclerotic calcifications are again noted in the transverse aorta. No focal airspace consolidation, pleural effusion or pneumothorax. No acute osseous abnormality. IMPRESSION: 1. Cardiomegaly and pulmonary vascular congestion without overt edema. 2. Aortic atherosclerosis. Electronically Signed   By: Jacqulynn Cadet M.D.   On: 10/26/2019 12:36    Procedures Procedures (including critical care time)  Medications Ordered in ED Medications  albuterol (VENTOLIN HFA) 108 (90 Base) MCG/ACT inhaler 2 puff (2 puffs Inhalation Given 10/26/19 1620)  predniSONE (DELTASONE) tablet 60 mg (60 mg Oral Given 10/26/19 1654)  ipratropium-albuterol  (DUONEB) 0.5-2.5 (3) MG/3ML nebulizer solution 3 mL (3 mLs Nebulization Given 10/26/19 1856)  iohexol (OMNIPAQUE) 350 MG/ML injection 100 mL (100 mLs Intravenous Contrast Given 10/26/19 1912)    ED Course  I have reviewed the triage vital  signs and the nursing notes.  Pertinent labs & imaging results that were available during my care of the patient were reviewed by me and considered in my medical decision making (see chart for details).    MDM Rules/Calculators/A&P                          79 y/o F presenting for eval of SOB x3 weeks. Hx copd, dialysis. Has not missed any sessions.   Reviewed/interpreted labs CBC with no leukocytosis, mild anemia BMP at baseline Trops marginally elevated - likely 2/2 ESRD, no current chest pain and chest pain seems to be related to cough rather than ACS.  COVID negative  EKG with Atrial fibrillation Rightward axis Septal infarct , age undetermined ST & T wave abnormality, consider inferolateral ischemia Abnormal ECG   CXR reviewed/interpreted - 1. Cardiomegaly and pulmonary vascular congestion without overt edema. 2. Aortic atherosclerosis  CTA chest - 1. Pulmonary arterial assessment limited by bolus timing and some motion artifact. No central or lobar level pulmonary embolism. 2. Marked cardiac enlargement as discussed. 3. Mild septal thickening slightly more pronounced at the lung apices may represent early edema. Correlate with any clinical signs or laboratory evidence of heart failure. 4. Gastric wall thickening suggested particularly in the gastric cardia on today's study more so than on other exams and in other areas of the stomach. Correlate with any symptoms of gastritis or peptic ulcer disease. Consider endoscopic follow-up given persistent thickening suggested on previous exams though under distension as certainly been present on prior studies. 5. Pulmonary arterial engorgement may reflect underlying pulmonary arterial hypertension. 6. Tiny  pulmonary nodules in the LEFT upper lobe unchanged from previous exam. 7. Aortic atherosclerosis. Aortic Atherosclerosis   Pt was ambulated with pulse ox and sats dropped to 89% on RA.   Pt with SOB who is hypoxic when ambulation. This is likely multifactorial in setting of COPD, pulm vascular congestion and pulm HTN on CTA. Will admit for further tx.  9:07 PM CONSULT with hospitalist who accepts patient for admission.   Final Clinical Impression(s) / ED Diagnoses Final diagnoses:  SOB (shortness of breath)    Rx / DC Orders ED Discharge Orders    None       Bishop Dublin 10/26/19 2113    Dorie Rank, MD 10/28/19 [phone]

## 2019-10-26 NOTE — ED Triage Notes (Signed)
Pt c/o SOB and intermittent chest pain x 2 weeks. Hx of COPD and asthma. Pt also is on dialysis and has not missed any appointments.

## 2019-10-26 NOTE — H&P (Signed)
History and Physical  Jody [name] OJJ:[id number] DOB: February 24, 1940 DOA: 10/26/2019  Referring physician: Rodney Booze, PA-C PCP: Jody Blitz, MD  Outpatient Specialists: Jody Commons, MD Patient coming from: Home  Chief Complaint: Shortness of breath  HPI: Jody [name] is a 79 y.o. female with medical history significant for ESRD on HD (TTS), COPD (not on home oxygen), hyperlipidemia, hypertension, NSVT, paroxysmal A. fib, chronic diastolic heart failure, H3ZJ, diffuse arthritis and CVA who presents to the emergency department accompanied by niece at bedside due to 3-week onset of increasing shortness of breath.  She states that shortness of breath is worse at night when she usually starts to cough to the extent that she feels an irritation to her throat and is usually associated with chest soreness from coughing.  She complained of some chest pain this morning prior to taking her breathing treatment which improved after the breathing treatment.  Patient states that she she does not get much physical activity due to diffuse arthritis and neuropathy, she lives alone, ambulates with a walker and has a nurse aide that comes to take care of her daily, shortness of breath was usually worse when she tries to ambulate.  Patient states that she has been compliant with her dialysis with last dialysis being yesterday (9/11).  She denies fever, chills, nausea, vomiting or abdominal pain.   ED Course: In the emergency department, patient was tachypneic and was hypoxic on ambulation.  Work-up in the ED showed megaloblastic anemia, PF.creatinine 20/4.09, troponin I 41>39, SARS coronavirus was negative.  CT angiography of chest rule out pulmonary embolism and pulmonary arterial engorgement which may reflect underlying pulmonary arterial hypertension.  Chest x-ray showed cardiomegaly and pulmonary vascular congestion without overt edema. Breathing treatment was provided, prednisone was given and  hospitalist was asked to admit him for further evaluation and management.  Review of Systems: Constitutional: Negative for chills and fever.  HENT: Negative for ear pain and sore throat.   Eyes: Negative for pain and visual disturbance.  Respiratory: Positive for cough,  wheezing and shortness of breath.   Cardiovascular: Positive for chest pain (related to cough).    Gastrointestinal: Negative for abdominal pain and vomiting.  Endocrine: Negative for polyphagia and polyuria.  Genitourinary: Negative for decreased urine volume, dysuria Musculoskeletal: Negative for arthralgias and back pain.  Skin: Negative for color change and rash.  Allergic/Immunologic: Negative for immunocompromised state.  Neurological: Negative for tremors, syncope, speech difficulty, weakness, light-headedness and headaches.  Hematological: Does not bruise/bleed easily.  All other systems reviewed and are negative  Past Medical History:  Diagnosis Date  . Anemia in chronic kidney disease (CODE) 06/05/2015  . Arthritis    "bad in my legs" (12/07/2014)  . Chronic back pain    "dr said my spine is crooked"  . Chronic bronchitis ([place])    "get it q yr" (12/07/2014)  . CKD stage 4 due to type 2 diabetes mellitus ([place]) 06/05/2015  . Complication of anesthesia    headache for 2 days after surgery in May 2019  . COPD (chronic obstructive pulmonary disease) ([place])   . Gait difficulty 09/02/2014  . Hypercholesterolemia   . Hypertension   . Neuropathy 2015   in legs  . NSVT (nonsustained ventricular tachycardia) ([place])   . PAF (paroxysmal atrial fibrillation) ([place]) 06/05/2015  . Sinus brady-tachy syndrome ([place])   . Stroke ([place]) 05/2014   denies residual on 12/07/2014  . Type II diabetes mellitus ([place])    Past Surgical History:  Procedure  Laterality Date  . ABDOMINAL HYSTERECTOMY  ~ 1970  . APPENDECTOMY  ~ 1970  . AV FISTULA PLACEMENT Right 07/06/2017   Procedure: RIGHT RADIOCEPHALIC ARTERIOVENOUS FISTULA creation;   Surgeon: Conrad Bailey, MD;  Location: [facility];  Service: Vascular;  Laterality: Right;  . BASCILIC VEIN TRANSPOSITION Right 09/17/2017   Procedure: SECOND STAGE BASILIC VEIN TRANSPOSITION RIGHT ARM;  Surgeon: Rosetta Posner, MD;  Location: [facility];  Service: Vascular;  Laterality: Right;  . COLONOSCOPY N/A 10/28/2017   Procedure: COLONOSCOPY;  Surgeon: Rogene Houston, MD;  [facility];  Service: Endoscopy;  Laterality: N/A;  . ESOPHAGOGASTRODUODENOSCOPY (EGD) WITH PROPOFOL N/A 10/31/2017   Procedure: ESOPHAGOGASTRODUODENOSCOPY (EGD) WITH PROPOFOL;  Surgeon: Rogene Houston, MD;  [facility];  Service: Endoscopy;  Laterality: N/A;  . JOINT REPLACEMENT    . TOTAL KNEE ARTHROPLASTY Right ~ 1986  . TUMOR EXCISION  ~ 1970   "9# in my stomach"    Social History:  reports that she quit smoking about 15 years ago. Her smoking use included cigarettes. She started smoking about 66 years ago. She has a 25.00 pack-year smoking history. She has never used smokeless tobacco. She reports that she does not drink alcohol and does not use drugs.   Allergies  Allergen Reactions  . Codeine Nausea And Vomiting    Family History  Problem Relation Age of Onset  . Cancer Other   . Heart attack Brother   . Cancer Sister        breast  . Alcohol abuse Brother     Prior to Admission medications   Medication Sig Start Date End Date Taking? Authorizing Provider  albuterol (PROVENTIL HFA;VENTOLIN HFA) 108 (90 BASE) MCG/ACT inhaler Inhale 1-2 puffs into the lungs every 6 (six) hours as needed for wheezing or shortness of breath. Reported on 03/01/2015    [provider]  apixaban (ELIQUIS) 2.5 MG TABS tablet Take 1 tablet (2.5 mg total) by mouth 2 (two) times daily. 04/16/18   Dunn, Nedra Hai, PA-C  atorvastatin (LIPITOR) 40 MG tablet Take 1 tablet by mouth at bedtime.  01/22/16   [provider]  diltiazem (DILACOR XR) 180 MG 24 hr capsule Take 180 mg by mouth daily.    [provider]  fluticasone-salmeterol (ADVAIR HFA) 230-21 MCG/ACT inhaler Inhale 2 puffs into the lungs 2 (two) times daily.    [provider]  gabapentin (NEURONTIN) 100 MG capsule  07/23/19   [provider]  hydrALAZINE (APRESOLINE) 100 MG tablet TAKE ONE (1) TABLET THREE (3) TIMES EACH DAY 07/16/17   Jody Commons, MD  metoprolol tartrate (LOPRESSOR) 25 MG tablet Take 0.5 tablets (12.5 mg total) by mouth 2 (two) times daily. 10/18/16   Kathie Dike, MD  pantoprazole (PROTONIX) 40 MG tablet Take 1 tablet (40 mg total) by mouth daily. 11/01/17   Isaac Bliss, Rayford Halsted, MD  polyethylene glycol [facility] / Floria Raveling) packet Take 17 g by mouth daily as needed (constipation).    [provider]  sevelamer carbonate (RENVELA) 800 MG tablet Take by mouth. 07/22/19   [provider]  sodium bicarbonate 650 MG tablet Take 1 tablet (650 mg total) by mouth 2 (two) times daily. 10/17/16   Orson Eva, MD  TRESIBA FLEXTOUCH 100 UNIT/ML FlexTouch Pen Inject into the skin. 07/23/19   [provider]    Physical Exam: BP (!) 135/96   Pulse 61   Temp 98.5 F (36.9 C) (Oral)  Resp (!) 25   Ht 5\' 7"  (1.702 m)   Wt 96.6 kg   SpO2 91%   BMI 33.36 kg/m   . General: 79 y.o. year-old female well developed well nourished in no acute distress.  Alert and oriented x3. [name] HEENT: Normocephalic, atraumatic . Neck: Supple, trachea medial . Cardiovascular: Irregularly irregular rate and rhythm with no rubs or gallops.  Reproducible chest wall tenderness.  No thyromegaly or JVD noted.   [name] Respiratory: Bilateral diffuse mild expiratory wheezing and mild crackles bilaterally in lower lobes.   . Abdomen: Soft nontender nondistended with normal bowel sounds x4 quadrants. . Muskuloskeletal: No cyanosis, clubbing or edema noted bilaterally . Neuro: CN II-XII intact, strength, sensation, reflexes . Skin: No ulcerative lesions noted or rashes . Psychiatry: Judgement and  insight appear normal. Mood is appropriate for condition and setting          Labs on Admission:  Basic Metabolic Panel: Recent Labs  Lab 10/26/19 1313  NA 136  K 3.5  CL 94*  CO2 30  GLUCOSE 78  BUN 20  CREATININE 4.09*  CALCIUM 9.6   Liver Function Tests: No results for input(s): AST, ALT, ALKPHOS, BILITOT, PROT, ALBUMIN in the last 168 hours. No results for input(s): LIPASE, AMYLASE in the last 168 hours. No results for input(s): AMMONIA in the last 168 hours. CBC: Recent Labs  Lab 10/26/19 1313  WBC 5.5  HGB 11.8*  HCT 38.2  MCV 114.4*  PLT 160   Cardiac Enzymes: No results for input(s): CKTOTAL, CKMB, CKMBINDEX, TROPONINI in the last 168 hours.  BNP (last 3 results) No results for input(s): BNP in the last 8760 hours.  ProBNP (last 3 results) No results for input(s): PROBNP in the last 8760 hours.  CBG: No results for input(s): GLUCAP in the last 168 hours.  Radiological Exams on Admission: CT Angio Chest PE W and/or Wo Contrast  Result Date: 10/26/2019 CLINICAL DATA:  Suspected pulmonary embolism EXAM: CT ANGIOGRAPHY CHEST WITH CONTRAST TECHNIQUE: Multidetector CT imaging of the chest was performed using the standard protocol during bolus administration of intravenous contrast. Multiplanar CT image reconstructions and MIPs were obtained to evaluate the vascular anatomy. CONTRAST:  18mL OMNIPAQUE IOHEXOL 350 MG/ML SOLN COMPARISON:  October 06, 2018 FINDINGS: Cardiovascular: Aorta with calcified and noncalcified atheromatous plaque. No aneurysmal dilation of the thoracic aorta. Cardiac enlargement, particularly LEFT atrium and RIGHT heart with mitral annular calcifications. Three-vessel coronary artery disease, extensive coronary calcifications particularly on the LEFT. No pericardial effusion. Central pulmonary arteries with engorgement to approximately 3.6 cm. Pulmonary arterial assessment limited by bolus timing and some motion artifact. No central or lobar level  pulmonary embolism Mediastinum/Nodes: 11 mm AP window lymph node previously 9 mm. No thoracic inlet adenopathy. No axillary lymphadenopathy. Scattered small nodes elsewhere in the chest none displaying Jody pathologic enlargement by CT criteria. Lungs/Pleura: Mild septal thickening. Tiny pulmonary nodules in the LEFT upper lobe unchanged from previous exam. Basilar atelectasis. Mild RIGHT basilar scarring and bronchiectasis. Upper Abdomen: Incidental imaging of upper abdominal contents without acute process. Cortical scarring of the kidneys bilaterally, not well characterized on today's study and incompletely imaged. Gastric wall thickening may be present. The stomach however remains under distended as on many prior exams. Particularly in the gastric cardia on today's study more so than on other exams and in other areas of the stomach. Musculoskeletal: No acute musculoskeletal process. Review of the MIP images confirms the above findings. IMPRESSION: 1. Pulmonary arterial assessment limited by bolus  timing and some motion artifact. No central or lobar level pulmonary embolism. 2. Marked cardiac enlargement as discussed. 3. Mild septal thickening slightly more pronounced at the lung apices may represent early edema. Correlate with any clinical signs or laboratory evidence of heart failure. 4. Gastric wall thickening suggested particularly in the gastric cardia on today's study more so than on other exams and in other areas of the stomach. Correlate with any symptoms of gastritis or peptic ulcer disease. Consider endoscopic follow-up given persistent thickening suggested on previous exams though under distension as certainly been present on prior studies. 5. Pulmonary arterial engorgement may reflect underlying pulmonary arterial hypertension. 6. Tiny pulmonary nodules in the LEFT upper lobe unchanged from previous exam. 7. Aortic atherosclerosis. Aortic Atherosclerosis (ICD10-I70.0). Electronically Signed   By:  Zetta Bills M.D.   On: 10/26/2019 20:12   DG Chest Port 1 View  Result Date: 10/26/2019 CLINICAL DATA:  Short of breath and intermittent chest pain EXAM: PORTABLE CHEST 1 VIEW COMPARISON:  Prior chest x-ray 08/09/2018 FINDINGS: Stable cardiomegaly. Slightly increased pulmonary vascular congestion without overt edema. Atherosclerotic calcifications are again noted in the transverse aorta. No focal airspace consolidation, pleural effusion or pneumothorax. No acute osseous abnormality. IMPRESSION: 1. Cardiomegaly and pulmonary vascular congestion without overt edema. 2. Aortic atherosclerosis. Electronically Signed   By: Jacqulynn Cadet M.D.   On: 10/26/2019 12:36    EKG: I independently viewed the EKG done and my findings are as followed: A. fib with rate control and ST depression in inferior leads.  Assessment/Plan Present on Admission: . Acute respiratory failure with hypoxia ([place]) . Arthritis . Cerebral infarction ([place]) . Essential hypertension . Hyperlipidemia . Atrial fibrillation ([place]) . Elevated troponin  Principal Problem:   Acute respiratory failure with hypoxia (H[facility]) Active Problems:   Cerebral infarction [facility])   Essential hypertension   Hyperlipidemia   Atrial fibrillation ([facility])   Elevated troponin   ESRD on dialysis (H[facility])   Arthritis   Megaloblastic anemia  Acute respiratory failure possibly secondary to multifactorial including mild exhibition of COPD R/O acute on diastolic CHF Patient was noted to be hypoxic mostly on exertion, she does have history of inactivity due to neuropathy and diffuse arthritis in which she usually have exertional dyspnea. However, mild diffuse expiratory wheezing was heard on auscultation Chest x-ray shows cardiomegaly and pulmonary vascular congestion without overt edema Continue duo nebs, Mucinex, Solu-Medrol, azithromycin. Continue Protonix to prevent steroid-induced ulcer Continue incentive spirometry and flutter valve Continue  supplemental oxygen to maintain O2 sat > 92% with plan to wean patient off oxygen as tolerated Echocardiogram will be done in the morning  Elevated troponin possibly secondary to type II demand ischemia Troponin 41>39, chest pain which was reproducible and related to cough has since resolved Troponin already trended down Continue to monitor patient and treat accordingly  Anemia of chronic disease/megaloblastic anemia Patient has a history of ESRD on hemodialysis Vitamin B12/folate level will be checked due to elevated MCV  ESRD on HD (TTS) Last dialysis was on Saturday (9/11) Nephrology will be consulted for maintenance dialysis  Essential hypertension  Continue diltiazem and metoprolol per home regimen  Hyperlipidemia Continue Lipitor per home regimen  Atrial fibrillation Continue diltiazem and Eliquis per home regimen  Arthritis Continue Tylenol  History of stroke Continue Lipitor and Eliquis per home regimen  Chronic diastolic heart failure  Stable , volume usually managed by hemodialysis.  History of nonsustained ventricular tachycardia  Continue Lopressor  DVT prophylaxis: Eliquis  Code Status: Full code  Family Communication: Niece at bedside (all questions answered total traction)  Disposition Plan:  Patient is from:                        home Anticipated DC to:                   SNF or family members home Anticipated DC date:               2-3 days Anticipated DC barriers:         Patient with acute respiratory with hypoxia currently requiring supplemental oxygen and treatment for COPD exacerbation.    Consults called: Nephrology  Admission status: Inpatient    Bernadette Hoit MD [facility]  If 7PM-7AM, please contact night-coverage [email or web address] Password [facility]  10/26/2019, 11:40 PM

## 2019-10-27 DIAGNOSIS — I472 Ventricular tachycardia: Secondary | ICD-10-CM

## 2019-10-27 DIAGNOSIS — I4729 Other ventricular tachycardia: Secondary | ICD-10-CM

## 2019-10-27 DIAGNOSIS — J9601 Acute respiratory failure with hypoxia: Secondary | ICD-10-CM | POA: Diagnosis not present

## 2019-10-27 LAB — CBC
HCT: 33.4 % — ABNORMAL LOW (ref 36.0–46.0)
Hemoglobin: 10.3 g/dL — ABNORMAL LOW (ref 12.0–15.0)
MCH: 35 pg — ABNORMAL HIGH (ref 26.0–34.0)
MCHC: 30.8 g/dL (ref 30.0–36.0)
MCV: 113.6 fL — ABNORMAL HIGH (ref 80.0–100.0)
Platelets: 151 10*3/uL (ref 150–400)
RBC: 2.94 MIL/uL — ABNORMAL LOW (ref 3.87–5.11)
RDW: 14.4 % (ref 11.5–15.5)
WBC: 6.6 10*3/uL (ref 4.0–10.5)
nRBC: 0 % (ref 0.0–0.2)

## 2019-10-27 LAB — COMPREHENSIVE METABOLIC PANEL
ALT: 24 U/L (ref 0–44)
AST: 24 U/L (ref 15–41)
Albumin: 3.5 g/dL (ref 3.5–5.0)
Alkaline Phosphatase: 59 U/L (ref 38–126)
Anion gap: 13 (ref 5–15)
BUN: 26 mg/dL — ABNORMAL HIGH (ref 8–23)
CO2: 28 mmol/L (ref 22–32)
Calcium: 9 mg/dL (ref 8.9–10.3)
Chloride: 93 mmol/L — ABNORMAL LOW (ref 98–111)
Creatinine, Ser: 4.77 mg/dL — ABNORMAL HIGH (ref 0.44–1.00)
GFR calc Af Amer: 9 mL/min — ABNORMAL LOW (ref >60)
GFR calc non Af Amer: 8 mL/min — ABNORMAL LOW (ref >60)
Glucose, Bld: 168 mg/dL — ABNORMAL HIGH (ref 70–99)
Potassium: 3.7 mmol/L (ref 3.5–5.1)
Sodium: 134 mmol/L — ABNORMAL LOW (ref 135–145)
Total Bilirubin: 1.1 mg/dL (ref 0.3–1.2)
Total Protein: 6.7 g/dL (ref 6.5–8.1)

## 2019-10-27 LAB — FOLATE: Folate: 10.1 ng/mL (ref >5.9)

## 2019-10-27 LAB — VITAMIN B12: Vitamin B-12: 481 pg/mL (ref 180–914)

## 2019-10-27 LAB — PROTIME-INR
INR: 1.2 (ref 0.8–1.2)
Prothrombin Time: 14.3 seconds (ref 11.4–15.2)

## 2019-10-27 LAB — APTT: aPTT: 35 seconds (ref 24–36)

## 2019-10-27 MED ORDER — AZITHROMYCIN 250 MG PO TABS: 250.0000 mg | ORAL_TABLET | Freq: Every day | ORAL | 0 refills | Status: AC

## 2019-10-27 MED ORDER — PREDNISONE 20 MG PO TABS: 40.0000 mg | ORAL_TABLET | Freq: Every day | ORAL | 0 refills | Status: AC

## 2019-10-27 MED ORDER — AZITHROMYCIN 250 MG PO TABS
500.0000 mg | ORAL_TABLET | Freq: Every day | ORAL | Status: AC
  Administered 2019-10-27: 500 mg via ORAL
  Filled 2019-10-27: qty 2

## 2019-10-27 MED ORDER — IPRATROPIUM-ALBUTEROL 0.5-2.5 (3) MG/3ML IN SOLN
3.0000 mL | Freq: Four times a day (QID) | RESPIRATORY_TRACT | Status: DC
  Administered 2019-10-27 (×3): 3 mL via RESPIRATORY_TRACT
  Filled 2019-10-27 (×3): qty 3

## 2019-10-27 MED ORDER — DM-GUAIFENESIN ER 30-600 MG PO TB12
1.0000 | ORAL_TABLET | Freq: Two times a day (BID) | ORAL | Status: DC
  Administered 2019-10-27 (×2): 1 via ORAL
  Filled 2019-10-27 (×2): qty 1

## 2019-10-27 MED ORDER — IPRATROPIUM-ALBUTEROL 0.5-2.5 (3) MG/3ML IN SOLN: 3.0000 mL | RESPIRATORY_TRACT | Status: DC | PRN

## 2019-10-27 MED ORDER — METHYLPREDNISOLONE SODIUM SUCC 40 MG IJ SOLR
40.0000 mg | Freq: Two times a day (BID) | INTRAMUSCULAR | Status: DC
  Administered 2019-10-27 (×2): 40 mg via INTRAVENOUS
  Filled 2019-10-27 (×2): qty 1

## 2019-10-27 MED ORDER — AZITHROMYCIN 250 MG PO TABS: 250.0000 mg | ORAL_TABLET | Freq: Every day | ORAL | Status: DC

## 2019-10-27 MED ORDER — DM-GUAIFENESIN ER 30-600 MG PO TB12: 1.0000 | ORAL_TABLET | Freq: Two times a day (BID) | ORAL | 0 refills | Status: AC

## 2019-10-27 NOTE — Discharge Summary (Signed)
Physician Discharge Summary  Helane Briceno IWP:[id number] DOB: [birth year] DOA: 10/26/2019  PCP: Monico Blitz, MD  Admit date: 10/26/2019  Discharge date: 10/27/2019  Admitted From:Home  Disposition:  Home  Recommendations for Outpatient Follow-up:  1. Follow up with PCP in 1-2 weeks 2. Please obtain BMP/CBC in one week 3. Continue on breathing treatments at home as needed for shortness of breath or wheezing 4. Continue on prednisone 40 mg daily for 5 days as prescribed 5. Continue on azithromycin as prescribed 6. Continue on antitussives as needed 7. Continue on oxygen supplementation as prescribed 8. Follow-up with pulmonology as recommended-will be scheduled  Home Health: None  Equipment/Devices: Home 2 L nasal cannula oxygen  Discharge Condition: Stable  CODE STATUS: Full  Diet recommendation: Heart Healthy/renal  Brief/Interim Summary: Per HPI: Jody [name] is a 79 y.o. female with medical history significant for ESRD on HD (TTS), COPD (not on home oxygen), hyperlipidemia, hypertension, NSVT, paroxysmal A. fib, chronic diastolic heart failure, N0NL, diffuse arthritis and CVA who presents to the emergency department accompanied by niece at bedside due to 3-week onset of increasing shortness of breath.  She states that shortness of breath is worse at night when she usually starts to cough to the extent that she feels an irritation to her throat and is usually associated with chest soreness from coughing.  She complained of some chest pain this morning prior to taking her breathing treatment which improved after the breathing treatment.  Patient states that she she does not get much physical activity due to diffuse arthritis and neuropathy, she lives alone, ambulates with a walker and has a nurse aide that comes to take care of her daily, shortness of breath was usually worse when she tries to ambulate.  Patient states that she has been compliant with her dialysis with last  dialysis being yesterday (9/11).  She denies fever, chills, nausea, vomiting or abdominal pain.  9/13: Patient was admitted with acute hypoxemic respiratory failure that appears to be attributed to COPD exacerbation.  She states that she feels 100% better at this time and would like to go home.  She has done well with breathing treatments and steroids as well as some azithromycin.  She has not required any diuresis and denies any further shortness of breath or chest pain.  She denies any dyspnea with ambulation, but does have some desaturations in the mid 80th percentile for which she will require home nasal cannula oxygen.  She is overall stable for discharge and will be set up with pulmonology in outpatient setting to follow-up.  She has breathing treatments at home and will take her prednisone azithromycin as prescribed and return to ED if needed.  No other acute events noted throughout the course of this brief admission.  She can have follow-up 2D echocardiogram in outpatient setting as needed.  Continue hemodialysis per usual regimen with next session on 9/14.  Discharge Diagnoses:  Principal Problem:   Acute respiratory failure with hypoxia (HCC) Active Problems:   Cerebral infarction [facility])   Essential hypertension   Hyperlipidemia   Atrial fibrillation (HCC)   Elevated troponin   ESRD on dialysis (HCC)   Arthritis   Megaloblastic anemia   Nonsustained ventricular tachycardia ([place])  Principal discharge diagnosis: Acute hypoxemic respiratory failure secondary to COPD exacerbation-resolved.  Discharge Instructions  Discharge Instructions    Ambulatory referral to Pulmonology   Complete by: As directed    Reason for referral: Asthma/COPD   Diet - low sodium heart healthy  Complete by: As directed    Increase activity slowly   Complete by: As directed      Allergies as of 10/27/2019      Reactions   Codeine Nausea And Vomiting      Medication List    TAKE these medications    albuterol 108 (90 Base) MCG/ACT inhaler Commonly known as: VENTOLIN HFA Inhale 1-2 puffs into the lungs every 6 (six) hours as needed for wheezing or shortness of breath. Reported on 03/01/2015   albuterol 0.63 MG/3ML nebulizer solution Commonly known as: ACCUNEB Take 1 ampule by nebulization every 6 (six) hours as needed for wheezing.   apixaban 2.5 MG Tabs tablet Commonly known as: Eliquis Take 1 tablet (2.5 mg total) by mouth 2 (two) times daily.   atorvastatin 40 MG tablet Commonly known as: LIPITOR Take 1 tablet by mouth at bedtime.   azithromycin 250 MG tablet Commonly known as: ZITHROMAX Take 1 tablet (250 mg total) by mouth daily for 4 days.   dextromethorphan-guaiFENesin 30-600 MG 12hr tablet Commonly known as: MUCINEX DM Take 1 tablet by mouth 2 (two) times daily for 10 days.   diltiazem 180 MG 24 hr capsule Commonly known as: DILACOR XR Take 180 mg by mouth daily.   fluticasone-salmeterol 230-21 MCG/ACT inhaler Commonly known as: ADVAIR HFA Inhale 2 puffs into the lungs 2 (two) times daily.   gabapentin 100 MG capsule Commonly known as: NEURONTIN Take 100 mg by mouth 2 (two) times daily.   hydrALAZINE 100 MG tablet Commonly known as: APRESOLINE TAKE ONE (1) TABLET THREE (3) TIMES EACH DAY   magnesium hydroxide 400 MG/5ML suspension Commonly known as: MILK OF MAGNESIA Take 15 mLs by mouth daily as needed for mild constipation.   metoprolol tartrate 25 MG tablet Commonly known as: LOPRESSOR Take 0.5 tablets (12.5 mg total) by mouth 2 (two) times daily.   pantoprazole 40 MG tablet Commonly known as: PROTONIX Take 1 tablet (40 mg total) by mouth daily. What changed:   when to take this  reasons to take this   predniSONE 20 MG tablet Commonly known as: Deltasone Take 2 tablets (40 mg total) by mouth daily for 5 days.   sevelamer carbonate 800 MG tablet Commonly known as: RENVELA Take 800 mg by mouth 3 (three) times daily with meals.   sodium  bicarbonate 650 MG tablet Take 1 tablet (650 mg total) by mouth 2 (two) times daily.   Tyler Aas FlexTouch 100 UNIT/ML FlexTouch Pen Generic drug: insulin degludec Inject 12 Units into the skin daily in the afternoon.            Durable Medical Equipment  (From admission, onward)         Start     Ordered   10/27/19 1118  DME Oxygen  Once       Question Answer Comment  Length of Need Lifetime   Mode or (Route) Nasal cannula   Liters per Minute 2   Frequency Continuous (stationary and portable oxygen unit needed)   Oxygen conserving device Yes   Oxygen delivery system Gas      10/27/19 1118          Follow-up Information    Monico Blitz, MD Follow up in 1 week(s).   Specialty: Internal Medicine Contact information: [address]  [place] [address] [phone]              Allergies  Allergen Reactions  . Codeine Nausea And Vomiting    Consultations:  None   Procedures/Studies: CT Angio Chest PE W and/or Wo Contrast  Result Date: 10/26/2019 CLINICAL DATA:  Suspected pulmonary embolism EXAM: CT ANGIOGRAPHY CHEST WITH CONTRAST TECHNIQUE: Multidetector CT imaging of the chest was performed using the standard protocol during bolus administration of intravenous contrast. Multiplanar CT image reconstructions and MIPs were obtained to evaluate the vascular anatomy. CONTRAST:  120mL OMNIPAQUE IOHEXOL 350 MG/ML SOLN COMPARISON:  October 06, 2018 FINDINGS: Cardiovascular: Aorta with calcified and noncalcified atheromatous plaque. No aneurysmal dilation of the thoracic aorta. Cardiac enlargement, particularly LEFT atrium and RIGHT heart with mitral annular calcifications. Three-vessel coronary artery disease, extensive coronary calcifications particularly on the LEFT. No pericardial effusion. Central pulmonary arteries with engorgement to approximately 3.6 cm. Pulmonary arterial assessment limited by bolus timing and some motion artifact. No central or lobar level pulmonary  embolism Mediastinum/Nodes: 11 mm AP window lymph node previously 9 mm. No thoracic inlet adenopathy. No axillary lymphadenopathy. Scattered small nodes elsewhere in the chest none displaying frank pathologic enlargement by CT criteria. Lungs/Pleura: Mild septal thickening. Tiny pulmonary nodules in the LEFT upper lobe unchanged from previous exam. Basilar atelectasis. Mild RIGHT basilar scarring and bronchiectasis. Upper Abdomen: Incidental imaging of upper abdominal contents without acute process. Cortical scarring of the kidneys bilaterally, not well characterized on today's study and incompletely imaged. Gastric wall thickening may be present. The stomach however remains under distended as on many prior exams. Particularly in the gastric cardia on today's study more so than on other exams and in other areas of the stomach. Musculoskeletal: No acute musculoskeletal process. Review of the MIP images confirms the above findings. IMPRESSION: 1. Pulmonary arterial assessment limited by bolus timing and some motion artifact. No central or lobar level pulmonary embolism. 2. Marked cardiac enlargement as discussed. 3. Mild septal thickening slightly more pronounced at the lung apices may represent early edema. Correlate with any clinical signs or laboratory evidence of heart failure. 4. Gastric wall thickening suggested particularly in the gastric cardia on today's study more so than on other exams and in other areas of the stomach. Correlate with any symptoms of gastritis or peptic ulcer disease. Consider endoscopic follow-up given persistent thickening suggested on previous exams though under distension as certainly been present on prior studies. 5. Pulmonary arterial engorgement may reflect underlying pulmonary arterial hypertension. 6. Tiny pulmonary nodules in the LEFT upper lobe unchanged from previous exam. 7. Aortic atherosclerosis. Aortic Atherosclerosis (ICD10-I70.0). Electronically Signed   By: Zetta Bills  M.D.   On: 10/26/2019 20:12   DG Chest Port 1 View  Result Date: 10/26/2019 CLINICAL DATA:  Short of breath and intermittent chest pain EXAM: PORTABLE CHEST 1 VIEW COMPARISON:  Prior chest x-ray 08/09/2018 FINDINGS: Stable cardiomegaly. Slightly increased pulmonary vascular congestion without overt edema. Atherosclerotic calcifications are again noted in the transverse aorta. No focal airspace consolidation, pleural effusion or pneumothorax. No acute osseous abnormality. IMPRESSION: 1. Cardiomegaly and pulmonary vascular congestion without overt edema. 2. Aortic atherosclerosis. Electronically Signed   By: Jacqulynn Cadet M.D.   On: 10/26/2019 12:36      Discharge Exam: Vitals:   10/27/19 0839 10/27/19 0922  BP:  (!) 148/78  Pulse:  87  Resp:  16  Temp:    SpO2: 99% 100%   Vitals:   10/27/19 0630 10/27/19 0814 10/27/19 0839 10/27/19 0922  BP: (!) 150/80   (!) 148/78  Pulse: 90   87  Resp: 15   16  Temp:      TempSrc:  SpO2: 100% 99% 99% 100%  Weight:      Height:        General: Pt is alert, awake, not in acute distress Cardiovascular: RRR, S1/S2 +, no rubs, no gallops Respiratory: CTA bilaterally, no wheezing, no rhonchi, currently on 2 L nasal cannula oxygen. Abdominal: Soft, NT, ND, bowel sounds + Extremities: no edema, no cyanosis    The results of significant diagnostics from this hospitalization (including imaging, microbiology, ancillary and laboratory) are listed below for reference.     Microbiology: Recent Results (from the past 240 hour(s))  SARS Coronavirus 2 by RT PCR (hospital order, performed in [facility] hospital lab) Nasopharyngeal Nasopharyngeal Swab     Status: None   Collection Time: 10/26/19  2:14 PM   Specimen: Nasopharyngeal Swab  Result Value Ref Range Status   SARS Coronavirus 2 NEGATIVE NEGATIVE Final    Comment: (NOTE) SARS-CoV-2 target nucleic acids are NOT DETECTED.  The SARS-CoV-2 RNA is generally detectable in upper and  lower respiratory specimens during the acute phase of infection. The lowest concentration of SARS-CoV-2 viral copies this assay can detect is 250 copies / mL. A negative result does not preclude SARS-CoV-2 infection and should not be used as the sole basis for treatment or other patient management decisions.  A negative result may occur with improper specimen collection / handling, submission of specimen other than nasopharyngeal swab, presence of viral mutation(s) within the areas targeted by this assay, and inadequate number of viral copies (<250 copies / mL). A negative result must be combined with clinical observations, patient history, and epidemiological information.  Fact Sheet for Patients:   StrictlyIdeas.no  Fact Sheet for Healthcare Providers: BankingDealers.co.za  This test is not yet approved or  cleared by the [place] FDA and has been authorized for detection and/or diagnosis of SARS-CoV-2 by FDA under an Emergency Use Authorization (EUA).  This EUA will remain in effect (meaning this test can be used) for the duration of the COVID-19 declaration under Section 564(b)(1) of the Act, 21 U.S.C. section 360bbb-3(b)(1), unless the authorization is terminated or revoked sooner.  Performed at [facility], [address] [place], [place] [address]      Labs: BNP (last 3 results) No results for input(s): BNP in the last 8760 hours. Basic Metabolic Panel: Recent Labs  Lab 10/26/19 1313 10/27/19 0112  NA 136 134*  K 3.5 3.7  CL 94* 93*  CO2 30 28  GLUCOSE 78 168*  BUN 20 26*  CREATININE 4.09* 4.77*  CALCIUM 9.6 9.0   Liver Function Tests: Recent Labs  Lab 10/27/19 0112  AST 24  ALT 24  ALKPHOS 59  BILITOT 1.1  PROT 6.7  ALBUMIN 3.5   No results for input(s): LIPASE, AMYLASE in the last 168 hours. No results for input(s): AMMONIA in the last 168 hours. CBC: Recent Labs  Lab 10/26/19 1313  10/27/19 0112  WBC 5.5 6.6  HGB 11.8* 10.3*  HCT 38.2 33.4*  MCV 114.4* 113.6*  PLT 160 151   Cardiac Enzymes: No results for input(s): CKTOTAL, CKMB, CKMBINDEX, TROPONINI in the last 168 hours. BNP: Invalid input(s): POCBNP CBG: No results for input(s): GLUCAP in the last 168 hours. D-Dimer No results for input(s): DDIMER in the last 72 hours. Hgb A1c No results for input(s): HGBA1C in the last 72 hours. Lipid Profile No results for input(s): CHOL, HDL, LDLCALC, TRIG, CHOLHDL, LDLDIRECT in the last 72 hours. Thyroid function studies No results for input(s): TSH, T4TOTAL, T3FREE, THYROIDAB  in the last 72 hours.  Invalid input(s): FREET3 Anemia work up Recent Labs    10/27/19 0112  VITAMINB12 481  FOLATE 10.1   Urinalysis    Component Value Date/Time   COLORURINE YELLOW 05/27/2014 1902   APPEARANCEUR CLEAR 05/27/2014 1902   LABSPEC 1.012 05/27/2014 1902   PHURINE 5.0 05/27/2014 1902   GLUCOSEU NEGATIVE 05/27/2014 1902   HGBUR NEGATIVE 05/27/2014 1902   BILIRUBINUR NEGATIVE 05/27/2014 1902   KETONESUR NEGATIVE 05/27/2014 1902   PROTEINUR 100 (A) 05/27/2014 1902   UROBILINOGEN 0.2 05/27/2014 1902   NITRITE NEGATIVE 05/27/2014 1902   LEUKOCYTESUR NEGATIVE 05/27/2014 1902   Sepsis Labs Invalid input(s): PROCALCITONIN,  WBC,  LACTICIDVEN Microbiology Recent Results (from the past 240 hour(s))  SARS Coronavirus 2 by RT PCR (hospital order, performed in [place] hospital lab) Nasopharyngeal Nasopharyngeal Swab     Status: None   Collection Time: 10/26/19  2:14 PM   Specimen: Nasopharyngeal Swab  Result Value Ref Range Status   SARS Coronavirus 2 NEGATIVE NEGATIVE Final    Comment: (NOTE) SARS-CoV-2 target nucleic acids are NOT DETECTED.  The SARS-CoV-2 RNA is generally detectable in upper and lower respiratory specimens during the acute phase of infection. The lowest concentration of SARS-CoV-2 viral copies this assay can detect is 250 copies / mL. A negative  result does not preclude SARS-CoV-2 infection and should not be used as the sole basis for treatment or other patient management decisions.  A negative result may occur with improper specimen collection / handling, submission of specimen other than nasopharyngeal swab, presence of viral mutation(s) within the areas targeted by this assay, and inadequate number of viral copies (<250 copies / mL). A negative result must be combined with clinical observations, patient history, and epidemiological information.  Fact Sheet for Patients:   StrictlyIdeas.no  Fact Sheet for Healthcare Providers: BankingDealers.co.za  This test is not yet approved or  cleared by the [place] FDA and has been authorized for detection and/or diagnosis of SARS-CoV-2 by FDA under an Emergency Use Authorization (EUA).  This EUA will remain in effect (meaning this test can be used) for the duration of the COVID-19 declaration under Section 564(b)(1) of the Act, 21 U.S.C. section 360bbb-3(b)(1), unless the authorization is terminated or revoked sooner.  Performed at [facility], [address], [place], [place] [address]      Time coordinating discharge: 35 minutes  SIGNED:   Rodena Goldmann, DO [facility] 10/27/2019, 11:18 AM  If 7PM-7AM, please contact night-coverage [email or web address]

## 2019-10-27 NOTE — TOC Transition Note (Signed)
Transition of Care [facility]) - CM/SW Discharge Note   Patient Details  [name] MRN: [id number] Date of Birth: 01/06/1941  [facility] CM/SW Contact:  Natasha Bence, LCSW Phone Number: 10/27/2019, 1:22 PM   Clinical Narrative:    CSW received consult for Oxygen. CSW placed referral for O2 with [facility]. [employer] agreeable to provide O2. CSW notified patient's POA that patient will be discharging with O2 and that Barbaraann Rondo with [employer] will be following up with patient. TOC signing off.   Final next level of care: Home/Self Care Barriers to Discharge: Barriers Resolved   Patient Goals and CMS Choice        Discharge Placement                  Name of family member notified: [name] Patient and family notified of of transfer: 10/27/19  Discharge Plan and Services                DME Arranged: Oxygen DME Agency: [employer] Date DME Agency Contacted: 10/27/19 Time DME Agency Contacted: 1100 Representative spoke with at DME Agency: [place] ([place]) Interventions     Readmission Risk Interventions Readmission Risk Prevention Plan 10/27/2019  Transportation Screening Complete  PCP or Specialist Appt within 5-7 Days Complete  Home Care Screening Complete  Medication Review (RN CM) Complete  Some recent data might be hidden

## 2019-10-27 NOTE — Progress Notes (Signed)
SATURATION QUALIFICATIONS: (This note is used to comply with regulatory documentation for home oxygen)  Patient Saturations on Room Air at Rest = 95 %  Patient Saturations on Room Air while Ambulating = 85 %  Patient Saturations on 2 Liters of oxygen while Ambulating = 91 %  Please briefly explain why patient needs home oxygen: Patient experienced dyspnea with exertion during ambulation and was placed on 2L O2.

## 2019-10-27 NOTE — Care Management CC44 (Signed)
Condition Code 44 Documentation Completed  Patient Details  Name: Artina Minella MRN: [id number] Date of Birth: September 30, 1940   Condition Code 44 given:  Yes Patient signature on Condition Code 44 notice:  Yes Documentation of 2 MD's agreement:  Yes Code 44 added to claim:  Yes    Natasha Bence, LCSW 10/27/2019, 1:13 PM

## 2019-10-27 NOTE — ED Notes (Signed)
Patient ambulated with no difficulty however O2 sat levels dropped to 85% once patient began to walk. Patient stated she didn't feel short of breath.

## 2019-10-27 NOTE — Care Management Obs Status (Signed)
[facility] NOTIFICATION   Patient Details  [name] MRN: [id number] Date of Birth: 12/09/1940   Medicare Observation Status Notification Given:  Yes    Natasha Bence, LCSW 10/27/2019, 1:09 PM

## 2019-10-27 NOTE — Progress Notes (Signed)
Nsg Discharge Note  Admit Date:  10/26/2019 Discharge date: 10/27/2019   Jody [name] to be D/C'd Home per MD order.  AVS completed.  Patient able to verbalize understanding.  Discharge Medication: Allergies as of 10/27/2019      Reactions   Codeine Nausea And Vomiting      Medication List    TAKE these medications   albuterol 108 (90 Base) MCG/ACT inhaler Commonly known as: VENTOLIN HFA Inhale 1-2 puffs into the lungs every 6 (six) hours as needed for wheezing or shortness of breath. Reported on 03/01/2015   albuterol 0.63 MG/3ML nebulizer solution Commonly known as: ACCUNEB Take 1 ampule by nebulization every 6 (six) hours as needed for wheezing.   apixaban 2.5 MG Tabs tablet Commonly known as: Eliquis Take 1 tablet (2.5 mg total) by mouth 2 (two) times daily.   atorvastatin 40 MG tablet Commonly known as: LIPITOR Take 1 tablet by mouth at bedtime.   azithromycin 250 MG tablet Commonly known as: ZITHROMAX Take 1 tablet (250 mg total) by mouth daily for 4 days.   dextromethorphan-guaiFENesin 30-600 MG 12hr tablet Commonly known as: MUCINEX DM Take 1 tablet by mouth 2 (two) times daily for 10 days.   diltiazem 180 MG 24 hr capsule Commonly known as: DILACOR XR Take 180 mg by mouth daily.   fluticasone-salmeterol 230-21 MCG/ACT inhaler Commonly known as: ADVAIR HFA Inhale 2 puffs into the lungs 2 (two) times daily.   gabapentin 100 MG capsule Commonly known as: NEURONTIN Take 100 mg by mouth 2 (two) times daily.   hydrALAZINE 100 MG tablet Commonly known as: APRESOLINE TAKE ONE (1) TABLET THREE (3) TIMES EACH DAY   magnesium hydroxide 400 MG/5ML suspension Commonly known as: MILK OF MAGNESIA Take 15 mLs by mouth daily as needed for mild constipation.   metoprolol tartrate 25 MG tablet Commonly known as: LOPRESSOR Take 0.5 tablets (12.5 mg total) by mouth 2 (two) times daily.   pantoprazole 40 MG tablet Commonly known as: PROTONIX Take 1 tablet (40 mg  total) by mouth daily. What changed:   when to take this  reasons to take this   predniSONE 20 MG tablet Commonly known as: Deltasone Take 2 tablets (40 mg total) by mouth daily for 5 days.   sevelamer carbonate 800 MG tablet Commonly known as: RENVELA Take 800 mg by mouth 3 (three) times daily with meals.   sodium bicarbonate 650 MG tablet Take 1 tablet (650 mg total) by mouth 2 (two) times daily.   Tyler Aas FlexTouch 100 UNIT/ML FlexTouch Pen Generic drug: insulin degludec Inject 12 Units into the skin daily in the afternoon.            Durable Medical Equipment  (From admission, onward)         Start     Ordered   10/27/19 1118  DME Oxygen  Once       Question Answer Comment  Length of Need Lifetime   Mode or (Route) Nasal cannula   Liters per Minute 2   Frequency Continuous (stationary and portable oxygen unit needed)   Oxygen conserving device Yes   Oxygen delivery system Gas      10/27/19 1118          Discharge Assessment: Vitals:   10/27/19 0922 10/27/19 1314  BP: (!) 148/78   Pulse: 87   Resp: 16   Temp:    SpO2: 100% 99%   Skin clean, dry and intact without evidence of skin break down, no  evidence of skin tears noted. IV catheter discontinued intact. Site without signs and symptoms of complications - no redness or edema noted at insertion site, patient denies c/o pain - only slight tenderness at site.  Dressing with slight pressure applied.  D/c Instructions-Education: Discharge instructions given to patient with verbalized understanding. D/c education completed with patient including follow up instructions, medication list, d/c activities limitations if indicated, with other d/c instructions as indicated by MD - patient able to verbalize understanding, all questions fully answered. Patient instructed to return to ED, call 911, or call MD for any changes in condition.  Patient escorted via [place], and D/C home via private auto.  Berton Bon,  RN 10/27/2019 4:35 PM

## 2019-10-28 DIAGNOSIS — Z992 Dependence on renal dialysis: Secondary | ICD-10-CM | POA: Diagnosis not present

## 2019-10-28 DIAGNOSIS — N186 End stage renal disease: Secondary | ICD-10-CM | POA: Diagnosis not present

## 2019-10-28 DIAGNOSIS — N2581 Secondary hyperparathyroidism of renal origin: Secondary | ICD-10-CM | POA: Diagnosis not present

## 2019-10-29 DIAGNOSIS — I5032 Chronic diastolic (congestive) heart failure: Secondary | ICD-10-CM | POA: Diagnosis not present

## 2019-10-29 DIAGNOSIS — J9601 Acute respiratory failure with hypoxia: Secondary | ICD-10-CM | POA: Diagnosis not present

## 2019-10-29 DIAGNOSIS — R6889 Other general symptoms and signs: Secondary | ICD-10-CM | POA: Diagnosis not present

## 2019-10-29 DIAGNOSIS — M6281 Muscle weakness (generalized): Secondary | ICD-10-CM | POA: Diagnosis not present

## 2019-10-30 DIAGNOSIS — Z992 Dependence on renal dialysis: Secondary | ICD-10-CM | POA: Diagnosis not present

## 2019-10-30 DIAGNOSIS — N186 End stage renal disease: Secondary | ICD-10-CM | POA: Diagnosis not present

## 2019-11-01 DIAGNOSIS — N186 End stage renal disease: Secondary | ICD-10-CM | POA: Diagnosis not present

## 2019-11-01 DIAGNOSIS — Z992 Dependence on renal dialysis: Secondary | ICD-10-CM | POA: Diagnosis not present

## 2019-11-04 DIAGNOSIS — N186 End stage renal disease: Secondary | ICD-10-CM | POA: Diagnosis not present

## 2019-11-04 DIAGNOSIS — Z992 Dependence on renal dialysis: Secondary | ICD-10-CM | POA: Diagnosis not present

## 2019-11-06 DIAGNOSIS — Z992 Dependence on renal dialysis: Secondary | ICD-10-CM | POA: Diagnosis not present

## 2019-11-06 DIAGNOSIS — N186 End stage renal disease: Secondary | ICD-10-CM | POA: Diagnosis not present

## 2019-11-08 DIAGNOSIS — Z992 Dependence on renal dialysis: Secondary | ICD-10-CM | POA: Diagnosis not present

## 2019-11-08 DIAGNOSIS — N186 End stage renal disease: Secondary | ICD-10-CM | POA: Diagnosis not present

## 2019-11-11 DIAGNOSIS — D509 Iron deficiency anemia, unspecified: Secondary | ICD-10-CM | POA: Diagnosis not present

## 2019-11-11 DIAGNOSIS — Z992 Dependence on renal dialysis: Secondary | ICD-10-CM | POA: Diagnosis not present

## 2019-11-11 DIAGNOSIS — N186 End stage renal disease: Secondary | ICD-10-CM | POA: Diagnosis not present

## 2019-11-13 DIAGNOSIS — J449 Chronic obstructive pulmonary disease, unspecified: Secondary | ICD-10-CM | POA: Diagnosis not present

## 2019-11-13 DIAGNOSIS — E119 Type 2 diabetes mellitus without complications: Secondary | ICD-10-CM | POA: Diagnosis not present

## 2019-11-13 DIAGNOSIS — Z992 Dependence on renal dialysis: Secondary | ICD-10-CM | POA: Diagnosis not present

## 2019-11-13 DIAGNOSIS — N186 End stage renal disease: Secondary | ICD-10-CM | POA: Diagnosis not present

## 2019-11-13 DIAGNOSIS — I10 Essential (primary) hypertension: Secondary | ICD-10-CM | POA: Diagnosis not present

## 2019-11-13 DIAGNOSIS — I509 Heart failure, unspecified: Secondary | ICD-10-CM | POA: Diagnosis not present

## 2019-11-15 DIAGNOSIS — Z992 Dependence on renal dialysis: Secondary | ICD-10-CM | POA: Diagnosis not present

## 2019-11-15 DIAGNOSIS — N186 End stage renal disease: Secondary | ICD-10-CM | POA: Diagnosis not present

## 2019-11-15 DIAGNOSIS — Z23 Encounter for immunization: Secondary | ICD-10-CM | POA: Diagnosis not present

## 2019-11-18 DIAGNOSIS — N2581 Secondary hyperparathyroidism of renal origin: Secondary | ICD-10-CM | POA: Diagnosis not present

## 2019-11-18 DIAGNOSIS — Z23 Encounter for immunization: Secondary | ICD-10-CM | POA: Diagnosis not present

## 2019-11-18 DIAGNOSIS — N186 End stage renal disease: Secondary | ICD-10-CM | POA: Diagnosis not present

## 2019-11-18 DIAGNOSIS — Z992 Dependence on renal dialysis: Secondary | ICD-10-CM | POA: Diagnosis not present

## 2019-11-20 DIAGNOSIS — Z992 Dependence on renal dialysis: Secondary | ICD-10-CM | POA: Diagnosis not present

## 2019-11-20 DIAGNOSIS — N186 End stage renal disease: Secondary | ICD-10-CM | POA: Diagnosis not present

## 2019-11-20 DIAGNOSIS — Z23 Encounter for immunization: Secondary | ICD-10-CM | POA: Diagnosis not present

## 2019-11-22 DIAGNOSIS — N186 End stage renal disease: Secondary | ICD-10-CM | POA: Diagnosis not present

## 2019-11-22 DIAGNOSIS — Z992 Dependence on renal dialysis: Secondary | ICD-10-CM | POA: Diagnosis not present

## 2019-11-22 DIAGNOSIS — Z23 Encounter for immunization: Secondary | ICD-10-CM | POA: Diagnosis not present

## 2019-11-25 DIAGNOSIS — N186 End stage renal disease: Secondary | ICD-10-CM | POA: Diagnosis not present

## 2019-11-25 DIAGNOSIS — N2581 Secondary hyperparathyroidism of renal origin: Secondary | ICD-10-CM | POA: Diagnosis not present

## 2019-11-25 DIAGNOSIS — Z23 Encounter for immunization: Secondary | ICD-10-CM | POA: Diagnosis not present

## 2019-11-25 DIAGNOSIS — Z992 Dependence on renal dialysis: Secondary | ICD-10-CM | POA: Diagnosis not present

## 2019-11-27 DIAGNOSIS — N186 End stage renal disease: Secondary | ICD-10-CM | POA: Diagnosis not present

## 2019-11-27 DIAGNOSIS — Z23 Encounter for immunization: Secondary | ICD-10-CM | POA: Diagnosis not present

## 2019-11-27 DIAGNOSIS — Z992 Dependence on renal dialysis: Secondary | ICD-10-CM | POA: Diagnosis not present

## 2019-11-29 DIAGNOSIS — Z23 Encounter for immunization: Secondary | ICD-10-CM | POA: Diagnosis not present

## 2019-11-29 DIAGNOSIS — Z992 Dependence on renal dialysis: Secondary | ICD-10-CM | POA: Diagnosis not present

## 2019-11-29 DIAGNOSIS — N186 End stage renal disease: Secondary | ICD-10-CM | POA: Diagnosis not present

## 2019-12-02 DIAGNOSIS — Z23 Encounter for immunization: Secondary | ICD-10-CM | POA: Diagnosis not present

## 2019-12-02 DIAGNOSIS — Z992 Dependence on renal dialysis: Secondary | ICD-10-CM | POA: Diagnosis not present

## 2019-12-02 DIAGNOSIS — N186 End stage renal disease: Secondary | ICD-10-CM | POA: Diagnosis not present

## 2019-12-03 DIAGNOSIS — M549 Dorsalgia, unspecified: Secondary | ICD-10-CM | POA: Diagnosis not present

## 2019-12-03 DIAGNOSIS — M1712 Unilateral primary osteoarthritis, left knee: Secondary | ICD-10-CM | POA: Diagnosis not present

## 2019-12-03 DIAGNOSIS — Z299 Encounter for prophylactic measures, unspecified: Secondary | ICD-10-CM | POA: Diagnosis not present

## 2019-12-03 DIAGNOSIS — E1165 Type 2 diabetes mellitus with hyperglycemia: Secondary | ICD-10-CM | POA: Diagnosis not present

## 2019-12-03 DIAGNOSIS — D179 Benign lipomatous neoplasm, unspecified: Secondary | ICD-10-CM | POA: Diagnosis not present

## 2019-12-04 DIAGNOSIS — Z23 Encounter for immunization: Secondary | ICD-10-CM | POA: Diagnosis not present

## 2019-12-04 DIAGNOSIS — N186 End stage renal disease: Secondary | ICD-10-CM | POA: Diagnosis not present

## 2019-12-04 DIAGNOSIS — Z992 Dependence on renal dialysis: Secondary | ICD-10-CM | POA: Diagnosis not present

## 2019-12-06 DIAGNOSIS — Z992 Dependence on renal dialysis: Secondary | ICD-10-CM | POA: Diagnosis not present

## 2019-12-06 DIAGNOSIS — Z23 Encounter for immunization: Secondary | ICD-10-CM | POA: Diagnosis not present

## 2019-12-06 DIAGNOSIS — N186 End stage renal disease: Secondary | ICD-10-CM | POA: Diagnosis not present

## 2019-12-09 DIAGNOSIS — Z992 Dependence on renal dialysis: Secondary | ICD-10-CM | POA: Diagnosis not present

## 2019-12-09 DIAGNOSIS — Z23 Encounter for immunization: Secondary | ICD-10-CM | POA: Diagnosis not present

## 2019-12-09 DIAGNOSIS — N186 End stage renal disease: Secondary | ICD-10-CM | POA: Diagnosis not present

## 2019-12-09 DIAGNOSIS — N2581 Secondary hyperparathyroidism of renal origin: Secondary | ICD-10-CM | POA: Diagnosis not present

## 2019-12-11 DIAGNOSIS — N186 End stage renal disease: Secondary | ICD-10-CM | POA: Diagnosis not present

## 2019-12-11 DIAGNOSIS — Z23 Encounter for immunization: Secondary | ICD-10-CM | POA: Diagnosis not present

## 2019-12-11 DIAGNOSIS — Z992 Dependence on renal dialysis: Secondary | ICD-10-CM | POA: Diagnosis not present

## 2019-12-12 DIAGNOSIS — I10 Essential (primary) hypertension: Secondary | ICD-10-CM | POA: Diagnosis not present

## 2019-12-12 DIAGNOSIS — I509 Heart failure, unspecified: Secondary | ICD-10-CM | POA: Diagnosis not present

## 2019-12-12 DIAGNOSIS — E119 Type 2 diabetes mellitus without complications: Secondary | ICD-10-CM | POA: Diagnosis not present

## 2019-12-12 DIAGNOSIS — J449 Chronic obstructive pulmonary disease, unspecified: Secondary | ICD-10-CM | POA: Diagnosis not present

## 2019-12-13 DIAGNOSIS — N186 End stage renal disease: Secondary | ICD-10-CM | POA: Diagnosis not present

## 2019-12-13 DIAGNOSIS — Z992 Dependence on renal dialysis: Secondary | ICD-10-CM | POA: Diagnosis not present

## 2019-12-13 DIAGNOSIS — Z23 Encounter for immunization: Secondary | ICD-10-CM | POA: Diagnosis not present

## 2019-12-14 DIAGNOSIS — N186 End stage renal disease: Secondary | ICD-10-CM | POA: Diagnosis not present

## 2019-12-14 DIAGNOSIS — Z992 Dependence on renal dialysis: Secondary | ICD-10-CM | POA: Diagnosis not present

## 2019-12-15 DIAGNOSIS — R6889 Other general symptoms and signs: Secondary | ICD-10-CM | POA: Diagnosis not present

## 2019-12-15 DIAGNOSIS — I5032 Chronic diastolic (congestive) heart failure: Secondary | ICD-10-CM | POA: Diagnosis not present

## 2019-12-15 DIAGNOSIS — M6281 Muscle weakness (generalized): Secondary | ICD-10-CM | POA: Diagnosis not present

## 2019-12-15 DIAGNOSIS — J9601 Acute respiratory failure with hypoxia: Secondary | ICD-10-CM | POA: Diagnosis not present

## 2019-12-16 DIAGNOSIS — Z992 Dependence on renal dialysis: Secondary | ICD-10-CM | POA: Diagnosis not present

## 2019-12-16 DIAGNOSIS — N186 End stage renal disease: Secondary | ICD-10-CM | POA: Diagnosis not present

## 2019-12-18 DIAGNOSIS — N186 End stage renal disease: Secondary | ICD-10-CM | POA: Diagnosis not present

## 2019-12-18 DIAGNOSIS — Z992 Dependence on renal dialysis: Secondary | ICD-10-CM | POA: Diagnosis not present

## 2019-12-20 DIAGNOSIS — Z992 Dependence on renal dialysis: Secondary | ICD-10-CM | POA: Diagnosis not present

## 2019-12-20 DIAGNOSIS — N186 End stage renal disease: Secondary | ICD-10-CM | POA: Diagnosis not present

## 2019-12-23 DIAGNOSIS — Z992 Dependence on renal dialysis: Secondary | ICD-10-CM | POA: Diagnosis not present

## 2019-12-23 DIAGNOSIS — N186 End stage renal disease: Secondary | ICD-10-CM | POA: Diagnosis not present

## 2019-12-25 DIAGNOSIS — Z992 Dependence on renal dialysis: Secondary | ICD-10-CM | POA: Diagnosis not present

## 2019-12-25 DIAGNOSIS — N186 End stage renal disease: Secondary | ICD-10-CM | POA: Diagnosis not present

## 2019-12-27 ENCOUNTER — Encounter (HOSPITAL_COMMUNITY): Payer: Self-pay

## 2019-12-27 ENCOUNTER — Inpatient Hospital Stay (HOSPITAL_COMMUNITY)
Admission: EM | Admit: 2019-12-27 | Discharge: 2019-12-31 | DRG: 377 | Disposition: A | Discharge status: Home / Self Care | Payer: Medicare Other | Attending: Internal Medicine | Admitting: Internal Medicine

## 2019-12-27 ENCOUNTER — Other Ambulatory Visit: Payer: Self-pay

## 2019-12-27 DIAGNOSIS — Z992 Dependence on renal dialysis: Secondary | ICD-10-CM | POA: Diagnosis not present

## 2019-12-27 DIAGNOSIS — Z794 Long term (current) use of insulin: Secondary | ICD-10-CM

## 2019-12-27 DIAGNOSIS — Z9981 Dependence on supplemental oxygen: Secondary | ICD-10-CM | POA: Diagnosis not present

## 2019-12-27 DIAGNOSIS — Z20822 Contact with and (suspected) exposure to covid-19: Secondary | ICD-10-CM | POA: Diagnosis present

## 2019-12-27 DIAGNOSIS — I480 Paroxysmal atrial fibrillation: Secondary | ICD-10-CM | POA: Diagnosis not present

## 2019-12-27 DIAGNOSIS — K5731 Diverticulosis of large intestine without perforation or abscess with bleeding: Secondary | ICD-10-CM | POA: Diagnosis not present

## 2019-12-27 DIAGNOSIS — Z79899 Other long term (current) drug therapy: Secondary | ICD-10-CM | POA: Diagnosis not present

## 2019-12-27 DIAGNOSIS — K922 Gastrointestinal hemorrhage, unspecified: Secondary | ICD-10-CM | POA: Diagnosis present

## 2019-12-27 DIAGNOSIS — R58 Hemorrhage, not elsewhere classified: Secondary | ICD-10-CM | POA: Diagnosis not present

## 2019-12-27 DIAGNOSIS — E8889 Other specified metabolic disorders: Secondary | ICD-10-CM | POA: Diagnosis present

## 2019-12-27 DIAGNOSIS — I639 Cerebral infarction, unspecified: Secondary | ICD-10-CM | POA: Diagnosis present

## 2019-12-27 DIAGNOSIS — E1122 Type 2 diabetes mellitus with diabetic chronic kidney disease: Secondary | ICD-10-CM | POA: Diagnosis present

## 2019-12-27 DIAGNOSIS — Z7901 Long term (current) use of anticoagulants: Secondary | ICD-10-CM | POA: Diagnosis not present

## 2019-12-27 DIAGNOSIS — R6889 Other general symptoms and signs: Secondary | ICD-10-CM | POA: Diagnosis not present

## 2019-12-27 DIAGNOSIS — I959 Hypotension, unspecified: Secondary | ICD-10-CM | POA: Diagnosis not present

## 2019-12-27 DIAGNOSIS — E1121 Type 2 diabetes mellitus with diabetic nephropathy: Secondary | ICD-10-CM | POA: Diagnosis present

## 2019-12-27 DIAGNOSIS — I5043 Acute on chronic combined systolic (congestive) and diastolic (congestive) heart failure: Secondary | ICD-10-CM | POA: Diagnosis present

## 2019-12-27 DIAGNOSIS — G459 Transient cerebral ischemic attack, unspecified: Secondary | ICD-10-CM | POA: Diagnosis present

## 2019-12-27 DIAGNOSIS — Z96651 Presence of right artificial knee joint: Secondary | ICD-10-CM | POA: Diagnosis not present

## 2019-12-27 DIAGNOSIS — D631 Anemia in chronic kidney disease: Secondary | ICD-10-CM | POA: Diagnosis not present

## 2019-12-27 DIAGNOSIS — E7800 Pure hypercholesterolemia, unspecified: Secondary | ICD-10-CM | POA: Diagnosis present

## 2019-12-27 DIAGNOSIS — K625 Hemorrhage of anus and rectum: Principal | ICD-10-CM | POA: Diagnosis present

## 2019-12-27 DIAGNOSIS — Z743 Need for continuous supervision: Secondary | ICD-10-CM | POA: Diagnosis not present

## 2019-12-27 DIAGNOSIS — Z7951 Long term (current) use of inhaled steroids: Secondary | ICD-10-CM | POA: Diagnosis not present

## 2019-12-27 DIAGNOSIS — I10 Essential (primary) hypertension: Secondary | ICD-10-CM | POA: Diagnosis not present

## 2019-12-27 DIAGNOSIS — K219 Gastro-esophageal reflux disease without esophagitis: Secondary | ICD-10-CM | POA: Diagnosis present

## 2019-12-27 DIAGNOSIS — N2581 Secondary hyperparathyroidism of renal origin: Secondary | ICD-10-CM | POA: Diagnosis present

## 2019-12-27 DIAGNOSIS — I5032 Chronic diastolic (congestive) heart failure: Secondary | ICD-10-CM | POA: Diagnosis not present

## 2019-12-27 DIAGNOSIS — N186 End stage renal disease: Secondary | ICD-10-CM | POA: Diagnosis not present

## 2019-12-27 DIAGNOSIS — Z8673 Personal history of transient ischemic attack (TIA), and cerebral infarction without residual deficits: Secondary | ICD-10-CM

## 2019-12-27 DIAGNOSIS — E871 Hypo-osmolality and hyponatremia: Secondary | ICD-10-CM | POA: Diagnosis not present

## 2019-12-27 DIAGNOSIS — I482 Chronic atrial fibrillation, unspecified: Secondary | ICD-10-CM | POA: Diagnosis not present

## 2019-12-27 DIAGNOSIS — I4891 Unspecified atrial fibrillation: Secondary | ICD-10-CM | POA: Diagnosis present

## 2019-12-27 DIAGNOSIS — I5033 Acute on chronic diastolic (congestive) heart failure: Secondary | ICD-10-CM | POA: Diagnosis present

## 2019-12-27 DIAGNOSIS — I110 Hypertensive heart disease with heart failure: Secondary | ICD-10-CM | POA: Diagnosis not present

## 2019-12-27 DIAGNOSIS — D62 Acute posthemorrhagic anemia: Secondary | ICD-10-CM | POA: Diagnosis present

## 2019-12-27 DIAGNOSIS — I132 Hypertensive heart and chronic kidney disease with heart failure and with stage 5 chronic kidney disease, or end stage renal disease: Secondary | ICD-10-CM | POA: Diagnosis not present

## 2019-12-27 DIAGNOSIS — I5042 Chronic combined systolic (congestive) and diastolic (congestive) heart failure: Secondary | ICD-10-CM | POA: Diagnosis present

## 2019-12-27 DIAGNOSIS — Z87891 Personal history of nicotine dependence: Secondary | ICD-10-CM | POA: Diagnosis not present

## 2019-12-27 DIAGNOSIS — R0902 Hypoxemia: Secondary | ICD-10-CM | POA: Diagnosis not present

## 2019-12-27 DIAGNOSIS — E1129 Type 2 diabetes mellitus with other diabetic kidney complication: Secondary | ICD-10-CM | POA: Diagnosis not present

## 2019-12-27 DIAGNOSIS — J449 Chronic obstructive pulmonary disease, unspecified: Secondary | ICD-10-CM | POA: Diagnosis not present

## 2019-12-27 DIAGNOSIS — E1142 Type 2 diabetes mellitus with diabetic polyneuropathy: Secondary | ICD-10-CM | POA: Diagnosis present

## 2019-12-27 DIAGNOSIS — E1159 Type 2 diabetes mellitus with other circulatory complications: Secondary | ICD-10-CM | POA: Diagnosis present

## 2019-12-27 DIAGNOSIS — I120 Hypertensive chronic kidney disease with stage 5 chronic kidney disease or end stage renal disease: Secondary | ICD-10-CM | POA: Diagnosis not present

## 2019-12-27 LAB — BLOOD GAS, ARTERIAL
Acid-Base Excess: 0.2 mmol/L (ref 0.0–2.0)
Bicarbonate: 24.7 mmol/L (ref 20.0–28.0)
FIO2: 21
O2 Saturation: 97.5 %
Patient temperature: 37
pCO2 arterial: 36.7 mmHg (ref 32.0–48.0)
pH, Arterial: 7.431 (ref 7.350–7.450)
pO2, Arterial: 134 mmHg — ABNORMAL HIGH (ref 83.0–108.0)

## 2019-12-27 LAB — COMPREHENSIVE METABOLIC PANEL
ALT: 18 U/L (ref 0–44)
AST: 24 U/L (ref 15–41)
Albumin: 3.6 g/dL (ref 3.5–5.0)
Alkaline Phosphatase: 74 U/L (ref 38–126)
Anion gap: 14 (ref 5–15)
BUN: 39 mg/dL — ABNORMAL HIGH (ref 8–23)
CO2: 26 mmol/L (ref 22–32)
Calcium: 9.7 mg/dL (ref 8.9–10.3)
Chloride: 95 mmol/L — ABNORMAL LOW (ref 98–111)
Creatinine, Ser: 6 mg/dL — ABNORMAL HIGH (ref 0.44–1.00)
GFR, Estimated: 7 mL/min — ABNORMAL LOW (ref >60)
Glucose, Bld: 141 mg/dL — ABNORMAL HIGH (ref 70–99)
Potassium: 4.3 mmol/L (ref 3.5–5.1)
Sodium: 135 mmol/L (ref 135–145)
Total Bilirubin: 0.9 mg/dL (ref 0.3–1.2)
Total Protein: 6.7 g/dL (ref 6.5–8.1)

## 2019-12-27 LAB — CBC
HCT: 31.5 % — ABNORMAL LOW (ref 36.0–46.0)
Hemoglobin: 9.7 g/dL — ABNORMAL LOW (ref 12.0–15.0)
MCH: 35.5 pg — ABNORMAL HIGH (ref 26.0–34.0)
MCHC: 30.8 g/dL (ref 30.0–36.0)
MCV: 115.4 fL — ABNORMAL HIGH (ref 80.0–100.0)
Platelets: 176 10*3/uL (ref 150–400)
RBC: 2.73 MIL/uL — ABNORMAL LOW (ref 3.87–5.11)
RDW: 14.5 % (ref 11.5–15.5)
WBC: 7.2 10*3/uL (ref 4.0–10.5)
nRBC: 0 % (ref 0.0–0.2)

## 2019-12-27 LAB — RESPIRATORY PANEL BY RT PCR (FLU A&B, COVID)
Influenza A by PCR: NEGATIVE
Influenza B by PCR: NEGATIVE
SARS Coronavirus 2 by RT PCR: NEGATIVE

## 2019-12-27 LAB — PREPARE RBC (CROSSMATCH)

## 2019-12-27 LAB — PROTIME-INR
INR: 1 (ref 0.8–1.2)
Prothrombin Time: 12.8 seconds (ref 11.4–15.2)

## 2019-12-27 LAB — POC OCCULT BLOOD, ED: Fecal Occult Bld: POSITIVE — AB

## 2019-12-27 LAB — MAGNESIUM: Magnesium: 2.6 mg/dL — ABNORMAL HIGH (ref 1.7–2.4)

## 2019-12-27 MED ORDER — ONDANSETRON HCL 4 MG/2ML IJ SOLN
4.0000 mg | Freq: Four times a day (QID) | INTRAMUSCULAR | Status: DC | PRN
  Filled 2019-12-27: qty 2

## 2019-12-27 MED ORDER — ONDANSETRON HCL 4 MG PO TABS
4.0000 mg | ORAL_TABLET | Freq: Four times a day (QID) | ORAL | Status: DC | PRN
  Administered 2019-12-29: 4 mg via ORAL

## 2019-12-27 MED ORDER — MOMETASONE FURO-FORMOTEROL FUM 200-5 MCG/ACT IN AERO
2.0000 | INHALATION_SPRAY | Freq: Two times a day (BID) | RESPIRATORY_TRACT | Status: DC
  Administered 2019-12-27 – 2019-12-31 (×8): 2 via RESPIRATORY_TRACT
  Filled 2019-12-27: qty 8.8

## 2019-12-27 MED ORDER — PANTOPRAZOLE SODIUM 40 MG IV SOLR
40.0000 mg | INTRAVENOUS | Status: DC
  Administered 2019-12-27 – 2019-12-30 (×4): 40 mg via INTRAVENOUS
  Filled 2019-12-27 (×4): qty 40

## 2019-12-27 MED ORDER — SODIUM CHLORIDE 0.9 % IV SOLN
10.0000 mL/h | Freq: Once | INTRAVENOUS | Status: AC
  Administered 2019-12-27: 10 mL/h via INTRAVENOUS

## 2019-12-27 MED ORDER — SODIUM CHLORIDE 0.9% IV SOLUTION: Freq: Once | INTRAVENOUS | Status: DC

## 2019-12-27 NOTE — H&P (Addendum)
History and Physical    Jody [name] ZOX:[id number] DOB: [birth year] DOA: 12/27/2019  PCP: Monico Blitz, MD   Patient coming from: Home  I have personally briefly reviewed patient's old medical records in [place]  Chief Complaint: Rectal bleeding  HPI: Jody [name] is a 79 y.o. female with medical history significant for ESRD, diabetes mellitus, atrial fibrillation, COPD, CKD 4, diastolic CHF. Patient presented to the ED with complaints of one episode of bright red blood per rectum today during HD.  Patient was about 30 minutes to 1 hour into her session when HD was stopped. HD schedule Tuesday Thursday Saturday. No abdominal pain, no vomiting of blood.  No black stools.  She denies NSAID use and uses only Tylenol for pain.  Home medication list has Eliquis, but patient has not taken this medication in a while, in at least a month (unable to specify).  She reports she was told to stop the Eliquis and her blood pressure medications.  ED Course: Blood pressure systolic down to 81/19.  O2 sat fluctuating from 70s to 90s due to cold extremities, no improvement with O2, seem breathing even checking earlobes.. Hemoglobin 7.2.  In the ED, patient had 4 large volume episodes of frank blood per rectum.  EDP talked to gastroenterologist on-call, patient to be seen in the morning.  Hospitalist to admit for GI blood loss.  Review of Systems: As per HPI all other systems reviewed and negative.  Past Medical History:  Diagnosis Date  . Anemia in chronic kidney disease (CODE) 06/05/2015  . Arthritis    "bad in my legs" (12/07/2014)  . Chronic back pain    "dr said my spine is crooked"  . Chronic bronchitis ([place])    "get it q yr" (12/07/2014)  . CKD stage 4 due to type 2 diabetes mellitus ([place]) 06/05/2015  . Complication of anesthesia    headache for 2 days after surgery in May 2019  . COPD (chronic obstructive pulmonary disease) ([place])   . Gait difficulty 09/02/2014  .  Hypercholesterolemia   . Hypertension   . Neuropathy 2015   in legs  . NSVT (nonsustained ventricular tachycardia) ([place])   . PAF (paroxysmal atrial fibrillation) ([place]) 06/05/2015  . Sinus brady-tachy syndrome ([place])   . Stroke ([place]) 05/2014   denies residual on 12/07/2014  . Type II diabetes mellitus ([place])     Past Surgical History:  Procedure Laterality Date  . ABDOMINAL HYSTERECTOMY  ~ 1970  . APPENDECTOMY  ~ 1970  . AV FISTULA PLACEMENT Right 07/06/2017   Procedure: RIGHT RADIOCEPHALIC ARTERIOVENOUS FISTULA creation;  Surgeon: Conrad Blackstone, MD;  Location: [facility];  Service: Vascular;  Laterality: Right;  . BASCILIC VEIN TRANSPOSITION Right 09/17/2017   Procedure: SECOND STAGE BASILIC VEIN TRANSPOSITION RIGHT ARM;  Surgeon: Rosetta Posner, MD;  Location: [facility];  Service: Vascular;  Laterality: Right;  . COLONOSCOPY N/A 10/28/2017   Procedure: COLONOSCOPY;  Surgeon: Rogene Houston, MD;  [facility];  Service: Endoscopy;  Laterality: N/A;  . ESOPHAGOGASTRODUODENOSCOPY (EGD) WITH PROPOFOL N/A 10/31/2017   Procedure: ESOPHAGOGASTRODUODENOSCOPY (EGD) WITH PROPOFOL;  Surgeon: Rogene Houston, MD;  [facility];  Service: Endoscopy;  Laterality: N/A;  . JOINT REPLACEMENT    . TOTAL KNEE ARTHROPLASTY Right ~ 1986  . TUMOR EXCISION  ~ 1970   "9# in my stomach"     reports that she quit smoking about 15 years ago. Her smoking use included cigarettes. She started smoking about  66 years ago. She has a 25.00 pack-year smoking history. She has never used smokeless tobacco. She reports that she does not drink alcohol and does not use drugs.  Allergies  Allergen Reactions  . Codeine Nausea And Vomiting    Family History  Problem Relation Age of Onset  . Cancer Other   . Heart attack Brother   . Cancer Sister        breast  . Alcohol abuse Brother     Prior to Admission medications   Medication Sig Start Date End Date Taking? Authorizing Provider  albuterol (ACCUNEB)  0.63 MG/3ML nebulizer solution Take 1 ampule by nebulization every 6 (six) hours as needed for wheezing.    [provider]  albuterol (PROVENTIL HFA;VENTOLIN HFA) 108 (90 BASE) MCG/ACT inhaler Inhale 1-2 puffs into the lungs every 6 (six) hours as needed for wheezing or shortness of breath. Reported on 03/01/2015    [provider]  apixaban (ELIQUIS) 2.5 MG TABS tablet Take 1 tablet (2.5 mg total) by mouth 2 (two) times daily. 04/16/18   Dunn, Nedra Hai, PA-C  atorvastatin (LIPITOR) 40 MG tablet Take 1 tablet by mouth at bedtime.  01/22/16   [provider]  diltiazem (DILACOR XR) 180 MG 24 hr capsule Take 180 mg by mouth daily.    [provider]  fluticasone-salmeterol (ADVAIR HFA) 230-21 MCG/ACT inhaler Inhale 2 puffs into the lungs 2 (two) times daily.    [provider]  gabapentin (NEURONTIN) 100 MG capsule Take 100 mg by mouth 2 (two) times daily.  07/23/19   [provider]  hydrALAZINE (APRESOLINE) 100 MG tablet TAKE ONE (1) TABLET THREE (3) TIMES EACH DAY Patient not taking: Reported on 10/27/2019 07/16/17   Herminio Commons, MD  magnesium hydroxide (MILK OF MAGNESIA) 400 MG/5ML suspension Take 15 mLs by mouth daily as needed for mild constipation.    [provider]  metoprolol tartrate (LOPRESSOR) 25 MG tablet Take 0.5 tablets (12.5 mg total) by mouth 2 (two) times daily. 10/18/16   Kathie Dike, MD  pantoprazole (PROTONIX) 40 MG tablet Take 1 tablet (40 mg total) by mouth daily. Patient taking differently: Take 40 mg by mouth daily as needed (acid reflux).  11/01/17   Isaac Bliss, Rayford Halsted, MD  sevelamer carbonate (RENVELA) 800 MG tablet Take 800 mg by mouth 3 (three) times daily with meals.  07/22/19   [provider]  sodium bicarbonate 650 MG tablet Take 1 tablet (650 mg total) by mouth 2 (two) times daily. 10/17/16   Orson Eva, MD  TRESIBA FLEXTOUCH 100 UNIT/ML FlexTouch Pen Inject 12 Units into the skin daily in the  afternoon.  07/23/19   [provider]    Physical Exam: Vitals:   12/27/19 1717 12/27/19 1730 12/27/19 1752 12/27/19 1800  BP: (!) 77/57  104/80 103/60  Pulse: 91  95 92  Resp: (!) 22 15 (!) 21 14  Temp: 97.8 F (36.6 C)  97.8 F (36.6 C)   TempSrc: Oral  Oral   SpO2:  90% 90% 95%  Weight:      Height:        Constitutional: NAD, calm, comfortable Vitals:   12/27/19 1717 12/27/19 1730 12/27/19 1752 12/27/19 1800  BP: (!) 77/57  104/80 103/60  Pulse: 91  95 92  Resp: (!) 22 15 (!) 21 14  Temp: 97.8 F (36.6 C)  97.8 F (36.6 C)   TempSrc: Oral  Oral   SpO2:  90% 90% 95%  Weight:      Height:       Eyes: PERRL, lids and conjunctivae normal ENMT: Mucous membranes are moist.  Neck: normal, supple, no masses, no thyromegaly Respiratory: clear to auscultation bilaterally, no wheezing, no crackles. Normal respiratory effort. No accessory muscle use.  Cardiovascular: Regular rate and rhythm, no murmurs / rubs / gallops. No extremity edema. 2+ pedal pulses.  Abdomen: no tenderness, no masses palpated. No hepatosplenomegaly. Musculoskeletal: no clubbing / cyanosis.  Deformity to 1st metacarpals areas bilaterally from arthritis. good ROM, no contractures. Normal muscle tone.  Skin: no rashes, lesions, ulcers. No induration Neurologic: No apparent cranial abnormality, moving extremities spontaneously. Psychiatric: Normal judgment and insight. Alert and oriented x 3. Normal mood.   Labs on Admission: I have personally reviewed following labs and imaging studies  CBC: Recent Labs  Lab 12/27/19 1327  WBC 7.2  HGB 9.7*  HCT 31.5*  MCV 115.4*  PLT 532   Basic Metabolic Panel: Recent Labs  Lab 12/27/19 1327  NA 135  K 4.3  CL 95*  CO2 26  GLUCOSE 141*  BUN 39*  CREATININE 6.00*  CALCIUM 9.7  MG 2.6*   Liver Function Tests: Recent Labs  Lab 12/27/19 1327  AST 24  ALT 18  ALKPHOS 74  BILITOT 0.9  PROT 6.7  ALBUMIN 3.6   Coagulation Profile: Recent  Labs  Lab 12/27/19 1327  INR 1.0    Radiological Exams on Admission: No results found.  EKG: Independently reviewed.  Atrial fibrillation.  QTc 412.  T wave changes in noncontiguous leads aVL and V3.   Assessment/Plan Principal Problem:   Rectal bleeding Active Problems:   Stroke with cerebral ischemia (HCC)   Essential hypertension   Type 2 diabetes mellitus with other circulatory complications (HCC)   TIA (transient ischemic attack)   COPD (chronic obstructive pulmonary disease) (HCC)   Atrial fibrillation (HCC)   Chronic diastolic CHF (congestive heart failure) (HCC)   Type 2 diabetes mellitus with nephropathy ([place])   ESRD on dialysis ([place])  Acute GI bleed with acute anemia-likely lower.  Hemoglobin 7.2.  Baseline ~ 10 - 11.  Hypotensive down to 69 systolic.  Last colonoscopy 2019 by Dr. Laural Golden- shows multiple diverticula in ascending colon and cecum, also burgundy and small clots in hepatic flexure.  EGD-Multiple, non-bleeding erosions were found in the gastric antrum and in the prepyloric region of the stomach. There were no stigmata of recent bleeding. Three healed ulcers was found in the gastric antrum.  No longer on anticoagulation last dose at least a month ago. -  2u prbc persistent hypotension -Monitor CBC Q6h x 3 -N.p.o  -IV Protonix 40 daily -Hold antihypertensive medications  Atrial fibrillation-chronic.  Rate controlled with diltiazem and metoprolol.  No longer on anticoagulation with Eliquis. -Holding diltiazem and metoprolol for now due to GI bleeding and significant hypotension.  Resume as soon as patient is stable.  Chronic diastolic CHF-stable and compensated.  Currently requiring fluids and blood.  Last Echo-  EF 60 to 65% with grade 2 diastolic dysfunction. -Per HD.  Diabetes mellitus-random glucose 141. - SSI- [place] -Hold Tresiba while n.p.o.  ESRD-scheduled Tuesday Thursday Saturday.  Only 30 minutes to 1 hour HD session today due to GI bleed.  Does not  make any urine. -Please consult nephrology in the morning -N.p.o. for now  Hypertension-hypotensive down to 69 systolic, improved now systolic in the low 992E. -Hold hydralazine, metoprolol, Cardizem  COPD-O2 sats fluctuating from 70s to 90s with or without  O2.  Patient reports she is on home O2 intermittently, and during dialysis. ??  She does not know how much O2. -Obtain ABG -DuoNebs as needed, resume home Advair  Stroke-stable. -Hold atorvastatin while n.p.o.  DVT prophylaxis: SCDs Code Status: Full code. Family Communication: Family at [employer]. Disposition Plan: ~ 2 days Consults called: GI Admission status: Inpatient, stepdown I certify that at the point of admission it is my clinical judgment that the patient will require inpatient hospital care spanning beyond 2 midnights from the point of admission due to high intensity of service, high risk for further deterioration and high frequency of surveillance required. The following factors support the patient status of inpatient: GI bleed with hypotension requiring blood transfusion and close monitoring.   Bethena Roys MD [facility]  12/27/2019, 7:32 PM

## 2019-12-27 NOTE — ED Provider Notes (Signed)
[facility] EMERGENCY DEPARTMENT Provider Note   CSN: [id number] Arrival date & time: 12/27/19  1253     History Chief Complaint  Patient presents with  . Rectal Bleeding    Jody [name] is a 79 y.o. female.  HPI   Patient with significant medical history of end-stage renal disease, TTS, COPD, hypertension, proximal A. fib, CHF, type 2 diabetes, anemia due to chronic disease presents to the emergency department with chief complaint of GI bleed.  Patient states she went to her dialysis treatment today and felt like she needed to have a bowel movement, she got up to use the bathroom and noticed she had a lot of blood coming from her rectum.  She states she has had 4 episodes of bloody diarrhea today and continues to have rectal bleeding.  She denies any recent traumas to the area, denies ever having rectal bleeding in the past, no history of hemorrhoids,  not on anticoags.  She denies abdominal pain, nausea, vomiting, abnormal bleeding of the gums, nose, or abnormal bruising.  Denies any alleviating or aggravating factors at this time.  After reviewing patient's medical record she had a colonoscopy and endoscopy performed in 2019 by Dr. Laural Golden.  Colonoscopy show blood and small clots at the hepatic flexure no active bleeding lesion noted.  Upper endoscopy shows duodenal erosions without bleeding.  Patient states she had 1 hour of her dialysis today and her full treatment on Thursday.  She denies shortness of breath, chest pain, worsening orthopnea, worsening pedal edema.  Patient denies headaches, fevers, chills, shortness of breath, chest pain,, abdominal pain, vomiting, worsening pedal edema. Past Medical History:  Diagnosis Date  . Anemia in chronic kidney disease (CODE) 06/05/2015  . Arthritis    "bad in my legs" (12/07/2014)  . Chronic back pain    "dr said my spine is crooked"  . Chronic bronchitis ([place])    "get it q yr" (12/07/2014)  . CKD stage 4 due to type 2 diabetes mellitus  ([place]) 06/05/2015  . Complication of anesthesia    headache for 2 days after surgery in May 2019  . COPD (chronic obstructive pulmonary disease) ([place])   . Gait difficulty 09/02/2014  . Hypercholesterolemia   . Hypertension   . Neuropathy 2015   in legs  . NSVT (nonsustained ventricular tachycardia) ([place])   . PAF (paroxysmal atrial fibrillation) ([place]) 06/05/2015  . Sinus brady-tachy syndrome ([place])   . Stroke ([place]) 05/2014   denies residual on 12/07/2014  . Type II diabetes mellitus [facility])     Patient Active Problem List   Diagnosis Date Noted  . Rectal bleeding 12/27/2019  . Nonsustained ventricular tachycardia ([place]) 10/27/2019  . Acute respiratory failure with hypoxia ([place]) 10/26/2019  . Megaloblastic anemia 10/26/2019  . Chronic blood loss anemia 10/26/2017  . Arthritis 10/26/2017  . Acute on chronic blood loss anemia 10/26/2017  . Iron deficiency anemia 02/23/2017  . Chronic diastolic CHF (congestive heart failure) ([place]) 10/15/2016  . Elevated troponin 10/15/2016  . Type 2 diabetes mellitus with nephropathy ([place]) 10/15/2016  . ESRD on dialysis ([place]) 10/15/2016  . NSTEMI (non-ST elevated myocardial infarction) ([place]) 10/03/2015  . Elevated LFTs 10/03/2015  . Encounter for therapeutic drug monitoring 07/29/2015  . COPD (chronic obstructive pulmonary disease) ([place]) 06/05/2015  . CKD stage 4 due to type 2 diabetes mellitus ([place]) 06/05/2015  . Anemia in chronic kidney disease, Stage IV 06/05/2015  . Acute on chronic diastolic CHF (congestive heart failure) ([place]) 06/05/2015  . Atrial fibrillation (  [place]) 06/05/2015  . Atrial fibrillation with rapid ventricular response ([place]) 12/07/2014  . TIA (transient ischemic attack) 09/02/2014  . Gait difficulty 09/02/2014  . Other specified transient cerebral ischemias   . Stroke with cerebral ischemia ([place])   . Hemispheric carotid artery syndrome   . Essential hypertension   . Hyperlipidemia   . Type 2 diabetes mellitus with other circulatory  complications ([place])   . Cerebral infarction ([place]) 05/27/2014    Past Surgical History:  Procedure Laterality Date  . ABDOMINAL HYSTERECTOMY  ~ 1970  . APPENDECTOMY  ~ 1970  . AV FISTULA PLACEMENT Right 07/06/2017   Procedure: RIGHT RADIOCEPHALIC ARTERIOVENOUS FISTULA creation;  Surgeon: Conrad Navajo, MD;  Location: [facility];  Service: Vascular;  Laterality: Right;  . BASCILIC VEIN TRANSPOSITION Right 09/17/2017   Procedure: SECOND STAGE BASILIC VEIN TRANSPOSITION RIGHT ARM;  Surgeon: Rosetta Posner, MD;  Location: [place];  Service: Vascular;  Laterality: Right;  . COLONOSCOPY N/A 10/28/2017   Procedure: COLONOSCOPY;  Surgeon: Rogene Houston, MD;  [facility];  Service: Endoscopy;  Laterality: N/A;  . ESOPHAGOGASTRODUODENOSCOPY (EGD) WITH PROPOFOL N/A 10/31/2017   Procedure: ESOPHAGOGASTRODUODENOSCOPY (EGD) WITH PROPOFOL;  Surgeon: Rogene Houston, MD;  [facility];  Service: Endoscopy;  Laterality: N/A;  . JOINT REPLACEMENT    . TOTAL KNEE ARTHROPLASTY Right ~ 1986  . TUMOR EXCISION  ~ 1970   "9# in my stomach"     OB History   No obstetric history on file.     Family History  Problem Relation Age of Onset  . Cancer Other   . Heart attack Brother   . Cancer Sister        breast  . Alcohol abuse Brother     Social History   Tobacco Use  . Smoking status: Former Smoker    Packs/day: 0.50    Years: 50.00    Pack years: 25.00    Types: Cigarettes    Start date: 03/07/1953    Quit date: 01/14/2004    Years since quitting: 15.9  . Smokeless tobacco: Never Used  . Tobacco comment: "quit smoking cigarettes  in ~ 2005"  Vaping Use  . Vaping Use: Never used  Substance Use Topics  . Alcohol use: No    Alcohol/week: 0.0 standard drinks    Comment: 10/242016 "quit drinking beer in ~ 1977"  . Drug use: No    Home Medications Prior to Admission medications   Medication Sig Start Date End Date Taking? Authorizing Provider  albuterol (ACCUNEB) 0.63 MG/3ML  nebulizer solution Take 1 ampule by nebulization every 6 (six) hours as needed for wheezing.    [provider]  albuterol (PROVENTIL HFA;VENTOLIN HFA) 108 (90 BASE) MCG/ACT inhaler Inhale 1-2 puffs into the lungs every 6 (six) hours as needed for wheezing or shortness of breath. Reported on 03/01/2015    [provider]  apixaban (ELIQUIS) 2.5 MG TABS tablet Take 1 tablet (2.5 mg total) by mouth 2 (two) times daily. 04/16/18   Dunn, Nedra Hai, PA-C  atorvastatin (LIPITOR) 40 MG tablet Take 1 tablet by mouth at bedtime.  01/22/16   [provider]  diltiazem (DILACOR XR) 180 MG 24 hr capsule Take 180 mg by mouth daily.    [provider]  fluticasone-salmeterol (ADVAIR HFA) 230-21 MCG/ACT inhaler Inhale 2 puffs into the lungs 2 (two) times daily.    [provider]  gabapentin (NEURONTIN) 100 MG capsule Take 100 mg by mouth 2 (  two) times daily.  07/23/19   [provider]  hydrALAZINE (APRESOLINE) 100 MG tablet TAKE ONE (1) TABLET THREE (3) TIMES EACH DAY Patient not taking: Reported on 10/27/2019 07/16/17   Herminio Commons, MD  magnesium hydroxide (MILK OF MAGNESIA) 400 MG/5ML suspension Take 15 mLs by mouth daily as needed for mild constipation.    [provider]  metoprolol tartrate (LOPRESSOR) 25 MG tablet Take 0.5 tablets (12.5 mg total) by mouth 2 (two) times daily. 10/18/16   Kathie Dike, MD  pantoprazole (PROTONIX) 40 MG tablet Take 1 tablet (40 mg total) by mouth daily. Patient taking differently: Take 40 mg by mouth daily as needed (acid reflux).  11/01/17   Isaac Bliss, Rayford Halsted, MD  sevelamer carbonate (RENVELA) 800 MG tablet Take 800 mg by mouth 3 (three) times daily with meals.  07/22/19   [provider]  sodium bicarbonate 650 MG tablet Take 1 tablet (650 mg total) by mouth 2 (two) times daily. 10/17/16   Orson Eva, MD  TRESIBA FLEXTOUCH 100 UNIT/ML FlexTouch Pen Inject 12 Units into the skin daily in the afternoon.   07/23/19   [provider]    Allergies    Codeine  Review of Systems   Review of Systems  Constitutional: Negative for chills and fever.  HENT: Negative for congestion and voice change.   Eyes: Negative for visual disturbance.  Respiratory: Negative for cough and shortness of breath.   Cardiovascular: Negative for chest pain.  Gastrointestinal: Positive for anal bleeding, blood in stool and diarrhea. Negative for abdominal pain, nausea, rectal pain and vomiting.  Genitourinary: Negative for decreased urine volume, dysuria and enuresis.  Musculoskeletal: Negative for back pain.  Skin: Negative for rash.  Neurological: Negative for dizziness and headaches.  Hematological: Does not bruise/bleed easily.    Physical Exam Updated Vital Signs BP 104/80   Pulse 95   Temp 97.8 F (36.6 C) (Oral)   Resp (!) 21   Ht 5\' 7"  (1.702 m)   Wt 96.6 kg   SpO2 90%   BMI 33.36 kg/m   Physical Exam Vitals and nursing note reviewed. Exam conducted with a chaperone present.  Constitutional:      General: She is not in acute distress.    Appearance: She is not ill-appearing.  HENT:     Head: Normocephalic and atraumatic.     Nose: No congestion.  Eyes:     Conjunctiva/sclera: Conjunctivae normal.  Cardiovascular:     Rate and Rhythm: Normal rate and regular rhythm.     Pulses: Normal pulses.     Heart sounds: No murmur heard.  No friction rub. No gallop.   Pulmonary:     Effort: No respiratory distress.     Breath sounds: No wheezing, rhonchi or rales.  Abdominal:     Palpations: Abdomen is soft.     Tenderness: There is no abdominal tenderness. There is no right CVA tenderness, left CVA tenderness or guarding.  Genitourinary:    Rectum: Guaiac result positive.     Comments: With chaperone present rectal exam was performed patient had noted external hemorrhoids that were nonthrombosed, nonerythematous no signs infection noted.  Digital rectal exam did not have any  stool  burden, but did have large amount of blood. Musculoskeletal:     Right lower leg: No edema.     Left lower leg: No edema.     Comments: Patient is moving all 4 extremities out difficulty.  Skin:    General:  Skin is warm and dry.  Neurological:     Mental Status: She is alert.  Psychiatric:        Mood and Affect: Mood normal.     ED Results / Procedures / Treatments   Labs (all labs ordered are listed, but only abnormal results are displayed) Labs Reviewed  COMPREHENSIVE METABOLIC PANEL - Abnormal; Notable for the following components:      Result Value   Chloride 95 (*)    Glucose, Bld 141 (*)    BUN 39 (*)    Creatinine, Ser 6.00 (*)    GFR, Estimated 7 (*)    All other components within normal limits  CBC - Abnormal; Notable for the following components:   RBC 2.73 (*)    Hemoglobin 9.7 (*)    HCT 31.5 (*)    MCV 115.4 (*)    MCH 35.5 (*)    All other components within normal limits  MAGNESIUM - Abnormal; Notable for the following components:   Magnesium 2.6 (*)    All other components within normal limits  POC OCCULT BLOOD, ED - Abnormal; Notable for the following components:   Fecal Occult Bld POSITIVE (*)    All other components within normal limits  RESPIRATORY PANEL BY RT PCR (FLU A&B, COVID)  PROTIME-INR  TYPE AND SCREEN  PREPARE RBC (CROSSMATCH)    EKG EKG Interpretation  Date/Time:  Saturday December 27 2019 16:14:11 EST Ventricular Rate:  89 PR Interval:    QRS Duration: 88 QT Interval:  376 QTC Calculation: 412 R Axis:   12 Text Interpretation: Atrial fibrillation Non-specific ST-t changes Confirmed by Lajean Saver [phone]) on 12/27/2019 4:20:55 PM   Radiology No results found.  Procedures .Critical Care Performed by: Marcello Fennel, PA-C Authorized by: Marcello Fennel, PA-C   Critical care provider statement:    Critical care time (minutes):  45   Critical care time was exclusive of:  Separately billable procedures and  treating other patients   Critical care was necessary to treat or prevent imminent or life-threatening deterioration of the following conditions:  Circulatory failure   Critical care was time spent personally by me on the following activities:  Discussions with consultants, evaluation of patient's response to treatment, examination of patient, ordering and performing treatments and interventions, ordering and review of laboratory studies, ordering and review of radiographic studies, pulse oximetry, re-evaluation of patient's condition, obtaining history from patient or surrogate and review of old charts   I assumed direction of critical care for this patient from another provider in my specialty: no     (including critical care time)  Medications Ordered in ED Medications  0.9 %  sodium chloride infusion (10 mL/hr Intravenous New Bag/Given 12/27/19 1639)    ED Course  I have reviewed the triage vital signs and the nursing notes.  Pertinent labs & imaging results that were available during my care of the patient were reviewed by me and considered in my medical decision making (see chart for details).    MDM Rules/Calculators/A&P                          Patient presents to the emergency department with chief complaint of rectal bleeding.  She is alert, does not appear in acute distress, vital signs significant for soft BPs 100 /50, with elevated heart rate of 90-100.  Will obtain screening labs, EKG and reevaluate.    With chaperone present rectal exam was  performed external hemorrhoids visualized none are thrombosed no signs of erythema noted.  Digital rectal exam was performed no internal hemorrhoids felt, large amount of blood present. Will start patient on blood transfusion as she is elderly with concerning blood loss from her rectum.  Patient was reevaluated after providing blood she continues to stay stable, denies any complaints at this time.  Due to continued rectal bleeding will  consult with GI for further evaluation management.  Spoke with Dr. Abbey Chatters of GI and he feels patient should be admitted to medicine for further evaluation.  He will come and assess the patient tomorrow, he recommends clear liquid diet at this time.  Spoke with Dr. Su Ley who agrees patient should be admitted for further evaluation.  She will come and assess the patient.  CBC shows no signs of leukocytosis, microcytic anemia slightly decreased from baseline.  CMP negative for electrolyte abnormalities, no metabolic acidosis, hyperglycemia of 141, elevated BUN and creatinine, no elevated liver enzymes, no anion gap noted.  Patient had positive Hemoccults, respiratory panel negative for Covid, influenza a/B.,  Hyper magnesium 2.6.  I have low suspicion for systemic infection as patient is nontoxic-appearing, vital signs reassuring, no obvious source infection noted on exam.  I have low suspicion patient will need emergent hemodialysis as there is no severe electrolyte derailment, no signs of respiratory distress, no signs of fluid overload noted on exam.  Low suspicion for acute intra-abdominal abnormality requiring immediate attention as abdomen was soft nontender to palpation, no peritoneal sign, patient is tolerating p.o. without difficulty.  I suspect patient suffering from probable GI bleed from diverticulosis will anticipate she will need further blood transfusion and observation.  Patient care will be transferred to hospice team for further evaluation management.   Final Clinical Impression(s) / ED Diagnoses Final diagnoses:  Rectal bleeding  Acute GI bleeding  ESRD on dialysis [facility])    Rx / DC Orders ED Discharge Orders    None       Marcello Fennel, PA-C 12/27/19 1800    Lajean Saver, MD 12/27/19 2125

## 2019-12-27 NOTE — ED Notes (Signed)
Pts hands are cold. Pulse ox has been moved to several places. Oxygen reads 97% at times.

## 2019-12-27 NOTE — ED Triage Notes (Signed)
Pt brought to ED via [facility] for bright red rectal bleeding started after dialysis today. Pt denies abdominal pain.

## 2019-12-28 DIAGNOSIS — N186 End stage renal disease: Secondary | ICD-10-CM

## 2019-12-28 DIAGNOSIS — I5032 Chronic diastolic (congestive) heart failure: Secondary | ICD-10-CM

## 2019-12-28 DIAGNOSIS — I10 Essential (primary) hypertension: Secondary | ICD-10-CM

## 2019-12-28 DIAGNOSIS — I4891 Unspecified atrial fibrillation: Secondary | ICD-10-CM

## 2019-12-28 DIAGNOSIS — E1121 Type 2 diabetes mellitus with diabetic nephropathy: Secondary | ICD-10-CM

## 2019-12-28 DIAGNOSIS — Z992 Dependence on renal dialysis: Secondary | ICD-10-CM

## 2019-12-28 LAB — CBC
HCT: 30.1 % — ABNORMAL LOW (ref 36.0–46.0)
HCT: 28.5 % — ABNORMAL LOW (ref 36.0–46.0)
HCT: 31.4 % — ABNORMAL LOW (ref 36.0–46.0)
Hemoglobin: 9.6 g/dL — ABNORMAL LOW (ref 12.0–15.0)
Hemoglobin: 9.2 g/dL — ABNORMAL LOW (ref 12.0–15.0)
Hemoglobin: 10.2 g/dL — ABNORMAL LOW (ref 12.0–15.0)
MCH: 34.3 pg — ABNORMAL HIGH (ref 26.0–34.0)
MCH: 33.9 pg (ref 26.0–34.0)
MCH: 34.6 pg — ABNORMAL HIGH (ref 26.0–34.0)
MCHC: 31.9 g/dL (ref 30.0–36.0)
MCHC: 32.3 g/dL (ref 30.0–36.0)
MCHC: 32.5 g/dL (ref 30.0–36.0)
MCV: 107.5 fL — ABNORMAL HIGH (ref 80.0–100.0)
MCV: 105.2 fL — ABNORMAL HIGH (ref 80.0–100.0)
MCV: 106.4 fL — ABNORMAL HIGH (ref 80.0–100.0)
Platelets: 115 10*3/uL — ABNORMAL LOW (ref 150–400)
Platelets: 127 10*3/uL — ABNORMAL LOW (ref 150–400)
Platelets: 141 10*3/uL — ABNORMAL LOW (ref 150–400)
RBC: 2.8 MIL/uL — ABNORMAL LOW (ref 3.87–5.11)
RBC: 2.71 MIL/uL — ABNORMAL LOW (ref 3.87–5.11)
RBC: 2.95 MIL/uL — ABNORMAL LOW (ref 3.87–5.11)
RDW: 18.6 % — ABNORMAL HIGH (ref 11.5–15.5)
RDW: 18.4 % — ABNORMAL HIGH (ref 11.5–15.5)
RDW: 18.3 % — ABNORMAL HIGH (ref 11.5–15.5)
WBC: 7 10*3/uL (ref 4.0–10.5)
WBC: 6.8 10*3/uL (ref 4.0–10.5)
WBC: 7.4 10*3/uL (ref 4.0–10.5)
nRBC: 0 % (ref 0.0–0.2)
nRBC: 0 % (ref 0.0–0.2)
nRBC: 0 % (ref 0.0–0.2)

## 2019-12-28 LAB — BASIC METABOLIC PANEL
Anion gap: 10 (ref 5–15)
BUN: 48 mg/dL — ABNORMAL HIGH (ref 8–23)
CO2: 26 mmol/L (ref 22–32)
Calcium: 8.6 mg/dL — ABNORMAL LOW (ref 8.9–10.3)
Chloride: 98 mmol/L (ref 98–111)
Creatinine, Ser: 6.75 mg/dL — ABNORMAL HIGH (ref 0.44–1.00)
GFR, Estimated: 6 mL/min — ABNORMAL LOW (ref >60)
Glucose, Bld: 120 mg/dL — ABNORMAL HIGH (ref 70–99)
Potassium: 4 mmol/L (ref 3.5–5.1)
Sodium: 134 mmol/L — ABNORMAL LOW (ref 135–145)

## 2019-12-28 LAB — TYPE AND SCREEN
ABO/RH(D): O POS
Antibody Screen: NEGATIVE
Unit division: 0
Unit division: 0

## 2019-12-28 LAB — BPAM RBC
Blood Product Expiration Date: 202112232359
Blood Product Expiration Date: 202112232359
ISSUE DATE / TIME: 202111131711
ISSUE DATE / TIME: 202111132050
Unit Type and Rh: 5100
Unit Type and Rh: 5100

## 2019-12-28 LAB — GLUCOSE, CAPILLARY: Glucose-Capillary: 121 mg/dL — ABNORMAL HIGH (ref 70–99)

## 2019-12-28 LAB — MRSA PCR SCREENING: MRSA by PCR: NEGATIVE

## 2019-12-28 LAB — HEMOGLOBIN A1C
Hgb A1c MFr Bld: 5.1 % (ref 4.8–5.6)
Mean Plasma Glucose: 99.67 mg/dL

## 2019-12-28 MED ORDER — POLYETHYLENE GLYCOL 3350 17 G PO PACK
51.0000 g | PACK | Freq: Once | ORAL | Status: AC
  Administered 2019-12-28: 51 g via ORAL
  Filled 2019-12-28: qty 3

## 2019-12-28 MED ORDER — INSULIN ASPART 100 UNIT/ML ~~LOC~~ SOLN
0.0000 [IU] | Freq: Three times a day (TID) | SUBCUTANEOUS | Status: DC
  Administered 2019-12-28 – 2019-12-30 (×2): 1 [IU] via SUBCUTANEOUS
  Administered 2019-12-31: 2 [IU] via SUBCUTANEOUS

## 2019-12-28 MED ORDER — CHLORHEXIDINE GLUCONATE CLOTH 2 % EX PADS
6.0000 | MEDICATED_PAD | Freq: Every day | CUTANEOUS | Status: DC
  Administered 2019-12-28 – 2019-12-31 (×4): 6 via TOPICAL

## 2019-12-28 MED ORDER — PEG 3350-KCL-NA BICARB-NACL 420 G PO SOLR
4000.0000 mL | Freq: Once | ORAL | Status: AC
  Administered 2019-12-28: 4000 mL via ORAL

## 2019-12-28 NOTE — Progress Notes (Signed)
PROGRESS NOTE    Jody [name]  YBO:[id number] DOB: 1940-03-28 DOA: 12/27/2019 PCP: Monico Blitz, MD    Brief Narrative:  79 year old female with a history of end-stage renal disease on hemodialysis, atrial fibrillation, diabetes, diastolic heart failure, is brought to the emergency room after having several episodes of bright red blood per rectum.  Symptoms started during dialysis.  She was noted to be hypotensive on arrival.  She was transfused 2 units of PRBC and she was found to have a hemoglobin of 7.2.  Patient was admitted for further GI evaluation.   Assessment & Plan:   Principal Problem:   Rectal bleeding Active Problems:   Stroke with cerebral ischemia [facility])   Essential hypertension   Type 2 diabetes mellitus with other circulatory complications (HCC)   TIA (transient ischemic attack)   COPD (chronic obstructive pulmonary disease) (HCC)   Atrial fibrillation (HCC)   Chronic diastolic CHF (congestive heart failure) (HCC)   Type 2 diabetes mellitus with nephropathy (HCC)   ESRD on dialysis ([place])   Acute GI bleeding   Acute GI bleeding -Etiology is unclear, but suspect lower GI bleeding -Gastroenterology following -Patient was noted to be hypotensive on arrival, blood pressure has improved with IV fluids and PRBC transfusion -Plans are for colonoscopy on 11/15  Acute blood loss anemia -Patient was noted to be hypotensive been having frequent bloody stools -Hemoglobin down to 7.2 on admission -She was transfused 2 units PRBC on 11/13 -Follow-up hemoglobin has been stable  End-stage renal disease on hemodialysis -Tuesday, Thursday, Saturday -She had partial treatment on 11/13 -Volume status appears to be acceptable and electrolytes are normal range -No indication for dialysis today -Nephrology consulted  Diabetes, insulin-dependent -Blood sugars currently stable -Continue on sliding scale insulin  Atrial fibrillation, chronic -She is on rate control with  diltiazem metoprolol, although these were held due to hypotension on arrival -Not a candidate for anticoagulation due to recurrent bleeding  Chronic diastolic congestive heart failure. -Appears compensated -Volume status managed with dialysis  COPD -No wheezing or shortness of breath time. -Continue bronchodilators as needed   DVT prophylaxis: SCDs Start: 12/27/19 1949  Code Status: Full code Family Communication: Updated niece [name], [place] over the phone Disposition Plan: Status is: Inpatient  Remains inpatient appropriate because:Inpatient level of care appropriate due to severity of illness   Dispo: The patient is from: Home              Anticipated d/c is to: TBD              Anticipated d/c date is: 2 days              Patient currently is not medically stable to d/c.    Consultants:   Gastroenterology  Procedures:     Antimicrobials:       Subjective: No dizziness or lightheadedness.  No chest pain.  Objective: Vitals:   12/28/19 1400 12/28/19 1500 12/28/19 1600 12/28/19 1700  BP:      Pulse:  84 63 87  Resp: 20 15 18 13   Temp:   97.9 F (36.6 C)   TempSrc:   Oral   SpO2:  100% 95% 98%  Weight:      Height:        Intake/Output Summary (Last 24 hours) at 12/28/2019 1809 Last data filed at 12/28/2019 0301 Gross per 24 hour  Intake 945 ml  Output --  Net 945 ml   Filed Weights   12/27/19 1302 12/28/19 0027  Weight: 96.6 kg 90.8 kg    Examination:  General exam: Appears calm and comfortable  Respiratory system: Clear to auscultation. Respiratory effort normal. Cardiovascular system: S1 & S2 heard, RRR. No JVD, murmurs, rubs, gallops or clicks. No pedal edema. Gastrointestinal system: Abdomen is nondistended, soft and nontender. No organomegaly or masses felt. Normal bowel sounds heard. Central nervous system: Alert and oriented. No focal neurological deficits. Extremities: Symmetric 5 x 5 power. Skin: No rashes, lesions or  ulcers Psychiatry: Judgement and insight appear normal. Mood & affect appropriate.     Data Reviewed: I have personally reviewed following labs and imaging studies  CBC: Recent Labs  Lab 12/27/19 1327 12/28/19 0111 12/28/19 0521 12/28/19 1308  WBC 7.2 7.0 6.8 7.4  HGB 9.7* 9.6* 9.2* 10.2*  HCT 31.5* 30.1* 28.5* 31.4*  MCV 115.4* 107.5* 105.2* 106.4*  PLT 176 115* 127* 035*   Basic Metabolic Panel: Recent Labs  Lab 12/27/19 1327 12/28/19 0521  NA 135 134*  K 4.3 4.0  CL 95* 98  CO2 26 26  GLUCOSE 141* 120*  BUN 39* 48*  CREATININE 6.00* 6.75*  CALCIUM 9.7 8.6*  MG 2.6*  --    GFR: Estimated Creatinine Clearance: 7.6 mL/min (A) (by C-G formula based on SCr of 6.75 mg/dL (H)). Liver Function Tests: Recent Labs  Lab 12/27/19 1327  AST 24  ALT 18  ALKPHOS 74  BILITOT 0.9  PROT 6.7  ALBUMIN 3.6   No results for input(s): LIPASE, AMYLASE in the last 168 hours. No results for input(s): AMMONIA in the last 168 hours. Coagulation Profile: Recent Labs  Lab 12/27/19 1327  INR 1.0   Cardiac Enzymes: No results for input(s): CKTOTAL, CKMB, CKMBINDEX, TROPONINI in the last 168 hours. BNP (last 3 results) No results for input(s): PROBNP in the last 8760 hours. HbA1C: No results for input(s): HGBA1C in the last 72 hours. CBG: Recent Labs  Lab 12/28/19 1652  GLUCAP 121*   Lipid Profile: No results for input(s): CHOL, HDL, LDLCALC, TRIG, CHOLHDL, LDLDIRECT in the last 72 hours. Thyroid Function Tests: No results for input(s): TSH, T4TOTAL, FREET4, T3FREE, THYROIDAB in the last 72 hours. Anemia Panel: No results for input(s): VITAMINB12, FOLATE, FERRITIN, TIBC, IRON, RETICCTPCT in the last 72 hours. Sepsis Labs: No results for input(s): PROCALCITON, LATICACIDVEN in the last 168 hours.  Recent Results (from the past 240 hour(s))  Respiratory Panel by RT PCR (Flu A&B, Covid) - Nasopharyngeal Swab     Status: None   Collection Time: 12/27/19  3:58 PM    Specimen: Nasopharyngeal Swab  Result Value Ref Range Status   SARS Coronavirus 2 by RT PCR NEGATIVE NEGATIVE Final    Comment: (NOTE) SARS-CoV-2 target nucleic acids are NOT DETECTED.  The SARS-CoV-2 RNA is generally detectable in upper respiratoy specimens during the acute phase of infection. The lowest concentration of SARS-CoV-2 viral copies this assay can detect is 131 copies/mL. A negative result does not preclude SARS-Cov-2 infection and should not be used as the sole basis for treatment or other patient management decisions. A negative result may occur with  improper specimen collection/handling, submission of specimen other than nasopharyngeal swab, presence of viral mutation(s) within the areas targeted by this assay, and inadequate number of viral copies (<131 copies/mL). A negative result must be combined with clinical observations, patient history, and epidemiological information. The expected result is Negative.  Fact Sheet for Patients:  PinkCheek.be  Fact Sheet for Healthcare Providers:  GravelBags.it  This test is no t  yet approved or cleared by the [place] and  has been authorized for detection and/or diagnosis of SARS-CoV-2 by FDA under an Emergency Use Authorization (EUA). This EUA will remain  in effect (meaning this test can be used) for the duration of the COVID-19 declaration under Section 564(b)(1) of the Act, 21 U.S.C. section 360bbb-3(b)(1), unless the authorization is terminated or revoked sooner.     Influenza A by PCR NEGATIVE NEGATIVE Final   Influenza B by PCR NEGATIVE NEGATIVE Final    Comment: (NOTE) The Xpert Xpress SARS-CoV-2/FLU/RSV assay is intended as an aid in  the diagnosis of influenza from Nasopharyngeal swab specimens and  should not be used as a sole basis for treatment. Nasal washings and  aspirates are unacceptable for Xpert Xpress SARS-CoV-2/FLU/RSV   testing.  Fact Sheet for Patients: PinkCheek.be  Fact Sheet for Healthcare Providers: GravelBags.it  This test is not yet approved or cleared by the [place] FDA and  has been authorized for detection and/or diagnosis of SARS-CoV-2 by  FDA under an Emergency Use Authorization (EUA). This EUA will remain  in effect (meaning this test can be used) for the duration of the  Covid-19 declaration under Section 564(b)(1) of the Act, 21  U.S.C. section 360bbb-3(b)(1), unless the authorization is  terminated or revoked. Performed at [facility], [address], [place], [place] [address]   MRSA PCR Screening     Status: None   Collection Time: 12/28/19 12:19 AM   Specimen: Nasopharyngeal  Result Value Ref Range Status   MRSA by PCR NEGATIVE NEGATIVE Final    Comment:        The GeneXpert MRSA Assay (FDA approved for NASAL specimens only), is one component of a comprehensive MRSA colonization surveillance program. It is not intended to diagnose MRSA infection nor to guide or monitor treatment for MRSA infections. Performed at [facility], [address], [place], [place] [address]          Radiology Studies: No results found.      Scheduled Meds: . sodium chloride   Intravenous Once  . Chlorhexidine Gluconate Cloth  6 each Topical Daily  . insulin aspart  0-9 Units Subcutaneous TID WC  . mometasone-formoterol  2 puff Inhalation BID  . pantoprazole (PROTONIX) IV  40 mg Intravenous Q24H   Continuous Infusions:   LOS: 1 day    Time spent: 45mins    Kathie Dike, MD [facility]   If 7PM-7AM, please contact night-coverage [email or web address]  12/28/2019, 6:09 PM

## 2019-12-28 NOTE — Progress Notes (Signed)
Patient placed on bedpan this morning. No measurable output in the pan but did have a small amount of bloody liquid when wiped. Will continue to monitorl

## 2019-12-28 NOTE — Consult Note (Signed)
Consulting  Provider: Dr. Denton Brick Primary Care Physician:  Monico Blitz, MD Primary Gastroenterologist:  Dr. Laural Golden   Reason for Consultation:  GI bleed  HPI:  Jody [name] is a 79 y.o. female with a past medical history of ESRD on hemodialysis, atrial fibrillation, COPD, diastolic heart failure, diverticulosis, who presented to [facility] ER yesterday with hematochezia.  Patient states she was at dialysis when she got the "urge" to use the bathroom.  She went to the restroom and had a bowel movement which was all blood.  She then went back to dialysis chair and had the sensation again.  The dialysis nurse followed her to the bathroom this time and noted a large amount of fresh blood in the toilet bowl.  She was then sent to the ER for evaluation.  She notes a total of 5 episodes of bright red blood per rectum in regards to dialysis and ER.  No abdominal pain.  No rectal pain.  No recent NSAID use.  No history of PUD or H. pylori that she knows of.  No melena.  She is chronically on Eliquis though she states she has been off of it for nearly a month.  On evaluation the ER, patient was found to be hypotensive with BP 69/50.  Hemoglobin 7.2 with baseline around 10.  She has received 2 units of PRBCs and is doing better.  Hemoglobin currently 9, blood pressure stable.  She has been started on IV Protonix.  Last colonoscopy 2019 showed numerous diverticula in the ascending colon and cecum.  EGD around that time showed nonbleeding erosions in the gastric antrum and healed ulcers.  Past Medical History:  Diagnosis Date  . Anemia in chronic kidney disease (CODE) 06/05/2015  . Arthritis    "bad in my legs" (12/07/2014)  . Chronic back pain    "dr said my spine is crooked"  . Chronic bronchitis ([place])    "get it q yr" (12/07/2014)  . CKD stage 4 due to type 2 diabetes mellitus ([place]) 06/05/2015  . Complication of anesthesia    headache for 2 days after surgery in May 2019  . COPD (chronic  obstructive pulmonary disease) ([place])   . Gait difficulty 09/02/2014  . Hypercholesterolemia   . Hypertension   . Neuropathy 2015   in legs  . NSVT (nonsustained ventricular tachycardia) ([place])   . PAF (paroxysmal atrial fibrillation) ([place]) 06/05/2015  . Sinus brady-tachy syndrome ([place])   . Stroke ([place]) 05/2014   denies residual on 12/07/2014  . Type II diabetes mellitus ([place])     Past Surgical History:  Procedure Laterality Date  . ABDOMINAL HYSTERECTOMY  ~ 1970  . APPENDECTOMY  ~ 1970  . AV FISTULA PLACEMENT Right 07/06/2017   Procedure: RIGHT RADIOCEPHALIC ARTERIOVENOUS FISTULA creation;  Surgeon: Conrad Brenton, MD;  Location: [facility];  Service: Vascular;  Laterality: Right;  . BASCILIC VEIN TRANSPOSITION Right 09/17/2017   Procedure: SECOND STAGE BASILIC VEIN TRANSPOSITION RIGHT ARM;  Surgeon: Rosetta Posner, MD;  Location: [facility];  Service: Vascular;  Laterality: Right;  . COLONOSCOPY N/A 10/28/2017   Procedure: COLONOSCOPY;  Surgeon: Rogene Houston, MD;  [facility];  Service: Endoscopy;  Laterality: N/A;  . ESOPHAGOGASTRODUODENOSCOPY (EGD) WITH PROPOFOL N/A 10/31/2017   Procedure: ESOPHAGOGASTRODUODENOSCOPY (EGD) WITH PROPOFOL;  Surgeon: Rogene Houston, MD;  [facility];  Service: Endoscopy;  Laterality: N/A;  . JOINT REPLACEMENT    . TOTAL KNEE ARTHROPLASTY Right ~ 1986  . TUMOR EXCISION  ~  1970   "9# in my stomach"    Prior to Admission medications   Medication Sig Start Date End Date Taking? Authorizing Provider  albuterol (ACCUNEB) 0.63 MG/3ML nebulizer solution Take 1 ampule by nebulization every 6 (six) hours as needed for wheezing.    [provider]  albuterol (PROVENTIL HFA;VENTOLIN HFA) 108 (90 BASE) MCG/ACT inhaler Inhale 1-2 puffs into the lungs every 6 (six) hours as needed for wheezing or shortness of breath. Reported on 03/01/2015    [provider]  apixaban (ELIQUIS) 2.5 MG TABS tablet Take 1 tablet (2.5 mg total) by mouth 2  (two) times daily. 04/16/18   Dunn, Nedra Hai, PA-C  atorvastatin (LIPITOR) 40 MG tablet Take 1 tablet by mouth at bedtime.  01/22/16   [provider]  diltiazem (DILACOR XR) 180 MG 24 hr capsule Take 180 mg by mouth daily.    [provider]  fluticasone-salmeterol (ADVAIR HFA) 230-21 MCG/ACT inhaler Inhale 2 puffs into the lungs 2 (two) times daily.    [provider]  gabapentin (NEURONTIN) 100 MG capsule Take 100 mg by mouth 2 (two) times daily.  07/23/19   [provider]  hydrALAZINE (APRESOLINE) 100 MG tablet TAKE ONE (1) TABLET THREE (3) TIMES EACH DAY Patient not taking: Reported on 10/27/2019 07/16/17   Herminio Commons, MD  magnesium hydroxide (MILK OF MAGNESIA) 400 MG/5ML suspension Take 15 mLs by mouth daily as needed for mild constipation.    [provider]  metoprolol tartrate (LOPRESSOR) 25 MG tablet Take 0.5 tablets (12.5 mg total) by mouth 2 (two) times daily. 10/18/16   Kathie Dike, MD  pantoprazole (PROTONIX) 40 MG tablet Take 1 tablet (40 mg total) by mouth daily. Patient taking differently: Take 40 mg by mouth daily as needed (acid reflux).  11/01/17   Isaac Bliss, Rayford Halsted, MD  sevelamer carbonate (RENVELA) 800 MG tablet Take 800 mg by mouth 3 (three) times daily with meals.  07/22/19   [provider]  sodium bicarbonate 650 MG tablet Take 1 tablet (650 mg total) by mouth 2 (two) times daily. 10/17/16   Orson Eva, MD  TRESIBA FLEXTOUCH 100 UNIT/ML FlexTouch Pen Inject 12 Units into the skin daily in the afternoon.  07/23/19   [provider]    Current Facility-Administered Medications  Medication Dose Route Frequency Provider Last Rate Last Admin  . 0.9 %  sodium chloride infusion (Manually program via Guardrails IV Fluids)   Intravenous Once Bethena Roys, MD   Held at 12/27/19 2041  . Chlorhexidine Gluconate Cloth 2 % PADS 6 each  6 each Topical Daily Emokpae, Ejiroghene E, MD   6 each at 12/28/19 0834   . mometasone-formoterol (DULERA) 200-5 MCG/ACT inhaler 2 puff  2 puff Inhalation BID Emokpae, Ejiroghene E, MD   2 puff at 12/28/19 0833  . ondansetron (ZOFRAN) tablet 4 mg  4 mg Oral Q6H PRN Emokpae, Ejiroghene E, MD       Or  . ondansetron (ZOFRAN) injection 4 mg  4 mg Intravenous Q6H PRN Emokpae, Ejiroghene E, MD      . pantoprazole (PROTONIX) injection 40 mg  40 mg Intravenous Q24H Emokpae, Ejiroghene E, MD   40 mg at 12/27/19 1953    Allergies as of 12/27/2019 - Review Complete 12/27/2019  Allergen Reaction Noted  . Codeine Nausea And Vomiting 12/07/2014    Family History  Problem Relation Age of Onset  . Cancer Other   . Heart attack Brother   .  Cancer Sister        breast  . Alcohol abuse Brother     Social History   Socioeconomic History  . Marital status: Widowed    Spouse name: Not on file  . Number of children: 0  . Years of education: 9  . Highest education level: Not on file  Occupational History  . Occupation: retired  Tobacco Use  . Smoking status: Former Smoker    Packs/day: 0.50    Years: 50.00    Pack years: 25.00    Types: Cigarettes    Start date: 03/07/1953    Quit date: 01/14/2004    Years since quitting: 15.9  . Smokeless tobacco: Never Used  . Tobacco comment: "quit smoking cigarettes  in ~ 2005"  Vaping Use  . Vaping Use: Never used  Substance and Sexual Activity  . Alcohol use: No    Alcohol/week: 0.0 standard drinks    Comment: 10/242016 "quit drinking beer in ~ 1977"  . Drug use: No  . Sexual activity: Never  Other Topics Concern  . Not on file  Social History Narrative   Patient drinks caffeine a few times a week.   Patient is right handed.   Social Determinants of Health   Financial Resource Strain:   . Difficulty of Paying Living Expenses: Not on file  Food Insecurity:   . Worried About Charity fundraiser in the Last Year: Not on file  . Ran Out of Food in the Last Year: Not on file  Transportation Needs:   . Lack of  Transportation (Medical): Not on file  . Lack of Transportation (Non-Medical): Not on file  Physical Activity:   . Days of Exercise per Week: Not on file  . Minutes of Exercise per Session: Not on file  Stress:   . Feeling of Stress : Not on file  Social Connections:   . Frequency of Communication with Friends and Family: Not on file  . Frequency of Social Gatherings with Friends and Family: Not on file  . Attends Religious Services: Not on file  . Active Member of Clubs or Organizations: Not on file  . Attends Archivist Meetings: Not on file  . Marital Status: Not on file  Intimate Partner Violence:   . Fear of Current or Ex-Partner: Not on file  . Emotionally Abused: Not on file  . Physically Abused: Not on file  . Sexually Abused: Not on file    Review of Systems: General: Negative for anorexia, weight loss, fever, chills, fatigue, weakness. Eyes: Negative for vision changes.  ENT: Negative for hoarseness, difficulty swallowing , nasal congestion. CV: Negative for chest pain, angina, palpitations, dyspnea on exertion, peripheral edema.  Respiratory: Negative for dyspnea at rest, dyspnea on exertion, cough, sputum, wheezing.  GI: See history of present illness. GU:  Negative for dysuria, hematuria, urinary incontinence, urinary frequency, nocturnal urination.  MS: Negative for joint pain, low back pain.  Derm: Negative for rash or itching.  Neuro: Negative for weakness, abnormal sensation, seizure, frequent headaches, memory loss, confusion.  Psych: Negative for anxiety, depression, suicidal ideation, hallucinations.  Endo: Negative for unusual weight change.  Heme: Negative for bruising or bleeding. Allergy: Negative for rash or hives.  Physical Exam: Vital signs in last 24 hours: Temp:  [97.5 F (36.4 C)-97.9 F (36.6 C)] 97.5 F (36.4 C) (11/14 0800) Pulse Rate:  [29-105] 98 (11/14 0900) Resp:  [12-22] 22 (11/14 0900) BP: (69-131)/(31-81) 131/59 (11/14  0900) SpO2:  [68 %-  100 %] 99 % (11/14 0900) Weight:  [90.8 kg-96.6 kg] 90.8 kg (11/14 0027) Last BM Date: 12/28/19 General:   Alert,  Well-developed, well-nourished, pleasant and cooperative in NAD Head:  Normocephalic and atraumatic. Eyes:  Sclera clear, no icterus.   Conjunctiva pink. Ears:  Normal auditory acuity. Nose:  No deformity, discharge,  or lesions. Mouth:  No deformity or lesions, dentition normal. Neck:  Supple; no masses or thyromegaly. Lungs:  Clear throughout to auscultation.   No wheezes, crackles, or rhonchi. No acute distress. Heart:  Regular rate and rhythm; no murmurs, clicks, rubs,  or gallops. Abdomen:  Soft, nontender and nondistended. No masses, hepatosplenomegaly or hernias noted. Normal bowel sounds, without guarding, and without rebound.   Rectal:  Deferred until time of colonoscopy.   Msk:  Symmetrical without gross deformities. Normal posture. Pulses:  Normal pulses noted. Extremities:  Without clubbing or edema. Neurologic:  Alert and  oriented x4;  grossly normal neurologically. Skin:  Intact without significant lesions or rashes. Cervical Nodes:  No significant cervical adenopathy. Psych:  Alert and cooperative. Normal mood and affect.  Intake/Output from previous day: 11/13 0701 - 11/14 0700 In: 945 [Blood:945] Out: -  Intake/Output this shift: No intake/output data recorded.  Lab Results: Recent Labs    12/27/19 1327 12/28/19 0111 12/28/19 0521  WBC 7.2 7.0 6.8  HGB 9.7* 9.6* 9.2*  HCT 31.5* 30.1* 28.5*  PLT 176 115* 127*   BMET Recent Labs    12/27/19 1327 12/28/19 0521  NA 135 134*  K 4.3 4.0  CL 95* 98  CO2 26 26  GLUCOSE 141* 120*  BUN 39* 48*  CREATININE 6.00* 6.75*  CALCIUM 9.7 8.6*   LFT Recent Labs    12/27/19 1327  PROT 6.7  ALBUMIN 3.6  AST 24  ALT 18  ALKPHOS 74  BILITOT 0.9   PT/INR Recent Labs    12/27/19 1327  LABPROT 12.8  INR 1.0   Hepatitis Panel No results for input(s): HEPBSAG, HCVAB,  HEPAIGM, HEPBIGM in the last 72 hours. C-Diff No results for input(s): CDIFFTOX in the last 72 hours.  Studies/Results: No results found.  Impression: *Acute GI bleed-source likely lower *Acute on chronic blood loss anemia due to above *History of diverticulosis *History of gastritis  Plan: Etiology of patient's lower GI bleed unclear.  Clinically it is acting like a diverticular bleed especially given her history.  Other differential includes hemorrhoidal, AVMs, polyps, malignancy, or other.  We will prep patient's colon in anticipation of colonoscopy tomorrow..The risks including infection, bleed, or perforation as well as benefits, limitations, alternatives and imponderables have been reviewed with the patient. Questions have been answered. All parties agreeable.  Continue on clear liquids today.  N.p.o. after midnight. Continue monitor hemoglobin and transfuse for less than 7. Okay to continue on IV Protonix twice daily. GI continue to follow  I will call and update patient's niece and healthcare power of attorney [name] [phone].  Elon Alas. Abbey Chatters, D.O. Gastroenterology and Hepatology [facility]    LOS: 1 day     12/28/2019, 12:24 PM

## 2019-12-29 ENCOUNTER — Inpatient Hospital Stay (HOSPITAL_COMMUNITY): Payer: Medicare Other | Admitting: Anesthesiology

## 2019-12-29 ENCOUNTER — Encounter (HOSPITAL_COMMUNITY): Payer: Self-pay | Admitting: Internal Medicine

## 2019-12-29 ENCOUNTER — Encounter (HOSPITAL_COMMUNITY)
Admission: EM | Disposition: A | Discharge status: Home / Self Care | Payer: Self-pay | Source: Home / Self Care | Attending: Internal Medicine

## 2019-12-29 DIAGNOSIS — K922 Gastrointestinal hemorrhage, unspecified: Secondary | ICD-10-CM

## 2019-12-29 HISTORY — PX: COLONOSCOPY WITH PROPOFOL: SHX5780

## 2019-12-29 LAB — GLUCOSE, CAPILLARY
Glucose-Capillary: 85 mg/dL (ref 70–99)
Glucose-Capillary: 67 mg/dL — ABNORMAL LOW (ref 70–99)
Glucose-Capillary: 53 mg/dL — ABNORMAL LOW (ref 70–99)
Glucose-Capillary: 115 mg/dL — ABNORMAL HIGH (ref 70–99)
Glucose-Capillary: 71 mg/dL (ref 70–99)
Glucose-Capillary: 98 mg/dL (ref 70–99)

## 2019-12-29 LAB — CBC
HCT: 28.8 % — ABNORMAL LOW (ref 36.0–46.0)
Hemoglobin: 9.1 g/dL — ABNORMAL LOW (ref 12.0–15.0)
MCH: 34.3 pg — ABNORMAL HIGH (ref 26.0–34.0)
MCHC: 31.6 g/dL (ref 30.0–36.0)
MCV: 108.7 fL — ABNORMAL HIGH (ref 80.0–100.0)
Platelets: 127 10*3/uL — ABNORMAL LOW (ref 150–400)
RBC: 2.65 MIL/uL — ABNORMAL LOW (ref 3.87–5.11)
RDW: 17.7 % — ABNORMAL HIGH (ref 11.5–15.5)
WBC: 6.8 10*3/uL (ref 4.0–10.5)
nRBC: 0 % (ref 0.0–0.2)

## 2019-12-29 LAB — BASIC METABOLIC PANEL
Anion gap: 13 (ref 5–15)
BUN: 52 mg/dL — ABNORMAL HIGH (ref 8–23)
CO2: 23 mmol/L (ref 22–32)
Calcium: 8.7 mg/dL — ABNORMAL LOW (ref 8.9–10.3)
Chloride: 96 mmol/L — ABNORMAL LOW (ref 98–111)
Creatinine, Ser: 7.25 mg/dL — ABNORMAL HIGH (ref 0.44–1.00)
GFR, Estimated: 5 mL/min — ABNORMAL LOW (ref >60)
Glucose, Bld: 98 mg/dL (ref 70–99)
Potassium: 4.5 mmol/L (ref 3.5–5.1)
Sodium: 132 mmol/L — ABNORMAL LOW (ref 135–145)

## 2019-12-29 LAB — HEMOGLOBIN AND HEMATOCRIT, BLOOD
HCT: 31.2 % — ABNORMAL LOW (ref 36.0–46.0)
Hemoglobin: 8.9 g/dL — ABNORMAL LOW (ref 12.0–15.0)

## 2019-12-29 SURGERY — COLONOSCOPY WITH PROPOFOL
Anesthesia: General

## 2019-12-29 MED ORDER — SODIUM CHLORIDE 0.9 % IV SOLN: INTRAVENOUS | Status: DC

## 2019-12-29 MED ORDER — CHLORHEXIDINE GLUCONATE CLOTH 2 % EX PADS: 6.0000 | MEDICATED_PAD | Freq: Every day | CUTANEOUS | Status: DC

## 2019-12-29 MED ORDER — ACETAMINOPHEN 325 MG PO TABS
650.0000 mg | ORAL_TABLET | Freq: Four times a day (QID) | ORAL | Status: DC | PRN
  Administered 2019-12-30 (×2): 650 mg via ORAL
  Filled 2019-12-29 (×3): qty 2

## 2019-12-29 MED ORDER — PHENYLEPHRINE HCL (PRESSORS) 10 MG/ML IV SOLN
INTRAVENOUS | Status: DC | PRN
  Administered 2019-12-29 (×2): 80 ug via INTRAVENOUS

## 2019-12-29 MED ORDER — SODIUM CHLORIDE 0.9% IV SOLUTION: Freq: Once | INTRAVENOUS | Status: DC

## 2019-12-29 MED ORDER — PROPOFOL 10 MG/ML IV BOLUS
INTRAVENOUS | Status: DC | PRN
  Administered 2019-12-29: 30 mg via INTRAVENOUS

## 2019-12-29 MED ORDER — IPRATROPIUM-ALBUTEROL 0.5-2.5 (3) MG/3ML IN SOLN
3.0000 mL | Freq: Once | RESPIRATORY_TRACT | Status: AC
  Administered 2019-12-29: 3 mL via RESPIRATORY_TRACT

## 2019-12-29 MED ORDER — STERILE WATER FOR IRRIGATION IR SOLN
Status: DC | PRN
  Administered 2019-12-29: 1.5 mL

## 2019-12-29 MED ORDER — DEXTROSE 50 % IV SOLN
50.0000 mL | Freq: Once | INTRAVENOUS | Status: AC
  Administered 2019-12-29: 50 mL via INTRAVENOUS

## 2019-12-29 MED ORDER — PROPOFOL 500 MG/50ML IV EMUL
INTRAVENOUS | Status: DC | PRN
  Administered 2019-12-29: 150 ug/kg/min via INTRAVENOUS

## 2019-12-29 NOTE — Interval H&P Note (Signed)
History and Physical Interval Note:  12/29/2019 2:49 PM  Jody [name]  has presented today for surgery, with the diagnosis of rectal bleeding.  The various methods of treatment have been discussed with the patient and family. After consideration of risks, benefits and other options for treatment, the patient has consented to  Procedure(s): COLONOSCOPY WITH PROPOFOL (N/A) as a surgical intervention.  The patient's history has been reviewed, patient examined, no change in status, stable for surgery.  I have reviewed the patient's chart and labs.  Questions were answered to the patient's satisfaction.     Eloise Harman

## 2019-12-29 NOTE — Op Note (Signed)
[facility] Patient [name] Procedure Date: 12/29/2019 3:13 PM MRN: [id number] Date of Birth: February 03, 1941 Attending MD: Elon Alas. Abbey Chatters DO CSN: [id number] Age: 79 Admit Type: Inpatient Procedure:                Colonoscopy Indications:              Rectal bleeding Providers:                Elon Alas. Abbey Chatters, DO, Charlsie Quest. Theda Sers RN, RN,                            Caprice Kluver, Randa Spike, Technician Referring MD:              Medicines:                See the Anesthesia note for documentation of the                            administered medications Complications:            No immediate complications. Estimated Blood Loss:     Estimated blood loss was minimal. Procedure:                Pre-Anesthesia Assessment:                           - The anesthesia plan was to use monitored                            anesthesia care (MAC).                           After obtaining informed consent, the colonoscope                            was passed under direct vision. Throughout the                            procedure, the patient's blood pressure, pulse, and                            oxygen saturations were monitored continuously. The                            [id number]-HQ190L ([id number]) scope was introduced through                            the anus and advanced to the the cecum, identified                            by appendiceal orifice and ileocecal valve. The                            colonoscopy was performed without difficulty. The                            patient tolerated the procedure well.  The quality                            of the bowel preparation was evaluated using the                            BBPS [facility] Bowel Preparation Scale) with scores                            of: Right Colon = 2 (minor amount of residual                            staining, small fragments of stool and/or opaque                            liquid, but mucosa seen  well), Transverse Colon = 2                            (minor amount of residual staining, small fragments                            of stool and/or opaque liquid, but mucosa seen                            well) and Left Colon = 2 (minor amount of residual                            staining, small fragments of stool and/or opaque                            liquid, but mucosa seen well). The total BBPS score                            equals 6. The quality of the bowel preparation was                            fair. Scope In: 4:21:44 PM Scope Out: 4:44:05 PM Scope Withdrawal Time: 0 hours 14 minutes 17 seconds  Total Procedure Duration: 0 hours 22 minutes 21 seconds  Findings:      Red blood was found in the entire colon.      Many small and large-mouthed diverticula were found in the entire colon.      One medium-mouthed diverticulum was found in the ascending colon. There       was active bleeding coming from the diverticular opening. For       hemostasis, two hemostatic clips were successfully placed (MR       conditional). There was no bleeding at the end of the procedure. Impression:               - Preparation of the colon was fair.                           - Blood in the entire examined colon.                           -  Diverticulosis in the entire examined colon.                           - Diverticulosis in the ascending colon. There was                            active bleeding coming from the diverticular                            opening. Clips (MR conditional) were placed.                           - No specimens collected. Moderate Sedation:      Per Anesthesia Care Recommendation:           - Return patient to hospital ward for ongoing care.                           - Advance diet as tolerated.                           - Continue present medications.                           - Check STAT hemoglobin and transfuse for <7.                            Monitor  overnight. Patient will likely have further                            washout over the next 24-48 hours. If has active                            bleeding, I would consider CTA/Tagged RBC scan and                            potential IR. Procedure Code(s):        --- Professional ---                           [phone], Colonoscopy, flexible; with control of                            bleeding, any method Diagnosis Code(s):        --- Professional ---                           K57.31, Diverticulosis of large intestine without                            perforation or abscess with bleeding                           K92.2, Gastrointestinal hemorrhage, unspecified  K62.5, Hemorrhage of anus and rectum CPT copyright 2019 American Medical Association. All rights reserved. The codes documented in this report are preliminary and upon coder review may  be revised to meet current compliance requirements. Elon Alas. Abbey Chatters, DO [place] Abbey Chatters, DO 12/29/2019 4:57:23 PM This report has been signed electronically. Number of Addenda: 0

## 2019-12-29 NOTE — Progress Notes (Signed)
Spoke with niece, [name] at [phone]. Informed of upcoming colonoscopy. All questions answered.

## 2019-12-29 NOTE — Transfer of Care (Signed)
Immediate Anesthesia Transfer of Care Note  Patient: Jody [name]  Procedure(s) Performed: COLONOSCOPY WITH PROPOFOL (N/A )  Patient Location: PACU  Anesthesia Type:General  Level of Consciousness: awake  Airway & Oxygen Therapy: Patient Spontanous Breathing  Post-op Assessment: Report given to RN  Post vital signs: Reviewed  Last Vitals:  Vitals Value Taken Time  BP 116/69 12/29/19 1652  Temp    Pulse    Resp 3 12/29/19 1657  SpO2    Vitals shown include unvalidated device data.  Last Pain:  Vitals:   12/29/19 1610  TempSrc:   PainSc: 0-No pain         Complications: No complications documented.

## 2019-12-29 NOTE — Progress Notes (Addendum)
    Subjective: Still with rectal bleeding but decreased amount. No abdominal pain. No N/V. Continues to drink colon prep.   Objective: Vital signs in last 24 hours: Temp:  [97.4 F (36.3 C)-98 F (36.7 C)] 97.7 F (36.5 C) (11/15 0716) Pulse Rate:  [56-119] 119 (11/15 0716) Resp:  [11-22] 14 (11/15 0716) BP: (116-146)/(48-78) 138/54 (11/15 0400) SpO2:  [94 %-100 %] 97 % (11/15 0754) Last BM Date: 12/28/19 General:   Alert and oriented, pleasant Head:  Normocephalic and atraumatic. Abdomen:  Bowel sounds present, soft, non-tender, non-distended. Extremities:  Without  edema. Neurologic:  Alert and  oriented x4;  Psych:  Alert and cooperative. Normal mood and affect.  Intake/Output from previous day: No intake/output data recorded. Intake/Output this shift: No intake/output data recorded.  Lab Results: Recent Labs    12/28/19 0521 12/28/19 1308 12/29/19 0531  WBC 6.8 7.4 6.8  HGB 9.2* 10.2* 9.1*  HCT 28.5* 31.4* 28.8*  PLT 127* 141* 127*   BMET Recent Labs    12/27/19 1327 12/28/19 0521 12/29/19 0531  NA 135 134* 132*  K 4.3 4.0 4.5  CL 95* 98 96*  CO2 26 26 23   GLUCOSE 141* 120* 98  BUN 39* 48* 52*  CREATININE 6.00* 6.75* 7.25*  CALCIUM 9.7 8.6* 8.7*   LFT Recent Labs    12/27/19 1327  PROT 6.7  ALBUMIN 3.6  AST 24  ALT 18  ALKPHOS 74  BILITOT 0.9   PT/INR Recent Labs    12/27/19 1327  LABPROT 12.8  INR 1.0    Assessment: Very pleasant 79 year old female presenting with acute lower GI bleeding, acute blood loss anemia s/p 2 units PRBCs, with presentation concerning for diverticular bleed. Hgb 9.1 this morning, and she reports bleeding has tapered in amount but still present. Continues to work on colonoscopy prep, having completed about 60% of prep as of this morning.  Will continue to prep and make NPO after 12pm today, with plans for colonoscopy early afternoon.   I will reach out to patient's niece this morning (Jody [name]) [phone] with  update.   Plan: Continue Golytely NPO at noon Colonoscopy with Dr. Abbey Chatters today Nephrology consultation pending for dialysis Further recommendations to follow   Jody Needs, PhD, ANP-BC [facility]    LOS: 2 days    12/29/2019, 8:18 AM

## 2019-12-29 NOTE — H&P (View-Only) (Signed)
Spoke with niece, [name] at [phone]. Informed of upcoming colonoscopy. All questions answered.

## 2019-12-29 NOTE — Anesthesia Postprocedure Evaluation (Signed)
Anesthesia Post Note  Patient: Solash Tullo  Procedure(s) Performed: COLONOSCOPY WITH PROPOFOL (N/A )  Patient location during evaluation: PACU Anesthesia Type: General Level of consciousness: awake and alert and oriented Pain management: pain level controlled Vital Signs Assessment: post-procedure vital signs reviewed and stable Respiratory status: spontaneous breathing Cardiovascular status: blood pressure returned to baseline and stable Postop Assessment: no apparent nausea or vomiting Anesthetic complications: no   No complications documented.   Last Vitals:  Vitals:   12/29/19 1358 12/29/19 1419  BP:  (!) 140/91  Pulse:  92  Resp: 16 20  Temp:  36.4 C  SpO2:  93%    Last Pain:  Vitals:   12/29/19 1610  TempSrc:   PainSc: 0-No pain                 Maurisha Mongeau

## 2019-12-29 NOTE — Anesthesia Preprocedure Evaluation (Addendum)
Anesthesia Evaluation  Patient identified by MRN, date of birth, ID band Patient awake    Reviewed: Allergy & Precautions, NPO status , Patient's Chart, lab work & pertinent test results  History of Anesthesia Complications (+) history of anesthetic complications (headeche for 2 days after sx)  Airway Mallampati: II  TM Distance: >3 FB Neck ROM: Full    Dental  (+) Edentulous Upper, Edentulous Lower   Pulmonary COPD (uses oxygen as needed at home),  COPD inhaler and oxygen dependent, former smoker,    Pulmonary exam normal breath sounds clear to auscultation       Cardiovascular Exercise Tolerance: Poor hypertension, Pt. on medications + Past MI and +CHF  + dysrhythmias Atrial Fibrillation  Rhythm:Irregular Rate:Normal  2016- stress test  T wave inversion was noted during stress in the V4, V5 and V6 leads, beginning at 1 minutes of stress, ending at 7 minutes of stress, and returning to baseline after 5-9 mins of recovery.  ST segment depression was noted during stress in the V4, V5, V6 and II leads, beginning at 1 minutes of stress, ending at 7 minutes of stress.  Defect 1: There is a medium defect of moderate severity present in the basal inferoseptal, basal inferior and mid inferior location.  The left ventricular ejection fraction is mildly decreased (45-54%).  This is a low risk study.  Significant diaphragmatic attenuation and extracardiac uptake, but cannot rule out ischemia in the basal to mid inferior and inferoseptal walls. Wall motion is normal, so favor artifact.   Neuro/Psych TIACVA, Residual Symptoms    GI/Hepatic GERD  Medicated,Lower GI bleeding   Endo/Other  diabetes, Type 2  Renal/GU ESRF and DialysisRenal disease     Musculoskeletal  (+) Arthritis ,   Abdominal   Peds  Hematology  (+) anemia ,   Anesthesia Other Findings Left ventricle: The cavity size was normal. Wall thickness was   increased in a pattern of mild LVH. Systolic function was normal.  The estimated ejection fraction was in the range of 60% to 65%.  Wall motion was normal; there were no regional wall motion  abnormalities. Features are consistent with a pseudonormal left  ventricular filling pattern, with concomitant abnormal relaxation  and increased filling pressure (grade 2 diastolic dysfunction).  - Aortic valve: Mildly calcified annulus. Trileaflet; mildly  calcified leaflets. There was trivial regurgitation. Mean  gradient (S): 10 mm Hg. Valve area (VTI): 1.92 cm^2.  - Mitral valve: Mildly calcified annulus. There was trivial  regurgitation.  - Left atrium: The atrium was moderately dilated.  - Right atrium: Central venous pressure (est): 3 mm Hg.  - Tricuspid valve: There was trivial regurgitation.  - Pulmonary arteries: Systolic pressure could not be accurately  estimated.  - Pericardium, extracardiac: There was no pericardial effusion.   Reproductive/Obstetrics                            Anesthesia Physical Anesthesia Plan  ASA: IV  Anesthesia Plan: General   Post-op Pain Management:    Induction: Intravenous  PONV Risk Score and Plan: TIVA  Airway Management Planned: Nasal Cannula, Natural Airway and Simple Face Mask  Additional Equipment:   Intra-op Plan:   Post-operative Plan:   Informed Consent: I have reviewed the patients History and Physical, chart, labs and discussed the procedure including the risks, benefits and alternatives for the proposed anesthesia with the patient or authorized representative who has indicated his/her understanding and acceptance.  Dental advisory given  Plan Discussed with: CRNA and Surgeon  Anesthesia Plan Comments:        Anesthesia Quick Evaluation

## 2019-12-29 NOTE — Progress Notes (Signed)
PROGRESS NOTE    Jody [name]  VOZ:[id number] DOB: 11/18/1940 DOA: 12/27/2019 PCP: Monico Blitz, MD    Brief Narrative:  79 year old female with a history of end-stage renal disease on hemodialysis, atrial fibrillation, diabetes, diastolic heart failure, is brought to the emergency room after having several episodes of bright red blood per rectum.  Symptoms started during dialysis.  She was noted to be hypotensive on arrival.  She was transfused 2 units of PRBC and she was found to have a hemoglobin of 7.2.  Patient was admitted for further GI evaluation.   Assessment & Plan:   Principal Problem:   Rectal bleeding Active Problems:   Stroke with cerebral ischemia [facility])   Essential hypertension   Type 2 diabetes mellitus with other circulatory complications (HCC)   TIA (transient ischemic attack)   COPD (chronic obstructive pulmonary disease) (HCC)   Atrial fibrillation (HCC)   Chronic diastolic CHF (congestive heart failure) (HCC)   Type 2 diabetes mellitus with nephropathy (HCC)   ESRD on dialysis ([place])   Acute GI bleeding   Acute GI bleeding -Etiology is unclear, but suspect lower GI bleeding -Gastroenterology following -Patient was noted to be hypotensive on arrival, blood pressure has improved with IV fluids and PRBC transfusion -Colonoscopy on 11/15 showed active bleeding from diverticuli which was clipped  Acute blood loss anemia -Patient was noted to be hypotensive and had been having frequent bloody stools -Hemoglobin down to 7.2 on admission -She was transfused 2 units PRBC on 11/13 -Follow-up hemoglobin has been stable  End-stage renal disease on hemodialysis -Tuesday, Thursday, Saturday -She had partial treatment on 11/13 -Nephrology consulted  Diabetes, insulin-dependent -Blood sugars currently stable -Continue on sliding scale insulin  Atrial fibrillation, chronic -She is on rate control with diltiazem metoprolol, although these were held due to  hypotension on arrival -Not a candidate for anticoagulation due to recurrent bleeding  Chronic diastolic congestive heart failure. -Appears compensated -Volume status managed with dialysis  COPD -No wheezing or shortness of breath time. -Continue bronchodilators as needed   DVT prophylaxis: SCDs Start: 12/27/19 1949  Code Status: Full code Family Communication: Updated niece [name], [place] over the phone Disposition Plan: Status is: Inpatient  Remains inpatient appropriate because:Inpatient level of care appropriate due to severity of illness   Dispo: The patient is from: Home              Anticipated d/c is to: TBD              Anticipated d/c date is: 2 days              Patient currently is not medically stable to d/c.    Consultants:   Gastroenterology  Procedures:  Colonoscopy: Preparation of the colon was fair.                           - Blood in the entire examined colon.                           - Diverticulosis in the entire examined colon.                           - Diverticulosis in the ascending colon. There was  active bleeding coming from the diverticular                            opening. Clips (MR conditional) were placed.                            - No specimens collected  Antimicrobials:       Subjective: Continues to have blood in stool.  No vomiting.  Objective: Vitals:   12/29/19 1358 12/29/19 1419 12/29/19 1652 12/29/19 1708  BP:  (!) 140/91 116/69 118/71  Pulse:  92 90   Resp: 16 20 12  (!) 28  Temp:  97.6 F (36.4 C) (!) 97.5 F (36.4 C)   TempSrc:  Oral    SpO2:  93% 94%   Weight:      Height:        Intake/Output Summary (Last 24 hours) at 12/29/2019 1852 Last data filed at 12/29/2019 1707 Gross per 24 hour  Intake 600 ml  Output 0 ml  Net 600 ml   Filed Weights   12/27/19 1302 12/28/19 0027  Weight: 96.6 kg 90.8 kg    Examination:  General exam: Alert, awake, oriented x  3 Respiratory system: Clear to auscultation. Respiratory effort normal. Cardiovascular system:RRR. No murmurs, rubs, gallops. Gastrointestinal system: Abdomen is nondistended, soft and nontender. No organomegaly or masses felt. Normal bowel sounds heard. Central nervous system: Alert and oriented. No focal neurological deficits. Extremities: No C/C/E, +pedal pulses Skin: No rashes, lesions or ulcers Psychiatry: Judgement and insight appear normal. Mood & affect appropriate.     Data Reviewed: I have personally reviewed following labs and imaging studies  CBC: Recent Labs  Lab 12/27/19 1327 12/27/19 1327 12/28/19 0111 12/28/19 0521 12/28/19 1308 12/29/19 0531 12/29/19 1757  WBC 7.2  --  7.0 6.8 7.4 6.8  --   HGB 9.7*   < > 9.6* 9.2* 10.2* 9.1* 8.9*  HCT 31.5*   < > 30.1* 28.5* 31.4* 28.8* 31.2*  MCV 115.4*  --  107.5* 105.2* 106.4* 108.7*  --   PLT 176  --  115* 127* 141* 127*  --    < > = values in this interval not displayed.   Basic Metabolic Panel: Recent Labs  Lab 12/27/19 1327 12/28/19 0521 12/29/19 0531  NA 135 134* 132*  K 4.3 4.0 4.5  CL 95* 98 96*  CO2 26 26 23   GLUCOSE 141* 120* 98  BUN 39* 48* 52*  CREATININE 6.00* 6.75* 7.25*  CALCIUM 9.7 8.6* 8.7*  MG 2.6*  --   --    GFR: Estimated Creatinine Clearance: 7 mL/min (A) (by C-G formula based on SCr of 7.25 mg/dL (H)). Liver Function Tests: Recent Labs  Lab 12/27/19 1327  AST 24  ALT 18  ALKPHOS 74  BILITOT 0.9  PROT 6.7  ALBUMIN 3.6   No results for input(s): LIPASE, AMYLASE in the last 168 hours. No results for input(s): AMMONIA in the last 168 hours. Coagulation Profile: Recent Labs  Lab 12/27/19 1327  INR 1.0   Cardiac Enzymes: No results for input(s): CKTOTAL, CKMB, CKMBINDEX, TROPONINI in the last 168 hours. BNP (last 3 results) No results for input(s): PROBNP in the last 8760 hours. HbA1C: Recent Labs    12/28/19 0111  HGBA1C 5.1   CBG: Recent Labs  Lab 12/29/19 0715  12/29/19 1106 12/29/19 1425 12/29/19 1505 12/29/19 1654  GLUCAP 85 67* 53*  115* 71   Lipid Profile: No results for input(s): CHOL, HDL, LDLCALC, TRIG, CHOLHDL, LDLDIRECT in the last 72 hours. Thyroid Function Tests: No results for input(s): TSH, T4TOTAL, FREET4, T3FREE, THYROIDAB in the last 72 hours. Anemia Panel: No results for input(s): VITAMINB12, FOLATE, FERRITIN, TIBC, IRON, RETICCTPCT in the last 72 hours. Sepsis Labs: No results for input(s): PROCALCITON, LATICACIDVEN in the last 168 hours.  Recent Results (from the past 240 hour(s))  Respiratory Panel by RT PCR (Flu A&B, Covid) - Nasopharyngeal Swab     Status: None   Collection Time: 12/27/19  3:58 PM   Specimen: Nasopharyngeal Swab  Result Value Ref Range Status   SARS Coronavirus 2 by RT PCR NEGATIVE NEGATIVE Final    Comment: (NOTE) SARS-CoV-2 target nucleic acids are NOT DETECTED.  The SARS-CoV-2 RNA is generally detectable in upper respiratoy specimens during the acute phase of infection. The lowest concentration of SARS-CoV-2 viral copies this assay can detect is 131 copies/mL. A negative result does not preclude SARS-Cov-2 infection and should not be used as the sole basis for treatment or other patient management decisions. A negative result may occur with  improper specimen collection/handling, submission of specimen other than nasopharyngeal swab, presence of viral mutation(s) within the areas targeted by this assay, and inadequate number of viral copies (<131 copies/mL). A negative result must be combined with clinical observations, patient history, and epidemiological information. The expected result is Negative.  Fact Sheet for Patients:  PinkCheek.be  Fact Sheet for Healthcare Providers:  GravelBags.it  This test is no t yet approved or cleared by the [place] FDA and  has been authorized for detection and/or diagnosis of SARS-CoV-2  by FDA under an Emergency Use Authorization (EUA). This EUA will remain  in effect (meaning this test can be used) for the duration of the COVID-19 declaration under Section 564(b)(1) of the Act, 21 U.S.C. section 360bbb-3(b)(1), unless the authorization is terminated or revoked sooner.     Influenza A by PCR NEGATIVE NEGATIVE Final   Influenza B by PCR NEGATIVE NEGATIVE Final    Comment: (NOTE) The Xpert Xpress SARS-CoV-2/FLU/RSV assay is intended as an aid in  the diagnosis of influenza from Nasopharyngeal swab specimens and  should not be used as a sole basis for treatment. Nasal washings and  aspirates are unacceptable for Xpert Xpress SARS-CoV-2/FLU/RSV  testing.  Fact Sheet for Patients: PinkCheek.be  Fact Sheet for Healthcare Providers: GravelBags.it  This test is not yet approved or cleared by the [place] FDA and  has been authorized for detection and/or diagnosis of SARS-CoV-2 by  FDA under an Emergency Use Authorization (EUA). This EUA will remain  in effect (meaning this test can be used) for the duration of the  Covid-19 declaration under Section 564(b)(1) of the Act, 21  U.S.C. section 360bbb-3(b)(1), unless the authorization is  terminated or revoked. Performed at [facility], [address], [place], [place] [address]   MRSA PCR Screening     Status: None   Collection Time: 12/28/19 12:19 AM   Specimen: Nasopharyngeal  Result Value Ref Range Status   MRSA by PCR NEGATIVE NEGATIVE Final    Comment:        The GeneXpert MRSA Assay (FDA approved for NASAL specimens only), is one component of a comprehensive MRSA colonization surveillance program. It is not intended to diagnose MRSA infection nor to guide or monitor treatment for MRSA infections. Performed at [facility], [address] [place], [place] [address]  Radiology Studies: No results found.      Scheduled Meds: .  sodium chloride   Intravenous Once  . sodium chloride   Intravenous Once  . Chlorhexidine Gluconate Cloth  6 each Topical Daily  . Chlorhexidine Gluconate Cloth  6 each Topical Q0600  . insulin aspart  0-9 Units Subcutaneous TID WC  . mometasone-formoterol  2 puff Inhalation BID  . pantoprazole (PROTONIX) IV  40 mg Intravenous Q24H   Continuous Infusions:   LOS: 2 days    Time spent: 60mins    Kathie Dike, MD [facility]   If 7PM-7AM, please contact night-coverage [email or web address]  12/29/2019, 6:52 PM

## 2019-12-29 NOTE — Consult Note (Signed)
[facility] Renal Consultation Note    Indication for Consultation:  Management of ESRD/hemodialysis; anemia, hypertension/volume and secondary hyperparathyroidism  HPI: Jody [name] is a 79 y.o. female with a PMH significant for HTN, DM, COPD, P. A fib, CVA, and ESRD who presented to [facility] via EMS after she was noted to have bloody diarrhea x 2 episodes at dialysis on 12/27/19.  In the ED she was hypotensive with BP 69/50 and Hgb of 7.2.  She had another 4 episodes of bloody stools and was admitted for blood transfusion and further workup.  We were consulted to provide HD during her hospitalization.   Past Medical History:  Diagnosis Date  . Anemia in chronic kidney disease (CODE) 06/05/2015  . Arthritis    "bad in my legs" (12/07/2014)  . Chronic back pain    "dr said my spine is crooked"  . Chronic bronchitis ([place])    "get it q yr" (12/07/2014)  . CKD stage 4 due to type 2 diabetes mellitus ([place]) 06/05/2015  . Complication of anesthesia    headache for 2 days after surgery in May 2019  . COPD (chronic obstructive pulmonary disease) ([place])   . Gait difficulty 09/02/2014  . Hypercholesterolemia   . Hypertension   . Neuropathy 2015   in legs  . NSVT (nonsustained ventricular tachycardia) ([place])   . PAF (paroxysmal atrial fibrillation) ([place]) 06/05/2015  . Sinus brady-tachy syndrome ([place])   . Stroke ([place]) 05/2014   denies residual on 12/07/2014  . Type II diabetes mellitus ([place])    Past Surgical History:  Procedure Laterality Date  . ABDOMINAL HYSTERECTOMY  ~ 1970  . APPENDECTOMY  ~ 1970  . AV FISTULA PLACEMENT Right 07/06/2017   Procedure: RIGHT RADIOCEPHALIC ARTERIOVENOUS FISTULA creation;  Surgeon: Conrad Zenda, MD;  Location: [place];  Service: Vascular;  Laterality: Right;  . BASCILIC VEIN TRANSPOSITION Right 09/17/2017   Procedure: SECOND STAGE BASILIC VEIN TRANSPOSITION RIGHT ARM;  Surgeon: Rosetta Posner, MD;  Location: [place];  Service: Vascular;  Laterality: Right;   . COLONOSCOPY N/A 10/28/2017   Procedure: COLONOSCOPY;  Surgeon: Rogene Houston, MD;  [facility];  Service: Endoscopy;  Laterality: N/A;  . ESOPHAGOGASTRODUODENOSCOPY (EGD) WITH PROPOFOL N/A 10/31/2017   Procedure: ESOPHAGOGASTRODUODENOSCOPY (EGD) WITH PROPOFOL;  Surgeon: Rogene Houston, MD;  [facility];  Service: Endoscopy;  Laterality: N/A;  . JOINT REPLACEMENT    . TOTAL KNEE ARTHROPLASTY Right ~ 1986  . TUMOR EXCISION  ~ 1970   "9# in my stomach"   Family History:   Family History  Problem Relation Age of Onset  . Cancer Other   . Heart attack Brother   . Cancer Sister        breast  . Alcohol abuse Brother    Social History:  reports that she quit smoking about 15 years ago. Her smoking use included cigarettes. She started smoking about 66 years ago. She has a 25.00 pack-year smoking history. She has never used smokeless tobacco. She reports that she does not drink alcohol and does not use drugs. Allergies  Allergen Reactions  . Codeine Nausea And Vomiting   Prior to Admission medications   Medication Sig Start Date End Date Taking? Authorizing Provider  albuterol (ACCUNEB) 0.63 MG/3ML nebulizer solution Take 1 ampule by nebulization every 6 (six) hours as needed for wheezing.   Yes [provider]  albuterol (PROVENTIL HFA;VENTOLIN HFA) 108 (90 BASE) MCG/ACT inhaler Inhale 1-2 puffs into the lungs  every 6 (six) hours as needed for wheezing or shortness of breath. Reported on 03/01/2015   Yes [provider]  apixaban (ELIQUIS) 2.5 MG TABS tablet Take 1 tablet (2.5 mg total) by mouth 2 (two) times daily. 04/16/18  Yes Dunn, Dayna N, PA-C  atorvastatin (LIPITOR) 40 MG tablet Take 1 tablet by mouth at bedtime.  01/22/16  Yes [provider]  diltiazem (DILACOR XR) 180 MG 24 hr capsule Take 180 mg by mouth daily.   Yes [provider]  gabapentin (NEURONTIN) 100 MG capsule Take 100 mg by mouth 2 (two) times daily.  07/23/19   Yes [provider]  magnesium hydroxide (MILK OF MAGNESIA) 400 MG/5ML suspension Take 15 mLs by mouth daily as needed for mild constipation.   Yes [provider]  metoprolol tartrate (LOPRESSOR) 25 MG tablet Take 0.5 tablets (12.5 mg total) by mouth 2 (two) times daily. 10/18/16  Yes Kathie Dike, MD  pantoprazole (PROTONIX) 40 MG tablet Take 1 tablet (40 mg total) by mouth daily. Patient taking differently: Take 40 mg by mouth daily as needed (acid reflux).  11/01/17  Yes Isaac Bliss, Rayford Halsted, MD  sevelamer carbonate (RENVELA) 800 MG tablet Take 800 mg by mouth 3 (three) times daily with meals.  07/22/19  Yes [provider]  TRESIBA FLEXTOUCH 100 UNIT/ML FlexTouch Pen Inject 12 Units into the skin daily in the afternoon.  07/23/19  Yes [provider]  fluticasone-salmeterol (ADVAIR HFA) 230-21 MCG/ACT inhaler Inhale 2 puffs into the lungs 2 (two) times daily.    [provider]  sodium bicarbonate 650 MG tablet Take 1 tablet (650 mg total) by mouth 2 (two) times daily. Patient not taking: Reported on 12/28/2019 10/17/16   Orson Eva, MD   Current Facility-Administered Medications  Medication Dose Route Frequency Provider Last Rate Last Admin  . 0.9 %  sodium chloride infusion (Manually program via Guardrails IV Fluids)   Intravenous Once Bethena Roys, MD   Held at 12/27/19 2041  . 0.9 %  sodium chloride infusion (Manually program via Guardrails IV Fluids)   Intravenous Once Annitta Needs, NP   Held at 12/29/19 [phone]  . Chlorhexidine Gluconate Cloth 2 % PADS 6 each  6 each Topical Daily Emokpae, Ejiroghene E, MD   6 each at 12/29/19 [phone]  . insulin aspart (novoLOG) injection 0-9 Units  0-9 Units Subcutaneous TID WC Kathie Dike, MD   1 Units at 12/28/19 1701  . mometasone-formoterol (DULERA) 200-5 MCG/ACT inhaler 2 puff  2 puff Inhalation BID Emokpae, Ejiroghene E, MD   2 puff at 12/29/19 0754  . ondansetron (ZOFRAN) tablet 4 mg  4 mg Oral  Q6H PRN Emokpae, Ejiroghene E, MD       Or  . ondansetron (ZOFRAN) injection 4 mg  4 mg Intravenous Q6H PRN Emokpae, Ejiroghene E, MD      . pantoprazole (PROTONIX) injection 40 mg  40 mg Intravenous Q24H Emokpae, Ejiroghene E, MD   40 mg at 12/28/19 2143   Labs: Basic Metabolic Panel: Recent Labs  Lab 12/27/19 1327 12/28/19 0521 12/29/19 0531  NA 135 134* 132*  K 4.3 4.0 4.5  CL 95* 98 96*  CO2 26 26 23   GLUCOSE 141* 120* 98  BUN 39* 48* 52*  CREATININE 6.00* 6.75* 7.25*  CALCIUM 9.7 8.6* 8.7*   Liver Function Tests: Recent Labs  Lab 12/27/19 1327  AST 24  ALT 18  ALKPHOS 74  BILITOT 0.9  PROT 6.7  ALBUMIN  3.6   No results for input(s): LIPASE, AMYLASE in the last 168 hours. No results for input(s): AMMONIA in the last 168 hours. CBC: Recent Labs  Lab 12/27/19 1327 12/27/19 1327 12/28/19 0111 12/28/19 0111 12/28/19 0521 12/28/19 1308 12/29/19 0531  WBC 7.2   < > 7.0   < > 6.8 7.4 6.8  HGB 9.7*   < > 9.6*   < > 9.2* 10.2* 9.1*  HCT 31.5*   < > 30.1*   < > 28.5* 31.4* 28.8*  MCV 115.4*  --  107.5*  --  105.2* 106.4* 108.7*  PLT 176   < > 115*   < > 127* 141* 127*   < > = values in this interval not displayed.   Cardiac Enzymes: No results for input(s): CKTOTAL, CKMB, CKMBINDEX, TROPONINI in the last 168 hours. CBG: Recent Labs  Lab 12/28/19 1652 12/29/19 0715  GLUCAP 121* 85   Iron Studies: No results for input(s): IRON, TIBC, TRANSFERRIN, FERRITIN in the last 72 hours. Studies/Results: No results found.  ROS: Pertinent items are noted in HPI. Physical Exam: Vitals:   12/29/19 0400 12/29/19 0716 12/29/19 0754 12/29/19 0908  BP: (!) 138/54   133/90  Pulse:  (!) 119    Resp: 12 14  19   Temp:  97.7 F (36.5 C)    TempSrc:  Oral    SpO2:  97% 97%   Weight:      Height:          Weight change:  No intake or output data in the 24 hours ending 12/29/19 0931 BP 133/90   Pulse (!) 119   Temp 97.7 F (36.5 C) (Oral)   Resp 19   Ht 5\' 7"   (1.702 m)   Wt 90.8 kg   SpO2 97%   BMI 31.35 kg/m  General appearance: alert, cooperative and no distress Head: Normocephalic, without obvious abnormality, atraumatic Eyes: negative findings: lids and lashes normal, conjunctivae and sclerae normal and corneas clear Resp: clear to auscultation bilaterally Cardio: tachycardic at 119, no rub GI: soft, non-tender; bowel sounds normal; no masses,  no organomegaly Extremities: extremities normal, atraumatic, no cyanosis or edema and RUE AVF +T/B Dialysis Access:  Dialysis Orders: Center: [facility] on TTS . EDW 87kg HD Bath 2K/2.5Ca  Time 3:15 Heparin 1000 units bolus. Access RUE AVF BFR 400 DFR 600    Hectoral 2 mcg IV/HD Epogen 5000   Units IV/HD  Venofer  100 IV once a week   Assessment/Plan: 1.  Acute GIB- unclear etiology and for colonoscopy today.  S/p blood transfusion with improved BP and Hgb. 2.  ESRD -   Will plan for HD tomorrow to keep on her schedule, no heparin. 3.  Hypertension/volume  - stable for now 4.  Anemia  -  As above 5.  Metabolic bone disease -   Npo for now and follow and resume outpatient meds when able 6.  Nutrition -  Npo for now. 7. Vascular Access- RUE AVF +T/B  Donetta Potts, MD [place] Pager [phone] 12/29/2019, 9:31 AM

## 2019-12-30 LAB — CBC WITH DIFFERENTIAL/PLATELET
Abs Immature Granulocytes: 0.04 10*3/uL (ref 0.00–0.07)
Basophils Absolute: 0 10*3/uL (ref 0.0–0.1)
Basophils Relative: 0 %
Eosinophils Absolute: 0.3 10*3/uL (ref 0.0–0.5)
Eosinophils Relative: 5 %
HCT: 19.4 % — ABNORMAL LOW (ref 36.0–46.0)
Hemoglobin: 6.3 g/dL — CL (ref 12.0–15.0)
Immature Granulocytes: 1 %
Lymphocytes Relative: 18 %
Lymphs Abs: 1.2 10*3/uL (ref 0.7–4.0)
MCH: 35.2 pg — ABNORMAL HIGH (ref 26.0–34.0)
MCHC: 32.5 g/dL (ref 30.0–36.0)
MCV: 108.4 fL — ABNORMAL HIGH (ref 80.0–100.0)
Monocytes Absolute: 0.4 10*3/uL (ref 0.1–1.0)
Monocytes Relative: 6 %
Neutro Abs: 4.6 10*3/uL (ref 1.7–7.7)
Neutrophils Relative %: 70 %
Platelets: 124 10*3/uL — ABNORMAL LOW (ref 150–400)
RBC: 1.79 MIL/uL — ABNORMAL LOW (ref 3.87–5.11)
RDW: 17.7 % — ABNORMAL HIGH (ref 11.5–15.5)
WBC: 6.5 10*3/uL (ref 4.0–10.5)
nRBC: 0 % (ref 0.0–0.2)

## 2019-12-30 LAB — HEMOGLOBIN AND HEMATOCRIT, BLOOD
HCT: 21.8 % — ABNORMAL LOW (ref 36.0–46.0)
HCT: 26.6 % — ABNORMAL LOW (ref 36.0–46.0)
HCT: 24.4 % — ABNORMAL LOW (ref 36.0–46.0)
Hemoglobin: 6.8 g/dL — CL (ref 12.0–15.0)
Hemoglobin: 8.8 g/dL — ABNORMAL LOW (ref 12.0–15.0)
Hemoglobin: 7.9 g/dL — ABNORMAL LOW (ref 12.0–15.0)

## 2019-12-30 LAB — RENAL FUNCTION PANEL
Albumin: 2.5 g/dL — ABNORMAL LOW (ref 3.5–5.0)
Anion gap: 10 (ref 5–15)
BUN: 57 mg/dL — ABNORMAL HIGH (ref 8–23)
CO2: 24 mmol/L (ref 22–32)
Calcium: 8.2 mg/dL — ABNORMAL LOW (ref 8.9–10.3)
Chloride: 97 mmol/L — ABNORMAL LOW (ref 98–111)
Creatinine, Ser: 7.96 mg/dL — ABNORMAL HIGH (ref 0.44–1.00)
GFR, Estimated: 5 mL/min — ABNORMAL LOW (ref >60)
Glucose, Bld: 100 mg/dL — ABNORMAL HIGH (ref 70–99)
Phosphorus: 6.5 mg/dL — ABNORMAL HIGH (ref 2.5–4.6)
Potassium: 4.8 mmol/L (ref 3.5–5.1)
Sodium: 131 mmol/L — ABNORMAL LOW (ref 135–145)

## 2019-12-30 LAB — GLUCOSE, CAPILLARY
Glucose-Capillary: 86 mg/dL (ref 70–99)
Glucose-Capillary: 145 mg/dL — ABNORMAL HIGH (ref 70–99)
Glucose-Capillary: 95 mg/dL (ref 70–99)
Glucose-Capillary: 191 mg/dL — ABNORMAL HIGH (ref 70–99)

## 2019-12-30 LAB — PREPARE RBC (CROSSMATCH)

## 2019-12-30 MED ORDER — PENTAFLUOROPROP-TETRAFLUOROETH EX AERO: 1.0000 "application " | INHALATION_SPRAY | CUTANEOUS | Status: DC | PRN

## 2019-12-30 MED ORDER — LIDOCAINE-PRILOCAINE 2.5-2.5 % EX CREA: 1.0000 "application " | TOPICAL_CREAM | CUTANEOUS | Status: DC | PRN

## 2019-12-30 MED ORDER — SODIUM CHLORIDE 0.9 % IV SOLN: 100.0000 mL | INTRAVENOUS | Status: DC | PRN

## 2019-12-30 MED ORDER — LIDOCAINE HCL (PF) 1 % IJ SOLN: 5.0000 mL | INTRAMUSCULAR | Status: DC | PRN

## 2019-12-30 MED ORDER — IPRATROPIUM-ALBUTEROL 0.5-2.5 (3) MG/3ML IN SOLN
RESPIRATORY_TRACT | Status: AC
  Filled 2019-12-30: qty 3

## 2019-12-30 MED ORDER — DEXTROSE 50 % IV SOLN
INTRAVENOUS | Status: AC
  Filled 2019-12-30: qty 50

## 2019-12-30 MED ORDER — SODIUM CHLORIDE 0.9% IV SOLUTION: Freq: Once | INTRAVENOUS | Status: DC

## 2019-12-30 MED ORDER — MUSCLE RUB 10-15 % EX CREA
TOPICAL_CREAM | CUTANEOUS | Status: DC | PRN
  Filled 2019-12-30: qty 85

## 2019-12-30 NOTE — Progress Notes (Addendum)
CRITICAL VALUE ALERT  Critical Value:  Hemoglobin 6.3  Date & Time Notied:  12/30/19 0715  Provider Notified:J. Roderic Palau MD  Orders Received/Actions taken: penidng

## 2019-12-30 NOTE — TOC Initial Note (Signed)
Transition of Care [facility]) - Initial/Assessment Note    Patient Details  [name] MRN: [id number] Date of Birth: July 29, 1940  Transition of Care [facility]) CM/SW Contact:    Boneta Lucks, RN Phone Number: 12/30/2019, 1:08 PM  Clinical Narrative:    Patient with GI bleed. Has High risk for readmission. Patient getting blood with Dialysis. Lives home alone has CAP aide - M, W, F 9-1. Goes to [facility] [place] in [place], niece or caregiver drives patient. Patient walks with RW. Patient is on home oxygen provided by [employer]. DC plan is to return home.    Expected Discharge Plan: [place] Barriers to Discharge: Continued Medical Work up   Patient Goals and CMS Choice Patient states their goals for this hospitalization and ongoing recovery are:: to go home. CMS Medicare.gov Compare Post Acute Care list provided to:: Patient Choice offered to / list presented to : Patient  Expected Discharge Plan and Services Expected Discharge Plan: [place]    Living arrangements for the past 2 months: Single Family Home        Prior Living Arrangements/Services Living arrangements for the past 2 months: Single Family Home Lives with:: Self          Need for Family Participation in Patient Care: Yes (Comment) Care giver support system in place?: Yes (comment)   Criminal Activity/Legal Involvement Pertinent to Current Situation/Hospitalization: No - Comment as needed  Activities of Daily Living Home Assistive Devices/Equipment: Dentures (specify type), Eyeglasses, Cane (specify quad or straight), Walker (specify type), Bedside commode/3-in-1 ADL Screening (condition at time of admission) Patient's cognitive ability adequate to safely complete daily activities?: Yes Is the patient deaf or have difficulty hearing?: No Does the patient have difficulty seeing, even when wearing glasses/contacts?: No Does the patient have difficulty concentrating, remembering, or  making decisions?: No Patient able to express need for assistance with ADLs?: Yes Does the patient have difficulty dressing or bathing?: No Independently performs ADLs?: Yes (appropriate for developmental age) Does the patient have difficulty walking or climbing stairs?: Yes Weakness of Legs: Both Weakness of Arms/Hands: None  Permission Sought/Granted     Emotional Assessment     Affect (typically observed): Accepting, Pleasant Orientation: : Oriented to Self, Oriented to Place, Oriented to  Time, Oriented to Situation Alcohol / Substance Use: Not Applicable Psych Involvement: No (comment)  Admission diagnosis:  Rectal bleeding [K62.5] Acute GI bleeding [K92.2] ESRD on dialysis ([place]) [N18.6, Z99.2] Patient Active Problem List   Diagnosis Date Noted  . Rectal bleeding 12/27/2019  . Acute GI bleeding 12/27/2019  . Nonsustained ventricular tachycardia ([address]) 10/27/2019  . Acute respiratory failure with hypoxia ([place]) 10/26/2019  . Megaloblastic anemia 10/26/2019  . Chronic blood loss anemia 10/26/2017  . Arthritis 10/26/2017  . Acute on chronic blood loss anemia 10/26/2017  . Iron deficiency anemia 02/23/2017  . Chronic diastolic CHF (congestive heart failure) ([place]) 10/15/2016  . Elevated troponin 10/15/2016  . Type 2 diabetes mellitus with nephropathy ([place]) 10/15/2016  . ESRD on dialysis ([place]) 10/15/2016  . NSTEMI (non-ST elevated myocardial infarction) ([place]) 10/03/2015  . Elevated LFTs 10/03/2015  . Encounter for therapeutic drug monitoring 07/29/2015  . COPD (chronic obstructive pulmonary disease) ([place]) 06/05/2015  . CKD stage 4 due to type 2 diabetes mellitus ([place]) 06/05/2015  . Anemia in chronic kidney disease, Stage IV 06/05/2015  . Acute on chronic diastolic CHF (congestive heart failure) ([place]) 06/05/2015  . Atrial fibrillation ([place]) 06/05/2015  . Atrial fibrillation with rapid  ventricular response ([place]) 12/07/2014  . TIA (transient ischemic attack) 09/02/2014  .  Gait difficulty 09/02/2014  . Other specified transient cerebral ischemias   . Stroke with cerebral ischemia ([place])   . Hemispheric carotid artery syndrome   . Essential hypertension   . Hyperlipidemia   . Type 2 diabetes mellitus with other circulatory complications ([place])   . Cerebral infarction ([place]) 05/27/2014   PCP:  Monico Blitz, MD Pharmacy:   [facility], [address] [place] [address] Phone: [phone] Fax: [phone]   Readmission Risk Interventions Readmission Risk Prevention Plan 12/30/2019 10/27/2019  Transportation Screening Complete Complete  PCP or Specialist Appt within 5-7 Days - Complete  Home Care Screening - Complete  Medication Review (RN CM) - Complete  HRI or Home Care Consult Complete -  Social Work Consult for Recovery Care Planning/Counseling Complete -  Palliative Care Screening Not Complete -  Medication Review (RN Care Manager) Complete -  Some recent data might be hidden

## 2019-12-30 NOTE — Progress Notes (Signed)
Patient ID: Jody [name], female   DOB: 04-Dec-1940, 79 y.o.   MRN: [id number] S: Feels better today.  Colonoscopy revealed actively bleeding diverticulum s/p 2 clips.  O:BP 101/60   Pulse (!) 55   Temp 98.5 F (36.9 C) (Oral)   Resp 19   Ht 5\' 7"  (1.702 m)   Wt 89.2 kg   SpO2 (!) 88%   BMI 30.80 kg/m   Intake/Output Summary (Last 24 hours) at 12/30/2019 0911 Last data filed at 12/29/2019 1707 Gross per 24 hour  Intake 600 ml  Output 0 ml  Net 600 ml   Intake/Output: I/O last 3 completed shifts: In: 600 [P.O.:200; I.V.:400] Out: 0   Intake/Output this shift:  No intake/output data recorded. Weight change:  Gen: NAD CVS: bradycardic at 55, no rub Resp: cta Abd: +BS, soft, Nt/ND Ext: no edema, RUE AVG +T/B  Recent Labs  Lab 12/27/19 1327 12/28/19 0521 12/29/19 0531 12/30/19 0500  NA 135 134* 132* 131*  K 4.3 4.0 4.5 4.8  CL 95* 98 96* 97*  CO2 26 26 23 24   GLUCOSE 141* 120* 98 100*  BUN 39* 48* 52* 57*  CREATININE 6.00* 6.75* 7.25* 7.96*  ALBUMIN 3.6  --   --  2.5*  CALCIUM 9.7 8.6* 8.7* 8.2*  PHOS  --   --   --  6.5*  AST 24  --   --   --   ALT 18  --   --   --    Liver Function Tests: Recent Labs  Lab 12/27/19 1327 12/30/19 0500  AST 24  --   ALT 18  --   ALKPHOS 74  --   BILITOT 0.9  --   PROT 6.7  --   ALBUMIN 3.6 2.5*   No results for input(s): LIPASE, AMYLASE in the last 168 hours. No results for input(s): AMMONIA in the last 168 hours. CBC: Recent Labs  Lab 12/28/19 0111 12/28/19 0111 12/28/19 0521 12/28/19 0521 12/28/19 1308 12/28/19 1308 12/29/19 0531 12/29/19 1757 12/30/19 0500  WBC 7.0   < > 6.8   < > 7.4  --  6.8  --  6.5  NEUTROABS  --   --   --   --   --   --   --   --  4.6  HGB 9.6*   < > 9.2*   < > 10.2*   < > 9.1* 8.9* 6.3*  HCT 30.1*   < > 28.5*   < > 31.4*   < > 28.8* 31.2* 19.4*  MCV 107.5*  --  105.2*  --  106.4*  --  108.7*  --  108.4*  PLT 115*   < > 127*   < > 141*  --  127*  --  124*   < > = values in this  interval not displayed.   Cardiac Enzymes: No results for input(s): CKTOTAL, CKMB, CKMBINDEX, TROPONINI in the last 168 hours. CBG: Recent Labs  Lab 12/29/19 1425 12/29/19 1505 12/29/19 1654 12/29/19 2112 12/30/19 0718  GLUCAP 53* 115* 71 98 86    Iron Studies: No results for input(s): IRON, TIBC, TRANSFERRIN, FERRITIN in the last 72 hours. Studies/Results: No results found. . sodium chloride   Intravenous Once  . sodium chloride   Intravenous Once  . sodium chloride   Intravenous Once  . Chlorhexidine Gluconate Cloth  6 each Topical Daily  . Chlorhexidine Gluconate Cloth  6 each Topical Q0600  . insulin aspart  0-9 Units  Subcutaneous TID WC  . mometasone-formoterol  2 puff Inhalation BID  . pantoprazole (PROTONIX) IV  40 mg Intravenous Q24H    BMET    Component Value Date/Time   NA 131 (L) 12/30/2019 0500   K 4.8 12/30/2019 0500   CL 97 (L) 12/30/2019 0500   CO2 24 12/30/2019 0500   GLUCOSE 100 (H) 12/30/2019 0500   BUN 57 (H) 12/30/2019 0500   CREATININE 7.96 (H) 12/30/2019 0500   CALCIUM 8.2 (L) 12/30/2019 0500   GFRNONAA 5 (L) 12/30/2019 0500   GFRAA 9 (L) 10/27/2019 0112   CBC    Component Value Date/Time   WBC 6.5 12/30/2019 0500   RBC 1.79 (L) 12/30/2019 0500   HGB 6.3 (LL) 12/30/2019 0500   HCT 19.4 (L) 12/30/2019 0500   PLT 124 (L) 12/30/2019 0500   MCV 108.4 (H) 12/30/2019 0500   MCH 35.2 (H) 12/30/2019 0500   MCHC 32.5 12/30/2019 0500   RDW 17.7 (H) 12/30/2019 0500   LYMPHSABS 1.2 12/30/2019 0500   MONOABS 0.4 12/30/2019 0500   EOSABS 0.3 12/30/2019 0500   BASOSABS 0.0 12/30/2019 0500    Dialysis Orders: Center: [facility] on TTS . EDW 87kg HD Bath 2K/2.5Ca  Time 3:15 Heparin 1000 units bolus. Access RUE AVF BFR 400 DFR 600    Hectoral 2 mcg IV/HD Epogen 5000   Units IV/HD  Venofer  100 IV once a week   Assessment/Plan: 1.  Acute GIB- presumably due to diverticulosis s/p clipping of an actively bleeding diverticulum.  Hgb dropped again to  6.3 today.  Will need blood transfusion with HD today.  Continue to follow H/H.  Appreciate GI assistance.  2.  ESRD -   Will plan for HD today to keep on outpatient schedule.  Transfuse 2 units of PRBC's with HD.  3.  Hypertension/volume  - stable for now 4.  Anemia  -  As above 5.  Metabolic bone disease -  resume outpatient meds and follow.  6.  Nutrition -  renal diet 7. Vascular Access- RUE AVF +T/B  Donetta Potts, MD [facility] [phone]

## 2019-12-30 NOTE — Procedures (Signed)
   HEMODIALYSIS TREATMENT NOTE:   3 hour heparin-free HD completed via RUE AVF (15g/antegrade).  Goal met: 500cc removed.  2 units pRBC transfused without problems.  All blood was returned and hemostasis was achieved in 15 minutes.  No changes from pre-dialysis assessment.  [place], RN

## 2019-12-30 NOTE — Progress Notes (Signed)
PROGRESS NOTE    Jody [name]  OVZ:[id number] DOB: September 14, 1940 DOA: 12/27/2019 PCP: Monico Blitz, MD    Brief Narrative:  79 year old female with a history of end-stage renal disease on hemodialysis, atrial fibrillation, diabetes, diastolic heart failure, is brought to the emergency room after having several episodes of bright red blood per rectum.  Symptoms started during dialysis.  She was noted to be hypotensive on arrival.  She was transfused 2 units of PRBC and she was found to have a hemoglobin of 7.2.  Patient was admitted for further GI evaluation.   Assessment & Plan:   Principal Problem:   Rectal bleeding Active Problems:   Stroke with cerebral ischemia [facility])   Essential hypertension   Type 2 diabetes mellitus with other circulatory complications (HCC)   TIA (transient ischemic attack)   COPD (chronic obstructive pulmonary disease) (HCC)   Atrial fibrillation (HCC)   Chronic diastolic CHF (congestive heart failure) (HCC)   Type 2 diabetes mellitus with nephropathy (HCC)   ESRD on dialysis ([place])   Acute GI bleeding   Acute GI bleeding -Etiology is unclear, but suspect lower GI bleeding -Gastroenterology following -Patient was noted to be hypotensive on arrival, blood pressure has improved with IV fluids and PRBC transfusion -Colonoscopy on 11/15 showed active bleeding from diverticuli which was clipped -She is has continued to have bloody stools overnight, but not as pronounced as before -Suspect this is routine blood, consider passing -If she has evidence of continued, active GI bleeding, would need CTA abdomen versus nuclear medicine bleeding scan  Acute blood loss anemia -Patient was noted to be hypotensive and had been having frequent bloody stools -She was transfused 2 units PRBC on 11/13 -Follow-up hemoglobin remained in the 9-10 range -Today she was noted to have hemoglobin of 6.3 -Considering the amount of blood that was noted in her colon during  colonoscopy, it was expected that she would have it a drop in her hemoglobin of approximately 1 to 2g -Another 2 units of PRBCs have been ordered -Follow-up hemoglobin  End-stage renal disease on hemodialysis -Tuesday, Thursday, Saturday -She had partial treatment on 11/13 -Nephrology consulted -Patient underwent dialysis treatment on 11/16  Diabetes, insulin-dependent -Blood sugars currently stable -Continue on sliding scale insulin  Atrial fibrillation, chronic -She is on rate control with diltiazem metoprolol, although these were held due to hypotension on arrival -Not a candidate for anticoagulation due to recurrent bleeding  Chronic diastolic congestive heart failure. -Appears compensated -Volume status managed with dialysis  COPD -No wheezing or shortness of breath time. -Continue bronchodilators as needed   DVT prophylaxis: SCDs Start: 12/27/19 1949  Code Status: Full code Family Communication: Updated niece [name], [place] over the phone 11/16 Disposition Plan: Status is: Inpatient  Remains inpatient appropriate because:Inpatient level of care appropriate due to severity of illness   Dispo: The patient is from: Home              Anticipated d/c is to: Home              Anticipated d/c date is: 1 day              Patient currently is not medically stable to d/c.    Consultants:   Gastroenterology  Nephrology  Procedures:  Colonoscopy: Preparation of the colon was fair.                           - Blood in the entire examined  colon.                           - Diverticulosis in the entire examined colon.                           - Diverticulosis in the ascending colon. There was                            active bleeding coming from the diverticular                            opening. Clips (MR conditional) were placed.                            - No specimens collected  Antimicrobials:       Subjective: Continues to have some blood in her  stools overnight, although it was not as pronounced as previous nights  Objective: Vitals:   12/30/19 1515 12/30/19 1530 12/30/19 1545 12/30/19 1616  BP: 125/76 117/70 106/68   Pulse: 87 90 92   Resp: 20 20 18 15   Temp:   98 F (36.7 C) 98.5 F (36.9 C)  TempSrc:   Oral Oral  SpO2:      Weight:      Height:        Intake/Output Summary (Last 24 hours) at 12/30/2019 1730 Last data filed at 12/30/2019 1530 Gross per 24 hour  Intake 630 ml  Output 500 ml  Net 130 ml   Filed Weights   12/28/19 0027 12/30/19 0500 12/30/19 1220  Weight: 90.8 kg 89.2 kg 89.2 kg    Examination:  General exam: Alert, awake, oriented x 3 Respiratory system: Clear to auscultation. Respiratory effort normal. Cardiovascular system:RRR. No murmurs, rubs, gallops. Gastrointestinal system: Abdomen is nondistended, soft and nontender. No organomegaly or masses felt. Normal bowel sounds heard. Central nervous system: Alert and oriented. No focal neurological deficits. Extremities: No C/C/E, +pedal pulses Skin: No rashes, lesions or ulcers Psychiatry: Judgement and insight appear normal. Mood & affect appropriate.    Data Reviewed: I have personally reviewed following labs and imaging studies  CBC: Recent Labs  Lab 12/28/19 0111 12/28/19 0111 12/28/19 0521 12/28/19 0521 12/28/19 1308 12/29/19 0531 12/29/19 1757 12/30/19 0500 12/30/19 0851  WBC 7.0  --  6.8  --  7.4 6.8  --  6.5  --   NEUTROABS  --   --   --   --   --   --   --  4.6  --   HGB 9.6*   < > 9.2*   < > 10.2* 9.1* 8.9* 6.3* 6.8*  HCT 30.1*   < > 28.5*   < > 31.4* 28.8* 31.2* 19.4* 21.8*  MCV 107.5*  --  105.2*  --  106.4* 108.7*  --  108.4*  --   PLT 115*  --  127*  --  141* 127*  --  124*  --    < > = values in this interval not displayed.   Basic Metabolic Panel: Recent Labs  Lab 12/27/19 1327 12/28/19 0521 12/29/19 0531 12/30/19 0500  NA 135 134* 132* 131*  K 4.3 4.0 4.5 4.8  CL 95* 98 96* 97*  CO2 26 26 23 24     GLUCOSE 141*  120* 98 100*  BUN 39* 48* 52* 57*  CREATININE 6.00* 6.75* 7.25* 7.96*  CALCIUM 9.7 8.6* 8.7* 8.2*  MG 2.6*  --   --   --   PHOS  --   --   --  6.5*   GFR: Estimated Creatinine Clearance: 6.4 mL/min (A) (by C-G formula based on SCr of 7.96 mg/dL (H)). Liver Function Tests: Recent Labs  Lab 12/27/19 1327 12/30/19 0500  AST 24  --   ALT 18  --   ALKPHOS 74  --   BILITOT 0.9  --   PROT 6.7  --   ALBUMIN 3.6 2.5*   No results for input(s): LIPASE, AMYLASE in the last 168 hours. No results for input(s): AMMONIA in the last 168 hours. Coagulation Profile: Recent Labs  Lab 12/27/19 1327  INR 1.0   Cardiac Enzymes: No results for input(s): CKTOTAL, CKMB, CKMBINDEX, TROPONINI in the last 168 hours. BNP (last 3 results) No results for input(s): PROBNP in the last 8760 hours. HbA1C: Recent Labs    12/28/19 0111  HGBA1C 5.1   CBG: Recent Labs  Lab 12/29/19 1654 12/29/19 2112 12/30/19 0718 12/30/19 1122 12/30/19 1615  GLUCAP 71 98 86 145* 95   Lipid Profile: No results for input(s): CHOL, HDL, LDLCALC, TRIG, CHOLHDL, LDLDIRECT in the last 72 hours. Thyroid Function Tests: No results for input(s): TSH, T4TOTAL, FREET4, T3FREE, THYROIDAB in the last 72 hours. Anemia Panel: No results for input(s): VITAMINB12, FOLATE, FERRITIN, TIBC, IRON, RETICCTPCT in the last 72 hours. Sepsis Labs: No results for input(s): PROCALCITON, LATICACIDVEN in the last 168 hours.  Recent Results (from the past 240 hour(s))  Respiratory Panel by RT PCR (Flu A&B, Covid) - Nasopharyngeal Swab     Status: None   Collection Time: 12/27/19  3:58 PM   Specimen: Nasopharyngeal Swab  Result Value Ref Range Status   SARS Coronavirus 2 by RT PCR NEGATIVE NEGATIVE Final    Comment: (NOTE) SARS-CoV-2 target nucleic acids are NOT DETECTED.  The SARS-CoV-2 RNA is generally detectable in upper respiratoy specimens during the acute phase of infection. The lowest concentration of SARS-CoV-2  viral copies this assay can detect is 131 copies/mL. A negative result does not preclude SARS-Cov-2 infection and should not be used as the sole basis for treatment or other patient management decisions. A negative result may occur with  improper specimen collection/handling, submission of specimen other than nasopharyngeal swab, presence of viral mutation(s) within the areas targeted by this assay, and inadequate number of viral copies (<131 copies/mL). A negative result must be combined with clinical observations, patient history, and epidemiological information. The expected result is Negative.  Fact Sheet for Patients:  PinkCheek.be  Fact Sheet for Healthcare Providers:  GravelBags.it  This test is no t yet approved or cleared by the [place] FDA and  has been authorized for detection and/or diagnosis of SARS-CoV-2 by FDA under an Emergency Use Authorization (EUA). This EUA will remain  in effect (meaning this test can be used) for the duration of the COVID-19 declaration under Section 564(b)(1) of the Act, 21 U.S.C. section 360bbb-3(b)(1), unless the authorization is terminated or revoked sooner.     Influenza A by PCR NEGATIVE NEGATIVE Final   Influenza B by PCR NEGATIVE NEGATIVE Final    Comment: (NOTE) The Xpert Xpress SARS-CoV-2/FLU/RSV assay is intended as an aid in  the diagnosis of influenza from Nasopharyngeal swab specimens and  should not be used as a sole basis for treatment. Nasal washings  and  aspirates are unacceptable for Xpert Xpress SARS-CoV-2/FLU/RSV  testing.  Fact Sheet for Patients: PinkCheek.be  Fact Sheet for Healthcare Providers: GravelBags.it  This test is not yet approved or cleared by the [place] FDA and  has been authorized for detection and/or diagnosis of SARS-CoV-2 by  FDA under an Emergency Use Authorization (EUA).  This EUA will remain  in effect (meaning this test can be used) for the duration of the  Covid-19 declaration under Section 564(b)(1) of the Act, 21  U.S.C. section 360bbb-3(b)(1), unless the authorization is  terminated or revoked. Performed at [facility], [address], [place], [place] [address]   MRSA PCR Screening     Status: None   Collection Time: 12/28/19 12:19 AM   Specimen: Nasopharyngeal  Result Value Ref Range Status   MRSA by PCR NEGATIVE NEGATIVE Final    Comment:        The GeneXpert MRSA Assay (FDA approved for NASAL specimens only), is one component of a comprehensive MRSA colonization surveillance program. It is not intended to diagnose MRSA infection nor to guide or monitor treatment for MRSA infections. Performed at [facility], [address], [place], [place] [address]          Radiology Studies: No results found.      Scheduled Meds: . sodium chloride   Intravenous Once  . sodium chloride   Intravenous Once  . sodium chloride   Intravenous Once  . Chlorhexidine Gluconate Cloth  6 each Topical Daily  . Chlorhexidine Gluconate Cloth  6 each Topical Q0600  . insulin aspart  0-9 Units Subcutaneous TID WC  . mometasone-formoterol  2 puff Inhalation BID  . pantoprazole (PROTONIX) IV  40 mg Intravenous Q24H   Continuous Infusions: . sodium chloride    . sodium chloride       LOS: 3 days    Time spent: 13mins    Kathie Dike, MD [facility]   If 7PM-7AM, please contact night-coverage [email or web address]  12/30/2019, 5:30 PM

## 2019-12-30 NOTE — Progress Notes (Signed)
CRITICAL VALUE ALERT  Critical Value: hemoglobin 6.8  Date & Time Notied:  12/30/19  Provider Notified: Pearletha Forge MD  Orders Received/Actions taken: NNO at this time

## 2019-12-30 NOTE — Progress Notes (Signed)
Subjective: Patient reports having a small amount of bright red blood on her sheets when she was changed this morning.  Denies abdominal pain, nausea, vomiting.  Asking for something to eat.  Spoke with nursing staff this morning.  Reports 1 large red bowel movement around 6 PM yesterday.  States overnight nurse reported much less rectal bleeding than she had been having.  Unable to quantify the amount, tell me the number of occurrences, or time of last occurrence of rectal bleeding.  There is no overnight documentation.  Hemoglobin declined to 6.3 this morning from 8.9 yesterday.   Objective: Vital signs in last 24 hours: Temp:  [97.5 F (36.4 C)-98.5 F (36.9 C)] 98.5 F (36.9 C) (11/16 0716) Pulse Rate:  [55-92] 55 (11/16 0716) Resp:  [12-28] 19 (11/16 0716) BP: (96-163)/(43-91) 101/60 (11/16 0400) SpO2:  [88 %-99 %] 88 % (11/16 0716) Weight:  [89.2 kg] 89.2 kg (11/16 0500) Last BM Date: 12/29/19 General:   Alert and oriented, pleasant Head:  Normocephalic and atraumatic. Eyes:  No icterus, sclera clear. Conjuctiva pink.  Abdomen:  Bowel sounds present, soft, non-tender, non-distended. No HSM or hernias noted. No rebound or guarding. No masses appreciated  Extremities:  Without edema. Neurologic:  Alert and  oriented x4;  grossly normal neurologically. Psych:  Normal mood and affect.  Intake/Output from previous day: 11/15 0701 - 11/16 0700 In: 600 [P.O.:200; I.V.:400] Out: 0  Intake/Output this shift: No intake/output data recorded.  Lab Results: Recent Labs    12/28/19 1308 12/28/19 1308 12/29/19 0531 12/29/19 1757 12/30/19 0500  WBC 7.4  --  6.8  --  6.5  HGB 10.2*   < > 9.1* 8.9* 6.3*  HCT 31.4*   < > 28.8* 31.2* 19.4*  PLT 141*  --  127*  --  124*   < > = values in this interval not displayed.   BMET Recent Labs    12/28/19 0521 12/29/19 0531 12/30/19 0500  NA 134* 132* 131*  K 4.0 4.5 4.8  CL 98 96* 97*  CO2 26 23 24   GLUCOSE 120* 98 100*  BUN 48*  52* 57*  CREATININE 6.75* 7.25* 7.96*  CALCIUM 8.6* 8.7* 8.2*   LFT Recent Labs    12/27/19 1327 12/30/19 0500  PROT 6.7  --   ALBUMIN 3.6 2.5*  AST 24  --   ALT 18  --   ALKPHOS 74  --   BILITOT 0.9  --    PT/INR Recent Labs    12/27/19 1327  LABPROT 12.8  INR 1.0   Assessment: Very pleasant 79 year old female presenting with acute lower GI bleeding s/p 2 units PRBCs on 11/13 and colonoscopy 11/15 with red blood throughout the entire colon, pancolonic diverticulosis, bleeding diverticula in the ascending colon s/p placement of MR conditional clips x2.  She had a decline in hemoglobin this morning to 6.3 from 8.9 yesterday evening.  Per nursing staff and patient, she did have some rectal bleeding overnight, but volume was much less than previously.   Discussed case with Dr. Abbey Chatters who performed colonoscopy yesterday.  Stated he expected a 1-2 g drop in hemoglobin due to the amount of bleeding present during procedure yesterday.  She has not had any overt bleeding since shift change this morning, and it is difficult to determine timing of rectal bleeding overnight as it was just on her sheets, will hold off on CTA for now.  Agree with rechecking H&H and transfusing PRBCs which has been ordered  by hospitalist.  If she continues to have further rectal bleeding, will need CTA or tagged RBC scan STAT.   Notably, patient has been on Eliquis chronically due to history of A. Fib although she has been off of this for about 1 month prior to her hospitalization. May need discussion with cardiology regarding risks vs benefits of resuming Eliquis at discharge.    Plan: Repeat H/H and continue to monitor this.  Transfuse 2 units PRBCs. Monitor for ongoing overt GI bleeding. Continue Protonix 40 mg daily. If she rebleeds, will need stat CTA or tagged RBC scan STAT. I have asked nursing staff to monitor this closely and let me know of any recurrent rectal bleeding.   LOS: 3 days    12/30/2019,  7:46 AM   Aliene Altes, PA-C [facility]

## 2019-12-31 DIAGNOSIS — K922 Gastrointestinal hemorrhage, unspecified: Secondary | ICD-10-CM

## 2019-12-31 DIAGNOSIS — K625 Hemorrhage of anus and rectum: Secondary | ICD-10-CM

## 2019-12-31 LAB — RENAL FUNCTION PANEL
Albumin: 2.8 g/dL — ABNORMAL LOW (ref 3.5–5.0)
Anion gap: 8 (ref 5–15)
BUN: 27 mg/dL — ABNORMAL HIGH (ref 8–23)
CO2: 29 mmol/L (ref 22–32)
Calcium: 8.1 mg/dL — ABNORMAL LOW (ref 8.9–10.3)
Chloride: 94 mmol/L — ABNORMAL LOW (ref 98–111)
Creatinine, Ser: 4.55 mg/dL — ABNORMAL HIGH (ref 0.44–1.00)
GFR, Estimated: 9 mL/min — ABNORMAL LOW (ref >60)
Glucose, Bld: 115 mg/dL — ABNORMAL HIGH (ref 70–99)
Phosphorus: 4.5 mg/dL (ref 2.5–4.6)
Potassium: 3.6 mmol/L (ref 3.5–5.1)
Sodium: 131 mmol/L — ABNORMAL LOW (ref 135–145)

## 2019-12-31 LAB — TYPE AND SCREEN
ABO/RH(D): O POS
Antibody Screen: NEGATIVE
Unit division: 0
Unit division: 0

## 2019-12-31 LAB — BPAM RBC
Blood Product Expiration Date: 202112192359
Blood Product Expiration Date: 202112232359
ISSUE DATE / TIME: 202111161158
ISSUE DATE / TIME: 202111161335
Unit Type and Rh: 5100
Unit Type and Rh: 5100

## 2019-12-31 LAB — GLUCOSE, CAPILLARY
Glucose-Capillary: 119 mg/dL — ABNORMAL HIGH (ref 70–99)
Glucose-Capillary: 159 mg/dL — ABNORMAL HIGH (ref 70–99)

## 2019-12-31 LAB — CBC
HCT: 25.3 % — ABNORMAL LOW (ref 36.0–46.0)
Hemoglobin: 8.4 g/dL — ABNORMAL LOW (ref 12.0–15.0)
MCH: 33.9 pg (ref 26.0–34.0)
MCHC: 33.2 g/dL (ref 30.0–36.0)
MCV: 102 fL — ABNORMAL HIGH (ref 80.0–100.0)
Platelets: 107 10*3/uL — ABNORMAL LOW (ref 150–400)
RBC: 2.48 MIL/uL — ABNORMAL LOW (ref 3.87–5.11)
RDW: 18.6 % — ABNORMAL HIGH (ref 11.5–15.5)
WBC: 6.3 10*3/uL (ref 4.0–10.5)
nRBC: 0 % (ref 0.0–0.2)

## 2019-12-31 LAB — HEMOGLOBIN AND HEMATOCRIT, BLOOD
HCT: 26.3 % — ABNORMAL LOW (ref 36.0–46.0)
Hemoglobin: 8.6 g/dL — ABNORMAL LOW (ref 12.0–15.0)

## 2019-12-31 MED ORDER — PANTOPRAZOLE SODIUM 40 MG PO TBEC: 40.0000 mg | DELAYED_RELEASE_TABLET | Freq: Every day | ORAL | 1 refills | Status: DC

## 2019-12-31 MED ORDER — APIXABAN 2.5 MG PO TABS
2.5000 mg | ORAL_TABLET | Freq: Two times a day (BID) | ORAL | 11 refills | Status: DC
Start: 2020-01-01 — End: 2020-07-07

## 2019-12-31 NOTE — Progress Notes (Signed)
GI Inpatient Follow-up Note  Subjective: denies any abdominal pain or bleeding over night. States she feels well. Ate a good breakfast. No concerns this AM  Scheduled Inpatient Medications:  . sodium chloride   Intravenous Once  . sodium chloride   Intravenous Once  . sodium chloride   Intravenous Once  . Chlorhexidine Gluconate Cloth  6 each Topical Daily  . Chlorhexidine Gluconate Cloth  6 each Topical Q0600  . insulin aspart  0-9 Units Subcutaneous TID WC  . mometasone-formoterol  2 puff Inhalation BID  . pantoprazole (PROTONIX) IV  40 mg Intravenous Q24H    Continuous Inpatient Infusions:   . sodium chloride    . sodium chloride      PRN Inpatient Medications:  sodium chloride, sodium chloride, acetaminophen, lidocaine (PF), lidocaine-prilocaine, Muscle Rub, ondansetron **OR** ondansetron (ZOFRAN) IV, pentafluoroprop-tetrafluoroeth  Review of Systems: Constitutional: Weight is stable.  Eyes: No changes in vision. ENT: No oral lesions, sore throat.  GI: see HPI.  Heme/Lymph: No easy bruising.  CV: No chest pain.  GU: No hematuria.  Integumentary: No rashes.  Neuro: No headaches.  Psych: No depression/anxiety.  Endocrine: No heat/cold intolerance.  Allergic/Immunologic: No urticaria.  Resp: No cough, SOB.  Musculoskeletal: No joint swelling.    Physical Examination: BP (!) 141/52   Pulse 92   Temp 98.1 F (36.7 C) (Oral)   Resp 11   Ht 5\' 7"  (1.702 m)   Wt 89.2 kg   SpO2 98%   BMI 30.80 kg/m  Gen: NAD, alert and oriented x 4 HEENT: PEERLA, EOMI, Neck: supple, no JVD or thyromegaly Chest: CTA bilaterally, no wheezes, crackles, or other adventitious sounds CV: RRR, no m/g/c/r Abd: soft, NT, ND, +BS in all four quadrants; no HSM, guarding, ridigity, or rebound tenderness Ext: no edema, well perfused with 2+ pulses, Skin: no rash or lesions noted Lymph: no LAD  Data: Lab Results  Component Value Date   WBC 6.3 12/31/2019   HGB 8.4 (L) 12/31/2019    HCT 25.3 (L) 12/31/2019   MCV 102.0 (H) 12/31/2019   PLT 107 (L) 12/31/2019   Recent Labs  Lab 12/30/19 1753 12/30/19 2203 12/31/19 0420  HGB 8.8* 7.9* 8.4*   Lab Results  Component Value Date   NA 131 (L) 12/31/2019   K 3.6 12/31/2019   CL 94 (L) 12/31/2019   CO2 29 12/31/2019   BUN 27 (H) 12/31/2019   CREATININE 4.55 (H) 12/31/2019   Lab Results  Component Value Date   ALT 18 12/27/2019   AST 24 12/27/2019   ALKPHOS 74 12/27/2019   BILITOT 0.9 12/27/2019   Recent Labs  Lab 12/27/19 1327  INR 1.0   Assessment/Plan: Jody [name] is a 79 y.o. female with PMHx ESRD on HD, Atrial fib, COPD, CHF, admitted for acute lower GI bleed s/p 2 units PRBCS on 11/13 and colonoscopy 11/15 with red blood throughout the entire colon, pancolonic diverticulosis, bleeding diverticula in the ascending colon s/p placement of MR conditional clips x2. Had rectal bleeding with drop of Hgb from 8.9--6.3 felt related to remnant blood and equilibrating after colonoscopy.   1. Lower GI bleed - Received 2 units yesterday and Hgb stable this morning at 8.4. Planning for additional unit w/ HD today. If clinically develops large volume bleeding will need stat CTA or tagged RBC scan but fortunately bleeding seems resolved at this point. Hx of Eliquis in past but has been off over past month. Tolerating a soft diet. Planning for d/c today.  Can monitor H/H as outpatient at next dialysis session.   Case discussed w/ Dr Laural Golden.  Please call with questions or concerns.    Ronney Asters, PA-C [facility]

## 2019-12-31 NOTE — Care Management Important Message (Signed)
Important Message  Patient Details  [name] MRN: [id number] Date of Birth: [birth year]   Medicare Important Message Given:  Yes     [name] 12/31/2019, 12:07 PM

## 2019-12-31 NOTE — Discharge Summary (Signed)
Physician Discharge Summary  Marki Frede DXA:[id number] DOB: September 07, 1940 DOA: 12/27/2019  PCP: Monico Blitz, MD  Admit date: 12/27/2019  Discharge date: 12/31/2019  Admitted From:Home  Disposition:  Home  Recommendations for Outpatient Follow-up:  1. Follow up with PCP in 1-2 weeks and recheck CBC to ensure stability 2. Follow-up with GI outpatient as recommended with information provided 3. Information on low residue diet to be provided 4. Continue Protonix daily for now 5. Plan to resume Eliquis on 11/18 and continue other home medications as prior  Home Health: None  Equipment/Devices: None  Discharge Condition: Stable  CODE STATUS: Full  Diet recommendation: Renal diet/carb modified-low residue  Brief/Interim Summary: 79 year old female with a history of end-stage renal disease on hemodialysis, atrial fibrillation, diabetes, diastolic heart failure, is brought to the emergency room after having several episodes of bright red blood per rectum.  Symptoms started during dialysis.  She was noted to be hypotensive on arrival.  She was transfused 2 units of PRBC and she was found to have a hemoglobin of 7.2.  Patient was admitted for further GI evaluation.  -Patient underwent colonoscopy on 11/15 which demonstrated active bleeding from diverticuli with 2 clips placed. She continued to have some bloody stools overnight and did receive 2U PRBCs as well as hemodialysis on 11/16. She remained stable this morning with no further overt bleeding noted. Her hemoglobin levels remained stable at 8.4 this morning and she is overall stable for discharge to resume Eliquis on 11/18 as well as her dialysis on 11/18. No other acute events have been noted throughout the course of this admission and she is overall stable for discharge today with plans as noted above.  Discharge Diagnoses:  Principal Problem:   Rectal bleeding Active Problems:   Stroke with cerebral ischemia [facility])   Essential  hypertension   Type 2 diabetes mellitus with other circulatory complications (HCC)   TIA (transient ischemic attack)   COPD (chronic obstructive pulmonary disease) (HCC)   Atrial fibrillation (HCC)   Chronic diastolic CHF (congestive heart failure) (HCC)   Type 2 diabetes mellitus with nephropathy ([place])   ESRD on dialysis [facility])   Acute GI bleeding    Discharge Instructions  Discharge Instructions    Diet - low sodium heart healthy   Complete by: As directed    Increase activity slowly   Complete by: As directed      Allergies as of 12/31/2019      Reactions   Codeine Nausea And Vomiting      Medication List    TAKE these medications   albuterol 108 (90 Base) MCG/ACT inhaler Commonly known as: VENTOLIN HFA Inhale 1-2 puffs into the lungs every 6 (six) hours as needed for wheezing or shortness of breath. Reported on 03/01/2015   albuterol 0.63 MG/3ML nebulizer solution Commonly known as: ACCUNEB Take 1 ampule by nebulization every 6 (six) hours as needed for wheezing.   apixaban 2.5 MG Tabs tablet Commonly known as: Eliquis Take 1 tablet (2.5 mg total) by mouth 2 (two) times daily. Start taking on: January 01, 2020   atorvastatin 40 MG tablet Commonly known as: LIPITOR Take 1 tablet by mouth at bedtime.   diltiazem 180 MG 24 hr capsule Commonly known as: DILACOR XR Take 180 mg by mouth daily.   fluticasone-salmeterol 230-21 MCG/ACT inhaler Commonly known as: ADVAIR HFA Inhale 2 puffs into the lungs 2 (two) times daily.   gabapentin 100 MG capsule Commonly known as: NEURONTIN Take 100 mg by mouth 2 (  two) times daily.   magnesium hydroxide 400 MG/5ML suspension Commonly known as: MILK OF MAGNESIA Take 15 mLs by mouth daily as needed for mild constipation.   metoprolol tartrate 25 MG tablet Commonly known as: LOPRESSOR Take 0.5 tablets (12.5 mg total) by mouth 2 (two) times daily.   pantoprazole 40 MG tablet Commonly known as: Protonix Take 1 tablet (40  mg total) by mouth daily. What changed:   when to take this  reasons to take this   sevelamer carbonate 800 MG tablet Commonly known as: RENVELA Take 800 mg by mouth 3 (three) times daily with meals.   sodium bicarbonate 650 MG tablet Take 1 tablet (650 mg total) by mouth 2 (two) times daily.   Tyler Aas FlexTouch 100 UNIT/ML FlexTouch Pen Generic drug: insulin degludec Inject 12 Units into the skin daily in the afternoon.       Follow-up Information    Monico Blitz, MD Follow up in 1 week(s).   Specialty: Internal Medicine Contact information: [place] [address] [phone]        [place]. Schedule an appointment as soon as possible for a visit in 2 week(s).   Contact information: [place] [phone]             Allergies  Allergen Reactions  . Codeine Nausea And Vomiting    Consultations:  Nephrology  GI   Procedures/Studies:  No results found.   Discharge Exam: Vitals:   12/31/19 0900 12/31/19 1006  BP:    Pulse:    Resp:    Temp:  97.8 F (36.6 C)  SpO2: 98%    Vitals:   12/31/19 0500 12/31/19 0600 12/31/19 0900 12/31/19 1006  BP: 130/62 (!) 141/52    Pulse: 85 92    Resp: 12 11    Temp:    97.8 F (36.6 C)  TempSrc:    Oral  SpO2: 95% 98% 98%   Weight: 89.2 kg     Height:        General: Pt is alert, awake, not in acute distress Cardiovascular: RRR, S1/S2 +, no rubs, no gallops Respiratory: CTA bilaterally, no wheezing, no rhonchi Abdominal: Soft, NT, ND, bowel sounds + Extremities: no edema, no cyanosis    The results of significant diagnostics from this hospitalization (including imaging, microbiology, ancillary and laboratory) are listed below for reference.     Microbiology: Recent Results (from the past 240 hour(s))  Respiratory Panel by RT PCR (Flu A&B, Covid) - Nasopharyngeal Swab     Status: None   Collection Time: 12/27/19   3:58 PM   Specimen: Nasopharyngeal Swab  Result Value Ref Range Status   SARS Coronavirus 2 by RT PCR NEGATIVE NEGATIVE Final    Comment: (NOTE) SARS-CoV-2 target nucleic acids are NOT DETECTED.  The SARS-CoV-2 RNA is generally detectable in upper respiratoy specimens during the acute phase of infection. The lowest concentration of SARS-CoV-2 viral copies this assay can detect is 131 copies/mL. A negative result does not preclude SARS-Cov-2 infection and should not be used as the sole basis for treatment or other patient management decisions. A negative result may occur with  improper specimen collection/handling, submission of specimen other than nasopharyngeal swab, presence of viral mutation(s) within the areas targeted by this assay, and inadequate number of viral copies (<131 copies/mL). A negative result must be combined with clinical observations, patient history, and epidemiological information. The expected result is Negative.  Fact Sheet for  Patients:  PinkCheek.be  Fact Sheet for Healthcare Providers:  GravelBags.it  This test is no t yet approved or cleared by the [place] FDA and  has been authorized for detection and/or diagnosis of SARS-CoV-2 by FDA under an Emergency Use Authorization (EUA). This EUA will remain  in effect (meaning this test can be used) for the duration of the COVID-19 declaration under Section 564(b)(1) of the Act, 21 U.S.C. section 360bbb-3(b)(1), unless the authorization is terminated or revoked sooner.     Influenza A by PCR NEGATIVE NEGATIVE Final   Influenza B by PCR NEGATIVE NEGATIVE Final    Comment: (NOTE) The Xpert Xpress SARS-CoV-2/FLU/RSV assay is intended as an aid in  the diagnosis of influenza from Nasopharyngeal swab specimens and  should not be used as a sole basis for treatment. Nasal washings and  aspirates are unacceptable for Xpert Xpress SARS-CoV-2/FLU/RSV   testing.  Fact Sheet for Patients: PinkCheek.be  Fact Sheet for Healthcare Providers: GravelBags.it  This test is not yet approved or cleared by the [place] FDA and  has been authorized for detection and/or diagnosis of SARS-CoV-2 by  FDA under an Emergency Use Authorization (EUA). This EUA will remain  in effect (meaning this test can be used) for the duration of the  Covid-19 declaration under Section 564(b)(1) of the Act, 21  U.S.C. section 360bbb-3(b)(1), unless the authorization is  terminated or revoked. Performed at [facility], [address], [place], [place] [address]   MRSA PCR Screening     Status: None   Collection Time: 12/28/19 12:19 AM   Specimen: Nasopharyngeal  Result Value Ref Range Status   MRSA by PCR NEGATIVE NEGATIVE Final    Comment:        The GeneXpert MRSA Assay (FDA approved for NASAL specimens only), is one component of a comprehensive MRSA colonization surveillance program. It is not intended to diagnose MRSA infection nor to guide or monitor treatment for MRSA infections. Performed at [facility], [address], [place], [place] [address]      Labs: BNP (last 3 results) No results for input(s): BNP in the last 8760 hours. Basic Metabolic Panel: Recent Labs  Lab 12/27/19 1327 12/28/19 0521 12/29/19 0531 12/30/19 0500 12/31/19 0420  NA 135 134* 132* 131* 131*  K 4.3 4.0 4.5 4.8 3.6  CL 95* 98 96* 97* 94*  CO2 26 26 23 24 29   GLUCOSE 141* 120* 98 100* 115*  BUN 39* 48* 52* 57* 27*  CREATININE 6.00* 6.75* 7.25* 7.96* 4.55*  CALCIUM 9.7 8.6* 8.7* 8.2* 8.1*  MG 2.6*  --   --   --   --   PHOS  --   --   --  6.5* 4.5   Liver Function Tests: Recent Labs  Lab 12/27/19 1327 12/30/19 0500 12/31/19 0420  AST 24  --   --   ALT 18  --   --   ALKPHOS 74  --   --   BILITOT 0.9  --   --   PROT 6.7  --   --   ALBUMIN 3.6 2.5* 2.8*   No results for input(s): LIPASE,  AMYLASE in the last 168 hours. No results for input(s): AMMONIA in the last 168 hours. CBC: Recent Labs  Lab 12/28/19 0521 12/28/19 0521 12/28/19 1308 12/28/19 1308 12/29/19 0531 12/29/19 1757 12/30/19 0500 12/30/19 0500 12/30/19 0851 12/30/19 1753 12/30/19 2203 12/31/19 0420 12/31/19 0941  WBC 6.8  --  7.4  --  6.8  --  6.5  --   --   --   --  6.3  --   NEUTROABS  --   --   --   --   --   --  4.6  --   --   --   --   --   --   HGB 9.2*   < > 10.2*   < > 9.1*   < > 6.3*   < > 6.8* 8.8* 7.9* 8.4* 8.6*  HCT 28.5*   < > 31.4*   < > 28.8*   < > 19.4*   < > 21.8* 26.6* 24.4* 25.3* 26.3*  MCV 105.2*  --  106.4*  --  108.7*  --  108.4*  --   --   --   --  102.0*  --   PLT 127*  --  141*  --  127*  --  124*  --   --   --   --  107*  --    < > = values in this interval not displayed.   Cardiac Enzymes: No results for input(s): CKTOTAL, CKMB, CKMBINDEX, TROPONINI in the last 168 hours. BNP: Invalid input(s): POCBNP CBG: Recent Labs  Lab 12/30/19 0718 12/30/19 1122 12/30/19 1615 12/30/19 2205 12/31/19 0739  GLUCAP 86 145* 95 191* 119*   D-Dimer No results for input(s): DDIMER in the last 72 hours. Hgb A1c No results for input(s): HGBA1C in the last 72 hours. Lipid Profile No results for input(s): CHOL, HDL, LDLCALC, TRIG, CHOLHDL, LDLDIRECT in the last 72 hours. Thyroid function studies No results for input(s): TSH, T4TOTAL, T3FREE, THYROIDAB in the last 72 hours.  Invalid input(s): FREET3 Anemia work up No results for input(s): VITAMINB12, FOLATE, FERRITIN, TIBC, IRON, RETICCTPCT in the last 72 hours. Urinalysis    Component Value Date/Time   COLORURINE YELLOW 05/27/2014 1902   APPEARANCEUR CLEAR 05/27/2014 1902   LABSPEC 1.012 05/27/2014 1902   PHURINE 5.0 05/27/2014 1902   GLUCOSEU NEGATIVE 05/27/2014 1902   HGBUR NEGATIVE 05/27/2014 1902   BILIRUBINUR NEGATIVE 05/27/2014 1902   KETONESUR NEGATIVE 05/27/2014 1902   PROTEINUR 100 (A) 05/27/2014 1902    UROBILINOGEN 0.2 05/27/2014 1902   NITRITE NEGATIVE 05/27/2014 1902   LEUKOCYTESUR NEGATIVE 05/27/2014 1902   Sepsis Labs Invalid input(s): PROCALCITONIN,  WBC,  LACTICIDVEN Microbiology Recent Results (from the past 240 hour(s))  Respiratory Panel by RT PCR (Flu A&B, Covid) - Nasopharyngeal Swab     Status: None   Collection Time: 12/27/19  3:58 PM   Specimen: Nasopharyngeal Swab  Result Value Ref Range Status   SARS Coronavirus 2 by RT PCR NEGATIVE NEGATIVE Final    Comment: (NOTE) SARS-CoV-2 target nucleic acids are NOT DETECTED.  The SARS-CoV-2 RNA is generally detectable in upper respiratoy specimens during the acute phase of infection. The lowest concentration of SARS-CoV-2 viral copies this assay can detect is 131 copies/mL. A negative result does not preclude SARS-Cov-2 infection and should not be used as the sole basis for treatment or other patient management decisions. A negative result may occur with  improper specimen collection/handling, submission of specimen other than nasopharyngeal swab, presence of viral mutation(s) within the areas targeted by this assay, and inadequate number of viral copies (<131 copies/mL). A negative result must be combined with clinical observations, patient history, and epidemiological information. The expected result is Negative.  Fact Sheet for Patients:  PinkCheek.be  Fact Sheet for Healthcare Providers:  GravelBags.it  This test is no t yet approved or  cleared by the [place] and  has been authorized for detection and/or diagnosis of SARS-CoV-2 by FDA under an Emergency Use Authorization (EUA). This EUA will remain  in effect (meaning this test can be used) for the duration of the COVID-19 declaration under Section 564(b)(1) of the Act, 21 U.S.C. section 360bbb-3(b)(1), unless the authorization is terminated or revoked sooner.     Influenza A by PCR NEGATIVE  NEGATIVE Final   Influenza B by PCR NEGATIVE NEGATIVE Final    Comment: (NOTE) The Xpert Xpress SARS-CoV-2/FLU/RSV assay is intended as an aid in  the diagnosis of influenza from Nasopharyngeal swab specimens and  should not be used as a sole basis for treatment. Nasal washings and  aspirates are unacceptable for Xpert Xpress SARS-CoV-2/FLU/RSV  testing.  Fact Sheet for Patients: PinkCheek.be  Fact Sheet for Healthcare Providers: GravelBags.it  This test is not yet approved or cleared by the [place] FDA and  has been authorized for detection and/or diagnosis of SARS-CoV-2 by  FDA under an Emergency Use Authorization (EUA). This EUA will remain  in effect (meaning this test can be used) for the duration of the  Covid-19 declaration under Section 564(b)(1) of the Act, 21  U.S.C. section 360bbb-3(b)(1), unless the authorization is  terminated or revoked. Performed at [facility], [address] [place], [place] [address]   MRSA PCR Screening     Status: None   Collection Time: 12/28/19 12:19 AM   Specimen: Nasopharyngeal  Result Value Ref Range Status   MRSA by PCR NEGATIVE NEGATIVE Final    Comment:        The GeneXpert MRSA Assay (FDA approved for NASAL specimens only), is one component of a comprehensive MRSA colonization surveillance program. It is not intended to diagnose MRSA infection nor to guide or monitor treatment for MRSA infections. Performed at [facility], [address] [place], [place] [address]      Time coordinating discharge: 35 minutes  SIGNED:   Rodena Goldmann, DO [facility] 12/31/2019, 10:26 AM  If 7PM-7AM, please contact night-coverage [email or web address]

## 2019-12-31 NOTE — Progress Notes (Addendum)
Patient ID: Jody [name], female   DOB: 1940-11-17, 79 y.o.   MRN: [id number] S: no acute events, tolerating breakfast. Overall feels well. tolerated HD yesterday, s/p net UF 0.5L w/ PRBC transfusion, hgb stable. no complaints. O:BP (!) 141/52   Pulse 92   Temp 98.1 F (36.7 C) (Oral)   Resp 11   Ht 5\' 7"  (1.702 m)   Wt 89.2 kg   SpO2 98%   BMI 30.80 kg/m   Intake/Output Summary (Last 24 hours) at 12/31/2019 0926 Last data filed at 12/30/2019 1530 Gross per 24 hour  Intake 630 ml  Output 500 ml  Net 130 ml   Intake/Output: I/O last 3 completed shifts: In: 630 [Blood:630] Out: 500 [Other:500]  Intake/Output this shift:  No intake/output data recorded. Weight change: 0 kg Gen: NAD CVS: s1s2, rrr Resp: cta Abd: +BS, soft, Nt/ND Ext: no edema, RUE AVG +T/B  Recent Labs  Lab 12/27/19 1327 12/28/19 0521 12/29/19 0531 12/30/19 0500 12/31/19 0420  NA 135 134* 132* 131* 131*  K 4.3 4.0 4.5 4.8 3.6  CL 95* 98 96* 97* 94*  CO2 26 26 23 24 29   GLUCOSE 141* 120* 98 100* 115*  BUN 39* 48* 52* 57* 27*  CREATININE 6.00* 6.75* 7.25* 7.96* 4.55*  ALBUMIN 3.6  --   --  2.5* 2.8*  CALCIUM 9.7 8.6* 8.7* 8.2* 8.1*  PHOS  --   --   --  6.5* 4.5  AST 24  --   --   --   --   ALT 18  --   --   --   --    Liver Function Tests: Recent Labs  Lab 12/27/19 1327 12/30/19 0500 12/31/19 0420  AST 24  --   --   ALT 18  --   --   ALKPHOS 74  --   --   BILITOT 0.9  --   --   PROT 6.7  --   --   ALBUMIN 3.6 2.5* 2.8*   No results for input(s): LIPASE, AMYLASE in the last 168 hours. No results for input(s): AMMONIA in the last 168 hours. CBC: Recent Labs  Lab 12/28/19 0521 12/28/19 0521 12/28/19 1308 12/28/19 1308 12/29/19 0531 12/29/19 1757 12/30/19 0500 12/30/19 0851 12/30/19 1753 12/30/19 2203 12/31/19 0420  WBC 6.8   < > 7.4   < > 6.8  --  6.5  --   --   --  6.3  NEUTROABS  --   --   --   --   --   --  4.6  --   --   --   --   HGB 9.2*   < > 10.2*   < > 9.1*   < >  6.3*   < > 8.8* 7.9* 8.4*  HCT 28.5*   < > 31.4*   < > 28.8*   < > 19.4*   < > 26.6* 24.4* 25.3*  MCV 105.2*  --  106.4*  --  108.7*  --  108.4*  --   --   --  102.0*  PLT 127*   < > 141*   < > 127*  --  124*  --   --   --  107*   < > = values in this interval not displayed.   Cardiac Enzymes: No results for input(s): CKTOTAL, CKMB, CKMBINDEX, TROPONINI in the last 168 hours. CBG: Recent Labs  Lab 12/30/19 0718 12/30/19 1122 12/30/19 1615 12/30/19 2205 12/31/19 3825  GLUCAP 86 145* 95 191* 119*    Iron Studies: No results for input(s): IRON, TIBC, TRANSFERRIN, FERRITIN in the last 72 hours. Studies/Results: No results found. . sodium chloride   Intravenous Once  . sodium chloride   Intravenous Once  . sodium chloride   Intravenous Once  . Chlorhexidine Gluconate Cloth  6 each Topical Daily  . Chlorhexidine Gluconate Cloth  6 each Topical Q0600  . insulin aspart  0-9 Units Subcutaneous TID WC  . mometasone-formoterol  2 puff Inhalation BID  . pantoprazole (PROTONIX) IV  40 mg Intravenous Q24H    BMET    Component Value Date/Time   NA 131 (L) 12/31/2019 0420   K 3.6 12/31/2019 0420   CL 94 (L) 12/31/2019 0420   CO2 29 12/31/2019 0420   GLUCOSE 115 (H) 12/31/2019 0420   BUN 27 (H) 12/31/2019 0420   CREATININE 4.55 (H) 12/31/2019 0420   CALCIUM 8.1 (L) 12/31/2019 0420   GFRNONAA 9 (L) 12/31/2019 0420   GFRAA 9 (L) 10/27/2019 0112   CBC    Component Value Date/Time   WBC 6.3 12/31/2019 0420   RBC 2.48 (L) 12/31/2019 0420   HGB 8.4 (L) 12/31/2019 0420   HCT 25.3 (L) 12/31/2019 0420   PLT 107 (L) 12/31/2019 0420   MCV 102.0 (H) 12/31/2019 0420   MCH 33.9 12/31/2019 0420   MCHC 33.2 12/31/2019 0420   RDW 18.6 (H) 12/31/2019 0420   LYMPHSABS 1.2 12/30/2019 0500   MONOABS 0.4 12/30/2019 0500   EOSABS 0.3 12/30/2019 0500   BASOSABS 0.0 12/30/2019 0500    Dialysis Orders: Center: [facility] on TTS . EDW 87kg HD Bath 2K/2.5Ca  Time 3:15 Heparin 1000 units bolus.  Access RUE AVF BFR 400 DFR 600    Hectoral 2 mcg IV/HD Epogen 5000   Units IV/HD  Venofer  100 IV once a week   Assessment/Plan: 1.  Acute GIB- presumably due to diverticulosis s/p clipping (x2) of an actively bleeding diverticulum.  Hgb stable 8.4.  Rec'd prbc w/ HD yesterday.  Continue to follow H/H.  Appreciate GI assistance.  2.  ESRD -  HD tomorrow to maintain TTS schedule 3.  Hypertension/volume  - stable/controlled 4.  Anemia  -  As above 5.  Metabolic bone disease -  resume outpatient meds and follow.  6. Hyponatremia, suspecting secondary to low solute intake, utilizing 137Na bath 7.  Nutrition -  renal diet 8. Vascular Access- RUE AVF +T/B  Gean Quint, MD [facility]

## 2020-01-01 DIAGNOSIS — N2581 Secondary hyperparathyroidism of renal origin: Secondary | ICD-10-CM | POA: Diagnosis not present

## 2020-01-01 DIAGNOSIS — Z992 Dependence on renal dialysis: Secondary | ICD-10-CM | POA: Diagnosis not present

## 2020-01-01 DIAGNOSIS — N186 End stage renal disease: Secondary | ICD-10-CM | POA: Diagnosis not present

## 2020-01-03 DIAGNOSIS — N186 End stage renal disease: Secondary | ICD-10-CM | POA: Diagnosis not present

## 2020-01-03 DIAGNOSIS — Z992 Dependence on renal dialysis: Secondary | ICD-10-CM | POA: Diagnosis not present

## 2020-01-05 ENCOUNTER — Encounter (HOSPITAL_COMMUNITY): Payer: Self-pay | Admitting: Internal Medicine

## 2020-01-06 DIAGNOSIS — N186 End stage renal disease: Secondary | ICD-10-CM | POA: Diagnosis not present

## 2020-01-06 DIAGNOSIS — Z992 Dependence on renal dialysis: Secondary | ICD-10-CM | POA: Diagnosis not present

## 2020-01-08 DIAGNOSIS — N186 End stage renal disease: Secondary | ICD-10-CM | POA: Diagnosis not present

## 2020-01-08 DIAGNOSIS — Z992 Dependence on renal dialysis: Secondary | ICD-10-CM | POA: Diagnosis not present

## 2020-01-10 DIAGNOSIS — Z992 Dependence on renal dialysis: Secondary | ICD-10-CM | POA: Diagnosis not present

## 2020-01-10 DIAGNOSIS — N186 End stage renal disease: Secondary | ICD-10-CM | POA: Diagnosis not present

## 2020-01-13 DIAGNOSIS — J449 Chronic obstructive pulmonary disease, unspecified: Secondary | ICD-10-CM | POA: Diagnosis not present

## 2020-01-13 DIAGNOSIS — Z992 Dependence on renal dialysis: Secondary | ICD-10-CM | POA: Diagnosis not present

## 2020-01-13 DIAGNOSIS — E119 Type 2 diabetes mellitus without complications: Secondary | ICD-10-CM | POA: Diagnosis not present

## 2020-01-13 DIAGNOSIS — I10 Essential (primary) hypertension: Secondary | ICD-10-CM | POA: Diagnosis not present

## 2020-01-13 DIAGNOSIS — I509 Heart failure, unspecified: Secondary | ICD-10-CM | POA: Diagnosis not present

## 2020-01-13 DIAGNOSIS — N186 End stage renal disease: Secondary | ICD-10-CM | POA: Diagnosis not present

## 2020-01-13 DIAGNOSIS — N2581 Secondary hyperparathyroidism of renal origin: Secondary | ICD-10-CM | POA: Diagnosis not present

## 2020-01-14 DIAGNOSIS — E1122 Type 2 diabetes mellitus with diabetic chronic kidney disease: Secondary | ICD-10-CM | POA: Diagnosis not present

## 2020-01-14 DIAGNOSIS — Z299 Encounter for prophylactic measures, unspecified: Secondary | ICD-10-CM | POA: Diagnosis not present

## 2020-01-14 DIAGNOSIS — I110 Hypertensive heart disease with heart failure: Secondary | ICD-10-CM | POA: Diagnosis not present

## 2020-01-14 DIAGNOSIS — E1165 Type 2 diabetes mellitus with hyperglycemia: Secondary | ICD-10-CM | POA: Diagnosis not present

## 2020-01-14 DIAGNOSIS — N186 End stage renal disease: Secondary | ICD-10-CM | POA: Diagnosis not present

## 2020-01-15 DIAGNOSIS — Z992 Dependence on renal dialysis: Secondary | ICD-10-CM | POA: Diagnosis not present

## 2020-01-15 DIAGNOSIS — N186 End stage renal disease: Secondary | ICD-10-CM | POA: Diagnosis not present

## 2020-01-17 DIAGNOSIS — N186 End stage renal disease: Secondary | ICD-10-CM | POA: Diagnosis not present

## 2020-01-17 DIAGNOSIS — Z992 Dependence on renal dialysis: Secondary | ICD-10-CM | POA: Diagnosis not present

## 2020-01-20 DIAGNOSIS — N186 End stage renal disease: Secondary | ICD-10-CM | POA: Diagnosis not present

## 2020-01-20 DIAGNOSIS — Z992 Dependence on renal dialysis: Secondary | ICD-10-CM | POA: Diagnosis not present

## 2020-01-22 DIAGNOSIS — N186 End stage renal disease: Secondary | ICD-10-CM | POA: Diagnosis not present

## 2020-01-22 DIAGNOSIS — Z992 Dependence on renal dialysis: Secondary | ICD-10-CM | POA: Diagnosis not present

## 2020-01-24 DIAGNOSIS — Z992 Dependence on renal dialysis: Secondary | ICD-10-CM | POA: Diagnosis not present

## 2020-01-24 DIAGNOSIS — N186 End stage renal disease: Secondary | ICD-10-CM | POA: Diagnosis not present

## 2020-01-27 DIAGNOSIS — N186 End stage renal disease: Secondary | ICD-10-CM | POA: Diagnosis not present

## 2020-01-27 DIAGNOSIS — Z992 Dependence on renal dialysis: Secondary | ICD-10-CM | POA: Diagnosis not present

## 2020-01-27 DIAGNOSIS — N2581 Secondary hyperparathyroidism of renal origin: Secondary | ICD-10-CM | POA: Diagnosis not present

## 2020-01-28 DIAGNOSIS — I5032 Chronic diastolic (congestive) heart failure: Secondary | ICD-10-CM | POA: Diagnosis not present

## 2020-01-28 DIAGNOSIS — M6281 Muscle weakness (generalized): Secondary | ICD-10-CM | POA: Diagnosis not present

## 2020-01-28 DIAGNOSIS — J9601 Acute respiratory failure with hypoxia: Secondary | ICD-10-CM | POA: Diagnosis not present

## 2020-01-28 DIAGNOSIS — R6889 Other general symptoms and signs: Secondary | ICD-10-CM | POA: Diagnosis not present

## 2020-01-29 DIAGNOSIS — N186 End stage renal disease: Secondary | ICD-10-CM | POA: Diagnosis not present

## 2020-01-29 DIAGNOSIS — Z992 Dependence on renal dialysis: Secondary | ICD-10-CM | POA: Diagnosis not present

## 2020-01-31 DIAGNOSIS — Z992 Dependence on renal dialysis: Secondary | ICD-10-CM | POA: Diagnosis not present

## 2020-01-31 DIAGNOSIS — N186 End stage renal disease: Secondary | ICD-10-CM | POA: Diagnosis not present

## 2020-02-03 DIAGNOSIS — N186 End stage renal disease: Secondary | ICD-10-CM | POA: Diagnosis not present

## 2020-02-03 DIAGNOSIS — Z992 Dependence on renal dialysis: Secondary | ICD-10-CM | POA: Diagnosis not present

## 2020-02-05 DIAGNOSIS — N186 End stage renal disease: Secondary | ICD-10-CM | POA: Diagnosis not present

## 2020-02-05 DIAGNOSIS — Z992 Dependence on renal dialysis: Secondary | ICD-10-CM | POA: Diagnosis not present

## 2020-02-08 DIAGNOSIS — Z992 Dependence on renal dialysis: Secondary | ICD-10-CM | POA: Diagnosis not present

## 2020-02-08 DIAGNOSIS — N186 End stage renal disease: Secondary | ICD-10-CM | POA: Diagnosis not present

## 2020-02-10 DIAGNOSIS — D509 Iron deficiency anemia, unspecified: Secondary | ICD-10-CM | POA: Diagnosis not present

## 2020-02-10 DIAGNOSIS — N2581 Secondary hyperparathyroidism of renal origin: Secondary | ICD-10-CM | POA: Diagnosis not present

## 2020-02-10 DIAGNOSIS — Z992 Dependence on renal dialysis: Secondary | ICD-10-CM | POA: Diagnosis not present

## 2020-02-10 DIAGNOSIS — N186 End stage renal disease: Secondary | ICD-10-CM | POA: Diagnosis not present

## 2020-02-12 DIAGNOSIS — N186 End stage renal disease: Secondary | ICD-10-CM | POA: Diagnosis not present

## 2020-02-12 DIAGNOSIS — Z992 Dependence on renal dialysis: Secondary | ICD-10-CM | POA: Diagnosis not present

## 2020-02-13 DIAGNOSIS — Z992 Dependence on renal dialysis: Secondary | ICD-10-CM | POA: Diagnosis not present

## 2020-02-13 DIAGNOSIS — N186 End stage renal disease: Secondary | ICD-10-CM | POA: Diagnosis not present

## 2020-02-14 DIAGNOSIS — Z992 Dependence on renal dialysis: Secondary | ICD-10-CM | POA: Diagnosis not present

## 2020-02-14 DIAGNOSIS — N186 End stage renal disease: Secondary | ICD-10-CM | POA: Diagnosis not present

## 2020-02-17 DIAGNOSIS — N186 End stage renal disease: Secondary | ICD-10-CM | POA: Diagnosis not present

## 2020-02-17 DIAGNOSIS — Z992 Dependence on renal dialysis: Secondary | ICD-10-CM | POA: Diagnosis not present

## 2020-02-19 DIAGNOSIS — N186 End stage renal disease: Secondary | ICD-10-CM | POA: Diagnosis not present

## 2020-02-19 DIAGNOSIS — Z992 Dependence on renal dialysis: Secondary | ICD-10-CM | POA: Diagnosis not present

## 2020-02-21 DIAGNOSIS — Z992 Dependence on renal dialysis: Secondary | ICD-10-CM | POA: Diagnosis not present

## 2020-02-21 DIAGNOSIS — N186 End stage renal disease: Secondary | ICD-10-CM | POA: Diagnosis not present

## 2020-02-24 DIAGNOSIS — Z992 Dependence on renal dialysis: Secondary | ICD-10-CM | POA: Diagnosis not present

## 2020-02-24 DIAGNOSIS — N186 End stage renal disease: Secondary | ICD-10-CM | POA: Diagnosis not present

## 2020-02-26 DIAGNOSIS — N186 End stage renal disease: Secondary | ICD-10-CM | POA: Diagnosis not present

## 2020-02-26 DIAGNOSIS — Z992 Dependence on renal dialysis: Secondary | ICD-10-CM | POA: Diagnosis not present

## 2020-02-28 DIAGNOSIS — R6889 Other general symptoms and signs: Secondary | ICD-10-CM | POA: Diagnosis not present

## 2020-02-28 DIAGNOSIS — Z992 Dependence on renal dialysis: Secondary | ICD-10-CM | POA: Diagnosis not present

## 2020-02-28 DIAGNOSIS — J9601 Acute respiratory failure with hypoxia: Secondary | ICD-10-CM | POA: Diagnosis not present

## 2020-02-28 DIAGNOSIS — I5032 Chronic diastolic (congestive) heart failure: Secondary | ICD-10-CM | POA: Diagnosis not present

## 2020-02-28 DIAGNOSIS — N186 End stage renal disease: Secondary | ICD-10-CM | POA: Diagnosis not present

## 2020-02-28 DIAGNOSIS — M6281 Muscle weakness (generalized): Secondary | ICD-10-CM | POA: Diagnosis not present

## 2020-03-02 DIAGNOSIS — N186 End stage renal disease: Secondary | ICD-10-CM | POA: Diagnosis not present

## 2020-03-02 DIAGNOSIS — Z992 Dependence on renal dialysis: Secondary | ICD-10-CM | POA: Diagnosis not present

## 2020-03-04 DIAGNOSIS — Z992 Dependence on renal dialysis: Secondary | ICD-10-CM | POA: Diagnosis not present

## 2020-03-04 DIAGNOSIS — N186 End stage renal disease: Secondary | ICD-10-CM | POA: Diagnosis not present

## 2020-03-06 DIAGNOSIS — N186 End stage renal disease: Secondary | ICD-10-CM | POA: Diagnosis not present

## 2020-03-06 DIAGNOSIS — Z992 Dependence on renal dialysis: Secondary | ICD-10-CM | POA: Diagnosis not present

## 2020-03-09 DIAGNOSIS — Z992 Dependence on renal dialysis: Secondary | ICD-10-CM | POA: Diagnosis not present

## 2020-03-09 DIAGNOSIS — N186 End stage renal disease: Secondary | ICD-10-CM | POA: Diagnosis not present

## 2020-03-10 DIAGNOSIS — M1990 Unspecified osteoarthritis, unspecified site: Secondary | ICD-10-CM | POA: Diagnosis not present

## 2020-03-10 DIAGNOSIS — I10 Essential (primary) hypertension: Secondary | ICD-10-CM | POA: Diagnosis not present

## 2020-03-10 DIAGNOSIS — Z87891 Personal history of nicotine dependence: Secondary | ICD-10-CM | POA: Diagnosis not present

## 2020-03-10 DIAGNOSIS — Z992 Dependence on renal dialysis: Secondary | ICD-10-CM | POA: Diagnosis not present

## 2020-03-10 DIAGNOSIS — E1165 Type 2 diabetes mellitus with hyperglycemia: Secondary | ICD-10-CM | POA: Diagnosis not present

## 2020-03-10 DIAGNOSIS — Z299 Encounter for prophylactic measures, unspecified: Secondary | ICD-10-CM | POA: Diagnosis not present

## 2020-03-11 DIAGNOSIS — Z992 Dependence on renal dialysis: Secondary | ICD-10-CM | POA: Diagnosis not present

## 2020-03-11 DIAGNOSIS — N186 End stage renal disease: Secondary | ICD-10-CM | POA: Diagnosis not present

## 2020-03-13 DIAGNOSIS — N186 End stage renal disease: Secondary | ICD-10-CM | POA: Diagnosis not present

## 2020-03-13 DIAGNOSIS — Z992 Dependence on renal dialysis: Secondary | ICD-10-CM | POA: Diagnosis not present

## 2020-03-15 DIAGNOSIS — Z992 Dependence on renal dialysis: Secondary | ICD-10-CM | POA: Diagnosis not present

## 2020-03-15 DIAGNOSIS — N186 End stage renal disease: Secondary | ICD-10-CM | POA: Diagnosis not present

## 2020-03-16 DIAGNOSIS — N186 End stage renal disease: Secondary | ICD-10-CM | POA: Diagnosis not present

## 2020-03-16 DIAGNOSIS — Z992 Dependence on renal dialysis: Secondary | ICD-10-CM | POA: Diagnosis not present

## 2020-03-18 DIAGNOSIS — Z992 Dependence on renal dialysis: Secondary | ICD-10-CM | POA: Diagnosis not present

## 2020-03-18 DIAGNOSIS — N186 End stage renal disease: Secondary | ICD-10-CM | POA: Diagnosis not present

## 2020-03-20 DIAGNOSIS — Z992 Dependence on renal dialysis: Secondary | ICD-10-CM | POA: Diagnosis not present

## 2020-03-20 DIAGNOSIS — N186 End stage renal disease: Secondary | ICD-10-CM | POA: Diagnosis not present

## 2020-03-23 DIAGNOSIS — N186 End stage renal disease: Secondary | ICD-10-CM | POA: Diagnosis not present

## 2020-03-23 DIAGNOSIS — Z992 Dependence on renal dialysis: Secondary | ICD-10-CM | POA: Diagnosis not present

## 2020-03-25 DIAGNOSIS — N186 End stage renal disease: Secondary | ICD-10-CM | POA: Diagnosis not present

## 2020-03-25 DIAGNOSIS — Z992 Dependence on renal dialysis: Secondary | ICD-10-CM | POA: Diagnosis not present

## 2020-03-27 DIAGNOSIS — Z992 Dependence on renal dialysis: Secondary | ICD-10-CM | POA: Diagnosis not present

## 2020-03-27 DIAGNOSIS — N186 End stage renal disease: Secondary | ICD-10-CM | POA: Diagnosis not present

## 2020-03-30 DIAGNOSIS — N186 End stage renal disease: Secondary | ICD-10-CM | POA: Diagnosis not present

## 2020-03-30 DIAGNOSIS — N2581 Secondary hyperparathyroidism of renal origin: Secondary | ICD-10-CM | POA: Diagnosis not present

## 2020-03-30 DIAGNOSIS — J9601 Acute respiratory failure with hypoxia: Secondary | ICD-10-CM | POA: Diagnosis not present

## 2020-03-30 DIAGNOSIS — R6889 Other general symptoms and signs: Secondary | ICD-10-CM | POA: Diagnosis not present

## 2020-03-30 DIAGNOSIS — Z992 Dependence on renal dialysis: Secondary | ICD-10-CM | POA: Diagnosis not present

## 2020-03-30 DIAGNOSIS — M6281 Muscle weakness (generalized): Secondary | ICD-10-CM | POA: Diagnosis not present

## 2020-03-30 DIAGNOSIS — I5032 Chronic diastolic (congestive) heart failure: Secondary | ICD-10-CM | POA: Diagnosis not present

## 2020-04-01 DIAGNOSIS — Z992 Dependence on renal dialysis: Secondary | ICD-10-CM | POA: Diagnosis not present

## 2020-04-01 DIAGNOSIS — N186 End stage renal disease: Secondary | ICD-10-CM | POA: Diagnosis not present

## 2020-04-03 DIAGNOSIS — Z992 Dependence on renal dialysis: Secondary | ICD-10-CM | POA: Diagnosis not present

## 2020-04-03 DIAGNOSIS — N186 End stage renal disease: Secondary | ICD-10-CM | POA: Diagnosis not present

## 2020-04-06 DIAGNOSIS — N186 End stage renal disease: Secondary | ICD-10-CM | POA: Diagnosis not present

## 2020-04-06 DIAGNOSIS — Z992 Dependence on renal dialysis: Secondary | ICD-10-CM | POA: Diagnosis not present

## 2020-04-10 DIAGNOSIS — N186 End stage renal disease: Secondary | ICD-10-CM | POA: Diagnosis not present

## 2020-04-10 DIAGNOSIS — Z992 Dependence on renal dialysis: Secondary | ICD-10-CM | POA: Diagnosis not present

## 2020-04-12 DIAGNOSIS — J449 Chronic obstructive pulmonary disease, unspecified: Secondary | ICD-10-CM | POA: Diagnosis not present

## 2020-04-12 DIAGNOSIS — N184 Chronic kidney disease, stage 4 (severe): Secondary | ICD-10-CM | POA: Diagnosis not present

## 2020-04-12 DIAGNOSIS — E1122 Type 2 diabetes mellitus with diabetic chronic kidney disease: Secondary | ICD-10-CM | POA: Diagnosis not present

## 2020-04-12 DIAGNOSIS — N186 End stage renal disease: Secondary | ICD-10-CM | POA: Diagnosis not present

## 2020-04-12 DIAGNOSIS — Z992 Dependence on renal dialysis: Secondary | ICD-10-CM | POA: Diagnosis not present

## 2020-04-13 DIAGNOSIS — Z992 Dependence on renal dialysis: Secondary | ICD-10-CM | POA: Diagnosis not present

## 2020-04-13 DIAGNOSIS — D509 Iron deficiency anemia, unspecified: Secondary | ICD-10-CM | POA: Diagnosis not present

## 2020-04-13 DIAGNOSIS — N186 End stage renal disease: Secondary | ICD-10-CM | POA: Diagnosis not present

## 2020-04-13 DIAGNOSIS — N2581 Secondary hyperparathyroidism of renal origin: Secondary | ICD-10-CM | POA: Diagnosis not present

## 2020-04-15 DIAGNOSIS — N186 End stage renal disease: Secondary | ICD-10-CM | POA: Diagnosis not present

## 2020-04-15 DIAGNOSIS — Z992 Dependence on renal dialysis: Secondary | ICD-10-CM | POA: Diagnosis not present

## 2020-04-17 DIAGNOSIS — Z992 Dependence on renal dialysis: Secondary | ICD-10-CM | POA: Diagnosis not present

## 2020-04-17 DIAGNOSIS — N186 End stage renal disease: Secondary | ICD-10-CM | POA: Diagnosis not present

## 2020-04-20 DIAGNOSIS — Z992 Dependence on renal dialysis: Secondary | ICD-10-CM | POA: Diagnosis not present

## 2020-04-20 DIAGNOSIS — N186 End stage renal disease: Secondary | ICD-10-CM | POA: Diagnosis not present

## 2020-04-22 DIAGNOSIS — Z992 Dependence on renal dialysis: Secondary | ICD-10-CM | POA: Diagnosis not present

## 2020-04-22 DIAGNOSIS — N186 End stage renal disease: Secondary | ICD-10-CM | POA: Diagnosis not present

## 2020-04-24 DIAGNOSIS — Z992 Dependence on renal dialysis: Secondary | ICD-10-CM | POA: Diagnosis not present

## 2020-04-24 DIAGNOSIS — N186 End stage renal disease: Secondary | ICD-10-CM | POA: Diagnosis not present

## 2020-04-27 DIAGNOSIS — Z992 Dependence on renal dialysis: Secondary | ICD-10-CM | POA: Diagnosis not present

## 2020-04-27 DIAGNOSIS — J9601 Acute respiratory failure with hypoxia: Secondary | ICD-10-CM | POA: Diagnosis not present

## 2020-04-27 DIAGNOSIS — N186 End stage renal disease: Secondary | ICD-10-CM | POA: Diagnosis not present

## 2020-04-27 DIAGNOSIS — R6889 Other general symptoms and signs: Secondary | ICD-10-CM | POA: Diagnosis not present

## 2020-04-27 DIAGNOSIS — I5032 Chronic diastolic (congestive) heart failure: Secondary | ICD-10-CM | POA: Diagnosis not present

## 2020-04-27 DIAGNOSIS — M6281 Muscle weakness (generalized): Secondary | ICD-10-CM | POA: Diagnosis not present

## 2020-04-29 DIAGNOSIS — Z992 Dependence on renal dialysis: Secondary | ICD-10-CM | POA: Diagnosis not present

## 2020-04-29 DIAGNOSIS — N186 End stage renal disease: Secondary | ICD-10-CM | POA: Diagnosis not present

## 2020-05-01 DIAGNOSIS — N186 End stage renal disease: Secondary | ICD-10-CM | POA: Diagnosis not present

## 2020-05-01 DIAGNOSIS — Z992 Dependence on renal dialysis: Secondary | ICD-10-CM | POA: Diagnosis not present

## 2020-05-04 DIAGNOSIS — Z992 Dependence on renal dialysis: Secondary | ICD-10-CM | POA: Diagnosis not present

## 2020-05-04 DIAGNOSIS — N186 End stage renal disease: Secondary | ICD-10-CM | POA: Diagnosis not present

## 2020-05-06 DIAGNOSIS — Z992 Dependence on renal dialysis: Secondary | ICD-10-CM | POA: Diagnosis not present

## 2020-05-06 DIAGNOSIS — N186 End stage renal disease: Secondary | ICD-10-CM | POA: Diagnosis not present

## 2020-05-07 DIAGNOSIS — E7800 Pure hypercholesterolemia, unspecified: Secondary | ICD-10-CM | POA: Diagnosis not present

## 2020-05-07 DIAGNOSIS — Z7189 Other specified counseling: Secondary | ICD-10-CM | POA: Diagnosis not present

## 2020-05-07 DIAGNOSIS — Z79899 Other long term (current) drug therapy: Secondary | ICD-10-CM | POA: Diagnosis not present

## 2020-05-07 DIAGNOSIS — Z299 Encounter for prophylactic measures, unspecified: Secondary | ICD-10-CM | POA: Diagnosis not present

## 2020-05-07 DIAGNOSIS — R5383 Other fatigue: Secondary | ICD-10-CM | POA: Diagnosis not present

## 2020-05-07 DIAGNOSIS — I10 Essential (primary) hypertension: Secondary | ICD-10-CM | POA: Diagnosis not present

## 2020-05-07 DIAGNOSIS — Z0000 Encounter for general adult medical examination without abnormal findings: Secondary | ICD-10-CM | POA: Diagnosis not present

## 2020-05-08 DIAGNOSIS — Z992 Dependence on renal dialysis: Secondary | ICD-10-CM | POA: Diagnosis not present

## 2020-05-08 DIAGNOSIS — N186 End stage renal disease: Secondary | ICD-10-CM | POA: Diagnosis not present

## 2020-05-11 DIAGNOSIS — D509 Iron deficiency anemia, unspecified: Secondary | ICD-10-CM | POA: Diagnosis not present

## 2020-05-11 DIAGNOSIS — N186 End stage renal disease: Secondary | ICD-10-CM | POA: Diagnosis not present

## 2020-05-11 DIAGNOSIS — Z992 Dependence on renal dialysis: Secondary | ICD-10-CM | POA: Diagnosis not present

## 2020-05-13 DIAGNOSIS — Z992 Dependence on renal dialysis: Secondary | ICD-10-CM | POA: Diagnosis not present

## 2020-05-13 DIAGNOSIS — N186 End stage renal disease: Secondary | ICD-10-CM | POA: Diagnosis not present

## 2020-05-15 DIAGNOSIS — Z992 Dependence on renal dialysis: Secondary | ICD-10-CM | POA: Diagnosis not present

## 2020-05-15 DIAGNOSIS — N186 End stage renal disease: Secondary | ICD-10-CM | POA: Diagnosis not present

## 2020-05-18 DIAGNOSIS — Z992 Dependence on renal dialysis: Secondary | ICD-10-CM | POA: Diagnosis not present

## 2020-05-18 DIAGNOSIS — N186 End stage renal disease: Secondary | ICD-10-CM | POA: Diagnosis not present

## 2020-05-20 DIAGNOSIS — E559 Vitamin D deficiency, unspecified: Secondary | ICD-10-CM | POA: Diagnosis not present

## 2020-05-20 DIAGNOSIS — Z992 Dependence on renal dialysis: Secondary | ICD-10-CM | POA: Diagnosis not present

## 2020-05-20 DIAGNOSIS — N186 End stage renal disease: Secondary | ICD-10-CM | POA: Diagnosis not present

## 2020-05-22 DIAGNOSIS — Z992 Dependence on renal dialysis: Secondary | ICD-10-CM | POA: Diagnosis not present

## 2020-05-22 DIAGNOSIS — N186 End stage renal disease: Secondary | ICD-10-CM | POA: Diagnosis not present

## 2020-05-25 DIAGNOSIS — Z992 Dependence on renal dialysis: Secondary | ICD-10-CM | POA: Diagnosis not present

## 2020-05-25 DIAGNOSIS — N186 End stage renal disease: Secondary | ICD-10-CM | POA: Diagnosis not present

## 2020-05-27 DIAGNOSIS — Z992 Dependence on renal dialysis: Secondary | ICD-10-CM | POA: Diagnosis not present

## 2020-05-27 DIAGNOSIS — N186 End stage renal disease: Secondary | ICD-10-CM | POA: Diagnosis not present

## 2020-05-28 DIAGNOSIS — M6281 Muscle weakness (generalized): Secondary | ICD-10-CM | POA: Diagnosis not present

## 2020-05-28 DIAGNOSIS — J9601 Acute respiratory failure with hypoxia: Secondary | ICD-10-CM | POA: Diagnosis not present

## 2020-05-28 DIAGNOSIS — R6889 Other general symptoms and signs: Secondary | ICD-10-CM | POA: Diagnosis not present

## 2020-05-28 DIAGNOSIS — I5032 Chronic diastolic (congestive) heart failure: Secondary | ICD-10-CM | POA: Diagnosis not present

## 2020-05-29 DIAGNOSIS — Z992 Dependence on renal dialysis: Secondary | ICD-10-CM | POA: Diagnosis not present

## 2020-05-29 DIAGNOSIS — N186 End stage renal disease: Secondary | ICD-10-CM | POA: Diagnosis not present

## 2020-06-01 DIAGNOSIS — Z992 Dependence on renal dialysis: Secondary | ICD-10-CM | POA: Diagnosis not present

## 2020-06-01 DIAGNOSIS — N186 End stage renal disease: Secondary | ICD-10-CM | POA: Diagnosis not present

## 2020-06-03 ENCOUNTER — Other Ambulatory Visit: Payer: Self-pay

## 2020-06-03 ENCOUNTER — Emergency Department (HOSPITAL_COMMUNITY)
Admission: EM | Admit: 2020-06-03 | Discharge: 2020-06-03 | Disposition: A | Discharge status: Home / Self Care | Payer: Medicare Other | Attending: Emergency Medicine | Admitting: Emergency Medicine

## 2020-06-03 ENCOUNTER — Encounter (HOSPITAL_COMMUNITY): Payer: Self-pay | Admitting: Emergency Medicine

## 2020-06-03 ENCOUNTER — Emergency Department (HOSPITAL_COMMUNITY): Payer: Medicare Other

## 2020-06-03 DIAGNOSIS — N186 End stage renal disease: Secondary | ICD-10-CM | POA: Diagnosis not present

## 2020-06-03 DIAGNOSIS — Z20822 Contact with and (suspected) exposure to covid-19: Secondary | ICD-10-CM | POA: Diagnosis not present

## 2020-06-03 DIAGNOSIS — Z87891 Personal history of nicotine dependence: Secondary | ICD-10-CM | POA: Insufficient documentation

## 2020-06-03 DIAGNOSIS — R059 Cough, unspecified: Secondary | ICD-10-CM | POA: Insufficient documentation

## 2020-06-03 DIAGNOSIS — I132 Hypertensive heart and chronic kidney disease with heart failure and with stage 5 chronic kidney disease, or end stage renal disease: Secondary | ICD-10-CM | POA: Insufficient documentation

## 2020-06-03 DIAGNOSIS — J449 Chronic obstructive pulmonary disease, unspecified: Secondary | ICD-10-CM | POA: Diagnosis not present

## 2020-06-03 DIAGNOSIS — Z96651 Presence of right artificial knee joint: Secondary | ICD-10-CM | POA: Insufficient documentation

## 2020-06-03 DIAGNOSIS — R5383 Other fatigue: Secondary | ICD-10-CM | POA: Insufficient documentation

## 2020-06-03 DIAGNOSIS — Z992 Dependence on renal dialysis: Secondary | ICD-10-CM | POA: Insufficient documentation

## 2020-06-03 DIAGNOSIS — R0602 Shortness of breath: Secondary | ICD-10-CM | POA: Diagnosis not present

## 2020-06-03 DIAGNOSIS — E1122 Type 2 diabetes mellitus with diabetic chronic kidney disease: Secondary | ICD-10-CM | POA: Insufficient documentation

## 2020-06-03 DIAGNOSIS — I5033 Acute on chronic diastolic (congestive) heart failure: Secondary | ICD-10-CM | POA: Insufficient documentation

## 2020-06-03 LAB — CBC WITH DIFFERENTIAL/PLATELET
Abs Immature Granulocytes: 0.02 10*3/uL (ref 0.00–0.07)
Basophils Absolute: 0 10*3/uL (ref 0.0–0.1)
Basophils Relative: 1 %
Eosinophils Absolute: 0.1 10*3/uL (ref 0.0–0.5)
Eosinophils Relative: 1 %
HCT: 33.3 % — ABNORMAL LOW (ref 36.0–46.0)
Hemoglobin: 10 g/dL — ABNORMAL LOW (ref 12.0–15.0)
Immature Granulocytes: 0 %
Lymphocytes Relative: 11 %
Lymphs Abs: 0.6 10*3/uL — ABNORMAL LOW (ref 0.7–4.0)
MCH: 34.8 pg — ABNORMAL HIGH (ref 26.0–34.0)
MCHC: 30 g/dL (ref 30.0–36.0)
MCV: 116 fL — ABNORMAL HIGH (ref 80.0–100.0)
Monocytes Absolute: 0.3 10*3/uL (ref 0.1–1.0)
Monocytes Relative: 6 %
Neutro Abs: 4.9 10*3/uL (ref 1.7–7.7)
Neutrophils Relative %: 81 %
Platelets: 148 10*3/uL — ABNORMAL LOW (ref 150–400)
RBC: 2.87 MIL/uL — ABNORMAL LOW (ref 3.87–5.11)
RDW: 14.6 % (ref 11.5–15.5)
WBC: 5.9 10*3/uL (ref 4.0–10.5)
nRBC: 0 % (ref 0.0–0.2)

## 2020-06-03 LAB — RESP PANEL BY RT-PCR (FLU A&B, COVID) ARPGX2
Influenza A by PCR: NEGATIVE
Influenza B by PCR: NEGATIVE
SARS Coronavirus 2 by RT PCR: NEGATIVE

## 2020-06-03 LAB — COMPREHENSIVE METABOLIC PANEL
ALT: 21 U/L (ref 0–44)
AST: 24 U/L (ref 15–41)
Albumin: 3.9 g/dL (ref 3.5–5.0)
Alkaline Phosphatase: 71 U/L (ref 38–126)
Anion gap: 15 (ref 5–15)
BUN: 30 mg/dL — ABNORMAL HIGH (ref 8–23)
CO2: 24 mmol/L (ref 22–32)
Calcium: 10 mg/dL (ref 8.9–10.3)
Chloride: 99 mmol/L (ref 98–111)
Creatinine, Ser: 5.55 mg/dL — ABNORMAL HIGH (ref 0.44–1.00)
GFR, Estimated: 7 mL/min — ABNORMAL LOW (ref >60)
Glucose, Bld: 118 mg/dL — ABNORMAL HIGH (ref 70–99)
Potassium: 3.7 mmol/L (ref 3.5–5.1)
Sodium: 138 mmol/L (ref 135–145)
Total Bilirubin: 1.2 mg/dL (ref 0.3–1.2)
Total Protein: 7.3 g/dL (ref 6.5–8.1)

## 2020-06-03 NOTE — ED Triage Notes (Signed)
Pt c/o sob about noon yesterday but isn't sob now. Pt states she is scheduled to have dialysis today.

## 2020-06-03 NOTE — ED Provider Notes (Signed)
Emergency Department Provider Note   I have reviewed the triage vital signs and the nursing notes.   HISTORY  Chief Complaint Shortness of Breath   HPI Jody [name] is a 80 y.o. female with PMH reviewed below including ESRD presents to the emergency department with fatigue and shortness of breath developing over the past 24 hours.  She is due to have dialysis later this morning at 10 AM.  She denies any associated chest pain or abdominal pain.  She does have some mild cough at times but has not experienced fevers.  She was feeling short of breath especially around lunchtime yesterday but most of the symptoms have resolved.  She is feeling some fatigue and so decided to present for evaluation.  She tells me that she does not think she will be going to dialysis later today telling me that she is only missed once ever and that many people at the dialysis center miss sessions frequently.   Past Medical History:  Diagnosis Date  . Anemia in chronic kidney disease (CODE) 06/05/2015  . Arthritis    "bad in my legs" (12/07/2014)  . Chronic back pain    "dr said my spine is crooked"  . Chronic bronchitis ([place])    "get it q yr" (12/07/2014)  . CKD stage 4 due to type 2 diabetes mellitus ([place]) 06/05/2015  . Complication of anesthesia    headache for 2 days after surgery in May 2019  . COPD (chronic obstructive pulmonary disease) ([place])   . Gait difficulty 09/02/2014  . Hypercholesterolemia   . Hypertension   . Neuropathy 2015   in legs  . NSVT (nonsustained ventricular tachycardia) ([place])   . PAF (paroxysmal atrial fibrillation) ([place]) 06/05/2015  . Sinus brady-tachy syndrome ([place])   . Stroke ([place]) 05/2014   denies residual on 12/07/2014  . Type II diabetes mellitus [facility])     Patient Active Problem List   Diagnosis Date Noted  . Rectal bleeding 12/27/2019  . Acute GI bleeding 12/27/2019  . Nonsustained ventricular tachycardia ([place]) 10/27/2019  . Acute respiratory failure with  hypoxia ([place]) 10/26/2019  . Megaloblastic anemia 10/26/2019  . Chronic blood loss anemia 10/26/2017  . Arthritis 10/26/2017  . Acute on chronic blood loss anemia 10/26/2017  . Iron deficiency anemia 02/23/2017  . Chronic diastolic CHF (congestive heart failure) ([place]) 10/15/2016  . Elevated troponin 10/15/2016  . Type 2 diabetes mellitus with nephropathy ([place]) 10/15/2016  . ESRD on dialysis ([place]) 10/15/2016  . NSTEMI (non-ST elevated myocardial infarction) ([place]) 10/03/2015  . Elevated LFTs 10/03/2015  . Encounter for therapeutic drug monitoring 07/29/2015  . COPD (chronic obstructive pulmonary disease) ([place]) 06/05/2015  . CKD stage 4 due to type 2 diabetes mellitus ([place]) 06/05/2015  . Anemia in chronic kidney disease, Stage IV 06/05/2015  . Acute on chronic diastolic CHF (congestive heart failure) ([place]) 06/05/2015  . Atrial fibrillation ([place]) 06/05/2015  . Atrial fibrillation with rapid ventricular response ([place]) 12/07/2014  . TIA (transient ischemic attack) 09/02/2014  . Gait difficulty 09/02/2014  . Other specified transient cerebral ischemias   . Stroke with cerebral ischemia ([place])   . Hemispheric carotid artery syndrome   . Essential hypertension   . Hyperlipidemia   . Type 2 diabetes mellitus with other circulatory complications ([place])   . Cerebral infarction ([place]) 05/27/2014    Past Surgical History:  Procedure Laterality Date  . ABDOMINAL HYSTERECTOMY  ~ 1970  . APPENDECTOMY  ~ 1970  . AV FISTULA PLACEMENT Right 07/06/2017  Procedure: RIGHT RADIOCEPHALIC ARTERIOVENOUS FISTULA creation;  Surgeon: Conrad La Feria North, MD;  Location: [facility];  Service: Vascular;  Laterality: Right;  . BASCILIC VEIN TRANSPOSITION Right 09/17/2017   Procedure: SECOND STAGE BASILIC VEIN TRANSPOSITION RIGHT ARM;  Surgeon: Rosetta Posner, MD;  Location: [facility];  Service: Vascular;  Laterality: Right;  . COLONOSCOPY N/A 10/28/2017   Procedure: COLONOSCOPY;  Surgeon: Rogene Houston, MD;  [facility];  Service: Endoscopy;  Laterality: N/A;  . COLONOSCOPY WITH PROPOFOL N/A 12/29/2019   Procedure: COLONOSCOPY WITH PROPOFOL;  Surgeon: Eloise Harman, DO;  [facility];  Service: Endoscopy;  Laterality: N/A;  . ESOPHAGOGASTRODUODENOSCOPY (EGD) WITH PROPOFOL N/A 10/31/2017   Procedure: ESOPHAGOGASTRODUODENOSCOPY (EGD) WITH PROPOFOL;  Surgeon: Rogene Houston, MD;  [facility];  Service: Endoscopy;  Laterality: N/A;  . JOINT REPLACEMENT    . TOTAL KNEE ARTHROPLASTY Right ~ 1986  . TUMOR EXCISION  ~ 1970   "9# in my stomach"    Allergies Codeine  Family History  Problem Relation Age of Onset  . Cancer Other   . Heart attack Brother   . Cancer Sister        breast  . Alcohol abuse Brother     Social History Social History   Tobacco Use  . Smoking status: Former Smoker    Packs/day: 0.50    Years: 50.00    Pack years: 25.00    Types: Cigarettes    Start date: 03/07/1953    Quit date: 01/14/2004    Years since quitting: 16.3  . Smokeless tobacco: Never Used  . Tobacco comment: "quit smoking cigarettes  in ~ 2005"  Vaping Use  . Vaping Use: Never used  Substance Use Topics  . Alcohol use: No    Alcohol/week: 0.0 standard drinks    Comment: 10/242016 "quit drinking beer in ~ 1977"  . Drug use: No    Review of Systems  Constitutional: No fever/chills. Positive weakness (improved).  Eyes: No visual changes. ENT: No sore throat. Cardiovascular: Denies chest pain. Respiratory: Positive shortness of breath (improved).  Gastrointestinal: No abdominal pain.  No nausea, no vomiting.  No diarrhea.  No constipation. Musculoskeletal: Negative for back pain. Skin: Negative for rash. Neurological: Negative for headaches, focal weakness or numbness.  10-point ROS otherwise negative.  ____________________________________________   PHYSICAL EXAM:  VITAL SIGNS: ED Triage Vitals  Enc Vitals Group     BP 06/03/20 0302 (!) 155/99     Pulse Rate  06/03/20 0302 94     Resp 06/03/20 0302 20     Temp 06/03/20 0302 98.4 F (36.9 C)     Temp Source 06/03/20 0302 Oral     SpO2 06/03/20 0302 97 %     Weight 06/03/20 0300 213 lb (96.6 kg)     Height 06/03/20 0300 5\' 7"  (1.702 m)   Constitutional: Alert and oriented. Well appearing and in no acute distress. Eyes: Conjunctivae are normal.  Head: Atraumatic. Nose: No congestion/rhinnorhea. Mouth/Throat: Mucous membranes are moist.   Neck: No stridor.  Cardiovascular: Irregularly irregular. Good peripheral circulation. Grossly normal heart sounds. Well-appearing right arm fistula.  Respiratory: Normal respiratory effort.  No retractions. Lungs CTAB. Gastrointestinal: Soft and nontender. No distention.  Musculoskeletal: No gross deformities of extremities. Neurologic:  Normal speech and language. No gross focal neurologic deficits are appreciated.  Skin:  Skin is warm, dry and intact. No rash noted.   ____________________________________________   LABS (all labs ordered are listed,  but only abnormal results are displayed)  Labs Reviewed  COMPREHENSIVE METABOLIC PANEL - Abnormal; Notable for the following components:      Result Value   Glucose, Bld 118 (*)    BUN 30 (*)    Creatinine, Ser 5.55 (*)    GFR, Estimated 7 (*)    All other components within normal limits  CBC WITH DIFFERENTIAL/PLATELET - Abnormal; Notable for the following components:   RBC 2.87 (*)    Hemoglobin 10.0 (*)    HCT 33.3 (*)    MCV 116.0 (*)    MCH 34.8 (*)    Platelets 148 (*)    Lymphs Abs 0.6 (*)    All other components within normal limits  RESP PANEL BY RT-PCR (FLU A&B, COVID) [id number]   ____________________________________________  EKG   EKG Interpretation  Date/Time:  Thursday June 03 2020 03:05:19 EDT Ventricular Rate:  102 PR Interval:    QRS Duration: 91 QT Interval:  321 QTC Calculation: 419 R Axis:   54 Text Interpretation: Atrial fibrillation Nonspecific repol abnormality,  lateral leads Confirmed by Nanda Quinton [phone]) on 06/03/2020 3:12:07 AM       ____________________________________________  RADIOLOGY  DG Chest Portable 1 View  Result Date: 06/03/2020 CLINICAL DATA:  Shortness of breath EXAM: PORTABLE CHEST 1 VIEW COMPARISON:  10/26/2019 FINDINGS: Cardiac shadow is enlarged but stable. Aortic calcifications are again seen. Mild vascular congestion is noted similar to that seen on prior exam without significant edema. No focal infiltrate or effusion noted. No bony abnormality is seen. IMPRESSION: Changes of mild vascular congestion without significant edema. The overall appearance is similar to that seen on the prior exam. Electronically Signed   By: Inez Catalina M.D.   On: 06/03/2020 03:44    ____________________________________________   PROCEDURES  Procedure(s) performed:   Procedures  None  ____________________________________________   INITIAL IMPRESSION / ASSESSMENT AND PLAN / ED COURSE  Pertinent labs & imaging results that were available during my care of the patient were reviewed by me and considered in my medical decision making (see chart for details).   Patient presents to the emergency department with shortness of breath mainly yesterday which has improved.  She is well-appearing here.  She is awake and alert does not appear uremic clinically.  She has normal vital signs with A. fib which is documented in her chart from prior visits.  Her EKG here looks similar to prior tracings.  She is not having chest pain to suspect atypical ACS.  Her breathing rate and oxygenation are within normal limits.  She is not requiring supplemental oxygen.   Chest x-ray obtained here which is similar to her prior x-rays not showing pulmonary edema.  Lab work is similarly in line with prior values showing ESRD but potassium within normal limits and BUN similar to prior values.  Anemia is present but improved from prior values with history of GI bleeding.   She overall looks well.   She verbalized an intention to miss dialysis today.  We had a Haelyn Forgey discussion regarding this.  I told her that I thought that some of her symptoms may improve with dialysis and that missing dialysis could lead to not only feeling worse but potentially fatal side effects such as electrolyte disturbance, arrhythmia, cardiac arrest.  Patient tells me ultimately that if her transportation arrives she will go later this AM. We discussed ED return precautions in detail.    ____________________________________________  FINAL CLINICAL IMPRESSION(S) / ED DIAGNOSES  Final diagnoses:  SOB (shortness of breath)    Note:  This document was prepared using Dragon voice recognition software and may include unintentional dictation errors.  Nanda Quinton, MD, [facility]    Gregg Winchell, [facility], MD 06/03/20 [id number]

## 2020-06-03 NOTE — Discharge Instructions (Signed)
You were seen in the emerge department today with trouble breathing.  Your lab work looks normal and your chest x-ray does not show pneumonia or fluid on the lungs.  Your COVID and flu tests were negative.  It is very important that you make it to your dialysis session today.  I know that you are not feeling well but often times dialysis can help these types of symptoms.  If you delay your dialysis or miss your session today it can make things worse.

## 2020-06-03 NOTE — ED Notes (Signed)
Pt wheeled to lobby to call for her ride. Alert and oriented x4

## 2020-06-05 DIAGNOSIS — N186 End stage renal disease: Secondary | ICD-10-CM | POA: Diagnosis not present

## 2020-06-05 DIAGNOSIS — Z992 Dependence on renal dialysis: Secondary | ICD-10-CM | POA: Diagnosis not present

## 2020-06-06 ENCOUNTER — Encounter (HOSPITAL_COMMUNITY): Payer: Self-pay

## 2020-06-06 ENCOUNTER — Emergency Department (HOSPITAL_COMMUNITY)
Admission: EM | Admit: 2020-06-06 | Discharge: 2020-06-06 | Disposition: A | Discharge status: Home / Self Care | Payer: Medicare Other | Attending: Emergency Medicine | Admitting: Emergency Medicine

## 2020-06-06 ENCOUNTER — Emergency Department (HOSPITAL_COMMUNITY): Payer: Medicare Other

## 2020-06-06 ENCOUNTER — Other Ambulatory Visit: Payer: Self-pay

## 2020-06-06 DIAGNOSIS — I5033 Acute on chronic diastolic (congestive) heart failure: Secondary | ICD-10-CM | POA: Insufficient documentation

## 2020-06-06 DIAGNOSIS — Z7901 Long term (current) use of anticoagulants: Secondary | ICD-10-CM | POA: Insufficient documentation

## 2020-06-06 DIAGNOSIS — Z96641 Presence of right artificial hip joint: Secondary | ICD-10-CM | POA: Diagnosis not present

## 2020-06-06 DIAGNOSIS — E1121 Type 2 diabetes mellitus with diabetic nephropathy: Secondary | ICD-10-CM | POA: Diagnosis not present

## 2020-06-06 DIAGNOSIS — Z7951 Long term (current) use of inhaled steroids: Secondary | ICD-10-CM | POA: Insufficient documentation

## 2020-06-06 DIAGNOSIS — R531 Weakness: Secondary | ICD-10-CM | POA: Diagnosis not present

## 2020-06-06 DIAGNOSIS — Z87891 Personal history of nicotine dependence: Secondary | ICD-10-CM | POA: Insufficient documentation

## 2020-06-06 DIAGNOSIS — R0602 Shortness of breath: Secondary | ICD-10-CM | POA: Diagnosis not present

## 2020-06-06 DIAGNOSIS — J449 Chronic obstructive pulmonary disease, unspecified: Secondary | ICD-10-CM | POA: Insufficient documentation

## 2020-06-06 DIAGNOSIS — Z992 Dependence on renal dialysis: Secondary | ICD-10-CM | POA: Insufficient documentation

## 2020-06-06 DIAGNOSIS — I132 Hypertensive heart and chronic kidney disease with heart failure and with stage 5 chronic kidney disease, or end stage renal disease: Secondary | ICD-10-CM | POA: Insufficient documentation

## 2020-06-06 DIAGNOSIS — N186 End stage renal disease: Secondary | ICD-10-CM | POA: Diagnosis not present

## 2020-06-06 DIAGNOSIS — I482 Chronic atrial fibrillation, unspecified: Secondary | ICD-10-CM | POA: Insufficient documentation

## 2020-06-06 DIAGNOSIS — I120 Hypertensive chronic kidney disease with stage 5 chronic kidney disease or end stage renal disease: Secondary | ICD-10-CM | POA: Diagnosis not present

## 2020-06-06 DIAGNOSIS — I517 Cardiomegaly: Secondary | ICD-10-CM | POA: Diagnosis not present

## 2020-06-06 LAB — BASIC METABOLIC PANEL
Anion gap: 17 — ABNORMAL HIGH (ref 5–15)
BUN: 31 mg/dL — ABNORMAL HIGH (ref 8–23)
CO2: 25 mmol/L (ref 22–32)
Calcium: 9.6 mg/dL (ref 8.9–10.3)
Chloride: 94 mmol/L — ABNORMAL LOW (ref 98–111)
Creatinine, Ser: 4.84 mg/dL — ABNORMAL HIGH (ref 0.44–1.00)
GFR, Estimated: 8 mL/min — ABNORMAL LOW (ref >60)
Glucose, Bld: 93 mg/dL (ref 70–99)
Potassium: 3.2 mmol/L — ABNORMAL LOW (ref 3.5–5.1)
Sodium: 136 mmol/L (ref 135–145)

## 2020-06-06 LAB — CBC WITH DIFFERENTIAL/PLATELET
Abs Immature Granulocytes: 0.02 10*3/uL (ref 0.00–0.07)
Basophils Absolute: 0.1 10*3/uL (ref 0.0–0.1)
Basophils Relative: 1 %
Eosinophils Absolute: 0.3 10*3/uL (ref 0.0–0.5)
Eosinophils Relative: 5 %
HCT: 33.3 % — ABNORMAL LOW (ref 36.0–46.0)
Hemoglobin: 10.3 g/dL — ABNORMAL LOW (ref 12.0–15.0)
Immature Granulocytes: 0 %
Lymphocytes Relative: 20 %
Lymphs Abs: 1 10*3/uL (ref 0.7–4.0)
MCH: 35.4 pg — ABNORMAL HIGH (ref 26.0–34.0)
MCHC: 30.9 g/dL (ref 30.0–36.0)
MCV: 114.4 fL — ABNORMAL HIGH (ref 80.0–100.0)
Monocytes Absolute: 0.4 10*3/uL (ref 0.1–1.0)
Monocytes Relative: 8 %
Neutro Abs: 3.2 10*3/uL (ref 1.7–7.7)
Neutrophils Relative %: 66 %
Platelets: 154 10*3/uL (ref 150–400)
RBC: 2.91 MIL/uL — ABNORMAL LOW (ref 3.87–5.11)
RDW: 14.5 % (ref 11.5–15.5)
WBC: 5 10*3/uL (ref 4.0–10.5)
nRBC: 0 % (ref 0.0–0.2)

## 2020-06-06 LAB — BRAIN NATRIURETIC PEPTIDE: B Natriuretic Peptide: 1967 pg/mL — ABNORMAL HIGH (ref 0.0–100.0)

## 2020-06-06 LAB — TROPONIN I (HIGH SENSITIVITY)
Troponin I (High Sensitivity): 36 ng/L — ABNORMAL HIGH (ref <18)
Troponin I (High Sensitivity): 30 ng/L — ABNORMAL HIGH (ref <18)

## 2020-06-06 MED ORDER — DILTIAZEM HCL 25 MG/5ML IV SOLN: 10.0000 mg | Freq: Once | INTRAVENOUS | Status: DC

## 2020-06-06 MED ORDER — IOHEXOL 350 MG/ML SOLN
100.0000 mL | Freq: Once | INTRAVENOUS | Status: AC | PRN
  Administered 2020-06-06: 100 mL via INTRAVENOUS

## 2020-06-06 NOTE — ED Notes (Signed)
91% after ambulating from wheelchair to bed.

## 2020-06-06 NOTE — Discharge Instructions (Signed)
Wear your home O2 at 2L.

## 2020-06-06 NOTE — ED Triage Notes (Signed)
Pt here from home. Cc of shortness of breath for several days. 92% on room air in triage. Was here on the 21st for the same.  Is a dialysis patient. Tu,Thursday, Saturday.  Did 2 breathing treatments at home.  Also complaints of a headache all day. Took tylenol at 5pm.

## 2020-06-06 NOTE — ED Notes (Signed)
Phlebotomy at bedside for labs.

## 2020-06-06 NOTE — ED Provider Notes (Signed)
[facility] EMERGENCY DEPARTMENT Provider Note   CSN: [id number] Arrival date & time: 06/06/20  1928     History Chief Complaint  Patient presents with  . Shortness of Breath    Jody [name] is a 80 y.o. female.  Pt presents to the ED today with sob.  The pt said she's been sob for several days.  She was seen here on 4/21 for the same.  The work up then was unremarkable.  The pt is on dialysis and did go to dialysis yesterday.  She said that did not seem to help her sob.  The pt does have a hx of afib, but is not on blood thinners.  She said they caused too much bleeding.          Past Medical History:  Diagnosis Date  . Anemia in chronic kidney disease (CODE) 06/05/2015  . Arthritis    "bad in my legs" (12/07/2014)  . Chronic back pain    "dr said my spine is crooked"  . Chronic bronchitis ([place])    "get it q yr" (12/07/2014)  . CKD stage 4 due to type 2 diabetes mellitus ([place]) 06/05/2015  . Complication of anesthesia    headache for 2 days after surgery in May 2019  . COPD (chronic obstructive pulmonary disease) ([place])   . Gait difficulty 09/02/2014  . Hypercholesterolemia   . Hypertension   . Neuropathy 2015   in legs  . NSVT (nonsustained ventricular tachycardia) ([place])   . PAF (paroxysmal atrial fibrillation) ([place]) 06/05/2015  . Sinus brady-tachy syndrome ([place])   . Stroke ([place]) 05/2014   denies residual on 12/07/2014  . Type II diabetes mellitus [facility])     Patient Active Problem List   Diagnosis Date Noted  . Rectal bleeding 12/27/2019  . Acute GI bleeding 12/27/2019  . Nonsustained ventricular tachycardia ([place]) 10/27/2019  . Acute respiratory failure with hypoxia ([place]) 10/26/2019  . Megaloblastic anemia 10/26/2019  . Chronic blood loss anemia 10/26/2017  . Arthritis 10/26/2017  . Acute on chronic blood loss anemia 10/26/2017  . Iron deficiency anemia 02/23/2017  . Chronic diastolic CHF (congestive heart failure) ([place]) 10/15/2016  . Elevated troponin  10/15/2016  . Type 2 diabetes mellitus with nephropathy ([place]) 10/15/2016  . ESRD on dialysis ([place]) 10/15/2016  . NSTEMI (non-ST elevated myocardial infarction) ([place]) 10/03/2015  . Elevated LFTs 10/03/2015  . Encounter for therapeutic drug monitoring 07/29/2015  . COPD (chronic obstructive pulmonary disease) ([place]) 06/05/2015  . CKD stage 4 due to type 2 diabetes mellitus ([place]) 06/05/2015  . Anemia in chronic kidney disease, Stage IV 06/05/2015  . Acute on chronic diastolic CHF (congestive heart failure) ([place]) 06/05/2015  . Atrial fibrillation ([place]) 06/05/2015  . Atrial fibrillation with rapid ventricular response ([place]) 12/07/2014  . TIA (transient ischemic attack) 09/02/2014  . Gait difficulty 09/02/2014  . Other specified transient cerebral ischemias   . Stroke with cerebral ischemia ([place])   . Hemispheric carotid artery syndrome   . Essential hypertension   . Hyperlipidemia   . Type 2 diabetes mellitus with other circulatory complications ([place])   . Cerebral infarction ([place]) 05/27/2014    Past Surgical History:  Procedure Laterality Date  . ABDOMINAL HYSTERECTOMY  ~ 1970  . APPENDECTOMY  ~ 1970  . AV FISTULA PLACEMENT Right 07/06/2017   Procedure: RIGHT RADIOCEPHALIC ARTERIOVENOUS FISTULA creation;  Surgeon: Conrad [place], MD;  Location: [place];  Service: Vascular;  Laterality: Right;  . BASCILIC VEIN TRANSPOSITION Right 09/17/2017   Procedure:  SECOND STAGE BASILIC VEIN TRANSPOSITION RIGHT ARM;  Surgeon: Rosetta Posner, MD;  Location: [place];  Service: Vascular;  Laterality: Right;  . COLONOSCOPY N/A 10/28/2017   Procedure: COLONOSCOPY;  Surgeon: Rogene Houston, MD;  [facility];  Service: Endoscopy;  Laterality: N/A;  . COLONOSCOPY WITH PROPOFOL N/A 12/29/2019   Procedure: COLONOSCOPY WITH PROPOFOL;  Surgeon: Eloise Harman, DO;  [facility];  Service: Endoscopy;  Laterality: N/A;  . ESOPHAGOGASTRODUODENOSCOPY (EGD) WITH PROPOFOL N/A 10/31/2017   Procedure:  ESOPHAGOGASTRODUODENOSCOPY (EGD) WITH PROPOFOL;  Surgeon: Rogene Houston, MD;  [facility];  Service: Endoscopy;  Laterality: N/A;  . JOINT REPLACEMENT    . TOTAL KNEE ARTHROPLASTY Right ~ 1986  . TUMOR EXCISION  ~ 1970   "9# in my stomach"     OB History   No obstetric history on file.     Family History  Problem Relation Age of Onset  . Cancer Other   . Heart attack Brother   . Cancer Sister        breast  . Alcohol abuse Brother     Social History   Tobacco Use  . Smoking status: Former Smoker    Packs/day: 0.50    Years: 50.00    Pack years: 25.00    Types: Cigarettes    Start date: 03/07/1953    Quit date: 01/14/2004    Years since quitting: 16.4  . Smokeless tobacco: Never Used  . Tobacco comment: "quit smoking cigarettes  in ~ 2005"  Vaping Use  . Vaping Use: Never used  Substance Use Topics  . Alcohol use: No    Alcohol/week: 0.0 standard drinks    Comment: 10/242016 "quit drinking beer in ~ 1977"  . Drug use: No    Home Medications Prior to Admission medications   Medication Sig Start Date End Date Taking? Authorizing Provider  albuterol (ACCUNEB) 0.63 MG/3ML nebulizer solution Take 1 ampule by nebulization every 6 (six) hours as needed for wheezing.    [provider]  albuterol (PROVENTIL HFA;VENTOLIN HFA) 108 (90 BASE) MCG/ACT inhaler Inhale 1-2 puffs into the lungs every 6 (six) hours as needed for wheezing or shortness of breath. Reported on 03/01/2015    [provider]  apixaban (ELIQUIS) 2.5 MG TABS tablet Take 1 tablet (2.5 mg total) by mouth 2 (two) times daily. 01/01/20   Manuella Ghazi, Pratik D, DO  atorvastatin (LIPITOR) 40 MG tablet Take 1 tablet by mouth at bedtime.  01/22/16   [provider]  diltiazem (DILACOR XR) 180 MG 24 hr capsule Take 180 mg by mouth daily.    [provider]  fluticasone-salmeterol (ADVAIR HFA) 230-21 MCG/ACT inhaler Inhale 2 puffs into the lungs 2 (two) times daily.    [provider]  gabapentin (NEURONTIN) 100 MG capsule Take 100 mg by mouth 2 (two) times daily.  07/23/19   [provider]  magnesium hydroxide (MILK OF MAGNESIA) 400 MG/5ML suspension Take 15 mLs by mouth daily as needed for mild constipation.    [provider]  metoprolol tartrate (LOPRESSOR) 25 MG tablet Take 0.5 tablets (12.5 mg total) by mouth 2 (two) times daily. 10/18/16   Kathie Dike, MD  pantoprazole (PROTONIX) 40 MG tablet Take 1 tablet (40 mg total) by mouth daily. 12/31/19 12/30/20  Manuella Ghazi, Pratik D, DO  sevelamer carbonate (RENVELA) 800 MG tablet Take 800 mg by mouth 3 (three) times daily with meals.  07/22/19   [provider]  sodium bicarbonate 650 MG tablet Take 1 tablet (650 mg total) by mouth 2 (two) times daily. Patient not taking: Reported on 12/28/2019 10/17/16   TatShanon Brow, MD  TRESIBA FLEXTOUCH 100 UNIT/ML FlexTouch Pen Inject 12 Units into the skin daily in the afternoon.  07/23/19   [provider]    Allergies    Codeine  Review of Systems   Review of Systems  Respiratory: Positive for shortness of breath.   All other systems reviewed and are negative.   Physical Exam Updated Vital Signs BP (!) 158/95 (BP Location: Left Arm)   Pulse 84   Temp 98.5 F (36.9 C) (Oral)   Resp 17   Ht 5\' 7"  (1.702 m)   Wt 96.6 kg   SpO2 98%   BMI 33.36 kg/m   Physical Exam Vitals and nursing note reviewed.  Constitutional:      Appearance: She is well-developed.  HENT:     Head: Normocephalic and atraumatic.     Mouth/Throat:     Mouth: Mucous membranes are moist.     Pharynx: Oropharynx is clear.  Eyes:     Extraocular Movements: Extraocular movements intact.     Pupils: Pupils are equal, round, and reactive to light.  Cardiovascular:     Rate and Rhythm: Tachycardia present. Rhythm irregular.  Pulmonary:     Effort: Tachypnea present.  Abdominal:     General: Bowel sounds are normal.     Palpations: Abdomen is soft.   Musculoskeletal:        General: Normal range of motion.     Cervical back: Normal range of motion and neck supple.  Skin:    General: Skin is warm.     Capillary Refill: Capillary refill takes less than 2 seconds.  Neurological:     General: No focal deficit present.     Mental Status: She is alert and oriented to person, place, and time.  Psychiatric:        Mood and Affect: Mood normal.        Behavior: Behavior normal.     ED Results / Procedures / Treatments   Labs (all labs ordered are listed, but only abnormal results are displayed) Labs Reviewed  BASIC METABOLIC PANEL - Abnormal; Notable for the following components:      Result Value   Potassium 3.2 (*)    Chloride 94 (*)    BUN 31 (*)    Creatinine, Ser 4.84 (*)    GFR, Estimated 8 (*)    Anion gap 17 (*)    All other components within normal limits  BRAIN NATRIURETIC PEPTIDE - Abnormal; Notable for the following components:   B Natriuretic Peptide 1,967.0 (*)    All other components within normal limits  CBC WITH DIFFERENTIAL/PLATELET - Abnormal; Notable for the following components:   RBC 2.91 (*)    Hemoglobin 10.3 (*)    HCT 33.3 (*)    MCV 114.4 (*)    MCH 35.4 (*)    All other components within normal limits  TROPONIN I (HIGH SENSITIVITY) - Abnormal; Notable for the following components:   Troponin I (High Sensitivity) 36 (*)    All other components within normal limits  TROPONIN I (HIGH SENSITIVITY)    EKG EKG Interpretation  Date/Time:  Sunday June 06 2020 20:28:13 EDT Ventricular Rate:  100 PR Interval:    QRS Duration: 91 QT Interval:  353 QTC Calculation: 456 R Axis:   52 Text Interpretation: Atrial fibrillation Probable  anterior infarct, age indeterminate Baseline wander in lead(s) V1 No significant change since last tracing Confirmed by Isla Pence [id number]) on 06/06/2020 9:15:37 PM   Radiology CT Angio Chest PE W and/or Wo Contrast  Result Date: 06/06/2020 CLINICAL DATA:   Suspected pulmonary embolus with high probability. Decreased oxygen saturation. Shortness of breath for several days. EXAM: CT ANGIOGRAPHY CHEST WITH CONTRAST TECHNIQUE: Multidetector CT imaging of the chest was performed using the standard protocol during bolus administration of intravenous contrast. Multiplanar CT image reconstructions and MIPs were obtained to evaluate the vascular anatomy. CONTRAST:  190mL OMNIPAQUE IOHEXOL 350 MG/ML SOLN COMPARISON:  10/26/2019 FINDINGS: Cardiovascular: Good opacification of the central and segmental pulmonary arteries. No focal filling defects. No evidence of significant pulmonary embolus. Mild cardiac enlargement. No pericardial effusions. Normal caliber thoracic aorta with calcification. Mediastinum/Nodes: Esophagus is decompressed. No significant lymphadenopathy. Lungs/Pleura: Motion artifact limits examination. No focal consolidation or airspace disease identified. Bronchiectasis in the right lower lung medially. No pleural effusions. No pneumothorax. Upper Abdomen: No acute abnormalities demonstrated in the visualized upper abdomen. Musculoskeletal: Degenerative changes in the spine. No destructive bone lesions. Review of the MIP images confirms the above findings. IMPRESSION: 1. No evidence of significant pulmonary embolus. 2. No evidence of active pulmonary disease. 3. Bronchiectasis in the right lower lung medially. 4. Aortic atherosclerosis. Aortic Atherosclerosis (ICD10-I70.0). Electronically Signed   By: Lucienne Capers M.D.   On: 06/06/2020 22:45   DG Chest Port 1 View  Result Date: 06/06/2020 CLINICAL DATA:  Shortness of breath and weakness EXAM: PORTABLE CHEST 1 VIEW COMPARISON:  June 03, 2020 FINDINGS: Stable enlargement of the cardiac shadow. Aortic atherosclerosis. Similar mild vascular congestion without overt pulmonary edema. No focal consolidation. No pleural effusion. No pneumothorax. No acute osseous abnormality. IMPRESSION: Cardiomegaly and similar  mild vascular congestion without overt pulmonary edema similar to prior examinations. Electronically Signed   By: Dahlia Bailiff MD   On: 06/06/2020 21:08    Procedures Procedures   Medications Ordered in ED Medications  diltiazem (CARDIZEM) injection 10 mg (has no administration in time range)  iohexol (OMNIPAQUE) 350 MG/ML injection 100 mL (100 mLs Intravenous Contrast Given 06/06/20 2221)    ED Course  I have reviewed the triage vital signs and the nursing notes.  Pertinent labs & imaging results that were available during my care of the patient were reviewed by me and considered in my medical decision making (see chart for details).    MDM Rules/Calculators/A&P                          Pt had a CT scan which was negative for acute.  Work up is unremarkable for anything acute.    CHA2DS2/VAS Stroke Risk Points  Current as of 14 minutes ago     8 >= 2 Points: High Risk  1 - 1.99 Points: Medium Risk  0 Points: Low Risk    Last Change: N/A      Details    This score determines the patient's risk of having a stroke if the  patient has atrial fibrillation.       Points Metrics  1 Has Congestive Heart Failure:  Yes    Current as of 14 minutes ago  0 Has Vascular Disease:  No    Current as of 14 minutes ago  1 Has Hypertension:  Yes    Current as of 14 minutes ago  2 Age:  15    Current as  of 14 minutes ago  1 Has Diabetes:  Yes    Current as of 14 minutes ago  2 Had Stroke:  Yes  Had TIA:  Yes  Had Thromboembolism:  No    Current as of 14 minutes ago  1 Female:  Yes    Current as of 14 minutes ago     Pt's family member said pt is supposed to be on 2L oxygen all the time, but she has not been wearing it.  There is also some question about what meds pt is supposed to be on.  Pt's last meds she was d/c with are on the chart.  Pt is to review them with pcp.  Pt's sob is likely from not wearing her oxygen and her HR has been running a bit high.  Likely from not taking  her meds correctly.  Pt is stable for d/c.  Return if worse.     Final Clinical Impression(s) / ED Diagnoses Final diagnoses:  Chronic atrial fibrillation ([place])  ESRD on hemodialysis [facility])    Rx / DC Orders ED Discharge Orders    None       Isla Pence, MD 06/06/20 2333

## 2020-06-08 DIAGNOSIS — Z992 Dependence on renal dialysis: Secondary | ICD-10-CM | POA: Diagnosis not present

## 2020-06-08 DIAGNOSIS — N186 End stage renal disease: Secondary | ICD-10-CM | POA: Diagnosis not present

## 2020-06-10 DIAGNOSIS — E46 Unspecified protein-calorie malnutrition: Secondary | ICD-10-CM | POA: Diagnosis not present

## 2020-06-10 DIAGNOSIS — Z992 Dependence on renal dialysis: Secondary | ICD-10-CM | POA: Diagnosis not present

## 2020-06-10 DIAGNOSIS — N186 End stage renal disease: Secondary | ICD-10-CM | POA: Diagnosis not present

## 2020-06-12 DIAGNOSIS — J449 Chronic obstructive pulmonary disease, unspecified: Secondary | ICD-10-CM | POA: Diagnosis not present

## 2020-06-12 DIAGNOSIS — E1122 Type 2 diabetes mellitus with diabetic chronic kidney disease: Secondary | ICD-10-CM | POA: Diagnosis not present

## 2020-06-12 DIAGNOSIS — Z992 Dependence on renal dialysis: Secondary | ICD-10-CM | POA: Diagnosis not present

## 2020-06-12 DIAGNOSIS — N184 Chronic kidney disease, stage 4 (severe): Secondary | ICD-10-CM | POA: Diagnosis not present

## 2020-06-12 DIAGNOSIS — N186 End stage renal disease: Secondary | ICD-10-CM | POA: Diagnosis not present

## 2020-06-16 DIAGNOSIS — I110 Hypertensive heart disease with heart failure: Secondary | ICD-10-CM | POA: Diagnosis not present

## 2020-06-16 DIAGNOSIS — Z299 Encounter for prophylactic measures, unspecified: Secondary | ICD-10-CM | POA: Diagnosis not present

## 2020-06-16 DIAGNOSIS — E1165 Type 2 diabetes mellitus with hyperglycemia: Secondary | ICD-10-CM | POA: Diagnosis not present

## 2020-06-16 DIAGNOSIS — J9611 Chronic respiratory failure with hypoxia: Secondary | ICD-10-CM | POA: Diagnosis not present

## 2020-06-16 DIAGNOSIS — J449 Chronic obstructive pulmonary disease, unspecified: Secondary | ICD-10-CM | POA: Diagnosis not present

## 2020-06-16 DIAGNOSIS — I10 Essential (primary) hypertension: Secondary | ICD-10-CM | POA: Diagnosis not present

## 2020-06-17 DIAGNOSIS — E559 Vitamin D deficiency, unspecified: Secondary | ICD-10-CM | POA: Diagnosis not present

## 2020-06-17 DIAGNOSIS — Z992 Dependence on renal dialysis: Secondary | ICD-10-CM | POA: Diagnosis not present

## 2020-06-17 DIAGNOSIS — N186 End stage renal disease: Secondary | ICD-10-CM | POA: Diagnosis not present

## 2020-06-19 DIAGNOSIS — Z992 Dependence on renal dialysis: Secondary | ICD-10-CM | POA: Diagnosis not present

## 2020-06-19 DIAGNOSIS — N186 End stage renal disease: Secondary | ICD-10-CM | POA: Diagnosis not present

## 2020-06-22 DIAGNOSIS — Z992 Dependence on renal dialysis: Secondary | ICD-10-CM | POA: Diagnosis not present

## 2020-06-22 DIAGNOSIS — N186 End stage renal disease: Secondary | ICD-10-CM | POA: Diagnosis not present

## 2020-06-24 DIAGNOSIS — N186 End stage renal disease: Secondary | ICD-10-CM | POA: Diagnosis not present

## 2020-06-24 DIAGNOSIS — Z992 Dependence on renal dialysis: Secondary | ICD-10-CM | POA: Diagnosis not present

## 2020-06-26 DIAGNOSIS — N186 End stage renal disease: Secondary | ICD-10-CM | POA: Diagnosis not present

## 2020-06-26 DIAGNOSIS — Z992 Dependence on renal dialysis: Secondary | ICD-10-CM | POA: Diagnosis not present

## 2020-06-27 DIAGNOSIS — R6889 Other general symptoms and signs: Secondary | ICD-10-CM | POA: Diagnosis not present

## 2020-06-27 DIAGNOSIS — M6281 Muscle weakness (generalized): Secondary | ICD-10-CM | POA: Diagnosis not present

## 2020-06-27 DIAGNOSIS — I5032 Chronic diastolic (congestive) heart failure: Secondary | ICD-10-CM | POA: Diagnosis not present

## 2020-06-27 DIAGNOSIS — J9601 Acute respiratory failure with hypoxia: Secondary | ICD-10-CM | POA: Diagnosis not present

## 2020-06-29 DIAGNOSIS — Z992 Dependence on renal dialysis: Secondary | ICD-10-CM | POA: Diagnosis not present

## 2020-06-29 DIAGNOSIS — N186 End stage renal disease: Secondary | ICD-10-CM | POA: Diagnosis not present

## 2020-06-29 DIAGNOSIS — N2581 Secondary hyperparathyroidism of renal origin: Secondary | ICD-10-CM | POA: Diagnosis not present

## 2020-07-01 DIAGNOSIS — N186 End stage renal disease: Secondary | ICD-10-CM | POA: Diagnosis not present

## 2020-07-01 DIAGNOSIS — Z992 Dependence on renal dialysis: Secondary | ICD-10-CM | POA: Diagnosis not present

## 2020-07-03 DIAGNOSIS — N186 End stage renal disease: Secondary | ICD-10-CM | POA: Diagnosis not present

## 2020-07-03 DIAGNOSIS — Z992 Dependence on renal dialysis: Secondary | ICD-10-CM | POA: Diagnosis not present

## 2020-07-06 ENCOUNTER — Encounter: Payer: Self-pay | Admitting: Cardiology

## 2020-07-06 DIAGNOSIS — N186 End stage renal disease: Secondary | ICD-10-CM | POA: Diagnosis not present

## 2020-07-06 DIAGNOSIS — Z992 Dependence on renal dialysis: Secondary | ICD-10-CM | POA: Diagnosis not present

## 2020-07-06 NOTE — Progress Notes (Signed)
Cardiology Office Note   Date:  07/07/2020   ID:  Jody [name], DOB Jul 04, 1940, MRN [id number]  PCP:  Jody Blitz, MD  Cardiologist:   Jody Breeding, MD   Chief Complaint  Patient presents with  . Atrial Fibrillation      History of Present Illness: Jody [name] is a 80 y.o. female who presents for follow up of atrial fib, chronic diastolic HF and NSVT.  She was previously seen by Dr. Bronson [name].     She has been in the emergency room twice in April and I reviewed these records.  She has dialysis dependent renal failure.  She lives by herself but she has an aide that comes.  Her niece is her healthcare power of attorney and with her today.  It looks like she gets around slowly with a walker.  She has Meals on Wheels but she does not eat this and it sounds like she eats some prepackaged foods and is not particularly observant of low salt.  In the emergency room she was thought to have some COPD flare.  Chest x-ray showed some vascular congestion but no overt edema.  The last time CT did not demonstrate a pulmonary embolism.  She is in chronic atrial fibrillation and this was unchanged.  She also has home O2 which she had not been wearing at the time but she is starting to wear it more now.  She is in the office today without her oxygen.  She is not having any new distress.  She is not noticing her fibrillation.  She does not have presyncope or syncope.  She has some mild chronic lower extremity swelling.  It sounds like she tolerates dialysis Tuesdays Thursdays and Saturdays.     Past Medical History:  Diagnosis Date  . Anemia in chronic kidney disease (CODE) 06/05/2015  . Arthritis    "bad in my legs" (12/07/2014)  . Chronic atrial fibrillation ([place]) 06/05/2015  . Chronic back pain    "dr said my spine is crooked"  . Chronic bronchitis ([place])    "get it q yr" (12/07/2014)  . CKD stage 4 due to type 2 diabetes mellitus ([place]) 06/05/2015  . Complication of anesthesia     headache for 2 days after surgery in May 2019  . COPD (chronic obstructive pulmonary disease) ([place])   . Gait difficulty 09/02/2014  . Hypercholesterolemia   . Hypertension   . Neuropathy 2015   in legs  . NSVT (nonsustained ventricular tachycardia) ([place])   . Sinus brady-tachy syndrome ([place])   . Stroke ([place]) 05/2014   denies residual on 12/07/2014  . Type II diabetes mellitus ([place])     Past Surgical History:  Procedure Laterality Date  . ABDOMINAL HYSTERECTOMY  ~ 1970  . APPENDECTOMY  ~ 1970  . AV FISTULA PLACEMENT Right 07/06/2017   Procedure: RIGHT RADIOCEPHALIC ARTERIOVENOUS FISTULA creation;  Surgeon: Jody Eastland, MD;  Location: [place];  Service: Vascular;  Laterality: Right;  . BASCILIC VEIN TRANSPOSITION Right 09/17/2017   Procedure: SECOND STAGE BASILIC VEIN TRANSPOSITION RIGHT ARM;  Surgeon: Jody Posner, MD;  Location: [place];  Service: Vascular;  Laterality: Right;  . COLONOSCOPY N/A 10/28/2017   Procedure: COLONOSCOPY;  Surgeon: Jody Houston, MD;  [facility];  Service: Endoscopy;  Laterality: N/A;  . COLONOSCOPY WITH PROPOFOL N/A 12/29/2019   Procedure: COLONOSCOPY WITH PROPOFOL;  Surgeon: Jody Harman, DO;  [facility];  Service: Endoscopy;  Laterality: N/A;  .  ESOPHAGOGASTRODUODENOSCOPY (EGD) WITH PROPOFOL N/A 10/31/2017   Procedure: ESOPHAGOGASTRODUODENOSCOPY (EGD) WITH PROPOFOL;  Surgeon: Jody Houston, MD;  [facility];  Service: Endoscopy;  Laterality: N/A;  . JOINT REPLACEMENT    . TOTAL KNEE ARTHROPLASTY Right ~ 1986  . TUMOR EXCISION  ~ 1970   "9# in my stomach"     Current Outpatient Medications  Medication Sig Dispense Refill  . albuterol (ACCUNEB) 0.63 MG/3ML nebulizer solution Take 1 ampule by nebulization every 6 (six) hours as needed for wheezing.    [name] albuterol (PROVENTIL HFA;VENTOLIN HFA) 108 (90 BASE) MCG/ACT inhaler Inhale 1-2 puffs into the lungs every 6 (six) hours as needed for wheezing or shortness of  breath. Reported on 03/01/2015    . atorvastatin (LIPITOR) 40 MG tablet Take 1 tablet by mouth at bedtime.   0  . diltiazem (DILACOR XR) 180 MG 24 hr capsule Take 180 mg by mouth daily.    . fluticasone-salmeterol (ADVAIR HFA) 230-21 MCG/ACT inhaler Inhale 2 puffs into the lungs 2 (two) times daily.    . magnesium hydroxide (MILK OF MAGNESIA) 400 MG/5ML suspension Take 15 mLs by mouth daily as needed for mild constipation.    . metoprolol tartrate (LOPRESSOR) 25 MG tablet Take 0.5 tablets (12.5 mg total) by mouth 2 (two) times daily. 30 tablet 0  . pantoprazole (PROTONIX) 40 MG tablet Take 1 tablet (40 mg total) by mouth daily. 30 tablet 1  . sevelamer carbonate (RENVELA) 800 MG tablet Take 800 mg by mouth 3 (three) times daily with meals.     . sodium bicarbonate 650 MG tablet Take 1 tablet (650 mg total) by mouth 2 (two) times daily. 60 tablet 1  . TRESIBA FLEXTOUCH 100 UNIT/ML FlexTouch Pen Inject 12 Units into the skin daily in the afternoon.      No current facility-administered medications for this visit.    Allergies:   Codeine    ROS:  Please see the history of present illness.   Otherwise, review of systems are positive for none.   All other systems are reviewed and negative.    PHYSICAL EXAM: VS:  BP (!) 147/82   Pulse 71   Ht 5\' 7"  (1.702 m)   Wt 184 lb (83.5 kg)   BMI 28.82 kg/m  , BMI Body mass index is 28.82 kg/m. GENERAL:  Well appearing NECK:  No jugular venous distention, waveform within normal limits, carotid upstroke brisk and symmetric, no bruits, no thyromegaly LUNGS:  Clear to auscultation bilaterally CHEST:  Unremarkable HEART:  PMI not displaced or sustained,S1 and S2 within normal limits, no S3,  no clicks, no rubs, no murmurs, irregular  ABD:  Flat, positive bowel sounds normal in frequency in pitch, no bruits, no rebound, no guarding, no midline pulsatile mass, no hepatomegaly, no splenomegaly EXT:  2 plus pulses throughout, no edema, no cyanosis no  clubbing, dialysis fistula right upper arm with positive thrill and bruit   EKG:  EKG is not ordered today. The ekg ordered 06/06/2020 demonstrates atrial fibrillation, rate 100, left ventricular hypertrophy with repolarization changes,   Recent Labs: 12/27/2019: Magnesium 2.6 06/03/2020: ALT 21 06/06/2020: B Natriuretic Peptide 1,967.0; BUN 31; Creatinine, Ser 4.84; Hemoglobin 10.3; Platelets 154; Potassium 3.2; Sodium 136    Lipid Panel    Component Value Date/Time   CHOL 156 10/04/2015 0301   TRIG 51 10/04/2015 0301   HDL 54 10/04/2015 0301   CHOLHDL 2.9 10/04/2015 0301   VLDL 10 10/04/2015 0301   LDLCALC  92 10/04/2015 0301      Wt Readings from Last 3 Encounters:  07/07/20 184 lb (83.5 kg)  06/06/20 213 lb (96.6 kg)  06/03/20 213 lb (96.6 kg)     Other studies Reviewed: Additional studies/ records that were reviewed today include: ED records. Review of the above records demonstrates:  Please see elsewhere in the note.     ASSESSMENT AND PLAN:  Persistent atrial fibrillation:   I do not think she is symptomatic related to this.  She tolerates anticoagulation.  No change in therapy.  Acute diastolic heart failure:   Her volume is managed with dialysis.  However, she really needs to watch her salt and fluid intake and we talked about this at length today.  Because of her recent events and the fact that she has not had an echocardiogram in several years I will order an echocardiogram.  History of nonsustained ventricular tachycardia: She had no symptoms related to this.  No change in therapy.    Current medicines are reviewed at length with the patient today.  The patient does not have concerns regarding medicines.  The following changes have been made:  no change  Labs/ tests ordered today include:   Orders Placed This Encounter  Procedures  . ECHOCARDIOGRAM COMPLETE     Disposition:   FU with me in 6 months   Signed, Jody Breeding, MD  07/07/2020 1:40 PM      [facility]

## 2020-07-07 ENCOUNTER — Ambulatory Visit (INDEPENDENT_AMBULATORY_CARE_PROVIDER_SITE_OTHER): Payer: Medicare Other | Admitting: Cardiology

## 2020-07-07 ENCOUNTER — Other Ambulatory Visit: Payer: Self-pay

## 2020-07-07 ENCOUNTER — Telehealth: Payer: Self-pay

## 2020-07-07 ENCOUNTER — Encounter: Payer: Self-pay | Admitting: Cardiology

## 2020-07-07 VITALS — BP 147/82 | HR 71 | Ht 67.0 in | Wt 184.0 lb

## 2020-07-07 DIAGNOSIS — I472 Ventricular tachycardia: Secondary | ICD-10-CM

## 2020-07-07 DIAGNOSIS — I482 Chronic atrial fibrillation, unspecified: Secondary | ICD-10-CM | POA: Diagnosis not present

## 2020-07-07 DIAGNOSIS — I5032 Chronic diastolic (congestive) heart failure: Secondary | ICD-10-CM

## 2020-07-07 DIAGNOSIS — I5031 Acute diastolic (congestive) heart failure: Secondary | ICD-10-CM | POA: Diagnosis not present

## 2020-07-07 DIAGNOSIS — I4729 Other ventricular tachycardia: Secondary | ICD-10-CM

## 2020-07-07 NOTE — Telephone Encounter (Signed)
-----   Message from Shellia Cleverly, RN sent at 07/07/2020  1:42 PM EDT ----- Regarding: echo Pt has been ordered to have a 2 D Echo per [facility]. Pt prefers a M, W or Friday morning.  She has transportation then.  Thank you  Pam

## 2020-07-07 NOTE — Telephone Encounter (Signed)
No answer no vm , tried to reach pt to schedule echo  Message has already been sent to precert

## 2020-07-07 NOTE — Patient Instructions (Signed)
Medication Instructions:  The current medical regimen is effective;  continue present plan and medications.  *If you need a refill on your cardiac medications before your next appointment, please call your pharmacy*  Testing/Procedures: Your physician has requested that you have an echocardiogram. Echocardiography is a painless test that uses sound waves to create images of your heart. It provides your doctor with information about the size and shape of your heart and how well your heart's chambers and valves are working. This procedure takes approximately one hour. There are no restrictions for this procedure.  This will be completed at [facility].  You will be contacted to be scheduled.  Follow-Up: At [facility], you and your health needs are our priority.  As part of our continuing mission to provide you with exceptional heart care, we have created designated Provider Care Teams.  These Care Teams include your primary Cardiologist (physician) and Advanced Practice Providers (APPs -  Physician Assistants and Nurse Practitioners) who all work together to provide you with the care you need, when you need it.  We recommend signing up for the patient portal called "MyChart".  Sign up information is provided on this After Visit Summary.  MyChart is used to connect with patients for Virtual Visits (Telemedicine).  Patients are able to view lab/test results, encounter notes, upcoming appointments, etc.  Non-urgent messages can be sent to your provider as well.   To learn more about what you can do with MyChart, go to [email or web address].    Your next appointment:   6 month(s)  The format for your next appointment:   In Person  Provider:   Minus Breeding, MD   Thank you for choosing [facility]!!

## 2020-07-08 DIAGNOSIS — N186 End stage renal disease: Secondary | ICD-10-CM | POA: Diagnosis not present

## 2020-07-08 DIAGNOSIS — Z992 Dependence on renal dialysis: Secondary | ICD-10-CM | POA: Diagnosis not present

## 2020-07-10 DIAGNOSIS — Z992 Dependence on renal dialysis: Secondary | ICD-10-CM | POA: Diagnosis not present

## 2020-07-10 DIAGNOSIS — N186 End stage renal disease: Secondary | ICD-10-CM | POA: Diagnosis not present

## 2020-07-13 DIAGNOSIS — N186 End stage renal disease: Secondary | ICD-10-CM | POA: Diagnosis not present

## 2020-07-13 DIAGNOSIS — Z992 Dependence on renal dialysis: Secondary | ICD-10-CM | POA: Diagnosis not present

## 2020-07-15 DIAGNOSIS — N186 End stage renal disease: Secondary | ICD-10-CM | POA: Diagnosis not present

## 2020-07-15 DIAGNOSIS — Z992 Dependence on renal dialysis: Secondary | ICD-10-CM | POA: Diagnosis not present

## 2020-07-15 NOTE — Telephone Encounter (Signed)
No answer no vm tried to reach patient to schedule Echo

## 2020-07-17 DIAGNOSIS — Z992 Dependence on renal dialysis: Secondary | ICD-10-CM | POA: Diagnosis not present

## 2020-07-17 DIAGNOSIS — N186 End stage renal disease: Secondary | ICD-10-CM | POA: Diagnosis not present

## 2020-07-20 DIAGNOSIS — N186 End stage renal disease: Secondary | ICD-10-CM | POA: Diagnosis not present

## 2020-07-20 DIAGNOSIS — Z992 Dependence on renal dialysis: Secondary | ICD-10-CM | POA: Diagnosis not present

## 2020-07-20 NOTE — Telephone Encounter (Signed)
Noted thank you

## 2020-07-20 NOTE — Telephone Encounter (Signed)
No answer No vm . Line just rings I will mail letter to patient to get echo scheduled.

## 2020-07-22 DIAGNOSIS — N186 End stage renal disease: Secondary | ICD-10-CM | POA: Diagnosis not present

## 2020-07-22 DIAGNOSIS — Z992 Dependence on renal dialysis: Secondary | ICD-10-CM | POA: Diagnosis not present

## 2020-07-22 DIAGNOSIS — E559 Vitamin D deficiency, unspecified: Secondary | ICD-10-CM | POA: Diagnosis not present

## 2020-07-24 DIAGNOSIS — Z992 Dependence on renal dialysis: Secondary | ICD-10-CM | POA: Diagnosis not present

## 2020-07-24 DIAGNOSIS — N186 End stage renal disease: Secondary | ICD-10-CM | POA: Diagnosis not present

## 2020-07-27 DIAGNOSIS — Z992 Dependence on renal dialysis: Secondary | ICD-10-CM | POA: Diagnosis not present

## 2020-07-27 DIAGNOSIS — N186 End stage renal disease: Secondary | ICD-10-CM | POA: Diagnosis not present

## 2020-07-28 DIAGNOSIS — J9601 Acute respiratory failure with hypoxia: Secondary | ICD-10-CM | POA: Diagnosis not present

## 2020-07-28 DIAGNOSIS — I5032 Chronic diastolic (congestive) heart failure: Secondary | ICD-10-CM | POA: Diagnosis not present

## 2020-07-28 DIAGNOSIS — M6281 Muscle weakness (generalized): Secondary | ICD-10-CM | POA: Diagnosis not present

## 2020-07-28 DIAGNOSIS — R6889 Other general symptoms and signs: Secondary | ICD-10-CM | POA: Diagnosis not present

## 2020-07-29 DIAGNOSIS — Z992 Dependence on renal dialysis: Secondary | ICD-10-CM | POA: Diagnosis not present

## 2020-07-29 DIAGNOSIS — N186 End stage renal disease: Secondary | ICD-10-CM | POA: Diagnosis not present

## 2020-07-31 DIAGNOSIS — Z992 Dependence on renal dialysis: Secondary | ICD-10-CM | POA: Diagnosis not present

## 2020-07-31 DIAGNOSIS — N186 End stage renal disease: Secondary | ICD-10-CM | POA: Diagnosis not present

## 2020-08-02 ENCOUNTER — Ambulatory Visit (HOSPITAL_COMMUNITY)
Admission: RE | Admit: 2020-08-02 | Discharge: 2020-08-02 | Disposition: A | Discharge status: Home / Self Care | Payer: Medicare Other | Source: Ambulatory Visit | Attending: Cardiology | Admitting: Cardiology

## 2020-08-02 ENCOUNTER — Other Ambulatory Visit: Payer: Self-pay

## 2020-08-02 DIAGNOSIS — I482 Chronic atrial fibrillation, unspecified: Secondary | ICD-10-CM | POA: Diagnosis not present

## 2020-08-02 DIAGNOSIS — I5031 Acute diastolic (congestive) heart failure: Secondary | ICD-10-CM | POA: Diagnosis not present

## 2020-08-02 LAB — ECHOCARDIOGRAM COMPLETE
AR max vel: 0.9 cm2
AV Area VTI: 0.86 cm2
AV Area mean vel: 0.92 cm2
AV Mean grad: 6.7 mmHg
AV Peak grad: 12.1 mmHg
Ao pk vel: 1.74 m/s
Area-P 1/2: 3.61 cm2
Calc EF: 46.4 %
MV M vel: 5.13 m/s
MV Peak grad: 105.3 mmHg
P 1/2 time: 421 msec
S' Lateral: 4.2 cm
Single Plane A2C EF: 45.9 %
Single Plane A4C EF: 50.5 %

## 2020-08-02 NOTE — Progress Notes (Signed)
*  PRELIMINARY RESULTS* Echocardiogram 2D Echocardiogram has been performed.  Jody [name] 08/02/2020, 11:12 AM

## 2020-08-03 ENCOUNTER — Encounter: Payer: Self-pay | Admitting: *Deleted

## 2020-08-03 DIAGNOSIS — N186 End stage renal disease: Secondary | ICD-10-CM | POA: Diagnosis not present

## 2020-08-03 DIAGNOSIS — Z992 Dependence on renal dialysis: Secondary | ICD-10-CM | POA: Diagnosis not present

## 2020-08-03 NOTE — Telephone Encounter (Signed)
-----   Message from Minus Breeding, MD sent at 08/02/2020  5:57 PM EDT ----- The EF is mildly reduced.  I would like for her to have a [employer].   Call Ms. Guterrez with the results and send results to Monico Blitz, MD

## 2020-08-03 NOTE — Telephone Encounter (Signed)
This encounter was created in error - please disregard.

## 2020-08-05 ENCOUNTER — Telehealth: Payer: Self-pay | Admitting: *Deleted

## 2020-08-05 DIAGNOSIS — I429 Cardiomyopathy, unspecified: Secondary | ICD-10-CM

## 2020-08-05 DIAGNOSIS — Z992 Dependence on renal dialysis: Secondary | ICD-10-CM | POA: Diagnosis not present

## 2020-08-05 DIAGNOSIS — N186 End stage renal disease: Secondary | ICD-10-CM | POA: Diagnosis not present

## 2020-08-05 NOTE — Telephone Encounter (Signed)
-----   Message from Minus Breeding, MD sent at 08/02/2020  5:57 PM EDT ----- The EF is mildly reduced.  I would like for her to have a [employer].   Call Ms. Beggs with the results and send results to Monico Blitz, MD

## 2020-08-05 NOTE — Telephone Encounter (Signed)
pt aware of results  Order placed for lexiscan

## 2020-08-07 DIAGNOSIS — Z992 Dependence on renal dialysis: Secondary | ICD-10-CM | POA: Diagnosis not present

## 2020-08-07 DIAGNOSIS — N186 End stage renal disease: Secondary | ICD-10-CM | POA: Diagnosis not present

## 2020-08-09 DIAGNOSIS — R059 Cough, unspecified: Secondary | ICD-10-CM | POA: Diagnosis not present

## 2020-08-09 DIAGNOSIS — Z299 Encounter for prophylactic measures, unspecified: Secondary | ICD-10-CM | POA: Diagnosis not present

## 2020-08-09 DIAGNOSIS — I5033 Acute on chronic diastolic (congestive) heart failure: Secondary | ICD-10-CM | POA: Diagnosis not present

## 2020-08-09 DIAGNOSIS — E1165 Type 2 diabetes mellitus with hyperglycemia: Secondary | ICD-10-CM | POA: Diagnosis not present

## 2020-08-09 DIAGNOSIS — I509 Heart failure, unspecified: Secondary | ICD-10-CM | POA: Diagnosis not present

## 2020-08-09 DIAGNOSIS — R030 Elevated blood-pressure reading, without diagnosis of hypertension: Secondary | ICD-10-CM | POA: Diagnosis not present

## 2020-08-10 DIAGNOSIS — D509 Iron deficiency anemia, unspecified: Secondary | ICD-10-CM | POA: Diagnosis not present

## 2020-08-10 DIAGNOSIS — Z992 Dependence on renal dialysis: Secondary | ICD-10-CM | POA: Diagnosis not present

## 2020-08-10 DIAGNOSIS — N186 End stage renal disease: Secondary | ICD-10-CM | POA: Diagnosis not present

## 2020-08-11 ENCOUNTER — Encounter (HOSPITAL_COMMUNITY): Payer: Self-pay | Admitting: Cardiology

## 2020-08-12 DIAGNOSIS — N186 End stage renal disease: Secondary | ICD-10-CM | POA: Diagnosis not present

## 2020-08-12 DIAGNOSIS — Z992 Dependence on renal dialysis: Secondary | ICD-10-CM | POA: Diagnosis not present

## 2020-08-14 DIAGNOSIS — Z992 Dependence on renal dialysis: Secondary | ICD-10-CM | POA: Diagnosis not present

## 2020-08-14 DIAGNOSIS — N186 End stage renal disease: Secondary | ICD-10-CM | POA: Diagnosis not present

## 2020-08-17 DIAGNOSIS — N186 End stage renal disease: Secondary | ICD-10-CM | POA: Diagnosis not present

## 2020-08-17 DIAGNOSIS — Z992 Dependence on renal dialysis: Secondary | ICD-10-CM | POA: Diagnosis not present

## 2020-08-19 DIAGNOSIS — Z992 Dependence on renal dialysis: Secondary | ICD-10-CM | POA: Diagnosis not present

## 2020-08-19 DIAGNOSIS — N186 End stage renal disease: Secondary | ICD-10-CM | POA: Diagnosis not present

## 2020-08-21 DIAGNOSIS — Z992 Dependence on renal dialysis: Secondary | ICD-10-CM | POA: Diagnosis not present

## 2020-08-21 DIAGNOSIS — N186 End stage renal disease: Secondary | ICD-10-CM | POA: Diagnosis not present

## 2020-08-23 ENCOUNTER — Telehealth (HOSPITAL_COMMUNITY): Payer: Self-pay | Admitting: Cardiology

## 2020-08-23 NOTE — Telephone Encounter (Signed)
Just an FYI. We have made several attempts to contact this patient including sending a letter to schedule or reschedule their Myoview. We will be removing the patient from the echo/nuc WQ.   MAILED LETTER LBW 08/11/20  08/11/20 called  to schedule @ 2:37 and NA /LBW  06/28/22called to  schedule x 2 NA @ 10:14/LBW  08/05/20 [facility] to schedule @ 2:48/LBW     Thank you

## 2020-08-24 DIAGNOSIS — N186 End stage renal disease: Secondary | ICD-10-CM | POA: Diagnosis not present

## 2020-08-24 DIAGNOSIS — Z992 Dependence on renal dialysis: Secondary | ICD-10-CM | POA: Diagnosis not present

## 2020-08-26 DIAGNOSIS — Z992 Dependence on renal dialysis: Secondary | ICD-10-CM | POA: Diagnosis not present

## 2020-08-26 DIAGNOSIS — N186 End stage renal disease: Secondary | ICD-10-CM | POA: Diagnosis not present

## 2020-08-27 DIAGNOSIS — M6281 Muscle weakness (generalized): Secondary | ICD-10-CM | POA: Diagnosis not present

## 2020-08-27 DIAGNOSIS — I5032 Chronic diastolic (congestive) heart failure: Secondary | ICD-10-CM | POA: Diagnosis not present

## 2020-08-27 DIAGNOSIS — J9601 Acute respiratory failure with hypoxia: Secondary | ICD-10-CM | POA: Diagnosis not present

## 2020-08-27 DIAGNOSIS — R6889 Other general symptoms and signs: Secondary | ICD-10-CM | POA: Diagnosis not present

## 2020-08-28 DIAGNOSIS — N186 End stage renal disease: Secondary | ICD-10-CM | POA: Diagnosis not present

## 2020-08-28 DIAGNOSIS — Z992 Dependence on renal dialysis: Secondary | ICD-10-CM | POA: Diagnosis not present

## 2020-08-31 DIAGNOSIS — N2581 Secondary hyperparathyroidism of renal origin: Secondary | ICD-10-CM | POA: Diagnosis not present

## 2020-08-31 DIAGNOSIS — N186 End stage renal disease: Secondary | ICD-10-CM | POA: Diagnosis not present

## 2020-08-31 DIAGNOSIS — Z992 Dependence on renal dialysis: Secondary | ICD-10-CM | POA: Diagnosis not present

## 2020-09-02 DIAGNOSIS — N186 End stage renal disease: Secondary | ICD-10-CM | POA: Diagnosis not present

## 2020-09-02 DIAGNOSIS — Z992 Dependence on renal dialysis: Secondary | ICD-10-CM | POA: Diagnosis not present

## 2020-09-04 DIAGNOSIS — Z992 Dependence on renal dialysis: Secondary | ICD-10-CM | POA: Diagnosis not present

## 2020-09-04 DIAGNOSIS — N186 End stage renal disease: Secondary | ICD-10-CM | POA: Diagnosis not present

## 2020-09-07 DIAGNOSIS — Z992 Dependence on renal dialysis: Secondary | ICD-10-CM | POA: Diagnosis not present

## 2020-09-07 DIAGNOSIS — N186 End stage renal disease: Secondary | ICD-10-CM | POA: Diagnosis not present

## 2020-09-09 DIAGNOSIS — N186 End stage renal disease: Secondary | ICD-10-CM | POA: Diagnosis not present

## 2020-09-09 DIAGNOSIS — Z992 Dependence on renal dialysis: Secondary | ICD-10-CM | POA: Diagnosis not present

## 2020-09-11 DIAGNOSIS — N186 End stage renal disease: Secondary | ICD-10-CM | POA: Diagnosis not present

## 2020-09-11 DIAGNOSIS — Z992 Dependence on renal dialysis: Secondary | ICD-10-CM | POA: Diagnosis not present

## 2020-09-12 DIAGNOSIS — E1122 Type 2 diabetes mellitus with diabetic chronic kidney disease: Secondary | ICD-10-CM | POA: Diagnosis not present

## 2020-09-12 DIAGNOSIS — Z992 Dependence on renal dialysis: Secondary | ICD-10-CM | POA: Diagnosis not present

## 2020-09-12 DIAGNOSIS — J449 Chronic obstructive pulmonary disease, unspecified: Secondary | ICD-10-CM | POA: Diagnosis not present

## 2020-09-12 DIAGNOSIS — N186 End stage renal disease: Secondary | ICD-10-CM | POA: Diagnosis not present

## 2020-09-12 DIAGNOSIS — N184 Chronic kidney disease, stage 4 (severe): Secondary | ICD-10-CM | POA: Diagnosis not present

## 2020-09-14 DIAGNOSIS — Z992 Dependence on renal dialysis: Secondary | ICD-10-CM | POA: Diagnosis not present

## 2020-09-14 DIAGNOSIS — N186 End stage renal disease: Secondary | ICD-10-CM | POA: Diagnosis not present

## 2020-09-16 DIAGNOSIS — N186 End stage renal disease: Secondary | ICD-10-CM | POA: Diagnosis not present

## 2020-09-16 DIAGNOSIS — Z992 Dependence on renal dialysis: Secondary | ICD-10-CM | POA: Diagnosis not present

## 2020-09-18 DIAGNOSIS — N186 End stage renal disease: Secondary | ICD-10-CM | POA: Diagnosis not present

## 2020-09-18 DIAGNOSIS — Z992 Dependence on renal dialysis: Secondary | ICD-10-CM | POA: Diagnosis not present

## 2020-09-21 DIAGNOSIS — N186 End stage renal disease: Secondary | ICD-10-CM | POA: Diagnosis not present

## 2020-09-21 DIAGNOSIS — Z992 Dependence on renal dialysis: Secondary | ICD-10-CM | POA: Diagnosis not present

## 2020-09-22 ENCOUNTER — Encounter (HOSPITAL_COMMUNITY): Payer: Medicare Other

## 2020-09-23 DIAGNOSIS — Z992 Dependence on renal dialysis: Secondary | ICD-10-CM | POA: Diagnosis not present

## 2020-09-23 DIAGNOSIS — N186 End stage renal disease: Secondary | ICD-10-CM | POA: Diagnosis not present

## 2020-09-25 DIAGNOSIS — Z992 Dependence on renal dialysis: Secondary | ICD-10-CM | POA: Diagnosis not present

## 2020-09-25 DIAGNOSIS — N186 End stage renal disease: Secondary | ICD-10-CM | POA: Diagnosis not present

## 2020-09-27 DIAGNOSIS — J9601 Acute respiratory failure with hypoxia: Secondary | ICD-10-CM | POA: Diagnosis not present

## 2020-09-27 DIAGNOSIS — R6889 Other general symptoms and signs: Secondary | ICD-10-CM | POA: Diagnosis not present

## 2020-09-27 DIAGNOSIS — M6281 Muscle weakness (generalized): Secondary | ICD-10-CM | POA: Diagnosis not present

## 2020-09-27 DIAGNOSIS — I5032 Chronic diastolic (congestive) heart failure: Secondary | ICD-10-CM | POA: Diagnosis not present

## 2020-09-28 ENCOUNTER — Telehealth (HOSPITAL_COMMUNITY): Payer: Self-pay | Admitting: *Deleted

## 2020-09-28 DIAGNOSIS — N186 End stage renal disease: Secondary | ICD-10-CM | POA: Diagnosis not present

## 2020-09-28 DIAGNOSIS — Z992 Dependence on renal dialysis: Secondary | ICD-10-CM | POA: Diagnosis not present

## 2020-09-28 NOTE — Telephone Encounter (Signed)
Left message on voicemail per DPR in reference to upcoming appointment scheduled on 10/04/20 at 10:15 with detailed instructions given per Myocardial Perfusion Study Information Sheet for the test. LM to arrive 15 minutes early, and that it is imperative to arrive on time for appointment to keep from having the test rescheduled. If you need to cancel or reschedule your appointment, please call the office within 24 hours of your appointment. Failure to do so may result in a cancellation of your appointment, and a $50 no show fee. Phone number given for call back for any questions.

## 2020-09-30 DIAGNOSIS — Z992 Dependence on renal dialysis: Secondary | ICD-10-CM | POA: Diagnosis not present

## 2020-09-30 DIAGNOSIS — N186 End stage renal disease: Secondary | ICD-10-CM | POA: Diagnosis not present

## 2020-10-02 DIAGNOSIS — N186 End stage renal disease: Secondary | ICD-10-CM | POA: Diagnosis not present

## 2020-10-02 DIAGNOSIS — Z992 Dependence on renal dialysis: Secondary | ICD-10-CM | POA: Diagnosis not present

## 2020-10-04 ENCOUNTER — Other Ambulatory Visit: Payer: Self-pay

## 2020-10-04 ENCOUNTER — Ambulatory Visit (HOSPITAL_COMMUNITY): Payer: Medicare Other | Attending: Cardiology

## 2020-10-04 DIAGNOSIS — I429 Cardiomyopathy, unspecified: Secondary | ICD-10-CM | POA: Diagnosis not present

## 2020-10-04 LAB — MYOCARDIAL PERFUSION IMAGING
Base ST Depression (mm): 0 mm
LV dias vol: 126 mL (ref 46–106)
LV sys vol: 87 mL
MPHR: 138 {beats}/min
Nuc Stress EF: 31 %
Peak HR: 103 {beats}/min
Percent HR: 74.6 %
Rest HR: 85 {beats}/min
Rest Nuclear Isotope Dose: 10.9 mCi
SRS: 0
SSS: 0
ST Depression (mm): 0 mm
Stress Nuclear Isotope Dose: 31.1 mCi
TID: 1.06

## 2020-10-04 MED ORDER — TECHNETIUM TC 99M TETROFOSMIN IV KIT
10.2000 | PACK | Freq: Once | INTRAVENOUS | Status: AC | PRN
  Administered 2020-10-04: 10.9 via INTRAVENOUS
  Filled 2020-10-04: qty 11

## 2020-10-04 MED ORDER — REGADENOSON 0.4 MG/5ML IV SOLN
0.4000 mg | Freq: Once | INTRAVENOUS | Status: AC
Start: 2020-10-04 — End: 2020-10-04
  Administered 2020-10-04: 0.4 mg via INTRAVENOUS

## 2020-10-04 MED ORDER — TECHNETIUM TC 99M TETROFOSMIN IV KIT
31.1000 | PACK | Freq: Once | INTRAVENOUS | Status: AC | PRN
  Administered 2020-10-04: 31.1 via INTRAVENOUS
  Filled 2020-10-04: qty 32

## 2020-10-04 MED ORDER — AMINOPHYLLINE 25 MG/ML IV SOLN
75.0000 mg | Freq: Once | INTRAVENOUS | Status: AC
  Administered 2020-10-04: 75 mg via INTRAVENOUS

## 2020-10-05 DIAGNOSIS — Z992 Dependence on renal dialysis: Secondary | ICD-10-CM | POA: Diagnosis not present

## 2020-10-05 DIAGNOSIS — N186 End stage renal disease: Secondary | ICD-10-CM | POA: Diagnosis not present

## 2020-10-07 DIAGNOSIS — N186 End stage renal disease: Secondary | ICD-10-CM | POA: Diagnosis not present

## 2020-10-07 DIAGNOSIS — Z992 Dependence on renal dialysis: Secondary | ICD-10-CM | POA: Diagnosis not present

## 2020-10-09 DIAGNOSIS — N186 End stage renal disease: Secondary | ICD-10-CM | POA: Diagnosis not present

## 2020-10-09 DIAGNOSIS — Z992 Dependence on renal dialysis: Secondary | ICD-10-CM | POA: Diagnosis not present

## 2020-10-12 DIAGNOSIS — N2581 Secondary hyperparathyroidism of renal origin: Secondary | ICD-10-CM | POA: Diagnosis not present

## 2020-10-12 DIAGNOSIS — N186 End stage renal disease: Secondary | ICD-10-CM | POA: Diagnosis not present

## 2020-10-12 DIAGNOSIS — Z992 Dependence on renal dialysis: Secondary | ICD-10-CM | POA: Diagnosis not present

## 2020-10-13 DIAGNOSIS — N186 End stage renal disease: Secondary | ICD-10-CM | POA: Diagnosis not present

## 2020-10-13 DIAGNOSIS — Z992 Dependence on renal dialysis: Secondary | ICD-10-CM | POA: Diagnosis not present

## 2020-10-14 DIAGNOSIS — N186 End stage renal disease: Secondary | ICD-10-CM | POA: Diagnosis not present

## 2020-10-14 DIAGNOSIS — Z992 Dependence on renal dialysis: Secondary | ICD-10-CM | POA: Diagnosis not present

## 2020-10-16 DIAGNOSIS — N186 End stage renal disease: Secondary | ICD-10-CM | POA: Diagnosis not present

## 2020-10-16 DIAGNOSIS — Z992 Dependence on renal dialysis: Secondary | ICD-10-CM | POA: Diagnosis not present

## 2020-10-19 DIAGNOSIS — Z992 Dependence on renal dialysis: Secondary | ICD-10-CM | POA: Diagnosis not present

## 2020-10-19 DIAGNOSIS — N186 End stage renal disease: Secondary | ICD-10-CM | POA: Diagnosis not present

## 2020-10-21 DIAGNOSIS — Z992 Dependence on renal dialysis: Secondary | ICD-10-CM | POA: Diagnosis not present

## 2020-10-21 DIAGNOSIS — N186 End stage renal disease: Secondary | ICD-10-CM | POA: Diagnosis not present

## 2020-10-23 DIAGNOSIS — Z992 Dependence on renal dialysis: Secondary | ICD-10-CM | POA: Diagnosis not present

## 2020-10-23 DIAGNOSIS — N186 End stage renal disease: Secondary | ICD-10-CM | POA: Diagnosis not present

## 2020-10-26 DIAGNOSIS — N186 End stage renal disease: Secondary | ICD-10-CM | POA: Diagnosis not present

## 2020-10-26 DIAGNOSIS — Z992 Dependence on renal dialysis: Secondary | ICD-10-CM | POA: Diagnosis not present

## 2020-10-27 ENCOUNTER — Other Ambulatory Visit: Payer: Self-pay

## 2020-10-27 ENCOUNTER — Encounter (HOSPITAL_COMMUNITY): Payer: Self-pay | Admitting: Emergency Medicine

## 2020-10-27 ENCOUNTER — Emergency Department (HOSPITAL_COMMUNITY)
Admission: EM | Admit: 2020-10-27 | Discharge: 2020-10-27 | Disposition: A | Discharge status: Home / Self Care | Payer: Medicare Other | Attending: Emergency Medicine | Admitting: Emergency Medicine

## 2020-10-27 ENCOUNTER — Emergency Department (HOSPITAL_COMMUNITY): Payer: Medicare Other

## 2020-10-27 DIAGNOSIS — M25532 Pain in left wrist: Secondary | ICD-10-CM | POA: Insufficient documentation

## 2020-10-27 DIAGNOSIS — Z794 Long term (current) use of insulin: Secondary | ICD-10-CM | POA: Insufficient documentation

## 2020-10-27 DIAGNOSIS — Z20822 Contact with and (suspected) exposure to covid-19: Secondary | ICD-10-CM | POA: Insufficient documentation

## 2020-10-27 DIAGNOSIS — E1122 Type 2 diabetes mellitus with diabetic chronic kidney disease: Secondary | ICD-10-CM | POA: Diagnosis not present

## 2020-10-27 DIAGNOSIS — I130 Hypertensive heart and chronic kidney disease with heart failure and stage 1 through stage 4 chronic kidney disease, or unspecified chronic kidney disease: Secondary | ICD-10-CM | POA: Diagnosis not present

## 2020-10-27 DIAGNOSIS — I517 Cardiomegaly: Secondary | ICD-10-CM | POA: Diagnosis not present

## 2020-10-27 DIAGNOSIS — M1812 Unilateral primary osteoarthritis of first carpometacarpal joint, left hand: Secondary | ICD-10-CM | POA: Diagnosis not present

## 2020-10-27 DIAGNOSIS — R059 Cough, unspecified: Secondary | ICD-10-CM | POA: Diagnosis not present

## 2020-10-27 DIAGNOSIS — N184 Chronic kidney disease, stage 4 (severe): Secondary | ICD-10-CM | POA: Diagnosis not present

## 2020-10-27 DIAGNOSIS — R0602 Shortness of breath: Secondary | ICD-10-CM | POA: Insufficient documentation

## 2020-10-27 DIAGNOSIS — Z87891 Personal history of nicotine dependence: Secondary | ICD-10-CM | POA: Diagnosis not present

## 2020-10-27 DIAGNOSIS — I5032 Chronic diastolic (congestive) heart failure: Secondary | ICD-10-CM | POA: Diagnosis not present

## 2020-10-27 DIAGNOSIS — J811 Chronic pulmonary edema: Secondary | ICD-10-CM | POA: Diagnosis not present

## 2020-10-27 LAB — BASIC METABOLIC PANEL
Anion gap: 14 (ref 5–15)
BUN: 45 mg/dL — ABNORMAL HIGH (ref 8–23)
CO2: 24 mmol/L (ref 22–32)
Calcium: 9 mg/dL (ref 8.9–10.3)
Chloride: 96 mmol/L — ABNORMAL LOW (ref 98–111)
Creatinine, Ser: 6.66 mg/dL — ABNORMAL HIGH (ref 0.44–1.00)
GFR, Estimated: 6 mL/min — ABNORMAL LOW (ref >60)
Glucose, Bld: 125 mg/dL — ABNORMAL HIGH (ref 70–99)
Potassium: 3.9 mmol/L (ref 3.5–5.1)
Sodium: 134 mmol/L — ABNORMAL LOW (ref 135–145)

## 2020-10-27 LAB — CBC WITH DIFFERENTIAL/PLATELET
Abs Immature Granulocytes: 0.02 10*3/uL (ref 0.00–0.07)
Basophils Absolute: 0 10*3/uL (ref 0.0–0.1)
Basophils Relative: 1 %
Eosinophils Absolute: 0.3 10*3/uL (ref 0.0–0.5)
Eosinophils Relative: 4 %
HCT: 29.8 % — ABNORMAL LOW (ref 36.0–46.0)
Hemoglobin: 9.3 g/dL — ABNORMAL LOW (ref 12.0–15.0)
Immature Granulocytes: 0 %
Lymphocytes Relative: 9 %
Lymphs Abs: 0.6 10*3/uL — ABNORMAL LOW (ref 0.7–4.0)
MCH: 35.5 pg — ABNORMAL HIGH (ref 26.0–34.0)
MCHC: 31.2 g/dL (ref 30.0–36.0)
MCV: 113.7 fL — ABNORMAL HIGH (ref 80.0–100.0)
Monocytes Absolute: 0.4 10*3/uL (ref 0.1–1.0)
Monocytes Relative: 6 %
Neutro Abs: 5.1 10*3/uL (ref 1.7–7.7)
Neutrophils Relative %: 80 %
Platelets: 159 10*3/uL (ref 150–400)
RBC: 2.62 MIL/uL — ABNORMAL LOW (ref 3.87–5.11)
RDW: 14.5 % (ref 11.5–15.5)
WBC: 6.4 10*3/uL (ref 4.0–10.5)
nRBC: 0 % (ref 0.0–0.2)

## 2020-10-27 LAB — TROPONIN I (HIGH SENSITIVITY): Troponin I (High Sensitivity): 31 ng/L — ABNORMAL HIGH (ref <18)

## 2020-10-27 LAB — RESP PANEL BY RT-PCR (FLU A&B, COVID) ARPGX2
Influenza A by PCR: NEGATIVE
Influenza B by PCR: NEGATIVE
SARS Coronavirus 2 by RT PCR: NEGATIVE

## 2020-10-27 LAB — LACTIC ACID, PLASMA: Lactic Acid, Venous: 1 mmol/L (ref 0.5–1.9)

## 2020-10-27 LAB — BRAIN NATRIURETIC PEPTIDE: B Natriuretic Peptide: 1496 pg/mL — ABNORMAL HIGH (ref 0.0–100.0)

## 2020-10-27 MED ORDER — MORPHINE SULFATE (PF) 2 MG/ML IV SOLN
2.0000 mg | Freq: Once | INTRAVENOUS | Status: AC
  Administered 2020-10-27: 2 mg via INTRAVENOUS
  Filled 2020-10-27: qty 1

## 2020-10-27 NOTE — ED Triage Notes (Signed)
Pt c/o sob and cough. Pt had dialysis yesterday. Pt was 82% on room air but did not wear o2 on the ride to the hospital.

## 2020-10-27 NOTE — ED Provider Notes (Signed)
[facility] EMERGENCY DEPARTMENT Provider Note   CSN: [id number] Arrival date & time: 10/27/20  2050     History Chief Complaint  Patient presents with   Shortness of Breath    Jody [name] is a 80 y.o. female.  Patient presents with complaint of continued cough, SOB and left wrist pain.  She states she "always" has a cough, without change recently, but noticed increased SOB today.  Denies fevers, no chest pain.  Also noted worsening wrist pain today.  Left wrist, described as sharp persistent pain.  No reports of fall or trauma.      Past Medical History:  Diagnosis Date   Anemia in chronic kidney disease (CODE) 06/05/2015   Arthritis    "bad in my legs" (12/07/2014)   Chronic atrial fibrillation ([place]) 06/05/2015   Chronic back pain    "dr said my spine is crooked"   Chronic bronchitis ([place])    "get it q yr" (12/07/2014)   CKD stage 4 due to type 2 diabetes mellitus ([place]) 6/44/0347   Complication of anesthesia    headache for 2 days after surgery in May 2019   COPD (chronic obstructive pulmonary disease) ([place])    Gait difficulty 09/02/2014   Hypercholesterolemia    Hypertension    Neuropathy 2015   in legs   NSVT (nonsustained ventricular tachycardia) ([place])    Sinus brady-tachy syndrome ([place])    Stroke ([place]) 05/2014   denies residual on 12/07/2014   Type II diabetes mellitus ([place])     Patient Active Problem List   Diagnosis Date Noted   Rectal bleeding 12/27/2019   Acute GI bleeding 12/27/2019   NSVT (nonsustained ventricular tachycardia) ([place]) 10/27/2019   Acute respiratory failure with hypoxia (HCC) 10/26/2019   Megaloblastic anemia 10/26/2019   Chronic blood loss anemia 10/26/2017   Arthritis 10/26/2017   Acute on chronic blood loss anemia 10/26/2017   Iron deficiency anemia 02/23/2017   Chronic diastolic CHF (congestive heart failure) ([place]) 10/15/2016   Elevated troponin 10/15/2016   Type 2 diabetes mellitus with nephropathy ([place]) 10/15/2016   ESRD on  dialysis ([place]) 10/15/2016   NSTEMI (non-ST elevated myocardial infarction) ([place]) 10/03/2015   Elevated LFTs 10/03/2015   Encounter for therapeutic drug monitoring 07/29/2015   COPD (chronic obstructive pulmonary disease) ([place]) 06/05/2015   CKD stage 4 due to type 2 diabetes mellitus ([place]) 06/05/2015   Anemia in chronic kidney disease, Stage IV 42/59/5638   Acute diastolic HF (heart failure) ([place]) 06/05/2015   Atrial fibrillation ([place]) 06/05/2015   Atrial fibrillation with rapid ventricular response ([place]) 12/07/2014   TIA (transient ischemic attack) 09/02/2014   Gait difficulty 09/02/2014   Other specified transient cerebral ischemias    Stroke with cerebral ischemia [facility])    Hemispheric carotid artery syndrome    Essential hypertension    Hyperlipidemia    Type 2 diabetes mellitus with other circulatory complications ([place])    Cerebral infarction ([place]) 05/27/2014    Past Surgical History:  Procedure Laterality Date   ABDOMINAL HYSTERECTOMY  ~ 1970   APPENDECTOMY  ~ [place] Right 07/06/2017   Procedure: RIGHT RADIOCEPHALIC ARTERIOVENOUS FISTULA creation;  Surgeon: Conrad [place], MD;  Location: [place];  Service: Vascular;  Laterality: Right;   [place] Right 09/17/2017   Procedure: SECOND STAGE BASILIC VEIN TRANSPOSITION RIGHT ARM;  Surgeon: Rosetta Posner, MD;  Location: [place];  Service: Vascular;  Laterality: Right;   COLONOSCOPY N/A 10/28/2017   Procedure: COLONOSCOPY;  Surgeon: Rogene Houston, MD;  [facility];  Service: Endoscopy;  Laterality: N/A;   COLONOSCOPY WITH PROPOFOL N/A 12/29/2019   Procedure: COLONOSCOPY WITH PROPOFOL;  Surgeon: Eloise Harman, DO;  [facility];  Service: Endoscopy;  Laterality: N/A;   ESOPHAGOGASTRODUODENOSCOPY (EGD) WITH PROPOFOL N/A 10/31/2017   Procedure: ESOPHAGOGASTRODUODENOSCOPY (EGD) WITH PROPOFOL;  Surgeon: Rogene Houston, MD;  [facility];  Service: Endoscopy;   Laterality: N/A;   JOINT REPLACEMENT     TOTAL KNEE ARTHROPLASTY Right ~ [place]  ~ 1970   "9# in my stomach"     OB History   No obstetric history on file.     Family History  Problem Relation Age of Onset   Cancer Other    Heart attack Brother    Cancer Sister        breast   Alcohol abuse Brother     Social History   Tobacco Use   Smoking status: Former    Packs/day: 0.50    Years: 50.00    Pack years: 25.00    Types: Cigarettes    Start date: 03/07/1953    Quit date: 01/14/2004    Years since quitting: 16.7   Smokeless tobacco: Never   Tobacco comments:    "quit smoking cigarettes  in ~ 2005"  Vaping Use   Vaping Use: Never used  Substance Use Topics   Alcohol use: No    Alcohol/week: 0.0 standard drinks    Comment: 10/242016 "quit drinking beer in ~ 1977"   Drug use: No    Home Medications Prior to Admission medications   Medication Sig Start Date End Date Taking? Authorizing Provider  albuterol (ACCUNEB) 0.63 MG/3ML nebulizer solution Take 1 ampule by nebulization every 6 (six) hours as needed for wheezing.    [provider]  albuterol (PROVENTIL HFA;VENTOLIN HFA) 108 (90 BASE) MCG/ACT inhaler Inhale 1-2 puffs into the lungs every 6 (six) hours as needed for wheezing or shortness of breath. Reported on 03/01/2015    [provider]  atorvastatin (LIPITOR) 40 MG tablet Take 1 tablet by mouth at bedtime.  01/22/16   [provider]  diltiazem (DILACOR XR) 180 MG 24 hr capsule Take 180 mg by mouth daily.    [provider]  fluticasone-salmeterol (ADVAIR HFA) 230-21 MCG/ACT inhaler Inhale 2 puffs into the lungs 2 (two) times daily.    [provider]  magnesium hydroxide (MILK OF MAGNESIA) 400 MG/5ML suspension Take 15 mLs by mouth daily as needed for mild constipation.    [provider]  metoprolol tartrate (LOPRESSOR) 25 MG tablet Take 0.5 tablets (12.5 mg total) by mouth 2 (two) times daily.  10/18/16   Kathie Dike, MD  pantoprazole (PROTONIX) 40 MG tablet Take 1 tablet (40 mg total) by mouth daily. 12/31/19 12/30/20  Manuella Ghazi, Pratik D, DO  sevelamer carbonate (RENVELA) 800 MG tablet Take 800 mg by mouth 3 (three) times daily with meals.  07/22/19   [provider]  sodium bicarbonate 650 MG tablet Take 1 tablet (650 mg total) by mouth 2 (two) times daily. 10/17/16   Orson Eva, MD  TRESIBA FLEXTOUCH 100 UNIT/ML FlexTouch Pen Inject 12 Units into the skin daily in the afternoon.  07/23/19   [provider]    Allergies    Codeine  Review of Systems   Review of Systems  Constitutional:  Negative for fever.  HENT:  Negative for ear pain.  Eyes:  Negative for pain.  Respiratory:  Positive for cough and shortness of breath.   Cardiovascular:  Negative for chest pain.  Gastrointestinal:  Negative for abdominal pain.  Genitourinary:  Negative for flank pain.  Musculoskeletal:  Negative for back pain.  Skin:  Negative for rash.  Neurological:  Negative for headaches.   Physical Exam Updated Vital Signs BP 127/72 (BP Location: Left Arm)   Pulse 75   Temp 98.6 F (37 C)   Resp 19   Ht 5\' 7"  (1.702 m)   Wt 84 kg   SpO2 100%   BMI 29.00 kg/m   Physical Exam Constitutional:      General: She is not in acute distress.    Appearance: Normal appearance.  HENT:     Head: Normocephalic.     Nose: Nose normal.  Eyes:     Extraocular Movements: Extraocular movements intact.  Cardiovascular:     Rate and Rhythm: Normal rate.  Pulmonary:     Effort: Pulmonary effort is normal.     Breath sounds: No decreased breath sounds, wheezing or rhonchi.  Musculoskeletal:     Cervical back: Normal range of motion.     Comments: TTP along left wrist, mild swelling present.  No erythema, no abnormal warmth.  NVI otherwise, compartments soft.   Neurological:     General: No focal deficit present.     Mental Status: She is alert. Mental status is at baseline.    ED  Results / Procedures / Treatments   Labs (all labs ordered are listed, but only abnormal results are displayed) Labs Reviewed  CBC WITH DIFFERENTIAL/PLATELET - Abnormal; Notable for the following components:      Result Value   RBC 2.62 (*)    Hemoglobin 9.3 (*)    HCT 29.8 (*)    MCV 113.7 (*)    MCH 35.5 (*)    Lymphs Abs 0.6 (*)    All other components within normal limits  BASIC METABOLIC PANEL - Abnormal; Notable for the following components:   Sodium 134 (*)    Chloride 96 (*)    Glucose, Bld 125 (*)    BUN 45 (*)    Creatinine, Ser 6.66 (*)    GFR, Estimated 6 (*)    All other components within normal limits  BRAIN NATRIURETIC PEPTIDE - Abnormal; Notable for the following components:   B Natriuretic Peptide 1,496.0 (*)    All other components within normal limits  TROPONIN I (HIGH SENSITIVITY) - Abnormal; Notable for the following components:   Troponin I (High Sensitivity) 31 (*)    All other components within normal limits  CULTURE, BLOOD (ROUTINE X 2)  CULTURE, BLOOD (ROUTINE X 2)  RESP PANEL BY RT-PCR (FLU A&B, COVID) ARPGX2  LACTIC ACID, PLASMA  LACTIC ACID, PLASMA    EKG None  Radiology DG Wrist Complete Left  Result Date: 10/27/2020 CLINICAL DATA:  Pain EXAM: LEFT WRIST - COMPLETE 3+ VIEW COMPARISON:  None. FINDINGS: No evidence of fracture or dislocation. Advanced degenerative changes of the first [facility] joint with subluxation, joint space loss and marked osteophyte formation. Mild degenerative changes of the triscaphe joint and first MCP joint. Diffuse demineralization. Vascular calcifications. IMPRESSION: Advanced degenerative changes of the first [facility] joint. Electronically Signed   By: Yetta Glassman M.D.   On: 10/27/2020 21:42   DG Chest Port 1 View  Result Date: 10/27/2020 CLINICAL DATA:  Cough and shortness of breath. EXAM: PORTABLE CHEST 1 VIEW COMPARISON:  CT  a chest in chest x-ray dated June 06, 2020. FINDINGS: Stable cardiomegaly and chronic  pulmonary vascular congestion. No focal consolidation, pleural effusion, or pneumothorax. No acute osseous abnormality. IMPRESSION: 1. No active disease.  Chronic pulmonary vascular congestion. Electronically Signed   By: Titus Dubin M.D.   On: 10/27/2020 21:41    Procedures Procedures   Medications Ordered in ED Medications  morphine 2 MG/ML injection 2 mg (2 mg Intravenous Given 10/27/20 2202)    ED Course  I have reviewed the triage vital signs and the nursing notes.  Pertinent labs & imaging results that were available during my care of the patient were reviewed by me and considered in my medical decision making (see chart for details).    MDM Rules/Calculators/A&P                           Chest xray was normal, unchanged, left wrist xray showing no fracture.  Severe arthritic changes present.  Labs show normal WBC, normal lactic, and uncahnged troponin.  Symptoms of SOB resolved on O2 support at 3.5 L, similar to baseline requirements at home for this patient.  Advised her to complete her dialysis tomorrow as scheduled.  Continue home O2, and follow up with PCP regarding wrist pain 3-4 days.  Advised return for fevers, worsening SOB, pain or any other concerns.  Final Clinical Impression(s) / ED Diagnoses Final diagnoses:  SOB (shortness of breath)  Left wrist pain    Rx / DC Orders ED Discharge Orders     None        Luna Fuse, MD 10/27/20 2308

## 2020-10-27 NOTE — Discharge Instructions (Signed)
Call your primary care doctor or specialist as discussed in the next 2-3 days.   Return immediately back to the ER if:  Your symptoms worsen within the next 12-24 hours. You develop new symptoms such as new fevers, persistent vomiting, new pain, shortness of breath, or new weakness or numbness, or if you have any other concerns.  

## 2020-10-28 DIAGNOSIS — N186 End stage renal disease: Secondary | ICD-10-CM | POA: Diagnosis not present

## 2020-10-28 DIAGNOSIS — J9601 Acute respiratory failure with hypoxia: Secondary | ICD-10-CM | POA: Diagnosis not present

## 2020-10-28 DIAGNOSIS — M6281 Muscle weakness (generalized): Secondary | ICD-10-CM | POA: Diagnosis not present

## 2020-10-28 DIAGNOSIS — I5032 Chronic diastolic (congestive) heart failure: Secondary | ICD-10-CM | POA: Diagnosis not present

## 2020-10-28 DIAGNOSIS — Z992 Dependence on renal dialysis: Secondary | ICD-10-CM | POA: Diagnosis not present

## 2020-10-28 DIAGNOSIS — R6889 Other general symptoms and signs: Secondary | ICD-10-CM | POA: Diagnosis not present

## 2020-10-30 DIAGNOSIS — N186 End stage renal disease: Secondary | ICD-10-CM | POA: Diagnosis not present

## 2020-10-30 DIAGNOSIS — Z992 Dependence on renal dialysis: Secondary | ICD-10-CM | POA: Diagnosis not present

## 2020-10-31 ENCOUNTER — Emergency Department (HOSPITAL_COMMUNITY)
Admission: EM | Admit: 2020-10-31 | Discharge: 2020-10-31 | Disposition: A | Discharge status: Home / Self Care | Payer: Medicare Other | Attending: Emergency Medicine | Admitting: Emergency Medicine

## 2020-10-31 ENCOUNTER — Other Ambulatory Visit: Payer: Self-pay

## 2020-10-31 ENCOUNTER — Encounter (HOSPITAL_COMMUNITY): Payer: Self-pay | Admitting: *Deleted

## 2020-10-31 ENCOUNTER — Emergency Department (HOSPITAL_COMMUNITY): Payer: Medicare Other

## 2020-10-31 DIAGNOSIS — R0602 Shortness of breath: Secondary | ICD-10-CM | POA: Diagnosis not present

## 2020-10-31 DIAGNOSIS — Z7951 Long term (current) use of inhaled steroids: Secondary | ICD-10-CM | POA: Insufficient documentation

## 2020-10-31 DIAGNOSIS — N186 End stage renal disease: Secondary | ICD-10-CM | POA: Diagnosis not present

## 2020-10-31 DIAGNOSIS — Z79899 Other long term (current) drug therapy: Secondary | ICD-10-CM | POA: Diagnosis not present

## 2020-10-31 DIAGNOSIS — J449 Chronic obstructive pulmonary disease, unspecified: Secondary | ICD-10-CM | POA: Insufficient documentation

## 2020-10-31 DIAGNOSIS — I5032 Chronic diastolic (congestive) heart failure: Secondary | ICD-10-CM | POA: Diagnosis not present

## 2020-10-31 DIAGNOSIS — Z96651 Presence of right artificial knee joint: Secondary | ICD-10-CM | POA: Diagnosis not present

## 2020-10-31 DIAGNOSIS — R059 Cough, unspecified: Secondary | ICD-10-CM | POA: Insufficient documentation

## 2020-10-31 DIAGNOSIS — Z87891 Personal history of nicotine dependence: Secondary | ICD-10-CM | POA: Insufficient documentation

## 2020-10-31 DIAGNOSIS — Z794 Long term (current) use of insulin: Secondary | ICD-10-CM | POA: Diagnosis not present

## 2020-10-31 DIAGNOSIS — M25512 Pain in left shoulder: Secondary | ICD-10-CM | POA: Diagnosis not present

## 2020-10-31 DIAGNOSIS — Z992 Dependence on renal dialysis: Secondary | ICD-10-CM | POA: Diagnosis not present

## 2020-10-31 DIAGNOSIS — I700 Atherosclerosis of aorta: Secondary | ICD-10-CM | POA: Diagnosis not present

## 2020-10-31 DIAGNOSIS — I132 Hypertensive heart and chronic kidney disease with heart failure and with stage 5 chronic kidney disease, or end stage renal disease: Secondary | ICD-10-CM | POA: Insufficient documentation

## 2020-10-31 DIAGNOSIS — E1122 Type 2 diabetes mellitus with diabetic chronic kidney disease: Secondary | ICD-10-CM | POA: Insufficient documentation

## 2020-10-31 MED ORDER — ACETAMINOPHEN 500 MG PO TABS
1000.0000 mg | ORAL_TABLET | Freq: Once | ORAL | Status: AC
  Administered 2020-10-31: 1000 mg via ORAL
  Filled 2020-10-31: qty 2

## 2020-10-31 NOTE — ED Provider Notes (Signed)
Patient at this time appears safe and stable for discharge and will be treated as an outpatient.  Discharge plan and strict return to ED precautions discussed, patient verbalizes understanding and agreement. [facility] EMERGENCY DEPARTMENT Provider Note   CSN: [id number] Arrival date & time: 10/31/20  2041     History Chief Complaint  Patient presents with   Shoulder Pain    Adamarys Shall is a 80 y.o. female.  HPI  80 year old female with past medical history of HTN, HLD, DM, CKD presents the emergency department left shoulder pain.  Patient states she has intermittently had chronic pain in the left shoulder however was more severe upon waking this morning.  Pain is worse with movement and touch.  Denies any radiation of pain to the chest, jaw, back.  No associated shortness of breath.  No recent fever or other illness.  Was recently evaluated here in the emergency department for shortness of breath and cough.     Past Medical History:  Diagnosis Date   Anemia in chronic kidney disease (CODE) 06/05/2015   Arthritis    "bad in my legs" (12/07/2014)   Chronic atrial fibrillation ([place]) 06/05/2015   Chronic back pain    "dr said my spine is crooked"   Chronic bronchitis ([place])    "get it q yr" (12/07/2014)   CKD stage 4 due to type 2 diabetes mellitus ([place]) 03/12/7865   Complication of anesthesia    headache for 2 days after surgery in May 2019   COPD (chronic obstructive pulmonary disease) ([place])    Gait difficulty 09/02/2014   Hypercholesterolemia    Hypertension    Neuropathy 2015   in legs   NSVT (nonsustained ventricular tachycardia) ([place])    Sinus brady-tachy syndrome ([place])    Stroke ([place]) 05/2014   denies residual on 12/07/2014   Type II diabetes mellitus ([place])     Patient Active Problem List   Diagnosis Date Noted   Rectal bleeding 12/27/2019   Acute GI bleeding 12/27/2019   NSVT (nonsustained ventricular tachycardia) ([place]) 10/27/2019   Acute respiratory failure  with hypoxia ([place]) 10/26/2019   Megaloblastic anemia 10/26/2019   Chronic blood loss anemia 10/26/2017   Arthritis 10/26/2017   Acute on chronic blood loss anemia 10/26/2017   Iron deficiency anemia 02/23/2017   Chronic diastolic CHF (congestive heart failure) ([place]) 10/15/2016   Elevated troponin 10/15/2016   Type 2 diabetes mellitus with nephropathy ([place]) 10/15/2016   ESRD on dialysis ([place]) 10/15/2016   NSTEMI (non-ST elevated myocardial infarction) ([place]) 10/03/2015   Elevated LFTs 10/03/2015   Encounter for therapeutic drug monitoring 07/29/2015   COPD (chronic obstructive pulmonary disease) ([place]) 06/05/2015   CKD stage 4 due to type 2 diabetes mellitus ([place]) 06/05/2015   Anemia in chronic kidney disease, Stage IV 67/20/9470   Acute diastolic HF (heart failure) ([place]) 06/05/2015   Atrial fibrillation ([place]) 06/05/2015   Atrial fibrillation with rapid ventricular response ([place]) 12/07/2014   TIA (transient ischemic attack) 09/02/2014   Gait difficulty 09/02/2014   Other specified transient cerebral ischemias    Stroke with cerebral ischemia [facility])    Hemispheric carotid artery syndrome    Essential hypertension    Hyperlipidemia    Type 2 diabetes mellitus with other circulatory complications ([place])    Cerebral infarction ([place]) 05/27/2014    Past Surgical History:  Procedure Laterality Date   ABDOMINAL HYSTERECTOMY  ~ 1970   APPENDECTOMY  ~ [place] Right 07/06/2017   Procedure: RIGHT  RADIOCEPHALIC ARTERIOVENOUS FISTULA creation;  Surgeon: Conrad , MD;  Location: [place];  Service: Vascular;  Laterality: Right;   [place] Right 09/17/2017   Procedure: SECOND STAGE BASILIC VEIN TRANSPOSITION RIGHT ARM;  Surgeon: Rosetta Posner, MD;  Location: [place];  Service: Vascular;  Laterality: Right;   COLONOSCOPY N/A 10/28/2017   Procedure: COLONOSCOPY;  Surgeon: Rogene Houston, MD;  [facility];  Service: Endoscopy;  Laterality: N/A;    COLONOSCOPY WITH PROPOFOL N/A 12/29/2019   Procedure: COLONOSCOPY WITH PROPOFOL;  Surgeon: Eloise Harman, DO;  [facility];  Service: Endoscopy;  Laterality: N/A;   ESOPHAGOGASTRODUODENOSCOPY (EGD) WITH PROPOFOL N/A 10/31/2017   Procedure: ESOPHAGOGASTRODUODENOSCOPY (EGD) WITH PROPOFOL;  Surgeon: Rogene Houston, MD;  [facility];  Service: Endoscopy;  Laterality: N/A;   JOINT REPLACEMENT     TOTAL KNEE ARTHROPLASTY Right ~ [place]  ~ 1970   "9# in my stomach"     OB History   No obstetric history on file.     Family History  Problem Relation Age of Onset   Cancer Other    Heart attack Brother    Cancer Sister        breast   Alcohol abuse Brother     Social History   Tobacco Use   Smoking status: Former    Packs/day: 0.50    Years: 50.00    Pack years: 25.00    Types: Cigarettes    Start date: 03/07/1953    Quit date: 01/14/2004    Years since quitting: 16.8   Smokeless tobacco: Never   Tobacco comments:    "quit smoking cigarettes  in ~ 2005"  Vaping Use   Vaping Use: Never used  Substance Use Topics   Alcohol use: No    Alcohol/week: 0.0 standard drinks    Comment: 10/242016 "quit drinking beer in ~ 1977"   Drug use: No    Home Medications Prior to Admission medications   Medication Sig Start Date End Date Taking? Authorizing Provider  albuterol (ACCUNEB) 0.63 MG/3ML nebulizer solution Take 1 ampule by nebulization every 6 (six) hours as needed for wheezing.    [provider]  albuterol (PROVENTIL HFA;VENTOLIN HFA) 108 (90 BASE) MCG/ACT inhaler Inhale 1-2 puffs into the lungs every 6 (six) hours as needed for wheezing or shortness of breath. Reported on 03/01/2015    [provider]  atorvastatin (LIPITOR) 40 MG tablet Take 1 tablet by mouth at bedtime.  01/22/16   [provider]  diltiazem (DILACOR XR) 180 MG 24 hr capsule Take 180 mg by mouth daily.    [provider]   fluticasone-salmeterol (ADVAIR HFA) 230-21 MCG/ACT inhaler Inhale 2 puffs into the lungs 2 (two) times daily.    [provider]  magnesium hydroxide (MILK OF MAGNESIA) 400 MG/5ML suspension Take 15 mLs by mouth daily as needed for mild constipation.    [provider]  metoprolol tartrate (LOPRESSOR) 25 MG tablet Take 0.5 tablets (12.5 mg total) by mouth 2 (two) times daily. 10/18/16   Kathie Dike, MD  pantoprazole (PROTONIX) 40 MG tablet Take 1 tablet (40 mg total) by mouth daily. 12/31/19 12/30/20  Manuella Ghazi, Pratik D, DO  sevelamer carbonate (RENVELA) 800 MG tablet Take 800 mg by mouth 3 (three) times daily with meals.  07/22/19   [provider]  sodium bicarbonate 650 MG tablet Take 1 tablet (650 mg total) by mouth 2 (two) times  daily. 10/17/16   Orson Eva, MD  TRESIBA FLEXTOUCH 100 UNIT/ML FlexTouch Pen Inject 12 Units into the skin daily in the afternoon.  07/23/19   [provider]    Allergies    Codeine  Review of Systems   Review of Systems  Constitutional:  Negative for chills and fever.  HENT:  Negative for congestion.   Eyes:  Negative for visual disturbance.  Respiratory:  Negative for shortness of breath.   Cardiovascular:  Negative for chest pain.  Gastrointestinal:  Negative for abdominal pain, diarrhea and vomiting.  Genitourinary:  Negative for dysuria.  Musculoskeletal:  Negative for back pain and neck pain.       + Left shoulder pain  Skin:  Negative for rash.  Neurological:  Negative for headaches.   Physical Exam Updated Vital Signs BP 96/73 (BP Location: Left Arm)   Pulse 64   Temp (!) 97.3 F (36.3 C) (Temporal)   Resp 20   SpO2 92%   Physical Exam Vitals and nursing note reviewed.  Constitutional:      Appearance: Normal appearance.  HENT:     Head: Normocephalic.     Mouth/Throat:     Mouth: Mucous membranes are moist.  Cardiovascular:     Rate and Rhythm: Normal rate.  Pulmonary:     Effort: Pulmonary effort is  normal. No respiratory distress.  Abdominal:     Palpations: Abdomen is soft.     Tenderness: There is no abdominal tenderness.  Musculoskeletal:     Comments: Left shoulder is tender to palpation and with range of motion, equal palpable radial pulses and equal grip strength  Skin:    General: Skin is warm.  Neurological:     Mental Status: She is alert and oriented to person, place, and time. Mental status is at baseline.  Psychiatric:        Mood and Affect: Mood normal.    ED Results / Procedures / Treatments   Labs (all labs ordered are listed, but only abnormal results are displayed) Labs Reviewed - No data to display  EKG None  Radiology DG Shoulder Left  Result Date: 10/31/2020 CLINICAL DATA:  Pain.  Limited mobility.  History of arthritis. EXAM: LEFT SHOULDER - 2+ VIEW COMPARISON:  None. FINDINGS: There is no evidence of fracture or dislocation. There is no evidence of severe arthropathy or other focal bone abnormality. Soft tissues are unremarkable. Aortic calcification. IMPRESSION: 1. No acute displaced fracture or dislocation of the left shoulder on this two view radiograph. 2.  Aortic Atherosclerosis (ICD10-I70.0). Electronically Signed   By: Iven Finn M.D.   On: 10/31/2020 21:35    Procedures Procedures   Medications Ordered in ED Medications  acetaminophen (TYLENOL) tablet 1,000 mg (has no administration in time range)    ED Course  I have reviewed the triage vital signs and the nursing notes.  Pertinent labs & imaging results that were available during my care of the patient were reviewed by me and considered in my medical decision making (see chart for details).    MDM Rules/Calculators/A&P                            80 year old female presents emergency department evaluation of left shoulder pain.  Acute on chronic.  Vitals are stable on arrival.  Just recently had a work-up for shortness of breath here in the emergency department that was  baseline.  No reported chest pain, shortness  of breath, back pain at this time.  EKG today is unchanged for the patient.  Low suspicion for ACS given how localized the pain is to the shoulder and reproducible.  No respiratory complaints, equal breath sounds.  X-ray shows no acute fracture.  Pain for outpatient follow-up.  Patient at this time appears safe and stable for discharge and will be treated as an outpatient.  Discharge plan and strict return to ED precautions discussed, patient verbalizes understanding and agreement.  Final Clinical Impression(s) / ED Diagnoses Final diagnoses:  Left shoulder pain, unspecified chronicity    Rx / DC Orders ED Discharge Orders     None        Lorelle Gibbs, DO 10/31/20 2357

## 2020-10-31 NOTE — Discharge Instructions (Addendum)
You have been seen and discharged from the emergency department.  The x-ray of your shoulder shows no fracture. Follow-up with your primary provider for reevaluation and further care. Take home medications as prescribed. If you have any worsening symptoms or further concerns for your health please return to an emergency department for further evaluation.

## 2020-10-31 NOTE — ED Triage Notes (Signed)
Pt with left shoulder pain since 0900 this am. Pt here the other night for sob and cough on 9/14.

## 2020-11-01 LAB — CULTURE, BLOOD (ROUTINE X 2)
Culture: NO GROWTH
Culture: NO GROWTH
Special Requests: ADEQUATE
Special Requests: ADEQUATE

## 2020-11-02 DIAGNOSIS — N2581 Secondary hyperparathyroidism of renal origin: Secondary | ICD-10-CM | POA: Diagnosis not present

## 2020-11-02 DIAGNOSIS — Z992 Dependence on renal dialysis: Secondary | ICD-10-CM | POA: Diagnosis not present

## 2020-11-02 DIAGNOSIS — N186 End stage renal disease: Secondary | ICD-10-CM | POA: Diagnosis not present

## 2020-11-04 DIAGNOSIS — Z992 Dependence on renal dialysis: Secondary | ICD-10-CM | POA: Diagnosis not present

## 2020-11-04 DIAGNOSIS — N186 End stage renal disease: Secondary | ICD-10-CM | POA: Diagnosis not present

## 2020-11-06 DIAGNOSIS — Z992 Dependence on renal dialysis: Secondary | ICD-10-CM | POA: Diagnosis not present

## 2020-11-06 DIAGNOSIS — N186 End stage renal disease: Secondary | ICD-10-CM | POA: Diagnosis not present

## 2020-11-09 DIAGNOSIS — N186 End stage renal disease: Secondary | ICD-10-CM | POA: Diagnosis not present

## 2020-11-09 DIAGNOSIS — Z992 Dependence on renal dialysis: Secondary | ICD-10-CM | POA: Diagnosis not present

## 2020-11-11 DIAGNOSIS — N186 End stage renal disease: Secondary | ICD-10-CM | POA: Diagnosis not present

## 2020-11-11 DIAGNOSIS — Z992 Dependence on renal dialysis: Secondary | ICD-10-CM | POA: Diagnosis not present

## 2020-11-12 DIAGNOSIS — G250 Essential tremor: Secondary | ICD-10-CM | POA: Diagnosis not present

## 2020-11-12 DIAGNOSIS — Z992 Dependence on renal dialysis: Secondary | ICD-10-CM | POA: Diagnosis not present

## 2020-11-12 DIAGNOSIS — N186 End stage renal disease: Secondary | ICD-10-CM | POA: Diagnosis not present

## 2020-11-12 DIAGNOSIS — I10 Essential (primary) hypertension: Secondary | ICD-10-CM | POA: Diagnosis not present

## 2020-11-13 DIAGNOSIS — Z23 Encounter for immunization: Secondary | ICD-10-CM | POA: Diagnosis not present

## 2020-11-13 DIAGNOSIS — Z992 Dependence on renal dialysis: Secondary | ICD-10-CM | POA: Diagnosis not present

## 2020-11-13 DIAGNOSIS — N186 End stage renal disease: Secondary | ICD-10-CM | POA: Diagnosis not present

## 2020-11-16 DIAGNOSIS — N186 End stage renal disease: Secondary | ICD-10-CM | POA: Diagnosis not present

## 2020-11-16 DIAGNOSIS — M79604 Pain in right leg: Secondary | ICD-10-CM | POA: Diagnosis not present

## 2020-11-16 DIAGNOSIS — Z992 Dependence on renal dialysis: Secondary | ICD-10-CM | POA: Diagnosis not present

## 2020-11-16 DIAGNOSIS — N2581 Secondary hyperparathyroidism of renal origin: Secondary | ICD-10-CM | POA: Diagnosis not present

## 2020-11-16 DIAGNOSIS — Z23 Encounter for immunization: Secondary | ICD-10-CM | POA: Diagnosis not present

## 2020-11-16 DIAGNOSIS — M79605 Pain in left leg: Secondary | ICD-10-CM | POA: Diagnosis not present

## 2020-11-18 DIAGNOSIS — Z992 Dependence on renal dialysis: Secondary | ICD-10-CM | POA: Diagnosis not present

## 2020-11-18 DIAGNOSIS — Z23 Encounter for immunization: Secondary | ICD-10-CM | POA: Diagnosis not present

## 2020-11-18 DIAGNOSIS — E119 Type 2 diabetes mellitus without complications: Secondary | ICD-10-CM | POA: Diagnosis not present

## 2020-11-18 DIAGNOSIS — N186 End stage renal disease: Secondary | ICD-10-CM | POA: Diagnosis not present

## 2020-11-18 DIAGNOSIS — Z794 Long term (current) use of insulin: Secondary | ICD-10-CM | POA: Diagnosis not present

## 2020-11-20 DIAGNOSIS — N186 End stage renal disease: Secondary | ICD-10-CM | POA: Diagnosis not present

## 2020-11-20 DIAGNOSIS — Z992 Dependence on renal dialysis: Secondary | ICD-10-CM | POA: Diagnosis not present

## 2020-11-20 DIAGNOSIS — Z23 Encounter for immunization: Secondary | ICD-10-CM | POA: Diagnosis not present

## 2020-11-23 DIAGNOSIS — Z23 Encounter for immunization: Secondary | ICD-10-CM | POA: Diagnosis not present

## 2020-11-23 DIAGNOSIS — N186 End stage renal disease: Secondary | ICD-10-CM | POA: Diagnosis not present

## 2020-11-23 DIAGNOSIS — Z992 Dependence on renal dialysis: Secondary | ICD-10-CM | POA: Diagnosis not present

## 2020-11-25 DIAGNOSIS — N186 End stage renal disease: Secondary | ICD-10-CM | POA: Diagnosis not present

## 2020-11-25 DIAGNOSIS — Z992 Dependence on renal dialysis: Secondary | ICD-10-CM | POA: Diagnosis not present

## 2020-11-25 DIAGNOSIS — Z23 Encounter for immunization: Secondary | ICD-10-CM | POA: Diagnosis not present

## 2020-11-27 DIAGNOSIS — J9601 Acute respiratory failure with hypoxia: Secondary | ICD-10-CM | POA: Diagnosis not present

## 2020-11-27 DIAGNOSIS — Z23 Encounter for immunization: Secondary | ICD-10-CM | POA: Diagnosis not present

## 2020-11-27 DIAGNOSIS — N186 End stage renal disease: Secondary | ICD-10-CM | POA: Diagnosis not present

## 2020-11-27 DIAGNOSIS — I5032 Chronic diastolic (congestive) heart failure: Secondary | ICD-10-CM | POA: Diagnosis not present

## 2020-11-27 DIAGNOSIS — R6889 Other general symptoms and signs: Secondary | ICD-10-CM | POA: Diagnosis not present

## 2020-11-27 DIAGNOSIS — Z992 Dependence on renal dialysis: Secondary | ICD-10-CM | POA: Diagnosis not present

## 2020-11-27 DIAGNOSIS — M6281 Muscle weakness (generalized): Secondary | ICD-10-CM | POA: Diagnosis not present

## 2020-11-29 DIAGNOSIS — Z299 Encounter for prophylactic measures, unspecified: Secondary | ICD-10-CM | POA: Diagnosis not present

## 2020-11-29 DIAGNOSIS — I10 Essential (primary) hypertension: Secondary | ICD-10-CM | POA: Diagnosis not present

## 2020-11-29 DIAGNOSIS — J449 Chronic obstructive pulmonary disease, unspecified: Secondary | ICD-10-CM | POA: Diagnosis not present

## 2020-11-29 DIAGNOSIS — L97509 Non-pressure chronic ulcer of other part of unspecified foot with unspecified severity: Secondary | ICD-10-CM | POA: Diagnosis not present

## 2020-11-29 DIAGNOSIS — E11621 Type 2 diabetes mellitus with foot ulcer: Secondary | ICD-10-CM | POA: Diagnosis not present

## 2020-11-29 DIAGNOSIS — E1142 Type 2 diabetes mellitus with diabetic polyneuropathy: Secondary | ICD-10-CM | POA: Diagnosis not present

## 2020-11-30 DIAGNOSIS — Z992 Dependence on renal dialysis: Secondary | ICD-10-CM | POA: Diagnosis not present

## 2020-11-30 DIAGNOSIS — Z23 Encounter for immunization: Secondary | ICD-10-CM | POA: Diagnosis not present

## 2020-11-30 DIAGNOSIS — N186 End stage renal disease: Secondary | ICD-10-CM | POA: Diagnosis not present

## 2020-11-30 DIAGNOSIS — N2581 Secondary hyperparathyroidism of renal origin: Secondary | ICD-10-CM | POA: Diagnosis not present

## 2020-12-02 DIAGNOSIS — Z23 Encounter for immunization: Secondary | ICD-10-CM | POA: Diagnosis not present

## 2020-12-02 DIAGNOSIS — Z992 Dependence on renal dialysis: Secondary | ICD-10-CM | POA: Diagnosis not present

## 2020-12-02 DIAGNOSIS — N186 End stage renal disease: Secondary | ICD-10-CM | POA: Diagnosis not present

## 2020-12-04 DIAGNOSIS — Z23 Encounter for immunization: Secondary | ICD-10-CM | POA: Diagnosis not present

## 2020-12-04 DIAGNOSIS — N186 End stage renal disease: Secondary | ICD-10-CM | POA: Diagnosis not present

## 2020-12-04 DIAGNOSIS — Z992 Dependence on renal dialysis: Secondary | ICD-10-CM | POA: Diagnosis not present

## 2020-12-07 DIAGNOSIS — N2581 Secondary hyperparathyroidism of renal origin: Secondary | ICD-10-CM | POA: Diagnosis not present

## 2020-12-07 DIAGNOSIS — Z992 Dependence on renal dialysis: Secondary | ICD-10-CM | POA: Diagnosis not present

## 2020-12-07 DIAGNOSIS — N186 End stage renal disease: Secondary | ICD-10-CM | POA: Diagnosis not present

## 2020-12-07 DIAGNOSIS — Z23 Encounter for immunization: Secondary | ICD-10-CM | POA: Diagnosis not present

## 2020-12-09 DIAGNOSIS — Z23 Encounter for immunization: Secondary | ICD-10-CM | POA: Diagnosis not present

## 2020-12-09 DIAGNOSIS — N186 End stage renal disease: Secondary | ICD-10-CM | POA: Diagnosis not present

## 2020-12-09 DIAGNOSIS — Z992 Dependence on renal dialysis: Secondary | ICD-10-CM | POA: Diagnosis not present

## 2020-12-11 DIAGNOSIS — Z992 Dependence on renal dialysis: Secondary | ICD-10-CM | POA: Diagnosis not present

## 2020-12-11 DIAGNOSIS — N186 End stage renal disease: Secondary | ICD-10-CM | POA: Diagnosis not present

## 2020-12-11 DIAGNOSIS — Z23 Encounter for immunization: Secondary | ICD-10-CM | POA: Diagnosis not present

## 2020-12-13 DIAGNOSIS — J449 Chronic obstructive pulmonary disease, unspecified: Secondary | ICD-10-CM | POA: Diagnosis not present

## 2020-12-13 DIAGNOSIS — E1122 Type 2 diabetes mellitus with diabetic chronic kidney disease: Secondary | ICD-10-CM | POA: Diagnosis not present

## 2020-12-13 DIAGNOSIS — N184 Chronic kidney disease, stage 4 (severe): Secondary | ICD-10-CM | POA: Diagnosis not present

## 2020-12-13 DIAGNOSIS — N186 End stage renal disease: Secondary | ICD-10-CM | POA: Diagnosis not present

## 2020-12-13 DIAGNOSIS — Z992 Dependence on renal dialysis: Secondary | ICD-10-CM | POA: Diagnosis not present

## 2020-12-14 DIAGNOSIS — N186 End stage renal disease: Secondary | ICD-10-CM | POA: Diagnosis not present

## 2020-12-14 DIAGNOSIS — N2581 Secondary hyperparathyroidism of renal origin: Secondary | ICD-10-CM | POA: Diagnosis not present

## 2020-12-14 DIAGNOSIS — Z992 Dependence on renal dialysis: Secondary | ICD-10-CM | POA: Diagnosis not present

## 2020-12-16 DIAGNOSIS — Z992 Dependence on renal dialysis: Secondary | ICD-10-CM | POA: Diagnosis not present

## 2020-12-16 DIAGNOSIS — N186 End stage renal disease: Secondary | ICD-10-CM | POA: Diagnosis not present

## 2020-12-18 DIAGNOSIS — Z992 Dependence on renal dialysis: Secondary | ICD-10-CM | POA: Diagnosis not present

## 2020-12-18 DIAGNOSIS — N186 End stage renal disease: Secondary | ICD-10-CM | POA: Diagnosis not present

## 2020-12-21 DIAGNOSIS — N186 End stage renal disease: Secondary | ICD-10-CM | POA: Diagnosis not present

## 2020-12-21 DIAGNOSIS — Z992 Dependence on renal dialysis: Secondary | ICD-10-CM | POA: Diagnosis not present

## 2020-12-23 DIAGNOSIS — Z992 Dependence on renal dialysis: Secondary | ICD-10-CM | POA: Diagnosis not present

## 2020-12-23 DIAGNOSIS — N186 End stage renal disease: Secondary | ICD-10-CM | POA: Diagnosis not present

## 2020-12-25 DIAGNOSIS — Z992 Dependence on renal dialysis: Secondary | ICD-10-CM | POA: Diagnosis not present

## 2020-12-25 DIAGNOSIS — N186 End stage renal disease: Secondary | ICD-10-CM | POA: Diagnosis not present

## 2020-12-28 DIAGNOSIS — I5032 Chronic diastolic (congestive) heart failure: Secondary | ICD-10-CM | POA: Diagnosis not present

## 2020-12-28 DIAGNOSIS — N2581 Secondary hyperparathyroidism of renal origin: Secondary | ICD-10-CM | POA: Diagnosis not present

## 2020-12-28 DIAGNOSIS — J9601 Acute respiratory failure with hypoxia: Secondary | ICD-10-CM | POA: Diagnosis not present

## 2020-12-28 DIAGNOSIS — R6889 Other general symptoms and signs: Secondary | ICD-10-CM | POA: Diagnosis not present

## 2020-12-28 DIAGNOSIS — Z992 Dependence on renal dialysis: Secondary | ICD-10-CM | POA: Diagnosis not present

## 2020-12-28 DIAGNOSIS — N186 End stage renal disease: Secondary | ICD-10-CM | POA: Diagnosis not present

## 2020-12-28 DIAGNOSIS — M6281 Muscle weakness (generalized): Secondary | ICD-10-CM | POA: Diagnosis not present

## 2020-12-30 DIAGNOSIS — Z992 Dependence on renal dialysis: Secondary | ICD-10-CM | POA: Diagnosis not present

## 2020-12-30 DIAGNOSIS — N186 End stage renal disease: Secondary | ICD-10-CM | POA: Diagnosis not present

## 2021-01-01 DIAGNOSIS — Z992 Dependence on renal dialysis: Secondary | ICD-10-CM | POA: Diagnosis not present

## 2021-01-01 DIAGNOSIS — N186 End stage renal disease: Secondary | ICD-10-CM | POA: Diagnosis not present

## 2021-01-04 DIAGNOSIS — N186 End stage renal disease: Secondary | ICD-10-CM | POA: Diagnosis not present

## 2021-01-04 DIAGNOSIS — Z992 Dependence on renal dialysis: Secondary | ICD-10-CM | POA: Diagnosis not present

## 2021-01-06 DIAGNOSIS — N186 End stage renal disease: Secondary | ICD-10-CM | POA: Diagnosis not present

## 2021-01-06 DIAGNOSIS — Z992 Dependence on renal dialysis: Secondary | ICD-10-CM | POA: Diagnosis not present

## 2021-01-08 DIAGNOSIS — N186 End stage renal disease: Secondary | ICD-10-CM | POA: Diagnosis not present

## 2021-01-08 DIAGNOSIS — Z992 Dependence on renal dialysis: Secondary | ICD-10-CM | POA: Diagnosis not present

## 2021-01-10 NOTE — Progress Notes (Signed)
Cardiology Office Note   Date:  01/12/2021   ID:  Jody [name], DOB December 30, 1940, MRN [id number]  PCP:  Monico Blitz, MD  Cardiologist:   Minus Breeding, MD   Chief Complaint  Patient presents with   Leg Pain       History of Present Illness: Jody [name] is a 80 y.o. female who presents for follow up of atrial fib, chronic diastolic HF and NSVT.  She was previously seen by Dr. Bronson Ing.     She has dialysis dependent renal failure.  She lives by herself but she has an aide that comes.  Her niece is her healthcare power of attorney .  Since I last saw her she was again in the emergency room in September twice.  I reviewed these records for this visit.  She had shoulder pain but there was no objective evidence of ischemia and it was not thought to be an acute coronary syndrome.   I questioned her about her ER visits 1 of which was for shoulder pain and another 1 for shortness of breath she says it was because her legs hurt.  Not entirely clear to me what is going on when she presented to the ER but it does not sound like an acute cardiac issue.  It is also clear on talking to her that she does not take her medicines necessarily as prescribed.  She takes Eliquis sometimes.  The beta-blocker seems to have fallen off the list but her heart rate and blood pressure seem to be well controlled.  It may be that these are being managed at dialysis.  She is not having any new shortness of breath, PND or orthopnea.  She gets around with a rolling walker.  She does not really notice her heart racing or skipping.  She had no presyncope or syncope.  Past Medical History:  Diagnosis Date   Anemia in chronic kidney disease (CODE) 06/05/2015   Arthritis    "bad in my legs" (12/07/2014)   Chronic atrial fibrillation ([place]) 06/05/2015   Chronic back pain    "dr said my spine is crooked"   Chronic bronchitis ([place])    "get it q yr" (12/07/2014)   CKD stage 4 due to type 2 diabetes mellitus ([place])  3/70/4888   Complication of anesthesia    headache for 2 days after surgery in May 2019   COPD (chronic obstructive pulmonary disease) ([place])    Gait difficulty 09/02/2014   Hypercholesterolemia    Hypertension    Neuropathy 2015   in legs   NSVT (nonsustained ventricular tachycardia)    Sinus brady-tachy syndrome ([place])    Stroke ([place]) 05/2014   denies residual on 12/07/2014   Type II diabetes mellitus ([place])     Past Surgical History:  Procedure Laterality Date   ABDOMINAL HYSTERECTOMY  ~ [place]  ~ [place] Right 07/06/2017   Procedure: RIGHT RADIOCEPHALIC ARTERIOVENOUS FISTULA creation;  Surgeon: Conrad Roseland, MD;  Location: [place];  Service: Vascular;  Laterality: Right;   [place] Right 09/17/2017   Procedure: SECOND STAGE BASILIC VEIN TRANSPOSITION RIGHT ARM;  Surgeon: Rosetta Posner, MD;  Location[facility];  Service: Vascular;  Laterality: Right;   COLONOSCOPY N/A 10/28/2017   Procedure: COLONOSCOPY;  Surgeon: Rogene Houston, MD;  [facility];  Service: Endoscopy;  Laterality: N/A;   COLONOSCOPY WITH PROPOFOL N/A 12/29/2019   Procedure: COLONOSCOPY WITH PROPOFOL;  Surgeon: Abbey Chatters,  Elon Alas, DO;  [facility];  Service: Endoscopy;  Laterality: N/A;   ESOPHAGOGASTRODUODENOSCOPY (EGD) WITH PROPOFOL N/A 10/31/2017   Procedure: ESOPHAGOGASTRODUODENOSCOPY (EGD) WITH PROPOFOL;  Surgeon: Rogene Houston, MD;  [facility];  Service: Endoscopy;  Laterality: N/A;   JOINT REPLACEMENT     TOTAL KNEE ARTHROPLASTY Right ~ [place]  ~ 1970   "9# in my stomach"     Current Outpatient Medications  Medication Sig Dispense Refill   albuterol (ACCUNEB) 0.63 MG/3ML nebulizer solution Take 1 ampule by nebulization every 6 (six) hours as needed for wheezing.     albuterol (PROVENTIL HFA;VENTOLIN HFA) 108 (90 BASE) MCG/ACT inhaler Inhale 1-2 puffs into the lungs every 6 (six) hours as needed for wheezing  or shortness of breath. Reported on 03/01/2015     apixaban (ELIQUIS) 2.5 MG TABS tablet Take by mouth 2 (two) times daily.     atorvastatin (LIPITOR) 40 MG tablet Take 1 tablet by mouth at bedtime.   0   diltiazem (TIAZAC) 120 MG 24 hr capsule Take 120 mg by mouth daily.     fluticasone-salmeterol (ADVAIR HFA) 230-21 MCG/ACT inhaler Inhale 2 puffs into the lungs 2 (two) times daily.     furosemide (LASIX) 40 MG tablet Take 40 mg by mouth.     gabapentin (NEURONTIN) 100 MG capsule Take 100 mg by mouth 3 (three) times daily.     magnesium hydroxide (MILK OF MAGNESIA) 400 MG/5ML suspension Take 15 mLs by mouth daily as needed for mild constipation.     pramipexole (MIRAPEX) 0.125 MG tablet Take 0.125 mg by mouth daily.     sevelamer carbonate (RENVELA) 800 MG tablet Take 800 mg by mouth 3 (three) times daily with meals.      metoprolol tartrate (LOPRESSOR) 25 MG tablet Take 0.5 tablets (12.5 mg total) by mouth 2 (two) times daily. (Patient not taking: Reported on 01/12/2021) 30 tablet 0   pantoprazole (PROTONIX) 40 MG tablet Take 1 tablet (40 mg total) by mouth daily. 30 tablet 1   sodium bicarbonate 650 MG tablet Take 1 tablet (650 mg total) by mouth 2 (two) times daily. (Patient not taking: Reported on 01/12/2021) 60 tablet 1   TRESIBA FLEXTOUCH 100 UNIT/ML FlexTouch Pen Inject 12 Units into the skin daily in the afternoon.  (Patient not taking: Reported on 01/12/2021)     No current facility-administered medications for this visit.    Allergies:   Codeine    ROS:  Please see the history of present illness.   Otherwise, review of systems are positive for none.   All other systems are reviewed and negative.    PHYSICAL EXAM: VS:  BP 110/70   Pulse 60   Ht 5\' 7"  (1.702 m)   Wt 186 lb (84.4 kg)   BMI 29.13 kg/m  , BMI Body mass index is 29.13 kg/m. GEN:  No distress NECK:  No jugular venous distention at 90 degrees, waveform within normal limits, carotid upstroke brisk and symmetric, no  bruits, no thyromegaly LYMPHATICS:  No cervical adenopathy LUNGS:  Clear to auscultation bilaterally BACK:  No CVA tenderness CHEST:  Unremarkable HEART:  S1 and S2 within normal limits, no S3, no S4, no clicks, no rubs, no murmurs ABD:  Positive bowel sounds normal in frequency in pitch, no bruits, no rebound, no guarding, unable to assess midline mass or bruit with the patient seated. EXT:  2 plus pulses throughout, mild edema, no cyanosis no clubbing  SKIN:  No rashes no nodules NEURO:  Cranial nerves II through XII grossly intact, motor grossly intact throughout PSYCH:  Cognitively intact, oriented to person place and time   EKG:  EKG is not  ordered today.   Recent Labs: 06/03/2020: ALT 21 10/27/2020: B Natriuretic Peptide 1,496.0; BUN 45; Creatinine, Ser 6.66; Hemoglobin 9.3; Platelets 159; Potassium 3.9; Sodium 134    Lipid Panel    Component Value Date/Time   CHOL 156 10/04/2015 0301   TRIG 51 10/04/2015 0301   HDL 54 10/04/2015 0301   CHOLHDL 2.9 10/04/2015 0301   VLDL 10 10/04/2015 0301   LDLCALC 92 10/04/2015 0301      Wt Readings from Last 3 Encounters:  01/12/21 186 lb (84.4 kg)  10/27/20 185 lb 3 oz (84 kg)  10/04/20 184 lb (83.5 kg)     Other studies Reviewed: Additional studies/ records that were reviewed today include:  ED records. Review of the above records demonstrates:  Please see elsewhere in the note.     ASSESSMENT AND PLAN:  Persistent atrial fibrillation:     We had a long conversation about her blood thinner.  She will continue on Eliquis.  She has no contraindication.  She does have some chronic anemia probably related to her renal disease and no evidence of active bleeding.  No change in therapy is indicated.   Chronic diastolic heart failure:   Her volume is managed with dialysis.  No change in therapy.   History of nonsustained ventricular tachycardia:   She had no ischemia.  She has a mildly reduced ejection fraction.  She does not have  any symptoms.  No change in therapy.  Cardiomyopathy: She does have a mildly reduced ejection fraction.  I reviewed this with her.  There is no evidence of ischemia.  I do not think she would tolerate med titration or necessarily take the meds as prescribed.  I will leave her on the meds as listed.  Current medicines are reviewed at length with the patient today.  The patient does not have concerns regarding medicines.  The following changes have been made: None   Labs/ tests ordered today include:   None  No orders of the defined types were placed in this encounter.    Disposition:   FU with me in 12 months   Signed, Minus Breeding, MD  01/12/2021 11:14 AM    [place]

## 2021-01-11 DIAGNOSIS — N2581 Secondary hyperparathyroidism of renal origin: Secondary | ICD-10-CM | POA: Diagnosis not present

## 2021-01-11 DIAGNOSIS — Z992 Dependence on renal dialysis: Secondary | ICD-10-CM | POA: Diagnosis not present

## 2021-01-11 DIAGNOSIS — N186 End stage renal disease: Secondary | ICD-10-CM | POA: Diagnosis not present

## 2021-01-12 ENCOUNTER — Other Ambulatory Visit: Payer: Self-pay

## 2021-01-12 ENCOUNTER — Encounter: Payer: Self-pay | Admitting: Cardiology

## 2021-01-12 ENCOUNTER — Ambulatory Visit (INDEPENDENT_AMBULATORY_CARE_PROVIDER_SITE_OTHER): Payer: Medicare Other | Admitting: Cardiology

## 2021-01-12 VITALS — BP 110/70 | HR 60 | Ht 67.0 in | Wt 186.0 lb

## 2021-01-12 DIAGNOSIS — N186 End stage renal disease: Secondary | ICD-10-CM | POA: Diagnosis not present

## 2021-01-12 DIAGNOSIS — I4729 Other ventricular tachycardia: Secondary | ICD-10-CM | POA: Diagnosis not present

## 2021-01-12 DIAGNOSIS — I4819 Other persistent atrial fibrillation: Secondary | ICD-10-CM | POA: Diagnosis not present

## 2021-01-12 DIAGNOSIS — I5032 Chronic diastolic (congestive) heart failure: Secondary | ICD-10-CM

## 2021-01-12 DIAGNOSIS — Z992 Dependence on renal dialysis: Secondary | ICD-10-CM | POA: Diagnosis not present

## 2021-01-12 NOTE — Patient Instructions (Signed)
Medication Instructions:  The current medical regimen is effective;  continue present plan and medications.  *If you need a refill on your cardiac medications before your next appointment, please call your pharmacy*  Follow-Up: At [facility], you and your health needs are our priority.  As part of our continuing mission to provide you with exceptional heart care, we have created designated Provider Care Teams.  These Care Teams include your primary Cardiologist (physician) and Advanced Practice Providers (APPs -  Physician Assistants and Nurse Practitioners) who all work together to provide you with the care you need, when you need it.  We recommend signing up for the patient portal called "MyChart".  Sign up information is provided on this After Visit Summary.  MyChart is used to connect with patients for Virtual Visits (Telemedicine).  Patients are able to view lab/test results, encounter notes, upcoming appointments, etc.  Non-urgent messages can be sent to your provider as well.   To learn more about what you can do with MyChart, go to [email or web address].    Your next appointment:   1 year(s)  The format for your next appointment:   In Person  Provider:   James Hochrein, MD   Thank you for choosing [facility]!!    

## 2021-01-13 DIAGNOSIS — N186 End stage renal disease: Secondary | ICD-10-CM | POA: Diagnosis not present

## 2021-01-13 DIAGNOSIS — Z992 Dependence on renal dialysis: Secondary | ICD-10-CM | POA: Diagnosis not present

## 2021-01-15 DIAGNOSIS — Z992 Dependence on renal dialysis: Secondary | ICD-10-CM | POA: Diagnosis not present

## 2021-01-15 DIAGNOSIS — N186 End stage renal disease: Secondary | ICD-10-CM | POA: Diagnosis not present

## 2021-01-18 DIAGNOSIS — N186 End stage renal disease: Secondary | ICD-10-CM | POA: Diagnosis not present

## 2021-01-18 DIAGNOSIS — Z992 Dependence on renal dialysis: Secondary | ICD-10-CM | POA: Diagnosis not present

## 2021-01-20 DIAGNOSIS — N186 End stage renal disease: Secondary | ICD-10-CM | POA: Diagnosis not present

## 2021-01-20 DIAGNOSIS — Z992 Dependence on renal dialysis: Secondary | ICD-10-CM | POA: Diagnosis not present

## 2021-01-22 DIAGNOSIS — Z992 Dependence on renal dialysis: Secondary | ICD-10-CM | POA: Diagnosis not present

## 2021-01-22 DIAGNOSIS — N186 End stage renal disease: Secondary | ICD-10-CM | POA: Diagnosis not present

## 2021-01-25 DIAGNOSIS — I120 Hypertensive chronic kidney disease with stage 5 chronic kidney disease or end stage renal disease: Secondary | ICD-10-CM | POA: Diagnosis not present

## 2021-01-25 DIAGNOSIS — R079 Chest pain, unspecified: Secondary | ICD-10-CM | POA: Diagnosis not present

## 2021-01-25 DIAGNOSIS — J449 Chronic obstructive pulmonary disease, unspecified: Secondary | ICD-10-CM | POA: Diagnosis not present

## 2021-01-25 DIAGNOSIS — N186 End stage renal disease: Secondary | ICD-10-CM | POA: Diagnosis not present

## 2021-01-25 DIAGNOSIS — R52 Pain, unspecified: Secondary | ICD-10-CM | POA: Diagnosis not present

## 2021-01-25 DIAGNOSIS — R0902 Hypoxemia: Secondary | ICD-10-CM | POA: Diagnosis not present

## 2021-01-25 DIAGNOSIS — M79606 Pain in leg, unspecified: Secondary | ICD-10-CM | POA: Diagnosis not present

## 2021-01-25 DIAGNOSIS — M79601 Pain in right arm: Secondary | ICD-10-CM | POA: Diagnosis not present

## 2021-01-25 DIAGNOSIS — M25511 Pain in right shoulder: Secondary | ICD-10-CM | POA: Diagnosis not present

## 2021-01-25 DIAGNOSIS — I4891 Unspecified atrial fibrillation: Secondary | ICD-10-CM | POA: Diagnosis not present

## 2021-01-25 DIAGNOSIS — R600 Localized edema: Secondary | ICD-10-CM | POA: Diagnosis not present

## 2021-01-25 DIAGNOSIS — M549 Dorsalgia, unspecified: Secondary | ICD-10-CM | POA: Diagnosis not present

## 2021-01-25 DIAGNOSIS — M19011 Primary osteoarthritis, right shoulder: Secondary | ICD-10-CM | POA: Diagnosis not present

## 2021-01-25 DIAGNOSIS — R0602 Shortness of breath: Secondary | ICD-10-CM | POA: Diagnosis not present

## 2021-01-25 DIAGNOSIS — R9431 Abnormal electrocardiogram [ECG] [EKG]: Secondary | ICD-10-CM | POA: Diagnosis not present

## 2021-01-25 DIAGNOSIS — E1140 Type 2 diabetes mellitus with diabetic neuropathy, unspecified: Secondary | ICD-10-CM | POA: Diagnosis not present

## 2021-01-25 DIAGNOSIS — E785 Hyperlipidemia, unspecified: Secondary | ICD-10-CM | POA: Diagnosis not present

## 2021-01-25 DIAGNOSIS — Z992 Dependence on renal dialysis: Secondary | ICD-10-CM | POA: Diagnosis not present

## 2021-01-25 DIAGNOSIS — J811 Chronic pulmonary edema: Secondary | ICD-10-CM | POA: Diagnosis not present

## 2021-01-25 DIAGNOSIS — E1122 Type 2 diabetes mellitus with diabetic chronic kidney disease: Secondary | ICD-10-CM | POA: Diagnosis not present

## 2021-01-25 DIAGNOSIS — Z743 Need for continuous supervision: Secondary | ICD-10-CM | POA: Diagnosis not present

## 2021-01-25 DIAGNOSIS — R918 Other nonspecific abnormal finding of lung field: Secondary | ICD-10-CM | POA: Diagnosis not present

## 2021-01-27 DIAGNOSIS — M6281 Muscle weakness (generalized): Secondary | ICD-10-CM | POA: Diagnosis not present

## 2021-01-27 DIAGNOSIS — I5032 Chronic diastolic (congestive) heart failure: Secondary | ICD-10-CM | POA: Diagnosis not present

## 2021-01-27 DIAGNOSIS — Z992 Dependence on renal dialysis: Secondary | ICD-10-CM | POA: Diagnosis not present

## 2021-01-27 DIAGNOSIS — N186 End stage renal disease: Secondary | ICD-10-CM | POA: Diagnosis not present

## 2021-01-27 DIAGNOSIS — R6889 Other general symptoms and signs: Secondary | ICD-10-CM | POA: Diagnosis not present

## 2021-01-27 DIAGNOSIS — J9601 Acute respiratory failure with hypoxia: Secondary | ICD-10-CM | POA: Diagnosis not present

## 2021-01-29 ENCOUNTER — Emergency Department (HOSPITAL_COMMUNITY)
Admission: EM | Admit: 2021-01-29 | Discharge: 2021-01-30 | Disposition: A | Discharge status: Home / Self Care | Payer: Medicare Other | Attending: Emergency Medicine | Admitting: Emergency Medicine

## 2021-01-29 ENCOUNTER — Encounter (HOSPITAL_COMMUNITY): Payer: Self-pay | Admitting: Emergency Medicine

## 2021-01-29 ENCOUNTER — Emergency Department (HOSPITAL_COMMUNITY): Payer: Medicare Other

## 2021-01-29 ENCOUNTER — Other Ambulatory Visit: Payer: Self-pay

## 2021-01-29 DIAGNOSIS — Z743 Need for continuous supervision: Secondary | ICD-10-CM | POA: Diagnosis not present

## 2021-01-29 DIAGNOSIS — M25552 Pain in left hip: Secondary | ICD-10-CM | POA: Diagnosis not present

## 2021-01-29 DIAGNOSIS — Z96651 Presence of right artificial knee joint: Secondary | ICD-10-CM | POA: Insufficient documentation

## 2021-01-29 DIAGNOSIS — M8588 Other specified disorders of bone density and structure, other site: Secondary | ICD-10-CM | POA: Diagnosis not present

## 2021-01-29 DIAGNOSIS — J449 Chronic obstructive pulmonary disease, unspecified: Secondary | ICD-10-CM | POA: Insufficient documentation

## 2021-01-29 DIAGNOSIS — W19XXXA Unspecified fall, initial encounter: Secondary | ICD-10-CM

## 2021-01-29 DIAGNOSIS — Z87891 Personal history of nicotine dependence: Secondary | ICD-10-CM | POA: Insufficient documentation

## 2021-01-29 DIAGNOSIS — M545 Low back pain, unspecified: Secondary | ICD-10-CM | POA: Diagnosis not present

## 2021-01-29 DIAGNOSIS — R52 Pain, unspecified: Secondary | ICD-10-CM | POA: Diagnosis not present

## 2021-01-29 DIAGNOSIS — R6889 Other general symptoms and signs: Secondary | ICD-10-CM | POA: Diagnosis not present

## 2021-01-29 DIAGNOSIS — M79662 Pain in left lower leg: Secondary | ICD-10-CM | POA: Diagnosis not present

## 2021-01-29 DIAGNOSIS — M79605 Pain in left leg: Secondary | ICD-10-CM

## 2021-01-29 DIAGNOSIS — W010XXA Fall on same level from slipping, tripping and stumbling without subsequent striking against object, initial encounter: Secondary | ICD-10-CM | POA: Diagnosis not present

## 2021-01-29 DIAGNOSIS — E1122 Type 2 diabetes mellitus with diabetic chronic kidney disease: Secondary | ICD-10-CM | POA: Diagnosis not present

## 2021-01-29 DIAGNOSIS — N184 Chronic kidney disease, stage 4 (severe): Secondary | ICD-10-CM | POA: Diagnosis not present

## 2021-01-29 DIAGNOSIS — N186 End stage renal disease: Secondary | ICD-10-CM | POA: Diagnosis not present

## 2021-01-29 DIAGNOSIS — R0902 Hypoxemia: Secondary | ICD-10-CM | POA: Diagnosis not present

## 2021-01-29 DIAGNOSIS — Z992 Dependence on renal dialysis: Secondary | ICD-10-CM | POA: Diagnosis not present

## 2021-01-29 NOTE — ED Notes (Signed)
Xray at bedside. Xray tech noticed a wound on left foot when sock removed.

## 2021-01-29 NOTE — ED Provider Notes (Signed)
Emergency Medicine Provider Triage Evaluation Note  Jody [name] , a 80 y.o. female  was evaluated in triage.  Pt complains of pain in left lower leg.  She fell earlier tonight, initially declined EMS transport then later called them back and was transported.  She states that she hurt her lower back and left lower leg in the fall.  Review of Systems  Positive: Pain back and left lower leg Negative: No headache, neck pain or upper back pain  Physical Exam  BP 122/75 (BP Location: Left Arm)    Pulse (!) 58    Temp 97.9 F (36.6 C) (Oral)    Resp 18    Ht 5\' 7"  (1.702 m)    Wt 84 kg    SpO2 99%    BMI 29.00 kg/m  Gen:   Awake, no distress   Resp:  Normal effort  MSK:   Moves extremities without difficulty difficulty moving left leg secondary to pain in the left anterior lower tibia Other:  Tender lumbar area without palpable step-off of the lumbar spine  Medical Decision Making  Medically screening exam initiated at 10:45 PM.  Appropriate orders placed.  Jody [name] was informed that the remainder of the evaluation will be completed by another provider, this initial triage assessment does not replace that evaluation, and the importance of remaining in the ED until their evaluation is complete.  X-rays ordered   Jody Bo, MD 01/29/21 2252

## 2021-01-29 NOTE — ED Triage Notes (Addendum)
Pt fell at home approximately 2 hrs ago. EMS was called but pt declined treatment at that time. Pt then decided that she was in pain and wanted to be evaluated. Pt c/o pain to L lower leg (pt wrapped in tape) as well as increased back pain.  Pt took Tylenol approximately 1 hr PTA.

## 2021-01-30 ENCOUNTER — Emergency Department (HOSPITAL_COMMUNITY): Payer: Medicare Other

## 2021-01-30 DIAGNOSIS — M25552 Pain in left hip: Secondary | ICD-10-CM | POA: Diagnosis not present

## 2021-01-30 DIAGNOSIS — M79605 Pain in left leg: Secondary | ICD-10-CM | POA: Diagnosis not present

## 2021-01-30 MED ORDER — ACETAMINOPHEN 500 MG PO TABS
1000.0000 mg | ORAL_TABLET | Freq: Once | ORAL | Status: AC
  Administered 2021-01-30: 02:00:00 1000 mg via ORAL
  Filled 2021-01-30: qty 2

## 2021-01-30 NOTE — ED Notes (Addendum)
Patient transported to XRAY 

## 2021-01-30 NOTE — ED Notes (Signed)
Spoke with [name] D. (POA) regarding pt status and who would be taking her home. [name] lives out of town and gave me names of relatives that live close by that can help.

## 2021-01-30 NOTE — ED Notes (Signed)
Called [name] D. (POA) to discuss other relatives not answering the phone and to educate that EMS was called to take pt safely back home. [name] D did not answer the phone and left message

## 2021-01-30 NOTE — Discharge Instructions (Signed)
You were evaluated in the Emergency Department and after careful evaluation, we did not find any emergent condition requiring admission or further testing in the hospital.  Your exam/testing today was overall reassuring.  Symptoms likely due to bruising from the fall.  Please return to the Emergency Department if you experience any worsening of your condition.  Thank you for allowing [place] to be a part of your care.

## 2021-01-30 NOTE — ED Provider Notes (Signed)
[facility] Emergency Department Provider Note MRN:  [id number]  Arrival date & time: 01/30/21     Chief Complaint   Fall   History of Present Illness   Jody [name] is a 80 y.o. year-old female with a history of diabetes, stroke, ESRD presenting to the ED with chief complaint of fall.  Patient explains that she tripped and fell earlier today.  Endorsing pain to the left leg.  Pain is mild to moderate, constant, worse with motion and palpation.  Denies head trauma, no loss of consciousness.  No neck or back pain, no chest pain or shortness of breath, no abdominal pain.  Review of Systems  A complete 10 system review of systems was obtained and all systems are negative except as noted in the HPI and PMH.   Patient's Health History    Past Medical History:  Diagnosis Date   Anemia in chronic kidney disease (CODE) 06/05/2015   Arthritis    "bad in my legs" (12/07/2014)   Chronic atrial fibrillation ([place]) 06/05/2015   Chronic back pain    "dr said my spine is crooked"   Chronic bronchitis ([place])    "get it q yr" (12/07/2014)   CKD stage 4 due to type 2 diabetes mellitus ([place]) 07/20/3014   Complication of anesthesia    headache for 2 days after surgery in May 2019   COPD (chronic obstructive pulmonary disease) ([place])    Gait difficulty 09/02/2014   Hypercholesterolemia    Hypertension    Neuropathy 2015   in legs   NSVT (nonsustained ventricular tachycardia)    Sinus brady-tachy syndrome ([place])    Stroke ([place]) 05/2014   denies residual on 12/07/2014   Type II diabetes mellitus ([place])     Past Surgical History:  Procedure Laterality Date   ABDOMINAL HYSTERECTOMY  ~ [place]  ~ [place] Right 07/06/2017   Procedure: RIGHT RADIOCEPHALIC ARTERIOVENOUS FISTULA creation;  Surgeon: Conrad [place], MD;  Location: [place];  Service: Vascular;  Laterality: Right;   [place] Right 09/17/2017   Procedure: SECOND STAGE  BASILIC VEIN TRANSPOSITION RIGHT ARM;  Surgeon: Rosetta Posner, MD;  Location: [facility] OR;  Service: Vascular;  Laterality: Right;   COLONOSCOPY N/A 10/28/2017   Procedure: COLONOSCOPY;  Surgeon: Rogene Houston, MD;  [facility];  Service: Endoscopy;  Laterality: N/A;   COLONOSCOPY WITH PROPOFOL N/A 12/29/2019   Procedure: COLONOSCOPY WITH PROPOFOL;  Surgeon: Eloise Harman, DO;  [facility];  Service: Endoscopy;  Laterality: N/A;   ESOPHAGOGASTRODUODENOSCOPY (EGD) WITH PROPOFOL N/A 10/31/2017   Procedure: ESOPHAGOGASTRODUODENOSCOPY (EGD) WITH PROPOFOL;  Surgeon: Rogene Houston, MD;  [facility];  Service: Endoscopy;  Laterality: N/A;   JOINT REPLACEMENT     TOTAL KNEE ARTHROPLASTY Right ~ 1986   TUMOR EXCISION  ~ 1970   "9# in my stomach"    Family History  Problem Relation Age of Onset   Cancer Other    Heart attack Brother    Cancer Sister        breast   Alcohol abuse Brother     Social History   Socioeconomic History   Marital status: Widowed    Spouse name: Not on file   Number of children: 0   Years of education: 9   Highest education level: Not on file  Occupational History   Occupation: retired  Tobacco Use   Smoking status: Former  Packs/day: 0.50    Years: 50.00    Pack years: 25.00    Types: Cigarettes    Start date: 03/07/1953    Quit date: 01/14/2004    Years since quitting: 17.0   Smokeless tobacco: Never   Tobacco comments:    "quit smoking cigarettes  in ~ 2005"  Vaping Use   Vaping Use: Never used  Substance and Sexual Activity   Alcohol use: No    Alcohol/week: 0.0 standard drinks    Comment: 10/242016 "quit drinking beer in ~ 1977"   Drug use: No   Sexual activity: Never  Other Topics Concern   Not on file  Social History Narrative   Patient drinks caffeine a few times a week.   Patient is right handed.   Social Determinants of Health   Financial Resource Strain: Not on file  Food Insecurity: Not on file   Transportation Needs: Not on file  Physical Activity: Not on file  Stress: Not on file  Social Connections: Not on file  Intimate Partner Violence: Not on file     Physical Exam   Vitals:   01/30/21 0138 01/30/21 0200  BP:  118/71  Pulse:    Resp:  14  Temp:    SpO2: 99% 99%    CONSTITUTIONAL: Chronically ill-appearing, NAD NEURO:  Alert and oriented x 3, no focal deficits EYES:  eyes equal and reactive ENT/NECK:  no LAD, no JVD CARDIO: Regular rate, well-perfused, normal S1 and S2 PULM:  CTAB no wheezing or rhonchi GI/GU:  normal bowel sounds, non-distended, non-tender MSK/SPINE:  No gross deformities, no edema; tenderness to palpation of the left lower leg SKIN:  no rash, atraumatic PSYCH:  Appropriate speech and behavior  *Additional and/or pertinent findings included in MDM below  Diagnostic and Interventional Summary    EKG Interpretation  Date/Time:    Ventricular Rate:    PR Interval:    QRS Duration:   QT Interval:    QTC Calculation:   R Axis:     Text Interpretation:         Labs Reviewed - No data to display  DG Hip Unilat W or Wo Pelvis 2-3 Views Left  Final Result    DG Femur Min 2 Views Left  Final Result    DG Lumbar Spine 2-3 Views  Final Result    DG Tibia/Fibula Left  Final Result      Medications  acetaminophen (TYLENOL) tablet 1,000 mg (1,000 mg Oral Given 01/30/21 0136)     Procedures  /  Critical Care Procedures  ED Course and Medical Decision Making  I have reviewed the triage vital signs, the nursing notes, and pertinent available records from the EMR.  Listed above are laboratory and imaging tests that I personally ordered, reviewed, and interpreted and then considered in my medical decision making (see below for details).  Mechanical fall with left leg pain.  Anticoagulated but no head trauma, no loss consciousness, no evidence of head trauma on exam.  Exam in total with is largely nontraumatic.  There is some  tenderness noted with palpation of the left shin, however the range of motion of the left leg is largely intact.  X-ray imaging is negative, patient is appropriate for discharge.       Barth Kirks. [place] Small, [place] [email or web address]  Final Clinical Impressions(s) / ED Diagnoses     ICD-10-CM   1. Fall, initial encounter  W19.Merril Abbe  2. Pain of left lower extremity  M79.605       ED Discharge Orders     None        Discharge Instructions Discussed with and Provided to Patient:    Discharge Instructions      You were evaluated in the Emergency Department and after careful evaluation, we did not find any emergent condition requiring admission or further testing in the hospital.  Your exam/testing today was overall reassuring.  Symptoms likely due to bruising from the fall.  Please return to the Emergency Department if you experience any worsening of your condition.  Thank you for allowing [place] to be a part of your care.        Maudie Flakes, MD 01/30/21 [phone]

## 2021-01-30 NOTE — ED Notes (Signed)
Talked to pt's niece PATTI, states she'll come and get the patient and will be here in about 20-77mins.

## 2021-02-01 ENCOUNTER — Emergency Department (HOSPITAL_COMMUNITY): Payer: Medicare Other

## 2021-02-01 ENCOUNTER — Other Ambulatory Visit: Payer: Self-pay

## 2021-02-01 ENCOUNTER — Emergency Department (HOSPITAL_COMMUNITY)
Admission: EM | Admit: 2021-02-01 | Discharge: 2021-02-01 | Disposition: A | Discharge status: Home / Self Care | Payer: Medicare Other | Attending: Emergency Medicine | Admitting: Emergency Medicine

## 2021-02-01 ENCOUNTER — Encounter (HOSPITAL_COMMUNITY): Payer: Self-pay | Admitting: Emergency Medicine

## 2021-02-01 DIAGNOSIS — S46912A Strain of unspecified muscle, fascia and tendon at shoulder and upper arm level, left arm, initial encounter: Secondary | ICD-10-CM | POA: Insufficient documentation

## 2021-02-01 DIAGNOSIS — Z79899 Other long term (current) drug therapy: Secondary | ICD-10-CM | POA: Diagnosis not present

## 2021-02-01 DIAGNOSIS — M79604 Pain in right leg: Secondary | ICD-10-CM | POA: Diagnosis not present

## 2021-02-01 DIAGNOSIS — Z7901 Long term (current) use of anticoagulants: Secondary | ICD-10-CM | POA: Insufficient documentation

## 2021-02-01 DIAGNOSIS — N184 Chronic kidney disease, stage 4 (severe): Secondary | ICD-10-CM | POA: Diagnosis not present

## 2021-02-01 DIAGNOSIS — Z87891 Personal history of nicotine dependence: Secondary | ICD-10-CM | POA: Diagnosis not present

## 2021-02-01 DIAGNOSIS — S4991XA Unspecified injury of right shoulder and upper arm, initial encounter: Secondary | ICD-10-CM | POA: Diagnosis present

## 2021-02-01 DIAGNOSIS — S46911A Strain of unspecified muscle, fascia and tendon at shoulder and upper arm level, right arm, initial encounter: Secondary | ICD-10-CM | POA: Insufficient documentation

## 2021-02-01 DIAGNOSIS — M542 Cervicalgia: Secondary | ICD-10-CM | POA: Diagnosis not present

## 2021-02-01 DIAGNOSIS — J449 Chronic obstructive pulmonary disease, unspecified: Secondary | ICD-10-CM | POA: Diagnosis not present

## 2021-02-01 DIAGNOSIS — M25551 Pain in right hip: Secondary | ICD-10-CM | POA: Diagnosis not present

## 2021-02-01 DIAGNOSIS — Z96651 Presence of right artificial knee joint: Secondary | ICD-10-CM | POA: Diagnosis not present

## 2021-02-01 DIAGNOSIS — R519 Headache, unspecified: Secondary | ICD-10-CM | POA: Insufficient documentation

## 2021-02-01 DIAGNOSIS — S46002A Unspecified injury of muscle(s) and tendon(s) of the rotator cuff of left shoulder, initial encounter: Secondary | ICD-10-CM | POA: Diagnosis not present

## 2021-02-01 DIAGNOSIS — M25511 Pain in right shoulder: Secondary | ICD-10-CM | POA: Insufficient documentation

## 2021-02-01 DIAGNOSIS — S0990XA Unspecified injury of head, initial encounter: Secondary | ICD-10-CM | POA: Diagnosis not present

## 2021-02-01 DIAGNOSIS — M4186 Other forms of scoliosis, lumbar region: Secondary | ICD-10-CM | POA: Diagnosis not present

## 2021-02-01 DIAGNOSIS — E1122 Type 2 diabetes mellitus with diabetic chronic kidney disease: Secondary | ICD-10-CM | POA: Diagnosis not present

## 2021-02-01 DIAGNOSIS — I5032 Chronic diastolic (congestive) heart failure: Secondary | ICD-10-CM | POA: Diagnosis not present

## 2021-02-01 DIAGNOSIS — I130 Hypertensive heart and chronic kidney disease with heart failure and stage 1 through stage 4 chronic kidney disease, or unspecified chronic kidney disease: Secondary | ICD-10-CM | POA: Diagnosis not present

## 2021-02-01 DIAGNOSIS — W06XXXA Fall from bed, initial encounter: Secondary | ICD-10-CM | POA: Insufficient documentation

## 2021-02-01 DIAGNOSIS — E1121 Type 2 diabetes mellitus with diabetic nephropathy: Secondary | ICD-10-CM | POA: Diagnosis not present

## 2021-02-01 DIAGNOSIS — S79911A Unspecified injury of right hip, initial encounter: Secondary | ICD-10-CM | POA: Diagnosis not present

## 2021-02-01 DIAGNOSIS — M47816 Spondylosis without myelopathy or radiculopathy, lumbar region: Secondary | ICD-10-CM | POA: Diagnosis not present

## 2021-02-01 MED ORDER — ACETAMINOPHEN 325 MG PO TABS
650.0000 mg | ORAL_TABLET | Freq: Once | ORAL | Status: AC
  Administered 2021-02-01: 17:00:00 650 mg via ORAL
  Filled 2021-02-01: qty 2

## 2021-02-01 NOTE — ED Notes (Signed)
This RN expressed concern of discharging pt at home by self to Dr. Ashok Cordia. Pt lives by herself, uses a walker and unsteady gait. Per pt's niece Precious Bard, pt lives by herself and her cousin comes by to give pt's insulin. Informed Dr. Ashok Cordia that 2 days ago, this RN dcd this patient with 2-staff assist and pt was very unsteady. Dr. Ashok Cordia asked for pt's cousin information.

## 2021-02-01 NOTE — ED Provider Notes (Signed)
[facility] EMERGENCY DEPARTMENT Provider Note   CSN: [id number] Arrival date & time: 02/01/21  1156     History Chief Complaint  Patient presents with   [name]    Jody [name] is a 80 y.o. female.  Pt s/p fall from bed last night. States slid out of bed to floor. No faintness or dizziness. No syncope or loc. C/o bilateral shoulder pain and right hip pain post fall, dull, moderate, worse w movement. Limited mobility at baseline, states has home health helper. +Hx esrd/hd, and is on anticoag therapy. Hit head. No head pain. No neck/back pain. No chest pain or discomfort. No sob. No abd pain or nvd. No gu c/o. Denies other extremity pain or injury. Skin is intact. Pt limited historian - level 5 caveat.   The history is provided by the patient and medical records. The history is limited by the condition of the patient.      Past Medical History:  Diagnosis Date   Anemia in chronic kidney disease (CODE) 06/05/2015   Arthritis    "bad in my legs" (12/07/2014)   Chronic atrial fibrillation ([place]) 06/05/2015   Chronic back pain    "dr said my spine is crooked"   Chronic bronchitis ([place])    "get it q yr" (12/07/2014)   CKD stage 4 due to type 2 diabetes mellitus ([place]) 9/56/2130   Complication of anesthesia    headache for 2 days after surgery in May 2019   COPD (chronic obstructive pulmonary disease) ([place])    Gait difficulty 09/02/2014   Hypercholesterolemia    Hypertension    Neuropathy 2015   in legs   NSVT (nonsustained ventricular tachycardia)    Sinus brady-tachy syndrome ([place])    Stroke ([place]) 05/2014   denies residual on 12/07/2014   Type II diabetes mellitus ([place])     Patient Active Problem List   Diagnosis Date Noted   Rectal bleeding 12/27/2019   Acute GI bleeding 12/27/2019   NSVT (nonsustained ventricular tachycardia) 10/27/2019   Acute respiratory failure with hypoxia ([place]) 10/26/2019   Megaloblastic anemia 10/26/2019   Chronic blood loss anemia 10/26/2017    Arthritis 10/26/2017   Acute on chronic blood loss anemia 10/26/2017   Iron deficiency anemia 02/23/2017   Chronic diastolic CHF (congestive heart failure) ([place]) 10/15/2016   Elevated troponin 10/15/2016   Type 2 diabetes mellitus with nephropathy ([place]) 10/15/2016   ESRD on dialysis ([place]) 10/15/2016   NSTEMI (non-ST elevated myocardial infarction) ([place]) 10/03/2015   Elevated LFTs 10/03/2015   Encounter for therapeutic drug monitoring 07/29/2015   COPD (chronic obstructive pulmonary disease) ([place]) 06/05/2015   CKD stage 4 due to type 2 diabetes mellitus ([place]) 06/05/2015   Anemia in chronic kidney disease, Stage IV 86/57/8469   Acute diastolic HF (heart failure) ([place]) 06/05/2015   Atrial fibrillation ([place]) 06/05/2015   Atrial fibrillation with rapid ventricular response ([place]) 12/07/2014   TIA (transient ischemic attack) 09/02/2014   Gait difficulty 09/02/2014   Other specified transient cerebral ischemias    Stroke with cerebral ischemia [facility])    Hemispheric carotid artery syndrome    Essential hypertension    Hyperlipidemia    Type 2 diabetes mellitus with other circulatory complications ([place])    Cerebral infarction ([place]) 05/27/2014    Past Surgical History:  Procedure Laterality Date   ABDOMINAL HYSTERECTOMY  ~ 1970   APPENDECTOMY  ~ [place] Right 07/06/2017   Procedure: RIGHT RADIOCEPHALIC ARTERIOVENOUS FISTULA creation;  Surgeon: Adele Barthel  L, MD;  Location: [facility];  Service: Vascular;  Laterality: Right;   [place] Right 09/17/2017   Procedure: SECOND STAGE BASILIC VEIN TRANSPOSITION RIGHT ARM;  Surgeon: Rosetta Posner, MD;  Location: [facility];  Service: Vascular;  Laterality: Right;   COLONOSCOPY N/A 10/28/2017   Procedure: COLONOSCOPY;  Surgeon: Rogene Houston, MD;  [facility];  Service: Endoscopy;  Laterality: N/A;   COLONOSCOPY WITH PROPOFOL N/A 12/29/2019   Procedure: COLONOSCOPY WITH PROPOFOL;  Surgeon: Eloise Harman,  DO;  [facility];  Service: Endoscopy;  Laterality: N/A;   ESOPHAGOGASTRODUODENOSCOPY (EGD) WITH PROPOFOL N/A 10/31/2017   Procedure: ESOPHAGOGASTRODUODENOSCOPY (EGD) WITH PROPOFOL;  Surgeon: Rogene Houston, MD;  [facility];  Service: Endoscopy;  Laterality: N/A;   JOINT REPLACEMENT     TOTAL KNEE ARTHROPLASTY Right ~ [place]  ~ 1970   "9# in my stomach"     OB History   No obstetric history on file.     Family History  Problem Relation Age of Onset   Cancer Other    Heart attack Brother    Cancer Sister        breast   Alcohol abuse Brother     Social History   Tobacco Use   Smoking status: Former    Packs/day: 0.50    Years: 50.00    Pack years: 25.00    Types: Cigarettes    Start date: 03/07/1953    Quit date: 01/14/2004    Years since quitting: 17.0   Smokeless tobacco: Never   Tobacco comments:    "quit smoking cigarettes  in ~ 2005"  Vaping Use   Vaping Use: Never used  Substance Use Topics   Alcohol use: No    Alcohol/week: 0.0 standard drinks    Comment: 10/242016 "quit drinking beer in ~ 1977"   Drug use: No    Home Medications Prior to Admission medications   Medication Sig Start Date End Date Taking? Authorizing Provider  albuterol (ACCUNEB) 0.63 MG/3ML nebulizer solution Take 1 ampule by nebulization every 6 (six) hours as needed for wheezing.    [provider]  albuterol (PROVENTIL HFA;VENTOLIN HFA) 108 (90 BASE) MCG/ACT inhaler Inhale 1-2 puffs into the lungs every 6 (six) hours as needed for wheezing or shortness of breath. Reported on 03/01/2015    [provider]  apixaban (ELIQUIS) 2.5 MG TABS tablet Take by mouth 2 (two) times daily.    [provider]  atorvastatin (LIPITOR) 40 MG tablet Take 1 tablet by mouth at bedtime.  01/22/16   [provider]  diltiazem (TIAZAC) 120 MG 24 hr capsule Take 120 mg by mouth daily.    [provider]  fluticasone-salmeterol  (ADVAIR HFA) 230-21 MCG/ACT inhaler Inhale 2 puffs into the lungs 2 (two) times daily.    [provider]  furosemide (LASIX) 40 MG tablet Take 40 mg by mouth.    [provider]  gabapentin (NEURONTIN) 100 MG capsule Take 100 mg by mouth 3 (three) times daily.    [provider]  magnesium hydroxide (MILK OF MAGNESIA) 400 MG/5ML suspension Take 15 mLs by mouth daily as needed for mild constipation.    [provider]  metoprolol tartrate (LOPRESSOR) 25 MG tablet Take 0.5 tablets (12.5 mg total) by mouth 2 (two) times daily. Patient not taking: Reported on 01/12/2021 10/18/16   Kathie Dike, MD  pantoprazole (PROTONIX) 40 MG tablet Take 1 tablet (  40 mg total) by mouth daily. 12/31/19 12/30/20  Manuella Ghazi, Pratik D, DO  pramipexole (MIRAPEX) 0.125 MG tablet Take 0.125 mg by mouth daily.    [provider]  sevelamer carbonate (RENVELA) 800 MG tablet Take 800 mg by mouth 3 (three) times daily with meals.  07/22/19   [provider]  sodium bicarbonate 650 MG tablet Take 1 tablet (650 mg total) by mouth 2 (two) times daily. Patient not taking: Reported on 01/12/2021 10/17/16   TatShanon Brow, MD  TRESIBA FLEXTOUCH 100 UNIT/ML FlexTouch Pen Inject 12 Units into the skin daily in the afternoon.  Patient not taking: Reported on 01/12/2021 07/23/19   [provider]    Allergies    Codeine  Review of Systems   Review of Systems  Constitutional:  Negative for chills and fever.  HENT:  Negative for nosebleeds.   Eyes:  Negative for pain and visual disturbance.  Respiratory:  Negative for cough and shortness of breath.   Cardiovascular:  Negative for chest pain.  Gastrointestinal:  Negative for abdominal pain, nausea and vomiting.  Genitourinary:  Negative for flank pain.  Musculoskeletal:  Negative for back pain and neck pain.  Skin:  Negative for wound.  Neurological:  Negative for weakness, numbness and headaches.  Hematological:         +anticoag use.   Psychiatric/Behavioral:  Negative for agitation.    Physical Exam Updated Vital Signs BP 134/84 (BP Location: Left Arm)    Pulse 95    Temp 99.5 F (37.5 C) (Oral)    Resp 18    Ht 1.702 m (5\' 7" )    Wt 83.9 kg    SpO2 95%    BMI 28.98 kg/m   Physical Exam Vitals and nursing note reviewed.  Constitutional:      Appearance: Normal appearance. She is well-developed.  HENT:     Head:     Comments: Tenderness scalp ?contusion    Nose: Nose normal.     Mouth/Throat:     Mouth: Mucous membranes are moist.  Eyes:     General: No scleral icterus.    Conjunctiva/sclera: Conjunctivae normal.     Pupils: Pupils are equal, round, and reactive to light.  Neck:     Vascular: No carotid bruit.     Trachea: No tracheal deviation.     Comments: Mild mid cervical tenderness.  Cardiovascular:     Rate and Rhythm: Normal rate and regular rhythm.     Pulses: Normal pulses.     Heart sounds: Normal heart sounds. No murmur heard.   No friction rub. No gallop.  Pulmonary:     Effort: Pulmonary effort is normal. No respiratory distress.     Breath sounds: Normal breath sounds.  Chest:     Chest wall: No tenderness.  Abdominal:     General: Bowel sounds are normal. There is no distension.     Palpations: Abdomen is soft.     Tenderness: There is no abdominal tenderness. There is no guarding.     Comments: No abd bruising or contusion noted.   Genitourinary:    Comments: No cva tenderness.  Musculoskeletal:        General: No swelling.     Cervical back: Normal range of motion and neck supple. No rigidity. No muscular tenderness.     Comments: Mild mid cervical tenderness, otherwise, CTLS spine, non tender, aligned, no step off. Tenderness right hip, otherwise good rom bilateral extremities without pain or focal bony tenderness.  Distal pulses palp.   Skin:    General: Skin is warm and dry.     Findings: No rash.  Neurological:     Mental Status: She is alert.     Comments:  Alert, speech normal. GCS 15. Motor/sens grossly intact bil  Psychiatric:        Mood and Affect: Mood normal.    ED Results / Procedures / Treatments   Labs (all labs ordered are listed, but only abnormal results are displayed) Labs Reviewed - No data to display  EKG None  Radiology DG Shoulder Right  Result Date: 02/01/2021 CLINICAL DATA:  Slipped out of bed last night, complaining of RIGHT shoulder pain, LEFT shoulder pain, RIGHT leg, and back pain EXAM: RIGHT SHOULDER - 2+ VIEW COMPARISON:  01/25/2021 FINDINGS: Osseous demineralization. Degenerative changes of RIGHT AC joint with joint space narrowing and spur formation. Mild superior subluxation of the RIGHT humeral head approximating undersurface of acromion, question chronic rotator cuff tear. Visualized ribs intact. No acute fracture, dislocation, or bone destruction. IMPRESSION: Degenerative changes RIGHT AC joint with osseous demineralization and question chronic rotator cuff tear. No acute abnormalities. Electronically Signed   By: Lavonia Dana M.D.   On: 02/01/2021 16:23   CT HEAD WO CONTRAST (5MM)  Result Date: 02/01/2021 CLINICAL DATA:  Head trauma, minor (Age >= 65y); Neck trauma (Age >= 65y). Pt states she fell out of bed and couldn't get up today. Denies hitting head, denies neck pain EXAM: CT HEAD WITHOUT CONTRAST CT CERVICAL SPINE WITHOUT CONTRAST TECHNIQUE: Multidetector CT imaging of the head and cervical spine was performed following the standard protocol without intravenous contrast. Multiplanar CT image reconstructions of the cervical spine were also generated. COMPARISON:  None. FINDINGS: CT HEAD FINDINGS BRAIN: BRAIN Cerebral ventricle sizes are concordant with the degree of cerebral volume loss. Patchy and confluent areas of decreased attenuation are noted throughout the deep and periventricular white matter of the cerebral hemispheres bilaterally, compatible with chronic microvascular ischemic disease. Chronic left  occipital infarction. No evidence of large-territorial acute infarction. No parenchymal hemorrhage. No mass lesion. No extra-axial collection. No mass effect or midline shift. No hydrocephalus. Basilar cisterns are patent. Vascular: No hyperdense vessel. Atherosclerotic calcifications are present within the cavernous internal carotid arteries. Skull: No acute fracture or focal lesion. Sinuses/Orbits: Paranasal sinuses and mastoid air cells are clear. Bilateral lens replacement. Otherwise the orbits are unremarkable. Other: None. CT CERVICAL SPINE FINDINGS Alignment: Normal. Skull base and vertebrae: Multilevel severe degenerative changes of the spine most prominent at the C4-C5 levels with associated multilevel severe osseous neural foraminal stenosis. At least C4 mild to moderate and at least C5 moderate osseous central canal stenosis. No acute fracture. No aggressive appearing focal osseous lesion or focal pathologic process. Soft tissues and spinal canal: No prevertebral fluid or swelling. No visible canal hematoma. Upper chest: Unremarkable. Other: Carotid artery calcifications within the neck. IMPRESSION: 1. No acute intracranial abnormality. 2. No acute displaced fracture or traumatic listhesis of the cervical spine. 3. Multilevel severe degenerative changes of the spine most prominent at the C4-C5 levels with associated multilevel severe osseous neural foraminal stenosis. At least C4 mild to moderate and at least C5 moderate osseous central canal stenosis. Electronically Signed   By: Iven Finn M.D.   On: 02/01/2021 15:54   CT CERVICAL SPINE WO CONTRAST  Result Date: 02/01/2021 CLINICAL DATA:  Head trauma, minor (Age >= 65y); Neck trauma (Age >= 65y). Pt states she fell out of bed and couldn't get  up today. Denies hitting head, denies neck pain EXAM: CT HEAD WITHOUT CONTRAST CT CERVICAL SPINE WITHOUT CONTRAST TECHNIQUE: Multidetector CT imaging of the head and cervical spine was performed following  the standard protocol without intravenous contrast. Multiplanar CT image reconstructions of the cervical spine were also generated. COMPARISON:  None. FINDINGS: CT HEAD FINDINGS BRAIN: BRAIN Cerebral ventricle sizes are concordant with the degree of cerebral volume loss. Patchy and confluent areas of decreased attenuation are noted throughout the deep and periventricular white matter of the cerebral hemispheres bilaterally, compatible with chronic microvascular ischemic disease. Chronic left occipital infarction. No evidence of large-territorial acute infarction. No parenchymal hemorrhage. No mass lesion. No extra-axial collection. No mass effect or midline shift. No hydrocephalus. Basilar cisterns are patent. Vascular: No hyperdense vessel. Atherosclerotic calcifications are present within the cavernous internal carotid arteries. Skull: No acute fracture or focal lesion. Sinuses/Orbits: Paranasal sinuses and mastoid air cells are clear. Bilateral lens replacement. Otherwise the orbits are unremarkable. Other: None. CT CERVICAL SPINE FINDINGS Alignment: Normal. Skull base and vertebrae: Multilevel severe degenerative changes of the spine most prominent at the C4-C5 levels with associated multilevel severe osseous neural foraminal stenosis. At least C4 mild to moderate and at least C5 moderate osseous central canal stenosis. No acute fracture. No aggressive appearing focal osseous lesion or focal pathologic process. Soft tissues and spinal canal: No prevertebral fluid or swelling. No visible canal hematoma. Upper chest: Unremarkable. Other: Carotid artery calcifications within the neck. IMPRESSION: 1. No acute intracranial abnormality. 2. No acute displaced fracture or traumatic listhesis of the cervical spine. 3. Multilevel severe degenerative changes of the spine most prominent at the C4-C5 levels with associated multilevel severe osseous neural foraminal stenosis. At least C4 mild to moderate and at least C5  moderate osseous central canal stenosis. Electronically Signed   By: Iven Finn M.D.   On: 02/01/2021 15:54   DG Shoulder Left  Result Date: 02/01/2021 CLINICAL DATA:  Slipped out of bed last night, complaining of RIGHT shoulder pain, LEFT shoulder pain, RIGHT leg, and back pain EXAM: LEFT SHOULDER - 2+ VIEW COMPARISON:  10/31/2020 FINDINGS: Osseous demineralization. AC joint alignment normal. Question old healed fracture of the posterolateral LEFT third rib. No glenohumeral fracture, dislocation, or bone destruction. IMPRESSION: No acute osseous abnormalities. Electronically Signed   By: Lavonia Dana M.D.   On: 02/01/2021 16:21   DG HIP UNILAT W OR W/O PELVIS 2-3 VIEWS RIGHT  Result Date: 02/01/2021 CLINICAL DATA:  Slipped out of bed last night, complaining of RIGHT shoulder pain, LEFT shoulder pain, RIGHT leg, and back pain EXAM: DG HIP (WITH OR WITHOUT PELVIS) 2-3V RIGHT COMPARISON:  None FINDINGS: Osseous demineralization. Multilevel degenerative disc disease changes lumbar spine with disc space narrowing, endplate spur formation, and accompanying scoliosis. Hip and SI joint spaces preserved. No acute fracture, dislocation, or bone destruction. Atherosclerotic calcifications aorta, iliac arteries, femoral arteries. IMPRESSION: No acute osseous abnormalities. Degenerative disc disease changes lumbar spine. Electronically Signed   By: Lavonia Dana M.D.   On: 02/01/2021 16:20    Procedures Procedures   Medications Ordered in ED Medications  acetaminophen (TYLENOL) tablet 650 mg (650 mg Oral Given 02/01/21 1639)    ED Course  I have reviewed the triage vital signs and the nursing notes.  Pertinent labs & imaging results that were available during my care of the patient were reviewed by me and considered in my medical decision making (see chart for details).    MDM Rules/Calculators/A&P  Imaging studies ordered.  Reviewed nursing notes and prior charts for  additional history.  Recent xrays and labs reviewed - hx CKD/ESRD.   Xrays reviewed/interpreted by me - no fx.  CT reviewed/interpreted by me - no hem.  Acetaminophen po.   Discussed home situation with patient - patient indicates family helps her at home, and feels ok/ready to go home.  Given recent other eval for fall, limited mobility at baseline, will also make home health face to face referral to try to maximize assistance/home health services.   Pt currently appears stable for d/c.   Return precautions provided.     Final Clinical Impression(s) / ED Diagnoses Final diagnoses:  Fall from bed, initial encounter  Shoulder strain, left, initial encounter  Strain of right shoulder, initial encounter    Rx / DC Orders ED Discharge Orders          [place]        02/01/21 1713    Face-to-face encounter (required for Medicare/Medicaid patients)       Comments: I Mirna Mires certify that this patient is under my care and that I, or a nurse practitioner or physician's assistant working with me, had a face-to-face encounter that meets the physician face-to-face encounter requirements with this patient on 02/01/2021. The encounter with the patient was in whole, or in part for the following medical condition(s) which is the primary reason for home health care (List medical condition): physical deconditioning, falls, esrd/hd   02/01/21 1713             Lajean Saver, MD 02/01/21 1715

## 2021-02-01 NOTE — Discharge Instructions (Addendum)
It was our pleasure to provide your ER care today - we hope that you feel better.  Fall precautions - use great care/caution, and/or assistance to help minimize risk of falling.   Drink  adequate fluids/stay well hydrated. Take acetaminophen as need.  Follow up closely with primary care doctor in the next week.   Return to ER if worse, new symptoms, fevers, new/severe pain, trouble breathing, or other concern.

## 2021-02-01 NOTE — ED Notes (Signed)
Called pt's niece Precious Bard re: discharge. She said she'll be here to take pt home around 5:30PM.

## 2021-02-01 NOTE — ED Triage Notes (Signed)
Pt BIB EMS from home/ lives by herself c/o "slipped out of bed", happened last night. Complaining of rt shoulder pain, rt leg pain and entire back pain. Pt denies hitting head or passing out and insisted that she "only slipped out of bed and cannot get back up". Pt normally on 3LNC but not on O2 on EMS arrival, EMS placed pt on [facility] ?75% questionable per EMS as their "monitor doesn't work sometimes". Pt was also here 2 days ago for fall/leg pain. Pt had dialysis yesterday. Inocente Salles (friend) [phone] provided by EMS.   VS per ems: 139/103 111  100% on 3LNC on ER arrival.

## 2021-02-04 DIAGNOSIS — N186 End stage renal disease: Secondary | ICD-10-CM | POA: Diagnosis not present

## 2021-02-04 DIAGNOSIS — Z992 Dependence on renal dialysis: Secondary | ICD-10-CM | POA: Diagnosis not present

## 2021-02-05 DIAGNOSIS — Z992 Dependence on renal dialysis: Secondary | ICD-10-CM | POA: Diagnosis not present

## 2021-02-05 DIAGNOSIS — N186 End stage renal disease: Secondary | ICD-10-CM | POA: Diagnosis not present

## 2021-02-08 DIAGNOSIS — N186 End stage renal disease: Secondary | ICD-10-CM | POA: Diagnosis not present

## 2021-02-08 DIAGNOSIS — D509 Iron deficiency anemia, unspecified: Secondary | ICD-10-CM | POA: Diagnosis not present

## 2021-02-08 DIAGNOSIS — Z992 Dependence on renal dialysis: Secondary | ICD-10-CM | POA: Diagnosis not present

## 2021-02-10 DIAGNOSIS — N186 End stage renal disease: Secondary | ICD-10-CM | POA: Diagnosis not present

## 2021-02-10 DIAGNOSIS — Z992 Dependence on renal dialysis: Secondary | ICD-10-CM | POA: Diagnosis not present

## 2021-02-12 DIAGNOSIS — Z992 Dependence on renal dialysis: Secondary | ICD-10-CM | POA: Diagnosis not present

## 2021-02-12 DIAGNOSIS — N186 End stage renal disease: Secondary | ICD-10-CM | POA: Diagnosis not present

## 2021-02-15 DIAGNOSIS — N186 End stage renal disease: Secondary | ICD-10-CM | POA: Diagnosis not present

## 2021-02-15 DIAGNOSIS — Z992 Dependence on renal dialysis: Secondary | ICD-10-CM | POA: Diagnosis not present

## 2021-02-16 DIAGNOSIS — I700 Atherosclerosis of aorta: Secondary | ICD-10-CM | POA: Diagnosis not present

## 2021-02-16 DIAGNOSIS — Z299 Encounter for prophylactic measures, unspecified: Secondary | ICD-10-CM | POA: Diagnosis not present

## 2021-02-16 DIAGNOSIS — E1165 Type 2 diabetes mellitus with hyperglycemia: Secondary | ICD-10-CM | POA: Diagnosis not present

## 2021-02-16 DIAGNOSIS — R600 Localized edema: Secondary | ICD-10-CM | POA: Diagnosis not present

## 2021-02-16 DIAGNOSIS — Z992 Dependence on renal dialysis: Secondary | ICD-10-CM | POA: Diagnosis not present

## 2021-02-16 DIAGNOSIS — Z87891 Personal history of nicotine dependence: Secondary | ICD-10-CM | POA: Diagnosis not present

## 2021-02-16 DIAGNOSIS — R531 Weakness: Secondary | ICD-10-CM | POA: Diagnosis not present

## 2021-02-17 DIAGNOSIS — N186 End stage renal disease: Secondary | ICD-10-CM | POA: Diagnosis not present

## 2021-02-17 DIAGNOSIS — Z992 Dependence on renal dialysis: Secondary | ICD-10-CM | POA: Diagnosis not present

## 2021-02-19 DIAGNOSIS — Z992 Dependence on renal dialysis: Secondary | ICD-10-CM | POA: Diagnosis not present

## 2021-02-19 DIAGNOSIS — N186 End stage renal disease: Secondary | ICD-10-CM | POA: Diagnosis not present

## 2021-02-22 DIAGNOSIS — Z992 Dependence on renal dialysis: Secondary | ICD-10-CM | POA: Diagnosis not present

## 2021-02-22 DIAGNOSIS — N186 End stage renal disease: Secondary | ICD-10-CM | POA: Diagnosis not present

## 2021-02-24 DIAGNOSIS — Z992 Dependence on renal dialysis: Secondary | ICD-10-CM | POA: Diagnosis not present

## 2021-02-24 DIAGNOSIS — N186 End stage renal disease: Secondary | ICD-10-CM | POA: Diagnosis not present

## 2021-02-26 DIAGNOSIS — N186 End stage renal disease: Secondary | ICD-10-CM | POA: Diagnosis not present

## 2021-02-26 DIAGNOSIS — Z992 Dependence on renal dialysis: Secondary | ICD-10-CM | POA: Diagnosis not present

## 2021-02-27 DIAGNOSIS — R6889 Other general symptoms and signs: Secondary | ICD-10-CM | POA: Diagnosis not present

## 2021-02-27 DIAGNOSIS — I5032 Chronic diastolic (congestive) heart failure: Secondary | ICD-10-CM | POA: Diagnosis not present

## 2021-02-27 DIAGNOSIS — J9601 Acute respiratory failure with hypoxia: Secondary | ICD-10-CM | POA: Diagnosis not present

## 2021-02-27 DIAGNOSIS — M6281 Muscle weakness (generalized): Secondary | ICD-10-CM | POA: Diagnosis not present

## 2021-03-01 DIAGNOSIS — N2581 Secondary hyperparathyroidism of renal origin: Secondary | ICD-10-CM | POA: Diagnosis not present

## 2021-03-01 DIAGNOSIS — N186 End stage renal disease: Secondary | ICD-10-CM | POA: Diagnosis not present

## 2021-03-01 DIAGNOSIS — Z992 Dependence on renal dialysis: Secondary | ICD-10-CM | POA: Diagnosis not present

## 2021-03-03 ENCOUNTER — Emergency Department (HOSPITAL_COMMUNITY)
Admission: EM | Admit: 2021-03-03 | Discharge: 2021-03-03 | Disposition: A | Discharge status: Home / Self Care | Payer: Medicare Other | Attending: Emergency Medicine | Admitting: Emergency Medicine

## 2021-03-03 ENCOUNTER — Other Ambulatory Visit: Payer: Self-pay

## 2021-03-03 ENCOUNTER — Encounter (HOSPITAL_COMMUNITY): Payer: Self-pay

## 2021-03-03 ENCOUNTER — Emergency Department (HOSPITAL_COMMUNITY): Payer: Medicare Other

## 2021-03-03 DIAGNOSIS — M50322 Other cervical disc degeneration at C5-C6 level: Secondary | ICD-10-CM | POA: Diagnosis not present

## 2021-03-03 DIAGNOSIS — R0602 Shortness of breath: Secondary | ICD-10-CM

## 2021-03-03 DIAGNOSIS — Z7901 Long term (current) use of anticoagulants: Secondary | ICD-10-CM | POA: Diagnosis not present

## 2021-03-03 DIAGNOSIS — R600 Localized edema: Secondary | ICD-10-CM | POA: Insufficient documentation

## 2021-03-03 DIAGNOSIS — M545 Low back pain, unspecified: Secondary | ICD-10-CM | POA: Diagnosis not present

## 2021-03-03 DIAGNOSIS — R519 Headache, unspecified: Secondary | ICD-10-CM | POA: Insufficient documentation

## 2021-03-03 DIAGNOSIS — S0990XA Unspecified injury of head, initial encounter: Secondary | ICD-10-CM | POA: Diagnosis not present

## 2021-03-03 DIAGNOSIS — G9389 Other specified disorders of brain: Secondary | ICD-10-CM | POA: Diagnosis not present

## 2021-03-03 DIAGNOSIS — M2578 Osteophyte, vertebrae: Secondary | ICD-10-CM | POA: Diagnosis not present

## 2021-03-03 DIAGNOSIS — S199XXA Unspecified injury of neck, initial encounter: Secondary | ICD-10-CM | POA: Diagnosis not present

## 2021-03-03 DIAGNOSIS — G8929 Other chronic pain: Secondary | ICD-10-CM | POA: Diagnosis not present

## 2021-03-03 DIAGNOSIS — M50323 Other cervical disc degeneration at C6-C7 level: Secondary | ICD-10-CM | POA: Diagnosis not present

## 2021-03-03 DIAGNOSIS — J811 Chronic pulmonary edema: Secondary | ICD-10-CM | POA: Diagnosis not present

## 2021-03-03 DIAGNOSIS — W19XXXA Unspecified fall, initial encounter: Secondary | ICD-10-CM | POA: Diagnosis not present

## 2021-03-03 DIAGNOSIS — Z743 Need for continuous supervision: Secondary | ICD-10-CM | POA: Diagnosis not present

## 2021-03-03 DIAGNOSIS — R0902 Hypoxemia: Secondary | ICD-10-CM | POA: Diagnosis not present

## 2021-03-03 DIAGNOSIS — J9811 Atelectasis: Secondary | ICD-10-CM | POA: Diagnosis not present

## 2021-03-03 DIAGNOSIS — M5031 Other cervical disc degeneration,  high cervical region: Secondary | ICD-10-CM | POA: Diagnosis not present

## 2021-03-03 DIAGNOSIS — W01198A Fall on same level from slipping, tripping and stumbling with subsequent striking against other object, initial encounter: Secondary | ICD-10-CM | POA: Insufficient documentation

## 2021-03-03 DIAGNOSIS — M4126 Other idiopathic scoliosis, lumbar region: Secondary | ICD-10-CM | POA: Diagnosis not present

## 2021-03-03 DIAGNOSIS — I517 Cardiomegaly: Secondary | ICD-10-CM | POA: Diagnosis not present

## 2021-03-03 LAB — BASIC METABOLIC PANEL
Anion gap: 16 — ABNORMAL HIGH (ref 5–15)
BUN: 29 mg/dL — ABNORMAL HIGH (ref 8–23)
CO2: 26 mmol/L (ref 22–32)
Calcium: 9.6 mg/dL (ref 8.9–10.3)
Chloride: 94 mmol/L — ABNORMAL LOW (ref 98–111)
Creatinine, Ser: 5.34 mg/dL — ABNORMAL HIGH (ref 0.44–1.00)
GFR, Estimated: 8 mL/min — ABNORMAL LOW (ref >60)
Glucose, Bld: 105 mg/dL — ABNORMAL HIGH (ref 70–99)
Potassium: 3.5 mmol/L (ref 3.5–5.1)
Sodium: 136 mmol/L (ref 135–145)

## 2021-03-03 LAB — BRAIN NATRIURETIC PEPTIDE: B Natriuretic Peptide: 1411 pg/mL — ABNORMAL HIGH (ref 0.0–100.0)

## 2021-03-03 LAB — CBC WITH DIFFERENTIAL/PLATELET
Abs Immature Granulocytes: 0.03 10*3/uL (ref 0.00–0.07)
Basophils Absolute: 0 10*3/uL (ref 0.0–0.1)
Basophils Relative: 0 %
Eosinophils Absolute: 0.1 10*3/uL (ref 0.0–0.5)
Eosinophils Relative: 1 %
HCT: 30.6 % — ABNORMAL LOW (ref 36.0–46.0)
Hemoglobin: 9.3 g/dL — ABNORMAL LOW (ref 12.0–15.0)
Immature Granulocytes: 0 %
Lymphocytes Relative: 8 %
Lymphs Abs: 0.7 10*3/uL (ref 0.7–4.0)
MCH: 34.3 pg — ABNORMAL HIGH (ref 26.0–34.0)
MCHC: 30.4 g/dL (ref 30.0–36.0)
MCV: 112.9 fL — ABNORMAL HIGH (ref 80.0–100.0)
Monocytes Absolute: 0.5 10*3/uL (ref 0.1–1.0)
Monocytes Relative: 6 %
Neutro Abs: 6.5 10*3/uL (ref 1.7–7.7)
Neutrophils Relative %: 85 %
Platelets: 151 10*3/uL (ref 150–400)
RBC: 2.71 MIL/uL — ABNORMAL LOW (ref 3.87–5.11)
RDW: 17.6 % — ABNORMAL HIGH (ref 11.5–15.5)
WBC: 7.8 10*3/uL (ref 4.0–10.5)
nRBC: 0 % (ref 0.0–0.2)

## 2021-03-03 LAB — TROPONIN I (HIGH SENSITIVITY)
Troponin I (High Sensitivity): 31 ng/L — ABNORMAL HIGH (ref <18)
Troponin I (High Sensitivity): 27 ng/L — ABNORMAL HIGH (ref <18)

## 2021-03-03 MED ORDER — IOHEXOL 350 MG/ML SOLN
75.0000 mL | Freq: Once | INTRAVENOUS | Status: AC | PRN
  Administered 2021-03-03: 75 mL via INTRAVENOUS

## 2021-03-03 MED ORDER — OXYCODONE-ACETAMINOPHEN 5-325 MG PO TABS
1.0000 | ORAL_TABLET | Freq: Once | ORAL | Status: AC
  Administered 2021-03-03: 1 via ORAL
  Filled 2021-03-03: qty 1

## 2021-03-03 NOTE — Discharge Instructions (Signed)
Your test today did not show any acute changes.  Follow-up with your doctor to be rechecked.  Continue your current medications.  Return as needed for worsening

## 2021-03-03 NOTE — ED Triage Notes (Addendum)
Pt fell earlier tonight and claims she hit her head but denies LOC. Pt presents with back back and leg pain with hx of neopathy and chronic back pain. Pt vitals stable. Pt lives alone

## 2021-03-03 NOTE — ED Provider Notes (Signed)
[facility] Provider Note   CSN: [id number] Arrival date & time: 03/03/21  3716     History  Chief Complaint  Patient presents with   [name]    Jody [name] is a 81 y.o. female.  Patient presents to the emergency department for evaluation after a fall.  Patient reports that she fell earlier tonight because her leg gave out.  She reports chronic leg pain secondary to neuropathy as well as chronic back pain.  Patient did follow-up against the wall.  She did strike her head but did not lose consciousness.  She initially had a headache but this has resolved.  Her only complaint currently is her chronic leg pain and lower back pain.  Patient was noted to be hypoxic by EMS.  She is reportedly to be on continuous oxygen but was not on her oxygen.      Home Medications Prior to Admission medications   Medication Sig Start Date End Date Taking? Authorizing Provider  albuterol (ACCUNEB) 0.63 MG/3ML nebulizer solution Take 1 ampule by nebulization every 6 (six) hours as needed for wheezing.    [provider]  albuterol (PROVENTIL HFA;VENTOLIN HFA) 108 (90 BASE) MCG/ACT inhaler Inhale 1-2 puffs into the lungs every 6 (six) hours as needed for wheezing or shortness of breath. Reported on 03/01/2015    [provider]  apixaban (ELIQUIS) 2.5 MG TABS tablet Take by mouth 2 (two) times daily.    [provider]  atorvastatin (LIPITOR) 40 MG tablet Take 1 tablet by mouth at bedtime.  01/22/16   [provider]  diltiazem (TIAZAC) 120 MG 24 hr capsule Take 120 mg by mouth daily.    [provider]  fluticasone-salmeterol (ADVAIR HFA) 230-21 MCG/ACT inhaler Inhale 2 puffs into the lungs 2 (two) times daily.    [provider]  furosemide (LASIX) 40 MG tablet Take 40 mg by mouth.    [provider]  gabapentin (NEURONTIN) 100 MG capsule Take 100 mg by mouth 3 (three) times daily.    [provider]  magnesium  hydroxide (MILK OF MAGNESIA) 400 MG/5ML suspension Take 15 mLs by mouth daily as needed for mild constipation.    [provider]  metoprolol tartrate (LOPRESSOR) 25 MG tablet Take 0.5 tablets (12.5 mg total) by mouth 2 (two) times daily. Patient not taking: Reported on 01/12/2021 10/18/16   Kathie Dike, MD  pantoprazole (PROTONIX) 40 MG tablet Take 1 tablet (40 mg total) by mouth daily. 12/31/19 12/30/20  Manuella Ghazi, Pratik D, DO  pramipexole (MIRAPEX) 0.125 MG tablet Take 0.125 mg by mouth daily.    [provider]  sevelamer carbonate (RENVELA) 800 MG tablet Take 800 mg by mouth 3 (three) times daily with meals.  07/22/19   [provider]  sodium bicarbonate 650 MG tablet Take 1 tablet (650 mg total) by mouth 2 (two) times daily. Patient not taking: Reported on 01/12/2021 10/17/16   TatShanon Brow, MD  TRESIBA FLEXTOUCH 100 UNIT/ML FlexTouch Pen Inject 12 Units into the skin daily in the afternoon.  Patient not taking: Reported on 01/12/2021 07/23/19   [provider]      Allergies    Codeine    Review of Systems   Review of Systems  Respiratory:  Positive for shortness of breath.   Cardiovascular:  Positive for leg swelling.  Musculoskeletal:  Positive for back pain.   Physical Exam Updated Vital Signs BP (!) 139/98    Pulse 86    Temp  98.6 F (37 C)    Resp (!) 24    Ht 5\' 7"  (1.702 m)    Wt 63.5 kg    SpO2 96%    BMI 21.93 kg/m  Physical Exam Vitals and nursing note reviewed.  Constitutional:      General: She is not in acute distress.    Appearance: Normal appearance. She is well-developed.  HENT:     Head: Normocephalic and atraumatic.     Right Ear: Hearing normal.     Left Ear: Hearing normal.     Nose: Nose normal.  Eyes:     Conjunctiva/sclera: Conjunctivae normal.     Pupils: Pupils are equal, round, and reactive to light.  Cardiovascular:     Rate and Rhythm: Rhythm irregularly irregular.     Heart sounds: S1 normal and S2 normal. No  murmur heard.   No friction rub. No gallop.  Pulmonary:     Effort: Pulmonary effort is normal. No respiratory distress.     Breath sounds: Normal breath sounds.  Chest:     Chest wall: No tenderness.  Abdominal:     General: Bowel sounds are normal.     Palpations: Abdomen is soft.     Tenderness: There is no abdominal tenderness. There is no guarding or rebound. Negative signs include Murphy's sign and McBurney's sign.     Hernia: No hernia is present.  Musculoskeletal:        General: Normal range of motion.     Cervical back: Normal range of motion and neck supple.     Right lower leg: 1+ Pitting Edema present.     Left lower leg: 1+ Pitting Edema present.  Skin:    General: Skin is warm and dry.     Findings: No rash.  Neurological:     Mental Status: She is alert and oriented to person, place, and time.     GCS: GCS eye subscore is 4. GCS verbal subscore is 5. GCS motor subscore is 6.     Cranial Nerves: No cranial nerve deficit.     Sensory: No sensory deficit.     Coordination: Coordination normal.  Psychiatric:        Speech: Speech normal.        Behavior: Behavior normal.        Thought Content: Thought content normal.    ED Results / Procedures / Treatments   Labs (all labs ordered are listed, but only abnormal results are displayed) Labs Reviewed  CBC WITH DIFFERENTIAL/PLATELET  BASIC METABOLIC PANEL  BRAIN NATRIURETIC PEPTIDE  TROPONIN I (HIGH SENSITIVITY)    EKG EKG Interpretation  Date/Time:  Thursday March 03 2021 04:58:41 EST Ventricular Rate:  86 PR Interval:    QRS Duration: 91 QT Interval:  323 QTC Calculation: 387 R Axis:   1 Text Interpretation: Atrial fibrillation Low voltage, precordial leads Repol abnrm suggests ischemia, diffuse leads Artifact in lead(s) I II III aVR aVL aVF No significant change since last tracing Confirmed by Orpah Greek [phone]) on 03/03/2021 5:04:11 AM  Radiology DG Chest 2 View  Result Date:  03/03/2021 CLINICAL DATA:  Fall.  Shortness of breath. EXAM: CHEST - 2 VIEW COMPARISON:  10/27/2020. FINDINGS: Stable cardiac enlargement. Aortic atherosclerotic calcifications. There is persistent and progressive increase perihilar opacification in both upper lobes concerning for underlying adenopathy. There is diffuse pulmonary vascular congestion. Atelectasis is noted in the left lung base. IMPRESSION: 1. Progressive bilateral upper lobe perihilar opacification concerning for underlying adenopathy.  Recommend further evaluation with contrast enhanced CT of the chest. 2. Cardiac enlargement with pulmonary vascular congestion. Electronically Signed   By: Kerby Moors M.D.   On: 03/03/2021 05:56   DG Thoracic Spine 2 View  Result Date: 03/03/2021 CLINICAL DATA:  81 year old female status post fall with pain. EXAM: THORACIC SPINE 2 VIEWS COMPARISON:  CTA chest 06/06/2020. FINDINGS: Thoracic segmentation appears normal. Bone mineralization is within normal limits for age. Flowing thoracic endplate osteophytes resulting in occasional interbody ankylosis as demonstrated on the CTA last year. Stable vertebral height and alignment. Relatively preserved disc spaces. Cervicothoracic junction alignment is within normal limits. No acute osseous abnormality identified. Calcified aortic atherosclerosis. Stable visible thoracic visceral contours. IMPRESSION: No acute osseous abnormality identified in the thoracic spine. Diffuse idiopathic skeletal hyperostosis (DISH). Electronically Signed   By: Genevie Ann M.D.   On: 03/03/2021 05:55   DG Lumbar Spine Complete  Result Date: 03/03/2021 CLINICAL DATA:  81 year old female status post fall with pain. EXAM: LUMBAR SPINE - COMPLETE 4+ VIEW COMPARISON:  Thoracic radiographs today. Lumbar radiographs 01/29/2021. CT Abdomen and Pelvis 08/09/2018. FINDINGS: Transitional lumbosacral anatomy with lumbarized S1 level. Chronic dextroconvex lumbar scoliosis. Widespread chronic disc and  endplate degeneration with extensive lumbar vacuum disc. Chronic interbody ankylosis L2-L3. And possible L4-L5 interbody ankylosis, new since 2020. Grossly intact visible sacrum. No acute osseous abnormality identified. Extensive Aortoiliac calcified atherosclerosis. IMPRESSION: 1. No acute osseous abnormality identified in the lumbar spine. 2. Transitional lumbosacral anatomy with lumbarized S1 level. Chronic dextroconvex lumbar scoliosis and widespread severe disc and endplate degeneration. Chronic ankylosis at L2-L3, and possible progressive L4-L5 ankylosis since 2020. 3.  Aortic Atherosclerosis (ICD10-I70.0). Electronically Signed   By: Genevie Ann M.D.   On: 03/03/2021 05:58   CT HEAD WO CONTRAST (5MM)  Result Date: 03/03/2021 CLINICAL DATA:  Head trauma.  Status post fall. EXAM: CT HEAD WITHOUT CONTRAST CT CERVICAL SPINE WITHOUT CONTRAST TECHNIQUE: Multidetector CT imaging of the head and cervical spine was performed following the standard protocol without intravenous contrast. Multiplanar CT image reconstructions of the cervical spine were also generated. RADIATION DOSE REDUCTION: This exam was performed according to the departmental dose-optimization program which includes automated exposure control, adjustment of the mA and/or kV according to patient size and/or use of iterative reconstruction technique. COMPARISON:  02/11/2021 FINDINGS: CT HEAD FINDINGS Brain: No evidence of acute infarction, hemorrhage, hydrocephalus, extra-axial collection or mass lesion/mass effect. There is moderate patchy and confluent areas of low-attenuation within the subcortical and periventricular white matter compatible with chronic microvascular disease. Encephalomalacia within the right parieto-occipital lobe appears unchanged from 02/01/2021. Vascular: No hyperdense vessel or unexpected calcification. Skull: Normal. Negative for fracture or focal lesion. Sinuses/Orbits: No acute finding. Other: None CT CERVICAL SPINE FINDINGS  Alignment: Normal. Skull base and vertebrae: No acute fracture. No primary bone lesion or focal pathologic process. Soft tissues and spinal canal: No prevertebral fluid or swelling. No visible canal hematoma. Disc levels: There is ankylosis of the C3 through C5 vertebra. Marked degenerative disc disease is identified at C5-6 and C6-7. Mild degenerative disc disease noted at C2-3. Upper chest: Negative. Other: None IMPRESSION: 1. No acute intracranial abnormality. 2. Chronic microvascular disease and right parieto-occipital lobe encephalomalacia. 3. No evidence for cervical spine fracture or subluxation. 4. Advanced cervical degenerative disc disease as above. Electronically Signed   By: Kerby Moors M.D.   On: 03/03/2021 05:45   CT CERVICAL SPINE WO CONTRAST  Result Date: 03/03/2021 CLINICAL DATA:  Head trauma.  Status post fall.  EXAM: CT HEAD WITHOUT CONTRAST CT CERVICAL SPINE WITHOUT CONTRAST TECHNIQUE: Multidetector CT imaging of the head and cervical spine was performed following the standard protocol without intravenous contrast. Multiplanar CT image reconstructions of the cervical spine were also generated. RADIATION DOSE REDUCTION: This exam was performed according to the departmental dose-optimization program which includes automated exposure control, adjustment of the mA and/or kV according to patient size and/or use of iterative reconstruction technique. COMPARISON:  02/11/2021 FINDINGS: CT HEAD FINDINGS Brain: No evidence of acute infarction, hemorrhage, hydrocephalus, extra-axial collection or mass lesion/mass effect. There is moderate patchy and confluent areas of low-attenuation within the subcortical and periventricular white matter compatible with chronic microvascular disease. Encephalomalacia within the right parieto-occipital lobe appears unchanged from 02/01/2021. Vascular: No hyperdense vessel or unexpected calcification. Skull: Normal. Negative for fracture or focal lesion.  Sinuses/Orbits: No acute finding. Other: None CT CERVICAL SPINE FINDINGS Alignment: Normal. Skull base and vertebrae: No acute fracture. No primary bone lesion or focal pathologic process. Soft tissues and spinal canal: No prevertebral fluid or swelling. No visible canal hematoma. Disc levels: There is ankylosis of the C3 through C5 vertebra. Marked degenerative disc disease is identified at C5-6 and C6-7. Mild degenerative disc disease noted at C2-3. Upper chest: Negative. Other: None IMPRESSION: 1. No acute intracranial abnormality. 2. Chronic microvascular disease and right parieto-occipital lobe encephalomalacia. 3. No evidence for cervical spine fracture or subluxation. 4. Advanced cervical degenerative disc disease as above. Electronically Signed   By: Kerby Moors M.D.   On: 03/03/2021 05:45    Procedures Procedures    Medications Ordered in ED Medications - No data to display  ED Course/ Medical Decision Making/ A&P                           Medical Decision Making Amount and/or Complexity of Data Reviewed Labs: ordered. Radiology: ordered.   Patient presents to the emergency department for evaluation after a fall.  Patient apparently lives alone, had a fall and could not get up.  EMS reports that she was hypoxic upon arrival, had neglected to utilize her chronic oxygen.  She improved with supplemental oxygen.  Patient still dyspneic and out of breath upon arrival to the emergency department.  She is on Eliquis and therefore CT head and cervical spine were performed.  No acute injury noted.  Patient is a dialysis patient.  Will perform further work-up to evaluate electrolytes, cardiac evaluation in light of her shortness of breath.  Will sign out to oncoming ER physician to follow-up on results.        Final Clinical Impression(s) / ED Diagnoses Final diagnoses:  Chronic low back pain, unspecified back pain laterality, unspecified whether sciatica present  Shortness of breath     Rx / DC Orders ED Discharge Orders     None         Orpah Greek, MD 03/03/21 [phone]

## 2021-03-03 NOTE — ED Provider Notes (Signed)
Clinical Course as of 03/03/21 1036  Thu Mar 03, 2021  0913 Chest CT does not show evidence of pulmonary embolism. [JK]  0913 Serial troponins are stable. [JK]  [phone] Discussed with family, Sam.  Pt is supposed to be getting a wheelchair.  Pt is non ambulatory at baseline. [JK]    Clinical Course User Index [JK] Dorie Rank, MD   Patient initially seen by Dr. Waverly Ferrari. Patient's troponins were slightly elevated but this is similar to baseline.  Patient's hemoglobin is stable compared to previous values.  No acute electrolyte abnormalities and renal function consistent with her chronic kidney disease.  BNP elevated but this is similar to her previous values and she is not showing signs of acute pulmonary edema on her x-ray.  No acute injuries noted on her CT scans and x-rays.  Her CT angiogram does not show evidence of embolism.  Patient was given oxycodone for her pain.  We will make sure the patient can ambulate somewhat.  As of right now no acute conditions requiring hospitalization.  Patient was unable to ambulate but I discussed with family members, she has not been walking.  Patient is supposed to be using a wheelchair now at home.  They are waiting for that to arrive.  Family is comfortable with her going home.  They will pick her up   Dorie Rank, MD 03/03/21 1037

## 2021-03-04 ENCOUNTER — Encounter (HOSPITAL_COMMUNITY): Payer: Self-pay | Admitting: *Deleted

## 2021-03-04 ENCOUNTER — Emergency Department (HOSPITAL_COMMUNITY): Payer: Medicare Other

## 2021-03-04 ENCOUNTER — Observation Stay (HOSPITAL_COMMUNITY)
Admission: EM | Admit: 2021-03-04 | Discharge: 2021-03-07 | Disposition: A | Discharge status: Skilled Nursing Facility | Payer: Medicare Other | Attending: Family Medicine | Admitting: Family Medicine

## 2021-03-04 ENCOUNTER — Other Ambulatory Visit: Payer: Self-pay

## 2021-03-04 DIAGNOSIS — M79604 Pain in right leg: Principal | ICD-10-CM | POA: Insufficient documentation

## 2021-03-04 DIAGNOSIS — E1122 Type 2 diabetes mellitus with diabetic chronic kidney disease: Secondary | ICD-10-CM | POA: Diagnosis not present

## 2021-03-04 DIAGNOSIS — R531 Weakness: Secondary | ICD-10-CM | POA: Diagnosis not present

## 2021-03-04 DIAGNOSIS — Z7985 Long-term (current) use of injectable non-insulin antidiabetic drugs: Secondary | ICD-10-CM | POA: Diagnosis not present

## 2021-03-04 DIAGNOSIS — R296 Repeated falls: Secondary | ICD-10-CM | POA: Diagnosis not present

## 2021-03-04 DIAGNOSIS — D631 Anemia in chronic kidney disease: Secondary | ICD-10-CM | POA: Diagnosis present

## 2021-03-04 DIAGNOSIS — R29898 Other symptoms and signs involving the musculoskeletal system: Secondary | ICD-10-CM

## 2021-03-04 DIAGNOSIS — Z992 Dependence on renal dialysis: Secondary | ICD-10-CM | POA: Diagnosis not present

## 2021-03-04 DIAGNOSIS — M25551 Pain in right hip: Secondary | ICD-10-CM | POA: Diagnosis not present

## 2021-03-04 DIAGNOSIS — I5030 Unspecified diastolic (congestive) heart failure: Secondary | ICD-10-CM | POA: Insufficient documentation

## 2021-03-04 DIAGNOSIS — N186 End stage renal disease: Secondary | ICD-10-CM | POA: Diagnosis not present

## 2021-03-04 DIAGNOSIS — Z7901 Long term (current) use of anticoagulants: Secondary | ICD-10-CM | POA: Diagnosis not present

## 2021-03-04 DIAGNOSIS — Z96651 Presence of right artificial knee joint: Secondary | ICD-10-CM | POA: Insufficient documentation

## 2021-03-04 DIAGNOSIS — J438 Other emphysema: Secondary | ICD-10-CM

## 2021-03-04 DIAGNOSIS — I5043 Acute on chronic combined systolic (congestive) and diastolic (congestive) heart failure: Secondary | ICD-10-CM | POA: Diagnosis present

## 2021-03-04 DIAGNOSIS — Z79899 Other long term (current) drug therapy: Secondary | ICD-10-CM | POA: Insufficient documentation

## 2021-03-04 DIAGNOSIS — E1121 Type 2 diabetes mellitus with diabetic nephropathy: Secondary | ICD-10-CM | POA: Diagnosis present

## 2021-03-04 DIAGNOSIS — I10 Essential (primary) hypertension: Secondary | ICD-10-CM | POA: Diagnosis present

## 2021-03-04 DIAGNOSIS — W19XXXA Unspecified fall, initial encounter: Secondary | ICD-10-CM | POA: Diagnosis not present

## 2021-03-04 DIAGNOSIS — I5032 Chronic diastolic (congestive) heart failure: Secondary | ICD-10-CM | POA: Diagnosis present

## 2021-03-04 DIAGNOSIS — R269 Unspecified abnormalities of gait and mobility: Secondary | ICD-10-CM

## 2021-03-04 DIAGNOSIS — R2689 Other abnormalities of gait and mobility: Secondary | ICD-10-CM | POA: Insufficient documentation

## 2021-03-04 DIAGNOSIS — J449 Chronic obstructive pulmonary disease, unspecified: Secondary | ICD-10-CM | POA: Insufficient documentation

## 2021-03-04 DIAGNOSIS — I132 Hypertensive heart and chronic kidney disease with heart failure and with stage 5 chronic kidney disease, or end stage renal disease: Secondary | ICD-10-CM | POA: Insufficient documentation

## 2021-03-04 DIAGNOSIS — Z9181 History of falling: Secondary | ICD-10-CM

## 2021-03-04 DIAGNOSIS — I517 Cardiomegaly: Secondary | ICD-10-CM | POA: Diagnosis not present

## 2021-03-04 DIAGNOSIS — I5033 Acute on chronic diastolic (congestive) heart failure: Secondary | ICD-10-CM | POA: Diagnosis present

## 2021-03-04 DIAGNOSIS — I482 Chronic atrial fibrillation, unspecified: Secondary | ICD-10-CM | POA: Insufficient documentation

## 2021-03-04 DIAGNOSIS — J811 Chronic pulmonary edema: Secondary | ICD-10-CM | POA: Diagnosis not present

## 2021-03-04 DIAGNOSIS — I5042 Chronic combined systolic (congestive) and diastolic (congestive) heart failure: Secondary | ICD-10-CM | POA: Diagnosis present

## 2021-03-04 DIAGNOSIS — Z87891 Personal history of nicotine dependence: Secondary | ICD-10-CM | POA: Diagnosis not present

## 2021-03-04 DIAGNOSIS — R52 Pain, unspecified: Secondary | ICD-10-CM | POA: Diagnosis not present

## 2021-03-04 DIAGNOSIS — L8990 Pressure ulcer of unspecified site, unspecified stage: Secondary | ICD-10-CM | POA: Insufficient documentation

## 2021-03-04 DIAGNOSIS — M25511 Pain in right shoulder: Secondary | ICD-10-CM | POA: Diagnosis not present

## 2021-03-04 DIAGNOSIS — Z20822 Contact with and (suspected) exposure to covid-19: Secondary | ICD-10-CM | POA: Insufficient documentation

## 2021-03-04 LAB — RESP PANEL BY RT-PCR (FLU A&B, COVID) ARPGX2
Influenza A by PCR: NEGATIVE
Influenza B by PCR: NEGATIVE
SARS Coronavirus 2 by RT PCR: NEGATIVE

## 2021-03-04 LAB — BASIC METABOLIC PANEL
Anion gap: 16 — ABNORMAL HIGH (ref 5–15)
BUN: 41 mg/dL — ABNORMAL HIGH (ref 8–23)
CO2: 24 mmol/L (ref 22–32)
Calcium: 9.2 mg/dL (ref 8.9–10.3)
Chloride: 93 mmol/L — ABNORMAL LOW (ref 98–111)
Creatinine, Ser: 6.53 mg/dL — ABNORMAL HIGH (ref 0.44–1.00)
GFR, Estimated: 6 mL/min — ABNORMAL LOW (ref >60)
Glucose, Bld: 124 mg/dL — ABNORMAL HIGH (ref 70–99)
Potassium: 3.8 mmol/L (ref 3.5–5.1)
Sodium: 133 mmol/L — ABNORMAL LOW (ref 135–145)

## 2021-03-04 LAB — CBC WITH DIFFERENTIAL/PLATELET
Abs Immature Granulocytes: 0.02 10*3/uL (ref 0.00–0.07)
Basophils Absolute: 0 10*3/uL (ref 0.0–0.1)
Basophils Relative: 0 %
Eosinophils Absolute: 0 10*3/uL (ref 0.0–0.5)
Eosinophils Relative: 1 %
HCT: 29.9 % — ABNORMAL LOW (ref 36.0–46.0)
Hemoglobin: 9.2 g/dL — ABNORMAL LOW (ref 12.0–15.0)
Immature Granulocytes: 0 %
Lymphocytes Relative: 9 %
Lymphs Abs: 0.6 10*3/uL — ABNORMAL LOW (ref 0.7–4.0)
MCH: 34.2 pg — ABNORMAL HIGH (ref 26.0–34.0)
MCHC: 30.8 g/dL (ref 30.0–36.0)
MCV: 111.2 fL — ABNORMAL HIGH (ref 80.0–100.0)
Monocytes Absolute: 0.5 10*3/uL (ref 0.1–1.0)
Monocytes Relative: 7 %
Neutro Abs: 5.3 10*3/uL (ref 1.7–7.7)
Neutrophils Relative %: 83 %
Platelets: 146 10*3/uL — ABNORMAL LOW (ref 150–400)
RBC: 2.69 MIL/uL — ABNORMAL LOW (ref 3.87–5.11)
RDW: 17.4 % — ABNORMAL HIGH (ref 11.5–15.5)
WBC: 6.5 10*3/uL (ref 4.0–10.5)
nRBC: 0 % (ref 0.0–0.2)

## 2021-03-04 LAB — GLUCOSE, CAPILLARY: Glucose-Capillary: 162 mg/dL — ABNORMAL HIGH (ref 70–99)

## 2021-03-04 MED ORDER — INSULIN ASPART 100 UNIT/ML IJ SOLN
0.0000 [IU] | Freq: Three times a day (TID) | INTRAMUSCULAR | Status: DC
  Administered 2021-03-05: 1 [IU] via SUBCUTANEOUS
  Administered 2021-03-06 (×2): 2 [IU] via SUBCUTANEOUS
  Administered 2021-03-07: 1 [IU] via SUBCUTANEOUS

## 2021-03-04 MED ORDER — ACETAMINOPHEN 325 MG PO TABS
650.0000 mg | ORAL_TABLET | Freq: Four times a day (QID) | ORAL | Status: DC | PRN
  Administered 2021-03-05 – 2021-03-07 (×5): 650 mg via ORAL
  Filled 2021-03-04 (×6): qty 2

## 2021-03-04 MED ORDER — APIXABAN 2.5 MG PO TABS
2.5000 mg | ORAL_TABLET | Freq: Two times a day (BID) | ORAL | Status: DC
  Administered 2021-03-04 – 2021-03-07 (×6): 2.5 mg via ORAL
  Filled 2021-03-04 (×6): qty 1

## 2021-03-04 MED ORDER — DILTIAZEM HCL ER COATED BEADS 120 MG PO CP24
120.0000 mg | ORAL_CAPSULE | Freq: Every day | ORAL | Status: DC
  Administered 2021-03-04 – 2021-03-07 (×4): 120 mg via ORAL
  Filled 2021-03-04 (×4): qty 1

## 2021-03-04 MED ORDER — INSULIN ASPART 100 UNIT/ML IJ SOLN: 0.0000 [IU] | Freq: Every day | INTRAMUSCULAR | Status: DC

## 2021-03-04 MED ORDER — FUROSEMIDE 40 MG PO TABS
40.0000 mg | ORAL_TABLET | Freq: Every day | ORAL | Status: DC
  Administered 2021-03-04 – 2021-03-07 (×4): 40 mg via ORAL
  Filled 2021-03-04 (×4): qty 1

## 2021-03-04 MED ORDER — CHLORHEXIDINE GLUCONATE CLOTH 2 % EX PADS
6.0000 | MEDICATED_PAD | Freq: Every day | CUTANEOUS | Status: DC
  Administered 2021-03-05 – 2021-03-07 (×3): 6 via TOPICAL

## 2021-03-04 MED ORDER — METOPROLOL TARTRATE 25 MG PO TABS
12.5000 mg | ORAL_TABLET | Freq: Two times a day (BID) | ORAL | Status: DC
  Administered 2021-03-04 – 2021-03-05 (×2): 12.5 mg via ORAL
  Filled 2021-03-04 (×2): qty 1

## 2021-03-04 MED ORDER — ALBUTEROL SULFATE 0.63 MG/3ML IN NEBU: 1.0000 | INHALATION_SOLUTION | Freq: Four times a day (QID) | RESPIRATORY_TRACT | Status: DC | PRN

## 2021-03-04 MED ORDER — ACETAMINOPHEN 650 MG RE SUPP: 650.0000 mg | Freq: Four times a day (QID) | RECTAL | Status: DC | PRN

## 2021-03-04 MED ORDER — MOMETASONE FURO-FORMOTEROL FUM 200-5 MCG/ACT IN AERO
2.0000 | INHALATION_SPRAY | Freq: Two times a day (BID) | RESPIRATORY_TRACT | Status: DC
  Administered 2021-03-04 – 2021-03-07 (×5): 2 via RESPIRATORY_TRACT
  Filled 2021-03-04: qty 8.8

## 2021-03-04 MED ORDER — ALBUTEROL SULFATE (2.5 MG/3ML) 0.083% IN NEBU
2.5000 mg | INHALATION_SOLUTION | Freq: Four times a day (QID) | RESPIRATORY_TRACT | Status: DC | PRN
  Administered 2021-03-05 – 2021-03-07 (×2): 2.5 mg via RESPIRATORY_TRACT
  Administered 2021-03-07: 5 mg via RESPIRATORY_TRACT
  Filled 2021-03-04 (×4): qty 3

## 2021-03-04 NOTE — TOC Initial Note (Signed)
Transition of Care [facility]) - Initial/Assessment Note    Patient Details  [name] MRN: [id number] Date of Birth: [birth year]  Transition of Care [facility]) CM/SW Contact:    Boneta Lucks, RN Phone Number: 03/04/2021, 2:43 PM  Clinical Narrative:        Patient in ED, [facility] consulted for SNF. TOC spoke with niece, Pam listed on the chart, unable to get patient in hallway bed.  Pam states she would be open to SNF, she was stating last week she felt she need to go to rehab.   PT eval needed before TOC can fax out. Patient will need INS AUTH before she can discharge. Patient is Vaccinated, First choice [facility]. TOC to follow.            Expected Discharge Plan: Skilled Nursing Facility Barriers to Discharge: Continued Medical Work up  Patient Goals and CMS Choice Patient states their goals for this hospitalization and ongoing recovery are:: to get better CMS Medicare.gov Compare Post Acute Care list provided to:: Patient   Expected Discharge Plan and Services Expected Discharge Plan: [place]        Emotional Assessment    Alcohol / Substance Use: Not Applicable Psych Involvement: No (comment)  Admission diagnosis:  EMS Patient Active Problem List   Diagnosis Date Noted   Rectal bleeding 12/27/2019   Acute GI bleeding 12/27/2019   NSVT (nonsustained ventricular tachycardia) 10/27/2019   Acute respiratory failure with hypoxia (HCC) 10/26/2019   Megaloblastic anemia 10/26/2019   Chronic blood loss anemia 10/26/2017   Arthritis 10/26/2017   Acute on chronic blood loss anemia 10/26/2017   Iron deficiency anemia 02/23/2017   Chronic diastolic CHF (congestive heart failure) ([place]) 10/15/2016   Elevated troponin 10/15/2016   Type 2 diabetes mellitus with nephropathy ([place]) 10/15/2016   ESRD on dialysis ([place]) 10/15/2016   NSTEMI (non-ST elevated myocardial infarction) ([place]) 10/03/2015   Elevated LFTs 10/03/2015   Encounter for therapeutic drug monitoring  07/29/2015   COPD (chronic obstructive pulmonary disease) ([place]) 06/05/2015   CKD stage 4 due to type 2 diabetes mellitus ([place]) 06/05/2015   Anemia in chronic kidney disease, Stage IV 82/70/7867   Acute diastolic HF (heart failure) ([place]) 06/05/2015   Atrial fibrillation ([place]) 06/05/2015   Atrial fibrillation with rapid ventricular response ([place]) 12/07/2014   TIA (transient ischemic attack) 09/02/2014   Gait difficulty 09/02/2014   Other specified transient cerebral ischemias    Stroke with cerebral ischemia (HCC)    Hemispheric carotid artery syndrome    Essential hypertension    Hyperlipidemia    Type 2 diabetes mellitus with other circulatory complications ([place])    Cerebral infarction ([place]) 05/27/2014   PCP:  Monico Blitz, MD Pharmacy:   [place], [place] [employer] [address] Phone: [phone] Fax: [phone]  Readmission Risk Interventions Readmission Risk Prevention Plan 12/30/2019 10/27/2019  Transportation Screening Complete Complete  PCP or Specialist Appt within 5-7 Days - Complete  Home Care Screening - Complete  Medication Review (RN CM) - Complete  HRI or Home Care Consult Complete -  Social Work Consult for Recovery Care Planning/Counseling Complete -  Palliative Care Screening Not Complete -  Medication Review (RN Care Manager) Complete -  Some recent data might be hidden

## 2021-03-04 NOTE — ED Notes (Signed)
Patient placed on 2L O2 [place].  

## 2021-03-04 NOTE — TOC Progression Note (Addendum)
Transition of Care [facility]) - Progression Note    Patient Details  [name] MRN: [id number] Date of Birth: Jul 05, 1940  [facility]) CM/SW Contact  Boneta Lucks, RN Phone Number: 03/04/2021, 4:16 PM  Clinical Narrative:   PT is recommending SNF. FL2 completed and Sent out to multiple facilities, [facility].  TOC started INS AUTH.  Patient is still in ED. Discharge pending bed offer and Insurance authorization. TOC to follow.   Addendum :  Name listed in [place]  PASSR Name   Charlotta Newton   Expected Discharge Plan: Skilled Nursing Facility Barriers to Discharge: Continued Medical Work up  Expected Discharge Plan and Services Expected Discharge Plan: [place]      Readmission Risk Interventions Readmission Risk Prevention Plan 12/30/2019 10/27/2019  Transportation Screening Complete Complete  PCP or Specialist Appt within 5-7 Days - Complete  Home Care Screening - Complete  Medication Review (RN CM) - Complete  HRI or Home Care Consult Complete -  Social Work Consult for Recovery Care Planning/Counseling Complete -  Palliative Care Screening Not Complete -  Medication Review Press photographer) Complete -  Some recent data might be hidden

## 2021-03-04 NOTE — ED Provider Notes (Signed)
[facility] EMERGENCY DEPARTMENT Provider Note   CSN: [id number] Arrival date & time: 03/04/21  3212     History  Chief Complaint  Patient presents with   Leg Pain    Jody [name] is a 81 y.o. female.   Leg Pain Associated symptoms: no fever        Jody [name] is a 81 y.o. female with past medical history of hypertension, type 2 diabetes, prior cerebral infarction, A. fib with RVR, COPD, stage IV CKD on dialysis, (Tuesday Thursday Saturday) CHF who presents to the Emergency Department from home accompanied by her home health aide for evaluation of pain of her right lower leg and weakness upon standing.  Patient states that she fell 2 days ago at home and was evaluated here in the emergency department yesterday.  She was discharged home with her nephew.  But patient lives alone.  She states that she was unable to care for herself last evening or get up from the chair due to weakness of her legs.  She uses a walker at baseline.  Patient's home health aide states that she found her this morning sitting in a chair and was unable to stand.  Patient also complains of pain of her right shoulder secondary to her recent fall.  Denies abdominal pain, chest pain, shortness of breath or headache.  Nausea vomiting.  Patient states she missed her dialysis treatment yesterday because she was in the emergency department and has been unable to be dialyzed today     Home Medications Prior to Admission medications   Medication Sig Start Date End Date Taking? Authorizing Provider  albuterol (ACCUNEB) 0.63 MG/3ML nebulizer solution Take 1 ampule by nebulization every 6 (six) hours as needed for wheezing.    [provider]  albuterol (PROVENTIL HFA;VENTOLIN HFA) 108 (90 BASE) MCG/ACT inhaler Inhale 1-2 puffs into the lungs every 6 (six) hours as needed for wheezing or shortness of breath. Reported on 03/01/2015    [provider]  apixaban (ELIQUIS) 2.5 MG TABS tablet Take by  mouth 2 (two) times daily.    [provider]  atorvastatin (LIPITOR) 40 MG tablet Take 1 tablet by mouth at bedtime.  01/22/16   [provider]  diltiazem (TIAZAC) 120 MG 24 hr capsule Take 120 mg by mouth daily.    [provider]  fluticasone-salmeterol (ADVAIR HFA) 230-21 MCG/ACT inhaler Inhale 2 puffs into the lungs 2 (two) times daily.    [provider]  furosemide (LASIX) 40 MG tablet Take 40 mg by mouth.    [provider]  gabapentin (NEURONTIN) 100 MG capsule Take 100 mg by mouth 3 (three) times daily.    [provider]  magnesium hydroxide (MILK OF MAGNESIA) 400 MG/5ML suspension Take 15 mLs by mouth daily as needed for mild constipation.    [provider]  metoprolol tartrate (LOPRESSOR) 25 MG tablet Take 0.5 tablets (12.5 mg total) by mouth 2 (two) times daily. Patient not taking: Reported on 01/12/2021 10/18/16   Kathie Dike, MD  pantoprazole (PROTONIX) 40 MG tablet Take 1 tablet (40 mg total) by mouth daily. 12/31/19 12/30/20  Manuella Ghazi, Pratik D, DO  pramipexole (MIRAPEX) 0.125 MG tablet Take 0.125 mg by mouth daily.    [provider]  sevelamer carbonate (RENVELA) 800 MG tablet Take 800 mg by mouth 3 (three) times daily with meals.  07/22/19   [provider]  sodium bicarbonate 650 MG tablet Take 1 tablet (650 mg total) by mouth 2 (  two) times daily. Patient not taking: Reported on 01/12/2021 10/17/16   TatShanon Brow, MD  TRESIBA FLEXTOUCH 100 UNIT/ML FlexTouch Pen Inject 12 Units into the skin daily in the afternoon.  Patient not taking: Reported on 01/12/2021 07/23/19   [provider]      Allergies    Codeine    Review of Systems   Review of Systems  Constitutional:  Negative for chills and fever.  Eyes:  Negative for visual disturbance.  Respiratory:  Negative for chest tightness and shortness of breath.   Cardiovascular:  Negative for chest pain.  Musculoskeletal:  Positive for  arthralgias (Right shoulder pain).  Neurological:  Positive for weakness. Negative for dizziness and headaches.  All other systems reviewed and are negative.  Physical Exam Updated Vital Signs BP (!) 143/95 (BP Location: Left Arm)    Pulse 75    Temp 99.2 F (37.3 C) (Oral)    Resp 18    Ht 5\' 7"  (1.702 m)    Wt 63.5 kg    SpO2 100%    BMI 21.93 kg/m  Physical Exam Vitals and nursing note reviewed.  Constitutional:      General: She is not in acute distress.    Appearance: Normal appearance. She is not toxic-appearing.  HENT:     Head: Atraumatic.  Cardiovascular:     Rate and Rhythm: Normal rate and regular rhythm.     Pulses: Normal pulses.  Pulmonary:     Effort: Pulmonary effort is normal.  Abdominal:     Palpations: Abdomen is soft.     Tenderness: There is no abdominal tenderness.  Musculoskeletal:        General: No swelling.     Cervical back: Normal range of motion. No tenderness.     Right lower leg: No edema.     Left lower leg: No edema.     Comments: Patient has some mild tenderness with range of motion of the extremity, no significant tenderness of the right hip..  perform straight leg raise bilaterally.  No calf tenderness, erythema or edema   Skin:    General: Skin is warm.     Capillary Refill: Capillary refill takes less than 2 seconds.     Findings: No erythema.  Neurological:     General: No focal deficit present.     Mental Status: She is alert.     Motor: No weakness.    ED Results / Procedures / Treatments   Labs (all labs ordered are listed, but only abnormal results are displayed) Labs Reviewed  CBC WITH DIFFERENTIAL/PLATELET - Abnormal; Notable for the following components:      Result Value   RBC 2.69 (*)    Hemoglobin 9.2 (*)    HCT 29.9 (*)    MCV 111.2 (*)    MCH 34.2 (*)    RDW 17.4 (*)    Platelets 146 (*)    Lymphs Abs 0.6 (*)    All other components within normal limits  BASIC METABOLIC PANEL - Abnormal; Notable for the  following components:   Sodium 133 (*)    Chloride 93 (*)    Glucose, Bld 124 (*)    BUN 41 (*)    Creatinine, Ser 6.53 (*)    GFR, Estimated 6 (*)    Anion gap 16 (*)    All other components within normal limits  URINALYSIS, ROUTINE W REFLEX MICROSCOPIC    EKG None  Radiology DG Chest 2 View  Result Date:  03/03/2021 CLINICAL DATA:  Fall.  Shortness of breath. EXAM: CHEST - 2 VIEW COMPARISON:  10/27/2020. FINDINGS: Stable cardiac enlargement. Aortic atherosclerotic calcifications. There is persistent and progressive increase perihilar opacification in both upper lobes concerning for underlying adenopathy. There is diffuse pulmonary vascular congestion. Atelectasis is noted in the left lung base. IMPRESSION: 1. Progressive bilateral upper lobe perihilar opacification concerning for underlying adenopathy. Recommend further evaluation with contrast enhanced CT of the chest. 2. Cardiac enlargement with pulmonary vascular congestion. Electronically Signed   By: Kerby Moors M.D.   On: 03/03/2021 05:56   DG Thoracic Spine 2 View  Result Date: 03/03/2021 CLINICAL DATA:  81 year old female status post fall with pain. EXAM: THORACIC SPINE 2 VIEWS COMPARISON:  CTA chest 06/06/2020. FINDINGS: Thoracic segmentation appears normal. Bone mineralization is within normal limits for age. Flowing thoracic endplate osteophytes resulting in occasional interbody ankylosis as demonstrated on the CTA last year. Stable vertebral height and alignment. Relatively preserved disc spaces. Cervicothoracic junction alignment is within normal limits. No acute osseous abnormality identified. Calcified aortic atherosclerosis. Stable visible thoracic visceral contours. IMPRESSION: No acute osseous abnormality identified in the thoracic spine. Diffuse idiopathic skeletal hyperostosis (DISH). Electronically Signed   By: Genevie Ann M.D.   On: 03/03/2021 05:55   DG Lumbar Spine Complete  Result Date: 03/03/2021 CLINICAL DATA:   81 year old female status post fall with pain. EXAM: LUMBAR SPINE - COMPLETE 4+ VIEW COMPARISON:  Thoracic radiographs today. Lumbar radiographs 01/29/2021. CT Abdomen and Pelvis 08/09/2018. FINDINGS: Transitional lumbosacral anatomy with lumbarized S1 level. Chronic dextroconvex lumbar scoliosis. Widespread chronic disc and endplate degeneration with extensive lumbar vacuum disc. Chronic interbody ankylosis L2-L3. And possible L4-L5 interbody ankylosis, new since 2020. Grossly intact visible sacrum. No acute osseous abnormality identified. Extensive Aortoiliac calcified atherosclerosis. IMPRESSION: 1. No acute osseous abnormality identified in the lumbar spine. 2. Transitional lumbosacral anatomy with lumbarized S1 level. Chronic dextroconvex lumbar scoliosis and widespread severe disc and endplate degeneration. Chronic ankylosis at L2-L3, and possible progressive L4-L5 ankylosis since 2020. 3.  Aortic Atherosclerosis (ICD10-I70.0). Electronically Signed   By: Genevie Ann M.D.   On: 03/03/2021 05:58   DG Shoulder Right  Result Date: 03/04/2021 CLINICAL DATA:  Right shoulder pain after fall yesterday. EXAM: RIGHT SHOULDER - 2+ VIEW COMPARISON:  Right shoulder x-rays dated February 01, 2021. FINDINGS: No acute fracture or dislocation. Unchanged mild glenohumeral and moderate acromioclavicular joint osteoarthritis. Osteopenia. Soft tissues are unremarkable. IMPRESSION: 1. No acute osseous abnormality. Electronically Signed   By: Titus Dubin M.D.   On: 03/04/2021 13:07   CT HEAD WO CONTRAST (5MM)  Result Date: 03/03/2021 CLINICAL DATA:  Head trauma.  Status post fall. EXAM: CT HEAD WITHOUT CONTRAST CT CERVICAL SPINE WITHOUT CONTRAST TECHNIQUE: Multidetector CT imaging of the head and cervical spine was performed following the standard protocol without intravenous contrast. Multiplanar CT image reconstructions of the cervical spine were also generated. RADIATION DOSE REDUCTION: This exam was performed according  to the departmental dose-optimization program which includes automated exposure control, adjustment of the mA and/or kV according to patient size and/or use of iterative reconstruction technique. COMPARISON:  02/11/2021 FINDINGS: CT HEAD FINDINGS Brain: No evidence of acute infarction, hemorrhage, hydrocephalus, extra-axial collection or mass lesion/mass effect. There is moderate patchy and confluent areas of low-attenuation within the subcortical and periventricular white matter compatible with chronic microvascular disease. Encephalomalacia within the right parieto-occipital lobe appears unchanged from 02/01/2021. Vascular: No hyperdense vessel or unexpected calcification. Skull: Normal. Negative for fracture or focal lesion. Sinuses/Orbits: No  acute finding. Other: None CT CERVICAL SPINE FINDINGS Alignment: Normal. Skull base and vertebrae: No acute fracture. No primary bone lesion or focal pathologic process. Soft tissues and spinal canal: No prevertebral fluid or swelling. No visible canal hematoma. Disc levels: There is ankylosis of the C3 through C5 vertebra. Marked degenerative disc disease is identified at C5-6 and C6-7. Mild degenerative disc disease noted at C2-3. Upper chest: Negative. Other: None IMPRESSION: 1. No acute intracranial abnormality. 2. Chronic microvascular disease and right parieto-occipital lobe encephalomalacia. 3. No evidence for cervical spine fracture or subluxation. 4. Advanced cervical degenerative disc disease as above. Electronically Signed   By: Kerby Moors M.D.   On: 03/03/2021 05:45   CT Angio Chest Pulmonary Embolism (PE) W or WO Contrast  Result Date: 03/03/2021 CLINICAL DATA:  PE suspected, fall EXAM: CT ANGIOGRAPHY CHEST WITH CONTRAST TECHNIQUE: Multidetector CT imaging of the chest was performed using the standard protocol during bolus administration of intravenous contrast. Multiplanar CT image reconstructions and MIPs were obtained to evaluate the vascular  anatomy. RADIATION DOSE REDUCTION: This exam was performed according to the departmental dose-optimization program which includes automated exposure control, adjustment of the mA and/or kV according to patient size and/or use of iterative reconstruction technique. CONTRAST:  75mL OMNIPAQUE IOHEXOL 350 MG/ML SOLN COMPARISON:  06/06/2020 FINDINGS: Cardiovascular: Satisfactory opacification of the pulmonary arteries to the segmental level. No evidence of pulmonary embolism. Cardiomegaly. Three-vessel coronary artery calcifications. Enlargement of the main pulmonary artery, measuring up to 3.8 cm in caliber. No pericardial effusion. Aortic atherosclerosis. Mediastinum/Nodes: No enlarged mediastinal, hilar, or axillary lymph nodes. Thyroid gland, trachea, and esophagus demonstrate no significant findings. Lungs/Pleura: Unchanged scarring and or atelectasis of the bilateral lung bases. No pleural effusion or pneumothorax. Upper Abdomen: No acute abnormality. Musculoskeletal: No chest wall abnormality. No acute osseous findings. Review of the MIP images confirms the above findings. IMPRESSION: 1. Negative examination for pulmonary embolism. 2. No acute airspace opacity. 3. Enlargement of the main pulmonary artery, as can be seen in pulmonary hypertension. 4. Cardiomegaly and coronary artery disease. Aortic Atherosclerosis (ICD10-I70.0). Electronically Signed   By: Delanna Ahmadi M.D.   On: 03/03/2021 09:04   CT CERVICAL SPINE WO CONTRAST  Result Date: 03/03/2021 CLINICAL DATA:  Head trauma.  Status post fall. EXAM: CT HEAD WITHOUT CONTRAST CT CERVICAL SPINE WITHOUT CONTRAST TECHNIQUE: Multidetector CT imaging of the head and cervical spine was performed following the standard protocol without intravenous contrast. Multiplanar CT image reconstructions of the cervical spine were also generated. RADIATION DOSE REDUCTION: This exam was performed according to the departmental dose-optimization program which includes  automated exposure control, adjustment of the mA and/or kV according to patient size and/or use of iterative reconstruction technique. COMPARISON:  02/11/2021 FINDINGS: CT HEAD FINDINGS Brain: No evidence of acute infarction, hemorrhage, hydrocephalus, extra-axial collection or mass lesion/mass effect. There is moderate patchy and confluent areas of low-attenuation within the subcortical and periventricular white matter compatible with chronic microvascular disease. Encephalomalacia within the right parieto-occipital lobe appears unchanged from 02/01/2021. Vascular: No hyperdense vessel or unexpected calcification. Skull: Normal. Negative for fracture or focal lesion. Sinuses/Orbits: No acute finding. Other: None CT CERVICAL SPINE FINDINGS Alignment: Normal. Skull base and vertebrae: No acute fracture. No primary bone lesion or focal pathologic process. Soft tissues and spinal canal: No prevertebral fluid or swelling. No visible canal hematoma. Disc levels: There is ankylosis of the C3 through C5 vertebra. Marked degenerative disc disease is identified at C5-6 and C6-7. Mild degenerative disc disease noted at  C2-3. Upper chest: Negative. Other: None IMPRESSION: 1. No acute intracranial abnormality. 2. Chronic microvascular disease and right parieto-occipital lobe encephalomalacia. 3. No evidence for cervical spine fracture or subluxation. 4. Advanced cervical degenerative disc disease as above. Electronically Signed   By: Kerby Moors M.D.   On: 03/03/2021 05:45   DG Hip Unilat W or Wo Pelvis 2-3 Views Right  Result Date: 03/04/2021 CLINICAL DATA:  Right hip pain after fall yesterday. EXAM: DG HIP (WITH OR WITHOUT PELVIS) 2-3V RIGHT COMPARISON:  Right hip x-rays dated February 01, 2021. FINDINGS: No acute fracture or dislocation. Osteopenia. Severe lumbar degenerative disc disease. Soft tissues are unremarkable. IMPRESSION: 1. No acute osseous abnormality. Electronically Signed   By: Titus Dubin M.D.   On:  03/04/2021 13:09    Procedures Procedures    Medications Ordered in ED Medications  Chlorhexidine Gluconate Cloth 2 % PADS 6 each (has no administration in time range)    ED Course/ Medical Decision Making/ A&P                           Medical Decision Making Amount and/or Complexity of Data Reviewed Labs: ordered. Radiology: ordered.  Risk Decision regarding hospitalization.    This patient presents to the ED for concern of right leg pain and lower extremity weakness, this involves an extensive number of treatment options, and is a complaint that carries with it a high risk of complications and morbidity.  The differential diagnosis includes occult fracture, DVT   Co morbidities that complicate the patient evaluation  Age, CHF, CKD hemodialysis   Additional history obtained:  Additional history obtained from patient's niece who is POA, patient's home health aide, prior medical records   Lab Tests:  I Ordered, and personally interpreted labs.  The pertinent results include: CBC without evidence of leukocytosis, hemoglobin today of 9.2 which appears similar to baseline from 4 months ago. Electrolytes show BUN of 41 and serum creatinine of 6.53.  Patient has not received her dialysis yesterday or today   Imaging Studies ordered:  I ordered imaging studies including x-ray of the right shoulder, pelvis and right hip, chest x-ray I independently visualized and interpreted imaging which showed chest x-ray shows stable cardiomegaly with mild vascular congestion, x-ray of the shoulder without acute fracture, x-ray of the pelvis and hip also without acute fracture I agree with the radiologist interpretation     Medicines ordered and prescription drug management:  Patient here with generalized lower extremity weakness.  Seen here yesterday and evaluated for fall.  She uses a walker at baseline, family and caregiver states she is waiting for a wheelchair.  She has home  health 3 days a week she is by herself on weekends and evenings.  Patient has POA who is her niece without significant assistance from family or friends.    Consultations Obtained:  I requested consultation with the nephrologist, Dr. Johnney Ou and discussed lab and imaging findings as well as pertinent plan - they recommend: Dialysis for tomorrow  Also discussed findings with [facility] hospitalist, Dr. Waldron Labs who is agreeable to admit.  Problem List / ED Course:  Patient agreeable to SNF placement for rehab.  I spoke with her niece who is also POA and she is also agreeable to this plan.   Reevaluation:  Patient has been evaluated by Education officer, museum and physical therapy here physical therapy is recommending skilled nursing placement.  SNF placement unavailable on weekends,.  FL 2 signed.  Patient resting comfortably, no acute distress.  Vital signs reassuring.   Dispostion:  After consideration of the diagnostic results and the patients response to treatment, I feel that the patent would benefit from hospital admission for extremity weakness and she will receive her dialysis treatment tomorrow   Received return call from patient's POA, [name].  She is agreeable to placement to skilled nursing for rehab.        Final Clinical Impression(s) / ED Diagnoses Final diagnoses:  Weakness of both lower extremities  History of recent fall    Rx / DC Orders ED Discharge Orders     None         Kem Parkinson, PA-C 03/04/21 1846    Fredia Sorrow, MD 03/06/21 [phone]

## 2021-03-04 NOTE — H&P (Signed)
TRH H&P   Patient Demographics:    Jody [name], is a 81 y.o. female  MRN: [id number]   DOB - 08-20-1940  Admit Date - 03/04/2021  Outpatient Primary MD for the patient is Monico Blitz, MD  Referring MD/NP/PA: PA Tripett  Patient coming from: home  Chief Complaint  Patient presents with   Leg Pain      HPI:    Jody [name]  is a 81 y.o. female,  with medical history significant for ESRD, diabetes mellitus, atrial fibrillation, COPD, CKD 4, diastolic CHF.  COPD, ESRD on hemodialysis on HD TTS, patient presents to ED accompanied by her home health aide 1/19, for evaluation of pain of right lower extremity and weakness upon standing, patient stated that she fell 2 days ago at home, where she was evaluated in the emergency department, she was discharged home with her nephew, patient lives alone, was unable to care of herself last evening, or get up from her chair due to weakness in her legs, she uses a walker at baseline, she missed her hemodialysis on Thursday, she denies any focal deficits, tingling, numbness she reports limited range of motion right upper extremity progressing over few weeks due to pain(she had an ED visit last December for that), he denies speech problem, confusion, shortness of breath, chest pain, nausea or vomiting - in ED her creatinine was 6.5, potassium 3.8, troponin 27, hemoglobin 9.2, platelet of 146, CTA chest with no evidence of PE, CT head with no acute findings. -Patient missed her hemodialysis yesterday as she was not the emergency department, she was unable to be dialyzed today while in ED so [facility] hospitalist consulted to admit.     Review of systems:    In addition to the HPI above,  A full 10 point Review of Systems was done, except as stated above, all other Review of Systems were negative.   With Past History of the following :    Past  Medical History:  Diagnosis Date   Anemia in chronic kidney disease (CODE) 06/05/2015   Arthritis    "bad in my legs" (12/07/2014)   Chronic atrial fibrillation ([place]) 06/05/2015   Chronic back pain    "dr said my spine is crooked"   Chronic bronchitis ([place])    "get it q yr" (12/07/2014)   CKD stage 4 due to type 2 diabetes mellitus ([place]) 06/21/3265   Complication of anesthesia    headache for 2 days after surgery in May 2019   COPD (chronic obstructive pulmonary disease) ([place])    Gait difficulty 09/02/2014   Hypercholesterolemia    Hypertension    Neuropathy 2015   in legs   NSVT (nonsustained ventricular tachycardia)    Sinus brady-tachy syndrome ([place])    Stroke ([place]) 05/2014   denies residual on 12/07/2014   Type II diabetes mellitus [facility])       Past Surgical  History:  Procedure Laterality Date   ABDOMINAL HYSTERECTOMY  ~ 1970   APPENDECTOMY  ~ [place] Right 07/06/2017   Procedure: RIGHT RADIOCEPHALIC ARTERIOVENOUS FISTULA creation;  Surgeon: Conrad Collbran, MD;  Location: [facility];  Service: Vascular;  Laterality: Right;   [place] Right 09/17/2017   Procedure: SECOND STAGE BASILIC VEIN TRANSPOSITION RIGHT ARM;  Surgeon: Rosetta Posner, MD;  Location: [place];  Service: Vascular;  Laterality: Right;   COLONOSCOPY N/A 10/28/2017   Procedure: COLONOSCOPY;  Surgeon: Rogene Houston, MD;  [facility];  Service: Endoscopy;  Laterality: N/A;   COLONOSCOPY WITH PROPOFOL N/A 12/29/2019   Procedure: COLONOSCOPY WITH PROPOFOL;  Surgeon: Eloise Harman, DO;  [facility];  Service: Endoscopy;  Laterality: N/A;   ESOPHAGOGASTRODUODENOSCOPY (EGD) WITH PROPOFOL N/A 10/31/2017   Procedure: ESOPHAGOGASTRODUODENOSCOPY (EGD) WITH PROPOFOL;  Surgeon: Rogene Houston, MD;  [facility];  Service: Endoscopy;  Laterality: N/A;   JOINT REPLACEMENT     TOTAL KNEE ARTHROPLASTY Right ~ [place]  ~ 1970   "9# in my stomach"       Social History:     Social History   Tobacco Use   Smoking status: Former    Packs/day: 0.50    Years: 50.00    Pack years: 25.00    Types: Cigarettes    Start date: 03/07/1953    Quit date: 01/14/2004    Years since quitting: 17.1   Smokeless tobacco: Never   Tobacco comments:    "quit smoking cigarettes  in ~ 2005"  Substance Use Topics   Alcohol use: No    Alcohol/week: 0.0 standard drinks    Comment: 10/242016 "quit drinking beer in ~ 1977"       Family History :     Family History  Problem Relation Age of Onset   Cancer Other    Heart attack Brother    Cancer Sister        breast   Alcohol abuse Brother      Home Medications:   Prior to Admission medications   Medication Sig Start Date End Date Taking? Authorizing Provider  albuterol (ACCUNEB) 0.63 MG/3ML nebulizer solution Take 1 ampule by nebulization every 6 (six) hours as needed for wheezing.   Yes [provider]  albuterol (PROVENTIL HFA;VENTOLIN HFA) 108 (90 BASE) MCG/ACT inhaler Inhale 1-2 puffs into the lungs every 6 (six) hours as needed for wheezing or shortness of breath. Reported on 03/01/2015   Yes [provider]  apixaban (ELIQUIS) 2.5 MG TABS tablet Take by mouth 2 (two) times daily.   Yes [provider]  diltiazem (CARDIZEM CD) 120 MG 24 hr capsule Take 120 mg by mouth daily. 02/02/21  Yes [provider]  fluticasone-salmeterol (ADVAIR HFA) 230-21 MCG/ACT inhaler Inhale 2 puffs into the lungs 2 (two) times daily.   Yes [provider]  gabapentin (NEURONTIN) 300 MG capsule Take 1 capsule by mouth at bedtime. 04/27/17  Yes [provider]  diltiazem (TIAZAC) 120 MG 24 hr capsule Take 120 mg by mouth daily.    [provider]  furosemide (LASIX) 40 MG tablet Take 40 mg by mouth.    [provider]  gabapentin (NEURONTIN) 100 MG capsule Take 100 mg by mouth 3 (three) times daily.    [provider]  magnesium  hydroxide (MILK OF MAGNESIA) 400 MG/5ML suspension Take 15 mLs by mouth daily as  needed for mild constipation.    [provider]  metoprolol tartrate (LOPRESSOR) 25 MG tablet Take 0.5 tablets (12.5 mg total) by mouth 2 (two) times daily. Patient not taking: Reported on 01/12/2021 10/18/16   Kathie Dike, MD  pantoprazole (PROTONIX) 40 MG tablet Take 1 tablet (40 mg total) by mouth daily. 12/31/19 12/30/20  Manuella Ghazi, Pratik D, DO  pramipexole (MIRAPEX) 0.125 MG tablet Take 0.125 mg by mouth daily.    [provider]  sevelamer carbonate (RENVELA) 800 MG tablet Take 800 mg by mouth 3 (three) times daily with meals.  07/22/19   [provider]  sodium bicarbonate 650 MG tablet Take 1 tablet (650 mg total) by mouth 2 (two) times daily. Patient not taking: Reported on 01/12/2021 10/17/16   TatShanon Brow, MD  TRESIBA FLEXTOUCH 100 UNIT/ML FlexTouch Pen Inject 12 Units into the skin daily in the afternoon.  Patient not taking: Reported on 01/12/2021 07/23/19   [provider]     Allergies:     Allergies  Allergen Reactions   Codeine Nausea And Vomiting     Physical Exam:   Vitals  Blood pressure 140/75, pulse 100, temperature 98.1 F (36.7 C), temperature source Oral, resp. rate 16, height 5\' 7"  (1.702 m), weight 63.5 kg, SpO2 100 %.   1. General frail, deconditioned elderly female laying in bed in no apparent distress  2. Normal affect and insight, Not Suicidal or Homicidal, Awake Alert, Oriented X 3.  3. No F.N deficits, ALL C.Nerves Intact, right upper extremity movement is limited due to pain and right shoulder .  Difficult to assess neurological exam otherwise, but she is noted to have some generalized right-sided weakness.  4. Ears and Eyes appear Normal, Conjunctivae clear, PERRLA. Moist Oral Mucosa.  5. Supple Neck, No JVD, No cervical lymphadenopathy appriciated, No Carotid Bruits.  6. Symmetrical Chest wall movement, Good air movement bilaterally,  CTAB.  7. RRR, No Gallops, Rubs or Murmurs, No Parasternal Heave.  8. Positive Bowel Sounds, Abdomen Soft, No tenderness, No organomegaly appriciated,No rebound -guarding or rigidity.  9.  No Cyanosis, Normal Skin Turgor, No Skin Rash or Bruise.  10. Good muscle tone,  joints appear normal , no effusions,      Data Review:    CBC Recent Labs  Lab 03/03/21 0608 03/04/21 1233  WBC 7.8 6.5  HGB 9.3* 9.2*  HCT 30.6* 29.9*  PLT 151 146*  MCV 112.9* 111.2*  MCH 34.3* 34.2*  MCHC 30.4 30.8  RDW 17.6* 17.4*  LYMPHSABS 0.7 0.6*  MONOABS 0.5 0.5  EOSABS 0.1 0.0  BASOSABS 0.0 0.0   ------------------------------------------------------------------------------------------------------------------  Chemistries  Recent Labs  Lab 03/03/21 0608 03/04/21 1233  NA 136 133*  K 3.5 3.8  CL 94* 93*  CO2 26 24  GLUCOSE 105* 124*  BUN 29* 41*  CREATININE 5.34* 6.53*  CALCIUM 9.6 9.2   ------------------------------------------------------------------------------------------------------------------ estimated creatinine clearance is 6.5 mL/min (A) (by C-G formula based on SCr of 6.53 mg/dL (H)). ------------------------------------------------------------------------------------------------------------------ No results for input(s): TSH, T4TOTAL, T3FREE, THYROIDAB in the last 72 hours.  Invalid input(s): FREET3  Coagulation profile No results for input(s): INR, PROTIME in the last 168 hours. ------------------------------------------------------------------------------------------------------------------- No results for input(s): DDIMER in the last 72 hours. -------------------------------------------------------------------------------------------------------------------  Cardiac Enzymes No results for input(s): CKMB, TROPONINI, MYOGLOBIN in the last 168 hours.  Invalid input(s):  CK ------------------------------------------------------------------------------------------------------------------    Component Value Date/Time   BNP 1,411.0 (H) 03/03/2021 8841     ---------------------------------------------------------------------------------------------------------------  Urinalysis  Component Value Date/Time   COLORURINE YELLOW 05/27/2014 1902   APPEARANCEUR CLEAR 05/27/2014 1902   LABSPEC 1.012 05/27/2014 1902   PHURINE 5.0 05/27/2014 1902   GLUCOSEU NEGATIVE 05/27/2014 1902   HGBUR NEGATIVE 05/27/2014 1902   BILIRUBINUR NEGATIVE 05/27/2014 1902   KETONESUR NEGATIVE 05/27/2014 1902   PROTEINUR 100 (A) 05/27/2014 1902   UROBILINOGEN 0.2 05/27/2014 1902   NITRITE NEGATIVE 05/27/2014 1902   LEUKOCYTESUR NEGATIVE 05/27/2014 1902    ----------------------------------------------------------------------------------------------------------------   Imaging Results:    DG Chest 1 View  Result Date: 03/04/2021 CLINICAL DATA:  Weakness, fall EXAM: CHEST  1 VIEW COMPARISON:  Chest radiograph dated March 03, 2020 FINDINGS: The heart is enlarged. Atherosclerotic calcification of aortic arch. Mild pulmonary vascular congestion. Low lung volumes without focal consolidation or large pleural effusion. No acute osseous abnormality. IMPRESSION: Stable cardiomegaly with mild pulmonary vascular congestion. Sign about low lung volumes without focal consolidation or pleural effusion. Electronically Signed   By: Keane Police D.O.   On: 03/04/2021 14:26   DG Chest 2 View  Result Date: 03/03/2021 CLINICAL DATA:  Fall.  Shortness of breath. EXAM: CHEST - 2 VIEW COMPARISON:  10/27/2020. FINDINGS: Stable cardiac enlargement. Aortic atherosclerotic calcifications. There is persistent and progressive increase perihilar opacification in both upper lobes concerning for underlying adenopathy. There is diffuse pulmonary vascular congestion. Atelectasis is noted in the left lung base.  IMPRESSION: 1. Progressive bilateral upper lobe perihilar opacification concerning for underlying adenopathy. Recommend further evaluation with contrast enhanced CT of the chest. 2. Cardiac enlargement with pulmonary vascular congestion. Electronically Signed   By: Kerby Moors M.D.   On: 03/03/2021 05:56   DG Thoracic Spine 2 View  Result Date: 03/03/2021 CLINICAL DATA:  81 year old female status post fall with pain. EXAM: THORACIC SPINE 2 VIEWS COMPARISON:  CTA chest 06/06/2020. FINDINGS: Thoracic segmentation appears normal. Bone mineralization is within normal limits for age. Flowing thoracic endplate osteophytes resulting in occasional interbody ankylosis as demonstrated on the CTA last year. Stable vertebral height and alignment. Relatively preserved disc spaces. Cervicothoracic junction alignment is within normal limits. No acute osseous abnormality identified. Calcified aortic atherosclerosis. Stable visible thoracic visceral contours. IMPRESSION: No acute osseous abnormality identified in the thoracic spine. Diffuse idiopathic skeletal hyperostosis (DISH). Electronically Signed   By: Genevie Ann M.D.   On: 03/03/2021 05:55   DG Lumbar Spine Complete  Result Date: 03/03/2021 CLINICAL DATA:  81 year old female status post fall with pain. EXAM: LUMBAR SPINE - COMPLETE 4+ VIEW COMPARISON:  Thoracic radiographs today. Lumbar radiographs 01/29/2021. CT Abdomen and Pelvis 08/09/2018. FINDINGS: Transitional lumbosacral anatomy with lumbarized S1 level. Chronic dextroconvex lumbar scoliosis. Widespread chronic disc and endplate degeneration with extensive lumbar vacuum disc. Chronic interbody ankylosis L2-L3. And possible L4-L5 interbody ankylosis, new since 2020. Grossly intact visible sacrum. No acute osseous abnormality identified. Extensive Aortoiliac calcified atherosclerosis. IMPRESSION: 1. No acute osseous abnormality identified in the lumbar spine. 2. Transitional lumbosacral anatomy with lumbarized S1  level. Chronic dextroconvex lumbar scoliosis and widespread severe disc and endplate degeneration. Chronic ankylosis at L2-L3, and possible progressive L4-L5 ankylosis since 2020. 3.  Aortic Atherosclerosis (ICD10-I70.0). Electronically Signed   By: Genevie Ann M.D.   On: 03/03/2021 05:58   DG Shoulder Right  Result Date: 03/04/2021 CLINICAL DATA:  Right shoulder pain after fall yesterday. EXAM: RIGHT SHOULDER - 2+ VIEW COMPARISON:  Right shoulder x-rays dated February 01, 2021. FINDINGS: No acute fracture or dislocation. Unchanged mild glenohumeral and moderate acromioclavicular joint osteoarthritis. Osteopenia. Soft tissues are unremarkable.  IMPRESSION: 1. No acute osseous abnormality. Electronically Signed   By: Titus Dubin M.D.   On: 03/04/2021 13:07   CT HEAD WO CONTRAST (5MM)  Result Date: 03/03/2021 CLINICAL DATA:  Head trauma.  Status post fall. EXAM: CT HEAD WITHOUT CONTRAST CT CERVICAL SPINE WITHOUT CONTRAST TECHNIQUE: Multidetector CT imaging of the head and cervical spine was performed following the standard protocol without intravenous contrast. Multiplanar CT image reconstructions of the cervical spine were also generated. RADIATION DOSE REDUCTION: This exam was performed according to the departmental dose-optimization program which includes automated exposure control, adjustment of the mA and/or kV according to patient size and/or use of iterative reconstruction technique. COMPARISON:  02/11/2021 FINDINGS: CT HEAD FINDINGS Brain: No evidence of acute infarction, hemorrhage, hydrocephalus, extra-axial collection or mass lesion/mass effect. There is moderate patchy and confluent areas of low-attenuation within the subcortical and periventricular white matter compatible with chronic microvascular disease. Encephalomalacia within the right parieto-occipital lobe appears unchanged from 02/01/2021. Vascular: No hyperdense vessel or unexpected calcification. Skull: Normal. Negative for fracture or  focal lesion. Sinuses/Orbits: No acute finding. Other: None CT CERVICAL SPINE FINDINGS Alignment: Normal. Skull base and vertebrae: No acute fracture. No primary bone lesion or focal pathologic process. Soft tissues and spinal canal: No prevertebral fluid or swelling. No visible canal hematoma. Disc levels: There is ankylosis of the C3 through C5 vertebra. Marked degenerative disc disease is identified at C5-6 and C6-7. Mild degenerative disc disease noted at C2-3. Upper chest: Negative. Other: None IMPRESSION: 1. No acute intracranial abnormality. 2. Chronic microvascular disease and right parieto-occipital lobe encephalomalacia. 3. No evidence for cervical spine fracture or subluxation. 4. Advanced cervical degenerative disc disease as above. Electronically Signed   By: Kerby Moors M.D.   On: 03/03/2021 05:45   CT Angio Chest Pulmonary Embolism (PE) W or WO Contrast  Result Date: 03/03/2021 CLINICAL DATA:  PE suspected, fall EXAM: CT ANGIOGRAPHY CHEST WITH CONTRAST TECHNIQUE: Multidetector CT imaging of the chest was performed using the standard protocol during bolus administration of intravenous contrast. Multiplanar CT image reconstructions and MIPs were obtained to evaluate the vascular anatomy. RADIATION DOSE REDUCTION: This exam was performed according to the departmental dose-optimization program which includes automated exposure control, adjustment of the mA and/or kV according to patient size and/or use of iterative reconstruction technique. CONTRAST:  109mL OMNIPAQUE IOHEXOL 350 MG/ML SOLN COMPARISON:  06/06/2020 FINDINGS: Cardiovascular: Satisfactory opacification of the pulmonary arteries to the segmental level. No evidence of pulmonary embolism. Cardiomegaly. Three-vessel coronary artery calcifications. Enlargement of the main pulmonary artery, measuring up to 3.8 cm in caliber. No pericardial effusion. Aortic atherosclerosis. Mediastinum/Nodes: No enlarged mediastinal, hilar, or axillary lymph  nodes. Thyroid gland, trachea, and esophagus demonstrate no significant findings. Lungs/Pleura: Unchanged scarring and or atelectasis of the bilateral lung bases. No pleural effusion or pneumothorax. Upper Abdomen: No acute abnormality. Musculoskeletal: No chest wall abnormality. No acute osseous findings. Review of the MIP images confirms the above findings. IMPRESSION: 1. Negative examination for pulmonary embolism. 2. No acute airspace opacity. 3. Enlargement of the main pulmonary artery, as can be seen in pulmonary hypertension. 4. Cardiomegaly and coronary artery disease. Aortic Atherosclerosis (ICD10-I70.0). Electronically Signed   By: Delanna Ahmadi M.D.   On: 03/03/2021 09:04   CT CERVICAL SPINE WO CONTRAST  Result Date: 03/03/2021 CLINICAL DATA:  Head trauma.  Status post fall. EXAM: CT HEAD WITHOUT CONTRAST CT CERVICAL SPINE WITHOUT CONTRAST TECHNIQUE: Multidetector CT imaging of the head and cervical spine was performed following the standard protocol  without intravenous contrast. Multiplanar CT image reconstructions of the cervical spine were also generated. RADIATION DOSE REDUCTION: This exam was performed according to the departmental dose-optimization program which includes automated exposure control, adjustment of the mA and/or kV according to patient size and/or use of iterative reconstruction technique. COMPARISON:  02/11/2021 FINDINGS: CT HEAD FINDINGS Brain: No evidence of acute infarction, hemorrhage, hydrocephalus, extra-axial collection or mass lesion/mass effect. There is moderate patchy and confluent areas of low-attenuation within the subcortical and periventricular white matter compatible with chronic microvascular disease. Encephalomalacia within the right parieto-occipital lobe appears unchanged from 02/01/2021. Vascular: No hyperdense vessel or unexpected calcification. Skull: Normal. Negative for fracture or focal lesion. Sinuses/Orbits: No acute finding. Other: None CT CERVICAL  SPINE FINDINGS Alignment: Normal. Skull base and vertebrae: No acute fracture. No primary bone lesion or focal pathologic process. Soft tissues and spinal canal: No prevertebral fluid or swelling. No visible canal hematoma. Disc levels: There is ankylosis of the C3 through C5 vertebra. Marked degenerative disc disease is identified at C5-6 and C6-7. Mild degenerative disc disease noted at C2-3. Upper chest: Negative. Other: None IMPRESSION: 1. No acute intracranial abnormality. 2. Chronic microvascular disease and right parieto-occipital lobe encephalomalacia. 3. No evidence for cervical spine fracture or subluxation. 4. Advanced cervical degenerative disc disease as above. Electronically Signed   By: Kerby Moors M.D.   On: 03/03/2021 05:45   DG Hip Unilat W or Wo Pelvis 2-3 Views Right  Result Date: 03/04/2021 CLINICAL DATA:  Right hip pain after fall yesterday. EXAM: DG HIP (WITH OR WITHOUT PELVIS) 2-3V RIGHT COMPARISON:  Right hip x-rays dated February 01, 2021. FINDINGS: No acute fracture or dislocation. Osteopenia. Severe lumbar degenerative disc disease. Soft tissues are unremarkable. IMPRESSION: 1. No acute osseous abnormality. Electronically Signed   By: Titus Dubin M.D.   On: 03/04/2021 13:09      Assessment & Plan:    Principal Problem:   Fall Active Problems:   Essential hypertension   Gait difficulty   COPD (chronic obstructive pulmonary disease) (HCC)   Anemia in chronic kidney disease, Stage IV   Chronic diastolic CHF (congestive heart failure) (HCC)   Type 2 diabetes mellitus with nephropathy ([place])   ESRD on dialysis [facility])   Multiple falls -Patient is deconditioned, weak, with unsteady gait, PT/OT were consulted, recommendation for SNF placement, per TOC the 1 be able to be done till Monday. -TOC consulted, will consult PT/OT to keep following on the floor till patient is placed Monday -We will obtain MRI brain to rule out any ischemic events contributing to these  falls.  ESRD -She missed her hemodialysis yesterday, ED physician discussed with renal, plan to be dialyzed tomorrow, currently no indication for emergent dialysis today.  Atrial fibrillation, chronic -Continue with diltiazem and metoprolol for heart rate control -Continue with Eliquis.   Chronic diastolic CHF -To be stable, no evidence of volume overload, volume management with dialysis, continue with home dose Lasix.     Diabetes mellitus -continue with insulin sliding scale during hospital stay  -Does not appear to be on any home medications   Hypertension - -Continue with metoprolol and Cardizem   COPD -No wheezing, no dyspnea, continue with as needed albuterol and home Advair.  Stroke -stable.  She is on Eliquis.  DVT Prophylaxis Eliquis  AM Labs Ordered, also please review Full Orders  Family Communication: Admission, patients condition and plan of care including tests being ordered have been discussed with the patient  who indicate understanding and agree  with the plan and Code Status.  Code Status full  Likely DC to  SNF  Condition GUARDED    Consults called: renal by ED    Admission status: observation    Time spent in minutes : 55 minutes   Phillips Climes M.D on 03/04/2021 at 6:49 PM   [facility] - Office  [phone]

## 2021-03-04 NOTE — Plan of Care (Signed)
°  Problem: Acute Rehab PT Goals(only PT should resolve) Goal: Pt Will Go Supine/Side To Sit Outcome: Progressing Flowsheets (Taken 03/04/2021 1550) Pt will go Supine/Side to Sit:  with minimal assist  with moderate assist Goal: Patient Will Transfer Sit To/From Stand Outcome: Progressing Flowsheets (Taken 03/04/2021 1550) Patient will transfer sit to/from stand:  with minimal assist  with moderate assist Goal: Pt Will Transfer Bed To Chair/Chair To Bed Outcome: Progressing Flowsheets (Taken 03/04/2021 1550) Pt will Transfer Bed to Chair/Chair to Bed:  with min assist  with mod assist Goal: Pt Will Ambulate Outcome: Progressing Flowsheets (Taken 03/04/2021 1550) Pt will Ambulate:  15 feet  with moderate assist  with rolling walker   3:51 PM, 03/04/21 Lonell Grandchild, MPT Physical Therapist with [facility] [phone] office [phone] mobile phone

## 2021-03-04 NOTE — ED Triage Notes (Addendum)
Pt brought in by [facility] from home with c/o right lower leg pain. Pt unable to report when the pain started. Pt also c/o right shoulder pain. Pt fell 2 days ago and was evaluated at ED at that time. Pt missed her dialysis yesterday because she was at the hospital.

## 2021-03-04 NOTE — ED Notes (Signed)
Dietary contacted to confirm diet order has been placed.

## 2021-03-04 NOTE — ED Notes (Signed)
Patient transported to X-ray 

## 2021-03-04 NOTE — ED Notes (Signed)
Jody [name] [phone]

## 2021-03-04 NOTE — NC FL2 (Addendum)
[place] MEDICAID FL2 LEVEL OF CARE SCREENING TOOL     IDENTIFICATION  Patient [name] Birthdate: 02-14-1940 Sex: female Admission Date (Current Location): 03/04/2021  [facility] and [place] Number:  [employer] and Address:  [place] [address] [place]      Provider Number: [id number]  Attending Physician Name and Address:  Fredia Sorrow, MD  Relative Name and Phone Number:       Current Level of Care: Hospital Recommended Level of Care: [place] Prior Approval Number:    Date Approved/Denied:   PASRR Number: [id number]  Discharge Plan: SNF    Current Diagnoses: Patient Active Problem List   Diagnosis Date Noted   Rectal bleeding 12/27/2019   Acute GI bleeding 12/27/2019   NSVT (nonsustained ventricular tachycardia) 10/27/2019   Acute respiratory failure with hypoxia ([place]) 10/26/2019   Megaloblastic anemia 10/26/2019   Chronic blood loss anemia 10/26/2017   Arthritis 10/26/2017   Acute on chronic blood loss anemia 10/26/2017   Iron deficiency anemia 02/23/2017   Chronic diastolic CHF (congestive heart failure) ([place]) 10/15/2016   Elevated troponin 10/15/2016   Type 2 diabetes mellitus with nephropathy ([place]) 10/15/2016   ESRD on dialysis ([place]) 10/15/2016   NSTEMI (non-ST elevated myocardial infarction) ([place]) 10/03/2015   Elevated LFTs 10/03/2015   Encounter for therapeutic drug monitoring 07/29/2015   COPD (chronic obstructive pulmonary disease) ([place]) 06/05/2015   CKD stage 4 due to type 2 diabetes mellitus ([place]) 06/05/2015   Anemia in chronic kidney disease, Stage IV 40/81/4481   Acute diastolic HF (heart failure) ([place]) 06/05/2015   Atrial fibrillation ([place]) 06/05/2015   Atrial fibrillation with rapid ventricular response ([place]) 12/07/2014   TIA (transient ischemic attack) 09/02/2014   Gait difficulty 09/02/2014   Other specified transient cerebral ischemias    Stroke with cerebral  ischemia [facility])    Hemispheric carotid artery syndrome    Essential hypertension    Hyperlipidemia    Type 2 diabetes mellitus with other circulatory complications (HCC)    Cerebral infarction ([place]) 05/27/2014    Orientation RESPIRATION BLADDER Height & Weight     Self, Time, Situation, Place  O2 (2L) Continent Weight: 63.5 kg Height:  5\' 7"  (170.2 cm)  BEHAVIORAL SYMPTOMS/MOOD NEUROLOGICAL BOWEL NUTRITION STATUS      Continent Diet (see dc summary)  AMBULATORY STATUS COMMUNICATION OF NEEDS Skin   Extensive Assist                           Personal Care Assistance Level of Assistance  Bathing, Feeding, Dressing Bathing Assistance: Limited assistance Feeding assistance: Independent Dressing Assistance: Limited assistance     Functional Limitations Info  Sight, Hearing, Speech Sight Info: Adequate Hearing Info: Adequate Speech Info: Adequate    SPECIAL CARE FACTORS FREQUENCY  PT (By licensed PT), OT (By licensed OT)     PT Frequency: 5x week OT Frequency: 3x week            Contractures Contractures Info: Not present    Additional Factors Info  Code Status, Allergies Code Status Info: Full Allergies Info: Codeine           Current Medications (03/04/2021):  This is the current hospital active medication list No current facility-administered medications for this encounter.   Current Outpatient Medications  Medication Sig Dispense Refill   albuterol (ACCUNEB) 0.63 MG/3ML nebulizer solution Take 1 ampule by nebulization every 6 (six) hours as needed  for wheezing.     albuterol (PROVENTIL HFA;VENTOLIN HFA) 108 (90 BASE) MCG/ACT inhaler Inhale 1-2 puffs into the lungs every 6 (six) hours as needed for wheezing or shortness of breath. Reported on 03/01/2015     apixaban (ELIQUIS) 2.5 MG TABS tablet Take by mouth 2 (two) times daily.     atorvastatin (LIPITOR) 40 MG tablet Take 1 tablet by mouth at bedtime.   0   diltiazem (TIAZAC) 120 MG 24 hr capsule Take 120  mg by mouth daily.     fluticasone-salmeterol (ADVAIR HFA) 230-21 MCG/ACT inhaler Inhale 2 puffs into the lungs 2 (two) times daily.     furosemide (LASIX) 40 MG tablet Take 40 mg by mouth.     gabapentin (NEURONTIN) 100 MG capsule Take 100 mg by mouth 3 (three) times daily.     magnesium hydroxide (MILK OF MAGNESIA) 400 MG/5ML suspension Take 15 mLs by mouth daily as needed for mild constipation.     metoprolol tartrate (LOPRESSOR) 25 MG tablet Take 0.5 tablets (12.5 mg total) by mouth 2 (two) times daily. (Patient not taking: Reported on 01/12/2021) 30 tablet 0   pantoprazole (PROTONIX) 40 MG tablet Take 1 tablet (40 mg total) by mouth daily. 30 tablet 1   pramipexole (MIRAPEX) 0.125 MG tablet Take 0.125 mg by mouth daily.     sevelamer carbonate (RENVELA) 800 MG tablet Take 800 mg by mouth 3 (three) times daily with meals.      sodium bicarbonate 650 MG tablet Take 1 tablet (650 mg total) by mouth 2 (two) times daily. (Patient not taking: Reported on 01/12/2021) 60 tablet 1   TRESIBA FLEXTOUCH 100 UNIT/ML FlexTouch Pen Inject 12 Units into the skin daily in the afternoon.  (Patient not taking: Reported on 01/12/2021)       Discharge Medications: Please see discharge summary for a list of discharge medications.  Relevant Imaging Results:  Relevant Lab Results:   Additional Information SS# [id number]    PASSR name  Montaque Dionne Ano, RN

## 2021-03-04 NOTE — Evaluation (Signed)
Physical Therapy Evaluation Patient Details [name] MRN: [id number] DOB: Nov 14, 1940 Today's Date: 03/04/2021  History of Present Illness  Jody [name] is a 81 y/o female. Pt brought in by [facility] from home with c/o right lower leg pain. Pt unable to report when the pain started. Pt also c/o right shoulder pain. Pt fell 2 days ago and was evaluated at ED at that time. Pt missed her dialysis yesterday because she was at the hospital.   Clinical Impression  Patient demonstrates slow labored movement for sitting up at bedside with limited use of RUE due c/o severe pain in right shoulder, very unsteady on feet, at high risk for falls, and limited to a few shuffling side steps at bedside before having to sit due to generalized weakness.  Patient put back to bed with Mod/max assist.  Patient will benefit from continued skilled physical therapy in hospital and recommended venue below to increase strength, balance, endurance for safe ADLs and gait.         Recommendations for follow up therapy are one component of a multi-disciplinary discharge planning process, led by the attending physician.  Recommendations may be updated based on patient status, additional functional criteria and insurance authorization.  Follow Up Recommendations Skilled nursing-short term rehab (<3 hours/day)    Assistance Recommended at Discharge Intermittent Supervision/Assistance  Patient can return home with the following  A lot of help with walking and/or transfers;A lot of help with bathing/dressing/bathroom;Help with stairs or ramp for entrance;Assistance with cooking/housework    Equipment Recommendations None recommended by PT  Recommendations for Other Services       Functional Status Assessment Patient has had a recent decline in their functional status and demonstrates the ability to make significant improvements in function in a reasonable and predictable amount of time.     Precautions /  Restrictions Precautions Precautions: Fall Restrictions Weight Bearing Restrictions: No      Mobility  Bed Mobility Overal bed mobility: Needs Assistance Bed Mobility: Supine to Sit, Sit to Supine     Supine to sit: Mod assist, Max assist Sit to supine: Mod assist, Max assist   General bed mobility comments: slow labored movement c/o increased pain right shoulder    Transfers Overall transfer level: Needs assistance Equipment used: Rolling walker (2 wheels) Transfers: Sit to/from Stand, Bed to chair/wheelchair/BSC Sit to Stand: Mod assist   Step pivot transfers: Mod assist       General transfer comment: unsteady labored movement    Ambulation/Gait Ambulation/Gait assistance: Mod assist, Max assist Gait Distance (Feet): 3 Feet Assistive device: Rolling walker (2 wheels) Gait Pattern/deviations: Decreased step length - right, Decreased step length - left, Decreased stance time - right, Decreased stride length, Shuffle Gait velocity: slow     General Gait Details: limited to a few slow labored shuffling side steps before having to sit due to c/o weakness  Stairs            Wheelchair Mobility    Modified Rankin (Stroke Patients Only)       Balance Overall balance assessment: Needs assistance Sitting-balance support: Feet supported, No upper extremity supported Sitting balance-Leahy Scale: Fair Sitting balance - Comments: seated at EOB   Standing balance support: Reliant on assistive device for balance, During functional activity, Bilateral upper extremity supported Standing balance-Leahy Scale: Poor Standing balance comment: using RW  Pertinent Vitals/Pain Pain Assessment Pain Assessment: Faces Faces Pain Scale: Hurts even more Pain Location: right shoulder, leg Pain Descriptors / Indicators: Discomfort, Guarding, Grimacing, Sore Pain Intervention(s): Limited activity within patient's tolerance, Monitored  during session, Repositioned    Home Living Family/patient expects to be discharged to:: Private residence Living Arrangements: Alone Available Help at Discharge: Family;Available PRN/intermittently Type of Home: House Home Access: Level entry       Home Layout: One level Home Equipment: Conservation officer, nature (2 wheels);Cane - single point;BSC/3in1;Shower seat - built in      Prior Function Prior Level of Function : Needs assist       Physical Assist : Mobility (physical);ADLs (physical) Mobility (physical): Bed mobility;Transfers;Gait;Stairs   Mobility Comments: Household ambulator using RW ADLs Comments: home aides in AM M-F     Hand Dominance   Dominant Hand: Right    Extremity/Trunk Assessment   Upper Extremity Assessment Upper Extremity Assessment: Generalized weakness;RUE deficits/detail RUE Deficits / Details: grossly -3/5 RUE: Unable to fully assess due to pain RUE Sensation: WNL RUE Coordination: WNL    Lower Extremity Assessment Lower Extremity Assessment: Generalized weakness    Cervical / Trunk Assessment Cervical / Trunk Assessment: Normal  Communication   Communication: No difficulties  Cognition Arousal/Alertness: Awake/alert Behavior During Therapy: WFL for tasks assessed/performed Overall Cognitive Status: Within Functional Limits for tasks assessed                                          General Comments      Exercises     Assessment/Plan    PT Assessment Patient needs continued PT services  PT Problem List Decreased strength;Decreased activity tolerance;Decreased balance;Decreased mobility       PT Treatment Interventions Functional mobility training;Stair training;Gait training;DME instruction;Therapeutic activities;Therapeutic exercise;Balance training;Patient/family education    PT Goals (Current goals can be found in the Care Plan section)  Acute Rehab PT Goals Patient Stated Goal: return home with home aides and  family to assist PT Goal Formulation: With patient Time For Goal Achievement: 03/18/21 Potential to Achieve Goals: Good    Frequency Min 2X/week     Co-evaluation               AM-PAC PT "6 Clicks" Mobility  Outcome Measure Help needed turning from your back to your side while in a flat bed without using bedrails?: A Lot Help needed moving from lying on your back to sitting on the side of a flat bed without using bedrails?: A Lot Help needed moving to and from a bed to a chair (including a wheelchair)?: A Lot Help needed standing up from a chair using your arms (e.g., wheelchair or bedside chair)?: A Lot Help needed to walk in hospital room?: A Lot Help needed climbing 3-5 steps with a railing? : Total 6 Click Score: 11    End of Session Equipment Utilized During Treatment: Oxygen Activity Tolerance: Patient tolerated treatment well;Patient limited by fatigue;Patient limited by pain Patient left: in bed;with call bell/phone within reach Nurse Communication: Mobility status PT Visit Diagnosis: Unsteadiness on feet (R26.81);Other abnormalities of gait and mobility (R26.89);Muscle weakness (generalized) (M62.81)    Time: 1497-0263 PT Time Calculation (min) (ACUTE ONLY): 22 min   Charges:   PT Evaluation $PT Eval Moderate Complexity: 1 Mod PT Treatments $Therapeutic Activity: 8-22 mins        3:49 PM, 03/04/21 Lonell Grandchild, MPT  Physical Therapist with [facility] [phone] office [phone] mobile phone

## 2021-03-05 DIAGNOSIS — D631 Anemia in chronic kidney disease: Secondary | ICD-10-CM | POA: Diagnosis not present

## 2021-03-05 DIAGNOSIS — Z87891 Personal history of nicotine dependence: Secondary | ICD-10-CM | POA: Diagnosis not present

## 2021-03-05 DIAGNOSIS — I132 Hypertensive heart and chronic kidney disease with heart failure and with stage 5 chronic kidney disease, or end stage renal disease: Secondary | ICD-10-CM | POA: Diagnosis not present

## 2021-03-05 DIAGNOSIS — W19XXXA Unspecified fall, initial encounter: Secondary | ICD-10-CM | POA: Diagnosis not present

## 2021-03-05 DIAGNOSIS — Z20822 Contact with and (suspected) exposure to covid-19: Secondary | ICD-10-CM | POA: Diagnosis not present

## 2021-03-05 DIAGNOSIS — I5030 Unspecified diastolic (congestive) heart failure: Secondary | ICD-10-CM | POA: Diagnosis not present

## 2021-03-05 DIAGNOSIS — N186 End stage renal disease: Secondary | ICD-10-CM | POA: Diagnosis not present

## 2021-03-05 DIAGNOSIS — Z96651 Presence of right artificial knee joint: Secondary | ICD-10-CM | POA: Diagnosis not present

## 2021-03-05 DIAGNOSIS — E1122 Type 2 diabetes mellitus with diabetic chronic kidney disease: Secondary | ICD-10-CM | POA: Diagnosis not present

## 2021-03-05 DIAGNOSIS — M79604 Pain in right leg: Secondary | ICD-10-CM | POA: Diagnosis not present

## 2021-03-05 DIAGNOSIS — Z79899 Other long term (current) drug therapy: Secondary | ICD-10-CM | POA: Diagnosis not present

## 2021-03-05 DIAGNOSIS — I5032 Chronic diastolic (congestive) heart failure: Secondary | ICD-10-CM | POA: Diagnosis not present

## 2021-03-05 DIAGNOSIS — Z7901 Long term (current) use of anticoagulants: Secondary | ICD-10-CM | POA: Diagnosis not present

## 2021-03-05 DIAGNOSIS — Z992 Dependence on renal dialysis: Secondary | ICD-10-CM | POA: Diagnosis not present

## 2021-03-05 DIAGNOSIS — R531 Weakness: Secondary | ICD-10-CM | POA: Diagnosis not present

## 2021-03-05 DIAGNOSIS — E1129 Type 2 diabetes mellitus with other diabetic kidney complication: Secondary | ICD-10-CM | POA: Diagnosis not present

## 2021-03-05 DIAGNOSIS — J449 Chronic obstructive pulmonary disease, unspecified: Secondary | ICD-10-CM | POA: Diagnosis not present

## 2021-03-05 LAB — RENAL FUNCTION PANEL
Albumin: 2.8 g/dL — ABNORMAL LOW (ref 3.5–5.0)
Anion gap: 14 (ref 5–15)
BUN: 49 mg/dL — ABNORMAL HIGH (ref 8–23)
CO2: 24 mmol/L (ref 22–32)
Calcium: 8.7 mg/dL — ABNORMAL LOW (ref 8.9–10.3)
Chloride: 95 mmol/L — ABNORMAL LOW (ref 98–111)
Creatinine, Ser: 7.64 mg/dL — ABNORMAL HIGH (ref 0.44–1.00)
GFR, Estimated: 5 mL/min — ABNORMAL LOW (ref >60)
Glucose, Bld: 100 mg/dL — ABNORMAL HIGH (ref 70–99)
Phosphorus: 8.5 mg/dL — ABNORMAL HIGH (ref 2.5–4.6)
Potassium: 4.2 mmol/L (ref 3.5–5.1)
Sodium: 133 mmol/L — ABNORMAL LOW (ref 135–145)

## 2021-03-05 LAB — CBC
HCT: 24.3 % — ABNORMAL LOW (ref 36.0–46.0)
Hemoglobin: 7.5 g/dL — ABNORMAL LOW (ref 12.0–15.0)
MCH: 34.1 pg — ABNORMAL HIGH (ref 26.0–34.0)
MCHC: 30.9 g/dL (ref 30.0–36.0)
MCV: 110.5 fL — ABNORMAL HIGH (ref 80.0–100.0)
Platelets: 120 10*3/uL — ABNORMAL LOW (ref 150–400)
RBC: 2.2 MIL/uL — ABNORMAL LOW (ref 3.87–5.11)
RDW: 17 % — ABNORMAL HIGH (ref 11.5–15.5)
WBC: 5.1 10*3/uL (ref 4.0–10.5)
nRBC: 0 % (ref 0.0–0.2)

## 2021-03-05 LAB — HEMOGLOBIN AND HEMATOCRIT, BLOOD
HCT: 24.9 % — ABNORMAL LOW (ref 36.0–46.0)
Hemoglobin: 7.7 g/dL — ABNORMAL LOW (ref 12.0–15.0)

## 2021-03-05 LAB — GLUCOSE, CAPILLARY
Glucose-Capillary: 97 mg/dL (ref 70–99)
Glucose-Capillary: 116 mg/dL — ABNORMAL HIGH (ref 70–99)
Glucose-Capillary: 132 mg/dL — ABNORMAL HIGH (ref 70–99)

## 2021-03-05 MED ORDER — LIDOCAINE HCL (PF) 1 % IJ SOLN: 5.0000 mL | INTRAMUSCULAR | Status: DC | PRN

## 2021-03-05 MED ORDER — GLUCAGON HCL RDNA (DIAGNOSTIC) 1 MG IJ SOLR
1.0000 mg | Freq: Once | INTRAMUSCULAR | Status: AC | PRN
  Administered 2021-03-05: 1 mg via INTRAVENOUS
  Filled 2021-03-05: qty 1

## 2021-03-05 MED ORDER — GLUCAGON HCL RDNA (DIAGNOSTIC) 1 MG IJ SOLR
1.0000 mg | Freq: Once | INTRAMUSCULAR | Status: DC | PRN
Start: 2021-03-05 — End: 2021-03-05

## 2021-03-05 MED ORDER — PENTAFLUOROPROP-TETRAFLUOROETH EX AERO: 1.0000 "application " | INHALATION_SPRAY | CUTANEOUS | Status: DC | PRN

## 2021-03-05 MED ORDER — ALBUMIN HUMAN 25 % IV SOLN
25.0000 g | INTRAVENOUS | Status: AC
  Administered 2021-03-05: 25 g via INTRAVENOUS

## 2021-03-05 MED ORDER — LIDOCAINE-PRILOCAINE 2.5-2.5 % EX CREA: 1.0000 "application " | TOPICAL_CREAM | CUTANEOUS | Status: DC | PRN

## 2021-03-05 MED ORDER — SODIUM CHLORIDE 0.9 % IV SOLN: 100.0000 mL | INTRAVENOUS | Status: DC | PRN

## 2021-03-05 MED ORDER — GLUCAGON HCL RDNA (DIAGNOSTIC) 1 MG IJ SOLR: 1.0000 mg | Freq: Once | INTRAMUSCULAR | Status: DC | PRN

## 2021-03-05 MED ORDER — SEVELAMER CARBONATE 800 MG PO TABS
800.0000 mg | ORAL_TABLET | Freq: Three times a day (TID) | ORAL | Status: DC
  Administered 2021-03-06 – 2021-03-07 (×5): 800 mg via ORAL
  Filled 2021-03-05 (×4): qty 1

## 2021-03-05 NOTE — Progress Notes (Signed)
PROGRESS NOTE    Jody [name]  JJO:[id number] DOB: 1940/04/29 DOA: 03/04/2021 PCP: Monico Blitz, MD   Brief Narrative:  This 81 years old female with PMH significant for ESRD on hemodialysis TTS, diabetes mellitus, atrial fibrillation, COPD, CKD stage IV, diastolic CHF presented in the ED accompanied by her home health aide for the evaluation of pain in the right lower extremity and weakness upon standing s/p fall 2 days ago at home.  Patient was evaluated in the ED and she was discharged home with her nephew.  Patient lives alone and unable to care for herself, she was unable to get up from her chair due to weakness in the legs.  She uses walker at baseline.  She has missed her hemodialysis on Thursday.  In the ED her creatinine was found to be 6.5. CTA chest with no evidence of PE, CT head without acute findings.  Patient is admitted for missed hemodialysis. She will probably need placement after medically clear.  Assessment & Plan:   Principal Problem:   Fall Active Problems:   Essential hypertension   Gait difficulty   COPD (chronic obstructive pulmonary disease) (HCC)   Anemia in chronic kidney disease, Stage IV   Chronic diastolic CHF (congestive heart failure) (HCC)   Type 2 diabetes mellitus with nephropathy (HCC)   ESRD on dialysis ([place])   Pressure injury of skin  Multiple falls: Patient is very deconditioned, weak with unsteady gait. PT/OT were consulted, recommended SNF placement. TOC consulted, continue PT and OT until discharge on Monday. Obtain MRI to rule out any ischemic events contributing to these falls.  ESRD: She has missed her hemodialysis yesterday. ED physician has spoken with nephrology, plan is to dialyze today. Continue hemodialysis as per schedule.  Chronic atrial fibrillation: Continue diltiazem, metoprolol for heart rate control Continue Eliquis.  Chronic diastolic CHF: There is no evidence of volume overload. Volume management with  dialysis. Continue home dose of Lasix.  Diabetes mellitus: Continue regular insulin sliding scale. She does not appear to be on any home diabetic medications. Obtain hemoglobin A1c  Essential hypertension: Continue metoprolol and Cardizem  COPD: Continue home inhalers and Advair.  History of CVA: Continue Eliquis.  DVT prophylaxis: Eliquis Code Status: Full code. Family Communication: No family at bed side. Disposition Plan:    Status is: Observation  The patient remains OBS appropriate and will d/c before 2 midnights.  Patient admitted for missed hemodialysis and eventually need placement since she is unable to take care of herself at home.   Consultants:  Nephrology  Procedures:None Antimicrobials:  Anti-infectives (From admission, onward)    None        Subjective: Patient was seen and examined at bedside.  Overnight events noted.   Patient reports feeling better.  She is due for dialysis today.  Patient denies any dizziness, palpitation or shortness of breath.  Objective: Vitals:   03/04/21 2211 03/05/21 0548 03/05/21 0755 03/05/21 0820  BP: 110/73 107/62  109/84  Pulse: 94 70  72  Resp:    18  Temp:  98.9 F (37.2 C)  98.6 F (37 C)  TempSrc:  Oral  Oral  SpO2:  94% 95% 97%  Weight:      Height:        Intake/Output Summary (Last 24 hours) at 03/05/2021 1110 Last data filed at 03/05/2021 0900 Gross per 24 hour  Intake 480 ml  Output 0 ml  Net 480 ml   Filed Weights   03/04/21 0931  Weight:  63.5 kg    Examination:  General exam: Appears comfortable, not in any acute distress, deconditioned. Respiratory system: Clear to auscultation bilaterally, respiratory effort normal, RR 15 Cardiovascular system: S1-S2 heard, regular rate and rhythm, no murmur. Gastrointestinal system: Abdomen is soft, nontender, nondistended, BS+ Central nervous system: Alert and oriented x 2. No focal neurological deficits. Extremities: No edema, no cyanosis, no  clubbing, AV fistula noted in right arm. Skin: No rashes, lesions or ulcers Psychiatry:  Mood & affect appropriate.     Data Reviewed: I have personally reviewed following labs and imaging studies  CBC: Recent Labs  Lab 03/03/21 0608 03/04/21 1233 03/05/21 0411 03/05/21 0821  WBC 7.8 6.5 5.1  --   NEUTROABS 6.5 5.3  --   --   HGB 9.3* 9.2* 7.5* 7.7*  HCT 30.6* 29.9* 24.3* 24.9*  MCV 112.9* 111.2* 110.5*  --   PLT 151 146* 120*  --    Basic Metabolic Panel: Recent Labs  Lab 03/03/21 0608 03/04/21 1233 03/05/21 0411  NA 136 133* 133*  K 3.5 3.8 4.2  CL 94* 93* 95*  CO2 26 24 24   GLUCOSE 105* 124* 100*  BUN 29* 41* 49*  CREATININE 5.34* 6.53* 7.64*  CALCIUM 9.6 9.2 8.7*  PHOS  --   --  8.5*   GFR: Estimated Creatinine Clearance: 5.5 mL/min (A) (by C-G formula based on SCr of 7.64 mg/dL (H)). Liver Function Tests: Recent Labs  Lab 03/05/21 0411  ALBUMIN 2.8*   No results for input(s): LIPASE, AMYLASE in the last 168 hours. No results for input(s): AMMONIA in the last 168 hours. Coagulation Profile: No results for input(s): INR, PROTIME in the last 168 hours. Cardiac Enzymes: No results for input(s): CKTOTAL, CKMB, CKMBINDEX, TROPONINI in the last 168 hours. BNP (last 3 results) No results for input(s): PROBNP in the last 8760 hours. HbA1C: No results for input(s): HGBA1C in the last 72 hours. CBG: Recent Labs  Lab 03/04/21 2012 03/05/21 0726 03/05/21 1059  GLUCAP 162* 97 116*   Lipid Profile: No results for input(s): CHOL, HDL, LDLCALC, TRIG, CHOLHDL, LDLDIRECT in the last 72 hours. Thyroid Function Tests: No results for input(s): TSH, T4TOTAL, FREET4, T3FREE, THYROIDAB in the last 72 hours. Anemia Panel: No results for input(s): VITAMINB12, FOLATE, FERRITIN, TIBC, IRON, RETICCTPCT in the last 72 hours. Sepsis Labs: No results for input(s): PROCALCITON, LATICACIDVEN in the last 168 hours.  Recent Results (from the past 240 hour(s))  Resp Panel by  RT-PCR (Flu A&B, Covid) Nasopharyngeal Swab     Status: None   Collection Time: 03/04/21  5:09 PM   Specimen: Nasopharyngeal Swab; Nasopharyngeal(NP) swabs in vial transport medium  Result Value Ref Range Status   SARS Coronavirus 2 by RT PCR NEGATIVE NEGATIVE Final    Comment: (NOTE) SARS-CoV-2 target nucleic acids are NOT DETECTED.  The SARS-CoV-2 RNA is generally detectable in upper respiratory specimens during the acute phase of infection. The lowest concentration of SARS-CoV-2 viral copies this assay can detect is 138 copies/mL. A negative result does not preclude SARS-Cov-2 infection and should not be used as the sole basis for treatment or other patient management decisions. A negative result may occur with  improper specimen collection/handling, submission of specimen other than nasopharyngeal swab, presence of viral mutation(s) within the areas targeted by this assay, and inadequate number of viral copies(<138 copies/mL). A negative result must be combined with clinical observations, patient history, and epidemiological information. The expected result is Negative.  Fact Sheet for Patients:  EntrepreneurPulse.com.au  Fact Sheet for Healthcare Providers:  IncredibleEmployment.be  This test is no t yet approved or cleared by the [place] FDA and  has been authorized for detection and/or diagnosis of SARS-CoV-2 by FDA under an Emergency Use Authorization (EUA). This EUA will remain  in effect (meaning this test can be used) for the duration of the COVID-19 declaration under Section 564(b)(1) of the Act, 21 U.S.C.section 360bbb-3(b)(1), unless the authorization is terminated  or revoked sooner.       Influenza A by PCR NEGATIVE NEGATIVE Final   Influenza B by PCR NEGATIVE NEGATIVE Final    Comment: (NOTE) The Xpert Xpress SARS-CoV-2/FLU/RSV plus assay is intended as an aid in the diagnosis of influenza from Nasopharyngeal swab  specimens and should not be used as a sole basis for treatment. Nasal washings and aspirates are unacceptable for Xpert Xpress SARS-CoV-2/FLU/RSV testing.  Fact Sheet for Patients: EntrepreneurPulse.com.au  Fact Sheet for Healthcare Providers: IncredibleEmployment.be  This test is not yet approved or cleared by the [place] FDA and has been authorized for detection and/or diagnosis of SARS-CoV-2 by FDA under an Emergency Use Authorization (EUA). This EUA will remain in effect (meaning this test can be used) for the duration of the COVID-19 declaration under Section 564(b)(1) of the Act, 21 U.S.C. section 360bbb-3(b)(1), unless the authorization is terminated or revoked.  Performed at [facility], [address], [place], [place] [address]     Radiology Studies: DG Chest 1 View  Result Date: 03/04/2021 CLINICAL DATA:  Weakness, fall EXAM: CHEST  1 VIEW COMPARISON:  Chest radiograph dated March 03, 2020 FINDINGS: The heart is enlarged. Atherosclerotic calcification of aortic arch. Mild pulmonary vascular congestion. Low lung volumes without focal consolidation or large pleural effusion. No acute osseous abnormality. IMPRESSION: Stable cardiomegaly with mild pulmonary vascular congestion. Sign about low lung volumes without focal consolidation or pleural effusion. Electronically Signed   By: Keane Police D.O.   On: 03/04/2021 14:26   DG Shoulder Right  Result Date: 03/04/2021 CLINICAL DATA:  Right shoulder pain after fall yesterday. EXAM: RIGHT SHOULDER - 2+ VIEW COMPARISON:  Right shoulder x-rays dated February 01, 2021. FINDINGS: No acute fracture or dislocation. Unchanged mild glenohumeral and moderate acromioclavicular joint osteoarthritis. Osteopenia. Soft tissues are unremarkable. IMPRESSION: 1. No acute osseous abnormality. Electronically Signed   By: Titus Dubin M.D.   On: 03/04/2021 13:07   DG Hip Unilat W or Wo Pelvis 2-3 Views  Right  Result Date: 03/04/2021 CLINICAL DATA:  Right hip pain after fall yesterday. EXAM: DG HIP (WITH OR WITHOUT PELVIS) 2-3V RIGHT COMPARISON:  Right hip x-rays dated February 01, 2021. FINDINGS: No acute fracture or dislocation. Osteopenia. Severe lumbar degenerative disc disease. Soft tissues are unremarkable. IMPRESSION: 1. No acute osseous abnormality. Electronically Signed   By: Titus Dubin M.D.   On: 03/04/2021 13:09    Scheduled Meds:  apixaban  2.5 mg Oral BID   Chlorhexidine Gluconate Cloth  6 each Topical Q0600   diltiazem  120 mg Oral Daily   furosemide  40 mg Oral Daily   insulin aspart  0-5 Units Subcutaneous QHS   insulin aspart  0-9 Units Subcutaneous TID WC   metoprolol tartrate  12.5 mg Oral BID   mometasone-formoterol  2 puff Inhalation BID   Continuous Infusions:   LOS: 0 days    Time spent: 50 mins    Lynsee Wands, MD [facility]   If 7PM-7AM, please contact night-coverage

## 2021-03-05 NOTE — Procedures (Signed)
° °  HEMODIALYSIS TREATMENT NOTE:   Hypotensive throughout session with SBP 80s-90s.  Dialysate was cooled, Albumin was given at the start of treatment yet BP remained too low to safely ultrafiltrate.  She was asymptomatic with these soft pressures.  3 hour session completed.  NO fluid removed and net + 890 cc.  All blood was returned and hemostasis was achieved in 15 minutes.     Rockwell Alexandria, RN

## 2021-03-05 NOTE — Consult Note (Signed)
[facility]  INPATIENT CONSULTATION  Reason for Consultation: ESRD, dialysis Requesting Provider: Dr. Dwyane Dee  HPI: Malayasia Mirkin is an 81 y.o. female with ESRD on HD TTS, DM, A fib, COPD, HFpEF currently admitted for weakness and nephrology is consulted for evaluation and management of her ESRD and assoc conditions.   Had a fall 2 days ago and was in ED all day so missed her HD - it was arranged for following day which was yesterday but she returned to the ED due to ongoing weakness so missed the make up HD.  She's subsequently been admitted to the hospital for inability to care for self at home with plans for SNF placement.   She's been found to have Hb 7.5 from 9s 03/04/21.  She denies any bleeding.  She says she has a viral URI that's mild; afebrile but causing some wheezing.  COVID and flu negative.   She is scheduled to have HD today.  No recent issues with HD>   PMH: Past Medical History:  Diagnosis Date   Anemia in chronic kidney disease (CODE) 06/05/2015   Arthritis    "bad in my legs" (12/07/2014)   Chronic atrial fibrillation ([place]) 06/05/2015   Chronic back pain    "dr said my spine is crooked"   Chronic bronchitis ([place])    "get it q yr" (12/07/2014)   CKD stage 4 due to type 2 diabetes mellitus ([place]) 02/14/5850   Complication of anesthesia    headache for 2 days after surgery in May 2019   COPD (chronic obstructive pulmonary disease) ([place])    Gait difficulty 09/02/2014   Hypercholesterolemia    Hypertension    Neuropathy 2015   in legs   NSVT (nonsustained ventricular tachycardia)    Sinus brady-tachy syndrome ([place])    Stroke ([place]) 05/2014   denies residual on 12/07/2014   Type II diabetes mellitus (HCC)    PSH: Past Surgical History:  Procedure Laterality Date   ABDOMINAL HYSTERECTOMY  ~ [place]  ~ [place] Right 07/06/2017   Procedure: RIGHT RADIOCEPHALIC ARTERIOVENOUS FISTULA creation;  Surgeon: Conrad DuPage, MD;   Location: [facility];  Service: Vascular;  Laterality: Right;   [place] Right 09/17/2017   Procedure: SECOND STAGE BASILIC VEIN TRANSPOSITION RIGHT ARM;  Surgeon: Rosetta Posner, MD;  Location: [facility] OR;  Service: Vascular;  Laterality: Right;   COLONOSCOPY N/A 10/28/2017   Procedure: COLONOSCOPY;  Surgeon: Rogene Houston, MD;  [facility];  Service: Endoscopy;  Laterality: N/A;   COLONOSCOPY WITH PROPOFOL N/A 12/29/2019   Procedure: COLONOSCOPY WITH PROPOFOL;  Surgeon: Eloise Harman, DO;  [facility];  Service: Endoscopy;  Laterality: N/A;   ESOPHAGOGASTRODUODENOSCOPY (EGD) WITH PROPOFOL N/A 10/31/2017   Procedure: ESOPHAGOGASTRODUODENOSCOPY (EGD) WITH PROPOFOL;  Surgeon: Rogene Houston, MD;  [facility];  Service: Endoscopy;  Laterality: N/A;   JOINT REPLACEMENT     TOTAL KNEE ARTHROPLASTY Right ~ [place]  ~ 1970   "9# in my stomach"    Past Medical History:  Diagnosis Date   Anemia in chronic kidney disease (CODE) 06/05/2015   Arthritis    "bad in my legs" (12/07/2014)   Chronic atrial fibrillation ([place]) 06/05/2015   Chronic back pain    "dr said my spine is crooked"   Chronic bronchitis ([place])    "get it q yr" (12/07/2014)   CKD stage 4 due to type  2 diabetes mellitus ([place]) 9/56/3875   Complication of anesthesia    headache for 2 days after surgery in May 2019   COPD (chronic obstructive pulmonary disease) ([place])    Gait difficulty 09/02/2014   Hypercholesterolemia    Hypertension    Neuropathy 2015   in legs   NSVT (nonsustained ventricular tachycardia)    Sinus brady-tachy syndrome ([place])    Stroke ([place]) 05/2014   denies residual on 12/07/2014   Type II diabetes mellitus ([place])     Medications:  I have reviewed the patient's current medications.  Medications Prior to Admission  Medication Sig Dispense Refill   albuterol (ACCUNEB) 0.63 MG/3ML nebulizer solution Take 1 ampule by nebulization every 6 (six)  hours as needed for wheezing.     albuterol (PROVENTIL HFA;VENTOLIN HFA) 108 (90 BASE) MCG/ACT inhaler Inhale 1-2 puffs into the lungs every 6 (six) hours as needed for wheezing or shortness of breath. Reported on 03/01/2015     apixaban (ELIQUIS) 2.5 MG TABS tablet Take by mouth 2 (two) times daily.     diltiazem (CARDIZEM CD) 120 MG 24 hr capsule Take 120 mg by mouth daily.     fluticasone-salmeterol (ADVAIR HFA) 230-21 MCG/ACT inhaler Inhale 2 puffs into the lungs 2 (two) times daily.     furosemide (LASIX) 40 MG tablet Take 40 mg by mouth.     gabapentin (NEURONTIN) 100 MG capsule Take 100 mg by mouth 3 (three) times daily.     gabapentin (NEURONTIN) 300 MG capsule Take 1 capsule by mouth at bedtime. (Patient not taking: Reported on 03/04/2021)     magnesium hydroxide (MILK OF MAGNESIA) 400 MG/5ML suspension Take 15 mLs by mouth daily as needed for mild constipation. (Patient not taking: Reported on 03/04/2021)     metoprolol tartrate (LOPRESSOR) 25 MG tablet Take 0.5 tablets (12.5 mg total) by mouth 2 (two) times daily. (Patient not taking: Reported on 01/12/2021) 30 tablet 0   pantoprazole (PROTONIX) 40 MG tablet Take 1 tablet (40 mg total) by mouth daily. (Patient not taking: Reported on 03/04/2021) 30 tablet 1   pramipexole (MIRAPEX) 0.125 MG tablet Take 0.125 mg by mouth daily.     sevelamer carbonate (RENVELA) 800 MG tablet Take 800 mg by mouth 3 (three) times daily with meals.      sodium bicarbonate 650 MG tablet Take 1 tablet (650 mg total) by mouth 2 (two) times daily. (Patient not taking: Reported on 01/12/2021) 60 tablet 1   TRESIBA FLEXTOUCH 100 UNIT/ML FlexTouch Pen Inject 12 Units into the skin daily in the afternoon.  (Patient not taking: Reported on 01/12/2021)      ALLERGIES:   Allergies  Allergen Reactions   Codeine Nausea And Vomiting    FAM HX: Family History  Problem Relation Age of Onset   Cancer Other    Heart attack Brother    Cancer Sister        breast    Alcohol abuse Brother     Social History:   reports that she quit smoking about 17 years ago. Her smoking use included cigarettes. She started smoking about 68 years ago. She has a 25.00 pack-year smoking history. She has never used smokeless tobacco. She reports that she does not drink alcohol and does not use drugs.  ROS: 12 system ROS neg except per HPI above  Blood pressure (!) 102/52, pulse 65, temperature 98 F (36.7 C), temperature source Oral, resp. rate 17, height 5\' 7"  (1.702 m), weight 63.5 kg, SpO2 98 %.  PHYSICAL EXAM: Gen: elderly woman lying flat in bed on 1L  Eyes: anicteric, glasses ENT: MM dry mouth breathing Neck: no JVD CV:  HR 70s on monitor Abd: soft, nontender Lungs: ant wheezing, normal WOB flat, no rales GU: no foley Extr:  RUE AVF +t/b Neuro: conversant but slow    Results for orders placed or performed during the hospital encounter of 03/04/21 (from the past 48 hour(s))  CBC with Differential     Status: Abnormal   Collection Time: 03/04/21 12:33 PM  Result Value Ref Range   WBC 6.5 4.0 - 10.5 K/uL   RBC 2.69 (L) 3.87 - 5.11 MIL/uL   Hemoglobin 9.2 (L) 12.0 - 15.0 g/dL   HCT 29.9 (L) 36.0 - 46.0 %   MCV 111.2 (H) 80.0 - 100.0 fL   MCH 34.2 (H) 26.0 - 34.0 pg   MCHC 30.8 30.0 - 36.0 g/dL   RDW 17.4 (H) 11.5 - 15.5 %   Platelets 146 (L) 150 - 400 K/uL   nRBC 0.0 0.0 - 0.2 %   Neutrophils Relative % 83 %   Neutro Abs 5.3 1.7 - 7.7 K/uL   Lymphocytes Relative 9 %   Lymphs Abs 0.6 (L) 0.7 - 4.0 K/uL   Monocytes Relative 7 %   Monocytes Absolute 0.5 0.1 - 1.0 K/uL   Eosinophils Relative 1 %   Eosinophils Absolute 0.0 0.0 - 0.5 K/uL   Basophils Relative 0 %   Basophils Absolute 0.0 0.0 - 0.1 K/uL   WBC Morphology MORPHOLOGY UNREMARKABLE    RBC Morphology MORPHOLOGY UNREMARKABLE    Smear Review MORPHOLOGY UNREMARKABLE    Immature Granulocytes 0 %   Abs Immature Granulocytes 0.02 0.00 - 0.07 K/uL   Polychromasia PRESENT     Comment: Performed at  [facility], [address] [place], [place] [address]  Basic metabolic panel     Status: Abnormal   Collection Time: 03/04/21 12:33 PM  Result Value Ref Range   Sodium 133 (L) 135 - 145 mmol/L   Potassium 3.8 3.5 - 5.1 mmol/L   Chloride 93 (L) 98 - 111 mmol/L   CO2 24 22 - 32 mmol/L   Glucose, Bld 124 (H) 70 - 99 mg/dL    Comment: Glucose reference range applies only to samples taken after fasting for at least 8 hours.   BUN 41 (H) 8 - 23 mg/dL   Creatinine, Ser 6.53 (H) 0.44 - 1.00 mg/dL   Calcium 9.2 8.9 - 10.3 mg/dL   GFR, Estimated 6 (L) >60 mL/min    Comment: (NOTE) Calculated using the CKD-EPI Creatinine Equation (2021)    Anion gap 16 (H) 5 - 15    Comment: Performed at [facility], [address], [place], [place] [address]  Resp Panel by RT-PCR (Flu A&B, Covid) Nasopharyngeal Swab     Status: None   Collection Time: 03/04/21  5:09 PM   Specimen: Nasopharyngeal Swab; Nasopharyngeal(NP) swabs in vial transport medium  Result Value Ref Range   SARS Coronavirus 2 by RT PCR NEGATIVE NEGATIVE    Comment: (NOTE) SARS-CoV-2 target nucleic acids are NOT DETECTED.  The SARS-CoV-2 RNA is generally detectable in upper respiratory specimens during the acute phase of infection. The lowest concentration of SARS-CoV-2 viral copies this assay can detect is 138 copies/mL. A negative result does not preclude SARS-Cov-2 infection and should not be used as the sole basis for treatment or other patient management decisions. A negative result may occur with  improper specimen collection/handling,  submission of specimen other than nasopharyngeal swab, presence of viral mutation(s) within the areas targeted by this assay, and inadequate number of viral copies(<138 copies/mL). A negative result must be combined with clinical observations, patient history, and epidemiological information. The expected result is Negative.  Fact Sheet for Patients:   EntrepreneurPulse.com.au  Fact Sheet for Healthcare Providers:  IncredibleEmployment.be  This test is no t yet approved or cleared by the [place] FDA and  has been authorized for detection and/or diagnosis of SARS-CoV-2 by FDA under an Emergency Use Authorization (EUA). This EUA will remain  in effect (meaning this test can be used) for the duration of the COVID-19 declaration under Section 564(b)(1) of the Act, 21 U.S.C.section 360bbb-3(b)(1), unless the authorization is terminated  or revoked sooner.       Influenza A by PCR NEGATIVE NEGATIVE   Influenza B by PCR NEGATIVE NEGATIVE    Comment: (NOTE) The Xpert Xpress SARS-CoV-2/FLU/RSV plus assay is intended as an aid in the diagnosis of influenza from Nasopharyngeal swab specimens and should not be used as a sole basis for treatment. Nasal washings and aspirates are unacceptable for Xpert Xpress SARS-CoV-2/FLU/RSV testing.  Fact Sheet for Patients: EntrepreneurPulse.com.au  Fact Sheet for Healthcare Providers: IncredibleEmployment.be  This test is not yet approved or cleared by the [place] FDA and has been authorized for detection and/or diagnosis of SARS-CoV-2 by FDA under an Emergency Use Authorization (EUA). This EUA will remain in effect (meaning this test can be used) for the duration of the COVID-19 declaration under Section 564(b)(1) of the Act, 21 U.S.C. section 360bbb-3(b)(1), unless the authorization is terminated or revoked.  Performed at [facility], [address], [place], [place] [address]   Glucose, capillary     Status: Abnormal   Collection Time: 03/04/21  8:12 PM  Result Value Ref Range   Glucose-Capillary 162 (H) 70 - 99 mg/dL    Comment: Glucose reference range applies only to samples taken after fasting for at least 8 hours.  Renal function panel     Status: Abnormal   Collection Time: 03/05/21  4:11 AM  Result  Value Ref Range   Sodium 133 (L) 135 - 145 mmol/L   Potassium 4.2 3.5 - 5.1 mmol/L   Chloride 95 (L) 98 - 111 mmol/L   CO2 24 22 - 32 mmol/L   Glucose, Bld 100 (H) 70 - 99 mg/dL    Comment: Glucose reference range applies only to samples taken after fasting for at least 8 hours.   BUN 49 (H) 8 - 23 mg/dL   Creatinine, Ser 7.64 (H) 0.44 - 1.00 mg/dL   Calcium 8.7 (L) 8.9 - 10.3 mg/dL   Phosphorus 8.5 (H) 2.5 - 4.6 mg/dL   Albumin 2.8 (L) 3.5 - 5.0 g/dL   GFR, Estimated 5 (L) >60 mL/min    Comment: (NOTE) Calculated using the CKD-EPI Creatinine Equation (2021)    Anion gap 14 5 - 15    Comment: Performed at [facility], [address], [place], [place] [address]  CBC     Status: Abnormal   Collection Time: 03/05/21  4:11 AM  Result Value Ref Range   WBC 5.1 4.0 - 10.5 K/uL   RBC 2.20 (L) 3.87 - 5.11 MIL/uL   Hemoglobin 7.5 (L) 12.0 - 15.0 g/dL   HCT 24.3 (L) 36.0 - 46.0 %   MCV 110.5 (H) 80.0 - 100.0 fL   MCH 34.1 (H) 26.0 - 34.0 pg   MCHC 30.9 30.0 -  36.0 g/dL   RDW 17.0 (H) 11.5 - 15.5 %   Platelets 120 (L) 150 - 400 K/uL   nRBC 0.0 0.0 - 0.2 %    Comment: Performed at [facility], [address] [place], [place] [address]  Glucose, capillary     Status: None   Collection Time: 03/05/21  7:26 AM  Result Value Ref Range   Glucose-Capillary 97 70 - 99 mg/dL    Comment: Glucose reference range applies only to samples taken after fasting for at least 8 hours.  Hemoglobin and hematocrit, blood     Status: Abnormal   Collection Time: 03/05/21  8:21 AM  Result Value Ref Range   Hemoglobin 7.7 (L) 12.0 - 15.0 g/dL   HCT 24.9 (L) 36.0 - 46.0 %    Comment: Performed at [facility], [address] [place], [place] [address]  Glucose, capillary     Status: Abnormal   Collection Time: 03/05/21 10:59 AM  Result Value Ref Range   Glucose-Capillary 116 (H) 70 - 99 mg/dL    Comment: Glucose reference range applies only to samples taken after fasting for at least 8 hours.    DG  Chest 1 View  Result Date: 03/04/2021 CLINICAL DATA:  Weakness, fall EXAM: CHEST  1 VIEW COMPARISON:  Chest radiograph dated March 03, 2020 FINDINGS: The heart is enlarged. Atherosclerotic calcification of aortic arch. Mild pulmonary vascular congestion. Low lung volumes without focal consolidation or large pleural effusion. No acute osseous abnormality. IMPRESSION: Stable cardiomegaly with mild pulmonary vascular congestion. Sign about low lung volumes without focal consolidation or pleural effusion. Electronically Signed   By: Keane Police D.O.   On: 03/04/2021 14:26   DG Shoulder Right  Result Date: 03/04/2021 CLINICAL DATA:  Right shoulder pain after fall yesterday. EXAM: RIGHT SHOULDER - 2+ VIEW COMPARISON:  Right shoulder x-rays dated February 01, 2021. FINDINGS: No acute fracture or dislocation. Unchanged mild glenohumeral and moderate acromioclavicular joint osteoarthritis. Osteopenia. Soft tissues are unremarkable. IMPRESSION: 1. No acute osseous abnormality. Electronically Signed   By: Titus Dubin M.D.   On: 03/04/2021 13:07   DG Hip Unilat W or Wo Pelvis 2-3 Views Right  Result Date: 03/04/2021 CLINICAL DATA:  Right hip pain after fall yesterday. EXAM: DG HIP (WITH OR WITHOUT PELVIS) 2-3V RIGHT COMPARISON:  Right hip x-rays dated February 01, 2021. FINDINGS: No acute fracture or dislocation. Osteopenia. Severe lumbar degenerative disc disease. Soft tissues are unremarkable. IMPRESSION: 1. No acute osseous abnormality. Electronically Signed   By: Titus Dubin M.D.   On: 03/04/2021 13:09    HD orders as of 12/2019:  Center: [facility] on TTS . EDW 87kg HD Bath 2K/2.5Ca  Time 3:15 Heparin 1000 units bolus. Access RUE AVF BFR 400 DFR 600    Hectoral 2 mcg IV/HD Epogen 5000   Units IV/HD  Venofer  100 IV once a week   Assessment/Plan **Weakness, FTT: multifactorial. MRI pending. PT/OT. Plans being made for SNF placement.  **ESRD on HD: TTS, missed Thursday treatment.  Will have HD  today.  Follow TTS schedule while admitted.  Renal diet.   **Anemia:  Hb 9s > 7s in the past 24-48h.  Denies frank bleeding.  Trend while here. Further w/u PRN, maybe just a dilutional drop with missed HD>   **BMM:  Phos 8.4.  Resume binder.  Does not appear she's on VDRA - can obtain outpt HD orders if remains admitted.   **A fib:  rate controlled on diltiazem and metoprolol.   **  DM: per primary  **HTN:  BPs on low side. Really just on dilt and metoprolol for A fib rate mgmt.    Will follow, call with concerns.   Justin Mend 03/05/2021, 3:33 PM

## 2021-03-05 NOTE — Care Management Obs Status (Signed)
[facility] NOTIFICATION   Patient Details  [name] MRN: [id number] Date of Birth: Sep 12, 1940   Medicare Observation Status Notification Given:  Yes    Elliot Gurney [place], [place] 03/05/2021, 5:35 PM

## 2021-03-06 DIAGNOSIS — W19XXXA Unspecified fall, initial encounter: Secondary | ICD-10-CM | POA: Diagnosis not present

## 2021-03-06 DIAGNOSIS — I5032 Chronic diastolic (congestive) heart failure: Secondary | ICD-10-CM | POA: Diagnosis not present

## 2021-03-06 LAB — BASIC METABOLIC PANEL
Anion gap: 12 (ref 5–15)
BUN: 28 mg/dL — ABNORMAL HIGH (ref 8–23)
CO2: 27 mmol/L (ref 22–32)
Calcium: 8.7 mg/dL — ABNORMAL LOW (ref 8.9–10.3)
Chloride: 95 mmol/L — ABNORMAL LOW (ref 98–111)
Creatinine, Ser: 4.52 mg/dL — ABNORMAL HIGH (ref 0.44–1.00)
GFR, Estimated: 9 mL/min — ABNORMAL LOW (ref >60)
Glucose, Bld: 106 mg/dL — ABNORMAL HIGH (ref 70–99)
Potassium: 3.7 mmol/L (ref 3.5–5.1)
Sodium: 134 mmol/L — ABNORMAL LOW (ref 135–145)

## 2021-03-06 LAB — CBC
HCT: 22.9 % — ABNORMAL LOW (ref 36.0–46.0)
Hemoglobin: 7.1 g/dL — ABNORMAL LOW (ref 12.0–15.0)
MCH: 34 pg (ref 26.0–34.0)
MCHC: 31 g/dL (ref 30.0–36.0)
MCV: 109.6 fL — ABNORMAL HIGH (ref 80.0–100.0)
Platelets: 109 10*3/uL — ABNORMAL LOW (ref 150–400)
RBC: 2.09 MIL/uL — ABNORMAL LOW (ref 3.87–5.11)
RDW: 17 % — ABNORMAL HIGH (ref 11.5–15.5)
WBC: 4.6 10*3/uL (ref 4.0–10.5)
nRBC: 0 % (ref 0.0–0.2)

## 2021-03-06 LAB — HEPATITIS B SURFACE ANTIGEN: Hepatitis B Surface Ag: NONREACTIVE

## 2021-03-06 LAB — HEMOGLOBIN AND HEMATOCRIT, BLOOD
HCT: 24.8 % — ABNORMAL LOW (ref 36.0–46.0)
Hemoglobin: 7.5 g/dL — ABNORMAL LOW (ref 12.0–15.0)

## 2021-03-06 LAB — GLUCOSE, CAPILLARY
Glucose-Capillary: 125 mg/dL — ABNORMAL HIGH (ref 70–99)
Glucose-Capillary: 138 mg/dL — ABNORMAL HIGH (ref 70–99)
Glucose-Capillary: 161 mg/dL — ABNORMAL HIGH (ref 70–99)
Glucose-Capillary: 163 mg/dL — ABNORMAL HIGH (ref 70–99)
Glucose-Capillary: 194 mg/dL — ABNORMAL HIGH (ref 70–99)

## 2021-03-06 LAB — HEPATITIS B SURFACE ANTIBODY,QUALITATIVE: Hep B S Ab: REACTIVE — AB

## 2021-03-06 LAB — PHOSPHORUS: Phosphorus: 4.9 mg/dL — ABNORMAL HIGH (ref 2.5–4.6)

## 2021-03-06 LAB — MAGNESIUM: Magnesium: 2.1 mg/dL (ref 1.7–2.4)

## 2021-03-06 MED ORDER — TRAMADOL HCL 50 MG PO TABS
50.0000 mg | ORAL_TABLET | Freq: Once | ORAL | Status: AC
  Administered 2021-03-06: 50 mg via ORAL
  Filled 2021-03-06: qty 1

## 2021-03-06 NOTE — Progress Notes (Signed)
PROGRESS NOTE    Jody [name]  WUJ:[id number] DOB: October 12, 1940 DOA: 03/04/2021 PCP: Monico Blitz, MD   Brief Narrative:  This 81 years old female with PMH significant for ESRD on hemodialysis TTS, diabetes mellitus, atrial fibrillation, COPD, CKD stage IV, diastolic CHF presented in the ED accompanied by her home health aide for the evaluation of pain in the right lower extremity and weakness upon standing s/p fall 2 days ago at home.  Patient was evaluated in the ED and she was discharged home with her nephew.  Patient lives alone and unable to care for herself, she was unable to get up from her chair due to weakness in the legs.  She uses walker at baseline.  She has missed her hemodialysis on Thursday.  In the ED her creatinine was found to be 6.5. CTA chest with no evidence of PE, CT head without acute findings.  Patient is admitted for missed hemodialysis.  Nephrology consulted, underwent HD. She will probably need placement after medically clear.  Assessment & Plan:   Principal Problem:   Fall Active Problems:   Essential hypertension   Gait difficulty   COPD (chronic obstructive pulmonary disease) (HCC)   Anemia in chronic kidney disease, Stage IV   Chronic diastolic CHF (congestive heart failure) (HCC)   Type 2 diabetes mellitus with nephropathy (HCC)   ESRD on dialysis ([place])   Pressure injury of skin  Multiple falls: Patient is very deconditioned, weak with unsteady gait. PT/OT were consulted, recommended SNF placement. TOC consulted, continue PT and OT until discharge on Monday. Obtain MRI to rule out any ischemic events contributing to these falls.  ESRD: She has missed her hemodialysis  1/20 ED physician has spoken with nephrology, completed hemodialysis on 1/21. Continue hemodialysis as per schedule.  Chronic atrial fibrillation: Continue diltiazem, metoprolol for heart rate control Continue Eliquis.  Chronic diastolic CHF: There is no evidence of volume  overload. Volume management with dialysis. Continue home dose of Lasix.  Diabetes mellitus: Continue regular insulin sliding scale. She does not appear to be on any home diabetic medications. Obtain hemoglobin A1c  Essential hypertension: Continue metoprolol and Cardizem  COPD: Continue home inhalers and Advair.  History of CVA: Continue Eliquis.  DVT prophylaxis: Eliquis Code Status: Full code. Family Communication: No family at bed side. Disposition Plan:   Status is: Observation  The patient remains OBS appropriate and will d/c before 2 midnights.  Patient admitted for missed hemodialysis and eventually need placement since she is unable to take care of herself at home.  She underwent hemodialysis, remained hypotensive.  Patient is awaiting MRI to rule out a stroke.  Pending PT and OT evaluation.   Consultants:  Nephrology  Procedures:None Antimicrobials:  Anti-infectives (From admission, onward)    None        Subjective: Patient was seen and examined at bedside.  Overnight events noted.   Patient reports feeling much improved after getting dialysis yesterday. Patient was sitting on the chair,  reports weakness but appears back to her baseline mental status. Still walks unsteady.  Objective: Vitals:   03/05/21 2330 03/05/21 2345 03/06/21 0431 03/06/21 0937  BP: 100/61 101/66 (!) 107/55   Pulse: 67  79 61  Resp: 18 20 19 18   Temp:   98.7 F (37.1 C)   TempSrc:   Oral   SpO2:   90% 92%  Weight:      Height:        Intake/Output Summary (Last 24 hours) at 03/06/2021 1133  Last data filed at 03/06/2021 0935 Gross per 24 hour  Intake 630 ml  Output -896 ml  Net 1526 ml   Filed Weights   03/04/21 0931 03/05/21 2015  Weight: 63.5 kg 82.5 kg    Examination:  General exam: Appears comfortable, deconditioned, not in any acute distress. Respiratory system: Clear to auscultation bilaterally, respiratory effort normal, RR 13. Cardiovascular system:  S1-S2 heard, irregular rhythm, no murmur. Gastrointestinal system: Abdomen is soft, nontender, nondistended, BS+ Central nervous system: Alert and oriented x 2. No focal neurological deficits. Extremities: No edema, no cyanosis, no clubbing, AV fistula noted in right arm. Skin: No rashes, lesions or ulcers Psychiatry:  Mood & affect appropriate.     Data Reviewed: I have personally reviewed following labs and imaging studies  CBC: Recent Labs  Lab 03/03/21 0608 03/04/21 1233 03/05/21 0411 03/05/21 0821 03/06/21 0556  WBC 7.8 6.5 5.1  --  4.6  NEUTROABS 6.5 5.3  --   --   --   HGB 9.3* 9.2* 7.5* 7.7* 7.1*  HCT 30.6* 29.9* 24.3* 24.9* 22.9*  MCV 112.9* 111.2* 110.5*  --  109.6*  PLT 151 146* 120*  --  948*   Basic Metabolic Panel: Recent Labs  Lab 03/03/21 0608 03/04/21 1233 03/05/21 0411 03/06/21 0556  NA 136 133* 133* 134*  K 3.5 3.8 4.2 3.7  CL 94* 93* 95* 95*  CO2 26 24 24 27   GLUCOSE 105* 124* 100* 106*  BUN 29* 41* 49* 28*  CREATININE 5.34* 6.53* 7.64* 4.52*  CALCIUM 9.6 9.2 8.7* 8.7*  MG  --   --   --  2.1  PHOS  --   --  8.5* 4.9*   GFR: Estimated Creatinine Clearance: 10.6 mL/min (A) (by C-G formula based on SCr of 4.52 mg/dL (H)). Liver Function Tests: Recent Labs  Lab 03/05/21 0411  ALBUMIN 2.8*   No results for input(s): LIPASE, AMYLASE in the last 168 hours. No results for input(s): AMMONIA in the last 168 hours. Coagulation Profile: No results for input(s): INR, PROTIME in the last 168 hours. Cardiac Enzymes: No results for input(s): CKTOTAL, CKMB, CKMBINDEX, TROPONINI in the last 168 hours. BNP (last 3 results) No results for input(s): PROBNP in the last 8760 hours. HbA1C: No results for input(s): HGBA1C in the last 72 hours. CBG: Recent Labs  Lab 03/05/21 1059 03/05/21 1653 03/06/21 0009 03/06/21 0741 03/06/21 1111  GLUCAP 116* 132* 125* 138* 194*   Lipid Profile: No results for input(s): CHOL, HDL, LDLCALC, TRIG, CHOLHDL,  LDLDIRECT in the last 72 hours. Thyroid Function Tests: No results for input(s): TSH, T4TOTAL, FREET4, T3FREE, THYROIDAB in the last 72 hours. Anemia Panel: No results for input(s): VITAMINB12, FOLATE, FERRITIN, TIBC, IRON, RETICCTPCT in the last 72 hours. Sepsis Labs: No results for input(s): PROCALCITON, LATICACIDVEN in the last 168 hours.  Recent Results (from the past 240 hour(s))  Resp Panel by RT-PCR (Flu A&B, Covid) Nasopharyngeal Swab     Status: None   Collection Time: 03/04/21  5:09 PM   Specimen: Nasopharyngeal Swab; Nasopharyngeal(NP) swabs in vial transport medium  Result Value Ref Range Status   SARS Coronavirus 2 by RT PCR NEGATIVE NEGATIVE Final    Comment: (NOTE) SARS-CoV-2 target nucleic acids are NOT DETECTED.  The SARS-CoV-2 RNA is generally detectable in upper respiratory specimens during the acute phase of infection. The lowest concentration of SARS-CoV-2 viral copies this assay can detect is 138 copies/mL. A negative result does not preclude SARS-Cov-2 infection and should not  be used as the sole basis for treatment or other patient management decisions. A negative result may occur with  improper specimen collection/handling, submission of specimen other than nasopharyngeal swab, presence of viral mutation(s) within the areas targeted by this assay, and inadequate number of viral copies(<138 copies/mL). A negative result must be combined with clinical observations, patient history, and epidemiological information. The expected result is Negative.  Fact Sheet for Patients:  EntrepreneurPulse.com.au  Fact Sheet for Healthcare Providers:  IncredibleEmployment.be  This test is no t yet approved or cleared by the [place] FDA and  has been authorized for detection and/or diagnosis of SARS-CoV-2 by FDA under an Emergency Use Authorization (EUA). This EUA will remain  in effect (meaning this test can be used) for the  duration of the COVID-19 declaration under Section 564(b)(1) of the Act, 21 U.S.C.section 360bbb-3(b)(1), unless the authorization is terminated  or revoked sooner.       Influenza A by PCR NEGATIVE NEGATIVE Final   Influenza B by PCR NEGATIVE NEGATIVE Final    Comment: (NOTE) The Xpert Xpress SARS-CoV-2/FLU/RSV plus assay is intended as an aid in the diagnosis of influenza from Nasopharyngeal swab specimens and should not be used as a sole basis for treatment. Nasal washings and aspirates are unacceptable for Xpert Xpress SARS-CoV-2/FLU/RSV testing.  Fact Sheet for Patients: EntrepreneurPulse.com.au  Fact Sheet for Healthcare Providers: IncredibleEmployment.be  This test is not yet approved or cleared by the [place] FDA and has been authorized for detection and/or diagnosis of SARS-CoV-2 by FDA under an Emergency Use Authorization (EUA). This EUA will remain in effect (meaning this test can be used) for the duration of the COVID-19 declaration under Section 564(b)(1) of the Act, 21 U.S.C. section 360bbb-3(b)(1), unless the authorization is terminated or revoked.  Performed at [facility], [address], [place], [place] [address]     Radiology Studies: DG Chest 1 View  Result Date: 03/04/2021 CLINICAL DATA:  Weakness, fall EXAM: CHEST  1 VIEW COMPARISON:  Chest radiograph dated March 03, 2020 FINDINGS: The heart is enlarged. Atherosclerotic calcification of aortic arch. Mild pulmonary vascular congestion. Low lung volumes without focal consolidation or large pleural effusion. No acute osseous abnormality. IMPRESSION: Stable cardiomegaly with mild pulmonary vascular congestion. Sign about low lung volumes without focal consolidation or pleural effusion. Electronically Signed   By: Keane Police D.O.   On: 03/04/2021 14:26   DG Shoulder Right  Result Date: 03/04/2021 CLINICAL DATA:  Right shoulder pain after fall yesterday. EXAM: RIGHT  SHOULDER - 2+ VIEW COMPARISON:  Right shoulder x-rays dated February 01, 2021. FINDINGS: No acute fracture or dislocation. Unchanged mild glenohumeral and moderate acromioclavicular joint osteoarthritis. Osteopenia. Soft tissues are unremarkable. IMPRESSION: 1. No acute osseous abnormality. Electronically Signed   By: Titus Dubin M.D.   On: 03/04/2021 13:07   DG Hip Unilat W or Wo Pelvis 2-3 Views Right  Result Date: 03/04/2021 CLINICAL DATA:  Right hip pain after fall yesterday. EXAM: DG HIP (WITH OR WITHOUT PELVIS) 2-3V RIGHT COMPARISON:  Right hip x-rays dated February 01, 2021. FINDINGS: No acute fracture or dislocation. Osteopenia. Severe lumbar degenerative disc disease. Soft tissues are unremarkable. IMPRESSION: 1. No acute osseous abnormality. Electronically Signed   By: Titus Dubin M.D.   On: 03/04/2021 13:09    Scheduled Meds:  apixaban  2.5 mg Oral BID   Chlorhexidine Gluconate Cloth  6 each Topical Q0600   diltiazem  120 mg Oral Daily   furosemide  40 mg Oral Daily  insulin aspart  0-5 Units Subcutaneous QHS   insulin aspart  0-9 Units Subcutaneous TID WC   mometasone-formoterol  2 puff Inhalation BID   sevelamer carbonate  800 mg Oral TID WC   Continuous Infusions:  sodium chloride     sodium chloride       LOS: 0 days    Time spent: 35 mins    Silena Wyss, MD [facility]   If 7PM-7AM, please contact night-coverage

## 2021-03-06 NOTE — TOC Progression Note (Addendum)
Transition of Care [facility]) - Progression Note    Patient Details  [name] MRN: [id number] Date of Birth: 1941-01-08  Transition of Care [facility]) CM/SW Contact  [address], [name] [place], [place] Phone Number: 03/06/2021, 2:04 PM  Clinical Narrative:    Met with patient at bedside to provide bed offers. Patient chose [facility]. VM left for Ebony Hail to confirm bed offer.  03/06/21 3:49pm  It was confirmed that patient's first choice  is [facility]. Bed offer pending at this time.  Transition of Care to continue to follow  [facility], LCSW Transition of Care [phone]    Expected Discharge Plan: [place] Barriers to Discharge: Continued Medical Work up  Expected Discharge Plan and Services Expected Discharge Plan: [place]                                               Social Determinants of Health (SDOH) Interventions    Readmission Risk Interventions Readmission Risk Prevention Plan 12/30/2019 10/27/2019  Transportation Screening Complete Complete  PCP or Specialist Appt within 5-7 Days - Complete  Home Care Screening - Complete  Medication Review (RN CM) - Complete  HRI or Home Care Consult Complete -  Social Work Consult for Recovery Care Planning/Counseling Complete -  Palliative Care Screening Not Complete -  Medication Review Press photographer) Complete -  Some recent data might be hidden

## 2021-03-07 ENCOUNTER — Observation Stay (HOSPITAL_COMMUNITY): Payer: Medicare Other

## 2021-03-07 DIAGNOSIS — R413 Other amnesia: Secondary | ICD-10-CM | POA: Diagnosis not present

## 2021-03-07 DIAGNOSIS — I10 Essential (primary) hypertension: Secondary | ICD-10-CM | POA: Diagnosis not present

## 2021-03-07 DIAGNOSIS — M25141 Fistula, right hand: Secondary | ICD-10-CM | POA: Diagnosis not present

## 2021-03-07 DIAGNOSIS — R600 Localized edema: Secondary | ICD-10-CM | POA: Diagnosis not present

## 2021-03-07 DIAGNOSIS — M79604 Pain in right leg: Secondary | ICD-10-CM | POA: Diagnosis not present

## 2021-03-07 DIAGNOSIS — Z743 Need for continuous supervision: Secondary | ICD-10-CM | POA: Diagnosis not present

## 2021-03-07 DIAGNOSIS — R410 Disorientation, unspecified: Secondary | ICD-10-CM | POA: Diagnosis not present

## 2021-03-07 DIAGNOSIS — Z0000 Encounter for general adult medical examination without abnormal findings: Secondary | ICD-10-CM | POA: Diagnosis not present

## 2021-03-07 DIAGNOSIS — E1159 Type 2 diabetes mellitus with other circulatory complications: Secondary | ICD-10-CM

## 2021-03-07 DIAGNOSIS — Z79899 Other long term (current) drug therapy: Secondary | ICD-10-CM | POA: Diagnosis not present

## 2021-03-07 DIAGNOSIS — M86672 Other chronic osteomyelitis, left ankle and foot: Secondary | ICD-10-CM | POA: Diagnosis not present

## 2021-03-07 DIAGNOSIS — R2689 Other abnormalities of gait and mobility: Secondary | ICD-10-CM | POA: Diagnosis not present

## 2021-03-07 DIAGNOSIS — I70221 Atherosclerosis of native arteries of extremities with rest pain, right leg: Secondary | ICD-10-CM | POA: Diagnosis not present

## 2021-03-07 DIAGNOSIS — E11621 Type 2 diabetes mellitus with foot ulcer: Secondary | ICD-10-CM | POA: Diagnosis not present

## 2021-03-07 DIAGNOSIS — I517 Cardiomegaly: Secondary | ICD-10-CM | POA: Diagnosis not present

## 2021-03-07 DIAGNOSIS — E871 Hypo-osmolality and hyponatremia: Secondary | ICD-10-CM | POA: Diagnosis not present

## 2021-03-07 DIAGNOSIS — R269 Unspecified abnormalities of gait and mobility: Secondary | ICD-10-CM | POA: Diagnosis not present

## 2021-03-07 DIAGNOSIS — M86172 Other acute osteomyelitis, left ankle and foot: Secondary | ICD-10-CM | POA: Diagnosis not present

## 2021-03-07 DIAGNOSIS — M898X9 Other specified disorders of bone, unspecified site: Secondary | ICD-10-CM | POA: Diagnosis not present

## 2021-03-07 DIAGNOSIS — R29898 Other symptoms and signs involving the musculoskeletal system: Secondary | ICD-10-CM | POA: Diagnosis not present

## 2021-03-07 DIAGNOSIS — M79605 Pain in left leg: Secondary | ICD-10-CM | POA: Diagnosis not present

## 2021-03-07 DIAGNOSIS — E876 Hypokalemia: Secondary | ICD-10-CM | POA: Diagnosis not present

## 2021-03-07 DIAGNOSIS — I129 Hypertensive chronic kidney disease with stage 1 through stage 4 chronic kidney disease, or unspecified chronic kidney disease: Secondary | ICD-10-CM | POA: Diagnosis not present

## 2021-03-07 DIAGNOSIS — R609 Edema, unspecified: Secondary | ICD-10-CM | POA: Diagnosis not present

## 2021-03-07 DIAGNOSIS — L97529 Non-pressure chronic ulcer of other part of left foot with unspecified severity: Secondary | ICD-10-CM | POA: Diagnosis not present

## 2021-03-07 DIAGNOSIS — Z8673 Personal history of transient ischemic attack (TIA), and cerebral infarction without residual deficits: Secondary | ICD-10-CM | POA: Diagnosis not present

## 2021-03-07 DIAGNOSIS — R001 Bradycardia, unspecified: Secondary | ICD-10-CM | POA: Diagnosis not present

## 2021-03-07 DIAGNOSIS — I482 Chronic atrial fibrillation, unspecified: Secondary | ICD-10-CM | POA: Diagnosis not present

## 2021-03-07 DIAGNOSIS — Z7901 Long term (current) use of anticoagulants: Secondary | ICD-10-CM | POA: Diagnosis not present

## 2021-03-07 DIAGNOSIS — M009 Pyogenic arthritis, unspecified: Secondary | ICD-10-CM | POA: Diagnosis not present

## 2021-03-07 DIAGNOSIS — I132 Hypertensive heart and chronic kidney disease with heart failure and with stage 5 chronic kidney disease, or end stage renal disease: Secondary | ICD-10-CM | POA: Diagnosis not present

## 2021-03-07 DIAGNOSIS — Z87891 Personal history of nicotine dependence: Secondary | ICD-10-CM | POA: Diagnosis not present

## 2021-03-07 DIAGNOSIS — Z96651 Presence of right artificial knee joint: Secondary | ICD-10-CM | POA: Diagnosis not present

## 2021-03-07 DIAGNOSIS — W19XXXA Unspecified fall, initial encounter: Secondary | ICD-10-CM | POA: Diagnosis not present

## 2021-03-07 DIAGNOSIS — M7989 Other specified soft tissue disorders: Secondary | ICD-10-CM | POA: Diagnosis not present

## 2021-03-07 DIAGNOSIS — J9611 Chronic respiratory failure with hypoxia: Secondary | ICD-10-CM | POA: Diagnosis not present

## 2021-03-07 DIAGNOSIS — I70234 Atherosclerosis of native arteries of right leg with ulceration of heel and midfoot: Secondary | ICD-10-CM | POA: Diagnosis not present

## 2021-03-07 DIAGNOSIS — L97524 Non-pressure chronic ulcer of other part of left foot with necrosis of bone: Secondary | ICD-10-CM | POA: Diagnosis not present

## 2021-03-07 DIAGNOSIS — N189 Chronic kidney disease, unspecified: Secondary | ICD-10-CM | POA: Diagnosis not present

## 2021-03-07 DIAGNOSIS — M86072 Acute hematogenous osteomyelitis, left ankle and foot: Secondary | ICD-10-CM | POA: Diagnosis not present

## 2021-03-07 DIAGNOSIS — M869 Osteomyelitis, unspecified: Secondary | ICD-10-CM | POA: Diagnosis not present

## 2021-03-07 DIAGNOSIS — I5032 Chronic diastolic (congestive) heart failure: Secondary | ICD-10-CM | POA: Diagnosis not present

## 2021-03-07 DIAGNOSIS — Z20822 Contact with and (suspected) exposure to covid-19: Secondary | ICD-10-CM | POA: Diagnosis not present

## 2021-03-07 DIAGNOSIS — N390 Urinary tract infection, site not specified: Secondary | ICD-10-CM | POA: Diagnosis not present

## 2021-03-07 DIAGNOSIS — M86171 Other acute osteomyelitis, right ankle and foot: Secondary | ICD-10-CM | POA: Diagnosis not present

## 2021-03-07 DIAGNOSIS — D631 Anemia in chronic kidney disease: Secondary | ICD-10-CM | POA: Diagnosis not present

## 2021-03-07 DIAGNOSIS — I5030 Unspecified diastolic (congestive) heart failure: Secondary | ICD-10-CM | POA: Diagnosis not present

## 2021-03-07 DIAGNOSIS — I70262 Atherosclerosis of native arteries of extremities with gangrene, left leg: Secondary | ICD-10-CM | POA: Diagnosis not present

## 2021-03-07 DIAGNOSIS — N186 End stage renal disease: Secondary | ICD-10-CM | POA: Diagnosis not present

## 2021-03-07 DIAGNOSIS — G8929 Other chronic pain: Secondary | ICD-10-CM | POA: Diagnosis not present

## 2021-03-07 DIAGNOSIS — N250 Renal osteodystrophy: Secondary | ICD-10-CM | POA: Diagnosis not present

## 2021-03-07 DIAGNOSIS — R0602 Shortness of breath: Secondary | ICD-10-CM | POA: Diagnosis not present

## 2021-03-07 DIAGNOSIS — J9601 Acute respiratory failure with hypoxia: Secondary | ICD-10-CM | POA: Diagnosis not present

## 2021-03-07 DIAGNOSIS — E1129 Type 2 diabetes mellitus with other diabetic kidney complication: Secondary | ICD-10-CM | POA: Diagnosis not present

## 2021-03-07 DIAGNOSIS — Z299 Encounter for prophylactic measures, unspecified: Secondary | ICD-10-CM | POA: Diagnosis not present

## 2021-03-07 DIAGNOSIS — R279 Unspecified lack of coordination: Secondary | ICD-10-CM | POA: Diagnosis not present

## 2021-03-07 DIAGNOSIS — M545 Low back pain, unspecified: Secondary | ICD-10-CM | POA: Diagnosis not present

## 2021-03-07 DIAGNOSIS — L97521 Non-pressure chronic ulcer of other part of left foot limited to breakdown of skin: Secondary | ICD-10-CM | POA: Diagnosis not present

## 2021-03-07 DIAGNOSIS — M778 Other enthesopathies, not elsewhere classified: Secondary | ICD-10-CM | POA: Diagnosis not present

## 2021-03-07 DIAGNOSIS — G319 Degenerative disease of nervous system, unspecified: Secondary | ICD-10-CM | POA: Diagnosis not present

## 2021-03-07 DIAGNOSIS — M6281 Muscle weakness (generalized): Secondary | ICD-10-CM | POA: Diagnosis not present

## 2021-03-07 DIAGNOSIS — R0902 Hypoxemia: Secondary | ICD-10-CM | POA: Diagnosis not present

## 2021-03-07 DIAGNOSIS — I120 Hypertensive chronic kidney disease with stage 5 chronic kidney disease or end stage renal disease: Secondary | ICD-10-CM | POA: Diagnosis not present

## 2021-03-07 DIAGNOSIS — E1122 Type 2 diabetes mellitus with diabetic chronic kidney disease: Secondary | ICD-10-CM | POA: Diagnosis not present

## 2021-03-07 DIAGNOSIS — D649 Anemia, unspecified: Secondary | ICD-10-CM | POA: Diagnosis not present

## 2021-03-07 DIAGNOSIS — J449 Chronic obstructive pulmonary disease, unspecified: Secondary | ICD-10-CM | POA: Diagnosis not present

## 2021-03-07 DIAGNOSIS — R6889 Other general symptoms and signs: Secondary | ICD-10-CM | POA: Diagnosis not present

## 2021-03-07 DIAGNOSIS — R296 Repeated falls: Secondary | ICD-10-CM | POA: Diagnosis not present

## 2021-03-07 DIAGNOSIS — I739 Peripheral vascular disease, unspecified: Secondary | ICD-10-CM | POA: Diagnosis not present

## 2021-03-07 DIAGNOSIS — E1169 Type 2 diabetes mellitus with other specified complication: Secondary | ICD-10-CM | POA: Diagnosis not present

## 2021-03-07 DIAGNOSIS — Z7985 Long-term (current) use of injectable non-insulin antidiabetic drugs: Secondary | ICD-10-CM | POA: Diagnosis not present

## 2021-03-07 DIAGNOSIS — E1152 Type 2 diabetes mellitus with diabetic peripheral angiopathy with gangrene: Secondary | ICD-10-CM | POA: Diagnosis not present

## 2021-03-07 DIAGNOSIS — S91302A Unspecified open wound, left foot, initial encounter: Secondary | ICD-10-CM | POA: Diagnosis not present

## 2021-03-07 DIAGNOSIS — R531 Weakness: Secondary | ICD-10-CM | POA: Diagnosis not present

## 2021-03-07 DIAGNOSIS — N2581 Secondary hyperparathyroidism of renal origin: Secondary | ICD-10-CM | POA: Diagnosis not present

## 2021-03-07 DIAGNOSIS — I4891 Unspecified atrial fibrillation: Secondary | ICD-10-CM | POA: Diagnosis not present

## 2021-03-07 DIAGNOSIS — Z7401 Bed confinement status: Secondary | ICD-10-CM | POA: Diagnosis not present

## 2021-03-07 DIAGNOSIS — Z992 Dependence on renal dialysis: Secondary | ICD-10-CM | POA: Diagnosis not present

## 2021-03-07 DIAGNOSIS — G9341 Metabolic encephalopathy: Secondary | ICD-10-CM | POA: Diagnosis not present

## 2021-03-07 LAB — BASIC METABOLIC PANEL
Anion gap: 14 (ref 5–15)
BUN: 31 mg/dL — ABNORMAL HIGH (ref 8–23)
CO2: 25 mmol/L (ref 22–32)
Calcium: 8.9 mg/dL (ref 8.9–10.3)
Chloride: 92 mmol/L — ABNORMAL LOW (ref 98–111)
Creatinine, Ser: 5.48 mg/dL — ABNORMAL HIGH (ref 0.44–1.00)
GFR, Estimated: 7 mL/min — ABNORMAL LOW (ref >60)
Glucose, Bld: 93 mg/dL (ref 70–99)
Potassium: 4.2 mmol/L (ref 3.5–5.1)
Sodium: 131 mmol/L — ABNORMAL LOW (ref 135–145)

## 2021-03-07 LAB — GLUCOSE, CAPILLARY
Glucose-Capillary: 127 mg/dL — ABNORMAL HIGH (ref 70–99)
Glucose-Capillary: 101 mg/dL — ABNORMAL HIGH (ref 70–99)

## 2021-03-07 LAB — CBC
HCT: 24.3 % — ABNORMAL LOW (ref 36.0–46.0)
Hemoglobin: 7.2 g/dL — ABNORMAL LOW (ref 12.0–15.0)
MCH: 33.5 pg (ref 26.0–34.0)
MCHC: 29.6 g/dL — ABNORMAL LOW (ref 30.0–36.0)
MCV: 113 fL — ABNORMAL HIGH (ref 80.0–100.0)
Platelets: 124 10*3/uL — ABNORMAL LOW (ref 150–400)
RBC: 2.15 MIL/uL — ABNORMAL LOW (ref 3.87–5.11)
RDW: 16.9 % — ABNORMAL HIGH (ref 11.5–15.5)
WBC: 4.4 10*3/uL (ref 4.0–10.5)
nRBC: 0 % (ref 0.0–0.2)

## 2021-03-07 LAB — HEMOGLOBIN AND HEMATOCRIT, BLOOD
HCT: 27.3 % — ABNORMAL LOW (ref 36.0–46.0)
Hemoglobin: 8.1 g/dL — ABNORMAL LOW (ref 12.0–15.0)

## 2021-03-07 LAB — PHOSPHORUS: Phosphorus: 6.1 mg/dL — ABNORMAL HIGH (ref 2.5–4.6)

## 2021-03-07 LAB — MAGNESIUM: Magnesium: 2.2 mg/dL (ref 1.7–2.4)

## 2021-03-07 LAB — RESP PANEL BY RT-PCR (FLU A&B, COVID) ARPGX2
Influenza A by PCR: NEGATIVE
Influenza B by PCR: NEGATIVE
SARS Coronavirus 2 by RT PCR: NEGATIVE

## 2021-03-07 LAB — HEMOGLOBIN A1C
Hgb A1c MFr Bld: 5.3 % (ref 4.8–5.6)
Mean Plasma Glucose: 105 mg/dL

## 2021-03-07 LAB — HEPATITIS B SURFACE ANTIBODY, QUANTITATIVE: Hep B S AB Quant (Post): 11.2 m[IU]/mL (ref >9.9)

## 2021-03-07 NOTE — Discharge Instructions (Signed)
Advised to follow-up with primary care physician in 1 week. Advised to continue hemodialysis as per schedule tomorrow. Advised to continue current medications.

## 2021-03-07 NOTE — TOC Transition Note (Signed)
Transition of Care [facility]) - CM/SW Discharge Note   Patient Details  [name] MRN: [id number] Date of Birth: 01-Aug-1940  Transition of Care [facility]) CM/SW Contact:  Iona Beard, [place] Phone Number: 03/07/2021, 2:38 PM   Clinical Narrative:    TOC updated that pt is ready for discharge to SNF today. CSW confirmed pts insurance Josem Kaufmann has been approved. CSW spoke to [place] in admissions at [facility] who states pt can come to facility today. Pt will go to room 154 and the number for report was provided to pts RN. RN states that pt will likely need EMS transport to the facility. CSW to complete Med Necessity and call for transport when RN is ready. CSW updated pts niece Pam of discharge to facility. TOC signing off.   Final next level of care: Skilled Nursing Facility Barriers to Discharge: Barriers Resolved   Patient Goals and CMS Choice Patient states their goals for this hospitalization and ongoing recovery are:: Go to SNF CMS Medicare.gov Compare Post Acute Care list provided to:: Patient Choice offered to / list presented to : Patient  Discharge Placement              Patient chooses bed at: Other - please specify in the comment section below: [facility]) Patient to be transferred to facility by: EMS Name of family member notified: Margaretmary Eddy (Niece) [phone] Patient and family notified of of transfer: 03/07/21  Discharge Plan and Services                                     Social Determinants of Health (SDOH) Interventions     Readmission Risk Interventions Readmission Risk Prevention Plan 12/30/2019 10/27/2019  Transportation Screening Complete Complete  PCP or Specialist Appt within 5-7 Days - Complete  Home Care Screening - Complete  Medication Review (RN CM) - Complete  HRI or Home Care Consult Complete -  Social Work Consult for [place] Planning/Counseling Complete -  Palliative Care Screening Not Complete -  Medication Review  Press photographer) Complete -  Some recent data might be hidden

## 2021-03-07 NOTE — Progress Notes (Signed)
Admit: 03/04/2021 LOS: 0  Jody [name] ESRD HD TTS RUE AVF here with FTT, weakness  Subjective:  No c/o this AM, finishing up breakfast Last HD 1/21, had sig IDH despite bolus albumin, ended up 0.9L positive No c/o this AM  01/22 0701 - 01/23 0700 In: 270 [P.O.:270] Out: -   Filed Weights   03/04/21 0931 03/05/21 2015  Weight: 63.5 kg 82.5 kg    Scheduled Meds:  apixaban  2.5 mg Oral BID   Chlorhexidine Gluconate Cloth  6 each Topical Q0600   diltiazem  120 mg Oral Daily   furosemide  40 mg Oral Daily   insulin aspart  0-5 Units Subcutaneous QHS   insulin aspart  0-9 Units Subcutaneous TID WC   mometasone-formoterol  2 puff Inhalation BID   sevelamer carbonate  800 mg Oral TID WC   Continuous Infusions:  sodium chloride     sodium chloride     PRN Meds:.sodium chloride, sodium chloride, acetaminophen **OR** acetaminophen, albuterol, lidocaine (PF), lidocaine-prilocaine, pentafluoroprop-tetrafluoroeth  Current Labs: reviewed   Physical Exam:  Blood pressure 126/69, pulse 83, temperature 98.3 F (36.8 C), temperature source Oral, resp. rate 20, height '5\' 7"'  (1.702 m), weight 82.5 kg, SpO2 96 %. NAD, conversant, cheerful RRR CTAB RUE AVF + B/T No sig LEE EOMI, anicteric NCAT  A ESRD on HD TTS [facility] RUE AVF FTT/Weakness: plan to DC to SNF, PT/OT ANemia, Hb 7s, give ESA with HD tomorrow if here CKD-BMD: P improved, cont binder DM2 HTN, diltiazem, sig IDH but has AFib, see if this persists  P HD tomorrow: 3h, 2K, 1L UF goal, 400/600 AVF, Aranesp 64mc, no heparin Medication Issues; Preferred narcotic agents for pain control are hydromorphone, fentanyl, and methadone. Morphine should not be used.  Baclofen should be avoided Avoid oral sodium phosphate and magnesium citrate based laxatives / bowel preps    RPearson GrippeMD 03/07/2021, 10:29 AM  Recent Labs  Lab 03/05/21 0411 03/06/21 0556 03/07/21 0425  NA 133* 134* 131*  K 4.2 3.7 4.2  CL 95* 95* 92*  CO2  '24 27 25  ' GLUCOSE 100* 106* 93  BUN 49* 28* 31*  CREATININE 7.64* 4.52* 5.48*  CALCIUM 8.7* 8.7* 8.9  PHOS 8.5* 4.9* 6.1*   Recent Labs  Lab 03/03/21 0608 03/04/21 1233 03/05/21 0411 03/05/21 0821 03/06/21 0556 03/06/21 1143 03/07/21 0425  WBC 7.8 6.5 5.1  --  4.6  --  4.4  NEUTROABS 6.5 5.3  --   --   --   --   --   HGB 9.3* 9.2* 7.5*   < > 7.1* 7.5* 7.2*  HCT 30.6* 29.9* 24.3*   < > 22.9* 24.8* 24.3*  MCV 112.9* 111.2* 110.5*  --  109.6*  --  113.0*  PLT 151 146* 120*  --  109*  --  124*   < > = values in this interval not displayed.

## 2021-03-07 NOTE — Evaluation (Signed)
Occupational Therapy Evaluation Patient Details [name] MRN: [id number] DOB: 1941-02-09 Today's Date: 03/07/2021   History of Present Illness Jody [name] is a 81 y/o female. Pt brought in by [facility] from home with c/o right lower leg pain. Pt unable to report when the pain started. Pt also c/o right shoulder pain. Pt fell 2 days ago and was evaluated at ED at that time. Pt missed her dialysis yesterday because she was at the hospital.   Clinical Impression   Pt in bed upon therapy arrival and requesting to sit up in chair for breakfast. Agreeable to participate in OT evaluation. Patient demonstrates BUE weakness with right UE extremely limited due to lack of shoulder mobility. Patient required increased assistance to transition from supine to sitting on EOB and transferring to recliner. Recommend SNF at discharge to increase her overall functional performance and focus on mentioned deficits. OT will follow patient acutely.       Recommendations for follow up therapy are one component of a multi-disciplinary discharge planning process, led by the attending physician.  Recommendations may be updated based on patient status, additional functional criteria and insurance authorization.   Follow Up Recommendations  Skilled nursing-short term rehab (<3 hours/day)    Assistance Recommended at Discharge Frequent or constant Supervision/Assistance  Patient can return home with the following A lot of help with walking and/or transfers;Assistance with cooking/housework;A lot of help with bathing/dressing/bathroom;Direct supervision/assist for medications management;Direct supervision/assist for financial management;Assist for transportation;Help with stairs or ramp for entrance    Functional Status Assessment  Patient has had a recent decline in their functional status and demonstrates the ability to make significant improvements in function in a reasonable and predictable amount of time.   Equipment Recommendations  None recommended by OT       Precautions / Restrictions Precautions Precautions: Fall Restrictions Weight Bearing Restrictions: No      Mobility Bed Mobility Overal bed mobility: Needs Assistance Bed Mobility: Supine to Sit     Supine to sit: Max assist          Transfers Overall transfer level: Needs assistance Equipment used: Rolling walker (2 wheels) Transfers: Sit to/from Stand, Bed to chair/wheelchair/BSC Sit to Stand: Max assist     Step pivot transfers: Min guard     General transfer comment: VC for technique. OT assisted with moving walker during transfer.      Balance Overall balance assessment: History of Falls Sitting-balance support: Feet supported, No upper extremity supported Sitting balance-Leahy Scale: Good     Standing balance support: Reliant on assistive device for balance, During functional activity, Bilateral upper extremity supported Standing balance-Leahy Scale: Poor Standing balance comment: using RW         ADL either performed or assessed with clinical judgement   ADL Overall ADL's : Needs assistance/impaired Eating/Feeding: Set up;Sitting   Grooming: Wash/dry hands;Wash/dry face;Set up;Sitting   Upper Body Bathing: Moderate assistance;Sitting   Lower Body Bathing: Total assistance;Sit to/from stand;Bed level   Upper Body Dressing : Minimal assistance;Sitting   Lower Body Dressing: Total assistance;Sit to/from stand   Toilet Transfer: Maximal assistance;Ambulation;Rolling walker (2 wheels) Toilet Transfer Details (indicate cue type and reason): to recliner. Completed lateral side steps to the right. Toileting- Clothing Manipulation and Hygiene: Total assistance;Sit to/from stand               Vision Baseline Vision/History: 0 No visual deficits Patient Visual Report: No change from baseline  Pertinent Vitals/Pain Pain Assessment Pain Assessment: Faces Faces Pain Scale:  Hurts even more Pain Location: left knee Pain Descriptors / Indicators: Discomfort Pain Intervention(s): Monitored during session     Hand Dominance Right   Extremity/Trunk Assessment Upper Extremity Assessment Upper Extremity Assessment: RUE deficits/detail;Generalized weakness RUE Deficits / Details: 3-/5. Limited use. No active movement demonstrated with shoulder, full A/ROM elbow flexion/extension. Right upper arm dialysis port. Able to hold onto RW during transfer and bear weight into both UE's equally.   Lower Extremity Assessment Lower Extremity Assessment: Defer to PT evaluation       Communication Communication Communication: No difficulties   Cognition Arousal/Alertness: Awake/alert Behavior During Therapy: WFL for tasks assessed/performed Overall Cognitive Status: Within Functional Limits for tasks assessed                    Home Living Family/patient expects to be discharged to:: Skilled nursing facility Living Arrangements: Alone Available Help at Discharge: Family;Available PRN/intermittently;Personal care attendant (Home health aid Tues and Thursday all day to assist with ADL tasks.) Type of Home: House Home Access: Level entry     Home Layout: One level     Bathroom Shower/Tub: Occupational psychologist: Handicapped height Bathroom Accessibility: Yes   Home Equipment: Conservation officer, nature (2 wheels);Cane - single point;BSC/3in1;Shower seat - built in;Rollator (4 wheels);Grab bars - tub/shower;Grab bars - toilet          Prior Functioning/Environment Prior Level of Function : Needs assist       Physical Assist : Mobility (physical);ADLs (physical) Mobility (physical): Bed mobility;Transfers;Gait;Stairs   Mobility Comments: Household ambulator using RW          OT Problem List: Impaired balance (sitting and/or standing);Impaired UE functional use;Decreased safety awareness;Decreased activity tolerance;Pain;Decreased strength       OT Treatment/Interventions: Self-care/ADL training;Therapeutic exercise;Therapeutic activities;Neuromuscular education;DME and/or AE instruction;Patient/family education;Balance training;Manual therapy;Modalities    OT Goals(Current goals can be found in the care plan section) Acute Rehab OT Goals Patient Stated Goal: to go home OT Goal Formulation: With patient Time For Goal Achievement: 03/21/21 Potential to Achieve Goals: Good  OT Frequency: Min 2X/week       AM-PAC OT "6 Clicks" Daily Activity     Outcome Measure Help from another person eating meals?: A Little Help from another person taking care of personal grooming?: A Little Help from another person toileting, which includes using toliet, bedpan, or urinal?: A Lot Help from another person bathing (including washing, rinsing, drying)?: A Lot Help from another person to put on and taking off regular upper body clothing?: A Little Help from another person to put on and taking off regular lower body clothing?: Total 6 Click Score: 14   End of Session Equipment Utilized During Treatment: Gait belt;Rolling walker (2 wheels);Oxygen  Activity Tolerance: Patient tolerated treatment well Patient left: in chair;with call bell/phone within reach;with chair alarm set  OT Visit Diagnosis: Muscle weakness (generalized) (M62.81);History of falling (Z91.81)                Time: 3335-4562 OT Time Calculation (min): 28 min Charges:  OT General Charges $OT Visit: 1 Visit OT Evaluation $OT Eval Low Complexity: [place], OTR/L,CBIS  [phone]   Dixon Luczak, Clarene Duke 03/07/2021, 9:18 AM

## 2021-03-07 NOTE — Discharge Summary (Addendum)
Physician Discharge Summary  Jody [name] WYO:[id number] DOB: 03-05-1940 DOA: 03/04/2021  PCP: Monico Blitz, MD  Admit date: 03/04/2021  Discharge date: 03/07/2021  Admitted From: Home.  Disposition: SNF  Recommendations for Outpatient Follow-up:  Follow up with PCP in 1-2 weeks. Please obtain BMP/CBC in one week. Advised to continue hemodialysis as per schedule tomorrow. Advised to continue current medications.  Home Health: None Equipment/Devices:Home oxygen  Discharge Condition: Stable CODE STATUS:Full code Diet recommendation: Renal diet  Brief Summary/ Hospital Course: This 81 years old female with PMH significant for ESRD on hemodialysis TTS, diabetes mellitus, atrial fibrillation, COPD, CKD stage IV, diastolic CHF presented in the ED accompanied by her home health aide for the evaluation of pain in the right lower extremity and weakness upon standing. She is s/p fall 2 days ago at home.  Patient was evaluated in the ED and she was discharged home with her nephew.  Patient lives alone and unable to care for herself, she was unable to get up from her chair due to weakness in the legs.  She uses walker at baseline. She has missed her hemodialysis on Thursday.  In the ED her creatinine was found to be 6.5. CTA chest with no evidence of PE, CT head without acute findings. Patient was admitted for missed hemodialysis and for placement..  Nephrology consulted, underwent HD.  Patient seems much improved.  Nephrology recommended she can continue hemodialysis as per schedule.  PT recommended a skilled nursing facility.  Patient feels better want to be discharged.  Patient is being discharged to skilled facility for rehab.  She was managed for below problems.   Discharge Diagnoses:  Principal Problem:   Fall Active Problems:   Essential hypertension   Gait difficulty   COPD (chronic obstructive pulmonary disease) (HCC)   Anemia in chronic kidney disease, Stage IV   Chronic diastolic  CHF (congestive heart failure) (HCC)   Type 2 diabetes mellitus with nephropathy (HCC)   ESRD on dialysis ([place])   Pressure injury of skin  Multiple falls: Patient is very deconditioned, weak with unsteady gait. PT/OT were consulted, recommended SNF placement. TOC consulted, continue PT and OT until discharge on Monday. MRI brain ruled out any acute stroke   ESRD: She has missed her hemodialysis  1/20 ED physician has spoken with nephrology, completed hemodialysis on 1/21. Continue hemodialysis as per schedule.  Next hemodialysis 1/24.   Chronic atrial fibrillation: Continue diltiazem and metoprolol for heart rate control. Continue Eliquis.   Chronic diastolic CHF: There is no evidence of volume overload. Volume management with dialysis. Continue home dose of Lasix.   Diabetes mellitus: Continue regular insulin sliding scale. She does not appear to be on any home diabetic medications.   Essential hypertension: Continue Cardizem and metoprolol   COPD: Continue home inhalers and Advair.   History of CVA: Continue Eliquis.  Discharge Instructions  Discharge Instructions     Call MD for:  difficulty breathing, headache or visual disturbances   Complete by: As directed    Call MD for:  persistant dizziness or light-headedness   Complete by: As directed    Call MD for:  persistant nausea and vomiting   Complete by: As directed    Diet - low sodium heart healthy   Complete by: As directed    Diet Carb Modified   Complete by: As directed    Discharge instructions   Complete by: As directed    Advised to follow-up with primary care physician in 1 week. Advised  to continue hemodialysis as per schedule tomorrow. Advised to continue current medications.   Discharge wound care:   Complete by: As directed    Continue wound care at nursing home.   Increase activity slowly   Complete by: As directed       Allergies as of 03/07/2021       Reactions   Codeine Nausea  And Vomiting        Medication List     STOP taking these medications    magnesium hydroxide 400 MG/5ML suspension Commonly known as: MILK OF MAGNESIA   pantoprazole 40 MG tablet Commonly known as: Protonix   sodium bicarbonate 650 MG tablet   Tresiba FlexTouch 100 UNIT/ML FlexTouch Pen Generic drug: insulin degludec       TAKE these medications    albuterol 108 (90 Base) MCG/ACT inhaler Commonly known as: VENTOLIN HFA Inhale 1-2 puffs into the lungs every 6 (six) hours as needed for wheezing or shortness of breath. Reported on 03/01/2015   albuterol 0.63 MG/3ML nebulizer solution Commonly known as: ACCUNEB Take 1 ampule by nebulization every 6 (six) hours as needed for wheezing.   apixaban 2.5 MG Tabs tablet Commonly known as: ELIQUIS Take by mouth 2 (two) times daily.   diltiazem 120 MG 24 hr capsule Commonly known as: CARDIZEM CD Take 120 mg by mouth daily.   fluticasone-salmeterol 230-21 MCG/ACT inhaler Commonly known as: ADVAIR HFA Inhale 2 puffs into the lungs 2 (two) times daily.   furosemide 40 MG tablet Commonly known as: LASIX Take 40 mg by mouth.   gabapentin 100 MG capsule Commonly known as: NEURONTIN Take 100 mg by mouth 3 (three) times daily. What changed: Another medication with the same name was removed. Continue taking this medication, and follow the directions you see here.   metoprolol tartrate 25 MG tablet Commonly known as: LOPRESSOR Take 0.5 tablets (12.5 mg total) by mouth 2 (two) times daily.   pramipexole 0.125 MG tablet Commonly known as: MIRAPEX Take 0.125 mg by mouth daily.   sevelamer carbonate 800 MG tablet Commonly known as: RENVELA Take 800 mg by mouth 3 (three) times daily with meals.               Discharge Care Instructions  (From admission, onward)           Start     Ordered   03/07/21 0000  Discharge wound care:       Comments: Continue wound care at nursing home.   03/07/21 1242             Follow-up Information     Monico Blitz, MD Follow up in 1 week(s).   Specialty: Internal Medicine Contact information: [place] [address] [phone]         Minus Breeding, MD .   Specialty: Cardiology Contact information: [place] [address] [phone]                Allergies  Allergen Reactions   Codeine Nausea And Vomiting    Consultations: Nephrology   Procedures/Studies: DG Chest 1 View  Result Date: 03/04/2021 CLINICAL DATA:  Weakness, fall EXAM: CHEST  1 VIEW COMPARISON:  Chest radiograph dated March 03, 2020 FINDINGS: The heart is enlarged. Atherosclerotic calcification of aortic arch. Mild pulmonary vascular congestion. Low lung volumes without focal consolidation or large pleural effusion. No acute osseous abnormality. IMPRESSION: Stable cardiomegaly with mild pulmonary vascular congestion. Sign about low lung volumes without focal  consolidation or pleural effusion. Electronically Signed   By: Keane Police D.O.   On: 03/04/2021 14:26   DG Chest 2 View  Result Date: 03/03/2021 CLINICAL DATA:  Fall.  Shortness of breath. EXAM: CHEST - 2 VIEW COMPARISON:  10/27/2020. FINDINGS: Stable cardiac enlargement. Aortic atherosclerotic calcifications. There is persistent and progressive increase perihilar opacification in both upper lobes concerning for underlying adenopathy. There is diffuse pulmonary vascular congestion. Atelectasis is noted in the left lung base. IMPRESSION: 1. Progressive bilateral upper lobe perihilar opacification concerning for underlying adenopathy. Recommend further evaluation with contrast enhanced CT of the chest. 2. Cardiac enlargement with pulmonary vascular congestion. Electronically Signed   By: Kerby Moors M.D.   On: 03/03/2021 05:56   DG Thoracic Spine 2 View  Result Date: 03/03/2021 CLINICAL DATA:  81 year old female status post fall with pain. EXAM: THORACIC SPINE 2 VIEWS COMPARISON:  CTA  chest 06/06/2020. FINDINGS: Thoracic segmentation appears normal. Bone mineralization is within normal limits for age. Flowing thoracic endplate osteophytes resulting in occasional interbody ankylosis as demonstrated on the CTA last year. Stable vertebral height and alignment. Relatively preserved disc spaces. Cervicothoracic junction alignment is within normal limits. No acute osseous abnormality identified. Calcified aortic atherosclerosis. Stable visible thoracic visceral contours. IMPRESSION: No acute osseous abnormality identified in the thoracic spine. Diffuse idiopathic skeletal hyperostosis (DISH). Electronically Signed   By: Genevie Ann M.D.   On: 03/03/2021 05:55   DG Lumbar Spine Complete  Result Date: 03/03/2021 CLINICAL DATA:  81 year old female status post fall with pain. EXAM: LUMBAR SPINE - COMPLETE 4+ VIEW COMPARISON:  Thoracic radiographs today. Lumbar radiographs 01/29/2021. CT Abdomen and Pelvis 08/09/2018. FINDINGS: Transitional lumbosacral anatomy with lumbarized S1 level. Chronic dextroconvex lumbar scoliosis. Widespread chronic disc and endplate degeneration with extensive lumbar vacuum disc. Chronic interbody ankylosis L2-L3. And possible L4-L5 interbody ankylosis, new since 2020. Grossly intact visible sacrum. No acute osseous abnormality identified. Extensive Aortoiliac calcified atherosclerosis. IMPRESSION: 1. No acute osseous abnormality identified in the lumbar spine. 2. Transitional lumbosacral anatomy with lumbarized S1 level. Chronic dextroconvex lumbar scoliosis and widespread severe disc and endplate degeneration. Chronic ankylosis at L2-L3, and possible progressive L4-L5 ankylosis since 2020. 3.  Aortic Atherosclerosis (ICD10-I70.0). Electronically Signed   By: Genevie Ann M.D.   On: 03/03/2021 05:58   DG Shoulder Right  Result Date: 03/04/2021 CLINICAL DATA:  Right shoulder pain after fall yesterday. EXAM: RIGHT SHOULDER - 2+ VIEW COMPARISON:  Right shoulder x-rays dated February 01, 2021. FINDINGS: No acute fracture or dislocation. Unchanged mild glenohumeral and moderate acromioclavicular joint osteoarthritis. Osteopenia. Soft tissues are unremarkable. IMPRESSION: 1. No acute osseous abnormality. Electronically Signed   By: Titus Dubin M.D.   On: 03/04/2021 13:07   CT HEAD WO CONTRAST (5MM)  Result Date: 03/03/2021 CLINICAL DATA:  Head trauma.  Status post fall. EXAM: CT HEAD WITHOUT CONTRAST CT CERVICAL SPINE WITHOUT CONTRAST TECHNIQUE: Multidetector CT imaging of the head and cervical spine was performed following the standard protocol without intravenous contrast. Multiplanar CT image reconstructions of the cervical spine were also generated. RADIATION DOSE REDUCTION: This exam was performed according to the departmental dose-optimization program which includes automated exposure control, adjustment of the mA and/or kV according to patient size and/or use of iterative reconstruction technique. COMPARISON:  02/11/2021 FINDINGS: CT HEAD FINDINGS Brain: No evidence of acute infarction, hemorrhage, hydrocephalus, extra-axial collection or mass lesion/mass effect. There is moderate patchy and confluent areas of low-attenuation within the subcortical and periventricular white matter compatible with chronic microvascular disease.  Encephalomalacia within the right parieto-occipital lobe appears unchanged from 02/01/2021. Vascular: No hyperdense vessel or unexpected calcification. Skull: Normal. Negative for fracture or focal lesion. Sinuses/Orbits: No acute finding. Other: None CT CERVICAL SPINE FINDINGS Alignment: Normal. Skull base and vertebrae: No acute fracture. No primary bone lesion or focal pathologic process. Soft tissues and spinal canal: No prevertebral fluid or swelling. No visible canal hematoma. Disc levels: There is ankylosis of the C3 through C5 vertebra. Marked degenerative disc disease is identified at C5-6 and C6-7. Mild degenerative disc disease noted at C2-3. Upper  chest: Negative. Other: None IMPRESSION: 1. No acute intracranial abnormality. 2. Chronic microvascular disease and right parieto-occipital lobe encephalomalacia. 3. No evidence for cervical spine fracture or subluxation. 4. Advanced cervical degenerative disc disease as above. Electronically Signed   By: Kerby Moors M.D.   On: 03/03/2021 05:45   CT Angio Chest Pulmonary Embolism (PE) W or WO Contrast  Result Date: 03/03/2021 CLINICAL DATA:  PE suspected, fall EXAM: CT ANGIOGRAPHY CHEST WITH CONTRAST TECHNIQUE: Multidetector CT imaging of the chest was performed using the standard protocol during bolus administration of intravenous contrast. Multiplanar CT image reconstructions and MIPs were obtained to evaluate the vascular anatomy. RADIATION DOSE REDUCTION: This exam was performed according to the departmental dose-optimization program which includes automated exposure control, adjustment of the mA and/or kV according to patient size and/or use of iterative reconstruction technique. CONTRAST:  77mL OMNIPAQUE IOHEXOL 350 MG/ML SOLN COMPARISON:  06/06/2020 FINDINGS: Cardiovascular: Satisfactory opacification of the pulmonary arteries to the segmental level. No evidence of pulmonary embolism. Cardiomegaly. Three-vessel coronary artery calcifications. Enlargement of the main pulmonary artery, measuring up to 3.8 cm in caliber. No pericardial effusion. Aortic atherosclerosis. Mediastinum/Nodes: No enlarged mediastinal, hilar, or axillary lymph nodes. Thyroid gland, trachea, and esophagus demonstrate no significant findings. Lungs/Pleura: Unchanged scarring and or atelectasis of the bilateral lung bases. No pleural effusion or pneumothorax. Upper Abdomen: No acute abnormality. Musculoskeletal: No chest wall abnormality. No acute osseous findings. Review of the MIP images confirms the above findings. IMPRESSION: 1. Negative examination for pulmonary embolism. 2. No acute airspace opacity. 3. Enlargement of the  main pulmonary artery, as can be seen in pulmonary hypertension. 4. Cardiomegaly and coronary artery disease. Aortic Atherosclerosis (ICD10-I70.0). Electronically Signed   By: Delanna Ahmadi M.D.   On: 03/03/2021 09:04   CT CERVICAL SPINE WO CONTRAST  Result Date: 03/03/2021 CLINICAL DATA:  Head trauma.  Status post fall. EXAM: CT HEAD WITHOUT CONTRAST CT CERVICAL SPINE WITHOUT CONTRAST TECHNIQUE: Multidetector CT imaging of the head and cervical spine was performed following the standard protocol without intravenous contrast. Multiplanar CT image reconstructions of the cervical spine were also generated. RADIATION DOSE REDUCTION: This exam was performed according to the departmental dose-optimization program which includes automated exposure control, adjustment of the mA and/or kV according to patient size and/or use of iterative reconstruction technique. COMPARISON:  02/11/2021 FINDINGS: CT HEAD FINDINGS Brain: No evidence of acute infarction, hemorrhage, hydrocephalus, extra-axial collection or mass lesion/mass effect. There is moderate patchy and confluent areas of low-attenuation within the subcortical and periventricular white matter compatible with chronic microvascular disease. Encephalomalacia within the right parieto-occipital lobe appears unchanged from 02/01/2021. Vascular: No hyperdense vessel or unexpected calcification. Skull: Normal. Negative for fracture or focal lesion. Sinuses/Orbits: No acute finding. Other: None CT CERVICAL SPINE FINDINGS Alignment: Normal. Skull base and vertebrae: No acute fracture. No primary bone lesion or focal pathologic process. Soft tissues and spinal canal: No prevertebral fluid or swelling. No visible canal hematoma.  Disc levels: There is ankylosis of the C3 through C5 vertebra. Marked degenerative disc disease is identified at C5-6 and C6-7. Mild degenerative disc disease noted at C2-3. Upper chest: Negative. Other: None IMPRESSION: 1. No acute intracranial  abnormality. 2. Chronic microvascular disease and right parieto-occipital lobe encephalomalacia. 3. No evidence for cervical spine fracture or subluxation. 4. Advanced cervical degenerative disc disease as above. Electronically Signed   By: Kerby Moors M.D.   On: 03/03/2021 05:45   MR BRAIN WO CONTRAST  Result Date: 03/07/2021 CLINICAL DATA:  Neuro deficit, acute, stroke suspected. Right upper extremity weakness and recurrent falls. EXAM: MRI HEAD WITHOUT CONTRAST TECHNIQUE: Multiplanar, multiecho pulse sequences of the brain and surrounding structures were obtained without intravenous contrast. COMPARISON:  Head CT 03/03/2021 and MRI 05/27/2014 FINDINGS: Some sequences are moderately motion degraded despite repeat imaging. Brain: There is no evidence of an acute infarct, mass, midline shift, or extra-axial fluid collection. A chronic microhemorrhage is noted in the right parietal lobe. There are small chronic infarcts in the frontal lobes, right parietal lobe, and right occipital lobe. There are also chronic lacunar infarcts in the bilateral basal ganglia and left thalamus. There is mild cerebral atrophy. Patchy to confluent T2 hyperintensities in the cerebral white matter bilaterally have slightly progressed from the prior MRI and are nonspecific but compatible with moderate to severe chronic small vessel ischemic disease. Vascular: Major intracranial vascular flow voids are preserved. Skull and upper cervical spine: Unremarkable bone marrow signal para Sinuses/Orbits: Bilateral cataract extraction. Trace left mastoid fluid. Clear paranasal sinuses. Other: None. IMPRESSION: 1. No evidence of an acute intracranial abnormality on this motion degraded study. 2. Moderate to severe chronic small vessel ischemic disease with chronic infarcts as above. Electronically Signed   By: Logan Bores M.D.   On: 03/07/2021 10:55   DG Hip Unilat W or Wo Pelvis 2-3 Views Right  Result Date: 03/04/2021 CLINICAL DATA:   Right hip pain after fall yesterday. EXAM: DG HIP (WITH OR WITHOUT PELVIS) 2-3V RIGHT COMPARISON:  Right hip x-rays dated February 01, 2021. FINDINGS: No acute fracture or dislocation. Osteopenia. Severe lumbar degenerative disc disease. Soft tissues are unremarkable. IMPRESSION: 1. No acute osseous abnormality. Electronically Signed   By: Titus Dubin M.D.   On: 03/04/2021 13:09    MRI brain.   Subjective: Patient was seen and examined at bedside.  Overnight events noted.   Patient reports feeling much improved.  Patient is being discharged to skilled nursing facility for rehab.  Discharge Exam: Vitals:   03/07/21 1100 03/07/21 1118  BP: (!) 128/91   Pulse: 75   Resp: (!) 24   Temp:    SpO2: 91% 94%   Vitals:   03/07/21 0528 03/07/21 0716 03/07/21 1100 03/07/21 1118  BP:   (!) 128/91   Pulse:   75   Resp:   (!) 24   Temp:      TempSrc:      SpO2: 95% 96% 91% 94%  Weight:      Height:        General: Pt is alert, awake, not in acute distress Cardiovascular: RRR, S1/S2 +, no rubs, no gallops Respiratory: CTA bilaterally, no wheezing, no rhonchi Abdominal: Soft, NT, ND, bowel sounds + Extremities: no edema, no cyanosis    The results of significant diagnostics from this hospitalization (including imaging, microbiology, ancillary and laboratory) are listed below for reference.     Microbiology: Recent Results (from the past 240 hour(s))  Resp Panel by RT-PCR (  Flu A&B, Covid) Nasopharyngeal Swab     Status: None   Collection Time: 03/04/21  5:09 PM   Specimen: Nasopharyngeal Swab; Nasopharyngeal(NP) swabs in vial transport medium  Result Value Ref Range Status   SARS Coronavirus 2 by RT PCR NEGATIVE NEGATIVE Final    Comment: (NOTE) SARS-CoV-2 target nucleic acids are NOT DETECTED.  The SARS-CoV-2 RNA is generally detectable in upper respiratory specimens during the acute phase of infection. The lowest concentration of SARS-CoV-2 viral copies this assay can detect  is 138 copies/mL. A negative result does not preclude SARS-Cov-2 infection and should not be used as the sole basis for treatment or other patient management decisions. A negative result may occur with  improper specimen collection/handling, submission of specimen other than nasopharyngeal swab, presence of viral mutation(s) within the areas targeted by this assay, and inadequate number of viral copies(<138 copies/mL). A negative result must be combined with clinical observations, patient history, and epidemiological information. The expected result is Negative.  Fact Sheet for Patients:  EntrepreneurPulse.com.au  Fact Sheet for Healthcare Providers:  IncredibleEmployment.be  This test is no t yet approved or cleared by the [place] FDA and  has been authorized for detection and/or diagnosis of SARS-CoV-2 by FDA under an Emergency Use Authorization (EUA). This EUA will remain  in effect (meaning this test can be used) for the duration of the COVID-19 declaration under Section 564(b)(1) of the Act, 21 U.S.C.section 360bbb-3(b)(1), unless the authorization is terminated  or revoked sooner.       Influenza A by PCR NEGATIVE NEGATIVE Final   Influenza B by PCR NEGATIVE NEGATIVE Final    Comment: (NOTE) The Xpert Xpress SARS-CoV-2/FLU/RSV plus assay is intended as an aid in the diagnosis of influenza from Nasopharyngeal swab specimens and should not be used as a sole basis for treatment. Nasal washings and aspirates are unacceptable for Xpert Xpress SARS-CoV-2/FLU/RSV testing.  Fact Sheet for Patients: EntrepreneurPulse.com.au  Fact Sheet for Healthcare Providers: IncredibleEmployment.be  This test is not yet approved or cleared by the [place] FDA and has been authorized for detection and/or diagnosis of SARS-CoV-2 by FDA under an Emergency Use Authorization (EUA). This EUA will remain in effect  (meaning this test can be used) for the duration of the COVID-19 declaration under Section 564(b)(1) of the Act, 21 U.S.C. section 360bbb-3(b)(1), unless the authorization is terminated or revoked.  Performed at [facility], [address], [place], [place] [address]      Labs: BNP (last 3 results) Recent Labs    06/06/20 2110 10/27/20 2111 03/03/21 0608  BNP 1,967.0* 1,496.0* 8,099.8*   Basic Metabolic Panel: Recent Labs  Lab 03/03/21 0608 03/04/21 1233 03/05/21 0411 03/06/21 0556 03/07/21 0425  NA 136 133* 133* 134* 131*  K 3.5 3.8 4.2 3.7 4.2  CL 94* 93* 95* 95* 92*  CO2 26 24 24 27 25   GLUCOSE 105* 124* 100* 106* 93  BUN 29* 41* 49* 28* 31*  CREATININE 5.34* 6.53* 7.64* 4.52* 5.48*  CALCIUM 9.6 9.2 8.7* 8.7* 8.9  MG  --   --   --  2.1 2.2  PHOS  --   --  8.5* 4.9* 6.1*   Liver Function Tests: Recent Labs  Lab 03/05/21 0411  ALBUMIN 2.8*   No results for input(s): LIPASE, AMYLASE in the last 168 hours. No results for input(s): AMMONIA in the last 168 hours. CBC: Recent Labs  Lab 03/03/21 3382 03/04/21 1233 03/05/21 0411 03/05/21 5053 03/06/21 0556 03/06/21 1143 03/07/21 0425  03/07/21 1200  WBC 7.8 6.5 5.1  --  4.6  --  4.4  --   NEUTROABS 6.5 5.3  --   --   --   --   --   --   HGB 9.3* 9.2* 7.5* 7.7* 7.1* 7.5* 7.2* 8.1*  HCT 30.6* 29.9* 24.3* 24.9* 22.9* 24.8* 24.3* 27.3*  MCV 112.9* 111.2* 110.5*  --  109.6*  --  113.0*  --   PLT 151 146* 120*  --  109*  --  124*  --    Cardiac Enzymes: No results for input(s): CKTOTAL, CKMB, CKMBINDEX, TROPONINI in the last 168 hours. BNP: Invalid input(s): POCBNP CBG: Recent Labs  Lab 03/06/21 1111 03/06/21 1607 03/06/21 2143 03/07/21 0725 03/07/21 1204  GLUCAP 194* 163* 161* 127* 101*   D-Dimer No results for input(s): DDIMER in the last 72 hours. Hgb A1c No results for input(s): HGBA1C in the last 72 hours. Lipid Profile No results for input(s): CHOL, HDL, LDLCALC, TRIG, CHOLHDL, LDLDIRECT in  the last 72 hours. Thyroid function studies No results for input(s): TSH, T4TOTAL, T3FREE, THYROIDAB in the last 72 hours.  Invalid input(s): FREET3 Anemia work up No results for input(s): VITAMINB12, FOLATE, FERRITIN, TIBC, IRON, RETICCTPCT in the last 72 hours. Urinalysis    Component Value Date/Time   COLORURINE YELLOW 05/27/2014 1902   APPEARANCEUR CLEAR 05/27/2014 1902   LABSPEC 1.012 05/27/2014 1902   PHURINE 5.0 05/27/2014 1902   GLUCOSEU NEGATIVE 05/27/2014 1902   HGBUR NEGATIVE 05/27/2014 1902   BILIRUBINUR NEGATIVE 05/27/2014 1902   KETONESUR NEGATIVE 05/27/2014 1902   PROTEINUR 100 (A) 05/27/2014 1902   UROBILINOGEN 0.2 05/27/2014 1902   NITRITE NEGATIVE 05/27/2014 1902   LEUKOCYTESUR NEGATIVE 05/27/2014 1902   Sepsis Labs Invalid input(s): PROCALCITONIN,  WBC,  LACTICIDVEN Microbiology Recent Results (from the past 240 hour(s))  Resp Panel by RT-PCR (Flu A&B, Covid) Nasopharyngeal Swab     Status: None   Collection Time: 03/04/21  5:09 PM   Specimen: Nasopharyngeal Swab; Nasopharyngeal(NP) swabs in vial transport medium  Result Value Ref Range Status   SARS Coronavirus 2 by RT PCR NEGATIVE NEGATIVE Final    Comment: (NOTE) SARS-CoV-2 target nucleic acids are NOT DETECTED.  The SARS-CoV-2 RNA is generally detectable in upper respiratory specimens during the acute phase of infection. The lowest concentration of SARS-CoV-2 viral copies this assay can detect is 138 copies/mL. A negative result does not preclude SARS-Cov-2 infection and should not be used as the sole basis for treatment or other patient management decisions. A negative result may occur with  improper specimen collection/handling, submission of specimen other than nasopharyngeal swab, presence of viral mutation(s) within the areas targeted by this assay, and inadequate number of viral copies(<138 copies/mL). A negative result must be combined with clinical observations, patient history, and  epidemiological information. The expected result is Negative.  Fact Sheet for Patients:  EntrepreneurPulse.com.au  Fact Sheet for Healthcare Providers:  IncredibleEmployment.be  This test is no t yet approved or cleared by the [place] FDA and  has been authorized for detection and/or diagnosis of SARS-CoV-2 by FDA under an Emergency Use Authorization (EUA). This EUA will remain  in effect (meaning this test can be used) for the duration of the COVID-19 declaration under Section 564(b)(1) of the Act, 21 U.S.C.section 360bbb-3(b)(1), unless the authorization is terminated  or revoked sooner.       Influenza A by PCR NEGATIVE NEGATIVE Final   Influenza B by PCR NEGATIVE NEGATIVE Final  Comment: (NOTE) The Xpert Xpress SARS-CoV-2/FLU/RSV plus assay is intended as an aid in the diagnosis of influenza from Nasopharyngeal swab specimens and should not be used as a sole basis for treatment. Nasal washings and aspirates are unacceptable for Xpert Xpress SARS-CoV-2/FLU/RSV testing.  Fact Sheet for Patients: EntrepreneurPulse.com.au  Fact Sheet for Healthcare Providers: IncredibleEmployment.be  This test is not yet approved or cleared by the [place] FDA and has been authorized for detection and/or diagnosis of SARS-CoV-2 by FDA under an Emergency Use Authorization (EUA). This EUA will remain in effect (meaning this test can be used) for the duration of the COVID-19 declaration under Section 564(b)(1) of the Act, 21 U.S.C. section 360bbb-3(b)(1), unless the authorization is terminated or revoked.  Performed at [facility], [address], [place], [place] [address]      Time coordinating discharge: Over 30 minutes  SIGNED:   Shawna Clamp, MD  [facility] 03/07/2021, 12:58 PM Pager   If 7PM-7AM, please contact night-coverage

## 2021-03-07 NOTE — Progress Notes (Signed)
Pt has been discharged to SNF. Left unit on stretcher pushed by ambulance staff. Left in stable condition. Report called and given to [facility], RN at facility. All of nurse's questions answered to her satisfaction. Pt's niece, Jeannene Patella called and made aware of patient's transfer. Pam expressed thanks for the call.

## 2021-03-07 NOTE — Plan of Care (Signed)
°  Problem: Acute Rehab OT Goals (only OT should resolve) Goal: Pt. Will Perform Upper Body Bathing Flowsheets (Taken 03/07/2021 0920) Pt Will Perform Upper Body Bathing:  with min assist  sitting Goal: Pt. Will Perform Lower Body Bathing Flowsheets (Taken 03/07/2021 0920) Pt Will Perform Lower Body Bathing:  with mod assist  sitting/lateral leans  sit to/from stand  bed level Goal: Pt. Will Perform Upper Body Dressing Flowsheets (Taken 03/07/2021 0920) Pt Will Perform Upper Body Dressing:  with min assist  sitting Goal: Pt. Will Perform Lower Body Dressing Flowsheets (Taken 03/07/2021 0920) Pt Will Perform Lower Body Dressing:  with mod assist  sitting/lateral leans  sit to/from stand  bed level Goal: Pt. Will Transfer To Toilet Flowsheets (Taken 03/07/2021 0920) Pt Will Transfer to Toilet:  with min assist  ambulating  bedside commode Goal: Pt. Will Perform Toileting-Clothing Manipulation Flowsheets (Taken 03/07/2021 0920) Pt Will Perform Toileting - Clothing Manipulation and hygiene:  with mod assist  sit to/from stand  sitting/lateral leans Goal: Pt/Caregiver Will Perform Home Exercise Program Flowsheets (Taken 03/07/2021 0920) Pt/caregiver will Perform Home Exercise Program:  Increased strength  Both right and left upper extremity  With Supervision  With written HEP provided

## 2021-03-08 DIAGNOSIS — Z992 Dependence on renal dialysis: Secondary | ICD-10-CM | POA: Diagnosis not present

## 2021-03-08 DIAGNOSIS — J9611 Chronic respiratory failure with hypoxia: Secondary | ICD-10-CM | POA: Diagnosis not present

## 2021-03-08 DIAGNOSIS — N186 End stage renal disease: Secondary | ICD-10-CM | POA: Diagnosis not present

## 2021-03-08 DIAGNOSIS — I10 Essential (primary) hypertension: Secondary | ICD-10-CM | POA: Diagnosis not present

## 2021-03-08 DIAGNOSIS — Z299 Encounter for prophylactic measures, unspecified: Secondary | ICD-10-CM | POA: Diagnosis not present

## 2021-03-08 DIAGNOSIS — R531 Weakness: Secondary | ICD-10-CM | POA: Diagnosis not present

## 2021-03-08 DIAGNOSIS — Z87891 Personal history of nicotine dependence: Secondary | ICD-10-CM | POA: Diagnosis not present

## 2021-03-10 DIAGNOSIS — N186 End stage renal disease: Secondary | ICD-10-CM | POA: Diagnosis not present

## 2021-03-10 DIAGNOSIS — Z992 Dependence on renal dialysis: Secondary | ICD-10-CM | POA: Diagnosis not present

## 2021-03-12 DIAGNOSIS — Z992 Dependence on renal dialysis: Secondary | ICD-10-CM | POA: Diagnosis not present

## 2021-03-12 DIAGNOSIS — N186 End stage renal disease: Secondary | ICD-10-CM | POA: Diagnosis not present

## 2021-03-15 DIAGNOSIS — N186 End stage renal disease: Secondary | ICD-10-CM | POA: Diagnosis not present

## 2021-03-15 DIAGNOSIS — Z992 Dependence on renal dialysis: Secondary | ICD-10-CM | POA: Diagnosis not present

## 2021-03-17 ENCOUNTER — Emergency Department (HOSPITAL_COMMUNITY)
Admission: EM | Admit: 2021-03-17 | Discharge: 2021-03-17 | Disposition: A | Discharge status: Skilled Nursing Facility | Payer: Medicare Other | Attending: Emergency Medicine | Admitting: Emergency Medicine

## 2021-03-17 ENCOUNTER — Emergency Department (HOSPITAL_COMMUNITY): Payer: Medicare Other

## 2021-03-17 DIAGNOSIS — I120 Hypertensive chronic kidney disease with stage 5 chronic kidney disease or end stage renal disease: Secondary | ICD-10-CM | POA: Insufficient documentation

## 2021-03-17 DIAGNOSIS — R531 Weakness: Secondary | ICD-10-CM | POA: Diagnosis not present

## 2021-03-17 DIAGNOSIS — M545 Low back pain, unspecified: Secondary | ICD-10-CM | POA: Insufficient documentation

## 2021-03-17 DIAGNOSIS — N189 Chronic kidney disease, unspecified: Secondary | ICD-10-CM

## 2021-03-17 DIAGNOSIS — R0602 Shortness of breath: Secondary | ICD-10-CM | POA: Diagnosis not present

## 2021-03-17 DIAGNOSIS — Z992 Dependence on renal dialysis: Secondary | ICD-10-CM | POA: Diagnosis not present

## 2021-03-17 DIAGNOSIS — Z79899 Other long term (current) drug therapy: Secondary | ICD-10-CM | POA: Insufficient documentation

## 2021-03-17 DIAGNOSIS — L97521 Non-pressure chronic ulcer of other part of left foot limited to breakdown of skin: Secondary | ICD-10-CM | POA: Diagnosis not present

## 2021-03-17 DIAGNOSIS — I129 Hypertensive chronic kidney disease with stage 1 through stage 4 chronic kidney disease, or unspecified chronic kidney disease: Secondary | ICD-10-CM | POA: Diagnosis not present

## 2021-03-17 DIAGNOSIS — Z7901 Long term (current) use of anticoagulants: Secondary | ICD-10-CM | POA: Diagnosis not present

## 2021-03-17 DIAGNOSIS — I517 Cardiomegaly: Secondary | ICD-10-CM | POA: Diagnosis not present

## 2021-03-17 DIAGNOSIS — I4891 Unspecified atrial fibrillation: Secondary | ICD-10-CM | POA: Diagnosis not present

## 2021-03-17 DIAGNOSIS — Z0000 Encounter for general adult medical examination without abnormal findings: Secondary | ICD-10-CM | POA: Diagnosis not present

## 2021-03-17 DIAGNOSIS — R279 Unspecified lack of coordination: Secondary | ICD-10-CM | POA: Diagnosis not present

## 2021-03-17 DIAGNOSIS — R410 Disorientation, unspecified: Secondary | ICD-10-CM | POA: Diagnosis not present

## 2021-03-17 DIAGNOSIS — N186 End stage renal disease: Secondary | ICD-10-CM | POA: Diagnosis not present

## 2021-03-17 DIAGNOSIS — R600 Localized edema: Secondary | ICD-10-CM | POA: Diagnosis not present

## 2021-03-17 DIAGNOSIS — G8929 Other chronic pain: Secondary | ICD-10-CM | POA: Insufficient documentation

## 2021-03-17 DIAGNOSIS — Z743 Need for continuous supervision: Secondary | ICD-10-CM | POA: Diagnosis not present

## 2021-03-17 DIAGNOSIS — N390 Urinary tract infection, site not specified: Secondary | ICD-10-CM | POA: Diagnosis not present

## 2021-03-17 DIAGNOSIS — M25141 Fistula, right hand: Secondary | ICD-10-CM | POA: Diagnosis not present

## 2021-03-17 DIAGNOSIS — D649 Anemia, unspecified: Secondary | ICD-10-CM

## 2021-03-17 DIAGNOSIS — Z299 Encounter for prophylactic measures, unspecified: Secondary | ICD-10-CM | POA: Diagnosis not present

## 2021-03-17 DIAGNOSIS — R0902 Hypoxemia: Secondary | ICD-10-CM | POA: Diagnosis not present

## 2021-03-17 LAB — CBC
HCT: 29.8 % — ABNORMAL LOW (ref 36.0–46.0)
Hemoglobin: 8.8 g/dL — ABNORMAL LOW (ref 12.0–15.0)
MCH: 32.1 pg (ref 26.0–34.0)
MCHC: 29.5 g/dL — ABNORMAL LOW (ref 30.0–36.0)
MCV: 108.8 fL — ABNORMAL HIGH (ref 80.0–100.0)
Platelets: 249 10*3/uL (ref 150–400)
RBC: 2.74 MIL/uL — ABNORMAL LOW (ref 3.87–5.11)
RDW: 15.4 % (ref 11.5–15.5)
WBC: 7.3 10*3/uL (ref 4.0–10.5)
nRBC: 0 % (ref 0.0–0.2)

## 2021-03-17 LAB — BASIC METABOLIC PANEL
Anion gap: 13 (ref 5–15)
BUN: 31 mg/dL — ABNORMAL HIGH (ref 8–23)
CO2: 27 mmol/L (ref 22–32)
Calcium: 9.1 mg/dL (ref 8.9–10.3)
Chloride: 90 mmol/L — ABNORMAL LOW (ref 98–111)
Creatinine, Ser: 4.07 mg/dL — ABNORMAL HIGH (ref 0.44–1.00)
GFR, Estimated: 10 mL/min — ABNORMAL LOW (ref >60)
Glucose, Bld: 129 mg/dL — ABNORMAL HIGH (ref 70–99)
Potassium: 3.6 mmol/L (ref 3.5–5.1)
Sodium: 130 mmol/L — ABNORMAL LOW (ref 135–145)

## 2021-03-17 LAB — BLOOD GAS, ARTERIAL
Acid-Base Excess: 2.8 mmol/L — ABNORMAL HIGH (ref 0.0–2.0)
Bicarbonate: 26.5 mmol/L (ref 20.0–28.0)
Drawn by: 27733
FIO2: 21
O2 Saturation: 92.6 %
Patient temperature: 36.8
pCO2 arterial: 51.2 mmHg — ABNORMAL HIGH (ref 32.0–48.0)
pH, Arterial: 7.354 (ref 7.350–7.450)
pO2, Arterial: 76.4 mmHg — ABNORMAL LOW (ref 83.0–108.0)

## 2021-03-17 MED ORDER — ONDANSETRON HCL 4 MG/2ML IJ SOLN: 4.0000 mg | INTRAMUSCULAR | Status: DC

## 2021-03-17 NOTE — Discharge Instructions (Signed)
Your testing today did not show any signs of low oxygen, and arterial blood gas showed that your oxygen was 93% without additional oxygen and your x-ray looked okay.  Your blood work looked okay and in fact you are not as anemic as you have been in the past which is a good thing.  Please return to the emergency department for severe or worsening symptoms, you can follow-up with your doctor at the facility within 48 hours as needed and make sure that you are not missing dialysis

## 2021-03-17 NOTE — ED Notes (Signed)
Pt removed IV, site clean and not bleeding.

## 2021-03-17 NOTE — ED Notes (Signed)
Multiple attempts made to give facility report on pt. [facility] here to transport pt back to facility.

## 2021-03-17 NOTE — ED Triage Notes (Signed)
Patient brought in by [facility] for low oxygen saturation.  EMS states that the patient oxygen saturation was between 32-96 on the monitor.  They placed the patient on 15 lpm via NRB.  Maintained oxygen saturation at 96.  Patient states that she is having back pain.

## 2021-03-17 NOTE — ED Notes (Signed)
Attempted to call report to facility X 2.

## 2021-03-17 NOTE — ED Provider Notes (Signed)
[facility] Provider Note   CSN: [id number] Arrival date & time: 03/17/21  1539     History  No chief complaint on file.   Jody [name] is a 81 y.o. female.  HPI  This patient is an 81 year old female on dialysis, accesses in her right upper extremity.  She is also on Eliquis which she takes every day, she is on diltiazem, furosemide, metoprolol and Mirapex.  The patient presents from her nursing facility where she was seen to be slightly hypoxic.  It is not clear exactly what that means because when the paramedics found her oxygen was 96% on room air, they then had readings that were as low as 30, then back up to the 90s.  At no point did the paramedics report upon questioning that the patient was short of breath or appeared short of breath whatsoever.  The patient did complete a complete session of dialysis this morning prior to this happening.  The patient is not complaining of anything at this time other than chronic back pain  Home Medications Prior to Admission medications   Medication Sig Start Date End Date Taking? Authorizing Provider  albuterol (ACCUNEB) 0.63 MG/3ML nebulizer solution Take 1 ampule by nebulization every 6 (six) hours as needed for wheezing.    [provider]  albuterol (PROVENTIL HFA;VENTOLIN HFA) 108 (90 BASE) MCG/ACT inhaler Inhale 1-2 puffs into the lungs every 6 (six) hours as needed for wheezing or shortness of breath. Reported on 03/01/2015    [provider]  apixaban (ELIQUIS) 2.5 MG TABS tablet Take by mouth daily.    [provider]  diltiazem (CARDIZEM CD) 120 MG 24 hr capsule Take 120 mg by mouth daily. 02/02/21   [provider]  fluticasone-salmeterol (ADVAIR HFA) 230-21 MCG/ACT inhaler Inhale 2 puffs into the lungs 2 (two) times daily.    [provider]  furosemide (LASIX) 40 MG tablet Take 40 mg by mouth.    [provider]  gabapentin (NEURONTIN) 100 MG capsule Take  100 mg by mouth 3 (three) times daily.    [provider]  metoprolol tartrate (LOPRESSOR) 25 MG tablet Take 0.5 tablets (12.5 mg total) by mouth 2 (two) times daily. Patient not taking: Reported on 01/12/2021 10/18/16   Kathie Dike, MD  pramipexole (MIRAPEX) 0.125 MG tablet Take 0.125 mg by mouth daily.    [provider]  sevelamer carbonate (RENVELA) 800 MG tablet Take 800 mg by mouth 3 (three) times daily with meals.  07/22/19   [provider]      Allergies    Codeine    Review of Systems   Review of Systems  All other systems reviewed and are negative.  Physical Exam Updated Vital Signs BP (!) 114/56 (BP Location: Left Arm)    Pulse (!) 53    Resp 20    SpO2 95%  Physical Exam Vitals and nursing note reviewed.  Constitutional:      General: She is not in acute distress.    Appearance: She is well-developed.  HENT:     Head: Normocephalic and atraumatic.     Mouth/Throat:     Mouth: Mucous membranes are dry.     Pharynx: No oropharyngeal exudate.  Eyes:     General: No scleral icterus.       Right eye: No discharge.        Left eye: No discharge.     Conjunctiva/sclera: Conjunctivae normal.     Pupils: Pupils are  equal, round, and reactive to light.  Neck:     Thyroid: No thyromegaly.     Vascular: No JVD.  Cardiovascular:     Rate and Rhythm: Normal rate and regular rhythm.     Heart sounds: Normal heart sounds. No murmur heard.   No friction rub. No gallop.     Comments: Fistula in the right upper extremity with good thrill Pulmonary:     Effort: Pulmonary effort is normal. No respiratory distress.     Breath sounds: Normal breath sounds. No wheezing or rales.  Abdominal:     General: Bowel sounds are normal. There is no distension.     Palpations: Abdomen is soft. There is no mass.     Tenderness: There is no abdominal tenderness.  Musculoskeletal:        General: No tenderness. Normal range of motion.     Cervical back: Normal  range of motion and neck supple.     Right lower leg: Edema present.     Left lower leg: Edema present.     Comments: 1+ symmetrical pitting pretibial edema  Lymphadenopathy:     Cervical: No cervical adenopathy.  Skin:    General: Skin is warm and dry.     Findings: No erythema or rash.  Neurological:     Mental Status: She is alert.     Coordination: Coordination normal.     Comments: Slightly somnolent but answers questions and follows commands, able to sit up in the bed with minimal assistance  Psychiatric:        Behavior: Behavior normal.    ED Results / Procedures / Treatments   Labs (all labs ordered are listed, but only abnormal results are displayed) Labs Reviewed  BLOOD GAS, ARTERIAL - Abnormal; Notable for the following components:      Result Value   pCO2 arterial 51.2 (*)    pO2, Arterial 76.4 (*)    Acid-Base Excess 2.8 (*)    All other components within normal limits  CBC - Abnormal; Notable for the following components:   RBC 2.74 (*)    Hemoglobin 8.8 (*)    HCT 29.8 (*)    MCV 108.8 (*)    MCHC 29.5 (*)    All other components within normal limits  BASIC METABOLIC PANEL - Abnormal; Notable for the following components:   Sodium 130 (*)    Chloride 90 (*)    Glucose, Bld 129 (*)    BUN 31 (*)    Creatinine, Ser 4.07 (*)    GFR, Estimated 10 (*)    All other components within normal limits    EKG None  Radiology DG Chest Port 1 View  Result Date: 03/17/2021 CLINICAL DATA:  Shortness of breath EXAM: PORTABLE CHEST 1 VIEW COMPARISON:  03/04/2021 FINDINGS: Cardiomegaly. Both lungs are clear. The visualized skeletal structures are unremarkable. IMPRESSION: Cardiomegaly without acute abnormality of the lungs in AP portable projection. Electronically Signed   By: Delanna Ahmadi M.D.   On: 03/17/2021 16:48    Procedures Procedures    Medications Ordered in ED Medications - No data to display  ED Course/ Medical Decision Making/ A&P                            Medical Decision Making Amount and/or Complexity of Data Reviewed Labs: ordered. Radiology: ordered. ECG/medicine tests: ordered.   This patient presents to the ED for concern of hypoxia, this involves  an extensive number of treatment options, and is a complaint that carries with it a high risk of complications and morbidity.  The differential diagnosis includes pulmonary edema, pneumonia, pulmonary embolism seems less likely given that she is on Eliquis.  This could just be not picking up due to poor circulation in general, the patient does have good pulses and a good thrill in the fistula.   Co morbidities that complicate the patient evaluation  Dialysis, hypertension, chronic debility   Additional history obtained:  Additional history obtained from electronic medical record External records from outside source obtained and reviewed including the patient had been admitted to the hospital in January 2023 on the 20th approximately 2 weeks ago.  She had had several visits over the last couple of months for falls chronic pain including chronic lower back pain.  I have reviewed the hospital admission and discharge summaries.  She actually had an MRI of her brain on January 23 which showed no evidence of acute abnormalities, chronic small vessel disease present.   Lab Tests:  I Ordered, and personally interpreted labs.  The pertinent results include: CBC metabolic panel and an ABG, there is no signs of hypoxia, the oxygen saturation is around 93%, no acidosis.  CBC shows improved hemoglobin compared to prior.   Imaging Studies ordered:  I ordered imaging studies including portable chest x-ray I independently visualized and interpreted imaging which showed no acute findings of pneumonia or pneumothorax I agree with the radiologist interpretation   Cardiac Monitoring:  The patient was maintained on a cardiac monitor.  I personally viewed and interpreted the cardiac monitored  which showed an underlying rhythm of: Normal sinus rhythm    Test Considered:  CT scan of the chest but no signs of abnormal lung findings and on anticoagulants making PE less likely   Critical Interventions:  ABG to rule out hypoxia    Problem List / ED Course:  The patient did not require any additional oxygen, she was fine without it.  ABG proved she is not hypoxic.  Stable for discharge, this fits with the patient who is not short of breath whatsoever.   Reevaluation:  After the interventions noted above, I reevaluated the patient and found that they have :improved   Social Determinants of Health:  Nursing home patient   Dispostion:  After consideration of the diagnostic results and the patients response to treatment, I feel that the patent would benefit from discharge home.          Final Clinical Impression(s) / ED Diagnoses Final diagnoses:  Chronic renal failure, unspecified CKD stage  Chronic anemia    Rx / DC Orders ED Discharge Orders     None         Noemi Chapel, MD 03/17/21 1800

## 2021-03-19 DIAGNOSIS — N186 End stage renal disease: Secondary | ICD-10-CM | POA: Diagnosis not present

## 2021-03-19 DIAGNOSIS — L97521 Non-pressure chronic ulcer of other part of left foot limited to breakdown of skin: Secondary | ICD-10-CM | POA: Diagnosis not present

## 2021-03-19 DIAGNOSIS — Z992 Dependence on renal dialysis: Secondary | ICD-10-CM | POA: Diagnosis not present

## 2021-03-22 DIAGNOSIS — N2581 Secondary hyperparathyroidism of renal origin: Secondary | ICD-10-CM | POA: Diagnosis not present

## 2021-03-22 DIAGNOSIS — Z992 Dependence on renal dialysis: Secondary | ICD-10-CM | POA: Diagnosis not present

## 2021-03-22 DIAGNOSIS — N186 End stage renal disease: Secondary | ICD-10-CM | POA: Diagnosis not present

## 2021-03-22 DIAGNOSIS — L97521 Non-pressure chronic ulcer of other part of left foot limited to breakdown of skin: Secondary | ICD-10-CM | POA: Diagnosis not present

## 2021-03-24 DIAGNOSIS — Z992 Dependence on renal dialysis: Secondary | ICD-10-CM | POA: Diagnosis not present

## 2021-03-24 DIAGNOSIS — N186 End stage renal disease: Secondary | ICD-10-CM | POA: Diagnosis not present

## 2021-03-24 DIAGNOSIS — L97521 Non-pressure chronic ulcer of other part of left foot limited to breakdown of skin: Secondary | ICD-10-CM | POA: Diagnosis not present

## 2021-03-26 DIAGNOSIS — L97521 Non-pressure chronic ulcer of other part of left foot limited to breakdown of skin: Secondary | ICD-10-CM | POA: Diagnosis not present

## 2021-03-26 DIAGNOSIS — N186 End stage renal disease: Secondary | ICD-10-CM | POA: Diagnosis not present

## 2021-03-26 DIAGNOSIS — Z992 Dependence on renal dialysis: Secondary | ICD-10-CM | POA: Diagnosis not present

## 2021-03-29 DIAGNOSIS — N186 End stage renal disease: Secondary | ICD-10-CM | POA: Diagnosis not present

## 2021-03-29 DIAGNOSIS — L97521 Non-pressure chronic ulcer of other part of left foot limited to breakdown of skin: Secondary | ICD-10-CM | POA: Diagnosis not present

## 2021-03-29 DIAGNOSIS — Z992 Dependence on renal dialysis: Secondary | ICD-10-CM | POA: Diagnosis not present

## 2021-03-30 ENCOUNTER — Encounter (HOSPITAL_COMMUNITY): Payer: Self-pay | Admitting: Oncology

## 2021-03-30 DIAGNOSIS — R6889 Other general symptoms and signs: Secondary | ICD-10-CM | POA: Diagnosis not present

## 2021-03-30 DIAGNOSIS — I5032 Chronic diastolic (congestive) heart failure: Secondary | ICD-10-CM | POA: Diagnosis not present

## 2021-03-30 DIAGNOSIS — J9601 Acute respiratory failure with hypoxia: Secondary | ICD-10-CM | POA: Diagnosis not present

## 2021-03-30 DIAGNOSIS — M6281 Muscle weakness (generalized): Secondary | ICD-10-CM | POA: Diagnosis not present

## 2021-03-31 DIAGNOSIS — Z992 Dependence on renal dialysis: Secondary | ICD-10-CM | POA: Diagnosis not present

## 2021-03-31 DIAGNOSIS — L97521 Non-pressure chronic ulcer of other part of left foot limited to breakdown of skin: Secondary | ICD-10-CM | POA: Diagnosis not present

## 2021-03-31 DIAGNOSIS — N186 End stage renal disease: Secondary | ICD-10-CM | POA: Diagnosis not present

## 2021-04-02 DIAGNOSIS — N186 End stage renal disease: Secondary | ICD-10-CM | POA: Diagnosis not present

## 2021-04-02 DIAGNOSIS — Z992 Dependence on renal dialysis: Secondary | ICD-10-CM | POA: Diagnosis not present

## 2021-04-02 DIAGNOSIS — L97521 Non-pressure chronic ulcer of other part of left foot limited to breakdown of skin: Secondary | ICD-10-CM | POA: Diagnosis not present

## 2021-04-04 ENCOUNTER — Other Ambulatory Visit: Payer: Self-pay

## 2021-04-04 ENCOUNTER — Emergency Department (HOSPITAL_COMMUNITY): Payer: Medicare Other

## 2021-04-04 ENCOUNTER — Ambulatory Visit (INDEPENDENT_AMBULATORY_CARE_PROVIDER_SITE_OTHER): Payer: Medicare Other

## 2021-04-04 ENCOUNTER — Ambulatory Visit (INDEPENDENT_AMBULATORY_CARE_PROVIDER_SITE_OTHER): Payer: Medicare Other | Admitting: Podiatry

## 2021-04-04 ENCOUNTER — Inpatient Hospital Stay (HOSPITAL_COMMUNITY)
Admission: EM | Admit: 2021-04-04 | Discharge: 2021-04-15 | DRG: 616 | Disposition: A | Discharge status: Skilled Nursing Facility | Payer: Medicare Other | Attending: Internal Medicine | Admitting: Internal Medicine

## 2021-04-04 VITALS — BP 91/65 | HR 61 | Temp 97.5°F

## 2021-04-04 DIAGNOSIS — N2581 Secondary hyperparathyroidism of renal origin: Secondary | ICD-10-CM | POA: Diagnosis not present

## 2021-04-04 DIAGNOSIS — Z8673 Personal history of transient ischemic attack (TIA), and cerebral infarction without residual deficits: Secondary | ICD-10-CM

## 2021-04-04 DIAGNOSIS — I509 Heart failure, unspecified: Secondary | ICD-10-CM | POA: Diagnosis not present

## 2021-04-04 DIAGNOSIS — I482 Chronic atrial fibrillation, unspecified: Secondary | ICD-10-CM | POA: Diagnosis present

## 2021-04-04 DIAGNOSIS — Z7982 Long term (current) use of aspirin: Secondary | ICD-10-CM

## 2021-04-04 DIAGNOSIS — E785 Hyperlipidemia, unspecified: Secondary | ICD-10-CM | POA: Diagnosis present

## 2021-04-04 DIAGNOSIS — I252 Old myocardial infarction: Secondary | ICD-10-CM

## 2021-04-04 DIAGNOSIS — E1169 Type 2 diabetes mellitus with other specified complication: Principal | ICD-10-CM | POA: Diagnosis present

## 2021-04-04 DIAGNOSIS — M7989 Other specified soft tissue disorders: Secondary | ICD-10-CM | POA: Diagnosis not present

## 2021-04-04 DIAGNOSIS — I10 Essential (primary) hypertension: Secondary | ICD-10-CM | POA: Diagnosis not present

## 2021-04-04 DIAGNOSIS — Z79899 Other long term (current) drug therapy: Secondary | ICD-10-CM

## 2021-04-04 DIAGNOSIS — R531 Weakness: Secondary | ICD-10-CM | POA: Diagnosis not present

## 2021-04-04 DIAGNOSIS — N186 End stage renal disease: Secondary | ICD-10-CM | POA: Diagnosis present

## 2021-04-04 DIAGNOSIS — I132 Hypertensive heart and chronic kidney disease with heart failure and with stage 5 chronic kidney disease, or end stage renal disease: Secondary | ICD-10-CM | POA: Diagnosis present

## 2021-04-04 DIAGNOSIS — J9621 Acute and chronic respiratory failure with hypoxia: Secondary | ICD-10-CM

## 2021-04-04 DIAGNOSIS — E1122 Type 2 diabetes mellitus with diabetic chronic kidney disease: Secondary | ICD-10-CM | POA: Diagnosis present

## 2021-04-04 DIAGNOSIS — I5043 Acute on chronic combined systolic (congestive) and diastolic (congestive) heart failure: Secondary | ICD-10-CM | POA: Diagnosis present

## 2021-04-04 DIAGNOSIS — R2689 Other abnormalities of gait and mobility: Secondary | ICD-10-CM | POA: Diagnosis not present

## 2021-04-04 DIAGNOSIS — I4891 Unspecified atrial fibrillation: Secondary | ICD-10-CM | POA: Diagnosis not present

## 2021-04-04 DIAGNOSIS — M86171 Other acute osteomyelitis, right ankle and foot: Secondary | ICD-10-CM | POA: Diagnosis not present

## 2021-04-04 DIAGNOSIS — J9610 Chronic respiratory failure, unspecified whether with hypoxia or hypercapnia: Secondary | ICD-10-CM | POA: Diagnosis not present

## 2021-04-04 DIAGNOSIS — M86072 Acute hematogenous osteomyelitis, left ankle and foot: Secondary | ICD-10-CM | POA: Diagnosis not present

## 2021-04-04 DIAGNOSIS — D631 Anemia in chronic kidney disease: Secondary | ICD-10-CM | POA: Diagnosis present

## 2021-04-04 DIAGNOSIS — M898X9 Other specified disorders of bone, unspecified site: Secondary | ICD-10-CM | POA: Diagnosis present

## 2021-04-04 DIAGNOSIS — J9611 Chronic respiratory failure with hypoxia: Secondary | ICD-10-CM | POA: Diagnosis present

## 2021-04-04 DIAGNOSIS — M79606 Pain in leg, unspecified: Secondary | ICD-10-CM

## 2021-04-04 DIAGNOSIS — M009 Pyogenic arthritis, unspecified: Secondary | ICD-10-CM | POA: Diagnosis present

## 2021-04-04 DIAGNOSIS — R410 Disorientation, unspecified: Secondary | ICD-10-CM | POA: Diagnosis not present

## 2021-04-04 DIAGNOSIS — I70262 Atherosclerosis of native arteries of extremities with gangrene, left leg: Secondary | ICD-10-CM | POA: Diagnosis not present

## 2021-04-04 DIAGNOSIS — R413 Other amnesia: Secondary | ICD-10-CM | POA: Diagnosis present

## 2021-04-04 DIAGNOSIS — L03032 Cellulitis of left toe: Secondary | ICD-10-CM | POA: Diagnosis not present

## 2021-04-04 DIAGNOSIS — Z885 Allergy status to narcotic agent status: Secondary | ICD-10-CM

## 2021-04-04 DIAGNOSIS — M778 Other enthesopathies, not elsewhere classified: Secondary | ICD-10-CM | POA: Diagnosis not present

## 2021-04-04 DIAGNOSIS — Z992 Dependence on renal dialysis: Secondary | ICD-10-CM | POA: Diagnosis not present

## 2021-04-04 DIAGNOSIS — E11621 Type 2 diabetes mellitus with foot ulcer: Secondary | ICD-10-CM | POA: Diagnosis present

## 2021-04-04 DIAGNOSIS — I5042 Chronic combined systolic (congestive) and diastolic (congestive) heart failure: Secondary | ICD-10-CM | POA: Diagnosis present

## 2021-04-04 DIAGNOSIS — Z7401 Bed confinement status: Secondary | ICD-10-CM | POA: Diagnosis not present

## 2021-04-04 DIAGNOSIS — Z20822 Contact with and (suspected) exposure to covid-19: Secondary | ICD-10-CM | POA: Diagnosis not present

## 2021-04-04 DIAGNOSIS — L97528 Non-pressure chronic ulcer of other part of left foot with other specified severity: Secondary | ICD-10-CM

## 2021-04-04 DIAGNOSIS — S91302A Unspecified open wound, left foot, initial encounter: Secondary | ICD-10-CM | POA: Diagnosis not present

## 2021-04-04 DIAGNOSIS — M869 Osteomyelitis, unspecified: Secondary | ICD-10-CM | POA: Diagnosis not present

## 2021-04-04 DIAGNOSIS — E876 Hypokalemia: Secondary | ICD-10-CM | POA: Diagnosis not present

## 2021-04-04 DIAGNOSIS — N189 Chronic kidney disease, unspecified: Secondary | ICD-10-CM

## 2021-04-04 DIAGNOSIS — E1152 Type 2 diabetes mellitus with diabetic peripheral angiopathy with gangrene: Secondary | ICD-10-CM | POA: Diagnosis present

## 2021-04-04 DIAGNOSIS — M8610 Other acute osteomyelitis, unspecified site: Secondary | ICD-10-CM

## 2021-04-04 DIAGNOSIS — Z87891 Personal history of nicotine dependence: Secondary | ICD-10-CM

## 2021-04-04 DIAGNOSIS — M86172 Other acute osteomyelitis, left ankle and foot: Secondary | ICD-10-CM | POA: Diagnosis not present

## 2021-04-04 DIAGNOSIS — Z7901 Long term (current) use of anticoagulants: Secondary | ICD-10-CM

## 2021-04-04 DIAGNOSIS — E871 Hypo-osmolality and hyponatremia: Secondary | ICD-10-CM | POA: Diagnosis not present

## 2021-04-04 DIAGNOSIS — I5033 Acute on chronic diastolic (congestive) heart failure: Secondary | ICD-10-CM | POA: Diagnosis present

## 2021-04-04 DIAGNOSIS — I70234 Atherosclerosis of native arteries of right leg with ulceration of heel and midfoot: Secondary | ICD-10-CM | POA: Diagnosis not present

## 2021-04-04 DIAGNOSIS — L97529 Non-pressure chronic ulcer of other part of left foot with unspecified severity: Secondary | ICD-10-CM | POA: Diagnosis not present

## 2021-04-04 DIAGNOSIS — M86672 Other chronic osteomyelitis, left ankle and foot: Secondary | ICD-10-CM | POA: Diagnosis not present

## 2021-04-04 DIAGNOSIS — Z89412 Acquired absence of left great toe: Secondary | ICD-10-CM | POA: Diagnosis not present

## 2021-04-04 DIAGNOSIS — Z9981 Dependence on supplemental oxygen: Secondary | ICD-10-CM | POA: Diagnosis not present

## 2021-04-04 DIAGNOSIS — I739 Peripheral vascular disease, unspecified: Secondary | ICD-10-CM | POA: Diagnosis not present

## 2021-04-04 DIAGNOSIS — R609 Edema, unspecified: Secondary | ICD-10-CM | POA: Diagnosis not present

## 2021-04-04 DIAGNOSIS — Z8249 Family history of ischemic heart disease and other diseases of the circulatory system: Secondary | ICD-10-CM

## 2021-04-04 DIAGNOSIS — M79605 Pain in left leg: Secondary | ICD-10-CM | POA: Diagnosis not present

## 2021-04-04 DIAGNOSIS — Z96651 Presence of right artificial knee joint: Secondary | ICD-10-CM | POA: Diagnosis present

## 2021-04-04 DIAGNOSIS — L0390 Cellulitis, unspecified: Secondary | ICD-10-CM | POA: Diagnosis not present

## 2021-04-04 DIAGNOSIS — J449 Chronic obstructive pulmonary disease, unspecified: Secondary | ICD-10-CM | POA: Diagnosis not present

## 2021-04-04 DIAGNOSIS — I5032 Chronic diastolic (congestive) heart failure: Secondary | ICD-10-CM | POA: Diagnosis not present

## 2021-04-04 DIAGNOSIS — E7800 Pure hypercholesterolemia, unspecified: Secondary | ICD-10-CM | POA: Diagnosis present

## 2021-04-04 DIAGNOSIS — R001 Bradycardia, unspecified: Secondary | ICD-10-CM | POA: Diagnosis not present

## 2021-04-04 DIAGNOSIS — M6281 Muscle weakness (generalized): Secondary | ICD-10-CM | POA: Diagnosis not present

## 2021-04-04 DIAGNOSIS — G9341 Metabolic encephalopathy: Secondary | ICD-10-CM | POA: Diagnosis not present

## 2021-04-04 DIAGNOSIS — E1121 Type 2 diabetes mellitus with diabetic nephropathy: Secondary | ICD-10-CM | POA: Diagnosis present

## 2021-04-04 DIAGNOSIS — I70221 Atherosclerosis of native arteries of extremities with rest pain, right leg: Secondary | ICD-10-CM | POA: Diagnosis not present

## 2021-04-04 DIAGNOSIS — M79672 Pain in left foot: Secondary | ICD-10-CM

## 2021-04-04 DIAGNOSIS — I120 Hypertensive chronic kidney disease with stage 5 chronic kidney disease or end stage renal disease: Secondary | ICD-10-CM | POA: Diagnosis not present

## 2021-04-04 DIAGNOSIS — L97524 Non-pressure chronic ulcer of other part of left foot with necrosis of bone: Secondary | ICD-10-CM

## 2021-04-04 DIAGNOSIS — Z743 Need for continuous supervision: Secondary | ICD-10-CM | POA: Diagnosis not present

## 2021-04-04 DIAGNOSIS — E1129 Type 2 diabetes mellitus with other diabetic kidney complication: Secondary | ICD-10-CM | POA: Diagnosis not present

## 2021-04-04 DIAGNOSIS — N250 Renal osteodystrophy: Secondary | ICD-10-CM | POA: Diagnosis not present

## 2021-04-04 DIAGNOSIS — Z09 Encounter for follow-up examination after completed treatment for conditions other than malignant neoplasm: Secondary | ICD-10-CM

## 2021-04-04 LAB — CBC WITH DIFFERENTIAL/PLATELET
Abs Immature Granulocytes: 0.04 10*3/uL (ref 0.00–0.07)
Basophils Absolute: 0.1 10*3/uL (ref 0.0–0.1)
Basophils Relative: 1 %
Eosinophils Absolute: 0.1 10*3/uL (ref 0.0–0.5)
Eosinophils Relative: 2 %
HCT: 27.4 % — ABNORMAL LOW (ref 36.0–46.0)
Hemoglobin: 8.3 g/dL — ABNORMAL LOW (ref 12.0–15.0)
Immature Granulocytes: 1 %
Lymphocytes Relative: 16 %
Lymphs Abs: 0.9 10*3/uL (ref 0.7–4.0)
MCH: 35 pg — ABNORMAL HIGH (ref 26.0–34.0)
MCHC: 30.3 g/dL (ref 30.0–36.0)
MCV: 115.6 fL — ABNORMAL HIGH (ref 80.0–100.0)
Monocytes Absolute: 0.6 10*3/uL (ref 0.1–1.0)
Monocytes Relative: 10 %
Neutro Abs: 3.8 10*3/uL (ref 1.7–7.7)
Neutrophils Relative %: 70 %
Platelets: 165 10*3/uL (ref 150–400)
RBC: 2.37 MIL/uL — ABNORMAL LOW (ref 3.87–5.11)
RDW: 19 % — ABNORMAL HIGH (ref 11.5–15.5)
WBC: 5.4 10*3/uL (ref 4.0–10.5)
nRBC: 0 % (ref 0.0–0.2)

## 2021-04-04 LAB — COMPREHENSIVE METABOLIC PANEL
ALT: 10 U/L (ref 0–44)
AST: 17 U/L (ref 15–41)
Albumin: 3.1 g/dL — ABNORMAL LOW (ref 3.5–5.0)
Alkaline Phosphatase: 84 U/L (ref 38–126)
Anion gap: 12 (ref 5–15)
BUN: 27 mg/dL — ABNORMAL HIGH (ref 8–23)
CO2: 25 mmol/L (ref 22–32)
Calcium: 10.2 mg/dL (ref 8.9–10.3)
Chloride: 92 mmol/L — ABNORMAL LOW (ref 98–111)
Creatinine, Ser: 4.65 mg/dL — ABNORMAL HIGH (ref 0.44–1.00)
GFR, Estimated: 9 mL/min — ABNORMAL LOW (ref >60)
Glucose, Bld: 110 mg/dL — ABNORMAL HIGH (ref 70–99)
Potassium: 3.5 mmol/L (ref 3.5–5.1)
Sodium: 129 mmol/L — ABNORMAL LOW (ref 135–145)
Total Bilirubin: 0.6 mg/dL (ref 0.3–1.2)
Total Protein: 6.8 g/dL (ref 6.5–8.1)

## 2021-04-04 LAB — RESP PANEL BY RT-PCR (FLU A&B, COVID) ARPGX2
Influenza A by PCR: NEGATIVE
Influenza B by PCR: NEGATIVE
SARS Coronavirus 2 by RT PCR: NEGATIVE

## 2021-04-04 LAB — SEDIMENTATION RATE: Sed Rate: 35 mm/hr — ABNORMAL HIGH (ref 0–22)

## 2021-04-04 LAB — HEPATITIS B SURFACE ANTIGEN: Hepatitis B Surface Ag: NONREACTIVE

## 2021-04-04 LAB — GLUCOSE, CAPILLARY
Glucose-Capillary: 111 mg/dL — ABNORMAL HIGH (ref 70–99)
Glucose-Capillary: 132 mg/dL — ABNORMAL HIGH (ref 70–99)

## 2021-04-04 LAB — VANCOMYCIN, RANDOM: Vancomycin Rm: 22

## 2021-04-04 LAB — C-REACTIVE PROTEIN: CRP: 1.2 mg/dL — ABNORMAL HIGH (ref <1.0)

## 2021-04-04 LAB — LACTIC ACID, PLASMA: Lactic Acid, Venous: 1.6 mmol/L (ref 0.5–1.9)

## 2021-04-04 MED ORDER — SODIUM CHLORIDE 0.9 % IV SOLN: 100.0000 mL | INTRAVENOUS | Status: DC | PRN

## 2021-04-04 MED ORDER — INSULIN ASPART 100 UNIT/ML IJ SOLN: 0.0000 [IU] | Freq: Three times a day (TID) | INTRAMUSCULAR | Status: DC

## 2021-04-04 MED ORDER — SODIUM CHLORIDE 0.9 % IV BOLUS
250.0000 mL | Freq: Once | INTRAVENOUS | Status: AC
Start: 2021-04-04 — End: 2021-04-04
  Administered 2021-04-04: 250 mL via INTRAVENOUS

## 2021-04-04 MED ORDER — GABAPENTIN 100 MG PO CAPS
100.0000 mg | ORAL_CAPSULE | Freq: Three times a day (TID) | ORAL | Status: DC
  Administered 2021-04-04 – 2021-04-11 (×18): 100 mg via ORAL
  Filled 2021-04-04 (×18): qty 1

## 2021-04-04 MED ORDER — SODIUM CHLORIDE 0.9 % IV SOLN
2.0000 g | INTRAVENOUS | Status: DC
  Administered 2021-04-05: 2 g via INTRAVENOUS
  Filled 2021-04-04: qty 2

## 2021-04-04 MED ORDER — CEFEPIME HCL 1 G IJ SOLR
1.0000 g | INTRAMUSCULAR | Status: DC
  Filled 2021-04-04: qty 1

## 2021-04-04 MED ORDER — DILTIAZEM HCL ER COATED BEADS 120 MG PO CP24
120.0000 mg | ORAL_CAPSULE | Freq: Every day | ORAL | Status: DC
  Administered 2021-04-06: 120 mg via ORAL
  Filled 2021-04-04 (×2): qty 1

## 2021-04-04 MED ORDER — FERROUS SULFATE 325 (65 FE) MG PO TABS
325.0000 mg | ORAL_TABLET | Freq: Two times a day (BID) | ORAL | Status: DC
  Administered 2021-04-05 – 2021-04-15 (×20): 325 mg via ORAL
  Filled 2021-04-04 (×20): qty 1

## 2021-04-04 MED ORDER — PRAMIPEXOLE DIHYDROCHLORIDE 0.125 MG PO TABS
0.1250 mg | ORAL_TABLET | Freq: Every day | ORAL | Status: DC
  Administered 2021-04-05 – 2021-04-15 (×10): 0.125 mg via ORAL
  Filled 2021-04-04 (×11): qty 1

## 2021-04-04 MED ORDER — VANCOMYCIN HCL IN DEXTROSE 1-5 GM/200ML-% IV SOLN
1000.0000 mg | INTRAVENOUS | Status: DC
  Administered 2021-04-05 – 2021-04-09 (×4): 1000 mg via INTRAVENOUS
  Filled 2021-04-04 (×5): qty 200

## 2021-04-04 MED ORDER — ACETAMINOPHEN 325 MG PO TABS
650.0000 mg | ORAL_TABLET | Freq: Four times a day (QID) | ORAL | Status: DC | PRN
  Administered 2021-04-05 – 2021-04-14 (×11): 650 mg via ORAL
  Filled 2021-04-04 (×10): qty 2

## 2021-04-04 MED ORDER — ALBUTEROL SULFATE 0.63 MG/3ML IN NEBU: 1.0000 | INHALATION_SOLUTION | Freq: Four times a day (QID) | RESPIRATORY_TRACT | Status: DC | PRN

## 2021-04-04 MED ORDER — LIDOCAINE HCL (PF) 1 % IJ SOLN
5.0000 mL | INTRAMUSCULAR | Status: DC | PRN
  Filled 2021-04-04: qty 5

## 2021-04-04 MED ORDER — VANCOMYCIN HCL 1750 MG/350ML IV SOLN
1750.0000 mg | Freq: Once | INTRAVENOUS | Status: DC
  Filled 2021-04-04: qty 350

## 2021-04-04 MED ORDER — METRONIDAZOLE 500 MG/100ML IV SOLN
500.0000 mg | Freq: Two times a day (BID) | INTRAVENOUS | Status: DC
  Administered 2021-04-04 – 2021-04-13 (×18): 500 mg via INTRAVENOUS
  Filled 2021-04-04 (×18): qty 100

## 2021-04-04 MED ORDER — PROSOURCE PLUS PO LIQD
30.0000 mL | Freq: Two times a day (BID) | ORAL | Status: DC
  Administered 2021-04-05 – 2021-04-15 (×14): 30 mL via ORAL
  Filled 2021-04-04 (×13): qty 30

## 2021-04-04 MED ORDER — SODIUM CHLORIDE 0.9 % IV SOLN: 250.0000 mL | INTRAVENOUS | Status: DC | PRN

## 2021-04-04 MED ORDER — SODIUM CHLORIDE 0.9% FLUSH: 3.0000 mL | INTRAVENOUS | Status: DC | PRN

## 2021-04-04 MED ORDER — HEPARIN SODIUM (PORCINE) 1000 UNIT/ML DIALYSIS
1000.0000 [IU] | INTRAMUSCULAR | Status: DC | PRN
  Filled 2021-04-04: qty 1

## 2021-04-04 MED ORDER — ALBUTEROL SULFATE (2.5 MG/3ML) 0.083% IN NEBU: 0.6300 mg | INHALATION_SOLUTION | Freq: Four times a day (QID) | RESPIRATORY_TRACT | Status: DC | PRN

## 2021-04-04 MED ORDER — FUROSEMIDE 40 MG PO TABS
40.0000 mg | ORAL_TABLET | Freq: Every day | ORAL | Status: DC
  Filled 2021-04-04: qty 1

## 2021-04-04 MED ORDER — SEVELAMER CARBONATE 800 MG PO TABS
800.0000 mg | ORAL_TABLET | Freq: Three times a day (TID) | ORAL | Status: DC
  Administered 2021-04-05 – 2021-04-15 (×19): 800 mg via ORAL
  Filled 2021-04-04 (×21): qty 1

## 2021-04-04 MED ORDER — LIDOCAINE-PRILOCAINE 2.5-2.5 % EX CREA: 1.0000 "application " | TOPICAL_CREAM | CUTANEOUS | Status: DC | PRN

## 2021-04-04 MED ORDER — PENTAFLUOROPROP-TETRAFLUOROETH EX AERO: 1.0000 "application " | INHALATION_SPRAY | CUTANEOUS | Status: DC | PRN

## 2021-04-04 MED ORDER — ALTEPLASE 2 MG IJ SOLR
2.0000 mg | Freq: Once | INTRAMUSCULAR | Status: DC | PRN
  Filled 2021-04-04: qty 2

## 2021-04-04 MED ORDER — METOPROLOL TARTRATE 12.5 MG HALF TABLET
12.5000 mg | ORAL_TABLET | Freq: Two times a day (BID) | ORAL | Status: DC
  Administered 2021-04-05 – 2021-04-15 (×13): 12.5 mg via ORAL
  Filled 2021-04-04 (×19): qty 1

## 2021-04-04 MED ORDER — ACETAMINOPHEN 650 MG RE SUPP: 650.0000 mg | Freq: Four times a day (QID) | RECTAL | Status: DC | PRN

## 2021-04-04 MED ORDER — SODIUM CHLORIDE 0.9% FLUSH
3.0000 mL | Freq: Two times a day (BID) | INTRAVENOUS | Status: DC
  Administered 2021-04-04 – 2021-04-13 (×7): 3 mL via INTRAVENOUS

## 2021-04-04 MED ORDER — SODIUM CHLORIDE 0.9 % IV BOLUS: 500.0000 mL | Freq: Once | INTRAVENOUS | Status: DC

## 2021-04-04 NOTE — ED Notes (Signed)
Patient transported to MRI 

## 2021-04-04 NOTE — Progress Notes (Signed)
Subjective:   Patient ID: Jody [name], female   DOB: 81 y.o.   MRN: [id number]   HPI 81 year old female presents the office today with a caregiver for concerns of a wound on her left big toe which started out 2 weeks ago.  The patient thinks that the wound rubbed from her shoe.  She states that she previously had a heel wound which did ultimately heal after soaking in Epsom salts.  She does report fevers and chills but she also states that she has a bad "cold" today as well.  Last A1c was 5.3 on 03/04/2021  Review of Systems  All other systems reviewed and are negative.  Past Medical History:  Diagnosis Date   Anemia in chronic kidney disease (CODE) 06/05/2015   Arthritis    "bad in my legs" (12/07/2014)   Chronic atrial fibrillation ([place]) 06/05/2015   Chronic back pain    "dr said my spine is crooked"   Chronic bronchitis ([place])    "get it q yr" (12/07/2014)   CKD stage 4 due to type 2 diabetes mellitus ([place]) 3/33/5456   Complication of anesthesia    headache for 2 days after surgery in May 2019   COPD (chronic obstructive pulmonary disease) ([place])    Gait difficulty 09/02/2014   Hypercholesterolemia    Hypertension    Neuropathy 2015   in legs   NSVT (nonsustained ventricular tachycardia)    Sinus brady-tachy syndrome ([place])    Stroke ([place]) 05/2014   denies residual on 12/07/2014   Type II diabetes mellitus ([place])     Past Surgical History:  Procedure Laterality Date   ABDOMINAL HYSTERECTOMY  ~ [place]  ~ [place] Right 07/06/2017   Procedure: RIGHT RADIOCEPHALIC ARTERIOVENOUS FISTULA creation;  Surgeon: Conrad Foyil, MD;  Location: [facility];  Service: Vascular;  Laterality: Right;   [place] Right 09/17/2017   Procedure: SECOND STAGE BASILIC VEIN TRANSPOSITION RIGHT ARM;  Surgeon: Rosetta Posner, MD;  Location: [facility] OR;  Service: Vascular;  Laterality: Right;   COLONOSCOPY N/A 10/28/2017   Procedure: COLONOSCOPY;  Surgeon:  Rogene Houston, MD;  [facility];  Service: Endoscopy;  Laterality: N/A;   COLONOSCOPY WITH PROPOFOL N/A 12/29/2019   Procedure: COLONOSCOPY WITH PROPOFOL;  Surgeon: Eloise Harman, DO;  [facility];  Service: Endoscopy;  Laterality: N/A;   ESOPHAGOGASTRODUODENOSCOPY (EGD) WITH PROPOFOL N/A 10/31/2017   Procedure: ESOPHAGOGASTRODUODENOSCOPY (EGD) WITH PROPOFOL;  Surgeon: Rogene Houston, MD;  [facility];  Service: Endoscopy;  Laterality: N/A;   JOINT REPLACEMENT     TOTAL KNEE ARTHROPLASTY Right ~ [place]  ~ 1970   "9# in my stomach"     Current Outpatient Medications:    albuterol (ACCUNEB) 0.63 MG/3ML nebulizer solution, Take 1 ampule by nebulization every 6 (six) hours as needed for wheezing., Disp: , Rfl:    albuterol (PROVENTIL HFA;VENTOLIN HFA) 108 (90 BASE) MCG/ACT inhaler, Inhale 1-2 puffs into the lungs every 6 (six) hours as needed for wheezing or shortness of breath. Reported on 03/01/2015, Disp: , Rfl:    apixaban (ELIQUIS) 2.5 MG TABS tablet, Take by mouth daily., Disp: , Rfl:    diltiazem (CARDIZEM CD) 120 MG 24 hr capsule, Take 120 mg by mouth daily., Disp: , Rfl:    fluticasone-salmeterol (ADVAIR HFA) 230-21 MCG/ACT inhaler, Inhale 2 puffs into the lungs 2 (two) times daily., Disp: , Rfl:  furosemide (LASIX) 40 MG tablet, Take 40 mg by mouth., Disp: , Rfl:    gabapentin (NEURONTIN) 100 MG capsule, Take 100 mg by mouth 3 (three) times daily., Disp: , Rfl:    metoprolol tartrate (LOPRESSOR) 25 MG tablet, Take 0.5 tablets (12.5 mg total) by mouth 2 (two) times daily. (Patient not taking: Reported on 01/12/2021), Disp: 30 tablet, Rfl: 0   pramipexole (MIRAPEX) 0.125 MG tablet, Take 0.125 mg by mouth daily., Disp: , Rfl:    sevelamer carbonate (RENVELA) 800 MG tablet, Take 800 mg by mouth 3 (three) times daily with meals. , Disp: , Rfl:   Allergies  Allergen Reactions   Codeine Nausea And Vomiting          Objective:   Physical Exam  General: AAO x3, NAD  Dermatological: Full-thickness ulceration noted on the medial aspect of the hallux on the left foot with necrotic firm and fibrotic tissue which is probe to bone.  Somewhat clear drainage expressed but there is no purulence.  There is localized edema present around the area.  There is no fluctuation or crepitation.       Vascular: Pulses decreased bilaterally in the feet are cool to touch bilaterally.  Neruologic: Sensation decreased.  Musculoskeletal: Bunions present.  Muscular strength 5/5 in all groups tested bilateral.  Gait: In wheelchair      Assessment:   Left foot ulcer, concern for osteomyelitis; PAD     Plan:  -Treatment options discussed including all alternatives, risks, and complications -Etiology of symptoms were discussed -X-rays were obtained and reviewed with the patient.  Vessel calcification seen on x-ray.  There is some lucency noted on the medial first metatarsal head underneath the area of the wound concerning for osteomyelitis. -Given concern for osteomyelitis, PAD I recommended the patient go to [facility] for further evaluation and likely admission.  Upon admission podiatry will follow but also recommend IV antibiotics, MRI as well as arterial studies and vascular surgery consult.  *Spoke to Judson Roch at [facility] ER to inform of arrival.   Trula Slade DPM

## 2021-04-04 NOTE — Assessment & Plan Note (Addendum)
Likely secondary to renal failure. Continue to follow.

## 2021-04-04 NOTE — Assessment & Plan Note (Addendum)
Presents with Acute osteomyelitis of left big toe, failed IV abx outpatient. -Has been receiving IV vancomycin and cefepime at dialysis since 03/24/21. -Wound culture grew out finegoldia magna. -Continue with Cefepime with HD -Dr Jacqualyn Posey recommend Partial first ray amputation.  -ABI: Right and left resting right ankle-brachial index indicate noncompressible right lower extremity arteries.  Right and left toe brachial index is abnormal. -Underwent  arteriogram 2/22: Successful drug-coated balloon angioplasty of the superficial femoral artery. Unsuccessful recanalization of the anterior tibial artery. -Underwent first toe left foot amputation 2/24.   -ID consult osteomyelitis positive  surgical margined.  -ID recommend 4 week of cefepime with HD>

## 2021-04-04 NOTE — ED Provider Notes (Signed)
Signout from [place] PA-C at shift change. Briefly, patient presents for osteomyelitis of the right foot. Dr. Jacqualyn Posey of podiatry following.    Plan: Admit, IV abx. Added cultures, ABI.     3:34 PM Reassessment performed. Patient appears comfortable.  Labs and imaging personally reviewed and interpreted including: MRI foot agree changes c/w osteomyelitis.    Reviewed additional pertinent lab work and imaging with patient at bedside including: MRI results, need for admission. She is hungry, diet ordered.    Most current vital signs reviewed and are as follows: BP 121/63    Pulse 80    Temp 98.1 F (36.7 C) (Oral)    Resp 18    SpO2 96%   Plan: admit  3:48 PM Spoke with Dr. Arty Baumgartner with renal to make aware of admission, need for routine dialysis.   4:07 PM consulted with Dr. Rogers Blocker with [facility].  She will see patient and admit.    Carlisle Cater, PA-C 04/04/21 1607    Kommor, Debe Coder, MD 04/05/21 Laureen Abrahams

## 2021-04-04 NOTE — ED Notes (Signed)
[facility] in [place] called and said if we need info about [facility] to call [phone] or main number [phone].

## 2021-04-04 NOTE — Assessment & Plan Note (Addendum)
Last echo: 06/2020: EF of 45-50% with mildly reduced LVF. Hypokinesis of the inferoseptal, inferolateral and distal inferior walls . Diastolic parameters indeterminate.  Volume management with HD  Discontinue lasix.

## 2021-04-04 NOTE — Progress Notes (Signed)
Dg left

## 2021-04-04 NOTE — Progress Notes (Addendum)
Pharmacy Antibiotic Note  Jody [name] is a 81 y.o. female admitted on 04/04/2021 with  osteomyelitis .  Pharmacy has been consulted for vancomycin and cefepime dosing.  Patient was on vancomycin 1g qHD and cefepime 2g qHD PTA for osteomyelitis. Per nephrology, a foot wound culture on 03/24/21 grew Finegoldia magna.  Plan: Obtain random vancomycin level to determine if therapeutic or a dose adjustment is necessary Plan for vancomycin 1g qHD Cefepime 2g qHD Trend WBC, temp, renal function  F/U infectious work-up Drug levels as indicated    Temp (24hrs), Avg:97.8 F (36.6 C), Min:97.5 F (36.4 C), Max:98.1 F (36.7 C)  Recent Labs  Lab 04/04/21 1245  WBC 5.4  CREATININE 4.65*  LATICACIDVEN 1.6    CrCl cannot be calculated (Unknown ideal weight.).    Allergies  Allergen Reactions   Codeine Nausea And Vomiting    Antimicrobials this admission: Vancomycin 2/9 >>  Cefepime 2/9 >>   Dose adjustments this admission: None  Microbiology results: 2/20 BCx: Pending 2/9 wound Cx: Finegoldia magna  Thank you for allowing pharmacy to be a part of this patients care.  Joseph Art, Pharm.D. PGY-1 Pharmacy Resident [phone] 04/04/2021 3:56 PM

## 2021-04-04 NOTE — Consult Note (Addendum)
[facility] Renal Consultation Note    Indication for Consultation:  Management of ESRD/hemodialysis, anemia, hypertension/volume, and secondary hyperparathyroidism. PCP:  HPI: Jody [name] is a 81 y.o. female with ESRD, T2DM, COPD (on home O2), A-fib, HTN, Hx brady-tachy syndrome who is being admitted with R foot osteomyelitis.  Has had foot wound for 2 weeks at least. Has been treated with IV abx at her outpatient dialysis clinic since 03/24/2021. Saw podiatry today and referred to ED for admission. She is afebrile. Labs with Na 129, K 3.5, LA 1.6, Wbc 5.4, Hgb 8.3. COVID/flu negative.  C/o headache, but otherwise without complaints. Denies fever, chills, CP, dyspnea, abdominal pain, N/V/D.  Dialyzes on TTS schedule at [facility]. Due for dialysis tomorrow. Uses RUE AVF without recent issues.  Past Medical History:  Diagnosis Date   Anemia in chronic kidney disease (CODE) 06/05/2015   Arthritis    "bad in my legs" (12/07/2014)   Chronic atrial fibrillation ([place]) 06/05/2015   Chronic back pain    "dr said my spine is crooked"   Chronic bronchitis ([place])    "get it q yr" (12/07/2014)   CKD stage 4 due to type 2 diabetes mellitus ([place]) 3/41/9379   Complication of anesthesia    headache for 2 days after surgery in May 2019   COPD (chronic obstructive pulmonary disease) ([place])    Gait difficulty 09/02/2014   Hypercholesterolemia    Hypertension    Neuropathy 2015   in legs   NSVT (nonsustained ventricular tachycardia)    Sinus brady-tachy syndrome ([place])    Stroke ([place]) 05/2014   denies residual on 12/07/2014   Type II diabetes mellitus ([place])    Past Surgical History:  Procedure Laterality Date   ABDOMINAL HYSTERECTOMY  ~ [place]  ~ [place] Right 07/06/2017   Procedure: RIGHT RADIOCEPHALIC ARTERIOVENOUS FISTULA creation;  Surgeon: Conrad Andrews, MD;  Location: [place];  Service: Vascular;  Laterality: Right;   [place] Right 09/17/2017   Procedure: SECOND STAGE BASILIC VEIN TRANSPOSITION RIGHT ARM;  Surgeon: Rosetta Posner, MD;  Location: [facility] OR;  Service: Vascular;  Laterality: Right;   COLONOSCOPY N/A 10/28/2017   Procedure: COLONOSCOPY;  Surgeon: Rogene Houston, MD;  [facility];  Service: Endoscopy;  Laterality: N/A;   COLONOSCOPY WITH PROPOFOL N/A 12/29/2019   Procedure: COLONOSCOPY WITH PROPOFOL;  Surgeon: Eloise Harman, DO;  [facility];  Service: Endoscopy;  Laterality: N/A;   ESOPHAGOGASTRODUODENOSCOPY (EGD) WITH PROPOFOL N/A 10/31/2017   Procedure: ESOPHAGOGASTRODUODENOSCOPY (EGD) WITH PROPOFOL;  Surgeon: Rogene Houston, MD;  [facility];  Service: Endoscopy;  Laterality: N/A;   JOINT REPLACEMENT     TOTAL KNEE ARTHROPLASTY Right ~ [place]  ~ 1970   "9# in my stomach"   Family History  Problem Relation Age of Onset   Cancer Other    Heart attack Brother    Cancer Sister        breast   Alcohol abuse Brother    Social History:  reports that she quit smoking about 17 years ago. Her smoking use included cigarettes. She started smoking about 68 years ago. She has a 25.00 pack-year smoking history. She has never used smokeless tobacco. She reports that she does not drink alcohol and does not use drugs.  ROS: As per HPI otherwise negative.  Physical Exam: Vitals:   04/04/21 1230 04/04/21 1245 04/04/21 1459 04/04/21  1500  BP: (!) 93/51 (!) 92/51 121/63   Pulse:   80   Resp: 14 11 18    Temp:      TempSrc:      SpO2:   96%   Weight:    80 kg  Height:    5\' 7"  (1.702 m)     General: Elderly woman, NAD. Nasal O2 in place. Head: Normocephalic, atraumatic, sclera non-icteric, mucus membranes are moist. Neck: Supple without lymphadenopathy/masses. JVD not elevated. Lungs: Clear in upper lobes, bibasilar crackles. Heart: RRR; 2/6 murmur Abdomen: Soft, non-tender, non-distended with normoactive bowel sounds. Musculoskeletal:   Strength and tone appear normal for age. Lower extremities: No LE edema; R MTP joint with wound and surrounding erythema Neuro: Alert and oriented X 3. Moves all extremities spontaneously. Psych:  Responds to questions appropriately with a normal affect. Dialysis Access: RUE AVF + bruit  Allergies  Allergen Reactions   Codeine Nausea And Vomiting   Prior to Admission medications   Medication Sig Start Date End Date Taking? Authorizing Provider  albuterol (ACCUNEB) 0.63 MG/3ML nebulizer solution Take 1 ampule by nebulization every 6 (six) hours as needed for wheezing.    [provider]  albuterol (PROVENTIL HFA;VENTOLIN HFA) 108 (90 BASE) MCG/ACT inhaler Inhale 1-2 puffs into the lungs every 6 (six) hours as needed for wheezing or shortness of breath. Reported on 03/01/2015    [provider]  apixaban (ELIQUIS) 2.5 MG TABS tablet Take by mouth daily.    [provider]  diltiazem (CARDIZEM CD) 120 MG 24 hr capsule Take 120 mg by mouth daily. 02/02/21   [provider]  fluticasone-salmeterol (ADVAIR HFA) 230-21 MCG/ACT inhaler Inhale 2 puffs into the lungs 2 (two) times daily.    [provider]  furosemide (LASIX) 40 MG tablet Take 40 mg by mouth.    [provider]  gabapentin (NEURONTIN) 100 MG capsule Take 100 mg by mouth 3 (three) times daily.    [provider]  metoprolol tartrate (LOPRESSOR) 25 MG tablet Take 0.5 tablets (12.5 mg total) by mouth 2 (two) times daily. Patient not taking: Reported on 01/12/2021 10/18/16   Kathie Dike, MD  pramipexole (MIRAPEX) 0.125 MG tablet Take 0.125 mg by mouth daily.    [provider]  sevelamer carbonate (RENVELA) 800 MG tablet Take 800 mg by mouth 3 (three) times daily with meals.  07/22/19   [provider]   Current Facility-Administered Medications  Medication Dose Route Frequency Provider Last Rate Last Admin   [START ON 04/05/2021] ceFEPIme (MAXIPIME) 2 g in  sodium chloride 0.9 % 100 mL IVPB  2 g Intravenous Q T,Th,Sa-HD Pauletta Browns, RPH       [START ON 04/05/2021] vancomycin (VANCOCIN) IVPB 1000 mg/200 mL premix  1,000 mg Intravenous Q T,Th,Sa-HD Pauletta Browns, [facility]       Current Outpatient Medications  Medication Sig Dispense Refill   albuterol (ACCUNEB) 0.63 MG/3ML nebulizer solution Take 1 ampule by nebulization every 6 (six) hours as needed for wheezing.     albuterol (PROVENTIL HFA;VENTOLIN HFA) 108 (90 BASE) MCG/ACT inhaler Inhale 1-2 puffs into the lungs every 6 (six) hours as needed for wheezing or shortness of breath. Reported on 03/01/2015     apixaban (ELIQUIS) 2.5 MG TABS tablet Take by mouth daily.     diltiazem (CARDIZEM CD) 120 MG 24 hr capsule Take 120 mg by mouth daily.     fluticasone-salmeterol (ADVAIR HFA) 230-21 MCG/ACT inhaler Inhale 2 puffs into the  lungs 2 (two) times daily.     furosemide (LASIX) 40 MG tablet Take 40 mg by mouth.     gabapentin (NEURONTIN) 100 MG capsule Take 100 mg by mouth 3 (three) times daily.     metoprolol tartrate (LOPRESSOR) 25 MG tablet Take 0.5 tablets (12.5 mg total) by mouth 2 (two) times daily. (Patient not taking: Reported on 01/12/2021) 30 tablet 0   pramipexole (MIRAPEX) 0.125 MG tablet Take 0.125 mg by mouth daily.     sevelamer carbonate (RENVELA) 800 MG tablet Take 800 mg by mouth 3 (three) times daily with meals.      Labs: Basic Metabolic Panel: Recent Labs  Lab 04/04/21 1245  NA 129*  K 3.5  CL 92*  CO2 25  GLUCOSE 110*  BUN 27*  CREATININE 4.65*  CALCIUM 10.2   Liver Function Tests: Recent Labs  Lab 04/04/21 1245  AST 17  ALT 10  ALKPHOS 84  BILITOT 0.6  PROT 6.8  ALBUMIN 3.1*   CBC: Recent Labs  Lab 04/04/21 1245  WBC 5.4  NEUTROABS 3.8  HGB 8.3*  HCT 27.4*  MCV 115.6*  PLT 165   Studies/Results: MR FOOT LEFT WO CONTRAST  Result Date: 04/04/2021 CLINICAL DATA:  Ulcer on medial left great toe, pain, infection suspected. EXAM: MRI OF THE LEFT  FOOT WITHOUT CONTRAST TECHNIQUE: Multiplanar, multisequence MR imaging of the left forefoot was performed. No intravenous contrast was administered. COMPARISON:  Radiographs dated April 04, 2021 FINDINGS: Multiple sequences are degraded due to motion. Bones/Joint/Cartilage Hyperintense signal on T2 and STIR sequences in the first metatarsal head and base of the proximal phalanx of the first digit with corresponding hypointense signal on T1, which in the presence of adjacent deep skin wound is consistent with acute osteomyelitis. Ligaments Lisfranc ligament is intact. Evaluation of collateral ligaments is somewhat limited due to motion. Muscles and Tendons Flexor, peroneal and extensor compartment tendons are intact. Hyperintense intramuscular signal of the plantar muscles concerning for diabetic myopathy/myositis. No drainable fluid collection. Soft tissue No fluid collection or hematoma. No soft tissue mass. Generalized subcutaneous soft tissue edema about dorsum of the foot. IMPRESSION: 1. Abnormal marrow signal of the head of the first metatarsal and base of the proximal phalanx of the first digit, which in the presence of adjacent deep skin wound is consistent with acute osteomyelitis/septic arthritis. 2. Deep skin wound about the medial aspect of the first metatarsal head. Soft tissue swelling about the dorsum of the foot. Electronically Signed   By: Keane Police D.O.   On: 04/04/2021 15:21    Dialysis Orders:  TTS at [facility] - HBs[facility] 11 on 05/11/20. 3:15hr, EDW 79.5kg, 2K/2.5Ca, AVF, 15g needles, no heparin - Calcitriol 1.41mcg PO q HD - Mircera 217mcg IV q 2 weeks - Venofer 50mg  IV weekly - On Vanc 1g and Cefepime 2g since 2/9. Wound Cx at OP clinic: Finegoldia magna  Assessment/Plan:  R foot osteomyelitis: Admitted for IV abx, repeat Cx, ABIs. Of note, has been on Vanc/Cefepime as outpatient since 03/24/21 (11 days), d/w hospitalist with plan to consult ID and consider debridement.  ESRD:   Continue HD per usual TTS schedule -> HD tomorrow.  Hypertension/volume: BP low sided. UF as tolerated.  Anemia: Hgb 8.3 - unclear when last ESA dose. No IV iron d/t infection.  Metabolic bone disease: Ca 10.2 - hold VDRA for now. Continue Renvela as binder.  Nutrition:  Alb low (3.1) - will add supplements.  A-fib: On Eliquis.  Veneta Penton,  PA-C 04/04/2021, 4:21 PM  [employer]

## 2021-04-04 NOTE — ED Notes (Signed)
Pt in MRI unable to obtain vitals right now

## 2021-04-04 NOTE — Assessment & Plan Note (Addendum)
On dialysis TTS. Nephrology following.  Continue with HD>

## 2021-04-04 NOTE — Assessment & Plan Note (Addendum)
On /eliquis,

## 2021-04-04 NOTE — Assessment & Plan Note (Addendum)
Rate controlled. Holding Cardizem and Metoprolol due to Bradycardia.  Continue with Eliquis.

## 2021-04-04 NOTE — Assessment & Plan Note (Addendum)
POA [name], asked that she be called for medical decisions.  Delirium precautions  B12: 358, started  Supplement.

## 2021-04-04 NOTE — H&P (Addendum)
History and Physical    Patient: Jody [name] WLN:[id number] DOB: 07-08-1940 DOA: 04/04/2021 DOS: the patient was seen and examined on 04/04/2021 PCP: Monico Blitz, MD  Patient coming from:  podiatry clinic  - SNF for rehab: [facility] in [place].   Chief Complaint: left foot infection   HPI: Jody [name] is a 81 y.o. female with medical history significant of  ESRD on HD TTS, T2DM, atrial fibrillation, COPD on chronic oxygen at 2L,  diastolic CHF, anemia in chronic kidney disease, HTN, hx of CVA who presented to ED for concerns for left foot osteomyelitis/PAD.  Sent in from podiatry clinic.   She states she first noticed something on her left foot about 2 weeks ago. It was burning and hurting on her left big toe. She denies any trauma, but thinks her shoe rubbed a blister on it. She saw her PCP last week who told her to soak it in epsom salt,use H2O2 and cover it up. It continued to get worse. He thinks it started to get bigger an look worse with drainage this week. She states she had blood coming out of it.  It is painful and it hurts to bear weight. She went to podiatry today who sent her to ED. She reports not having a wound on her foot before.   Per nephrology PA she has been on IV antibiotics x 2 weeks 03/24/21 (vanc and cefepime) wound culture grew: finegoldia magna   Denies any fever/chills, chest pain or palpitations, shortness of breath, cough at baseline, no stomach pain, no N/V/D, no leg swelling.    ER Course:  vitals: afebrile, bp: 93/63, HR: 72, RR: 16, oxygen: 96% on 3L [place] Pertinent labs: sodium: 129, BUN; 27, creatinine: 4.65, hgb: 8.3, ESR: 35, CRP: 1.2,  MRI left foot: Abnormal marrow signal of the head of the first metatarsal and base of the proximal phalanx of the first digit, which in the presence of adjacent deep skin wound is consistent with acute osteomyelitis/septic arthritis. 2. Deep skin wound about the medial aspect of the first metatarsal head. Soft tissue  swelling about the dorsum of the foot. In ED: nephrology consulted and podiatry. Started on vanc and cefepime. TRH asked to admit.     Review of Systems: As mentioned in the history of present illness. All other systems reviewed and are negative. Past Medical History:  Diagnosis Date   Anemia in chronic kidney disease (CODE) 06/05/2015   Arthritis    "bad in my legs" (12/07/2014)   Chronic atrial fibrillation ([place]) 06/05/2015   Chronic back pain    "dr said my spine is crooked"   Chronic bronchitis ([place])    "get it q yr" (12/07/2014)   CKD stage 4 due to type 2 diabetes mellitus ([place]) 7/40/8144   Complication of anesthesia    headache for 2 days after surgery in May 2019   COPD (chronic obstructive pulmonary disease) ([place])    Gait difficulty 09/02/2014   Hypercholesterolemia    Hypertension    Neuropathy 2015   in legs   NSVT (nonsustained ventricular tachycardia)    Sinus brady-tachy syndrome ([place])    Stroke ([place]) 05/2014   denies residual on 12/07/2014   Type II diabetes mellitus ([place])    Past Surgical History:  Procedure Laterality Date   ABDOMINAL HYSTERECTOMY  ~ [place]  ~ [place] Right 07/06/2017   Procedure: RIGHT RADIOCEPHALIC ARTERIOVENOUS FISTULA creation;  Surgeon: Conrad Laurel Hill, MD;  Location:  [facility] OR;  Service: Vascular;  Laterality: Right;   [place] Right 09/17/2017   Procedure: SECOND STAGE BASILIC VEIN TRANSPOSITION RIGHT ARM;  Surgeon: Rosetta Posner, MD;  Location: [place];  Service: Vascular;  Laterality: Right;   COLONOSCOPY N/A 10/28/2017   Procedure: COLONOSCOPY;  Surgeon: Rogene Houston, MD;  [facility];  Service: Endoscopy;  Laterality: N/A;   COLONOSCOPY WITH PROPOFOL N/A 12/29/2019   Procedure: COLONOSCOPY WITH PROPOFOL;  Surgeon: Eloise Harman, DO;  [facility];  Service: Endoscopy;  Laterality: N/A;   ESOPHAGOGASTRODUODENOSCOPY (EGD) WITH PROPOFOL N/A 10/31/2017   Procedure:  ESOPHAGOGASTRODUODENOSCOPY (EGD) WITH PROPOFOL;  Surgeon: Rogene Houston, MD;  [facility];  Service: Endoscopy;  Laterality: N/A;   JOINT REPLACEMENT     TOTAL KNEE ARTHROPLASTY Right ~ [place]  ~ 1970   "9# in my stomach"   Social History:  reports that she quit smoking about 17 years ago. Her smoking use included cigarettes. She started smoking about 68 years ago. She has a 25.00 pack-year smoking history. She has never used smokeless tobacco. She reports that she does not drink alcohol and does not use drugs.  Allergies  Allergen Reactions   Codeine Nausea And Vomiting    Family History  Problem Relation Age of Onset   Cancer Other    Heart attack Brother    Cancer Sister        breast   Alcohol abuse Brother     Prior to Admission medications   Medication Sig Start Date End Date Taking? Authorizing Provider  albuterol (ACCUNEB) 0.63 MG/3ML nebulizer solution Take 1 ampule by nebulization every 6 (six) hours as needed for wheezing.    [provider]  albuterol (PROVENTIL HFA;VENTOLIN HFA) 108 (90 BASE) MCG/ACT inhaler Inhale 1-2 puffs into the lungs every 6 (six) hours as needed for wheezing or shortness of breath. Reported on 03/01/2015    [provider]  apixaban (ELIQUIS) 2.5 MG TABS tablet Take by mouth daily.    [provider]  diltiazem (CARDIZEM CD) 120 MG 24 hr capsule Take 120 mg by mouth daily. 02/02/21   [provider]  fluticasone-salmeterol (ADVAIR HFA) 230-21 MCG/ACT inhaler Inhale 2 puffs into the lungs 2 (two) times daily.    [provider]  furosemide (LASIX) 40 MG tablet Take 40 mg by mouth.    [provider]  gabapentin (NEURONTIN) 100 MG capsule Take 100 mg by mouth 3 (three) times daily.    [provider]  metoprolol tartrate (LOPRESSOR) 25 MG tablet Take 0.5 tablets (12.5 mg total) by mouth 2 (two) times daily. Patient not taking: Reported on 01/12/2021 10/18/16    Jody Dike, MD  pramipexole (MIRAPEX) 0.125 MG tablet Take 0.125 mg by mouth daily.    [provider]  sevelamer carbonate (RENVELA) 800 MG tablet Take 800 mg by mouth 3 (three) times daily with meals.  07/22/19   [provider]    Physical Exam: Vitals:   04/04/21 1245 04/04/21 1459 04/04/21 1500 04/04/21 1800  BP: (!) 92/51 121/63  113/80  Pulse:  80  68  Resp: 11 18    Temp:      TempSrc:      SpO2:  96%  94%  Weight:   80 kg   Height:   '5\' 7"'  (1.702 m)    General:  Appears calm and comfortable and is in NAD Eyes:  PERRL, EOMI,  normal lids, iris ENT:  grossly normal hearing, lips & tongue, mmm; appropriate dentition Neck:  no LAD, masses or thyromegaly; no carotid bruits Cardiovascular:  irregularly, irregular, no m/r/g. No LE edema.  Respiratory:   CTA bilaterally with no wheezes/rales/rhonchi.  Normal respiratory effort. Abdomen:  soft, NT, ND, NABS Back:   normal alignment, no CVAT Skin:  open wound to left medial hallux surrounding erythema/edema. Has ointment on it, unsure if drainage. Can probe. AVF in RUE with thrill    Musculoskeletal:  grossly normal tone BUE/BLE, good ROM, hands with arthritic changes/contractures. Lower extremity:  No LE edema.  thready distal pulses. Psychiatric:  grossly normal mood and affect, speech fluent and appropriate, AOx3 Neurologic:  CN 2-12 grossly intact, moves all extremities in coordinated fashion, sensation intact   Radiological Exams on Admission: Independently reviewed - see discussion in A/P where applicable  MR FOOT LEFT WO CONTRAST  Result Date: 04/04/2021 CLINICAL DATA:  Ulcer on medial left great toe, pain, infection suspected. EXAM: MRI OF THE LEFT FOOT WITHOUT CONTRAST TECHNIQUE: Multiplanar, multisequence MR imaging of the left forefoot was performed. No intravenous contrast was administered. COMPARISON:  Radiographs dated April 04, 2021 FINDINGS: Multiple sequences are degraded due to motion.  Bones/Joint/Cartilage Hyperintense signal on T2 and STIR sequences in the first metatarsal head and base of the proximal phalanx of the first digit with corresponding hypointense signal on T1, which in the presence of adjacent deep skin wound is consistent with acute osteomyelitis. Ligaments Lisfranc ligament is intact. Evaluation of collateral ligaments is somewhat limited due to motion. Muscles and Tendons Flexor, peroneal and extensor compartment tendons are intact. Hyperintense intramuscular signal of the plantar muscles concerning for diabetic myopathy/myositis. No drainable fluid collection. Soft tissue No fluid collection or hematoma. No soft tissue mass. Generalized subcutaneous soft tissue edema about dorsum of the foot. IMPRESSION: 1. Abnormal marrow signal of the head of the first metatarsal and base of the proximal phalanx of the first digit, which in the presence of adjacent deep skin wound is consistent with acute osteomyelitis/septic arthritis. 2. Deep skin wound about the medial aspect of the first metatarsal head. Soft tissue swelling about the dorsum of the foot. Electronically Signed   By: Keane Police D.O.   On: 04/04/2021 15:21    EKG: Independently reviewed.  Atrial fibrillation with rate 62, RBBB; nonspecific ST changes with no evidence of acute ischemia   Labs on Admission: I have personally reviewed the available labs and imaging studies at the time of the admission.  Pertinent labs:   sodium: 129,  BUN; 27,  creatinine: 4.65,  hgb: 8.3,  ESR: 35,  CRP: 1.2,   Assessment and Plan: * Acute osteomyelitis of left foot ([place])- (present on admission) 81 year old with acute osteomyelitis of left big toe who has failed IV abx outpatient -admit to telemetry -has been receiving IV vancomycin and cefepime at dialysis since 03/24/21. Wound culture grew out finegoldia magna. Added flagyl. Discussed with ID, continue flagyl, vancomycin and cefepime for now. Official consult not done, but  would consult if no surgical definitive treatment.  -vancomycin levels pending  -podiatry following: Dr. Jacqualyn Posey  -blood cultures pending -lactic acid wnl, no other signs of sepsis  -inflammatory markers mild elevation -MRI consistent with acute osteomyelitis/septic arthritis-may need ortho involvement  -ABI has been ordered, after results consult vascular if needed   ESRD on dialysis [facility]) On dialysis TTS, had last full session on Saturday Nephrology consulted by edp No emergent need for dialysis  Appreciate nephrology   Atrial fibrillation ([place])- (present on admission) Rate controlled Continue telemetry On eliquis- last took her AM dose, can't remember time  Will hold in case of procedure  Continue cardizem and metoprolol   Chronic diastolic CHF (congestive heart failure) ([place])- (present on admission) Appears euvolemic Volume control per dialysis. Very minimal urine output at baseline Watch I/O Last echo: 06/2020: EF of 45-50% with mildly reduced LVF. Hypokinesis of the inferoseptal, inferolateral and distal inferior walls . Diastolic parameters indeterminate.  Continue lasix   COPD with chornic respiratory failure on oxygen 2L [place]- (present on admission) On chronic 2L oxygen, continue No signs of exacerbation, continue home inhalers:  albuterol prn   Type 2 diabetes mellitus with nephropathy ([place])- (present on admission) a1c of 5.3 in 02/2021, very well controlled Very sensitive SSI and accuchecks   Essential hypertension- (present on admission) Very well controlled.  Continue cardizem 181m daily and metoprolol  History of CVA (cerebrovascular accident) On /eliquis, holding eliqus in case of procedure   Memory loss Called and talked to her niece, POA [name] Has some memory loss, no medication [name] asked that she be called for medical decisions.  Delirium precautions   Hyponatremia Sodium has slowly been trending downward over the past month Likely secondary  to renal failure Continue to follow   Anemia of chronic kidney failure hgb 8.3 today Baseline 7-8 ESA, but unsure when last dose was No IV iron with infection      Advance Care Planning:   Code Status: Full Code   Consults: nephrology, podiatry, Dr. WJacqualyn Poseyfollowing.   DVT Prophylaxis: SCDs  Family Communication: called her niece: ALahoma Crocker [phone] POA  Severity of Illness: The appropriate patient status for this patient is INPATIENT. Inpatient status is judged to be reasonable and necessary in order to provide the required intensity of service to ensure the patient's safety. The patient's presenting symptoms, physical exam findings, and initial radiographic and laboratory data in the context of their chronic comorbidities is felt to place them at high risk for further clinical deterioration. Furthermore, it is not anticipated that the patient will be medically stable for discharge from the hospital within 2 midnights of admission.   * I certify that at the point of admission it is my clinical judgment that the patient will require inpatient hospital care spanning beyond 2 midnights from the point of admission due to high intensity of service, high risk for further deterioration and high frequency of surveillance required.*  Author: AOrma Flaming MD 04/04/2021 7:16 PM  For on call review [email or web address]

## 2021-04-04 NOTE — Assessment & Plan Note (Addendum)
Hold  cardizem 120mg  daily and holder parameter for metoprolol due to bradycardia

## 2021-04-04 NOTE — Assessment & Plan Note (Addendum)
a1c of 5.3 in 02/2021. SSI

## 2021-04-04 NOTE — ED Provider Notes (Signed)
[facility] EMERGENCY DEPARTMENT Provider Note   CSN: [id number] Arrival date & time: 04/04/21  1119     History  Chief Complaint  Patient presents with   Wound Check    Jody [name] is a 81 y.o. female with a past medical history of hypertension, hyperlipidemia, A-fib, NSTEMI and renal failure on dialysis presenting today due to an ulcer to her left big toe.  Patient was seen by her podiatrist this morning who got an x-ray.  The x-ray was concerning for osteomyelitis.  Podiatry also states that she had decreased pulses bilaterally, known PAD.  She was sent to the emergency department for IV antibiotics, an MRI and further evaluation of her decreased pulses.  Patient denies any fever or chills.  Reports that she has pain in her left foot that kept her up all night.  She reports that she is a Tuesday, Thursday, Saturday dialysis patient.  Has not missed any sessions.   Home Medications Prior to Admission medications   Medication Sig Start Date End Date Taking? Authorizing Provider  albuterol (ACCUNEB) 0.63 MG/3ML nebulizer solution Take 1 ampule by nebulization every 6 (six) hours as needed for wheezing.    [provider]  albuterol (PROVENTIL HFA;VENTOLIN HFA) 108 (90 BASE) MCG/ACT inhaler Inhale 1-2 puffs into the lungs every 6 (six) hours as needed for wheezing or shortness of breath. Reported on 03/01/2015    [provider]  apixaban (ELIQUIS) 2.5 MG TABS tablet Take by mouth daily.    [provider]  diltiazem (CARDIZEM CD) 120 MG 24 hr capsule Take 120 mg by mouth daily. 02/02/21   [provider]  fluticasone-salmeterol (ADVAIR HFA) 230-21 MCG/ACT inhaler Inhale 2 puffs into the lungs 2 (two) times daily.    [provider]  furosemide (LASIX) 40 MG tablet Take 40 mg by mouth.    [provider]  gabapentin (NEURONTIN) 100 MG capsule Take 100 mg by mouth 3 (three) times daily.    [provider]   metoprolol tartrate (LOPRESSOR) 25 MG tablet Take 0.5 tablets (12.5 mg total) by mouth 2 (two) times daily. Patient not taking: Reported on 01/12/2021 10/18/16   Kathie Dike, MD  pramipexole (MIRAPEX) 0.125 MG tablet Take 0.125 mg by mouth daily.    [provider]  sevelamer carbonate (RENVELA) 800 MG tablet Take 800 mg by mouth 3 (three) times daily with meals.  07/22/19   [provider]      Allergies    Codeine    Review of Systems   Review of Systems See HPI  Physical Exam Updated Vital Signs BP (!) 91/55    Pulse (!) 42    Temp 98.1 F (36.7 C) (Oral)    Resp 18    SpO2 97%  Physical Exam Vitals and nursing note reviewed.  Constitutional:      Appearance: Normal appearance.  HENT:     Head: Normocephalic and atraumatic.  Eyes:     General: No scleral icterus.    Conjunctiva/sclera: Conjunctivae normal.  Pulmonary:     Effort: Pulmonary effort is normal. No respiratory distress.  Musculoskeletal:     Comments: Quarter size ulceration to the medial part of the first MTP.  Bone is not visually exposed however is palpable.  No crepitus, wound is weeping clear fluid.  Photo in patient's chart from podiatry.  Extremity warm.  Full range of motion and normal strength of bilateral lower extremities.  Skin:    Findings: No rash.  Neurological:     Mental Status: She is alert.     Sensory: Sensory deficit (Patient with decreased sensation on all toes.  Sensation intact from MTPs down the foot) present.  Psychiatric:        Mood and Affect: Mood normal.    ED Results / Procedures / Treatments   Labs (all labs ordered are listed, but only abnormal results are displayed) Labs Reviewed  COMPREHENSIVE METABOLIC PANEL - Abnormal; Notable for the following components:      Result Value   Sodium 129 (*)    Chloride 92 (*)    Glucose, Bld 110 (*)    BUN 27 (*)    Creatinine, Ser 4.65 (*)    Albumin 3.1 (*)    GFR, Estimated 9 (*)    All other components  within normal limits  CBC WITH DIFFERENTIAL/PLATELET - Abnormal; Notable for the following components:   RBC 2.37 (*)    Hemoglobin 8.3 (*)    HCT 27.4 (*)    MCV 115.6 (*)    MCH 35.0 (*)    RDW 19.0 (*)    All other components within normal limits  SEDIMENTATION RATE - Abnormal; Notable for the following components:   Sed Rate 35 (*)    All other components within normal limits  C-REACTIVE PROTEIN - Abnormal; Notable for the following components:   CRP 1.2 (*)    All other components within normal limits  RESP PANEL BY RT-PCR (FLU A&B, COVID) ARPGX2  CULTURE, BLOOD (ROUTINE X 2)  CULTURE, BLOOD (ROUTINE X 2)  LACTIC ACID, PLASMA  LACTIC ACID, PLASMA    EKG None  Radiology   Procedures Procedures  Patient is in A-fib, rate bounces between 30s to 60s.  Medications Ordered in ED Medications  sodium chloride 0.9 % bolus 250 mL (250 mLs Intravenous New Bag/Given 04/04/21 1321)    ED Course/ Medical Decision Making/ A&P                           Medical Decision Making Amount and/or Complexity of Data Reviewed Labs: ordered. Radiology: ordered.   81 year old female with ESRD, type 2 diabetes, hypertension and PAD presenting due to a left big toe ulcer.  Upon my initial evaluation, patient was in atrial fibrillation, apparently her baseline however her rate was in the 30s to 40s.  She said that she was dizzy when she first arrived however is no longer lightheaded.  Oxygen saturations are 97 on 2 L of oxygen that she reports she wears at baseline.  Initially, pulses were not palpable in bilateral feet.  DP pulses dopplerable, left stronger than right.  Labs reviewed by me.  Pertinent results include: -Hemoglobin of 8.3, appears to be around patient's baseline macrocytic anemia -Hyponatremia to 129, around baseline Creatinine 4.65, GFR 9, consistent with patient's baseline -Inflammatory markers mildly elevated, ESR 35, CRP 1.2  Imaging: I spoke with radiology who said  that patient's MRI would be just as useful without contrast.  Outpatient order has been changed to MRI foot without contrast.  This scan was pending at shift change.  Patient signed out to [place].  He will follow-up on patient's MRI.  I suspect she will be discharged home if MRI is without signs of deep wound infection.  Otherwise, patient will require hospitalization for IV antibiotics with scheduled dialysis tomorrow.   Final Clinical Impression(s) / ED Diagnoses Final diagnoses:  Pain  Ulcer of left  foot with other severity ([place])    Rx / DC Orders Signed out to PA Josh Geiple at shift change.  See his note for further results and disposition.   Darliss Ridgel 04/04/21 1530    Blanchie Dessert, MD 04/04/21 [phone]

## 2021-04-04 NOTE — Assessment & Plan Note (Addendum)
On chronic 2L oxygen, continue No signs of exacerbation. Continue with albuterol prn

## 2021-04-04 NOTE — ED Triage Notes (Signed)
Patient sent to [facility] from [facility] for evaluation of left foot wound, paper sent from [facility] states "evaluation for cancer or osteomyelitis". Patient is alert, oriented, and in no apparent distress at this time.

## 2021-04-04 NOTE — Progress Notes (Signed)
FULL RENAL CONSULT NOTE TO FOLLOW.  SPOKE TO HER OUTPATIENT HD UNIT TODAY:  RN reports they noted the foot wound on 03/24/21 - she had a wound Cx on that day, which later grew Finegoldia magna.  She has been on both Vanc 1g and Cefepime 2g q HD since that time.  Will call pharmacy to inform them of the above.  Veneta Penton, PA-C [employer] Pager [phone]

## 2021-04-04 NOTE — Assessment & Plan Note (Addendum)
Baseline 7-8 No IV iron with infection. On Aranesp.  Received one unit PRBC 2/22. Hb 8.2

## 2021-04-05 ENCOUNTER — Inpatient Hospital Stay (HOSPITAL_COMMUNITY): Payer: Medicare Other

## 2021-04-05 ENCOUNTER — Telehealth: Payer: Self-pay | Admitting: Podiatry

## 2021-04-05 ENCOUNTER — Encounter (HOSPITAL_COMMUNITY): Payer: Self-pay | Admitting: Family Medicine

## 2021-04-05 DIAGNOSIS — M86672 Other chronic osteomyelitis, left ankle and foot: Secondary | ICD-10-CM | POA: Diagnosis not present

## 2021-04-05 DIAGNOSIS — Z992 Dependence on renal dialysis: Secondary | ICD-10-CM | POA: Diagnosis not present

## 2021-04-05 DIAGNOSIS — M86172 Other acute osteomyelitis, left ankle and foot: Secondary | ICD-10-CM

## 2021-04-05 DIAGNOSIS — L0390 Cellulitis, unspecified: Secondary | ICD-10-CM

## 2021-04-05 DIAGNOSIS — N186 End stage renal disease: Secondary | ICD-10-CM | POA: Diagnosis not present

## 2021-04-05 DIAGNOSIS — I70221 Atherosclerosis of native arteries of extremities with rest pain, right leg: Secondary | ICD-10-CM

## 2021-04-05 DIAGNOSIS — E876 Hypokalemia: Secondary | ICD-10-CM

## 2021-04-05 LAB — BASIC METABOLIC PANEL
Anion gap: 8 (ref 5–15)
BUN: 30 mg/dL — ABNORMAL HIGH (ref 8–23)
CO2: 30 mmol/L (ref 22–32)
Calcium: 9.7 mg/dL (ref 8.9–10.3)
Chloride: 91 mmol/L — ABNORMAL LOW (ref 98–111)
Creatinine, Ser: 5.1 mg/dL — ABNORMAL HIGH (ref 0.44–1.00)
GFR, Estimated: 8 mL/min — ABNORMAL LOW (ref >60)
Glucose, Bld: 100 mg/dL — ABNORMAL HIGH (ref 70–99)
Potassium: 3.3 mmol/L — ABNORMAL LOW (ref 3.5–5.1)
Sodium: 129 mmol/L — ABNORMAL LOW (ref 135–145)

## 2021-04-05 LAB — GLUCOSE, CAPILLARY
Glucose-Capillary: 93 mg/dL (ref 70–99)
Glucose-Capillary: 98 mg/dL (ref 70–99)
Glucose-Capillary: 116 mg/dL — ABNORMAL HIGH (ref 70–99)
Glucose-Capillary: 122 mg/dL — ABNORMAL HIGH (ref 70–99)

## 2021-04-05 LAB — CBC
HCT: 23.3 % — ABNORMAL LOW (ref 36.0–46.0)
Hemoglobin: 7.1 g/dL — ABNORMAL LOW (ref 12.0–15.0)
MCH: 34.3 pg — ABNORMAL HIGH (ref 26.0–34.0)
MCHC: 30.5 g/dL (ref 30.0–36.0)
MCV: 112.6 fL — ABNORMAL HIGH (ref 80.0–100.0)
Platelets: 141 10*3/uL — ABNORMAL LOW (ref 150–400)
RBC: 2.07 MIL/uL — ABNORMAL LOW (ref 3.87–5.11)
RDW: 18.5 % — ABNORMAL HIGH (ref 11.5–15.5)
WBC: 3.7 10*3/uL — ABNORMAL LOW (ref 4.0–10.5)
nRBC: 0 % (ref 0.0–0.2)

## 2021-04-05 LAB — HEPATITIS B SURFACE ANTIBODY,QUALITATIVE: Hep B S Ab: REACTIVE — AB

## 2021-04-05 MED ORDER — DARBEPOETIN ALFA 200 MCG/0.4ML IJ SOSY
200.0000 ug | PREFILLED_SYRINGE | INTRAMUSCULAR | Status: DC
  Administered 2021-04-07 – 2021-04-14 (×2): 200 ug via INTRAVENOUS
  Filled 2021-04-05 (×4): qty 0.4

## 2021-04-05 MED ORDER — DARBEPOETIN ALFA 200 MCG/0.4ML IJ SOSY
200.0000 ug | PREFILLED_SYRINGE | INTRAMUSCULAR | Status: DC
  Filled 2021-04-05: qty 0.4

## 2021-04-05 MED ORDER — CHLORHEXIDINE GLUCONATE CLOTH 2 % EX PADS
6.0000 | MEDICATED_PAD | Freq: Every day | CUTANEOUS | Status: DC
  Administered 2021-04-07 – 2021-04-13 (×2): 6 via TOPICAL

## 2021-04-05 MED ORDER — CHLORHEXIDINE GLUCONATE CLOTH 2 % EX PADS
6.0000 | MEDICATED_PAD | Freq: Every day | CUTANEOUS | Status: DC
  Administered 2021-04-05 – 2021-04-14 (×8): 6 via TOPICAL

## 2021-04-05 NOTE — Progress Notes (Signed)
Pt heart rate fluctuating high 40s-mid 50s, Afib rhythm. BP 94/55. Metoprolol 12.5 mg scheduled, per Jody [name] hold dose for now and may administer if needed later. Pt observed coughing while eating during bedside report. Pt and previous nurse reports coughing noted at times prior to eating, however not as severe or consistent. Pt instructed to stop eating, given water to assist with swallowing residual food in mouth. Coughing episode occurred for approximately 5 minutes. Pt encouraged not to eat anything else at this time. Able to take medications without difficulty.

## 2021-04-05 NOTE — Telephone Encounter (Signed)
Patient niece would like Dr. Jacqualyn Posey to giver her a call back after clinic today.  Please advise

## 2021-04-05 NOTE — Progress Notes (Addendum)
[facility] Progress Note   Subjective:  Seen on HD - 1.5L UFG and tolerating. A-fib on monitor, occ bradycardia - following. She denies CP/dyspnea.  Objective Vitals:   04/05/21 0830 04/05/21 0900 04/05/21 0930 04/05/21 1000  BP: (!) 103/48 (!) 108/48 (!) 92/50 (!) 106/55  Pulse: 66 67 70 67  Resp: 17     Temp:      TempSrc:      SpO2:      Weight:      Height:       Physical Exam General: Elderly woman, NAD. Nasal O2 in place. Heart: Irreg irregular, no murmur Lungs: CTA anteriorly Abdomen: soft Extremities: No LE edema; L MTP joint with wound, drier today - improved Dialysis Access: RUE AVF + bruit  Additional Objective Labs: Basic Metabolic Panel: Recent Labs  Lab 04/04/21 1245 04/05/21 0400  NA 129* 129*  K 3.5 3.3*  CL 92* 91*  CO2 25 30  GLUCOSE 110* 100*  BUN 27* 30*  CREATININE 4.65* 5.10*  CALCIUM 10.2 9.7   Liver Function Tests: Recent Labs  Lab 04/04/21 1245  AST 17  ALT 10  ALKPHOS 84  BILITOT 0.6  PROT 6.8  ALBUMIN 3.1*   CBC: Recent Labs  Lab 04/04/21 1245 04/05/21 0400  WBC 5.4 3.7*  NEUTROABS 3.8  --   HGB 8.3* 7.1*  HCT 27.4* 23.3*  MCV 115.6* 112.6*  PLT 165 141*   Blood Culture    Component Value Date/Time   SDES BLOOD BLOOD RIGHT ARM 04/04/2021 1640   SPECREQUEST  04/04/2021 1640    BOTTLES DRAWN AEROBIC ONLY Blood Culture adequate volume   CULT  04/04/2021 1640    NO GROWTH < 24 HOURS Performed at [facility], [place] [address] [place], [place] [address]    REPTSTATUS PENDING 04/04/2021 1640   Studies/Results: MR FOOT LEFT WO CONTRAST  Result Date: 04/04/2021 CLINICAL DATA:  Ulcer on medial left great toe, pain, infection suspected. EXAM: MRI OF THE LEFT FOOT WITHOUT CONTRAST TECHNIQUE: Multiplanar, multisequence MR imaging of the left forefoot was performed. No intravenous contrast was administered. COMPARISON:  Radiographs dated April 04, 2021 FINDINGS: Multiple sequences are degraded due  to motion. Bones/Joint/Cartilage Hyperintense signal on T2 and STIR sequences in the first metatarsal head and base of the proximal phalanx of the first digit with corresponding hypointense signal on T1, which in the presence of adjacent deep skin wound is consistent with acute osteomyelitis. Ligaments Lisfranc ligament is intact. Evaluation of collateral ligaments is somewhat limited due to motion. Muscles and Tendons Flexor, peroneal and extensor compartment tendons are intact. Hyperintense intramuscular signal of the plantar muscles concerning for diabetic myopathy/myositis. No drainable fluid collection. Soft tissue No fluid collection or hematoma. No soft tissue mass. Generalized subcutaneous soft tissue edema about dorsum of the foot. IMPRESSION: 1. Abnormal marrow signal of the head of the first metatarsal and base of the proximal phalanx of the first digit, which in the presence of adjacent deep skin wound is consistent with acute osteomyelitis/septic arthritis. 2. Deep skin wound about the medial aspect of the first metatarsal head. Soft tissue swelling about the dorsum of the foot. Electronically Signed   By: Keane Police D.O.   On: 04/04/2021 15:21    Medications:  sodium chloride     sodium chloride     sodium chloride     ceFEPime (MAXIPIME) IV     metronidazole 500 mg (04/05/21 0426)   vancomycin 1,000 mg (04/05/21 0936)    (  feeding supplement) PROSource Plus  30 mL Oral BID BM   Chlorhexidine Gluconate Cloth  6 each Topical Daily   Chlorhexidine Gluconate Cloth  6 each Topical Daily   diltiazem  120 mg Oral Daily   ferrous sulfate  325 mg Oral BID WC   furosemide  40 mg Oral Daily   gabapentin  100 mg Oral TID   insulin aspart  0-6 Units Subcutaneous TID WC   metoprolol tartrate  12.5 mg Oral BID   pramipexole  0.125 mg Oral Daily   sevelamer carbonate  800 mg Oral TID WC   sodium chloride flush  3 mL Intravenous Q12H    Dialysis Orders: TTS at [facility] - HBsAb 11 on  05/11/20. 3:15hr, EDW 79.5kg, 2K/2.5Ca, AVF, 15g needles, no heparin - Calcitriol 1.12mcg PO q HD - Mircera 222mcg IV q 2 weeks - Venofer 50mg  IV weekly - On Vanc 1g and Cefepime 2g since 2/9. Wound Cx at OP clinic: Finegoldia magna   Assessment/Plan:  L foot osteomyelitis: Admitted for IV abx (Vanc/Cefepime/Flagyl), repeat Cx, ABIs. Of note, has been on Vanc/Cefepime as outpatient since 03/24/21 (11 days), d/w hospitalist.  ESRD:  Continue HD per usual TTS schedule -> HD today.  Hypertension/volume: BP low sided. UF as tolerated.  Anemia: Hgb 8.3 -> 7.1 today. No IV iron d/t infection. Ordered Aranesp today and will get FOBT.  Metabolic bone disease: Ca 10.2 - hold VDRA for now. Continue Renvela as binder.  Nutrition:  Alb low (3.1) - continue supplements.  Veneta Penton, PA-C 04/05/2021, 10:07 AM  [employer]

## 2021-04-05 NOTE — Progress Notes (Signed)
Pt receives out-pt HD at [name] on TTS. Pt arrives between 9:30-9:45 for 10:00 chair time. Will assist as needed.   Melven Sartorius Renal Navigator [phone]

## 2021-04-05 NOTE — Hospital Course (Addendum)
81 year old past medical history significant for ESRD on hemodialysis TTS, diabetes type 2, A-fib, COPD on chronic 2 L of oxygen, diastolic heart failure, anemia of chronic kidney disease, hypertension, history of CVA who presents to the ED with concern of left foot osteomyelitis/PAD.  She was sent from podiatry clinic.  Per patient she has been having left foot problem for about 2 weeks.  Patient has been getting IV antibiotics for the last 2 weeks with hemodialysis as an outpatient.  Culture grew Finegoldia magna.   MRI of the left foot: Show abnormal marrow signal of the head of the first metatarsal and base of the proximal phalanx of the first digit, which in the presence of adjacent deep skin wound is consistent with acute osteomyelitis/septic arthritis.  Deep skin wound about the medial aspect of the first metatarsal head.  Underwent arteriogram with SFA drug coated balloon angioplasty and unsuccessful recannulization of the ATA.  Underwent Left first Toe amputation 2/24.  Pathology: Gangrenous cutaneous ulcer with underlying gangrenous cellulitis  extending to bone showing marked acute osteomyelitis  Acute osteomyelitis present within bone margin and separate portion of  bone skin and soft tissue margin grossly viable. ID consulted. Recommending 4 weeks of Cefepime with HD>   AMS; at times she is more alert per nurse. Suspect delirium. Stop gabapentin.PCO2 mildly elevated. Started  BIPAP at HS or with naps. CT head negative. She didn't use BIPAP last night.   She is alert, conversant, following command, Delirium Improved. She is stable to be transfer to SNF>

## 2021-04-05 NOTE — Assessment & Plan Note (Signed)
Stable on 2 L oxygen.

## 2021-04-05 NOTE — Progress Notes (Signed)
SLP Cancellation Note  Patient Details [name] MRN: [id number] DOB: 11-Jan-1941   Cancelled treatment:       Reason Eval/Treat Not Completed: Patient at procedure or test/unavailable (HD). Will f/u as able.     Osie Bond., M.A. [facility] Pager [phone] Office [phone]  04/05/2021, 9:04 AM

## 2021-04-05 NOTE — Consult Note (Signed)
Hospital Consult    Reason for Consult:  left foot osteomyelitis Referring Physician:  Dr. Tyrell Antonio MRN #:  [id number]  History of Present Illness  81 y.o. female with esrd here with osteo of left 1st toe.  She is on dialysis via right upper arm AV fistula.  She states that she has had knee replacement surgery in the past no other lower extremity surgeries.  She denies any history of stenting.  She does have hypertension and hypercholesterolemia as well as type 2 diabetes.  She is on Eliquis at home for history of stroke but this has been held.  Per family patient has wound on her left great toe for 2 months.  This has been progressively worsening.  She is currently residing in a nursing home.  She is able to stand but cannot walk.  Past Medical History:  Diagnosis Date   Anemia in chronic kidney disease (CODE) 06/05/2015   Arthritis    "bad in my legs" (12/07/2014)   Chronic atrial fibrillation ([place]) 06/05/2015   Chronic back pain    "dr said my spine is crooked"   Chronic bronchitis ([place])    "get it q yr" (12/07/2014)   CKD stage 4 due to type 2 diabetes mellitus ([place]) 6/54/6503   Complication of anesthesia    headache for 2 days after surgery in May 2019   COPD (chronic obstructive pulmonary disease) ([place])    Gait difficulty 09/02/2014   Hypercholesterolemia    Hypertension    Neuropathy 2015   in legs   NSVT (nonsustained ventricular tachycardia)    Sinus brady-tachy syndrome ([place])    Stroke ([place]) 05/2014   denies residual on 12/07/2014   Type II diabetes mellitus ([place])     Past Surgical History:  Procedure Laterality Date   ABDOMINAL HYSTERECTOMY  ~ [place]  ~ [place] Right 07/06/2017   Procedure: RIGHT RADIOCEPHALIC ARTERIOVENOUS FISTULA creation;  Surgeon: Conrad Austin, MD;  Location: [place];  Service: Vascular;  Laterality: Right;   [place] Right 09/17/2017   Procedure: SECOND STAGE BASILIC VEIN TRANSPOSITION RIGHT ARM;   Surgeon: Rosetta Posner, MD;  [facility];  Service: Vascular;  Laterality: Right;   COLONOSCOPY N/A 10/28/2017   Procedure: COLONOSCOPY;  Surgeon: Rogene Houston, MD;  [facility];  Service: Endoscopy;  Laterality: N/A;   COLONOSCOPY WITH PROPOFOL N/A 12/29/2019   Procedure: COLONOSCOPY WITH PROPOFOL;  Surgeon: Eloise Harman, DO;  [facility];  Service: Endoscopy;  Laterality: N/A;   ESOPHAGOGASTRODUODENOSCOPY (EGD) WITH PROPOFOL N/A 10/31/2017   Procedure: ESOPHAGOGASTRODUODENOSCOPY (EGD) WITH PROPOFOL;  Surgeon: Rogene Houston, MD;  [facility];  Service: Endoscopy;  Laterality: N/A;   JOINT REPLACEMENT     TOTAL KNEE ARTHROPLASTY Right ~ [place]  ~ 1970   "9# in my stomach"    Allergies  Allergen Reactions   Codeine Nausea And Vomiting    Prior to Admission medications   Medication Sig Start Date End Date Taking? Authorizing Provider  albuterol (ACCUNEB) 0.63 MG/3ML nebulizer solution Take 1 ampule by nebulization every 6 (six) hours as needed for wheezing.   Yes [provider]  albuterol (PROVENTIL HFA;VENTOLIN HFA) 108 (90 BASE) MCG/ACT inhaler Inhale 1-2 puffs into the lungs every 6 (six) hours as needed for wheezing or shortness of breath. Reported on 03/01/2015   Yes [provider]  apixaban (ELIQUIS) 2.5 MG TABS tablet  Take 2.5 mg by mouth 2 (two) times daily.   Yes [provider]  diltiazem (CARDIZEM CD) 120 MG 24 hr capsule Take 120 mg by mouth daily. 02/02/21  Yes [provider]  ferrous sulfate 325 (65 FE) MG tablet Take 325 mg by mouth 2 (two) times daily with a meal.   Yes [provider]  furosemide (LASIX) 40 MG tablet Take 40 mg by mouth.   Yes [provider]  gabapentin (NEURONTIN) 100 MG capsule Take 100 mg by mouth 3 (three) times daily.   Yes [provider]  metoprolol tartrate (LOPRESSOR) 25 MG tablet Take 0.5 tablets (12.5 mg total) by  mouth 2 (two) times daily. 10/18/16  Yes Kathie Dike, MD  pramipexole (MIRAPEX) 0.125 MG tablet Take 0.125 mg by mouth daily.   Yes [provider]  sevelamer carbonate (RENVELA) 800 MG tablet Take 800 mg by mouth 3 (three) times daily with meals.  07/22/19  Yes [provider]    Social History   Socioeconomic History   Marital status: Widowed    Spouse name: Not on file   Number of children: 0   Years of education: 9   Highest education level: Not on file  Occupational History   Occupation: retired  Tobacco Use   Smoking status: Former    Packs/day: 0.50    Years: 50.00    Pack years: 25.00    Types: Cigarettes    Start date: 03/07/1953    Quit date: 01/14/2004    Years since quitting: 17.2   Smokeless tobacco: Never   Tobacco comments:    "quit smoking cigarettes  in ~ 2005"  Vaping Use   Vaping Use: Never used  Substance and Sexual Activity   Alcohol use: No    Alcohol/week: 0.0 standard drinks    Comment: 10/242016 "quit drinking beer in ~ 1977"   Drug use: No   Sexual activity: Never  Other Topics Concern   Not on file  Social History Narrative   Patient drinks caffeine a few times a week.   Patient is right handed.   Social Determinants of Health   Financial Resource Strain: Not on file  Food Insecurity: Not on file  Transportation Needs: Not on file  Physical Activity: Not on file  Stress: Not on file  Social Connections: Not on file  Intimate Partner Violence: Not on file    Family History  Problem Relation Age of Onset   Cancer Other    Heart attack Brother    Cancer Sister        breast   Alcohol abuse Brother     Review of Systems  Constitutional: Negative.   HENT: Negative.    Eyes: Negative.   Respiratory: Negative.    Cardiovascular: Negative.   Gastrointestinal: Negative.   Musculoskeletal:        Left foot wound  Skin: Negative.   Neurological: Negative.   Endo/Heme/Allergies: Negative.   Psychiatric/Behavioral:  Negative.       Physical Examination  Vitals:   04/05/21 1052 04/05/21 1157  BP: (!) 109/53 (!) 108/52  Pulse: 60 66  Resp: (!) 21 18  Temp: (!) 97.4 F (36.3 C) (!) 97.5 F (36.4 C)  SpO2: 98% 98%   Body mass index is 28.11 kg/m.  Physical Exam Constitutional:      Appearance: Normal appearance.  HENT:     Head: Normocephalic.     Nose: Nose normal.  Eyes:     Pupils:  Pupils are equal, round, and reactive to light.  Cardiovascular:     Rate and Rhythm: Normal rate.  Pulmonary:     Effort: Pulmonary effort is normal.  Abdominal:     General: Abdomen is flat.     Palpations: Abdomen is soft. There is no mass.  Musculoskeletal:     Comments: Right upper arm thrill  Skin:    General: Skin is warm and dry.     Capillary Refill: Capillary refill takes less than 2 seconds.  Neurological:     General: No focal deficit present.     Mental Status: She is alert.  Psychiatric:        Mood and Affect: Mood normal.        Behavior: Behavior normal.        Thought Content: Thought content normal.        Judgment: Judgment normal.      CBC    Component Value Date/Time   WBC 3.7 (L) 04/05/2021 0400   RBC 2.07 (L) 04/05/2021 0400   HGB 7.1 (L) 04/05/2021 0400   HCT 23.3 (L) 04/05/2021 0400   PLT 141 (L) 04/05/2021 0400   MCV 112.6 (H) 04/05/2021 0400   MCH 34.3 (H) 04/05/2021 0400   MCHC 30.5 04/05/2021 0400   RDW 18.5 (H) 04/05/2021 0400   LYMPHSABS 0.9 04/04/2021 1245   MONOABS 0.6 04/04/2021 1245   EOSABS 0.1 04/04/2021 1245   BASOSABS 0.1 04/04/2021 1245    BMET    Component Value Date/Time   NA 129 (L) 04/05/2021 0400   K 3.3 (L) 04/05/2021 0400   CL 91 (L) 04/05/2021 0400   CO2 30 04/05/2021 0400   GLUCOSE 100 (H) 04/05/2021 0400   BUN 30 (H) 04/05/2021 0400   CREATININE 5.10 (H) 04/05/2021 0400   CALCIUM 9.7 04/05/2021 0400   GFRNONAA 8 (L) 04/05/2021 0400   GFRAA 9 (L) 10/27/2019 0112    COAGS: Lab Results  Component Value Date   INR 1.0  12/27/2019   INR 1.2 10/27/2019   INR 1.09 09/03/2017     Non-Invasive Vascular Imaging:   ABI Findings:  +---------+------------------+-----+----------+--------+   Right     Rt Pressure (mmHg) Index Waveform   Comment    +---------+------------------+-----+----------+--------+   Brachial                                      DIA        +---------+------------------+-----+----------+--------+   PTA       190                1.74  monophasic            +---------+------------------+-----+----------+--------+   DP                                 monophasic [place]         +---------+------------------+-----+----------+--------+   Great Toe 55                 0.50  Abnormal              +---------+------------------+-----+----------+--------+   +---------+------------------+-----+-------------------+-------+   Left      Lt Pressure (mmHg) Index Waveform            Comment   +---------+------------------+-----+-------------------+-------+   Brachial  109  biphasic                      +---------+------------------+-----+-------------------+-------+   PTA                                dampened monophasic [place]        +---------+------------------+-----+-------------------+-------+   DP                                 monophasic          [place]        +---------+------------------+-----+-------------------+-------+   Great Toe 27                 0.25  Abnormal                      +---------+------------------+-----+-------------------+-------+   +-------+-----------+-----------+------------+------------+   ABI/TBI Today's ABI Today's TBI Previous ABI Previous TBI   +-------+-----------+-----------+------------+------------+   Right   [place]          0.50        0.83         0.70           +-------+-----------+-----------+------------+------------+   Left    [place]          0.25        0.79         0.31           +-------+-----------+-----------+------------+------------+       Arterial wall calcification precludes accurate ankle pressures and ABIs.     Summary:  Right: Resting right ankle-brachial index indicates noncompressible right  lower extremity arteries. The right toe-brachial index is abnormal  (moderate).   Left: Resting left ankle-brachial index indicates noncompressible left  lower extremity arteries. The left toe-brachial index is abnormal  (severe).    MRI Left foot IMPRESSION: 1. Abnormal marrow signal of the head of the first metatarsal and base of the proximal phalanx of the first digit, which in the presence of adjacent deep skin wound is consistent with acute osteomyelitis/septic arthritis. 2. Deep skin wound about the medial aspect of the first metatarsal head. Soft tissue swelling about the dorsum of the foot.   ASSESSMENT/PLAN: This is a 81 y.o. female with wound on first toe the left side plan for amputation with podiatry.  We will plan for angiography tomorrow from right common femoral approach.  I discussed risk benefits alternatives with the patient and her family they demonstrate good understanding.  She will be n.p.o. past midnight.  Continue to hold Eliquis.  I communicated the plan to Dr. Jacqualyn Posey with podiatry.  Ferrel Simington C. Donzetta Matters, MD [facility] Office: [phone] Pager: [phone]

## 2021-04-05 NOTE — Telephone Encounter (Signed)
Received call from [place] nurse consult for pt. She is @ [facility] room 30. She is currently in dialysis.

## 2021-04-05 NOTE — Progress Notes (Signed)
Progress Note   Patient: Jody [name] QIH:[id number] DOB: 12-07-1940 DOA: 04/04/2021     1 DOS: the patient was seen and examined on 04/05/2021   Brief hospital course: 81 year old past medical history significant for ESRD on hemodialysis TTS, diabetes type 2, A-fib, COPD on chronic 2 L of oxygen, diastolic heart failure, anemia of chronic kidney disease, hypertension, history of CVA who presents to the ED with concern of left foot osteomyelitis/PAD.  She was sent from podiatry clinic.  Per patient she has been having left foot problem for about 2 weeks.  Patient has been getting IV antibiotics for the last 2 weeks with hemodialysis as an outpatient.  Culture grew Finegoldia magna.   MRI of the left foot: Show abnormal marrow signal of the head of the first metatarsal and base of the proximal phalanx of the first digit, which in the presence of adjacent deep skin wound is consistent with acute osteomyelitis/septic arthritis.  Deep skin wound about the medial aspect of the first metatarsal head.    Assessment and Plan: * Acute osteomyelitis of left foot ([place])- (present on admission) Presents with Acute osteomyelitis of left big toe who has failed IV abx outpatient -Has been receiving IV vancomycin and cefepime at dialysis since 03/24/21. Wound culture grew out finegoldia magna. -Continue with Vancomycin, Cefepime and flagyl.  -Dr Jacqualyn Posey recommend Partial first ray amputation.  -ABI: Right and left resting right ankle-brachial index indicate noncompressible right lower extremity arteries.  Right and left toe brachial index is abnormal.  We will consult vascular  Hypokalemia Correction with HD.   Memory loss [place], asked that she be called for medical decisions.  Delirium precautions  Check B12  Hyponatremia Likely secondary to renal failure Continue to follow   Anemia of chronic kidney failure Hb at 7.1 Baseline 7-8 No IV iron with infection  On Ar[place].   Chronic  respiratory failure (HCC) Stable on 2 L oxygen.   History of CVA (cerebrovascular accident) On /eliquis, holding eliqus in case of procedure   ESRD on dialysis [facility]) On dialysis TTS. Nephrology following.  Had HD today.   Type 2 diabetes mellitus with nephropathy ([place])- (present on admission) a1c of 5.3 in 02/2021. SSI and accuchecks   Chronic diastolic CHF (congestive heart failure) ([place])- (present on admission) Last echo: 06/2020: EF of 45-50% with mildly reduced LVF. Hypokinesis of the inferoseptal, inferolateral and distal inferior walls . Diastolic parameters indeterminate.  Volume management with HD  Discontinue lasix.   Atrial fibrillation ([place])- (present on admission) Rate controlled Hold Eliquis  in case of procedure  Continue cardizem and metoprolol , Holder parameter for BP.   COPD with chornic respiratory failure on oxygen 2L [place]- (present on admission) On chronic 2L oxygen, continue No signs of exacerbation. Continue with albuterol prn   Essential hypertension- (present on admission) Continue cardizem 120mg  daily and metoprolol BP soft, holding cardizem, metoprolol  morning dose after HD        Subjective: she denies dyspnea, cough, chest pain.   Physical Exam: Vitals:   04/05/21 1000 04/05/21 1030 04/05/21 1052 04/05/21 1157  BP: (!) 106/55 (!) 110/54 (!) 109/53 (!) 108/52  Pulse: 67 66 60 66  Resp: 13 12 (!) 21 18  Temp:   (!) 97.4 F (36.3 C) (!) 97.5 F (36.4 C)  TempSrc:   Temporal Oral  SpO2:   98% 98%  Weight:   81.4 kg   Height:       General: NAD CVS; S 1,  S 2 RRR Lungs; CTA Extremity foot open wound  Data Reviewed:  CBC, Bmet   Family Communication:   Disposition: Status is: Inpatient Remains inpatient appropriate because: treatment of Osteomyelitis.           Planned Discharge Destination:  to be determine.      Time spent: 45 minutes  Author: Elmarie Shiley, MD 04/05/2021 3:39 PM  For on call review  [email or web address].

## 2021-04-05 NOTE — Assessment & Plan Note (Signed)
Correction with HD.

## 2021-04-05 NOTE — Plan of Care (Signed)
°  Problem: Education: Goal: Knowledge of disease or condition will improve Outcome: Progressing   Problem: Education: Goal: Knowledge of General Education information will improve Description: Including pain rating scale, medication(s)/side effects and non-pharmacologic comfort measures Outcome: Progressing   Problem: Activity: Goal: Risk for activity intolerance will decrease Outcome: Progressing   Problem: Pain Managment: Goal: General experience of comfort will improve Outcome: Progressing   Problem: Safety: Goal: Ability to remain free from injury will improve Outcome: Progressing   Problem: Skin Integrity: Goal: Risk for impaired skin integrity will decrease Outcome: Progressing   

## 2021-04-05 NOTE — Progress Notes (Signed)
ABI has been completed.  Results can be found under chart review under CV PROC. 04/05/2021 3:15 PM Lorien Shingler RVT, RDMS

## 2021-04-05 NOTE — Evaluation (Signed)
Clinical/Bedside Swallow Evaluation Patient Details  [name] MRN: [id number] Date of Birth: [birth year]  Today's Date: 04/05/2021 Time: SLP Start Time (ACUTE ONLY): 50 SLP Stop Time (ACUTE ONLY): 1401 SLP Time Calculation (min) (ACUTE ONLY): 27 min  Past Medical History:  Past Medical History:  Diagnosis Date   Anemia in chronic kidney disease (CODE) 06/05/2015   Arthritis    "bad in my legs" (12/07/2014)   Chronic atrial fibrillation (Jody [name]) 06/05/2015   Chronic back pain    "dr said my spine is crooked"   Chronic bronchitis (Jody [name])    "get it q yr" (12/07/2014)   CKD stage 4 due to type 2 diabetes mellitus (Jody [name]) 04/06/9796   Complication of anesthesia    headache for 2 days after surgery in May 2019   COPD (chronic obstructive pulmonary disease) ([place])    Gait difficulty 09/02/2014   Hypercholesterolemia    Hypertension    Neuropathy 2015   in legs   NSVT (nonsustained ventricular tachycardia)    Sinus brady-tachy syndrome (Jody [name])    Stroke (Jody [name]) 05/2014   denies residual on 12/07/2014   Type II diabetes mellitus (Jody [name])    Past Surgical History:  Past Surgical History:  Procedure Laterality Date   ABDOMINAL HYSTERECTOMY  ~ [place]  ~ [place] Right 07/06/2017   Procedure: RIGHT RADIOCEPHALIC ARTERIOVENOUS FISTULA creation;  Surgeon: Conrad [place], MD;  Location: [place];  Service: Vascular;  Laterality: Right;   [place] Right 09/17/2017   Procedure: SECOND STAGE BASILIC VEIN TRANSPOSITION RIGHT ARM;  Surgeon: Rosetta Posner, MD;  Location: [place];  Service: Vascular;  Laterality: Right;   COLONOSCOPY N/A 10/28/2017   Procedure: COLONOSCOPY;  Surgeon: Rogene Houston, MD;  [facility];  Service: Endoscopy;  Laterality: N/A;   COLONOSCOPY WITH PROPOFOL N/A 12/29/2019   Procedure: COLONOSCOPY WITH PROPOFOL;  Surgeon: Eloise Harman, DO;  [facility];  Service: Endoscopy;  Laterality: N/A;    ESOPHAGOGASTRODUODENOSCOPY (EGD) WITH PROPOFOL N/A 10/31/2017   Procedure: ESOPHAGOGASTRODUODENOSCOPY (EGD) WITH PROPOFOL;  Surgeon: Rogene [place], MD;  [facility];  Service: Endoscopy;  Laterality: N/A;   JOINT REPLACEMENT     TOTAL KNEE ARTHROPLASTY Right ~ Jody [name]  ~ 1970   "9# in my stomach"   HPI:  Pt is an 81 yo female presenting with concerns for L foot osteomyelitis/PAD. Swallow eval was ordered after pt was observed to be coughing with PO intake on 2/20. Previous swallow eval in April 2016 revealed R sided weakness with Dys 3 diet and thin liquids recommended as pt was also missing her dentures at the time. PMH also includes: ESRD on HD TTS, T2DM, atrial fibrillation, COPD on chronic oxygen at 2L,  diastolic CHF, anemia in CKD, HTN, CVA, esophageal dysmotility per esophagram in 2019    Assessment / Plan / Recommendation  Clinical Impression  Pt and her niece report that pt has been having a lot of trouble eating solids since her lower dentures went missing at her SNF. They are currently in the process of working with the facility to try to have a replacement made, but in the meantime, they have been chopping her food up finely. This is also consistent with nursing report per chart and conversation with RN today, who describe coughing with solid foods, requiring thin liquids to clear her oral cavity. SLP provided different consistencies including thin liquids, purees, and solids  that were softened in very small pieces. With these textures, swallowing appears to be functional. Recommend adjusting diet to Dys 2 solids, thin liquids. SLP will f/u briefly for tolerance, but if this diet better meets her needs while she is awaiting new dentures, then she may not need much more f/u from SLP. SLP Visit Diagnosis: Dysphagia, unspecified (R13.10)    Aspiration Risk  Mild aspiration risk    Diet Recommendation Dysphagia 2 (Fine chop);Thin liquid   Liquid Administration  via: Cup;Straw Medication Administration: Whole meds with liquid Supervision: Staff to assist with self feeding Compensations: Minimize environmental distractions;Slow rate;Small sips/bites Postural Changes: Seated upright at 90 degrees;Remain upright for at least 30 minutes after po intake    Other  Recommendations Oral Care Recommendations: Oral care BID    Recommendations for follow up therapy are one component of a multi-disciplinary discharge planning process, led by the attending physician.  Recommendations may be updated based on patient status, additional functional criteria and insurance authorization.  Follow up Recommendations Skilled nursing-short term rehab (<3 hours/day)      Assistance Recommended at Discharge Intermittent Supervision/Assistance  Functional Status Assessment Patient has not had a recent decline in their functional status  Frequency and Duration min 1 x/week  1 week       Prognosis Prognosis for Safe Diet Advancement: Good (with access to her dentures)      Swallow Study   General HPI: Pt is an 81 yo female presenting with concerns for L foot osteomyelitis/PAD. Swallow eval was ordered after pt was observed to be coughing with PO intake on 2/20. Previous swallow eval in April 2016 revealed R sided weakness with Dys 3 diet and thin liquids recommended as pt was also missing her dentures at the time. PMH also includes: ESRD on HD TTS, T2DM, atrial fibrillation, COPD on chronic oxygen at 2L,  diastolic CHF, anemia in CKD, HTN, CVA, esophageal dysmotility per esophagram in 2019 Type of Study: Bedside Swallow Evaluation Previous Swallow Assessment: see HPI Diet Prior to this Study: Regular;Thin liquids Temperature Spikes Noted: No Respiratory Status: Nasal cannula History of Recent Intubation: No Behavior/Cognition: Alert;Cooperative Oral Cavity Assessment: Within Functional Limits Oral Care Completed by SLP: No Oral Cavity - Dentition: Dentures,  top;Missing dentition (bottom dentures went missing at SNF per niece) Vision: Functional for self-feeding Patient Positioning: Upright in bed Baseline Vocal Quality: Normal Volitional Cough: Strong Volitional Swallow: Able to elicit    Oral/Motor/Sensory Function Overall Oral Motor/Sensory Function: Within functional limits (for the commands that she followed)   Ice Chips Ice chips: Not tested   Thin Liquid Thin Liquid: Within functional limits Presentation: Self Fed;Straw    Nectar Thick Nectar Thick Liquid: Not tested   Honey Thick Honey Thick Liquid: Not tested   Puree Puree: Within functional limits Presentation: Spoon   Solid     Solid: Within functional limits      Osie Bond., M.A. [place] Pager [phone] Office [phone]  04/05/2021,2:36 PM

## 2021-04-05 NOTE — Consult Note (Signed)
Reason for Consult:Osteomyelitis Referring Physician: Dr. Niel Hummer, MD  Jody [name] is an 81 y.o. female.  HPI: 81 year-old female was seen in clinic yesterday and found to have wound and concern for osteomyelitis and PAD and she was sent to the hospital for evaluation.  Upon evaluation MRI was performed which does reveal osteomyelitis.  Arterial studies are still pending.  She states that she is feeling well no fevers or chills currently.  No acute changes since yesterday.  Past Medical History:  Diagnosis Date   Anemia in chronic kidney disease (CODE) 06/05/2015   Arthritis    "bad in my legs" (12/07/2014)   Chronic atrial fibrillation ([place]) 06/05/2015   Chronic back pain    "dr said my spine is crooked"   Chronic bronchitis ([place])    "get it q yr" (12/07/2014)   CKD stage 4 due to type 2 diabetes mellitus ([place]) 07/30/735   Complication of anesthesia    headache for 2 days after surgery in May 2019   COPD (chronic obstructive pulmonary disease) ([place])    Gait difficulty 09/02/2014   Hypercholesterolemia    Hypertension    Neuropathy 2015   in legs   NSVT (nonsustained ventricular tachycardia)    Sinus brady-tachy syndrome ([place])    Stroke ([place]) 05/2014   denies residual on 12/07/2014   Type II diabetes mellitus ([place])     Past Surgical History:  Procedure Laterality Date   ABDOMINAL HYSTERECTOMY  ~ [place]  ~ [place] Right 07/06/2017   Procedure: RIGHT RADIOCEPHALIC ARTERIOVENOUS FISTULA creation;  Surgeon: Conrad Se[place]ville, MD;  Location: [facility];  Service: Vascular;  Laterality: Right;   [place] Right 09/17/2017   Procedure: SECOND STAGE BASILIC VEIN TRANSPOSITION RIGHT ARM;  Surgeon: Rosetta Posner, MD;  Location: [facility] OR;  Service: Vascular;  Laterality: Right;   COLONOSCOPY N/A 10/28/2017   Procedure: COLONOSCOPY;  Surgeon: Rogene Houston, MD;  [facility];  Service: Endoscopy;  Laterality: N/A;   COLONOSCOPY  WITH PROPOFOL N/A 12/29/2019   Procedure: COLONOSCOPY WITH PROPOFOL;  Surgeon: Eloise Harman, DO;  [facility];  Service: Endoscopy;  Laterality: N/A;   ESOPHAGOGASTRODUODENOSCOPY (EGD) WITH PROPOFOL N/A 10/31/2017   Procedure: ESOPHAGOGASTRODUODENOSCOPY (EGD) WITH PROPOFOL;  Surgeon: Rogene Houston, MD;  [facility];  Service: Endoscopy;  Laterality: N/A;   JOINT REPLACEMENT     TOTAL KNEE ARTHROPLASTY Right ~ [place]  ~ 1970   "9# in my stomach"    Family History  Problem Relation Age of Onset   Cancer Other    Heart attack Brother    Cancer Sister        breast   Alcohol abuse Brother     Social History:  reports that she quit smoking about 17 years ago. Her smoking use included cigarettes. She started smoking about 68 years ago. She has a 25.00 pack-year smoking history. She has never used smokeless tobacco. She reports that she does not drink alcohol and does not use drugs.  Allergies:  Allergies  Allergen Reactions   Codeine Nausea And Vomiting    Medications: I have reviewed the patient's current medications.  Results for orders placed or performed during the hospital encounter of 04/04/21 (from the past 48 hour(s))  Lactic acid, plasma     Status: None   Collection Time: 04/04/21 12:45 PM  Result Value Ref Range   Lactic Acid,  Venous 1.6 0.5 - 1.9 mmol/L    Comment: Performed at [facility], [place] [address] [place], [place] [address]  Comprehensive metabolic panel     Status: Abnormal   Collection Time: 04/04/21 12:45 PM  Result Value Ref Range   Sodium 129 (L) 135 - 145 mmol/L   Potassium 3.5 3.5 - 5.1 mmol/L   Chloride 92 (L) 98 - 111 mmol/L   CO2 25 22 - 32 mmol/L   Glucose, Bld 110 (H) 70 - 99 mg/dL    Comment: Glucose reference range applies only to samples taken after fasting for at least 8 hours.   BUN 27 (H) 8 - 23 mg/dL   Creatinine, Ser 4.65 (H) 0.44 - 1.00 mg/dL   Calcium 10.2 8.9 - 10.3 mg/dL   Total  Protein 6.8 6.5 - 8.1 g/dL   Albumin 3.1 (L) 3.5 - 5.0 g/dL   AST 17 15 - 41 U/L   ALT 10 0 - 44 U/L   Alkaline Phosphatase 84 38 - 126 U/L   Total Bilirubin 0.6 0.3 - 1.2 mg/dL   GFR, Estimated 9 (L) >60 mL/min    Comment: (NOTE) Calculated using the CKD-EPI Creatinine Equation (2021)    Anion gap 12 5 - 15    Comment: Performed at [facility] [address], [place], [place] [address]  CBC with Differential     Status: Abnormal   Collection Time: 04/04/21 12:45 PM  Result Value Ref Range   WBC 5.4 4.0 - 10.5 K/uL   RBC 2.37 (L) 3.87 - 5.11 MIL/uL   Hemoglobin 8.3 (L) 12.0 - 15.0 g/dL   HCT 27.4 (L) 36.0 - 46.0 %   MCV 115.6 (H) 80.0 - 100.0 fL   MCH 35.0 (H) 26.0 - 34.0 pg   MCHC 30.3 30.0 - 36.0 g/dL   RDW 19.0 (H) 11.5 - 15.5 %   Platelets 165 150 - 400 K/uL   nRBC 0.0 0.0 - 0.2 %   Neutrophils Relative % 70 %   Neutro Abs 3.8 1.7 - 7.7 K/uL   Lymphocytes Relative 16 %   Lymphs Abs 0.9 0.7 - 4.0 K/uL   Monocytes Relative 10 %   Monocytes Absolute 0.6 0.1 - 1.0 K/uL   Eosinophils Relative 2 %   Eosinophils Absolute 0.1 0.0 - 0.5 K/uL   Basophils Relative 1 %   Basophils Absolute 0.1 0.0 - 0.1 K/uL   Immature Granulocytes 1 %   Abs Immature Granulocytes 0.04 0.00 - 0.07 K/uL    Comment: Performed at [facility], [address], [place], [place] [address]  Sedimentation rate     Status: Abnormal   Collection Time: 04/04/21 12:45 PM  Result Value Ref Range   Sed Rate 35 (H) 0 - 22 mm/hr    Comment: Performed at [facility] [address] [place], [place] [address]  C-reactive protein     Status: Abnormal   Collection Time: 04/04/21  1:07 PM  Result Value Ref Range   CRP 1.2 (H) <1.0 mg/dL    Comment: Performed at [facility], [place] [address] [place],  [address]  Resp Panel by RT-PCR (Flu A&B, Covid) Nasopharyngeal Swab     Status: None   Collection Time: 04/04/21  1:20 PM   Specimen: Nasopharyngeal Swab; Nasopharyngeal(NP) swabs  in vial transport medium  Result Value Ref Range   SARS Coronavirus 2 by RT PCR NEGATIVE NEGATIVE    Comment: (  NOTE) SARS-CoV-2 target nucleic acids are NOT DETECTED.  The SARS-CoV-2 RNA is generally detectable in upper respiratory specimens during the acute phase of infection. The lowest concentration of SARS-CoV-2 viral copies this assay can detect is 138 copies/mL. A negative result does not preclude SARS-Cov-2 infection and should not be used as the sole basis for treatment or other patient management decisions. A negative result may occur with  improper specimen collection/handling, submission of specimen other than nasopharyngeal swab, presence of viral mutation(s) within the areas targeted by this assay, and inadequate number of viral copies(<138 copies/mL). A negative result must be combined with clinical observations, patient history, and epidemiological information. The expected result is Negative.  Fact Sheet for Patients:  EntrepreneurPulse.com.au  Fact Sheet for Healthcare Providers:  IncredibleEmployment.be  This test is no t yet approved or cleared by the [place] FDA and  has been authorized for detection and/or diagnosis of SARS-CoV-2 by FDA under an Emergency Use Authorization (EUA). This EUA will remain  in effect (meaning this test can be used) for the duration of the COVID-19 declaration under Section 564(b)(1) of the Act, 21 U.S.C.section 360bbb-3(b)(1), unless the authorization is terminated  or revoked sooner.       Influenza A by PCR NEGATIVE NEGATIVE   Influenza B by PCR NEGATIVE NEGATIVE    Comment: (NOTE) The Xpert Xpress SARS-CoV-2/FLU/RSV plus assay is intended as an aid in the diagnosis of influenza from Nasopharyngeal swab specimens and should not be used as a sole basis for treatment. Nasal washings and aspirates are unacceptable for Xpert Xpress SARS-CoV-2/FLU/RSV testing.  Fact Sheet for  Patients: EntrepreneurPulse.com.au  Fact Sheet for Healthcare Providers: IncredibleEmployment.be  This test is not yet approved or cleared by the [place] FDA and has been authorized for detection and/or diagnosis of SARS-CoV-2 by FDA under an Emergency Use Authorization (EUA). This EUA will remain in effect (meaning this test can be used) for the duration of the COVID-19 declaration under Section 564(b)(1) of the Act, 21 U.S.C. section 360bbb-3(b)(1), unless the authorization is terminated or revoked.  Performed at [facility], [place] [address] [place], [place] [address]   Blood culture (routine x 2)     Status: None (Preliminary result)   Collection Time: 04/04/21  4:40 PM   Specimen: BLOOD  Result Value Ref Range   Specimen Description BLOOD BLOOD RIGHT ARM    Special Requests      BOTTLES DRAWN AEROBIC ONLY Blood Culture adequate volume   Culture      NO GROWTH < 24 HOURS Performed at [facility], [place] [address] [place], [place] [address]    Report Status PENDING   Vancomycin, random     Status: None   Collection Time: 04/04/21  4:45 PM  Result Value Ref Range   Vancomycin Rm 22     Comment:        Random Vancomycin therapeutic range is dependent on dosage and time of specimen collection. A peak range is 20.0-40.0 ug/mL A trough range is 5.0-15.0 ug/mL        Performed at [facility] [address] [place], [place] [address]   Hepatitis B surface antigen     Status: None   Collection Time: 04/04/21  4:45 PM  Result Value Ref Range   Hepatitis B Surface Ag NON REACTIVE NON REACTIVE    Comment: Performed at [facility] [address] [place], [place] [address]  Glucose, capillary  Status: Abnormal   Collection Time: 04/04/21  6:05 PM  Result Value Ref Range   Glucose-Capillary 111 (H) 70 - 99 mg/dL    Comment: Glucose reference range applies only to samples taken after fasting for at least  8 hours.  Glucose, capillary     Status: Abnormal   Collection Time: 04/04/21 10:02 PM  Result Value Ref Range   Glucose-Capillary 132 (H) 70 - 99 mg/dL    Comment: Glucose reference range applies only to samples taken after fasting for at least 8 hours.  Basic metabolic panel     Status: Abnormal   Collection Time: 04/05/21  4:00 AM  Result Value Ref Range   Sodium 129 (L) 135 - 145 mmol/L   Potassium 3.3 (L) 3.5 - 5.1 mmol/L   Chloride 91 (L) 98 - 111 mmol/L   CO2 30 22 - 32 mmol/L   Glucose, Bld 100 (H) 70 - 99 mg/dL    Comment: Glucose reference range applies only to samples taken after fasting for at least 8 hours.   BUN 30 (H) 8 - 23 mg/dL   Creatinine, Ser 5.10 (H) 0.44 - 1.00 mg/dL   Calcium 9.7 8.9 - 10.3 mg/dL   GFR, Estimated 8 (L) >60 mL/min    Comment: (NOTE) Calculated using the CKD-EPI Creatinine Equation (2021)    Anion gap 8 5 - 15    Comment: Performed at [facility] [address] [place], [place] [address]  CBC     Status: Abnormal   Collection Time: 04/05/21  4:00 AM  Result Value Ref Range   WBC 3.7 (L) 4.0 - 10.5 K/uL   RBC 2.07 (L) 3.87 - 5.11 MIL/uL   Hemoglobin 7.1 (L) 12.0 - 15.0 g/dL   HCT 23.3 (L) 36.0 - 46.0 %   MCV 112.6 (H) 80.0 - 100.0 fL   MCH 34.3 (H) 26.0 - 34.0 pg   MCHC 30.5 30.0 - 36.0 g/dL   RDW 18.5 (H) 11.5 - 15.5 %   Platelets 141 (L) 150 - 400 K/uL   nRBC 0.0 0.0 - 0.2 %    Comment: Performed at [facility] [address], [place], [place] [address]  Glucose, capillary     Status: None   Collection Time: 04/05/21  6:47 AM  Result Value Ref Range   Glucose-Capillary 93 70 - 99 mg/dL    Comment: Glucose reference range applies only to samples taken after fasting for at least 8 hours.  Glucose, capillary     Status: None   Collection Time: 04/05/21 11:56 AM  Result Value Ref Range   Glucose-Capillary 98 70 - 99 mg/dL    Comment: Glucose reference range applies only to samples taken after fasting for at least 8  hours.    MR FOOT LEFT WO CONTRAST  Result Date: 04/04/2021 CLINICAL DATA:  Ulcer on medial left great toe, pain, infection suspected. EXAM: MRI OF THE LEFT FOOT WITHOUT CONTRAST TECHNIQUE: Multiplanar, multisequence MR imaging of the left forefoot was performed. No intravenous contrast was administered. COMPARISON:  Radiographs dated April 04, 2021 FINDINGS: Multiple sequences are degraded due to motion. Bones/Joint/Cartilage Hyperintense signal on T2 and STIR sequences in the first metatarsal head and base of the proximal phalanx of the first digit with corresponding hypointense signal on T1, which in the presence of adjacent deep skin wound is consistent with acute osteomyelitis. Ligaments Lisfranc ligament is intact. Evaluation of collateral ligaments is somewhat limited due to motion. Muscles and  Tendons Flexor, peroneal and extensor compartment tendons are intact. Hyperintense intramuscular signal of the plantar muscles concerning for diabetic myopathy/myositis. No drainable fluid collection. Soft tissue No fluid collection or hematoma. No soft tissue mass. Generalized subcutaneous soft tissue edema about dorsum of the foot. IMPRESSION: 1. Abnormal marrow signal of the head of the first metatarsal and base of the proximal phalanx of the first digit, which in the presence of adjacent deep skin wound is consistent with acute osteomyelitis/septic arthritis. 2. Deep skin wound about the medial aspect of the first metatarsal head. Soft tissue swelling about the dorsum of the foot. Electronically Signed   By: Keane Police D.O.   On: 04/04/2021 15:21    Review of Systems Blood pressure (!) 108/52, pulse 66, temperature (!) 97.5 F (36.4 C), temperature source Oral, resp. rate 18, height 5\' 7"  (1.702 m), weight 81.4 kg, SpO2 98 %. Physical Exam General: AAO x3, NAD- sitting up in bed eating lunch   Dermatological: Full-thickness ulceration with eschar, necrotic, fibrotic tissue was present along the  medial aspect of the first metatarsal head.  There is no purulence identified today but does probe close to bone.  No acute changes compared to yesterday.  There is no fluctuation or crepitation.  Vascular: Dorsalis Pedis artery and Posterior Tibial artery pedal pulses are decreased bilateral. There is no pain with calf compression, swelling, warmth, erythema.   Neruologic: Sensation decreased.   Musculoskeletal: Bunion is present    Assessment/Plan: Osteomyelitis left foot  I reviewed the MRI with her which shows osteomyelitis, septic arthritis of the first MPJ.  I discussed with her treatment options with conservative as well as surgical.  I recommend partial first ray amputation.  After discussion she seemed to agree to this.  I would like to wait for the arterial studies prior to amputation.   I attempted to call Niece, [name], at the number listed. No answer and left VM with my office contact information.   Trula Slade 04/05/2021, 12:21 PM

## 2021-04-06 ENCOUNTER — Encounter (HOSPITAL_COMMUNITY)
Admission: EM | Disposition: A | Discharge status: Skilled Nursing Facility | Payer: Self-pay | Source: Home / Self Care | Attending: Internal Medicine

## 2021-04-06 DIAGNOSIS — I70234 Atherosclerosis of native arteries of right leg with ulceration of heel and midfoot: Secondary | ICD-10-CM | POA: Diagnosis not present

## 2021-04-06 DIAGNOSIS — M86172 Other acute osteomyelitis, left ankle and foot: Secondary | ICD-10-CM

## 2021-04-06 HISTORY — PX: ABDOMINAL AORTOGRAM W/LOWER EXTREMITY: CATH118223

## 2021-04-06 HISTORY — PX: PERIPHERAL VASCULAR BALLOON ANGIOPLASTY: CATH118281

## 2021-04-06 LAB — BASIC METABOLIC PANEL
Anion gap: 10 (ref 5–15)
BUN: 22 mg/dL (ref 8–23)
CO2: 25 mmol/L (ref 22–32)
Calcium: 9 mg/dL (ref 8.9–10.3)
Chloride: 96 mmol/L — ABNORMAL LOW (ref 98–111)
Creatinine, Ser: 3.58 mg/dL — ABNORMAL HIGH (ref 0.44–1.00)
GFR, Estimated: 12 mL/min — ABNORMAL LOW (ref >60)
Glucose, Bld: 87 mg/dL (ref 70–99)
Potassium: 3.7 mmol/L (ref 3.5–5.1)
Sodium: 131 mmol/L — ABNORMAL LOW (ref 135–145)

## 2021-04-06 LAB — CBC
HCT: 21.8 % — ABNORMAL LOW (ref 36.0–46.0)
Hemoglobin: 6.8 g/dL — CL (ref 12.0–15.0)
MCH: 35.1 pg — ABNORMAL HIGH (ref 26.0–34.0)
MCHC: 31.2 g/dL (ref 30.0–36.0)
MCV: 112.4 fL — ABNORMAL HIGH (ref 80.0–100.0)
Platelets: 111 10*3/uL — ABNORMAL LOW (ref 150–400)
RBC: 1.94 MIL/uL — ABNORMAL LOW (ref 3.87–5.11)
RDW: 18.3 % — ABNORMAL HIGH (ref 11.5–15.5)
WBC: 4 10*3/uL (ref 4.0–10.5)
nRBC: 0.5 % — ABNORMAL HIGH (ref 0.0–0.2)

## 2021-04-06 LAB — GLUCOSE, CAPILLARY
Glucose-Capillary: 72 mg/dL (ref 70–99)
Glucose-Capillary: 87 mg/dL (ref 70–99)
Glucose-Capillary: 147 mg/dL — ABNORMAL HIGH (ref 70–99)

## 2021-04-06 LAB — PREPARE RBC (CROSSMATCH)

## 2021-04-06 LAB — HEPATITIS B SURFACE ANTIBODY, QUANTITATIVE: Hep B S AB Quant (Post): 7.6 m[IU]/mL — ABNORMAL LOW (ref >9.9)

## 2021-04-06 SURGERY — ABDOMINAL AORTOGRAM W/LOWER EXTREMITY
Anesthesia: LOCAL | Laterality: Left

## 2021-04-06 MED ORDER — LABETALOL HCL 5 MG/ML IV SOLN: 10.0000 mg | INTRAVENOUS | Status: DC | PRN

## 2021-04-06 MED ORDER — HEPARIN SODIUM (PORCINE) 1000 UNIT/ML IJ SOLN
INTRAMUSCULAR | Status: DC | PRN
  Administered 2021-04-06: 8000 [IU] via INTRAVENOUS
  Administered 2021-04-06: 3000 [IU] via INTRAVENOUS

## 2021-04-06 MED ORDER — LIDOCAINE HCL (PF) 1 % IJ SOLN
INTRAMUSCULAR | Status: DC | PRN
  Administered 2021-04-06: 5 mL
  Administered 2021-04-06: 10 mL

## 2021-04-06 MED ORDER — HEPARIN SODIUM (PORCINE) 1000 UNIT/ML IJ SOLN
INTRAMUSCULAR | Status: AC
  Filled 2021-04-06: qty 10

## 2021-04-06 MED ORDER — ATORVASTATIN CALCIUM 40 MG PO TABS
40.0000 mg | ORAL_TABLET | Freq: Every day | ORAL | Status: DC
  Administered 2021-04-06 – 2021-04-15 (×9): 40 mg via ORAL
  Filled 2021-04-06 (×9): qty 1

## 2021-04-06 MED ORDER — NALOXONE HCL 0.4 MG/ML IJ SOLN
INTRAMUSCULAR | Status: AC
  Filled 2021-04-06: qty 1

## 2021-04-06 MED ORDER — HEPARIN (PORCINE) IN NACL 1000-0.9 UT/500ML-% IV SOLN
INTRAVENOUS | Status: AC
  Filled 2021-04-06: qty 500

## 2021-04-06 MED ORDER — HEPARIN SODIUM (PORCINE) 1000 UNIT/ML IJ SOLN
INTRAMUSCULAR | Status: DC | PRN
  Administered 2021-04-06: 5000 [IU] via INTRAVENOUS

## 2021-04-06 MED ORDER — MIDAZOLAM HCL 2 MG/2ML IJ SOLN
INTRAMUSCULAR | Status: AC
  Filled 2021-04-06: qty 2

## 2021-04-06 MED ORDER — IODIXANOL 320 MG/ML IV SOLN
INTRAVENOUS | Status: DC | PRN
Start: 2021-04-06 — End: 2021-04-06
  Administered 2021-04-06: 150 mL

## 2021-04-06 MED ORDER — ASPIRIN EC 81 MG PO TBEC
81.0000 mg | DELAYED_RELEASE_TABLET | Freq: Every day | ORAL | Status: DC
  Administered 2021-04-07 – 2021-04-15 (×8): 81 mg via ORAL
  Filled 2021-04-06 (×9): qty 1

## 2021-04-06 MED ORDER — SODIUM CHLORIDE 0.9% FLUSH
3.0000 mL | Freq: Two times a day (BID) | INTRAVENOUS | Status: DC
  Administered 2021-04-06 – 2021-04-14 (×13): 3 mL via INTRAVENOUS

## 2021-04-06 MED ORDER — SODIUM CHLORIDE 0.9 % IV SOLN: 250.0000 mL | INTRAVENOUS | Status: DC | PRN

## 2021-04-06 MED ORDER — HYDRALAZINE HCL 20 MG/ML IJ SOLN: 5.0000 mg | INTRAMUSCULAR | Status: DC | PRN

## 2021-04-06 MED ORDER — HEPARIN (PORCINE) IN NACL 1000-0.9 UT/500ML-% IV SOLN
INTRAVENOUS | Status: DC | PRN
  Administered 2021-04-06 (×2): 500 mL

## 2021-04-06 MED ORDER — SODIUM CHLORIDE 0.9% IV SOLUTION: Freq: Once | INTRAVENOUS | Status: AC

## 2021-04-06 MED ORDER — FENTANYL CITRATE (PF) 100 MCG/2ML IJ SOLN
INTRAMUSCULAR | Status: DC | PRN
  Administered 2021-04-06 (×6): 25 ug via INTRAVENOUS

## 2021-04-06 MED ORDER — HEPARIN SODIUM (PORCINE) 5000 UNIT/ML IJ SOLN
5000.0000 [IU] | Freq: Three times a day (TID) | INTRAMUSCULAR | Status: DC
  Administered 2021-04-06 – 2021-04-08 (×5): 5000 [IU] via SUBCUTANEOUS
  Filled 2021-04-06 (×5): qty 1

## 2021-04-06 MED ORDER — LIDOCAINE HCL (PF) 1 % IJ SOLN
INTRAMUSCULAR | Status: AC
  Filled 2021-04-06: qty 30

## 2021-04-06 MED ORDER — SODIUM CHLORIDE 0.9 % IV SOLN
INTRAVENOUS | Status: AC | PRN
Start: 2021-04-06 — End: 2021-04-06
  Administered 2021-04-06: 20 mL/h via INTRAVENOUS

## 2021-04-06 MED ORDER — FENTANYL CITRATE (PF) 100 MCG/2ML IJ SOLN
INTRAMUSCULAR | Status: AC
  Filled 2021-04-06: qty 2

## 2021-04-06 MED ORDER — SODIUM CHLORIDE 0.9% FLUSH: 3.0000 mL | INTRAVENOUS | Status: DC | PRN

## 2021-04-06 MED ORDER — SODIUM CHLORIDE 0.9 % IV SOLN
1.0000 g | INTRAVENOUS | Status: DC
  Administered 2021-04-06: 1 g via INTRAVENOUS
  Filled 2021-04-06 (×3): qty 1

## 2021-04-06 MED ORDER — DEXTROSE 50 % IV SOLN
25.0000 mL | Freq: Once | INTRAVENOUS | Status: AC
  Administered 2021-04-06: 25 mL via INTRAVENOUS
  Filled 2021-04-06: qty 50

## 2021-04-06 MED ORDER — ACETAMINOPHEN 325 MG PO TABS: 650.0000 mg | ORAL_TABLET | ORAL | Status: DC | PRN

## 2021-04-06 MED ORDER — ONDANSETRON HCL 4 MG/2ML IJ SOLN: 4.0000 mg | Freq: Four times a day (QID) | INTRAMUSCULAR | Status: DC | PRN

## 2021-04-06 MED ORDER — CLOPIDOGREL BISULFATE 75 MG PO TABS
75.0000 mg | ORAL_TABLET | Freq: Every day | ORAL | Status: DC
  Administered 2021-04-07 – 2021-04-14 (×7): 75 mg via ORAL
  Filled 2021-04-06 (×7): qty 1

## 2021-04-06 SURGICAL SUPPLY — 29 items
CATH CXI 2.3F 135 ANG 2 (CATHETERS) ×1 IMPLANT
CATH CXI 2.6F 65 ANG (CATHETERS) ×2
CATH NAVICROSS ANG 65CM (CATHETERS) IMPLANT
CATH OMNI FLUSH 5F 65CM (CATHETERS) ×1 IMPLANT
CATH QUICKCROSS .035X135CM (MICROCATHETER) ×1 IMPLANT
CATH QUICKCROSS ANG SELECT (CATHETERS) ×2 IMPLANT
CATH SPRT ANG 65X2.3FR PLATN (CATHETERS) IMPLANT
CATHETER NAVICROSS ANG 65CM (CATHETERS) ×2
CLOSURE PERCLOSE PROSTYLE (VASCULAR PRODUCTS) ×1 IMPLANT
DCB RANGER 4.0X150 150 (BALLOONS) IMPLANT
GLIDEWIRE ADV .035X260CM (WIRE) ×1 IMPLANT
GLIDEWIRE NITREX 0.018X80X5 (WIRE) ×3
GUIDEWIRE NITREX 0.018X80X5 (WIRE) IMPLANT
KIT ENCORE 26 ADVANTAGE (KITS) ×1 IMPLANT
KIT MICROPUNCTURE NIT STIFF (SHEATH) ×1 IMPLANT
KIT PV (KITS) ×2 IMPLANT
RANGER DCB 4.0X150 150 (BALLOONS) ×2
SHEATH GLIDE SLENDER 4/5FR (SHEATH) ×1 IMPLANT
SHEATH HIGHFLEX ANSEL 6FRX55 (SHEATH) ×1 IMPLANT
SHEATH MICROPUNCTURE PEDAL 5FR (SHEATH) ×1 IMPLANT
SHEATH PINNACLE 5F 10CM (SHEATH) ×1 IMPLANT
SHEATH PINNACLE MP 6F 45CM (SHEATH) ×1 IMPLANT
SYR MEDRAD MARK V 150ML (SYRINGE) ×1 IMPLANT
TRANSDUCER W/STOPCOCK (MISCELLANEOUS) ×2 IMPLANT
TRAY PV CATH (CUSTOM PROCEDURE TRAY) ×2 IMPLANT
WIRE AMPLATZ SS-J .035X180CM (WIRE) ×1 IMPLANT
WIRE BENTSON .035X145CM (WIRE) ×1 IMPLANT
WIRE G V18X300CM (WIRE) ×3 IMPLANT
WIRE LUNDERQUIST .035X180CM (WIRE) ×1 IMPLANT

## 2021-04-06 NOTE — Progress Notes (Signed)
[facility] Progress Note   Subjective:  Seen in room. No complaints, no CP/dyspnea. Looks like sched for both L 1st metatarsal amputation as well as aortogram with LE runoff today. Hgb down to 6.8 - would recommend blood transfusion.  Objective Vitals:   04/06/21 0501 04/06/21 0728 04/06/21 1018 04/06/21 1036  BP: (!) 105/54 (!) 119/54 97/62 101/61  Pulse: 69 (!) 105 64 65  Resp:  16 17 18   Temp: (!) 97.5 F (36.4 C) 97.6 F (36.4 C) 97.7 F (36.5 C) 97.9 F (36.6 C)  TempSrc:  Oral Oral Oral  SpO2: 100% 100% 97% 97%  Weight:      Height:       Physical Exam General: Elderly woman, NAD. Nasal O2 in place. Heart: Irreg irregular, no murmur Lungs: CTA anteriorly Abdomen: soft Extremities: No LE edema; L MTP joint with dry wound Dialysis Access: RUE AVF + bruit  Additional Objective Labs: Basic Metabolic Panel: Recent Labs  Lab 04/04/21 1245 04/05/21 0400 04/06/21 0454  NA 129* 129* 131*  K 3.5 3.3* 3.7  CL 92* 91* 96*  CO2 25 30 25   GLUCOSE 110* 100* 87  BUN 27* 30* 22  CREATININE 4.65* 5.10* 3.58*  CALCIUM 10.2 9.7 9.0   Liver Function Tests: Recent Labs  Lab 04/04/21 1245  AST 17  ALT 10  ALKPHOS 84  BILITOT 0.6  PROT 6.8  ALBUMIN 3.1*   CBC: Recent Labs  Lab 04/04/21 1245 04/05/21 0400 04/06/21 0454  WBC 5.4 3.7* 4.0  NEUTROABS 3.8  --   --   HGB 8.3* 7.1* 6.8*  HCT 27.4* 23.3* 21.8*  MCV 115.6* 112.6* 112.4*  PLT 165 141* 111*   Studies/Results: MR FOOT LEFT WO CONTRAST  Result Date: 04/04/2021 CLINICAL DATA:  Ulcer on medial left great toe, pain, infection suspected. EXAM: MRI OF THE LEFT FOOT WITHOUT CONTRAST TECHNIQUE: Multiplanar, multisequence MR imaging of the left forefoot was performed. No intravenous contrast was administered. COMPARISON:  Radiographs dated April 04, 2021 FINDINGS: Multiple sequences are degraded due to motion. Bones/Joint/Cartilage Hyperintense signal on T2 and STIR sequences in the first  metatarsal head and base of the proximal phalanx of the first digit with corresponding hypointense signal on T1, which in the presence of adjacent deep skin wound is consistent with acute osteomyelitis. Ligaments Lisfranc ligament is intact. Evaluation of collateral ligaments is somewhat limited due to motion. Muscles and Tendons Flexor, peroneal and extensor compartment tendons are intact. Hyperintense intramuscular signal of the plantar muscles concerning for diabetic myopathy/myositis. No drainable fluid collection. Soft tissue No fluid collection or hematoma. No soft tissue mass. Generalized subcutaneous soft tissue edema about dorsum of the foot. IMPRESSION: 1. Abnormal marrow signal of the head of the first metatarsal and base of the proximal phalanx of the first digit, which in the presence of adjacent deep skin wound is consistent with acute osteomyelitis/septic arthritis. 2. Deep skin wound about the medial aspect of the first metatarsal head. Soft tissue swelling about the dorsum of the foot. Electronically Signed   By: Keane Police D.O.   On: 04/04/2021 15:21   VAS [place] ABI WITH/WO TBI  Result Date: 04/05/2021  LOWER EXTREMITY DOPPLER STUDY Patient Name:  LAFAYE MCELMURRY  Date of Exam:   04/05/2021 Medical Rec #: [id number]        Accession #:    [id number] Date of Birth: 18-Jan-1941        Patient Gender: F Patient Age:   24 years Exam Location:  [facility] Procedure:      VAS [place] ABI WITH/WO TBI Referring Phys: JOSHUA GEIPLE --------------------------------------------------------------------------------  Indications: Ulceration. High Risk Factors: Hypertension, hyperlipidemia, Diabetes, past history of                    smoking, prior MI, prior CVA. Other Factors: Afib, CHF, ESRD(HD), TIA.  Comparison Study: Previous exam on 04/19/2018 RT 0.83 & LT 0.79 Performing Technologist: Rogelia Rohrer RVT, RDMS  Examination Guidelines: A complete evaluation includes at minimum, Doppler waveform signals and  systolic blood pressure reading at the level of bilateral brachial, anterior tibial, and posterior tibial arteries, when vessel segments are accessible. Bilateral testing is considered an integral part of a complete examination. Photoelectric Plethysmograph (PPG) waveforms and toe systolic pressure readings are included as required and additional duplex testing as needed. Limited examinations for reoccurring indications may be performed as noted.  ABI Findings: +---------+------------------+-----+----------+--------+  Right     Rt Pressure (mmHg) Index Waveform   Comment   +---------+------------------+-----+----------+--------+  Brachial                                      DIA       +---------+------------------+-----+----------+--------+  PTA       190                1.74  monophasic           +---------+------------------+-----+----------+--------+  DP                                 monophasic [place]        +---------+------------------+-----+----------+--------+  Great Toe 55                 0.50  Abnormal             +---------+------------------+-----+----------+--------+ +---------+------------------+-----+-------------------+-------+  Left      Lt Pressure (mmHg) Index Waveform            Comment  +---------+------------------+-----+-------------------+-------+  Brachial  109                      biphasic                     +---------+------------------+-----+-------------------+-------+  PTA                                dampened monophasic [place]       +---------+------------------+-----+-------------------+-------+  DP                                 monophasic          [place]       +---------+------------------+-----+-------------------+-------+  Great Toe 27                 0.25  Abnormal                     +---------+------------------+-----+-------------------+-------+ +-------+-----------+-----------+------------+------------+  ABI/TBI Today's ABI Today's TBI Previous ABI Previous TBI   +-------+-----------+-----------+------------+------------+  Right   [place]          0.50        0.83         0.70          +-------+-----------+-----------+------------+------------+  Left    [place]          0.25        0.79         0.31          +-------+-----------+-----------+------------+------------+ Arterial wall calcification precludes accurate ankle pressures and ABIs.  Summary: Right: Resting right ankle-brachial index indicates noncompressible right lower extremity arteries. The right toe-brachial index is abnormal (moderate). Left: Resting left ankle-brachial index indicates noncompressible left lower extremity arteries. The left toe-brachial index is abnormal (severe).  *See table(s) above for measurements and observations.  Electronically signed by Servando Snare MD on 04/05/2021 at 3:19:58 PM.    Final     Medications:  sodium chloride     ceFEPime (MAXIPIME) IV     metronidazole 500 mg (04/06/21 0516)   vancomycin Stopped (04/05/21 1223)    (feeding supplement) PROSource Plus  30 mL Oral BID BM   Chlorhexidine Gluconate Cloth  6 each Topical Daily   Chlorhexidine Gluconate Cloth  6 each Topical Daily   [START ON 04/07/2021] darbepoetin (ARANESP) injection - DIALYSIS  200 mcg Intravenous Q Thu-HD   diltiazem  120 mg Oral Daily   ferrous sulfate  325 mg Oral BID WC   gabapentin  100 mg Oral TID   insulin aspart  0-6 Units Subcutaneous TID WC   metoprolol tartrate  12.5 mg Oral BID   pramipexole  0.125 mg Oral Daily   sevelamer carbonate  800 mg Oral TID WC   sodium chloride flush  3 mL Intravenous Q12H    Dialysis Orders: [facility] - HBsAb 11 on 05/11/20. 3:15hr, EDW 79.5kg, 2K/2.5Ca, AVF, 15g needles, no heparin - Calcitriol 1.44mcg PO q HD - Mircera 219mcg IV q 2 weeks - Venofer 50mg  IV weekly - On Vanc 1g and Cefepime 2g since 2/9. Wound Cx at OP clinic: Finegoldia magna   Assessment/Plan:  L foot osteomyelitis: Admitted for IV abx (Vanc/Cefepime/Flagyl), repeat Cx, ABIs.  Of note, has been on Vanc/Cefepime as outpatient since 03/24/21 (11 days). ABIs abnormal, sched for aortogram with LE run-off as well as L 1st toe amputation today. PAD: ABIs abnormal, VVS following.  ESRD:  Continue HD per usual TTS schedule -> next tomorrow.  Hypertension/volume: BP low sided. UF as tolerated. Holding Dilt for now, continue metop.  Anemia: Hgb 8.3 -> 7.1 -> 6.8 today. No IV iron d/t infection. FOBT ordered. For Aranesp on Thursday. Would recommend 1U PRBCs today.  Metabolic bone disease: Ca 10.2 on admit, VDRA on hold. Continue Renvela as binder.  Nutrition:  Alb low (3.1) - continue supplements. A-fib: On metoprolol, diltiazem, Eliquis. Eliquis on hold and stopping diltiazem, as above. COPD Hx CVA  Pollyann Kennedy 04/06/2021, 10:45 AM  [employer]

## 2021-04-06 NOTE — Progress Notes (Signed)
°  Daily Progress Note   Subjective: No complaints this morning  Objective: Vitals:   04/06/21 1018 04/06/21 1036  BP: 97/62 101/61  Pulse: 64 65  Resp: 17 18  Temp: 97.7 F (36.5 C) 97.9 F (36.6 C)  SpO2: 97% 97%    Physical Examination Left first metatarsal toe wound present for 2 months.  Nonpalpable  Palpable femoral arteries  ASSESSMENT/PLAN:  Patient is an 81 year old female with left lower extremity [place] 5 critical limb ischemia.  That revascularization her Wi-Fi score places her at high risk for amputation in the next year.  After discussing the risk and benefits of left lower extremity angiogram in an effort to improve distal perfusion for wound healing, [facility] elected to proceed.  I will update the primary team and Dr. Jacqualyn Posey regarding the results   Cassandria Santee MD [facility] [phone] 04/06/2021  11:32 AM

## 2021-04-06 NOTE — Op Note (Signed)
Patient [name] MRN: [id number] DOB: 1940-05-11 Sex: female  04/06/2021 Pre-operative Diagnosis: Left lower extremity Rutherford 5 critical limb ischemia Post-operative diagnosis:  Same Surgeon:  Broadus John, MD Procedure Performed: 1.  Ultrasound-guided micropuncture access of the right common femoral artery 2.  Aortogram 3.  Second-order cannulation, left lower extremity angiogram 4.  SFA drug-coated balloon angioplasty using a 4 x 150 mm Ranger balloon 5.  Ultrasound-guided micropuncture access of the left dorsalis pedis artery 6.  Device assisted closure-Pro-glide Moderate sedation 139 minutes Contrast 159ml  Indications: Patient is an 81 year old female with Rutherford 5 critical limb ischemia.  She has a 2-month history of nonhealing ulcerations on the lateral aspect of her first metatarsal head.  ABIs demonstrated severe peripheral arterial disease.  On physical exam she had nonpalpable pulses in the left foot.  After discussing the risk and benefits of left lower extremity angiography in an effort to improve distal perfusion, Jody [name] elected to proceed.  Findings:  Aortogram: Bilateral renal arteries patent, moderate calcific disease of the infrarenal abdominal aorta.  No flow-limiting stenosis appreciated in the iliac arteries bilaterally  Of the left: Normal common femoral artery, normal profunda, superficial femoral artery with multiple areas of flow-limiting stenosis, ranging from 50% to 95% in 2 locations.  Popliteal artery without stenosis.  Anterior tibial artery with 3 cm occlusion at its ostia.  This filled through collaterals and remained patent into the foot.  Luminal size was small - 1.5-2 mm.  The peroneal artery was atretic but patent to the level of the ankle giving off a perforator that filled collaterals in the foot.   Procedure:  The patient was identified in the holding area and taken to room 8.  The patient was then placed supine on the table  and prepped and draped in the usual sterile fashion.  A time out was called.  Ultrasound was used to evaluate the right common femoral artery.  It was patent .  A digital ultrasound image was acquired.  A micropuncture needle was used to access the right common femoral artery under ultrasound guidance.  An 018 wire was advanced without resistance and a micropuncture sheath was placed.  The 018 wire was removed and a benson wire was placed.  The micropuncture sheath was exchanged for a 5 french sheath.  An omniflush catheter was advanced over the wire to the level of L-1.  An abdominal angiogram was obtained.  Next, using the omniflush catheter and a benson wire, the aortic bifurcation was crossed and the catheter was placed into theleft external iliac artery and left runoff was obtained.  See findings above.  Being that the peroneal artery was atretic and filling of the foot was limited, I made the decision to intervene on both the superficial femoral artery and anterior tibial artery.  A 6 x 45cm sheath was brought to the field and positioned in the left common femoral artery.  From this position, the superficial femoral artery was traversed using an 035 system.  True lumen was confirmed, and the area was balloon angioplastied using a 4 x 150 mm drug-coated balloon.  This demonstrated resolution of the areas of stenosis.  Next, using a series of 018 and 014 wires and catheters I was able to enter the ostia of the anterior tibial artery.  I was not however able to traverse the 3cm densely calcified lesion.  I made the decision to attempt retrograde access.  The ultrasound was re-prepped and distal anterior tibial artery  accessed using ultrasound.  I was able to traverse the anterior tibial artery to the level of the 3 cm occlusion from retrograde approach, but was unable to cross the lesion.  Completion angiography demonstrated resolution of flow-limiting stenosis within the superficial femoral artery.  There  was no anterior tibial artery runoff, but this was due to the sheath present in the distal anterior tibial artery that was occlusive.  The peroneal artery ran to the ankle with medial lateral perforators entering the foot.  Impression: Successful drug-coated balloon angioplasty of the superficial femoral artery. Unsuccessful recannulization of the anterior tibial artery.    Cassandria Santee, MD [facility] Office: [phone]

## 2021-04-06 NOTE — Progress Notes (Signed)
Progress Note   Patient: Jody [name]:[id number] DOB: [birth year] DOA: 04/04/2021     2 DOS: the patient was seen and examined on 04/06/2021   Brief hospital course: 81 year old past medical history significant for ESRD on hemodialysis TTS, diabetes type 2, A-fib, COPD on chronic 2 L of oxygen, diastolic heart failure, anemia of chronic kidney disease, hypertension, history of CVA who presents to the ED with concern of left foot osteomyelitis/PAD.  She was sent from podiatry clinic.  Per patient she has been having left foot problem for about 2 weeks.  Patient has been getting IV antibiotics for the last 2 weeks with hemodialysis as an outpatient.  Culture grew Finegoldia magna.   MRI of the left foot: Show abnormal marrow signal of the head of the first metatarsal and base of the proximal phalanx of the first digit, which in the presence of adjacent deep skin wound is consistent with acute osteomyelitis/septic arthritis.  Deep skin wound about the medial aspect of the first metatarsal head.    Assessment and Plan: * Acute osteomyelitis of left foot ([name])- (present on admission) Presents with Acute osteomyelitis of left big toe, failed IV abx outpatient -Has been receiving IV vancomycin and cefepime at dialysis since 03/24/21. Wound culture grew out finegoldia magna. -Continue with Vancomycin, Cefepime and flagyl.  -Dr Jacqualyn Posey recommend Partial first ray amputation.  -ABI: Right and left resting right ankle-brachial index indicate noncompressible right lower extremity arteries.  Right and left toe brachial index is abnormal. -For arteriogram 2/22: Successful drug-coated balloon angioplasty of the superficial femoral artery.  Unsuccessful recanalization of the anterior tibial artery. -Follow Dr Jacqualyn Posey recommendation for timing for Sx.   Hypokalemia Correction with HD.   [name], asked that she be called for medical decisions.  Delirium precautions  Check   B12  Hyponatremia Likely secondary to renal failure Continue to follow   Anemia of chronic kidney failure Hb down to 6.8 Baseline 7-8 No IV iron with infection. On Aranesp.  One unit PRBC ordered.   Chronic respiratory failure (HCC) Stable on 2 L oxygen.   History of CVA (cerebrovascular accident) On /eliquis, holding eliqus in case of procedure   ESRD on dialysis [name]) On dialysis TTS. Nephrology following.  Had HD 2/21  Type 2 diabetes mellitus with nephropathy ([name])- (present on admission) a1c of 5.3 in 02/2021. SSI and accuchecks   Chronic diastolic CHF (congestive heart failure) ([name])- (present on admission) Last echo: 06/2020: EF of 45-50% with mildly reduced LVF. Hypokinesis of the inferoseptal, inferolateral and distal inferior walls . Diastolic parameters indeterminate.  Volume management with HD  Discontinue lasix.   Atrial fibrillation ([name])- (present on admission) Rate controlled Hold Eliquis  in case of procedure  Continue cardizem and metoprolol.   COPD with chornic respiratory failure on oxygen 2L [name]- (present on admission) On chronic 2L oxygen, continue No signs of exacerbation. Continue with albuterol prn   Essential hypertension- (present on admission) Continue cardizem 120mg  daily and metoprolol         Subjective:  She denies dyspnea, she would be ok with having Sx,  Denies chest pain   Physical Exam: Vitals:   04/06/21 1421 04/06/21 1426 04/06/21 1501 04/06/21 1630  BP: (!) 96/51 (!) 103/46 112/88 126/81  Pulse: (!) 51 (!) 59 (!) 59   Resp: 14 17 20 15   Temp:   (!) 96.7 F (35.9 C) 97.6 F (36.4 C)  TempSrc:   Oral   SpO2: 100% 100% 90%  90%  Weight:      Height:       General; NAD Lung; CTA Abdomen; Soft, nt Extremity: left foot with discoloration, ulcer near Big toe.   Data Reviewed:  Cbc reviewed.   Family Communication: Niece over phone.   Disposition: Status is: Inpatient Remains inpatient appropriate because:  remain in hospital for management of infection.           Planned Discharge Destination: Home     Time spent: 45 minutes  Author: Elmarie Shiley, MD 04/06/2021 5:46 PM  For on call review [email or web address].

## 2021-04-06 NOTE — Progress Notes (Signed)
Pt arrived from Cath Lab, groin level 0 with some oozing, not oriented at all. Wounds on heels and bottom. Tele started, oriented to unit.    Raelyn Number, RN 04/06/2021 3:03 PM

## 2021-04-06 NOTE — Plan of Care (Signed)
°  Problem: Education: Goal: Knowledge of disease or condition will improve Outcome: Progressing   Problem: Activity: Goal: Ability to tolerate increased activity will improve Outcome: Progressing   Problem: Education: Goal: Knowledge of General Education information will improve Description: Including pain rating scale, medication(s)/side effects and non-pharmacologic comfort measures Outcome: Progressing   Problem: Activity: Goal: Risk for activity intolerance will decrease Outcome: Progressing   Problem: Safety: Goal: Ability to remain free from injury will improve Outcome: Progressing   Problem: Skin Integrity: Goal: Risk for impaired skin integrity will decrease Outcome: Progressing

## 2021-04-06 NOTE — Progress Notes (Signed)
SLP Cancellation Note  Patient Details [name] MRN: [id number] DOB: 1941-02-06   Cancelled treatment:       Reason Eval/Treat Not Completed: Per RN, pt NPO for procedure today. SLP to f/u for dysphagia therapy/diet tolerance.     Ellwood Dense, [place], [place] [facility] Services Office Number: [phone]  Acie Fredrickson 04/06/2021, 9:52 AM

## 2021-04-06 NOTE — Progress Notes (Signed)
Subjective: 81 year old female admitted for ulceration of the left foot.  Found to have osteomyelitis.  Arterial studies abnormal scheduled for angio today.  Hemoglobin low at 6.8 and currently getting transfused.  States that she wants to have breakfast.  Objective: AAO x3, NAD Pulses decreased. Overall exam unchanged.  Wound present to the medial aspect of the left first metatarsal head with necrotic tissue.  There is no purulence noted today.  Edema present left foot.  No fluctuation or crepitation.  No malodor. No pain with calf compression, swelling, warmth, erythema  Assessment: Osteomyelitis left foot, PAD  Plan: Discussion for angio today.  Likely need partial first amputation pending evaluation of circulation.  Currently being transfused. Podiatry will continue to follow.  Celesta Gentile, DPM

## 2021-04-06 NOTE — Progress Notes (Signed)
PT Cancellation Note  Patient Details [name] MRN: [id number] DOB: Dec 23, 1940   Cancelled Treatment:    Reason Eval/Treat Not Completed: Medical issues which prohibited therapy. Pt going for angiogram and likely will not be coming back up on the floor, per RN.  Will reattempt.   Zenaida Niece 04/06/2021, 1:30 PM

## 2021-04-06 NOTE — Progress Notes (Signed)
OT Cancellation Note  Patient Details [name] MRN: [id number] DOB: 1940-12-25   Cancelled Treatment:    Reason Eval/Treat Not Completed: Patient at procedure or test/ unavailable.  Pt going for angiogram and likely will not be coming back up on the floor, per RN.  Will reattempt.  Nilsa Nutting., OTR/L Acute Rehabilitation Services Pager [phone] Office [phone]   [name] 04/06/2021, 11:02 AM

## 2021-04-07 ENCOUNTER — Encounter (HOSPITAL_COMMUNITY): Payer: Self-pay | Admitting: Vascular Surgery

## 2021-04-07 DIAGNOSIS — R001 Bradycardia, unspecified: Secondary | ICD-10-CM

## 2021-04-07 DIAGNOSIS — I70234 Atherosclerosis of native arteries of right leg with ulceration of heel and midfoot: Secondary | ICD-10-CM

## 2021-04-07 DIAGNOSIS — M86172 Other acute osteomyelitis, left ankle and foot: Secondary | ICD-10-CM

## 2021-04-07 LAB — LIPID PANEL
Cholesterol: 101 mg/dL (ref 0–200)
HDL: 47 mg/dL (ref >40)
LDL Cholesterol: 44 mg/dL (ref 0–99)
Total CHOL/HDL Ratio: 2.1 RATIO
Triglycerides: 50 mg/dL (ref <150)
VLDL: 10 mg/dL (ref 0–40)

## 2021-04-07 LAB — TYPE AND SCREEN
ABO/RH(D): O POS
Antibody Screen: NEGATIVE
Unit division: 0

## 2021-04-07 LAB — MAGNESIUM: Magnesium: 2.2 mg/dL (ref 1.7–2.4)

## 2021-04-07 LAB — BPAM RBC
Blood Product Expiration Date: 202303012359
ISSUE DATE / TIME: 202302221011
Unit Type and Rh: 5100

## 2021-04-07 LAB — GLUCOSE, CAPILLARY
Glucose-Capillary: 117 mg/dL — ABNORMAL HIGH (ref 70–99)
Glucose-Capillary: 127 mg/dL — ABNORMAL HIGH (ref 70–99)
Glucose-Capillary: 93 mg/dL (ref 70–99)

## 2021-04-07 LAB — BASIC METABOLIC PANEL
Anion gap: 8 (ref 5–15)
BUN: 26 mg/dL — ABNORMAL HIGH (ref 8–23)
CO2: 27 mmol/L (ref 22–32)
Calcium: 9.4 mg/dL (ref 8.9–10.3)
Chloride: 97 mmol/L — ABNORMAL LOW (ref 98–111)
Creatinine, Ser: 4.25 mg/dL — ABNORMAL HIGH (ref 0.44–1.00)
GFR, Estimated: 10 mL/min — ABNORMAL LOW (ref >60)
Glucose, Bld: 96 mg/dL (ref 70–99)
Potassium: 3.8 mmol/L (ref 3.5–5.1)
Sodium: 132 mmol/L — ABNORMAL LOW (ref 135–145)

## 2021-04-07 LAB — CBC
HCT: 24.5 % — ABNORMAL LOW (ref 36.0–46.0)
Hemoglobin: 7.7 g/dL — ABNORMAL LOW (ref 12.0–15.0)
MCH: 34.2 pg — ABNORMAL HIGH (ref 26.0–34.0)
MCHC: 31.4 g/dL (ref 30.0–36.0)
MCV: 108.9 fL — ABNORMAL HIGH (ref 80.0–100.0)
Platelets: 106 10*3/uL — ABNORMAL LOW (ref 150–400)
RBC: 2.25 MIL/uL — ABNORMAL LOW (ref 3.87–5.11)
RDW: 21.4 % — ABNORMAL HIGH (ref 11.5–15.5)
WBC: 6.2 10*3/uL (ref 4.0–10.5)
nRBC: 0 % (ref 0.0–0.2)

## 2021-04-07 LAB — VITAMIN B12: Vitamin B-12: 358 pg/mL (ref 180–914)

## 2021-04-07 MED ORDER — SODIUM CHLORIDE 0.9 % IV SOLN: 2.0000 g | INTRAVENOUS | Status: DC

## 2021-04-07 MED ORDER — VITAMIN B-12 100 MCG PO TABS
100.0000 ug | ORAL_TABLET | Freq: Every day | ORAL | Status: DC
  Administered 2021-04-09 – 2021-04-15 (×7): 100 ug via ORAL
  Filled 2021-04-07 (×9): qty 1

## 2021-04-07 NOTE — Plan of Care (Signed)

## 2021-04-07 NOTE — Progress Notes (Signed)
Speech Language Pathology Treatment: Dysphagia  Patient Details [name] MRN: [id number] DOB: 25-Mar-1940 Today's Date: 04/07/2021 Time: 6073-7106 SLP Time Calculation (min) (ACUTE ONLY): 20 min  Assessment / Plan / Recommendation Clinical Impression  Pt consumed small bites of chopped pears, voicing difficulty with mastication because they were too hard. With additional time and Min cues for liquid wash, but did clear her oral cavity without overt s/s of aspiration with solids or liquids. She does not like these slightly more solid foods even as such a small piece though. She did say that she liked the textures on her meal trays better and RN confirms that she ate most of what was on her trays today. Will leave on Dys 2 diet and thin liquids. Offered to return at least once after scheduled procedure, but pt and niece prefer to have [place] f/u only on a PRN basis. Per their preference, SLP will sign off acutely, but please reconsult with any acute changes.    HPI HPI: Pt is an 81 yo female presenting with concerns for L foot osteomyelitis/PAD. Swallow eval was ordered after pt was observed to be coughing with PO intake on 2/20. Previous swallow eval in April 2016 revealed R sided weakness with Dys 3 diet and thin liquids recommended as pt was also missing her dentures at the time. PMH also includes: ESRD on HD TTS, T2DM, atrial fibrillation, COPD on chronic oxygen at 2L,  diastolic CHF, anemia in CKD, HTN, CVA, esophageal dysmotility per esophagram in 2019      SLP Plan  All goals met      Recommendations for follow up therapy are one component of a multi-disciplinary discharge planning process, led by the attending physician.  Recommendations may be updated based on patient status, additional functional criteria and insurance authorization.    Recommendations  Diet recommendations: Dysphagia 2 (fine chop);Thin liquid Liquids provided via: Cup;Straw Medication Administration: Whole  meds with liquid Supervision: Staff to assist with self feeding Compensations: Minimize environmental distractions;Slow rate;Small sips/bites Postural Changes and/or Swallow Maneuvers: Seated upright 90 degrees                Oral Care Recommendations: Oral care BID Follow Up Recommendations: Skilled nursing-short term rehab (<3 hours/day) Assistance recommended at discharge: Intermittent Supervision/Assistance SLP Visit Diagnosis: Dysphagia, unspecified (R13.10) Plan: All goals met           Jody [name]., M.A. [place] Acute [facility] Pager [phone] Office [phone]  04/07/2021, 3:13 PM

## 2021-04-07 NOTE — Progress Notes (Signed)
Pharmacy Antibiotic Note  Jody [name] is a 81 y.o. female admitted on 04/04/2021 with  osteomyelitis .  Pharmacy has been consulted for vancomycin and cefepime dosing.  Patient was on vancomycin 1g qHD and cefepime 2g qHD PTA for osteomyelitis. Per nephrology, a foot wound culture on 03/24/21 grew Finegoldia magna.  S/p agiogram and unsuccessful recannulization of the ATA yesterday 2/22.   Plan: Dialysis on schedule planned today 2/23 Continue Vancomycin 1g qHD Transition to Cefepime 2g qHD Trend WBC, temp, renal function  F/U infectious work-up Drug levels as indicated Height: 5\' 7"  (170.2 cm) Weight: 81.7 kg (180 lb 1.9 oz) IBW/kg (Calculated) : 61.6  Temp (24hrs), Avg:97.6 F (36.4 C), Min:96.7 F (35.9 C), Max:97.9 F (36.6 C)  Recent Labs  Lab 04/04/21 1245 04/04/21 1645 04/05/21 0400 04/06/21 0454 04/07/21 0222  WBC 5.4  --  3.7* 4.0 6.2  CREATININE 4.65*  --  5.10* 3.58* 4.25*  LATICACIDVEN 1.6  --   --   --   --   VANCORANDOM  --  22  --   --   --      Estimated Creatinine Clearance: 11 mL/min (A) (by C-G formula based on SCr of 4.25 mg/dL (H)).    Allergies  Allergen Reactions   Codeine Nausea And Vomiting    Antimicrobials this admission: Vancomycin 2/9 >>  Cefepime 2/9 >>  Flagyl 2/20>>  Dose adjustments this admission: 2/20 Random vancomycin level 22 - continue current dosing  Microbiology results: 2/20 BCx: ngtd 2/9 wound Cx: Finegoldia magna  Thank you for allowing pharmacy to be a part of this patients care.  Erin Hearing PharmD., BCPS Clinical Pharmacist 04/07/2021 9:06 AM

## 2021-04-07 NOTE — Progress Notes (Signed)
[facility] Progress Note   Subjective:  Seen at start of dialysis - no complaints today. Denies CP/dyspnea. HD was delayed today d/t bradycardia this morning - better now.  Objective Vitals:   04/07/21 0531 04/07/21 0800 04/07/21 0815 04/07/21 1303  BP:  102/70  (!) 145/99  Pulse:  (!) 50  63  Resp:  10 17 (!) 21  Temp:  97.8 F (36.6 C)  (!) 97.3 F (36.3 C)  TempSrc:  Oral  Oral  SpO2:  100% 100% 100%  Weight: 81.7 kg     Height:       Physical Exam General: Elderly woman, NAD. Nasal O2 in place. Heart: Irreg irregular, no murmur Lungs: CTA anteriorly Abdomen: soft Extremities: No LE edema; L MTP joint with dry wound Dialysis Access: RUE AVF + bruit  Additional Objective Labs: Basic Metabolic Panel: Recent Labs  Lab 04/05/21 0400 04/06/21 0454 04/07/21 0222  NA 129* 131* 132*  K 3.3* 3.7 3.8  CL 91* 96* 97*  CO2 30 25 27   GLUCOSE 100* 87 96  BUN 30* 22 26*  CREATININE 5.10* 3.58* 4.25*  CALCIUM 9.7 9.0 9.4   Liver Function Tests: Recent Labs  Lab 04/04/21 1245  AST 17  ALT 10  ALKPHOS 84  BILITOT 0.6  PROT 6.8  ALBUMIN 3.1*   CBC: Recent Labs  Lab 04/04/21 1245 04/05/21 0400 04/06/21 0454 04/07/21 0222  WBC 5.4 3.7* 4.0 6.2  NEUTROABS 3.8  --   --   --   HGB 8.3* 7.1* 6.8* 7.7*  HCT 27.4* 23.3* 21.8* 24.5*  MCV 115.6* 112.6* 112.4* 108.9*  PLT 165 141* 111* 106*   Blood Culture    Component Value Date/Time   SDES BLOOD LEFT ANTECUBITAL 04/05/2021 0359   SPECREQUEST  04/05/2021 0359    BOTTLES DRAWN AEROBIC AND ANAEROBIC Blood Culture results may not be optimal due to an excessive volume of blood received in culture bottles   CULT  04/05/2021 0359    NO GROWTH 2 DAYS Performed at [facility], [place] [address] [place], [place] [address]    REPTSTATUS PENDING 04/05/2021 0165   Studies/Results: PERIPHERAL VASCULAR CATHETERIZATION  Result Date: 04/06/2021 Images from the original result were not included. Patient  [name] MRN: [id number] DOB: 05/21/1940 Sex: female 04/06/2021 Pre-operative Diagnosis: Left lower extremity Rutherford 5 critical limb ischemia Post-operative diagnosis:  Same Surgeon:  Broadus John, MD Procedure Performed: 1.  Ultrasound-guided micropuncture access of the right common femoral artery 2.  Aortogram 3.  Second-order cannulation, left lower extremity angiogram 4.  SFA drug-coated balloon angioplasty using a 4 x 150 mm Ranger balloon 5.  Ultrasound-guided micropuncture access of the left dorsalis pedis artery 6.  Device assisted closure-Pro-glide Moderate sedation 139 minutes Contrast 166ml Indications: Patient is an 81 year old female with Rutherford 5 critical limb ischemia.  She has a 21-month history of nonhealing ulcerations on the lateral aspect of her first metatarsal head.  ABIs demonstrated severe peripheral arterial disease.  On physical exam she had nonpalpable pulses in the left foot.  After discussing the risk and benefits of left lower extremity angiography in an effort to improve distal perfusion, Anyela elected to proceed. Findings: Aortogram: Bilateral renal arteries patent, moderate calcific disease of the infrarenal abdominal aorta.  No flow-limiting stenosis appreciated in the iliac arteries bilaterally Of the left: Normal common femoral artery, normal profunda, superficial femoral artery with multiple areas of flow-limiting stenosis, ranging from 50% to 95% in 2 locations.  Popliteal artery without stenosis.  Anterior tibial artery with 3 cm occlusion at its ostia.  This filled through collaterals and remained patent into the foot.  Luminal size was small - 1.5-2 mm.  The peroneal artery was atretic but patent to the level of the ankle giving off a perforator that filled collaterals in the foot. Procedure:  The patient was identified in the holding area and taken to room 8.  The patient was then placed supine on the table and prepped and draped in the usual sterile  fashion.  A time out was called.  Ultrasound was used to evaluate the right common femoral artery.  It was patent .  A digital ultrasound image was acquired.  A micropuncture needle was used to access the right common femoral artery under ultrasound guidance.  An 018 wire was advanced without resistance and a micropuncture sheath was placed.  The 018 wire was removed and a benson wire was placed.  The micropuncture sheath was exchanged for a 5 french sheath.  An omniflush catheter was advanced over the wire to the level of L-1.  An abdominal angiogram was obtained.  Next, using the omniflush catheter and a benson wire, the aortic bifurcation was crossed and the catheter was placed into theleft external iliac artery and left runoff was obtained.  See findings above. Being that the peroneal artery was atretic and filling of the foot was limited, I made the decision to intervene on both the superficial femoral artery and anterior tibial artery.  A 6 x 45cm sheath was brought to the field and positioned in the left common femoral artery.  From this position, the superficial femoral artery was traversed using an 035 system.  True lumen was confirmed, and the area was balloon angioplastied using a 4 x 150 mm drug-coated balloon.  This demonstrated resolution of the areas of stenosis.  Next, using a series of 018 and 014 wires and catheters I was able to enter the ostia of the anterior tibial artery.  I was not however able to traverse the 3cm densely calcified lesion.  I made the decision to attempt retrograde access.  The ultrasound was re-prepped and distal anterior tibial artery accessed using ultrasound.  I was able to traverse the anterior tibial artery to the level of the 3 cm occlusion from retrograde approach, but was unable to cross the lesion. Completion angiography demonstrated resolution of flow-limiting stenosis within the superficial femoral artery.  There was no anterior tibial artery runoff, but this was  due to the sheath present in the distal anterior tibial artery that was occlusive.  The peroneal artery ran to the ankle with medial lateral perforators entering the foot. Impression: Successful drug-coated balloon angioplasty of the superficial femoral artery. Unsuccessful recannulization of the anterior tibial artery. Cassandria Santee, MD [facility]: [phone]    Medications:  sodium chloride     sodium chloride     ceFEPime (MAXIPIME) IV     metronidazole 500 mg (04/07/21 0502)   vancomycin Stopped (04/05/21 1223)    (feeding supplement) PROSource Plus  30 mL Oral BID BM   aspirin EC  81 mg Oral Daily   atorvastatin  40 mg Oral Daily   Chlorhexidine Gluconate Cloth  6 each Topical Daily   Chlorhexidine Gluconate Cloth  6 each Topical Daily   clopidogrel  75 mg Oral Q breakfast   darbepoetin (ARANESP) injection - DIALYSIS  200 mcg Intravenous Q Thu-HD   ferrous sulfate  325 mg Oral BID WC   gabapentin  100 mg Oral TID   heparin  5,000 Units Subcutaneous Q8H   insulin aspart  0-6 Units Subcutaneous TID WC   metoprolol tartrate  12.5 mg Oral BID   pramipexole  0.125 mg Oral Daily   sevelamer carbonate  800 mg Oral TID WC   sodium chloride flush  3 mL Intravenous Q12H   sodium chloride flush  3 mL Intravenous Q12H   vitamin B-12  100 mcg Oral Daily    Dialysis Orders: TTS at [facility] on 05/11/20. 3:15hr, EDW 79.5kg, 2K/2.5Ca, AVF, 15g needles, no heparin - Calcitriol 1.39mcg PO q HD - Mircera 22mcg IV q 2 weeks - Venofer 50mg  IV weekly - On Vanc 1g and Cefepime 2g since 2/9. Wound Cx at OP clinic: Finegoldia magna   Assessment/Plan:  L foot osteomyelitis: Has been on Vanc/Cefepime since 03/24/21, continued here and Flagyl added. Repeat Blood Cx negative. For toe amputation per ortho. PAD: ABIs abnormal, VVS following. ABIs abnormal, now s/p angioplasty to L SFA.  ESRD:  Continue HD per usual TTS schedule -> HD now.   Hypertension/volume: BP low sided, bradycardiac this AM. Diltiazem has been stopped, metoprolol hs holding parameters.  Anemia: Hgb 7.7, s/p 1U PRBCs on 2/22. No IV iron d/t infection.  For Aranesp today.  Metabolic bone disease: Ca 10.2 on admit, VDRA on hold. Continue Renvela as binder.  Nutrition:  Alb low (3.1) - continue supplements. A-fib: On metoprolol, diltiazem, Eliquis on admit. Eliquis and diltiazem stopped. COPD Hx CVA  Jody [name] 04/07/2021, 3:38 PM  [facility]

## 2021-04-07 NOTE — Progress Notes (Signed)
OT Cancellation Note  Patient Details [name] MRN: [id number] DOB: [birth year]   Cancelled Treatment:    Reason Eval/Treat Not Completed: Patient at procedure or test/ unavailable  First attempt @ 1312: Pt finishing with PT.   Second attempt @ 1347: Bath with nursing staff.  Third attempt @ 1646: Pt off the floor at HD.   Will return as schedule allows.   Sylvester, OTR/L Acute Rehab Office: [phone] 04/07/2021, 4:46 PM

## 2021-04-07 NOTE — Progress Notes (Signed)
Pt's HR has been dropping to 30-40's while sleeping, non sustained. This morning HR dropped to 27, 29 while Pt was sleeping. Little drowsy, non-symptomatic. BP 96/49. On call [facility] MD paged, awaiting call back.  Dialysis RN penny had called, made aware of the pt's situation. Passed on to day shift RN to call dialysis back.

## 2021-04-07 NOTE — Evaluation (Signed)
Physical Therapy Evaluation Patient Details [name] MRN: [id number] DOB: Apr 29, 1940 Today's Date: 04/07/2021  History of Present Illness  81 y.o. female presents to [facility] hospital on 04/04/2021 with concerns for osteomyelitis of L foot. S/p angiogram with SFA drug coated balloon angioplasty and unsuccessful recannulization of the ATA 2/22. PMH includes ESRD, DMII, Afib, COPD, CHF, HTN, CVA.   Clinical Impression  Pt presents with condition above and deficits mentioned below, see PT Problem List. Pt with impaired cognition with apparent memory deficits, unsure if this is her recent baseline and her niece is unsure also. Pt an unreliable historian with inconsistent answers to questions and perseverating on dialysis appointments and other details. Niece, [name], reports prior to her hospitalization in January 2023 she was living at home alone with assistance from an aide 5x/week, but then the pt was discharged to a SNF for rehab s/p falls at home. Currently, pt is very fearful of falling, resisting coming to stand with a strong posterior lean when attempting to stand with UE support on RW or PT. She was eventually successful in coming to stand briefly with modA when using the stedy to get her to facilitate a weight shift anteriorly. Pt with deficits in cognition, balance, gross overall strength, and activity tolerance that place her at high risk for falls. She would benefit from returning to the SNF for further rehab to improve her independence and safety with all functional mobility. Pt is hopeful to eventually return home, but niece is aware this may not be an option due to her safety concerns being home alone and is open to discussing long-term care options. Will continue to follow acutely.     Recommendations for follow up therapy are one component of a multi-disciplinary discharge planning process, led by the attending physician.  Recommendations may be updated based on patient status,  additional functional criteria and insurance authorization.  Follow Up Recommendations Skilled nursing-short term rehab (<3 hours/day)    Assistance Recommended at Discharge Frequent or constant Supervision/Assistance  Patient can return home with the following  A lot of help with walking and/or transfers;Two people to help with walking and/or transfers;A lot of help with bathing/dressing/bathroom;Two people to help with bathing/dressing/bathroom;Assistance with cooking/housework;Assistance with feeding;Direct supervision/assist for medications management;Direct supervision/assist for financial management;Assist for transportation;Help with stairs or ramp for entrance    Equipment Recommendations Other (comment) (defer to next venue of care)  Recommendations for Other Services       Functional Status Assessment Patient has had a recent decline in their functional status and demonstrates the ability to make significant improvements in function in a reasonable and predictable amount of time.     Precautions / Restrictions Precautions Precautions: Fall;Other (comment) Precaution Comments: monitor BP and HR (low) Restrictions Weight Bearing Restrictions: No      Mobility  Bed Mobility Overal bed mobility: Needs Assistance Bed Mobility: Supine to Sit, Sit to Supine     Supine to sit: Mod assist, HOB elevated Sit to supine: Max assist, HOB elevated   General bed mobility comments: Pt with good initiation of legs off EOB but required modA to ascend trunk and scoot hips to EOB. MaxA to manage trunk and legs back to supine.    Transfers Overall transfer level: Needs assistance Equipment used: Rolling walker (2 wheels), 1 person hand held assist, Ambulation equipment used Transfers: Sit to/from Stand Sit to Stand: Mod assist, Total assist, From elevated surface           General transfer comment:  Attempted coming to stand from EOB to RW 2x but pt leaning posteriorly, thus  attempted anterior approach with cues for pt to hold onto PT with bil knees blocked. Unable to clear buttocks still despite multiple attempts and TA due to posterior lean. Attempted with stedy with pt initiating coming to stand well with improved anterior transition of weight, modA.    Ambulation/Gait               General Gait Details: Unable  Stairs            Wheelchair Mobility    Modified Rankin (Stroke Patients Only)       Balance Overall balance assessment: Needs assistance Sitting-balance support: No upper extremity supported, Feet supported Sitting balance-Leahy Scale: Fair Sitting balance - Comments: Static sitting EOB with min guard-supervision for safety, truncal sway noted. Postural control: Posterior lean Standing balance support: Bilateral upper extremity supported Standing balance-Leahy Scale: Poor Standing balance comment: ModA and bil UE support to stand in stedy for ~30 sec before sitting to rest, knee buckling noted                             Pertinent Vitals/Pain Pain Assessment Pain Assessment: Faces Faces Pain Scale: Hurts little more Pain Location: R shoulder Pain Descriptors / Indicators: Discomfort, Grimacing, Guarding Pain Intervention(s): Limited activity within patient's tolerance, Monitored during session, Repositioned    Home Living Family/patient expects to be discharged to:: Skilled nursing facility                   Additional Comments: Pt went to SNF around 03/07/21, but prior to this was living alone with an aide coming 5x/week and occasional assistance from nieces and nephew. Called niece on file for info    Prior Function Prior Level of Function : History of Falls (last six months);Needs assist       Physical Assist : Mobility (physical);ADLs (physical)     Mobility Comments: Prior to hospitalization and going to SNF in January 2023, pt was ambulating household distances mod I with RW ADLs Comments:  Prior to hospitalization and going to SNF in January 2023, pt was getting assistance from an aide M-F for transportation, cleaning, etc. Pt was doing bird baths at sink.     Hand Dominance        Extremity/Trunk Assessment   Upper Extremity Assessment Upper Extremity Assessment: Defer to OT evaluation    Lower Extremity Assessment Lower Extremity Assessment: Generalized weakness;Difficult to assess due to impaired cognition (reports numbness in L lower leg but able to detect touch distally at foot)    Cervical / Trunk Assessment Cervical / Trunk Assessment: Kyphotic  Communication   Communication: No difficulties  Cognition Arousal/Alertness: Awake/alert Behavior During Therapy: Anxious Overall Cognitive Status: No family/caregiver present to determine baseline cognitive functioning                                 General Comments: Called niece, [name], and she is unsure if pt was confused prior to hospitalization in January 2023 as [name] works and caregives for her own mother. At this time, pt is pleasantly confused, asking what day of week it is and perseverating on her dailysis schedule, fear of falling, assistance from aide, etc. Provided pt info on her being at [facility] hospital, which she was unaware she was even in a hospital initially. Pt  unable to recall being at [facility] with repeated questioning later in session. Slow processing and needs continual redirection        General Comments General comments (skin integrity, edema, etc.): BP and HR increased with activity, SpO2 with poor waveform but reading as low as 80s% on 2L at times but rebounded to 96% end of session    Exercises     Assessment/Plan    PT Assessment Patient needs continued PT services  PT Problem List Decreased strength;Decreased activity tolerance;Decreased balance;Decreased range of motion;Decreased mobility;Decreased knowledge of use of DME;Decreased cognition;Cardiopulmonary status limiting  activity       PT Treatment Interventions DME instruction;Gait training;Functional mobility training;Therapeutic activities;Therapeutic exercise;Balance training;Neuromuscular re-education;Cognitive remediation;Patient/family education;Wheelchair mobility training    PT Goals (Current goals can be found in the Care Plan section)  Acute Rehab PT Goals Patient Stated Goal: to go home eventually PT Goal Formulation: With patient/family Time For Goal Achievement: 04/21/21 Potential to Achieve Goals: Fair    Frequency Min 2X/week     Co-evaluation               AM-PAC PT "6 Clicks" Mobility  Outcome Measure Help needed turning from your back to your side while in a flat bed without using bedrails?: A Lot Help needed moving from lying on your back to sitting on the side of a flat bed without using bedrails?: A Lot Help needed moving to and from a bed to a chair (including a wheelchair)?: Total Help needed standing up from a chair using your arms (e.g., wheelchair or bedside chair)?: A Lot Help needed to walk in hospital room?: Total Help needed climbing 3-5 steps with a railing? : Total 6 Click Score: 9    End of Session Equipment Utilized During Treatment: Oxygen Activity Tolerance: Patient tolerated treatment well Patient left: in bed;with call bell/phone within reach;with bed alarm set Nurse Communication: Mobility status PT Visit Diagnosis: Unsteadiness on feet (R26.81);Muscle weakness (generalized) (M62.81);History of falling (Z91.81);Difficulty in walking, not elsewhere classified (R26.2)    Time: 8921-1941 PT Time Calculation (min) (ACUTE ONLY): 52 min   Charges:   PT Evaluation $PT Eval Moderate Complexity: 1 Mod PT Treatments $Therapeutic Activity: 23-37 mins        Moishe Spice, PT, DPT Acute Rehabilitation Services  Pager: [phone] Office: [phone]   Orvan Falconer 04/07/2021, 1:49 PM

## 2021-04-07 NOTE — Progress Notes (Signed)
Subjective: 81 year old female admitted for ulceration of the left foot.  Underwent angio yesterday with vascular surgery.  Found to have only peroneal runoff and small vessel disease.  Unfortunatly she is at high risk of proximal limb loss but given the infection we will proceed with partial first ray mutation which is scheduled for tomorrow.  Patient seen in dialysis.  No fevers or chills that she reports.  Objective: AAO x3, NAD Pulses decreased. Overall exam unchanged.  Wound present to the medial aspect of the left first metatarsal head with necrotic tissue.  No pain with calf compression, swelling, warmth, erythema  Assessment: Osteomyelitis left foot, PAD  Plan: At this point I discussed surgery with the patient including partial first ray fixation.  I also discussed with patient's power of attorney, [name].  I discussed the surgery as well as postoperative course Discussed that she is at high risk of further amputation.  We discussed the surgery as well as postoperative course.  Risks and complications were discussed including spread of infection, delayed, nonhealing, proximal potation as well as general risks of surgery.  We will plan on surgery tomorrow.  N.p.o. after midnight.  Celesta Gentile, DPM

## 2021-04-07 NOTE — TOC Initial Note (Signed)
Transition of Care [facility]) - Initial/Assessment Note    Patient Details  Name: Valaree Fresquez MRN: [id number] Date of Birth: [birth year]  Transition of [facility]) CM/SW Contact:    Vinie Sill, LCSW Phone Number: 04/07/2021, 5:26 PM  Clinical Narrative:                 CSW met with patient and her niece,Pam at bedside. CSW introduced self and explained role. Patient confirmed she arrived from [facility] and expects to return once medically stable. Patient receives dialysis TTS. Patient states no questions for concerns at this time.   TOC will continue to follow and assist with discharge planning.   Thurmond Butts, MSW, LCSW Clinical Social Worker    Expected Discharge Plan: Skilled Nursing Facility Barriers to Discharge: Continued Medical Work up   Patient Goals and CMS Choice        Expected Discharge Plan and Services Expected Discharge Plan: [place] In-house Referral: Clinical Social Work     Living arrangements for the past 2 months: [place]                                      Prior Living Arrangements/Services Living arrangements for the past 2 months: [place] Lives with:: Self          Need for Family Participation in Patient Care: Yes (Comment)     Criminal Activity/Legal Involvement Pertinent to Current Situation/Hospitalization: No - Comment as needed  Activities of Daily Living Home Assistive Devices/Equipment: Wheelchair ADL Screening (condition at time of admission) Patient's cognitive ability adequate to safely complete daily activities?: Yes Is the patient deaf or have difficulty hearing?: Yes Does the patient have difficulty seeing, even when wearing glasses/contacts?: Yes Does the patient have difficulty concentrating, remembering, or making decisions?: Yes Patient able to express need for assistance with ADLs?: Yes Does the patient have difficulty dressing or bathing?:  Yes Independently performs ADLs?: No Does the patient have difficulty walking or climbing stairs?: Yes Weakness of Legs: Both Weakness of Arms/Hands: Both  Permission Sought/Granted Permission sought to share information with : Family Supports Permission granted to share information with : Yes, Verbal Permission Granted  Share Information with NAME: Pam Miskel           Emotional Assessment     Affect (typically observed): Pleasant, Appropriate Orientation: : Oriented to Self, Oriented to Place, Oriented to Situation, Oriented to  Time Alcohol / Substance Use: Not Applicable Psych Involvement: No (comment)  Admission diagnosis:  Acute osteomyelitis of left foot ([place]) [M86.172] Acute osteomyelitis ([place]) [M86.10] Ulcer of left foot with other severity ([place]) [L97.528] Patient Active Problem List   Diagnosis Date Noted   Sinus bradycardia 04/07/2021   Hypokalemia 04/05/2021   History of CVA (cerebrovascular accident) 04/04/2021   Acute osteomyelitis of left foot ([place]) 04/04/2021   Chronic respiratory failure ([place]) 04/04/2021   Anemia of chronic kidney failure 04/04/2021   Hyponatremia 04/04/2021   Memory loss 04/04/2021   Pressure injury of skin 03/04/2021   Rectal bleeding 12/27/2019   NSVT (nonsustained ventricular tachycardia) 10/27/2019   Megaloblastic anemia 10/26/2019   Chronic blood loss anemia 10/26/2017   Iron deficiency anemia 02/23/2017   Chronic diastolic CHF (congestive heart failure) ([place]) 10/15/2016   Type 2 diabetes mellitus with nephropathy ([place]) 10/15/2016   ESRD on dialysis ([place]) 10/15/2016   NSTEMI (non-ST elevated myocardial infarction) ([place])  10/03/2015   COPD with chornic respiratory failure on oxygen 2L [place] 06/05/2015   Anemia in chronic kidney disease, Stage IV 06/05/2015   Atrial fibrillation ([place]) 06/05/2015   TIA (transient ischemic attack) 09/02/2014   Gait difficulty 09/02/2014   Other specified transient cerebral ischemias    Stroke with  cerebral ischemia ([place])    Hemispheric carotid artery syndrome    Essential hypertension    PCP:  Monico Blitz, MD Pharmacy:   [facility] [address] [place] [address] Phone: [phone] Fax: [phone]     Social Determinants of Health (SDOH) Interventions    Readmission Risk Interventions Readmission Risk Prevention Plan 12/30/2019 10/27/2019  Transportation Screening Complete Complete  PCP or Specialist Appt within 5-7 Days - Complete  Home Care Screening - Complete  Medication Review (RN CM) - Complete  HRI or Home Care Consult Complete -  Social Work Consult for Recovery Care Planning/Counseling Complete -  Palliative Care Screening Not Complete -  Medication Review (RN Care Manager) Complete -  Some recent data might be hidden

## 2021-04-07 NOTE — Progress Notes (Signed)
Progress Note   Patient: Jody [name] ZOX:[id number] DOB: 08/31/1940 DOA: 04/04/2021     3 DOS: the patient was seen and examined on 04/07/2021   Brief hospital course: 81 year old past medical history significant for ESRD on hemodialysis TTS, diabetes type 2, A-fib, COPD on chronic 2 L of oxygen, diastolic heart failure, anemia of chronic kidney disease, hypertension, history of CVA who presents to the ED with concern of left foot osteomyelitis/PAD.  She was sent from podiatry clinic.  Per patient she has been having left foot problem for about 2 weeks.  Patient has been getting IV antibiotics for the last 2 weeks with hemodialysis as an outpatient.  Culture grew Finegoldia magna.   MRI of the left foot: Show abnormal marrow signal of the head of the first metatarsal and base of the proximal phalanx of the first digit, which in the presence of adjacent deep skin wound is consistent with acute osteomyelitis/septic arthritis.  Deep skin wound about the medial aspect of the first metatarsal head.  Underwent arteriogram with SFA drug coated balloon angioplasty and unsuccessful recannulization of the ATA.  Assessment and Plan: * Acute osteomyelitis of left foot ([place])- (present on admission) Presents with Acute osteomyelitis of left big toe, failed IV abx outpatient. -Has been receiving IV vancomycin and cefepime at dialysis since 03/24/21. Wound culture grew out finegoldia magna. -Continue with Vancomycin, Cefepime and flagyl.  -Dr Jacqualyn Posey recommend Partial first ray amputation.  -ABI: Right and left resting right ankle-brachial index indicate noncompressible right lower extremity arteries.  Right and left toe brachial index is abnormal. -For arteriogram 2/22: Successful drug-coated balloon angioplasty of the superficial femoral artery.  Unsuccessful recanalization of the anterior tibial artery. -For Sx tomorrow.   Sinus bradycardia Asymptomatic.  Hold Cardizem and metoprolol.    Hypokalemia Correction with HD.   Memory loss [place], asked that she be called for medical decisions.  Delirium precautions   B12 358, start supplement   Hyponatremia Likely secondary to renal failure Continue to follow   Anemia of chronic kidney failure Baseline 7-8 No IV iron with infection. On Aranesp.  Received one unit PRBC 2/22. Hb stable at 7.7  Chronic respiratory failure (HCC) Stable on 2 L oxygen.   History of CVA (cerebrovascular accident) On /eliquis, holding eliqus in case of procedure   ESRD on dialysis [facility]) On dialysis TTS. Nephrology following.  Had HD 2/21  Type 2 diabetes mellitus with nephropathy ([place])- (present on admission) a1c of 5.3 in 02/2021. SSI and accuchecks   Chronic diastolic CHF (congestive heart failure) ([place])- (present on admission) Last echo: 06/2020: EF of 45-50% with mildly reduced LVF. Hypokinesis of the inferoseptal, inferolateral and distal inferior walls . Diastolic parameters indeterminate.  Volume management with HD  Discontinue lasix.   Atrial fibrillation ([place])- (present on admission) Rate controlled Hold Eliquis  in case of procedure  Holding cardizem and metoprolol due to bradycardia.   COPD with chornic respiratory failure on oxygen 2L [place]- (present on admission) On chronic 2L oxygen, continue No signs of exacerbation. Continue with albuterol prn   Essential hypertension- (present on admission) Hold  cardizem 120mg  daily and metoprolol due to bradycardia         Subjective:  She was sleepy, wake up answer questions.   Physical Exam: Vitals:   04/07/21 0531 04/07/21 0800 04/07/21 0815 04/07/21 1303  BP:  102/70  (!) 145/99  Pulse:  (!) 50  63  Resp:  10 17 (!) 21  Temp:  97.8 F (  36.6 C)  (!) 97.3 F (36.3 C)  TempSrc:  Oral  Oral  SpO2:  100% 100% 100%  Weight: 81.7 kg     Height:       General; NAD Lung; CTA Abdomen: soft, NT, ND Extremity no edema, left foot: discoloration,  ulcer foot.   Data Reviewed:  CBC and Bmet  Family Communication: Niece over phone  Disposition: Status is: Inpatient Remains inpatient appropriate because: management of foot infection.           Planned Discharge Destination: Skilled nursing facility     Time spent: 35 minutes  Author: Elmarie Shiley, MD 04/07/2021 2:20 PM  For on call review [email or web address].

## 2021-04-07 NOTE — H&P (View-Only) (Signed)
Subjective: 81 year old female admitted for ulceration of the left foot.  Underwent angio yesterday with vascular surgery.  Found to have only peroneal runoff and small vessel disease.  Unfortunatly she is at high risk of proximal limb loss but given the infection we will proceed with partial first ray mutation which is scheduled for tomorrow.  Patient seen in dialysis.  No fevers or chills that she reports.  Objective: AAO x3, NAD Pulses decreased. Overall exam unchanged.  Wound present to the medial aspect of the left first metatarsal head with necrotic tissue.  No pain with calf compression, swelling, warmth, erythema  Assessment: Osteomyelitis left foot, PAD  Plan: At this point I discussed surgery with the patient including partial first ray fixation.  I also discussed with patient's power of attorney, Jody [name].  I discussed the surgery as well as postoperative course Discussed that she is at high risk of further amputation.  We discussed the surgery as well as postoperative course.  Risks and complications were discussed including spread of infection, delayed, nonhealing, proximal potation as well as general risks of surgery.  We will plan on surgery tomorrow.  N.p.o. after midnight.  Celesta Gentile, DPM

## 2021-04-07 NOTE — Progress Notes (Signed)
Dialysis RN tried to call for her for dialysis. Floor RN reports bradycardia to 30's at times. Known A-fib. Was on diltiazem and metoprolol. Dilt stopped yesterday, metoprolol held this morning (did get last night). Electrolytes ok, will wait on HD for now and circle back to her later today.  Veneta Penton, PA-C [employer] Pager [phone]

## 2021-04-07 NOTE — Progress Notes (Addendum)
°  Progress Note    04/07/2021 7:23 AM 1 Day Post-Op  Subjective:  says her back hurts  Afebrile  Vitals:   04/07/21 0300 04/07/21 0400  BP: (!) 88/54 102/85  Pulse: 65 68  Resp: 16 20  Temp:  97.8 F (36.6 C)  SpO2: 100% 96%    Physical Exam: General:  no distress but sleepy. Awakes to voice Lungs:  non labored Incisions:  right groin is soft without hematoma Extremities:  monophasic right DP and faint left peroneal doppler signal   CBC    Component Value Date/Time   WBC 6.2 04/07/2021 0222   RBC 2.25 (L) 04/07/2021 0222   HGB 7.7 (L) 04/07/2021 0222   HCT 24.5 (L) 04/07/2021 0222   PLT 106 (L) 04/07/2021 0222   MCV 108.9 (H) 04/07/2021 0222   MCH 34.2 (H) 04/07/2021 0222   MCHC 31.4 04/07/2021 0222   RDW 21.4 (H) 04/07/2021 0222   LYMPHSABS 0.9 04/04/2021 1245   MONOABS 0.6 04/04/2021 1245   EOSABS 0.1 04/04/2021 1245   BASOSABS 0.1 04/04/2021 1245    BMET    Component Value Date/Time   NA 132 (L) 04/07/2021 0222   K 3.8 04/07/2021 0222   CL 97 (L) 04/07/2021 0222   CO2 27 04/07/2021 0222   GLUCOSE 96 04/07/2021 0222   BUN 26 (H) 04/07/2021 0222   CREATININE 4.25 (H) 04/07/2021 0222   CALCIUM 9.4 04/07/2021 0222   GFRNONAA 10 (L) 04/07/2021 0222   GFRAA 9 (L) 10/27/2019 0112    INR    Component Value Date/Time   INR 1.0 12/27/2019 1327     Intake/Output Summary (Last 24 hours) at 04/07/2021 9622 Last data filed at 04/07/2021 2979 Gross per 24 hour  Intake 515 ml  Output 0 ml  Net 515 ml     Assessment/Plan:  81 y.o. female is s/p:  Angiogram with SFA drug coated balloon angioplasty and unsuccessful recannulization of the ATA  1 Day Post-Op   -pt with  monophasic right DP and faint left peroneal doppler signal. -right groin is soft without hematoma -pt needs heels floated off the bed   Leontine Locket, PA-C [facility] [phone] 04/07/2021 7:23 AM  I have independently interviewed patient and agree with PA  assessment and plan above.  She is optimized from a vascular standpoint with peroneal runoff only.  She will be high risk for more proximal amputation given small vessel disease but certainly attempting first toe amputation first is most reasonable next step.  Delrose Rohwer C. Donzetta Matters, MD [facility] [place] Office: [phone] Pager: [phone]

## 2021-04-07 NOTE — Progress Notes (Signed)
PHARMACIST LIPID MONITORING   Jody [name] is a 81 y.o. female admitted on 04/04/2021 with wound check, ulcer on big toe.  Pharmacy has been consulted to optimize lipid-lowering therapy with the indication of secondary prevention for clinical ASCVD.  Recent Labs:  Lipid Panel (last 6 months):   Lab Results  Component Value Date   CHOL 101 04/07/2021   TRIG 50 04/07/2021   HDL 47 04/07/2021   CHOLHDL 2.1 04/07/2021   VLDL 10 04/07/2021   LDLCALC 44 04/07/2021    Hepatic function panel (last 6 months):   Lab Results  Component Value Date   AST 17 04/04/2021   ALT 10 04/04/2021   ALKPHOS 84 04/04/2021   BILITOT 0.6 04/04/2021    SCr (since admission):   Serum creatinine: 4.25 mg/dL (H) 04/07/21 0222 Estimated creatinine clearance: 11 mL/min (A)  Current therapy and lipid therapy tolerance Current lipid-lowering therapy: atorvastatin 40mg  Previous lipid-lowering therapies (if applicable): atorvastatin 20mg  Documented or reported allergies or intolerances to lipid-lowering therapies (if applicable): none  Assessment:   Patient prefers no changes in lipid-lowering therapy at this time.  Plan:    1.Statin intensity (high intensity recommended for all patients regardless of the LDL):  No statin changes. The patient is already on a high intensity statin.  2.Add ezetimibe (if any one of the following):   Not indicated at this time.  3.Refer to lipid clinic:   No  4.Follow-up with:  Primary care provider - Monico Blitz, MD  5.Follow-up labs after discharge:  No changes in lipid therapy, repeat a lipid panel in one year.      Erin Hearing PharmD., BCPS Clinical Pharmacist 04/07/2021 9:01 AM

## 2021-04-07 NOTE — Assessment & Plan Note (Addendum)
Asymptomatic.  Hold Cardizem  Holder parameter for metoprolol.  Improved.

## 2021-04-08 ENCOUNTER — Encounter (HOSPITAL_COMMUNITY)
Admission: EM | Disposition: A | Discharge status: Skilled Nursing Facility | Payer: Self-pay | Source: Home / Self Care | Attending: Internal Medicine

## 2021-04-08 ENCOUNTER — Inpatient Hospital Stay (HOSPITAL_COMMUNITY): Payer: Medicare Other

## 2021-04-08 ENCOUNTER — Other Ambulatory Visit: Payer: Self-pay

## 2021-04-08 ENCOUNTER — Encounter (HOSPITAL_COMMUNITY): Payer: Self-pay | Admitting: Family Medicine

## 2021-04-08 ENCOUNTER — Inpatient Hospital Stay (HOSPITAL_COMMUNITY): Payer: Medicare Other | Admitting: Anesthesiology

## 2021-04-08 DIAGNOSIS — M869 Osteomyelitis, unspecified: Secondary | ICD-10-CM

## 2021-04-08 DIAGNOSIS — J449 Chronic obstructive pulmonary disease, unspecified: Secondary | ICD-10-CM

## 2021-04-08 DIAGNOSIS — N186 End stage renal disease: Secondary | ICD-10-CM

## 2021-04-08 DIAGNOSIS — Z8673 Personal history of transient ischemic attack (TIA), and cerebral infarction without residual deficits: Secondary | ICD-10-CM

## 2021-04-08 DIAGNOSIS — M86172 Other acute osteomyelitis, left ankle and foot: Secondary | ICD-10-CM

## 2021-04-08 DIAGNOSIS — E1169 Type 2 diabetes mellitus with other specified complication: Secondary | ICD-10-CM

## 2021-04-08 HISTORY — PX: AMPUTATION TOE: SHX6595

## 2021-04-08 LAB — BASIC METABOLIC PANEL
Anion gap: 11 (ref 5–15)
BUN: 20 mg/dL (ref 8–23)
CO2: 24 mmol/L (ref 22–32)
Calcium: 9.3 mg/dL (ref 8.9–10.3)
Chloride: 97 mmol/L — ABNORMAL LOW (ref 98–111)
Creatinine, Ser: 3.32 mg/dL — ABNORMAL HIGH (ref 0.44–1.00)
GFR, Estimated: 13 mL/min — ABNORMAL LOW (ref >60)
Glucose, Bld: 108 mg/dL — ABNORMAL HIGH (ref 70–99)
Potassium: 3.7 mmol/L (ref 3.5–5.1)
Sodium: 132 mmol/L — ABNORMAL LOW (ref 135–145)

## 2021-04-08 LAB — CBC
HCT: 26.5 % — ABNORMAL LOW (ref 36.0–46.0)
Hemoglobin: 8.1 g/dL — ABNORMAL LOW (ref 12.0–15.0)
MCH: 33.2 pg (ref 26.0–34.0)
MCHC: 30.6 g/dL (ref 30.0–36.0)
MCV: 108.6 fL — ABNORMAL HIGH (ref 80.0–100.0)
Platelets: 120 10*3/uL — ABNORMAL LOW (ref 150–400)
RBC: 2.44 MIL/uL — ABNORMAL LOW (ref 3.87–5.11)
RDW: 21.2 % — ABNORMAL HIGH (ref 11.5–15.5)
WBC: 7.1 10*3/uL (ref 4.0–10.5)
nRBC: 0 % (ref 0.0–0.2)

## 2021-04-08 LAB — GLUCOSE, CAPILLARY
Glucose-Capillary: 110 mg/dL — ABNORMAL HIGH (ref 70–99)
Glucose-Capillary: 106 mg/dL — ABNORMAL HIGH (ref 70–99)
Glucose-Capillary: 93 mg/dL (ref 70–99)
Glucose-Capillary: 104 mg/dL — ABNORMAL HIGH (ref 70–99)
Glucose-Capillary: 121 mg/dL — ABNORMAL HIGH (ref 70–99)
Glucose-Capillary: 105 mg/dL — ABNORMAL HIGH (ref 70–99)

## 2021-04-08 SURGERY — AMPUTATION, TOE
Anesthesia: Monitor Anesthesia Care | Site: Toe | Laterality: Left

## 2021-04-08 MED ORDER — OXYCODONE HCL 5 MG/5ML PO SOLN: 5.0000 mg | Freq: Once | ORAL | Status: DC | PRN

## 2021-04-08 MED ORDER — FENTANYL CITRATE (PF) 250 MCG/5ML IJ SOLN
INTRAMUSCULAR | Status: AC
  Filled 2021-04-08: qty 5

## 2021-04-08 MED ORDER — ACETAMINOPHEN 160 MG/5ML PO SOLN: 325.0000 mg | ORAL | Status: DC | PRN

## 2021-04-08 MED ORDER — 0.9 % SODIUM CHLORIDE (POUR BTL) OPTIME
TOPICAL | Status: DC | PRN
  Administered 2021-04-08: 1000 mL

## 2021-04-08 MED ORDER — FENTANYL CITRATE (PF) 100 MCG/2ML IJ SOLN: 25.0000 ug | INTRAMUSCULAR | Status: DC | PRN

## 2021-04-08 MED ORDER — ONDANSETRON HCL 4 MG/2ML IJ SOLN
INTRAMUSCULAR | Status: AC
  Filled 2021-04-08: qty 2

## 2021-04-08 MED ORDER — PHENYLEPHRINE 40 MCG/ML (10ML) SYRINGE FOR IV PUSH (FOR BLOOD PRESSURE SUPPORT)
PREFILLED_SYRINGE | INTRAVENOUS | Status: AC
  Filled 2021-04-08: qty 10

## 2021-04-08 MED ORDER — MEPERIDINE HCL 25 MG/ML IJ SOLN: 6.2500 mg | INTRAMUSCULAR | Status: DC | PRN

## 2021-04-08 MED ORDER — FENTANYL CITRATE (PF) 250 MCG/5ML IJ SOLN
INTRAMUSCULAR | Status: DC | PRN
  Administered 2021-04-08: 25 ug via INTRAVENOUS

## 2021-04-08 MED ORDER — CHLORHEXIDINE GLUCONATE 0.12 % MT SOLN
OROMUCOSAL | Status: AC
  Administered 2021-04-08: 15 mL via OROMUCOSAL
  Filled 2021-04-08: qty 15

## 2021-04-08 MED ORDER — SODIUM CHLORIDE 0.9 % IV SOLN: INTRAVENOUS | Status: DC

## 2021-04-08 MED ORDER — ORAL CARE MOUTH RINSE: 15.0000 mL | Freq: Once | OROMUCOSAL | Status: AC

## 2021-04-08 MED ORDER — LIDOCAINE HCL 2 % IJ SOLN
INTRAMUSCULAR | Status: AC
  Filled 2021-04-08: qty 20

## 2021-04-08 MED ORDER — LIDOCAINE 2% (20 MG/ML) 5 ML SYRINGE
INTRAMUSCULAR | Status: DC | PRN
  Administered 2021-04-08: 40 mg via INTRAVENOUS

## 2021-04-08 MED ORDER — ONDANSETRON HCL 4 MG/2ML IJ SOLN: 4.0000 mg | Freq: Once | INTRAMUSCULAR | Status: DC | PRN

## 2021-04-08 MED ORDER — PROPOFOL 500 MG/50ML IV EMUL
INTRAVENOUS | Status: DC | PRN
  Administered 2021-04-08: 80 ug/kg/min via INTRAVENOUS

## 2021-04-08 MED ORDER — LIDOCAINE 2% (20 MG/ML) 5 ML SYRINGE
INTRAMUSCULAR | Status: AC
  Filled 2021-04-08: qty 5

## 2021-04-08 MED ORDER — LIDOCAINE HCL 2 % IJ SOLN
INTRAMUSCULAR | Status: DC | PRN
  Administered 2021-04-08: 10 mL

## 2021-04-08 MED ORDER — ACETAMINOPHEN 325 MG PO TABS: 325.0000 mg | ORAL_TABLET | ORAL | Status: DC | PRN

## 2021-04-08 MED ORDER — BUPIVACAINE HCL (PF) 0.5 % IJ SOLN
INTRAMUSCULAR | Status: DC | PRN
Start: 2021-04-08 — End: 2021-04-08
  Administered 2021-04-08: 10 mL

## 2021-04-08 MED ORDER — APIXABAN 2.5 MG PO TABS
2.5000 mg | ORAL_TABLET | Freq: Two times a day (BID) | ORAL | Status: DC
  Administered 2021-04-09 – 2021-04-15 (×13): 2.5 mg via ORAL
  Filled 2021-04-08 (×13): qty 1

## 2021-04-08 MED ORDER — SODIUM CHLORIDE 0.9 % IV SOLN
2.0000 g | INTRAVENOUS | Status: AC
  Administered 2021-04-08: 2 g via INTRAVENOUS
  Filled 2021-04-08: qty 2

## 2021-04-08 MED ORDER — CHLORHEXIDINE GLUCONATE CLOTH 2 % EX PADS: 6.0000 | MEDICATED_PAD | Freq: Once | CUTANEOUS | Status: DC

## 2021-04-08 MED ORDER — OXYCODONE HCL 5 MG PO TABS: 5.0000 mg | ORAL_TABLET | Freq: Once | ORAL | Status: DC | PRN

## 2021-04-08 MED ORDER — CHLORHEXIDINE GLUCONATE CLOTH 2 % EX PADS
6.0000 | MEDICATED_PAD | Freq: Once | CUTANEOUS | Status: AC
  Administered 2021-04-08: 6 via TOPICAL

## 2021-04-08 MED ORDER — PHENYLEPHRINE 40 MCG/ML (10ML) SYRINGE FOR IV PUSH (FOR BLOOD PRESSURE SUPPORT)
PREFILLED_SYRINGE | INTRAVENOUS | Status: DC | PRN
  Administered 2021-04-08: 80 ug via INTRAVENOUS
  Administered 2021-04-08 (×2): 120 ug via INTRAVENOUS

## 2021-04-08 MED ORDER — BUPIVACAINE HCL (PF) 0.5 % IJ SOLN
INTRAMUSCULAR | Status: AC
  Filled 2021-04-08: qty 30

## 2021-04-08 MED ORDER — ONDANSETRON HCL 4 MG/2ML IJ SOLN
INTRAMUSCULAR | Status: DC | PRN
Start: 2021-04-08 — End: 2021-04-08
  Administered 2021-04-08: 4 mg via INTRAVENOUS

## 2021-04-08 MED ORDER — CHLORHEXIDINE GLUCONATE 0.12 % MT SOLN: 15.0000 mL | Freq: Once | OROMUCOSAL | Status: AC

## 2021-04-08 MED ORDER — SODIUM CHLORIDE 0.9 % IV SOLN: INTRAVENOUS | Status: DC | PRN

## 2021-04-08 SURGICAL SUPPLY — 35 items
BAG COUNTER SPONGE SURGICOUNT (BAG) ×2 IMPLANT
BAG SPNG CNTER NS LX DISP (BAG) ×1
BLADE LONG MED 31X9 (MISCELLANEOUS) IMPLANT
BNDG CMPR 9X4 STRL LF SNTH (GAUZE/BANDAGES/DRESSINGS) ×1
BNDG CONFORM 2 STRL LF (GAUZE/BANDAGES/DRESSINGS) ×2 IMPLANT
BNDG ELASTIC 3X5.8 VLCR STR LF (GAUZE/BANDAGES/DRESSINGS) ×2 IMPLANT
BNDG ELASTIC 4X5.8 VLCR STR LF (GAUZE/BANDAGES/DRESSINGS) ×1 IMPLANT
BNDG ESMARK 4X9 LF (GAUZE/BANDAGES/DRESSINGS) ×2 IMPLANT
BNDG GAUZE ELAST 4 BULKY (GAUZE/BANDAGES/DRESSINGS) ×2 IMPLANT
CUFF TOURN SGL QUICK 18X4 (TOURNIQUET CUFF) IMPLANT
DRSG EMULSION OIL 3X3 NADH (GAUZE/BANDAGES/DRESSINGS) ×2 IMPLANT
DRSG PAD ABDOMINAL 8X10 ST (GAUZE/BANDAGES/DRESSINGS) ×1 IMPLANT
DRSG XEROFORM 1X8 (GAUZE/BANDAGES/DRESSINGS) ×1 IMPLANT
DURAPREP 26ML APPLICATOR (WOUND CARE) ×2 IMPLANT
ELECT REM PT RETURN 9FT ADLT (ELECTROSURGICAL) ×2
ELECTRODE REM PT RTRN 9FT ADLT (ELECTROSURGICAL) ×1 IMPLANT
GAUZE SPONGE 4X4 12PLY STRL (GAUZE/BANDAGES/DRESSINGS) ×2 IMPLANT
GLOVE SURG ENC MOIS LTX SZ8 (GLOVE) ×4 IMPLANT
GOWN STRL REUS W/ TWL LRG LVL3 (GOWN DISPOSABLE) ×1 IMPLANT
GOWN STRL REUS W/ TWL XL LVL3 (GOWN DISPOSABLE) ×1 IMPLANT
GOWN STRL REUS W/TWL LRG LVL3 (GOWN DISPOSABLE) ×2
GOWN STRL REUS W/TWL XL LVL3 (GOWN DISPOSABLE) ×2
KIT BASIN OR (CUSTOM PROCEDURE TRAY) ×2 IMPLANT
NDL HYPO 25X1 1.5 SAFETY (NEEDLE) ×1 IMPLANT
NEEDLE HYPO 25X1 1.5 SAFETY (NEEDLE) ×2 IMPLANT
NS IRRIG 1000ML POUR BTL (IV SOLUTION) IMPLANT
PACK ORTHO EXTREMITY (CUSTOM PROCEDURE TRAY) ×2 IMPLANT
SUCTION FRAZIER HANDLE 10FR (MISCELLANEOUS) ×1
SUCTION TUBE FRAZIER 10FR DISP (MISCELLANEOUS) ×1 IMPLANT
SUT ETHILON 3 0 PS 1 (SUTURE) ×2 IMPLANT
SUT PROLENE 3 0 PS 2 (SUTURE) ×2 IMPLANT
SYR 10ML LL (SYRINGE) IMPLANT
TUBE CONNECTING 12X1/4 (SUCTIONS) ×2 IMPLANT
UNDERPAD 30X36 HEAVY ABSORB (UNDERPADS AND DIAPERS) ×2 IMPLANT
YANKAUER SUCT BULB TIP NO VENT (SUCTIONS) IMPLANT

## 2021-04-08 NOTE — Anesthesia Postprocedure Evaluation (Signed)
Anesthesia Post Note  Patient: Jody [name]  Procedure(s) Performed: FIRST TOE LEFT FOOT AMPUTATION (Left: Toe)     Patient location during evaluation: PACU Anesthesia Type: MAC Level of consciousness: awake and alert Pain management: pain level controlled Vital Signs Assessment: post-procedure vital signs reviewed and stable Respiratory status: spontaneous breathing, nonlabored ventilation and respiratory function stable Cardiovascular status: blood pressure returned to baseline and stable Postop Assessment: no apparent nausea or vomiting Anesthetic complications: no   No notable events documented.  Last Vitals:  Vitals:   04/08/21 1533 04/08/21 1537  BP: (!) 92/46 (!) 100/51  Pulse: 62 95  Resp: 13 12  Temp: 36.7 C   SpO2: 99% 96%    Last Pain:  Vitals:   04/08/21 1533  TempSrc: Oral  PainSc:                  Pervis Hocking

## 2021-04-08 NOTE — Progress Notes (Signed)
[facility] Progress Note   Subjective:  pt seen in room. She is confused but alert and awake. No specific c/o's.    Objective Vitals:   04/08/21 0353 04/08/21 0801 04/08/21 0919 04/08/21 1048  BP: (!) 106/58 133/69 122/75 118/64  Pulse: 83 89 85 78  Resp: 18 17  14   Temp: 98.8 F (37.1 C) 97.6 F (36.4 C)  98.2 F (36.8 C)  TempSrc: Oral Oral  Oral  SpO2: 98% 95%  97%  Weight: 86.2 kg     Height:       Physical Exam General: Elderly woman, NAD. Nasal O2 in place. Heart: Irreg irregular, no murmur Lungs: CTA anteriorly Abdomen: soft Extremities: No LE edema; L MTP joint with dry wound Dialysis Access: RUE AVF + bruit  Additional Objective Labs: Basic Metabolic Panel: Recent Labs  Lab 04/06/21 0454 04/07/21 0222 04/08/21 0714  NA 131* 132* 132*  K 3.7 3.8 3.7  CL 96* 97* 97*  CO2 25 27 24   GLUCOSE 87 96 108*  BUN 22 26* 20  CREATININE 3.58* 4.25* 3.32*  CALCIUM 9.0 9.4 9.3    Liver Function Tests: Recent Labs  Lab 04/04/21 1245  AST 17  ALT 10  ALKPHOS 84  BILITOT 0.6  PROT 6.8  ALBUMIN 3.1*    CBC: Recent Labs  Lab 04/04/21 1245 04/05/21 0400 04/06/21 0454 04/07/21 0222 04/08/21 0714  WBC 5.4 3.7* 4.0 6.2 7.1  NEUTROABS 3.8  --   --   --   --   HGB 8.3* 7.1* 6.8* 7.7* 8.1*  HCT 27.4* 23.3* 21.8* 24.5* 26.5*  MCV 115.6* 112.6* 112.4* 108.9* 108.6*  PLT 165 141* 111* 106* 120*    Blood Culture    Component Value Date/Time   SDES BLOOD LEFT ANTECUBITAL 04/05/2021 0359   SPECREQUEST  04/05/2021 0359    BOTTLES DRAWN AEROBIC AND ANAEROBIC Blood Culture results may not be optimal due to an excessive volume of blood received in culture bottles   CULT  04/05/2021 0359    NO GROWTH 3 DAYS Performed at [facility], [place] [address], [place], [place] [address]    REPTSTATUS PENDING 04/05/2021 1287   Studies/Results: No results found. Medications:  sodium chloride     sodium chloride     ceFEPime (MAXIPIME) IV      metronidazole 500 mg (04/08/21 0401)   vancomycin 1,000 mg (04/07/21 1807)    (feeding supplement) PROSource Plus  30 mL Oral BID BM   aspirin EC  81 mg Oral Daily   atorvastatin  40 mg Oral Daily   Chlorhexidine Gluconate Cloth  6 each Topical Daily   Chlorhexidine Gluconate Cloth  6 each Topical Daily   Chlorhexidine Gluconate Cloth  6 each Topical Once   clopidogrel  75 mg Oral Q breakfast   darbepoetin (ARANESP) injection - DIALYSIS  200 mcg Intravenous Q Thu-HD   ferrous sulfate  325 mg Oral BID WC   gabapentin  100 mg Oral TID   heparin  5,000 Units Subcutaneous Q8H   insulin aspart  0-6 Units Subcutaneous TID WC   metoprolol tartrate  12.5 mg Oral BID   pramipexole  0.125 mg Oral Daily   sevelamer carbonate  800 mg Oral TID WC   sodium chloride flush  3 mL Intravenous Q12H   sodium chloride flush  3 mL Intravenous Q12H   vitamin B-12  100 mcg Oral Daily    OP HD: TTS [facility] - HBsAb 11 on  05/11/20. 3h 49min   79.5kg     2K/2.5Ca bath  RUE AVF  15g   Hep none - Hep B SAg neg 2/20, COVID neg 2/20 - Calcitriol 1.60mcg PO q HD - Mircera 22mcg IV q 2 weeks - Venofer 50mg  IV weekly - On Vanc 1g and Cefepime 2g since 2/9. Wound Cx at OP clinic: Finegoldia magna   Assessment/Plan:  L foot osteomyelitis: Has been on Vanc/Cefepime since 03/24/21, continued here and Flagyl added. Repeat Blood Cx negative. For toe amputation per ortho. PAD: ABIs abnormal, VVS following. ABIs abnormal, s/p angioplasty to L SFA by [facility] on 2/22.  ESRD: on HD TTS. HD tomorrow.   Hypertension/volume: BP's are wnl, HR's controlled. Diltiazem has been stopped, getting metoprolol bid. Wt's are up but no ^vol on exam.   Anemia: Hgb 7.7, s/p 1U PRBCs on 2/22. No IV iron d/t infection.  SP darbe 200 ug on 2/23. Ordered weekly.   Metabolic bone disease: Ca 10.2 on admit, VDRA on hold. Continue Renvela as binder.  Nutrition:  Alb low (3.1) - continue supplements. A-fib: On metoprolol 12.5 bid, po diltiazem was  dc'd, eliquis dc'd also.  Memory loss - confused today, prob delirium COPD Hx CVA  Kelly Splinter, MD 04/08/2021, 12:09 PM

## 2021-04-08 NOTE — Progress Notes (Signed)
Progress Note   Patient: Jody [name] IZT:[id number] DOB: 1940-10-19 DOA: 04/04/2021     4 DOS: the patient was seen and examined on 04/08/2021   Brief hospital course: 80 year old past medical history significant for ESRD on hemodialysis TTS, diabetes type 2, A-fib, COPD on chronic 2 L of oxygen, diastolic heart failure, anemia of chronic kidney disease, hypertension, history of CVA who presents to the ED with concern of left foot osteomyelitis/PAD.  She was sent from podiatry clinic.  Per patient she has been having left foot problem for about 2 weeks.  Patient has been getting IV antibiotics for the last 2 weeks with hemodialysis as an outpatient.  Culture grew Finegoldia magna.   MRI of the left foot: Show abnormal marrow signal of the head of the first metatarsal and base of the proximal phalanx of the first digit, which in the presence of adjacent deep skin wound is consistent with acute osteomyelitis/septic arthritis.  Deep skin wound about the medial aspect of the first metatarsal head.  Underwent arteriogram with SFA drug coated balloon angioplasty and unsuccessful recannulization of the ATA.  Underwent Left first Toe amputation 2/24.  Assessment and Plan: * Acute osteomyelitis of left foot ([place])- (present on admission) Presents with Acute osteomyelitis of left big toe, failed IV abx outpatient. -Has been receiving IV vancomycin and cefepime at dialysis since 03/24/21. Wound culture grew out finegoldia magna. -Continue with Vancomycin, Cefepime and flagyl.  -Dr Jacqualyn Posey recommend Partial first ray amputation.  -ABI: Right and left resting right ankle-brachial index indicate noncompressible right lower extremity arteries.  Right and left toe brachial index is abnormal. -For arteriogram 2/22: Successful drug-coated balloon angioplasty of the superficial femoral artery.  Unsuccessful recanalization of the anterior tibial artery. -Underwent first toe left foot amputation with/24.   Recommendation is to continue with IV antibiotics for another 24 to 48 hours then transition to oral antibiotics on discharge.  Sinus bradycardia Asymptomatic.  Hold Cardizem  Holder parameter for metoprolol.   Hypokalemia Correction with HD.   Memory loss [place], asked that she be called for medical decisions.  Delirium precautions  B12: 358, started  Supplement.  Hyponatremia Likely secondary to renal failure. Continue to follow.  Anemia of chronic kidney failure Baseline 7-8 No IV iron with infection. On Aranesp.  Received one unit PRBC 2/22. Hb stable at 8.1  Chronic respiratory failure (HCC) Stable on 2 L oxygen.   History of CVA (cerebrovascular accident) On /eliquis,   ESRD on dialysis ([place]) On dialysis TTS. Nephrology following.  Had HD 2/21, and 2/23.  Type 2 diabetes mellitus with nephropathy ([place])- (present on admission) a1c of 5.3 in 02/2021. SSI and accuchecks   Chronic diastolic CHF (congestive heart failure) ([place])- (present on admission) Last echo: 06/2020: EF of 45-50% with mildly reduced LVF. Hypokinesis of the inferoseptal, inferolateral and distal inferior walls . Diastolic parameters indeterminate.  Volume management with HD  Discontinue lasix.   Atrial fibrillation ([place])- (present on admission) Rate controlled. Holding Cardizem and Metoprolol due to Bradycardia.  Ok to resume Eliquis, Discussed with Dr Jacqualyn Posey.   COPD with chornic respiratory failure on oxygen 2L [place]- (present on admission) On chronic 2L oxygen, continue No signs of exacerbation. Continue with albuterol prn   Essential hypertension- (present on admission) Hold  cardizem 120mg  daily and holder parameter for metoprolol due to bradycardia         Subjective:  She is more alert today, I saw her prior to Sx, she denies Dyspnea or pain.  Physical Exam: Vitals:   04/08/21 1445 04/08/21 1515 04/08/21 1533 04/08/21 1537  BP: 93/68 108/73 (!) 92/46 (!) 100/51   Pulse: 68 69 62 95  Resp: 18 12 13 12   Temp: 98 F (36.7 C)  98.1 F (36.7 C)   TempSrc:   Oral   SpO2: 95% 95% 99% 96%  Weight:      Height:       General. NAD Lung; CTA Abdomen fort.  LE extremity; left foot with discoloration and ulceration near first  big toe   Data Reviewed:  CBD and Bmet reviewed,   Family Communication: Care discussed with Niece 2/23.  Disposition: Status is: Inpatient Remains inpatient appropriate because: undergoing left first toe amputation for infection.           Planned Discharge Destination: Skilled nursing facility     Time spent: 45 minutes  Author: Elmarie Shiley, MD 04/08/2021 5:37 PM  For on call review [email or web address].

## 2021-04-08 NOTE — Progress Notes (Signed)
Pharmacy Antibiotic Note  Jody [name] is a 81 y.o. female admitted on 04/04/2021 with  osteomyelitis .  Pharmacy has been consulted for vancomycin and cefepime dosing.  Patient was on vancomycin 1g qHD and cefepime 2g qHD PTA for osteomyelitis. Per nephrology, a foot wound culture on 03/24/21 grew Finegoldia magna.  Cefepime not given post HD yesterday at 1800. Spoke with dayshift RN and entered one time order for a now dose.   Plan: Continue Vancomycin 1g qHD Cefepime 2g qHD. One time 2gm dose now as dose missed yesterday. Trend WBC, temp, renal function  F/U infectious work-up Drug levels as indicated  Height: 5\' 7"  (170.2 cm) Weight: 86.2 kg (190 lb 0.6 oz) IBW/kg (Calculated) : 61.6  Temp (24hrs), Avg:98.2 F (36.8 C), Min:97.3 F (36.3 C), Max:98.8 F (37.1 C)  Recent Labs  Lab 04/04/21 1245 04/04/21 1645 04/05/21 0400 04/06/21 0454 04/07/21 0222 04/08/21 0714  WBC 5.4  --  3.7* 4.0 6.2 7.1  CREATININE 4.65*  --  5.10* 3.58* 4.25* 3.32*  LATICACIDVEN 1.6  --   --   --   --   --   VANCORANDOM  --  22  --   --   --   --      Estimated Creatinine Clearance: 14.5 mL/min (A) (by C-G formula based on SCr of 3.32 mg/dL (H)).    Allergies  Allergen Reactions   Codeine Nausea And Vomiting    Antimicrobials this admission: Vancomycin 2/9 >>  Cefepime 2/9 >>  Flagyl 2/20>>  Dose adjustments this admission: 2/20 Random vancomycin level 22 - continue current dosing  Microbiology results: 2/20 BCx: ngtd 2/9 wound Cx: Finegoldia magna  Thank you for allowing pharmacy to be a part of this patients care.  Sherlon Handing, PharmD, BCPS Please see amion for complete clinical pharmacist phone list 04/08/2021 8:34 AM

## 2021-04-08 NOTE — Care Management Important Message (Signed)
Important Message  Patient Details  [name] MRN: [id number] Date of Birth: Jul 09, 1940   Medicare Important Message Given:  Yes     Adama Ferber Montine Circle 04/08/2021, 3:09 PM

## 2021-04-08 NOTE — Progress Notes (Signed)
Patient returned to 4E from PACU. Vitals taken and stable. Tele placed and CCMD notfied. Assessed left foot dressing. Patient reoriented to unit.  Family at the bedside. Call bell within reach.  [place] N Wyvonne Carda

## 2021-04-08 NOTE — Brief Op Note (Signed)
04/04/2021 - 04/08/2021  2:11 PM  PATIENT:  Jody [name]  81 y.o. female  PRE-OPERATIVE DIAGNOSIS:  OSTEOMYELITIS LEFT FOOT  POST-OPERATIVE DIAGNOSIS:  OSTEOMYELITIS LEFT FOOT  PROCEDURE:  Procedure(s): FIRST TOE LEFT FOOT AMPUTATION (Left)  SURGEON:  Surgeon(s) and Role:    * Trula Slade, DPM - Primary  PHYSICIAN ASSISTANT:   ASSISTANTS: none   ANESTHESIA:   MAC  EBL:  4 mL   BLOOD ADMINISTERED:none  DRAINS: none   LOCAL MEDICATIONS USED:  OTHER 2m lidocaine and marcaine plain  SPECIMEN:  Source of Specimen:  toe for pathology  DISPOSITION OF SPECIMEN:  PATHOLOGY  COUNTS:  YES  TOURNIQUET:  * Missing tourniquet times found for documented tourniquets in log: [id number]*  DICTATION: .DViviann SpareDictation  PLAN OF CARE: Admit to inpatient   PATIENT DISPOSITION:  PACU - hemodynamically stable.   Delay start of Pharmacological VTE agent (>24hrs) due to surgical blood loss or risk of bleeding: no  Intraoperative findings: Ulcer with underlying osteomyelitis of the 1st metatarsal head. Underwent partial first ray amputation. Will keep for 24-48 hours for IV antibiotics and likely discharge with a week of oral antibiotics.

## 2021-04-08 NOTE — Progress Notes (Signed)
°  Progress Note    04/08/2021 8:04 AM 2 Days Post-Op  Subjective:  confused this morning. Listing multiple numbers asking to call her niece and asking to get dressed to go home and be put in her wheel chair   Vitals:   04/08/21 0353 04/08/21 0801  BP: (!) 106/58 133/69  Pulse: 83 89  Resp: 18 17  Temp: 98.8 F (37.1 C) 97.6 F (36.4 C)  SpO2: 98% 95%   Physical Exam: Cardiac:  regular Lungs:  non labored Incisions:  right groin soft without hematoma. Mild oozing from access site. Dry gauze applied Extremities:  Left leg with monophasic doppler peroneal signal Abdomen:  obese, soft Neurologic: alert  CBC    Component Value Date/Time   WBC 7.1 04/08/2021 0714   RBC 2.44 (L) 04/08/2021 0714   HGB 8.1 (L) 04/08/2021 0714   HCT 26.5 (L) 04/08/2021 0714   PLT 120 (L) 04/08/2021 0714   MCV 108.6 (H) 04/08/2021 0714   MCH 33.2 04/08/2021 0714   MCHC 30.6 04/08/2021 0714   RDW 21.2 (H) 04/08/2021 0714   LYMPHSABS 0.9 04/04/2021 1245   MONOABS 0.6 04/04/2021 1245   EOSABS 0.1 04/04/2021 1245   BASOSABS 0.1 04/04/2021 1245    BMET    Component Value Date/Time   NA 132 (L) 04/08/2021 0714   K 3.7 04/08/2021 0714   CL 97 (L) 04/08/2021 0714   CO2 24 04/08/2021 0714   GLUCOSE 108 (H) 04/08/2021 0714   BUN 20 04/08/2021 0714   CREATININE 3.32 (H) 04/08/2021 0714   CALCIUM 9.3 04/08/2021 0714   GFRNONAA 13 (L) 04/08/2021 0714   GFRAA 9 (L) 10/27/2019 0112    INR    Component Value Date/Time   INR 1.0 12/27/2019 1327     Intake/Output Summary (Last 24 hours) at 04/08/2021 0804 Last data filed at 04/08/2021 0359 Gross per 24 hour  Intake 600 ml  Output 836 ml  Net -236 ml     Assessment/Plan:  81 y.o. female is s/p Angiogram with SFA drug coated balloon angioplasty and unsuccessful recannulization of the ATA  2 Days Post-Op   Right groin femoral access site with mild oozing. Dry dressings applied. Continue to monitor Left leg with monophasic Peroneal  signal Optimized from vascular standpoint Continue Aspirin, statin, Plavix Patient scheduled for OR today with podiatry for partial 1st ray amputation   Karoline Caldwell, PA-C [facility] [phone] 04/08/2021 8:04 AM

## 2021-04-08 NOTE — Transfer of Care (Signed)
Immediate Anesthesia Transfer of Care Note  Patient: Jody [name]  Procedure(s) Performed: FIRST TOE LEFT FOOT AMPUTATION (Left: Toe)  Patient Location: PACU  Anesthesia Type:MAC  Level of Consciousness: awake, drowsy and responds to stimulation  Airway & Oxygen Therapy: Patient Spontanous Breathing and Patient connected to nasal cannula oxygen  Post-op Assessment: Report given to RN and Post -op Vital signs reviewed and stable  Post vital signs: Reviewed and stable  Last Vitals:  Vitals Value Taken Time  BP 102/71 04/08/21 1417  Temp    Pulse    Resp 12 04/08/21 1418  SpO2    Vitals shown include unvalidated device data.  Last Pain:  Vitals:   04/08/21 1218  TempSrc: Oral  PainSc:       Patients Stated Pain Goal: 0 (70/17/79 3903)  Complications: No notable events documented.

## 2021-04-08 NOTE — Progress Notes (Signed)
OT Cancellation Note  Patient Details [name] MRN: [id number] DOB: [birth year]   Cancelled Treatment:    Reason Eval/Treat Not Completed: Patient at procedure or test/ unavailable (Surgery. Will return as schedule allows.)  [place], OTR/L Acute Rehab Pager: [phone] Office: [phone] 04/08/2021, 12:21 PM

## 2021-04-08 NOTE — Op Note (Signed)
04/04/2021 - 04/08/2021  2:11 PM  PATIENT:  Jody [name]  81 y.o. female  PRE-OPERATIVE DIAGNOSIS:  OSTEOMYELITIS LEFT FOOT  POST-OPERATIVE DIAGNOSIS:  OSTEOMYELITIS LEFT FOOT  PROCEDURE:  Procedure(s): FIRST TOE LEFT FOOT AMPUTATION (Left)  SURGEON:  Surgeon(s) and Role:    * Trula Slade, DPM - Primary  PHYSICIAN ASSISTANT:   ASSISTANTS: none   ANESTHESIA:   MAC  EBL:  4 mL   BLOOD ADMINISTERED:none  DRAINS: none   LOCAL MEDICATIONS USED:  OTHER 18m lidocaine and marcaine plain  SPECIMEN:  Source of Specimen:  toe for pathology  DISPOSITION OF SPECIMEN:  PATHOLOGY  COUNTS:  YES  TOURNIQUET:  * Missing tourniquet times found for documented tourniquets in log: [id number]*  DICTATION: .DViviann SpareDictation  PLAN OF CARE: Admit to inpatient   PATIENT DISPOSITION:  PACU - hemodynamically stable.   Delay start of Pharmacological VTE agent (>24hrs) due to surgical blood loss or risk of bleeding: no  Indications for surgery: 81year old female presents the ablation to the office and found to have ulceration on the left first metatarsal head medially.  X-rays were concerning for osteomyelitis and given concern for infection as well as PAD she was admitted to the hospital.  Underwent angio with vascular surgery.  MRI confirmed osteomyelitis.  I discussed with the patient as well as her niece who is the power of attorney partial first ray potation.  Understands that she is at high risk of limb loss.  All alternatives risks and complications were discussed.  No promises or guarantees given second procedure.  All questions answered the best my ability.  Procedure in detail: The patient was both verbally and visually identified by myself and nursing staff and anesthesia staff preoperatively.  She is then transferred to the operating room via stretcher and placed on the operative table in supine position.  After adequate plane of anesthesia was obtained timeout was performed and  then 10 cc of lidocaine, Marcaine plain was infiltrated in a regional block fashion.  Tourniquet was applied but of note this was not inflated during the procedure.  The left lower extremity was then scrubbed, prepped, draped in normal sterile fashion.  Timeout was performed.  An additional 10 cc of lidocaine, Marcaine plain was infiltrated.  The incision was planned on the first metatarsal phalangeal joint.  Incision was made from skin to bone with a #10 blade scalpel circumferentially around the first MPJ.  The MPJ was identified the toe was disarticulated.  Small mount of purulence noted coming from the joint.  The first metatarsal head was identified and there was found to be osteomyelitis of the first metatarsal head.  There is brown in color and soft.  I then resected the distal half of the first metatarsal with a sagittal saw.  The remaining bone appeared to be viable.  It was hard in nature, white in color.  No further purulence identified.  Debrided soft tissue.  The wound was copiously irrigated with saline and hemostasis achieved.  The incision was then closed with 3-0 nylon without tension.  Xeroform was applied followed by dry sterile dressing.  She was awoken from anesthesia and found to tolerate the procedure well any complications.  Postoperative course: Recommend remain inpatient for 24-48 hrs on IV antibiotics and transition to oral antibiotics upon discharge.

## 2021-04-08 NOTE — Interval H&P Note (Signed)
History and Physical Interval Note:  04/08/2021 1:06 PM  Jody [name]  has presented today for surgery, with the diagnosis of OSTEOMYELITIS LEFT FOOT.  The various methods of treatment have been discussed with the patient and family. After consideration of risks, benefits and other options for treatment, the patient has consented to  Procedure(s): FIRST TOE LEFT FOOT AMPUTATION (Left) as a surgical intervention.  The patient's history has been reviewed, patient examined, no change in status, stable for surgery.  I have reviewed the patient's chart and labs.  Questions were answered to the patient's satisfaction.     Trula Slade

## 2021-04-08 NOTE — Anesthesia Preprocedure Evaluation (Addendum)
Anesthesia Evaluation  Patient identified by MRN, date of birth, ID band Patient awake    Reviewed: Allergy & Precautions, NPO status , Patient's Chart, lab work & pertinent test results  History of Anesthesia Complications (+) history of anesthetic complications (headeche for 2 days after sx)  Airway Mallampati: II  TM Distance: >3 FB Neck ROM: Full    Dental  (+) Edentulous Upper, Edentulous Lower   Pulmonary COPD (uses oxygen as needed at home),  COPD inhaler and oxygen dependent, former smoker,    Pulmonary exam normal breath sounds clear to auscultation       Cardiovascular Exercise Tolerance: Poor hypertension, Pt. on medications + Past MI and +CHF  + dysrhythmias Atrial Fibrillation  Rhythm:Irregular Rate:Normal  MYOVIEW 22  No evidence of ischemia.  EF is mildly reduced on echo.  No change in therapy.  No need for cath.       Neuro/Psych TIACVA, Residual Symptoms    GI/Hepatic GERD  Medicated,Lower GI bleeding   Endo/Other  diabetes, Type 2  Renal/GU ESRF and DialysisRenal disease     Musculoskeletal  (+) Arthritis ,   Abdominal   Peds  Hematology  (+) Blood dyscrasia, anemia ,   Anesthesia Other Findings Left ventricle: The cavity size was normal. Wall thickness was  increased in a pattern of mild LVH. Systolic function was normal.  The estimated ejection fraction was in the range of 60% to 65%.  Wall motion was normal; there were no regional wall motion  abnormalities. Features are consistent with a pseudonormal left  ventricular filling pattern, with concomitant abnormal relaxation  and increased filling pressure (grade 2 diastolic dysfunction).  - Aortic valve: Mildly calcified annulus. Trileaflet; mildly  calcified leaflets. There was trivial regurgitation. Mean  gradient (S): 10 mm Hg. Valve area (VTI): 1.92 cm^2.  - Mitral valve: Mildly calcified annulus. There was trivial   regurgitation.    Reproductive/Obstetrics                            Anesthesia Physical  Anesthesia Plan  ASA: 4  Anesthesia Plan: MAC   Post-op Pain Management:    Induction: Intravenous  PONV Risk Score and Plan: 3 and Ondansetron and Propofol infusion  Airway Management Planned: Simple Face Mask and Natural Airway  Additional Equipment: None  Intra-op Plan:   Post-operative Plan: Extubation in OR  Informed Consent: I have reviewed the patients History and Physical, chart, labs and discussed the procedure including the risks, benefits and alternatives for the proposed anesthesia with the patient or authorized representative who has indicated his/her understanding and acceptance.     Dental advisory given  Plan Discussed with: CRNA and Anesthesiologist  Anesthesia Plan Comments: (HPI: Jody [name] is a 81 y.o. female with medical history significant of  ESRD on HD TTS, T2DM, atrial fibrillation, COPD on chronic oxygen at 2L,  diastolic CHF, anemia in chronic kidney disease, HTN, hx of CVA who presented to ED for concerns for left foot osteomyelitis/PAD.  )     Anesthesia Quick Evaluation

## 2021-04-09 ENCOUNTER — Encounter (HOSPITAL_COMMUNITY): Payer: Self-pay | Admitting: Podiatry

## 2021-04-09 DIAGNOSIS — G9341 Metabolic encephalopathy: Secondary | ICD-10-CM

## 2021-04-09 LAB — CBC
HCT: 24 % — ABNORMAL LOW (ref 36.0–46.0)
Hemoglobin: 7.5 g/dL — ABNORMAL LOW (ref 12.0–15.0)
MCH: 34.7 pg — ABNORMAL HIGH (ref 26.0–34.0)
MCHC: 31.3 g/dL (ref 30.0–36.0)
MCV: 111.1 fL — ABNORMAL HIGH (ref 80.0–100.0)
Platelets: 112 10*3/uL — ABNORMAL LOW (ref 150–400)
RBC: 2.16 MIL/uL — ABNORMAL LOW (ref 3.87–5.11)
RDW: 20.8 % — ABNORMAL HIGH (ref 11.5–15.5)
WBC: 6 10*3/uL (ref 4.0–10.5)
nRBC: 0 % (ref 0.0–0.2)

## 2021-04-09 LAB — RENAL FUNCTION PANEL
Albumin: 2.5 g/dL — ABNORMAL LOW (ref 3.5–5.0)
Anion gap: 10 (ref 5–15)
BUN: 28 mg/dL — ABNORMAL HIGH (ref 8–23)
CO2: 26 mmol/L (ref 22–32)
Calcium: 9.5 mg/dL (ref 8.9–10.3)
Chloride: 97 mmol/L — ABNORMAL LOW (ref 98–111)
Creatinine, Ser: 4.21 mg/dL — ABNORMAL HIGH (ref 0.44–1.00)
GFR, Estimated: 10 mL/min — ABNORMAL LOW (ref >60)
Glucose, Bld: 95 mg/dL (ref 70–99)
Phosphorus: 4.3 mg/dL (ref 2.5–4.6)
Potassium: 4.1 mmol/L (ref 3.5–5.1)
Sodium: 133 mmol/L — ABNORMAL LOW (ref 135–145)

## 2021-04-09 LAB — GLUCOSE, CAPILLARY
Glucose-Capillary: 101 mg/dL — ABNORMAL HIGH (ref 70–99)
Glucose-Capillary: 99 mg/dL (ref 70–99)
Glucose-Capillary: 134 mg/dL — ABNORMAL HIGH (ref 70–99)
Glucose-Capillary: 164 mg/dL — ABNORMAL HIGH (ref 70–99)

## 2021-04-09 LAB — CULTURE, BLOOD (ROUTINE X 2)
Culture: NO GROWTH
Special Requests: ADEQUATE

## 2021-04-09 NOTE — Progress Notes (Addendum)
[facility] Progress Note   Subjective:  pt seen in room. Feels better today. Had toe amp yesterday.     Objective Vitals:   04/09/21 1051 04/09/21 1150 04/09/21 1705 04/09/21 2043  BP: (!) 126/59 109/64 117/68 (!) 99/55  Pulse: 80 82 72 77  Resp: 16 20 20 14   Temp: (!) 97.4 F (36.3 C) 98.1 F (36.7 C) (!) 97.4 F (36.3 C) 98 F (36.7 C)  TempSrc: Oral  Oral Oral  SpO2: 100% 100% 100% 100%  Weight: 84.3 kg     Height:       Physical Exam General: Elderly woman, NAD. Nasal O2 in place. Heart: Irreg irregular, no murmur Lungs: CTA anteriorly Abdomen: soft Extremities: No LE edema; L foot wrapped Dialysis Access: RUE AVF + bruit  Additional Objective Labs: Basic Metabolic Panel: Recent Labs  Lab 04/07/21 0222 04/08/21 0714 04/09/21 0712  NA 132* 132* 133*  K 3.8 3.7 4.1  CL 97* 97* 97*  CO2 27 24 26   GLUCOSE 96 108* 95  BUN 26* 20 28*  CREATININE 4.25* 3.32* 4.21*  CALCIUM 9.4 9.3 9.5  PHOS  --   --  4.3    Liver Function Tests: Recent Labs  Lab 04/04/21 1245 04/09/21 0712  AST 17  --   ALT 10  --   ALKPHOS 84  --   BILITOT 0.6  --   PROT 6.8  --   ALBUMIN 3.1* 2.5*    CBC: Recent Labs  Lab 04/04/21 1245 04/05/21 0400 04/06/21 0454 04/07/21 0222 04/08/21 0714 04/09/21 0712  WBC 5.4 3.7* 4.0 6.2 7.1 6.0  NEUTROABS 3.8  --   --   --   --   --   HGB 8.3* 7.1* 6.8* 7.7* 8.1* 7.5*  HCT 27.4* 23.3* 21.8* 24.5* 26.5* 24.0*  MCV 115.6* 112.6* 112.4* 108.9* 108.6* 111.1*  PLT 165 141* 111* 106* 120* 112*    Blood Culture    Component Value Date/Time   SDES BLOOD LEFT ANTECUBITAL 04/05/2021 0359   SPECREQUEST  04/05/2021 0359    BOTTLES DRAWN AEROBIC AND ANAEROBIC Blood Culture results may not be optimal due to an excessive volume of blood received in culture bottles   CULT  04/05/2021 0359    NO GROWTH 4 DAYS Performed at [facility], [place] [address] [place], [place] [address]    REPTSTATUS PENDING 04/05/2021 5176    Studies/Results: DG Foot 2 Views Left  Result Date: 04/08/2021 CLINICAL DATA:  Postop left first digit amputation. EXAM: LEFT FOOT - 2 VIEW COMPARISON:  04/04/2021 FINDINGS: Interval amputation of the great toe to the level of the proximal shaft of the first metatarsal. There is a sharp postoperative amputation site. Old healed distal fifth metatarsal fracture unchanged. No acute fracture or dislocation. Mild posterior tibiotalar joint space narrowing. Vascular calcifications are noted. IMPRESSION: Interval amputation of the great toe at the level of the proximal shaft of the first metatarsal. Electronically Signed   By: Yvonne Kendall M.D.   On: 04/08/2021 15:06   Medications:  sodium chloride     sodium chloride     ceFEPime (MAXIPIME) IV     metronidazole 500 mg (04/09/21 1737)   vancomycin Stopped (04/09/21 1103)    (feeding supplement) PROSource Plus  30 mL Oral BID BM   apixaban  2.5 mg Oral BID   aspirin EC  81 mg Oral Daily   atorvastatin  40 mg Oral Daily   Chlorhexidine Gluconate Cloth  6  each Topical Daily   Chlorhexidine Gluconate Cloth  6 each Topical Daily   clopidogrel  75 mg Oral Q breakfast   darbepoetin (ARANESP) injection - DIALYSIS  200 mcg Intravenous Q Thu-HD   ferrous sulfate  325 mg Oral BID WC   gabapentin  100 mg Oral TID   insulin aspart  0-6 Units Subcutaneous TID WC   metoprolol tartrate  12.5 mg Oral BID   pramipexole  0.125 mg Oral Daily   sevelamer carbonate  800 mg Oral TID WC   sodium chloride flush  3 mL Intravenous Q12H   sodium chloride flush  3 mL Intravenous Q12H   vitamin B-12  100 mcg Oral Daily    OP HD: TTS [facility] - [facility]BsAb 11 on 05/11/20. 3h 12min   79.5kg     2K/2.5Ca bath  RUE AVF  15g   Hep none - Hep B SAg neg 2/20, COVID neg 2/20 - Calcitriol 1.70mcg PO q HD - Mircera 223mcg IV q 2 weeks - Venofer 50mg  IV weekly - On Vanc 1g and Cefepime 2g since 2/9. Wound Cx at OP clinic: Finegoldia magna   Assessment/Plan:  L foot  osteomyelitis: Has been on Vanc/Cefepime since 03/24/21, continued here and Flagyl added. Repeat Blood Cx negative. For toe amputation per ortho. PAD: ABIs abnormal, [facility] following. ABIs abnormal, s/p angioplasty to L SFA by [facility] on 2/22.  ESRD: on HD TTS. Next HD 2/28.   Hypertension/volume: BP's are wnl, HR's controlled. Diltiazem has been stopped, getting metoprolol bid. Wt's are up but no ^vol on exam.   Anemia: Hgb 7.7, s/p 1U PRBCs on 2/22. No IV iron d/t infection.  SP darbe 200 ug on 2/23. Ordered weekly.   Metabolic bone disease: Ca 10.2 on admit, VDRA on hold. Continue Renvela as binder.  Nutrition:  Alb low (3.1) - continue supplements. A-fib: On metoprolol 12.5 bid, po diltiazem was dc'd, eliquis dc'd also.  Memory loss - confused today, prob delirium COPD Hx CVA  Kelly Splinter, MD 04/09/2021, 11:05 PM

## 2021-04-09 NOTE — Progress Notes (Signed)
Subjective: POD # 1 s/p left partial first ray amputation.  She was in dialysis.  She states that she is having some discomfort of the foot.  Denies any fevers or chills.  No other concerns.  Objective: AAO x3, NAD Dressing clean, dry, intact without any strikethrough.  CRT less than 3 seconds to remaining digits.   No pain with calf compression, swelling, warmth, erythema  Assessment: POD # 1 s/p left partial first amputation  Plan: We will plan on changing the bandage tomorrow.  For now continue IV antibiotics.  From podiatry standpoint likely will be able to be discharged pending medical clearance as well as return to rehab.  Unfortunately she is at high risk of limb loss given her circulation going to keep a close monitoring of the incision.  Celesta Gentile, DPM

## 2021-04-09 NOTE — Plan of Care (Signed)
°  Problem: Education: Goal: Knowledge of disease or condition will improve 04/09/2021 0747 by Lasandra Beech, RN Outcome: Progressing 04/09/2021 0747 by Lasandra Beech, RN Outcome: Progressing Goal: Understanding of medication regimen will improve 04/09/2021 0747 by Lasandra Beech, RN Outcome: Progressing 04/09/2021 0747 by Lasandra Beech, RN Outcome: Progressing Goal: Individualized Educational Video(s) 04/09/2021 0747 by Lasandra Beech, RN Outcome: Progressing 04/09/2021 0747 by Lasandra Beech, RN Outcome: Progressing

## 2021-04-09 NOTE — Progress Notes (Signed)
OT Cancellation Note  Patient Details [name] MRN: [id number] DOB: December 12, 1940   Cancelled Treatment:    Reason Eval/Treat Not Completed: Patient at procedure or test/ unavailable (HD)  [name] 04/09/2021, 8:04 AM [name], OTR/L Acute Rehabilitation Services Pager: [phone] Office: [phone]

## 2021-04-09 NOTE — Assessment & Plan Note (Addendum)
She has been more confuse. Suspect hospital delirium, medication, infection.  Delirium precaution.  Ammonia mildly elevated at 43. Received lactulose.   Reduce gabapentin to daily.  ABG; PCO2: 56, PH compensated.  BIPAP at HS and with naps. Didn't tolerated. She is more alert.  Her mental status fluctuates. Per nurse she was alert and conversant  2/27 afternoon. 3/01: she is alert, oriented, conversant. Stable for discharge.

## 2021-04-09 NOTE — Plan of Care (Signed)
  Problem: Education: Goal: Knowledge of disease or condition will improve Outcome: Progressing Goal: Understanding of medication regimen will improve Outcome: Progressing Goal: Individualized Educational Video(s) Outcome: Progressing   

## 2021-04-09 NOTE — Progress Notes (Addendum)
Progress Note   Patient: Jody [name] ZOX:[id number] DOB: 07/06/1940 DOA: 04/04/2021     5 DOS: the patient was seen and examined on 04/09/2021   Brief hospital course: 81 year old past medical history significant for ESRD on hemodialysis TTS, diabetes type 2, A-fib, COPD on chronic 2 L of oxygen, diastolic heart failure, anemia of chronic kidney disease, hypertension, history of CVA who presents to the ED with concern of left foot osteomyelitis/PAD.  She was sent from podiatry clinic.  Per patient she has been having left foot problem for about 2 weeks.  Patient has been getting IV antibiotics for the last 2 weeks with hemodialysis as an outpatient.  Culture grew Finegoldia magna.   MRI of the left foot: Show abnormal marrow signal of the head of the first metatarsal and base of the proximal phalanx of the first digit, which in the presence of adjacent deep skin wound is consistent with acute osteomyelitis/septic arthritis.  Deep skin wound about the medial aspect of the first metatarsal head.  Underwent arteriogram with SFA drug coated balloon angioplasty and unsuccessful recannulization of the ATA.  Underwent Left first Toe amputation 2/24.  Assessment and Plan: * Acute osteomyelitis of left foot ([place])- (present on admission) Presents with Acute osteomyelitis of left big toe, failed IV abx outpatient. -Has been receiving IV vancomycin and cefepime at dialysis since 03/24/21. -Wound culture grew out finegoldia magna. -Continue with Vancomycin, Cefepime and flagyl.  -Dr Jacqualyn Posey recommend Partial first ray amputation.  -ABI: Right and left resting right ankle-brachial index indicate noncompressible right lower extremity arteries.  Right and left toe brachial index is abnormal. -For arteriogram 2/22: Successful drug-coated balloon angioplasty of the superficial femoral artery.  Unsuccessful recanalization of the anterior tibial artery. -Underwent first toe left foot amputation 2/24.   Recommendation is to continue with IV antibiotics for another 24 to 48 hours then transition to oral antibiotics on discharge.  Acute metabolic encephalopathy She has been more confuse. Suspect hospital delirium, medication.  Delirium precaution.   Sinus bradycardia Asymptomatic.  Hold Cardizem  Holder parameter for metoprolol.  Improved.   Hypokalemia Correction with HD.   Memory loss Jody [name], asked that she be called for medical decisions.  Delirium precautions  B12: 358, started  Supplement.  Hyponatremia Likely secondary to renal failure. Continue to follow.  Anemia of chronic kidney failure Baseline 7-8 No IV iron with infection. On Aranesp.  Received one unit PRBC 2/22. Hb down to 7.5 monitor.   Chronic respiratory failure (HCC) Stable on 2 L oxygen.   History of CVA (cerebrovascular accident) On /eliquis,   ESRD on dialysis ([place]) On dialysis TTS. Nephrology following.  Had HD 2/21, and 2/23.  Type 2 diabetes mellitus with nephropathy ([place])- (present on admission) a1c of 5.3 in 02/2021. SSI and accuchecks   Chronic diastolic CHF (congestive heart failure) ([place])- (present on admission) Last echo: 06/2020: EF of 45-50% with mildly reduced LVF. Hypokinesis of the inferoseptal, inferolateral and distal inferior walls . Diastolic parameters indeterminate.  Volume management with HD  Discontinue lasix.   Atrial fibrillation ([place])- (present on admission) Rate controlled. Holding Cardizem and Metoprolol due to Bradycardia.  Ok to resume Eliquis, Discussed with Dr Jacqualyn Posey.  Back on Eliquis.   COPD with chornic respiratory failure on oxygen 2L [place]- (present on admission) On chronic 2L oxygen, continue No signs of exacerbation. Continue with albuterol prn   Essential hypertension- (present on admission) Hold  cardizem 120mg  daily and holder parameter for metoprolol due to bradycardia  Subjective:  She is confuse, asking to be taken to  [place]. She just came from HD>   Physical Exam: Vitals:   04/09/21 1000 04/09/21 1030 04/09/21 1051 04/09/21 1150  BP: (!) 113/50 (!) 111/57 (!) 126/59 109/64  Pulse: 84 70 80 82  Resp: 15 12 16 20   Temp:   (!) 97.4 F (36.3 C) 98.1 F (36.7 C)  TempSrc:   Oral   SpO2:   100% 100%  Weight:   84.3 kg   Height:       General; NAD Lung; CTA Abdomen; Soft, NT, ND  Data Reviewed:  Cbc, bmet  Family Communication: Care discussed with Niece 2/25  Disposition: Status is: Inpatient Remains inpatient appropriate because: post SX on IV antibiotics, Back [facility].           Planned Discharge Destination: Skilled nursing facility     Time spent: 45 minutes  Author: Elmarie Shiley, MD 04/09/2021 4:47 PM  For on call review [email or web address].

## 2021-04-09 NOTE — Plan of Care (Signed)
°  Problem: Education: Goal: Knowledge of disease or condition will improve 04/09/2021 0748 by Lasandra Beech, RN Outcome: Progressing 04/09/2021 0747 by Lasandra Beech, RN Outcome: Progressing 04/09/2021 0747 by Lasandra Beech, RN Outcome: Progressing Goal: Understanding of medication regimen will improve 04/09/2021 0748 by Lasandra Beech, RN Outcome: Progressing 04/09/2021 0747 by Lasandra Beech, RN Outcome: Progressing 04/09/2021 0747 by Lasandra Beech, RN Outcome: Progressing Goal: Individualized Educational Video(s) 04/09/2021 0748 by Lasandra Beech, RN Outcome: Progressing 04/09/2021 0747 by Lasandra Beech, RN Outcome: Progressing 04/09/2021 0747 by Lasandra Beech, RN Outcome: Progressing

## 2021-04-10 ENCOUNTER — Encounter (HOSPITAL_COMMUNITY): Payer: Medicare Other

## 2021-04-10 ENCOUNTER — Inpatient Hospital Stay (HOSPITAL_COMMUNITY): Payer: Medicare Other

## 2021-04-10 DIAGNOSIS — R609 Edema, unspecified: Secondary | ICD-10-CM

## 2021-04-10 DIAGNOSIS — M79605 Pain in left leg: Secondary | ICD-10-CM

## 2021-04-10 DIAGNOSIS — M79606 Pain in leg, unspecified: Secondary | ICD-10-CM

## 2021-04-10 LAB — GLUCOSE, CAPILLARY
Glucose-Capillary: 98 mg/dL (ref 70–99)
Glucose-Capillary: 116 mg/dL — ABNORMAL HIGH (ref 70–99)
Glucose-Capillary: 136 mg/dL — ABNORMAL HIGH (ref 70–99)
Glucose-Capillary: 108 mg/dL — ABNORMAL HIGH (ref 70–99)

## 2021-04-10 LAB — CULTURE, BLOOD (ROUTINE X 2): Culture: NO GROWTH

## 2021-04-10 LAB — AMMONIA: Ammonia: 43 umol/L — ABNORMAL HIGH (ref 9–35)

## 2021-04-10 MED ORDER — LACTULOSE 10 GM/15ML PO SOLN
20.0000 g | Freq: Two times a day (BID) | ORAL | Status: DC
  Administered 2021-04-10 – 2021-04-11 (×4): 20 g via ORAL
  Filled 2021-04-10 (×4): qty 30

## 2021-04-10 MED ORDER — ACETAMINOPHEN 500 MG PO TABS
500.0000 mg | ORAL_TABLET | Freq: Three times a day (TID) | ORAL | Status: DC
  Administered 2021-04-10 – 2021-04-15 (×16): 500 mg via ORAL
  Filled 2021-04-10 (×16): qty 1

## 2021-04-10 NOTE — Progress Notes (Signed)
VASCULAR LAB    Left lower extremity venous duplex has been performed.  See CV proc for preliminary results.   Jibri Schriefer, RVT 04/10/2021, 6:25 PM

## 2021-04-10 NOTE — Evaluation (Signed)
Occupational Therapy Evaluation Patient Details [name] MRN: [id number] DOB: [birth year] Today's Date: 04/10/2021   History of Present Illness 81 y.o. female [facility] hospital on [facility]21 with concerns for osteomyelitis of L foot. S/p angiogram with SFA drug coated balloon angioplasty and unsuccessful recannulization of the ATA 2/22. S/p L first ray amp on 2/24. PMH includes ESRD, DMII, Afib, COPD, CHF, HTN, CVA.   Clinical Impression   PTA, pt was at SNF for rehab after recent fall at home. Pt self reporting that she use to be "able to do everything my self. I was cooking and everything." Per chart review, pt requiring assistance for ADLs and using a w/c at SNF. Pt currently requiring with decreased balance, strength, activity tolerance, and cognition. Pt requiring Max-Total A for bathing, dressing, and toileting. Pt performing lateral scoot to recliner with Max A+2; placing lift pad in recliner for nursing upon return to bed. Pt also presenting with poor grasp strength and UE coordination. Pt would benefit from further acute OT to facilitate safe dc. Recommend dc to SNF for further OT to optimize safety, independence with ADLs, and return to PLOF.      Recommendations for follow up therapy are one component of a multi-disciplinary discharge planning process, led by the attending physician.  Recommendations may be updated based on patient status, additional functional criteria and insurance authorization.   Follow Up Recommendations  Skilled nursing-short term rehab (<3 hours/day)    Assistance Recommended at Discharge Frequent or constant Supervision/Assistance  Patient can return home with the following Two people to help with walking and/or transfers;Two people to help with bathing/dressing/bathroom    Functional Status Assessment  Patient has had a recent decline in their functional status and demonstrates the ability to make significant improvements in function in a  reasonable and predictable amount of time.  Equipment Recommendations  Other (comment) (Defer to next venue)    Recommendations for Other Services PT consult     Precautions / Restrictions Precautions Precautions: Fall;Other (comment) Precaution Comments: monitor BP and HR (low)      Mobility Bed Mobility Overal bed mobility: Needs Assistance Bed Mobility: Supine to Sit     Supine to sit: Mod assist, HOB elevated     General bed mobility comments: Mod A to bring hips towards EOB with use of bed pad    Transfers Overall transfer level: Needs assistance   Transfers: Bed to chair/wheelchair/BSC            Lateral/Scoot Transfers: Max assist General transfer comment: Max A for lateral scoot with R foot blocked. Poor sequencing, command following, and endurance      Balance Overall balance assessment: Needs assistance Sitting-balance support: No upper extremity supported, Feet supported Sitting balance-Leahy Scale: Fair         Standing balance comment: Defer for safety                           ADL either performed or assessed with clinical judgement   ADL Overall ADL's : Needs assistance/impaired Eating/Feeding: Minimal assistance;Sitting Eating/Feeding Details (indicate cue type and reason): Difficulty maintaing grasp at fork/spoon. Will bring build up handle Grooming: Minimal assistance;Sitting   Upper Body Bathing: Maximal assistance;Sitting   Lower Body Bathing: Total assistance;Sitting/lateral leans;Bed level   Upper Body Dressing : Maximal assistance;Sitting   Lower Body Dressing: Total assistance;Bed level   Toilet Transfer: Maximal assistance (lateral scoot to drop arm recliner) Toilet Transfer Details (indicate  cue type and reason): Unable to perform sit<>Stand           General ADL Comments: Pt performing lateral scoot to reclienr with Max A. Poor following of commands and fatigues quickly. Placing lift pad under hips for  nursing later     Vision Baseline Vision/History: 1 Wears glasses       Perception     Praxis      Pertinent Vitals/Pain Pain Assessment Pain Assessment: Faces Faces Pain Scale: Hurts little more Pain Location: Generalized Pain Descriptors / Indicators: Discomfort, Grimacing, Guarding Pain Intervention(s): Monitored during session, Limited activity within patient's tolerance, Repositioned     Hand Dominance     Extremity/Trunk Assessment Upper Extremity Assessment Upper Extremity Assessment: Generalized weakness;RUE deficits/detail;LUE deficits/detail RUE Deficits / Details: Decreased grasp strength. Also noting jerky movement and pt "dropping" her arms. RUE Coordination: decreased fine motor;decreased gross motor LUE Deficits / Details: Decreased grasp strength. Also noting jerky movement and pt "dropping" her arms. LUE Coordination: decreased fine motor;decreased gross motor   Lower Extremity Assessment Lower Extremity Assessment: Defer to PT evaluation   Cervical / Trunk Assessment Cervical / Trunk Assessment: Kyphotic   Communication Communication Communication: No difficulties   Cognition Arousal/Alertness: Awake/alert Behavior During Therapy: WFL for tasks assessed/performed Overall Cognitive Status: No family/caregiver present to determine baseline cognitive functioning                                 General Comments: Pt requiring increased time to follow simple, one-step commands. Able to state her name and DOB as well as that she had amputation sx a few days ago. Pt reporting "I am just so tired and weak." Orienting to place as pt reporting she is at [place] asked by MD at end of session, pt recalling orienation information and able to state she is at [place]  BP soft but stable    Exercises     Shoulder Instructions      Home Living Family/patient expects to be discharged to:: Skilled nursing facility                                  Additional Comments: Pt went to SNF around 03/07/21, but prior to this was living alone with an aide coming 5x/week and occasional assistance from nieces and nephew. Information from chart review      Prior Functioning/Environment Prior Level of Function : History of Falls (last six months);Needs assist             Mobility Comments: Prior to hospitalization and going to SNF in January 2023, pt was ambulating household distances mod I with RW ADLs Comments: Prior to hospitalization and going to SNF in January 2023, pt was getting assistance from an aide M-F for transportation, cleaning, etc. Pt was doing bird baths at sink.        OT Problem List: Decreased strength;Decreased range of motion;Decreased activity tolerance;Impaired balance (sitting and/or standing);Decreased knowledge of use of DME or AE;Decreased knowledge of precautions;Pain      OT Treatment/Interventions: Self-care/ADL training;Therapeutic exercise;Energy conservation;DME and/or AE instruction;Therapeutic activities;Patient/family education    OT Goals(Current goals can be found in the care plan section) Acute Rehab OT Goals Patient Stated Goal: Get stronger OT Goal Formulation: With patient Time For Goal Achievement: 04/24/21 Potential to Achieve Goals: Fair  OT Frequency: Min 2X/week    Co-evaluation              AM-PAC OT "6 Clicks" Daily Activity     Outcome Measure Help from another person eating meals?: A Little Help from another person taking care of personal grooming?: A Little Help from another person toileting, which includes using toliet, bedpan, or urinal?: Total Help from another person bathing (including washing, rinsing, drying)?: A Lot Help from another person to put on and taking off regular upper body clothing?: A Lot Help from another person to put on and taking off regular lower body clothing?: Total 6 Click Score: 12   End of Session  Equipment Utilized During Treatment: Gait belt Nurse Communication: Mobility status  Activity Tolerance: Patient limited by fatigue Patient left: in chair;with call bell/phone within reach;with chair alarm set  OT Visit Diagnosis: Unsteadiness on feet (R26.81);Other abnormalities of gait and mobility (R26.89);Muscle weakness (generalized) (M62.81)                Time: 8466-5993 OT Time Calculation (min): 44 min Charges:  OT General Charges $OT Visit: 1 Visit OT Evaluation $OT Eval Moderate Complexity: 1 Mod OT Treatments $Self Care/Home Management : 23-37 mins  Tira Lafferty MSOT, OTR/L Acute Rehab Office: [place] 04/10/2021, 8:56 AM

## 2021-04-10 NOTE — Progress Notes (Unsigned)
Cardiology Office Note   Date:  04/10/2021   ID:  Jody [name], DOB [birth year], MRN [id number]  PCP:  Monico Blitz, MD  Cardiologist:   Minus Breeding, MD   No chief complaint on file.      History of Present Illness: Jody [name] is a 81 y.o. female who presents for follow up of atrial fib, chronic diastolic HF and NSVT.  She was previously seen by Dr. Bronson Ing.   She has dialysis dependent renal failure.  She lives by herself but she has an aide that comes.  Her niece is her healthcare power of attorney .  Since I last saw her she was again in the emergency room in September twice. She had shoulder pain but there was no objective evidence of ischemia and it was not thought to be an acute coronary syndrome.   Since I last saw her ***  *** I questioned her about her ER visits 1 of which was for shoulder pain and another 1 for shortness of breath she says it was because her legs hurt.  Not entirely clear to me what is going on when she presented to the ER but it does not sound like an acute cardiac issue.  It is also clear on talking to her that she does not take her medicines necessarily as prescribed.  She takes Eliquis sometimes.  The beta-blocker seems to have fallen off the list but her heart rate and blood pressure seem to be well controlled.  It may be that these are being managed at dialysis.  She is not having any new shortness of breath, PND or orthopnea.  She gets around with a rolling walker.  She does not really notice her heart racing or skipping.  She had no presyncope or syncope.  Past Medical History:  Diagnosis Date   Anemia in chronic kidney disease (CODE) 06/05/2015   Arthritis    "bad in my legs" (12/07/2014)   Chronic atrial fibrillation ([place]) 06/05/2015   Chronic back pain    "dr said my spine is crooked"   Chronic bronchitis ([place])    "get it q yr" (12/07/2014)   CKD stage 4 due to type 2 diabetes mellitus ([place]) 0/62/6948   Complication of anesthesia     headache for 2 days after surgery in May 2019   COPD (chronic obstructive pulmonary disease) ([place])    Gait difficulty 09/02/2014   Hypercholesterolemia    Hypertension    Neuropathy 2015   in legs   NSVT (nonsustained ventricular tachycardia)    Sinus brady-tachy syndrome ([place])    Stroke ([place]) 05/2014   denies residual on 12/07/2014   Type II diabetes mellitus ([place])     Past Surgical History:  Procedure Laterality Date   ABDOMINAL AORTOGRAM W/LOWER EXTREMITY Left 04/06/2021   Procedure: ABDOMINAL AORTOGRAM W/LOWER EXTREMITY;  Surgeon: Broadus John, MD;  Location: [facility];  Service: Cardiovascular;  Laterality: Left;   ABDOMINAL HYSTERECTOMY  ~ 1970   AMPUTATION TOE Left 04/08/2021   Procedure: FIRST TOE LEFT FOOT AMPUTATION;  Surgeon: Trula Slade, DPM;  Location: [facility];  Service: Podiatry;  Laterality: Left;   APPENDECTOMY  ~ [place] Right 07/06/2017   Procedure: RIGHT RADIOCEPHALIC ARTERIOVENOUS FISTULA creation;  Surgeon: Conrad [place], MD;  Location: [facility];  Service: Vascular;  Laterality: Right;   [place] Right 09/17/2017   Procedure: SECOND STAGE BASILIC VEIN TRANSPOSITION RIGHT ARM;  Surgeon: Curt Jews  F, MD;  Location: [facility];  Service: Vascular;  Laterality: Right;   COLONOSCOPY N/A 10/28/2017   Procedure: COLONOSCOPY;  Surgeon: Rogene Houston, MD;  [facility];  Service: Endoscopy;  Laterality: N/A;   COLONOSCOPY WITH PROPOFOL N/A 12/29/2019   Procedure: COLONOSCOPY WITH PROPOFOL;  Surgeon: Eloise Harman, DO;  [facility];  Service: Endoscopy;  Laterality: N/A;   ESOPHAGOGASTRODUODENOSCOPY (EGD) WITH PROPOFOL N/A 10/31/2017   Procedure: ESOPHAGOGASTRODUODENOSCOPY (EGD) WITH PROPOFOL;  Surgeon: Rogene Houston, MD;  [facility];  Service: Endoscopy;  Laterality: N/A;   JOINT REPLACEMENT     PERIPHERAL VASCULAR BALLOON ANGIOPLASTY Left 04/06/2021   Procedure: PERIPHERAL VASCULAR  BALLOON ANGIOPLASTY;  Surgeon: Broadus John, MD;  Location: [facility];  Service: Cardiovascular;  Laterality: Left;  left sfa succesful [place] AT Unsuccesful   TOTAL KNEE ARTHROPLASTY Right ~ [place]  ~ 1970   "9# in my stomach"     No current facility-administered medications for this visit.   No current outpatient medications on file.   Facility-Administered Medications Ordered in Other Visits  Medication Dose Route Frequency Provider Last Rate Last Admin   (feeding supplement) PROSource Plus liquid 30 mL  30 mL Oral BID BM Loren Racer, PA-C   30 mL at 04/10/21 1017   0.9 %  sodium chloride infusion  250 mL Intravenous PRN Orma Flaming, MD       0.9 %  sodium chloride infusion  250 mL Intravenous PRN Broadus John, MD       acetaminophen (TYLENOL) tablet 650 mg  650 mg Oral Q6H PRN Orma Flaming, MD   650 mg at 04/10/21 1547   Or   acetaminophen (TYLENOL) suppository 650 mg  650 mg Rectal Q6H PRN Orma Flaming, MD       acetaminophen (TYLENOL) tablet 500 mg  500 mg Oral TID Regalado, Belkys A, MD   500 mg at 04/10/21 1014   albuterol (PROVENTIL) (2.5 MG/3ML) 0.083% nebulizer solution 0.63 mg  0.63 mg Nebulization Q6H PRN Orma Flaming, MD       apixaban Arne [place]) tablet 2.5 mg  2.5 mg Oral BID Regalado, Belkys A, MD   2.5 mg at 04/10/21 1014   aspirin EC tablet 81 mg  81 mg Oral Daily Broadus John, MD   81 mg at 04/10/21 1014   atorvastatin (LIPITOR) tablet 40 mg  40 mg Oral Daily Broadus John, MD   40 mg at 04/10/21 1015   ceFEPIme (MAXIPIME) 2 g in sodium chloride 0.9 % 100 mL IVPB  2 g Intravenous Q T,Th,Sa-HD Lyndee Leo, RPH       Chlorhexidine Gluconate Cloth 2 % PADS 6 each  6 each Topical Daily Orma Flaming, MD   6 each at 04/07/21 1004   Chlorhexidine Gluconate Cloth 2 % PADS 6 each  6 each Topical Daily Regalado, Belkys A, MD   6 each at 04/10/21 1019   clopidogrel (PLAVIX) tablet 75 mg  75 mg Oral Q breakfast Broadus John, MD   75 mg at 04/10/21 1017   Darbepoetin Alfa (ARANESP) injection 200 mcg  200 mcg Intravenous Q Thu-HD Loren Racer, PA-C   200 mcg at 04/07/21 1653   ferrous sulfate tablet 325 mg  325 mg Oral BID WC Orma Flaming, MD   325 mg at 04/10/21 1546   gabapentin (NEURONTIN) capsule 100 mg  100 mg Oral TID Orma Flaming,  MD   100 mg at 04/10/21 1546   hydrALAZINE (APRESOLINE) injection 5 mg  5 mg Intravenous Q20 Min PRN Broadus John, MD       insulin aspart (novoLOG) injection 0-6 Units  0-6 Units Subcutaneous TID WC Orma Flaming, MD       lactulose (CHRONULAC) 10 GM/15ML solution 20 g  20 g Oral BID Regalado, Belkys A, MD   20 g at 04/10/21 1546   metoprolol tartrate (LOPRESSOR) tablet 12.5 mg  12.5 mg Oral BID Regalado, Belkys A, MD   12.5 mg at 04/09/21 2129   metroNIDAZOLE (FLAGYL) IVPB 500 mg  500 mg Intravenous Q12H Orma Flaming, MD 100 mL/hr at 04/10/21 1557 500 mg at 04/10/21 1557   ondansetron (ZOFRAN) injection 4 mg  4 mg Intravenous Q6H PRN Broadus John, MD       pramipexole (MIRAPEX) tablet 0.125 mg  0.125 mg Oral Daily Orma Flaming, MD   0.125 mg at 04/10/21 1017   sevelamer carbonate (RENVELA) tablet 800 mg  800 mg Oral TID WC Orma Flaming, MD   800 mg at 04/10/21 1546   sodium chloride flush (NS) 0.9 % injection 3 mL  3 mL Intravenous Q12H Orma Flaming, MD   3 mL at 04/10/21 1020   sodium chloride flush (NS) 0.9 % injection 3 mL  3 mL Intravenous PRN Orma Flaming, MD       sodium chloride flush (NS) 0.9 % injection 3 mL  3 mL Intravenous Q12H Broadus John, MD   3 mL at 04/10/21 1020   sodium chloride flush (NS) 0.9 % injection 3 mL  3 mL Intravenous PRN Broadus John, MD       vancomycin (VANCOCIN) IVPB 1000 mg/200 mL premix  1,000 mg Intravenous Q T,Th,Sa-HD Pauletta Browns, [place] at 04/09/21 1103   vitamin B-12 (CYANOCOBALAMIN) tablet 100 mcg  100 mcg Oral Daily Regalado, Belkys A, MD   100 mcg at 04/10/21 1016    Allergies:    Codeine    ROS:  Please see the history of present illness.   Otherwise, review of systems are positive for ***.   All other systems are reviewed and negative.    PHYSICAL EXAM: VS:  There were no vitals taken for this visit. , BMI There is no height or weight on file to calculate BMI. GENERAL:  Well appearing NECK:  No jugular venous distention, waveform within normal limits, carotid upstroke brisk and symmetric, no bruits, no thyromegaly LUNGS:  Clear to auscultation bilaterally CHEST:  Unremarkable HEART:  PMI not displaced or sustained,S1 and S2 within normal limits, no S3, no S4, no clicks, no rubs, *** murmurs ABD:  Flat, positive bowel sounds normal in frequency in pitch, no bruits, no rebound, no guarding, no midline pulsatile mass, no hepatomegaly, no splenomegaly EXT:  2 plus pulses throughout, no edema, no cyanosis no clubbing      ***GEN:  No distress NECK:  No jugular venous distention at 90 degrees, waveform within normal limits, carotid upstroke brisk and symmetric, no bruits, no thyromegaly LYMPHATICS:  No cervical adenopathy LUNGS:  Clear to auscultation bilaterally BACK:  No CVA tenderness CHEST:  Unremarkable HEART:  S1 and S2 within normal limits, no S3, no S4, no clicks, no rubs, no murmurs ABD:  Positive bowel sounds normal in frequency in pitch, no bruits, no rebound, no guarding, unable to assess midline mass or bruit with the patient seated. EXT:  2 plus pulses throughout,  mild edema, no cyanosis no clubbing SKIN:  No rashes no nodules NEURO:  Cranial nerves II through XII grossly intact, motor grossly intact throughout PSYCH:  Cognitively intact, oriented to person place and time   EKG:  EKG *** ordered today. ***  Recent Labs: 03/03/2021: B Natriuretic Peptide 1,411.0 04/04/2021: ALT 10 04/07/2021: Magnesium 2.2 04/09/2021: BUN 28; Creatinine, Ser 4.21; Hemoglobin 7.5; Platelets 112; Potassium 4.1; Sodium 133    Lipid Panel    Component Value  Date/Time   CHOL 101 04/07/2021 0222   TRIG 50 04/07/2021 0222   HDL 47 04/07/2021 0222   CHOLHDL 2.1 04/07/2021 0222   VLDL 10 04/07/2021 0222   LDLCALC 44 04/07/2021 0222      Wt Readings from Last 3 Encounters:  04/10/21 192 lb 7.4 oz (87.3 kg)  03/05/21 181 lb 14.1 oz (82.5 kg)  03/03/21 140 lb (63.5 kg)     Other studies Reviewed: Additional studies/ records that were reviewed today include:  ***. Review of the above records demonstrates:  Please see elsewhere in the note.     ASSESSMENT AND PLAN:  Persistent atrial fibrillation:    ***  We had a long conversation about her blood thinner.  She will continue on Eliquis.  She has no contraindication.  She does have some chronic anemia probably related to her renal disease and no evidence of active bleeding.  No change in therapy is indicated.   Chronic diastolic heart failure:   Her volume is managed with dialysis.   *** No change in therapy.   History of nonsustained ventricular tachycardia:    ***  She had no ischemia.  She has a mildly reduced ejection fraction.  She does not have any symptoms.  No change in therapy.  Cardiomyopathy: She does have a mildly reduced ejection fraction.  ***  I reviewed this with her.  There is no evidence of ischemia.  I do not think she would tolerate med titration or necessarily take the meds as prescribed.  I will leave her on the meds as listed.  Current medicines are reviewed at length with the patient today.  The patient does not have concerns regarding medicines.  The following changes have been made: ***   Labs/ tests ordered today include:   ***  No orders of the defined types were placed in this encounter.     Disposition:   FU with me in *** months   Signed, Minus Breeding, MD  04/10/2021 9:14 PM    [place]

## 2021-04-10 NOTE — Progress Notes (Addendum)
Progress Note   Patient: Jody [name] WJX:[id number] DOB: 01/14/1941 DOA: 04/04/2021     6 DOS: the patient was seen and examined on 04/10/2021   Brief hospital course: 81 year old past medical history significant for ESRD on hemodialysis TTS, diabetes type 2, A-fib, COPD on chronic 2 L of oxygen, diastolic heart failure, anemia of chronic kidney disease, hypertension, history of CVA who presents to the ED with concern of left foot osteomyelitis/PAD.  She was sent from podiatry clinic.  Per patient she has been having left foot problem for about 2 weeks.  Patient has been getting IV antibiotics for the last 2 weeks with hemodialysis as an outpatient.  Culture grew Finegoldia magna.   MRI of the left foot: Show abnormal marrow signal of the head of the first metatarsal and base of the proximal phalanx of the first digit, which in the presence of adjacent deep skin wound is consistent with acute osteomyelitis/septic arthritis.  Deep skin wound about the medial aspect of the first metatarsal head.  Underwent arteriogram with SFA drug coated balloon angioplasty and unsuccessful recannulization of the ATA.  Underwent Left first Toe amputation 2/24. Awaiting SNF  Assessment and Plan: * Acute osteomyelitis of left foot ([place] II)- (present on admission) Presents with Acute osteomyelitis of left big toe, failed IV abx outpatient. -Has been receiving IV vancomycin and cefepime at dialysis since 03/24/21. -Wound culture grew out finegoldia magna. -Continue with Vancomycin, Cefepime and flagyl.  -Dr Jacqualyn Posey recommend Partial first ray amputation.  -ABI: Right and left resting right ankle-brachial index indicate noncompressible right lower extremity arteries.  Right and left toe brachial index is abnormal. -For arteriogram 2/22: Successful drug-coated balloon angioplasty of the superficial femoral artery. Unsuccessful recanalization of the anterior tibial artery. -Underwent first toe left foot amputation 2/24.   Recommendation is to continue with IV antibiotics for another 24 to 48 hours then transition to oral antibiotics on discharge.  Leg pain Complaints of leg pain. Likely related to Sx. Plan to proceed with Doppler.   Acute metabolic encephalopathy She has been more confuse. Suspect hospital delirium, medication.  Delirium precaution.  Ammonia mildly elevated at 43. Started on lactulose.   Sinus bradycardia Asymptomatic.  Hold Cardizem  Holder parameter for metoprolol.  Improved.   Hypokalemia Correction with HD.   Memory loss [place], asked that she be called for medical decisions.  Delirium precautions  B12: 358, started  Supplement.  Hyponatremia Likely secondary to renal failure. Continue to follow.  Anemia of chronic kidney failure Baseline 7-8 No IV iron with infection. On Aranesp.  Received one unit PRBC 2/22. Hb down to 7.5 monitor.   Chronic respiratory failure ([facility]) Stable on 2 L oxygen.   History of CVA (cerebrovascular accident) On /eliquis,   ESRD on dialysis ([place]) On dialysis TTS. Nephrology following.  Continue with HD>   Type 2 diabetes mellitus with nephropathy ([place])- (present on admission) a1c of 5.3 in 02/2021. SSI   Chronic diastolic CHF (congestive heart failure) ([place])- (present on admission) Last echo: 06/2020: EF of 45-50% with mildly reduced LVF. Hypokinesis of the inferoseptal, inferolateral and distal inferior walls . Diastolic parameters indeterminate.  Volume management with HD  Discontinue lasix.   Atrial fibrillation ([place])- (present on admission) Rate controlled. Holding Cardizem and Metoprolol due to Bradycardia.  Continue with Eliquis.   COPD with chornic respiratory failure on oxygen 2L [place]- (present on admission) On chronic 2L oxygen, continue No signs of exacerbation. Continue with albuterol prn   Essential hypertension- (present on  admission) Hold  cardizem 120mg  daily and holder parameter for metoprolol due to  bradycardia         Subjective: she is sitting recliner. She is less confuse.   Physical Exam: Vitals:   04/09/21 2359 04/10/21 0420 04/10/21 0759 04/10/21 1204  BP: 117/81 108/77 94/79 120/80  Pulse: 73 78 89 75  Resp: 20 18 16 18   Temp: 98 F (36.7 C) 98.4 F (36.9 C) 97.6 F (36.4 C) (!) 97.5 F (36.4 C)  TempSrc: Oral Oral Oral Oral  SpO2: 100% 94% 96% 99%  Weight:  87.3 kg    Height:       General; NAD Lungs; CTA Abdomen; soft, nt Extremities; Left foot with dressing.   Data Reviewed:  No labs today.   Family Communication: Niece over phone 2/25  Disposition: Status is: Inpatient Remains inpatient appropriate because: awaiting SNF          Planned Discharge Destination: Skilled nursing facility     Time spent: 45 minutes  Author: Elmarie Shiley, MD 04/10/2021 2:47 PM  For on call review [email or web address].

## 2021-04-10 NOTE — Progress Notes (Signed)
Subjective: POD # 2 s/p left partial first ray amputation.  States has been having pain to her foot into her leg since the surgery.  Denies any fevers or chills.  Objective: AAO x3, NAD Dressing clean, dry, intact without any strikethrough.  Dressing was changed today.  Sutures intact with any dehiscence.  Mild edema.  No drainage or pus noted.  No significant cellulitis at this time.  No pain with calf compression, swelling, warmth, erythema       Assessment: POD # 2 s/p left partial first amputation  Plan: Dressing was changed today.  Xeroform was applied followed by dressing.  We will plan on changing the dressing Tuesday or Wednesday if still in the hospital.  Continue IV antibiotics.  I did order venous duplex to rule out DVT given the leg pain.  From a podiatry standpoint she is able to be discharged once able.  Celesta Gentile, DPM

## 2021-04-10 NOTE — Progress Notes (Signed)
[facility] Progress Note   Subjective:  pt seen in room. Feels better today. Had toe amp yesterday.     Objective Vitals:   04/10/21 0420 04/10/21 0759 04/10/21 1204 04/10/21 1558  BP: 108/77 94/79 120/80 94/68  Pulse: 78 89 75 73  Resp: 18 16 18 13   Temp: 98.4 F (36.9 C) 97.6 F (36.4 C) (!) 97.5 F (36.4 C) (!) 97.1 F (36.2 C)  TempSrc: Oral Oral Oral Axillary  SpO2: 94% 96% 99% 98%  Weight: 87.3 kg     Height:       Physical Exam General: Elderly woman, NAD. Nasal O2 in place. Heart: Irreg irregular, no murmur Lungs: CTA anteriorly Abdomen: soft Extremities: No LE edema; L foot wrapped Dialysis Access: RUE AVF + bruit  Additional Objective Labs: Basic Metabolic Panel: Recent Labs  Lab 04/07/21 0222 04/08/21 0714 04/09/21 0712  NA 132* 132* 133*  K 3.8 3.7 4.1  CL 97* 97* 97*  CO2 27 24 26   GLUCOSE 96 108* 95  BUN 26* 20 28*  CREATININE 4.25* 3.32* 4.21*  CALCIUM 9.4 9.3 9.5  PHOS  --   --  4.3    Liver Function Tests: Recent Labs  Lab 04/04/21 1245 04/09/21 0712  AST 17  --   ALT 10  --   ALKPHOS 84  --   BILITOT 0.6  --   PROT 6.8  --   ALBUMIN 3.1* 2.5*    CBC: Recent Labs  Lab 04/04/21 1245 04/05/21 0400 04/06/21 0454 04/07/21 0222 04/08/21 0714 04/09/21 0712  WBC 5.4 3.7* 4.0 6.2 7.1 6.0  NEUTROABS 3.8  --   --   --   --   --   HGB 8.3* 7.1* 6.8* 7.7* 8.1* 7.5*  HCT 27.4* 23.3* 21.8* 24.5* 26.5* 24.0*  MCV 115.6* 112.6* 112.4* 108.9* 108.6* 111.1*  PLT 165 141* 111* 106* 120* 112*    Blood Culture    Component Value Date/Time   SDES BLOOD LEFT ANTECUBITAL 04/05/2021 0359   SPECREQUEST  04/05/2021 0359    BOTTLES DRAWN AEROBIC AND ANAEROBIC Blood Culture results may not be optimal due to an excessive volume of blood received in culture bottles   CULT  04/05/2021 0359    NO GROWTH 5 DAYS Performed at [facility], [place] [address], [place], [place] [address]    REPTSTATUS 04/10/2021 FINAL 04/05/2021  0359   Studies/Results: No results found. Medications:  sodium chloride     sodium chloride     ceFEPime (MAXIPIME) IV     metronidazole 500 mg (04/10/21 1557)   vancomycin Stopped (04/09/21 1103)    (feeding supplement) PROSource Plus  30 mL Oral BID BM   acetaminophen  500 mg Oral TID   apixaban  2.5 mg Oral BID   aspirin EC  81 mg Oral Daily   atorvastatin  40 mg Oral Daily   Chlorhexidine Gluconate Cloth  6 each Topical Daily   Chlorhexidine Gluconate Cloth  6 each Topical Daily   clopidogrel  75 mg Oral Q breakfast   darbepoetin (ARANESP) injection - DIALYSIS  200 mcg Intravenous Q Thu-HD   ferrous sulfate  325 mg Oral BID WC   gabapentin  100 mg Oral TID   insulin aspart  0-6 Units Subcutaneous TID WC   lactulose  20 g Oral BID   metoprolol tartrate  12.5 mg Oral BID   pramipexole  0.125 mg Oral Daily   sevelamer carbonate  800 mg Oral TID  WC   sodium chloride flush  3 mL Intravenous Q12H   sodium chloride flush  3 mL Intravenous Q12H   vitamin B-12  100 mcg Oral Daily    OP HD: TTS [facility] - HBsAb 11 on 05/11/20. 3h 60min   79.5kg     2K/2.5Ca bath  RUE AVF  15g   Hep none - Hep B SAg neg 2/20, COVID neg 2/20 - Calcitriol 1.50mcg PO q HD - Mircera 274mcg IV q 2 weeks - Venofer 50mg  IV weekly - On Vanc 1g and Cefepime 2g since 2/9. Wound Cx at OP clinic: Finegoldia magna   Assessment/Plan:  L foot osteomyelitis: IV abx broad-spec since admit on 2/20. Afebrile. BCxs negative. SP L great toe amp on 2/24.  PAD: ABIs abnormal, s/p angioplasty to L SFA by [facility] on 2/22.  ESRD: on HD TTS. Next HD 2/28.   Hypertension/volume: BP's are wnl, HR's controlled. Diltiazem stopped, getting metoprolol bid. Wt's appear inaccurate, up 7kg but not vol overloaded by exam and BP's soft.   Anemia: Hgb 7.7, s/p 1U PRBCs on 2/22. No IV iron d/t infection.  SP darbe 200 ug on 2/23. Ordered weekly.   Metabolic bone disease: Ca 10.2 on admit, VDRA on hold. Continue Renvela as binder.   Nutrition:  Alb low (3.1) - continue supplements. A-fib: On metoprolol 12.5 bid, po diltiazem and eliquis dc'd Memory loss - confused, prob delirium, seems better today COPD Hx CVA  Kelly Splinter, MD 04/10/2021, 4:56 PM

## 2021-04-10 NOTE — Assessment & Plan Note (Addendum)
Complaints of leg pain. Likely related to Sx.  Doppler negative for DVT

## 2021-04-11 DIAGNOSIS — I739 Peripheral vascular disease, unspecified: Secondary | ICD-10-CM

## 2021-04-11 DIAGNOSIS — M79605 Pain in left leg: Secondary | ICD-10-CM

## 2021-04-11 DIAGNOSIS — M86172 Other acute osteomyelitis, left ankle and foot: Secondary | ICD-10-CM

## 2021-04-11 LAB — CBC
HCT: 25.6 % — ABNORMAL LOW (ref 36.0–46.0)
Hemoglobin: 8.2 g/dL — ABNORMAL LOW (ref 12.0–15.0)
MCH: 35.2 pg — ABNORMAL HIGH (ref 26.0–34.0)
MCHC: 32 g/dL (ref 30.0–36.0)
MCV: 109.9 fL — ABNORMAL HIGH (ref 80.0–100.0)
Platelets: 132 10*3/uL — ABNORMAL LOW (ref 150–400)
RBC: 2.33 MIL/uL — ABNORMAL LOW (ref 3.87–5.11)
RDW: 20.3 % — ABNORMAL HIGH (ref 11.5–15.5)
WBC: 7.2 10*3/uL (ref 4.0–10.5)
nRBC: 0.6 % — ABNORMAL HIGH (ref 0.0–0.2)

## 2021-04-11 LAB — BASIC METABOLIC PANEL
Anion gap: 11 (ref 5–15)
BUN: 25 mg/dL — ABNORMAL HIGH (ref 8–23)
CO2: 24 mmol/L (ref 22–32)
Calcium: 9.7 mg/dL (ref 8.9–10.3)
Chloride: 98 mmol/L (ref 98–111)
Creatinine, Ser: 4.11 mg/dL — ABNORMAL HIGH (ref 0.44–1.00)
GFR, Estimated: 10 mL/min — ABNORMAL LOW (ref >60)
Glucose, Bld: 104 mg/dL — ABNORMAL HIGH (ref 70–99)
Potassium: 4.9 mmol/L (ref 3.5–5.1)
Sodium: 133 mmol/L — ABNORMAL LOW (ref 135–145)

## 2021-04-11 LAB — GLUCOSE, CAPILLARY
Glucose-Capillary: 138 mg/dL — ABNORMAL HIGH (ref 70–99)
Glucose-Capillary: 125 mg/dL — ABNORMAL HIGH (ref 70–99)
Glucose-Capillary: 120 mg/dL — ABNORMAL HIGH (ref 70–99)
Glucose-Capillary: 141 mg/dL — ABNORMAL HIGH (ref 70–99)

## 2021-04-11 LAB — RESP PANEL BY RT-PCR (FLU A&B, COVID) ARPGX2
Influenza A by PCR: NEGATIVE
Influenza B by PCR: NEGATIVE
SARS Coronavirus 2 by RT PCR: NEGATIVE

## 2021-04-11 LAB — SURGICAL PATHOLOGY

## 2021-04-11 MED ORDER — GABAPENTIN 100 MG PO CAPS
100.0000 mg | ORAL_CAPSULE | Freq: Every day | ORAL | Status: DC
  Administered 2021-04-12 – 2021-04-14 (×3): 100 mg via ORAL
  Filled 2021-04-11 (×3): qty 1

## 2021-04-11 NOTE — Progress Notes (Signed)
Physical Therapy Treatment Patient Details [name] MRN: [id number] DOB: 08/02/1940 Today's Date: 04/11/2021   History of Present Illness 81 y.o. female presents to [facility] hospital on 04/04/2021 with concerns for osteomyelitis of L foot. S/p angiogram with SFA drug coated balloon angioplasty and unsuccessful recannulization of the ATA 2/22. S/p L first ray amp on 2/24. Ultrasound taken of lower extremities 2/26 negative for DVT.  PMH includes ESRD, DMII, Afib, COPD, CHF, HTN, CVA.    PT Comments    Pt received in supine, lethargic and oriented x0 (to year of birth only), pt remains drowsy/lethargic throughout session, awakening briefly with max multimodal cues to perform bed-level mobility tasks. Pt needing totalA trunk support for seated balance due to posterior lean and with frequent myoclonal jerking of BUE inhibiting her seated balance. Pt unable to follow instructions for transfer training today due to fatigue/lethargy. Pt participated in [place] in supine and some LUE reaching/repositioning tasks in bed. Pt continues to benefit from PT services to progress toward functional mobility goals.   Recommendations for follow up therapy are one component of a multi-disciplinary discharge planning process, led by the attending physician.  Recommendations may be updated based on patient status, additional functional criteria and insurance authorization.  Follow Up Recommendations  Skilled nursing-short term rehab (<3 hours/day)     Assistance Recommended at Discharge Frequent or constant Supervision/Assistance  Patient can return home with the following A lot of help with walking and/or transfers;Two people to help with walking and/or transfers;A lot of help with bathing/dressing/bathroom;Two people to help with bathing/dressing/bathroom;Assistance with cooking/housework;Assistance with feeding;Direct supervision/assist for medications management;Direct supervision/assist for financial  management;Assist for transportation;Help with stairs or ramp for entrance   Equipment Recommendations  Other (comment) (defer to next venue of care)    Recommendations for Other Services       Precautions / Restrictions Precautions Precautions: Fall;Other (comment) Precaution Comments: monitor BP and HR (low) Restrictions Weight Bearing Restrictions: No     Mobility  Bed Mobility Overal bed mobility: Needs Assistance Bed Mobility: Rolling, Sidelying to Sit, Sit to Supine Rolling: Max assist, Total assist Sidelying to sit: Max assist, HOB elevated   Sit to supine: Total assist   General bed mobility comments: pt able to initiate cross-body reaching for rolling toward opposite rail with increased time and multimodal cues but unable to maintain grip on railing or to pull hips over to side without max to totalA. Pt attempting to push up with side rail for transition to upright posture but needs heavy maxA for trunk lifting. Returning to supine needs totalA for trunk and LE assist due to fatigue/lethargy.    Transfers                   General transfer comment: pt too lethargic to safely attempt today    Ambulation/Gait                   Stairs             Wheelchair Mobility    Modified Rankin (Stroke Patients Only)       Balance Overall balance assessment: Needs assistance Sitting-balance support: No upper extremity supported, Feet supported Sitting balance-Leahy Scale: Fair Sitting balance - Comments: Static sitting EOB with max to totalA, pt lethargic maintains eyes mostly closed and frequent myoclonic "jerks" awake with UE, pt unable to utilize BUE for sitting support due to lethargy/fatigue and frequent jerking motions as she is falling asleep Postural control: Posterior lean  Standing balance comment: unsafe to attempt                            Cognition Arousal/Alertness: Lethargic Behavior During Therapy: WFL for  tasks assessed/performed Overall Cognitive Status: No family/caregiver present to determine baseline cognitive functioning                                 General Comments: Pt very lethargic upon PTA arrival to room, will open eyes briefly to command but does not focus or maintain attention and pt unable to maintain eyes open >5 seconds even after therapist helped her to wipe face/eyes with washcloth. Pt difficult to understand due to dysarthria and stating birthday as "68" when therapist again requests month and day of birthday she still states "1942". Not oriented to situation/location. Pt expresed fear of falls with attempts to sit up but otherwise lethargic.        Exercises Other Exercises Other Exercises: BLE AAROM: heel slides, hip abduction, SLR, ankle pumps, SAQ x10 reps ea Other Exercises: pulling with LUE on L bed side rail for anterior weight shifting and core engagement with bed in chair posture x5 reps    General Comments General comments (skin integrity, edema, etc.): SpO2 93-97% with exertion on RA, DOE to 2/4 with sitting attempt; HR 78-83, per monitor had dropped to <55 bpm briefly but may due to movement.      Pertinent Vitals/Pain Pain Assessment Pain Assessment: Faces Faces Pain Scale: Hurts little more Pain Location: LLE tender to palpation, seems to increased with hip/knee flexion during exercise, pt unable to localize Pain Descriptors / Indicators: Discomfort, Grimacing, Guarding Pain Intervention(s): Limited activity within patient's tolerance, Monitored during session, Repositioned (pt heels floated)     PT Goals (current goals can now be found in the care plan section) Acute Rehab PT Goals Patient Stated Goal: to sit up more PT Goal Formulation: With patient/family Time For Goal Achievement: 04/21/21 Progress towards PT goals: Progressing toward goals    Frequency    Min 2X/week      PT Plan Current plan remains appropriate        AM-PAC PT "6 Clicks" Mobility   Outcome Measure  Help needed turning from your back to your side while in a flat bed without using bedrails?: Total Help needed moving from lying on your back to sitting on the side of a flat bed without using bedrails?: Total Help needed moving to and from a bed to a chair (including a wheelchair)?: Total Help needed standing up from a chair using your arms (e.g., wheelchair or bedside chair)?: Total Help needed to walk in hospital room?: Total Help needed climbing 3-5 steps with a railing? : Total 6 Click Score: 6    End of Session Equipment Utilized During Treatment: Oxygen Activity Tolerance: Patient limited by lethargy Patient left: in bed;with call bell/phone within reach;with bed alarm set Nurse Communication: Mobility status;Other (comment) (pt lethargy, will need feeding assist as her lunch tray just arrived.) PT Visit Diagnosis: Unsteadiness on feet (R26.81);Muscle weakness (generalized) (M62.81);History of falling (Z91.81);Difficulty in walking, not elsewhere classified (R26.2)     Time: 3976-7341 PT Time Calculation (min) (ACUTE ONLY): 27 min  Charges:  $Therapeutic Exercise: 8-22 mins $Therapeutic Activity: 8-22 mins                     Dub Maclellan P.,  PTA Acute Rehabilitation Services Pager: [phone] Office: [place] 04/11/2021, 2:36 PM

## 2021-04-11 NOTE — Consult Note (Signed)
°  Progress Note    04/11/2021 5:52 PM 3 Days Post-Op  Subjective: Complaining of left leg pain and swelling  Vitals:   04/11/21 1118 04/11/21 1607  BP: (!) 91/49 92/61  Pulse: 64 88  Resp: 15 16  Temp: (!) 97.4 F (36.3 C) 97.7 F (36.5 C)  SpO2: 100% 96%    Physical Exam: Awake and alert Nonlabored respirations Left leg with nonpitting edema Dressing left foot clean dry intact Strong left peroneal signal at the ankle  CBC    Component Value Date/Time   WBC 7.2 04/11/2021 0232   RBC 2.33 (L) 04/11/2021 0232   HGB 8.2 (L) 04/11/2021 0232   HCT 25.6 (L) 04/11/2021 0232   PLT 132 (L) 04/11/2021 0232   MCV 109.9 (H) 04/11/2021 0232   MCH 35.2 (H) 04/11/2021 0232   MCHC 32.0 04/11/2021 0232   RDW 20.3 (H) 04/11/2021 0232   LYMPHSABS 0.9 04/04/2021 1245   MONOABS 0.6 04/04/2021 1245   EOSABS 0.1 04/04/2021 1245   BASOSABS 0.1 04/04/2021 1245    BMET    Component Value Date/Time   NA 133 (L) 04/11/2021 0232   K 4.9 04/11/2021 0232   CL 98 04/11/2021 0232   CO2 24 04/11/2021 0232   GLUCOSE 104 (H) 04/11/2021 0232   BUN 25 (H) 04/11/2021 0232   CREATININE 4.11 (H) 04/11/2021 0232   CALCIUM 9.7 04/11/2021 0232   GFRNONAA 10 (L) 04/11/2021 0232   GFRAA 9 (L) 10/27/2019 0112    INR    Component Value Date/Time   INR 1.0 12/27/2019 1327     Intake/Output Summary (Last 24 hours) at 04/11/2021 1752 Last data filed at 04/11/2021 0932 Gross per 24 hour  Intake 240 ml  Output --  Net 240 ml     Assessment/plan:  81 y.o. female is s/p left SFA balloon angioplasty with peroneal runoff status post left first toe amputation with podiatry.  Swelling noted today with leg pain and negative DVT study most likely swelling secondary to post revascularization and hyperemia secondary to toe amputation.  I will have the patient follow-up in our office with left lower extremity duplex and ABIs in approximately 1 month.    Janayla Marik C. Donzetta Matters, MD [facility] [place] Office: [phone] Pager: [phone]  04/11/2021 5:52 PM

## 2021-04-11 NOTE — NC FL2 (Signed)
[place] MEDICAID FL2 LEVEL OF CARE SCREENING TOOL     IDENTIFICATION  Patient [name] Birthdate: [birth year] Sex: female Admission Date (Current Location): 04/04/2021  [facility] and [place] Number:  Herbalist and Address:  [facility] [place] [address] [place], [place] [address]      Provider Number: [id number]  Attending Physician Name and Address:  Elmarie Shiley, MD  Relative Name and Phone Number:  [name] (Niece)   [phone] [facility])    Current Level of Care: Hospital Recommended Level of Care: [place] Prior Approval Number:    Date Approved/Denied:   PASRR Number: [id number]  Discharge Plan: SNF    Current Diagnoses: Patient Active Problem List   Diagnosis Date Noted   Leg pain 63/87/5643   Acute metabolic encephalopathy 32/95/1884   Sinus bradycardia 04/07/2021   Hypokalemia 04/05/2021   History of CVA (cerebrovascular accident) 04/04/2021   Acute osteomyelitis of left foot ([place]) 04/04/2021   Chronic respiratory failure ([place]) 04/04/2021   Anemia of chronic kidney failure 04/04/2021   Hyponatremia 04/04/2021   Memory loss 04/04/2021   Pressure injury of skin 03/04/2021   Rectal bleeding 12/27/2019   NSVT (nonsustained ventricular tachycardia) 10/27/2019   Megaloblastic anemia 10/26/2019   Chronic blood loss anemia 10/26/2017   Iron deficiency anemia 02/23/2017   Chronic diastolic CHF (congestive heart failure) ([place]) 10/15/2016   Type 2 diabetes mellitus with nephropathy ([place]) 10/15/2016   ESRD on dialysis ([place]) 10/15/2016   NSTEMI (non-ST elevated myocardial infarction) ([place]) 10/03/2015   COPD with chornic respiratory failure on oxygen 2L [place] 06/05/2015   Anemia in chronic kidney disease, Stage IV 06/05/2015   Atrial fibrillation ([place]) 06/05/2015   TIA (transient ischemic attack) 09/02/2014   Gait difficulty 09/02/2014   Other specified transient cerebral ischemias    Stroke  with cerebral ischemia (HCC)    Hemispheric carotid artery syndrome    Essential hypertension     Orientation RESPIRATION BLADDER Height & Weight     Self  O2 (3L [place]) Continent Weight: 187 lb 13.3 oz (85.2 kg) Height:  5\' 7"  (166.0 cm)  BEHAVIORAL SYMPTOMS/MOOD NEUROLOGICAL BOWEL NUTRITION STATUS      Incontinent Diet (See d/c summary)  AMBULATORY STATUS COMMUNICATION OF NEEDS Skin   Extensive Assist Verbally PU Stage and Appropriate Care (Pressure injury, Left foot, anterior stage 3)                       Personal Care Assistance Level of Assistance  Bathing, Feeding, Dressing Bathing Assistance: Maximum assistance Feeding assistance: Independent Dressing Assistance: Maximum assistance     Functional Limitations Info  Sight, Hearing, Speech Sight Info: Adequate Hearing Info: Adequate Speech Info: Adequate    SPECIAL CARE FACTORS FREQUENCY  PT (By licensed PT), OT (By licensed OT)     PT Frequency: 5x/week OT Frequency: 5x/week            Contractures Contractures Info: Not present    Additional Factors Info  Code Status, Allergies Code Status Info: Full code Allergies Info: Codeine           Current Medications (04/11/2021):  This is the current hospital active medication list Current Facility-Administered Medications  Medication Dose Route Frequency Provider Last Rate Last Admin   (feeding supplement) PROSource Plus liquid 30 mL  30 mL Oral BID BM Loren Racer, PA-C   30 mL at 04/11/21 0959   0.9 %  sodium chloride infusion  250 mL Intravenous PRN Orma Flaming, MD       0.9 %  sodium chloride infusion  250 mL Intravenous PRN Broadus John, MD       acetaminophen (TYLENOL) tablet 650 mg  650 mg Oral Q6H PRN Orma Flaming, MD   650 mg at 04/10/21 1547   Or   acetaminophen (TYLENOL) suppository 650 mg  650 mg Rectal Q6H PRN Orma Flaming, MD       acetaminophen (TYLENOL) tablet 500 mg  500 mg Oral TID Regalado, Belkys A, MD   500 mg at  04/11/21 1000   albuterol (PROVENTIL) (2.5 MG/3ML) 0.083% nebulizer solution 0.63 mg  0.63 mg Nebulization Q6H PRN Orma Flaming, MD       apixaban Arne [place]) tablet 2.5 mg  2.5 mg Oral BID Regalado, Belkys A, MD   2.5 mg at 04/11/21 0959   aspirin EC tablet 81 mg  81 mg Oral Daily Broadus John, MD   81 mg at 04/11/21 1000   atorvastatin (LIPITOR) tablet 40 mg  40 mg Oral Daily Broadus John, MD   40 mg at 04/11/21 5956   ceFEPIme (MAXIPIME) 2 g in sodium chloride 0.9 % 100 mL IVPB  2 g Intravenous Q T,Th,Sa-HD Lyndee Leo, RPH       Chlorhexidine Gluconate Cloth 2 % PADS 6 each  6 each Topical Daily Orma Flaming, MD   6 each at 04/07/21 1004   Chlorhexidine Gluconate Cloth 2 % PADS 6 each  6 each Topical Daily Regalado, Belkys A, MD   6 each at 04/11/21 1001   clopidogrel (PLAVIX) tablet 75 mg  75 mg Oral Q breakfast Broadus John, MD   75 mg at 04/11/21 3875   Darbepoetin Alfa (ARANESP) injection 200 mcg  200 mcg Intravenous Q Thu-HD Loren Racer, PA-C   200 mcg at 04/07/21 1653   ferrous sulfate tablet 325 mg  325 mg Oral BID WC Orma Flaming, MD   325 mg at 04/11/21 6433   gabapentin (NEURONTIN) capsule 100 mg  100 mg Oral TID Orma Flaming, MD   100 mg at 04/11/21 2951   hydrALAZINE (APRESOLINE) injection 5 mg  5 mg Intravenous Q20 Min PRN Broadus John, MD       insulin aspart (novoLOG) injection 0-6 Units  0-6 Units Subcutaneous TID WC Orma Flaming, MD       lactulose (CHRONULAC) 10 GM/15ML solution 20 g  20 g Oral BID Regalado, Belkys A, MD   20 g at 04/11/21 1001   metoprolol tartrate (LOPRESSOR) tablet 12.5 mg  12.5 mg Oral BID Regalado, Belkys A, MD   12.5 mg at 04/10/21 2220   metroNIDAZOLE (FLAGYL) IVPB 500 mg  500 mg Intravenous Q12H Orma Flaming, MD 100 mL/hr at 04/11/21 0439 500 mg at 04/11/21 0439   ondansetron (ZOFRAN) injection 4 mg  4 mg Intravenous Q6H PRN Broadus John, MD       pramipexole (MIRAPEX) tablet 0.125 mg  0.125 mg Oral Daily Orma Flaming, MD   0.125 mg at 04/11/21 1001   sevelamer carbonate (RENVELA) tablet 800 mg  800 mg Oral TID WC Orma Flaming, MD   800 mg at 04/11/21 1000   sodium chloride flush (NS) 0.9 % injection 3 mL  3 mL Intravenous Q12H Orma Flaming, MD   3 mL at 04/11/21 1002   sodium chloride flush (NS) 0.9 % injection 3 mL  3 mL Intravenous PRN Orma Flaming, MD  sodium chloride flush (NS) 0.9 % injection 3 mL  3 mL Intravenous Q12H Broadus John, MD   3 mL at 04/10/21 2226   sodium chloride flush (NS) 0.9 % injection 3 mL  3 mL Intravenous PRN Broadus John, MD       vancomycin (VANCOCIN) IVPB 1000 mg/200 mL premix  1,000 mg Intravenous Q T,Th,Sa-HD Pauletta Browns, [facility]   Stopped at 04/09/21 1103   vitamin B-12 (CYANOCOBALAMIN) tablet 100 mcg  100 mcg Oral Daily Regalado, Belkys A, MD   100 mcg at 04/11/21 1002     Discharge Medications: Please see discharge summary for a list of discharge medications.  Relevant Imaging Results:  Relevant Lab Results:   Additional Information SS# [id number]    PASSR name  [place]anna Aisha Greenberger, LCSW

## 2021-04-11 NOTE — Care Management Important Message (Signed)
Important Message  Patient Details  [name] MRN: [id number] Date of Birth: [birth year]   Medicare Important Message Given:  Yes     Shelda Altes 04/11/2021, 11:15 AM

## 2021-04-11 NOTE — Progress Notes (Signed)
Progress Note   Patient: Jody [name] TDV:[id number] DOB: 1941-01-17 DOA: 04/04/2021     7 DOS: the patient was seen and examined on 04/11/2021   Brief hospital course: 81 year old past medical history significant for ESRD on hemodialysis TTS, diabetes type 2, A-fib, COPD on chronic 2 L of oxygen, diastolic heart failure, anemia of chronic kidney disease, hypertension, history of CVA who presents to the ED with concern of left foot osteomyelitis/PAD.  She was sent from podiatry clinic.  Per patient she has been having left foot problem for about 2 weeks.  Patient has been getting IV antibiotics for the last 2 weeks with hemodialysis as an outpatient.  Culture grew Finegoldia magna.   MRI of the left foot: Show abnormal marrow signal of the head of the first metatarsal and base of the proximal phalanx of the first digit, which in the presence of adjacent deep skin wound is consistent with acute osteomyelitis/septic arthritis.  Deep skin wound about the medial aspect of the first metatarsal head.  Underwent arteriogram with SFA drug coated balloon angioplasty and unsuccessful recannulization of the ATA.  Underwent Left first Toe amputation 2/24.  Pathology: Gangrenous cutaneous ulcer with underlying gangrenous cellulitis  extending to bone showing marked acute osteomyelitis  Acute osteomyelitis present within bone margin and separate portion of  bone skin and soft tissue margin grossly viable. ID consulted.   Assessment and Plan: * Acute osteomyelitis of left foot ([place])- (present on admission) Presents with Acute osteomyelitis of left big toe, failed IV abx outpatient. -Has been receiving IV vancomycin and cefepime at dialysis since 03/24/21. -Wound culture grew out finegoldia magna. -Continue with Vancomycin, Cefepime and flagyl.  -Dr Jacqualyn Posey recommend Partial first ray amputation.  -ABI: Right and left resting right ankle-brachial index indicate noncompressible right lower extremity arteries.   Right and left toe brachial index is abnormal. -For arteriogram 2/22: Successful drug-coated balloon angioplasty of the superficial femoral artery. Unsuccessful recanalization of the anterior tibial artery. -Underwent first toe left foot amputation 2/24.  Recommendation is to continue with IV antibiotics for now.  -ID consult osteomyelitis surgical margined.   Leg pain Complaints of leg pain. Likely related to Sx.  Doppler negative for DVT  Acute metabolic encephalopathy She has been more confuse. Suspect hospital delirium, medication.  Delirium precaution.  Ammonia mildly elevated at 43. Started on lactulose.  Reduce gabapentin to daily.   Sinus bradycardia Asymptomatic.  Hold Cardizem  Holder parameter for metoprolol.  Improved.   Hypokalemia Correction with HD.   Memory loss [place], asked that she be called for medical decisions.  Delirium precautions  B12: 358, started  Supplement.  Hyponatremia Likely secondary to renal failure. Continue to follow.  Anemia of chronic kidney failure Baseline 7-8 No IV iron with infection. On Aranesp.  Received one unit PRBC 2/22. Hb down to 7.5 monitor.   Chronic respiratory failure (HCC) Stable on 2 L oxygen.   History of CVA (cerebrovascular accident) On /eliquis,   ESRD on dialysis ([place]) On dialysis TTS. Nephrology following.  Continue with HD>   Type 2 diabetes mellitus with nephropathy ([place])- (present on admission) a1c of 5.3 in 02/2021. SSI   Chronic diastolic CHF (congestive heart failure) ([place])- (present on admission) Last echo: 06/2020: EF of 45-50% with mildly reduced LVF. Hypokinesis of the inferoseptal, inferolateral and distal inferior walls . Diastolic parameters indeterminate.  Volume management with HD  Discontinue lasix.   Atrial fibrillation ([place])- (present on admission) Rate controlled. Holding Cardizem and Metoprolol due to  Bradycardia.  Continue with Eliquis.   COPD with chornic  respiratory failure on oxygen 2L [place]- (present on admission) On chronic 2L oxygen, continue No signs of exacerbation. Continue with albuterol prn   Essential hypertension- (present on admission) Hold  cardizem 120mg  daily and holder parameter for metoprolol due to bradycardia    Pressure injury.  64-year-old left stage III, s/p surgery     Subjective: she wake up answer question. Relates foot hurt. Report gabapentin makes her sleepy.   Physical Exam: Vitals:   04/11/21 0556 04/11/21 0724 04/11/21 1118 04/11/21 1607  BP: 95/62 (!) 85/53 (!) 91/49 92/61  Pulse: 76 68 64 88  Resp: 18 16 15 16   Temp: (!) 97.4 F (36.3 C) (!) 97.5 F (36.4 C) (!) 97.4 F (36.3 C) 97.7 F (36.5 C)  TempSrc: Axillary Oral Axillary Oral  SpO2: 98% 98% 100% 96%  Weight:      Height:       General; sleepy, wake up answer questions.  CVS; S 1, S 2 RRR Lung; CTA LE foot with dressing.   Data Reviewed:  Cbc and Bmet  Family Communication: Niece 2/26  Disposition: Status is: Inpatient Remains inpatient appropriate because: needs further evaluation by ID for osteomyelitis.           Planned Discharge Destination: Skilled nursing facility     Time spent: 45 minutes  Author: Elmarie Shiley, MD 04/11/2021 4:47 PM  For on call review [email or web address].

## 2021-04-11 NOTE — TOC Progression Note (Addendum)
Transition of Care [facility]) - Progression Note    Patient Details  [name] MRN: [id number] Date of Birth: 11-18-1940  Transition of Care [facility]) CM/SW [place], [place] Phone Number: 04/11/2021, 12:22 PM  Clinical Narrative:      Update-4:45pm- CSW uploaded most current PT note to patients insurance. Patients insurance authorization currently pending.   CSW spoke with patricia with [facility] who confirmed patient comes from there long term. Mardene Celeste confirmed patient can return when medically ready for dc. CSW started Lexicographer number # [id number]. CSW requested Covid for patient. [name] continue to follow.  Expected Discharge Plan: [place] Barriers to Discharge: Continued Medical Work up  Expected Discharge Plan and Services Expected Discharge Plan: [place] In-house Referral: Clinical Social Work     Living arrangements for the past 2 months: [place]                                       Social Determinants of Health (SDOH) Interventions    Readmission Risk Interventions Readmission Risk Prevention Plan 12/30/2019 10/27/2019  Transportation Screening Complete Complete  PCP or Specialist Appt within 5-7 Days - Complete  Home Care Screening - Complete  Medication Review (RN CM) - Complete  HRI or Home Care Consult Complete -  Social Work Consult for Recovery Care Planning/Counseling Complete -  Palliative Care Screening Not Complete -  Medication Review Press photographer) Complete -  Some recent data might be hidden

## 2021-04-11 NOTE — Plan of Care (Signed)
°  Problem: Activity: Goal: Ability to tolerate increased activity will improve Outcome: Progressing   Problem: Cardiac: Goal: Ability to achieve and maintain adequate cardiopulmonary perfusion will improve Outcome: Progressing   Problem: Clinical Measurements: Goal: Will remain free from infection Outcome: Progressing Goal: Diagnostic test results will improve Outcome: Progressing   Problem: Activity: Goal: Risk for activity intolerance will decrease Outcome: Progressing

## 2021-04-11 NOTE — Progress Notes (Signed)
[facility] Progress Note   Subjective:  pt seen in room. Reports that she has neuropathy in her feet, slightly painful   Objective Vitals:   04/11/21 0441 04/11/21 0500 04/11/21 0556 04/11/21 0724  BP: 95/62  95/62 (!) 85/53  Pulse: 76  76 68  Resp: 18  18 16   Temp: (!) 97.4 F (36.3 C)  (!) 97.4 F (36.3 C) (!) 97.5 F (36.4 C)  TempSrc:   Axillary Oral  SpO2:   98% 98%  Weight:  85.2 kg    Height:       Physical Exam General: Elderly woman, NAD, sleeping but arousable Heart: Irreg irregular, no murmur Lungs: CTA anteriorly Abdomen: soft Extremities: No LE edema; L foot wrapped Dialysis Access: RUE AVF + bruit  Additional Objective Labs: Basic Metabolic Panel: Recent Labs  Lab 04/08/21 0714 04/09/21 0712 04/11/21 0232  NA 132* 133* 133*  K 3.7 4.1 4.9  CL 97* 97* 98  CO2 24 26 24   GLUCOSE 108* 95 104*  BUN 20 28* 25*  CREATININE 3.32* 4.21* 4.11*  CALCIUM 9.3 9.5 9.7  PHOS  --  4.3  --    Liver Function Tests: Recent Labs  Lab 04/04/21 1245 04/09/21 0712  AST 17  --   ALT 10  --   ALKPHOS 84  --   BILITOT 0.6  --   PROT 6.8  --   ALBUMIN 3.1* 2.5*   CBC: Recent Labs  Lab 04/04/21 1245 04/05/21 0400 04/06/21 0454 04/07/21 0222 04/08/21 0714 04/09/21 0712 04/11/21 0232  WBC 5.4   < > 4.0 6.2 7.1 6.0 7.2  NEUTROABS 3.8  --   --   --   --   --   --   HGB 8.3*   < > 6.8* 7.7* 8.1* 7.5* 8.2*  HCT 27.4*   < > 21.8* 24.5* 26.5* 24.0* 25.6*  MCV 115.6*   < > 112.4* 108.9* 108.6* 111.1* 109.9*  PLT 165   < > 111* 106* 120* 112* 132*   < > = values in this interval not displayed.   Blood Culture    Component Value Date/Time   SDES BLOOD LEFT ANTECUBITAL 04/05/2021 0359   SPECREQUEST  04/05/2021 0359    BOTTLES DRAWN AEROBIC AND ANAEROBIC Blood Culture results may not be optimal due to an excessive volume of blood received in culture bottles   CULT  04/05/2021 0359    NO GROWTH 5 DAYS Performed at [facility],  [place] [address] [place], [place] [address]    REPTSTATUS 04/10/2021 FINAL 04/05/2021 0359   Studies/Results: VAS [place] LOWER EXTREMITY VENOUS (DVT)  Result Date: 04/10/2021  Lower Venous DVT Study Patient Name:  JODINE MUCHMORE  Date of Exam:   04/10/2021 Medical Rec #: [id number]        Accession #:    [id number] Date of Birth: February 10, 1941        Patient Gender: F Patient Age:   25 years Exam Location:  [facility] Procedure:      VAS [place] LOWER EXTREMITY VENOUS (DVT) Referring Phys: Celesta Gentile --------------------------------------------------------------------------------  Indications: Edema, and Pain.  Risk Factors: Status post left great toe amputation 04/08/2021. Comparison Study: No prior study Performing Technologist: Sharion Dove RVS  Examination Guidelines: A complete evaluation includes B-mode imaging, spectral Doppler, color Doppler, and power Doppler as needed of all accessible portions of each vessel. Bilateral testing is considered an integral part of a complete examination. Limited examinations for reoccurring indications  may be performed as noted. The reflux portion of the exam is performed with the patient in reverse Trendelenburg.  +-----+---------------+---------+-----------+------------------+--------------+  RIGHT Compressibility Phasicity Spontaneity Properties         Thrombus Aging  +-----+---------------+---------+-----------+------------------+--------------+  CFV   Full                                  pulsatile waveform                 +-----+---------------+---------+-----------+------------------+--------------+   +---------+---------------+---------+-----------+---------------+--------------+  LEFT      Compressibility Phasicity Spontaneity Properties      Thrombus Aging  +---------+---------------+---------+-----------+---------------+--------------+  CFV       Full                                  pulsatile                                                                         waveform                        +---------+---------------+---------+-----------+---------------+--------------+  SFJ       Full                                                                  +---------+---------------+---------+-----------+---------------+--------------+  FV Prox   Full                                                                  +---------+---------------+---------+-----------+---------------+--------------+  FV Mid    Full                                                                  +---------+---------------+---------+-----------+---------------+--------------+  FV Distal Full                                                                  +---------+---------------+---------+-----------+---------------+--------------+  PFV       Full                                                                  +---------+---------------+---------+-----------+---------------+--------------+  POP       Full                                  pulsatile                                                                        waveform                        +---------+---------------+---------+-----------+---------------+--------------+  PTV       Full                                                                  +---------+---------------+---------+-----------+---------------+--------------+  PERO                                                            Not well                                                                         visualized      +---------+---------------+---------+-----------+---------------+--------------+    Summary: RIGHT: - No evidence of common femoral vein obstruction.  LEFT: - There is no evidence of deep vein thrombosis in the lower extremity. However, portions of this examination were limited- see technologist comments above.  - Ultrasound characteristics of enlarged lymph nodes noted in the groin. Diffuse interstitial edema noted throughout the left lower extremity.  Waveforms are pulsatile suggestive of fluid overload.  *See table(s) above for measurements and observations.    Preliminary    Medications:  sodium chloride     sodium chloride     ceFEPime (MAXIPIME) IV     metronidazole 500 mg (04/11/21 0439)   vancomycin Stopped (04/09/21 1103)    (feeding supplement) PROSource Plus  30 mL Oral BID BM   acetaminophen  500 mg Oral TID   apixaban  2.5 mg Oral BID   aspirin EC  81 mg Oral Daily   atorvastatin  40 mg Oral Daily   Chlorhexidine Gluconate Cloth  6 each Topical Daily   Chlorhexidine Gluconate Cloth  6 each Topical Daily   clopidogrel  75 mg Oral Q breakfast   darbepoetin (ARANESP) injection - DIALYSIS  200 mcg Intravenous Q Thu-HD   ferrous sulfate  325 mg Oral BID WC   gabapentin  100 mg Oral TID   insulin aspart  0-6 Units Subcutaneous TID WC   lactulose  20 g Oral BID   metoprolol tartrate  12.5  mg Oral BID   pramipexole  0.125 mg Oral Daily   sevelamer carbonate  800 mg Oral TID WC   sodium chloride flush  3 mL Intravenous Q12H   sodium chloride flush  3 mL Intravenous Q12H   vitamin B-12  100 mcg Oral Daily    OP HD: TTS [facility] - HBsAb 11 on 05/11/20. 3h 68min   79.5kg     2K/2.5Ca bath  RUE AVF  15g   Hep none - Hep B SAg neg 2/20, COVID neg 2/20 - Calcitriol 1.49mcg PO q HD - Mircera 220mcg IV q 2 weeks - Venofer 50mg  IV weekly - On Vanc 1g and Cefepime 2g since 2/9. Wound Cx at OP clinic: Finegoldia magna   Assessment/Plan:  L foot osteomyelitis: IV abx broad-spec since admit on 2/20 (van'c cefepime/ flagyl) Afebrile. BCxs negative. SP L great toe amp on 2/24.  PAD: ABIs abnormal, s/p angioplasty to L SFA by [facility] on 2/22.  ESRD: on HD TTS. Next HD 2/28.   Hypertension/volume: overall soft BP. Diltiazem stopped, getting metoprolol bid. Wt's appear inaccurate, up 7kg but not vol overloaded by exam and BP's soft.   Anemia: Hgb 7.7, s/p 1U PRBCs on 2/22. No IV iron d/t infection.  SP darbe 200 ug on 2/23. Ordered weekly.    Metabolic bone disease: Ca 10.2 on admit, VDRA on hold. Continue Renvela as binder.  Nutrition:  Alb low (3.1) - continue supplements. A-fib: On metoprolol 12.5 bid, po diltiazem and eliquis dc'd Memory loss - confused, prob delirium, seems better today COPD Hx CVA  Madelon Lips, MD 04/11/2021, 11:07 AM

## 2021-04-11 NOTE — Consult Note (Signed)
[facility]       Reason for Consult: osteomyelitis    Referring Physician: Dr. Tyrell Antonio  Principal Problem:   Acute osteomyelitis of left foot ([place]) Active Problems:   Essential hypertension   COPD with chornic respiratory failure on oxygen 2L [place]   Atrial fibrillation (HCC)   Chronic diastolic CHF (congestive heart failure) (HCC)   Type 2 diabetes mellitus with nephropathy (HCC)   ESRD on dialysis ([place])   History of CVA (cerebrovascular accident)   Chronic respiratory failure (HCC)   Anemia of chronic kidney failure   Hyponatremia   Memory loss   Hypokalemia   Sinus bradycardia   Acute metabolic encephalopathy   Leg pain    (feeding supplement) PROSource Plus  30 mL Oral BID BM   acetaminophen  500 mg Oral TID   apixaban  2.5 mg Oral BID   aspirin EC  81 mg Oral Daily   atorvastatin  40 mg Oral Daily   Chlorhexidine Gluconate Cloth  6 each Topical Daily   Chlorhexidine Gluconate Cloth  6 each Topical Daily   clopidogrel  75 mg Oral Q breakfast   darbepoetin (ARANESP) injection - DIALYSIS  200 mcg Intravenous Q Thu-HD   ferrous sulfate  325 mg Oral BID WC   [START ON 04/12/2021] gabapentin  100 mg Oral QHS   insulin aspart  0-6 Units Subcutaneous TID WC   lactulose  20 g Oral BID   metoprolol tartrate  12.5 mg Oral BID   pramipexole  0.125 mg Oral Daily   sevelamer carbonate  800 mg Oral TID WC   sodium chloride flush  3 mL Intravenous Q12H   sodium chloride flush  3 mL Intravenous Q12H   vitamin B-12  100 mcg Oral Daily    Recommendations: Stop antibiotics  Assessment: She has osteomyelitis of the left first digit now s/p operative amputation with good gross margins c/w good source control.    Antibiotics: Day 3 antibiotics post surgery  HPI: Jody [name] is a 81 y.o. female with a history of ESRD on intermittent hemodialysis, diabetes, afib, COPD on continuous oxygen whe came in from the podiatry clinic for concern for an  infected left great toe. MRI consistent with osteomyelitis.  She has been on antibiotics for over 2 weeks prior to admission. She underwent unsuccessful recannulization of the ATA during this hospitalization.     Review of Systems:  Constitutional: negative for fevers and chills All other systems reviewed and are negative    Past Medical History:  Diagnosis Date   Anemia in chronic kidney disease (CODE) 06/05/2015   Arthritis    "bad in my legs" (12/07/2014)   Chronic atrial fibrillation ([place]) 06/05/2015   Chronic back pain    "dr said my spine is crooked"   Chronic bronchitis ([place])    "get it q yr" (12/07/2014)   CKD stage 4 due to type 2 diabetes mellitus ([place]) 4/43/1540   Complication of anesthesia    headache for 2 days after surgery in May 2019   COPD (chronic obstructive pulmonary disease) ([place])    Gait difficulty 09/02/2014   Hypercholesterolemia    Hypertension    Neuropathy 2015   in legs   NSVT (nonsustained ventricular tachycardia)    Sinus brady-tachy syndrome ([place])    Stroke ([place]) 05/2014   denies residual on 12/07/2014   Type II diabetes mellitus ([place])     Social History   Tobacco Use   Smoking status:  Former    Packs/day: 0.50    Years: 50.00    Pack years: 25.00    Types: Cigarettes    Start date: 03/07/1953    Quit date: 01/14/2004    Years since quitting: 17.2   Smokeless tobacco: Never   Tobacco comments:    "quit smoking cigarettes  in ~ 2005"  Vaping Use   Vaping Use: Never used  Substance Use Topics   Alcohol use: No    Alcohol/week: 0.0 standard drinks    Comment: 10/242016 "quit drinking beer in ~ 1977"   Drug use: No    Family History  Problem Relation Age of Onset   Cancer Other    Heart attack Brother    Cancer Sister        breast   Alcohol abuse Brother     Allergies  Allergen Reactions   Codeine Nausea And Vomiting    Physical Exam: Constitutional: in no apparent distress  Vitals:   04/11/21 0724 04/11/21 1118  BP: (!)  85/53 (!) 91/49  Pulse: 68 64  Resp: 16 15  Temp: (!) 97.5 F (36.4 C) (!) 97.4 F (36.3 C)  SpO2: 98% 100%   EYES: anicteric Respiratory: normal respiratory effort Musculoskeletal: left foot wrapped Skin: no rash  Lab Results  Component Value Date   WBC 7.2 04/11/2021   HGB 8.2 (L) 04/11/2021   HCT 25.6 (L) 04/11/2021   MCV 109.9 (H) 04/11/2021   PLT 132 (L) 04/11/2021    Lab Results  Component Value Date   CREATININE 4.11 (H) 04/11/2021   BUN 25 (H) 04/11/2021   NA 133 (L) 04/11/2021   K 4.9 04/11/2021   CL 98 04/11/2021   CO2 24 04/11/2021    Lab Results  Component Value Date   ALT 10 04/04/2021   AST 17 04/04/2021   ALKPHOS 84 04/04/2021     Microbiology: Recent Results (from the past 240 hour(s))  Resp Panel by RT-PCR (Flu A&B, Covid) Nasopharyngeal Swab     Status: None   Collection Time: 04/04/21  1:20 PM   Specimen: Nasopharyngeal Swab; Nasopharyngeal(NP) swabs in vial transport medium  Result Value Ref Range Status   SARS Coronavirus 2 by RT PCR NEGATIVE NEGATIVE Final    Comment: (NOTE) SARS-CoV-2 target nucleic acids are NOT DETECTED.  The SARS-CoV-2 RNA is generally detectable in upper respiratory specimens during the acute phase of infection. The lowest concentration of SARS-CoV-2 viral copies this assay can detect is 138 copies/mL. A negative result does not preclude SARS-Cov-2 infection and should not be used as the sole basis for treatment or other patient management decisions. A negative result may occur with  improper specimen collection/handling, submission of specimen other than nasopharyngeal swab, presence of viral mutation(s) within the areas targeted by this assay, and inadequate number of viral copies(<138 copies/mL). A negative result must be combined with clinical observations, patient history, and epidemiological information. The expected result is Negative.  Fact Sheet for Patients:   EntrepreneurPulse.com.au  Fact Sheet for Healthcare Providers:  IncredibleEmployment.be  This test is no t yet approved or cleared by the [place] FDA and  has been authorized for detection and/or diagnosis of SARS-CoV-2 by FDA under an Emergency Use Authorization (EUA). This EUA will remain  in effect (meaning this test can be used) for the duration of the COVID-19 declaration under Section 564(b)(1) of the Act, 21 U.S.C.section 360bbb-3(b)(1), unless the authorization is terminated  or revoked sooner.       Influenza  A by PCR NEGATIVE NEGATIVE Final   Influenza B by PCR NEGATIVE NEGATIVE Final    Comment: (NOTE) The Xpert Xpress SARS-CoV-2/FLU/RSV plus assay is intended as an aid in the diagnosis of influenza from Nasopharyngeal swab specimens and should not be used as a sole basis for treatment. Nasal washings and aspirates are unacceptable for Xpert Xpress SARS-CoV-2/FLU/RSV testing.  Fact Sheet for Patients: EntrepreneurPulse.com.au  Fact Sheet for Healthcare Providers: IncredibleEmployment.be  This test is not yet approved or cleared by the [place] FDA and has been authorized for detection and/or diagnosis of SARS-CoV-2 by FDA under an Emergency Use Authorization (EUA). This EUA will remain in effect (meaning this test can be used) for the duration of the COVID-19 declaration under Section 564(b)(1) of the Act, 21 U.S.C. section 360bbb-3(b)(1), unless the authorization is terminated or revoked.  Performed at [facility], [place] [address] [place], [place] [address]   Blood culture (routine x 2)     Status: None   Collection Time: 04/04/21  4:40 PM   Specimen: BLOOD  Result Value Ref Range Status   Specimen Description BLOOD BLOOD RIGHT ARM  Final   Special Requests   Final    BOTTLES DRAWN AEROBIC ONLY Blood Culture adequate volume   Culture   Final    NO GROWTH 5  DAYS Performed at [facility], [place] [address], [place], [place] [address]    Report Status 04/09/2021 FINAL  Final  Blood culture (routine x 2)     Status: None   Collection Time: 04/05/21  3:59 AM   Specimen: BLOOD  Result Value Ref Range Status   Specimen Description BLOOD LEFT ANTECUBITAL  Final   Special Requests   Final    BOTTLES DRAWN AEROBIC AND ANAEROBIC Blood Culture results may not be optimal due to an excessive volume of blood received in culture bottles   Culture   Final    NO GROWTH 5 DAYS Performed at [facility], [place] [address], [place],  [address]    Report Status 04/10/2021 FINAL  Final    Thayer Headings, [facility] [email or web address] 04/11/2021, 1:43 PM

## 2021-04-12 ENCOUNTER — Inpatient Hospital Stay (HOSPITAL_COMMUNITY): Payer: Medicare Other

## 2021-04-12 DIAGNOSIS — Z992 Dependence on renal dialysis: Secondary | ICD-10-CM

## 2021-04-12 DIAGNOSIS — M86172 Other acute osteomyelitis, left ankle and foot: Secondary | ICD-10-CM | POA: Diagnosis not present

## 2021-04-12 DIAGNOSIS — N186 End stage renal disease: Secondary | ICD-10-CM | POA: Diagnosis not present

## 2021-04-12 DIAGNOSIS — J449 Chronic obstructive pulmonary disease, unspecified: Secondary | ICD-10-CM | POA: Diagnosis not present

## 2021-04-12 LAB — BLOOD GAS, ARTERIAL
Acid-Base Excess: 0.8 mmol/L (ref 0.0–2.0)
Bicarbonate: 28.2 mmol/L — ABNORMAL HIGH (ref 20.0–28.0)
Drawn by: 35849
O2 Content: 28 L/min
O2 Saturation: 97.3 %
Patient temperature: 37
pCO2 arterial: 56 mmHg — ABNORMAL HIGH (ref 32–48)
pH, Arterial: 7.31 — ABNORMAL LOW (ref 7.35–7.45)
pO2, Arterial: 102 mmHg (ref 83–108)

## 2021-04-12 LAB — GLUCOSE, CAPILLARY
Glucose-Capillary: 112 mg/dL — ABNORMAL HIGH (ref 70–99)
Glucose-Capillary: 113 mg/dL — ABNORMAL HIGH (ref 70–99)
Glucose-Capillary: 104 mg/dL — ABNORMAL HIGH (ref 70–99)
Glucose-Capillary: 86 mg/dL (ref 70–99)
Glucose-Capillary: 139 mg/dL — ABNORMAL HIGH (ref 70–99)

## 2021-04-12 MED ORDER — LACTULOSE 10 GM/15ML PO SOLN: 20.0000 g | Freq: Every day | ORAL | Status: DC | PRN

## 2021-04-12 MED ORDER — SODIUM CHLORIDE 0.9 % IV SOLN
2.0000 g | INTRAVENOUS | Status: DC
  Administered 2021-04-12 – 2021-04-14 (×2): 2 g via INTRAVENOUS
  Filled 2021-04-12 (×2): qty 2

## 2021-04-12 NOTE — Progress Notes (Signed)
ID progress note for today reviewed. Contacted [facility] and spoke to [place]. Clinic advised of ID rec for iv cefepime with HD through March 28. Clinic confirms they can provide abx at d/c. ID note faxed to clinic today and will fax d/c summary at d/c as well. Clinic aware pt may d/c in the next few days. Will assist as needed.   Melven Sartorius Renal Navigator [phone]

## 2021-04-12 NOTE — Progress Notes (Signed)
[facility] for Infectious Disease   Reason for visit: Follow up on acute osteomyelitis  Interval History: reviewed path with Dr. Tyrell Antonio, bone with acute osteomyelitis at the margins.  No cultures sent.   Day 20 total antibiotics  Physical Exam: Constitutional:  Vitals:   04/12/21 0410 04/12/21 0816  BP: (!) 104/52 91/65  Pulse: 95 77  Resp: 14 20  Temp: 97.9 F (36.6 C) 97.6 F (36.4 C)  SpO2: 92% 100%   patient appears in NAD Respiratory: Normal respiratory effort; CTA B Cardiovascular: RRR   Review of Systems: Unable to be assessed due to patient factors  Lab Results  Component Value Date   WBC 7.2 04/11/2021   HGB 8.2 (L) 04/11/2021   HCT 25.6 (L) 04/11/2021   MCV 109.9 (H) 04/11/2021   PLT 132 (L) 04/11/2021    Lab Results  Component Value Date   CREATININE 4.11 (H) 04/11/2021   BUN 25 (H) 04/11/2021   NA 133 (L) 04/11/2021   K 4.9 04/11/2021   CL 98 04/11/2021   CO2 24 04/11/2021    Lab Results  Component Value Date   ALT 10 04/04/2021   AST 17 04/04/2021   ALKPHOS 84 04/04/2021     Microbiology: Recent Results (from the past 240 hour(s))  Resp Panel by RT-PCR (Flu A&B, Covid) Nasopharyngeal Swab     Status: None   Collection Time: 04/04/21  1:20 PM   Specimen: Nasopharyngeal Swab; Nasopharyngeal(NP) swabs in vial transport medium  Result Value Ref Range Status   SARS Coronavirus 2 by RT PCR NEGATIVE NEGATIVE Final    Comment: (NOTE) SARS-CoV-2 target nucleic acids are NOT DETECTED.  The SARS-CoV-2 RNA is generally detectable in upper respiratory specimens during the acute phase of infection. The lowest concentration of SARS-CoV-2 viral copies this assay can detect is 138 copies/mL. A negative result does not preclude SARS-Cov-2 infection and should not be used as the sole basis for treatment or other patient management decisions. A negative result may occur with  improper specimen collection/handling, submission of specimen  other than nasopharyngeal swab, presence of viral mutation(s) within the areas targeted by this assay, and inadequate number of viral copies(<138 copies/mL). A negative result must be combined with clinical observations, patient history, and epidemiological information. The expected result is Negative.  Fact Sheet for Patients:  EntrepreneurPulse.com.au  Fact Sheet for Healthcare Providers:  IncredibleEmployment.be  This test is no t yet approved or cleared by the [place] FDA and  has been authorized for detection and/or diagnosis of SARS-CoV-2 by FDA under an Emergency Use Authorization (EUA). This EUA will remain  in effect (meaning this test can be used) for the duration of the COVID-19 declaration under Section 564(b)(1) of the Act, 21 U.S.C.section 360bbb-3(b)(1), unless the authorization is terminated  or revoked sooner.       Influenza A by PCR NEGATIVE NEGATIVE Final   Influenza B by PCR NEGATIVE NEGATIVE Final    Comment: (NOTE) The Xpert Xpress SARS-CoV-2/FLU/RSV plus assay is intended as an aid in the diagnosis of influenza from Nasopharyngeal swab specimens and should not be used as a sole basis for treatment. Nasal washings and aspirates are unacceptable for Xpert Xpress SARS-CoV-2/FLU/RSV testing.  Fact Sheet for Patients: EntrepreneurPulse.com.au  Fact Sheet for Healthcare Providers: IncredibleEmployment.be  This test is not yet approved or cleared by the [place] FDA and has been authorized for detection and/or diagnosis of SARS-CoV-2 by FDA under an Emergency Use Authorization (EUA). This EUA will  remain in effect (meaning this test can be used) for the duration of the COVID-19 declaration under Section 564(b)(1) of the Act, 21 U.S.C. section 360bbb-3(b)(1), unless the authorization is terminated or revoked.  Performed at [facility], [place] [address], [place],  [place] [address]   Blood culture (routine x 2)     Status: None   Collection Time: 04/04/21  4:40 PM   Specimen: BLOOD  Result Value Ref Range Status   Specimen Description BLOOD BLOOD RIGHT ARM  Final   Special Requests   Final    BOTTLES DRAWN AEROBIC ONLY Blood Culture adequate volume   Culture   Final    NO GROWTH 5 DAYS Performed at [facility], [place] [address] [place], [place] [address]    Report Status 04/09/2021 FINAL  Final  Blood culture (routine x 2)     Status: None   Collection Time: 04/05/21  3:59 AM   Specimen: BLOOD  Result Value Ref Range Status   Specimen Description BLOOD LEFT ANTECUBITAL  Final   Special Requests   Final    BOTTLES DRAWN AEROBIC AND ANAEROBIC Blood Culture results may not be optimal due to an excessive volume of blood received in culture bottles   Culture   Final    NO GROWTH 5 DAYS Performed at [facility], [place] [address] [place], [place] [address]    Report Status 04/10/2021 FINAL  Final  Resp Panel by RT-PCR (Flu A&B, Covid) Nasopharyngeal Swab     Status: None   Collection Time: 04/11/21 11:22 AM   Specimen: Nasopharyngeal Swab; Nasopharyngeal(NP) swabs in vial transport medium  Result Value Ref Range Status   SARS Coronavirus 2 by RT PCR NEGATIVE NEGATIVE Final    Comment: (NOTE) SARS-CoV-2 target nucleic acids are NOT DETECTED.  The SARS-CoV-2 RNA is generally detectable in upper respiratory specimens during the acute phase of infection. The lowest concentration of SARS-CoV-2 viral copies this assay can detect is 138 copies/mL. A negative result does not preclude SARS-Cov-2 infection and should not be used as the sole basis for treatment or other patient management decisions. A negative result may occur with  improper specimen collection/handling, submission of specimen other than nasopharyngeal swab, presence of viral mutation(s) within the areas targeted by this assay, and inadequate number of viral copies(<138  copies/mL). A negative result must be combined with clinical observations, patient history, and epidemiological information. The expected result is Negative.  Fact Sheet for Patients:  EntrepreneurPulse.com.au  Fact Sheet for Healthcare Providers:  IncredibleEmployment.be  This test is no t yet approved or cleared by the [place] FDA and  has been authorized for detection and/or diagnosis of SARS-CoV-2 by FDA under an Emergency Use Authorization (EUA). This EUA will remain  in effect (meaning this test can be used) for the duration of the COVID-19 declaration under Section 564(b)(1) of the Act, 21 U.S.C.section 360bbb-3(b)(1), unless the authorization is terminated  or revoked sooner.       Influenza A by PCR NEGATIVE NEGATIVE Final   Influenza B by PCR NEGATIVE NEGATIVE Final    Comment: (NOTE) The Xpert Xpress SARS-CoV-2/FLU/RSV plus assay is intended as an aid in the diagnosis of influenza from Nasopharyngeal swab specimens and should not be used as a sole basis for treatment. Nasal washings and aspirates are unacceptable for Xpert Xpress SARS-CoV-2/FLU/RSV testing.  Fact Sheet for Patients: EntrepreneurPulse.com.au  Fact Sheet for Healthcare Providers: IncredibleEmployment.be  This test is not yet approved or cleared  by the [place] and has been authorized for detection and/or diagnosis of SARS-CoV-2 by FDA under an Emergency Use Authorization (EUA). This EUA will remain in effect (meaning this test can be used) for the duration of the COVID-19 declaration under Section 564(b)(1) of the Act, 21 U.S.C. section 360bbb-3(b)(1), unless the authorization is terminated or revoked.  Performed at [facility], [place] [address] [place], [place] [address]     Impression/Plan:  1. Acute osteomyelitis - I reviewed the path report with Dr. Tyrell Antonio and margins with acute osteomyelitis and  resultant retained osteomyelitis post amputation.  She has been on prolonged IV antibiotics with dialysis since 03/24/21 and developed gangrenous changes.  She also underwent unsuccessful revascularization of the anterior tibial artery.   No cultures sent from the operative specimen so unclear micro.  There is a culture from a wound swab at dialysis.     At this point, can continue with cefepime with dialysis.  This has a high chance of relapse with above findings of poor vascularization and osteomyelitis and will be difficult to treat with medical therapy only.  Will plan on 4 further weeks of cefepime with dialysis through March 28th and can continue to follow with podiatry.      2.  Esrd - on hemodialysis and can get antibiotics with dialysis.    3.  COPD - on chronic oxygen.    Thanks for consultation

## 2021-04-12 NOTE — Progress Notes (Signed)
Pharmacy Antibiotic Note  Jody [name] is a 81 y.o. female admitted on 04/04/2021 with L-foot oste still with residual osteo s/p amputation. Pharmacy has been consulted for Ceftepime dosing with plans to continue with HD for 4 weeks through 3/28.   The patient is ERSD with HD on TTS - planning for HD today. Last Cefepime dose on 2/24.   Plan: - Resume Cefepime 2g on TTS at 1800 - Planned for 4 weeks through 3/28 - Will continue to follow HD schedule/duration, culture results, LOT, and antibiotic de-escalation plans   Height: 5\' 7"  (170.2 cm) Weight: 87.1 kg (192 lb 0.3 oz) IBW/kg (Calculated) : 61.6  Temp (24hrs), Avg:97.8 F (36.6 C), Min:97.5 F (36.4 C), Max:98.1 F (36.7 C)  Recent Labs  Lab 04/06/21 0454 04/07/21 0222 04/08/21 0714 04/09/21 0712 04/11/21 0232  WBC 4.0 6.2 7.1 6.0 7.2  CREATININE 3.58* 4.25* 3.32* 4.21* 4.11*    Estimated Creatinine Clearance: 11.8 mL/min (A) (by C-G formula based on SCr of 4.11 mg/dL (H)).    Allergies  Allergen Reactions   Codeine Nausea And Vomiting    Thank you for allowing pharmacy to be a part of this patients care.  Alycia Rossetti, PharmD, BCPS Infectious Diseases Clinical Pharmacist 04/12/2021 1:43 PM   **Pharmacist phone directory can now be found on [email or web address] ([email or web address]).  Listed under [place].

## 2021-04-12 NOTE — Progress Notes (Signed)
Pt came back to rm 13 from dialysis. Reinitiated tele. VSS. Call bell within reach.   Lavenia Atlas, RN

## 2021-04-12 NOTE — Progress Notes (Addendum)
[name] Progress Note   Subjective:  For HD today.  No complaints.     Objective Vitals:   04/11/21 2353 04/12/21 0410 04/12/21 0816 04/12/21 1114  BP: 117/70 (!) 104/52 91/65 92/66   Pulse: 79 95 77 75  Resp: 10 14 20 18   Temp: 98.1 F (36.7 C) 97.9 F (36.6 C) 97.6 F (36.4 C) (!) 97.5 F (36.4 C)  TempSrc:  Oral Oral Oral  SpO2:  92% 100% 98%  Weight:  87.1 kg    Height:       Physical Exam General: Elderly woman, NAD, sleeping but arousable Heart: Irreg irregular, no murmur Lungs: CTA anteriorly Abdomen: soft Extremities: No LE edema; L foot wrapped Dialysis Access: RUE AVF + bruit  Additional Objective Labs: Basic Metabolic Panel: Recent Labs  Lab 04/08/21 0714 04/09/21 0712 04/11/21 0232  NA 132* 133* 133*  K 3.7 4.1 4.9  CL 97* 97* 98  CO2 24 26 24   GLUCOSE 108* 95 104*  BUN 20 28* 25*  CREATININE 3.32* 4.21* 4.11*  CALCIUM 9.3 9.5 9.7  PHOS  --  4.3  --    Liver Function Tests: Recent Labs  Lab 04/09/21 0712  ALBUMIN 2.5*   CBC: Recent Labs  Lab 04/06/21 0454 04/07/21 0222 04/08/21 0714 04/09/21 0712 04/11/21 0232  WBC 4.0 6.2 7.1 6.0 7.2  HGB 6.8* 7.7* 8.1* 7.5* 8.2*  HCT 21.8* 24.5* 26.5* 24.0* 25.6*  MCV 112.4* 108.9* 108.6* 111.1* 109.9*  PLT 111* 106* 120* 112* 132*   Blood Culture    Component Value Date/Time   SDES BLOOD LEFT ANTECUBITAL 04/05/2021 0359   SPECREQUEST  04/05/2021 0359    BOTTLES DRAWN AEROBIC AND ANAEROBIC Blood Culture results may not be optimal due to an excessive volume of blood received in culture bottles   CULT  04/05/2021 0359    NO GROWTH 5 DAYS Performed at [facility], [place] [address], [place], [place] [address]    REPTSTATUS 04/10/2021 FINAL 04/05/2021 0359   Studies/Results: CT HEAD WO CONTRAST (5MM)  Result Date: 04/12/2021 CLINICAL DATA:  81 year old female with altered mental status. EXAM: CT HEAD WITHOUT CONTRAST TECHNIQUE: Contiguous axial images were obtained  from the base of the skull through the vertex without intravenous contrast. RADIATION DOSE REDUCTION: This exam was performed according to the departmental dose-optimization program which includes automated exposure control, adjustment of the mA and/or kV according to patient size and/or use of iterative reconstruction technique. COMPARISON:  Brain MRI 03/07/2021.  Head CT 03/03/2021. FINDINGS: Brain: No midline shift, mass effect, or evidence of intracranial mass lesion. No ventriculomegaly. Confluent bilateral cerebral white matter hypodensity with superimposed right parietal lobe cortical and subcortical encephalomalacia, stable from last month. No acute intracranial hemorrhage identified. No cortically based acute infarct identified. Vascular: Calcified atherosclerosis at the skull base. Skull: Motion artifact at the skull base. Stable visualized osseous structures. Congenital incomplete ossification of the posterior C1 ring. Sinuses/Orbits: Visualized paranasal sinuses and mastoids are stable and well aerated. Other: No acute orbit or scalp soft tissue finding. Calcified scalp vessel atherosclerosis. IMPRESSION: 1. No acute intracranial abnormality. 2. Chronic white matter disease and right parietal lobe encephalomalacia since last month. Electronically Signed   By: Genevie Ann M.D.   On: 04/12/2021 11:02   VAS [place] LOWER EXTREMITY VENOUS (DVT)  Result Date: 04/11/2021  Lower Venous DVT Study Patient Name:  Jody [name]  Date of Exam:   04/10/2021 Medical Rec #: [id number]  Accession #:    [id number] Date of Birth: Sep 27, 1940        Patient Gender: F Patient Age:   81 years Exam Location:  [facility] Procedure:      VAS [place] LOWER EXTREMITY VENOUS (DVT) Referring Phys: Celesta Gentile --------------------------------------------------------------------------------  Indications: Edema, and Pain.  Risk Factors: Status post left great toe amputation 04/08/2021. Comparison Study: No prior study Performing  Technologist: Sharion Dove RVS  Examination Guidelines: A complete evaluation includes B-mode imaging, spectral Doppler, color Doppler, and power Doppler as needed of all accessible portions of each vessel. Bilateral testing is considered an integral part of a complete examination. Limited examinations for reoccurring indications may be performed as noted. The reflux portion of the exam is performed with the patient in reverse Trendelenburg.  +-----+---------------+---------+-----------+------------------+--------------+  RIGHT Compressibility Phasicity Spontaneity Properties         Thrombus Aging  +-----+---------------+---------+-----------+------------------+--------------+  CFV   Full                                  pulsatile waveform                 +-----+---------------+---------+-----------+------------------+--------------+   +---------+---------------+---------+-----------+---------------+--------------+  LEFT      Compressibility Phasicity Spontaneity Properties      Thrombus Aging  +---------+---------------+---------+-----------+---------------+--------------+  CFV       Full                                  pulsatile                                                                        waveform                        +---------+---------------+---------+-----------+---------------+--------------+  SFJ       Full                                                                  +---------+---------------+---------+-----------+---------------+--------------+  FV Prox   Full                                                                  +---------+---------------+---------+-----------+---------------+--------------+  FV Mid    Full                                                                  +---------+---------------+---------+-----------+---------------+--------------+  FV Distal Full                                                                   +---------+---------------+---------+-----------+---------------+--------------+  PFV       Full                                                                  +---------+---------------+---------+-----------+---------------+--------------+  POP       Full                                  pulsatile                                                                        waveform                        +---------+---------------+---------+-----------+---------------+--------------+  PTV       Full                                                                  +---------+---------------+---------+-----------+---------------+--------------+  PERO                                                            Not well                                                                         visualized      +---------+---------------+---------+-----------+---------------+--------------+     Summary: RIGHT: - No evidence of common femoral vein obstruction.  LEFT: - There is no evidence of deep vein thrombosis in the lower extremity. However, portions of this examination were limited- see technologist comments above.  - Ultrasound characteristics of enlarged lymph nodes noted in the groin. Diffuse interstitial edema noted throughout the left lower extremity. Waveforms are pulsatile suggestive of fluid overload.  *See table(s) above for measurements and observations. Electronically signed by Servando Snare MD on 04/11/2021 at 2:39:07 PM.    Final    Medications:  sodium chloride     sodium chloride     metronidazole 500 mg (04/12/21 0437)    (feeding supplement) PROSource Plus  30 mL Oral BID BM   acetaminophen  500 mg Oral TID   apixaban  2.5 mg Oral BID   aspirin EC  81 mg Oral Daily   atorvastatin  40 mg Oral Daily   Chlorhexidine Gluconate Cloth  6 each Topical Daily  Chlorhexidine Gluconate Cloth  6 each Topical Daily   clopidogrel  75 mg Oral Q breakfast   darbepoetin (ARANESP) injection - DIALYSIS  200 mcg  Intravenous Q Thu-HD   ferrous sulfate  325 mg Oral BID WC   gabapentin  100 mg Oral QHS   insulin aspart  0-6 Units Subcutaneous TID WC   metoprolol tartrate  12.5 mg Oral BID   pramipexole  0.125 mg Oral Daily   sevelamer carbonate  800 mg Oral TID WC   sodium chloride flush  3 mL Intravenous Q12H   sodium chloride flush  3 mL Intravenous Q12H   vitamin B-12  100 mcg Oral Daily    OP HD: TTS [facility] - [facility] 11 on 05/11/20. 3h 50min   79.5kg     2K/2.5Ca bath  RUE AVF  15g   Hep none - Hep B SAg neg 2/20, COVID neg 2/20 - Calcitriol 1.80mcg PO q HD - Mircera 212mcg IV q 2 weeks - Venofer 50mg  IV weekly - On Vanc 1g and Cefepime 2g since 2/9. Wound Cx at OP clinic: Finegoldia magna   Assessment/Plan:  L foot osteomyelitis: IV abx broad-spec since admit on 2/20 (van'c cefepime/ flagyl) Afebrile. BCxs negative. SP L great toe amp on 2/24. Stop date 05/10/21- antibiotics can be given at [facility]. PAD: ABIs abnormal, s/p angioplasty to L SFA by [facility] on 2/22.  ESRD: on HD TTS. Next HD 2/28.   Hypertension/volume: overall soft BP. Diltiazem stopped, getting metoprolol bid. Wt's appear inaccurate, up 7kg but not vol overloaded by exam and BP's soft.   Anemia: Hgb 7.7, s/p 1U PRBCs on 2/22. No IV iron d/t infection.  SP darbe 200 ug on 2/23. Ordered weekly.   Metabolic bone disease: Ca 10.2 on admit, VDRA on hold. Continue Renvela as binder.  Nutrition:  Alb low (3.1) - continue supplements. A-fib: On metoprolol 12.5 bid, po diltiazem and eliquis dc'd Memory loss - confused, prob delirium, seems better today COPD Hx CVA  Jody Lips, MD 04/12/2021, 12:55 PM

## 2021-04-12 NOTE — TOC Progression Note (Signed)
Transition of Care [facility]) - Progression Note    Patient Details  [name] MRN: [id number] Date of Birth: January 19, 1941  [facility] CM/SW [place], [place] Phone Number: 04/12/2021, 9:58 AM  Clinical Narrative:     Patients insurance called CSW and requested another PT note. Patients insurance informed CSW patient was lethargic yesterday.CSW informed MD. PT confirmed they will see patient tomorrow for current PT note. Insurance also asked if patient will be on IV antibiotics at SNF. CSW asked MD. MD confirmed TBD. CSW informed insurance and will provide updated PT note and requested information when able. Patient has SNF bed at [facility] authorization is pending. CSW will continue to follow and assist with patients dc planning needs.  Expected Discharge Plan: [place] Barriers to Discharge: Continued Medical Work up  Expected Discharge Plan and Services Expected Discharge Plan: [place] In-house Referral: Clinical Social Work     Living arrangements for the past 2 months: [place]                                       Social Determinants of Health (SDOH) Interventions    Readmission Risk Interventions Readmission Risk Prevention Plan 12/30/2019 10/27/2019  Transportation Screening Complete Complete  PCP or Specialist Appt within 5-7 Days - Complete  Home Care Screening - Complete  Medication Review (RN CM) - Complete  HRI or Home Care Consult Complete -  Social Work Consult for Recovery Care Planning/Counseling Complete -  Palliative Care Screening Not Complete -  Medication Review Press photographer) Complete -  Some recent data might be hidden

## 2021-04-12 NOTE — Progress Notes (Signed)
Progress Note   Patient: Jody [name] QQV:[id number] DOB: 11-28-1940 DOA: 04/04/2021     8 DOS: the patient was seen and examined on 04/12/2021   Brief hospital course: 81 year old past medical history significant for ESRD on hemodialysis TTS, diabetes type 2, A-fib, COPD on chronic 2 L of oxygen, diastolic heart failure, anemia of chronic kidney disease, hypertension, history of CVA who presents to the ED with concern of left foot osteomyelitis/PAD.  She was sent from podiatry clinic.  Per patient she has been having left foot problem for about 2 weeks.  Patient has been getting IV antibiotics for the last 2 weeks with hemodialysis as an outpatient.  Culture grew Finegoldia magna.   MRI of the left foot: Show abnormal marrow signal of the head of the first metatarsal and base of the proximal phalanx of the first digit, which in the presence of adjacent deep skin wound is consistent with acute osteomyelitis/septic arthritis.  Deep skin wound about the medial aspect of the first metatarsal head.  Underwent arteriogram with SFA drug coated balloon angioplasty and unsuccessful recannulization of the ATA.  Underwent Left first Toe amputation 2/24.  Pathology: Gangrenous cutaneous ulcer with underlying gangrenous cellulitis  extending to bone showing marked acute osteomyelitis  Acute osteomyelitis present within bone margin and separate portion of  bone skin and soft tissue margin grossly viable. ID consulted. Recommending 4 weeks of Cefepime with HD>   AMS; at times she is more alert per nurse. Suspect delirium. Stop gabapentin.PCO2 mildly elevated. Start BIPAP at HS or with naps. CT head negative.       Assessment and Plan: * Acute osteomyelitis of left foot ([place])- (present on admission) Presents with Acute osteomyelitis of left big toe, failed IV abx outpatient. -Has been receiving IV vancomycin and cefepime at dialysis since 03/24/21. -Wound culture grew out finegoldia magna. -Continue with  Vancomycin, Cefepime and flagyl.  -Dr Jacqualyn Posey recommend Partial first ray amputation.  -ABI: Right and left resting right ankle-brachial index indicate noncompressible right lower extremity arteries.  Right and left toe brachial index is abnormal. -Underwent  arteriogram 2/22: Successful drug-coated balloon angioplasty of the superficial femoral artery. Unsuccessful recanalization of the anterior tibial artery. -Underwent first toe left foot amputation 2/24.   -ID consult osteomyelitis positive  surgical margined.  ID recommend 4 week of cefepime with HD>   Leg pain Complaints of leg pain. Likely related to Sx.  Doppler negative for DVT  Acute metabolic encephalopathy She has been more confuse. Suspect hospital delirium, medication.  Delirium precaution.  Ammonia mildly elevated at 43. Started on lactulose.  Reduce gabapentin to daily.  ABG; PCO2: 56, PH compensated. Plan for BIPAP at HS and with naps.  Her mental status fluctuates. Per nurse she was alert and conversant  yesterday afetrnoon  Sinus bradycardia Asymptomatic.  Hold Cardizem  Holder parameter for metoprolol.  Improved.   Hypokalemia Correction with HD.   Memory loss [place], asked that she be called for medical decisions.  Delirium precautions  B12: 358, started  Supplement.  Hyponatremia Likely secondary to renal failure. Continue to follow.  Anemia of chronic kidney failure Baseline 7-8 No IV iron with infection. On Aranesp.  Received one unit PRBC 2/22. Hb down to 7.5 monitor.   Chronic respiratory failure (HCC) Stable on 2 L oxygen.   History of CVA (cerebrovascular accident) On /eliquis,   ESRD on dialysis ([place]) On dialysis TTS. Nephrology following.  Continue with HD>   Type 2 diabetes mellitus with nephropathy (  [place])- (present on admission) a1c of 5.3 in 02/2021. SSI   Chronic diastolic CHF (congestive heart failure) ([place])- (present on admission) Last echo: 06/2020: EF of 45-50%  with mildly reduced LVF. Hypokinesis of the inferoseptal, inferolateral and distal inferior walls . Diastolic parameters indeterminate.  Volume management with HD  Discontinue lasix.   Atrial fibrillation ([place])- (present on admission) Rate controlled. Holding Cardizem and Metoprolol due to Bradycardia.  Continue with Eliquis.   COPD with chornic respiratory failure on oxygen 2L [place]- (present on admission) On chronic 2L oxygen, continue No signs of exacerbation. Continue with albuterol prn   Essential hypertension- (present on admission) Hold  cardizem 120mg  daily and holder parameter for metoprolol due to bradycardia         Subjective: she is sleepy. Per nurse she was awake and conversant in the afternoon.   Physical Exam: Vitals:   04/12/21 1530 04/12/21 1557 04/12/21 1634 04/12/21 1700  BP: 97/60 (!) 100/59 110/67 (!) 117/56  Pulse:    80  Resp: 19 (!) 21 20 20   Temp:    98 F (36.7 C)  TempSrc:    Oral  SpO2:    96%  Weight:      Height:       General; NAD, sleepy, wake up answer questions.  CVS; S 1 S 2 RRR Lungs; CTA Left LE with dressing  Data Reviewed:  Cbc and bmet  Family Communication: Niece over phone 2/28  Disposition: Status is: Inpatient Remains inpatient appropriate because: management osteomyelitis, and AMS          Planned Discharge Destination: Skilled nursing facility     Time spent: 45 minutes  Author: Elmarie Shiley, MD 04/12/2021 7:34 PM  For on call review [email or web address].

## 2021-04-13 ENCOUNTER — Ambulatory Visit: Payer: Medicare Other | Admitting: Cardiology

## 2021-04-13 DIAGNOSIS — I5032 Chronic diastolic (congestive) heart failure: Secondary | ICD-10-CM

## 2021-04-13 DIAGNOSIS — I4819 Other persistent atrial fibrillation: Secondary | ICD-10-CM

## 2021-04-13 DIAGNOSIS — I4729 Other ventricular tachycardia: Secondary | ICD-10-CM

## 2021-04-13 LAB — GLUCOSE, CAPILLARY
Glucose-Capillary: 96 mg/dL (ref 70–99)
Glucose-Capillary: 117 mg/dL — ABNORMAL HIGH (ref 70–99)
Glucose-Capillary: 133 mg/dL — ABNORMAL HIGH (ref 70–99)
Glucose-Capillary: 139 mg/dL — ABNORMAL HIGH (ref 70–99)

## 2021-04-13 MED ORDER — CYANOCOBALAMIN 100 MCG PO TABS: 100.0000 ug | ORAL_TABLET | Freq: Every day | ORAL | 0 refills | Status: DC

## 2021-04-13 MED ORDER — SODIUM CHLORIDE 0.9 % IV SOLN: 2.0000 g | INTRAVENOUS | 0 refills | Status: DC

## 2021-04-13 MED ORDER — ACETAMINOPHEN 500 MG PO TABS: 500.0000 mg | ORAL_TABLET | Freq: Three times a day (TID) | ORAL | 0 refills | Status: DC

## 2021-04-13 MED ORDER — ATORVASTATIN CALCIUM 40 MG PO TABS
40.0000 mg | ORAL_TABLET | Freq: Every day | ORAL | 0 refills | Status: DC
Start: 2021-04-14 — End: 2021-07-04

## 2021-04-13 MED ORDER — GABAPENTIN 100 MG PO CAPS: 100.0000 mg | ORAL_CAPSULE | Freq: Every day | ORAL | 0 refills | Status: DC

## 2021-04-13 MED ORDER — ASPIRIN 81 MG PO TBEC: 81.0000 mg | DELAYED_RELEASE_TABLET | Freq: Every day | ORAL | 11 refills | Status: DC

## 2021-04-13 NOTE — Progress Notes (Signed)
Mobility Specialist Progress Note ? ? 04/13/21 1700  ?Mobility  ?Activity Transferred from chair to bed  ?Level of Assistance +2 (takes two people)  ?Assistive Device MaxiMove  ?Activity Response Tolerated well  ?$Mobility charge 1 Mobility  ? ?Tx'd from chair to bed via maximove w/ no complaints or faults. Left w/ NT and RN in room. ? ?Holland Falling ?Mobility Specialist ?Phone Number [phone] ? ?

## 2021-04-13 NOTE — Progress Notes (Addendum)
D/C order noted. Contacted [employer] [employer] and spoke to [place]. Clinic advised pt for d/c today and should resume care tomorrow. Clinic aware pt needs iv cefepime with HD. ID note from yesterday faxed with recommendations noted. D/C summary faxed today with iv abx noted as well.  ? ?Melven Sartorius ?Renal Navigator ?[phone] ? ?Addendum at 3:39 pm: ?Advised by MD and CSW that pt's insurance auth to return to snf is still pending. Contacted [facility] [employer] and spoke to [place]. Clinic aware that pt will likely not be at clinic tomorrow for treatment due to the above.  ?

## 2021-04-13 NOTE — Consult Note (Signed)
[facility] CM Inpatient Consult ? ? ?04/13/2021 ? ?Jody [name] ?February 01, 1941 ?[id number] ? ?[facility] CM) ?  ?Patient was reviewed for less than 30 days unplanned readmission and high unplanned readmission risk score. Assessed for [facility] CM post hospital chronic care management needs. Per review, current plan is for SNF. No [facility] CM needs. ? ?Of note, [facility] Care Management services does not replace or interfere with any services that are arranged by inpatient case management or social work.  ? ?Netta Cedars, MSN, RN ?[facility] Liaison ?Mobile Phone [phone]  ?Toll free office [phone]  ?

## 2021-04-13 NOTE — Progress Notes (Signed)
Physical Therapy Treatment ?Patient Details ?[name] ?MRN: [id number] ?DOB: 29-Apr-1940 ?Today's Date: 04/13/2021 ? ? ?History of Present Illness 81 y.o. female presents to [facility] hospital on 04/04/2021 with concerns for osteomyelitis of L foot. S/p angiogram with SFA drug coated balloon angioplasty and unsuccessful recannulization of the ATA 2/22. S/p L first ray amp on 2/24. Ultrasound taken of lower extremities 2/26 negative for DVT.  PMH includes ESRD, DMII, Afib, COPD, CHF, HTN, CVA. ? ?  ?PT Comments  ? ? Pt received in supine, alert and oriented to self and pleasantly confused and cooperative. Pt continues to verbalize fear of falls but participates well in transfer training with gentle encouragement, needing up to maxA +2 for partial stand and max to totalA +2 for squat pivot to chair. Pt demonstrates upright sitting at EOB with improved tolerance today, needing up to minA for trunk support with weight shifting/reaching tasks. Pt participated in >20 mins of activity today including seated tasks and functional transfer training. Pt continues to benefit from PT services to progress toward functional mobility goals.     ?Recommendations for follow up therapy are one component of a multi-disciplinary discharge planning process, led by the attending physician.  Recommendations may be updated based on patient status, additional functional criteria and insurance authorization. ? ?Follow Up Recommendations ? Skilled nursing-short term rehab (<3 hours/day) ?  ?  ?Assistance Recommended at Discharge Frequent or constant Supervision/Assistance  ?Patient can return home with the following Two people to help with walking and/or transfers;Two people to help with bathing/dressing/bathroom;Assistance with cooking/housework;Assistance with feeding;Direct supervision/assist for medications management;Direct supervision/assist for financial management;Assist for transportation;Help with stairs or ramp for entrance ?   ?Equipment Recommendations ? Other (comment) (defer to next venue of care)  ?  ?Recommendations for Other Services   ? ? ?  ?Precautions / Restrictions Precautions ?Precautions: Fall;Other (comment) ?Precaution Comments: close vitals monitor ?Restrictions ?Weight Bearing Restrictions: No  ?  ? ?Mobility ? Bed Mobility ?Overal bed mobility: Needs Assistance ?Bed Mobility: Rolling, Sidelying to Sit, Sit to Supine ?Rolling: Mod assist ?Sidelying to sit: Max assist, HOB elevated, +2 for physical assistance ?  ?  ?  ?General bed mobility comments: pt heavily reliant on use of bed rails and features, needs cues for sequencing, technique. MaxA for anterior hip scooting to foot flat and mod to maxA trunk steadying upon rising due to posterior lean ?  ? ?Transfers ?Overall transfer level: Needs assistance ?Equipment used: 2 person hand held assist, Ambulation equipment used ?Transfers: Sit to/from Stand, Bed to chair/wheelchair/BSC ?Sit to Stand: Max assist, +2 physical assistance ?  ?  ?Squat pivot transfers: Total assist, +2 physical assistance ?  ?  ?General transfer comment: Pt able to raise hips ~3" from recliner with +2 maxA to [place], lift pad placed under her at end of session to ensure safe return to bed later in day when nursing staff assists her. Pt able to initiate squat pivot transfer with +2 HHA and BLE blocked but needs totalA +2 to complete due to weakness/poor technique, staff assist with bed pad under her hips to ensure safety ?  ? ? ? ? ?  ?Balance Overall balance assessment: Needs assistance ?Sitting-balance support: No upper extremity supported, Feet supported ?Sitting balance-Leahy Scale: Fair ?Sitting balance - Comments: Static sitting and weight shifting EOB with mostly min guard to minA trunk support ?Postural control: Posterior lean ?Standing balance support: Bilateral upper extremity supported ?Standing balance-Leahy Scale: Zero ?Standing balance comment: totalA +2 for partial standing,  unable to  reach full upright posture ?  ?  ?  ? ?  ?Cognition Arousal/Alertness: Awake/alert ?Behavior During Therapy: Restless, Anxious ?Overall Cognitive Status: No family/caregiver present to determine baseline cognitive functioning ?  ?  ?  ?  ?  ?  ?  ?General Comments: Pt very alert, not oriented to month/year, states her age as 21, reports she is at [facility]. Pt reoriented to location/time. She verbalizes fear of falls frequently but puts forth effort for all mobility tasks when reassured. Pt tangential needs cues for attention to task. ?  ?  ? ?  ?Exercises Other Exercises ?Other Exercises: BLE AROM: LAQ x5 reps ? ?  ?General Comments General comments (skin integrity, edema, etc.): SpO2 with poor waveform, on RA desat to 88% at rest so replaced and reading 100% on 3L O2 [place] when signal improves ?  ?  ? ?Pertinent Vitals/Pain Pain Assessment ?Pain Assessment: Faces ?Faces Pain Scale: Hurts little more ?Pain Location: LLE tender to palpation, pt reports intermittent low back pain but unclear if it is currently hurting due to pt cognitive deficit ?Pain Descriptors / Indicators: Discomfort, Grimacing, Guarding ?Pain Intervention(s): Monitored during session, Repositioned  ? ? ? ?PT Goals (current goals can now be found in the care plan section) Acute Rehab PT Goals ?Patient Stated Goal: to sit up more ?PT Goal Formulation: With patient/family ?Time For Goal Achievement: 04/21/21 ?Progress towards PT goals: Progressing toward goals ? ?  ?Frequency ? ? ? Min 2X/week ? ? ? ?  ?PT Plan Current plan remains appropriate  ? ? ?Co-evaluation PT/OT/SLP Co-Evaluation/Treatment: Yes ?Reason for Co-Treatment: Necessary to address cognition/behavior during functional activity;For patient/therapist safety;To address functional/ADL transfers ?PT goals addressed during session: Mobility/safety with mobility;Balance;Strengthening/ROM ?  ?  ? ?  ?AM-PAC PT "6 Clicks" Mobility   ?Outcome Measure ? Help needed turning from your  back to your side while in a flat bed without using bedrails?: A Lot ?Help needed moving from lying on your back to sitting on the side of a flat bed without using bedrails?: A Lot ?Help needed moving to and from a bed to a chair (including a wheelchair)?: Total ?Help needed standing up from a chair using your arms (e.g., wheelchair or bedside chair)?: Total ?Help needed to walk in hospital room?: Total ?Help needed climbing 3-5 steps with a railing? : Total ?6 Click Score: 8 ? ?  ?End of Session Equipment Utilized During Treatment: Oxygen;Gait belt ?Activity Tolerance: Patient tolerated treatment well ?Patient left: with call bell/phone within reach;in chair;with chair alarm set ?Nurse Communication: Mobility status;Other (comment);Need for lift equipment (chair alarm on, lift pad underneath use lift with +2 pt unable to stand from recliner with [place]) ?PT Visit Diagnosis: Unsteadiness on feet (R26.81);Muscle weakness (generalized) (M62.81);History of falling (Z91.81);Difficulty in walking, not elsewhere classified (R26.2) ?  ? ? ?Time: 0349-1791 ?PT Time Calculation (min) (ACUTE ONLY): 28 min ? ?Charges:  $Therapeutic Activity: 8-22 mins          ?          ? ?Marilla Boddy P., PTA ?Acute Rehabilitation Services ?Pager: [phone] ?Office: [phone]  ? ? ?Kara Pacer Jazyiah Yiu ?04/13/2021, 11:27 AM ? ?

## 2021-04-13 NOTE — TOC Progression Note (Signed)
Transition of Care (TOC) - Progression Note  ? ? ?Patient Details  ?[name] ?MRN: [id number] ?Date of Birth: March 25, 1940 ? ?Transition of Care (TOC) CM/SW Contact  ?Vinie Sill, LCSW ?Phone Number: ?04/13/2021, 12:32 PM ? ?Clinical Narrative:    ? ?Called informed insurance -updated clinicals has been provided - insurance remains pending at this time. ? ?TOC will continue to follow and assist with discharge planning. ? ?Thurmond Butts, MSW, LCSW ?Clinical Social Worker ? ? ? ?Expected Discharge Plan: [place] ?Barriers to Discharge: Continued Medical Work up ? ?Expected Discharge Plan and Services ?Expected Discharge Plan: [place] ?In-house Referral: Clinical Social Work ?  ?  ?Living arrangements for the past 2 months: [place] ?                ?  ?  ?  ?  ?  ?  ?  ?  ?  ?  ? ? ?Social Determinants of Health (SDOH) Interventions ?  ? ?Readmission Risk Interventions ?Readmission Risk Prevention Plan 12/30/2019 10/27/2019  ?Transportation Screening Complete Complete  ?PCP or Specialist Appt within 5-7 Days - Complete  ?Home Care Screening - Complete  ?Medication Review (RN CM) - Complete  ?[place] or Home Care Consult Complete -  ?Social Work Consult for [place] Planning/Counseling Complete -  ?Palliative Care Screening Not Complete -  ?Medication Review Press photographer) Complete -  ?Some recent data might be hidden  ? ? ?

## 2021-04-13 NOTE — Discharge Summary (Addendum)
Physician Discharge Summary   Patient: Jody [name] MRN: [id number] DOB: [birth year]  Admit date:     04/04/2021  Discharge date: 04/15/2021  Discharge Physician: Nolberto Hanlon   PCP: Monico Blitz, MD   Recommendations at discharge:   Needs to complete 4 weeks of IV cefepime with HD, nephrology will assist arranging antibiotics.  Needs to follow up with Vascular post angiogram Needs to follow up with Dr Jacqualyn Posey post amputation left first toe.  She will be discharge on aspirin and Eliquis. Stop Plavix at discharge, discussed with Dr Donzetta Matters.  HD TTS at [facility]. 6. Antibiotic Stop date 05/10/21- antibiotics (vanc/ cefepime) can be given at [facility].Nephrology will arrange. 7.will be partial weightbearing to the heel.   Discharge Diagnoses: Principal Problem:   Acute osteomyelitis of left foot ([place]) Active Problems:   Essential hypertension   COPD with chornic respiratory failure on oxygen 2L [place]   Atrial fibrillation (HCC)   Chronic diastolic CHF (congestive heart failure) (HCC)   Type 2 diabetes mellitus with nephropathy (HCC)   ESRD on dialysis ([place])   History of CVA (cerebrovascular accident)   Chronic respiratory failure (HCC)   Anemia of chronic kidney failure   Hyponatremia   Memory loss   Hypokalemia   Sinus bradycardia   Acute metabolic encephalopathy   Leg pain  Resolved Problems:   Hyperlipidemia   Hospital Course: 81 year old past medical history significant for ESRD on hemodialysis TTS, diabetes type 2, A-fib, COPD on chronic 2 L of oxygen, diastolic heart failure, anemia of chronic kidney disease, hypertension, history of CVA who presents to the ED with concern of left foot osteomyelitis/PAD.  She was sent from podiatry clinic.  Per patient she has been having left foot problem for about 2 weeks.  Patient has been getting IV antibiotics for the last 2 weeks with hemodialysis as an outpatient.  Culture grew Finegoldia magna.   MRI of the left foot: Show abnormal  marrow signal of the head of the first metatarsal and base of the proximal phalanx of the first digit, which in the presence of adjacent deep skin wound is consistent with acute osteomyelitis/septic arthritis.  Deep skin wound about the medial aspect of the first metatarsal head.  Underwent arteriogram with SFA drug coated balloon angioplasty and unsuccessful recannulization of the ATA.  Underwent Left first Toe amputation 2/24.  Pathology: Gangrenous cutaneous ulcer with underlying gangrenous cellulitis  extending to bone showing marked acute osteomyelitis  Acute osteomyelitis present within bone margin and separate portion of  bone skin and soft tissue margin grossly viable. ID consulted. Recommending 4 weeks of Cefepime with HD>   AMS; at times she is more alert per nurse. Suspect delirium. Stop gabapentin.PCO2 mildly elevated. Started  BIPAP at HS or with naps. CT head negative. She didn't use BIPAP last night.   She is alert, conversant, following command, Delirium Improved. She is stable to be transfer to SNF>      Addendum 3/3 no overnight events. No complaints this am.Stable to be discharged.    Assessment and Plan: * Acute osteomyelitis of left foot ([place]) Presents with Acute osteomyelitis of left big toe, failed IV abx outpatient. -Has been receiving IV vancomycin and cefepime at dialysis since 03/24/21. -Wound culture grew out finegoldia magna. -Continue with Cefepime with HD -Dr Jacqualyn Posey recommend Partial first ray amputation.  -ABI: Right and left resting right ankle-brachial index indicate noncompressible right lower extremity arteries.  Right and left toe brachial index is abnormal. -Underwent  arteriogram 2/22:  Successful drug-coated balloon angioplasty of the superficial femoral artery. Unsuccessful recanalization of the anterior tibial artery. -Underwent first toe left foot amputation 2/24.   -ID consult osteomyelitis positive  surgical margined.  -ID recommend 4 week of  cefepime with HD>   Leg pain Complaints of leg pain. Likely related to Sx.  Doppler negative for DVT  Acute metabolic encephalopathy She has been more confuse. Suspect hospital delirium, medication, infection.  Delirium precaution.  Ammonia mildly elevated at 43. Received lactulose.   Reduce gabapentin to daily.  ABG; PCO2: 56, PH compensated.  BIPAP at HS and with naps. Didn't tolerated. She is more alert.  Her mental status fluctuates. Per nurse she was alert and conversant  2/27 afternoon. 3/01: she is alert, oriented, conversant. Stable for discharge.   Sinus bradycardia Asymptomatic.  Hold Cardizem  Holder parameter for metoprolol.  Improved.   Hypokalemia Correction with HD.   Memory loss [place], asked that she be called for medical decisions.  Delirium precautions  B12: 358, started  Supplement.  Hyponatremia Likely secondary to renal failure. Continue to follow.  Anemia of chronic kidney failure Baseline 7-8 No IV iron with infection. On Aranesp.  Received one unit PRBC 2/22. Hb 8.2  Chronic respiratory failure (HCC) Stable on 2 L oxygen.   History of CVA (cerebrovascular accident) On /eliquis,   ESRD on dialysis ([place]) On dialysis TTS. Nephrology following.  Continue with HD>   Type 2 diabetes mellitus with nephropathy (HCC) a1c of 5.3 in 02/2021. SSI   Chronic diastolic CHF (congestive heart failure) ([place]) Last echo: 06/2020: EF of 45-50% with mildly reduced LVF. Hypokinesis of the inferoseptal, inferolateral and distal inferior walls . Diastolic parameters indeterminate.  Volume management with HD  Discontinue lasix.   Atrial fibrillation ([place]) Rate controlled. Holding Cardizem and Metoprolol due to Bradycardia.  Continue with Eliquis.   COPD with chornic respiratory failure on oxygen 2L [place] On chronic 2L oxygen, continue No signs of exacerbation. Continue with albuterol prn   Essential hypertension Hold  cardizem 120mg  daily and  holder parameter for metoprolol due to bradycardia    Anemia: s/p 1U PRBCs on 2/22. No IV iron d/t infection.  SP darbe 200 ug on 2/23. Ordered weekly        Consultants: ID, Vascular, Podiatry  Procedures performed: arteriogram, Left first toe amputation.  Disposition: Skilled nursing facility Diet recommendation: Dysphagia 2 Diet.  Discharge Diet Orders (From admission, onward)     Start     Ordered   04/13/21 0000  Diet - low sodium heart healthy        04/13/21 1234             DISCHARGE MEDICATION: Allergies as of 04/15/2021       Reactions   Codeine Nausea And Vomiting        Medication List     STOP taking these medications    diltiazem 120 MG 24 hr capsule Commonly known as: CARDIZEM CD   furosemide 40 MG tablet Commonly known as: LASIX       TAKE these medications    acetaminophen 500 MG tablet Commonly known as: TYLENOL Take 1 tablet (500 mg total) by mouth 3 (three) times daily.   albuterol 108 (90 Base) MCG/ACT inhaler Commonly known as: VENTOLIN HFA Inhale 1-2 puffs into the lungs every 6 (six) hours as needed for wheezing or shortness of breath. Reported on 03/01/2015   albuterol 0.63 MG/3ML nebulizer solution Commonly known as: ACCUNEB Take 1  ampule by nebulization every 6 (six) hours as needed for wheezing.   apixaban 2.5 MG Tabs tablet Commonly known as: ELIQUIS Take 2.5 mg by mouth 2 (two) times daily.   aspirin 81 MG EC tablet Take 1 tablet (81 mg total) by mouth daily. Swallow whole.   atorvastatin 40 MG tablet Commonly known as: LIPITOR Take 1 tablet (40 mg total) by mouth daily.   ceFEPIme 2 g in sodium chloride 0.9 % 100 mL Inject 2 g into the vein every Tuesday, Thursday, and Saturday at 6 PM.   cyanocobalamin 100 MCG tablet Take 1 tablet (100 mcg total) by mouth daily.   ferrous sulfate 325 (65 FE) MG tablet Take 325 mg by mouth 2 (two) times daily with a meal.   gabapentin 100 MG capsule Commonly known as:  NEURONTIN Take 1 capsule (100 mg total) by mouth at bedtime. What changed: when to take this   metoprolol tartrate 25 MG tablet Commonly known as: LOPRESSOR Take 0.5 tablets (12.5 mg total) by mouth 2 (two) times daily.   pramipexole 0.125 MG tablet Commonly known as: MIRAPEX Take 0.125 mg by mouth daily.   sevelamer carbonate 800 MG tablet Commonly known as: RENVELA Take 800 mg by mouth 3 (three) times daily with meals.               Discharge Care Instructions  (From admission, onward)           Start     Ordered   04/15/21 0000  Discharge wound care:       Comments: As above   04/15/21 0933   04/13/21 0000  Discharge wound care:       Comments: See above   04/13/21 1234             Discharge Exam: Filed Weights   04/14/21 0702 04/14/21 1000 04/15/21 0324  Weight: 87.3 kg 86.5 kg 84.6 kg   Nad, calm Cta no w/r Reg s1/s2 no gallop Soft benign +bs No edema, Lt dressing on   Condition at discharge: stable  The results of significant diagnostics from this hospitalization (including imaging, microbiology, ancillary and laboratory) are listed below for reference.   Imaging Studies: CT HEAD WO CONTRAST (5MM)  Result Date: 04/12/2021 CLINICAL DATA:  81 year old female with altered mental status. EXAM: CT HEAD WITHOUT CONTRAST TECHNIQUE: Contiguous axial images were obtained from the base of the skull through the vertex without intravenous contrast. RADIATION DOSE REDUCTION: This exam was performed according to the departmental dose-optimization program which includes automated exposure control, adjustment of the mA and/or kV according to patient size and/or use of iterative reconstruction technique. COMPARISON:  Brain MRI 03/07/2021.  Head CT 03/03/2021. FINDINGS: Brain: No midline shift, mass effect, or evidence of intracranial mass lesion. No ventriculomegaly. Confluent bilateral cerebral white matter hypodensity with superimposed right parietal lobe  cortical and subcortical encephalomalacia, stable from last month. No acute intracranial hemorrhage identified. No cortically based acute infarct identified. Vascular: Calcified atherosclerosis at the skull base. Skull: Motion artifact at the skull base. Stable visualized osseous structures. Congenital incomplete ossification of the posterior C1 ring. Sinuses/Orbits: Visualized paranasal sinuses and mastoids are stable and well aerated. Other: No acute orbit or scalp soft tissue finding. Calcified scalp vessel atherosclerosis. IMPRESSION: 1. No acute intracranial abnormality. 2. Chronic white matter disease and right parietal lobe encephalomalacia since last month. Electronically Signed   By: Genevie Ann M.D.   On: 04/12/2021 11:02   MR FOOT LEFT WO CONTRAST  Result  Date: 04/04/2021 CLINICAL DATA:  Ulcer on medial left great toe, pain, infection suspected. EXAM: MRI OF THE LEFT FOOT WITHOUT CONTRAST TECHNIQUE: Multiplanar, multisequence MR imaging of the left forefoot was performed. No intravenous contrast was administered. COMPARISON:  Radiographs dated April 04, 2021 FINDINGS: Multiple sequences are degraded due to motion. Bones/Joint/Cartilage Hyperintense signal on T2 and STIR sequences in the first metatarsal head and base of the proximal phalanx of the first digit with corresponding hypointense signal on T1, which in the presence of adjacent deep skin wound is consistent with acute osteomyelitis. Ligaments Lisfranc ligament is intact. Evaluation of collateral ligaments is somewhat limited due to motion. Muscles and Tendons Flexor, peroneal and extensor compartment tendons are intact. Hyperintense intramuscular signal of the plantar muscles concerning for diabetic myopathy/myositis. No drainable fluid collection. Soft tissue No fluid collection or hematoma. No soft tissue mass. Generalized subcutaneous soft tissue edema about dorsum of the foot. IMPRESSION: 1. Abnormal marrow signal of the head of the first  metatarsal and base of the proximal phalanx of the first digit, which in the presence of adjacent deep skin wound is consistent with acute osteomyelitis/septic arthritis. 2. Deep skin wound about the medial aspect of the first metatarsal head. Soft tissue swelling about the dorsum of the foot. Electronically Signed   By: Keane Police D.O.   On: 04/04/2021 15:21   PERIPHERAL VASCULAR CATHETERIZATION  Result Date: 04/06/2021 Images from the original result were not included. Patient [name] MRN: [id number] DOB: 1940/10/02 Sex: female 04/06/2021 Pre-operative Diagnosis: Left lower extremity Rutherford 5 critical limb ischemia Post-operative diagnosis:  Same Surgeon:  Broadus John, MD Procedure Performed: 1.  Ultrasound-guided micropuncture access of the right common femoral artery 2.  Aortogram 3.  Second-order cannulation, left lower extremity angiogram 4.  SFA drug-coated balloon angioplasty using a 4 x 150 mm [place] balloon 5.  Ultrasound-guided micropuncture access of the left dorsalis pedis artery 6.  Device assisted closure-Pro-glide Moderate sedation 139 minutes Contrast 152ml Indications: Patient is an 81 year old female with Rutherford 5 critical limb ischemia.  She has a 61-month history of nonhealing ulcerations on the lateral aspect of her first metatarsal head.  ABIs demonstrated severe peripheral arterial disease.  On physical exam she had nonpalpable pulses in the left foot.  After discussing the risk and benefits of left lower extremity angiography in an effort to improve distal perfusion, Omaria elected to proceed. Findings: Aortogram: Bilateral renal arteries patent, moderate calcific disease of the infrarenal abdominal aorta.  No flow-limiting stenosis appreciated in the iliac arteries bilaterally Of the left: Normal common femoral artery, normal profunda, superficial femoral artery with multiple areas of flow-limiting stenosis, ranging from 50% to 95% in 2 locations.  Popliteal  artery without stenosis.  Anterior tibial artery with 3 cm occlusion at its ostia.  This filled through collaterals and remained patent into the foot.  Luminal size was small - 1.5-2 mm.  The peroneal artery was atretic but patent to the level of the ankle giving off a perforator that filled collaterals in the foot. Procedure:  The patient was identified in the holding area and taken to room 8.  The patient was then placed supine on the table and prepped and draped in the usual sterile fashion.  A time out was called.  Ultrasound was used to evaluate the right common femoral artery.  It was patent .  A digital ultrasound image was acquired.  A micropuncture needle was used to access the right common femoral artery under ultrasound  guidance.  An 018 wire was advanced without resistance and a micropuncture sheath was placed.  The 018 wire was removed and a benson wire was placed.  The micropuncture sheath was exchanged for a 5 french sheath.  An omniflush catheter was advanced over the wire to the level of L-1.  An abdominal angiogram was obtained.  Next, using the omniflush catheter and a benson wire, the aortic bifurcation was crossed and the catheter was placed into theleft external iliac artery and left runoff was obtained.  See findings above. Being that the peroneal artery was atretic and filling of the foot was limited, I made the decision to intervene on both the superficial femoral artery and anterior tibial artery.  A 6 x 45cm sheath was brought to the field and positioned in the left common femoral artery.  From this position, the superficial femoral artery was traversed using an 035 system.  True lumen was confirmed, and the area was balloon angioplastied using a 4 x 150 mm drug-coated balloon.  This demonstrated resolution of the areas of stenosis.  Next, using a series of 018 and 014 wires and catheters I was able to enter the ostia of the anterior tibial artery.  I was not however able to traverse the  3cm densely calcified lesion.  I made the decision to attempt retrograde access.  The ultrasound was re-prepped and distal anterior tibial artery accessed using ultrasound.  I was able to traverse the anterior tibial artery to the level of the 3 cm occlusion from retrograde approach, but was unable to cross the lesion. Completion angiography demonstrated resolution of flow-limiting stenosis within the superficial femoral artery.  There was no anterior tibial artery runoff, but this was due to the sheath present in the distal anterior tibial artery that was occlusive.  The peroneal artery ran to the ankle with medial lateral perforators entering the foot. Impression: Successful drug-coated balloon angioplasty of the superficial femoral artery. Unsuccessful recannulization of the anterior tibial artery. Cassandria Santee, MD [facility] Office: [phone]    DG Chest Port 1 View  Result Date: 03/17/2021 CLINICAL DATA:  Shortness of breath EXAM: PORTABLE CHEST 1 VIEW COMPARISON:  03/04/2021 FINDINGS: Cardiomegaly. Both lungs are clear. The visualized skeletal structures are unremarkable. IMPRESSION: Cardiomegaly without acute abnormality of the lungs in AP portable projection. Electronically Signed   By: Delanna Ahmadi M.D.   On: 03/17/2021 16:48   DG Foot 2 Views Left  Result Date: 04/08/2021 CLINICAL DATA:  Postop left first digit amputation. EXAM: LEFT FOOT - 2 VIEW COMPARISON:  04/04/2021 FINDINGS: Interval amputation of the great toe to the level of the proximal shaft of the first metatarsal. There is a sharp postoperative amputation site. Old healed distal fifth metatarsal fracture unchanged. No acute fracture or dislocation. Mild posterior tibiotalar joint space narrowing. Vascular calcifications are noted. IMPRESSION: Interval amputation of the great toe at the level of the proximal shaft of the first metatarsal. Electronically Signed   By: Yvonne Kendall M.D.   On: 04/08/2021  15:06   DG Foot Complete Left  Result Date: 04/12/2021 Please see detailed radiograph report in office note.  VAS [place] ABI WITH/WO TBI  Result Date: 04/05/2021  LOWER EXTREMITY DOPPLER STUDY Patient Name:  Jody [name]  Date of Exam:   04/05/2021 Medical Rec #: [id number]        Accession #:    [id number] Date of Birth: 05/17/1940        Patient Gender: F  Patient Age:   46 years Exam Location:  [facility] Procedure:      VAS [place] ABI WITH/WO TBI Referring Phys: JOSHUA GEIPLE --------------------------------------------------------------------------------  Indications: Ulceration. High Risk Factors: Hypertension, hyperlipidemia, Diabetes, past history of                    smoking, prior MI, prior CVA. Other Factors: Afib, CHF, ESRD(HD), TIA.  Comparison Study: Previous exam on 04/19/2018 RT 0.83 & LT 0.79 Performing Technologist: Rogelia Rohrer RVT, RDMS  Examination Guidelines: A complete evaluation includes at minimum, Doppler waveform signals and systolic blood pressure reading at the level of bilateral brachial, anterior tibial, and posterior tibial arteries, when vessel segments are accessible. Bilateral testing is considered an integral part of a complete examination. Photoelectric Plethysmograph (PPG) waveforms and toe systolic pressure readings are included as required and additional duplex testing as needed. Limited examinations for reoccurring indications may be performed as noted.  ABI Findings: +---------+------------------+-----+----------+--------+  Right     Rt Pressure (mmHg) Index Waveform   Comment   +---------+------------------+-----+----------+--------+  Brachial                                      DIA       +---------+------------------+-----+----------+--------+  PTA       190                1.74  monophasic           +---------+------------------+-----+----------+--------+  DP                                 monophasic [place]        +---------+------------------+-----+----------+--------+   Great Toe 55                 0.50  Abnormal             +---------+------------------+-----+----------+--------+ +---------+------------------+-----+-------------------+-------+  Left      Lt Pressure (mmHg) Index Waveform            Comment  +---------+------------------+-----+-------------------+-------+  Brachial  109                      biphasic                     +---------+------------------+-----+-------------------+-------+  PTA                                dampened monophasic [place]       +---------+------------------+-----+-------------------+-------+  DP                                 monophasic          [place]       +---------+------------------+-----+-------------------+-------+  Great Toe 27                 0.25  Abnormal                     +---------+------------------+-----+-------------------+-------+ +-------+-----------+-----------+------------+------------+  ABI/TBI Today's ABI Today's TBI Previous ABI Previous TBI  +-------+-----------+-----------+------------+------------+  Right   [place]          0.50        0.83  0.70          +-------+-----------+-----------+------------+------------+  Left    [place]          0.25        0.79         0.31          +-------+-----------+-----------+------------+------------+ Arterial wall calcification precludes accurate ankle pressures and ABIs.  Summary: Right: Resting right ankle-brachial index indicates noncompressible right lower extremity arteries. The right toe-brachial index is abnormal (moderate). Left: Resting left ankle-brachial index indicates noncompressible left lower extremity arteries. The left toe-brachial index is abnormal (severe).  *See table(s) above for measurements and observations.  Electronically signed by Servando Snare MD on 04/05/2021 at 3:19:58 PM.    Final    VAS [place] LOWER EXTREMITY VENOUS (DVT)  Result Date: 04/11/2021  Lower Venous DVT Study Patient Name:  Jody [name]  Date of Exam:   04/10/2021 Medical Rec #: [id number]         Accession #:    [id number] Date of Birth: Jun 26, 1940        Patient Gender: F Patient Age:   43 years Exam Location:  [facility] Procedure:      VAS [place] LOWER EXTREMITY VENOUS (DVT) Referring Phys: Celesta Gentile --------------------------------------------------------------------------------  Indications: Edema, and Pain.  Risk Factors: Status post left great toe amputation 04/08/2021. Comparison Study: No prior study Performing Technologist: Sharion Dove RVS  Examination Guidelines: A complete evaluation includes B-mode imaging, spectral Doppler, color Doppler, and power Doppler as needed of all accessible portions of each vessel. Bilateral testing is considered an integral part of a complete examination. Limited examinations for reoccurring indications may be performed as noted. The reflux portion of the exam is performed with the patient in reverse Trendelenburg.  +-----+---------------+---------+-----------+------------------+--------------+  RIGHT Compressibility Phasicity Spontaneity Properties         Thrombus Aging  +-----+---------------+---------+-----------+------------------+--------------+  CFV   Full                                  pulsatile waveform                 +-----+---------------+---------+-----------+------------------+--------------+   +---------+---------------+---------+-----------+---------------+--------------+  LEFT      Compressibility Phasicity Spontaneity Properties      Thrombus Aging  +---------+---------------+---------+-----------+---------------+--------------+  CFV       Full                                  pulsatile                                                                        waveform                        +---------+---------------+---------+-----------+---------------+--------------+  SFJ       Full                                                                  +---------+---------------+---------+-----------+---------------+--------------+  FV Prox    Full                                                                  +---------+---------------+---------+-----------+---------------+--------------+  FV Mid    Full                                                                  +---------+---------------+---------+-----------+---------------+--------------+  FV Distal Full                                                                  +---------+---------------+---------+-----------+---------------+--------------+  PFV       Full                                                                  +---------+---------------+---------+-----------+---------------+--------------+  POP       Full                                  pulsatile                                                                        waveform                        +---------+---------------+---------+-----------+---------------+--------------+  PTV       Full                                                                  +---------+---------------+---------+-----------+---------------+--------------+  PERO                                                            Not well  visualized      +---------+---------------+---------+-----------+---------------+--------------+     Summary: RIGHT: - No evidence of common femoral vein obstruction.  LEFT: - There is no evidence of deep vein thrombosis in the lower extremity. However, portions of this examination were limited- see technologist comments above.  - Ultrasound characteristics of enlarged lymph nodes noted in the groin. Diffuse interstitial edema noted throughout the left lower extremity. Waveforms are pulsatile suggestive of fluid overload.  *See table(s) above for measurements and observations. Electronically signed by Servando Snare MD on 04/11/2021 at 2:39:07 PM.    Final     Microbiology: Results for orders placed or performed during the hospital encounter of  04/04/21  Resp Panel by RT-PCR (Flu A&B, Covid) Nasopharyngeal Swab     Status: None   Collection Time: 04/04/21  1:20 PM   Specimen: Nasopharyngeal Swab; Nasopharyngeal(NP) swabs in vial transport medium  Result Value Ref Range Status   SARS Coronavirus 2 by RT PCR NEGATIVE NEGATIVE Final    Comment: (NOTE) SARS-CoV-2 target nucleic acids are NOT DETECTED.  The SARS-CoV-2 RNA is generally detectable in upper respiratory specimens during the acute phase of infection. The lowest concentration of SARS-CoV-2 viral copies this assay can detect is 138 copies/mL. A negative result does not preclude SARS-Cov-2 infection and should not be used as the sole basis for treatment or other patient management decisions. A negative result may occur with  improper specimen collection/handling, submission of specimen other than nasopharyngeal swab, presence of viral mutation(s) within the areas targeted by this assay, and inadequate number of viral copies(<138 copies/mL). A negative result must be combined with clinical observations, patient history, and epidemiological information. The expected result is Negative.  Fact Sheet for Patients:  EntrepreneurPulse.com.au  Fact Sheet for Healthcare Providers:  IncredibleEmployment.be  This test is no t yet approved or cleared by the [place] FDA and  has been authorized for detection and/or diagnosis of SARS-CoV-2 by FDA under an Emergency Use Authorization (EUA). This EUA will remain  in effect (meaning this test can be used) for the duration of the COVID-19 declaration under Section 564(b)(1) of the Act, 21 U.S.C.section 360bbb-3(b)(1), unless the authorization is terminated  or revoked sooner.       Influenza A by PCR NEGATIVE NEGATIVE Final   Influenza B by PCR NEGATIVE NEGATIVE Final    Comment: (NOTE) The Xpert Xpress SARS-CoV-2/FLU/RSV plus assay is intended as an aid in the diagnosis of influenza from  Nasopharyngeal swab specimens and should not be used as a sole basis for treatment. Nasal washings and aspirates are unacceptable for Xpert Xpress SARS-CoV-2/FLU/RSV testing.  Fact Sheet for Patients: EntrepreneurPulse.com.au  Fact Sheet for Healthcare Providers: IncredibleEmployment.be  This test is not yet approved or cleared by the [place] FDA and has been authorized for detection and/or diagnosis of SARS-CoV-2 by FDA under an Emergency Use Authorization (EUA). This EUA will remain in effect (meaning this test can be used) for the duration of the COVID-19 declaration under Section 564(b)(1) of the Act, 21 U.S.C. section 360bbb-3(b)(1), unless the authorization is terminated or revoked.  Performed at [facility], [place] [address], [place], [place] [address]   Blood culture (routine x 2)     Status: None   Collection Time: 04/04/21  4:40 PM   Specimen: BLOOD  Result Value Ref Range Status   Specimen Description BLOOD BLOOD RIGHT ARM  Final   Special Requests   Final    BOTTLES DRAWN AEROBIC ONLY Blood Culture adequate volume  Culture   Final    NO GROWTH 5 DAYS Performed at [facility], [place] [address], [place], [place] [address]    Report Status 04/09/2021 FINAL  Final  Blood culture (routine x 2)     Status: None   Collection Time: 04/05/21  3:59 AM   Specimen: BLOOD  Result Value Ref Range Status   Specimen Description BLOOD LEFT ANTECUBITAL  Final   Special Requests   Final    BOTTLES DRAWN AEROBIC AND ANAEROBIC Blood Culture results may not be optimal due to an excessive volume of blood received in culture bottles   Culture   Final    NO GROWTH 5 DAYS Performed at [facility], [place] [address] [place], [place] [address]    Report Status 04/10/2021 FINAL  Final  Resp Panel by RT-PCR (Flu A&B, Covid) Nasopharyngeal Swab     Status: None   Collection Time: 04/11/21 11:22 AM   Specimen: Nasopharyngeal Swab;  Nasopharyngeal(NP) swabs in vial transport medium  Result Value Ref Range Status   SARS Coronavirus 2 by RT PCR NEGATIVE NEGATIVE Final    Comment: (NOTE) SARS-CoV-2 target nucleic acids are NOT DETECTED.  The SARS-CoV-2 RNA is generally detectable in upper respiratory specimens during the acute phase of infection. The lowest concentration of SARS-CoV-2 viral copies this assay can detect is 138 copies/mL. A negative result does not preclude SARS-Cov-2 infection and should not be used as the sole basis for treatment or other patient management decisions. A negative result may occur with  improper specimen collection/handling, submission of specimen other than nasopharyngeal swab, presence of viral mutation(s) within the areas targeted by this assay, and inadequate number of viral copies(<138 copies/mL). A negative result must be combined with clinical observations, patient history, and epidemiological information. The expected result is Negative.  Fact Sheet for Patients:  EntrepreneurPulse.com.au  Fact Sheet for Healthcare Providers:  IncredibleEmployment.be  This test is no t yet approved or cleared by the [place] FDA and  has been authorized for detection and/or diagnosis of SARS-CoV-2 by FDA under an Emergency Use Authorization (EUA). This EUA will remain  in effect (meaning this test can be used) for the duration of the COVID-19 declaration under Section 564(b)(1) of the Act, 21 U.S.C.section 360bbb-3(b)(1), unless the authorization is terminated  or revoked sooner.       Influenza A by PCR NEGATIVE NEGATIVE Final   Influenza B by PCR NEGATIVE NEGATIVE Final    Comment: (NOTE) The Xpert Xpress SARS-CoV-2/FLU/RSV plus assay is intended as an aid in the diagnosis of influenza from Nasopharyngeal swab specimens and should not be used as a sole basis for treatment. Nasal washings and aspirates are unacceptable for Xpert Xpress  SARS-CoV-2/FLU/RSV testing.  Fact Sheet for Patients: EntrepreneurPulse.com.au  Fact Sheet for Healthcare Providers: IncredibleEmployment.be  This test is not yet approved or cleared by the [place] FDA and has been authorized for detection and/or diagnosis of SARS-CoV-2 by FDA under an Emergency Use Authorization (EUA). This EUA will remain in effect (meaning this test can be used) for the duration of the COVID-19 declaration under Section 564(b)(1) of the Act, 21 U.S.C. section 360bbb-3(b)(1), unless the authorization is terminated or revoked.  Performed at [facility], [place] [address], [place], [place] [address]   SARS CORONAVIRUS 2 (TAT 6-24 HRS) Nasopharyngeal Nasopharyngeal Swab     Status: None   Collection Time: 04/14/21  5:08 PM   Specimen: Nasopharyngeal Swab  Result Value Ref Range  Status   SARS Coronavirus 2 NEGATIVE NEGATIVE Final    Comment: (NOTE) SARS-CoV-2 target nucleic acids are NOT DETECTED.  The SARS-CoV-2 RNA is generally detectable in upper and lower respiratory specimens during the acute phase of infection. Negative results do not preclude SARS-CoV-2 infection, do not rule out co-infections with other pathogens, and should not be used as the sole basis for treatment or other patient management decisions. Negative results must be combined with clinical observations, patient history, and epidemiological information. The expected result is Negative.  Fact Sheet for Patients: SugarRoll.be  Fact Sheet for Healthcare Providers: https://www.woods-mathews.com/  This test is not yet approved or cleared by the [place] FDA and  has been authorized for detection and/or diagnosis of SARS-CoV-2 by FDA under an Emergency Use Authorization (EUA). This EUA will remain  in effect (meaning this test can be used) for the duration of the COVID-19 declaration under Se ction  564(b)(1) of the Act, 21 U.S.C. section 360bbb-3(b)(1), unless the authorization is terminated or revoked sooner.  Performed at [facility], [place] [address], [place], [place] [address]   MRSA Next Gen by PCR, Nasal     Status: None   Collection Time: 04/14/21 11:18 PM   Specimen: Nasal Mucosa; Nasal Swab  Result Value Ref Range Status   MRSA by PCR Next Gen NOT DETECTED NOT DETECTED Final    Comment: (NOTE) The GeneXpert MRSA Assay (FDA approved for NASAL specimens only), is one component of a comprehensive MRSA colonization surveillance program. It is not intended to diagnose MRSA infection nor to guide or monitor treatment for MRSA infections. Test performance is not FDA approved in patients less than 61 years old. Performed at [facility], [place] [address], [place], [place] [address]     Labs: CBC: Recent Labs  Lab 04/09/21 [phone] 04/11/21 0232 04/14/21 0725  WBC 6.0 7.2 4.7  HGB 7.5* 8.2* 7.4*  HCT 24.0* 25.6* 25.3*  MCV 111.1* 109.9* 115.0*  PLT 112* 132* 643   Basic Metabolic Panel: Recent Labs  Lab 04/09/21 0712 04/11/21 0232 04/14/21 0726  NA 133* 133* 135  K 4.1 4.9 4.0  CL 97* 98 96*  CO2 26 24 28   GLUCOSE 95 104* 95  BUN 28* 25* 23  CREATININE 4.21* 4.11* 4.26*  CALCIUM 9.5 9.7 9.3  PHOS 4.3  --  3.6   Liver Function Tests: Recent Labs  Lab 04/09/21 0712 04/14/21 0726  ALBUMIN 2.5* 2.4*   CBG: Recent Labs  Lab 04/14/21 0635 04/14/21 1051 04/14/21 1630 04/14/21 2201 04/15/21 0640  GLUCAP 158* 115* 134* 135* 96    Discharge time spent: greater than 30 minutes.  Signed: Nolberto Hanlon, MD [facility] 04/15/2021

## 2021-04-13 NOTE — Progress Notes (Signed)
Subjective: ?POD # 5 s/p left partial first ray amputation.  States has been having pain to her foot into her leg since the surgery.  Denies any fevers or chills. ? ?Objective: ?AAO x3, NAD ?Dressing clean, dry, intact without any strikethrough.  Dressing was changed today.  Sutures intact with any dehiscence.  Mild edema.  No drainage or pus noted.  No significant cellulitis at this time.  ?No pain with calf compression, swelling, warmth, erythema ? ? ? ? ? ? ?Assessment: ?POD # 5 s/p left partial first amputation ? ?Plan: ?-All questions and concerns were discussed with the patient in extensive detail ?-Patient is okay to be discharged from our standpoint. ?-She will not need dressing change until follow-up ?-She can follow-up with Dr. Jacqualyn Posey in 1 week from discharge. ?-Antibiotics per infectious disease.  She will continue on cefepime with dialysis. ?-DVT ultrasound is negative. ?-I will see her back again on Friday if she still here. ? ?Boneta Lucks D.P.M. ?

## 2021-04-13 NOTE — Progress Notes (Signed)
Occupational Therapy Treatment Patient Details [name] MRN: [id number] DOB: November 21, 1940 Today's Date: 04/13/2021   History of present illness 81 y.o. female presents to [facility] hospital on 04/04/2021 with concerns for osteomyelitis of L foot. S/p angiogram with SFA drug coated balloon angioplasty and unsuccessful recannulization of the ATA 2/22. S/p L first ray amp on 2/24. Ultrasound taken of lower extremities 2/26 negative for DVT.  PMH includes ESRD, DMII, Afib, COPD, CHF, HTN, CVA.   OT comments  Pt progressed from bed level to chair with hoyer pad placed for easier transfer with RN staff back to bed. Pt anxious once eob and fear of falling. Pt with increased confidence using the stedy but does not (A) to place RUE on handle due to decreased AROM shoulder flexion. Recommendation for SNF    Recommendations for follow up therapy are one component of a multi-disciplinary discharge planning process, led by the attending physician.  Recommendations may be updated based on patient status, additional functional criteria and insurance authorization.    Follow Up Recommendations  Skilled nursing-short term rehab (<3 hours/day)    Assistance Recommended at Discharge Frequent or constant Supervision/Assistance  Patient can return home with the following  Two people to help with walking and/or transfers;Two people to help with bathing/dressing/bathroom   Equipment Recommendations  Other (comment)    Recommendations for Other Services PT consult    Precautions / Restrictions Precautions Precautions: Fall;Other (comment) Precaution Comments: close vitals monitor Restrictions Weight Bearing Restrictions: No       Mobility Bed Mobility Overal bed mobility: Needs Assistance Bed Mobility: Rolling, Sidelying to Sit, Sit to Supine Rolling: Mod assist Sidelying to sit: Max assist, HOB elevated, +2 for physical assistance       General bed mobility comments: pt heavily reliant on use of  bed rails and features, needs cues for sequencing and technique. Max A for anterior hip scooting to place foot flat on floor and maxA for trunk static sitting due to posterior lean    Transfers Overall transfer level: Needs assistance Equipment used: 2 person hand held assist, Ambulation equipment used Transfers: Sit to/from Stand, Bed to chair/wheelchair/BSC Sit to Stand: Max assist, +2 physical assistance   Squat pivot transfers: Total assist, +2 physical assistance       General transfer comment: Pt able to raise hips ~3" from recliner with +2 maxA to [place], lift pad placed under her at end of session to ensure safe return to bed later in day when nursing staff assists her. Pt able to initiate squat pivot transfer with +2 HHA and BLE blocked but needs total A +2 to complete due to weakness/poor technique, staff assist with bed pad under her hips to ensure safety. Pt required chair arm removed to clear surface. pt benefits from hoyer lift for safety at this time with RN staff     Balance Overall balance assessment: Needs assistance Sitting-balance support: No upper extremity supported, Feet supported Sitting balance-Leahy Scale: Fair Sitting balance - Comments: Static sitting and weight shifting EOB with mostly min guard to minA trunk support Postural control: Posterior lean Standing balance support: Bilateral upper extremity supported Standing balance-Leahy Scale: Zero Standing balance comment: totalA +2 for partial standing, unable to reach full upright posture                           ADL either performed or assessed with clinical judgement   ADL Overall ADL's : Needs assistance/impaired Eating/Feeding: Moderate assistance;Bed  level Eating/Feeding Details (indicate cue type and reason): dropping objects attempting to eat Grooming: Minimal assistance;Sitting                                 General ADL Comments: oob to chair this session which simulated  bathroom drop arm BSC transfer.    Extremity/Trunk Assessment Upper Extremity Assessment Upper Extremity Assessment: Generalized weakness RUE Deficits / Details: decreased shoulder flexion. pt hand over hand to complete shoulder flexion to 90 degrees but unable to sustain without hand holding stedy. Pt with abduction and elbow flexion to reach for objects due to R shoulder flexion deficits RUE Coordination: decreased fine motor;decreased gross motor LUE Coordination: decreased fine motor;decreased gross motor            Vision       Perception     Praxis      Cognition Arousal/Alertness: Awake/alert Behavior During Therapy: Restless, Anxious Overall Cognitive Status: No family/caregiver present to determine baseline cognitive functioning                                 General Comments: pt able to correctly report where tape is in the room and correctly report to locate a straw needed. Pt however reports that her dresser and bedroom suit is suppose to be here somewhere and does not understand why she can't see it. She is perseverating on making calls to family initially. pt reports 81 yo and January. pt then reports 2/23 then 2/24/ then 2/25. STaff educating the patient to date 04/13/21. pt seems shocked by this and reports having a birthday on [birth year]. Pt thinks she is at [facility] and when formed she is at [facility] states "i am not crazy i know i am at hospital"        Exercises Exercises: Other exercises Other Exercises Other Exercises: BLE AROM: LAQ x5 reps    Shoulder Instructions       General Comments 02 cord taped with poor wave form. RA desat 88% so placed back on Ambia 3L with 100% reading    Pertinent Vitals/ Pain       Pain Assessment Pain Assessment: Faces Faces Pain Scale: Hurts little more Pain Location: LLE pain with any tactile input to the leg. pt report some low back discomfort Pain Descriptors / Indicators: Discomfort, Grimacing, Guarding Pain  Intervention(s): Monitored during session, Premedicated before session, Repositioned  Home Living                                          Prior Functioning/Environment              Frequency  Min 2X/week        Progress Toward Goals  OT Goals(current goals can now be found in the care plan section)  Progress towards OT goals: Progressing toward goals  Acute Rehab OT Goals Patient Stated Goal: to call my family- pt assisted with telephone talking to PAm OT Goal Formulation: With patient Time For Goal Achievement: 04/24/21 Potential to Achieve Goals: Fair ADL Goals Pt Will Perform Eating: with modified independence;sitting;with adaptive utensils Pt Will Perform Grooming: with modified independence;sitting;with adaptive equipment Pt Will Perform Upper Body Bathing: with mod assist;sitting Pt Will Transfer to Toilet: with mod assist;with +2 assist;stand  pivot transfer;bedside commode  Plan Discharge plan remains appropriate    Co-evaluation    PT/OT/SLP Co-Evaluation/Treatment: Yes Reason for Co-Treatment: Complexity of the patient's impairments (multi-system involvement);Necessary to address cognition/behavior during functional activity;For patient/therapist safety;To address functional/ADL transfers PT goals addressed during session: Mobility/safety with mobility;Balance;Strengthening/ROM OT goals addressed during session: ADL's and self-care;Proper use of Adaptive equipment and DME;Strengthening/ROM      AM-PAC OT "6 Clicks" Daily Activity     Outcome Measure   Help from another person eating meals?: A Little Help from another person taking care of personal grooming?: A Little Help from another person toileting, which includes using toliet, bedpan, or urinal?: Total Help from another person bathing (including washing, rinsing, drying)?: A Lot Help from another person to put on and taking off regular upper body clothing?: A Lot Help from another  person to put on and taking off regular lower body clothing?: Total 6 Click Score: 12    End of Session Equipment Utilized During Treatment: Gait belt  OT Visit Diagnosis: Unsteadiness on feet (R26.81);Other abnormalities of gait and mobility (R26.89);Muscle weakness (generalized) (M62.81)   Activity Tolerance Patient limited by fatigue   Patient Left in chair;with call bell/phone within reach;with chair alarm set   Nurse Communication Mobility status        Time: 4917-9150 OT Time Calculation (min): 28 min  Charges: OT General Charges $OT Visit: 1 Visit OT Treatments $Self Care/Home Management : 8-22 mins   Brynn, OTR/L  Acute Rehabilitation Services Pager: [phone] Office: [phone] .   Jeri Modena 04/13/2021, 11:38 AM

## 2021-04-13 NOTE — Progress Notes (Signed)
?[facility] ?Progress Note  ? ?Subjective:  HD yesterday with no issues.  Awaiting d/c to SNF.     ? ? ?Objective ?Vitals:  ? 04/13/21 0500 04/13/21 0756 04/13/21 1100 04/13/21 1117  ?BP:  (!) 106/57  111/77  ?Pulse:  79  83  ?Resp:  14  18  ?Temp:  98.6 ?F (37 ?C)  98.2 ?F (36.8 ?C)  ?TempSrc:  Oral  Oral  ?SpO2:  100% 100% 97%  ?Weight: 86.1 kg     ?Height:      ? ?Physical Exam ?General: Elderly woman, NAD, sleeping but arousable ?Heart: Irreg irregular, no murmur ?Lungs: CTA anteriorly ?Abdomen: soft ?Extremities: No LE edema; L foot wrapped ?Dialysis Access: RUE AVF + bruit ? ?Additional Objective ?Labs: ?Basic Metabolic Panel: ?Recent Labs  ?Lab 04/08/21 ?0714 04/09/21 ?7858 04/11/21 ?0232  ?NA 132* 133* 133*  ?K 3.7 4.1 4.9  ?CL 97* 97* 98  ?CO2 24 26 24   ?GLUCOSE 108* 95 104*  ?BUN 20 28* 25*  ?CREATININE 3.32* 4.21* 4.11*  ?CALCIUM 9.3 9.5 9.7  ?PHOS  --  4.3  --   ? ?Liver Function Tests: ?Recent Labs  ?Lab 04/09/21 ?0712  ?ALBUMIN 2.5*  ? ?CBC: ?Recent Labs  ?Lab 04/07/21 ?0222 04/08/21 ?8502 04/09/21 ?7741 04/11/21 ?0232  ?WBC 6.2 7.1 6.0 7.2  ?HGB 7.7* 8.1* 7.5* 8.2*  ?HCT 24.5* 26.5* 24.0* 25.6*  ?MCV 108.9* 108.6* 111.1* 109.9*  ?PLT 106* 120* 112* 132*  ? ?Blood Culture ?   ?Component Value Date/Time  ? SDES BLOOD LEFT ANTECUBITAL 04/05/2021 0359  ? SPECREQUEST  04/05/2021 0359  ?  BOTTLES DRAWN AEROBIC AND ANAEROBIC Blood Culture results may not be optimal due to an excessive volume of blood received in culture bottles  ? CULT  04/05/2021 0359  ?  NO GROWTH 5 DAYS ?Performed at [facility], [place] [address] [place], [place] [address] ?  ? REPTSTATUS 04/10/2021 FINAL 04/05/2021 0359  ? ?Studies/Results: ?CT HEAD WO CONTRAST (5MM) ? ?Result Date: 04/12/2021 ?CLINICAL DATA:  81 year old female with altered mental status. EXAM: CT HEAD WITHOUT CONTRAST TECHNIQUE: Contiguous axial images were obtained from the base of the skull through the vertex without intravenous contrast.  RADIATION DOSE REDUCTION: This exam was performed according to the departmental dose-optimization program which includes automated exposure control, adjustment of the mA and/or kV according to patient size and/or use of iterative reconstruction technique. COMPARISON:  Brain MRI 03/07/2021.  Head CT 03/03/2021. FINDINGS: Brain: No midline shift, mass effect, or evidence of intracranial mass lesion. No ventriculomegaly. Confluent bilateral cerebral white matter hypodensity with superimposed right parietal lobe cortical and subcortical encephalomalacia, stable from last month. No acute intracranial hemorrhage identified. No cortically based acute infarct identified. Vascular: Calcified atherosclerosis at the skull base. Skull: Motion artifact at the skull base. Stable visualized osseous structures. Congenital incomplete ossification of the posterior C1 ring. Sinuses/Orbits: Visualized paranasal sinuses and mastoids are stable and well aerated. Other: No acute orbit or scalp soft tissue finding. Calcified scalp vessel atherosclerosis. IMPRESSION: 1. No acute intracranial abnormality. 2. Chronic white matter disease and right parietal lobe encephalomalacia since last month. Electronically Signed   By: Genevie Ann M.D.   On: 04/12/2021 11:02  ? ?DG Foot Complete Left ? ?Result Date: 04/12/2021 ?Please see detailed radiograph report in office note.  ?Medications: ? sodium chloride    ? ceFEPime (MAXIPIME) IV 200 mL/hr at 04/12/21 1843  ? ? (feeding supplement) PROSource Plus  30 mL Oral BID BM  ?  acetaminophen  500 mg Oral TID  ? apixaban  2.5 mg Oral BID  ? aspirin EC  81 mg Oral Daily  ? atorvastatin  40 mg Oral Daily  ? Chlorhexidine Gluconate Cloth  6 each Topical Daily  ? clopidogrel  75 mg Oral Q breakfast  ? darbepoetin (ARANESP) injection - DIALYSIS  200 mcg Intravenous Q Thu-HD  ? ferrous sulfate  325 mg Oral BID WC  ? gabapentin  100 mg Oral QHS  ? insulin aspart  0-6 Units Subcutaneous TID WC  ? metoprolol tartrate   12.5 mg Oral BID  ? pramipexole  0.125 mg Oral Daily  ? sevelamer carbonate  800 mg Oral TID WC  ? sodium chloride flush  3 mL Intravenous Q12H  ? vitamin B-12  100 mcg Oral Daily  ? ? ?OP HD: TTS [facility] on 05/11/20. ?3h 69min   79.5kg     2K/2.5Ca bath  RUE AVF  15g   Hep none ?- Hep B SAg neg 2/20, COVID neg 2/20 ?- Calcitriol 1.66mcg PO q HD ?- Mircera 222mcg IV q 2 weeks ?- Venofer 50mg  IV weekly ?- On Vanc 1g and Cefepime 2g since 2/9. Wound Cx at OP clinic: Finegoldia magna ?  ?Assessment/Plan: ? L foot osteomyelitis: IV abx broad-spec since admit on 2/20 (van'c cefepime/ flagyl) Afebrile. BCxs negative. SP L great toe amp on 2/24. Stop date 05/10/21- antibiotics (cefepime) can be given at [facility]. ?PAD: ABIs abnormal, s/p angioplasty to L SFA by [facility] on 2/22. ? ESRD: on HD TTS. Next HD 04/14/21  ? Hypertension/volume: overall soft BP. Diltiazem stopped, getting metoprolol bid. Wt's appear inaccurate, up 7kg but not vol overloaded by exam and BP's soft.  ? Anemia: Hgb 7.7, s/p 1U PRBCs on 2/22. No IV iron d/t infection.  SP darbe 200 ug on 2/23. Ordered weekly.  ? Metabolic bone disease: Ca 10.2 on admit, VDRA on hold. Continue Renvela as binder. ? Nutrition:  Alb low (3.1) - continue supplements. ?A-fib: On metoprolol 12.5 bid, po diltiazem and eliquis dc'd ?Memory loss - confused, prob delirium, seems better today ?COPD ?Hx CVA ?Dispo: ok to d/c from renal perspective.   ? ?Madelon Lips, MD ?04/13/2021, 12:40 PM ? ? ? ? ? ? ? ?

## 2021-04-14 LAB — CBC
HCT: 25.3 % — ABNORMAL LOW (ref 36.0–46.0)
Hemoglobin: 7.4 g/dL — ABNORMAL LOW (ref 12.0–15.0)
MCH: 33.6 pg (ref 26.0–34.0)
MCHC: 29.2 g/dL — ABNORMAL LOW (ref 30.0–36.0)
MCV: 115 fL — ABNORMAL HIGH (ref 80.0–100.0)
Platelets: 159 10*3/uL (ref 150–400)
RBC: 2.2 MIL/uL — ABNORMAL LOW (ref 3.87–5.11)
RDW: 20.3 % — ABNORMAL HIGH (ref 11.5–15.5)
WBC: 4.7 10*3/uL (ref 4.0–10.5)
nRBC: 0 % (ref 0.0–0.2)

## 2021-04-14 LAB — RENAL FUNCTION PANEL
Albumin: 2.4 g/dL — ABNORMAL LOW (ref 3.5–5.0)
Anion gap: 11 (ref 5–15)
BUN: 23 mg/dL (ref 8–23)
CO2: 28 mmol/L (ref 22–32)
Calcium: 9.3 mg/dL (ref 8.9–10.3)
Chloride: 96 mmol/L — ABNORMAL LOW (ref 98–111)
Creatinine, Ser: 4.26 mg/dL — ABNORMAL HIGH (ref 0.44–1.00)
GFR, Estimated: 10 mL/min — ABNORMAL LOW (ref >60)
Glucose, Bld: 95 mg/dL (ref 70–99)
Phosphorus: 3.6 mg/dL (ref 2.5–4.6)
Potassium: 4 mmol/L (ref 3.5–5.1)
Sodium: 135 mmol/L (ref 135–145)

## 2021-04-14 LAB — GLUCOSE, CAPILLARY
Glucose-Capillary: 158 mg/dL — ABNORMAL HIGH (ref 70–99)
Glucose-Capillary: 115 mg/dL — ABNORMAL HIGH (ref 70–99)
Glucose-Capillary: 134 mg/dL — ABNORMAL HIGH (ref 70–99)
Glucose-Capillary: 135 mg/dL — ABNORMAL HIGH (ref 70–99)

## 2021-04-14 MED ORDER — GUAIFENESIN-DM 100-10 MG/5ML PO SYRP
15.0000 mL | ORAL_SOLUTION | ORAL | Status: DC | PRN
  Administered 2021-04-14: 15 mL via ORAL
  Filled 2021-04-14: qty 15

## 2021-04-14 NOTE — Progress Notes (Signed)
Occupational Therapy Treatment ?Patient Details ?[name] ?MRN: [id number] ?DOB: 22-Sep-1940 ?Today's Date: 04/14/2021 ? ? ?History of present illness 81 y.o. female presents to [facility] on [facility]/04/2021 with concerns for osteomyelitis of L foot. S/p angiogram with SFA drug coated balloon angioplasty and unsuccessful recannulization of the ATA 2/22. S/p L first ray amp on 2/24. Ultrasound taken of lower extremities 2/26 negative for DVT.  PMH includes ESRD, DMII, Afib, COPD, CHF, HTN, CVA. ?  ?OT comments ? Patient received in supine and was upset she did not get a cheeseburger for lunch. Patient was given sherbet and she was thankful. Patient was able to feed self with regular utensil at bed level.  Acute OT to continue to follow.   ? ?Recommendations for follow up therapy are one component of a multi-disciplinary discharge planning process, led by the attending physician.  Recommendations may be updated based on patient status, additional functional criteria and insurance authorization. ?   ?Follow Up Recommendations ? Skilled nursing-short term rehab (<3 hours/day)  ?  ?Assistance Recommended at Discharge Frequent or constant Supervision/Assistance  ?Patient can return home with the following ? Two people to help with walking and/or transfers;Two people to help with bathing/dressing/bathroom ?  ?Equipment Recommendations ? Other (comment)  ?  ?Recommendations for Other Services   ? ?  ?Precautions / Restrictions Precautions ?Precautions: Fall;Other (comment) ?Precaution Comments: close vitals monitor ?Restrictions ?Weight Bearing Restrictions: No  ? ? ?  ? ?Mobility Bed Mobility ?Overal bed mobility: Needs Assistance ?  ?  ?  ?  ?  ?  ?General bed mobility comments: max assist to assist with positioning in bed for self feeding ?  ? ?Transfers ?  ?  ?  ?  ?  ?  ?  ?  ?  ?  ?  ?  ?Balance   ?  ?  ?  ?  ?  ?  ?  ?  ?  ?  ?  ?  ?  ?  ?  ?  ?  ?  ?   ? ?ADL either performed or assessed with clinical judgement   ? ?ADL Overall ADL's : Needs assistance/impaired ?Eating/Feeding: Supervision/ safety;Bed level ?Eating/Feeding Details (indicate cue type and reason): able to manage regular spoon to eat sherbet from container ?  ?  ?  ?  ?  ?  ?  ?  ?  ?  ?  ?  ?  ?  ?  ?  ?  ?General ADL Comments: Patient upset she did not get a cheeseburger for lunch. Provided patient with sherbet and she was able to feed self at bed level ?  ? ?Extremity/Trunk Assessment Upper Extremity Assessment ?RUE Deficits / Details: decreased shoulder flexion. pt hand over hand to complete shoulder flexion to 90 degrees but unable to sustain without hand holding stedy. Pt with abduction and elbow flexion to reach for objects due to R shoulder flexion deficits ?RUE Coordination: decreased fine motor;decreased gross motor ?LUE Deficits / Details: Decreased grasp strength. Also noting jerky movement and pt "dropping" her arms. ?LUE Coordination: decreased fine motor;decreased gross motor ?  ?  ?  ?  ?  ? ?Vision   ?  ?  ?Perception   ?  ?Praxis   ?  ? ?Cognition Arousal/Alertness: Awake/alert ?Behavior During Therapy: Restless, Anxious ?Overall Cognitive Status: No family/caregiver present to determine baseline cognitive functioning ?  ?  ?  ?  ?  ?  ?  ?  ?  ?  ?  ?  ?  ?  ?  ?  ?  General Comments: Patient able to use wall calendar for date.  Upset she couldn't ge a cheeseburger ?  ?  ?   ?Exercises   ? ?  ?Shoulder Instructions   ? ? ?  ?General Comments    ? ? ?Pertinent Vitals/ Pain       Pain Assessment ?Pain Assessment: No/denies pain ?Pain Intervention(s): Monitored during session ? ?Home Living   ?  ?  ?  ?  ?  ?  ?  ?  ?  ?  ?  ?  ?  ?  ?  ?  ?  ?  ? ?  ?Prior Functioning/Environment    ?  ?  ?  ?   ? ?Frequency ? Min 2X/week  ? ? ? ? ?  ?Progress Toward Goals ? ?OT Goals(current goals can now be found in the care plan section) ? Progress towards OT goals: Progressing toward goals ? ?Acute Rehab OT Goals ?Patient Stated Goal: go home ?OT Goal  Formulation: With patient ?Time For Goal Achievement: 04/24/21 ?Potential to Achieve Goals: Fair ?ADL Goals ?Pt Will Perform Eating: with modified independence;sitting;with adaptive utensils ?Pt Will Perform Grooming: with modified independence;sitting;with adaptive equipment ?Pt Will Perform Upper Body Bathing: with mod assist;sitting ?Pt Will Transfer to Toilet: with mod assist;with +2 assist;stand pivot transfer;bedside commode  ?Plan Discharge plan remains appropriate   ? ?Co-evaluation ? ? ?   ?  ?  ?  ?  ? ?  ?AM-PAC OT "6 Clicks" Daily Activity     ?Outcome Measure ? ? Help from another person eating meals?: A Little ?Help from another person taking care of personal grooming?: A Little ?Help from another person toileting, which includes using toliet, bedpan, or urinal?: Total ?Help from another person bathing (including washing, rinsing, drying)?: A Lot ?Help from another person to put on and taking off regular upper body clothing?: A Lot ?Help from another person to put on and taking off regular lower body clothing?: Total ?6 Click Score: 12 ? ?  ?End of Session   ? ?OT Visit Diagnosis: Unsteadiness on feet (R26.81);Other abnormalities of gait and mobility (R26.89);Muscle weakness (generalized) (M62.81) ?  ?Activity Tolerance Patient tolerated treatment well ?  ?Patient Left in bed;with call bell/phone within reach;with bed alarm set ?  ?Nurse Communication Mobility status ?  ? ?   ? ?Time: 6063-0160 ?OT Time Calculation (min): 19 min ? ?Charges: OT General Charges ?$OT Visit: 1 Visit ?OT Treatments ?$Self Care/Home Management : 8-22 mins ? ?Lodema Hong, OTA ?Acute Rehabilitation Services  ?Pager [phone] ?Office [phone] ? ? ?Trixie Dredge ?04/14/2021, 2:39 PM ?

## 2021-04-14 NOTE — Progress Notes (Signed)
PROGRESS NOTE    Jody [name]  VQQ:[id number] DOB: 02-24-1940 DOA: 04/04/2021 PCP: Monico Blitz, MD    Brief Narrative:  81 year old past medical history significant for ESRD on hemodialysis TTS, diabetes type 2, A-fib, COPD on chronic 2 L of oxygen, diastolic heart failure, anemia of chronic kidney disease, hypertension, history of CVA who presents to the ED with concern of left foot osteomyelitis/PAD.  She was sent from podiatry clinic.  Per patient she has been having left foot problem for about 2 weeks.  Patient has been getting IV antibiotics for the last 2 weeks with hemodialysis as an outpatient.  Culture grew Finegoldia magna.     Consultants:  Nephrology, podiatry  Procedures:   Antimicrobials:      Subjective: Has no complaints. No cp, or sob, pain controlled  Objective: Vitals:   04/14/21 0900 04/14/21 0930 04/14/21 1000 04/14/21 1016  BP: (!) 106/52 (!) 104/58 104/63   Pulse: 71 67 79   Resp: 12 10 (!) 9   Temp:    98.5 F (36.9 C)  TempSrc:      SpO2: 100% 100%  100%  Weight:   86.5 kg   Height:        Intake/Output Summary (Last 24 hours) at 04/14/2021 1520 Last data filed at 04/14/2021 1233 Gross per 24 hour  Intake 240 ml  Output 1000 ml  Net -760 ml   Filed Weights   04/13/21 0500 04/14/21 0702 04/14/21 1000  Weight: 86.1 kg 87.3 kg 86.5 kg    Examination:  Calm, NAD Cta no w/r Reg s1/s2 no gallop Soft benign +bs No edema Aaoxox3  Mood and affect appropriate in current setting    Data Reviewed: I have personally reviewed following labs and imaging studies  CBC: Recent Labs  Lab 04/08/21 0714 04/09/21 0712 04/11/21 0232 04/14/21 0725  WBC 7.1 6.0 7.2 4.7  HGB 8.1* 7.5* 8.2* 7.4*  HCT 26.5* 24.0* 25.6* 25.3*  MCV 108.6* 111.1* 109.9* 115.0*  PLT 120* 112* 132* 433   Basic Metabolic Panel: Recent Labs  Lab 04/08/21 0714 04/09/21 0712 04/11/21 0232 04/14/21 0726  NA 132* 133* 133* 135  K 3.7 4.1 4.9 4.0  CL 97* 97* 98  96*  CO2 24 26 24 28   GLUCOSE 108* 95 104* 95  BUN 20 28* 25* 23  CREATININE 3.32* 4.21* 4.11* 4.26*  CALCIUM 9.3 9.5 9.7 9.3  PHOS  --  4.3  --  3.6   GFR: Estimated Creatinine Clearance: 11.3 mL/min (A) (by C-G formula based on SCr of 4.26 mg/dL (H)). Liver Function Tests: Recent Labs  Lab 04/09/21 0712 04/14/21 0726  ALBUMIN 2.5* 2.4*   No results for input(s): LIPASE, AMYLASE in the last 168 hours. Recent Labs  Lab 04/10/21 0940  AMMONIA 43*   Coagulation Profile: No results for input(s): INR, PROTIME in the last 168 hours. Cardiac Enzymes: No results for input(s): CKTOTAL, CKMB, CKMBINDEX, TROPONINI in the last 168 hours. BNP (last 3 results) No results for input(s): PROBNP in the last 8760 hours. HbA1C: No results for input(s): HGBA1C in the last 72 hours. CBG: Recent Labs  Lab 04/13/21 1107 04/13/21 1555 04/13/21 2148 04/14/21 0635 04/14/21 1051  GLUCAP 117* 133* 139* 158* 115*   Lipid Profile: No results for input(s): CHOL, HDL, LDLCALC, TRIG, CHOLHDL, LDLDIRECT in the last 72 hours. Thyroid Function Tests: No results for input(s): TSH, T4TOTAL, FREET4, T3FREE, THYROIDAB in the last 72 hours. Anemia Panel: No results for input(s): VITAMINB12, FOLATE, FERRITIN, TIBC,  IRON, RETICCTPCT in the last 72 hours. Sepsis Labs: No results for input(s): PROCALCITON, LATICACIDVEN in the last 168 hours.  Recent Results (from the past 240 hour(s))  Blood culture (routine x 2)     Status: None   Collection Time: 04/04/21  4:40 PM   Specimen: BLOOD  Result Value Ref Range Status   Specimen Description BLOOD BLOOD RIGHT ARM  Final   Special Requests   Final    BOTTLES DRAWN AEROBIC ONLY Blood Culture adequate volume   Culture   Final    NO GROWTH 5 DAYS Performed at [facility], [address] [place], [place] [address]    Report Status 04/09/2021 FINAL  Final  Blood culture (routine x 2)     Status: None   Collection Time: 04/05/21  3:59 AM   Specimen:  BLOOD  Result Value Ref Range Status   Specimen Description BLOOD LEFT ANTECUBITAL  Final   Special Requests   Final    BOTTLES DRAWN AEROBIC AND ANAEROBIC Blood Culture results may not be optimal due to an excessive volume of blood received in culture bottles   Culture   Final    NO GROWTH 5 DAYS Performed at [facility], [place] [address] [place], [place] [address]    Report Status 04/10/2021 FINAL  Final  Resp Panel by RT-PCR (Flu A&B, Covid) Nasopharyngeal Swab     Status: None   Collection Time: 04/11/21 11:22 AM   Specimen: Nasopharyngeal Swab; Nasopharyngeal(NP) swabs in vial transport medium  Result Value Ref Range Status   SARS Coronavirus 2 by RT PCR NEGATIVE NEGATIVE Final    Comment: (NOTE) SARS-CoV-2 target nucleic acids are NOT DETECTED.  The SARS-CoV-2 RNA is generally detectable in upper respiratory specimens during the acute phase of infection. The lowest concentration of SARS-CoV-2 viral copies this assay can detect is 138 copies/mL. A negative result does not preclude SARS-Cov-2 infection and should not be used as the sole basis for treatment or other patient management decisions. A negative result may occur with  improper specimen collection/handling, submission of specimen other than nasopharyngeal swab, presence of viral mutation(s) within the areas targeted by this assay, and inadequate number of viral copies(<138 copies/mL). A negative result must be combined with clinical observations, patient history, and epidemiological information. The expected result is Negative.  Fact Sheet for Patients:  EntrepreneurPulse.com.au  Fact Sheet for Healthcare Providers:  IncredibleEmployment.be  This test is no t yet approved or cleared by the [place] FDA and  has been authorized for detection and/or diagnosis of SARS-CoV-2 by FDA under an Emergency Use Authorization (EUA). This EUA will remain  in effect (meaning this  test can be used) for the duration of the COVID-19 declaration under Section 564(b)(1) of the Act, 21 U.S.C.section 360bbb-3(b)(1), unless the authorization is terminated  or revoked sooner.       Influenza A by PCR NEGATIVE NEGATIVE Final   Influenza B by PCR NEGATIVE NEGATIVE Final    Comment: (NOTE) The Xpert Xpress SARS-CoV-2/FLU/RSV plus assay is intended as an aid in the diagnosis of influenza from Nasopharyngeal swab specimens and should not be used as a sole basis for treatment. Nasal washings and aspirates are unacceptable for Xpert Xpress SARS-CoV-2/FLU/RSV testing.  Fact Sheet for Patients: EntrepreneurPulse.com.au  Fact Sheet for Healthcare Providers: IncredibleEmployment.be  This test is not yet approved or cleared by the [place] FDA and has been authorized for detection and/or diagnosis of SARS-CoV-2 by FDA under an Emergency  Use Authorization (EUA). This EUA will remain in effect (meaning this test can be used) for the duration of the COVID-19 declaration under Section 564(b)(1) of the Act, 21 U.S.C. section 360bbb-3(b)(1), unless the authorization is terminated or revoked.  Performed at [facility], [place] [address], [place], [place] [address]          Radiology Studies: DG Foot Complete Left  Result Date: 04/12/2021 Please see detailed radiograph report in office note.       Scheduled Meds:  (feeding supplement) PROSource Plus  30 mL Oral BID BM   acetaminophen  500 mg Oral TID   apixaban  2.5 mg Oral BID   aspirin EC  81 mg Oral Daily   atorvastatin  40 mg Oral Daily   Chlorhexidine Gluconate Cloth  6 each Topical Daily   darbepoetin (ARANESP) injection - DIALYSIS  200 mcg Intravenous Q Thu-HD   ferrous sulfate  325 mg Oral BID WC   gabapentin  100 mg Oral QHS   insulin aspart  0-6 Units Subcutaneous TID WC   metoprolol tartrate  12.5 mg Oral BID   pramipexole  0.125 mg Oral Daily   sevelamer  carbonate  800 mg Oral TID WC   sodium chloride flush  3 mL Intravenous Q12H   vitamin B-12  100 mcg Oral Daily   Continuous Infusions:  sodium chloride     ceFEPime (MAXIPIME) IV Stopped (04/14/21 1053)    Assessment & Plan:   Principal Problem:   Acute osteomyelitis of left foot ([place]) Active Problems:   Essential hypertension   COPD with chornic respiratory failure on oxygen 2L [place]   Atrial fibrillation (HCC)   Chronic diastolic CHF (congestive heart failure) (HCC)   Type 2 diabetes mellitus with nephropathy ([place])   ESRD on dialysis ([place])   History of CVA (cerebrovascular accident)   Chronic respiratory failure (HCC)   Anemia of chronic kidney failure   Hyponatremia   Memory loss   Hypokalemia   Sinus bradycardia   Acute metabolic encephalopathy   Leg pain   Acute osteomyelitis of left foot ([place])- (present on admission) Presents with Acute osteomyelitis of left big toe, failed IV abx outpatient. -Has been receiving IV vancomycin and cefepime at dialysis since 03/24/21. -Wound culture grew out finegoldia magna. -Continue with Cefepime with HD -Dr Jacqualyn Posey recommend Partial first ray amputation.  -ABI: Right and left resting right ankle-brachial index indicate noncompressible right lower extremity arteries.  Right and left toe brachial index is abnormal. -Underwent  arteriogram 2/22: Successful drug-coated balloon angioplasty of the superficial femoral artery. Unsuccessful recanalization of the anterior tibial artery. -Underwent first toe left foot amputation 2/24.   -ID consult osteomyelitis positive  surgical margined.  3/2 ID recommended 4 weeks of cefepime with HD    Leg pain Complaints of leg pain. Likely related to Sx.  3/2 Doppler negative for DVT     Acute metabolic encephalopathy She has been more confuse. Suspect hospital delirium, medication, infection.  Delirium precaution.  Ammonia mildly elevated at 43. Received lactulose.   Reduce gabapentin to daily.   ABG; PCO2: 56, PH compensated.  BIPAP at HS and with naps. Didn't tolerated. She is more alert.  Her mental status fluctuates. Per nurse she was alert and conversant  2/27 afternoon. 3/2 improved.       Sinus bradycardia Asymptomatic.  Hold Cardizem  Holder parameter for metoprolol.  3/2 improved   Hypokalemia correction with HD   Memory loss POA [name], asked  that she be called for medical decisions.  Delirium precautions  B12: 358, started  Supplement.   Hyponatremia Likely secondary to renal failure. Continue to follow.   Anemia of chronic kidney failure Baseline 7-8 No IV iron with infection. On Aranesp.  Received one unit PRBC 2/22. Hb 8.2   Chronic respiratory failure (HCC) Stable on 2 L oxygen.    History of CVA (cerebrovascular accident) On /eliquis,    ESRD on dialysis ([place]) On dialysis TTS. Nephrology following.  Continue with HD>    Type 2 diabetes mellitus with nephropathy ([place])- (present on admission) a1c of 5.3 in 02/2021. SSI    Chronic diastolic CHF (congestive heart failure) ([place])- (present on admission) Last echo: 06/2020: EF of 45-50% with mildly reduced LVF. Hypokinesis of the inferoseptal, inferolateral and distal inferior walls . Diastolic parameters indeterminate.  Volume management with HD  Discontinue lasix.    Atrial fibrillation ([place])- (present on admission) Rate controlled. Holding Cardizem and Metoprolol due to Bradycardia.  Continue with Eliquis.    COPD with chornic respiratory failure on oxygen 2L [place]- (present on admission) On chronic 2L oxygen, continue No signs of exacerbation. Continue with albuterol prn    Essential hypertension- (present on admission) Hold  cardizem 120mg  daily and holder parameter for metoprolol due to bradycardia     DVT prophylaxis: eliquis Code Status:full Family Communication: none at bedisde Disposition Plan:  Status is: Inpatient Remains inpatient appropriate because: safe  discharge. Awaiting insurance authorization.                 LOS: 10 days   Time spent: 35 min     Nolberto Hanlon, MD [facility] Pager [phone]  If 7PM-7AM, please contact night-coverage 04/14/2021, 3:20 PM

## 2021-04-14 NOTE — Progress Notes (Signed)
Pharmacy Antibiotic Note ? ?Jody [name] is a 81 y.o. female admitted on 04/04/2021 with L-foot oste still with residual osteo s/p amputation. Pharmacy has been consulted for Ceftepime dosing with plans to continue with HD for 4 weeks through 3/28.  ? ?The patient is ERSD with HD on TTS - s/p HD and cefepime dose today.  ? ?Plan: ?- Continue Cefepime 2g on TTS after HD ?- Planned for 4 weeks through 3/28 ?- Will continue to follow HD schedule/duration, culture results, LOT, and antibiotic de-escalation plans  ? ?Height: 5\' 7"  (170.2 cm) ?Weight: 86.5 kg (190 lb 11.2 oz) ?IBW/kg (Calculated) : 61.6 ? ?Temp (24hrs), Avg:98.3 ?F (36.8 ?C), Min:97.4 ?F (36.3 ?C), Max:98.5 ?F (36.9 ?C) ? ?Recent Labs  ?Lab 04/08/21 ?4034 04/09/21 ?7425 04/11/21 ?0232 04/14/21 ?0725 04/14/21 ?9563  ?WBC 7.1 6.0 7.2 4.7  --   ?CREATININE 3.32* 4.21* 4.11*  --  4.26*  ? ?  ?Estimated Creatinine Clearance: 11.3 mL/min (A) (by C-G formula based on SCr of 4.26 mg/dL (H)).   ? ?Allergies  ?Allergen Reactions  ? Codeine Nausea And Vomiting  ? ? ?Thank you for allowing pharmacy to be a part of this patient?s care. ? ?[place] Crane, Pharm D, BCPS, BCCP ?Clinical Pharmacist ? 04/14/2021 11:35 AM  ? ?[facility] pharmacy phone numbers are listed on [email or web address] ? ? ?

## 2021-04-14 NOTE — Progress Notes (Signed)
Report given to [facility], Therapist, sports. Awaiting transport to HD. ?

## 2021-04-14 NOTE — TOC Progression Note (Signed)
Transition of Care (TOC) - Progression Note  ? ? ?Patient Details  ?[name] ?MRN: [id number] ?Date of Birth: [birth year] ? ?Transition of Care (TOC) CM/SW Contact  ?Vinie Sill, LCSW ?Phone Number: ?04/14/2021, 4:46 PM ? ?Clinical Narrative:    ? ?CSW informed [facility] patient insurance is under review and peer to peer has been completed by MD. Insurance requested additional PT notes, regarding weight bearing status, information not available, they will have to make decision with the information- not sure if she will authorized for therapy. [facility] confirmed patient can still return tomorrow if her insurance declines authorization for therapy.  Patient will need covid test before she is discharged to SNF.  ? ?CSW informed Renal Navigator of possible discharge tomorrow.  ?TOC will continue to follow and assist with discharge planning. ? ?Thurmond Butts, MSW, LCSW ?Clinical Social Worker ? ? ? ?Expected Discharge Plan: [place] ?Barriers to Discharge: Continued Medical Work up ? ?Expected Discharge Plan and Services ?Expected Discharge Plan: [name] ?In-house Referral: Clinical Social Work ?  ?  ?Living arrangements for the past 2 months: [place] ?Expected Discharge Date: 04/13/21               ?  ?  ?  ?  ?  ?  ?  ?  ?  ?  ? ? ?Social Determinants of Health (SDOH) Interventions ?  ? ?Readmission Risk Interventions ?Readmission Risk Prevention Plan 12/30/2019 10/27/2019  ?Transportation Screening Complete Complete  ?PCP or Specialist Appt within 5-7 Days - Complete  ?Home Care Screening - Complete  ?Medication Review (RN CM) - Complete  ?[place] or Home Care Consult Complete -  ?Social Work Consult for [place] Planning/Counseling Complete -  ?Palliative Care Screening Not Complete -  ?Medication Review Press photographer) Complete -  ?Some recent data might be hidden  ? ? ?

## 2021-04-14 NOTE — Progress Notes (Signed)
?[facility] ?Progress Note  ? ?Subjective:  Seen and examined on HD.  BFR 400 mL/ min, UF goal 1.2L.  Doing well on rx.   ? ? ?Objective ?Vitals:  ? 04/14/21 0900 04/14/21 0930 04/14/21 1000 04/14/21 1016  ?BP: (!) 106/52 (!) 104/58 104/63   ?Pulse: 71 67 79   ?Resp: 12 10 (!) 9   ?Temp:    98.5 ?F (36.9 ?C)  ?TempSrc:      ?SpO2: 100% 100%  100%  ?Weight:   86.5 kg   ?Height:      ? ?Physical Exam ?General: Elderly woman, NAD, sleeping but arousable ?Heart: Irreg irregular, no murmur ?Lungs: CTA anteriorly ?Abdomen: soft ?Extremities: No LE edema; L foot wrapped ?Dialysis Access: RUE AVF + bruit ? ?Additional Objective ?Labs: ?Basic Metabolic Panel: ?Recent Labs  ?Lab 04/09/21 ?0938 04/11/21 ?0232 04/14/21 ?0726  ?NA 133* 133* 135  ?K 4.1 4.9 4.0  ?CL 97* 98 96*  ?CO2 26 24 28   ?GLUCOSE 95 104* 95  ?BUN 28* 25* 23  ?CREATININE 4.21* 4.11* 4.26*  ?CALCIUM 9.5 9.7 9.3  ?PHOS 4.3  --  3.6  ? ?Liver Function Tests: ?Recent Labs  ?Lab 04/09/21 ?1829 04/14/21 ?0726  ?ALBUMIN 2.5* 2.4*  ? ?CBC: ?Recent Labs  ?Lab 04/08/21 ?0714 04/09/21 ?9371 04/11/21 ?0232 04/14/21 ?0725  ?WBC 7.1 6.0 7.2 4.7  ?HGB 8.1* 7.5* 8.2* 7.4*  ?HCT 26.5* 24.0* 25.6* 25.3*  ?MCV 108.6* 111.1* 109.9* 115.0*  ?PLT 120* 112* 132* 159  ? ?Blood Culture ?   ?Component Value Date/Time  ? SDES BLOOD LEFT ANTECUBITAL 04/05/2021 0359  ? SPECREQUEST  04/05/2021 0359  ?  BOTTLES DRAWN AEROBIC AND ANAEROBIC Blood Culture results may not be optimal due to an excessive volume of blood received in culture bottles  ? CULT  04/05/2021 0359  ?  NO GROWTH 5 DAYS ?Performed at [facility], [place] [address], [place], [place] [address] ?  ? REPTSTATUS 04/10/2021 FINAL 04/05/2021 0359  ? ?Studies/Results: ?DG Foot Complete Left ? ?Result Date: 04/12/2021 ?Please see detailed radiograph report in office note.  ?Medications: ? sodium chloride    ? ceFEPime (MAXIPIME) IV Stopped (04/14/21 1053)  ? ? (feeding supplement) PROSource Plus  30 mL Oral BID  BM  ? acetaminophen  500 mg Oral TID  ? apixaban  2.5 mg Oral BID  ? aspirin EC  81 mg Oral Daily  ? atorvastatin  40 mg Oral Daily  ? Chlorhexidine Gluconate Cloth  6 each Topical Daily  ? clopidogrel  75 mg Oral Q breakfast  ? darbepoetin (ARANESP) injection - DIALYSIS  200 mcg Intravenous Q Thu-HD  ? ferrous sulfate  325 mg Oral BID WC  ? gabapentin  100 mg Oral QHS  ? insulin aspart  0-6 Units Subcutaneous TID WC  ? metoprolol tartrate  12.5 mg Oral BID  ? pramipexole  0.125 mg Oral Daily  ? sevelamer carbonate  800 mg Oral TID WC  ? sodium chloride flush  3 mL Intravenous Q12H  ? vitamin B-12  100 mcg Oral Daily  ? ? ?OP HD: TTS [facility] on 05/11/20. ?3h 36min   79.5kg     2K/2.5Ca bath  RUE AVF  15g   Hep none ?- Hep B SAg neg 2/20, COVID neg 2/20 ?- Calcitriol 1.23mcg PO q HD ?- Mircera 229mcg IV q 2 weeks ?- Venofer 50mg  IV weekly ?- On Vanc 1g and Cefepime 2g since 2/9. Wound Cx  at OP clinic: Finegoldia magna ?  ?Assessment/Plan: ? L foot osteomyelitis: IV abx broad-spec since admit on 2/20 (van'c cefepime/ flagyl) Afebrile. BCxs negative. SP L great toe amp on 2/24. Stop date 05/10/21- antibiotics (vanc/ cefepime) can be given at [facility]. ?PAD: ABIs abnormal, s/p angioplasty to L SFA by [facility] on 2/22. ? ESRD: on HD TTS. Next HD 04/14/21  ? Hypertension/volume: overall soft BP. Diltiazem stopped, getting metoprolol bid. Wt's appear inaccurate, up 7kg but not vol overloaded by exam and BP's soft.  ? Anemia: Hgb 7.7, s/p 1U PRBCs on 2/22. No IV iron d/t infection.  SP darbe 200 ug on 2/23. Ordered weekly.  ? Metabolic bone disease: Ca 10.2 on admit, VDRA on hold. Continue Renvela as binder. ? Nutrition:  Alb low (3.1) - continue supplements. ?A-fib: On metoprolol 12.5 bid, po diltiazem and eliquis dc'd ?Memory loss - confused, prob delirium, seems better today ?COPD ?Hx CVA ?Dispo: ok to d/c from renal perspective.   ? ?Madelon Lips, MD ?04/14/2021, 12:04 PM ? ? ? ? ? ? ? ?

## 2021-04-14 NOTE — TOC Progression Note (Signed)
Transition of Care (TOC) - Progression Note  ? ? ?Patient Details  ?[name] ?MRN: [id number] ?Date of Birth: 1940-05-05 ? ?Transition of Care (TOC) CM/SW Contact  ?Vinie Sill, LCSW ?Phone Number: ?04/14/2021, 9:54 AM ? ?Clinical Narrative:    ? ?Informed  attending of peer to peer request that must be completed by 2pm today. ? ?Expected Discharge Plan: [place] ?Barriers to Discharge: Continued Medical Work up ? ?Expected Discharge Plan and Services ?Expected Discharge Plan: [place] ?In-house Referral: Clinical Social Work ?  ?  ?Living arrangements for the past 2 months: [place] ?Expected Discharge Date: 04/13/21               ?  ?  ?  ?  ?  ?  ?  ?  ?  ?  ? ? ?Social Determinants of Health (SDOH) Interventions ?  ? ?Readmission Risk Interventions ?Readmission Risk Prevention Plan 12/30/2019 10/27/2019  ?Transportation Screening Complete Complete  ?PCP or Specialist Appt within 5-7 Days - Complete  ?Home Care Screening - Complete  ?Medication Review (RN CM) - Complete  ?[place] or Home Care Consult Complete -  ?Social Work Consult for [place] Planning/Counseling Complete -  ?Palliative Care Screening Not Complete -  ?Medication Review Press photographer) Complete -  ?Some recent data might be hidden  ? ? ?

## 2021-04-15 DIAGNOSIS — H524 Presbyopia: Secondary | ICD-10-CM | POA: Diagnosis not present

## 2021-04-15 DIAGNOSIS — Z743 Need for continuous supervision: Secondary | ICD-10-CM | POA: Diagnosis not present

## 2021-04-15 DIAGNOSIS — M6281 Muscle weakness (generalized): Secondary | ICD-10-CM | POA: Diagnosis not present

## 2021-04-15 DIAGNOSIS — I509 Heart failure, unspecified: Secondary | ICD-10-CM | POA: Diagnosis not present

## 2021-04-15 DIAGNOSIS — B351 Tinea unguium: Secondary | ICD-10-CM | POA: Diagnosis not present

## 2021-04-15 DIAGNOSIS — Z7401 Bed confinement status: Secondary | ICD-10-CM | POA: Diagnosis not present

## 2021-04-15 DIAGNOSIS — M79675 Pain in left toe(s): Secondary | ICD-10-CM | POA: Diagnosis not present

## 2021-04-15 DIAGNOSIS — Z299 Encounter for prophylactic measures, unspecified: Secondary | ICD-10-CM | POA: Diagnosis not present

## 2021-04-15 DIAGNOSIS — M79674 Pain in right toe(s): Secondary | ICD-10-CM | POA: Diagnosis not present

## 2021-04-15 DIAGNOSIS — J9611 Chronic respiratory failure with hypoxia: Secondary | ICD-10-CM | POA: Diagnosis not present

## 2021-04-15 DIAGNOSIS — I739 Peripheral vascular disease, unspecified: Secondary | ICD-10-CM | POA: Diagnosis not present

## 2021-04-15 DIAGNOSIS — R1310 Dysphagia, unspecified: Secondary | ICD-10-CM | POA: Diagnosis not present

## 2021-04-15 DIAGNOSIS — R2689 Other abnormalities of gait and mobility: Secondary | ICD-10-CM | POA: Diagnosis not present

## 2021-04-15 DIAGNOSIS — E1122 Type 2 diabetes mellitus with diabetic chronic kidney disease: Secondary | ICD-10-CM | POA: Diagnosis not present

## 2021-04-15 DIAGNOSIS — R059 Cough, unspecified: Secondary | ICD-10-CM | POA: Diagnosis not present

## 2021-04-15 DIAGNOSIS — I10 Essential (primary) hypertension: Secondary | ICD-10-CM | POA: Diagnosis not present

## 2021-04-15 DIAGNOSIS — R6889 Other general symptoms and signs: Secondary | ICD-10-CM | POA: Diagnosis not present

## 2021-04-15 DIAGNOSIS — R531 Weakness: Secondary | ICD-10-CM | POA: Diagnosis not present

## 2021-04-15 DIAGNOSIS — Z9889 Other specified postprocedural states: Secondary | ICD-10-CM | POA: Diagnosis not present

## 2021-04-15 DIAGNOSIS — I5032 Chronic diastolic (congestive) heart failure: Secondary | ICD-10-CM | POA: Diagnosis not present

## 2021-04-15 DIAGNOSIS — N186 End stage renal disease: Secondary | ICD-10-CM | POA: Diagnosis not present

## 2021-04-15 DIAGNOSIS — J9610 Chronic respiratory failure, unspecified whether with hypoxia or hypercapnia: Secondary | ICD-10-CM | POA: Diagnosis not present

## 2021-04-15 DIAGNOSIS — Z9981 Dependence on supplemental oxygen: Secondary | ICD-10-CM | POA: Diagnosis not present

## 2021-04-15 DIAGNOSIS — J9601 Acute respiratory failure with hypoxia: Secondary | ICD-10-CM | POA: Diagnosis not present

## 2021-04-15 DIAGNOSIS — M86172 Other acute osteomyelitis, left ankle and foot: Secondary | ICD-10-CM | POA: Diagnosis not present

## 2021-04-15 DIAGNOSIS — G9341 Metabolic encephalopathy: Secondary | ICD-10-CM | POA: Diagnosis not present

## 2021-04-15 DIAGNOSIS — J449 Chronic obstructive pulmonary disease, unspecified: Secondary | ICD-10-CM | POA: Diagnosis not present

## 2021-04-15 DIAGNOSIS — L97509 Non-pressure chronic ulcer of other part of unspecified foot with unspecified severity: Secondary | ICD-10-CM | POA: Diagnosis not present

## 2021-04-15 DIAGNOSIS — Z89412 Acquired absence of left great toe: Secondary | ICD-10-CM | POA: Diagnosis not present

## 2021-04-15 DIAGNOSIS — S98112A Complete traumatic amputation of left great toe, initial encounter: Secondary | ICD-10-CM | POA: Diagnosis not present

## 2021-04-15 DIAGNOSIS — Z992 Dependence on renal dialysis: Secondary | ICD-10-CM | POA: Diagnosis not present

## 2021-04-15 DIAGNOSIS — Z7901 Long term (current) use of anticoagulants: Secondary | ICD-10-CM | POA: Diagnosis not present

## 2021-04-15 DIAGNOSIS — Z87891 Personal history of nicotine dependence: Secondary | ICD-10-CM | POA: Diagnosis not present

## 2021-04-15 LAB — GLUCOSE, CAPILLARY
Glucose-Capillary: 96 mg/dL (ref 70–99)
Glucose-Capillary: 125 mg/dL — ABNORMAL HIGH (ref 70–99)
Glucose-Capillary: 116 mg/dL — ABNORMAL HIGH (ref 70–99)

## 2021-04-15 LAB — MRSA NEXT GEN BY PCR, NASAL: MRSA by PCR Next Gen: NOT DETECTED

## 2021-04-15 LAB — SARS CORONAVIRUS 2 (TAT 6-24 HRS): SARS Coronavirus 2: NEGATIVE

## 2021-04-15 NOTE — Evaluation (Signed)
Physical Therapy Re-Evaluation Patient Details [name] MRN: [id number] DOB: October 31, 1940 Today's Date: 04/15/2021  History of Present Illness  81 y.o. female presents to [facility] hospital on 04/04/2021 with concerns for osteomyelitis of L foot. S/p angiogram with SFA drug coated balloon angioplasty and unsuccessful recannulization of the ATA 2/22. S/p L first ray amp on 2/24. Ultrasound taken of lower extremities 2/26 negative for DVT.  PMH includes ESRD, DMII, Afib, COPD, CHF, HTN, CVA.  Clinical Impression  Pt admitted with above diagnosis. Pt continues to progress and was more alert today.  Pt needed less assist with bed mobility and still struggles with sit to stand without +2 assist.  Pt has difficulty following commands due to poor cognition and poor safety awareness.  Pt needs cues to follow weight bearing precautions as pt with difficulty placing weight on heel on left with Darco shoe.  Pt also limited by weakness as well. BP fairly stable today with pt still c/o dizziness with movement.  Asked pt about vertigo however difficult to assess this as pt keeps eyes closed.  Continue toward goals with plan for pt to go to SNF today per chart.  Pt currently with functional limitations due to balance and endurance deficits. Pt will benefit from skilled PT to increase their independence and safety with mobility to allow discharge to the venue listed below.          Recommendations for follow up therapy are one component of a multi-disciplinary discharge planning process, led by the attending physician.  Recommendations may be updated based on patient status, additional functional criteria and insurance authorization.  Follow Up Recommendations Skilled nursing-short term rehab (<3 hours/day)    Assistance Recommended at Discharge Frequent or constant Supervision/Assistance  Patient can return home with the following  Two people to help with walking and/or transfers;Two people to help with  bathing/dressing/bathroom;Assistance with cooking/housework;Assistance with feeding;Direct supervision/assist for medications management;Direct supervision/assist for financial management;Assist for transportation;Help with stairs or ramp for entrance    Equipment Recommendations Other (comment) (defer to next venue of care)  Recommendations for Other Services       Functional Status Assessment Patient has had a recent decline in their functional status and demonstrates the ability to make significant improvements in function in a reasonable and predictable amount of time.     Precautions / Restrictions Precautions Precautions: Fall;Other (comment) Precaution Comments: close vitals monitor Required Braces or Orthoses: Other Brace Other Brace: Darco shoe Restrictions Weight Bearing Restrictions: Yes LLE Weight Bearing: Partial weight bearing (on Heel only with Darco shoe) LLE Partial Weight Bearing Percentage or Pounds: 50%      Mobility  Bed Mobility Overal bed mobility: Needs Assistance Bed Mobility: Rolling, Sidelying to Sit, Sit to Supine Rolling: Min assist, +2 for safety/equipment Sidelying to sit: HOB elevated, Mod assist, Min assist, +2 for safety/equipment       General bed mobility comments: neeeded total assist to don Darco shoe.  Pt needed min assist to roll with pt able to reach for the armrest  and initiating LE movement to EOB.  Used pad to assist pt to EOB as well.    Transfers Overall transfer level: Needs assistance Equipment used: Ambulation equipment used, Rolling walker (2 wheels) Transfers: Sit to/from Stand, Bed to chair/wheelchair/BSC Sit to Stand: Max assist, +2 physical assistance, Mod assist, From elevated surface           General transfer comment: Pt only able to lift buttocks an inch from raised bed when she  tried to stand to RW with mod to max assist by PT.  Pt able to initiate stand to [facility] with mod assist with use of pad and gait belt and  was able to stand enough to place pads under buttocks.  Pt with right lateral lean in [place] and had difficulty understanding the darco shoe and that she needed to weight bear on the heel and not the toes. Even with max asssist and cues it was difficult to get pt to understand to use UES and unweight the toes on left and to stand tall.  Pt also had difficulty processing to sit down as she gets internally distracted and then cant follow instructions.  Went ahead and moved pt in [place] to the chair and had her stand again trying to keep weight off of the left foot and then pt sat in recliner.  Maximove pad in place for safe return and nurse made aware to please get pt back to bed in 1-2 hours. Transfer via Lift Equipment: [place]  Ambulation/Gait               General Gait Details: Biomedical engineer Rankin (Stroke Patients Only)       Balance Overall balance assessment: Needs assistance Sitting-balance support: No upper extremity supported, Feet supported Sitting balance-Leahy Scale: Fair Sitting balance - Comments: Static sitting and weight shifting EOB with mostly min guard to minA trunk support Postural control: Posterior lean, Right lateral lean Standing balance support: Bilateral upper extremity supported Standing balance-Leahy Scale: Zero Standing balance comment: mod to max A +2 for static standing in [place], leans heavy to right and posterior                             Pertinent Vitals/Pain Pain Assessment Pain Assessment: Faces Faces Pain Scale: Hurts little more Pain Location: headache Pain Descriptors / Indicators: Discomfort, Grimacing, Guarding Pain Intervention(s): Limited activity within patient's tolerance, Monitored during session, Repositioned, Patient requesting pain meds-RN notified    Home Living Family/patient expects to be discharged to:: Skilled nursing facility                   Additional  Comments: Pt went to SNF around 03/07/21, but prior to this was living alone with an aide coming 5x/week and occasional assistance from nieces and nephew. Information from chart review and niece confirmed.    Prior Function Prior Level of Function : History of Falls (last six months);Needs assist       Physical Assist : Mobility (physical);ADLs (physical) Mobility (physical): Bed mobility;Transfers;Gait;Stairs   Mobility Comments: Prior to hospitalization and going to SNF in January 2023, pt was ambulating household distances mod I with RW ADLs Comments: Prior to hospitalization and going to SNF in January 2023, pt was getting assistance from an aide M-F for transportation, cleaning, etc. Pt was doing bird baths at sink.     Hand Dominance   Dominant Hand: Right    Extremity/Trunk Assessment   Upper Extremity Assessment Upper Extremity Assessment: Defer to OT evaluation RUE Coordination: decreased fine motor;decreased gross motor LUE Deficits / Details: Decreased grasp strength. Also noting jerky movement and pt "dropping" her arms.    Lower Extremity Assessment Lower Extremity Assessment: RLE deficits/detail;LLE deficits/detail RLE Deficits / Details: grossly 3-/5 LLE Deficits / Details: ankle 2+/5, knee 3-/5, hip 3-/5  Cervical / Trunk Assessment Cervical / Trunk Assessment: Kyphotic  Communication   Communication: No difficulties  Cognition Arousal/Alertness: Awake/alert Behavior During Therapy: Restless, Anxious Overall Cognitive Status: No family/caregiver present to determine baseline cognitive functioning                                 General Comments: Patient able to use wall calendar for date.  Oriented to self        General Comments General comments (skin integrity, edema, etc.): VSS on 3LO2,  BP 92/48 initially.  106/72 once sitting EOB.  Pt was c/o dizziness but was able to work throught it.    Exercises General Exercises - Lower  Extremity Ankle Circles/Pumps: AROM, Both, 10 reps, Supine Quad Sets: AROM, Both, 10 reps, Supine Heel Slides: AAROM, Both, 10 reps, Supine   Assessment/Plan    PT Assessment Patient needs continued PT services  PT Problem List Decreased strength;Decreased activity tolerance;Decreased balance;Decreased range of motion;Decreased mobility;Decreased knowledge of use of DME;Decreased cognition;Cardiopulmonary status limiting activity       PT Treatment Interventions DME instruction;Gait training;Functional mobility training;Therapeutic activities;Therapeutic exercise;Balance training;Neuromuscular re-education;Cognitive remediation;Patient/family education;Wheelchair mobility training    PT Goals (Current goals can be found in the Care Plan section)  Acute Rehab PT Goals Patient Stated Goal: to sit up more PT Goal Formulation: With patient/family Time For Goal Achievement: 04/21/21 Potential to Achieve Goals: Fair    Frequency Min 2X/week     Co-evaluation               AM-PAC PT "6 Clicks" Mobility  Outcome Measure Help needed turning from your back to your side while in a flat bed without using bedrails?: A Little Help needed moving from lying on your back to sitting on the side of a flat bed without using bedrails?: Total Help needed moving to and from a bed to a chair (including a wheelchair)?: Total Help needed standing up from a chair using your arms (e.g., wheelchair or bedside chair)?: Total Help needed to walk in hospital room?: Total Help needed climbing 3-5 steps with a railing? : Total 6 Click Score: 8    End of Session Equipment Utilized During Treatment: Oxygen;Gait belt Activity Tolerance: Patient tolerated treatment well Patient left: with call bell/phone within reach;in chair;with chair alarm set Nurse Communication: Mobility status;Other (comment);Need for lift equipment;Patient requests pain meds (chair alarm on, lift pad underneath use lift with +2) PT  Visit Diagnosis: Unsteadiness on feet (R26.81);Muscle weakness (generalized) (M62.81);History of falling (Z91.81);Difficulty in walking, not elsewhere classified (R26.2)    Time: 6270-3500 PT Time Calculation (min) (ACUTE ONLY): 40 min   Charges:   PT Evaluation $PT Re-evaluation: 1 Re-eval PT Treatments $Therapeutic Exercise: 8-22 mins $Therapeutic Activity: 8-22 mins        Jody [name] M,PT Acute Rehab Services [phone] (pager)   Jody [name] 04/15/2021, 1:32 PM

## 2021-04-15 NOTE — TOC Progression Note (Addendum)
Transition of Care (TOC) - Progression Note  ? ? ?Patient Details  ?Name: Delora Gravatt ?MRN: [id number] ?Date of Birth: 02-15-1940 ? ?Transition of Care (TOC) CM/SW Contact  ?Vinie Sill, LCSW ?Phone Number: ?04/15/2021, 12:03 PM ? ?Clinical Narrative:    ? ?Received message form [facility]- insurance authorization was denied, updated PT notes were requested by 3pm- no updated clinicals were available.  ? ?Expected Discharge Plan: [place] ?Barriers to Discharge: Continued Medical Work up ? ?Expected Discharge Plan and Services ?Expected Discharge Plan: [place] ?In-house Referral: Clinical Social Work ?  ?  ?Living arrangements for the past 2 months: [place] ?Expected Discharge Date: 04/15/21               ?  ?  ?  ?  ?  ?  ?  ?  ?  ?  ? ? ?Social Determinants of Health (SDOH) Interventions ?  ? ?Readmission Risk Interventions ?Readmission Risk Prevention Plan 12/30/2019 10/27/2019  ?Transportation Screening Complete Complete  ?PCP or Specialist Appt within 5-7 Days - Complete  ?Home Care Screening - Complete  ?Medication Review (RN CM) - Complete  ?[place] or Home Care Consult Complete -  ?Social Work Consult for [place] Planning/Counseling Complete -  ?Palliative Care Screening Not Complete -  ?Medication Review Press photographer) Complete -  ?Some recent data might be hidden  ? ? ?

## 2021-04-15 NOTE — Progress Notes (Signed)
Received call back from RN Roxanne at [facility].  Report given, all questions answered. ?

## 2021-04-15 NOTE — Progress Notes (Signed)
Attempted to call report - left a VM ?

## 2021-04-15 NOTE — Progress Notes (Signed)
Patient left with [facility] on a stretcher to go to [facility].  VSS   O2 on at 2L/min n/c   Denies any pain.  No distress noted.  2 bags of clothes sent with patient and her wheelchair placed in the equipment room for family to pick up.  Roxanne at [facility] aware that patient is on her way   ?

## 2021-04-15 NOTE — Care Management Important Message (Signed)
Important Message ? ?Patient Details  ?[name] ?MRN: [id number] ?Date of Birth: 1940-02-19 ? ? ?Medicare Important Message Given:  Yes ? ? ? ? ?Shelda Altes ?04/15/2021, 10:37 AM ?

## 2021-04-15 NOTE — TOC Transition Note (Signed)
Transition of Care (TOC) - CM/SW Discharge Note ? ? ?Patient Details  ?[name] ?MRN: [id number] ?Date of Birth: February 20, 1940 ? ?Transition of Care (TOC) CM/SW Contact:  ?Vinie Sill, LCSW ?Phone Number: ?04/15/2021, 1:08 PM ? ? ?Clinical Narrative:    ? ?Patient will Discharge to: [facility] ?Discharge Date: 04/15/2021 ?Family Notified: niece,[name] ?Transport [id number] ? ?Per MD patient is ready for discharge. RN, patient, and facility notified of discharge. Discharge Summary sent to facility. RN given number for [phone]. Ambulance transport requested for patient.  ? ?Clinical Social Worker signing off. ? ?Thurmond Butts, MSW, LCSW ?Clinical Social Worker ? ? ? ?Final next level of care: [place] ?Barriers to Discharge: Barriers Resolved ? ? ?Patient Goals and CMS Choice ?  ?  ?  ? ?Discharge Placement ?  ?           ?Patient chooses bed at:  [facility]) ?Patient to be transferred to facility by: PTAR ?Name of family member notified: neice, [name] ?Patient and family notified of of transfer: 04/15/21 ? ?Discharge Plan and Services ?In-house Referral: Clinical Social Work ?  ?           ?  ?  ?  ?  ?  ?  ?  ?  ?  ?  ? ?Social Determinants of Health (SDOH) Interventions ?  ? ? ?Readmission Risk Interventions ?Readmission Risk Prevention Plan 12/30/2019 10/27/2019  ?Transportation Screening Complete Complete  ?PCP or Specialist Appt within 5-7 Days - Complete  ?Home Care Screening - Complete  ?Medication Review (RN CM) - Complete  ?[place] or Home Care Consult Complete -  ?Social Work Consult for [place] Planning/Counseling Complete -  ?Palliative Care Screening Not Complete -  ?Medication Review Press photographer) Complete -  ?Some recent data might be hidden  ? ? ? ? ? ?

## 2021-04-15 NOTE — Progress Notes (Signed)
Pt to d/c to snf today. Contacted [facility] and spoke to [place]. Clinic aware pt will d/c today and will resume care tomorrow. Spoke to Dr Hollie Salk regarding pt's iv abx needs at d/c. Dr Hollie Salk confirmed that pt will need cefepime only. This information provided to [place] at [place] and d/c summary addendum faxed to clinic today [phone]) with abx noted. Clinic confirms that pt can receive cefepime with HD at d/c.  ? ?Melven Sartorius ?Renal Navigator ?[phone] ?

## 2021-04-15 NOTE — Progress Notes (Signed)
Orthopedic Tech Progress Note ?Patient Details:  ?Odyssey Vasbinder ?01-Feb-1941 ?[id number] ? ?Ortho Devices ?Type of Ortho Device: Darco shoe ?Ortho Device/Splint Interventions: Ordered ?  ?  ? ?Lafreda Casebeer A Philomene Haff ?04/15/2021, 12:07 PM ? ?

## 2021-04-16 DIAGNOSIS — N186 End stage renal disease: Secondary | ICD-10-CM | POA: Diagnosis not present

## 2021-04-16 DIAGNOSIS — Z992 Dependence on renal dialysis: Secondary | ICD-10-CM | POA: Diagnosis not present

## 2021-04-18 ENCOUNTER — Telehealth: Payer: Self-pay | Admitting: Podiatry

## 2021-04-18 DIAGNOSIS — S98112A Complete traumatic amputation of left great toe, initial encounter: Secondary | ICD-10-CM | POA: Diagnosis not present

## 2021-04-18 DIAGNOSIS — Z299 Encounter for prophylactic measures, unspecified: Secondary | ICD-10-CM | POA: Diagnosis not present

## 2021-04-18 DIAGNOSIS — I10 Essential (primary) hypertension: Secondary | ICD-10-CM | POA: Diagnosis not present

## 2021-04-18 DIAGNOSIS — I509 Heart failure, unspecified: Secondary | ICD-10-CM | POA: Diagnosis not present

## 2021-04-18 DIAGNOSIS — Z992 Dependence on renal dialysis: Secondary | ICD-10-CM | POA: Diagnosis not present

## 2021-04-18 DIAGNOSIS — Z87891 Personal history of nicotine dependence: Secondary | ICD-10-CM | POA: Diagnosis not present

## 2021-04-18 NOTE — Telephone Encounter (Signed)
[facility] called and scheduled pt for post op appt. I offered her several sooner appts but they could not get pt here until 3.17.2023 ?

## 2021-04-19 DIAGNOSIS — Z992 Dependence on renal dialysis: Secondary | ICD-10-CM | POA: Diagnosis not present

## 2021-04-19 DIAGNOSIS — N186 End stage renal disease: Secondary | ICD-10-CM | POA: Diagnosis not present

## 2021-04-20 ENCOUNTER — Encounter: Payer: Medicare Other | Admitting: Vascular Surgery

## 2021-04-21 DIAGNOSIS — Z992 Dependence on renal dialysis: Secondary | ICD-10-CM | POA: Diagnosis not present

## 2021-04-21 DIAGNOSIS — N186 End stage renal disease: Secondary | ICD-10-CM | POA: Diagnosis not present

## 2021-04-23 DIAGNOSIS — N186 End stage renal disease: Secondary | ICD-10-CM | POA: Diagnosis not present

## 2021-04-23 DIAGNOSIS — Z992 Dependence on renal dialysis: Secondary | ICD-10-CM | POA: Diagnosis not present

## 2021-04-26 DIAGNOSIS — Z992 Dependence on renal dialysis: Secondary | ICD-10-CM | POA: Diagnosis not present

## 2021-04-26 DIAGNOSIS — N186 End stage renal disease: Secondary | ICD-10-CM | POA: Diagnosis not present

## 2021-04-28 ENCOUNTER — Other Ambulatory Visit: Payer: Self-pay | Admitting: *Deleted

## 2021-04-28 DIAGNOSIS — N186 End stage renal disease: Secondary | ICD-10-CM | POA: Diagnosis not present

## 2021-04-28 DIAGNOSIS — Z992 Dependence on renal dialysis: Secondary | ICD-10-CM | POA: Diagnosis not present

## 2021-04-29 ENCOUNTER — Ambulatory Visit (INDEPENDENT_AMBULATORY_CARE_PROVIDER_SITE_OTHER): Payer: Medicare Other | Admitting: Podiatry

## 2021-04-29 ENCOUNTER — Other Ambulatory Visit: Payer: Self-pay

## 2021-04-29 DIAGNOSIS — M79674 Pain in right toe(s): Secondary | ICD-10-CM | POA: Diagnosis not present

## 2021-04-29 DIAGNOSIS — M86072 Acute hematogenous osteomyelitis, left ankle and foot: Secondary | ICD-10-CM

## 2021-04-29 DIAGNOSIS — Z7901 Long term (current) use of anticoagulants: Secondary | ICD-10-CM | POA: Diagnosis not present

## 2021-04-29 DIAGNOSIS — M79675 Pain in left toe(s): Secondary | ICD-10-CM | POA: Diagnosis not present

## 2021-04-29 DIAGNOSIS — B351 Tinea unguium: Secondary | ICD-10-CM | POA: Diagnosis not present

## 2021-04-29 DIAGNOSIS — I739 Peripheral vascular disease, unspecified: Secondary | ICD-10-CM

## 2021-04-29 MED ORDER — OXYCODONE HCL 5 MG PO TABS: 5.0000 mg | ORAL_TABLET | Freq: Four times a day (QID) | ORAL | 0 refills | Status: DC | PRN

## 2021-04-30 DIAGNOSIS — N186 End stage renal disease: Secondary | ICD-10-CM | POA: Diagnosis not present

## 2021-04-30 DIAGNOSIS — Z992 Dependence on renal dialysis: Secondary | ICD-10-CM | POA: Diagnosis not present

## 2021-05-03 DIAGNOSIS — Z992 Dependence on renal dialysis: Secondary | ICD-10-CM | POA: Diagnosis not present

## 2021-05-03 DIAGNOSIS — N186 End stage renal disease: Secondary | ICD-10-CM | POA: Diagnosis not present

## 2021-05-04 NOTE — Progress Notes (Signed)
Subjective: ?81 year old female presents the office today for postop visit #1 status post left partial first amputation.  She is in rehab.  Change the bandage weekly with Betadine.  She gets some occasional pain to the foot and her leg if she is asking for pain medication.  She also asked that the nails be trimmed today as are thickened elongated she cannot do them herself.  Denies any fevers, chills, nausea, vomiting.  No Pain, chest pain, shortness of breath. ? ?Objective: ?AAO x3, NAD ?Status post partial first amputation left foot with sutures intact without any evidence of dehiscence.  There is minimal edema.  No erythema or warmth.  There is no drainage or pus or any signs of infection.  Dry skin is present. ?Nails are hypertrophic, dystrophic, brittle, discolored, elongated ?10. No surrounding redness or drainage. Tenderness nails 1-5 on the right and 2 through 5 on the left. No open lesions or pre-ulcerative lesions are identified today. ?No pain with calf compression, swelling, warmth, erythema ? ? ? ? ? ? ?Assessment: ?Status post partial first amputation, healing well; PAD; Symptomatic onychomycosis ? ?Plan: ?-All treatment options discussed with the patient including all alternatives, risks, complications.  ?-Incisions healing well at this time postoperatively.  I cleaned the incision and as well as the skin.  Small mount of Betadine was applied followed by a dressing.  Recommended every other day dressing changes. ?-Given pain I did prescribe oxycodone '5mg'$  as she did well with this in the hospital ?-Sharply debrided the nails x10 without any complications or bleeding ?-She has follow-up scheduled with vascular surgery on May 11, 2021 ?-Finish course of antibiotics on cefepime ?-Patient encouraged to call the office with any questions, concerns, change in symptoms.  ? ?Return in about 1 week (around 05/06/2021). ? ?Trula Slade DPM ? ?

## 2021-05-05 DIAGNOSIS — N186 End stage renal disease: Secondary | ICD-10-CM | POA: Diagnosis not present

## 2021-05-05 DIAGNOSIS — Z992 Dependence on renal dialysis: Secondary | ICD-10-CM | POA: Diagnosis not present

## 2021-05-06 ENCOUNTER — Ambulatory Visit (INDEPENDENT_AMBULATORY_CARE_PROVIDER_SITE_OTHER): Payer: Medicare Other | Admitting: Podiatry

## 2021-05-06 ENCOUNTER — Other Ambulatory Visit: Payer: Self-pay

## 2021-05-06 ENCOUNTER — Ambulatory Visit (INDEPENDENT_AMBULATORY_CARE_PROVIDER_SITE_OTHER): Payer: Medicare Other

## 2021-05-06 DIAGNOSIS — Z9889 Other specified postprocedural states: Secondary | ICD-10-CM

## 2021-05-06 DIAGNOSIS — M86072 Acute hematogenous osteomyelitis, left ankle and foot: Secondary | ICD-10-CM

## 2021-05-06 DIAGNOSIS — I739 Peripheral vascular disease, unspecified: Secondary | ICD-10-CM

## 2021-05-07 DIAGNOSIS — N186 End stage renal disease: Secondary | ICD-10-CM | POA: Diagnosis not present

## 2021-05-07 DIAGNOSIS — Z992 Dependence on renal dialysis: Secondary | ICD-10-CM | POA: Diagnosis not present

## 2021-05-09 DIAGNOSIS — H524 Presbyopia: Secondary | ICD-10-CM | POA: Diagnosis not present

## 2021-05-09 NOTE — Progress Notes (Signed)
Subjective: ?81 year old female presents the office today for postop visit #1 status post left partial first amputation.  States that she is doing well.  Only occasionally does she get discomfort to her foot and leg but no current pain.  They have continue with dressing changes.  Denies any increase in swelling.  She has no fevers or chills that she reports that she has no other concerns today.  ? ?Objective: ?AAO x3, NAD ?Status post partial first amputation left foot with sutures intact without any evidence of dehiscence.  There is mild but improved edema.  No erythema or warmth.  There is no drainage or pus or any signs of infection.  Dry skin is present. ?No pain with calf compression, swelling, warmth, erythema ? ? ? ? ? ? ? ? ?Assessment: ?Status post partial first amputation, healing well; PAD; Symptomatic onychomycosis ? ?Plan: ?-All treatment options discussed with the patient including all alternatives, risks, complications.  ?-X-rays obtained reviewed including 3 views of the left foot..  Notes of acute fracture, osteomyelitis.  Vessel calcification is noted. ?-Incisions healing well at this time postoperatively.  I cleaned the incision and as well as the skin.  I left the sutures intact.  Xeroform was applied followed by dressing.  Continue dressing changes.  Continue to limit activity, weightbearing to the heel. ?-She has follow-up scheduled with vascular surgery on May 11, 2021 ?-Finish course of antibiotics on cefepime ?-Likely plan for suture removal next appointment ?-Patient encouraged to call the office with any questions, concerns, change in symptoms.  ? ?Trula Slade DPM ? ?

## 2021-05-10 DIAGNOSIS — I739 Peripheral vascular disease, unspecified: Secondary | ICD-10-CM | POA: Diagnosis not present

## 2021-05-10 DIAGNOSIS — R059 Cough, unspecified: Secondary | ICD-10-CM | POA: Diagnosis not present

## 2021-05-10 DIAGNOSIS — Z299 Encounter for prophylactic measures, unspecified: Secondary | ICD-10-CM | POA: Diagnosis not present

## 2021-05-10 DIAGNOSIS — J9611 Chronic respiratory failure with hypoxia: Secondary | ICD-10-CM | POA: Diagnosis not present

## 2021-05-10 DIAGNOSIS — Z992 Dependence on renal dialysis: Secondary | ICD-10-CM | POA: Diagnosis not present

## 2021-05-10 DIAGNOSIS — N186 End stage renal disease: Secondary | ICD-10-CM | POA: Diagnosis not present

## 2021-05-11 ENCOUNTER — Ambulatory Visit (INDEPENDENT_AMBULATORY_CARE_PROVIDER_SITE_OTHER): Payer: Medicare Other | Admitting: Vascular Surgery

## 2021-05-11 ENCOUNTER — Ambulatory Visit (HOSPITAL_COMMUNITY)
Admission: RE | Admit: 2021-05-11 | Discharge: 2021-05-11 | Disposition: A | Discharge status: Home / Self Care | Payer: Medicare Other | Source: Ambulatory Visit | Attending: Vascular Surgery | Admitting: Vascular Surgery

## 2021-05-11 ENCOUNTER — Ambulatory Visit (INDEPENDENT_AMBULATORY_CARE_PROVIDER_SITE_OTHER)
Admit: 2021-05-11 | Discharge: 2021-05-11 | Disposition: A | Discharge status: Home / Self Care | Payer: Medicare Other | Attending: Vascular Surgery | Admitting: Vascular Surgery

## 2021-05-11 ENCOUNTER — Encounter: Payer: Self-pay | Admitting: Vascular Surgery

## 2021-05-11 VITALS — BP 117/67 | HR 68 | Temp 98.4°F | Resp 20 | Ht 67.0 in | Wt 186.0 lb

## 2021-05-11 DIAGNOSIS — I739 Peripheral vascular disease, unspecified: Secondary | ICD-10-CM | POA: Diagnosis not present

## 2021-05-11 NOTE — Progress Notes (Signed)
? ?Patient ID: Jody [name], female   DOB: 05-29-1940, 81 y.o.   MRN: [id number] ? ?Reason for Consult: Routine Post Op ?  ?Referred by Monico Blitz, MD ? ?Subjective:  ?   ?HPI: ? ?Jody [name] is a 81 y.o. female history of left first toe amputation.  Prior to this she underwent SFA stenting.  She is now in a nursing facility.  She is not having any current issues.  She follows up today for left lower extremity evaluation with ABIs and duplex.  Unfortunately duplex cannot be performed due to patient positioning. ? ?Past Medical History:  ?Diagnosis Date  ? Anemia in chronic kidney disease (CODE) 06/05/2015  ? Arthritis   ? "bad in my legs" (12/07/2014)  ? Chronic atrial fibrillation ([place]) 06/05/2015  ? Chronic back pain   ? "dr said my spine is crooked"  ? Chronic bronchitis ([place])   ? "get it q yr" (12/07/2014)  ? CKD stage 4 due to type 2 diabetes mellitus ([place]) 06/05/2015  ? Complication of anesthesia   ? headache for 2 days after surgery in May 2019  ? COPD (chronic obstructive pulmonary disease) ([place])   ? Gait difficulty 09/02/2014  ? Hypercholesterolemia   ? Hypertension   ? Neuropathy 2015  ? in legs  ? NSVT (nonsustained ventricular tachycardia)   ? Sinus brady-tachy syndrome ([place])   ? Stroke [facility]) 05/2014  ? denies residual on 12/07/2014  ? Type II diabetes mellitus ([place])   ? ?Family History  ?Problem Relation Age of Onset  ? Cancer Other   ? Heart attack Brother   ? Cancer Sister   ?     breast  ? Alcohol abuse Brother   ? ?Past Surgical History:  ?Procedure Laterality Date  ? ABDOMINAL AORTOGRAM W/LOWER EXTREMITY Left 04/06/2021  ? Procedure: ABDOMINAL AORTOGRAM W/LOWER EXTREMITY;  Surgeon: Broadus John, MD;  Location: [place] [facility];  Service: Cardiovascular;  Laterality: Left;  ? ABDOMINAL HYSTERECTOMY  ~ 1970  ? AMPUTATION TOE Left 04/08/2021  ? Procedure: FIRST TOE LEFT FOOT AMPUTATION;  Surgeon: Trula Slade, DPM;  Location: [place];  Service: Podiatry;  Laterality: Left;  ? APPENDECTOMY   ~ 1970  ? AV FISTULA PLACEMENT Right 07/06/2017  ? Procedure: RIGHT RADIOCEPHALIC ARTERIOVENOUS FISTULA creation;  Surgeon: Conrad [place], MD;  Location: [place];  Service: Vascular;  Laterality: Right;  ? BASCILIC VEIN TRANSPOSITION Right 09/17/2017  ? Procedure: SECOND STAGE BASILIC VEIN TRANSPOSITION RIGHT ARM;  Surgeon: Rosetta Posner, MD;  Location: [place];  Service: Vascular;  Laterality: Right;  ? COLONOSCOPY N/A 10/28/2017  ? Procedure: COLONOSCOPY;  Surgeon: Rogene Houston, MD;  [facility];  Service: Endoscopy;  Laterality: N/A;  ? COLONOSCOPY WITH PROPOFOL N/A 12/29/2019  ? Procedure: COLONOSCOPY WITH PROPOFOL;  Surgeon: Eloise Harman, DO;  [facility];  Service: Endoscopy;  Laterality: N/A;  ? ESOPHAGOGASTRODUODENOSCOPY (EGD) WITH PROPOFOL N/A 10/31/2017  ? Procedure: ESOPHAGOGASTRODUODENOSCOPY (EGD) WITH PROPOFOL;  Surgeon: Rogene Houston, MD;  [facility];  Service: Endoscopy;  Laterality: N/A;  ? JOINT REPLACEMENT    ? PERIPHERAL VASCULAR BALLOON ANGIOPLASTY Left 04/06/2021  ? Procedure: PERIPHERAL VASCULAR BALLOON ANGIOPLASTY;  Surgeon: Broadus John, MD;  Location: [place] [facility];  Service: Cardiovascular;  Laterality: Left;  left sfa succesful ?[place] AT Unsuccesful  ? TOTAL KNEE ARTHROPLASTY Right ~ 1986  ? TUMOR EXCISION  ~ 1970  ? "9# in my stomach"  ? ? ?  Short Social History:  ?Social History  ? ?Tobacco Use  ? Smoking status: Former  ?  Packs/day: 0.50  ?  Years: 50.00  ?  Pack years: 25.00  ?  Types: Cigarettes  ?  Start date: 03/07/1953  ?  Quit date: 01/14/2004  ?  Years since quitting: 17.3  ? Smokeless tobacco: Never  ? Tobacco comments:  ?  "quit smoking cigarettes  in ~ 2005"  ?Substance Use Topics  ? Alcohol use: No  ?  Alcohol/week: 0.0 standard drinks  ?  Comment: 10/242016 "quit drinking beer in ~ 1977"  ? ? ?Allergies  ?Allergen Reactions  ? Codeine Nausea And Vomiting  ? ? ?Current Outpatient Medications  ?Medication Sig Dispense Refill  ?  acetaminophen (TYLENOL) 500 MG tablet Take 1 tablet (500 mg total) by mouth 3 (three) times daily. 30 tablet 0  ? albuterol (ACCUNEB) 0.63 MG/3ML nebulizer solution Take 1 ampule by nebulization every 6 (six) hours as needed for wheezing.    ? albuterol (PROVENTIL HFA;VENTOLIN HFA) 108 (90 BASE) MCG/ACT inhaler Inhale 1-2 puffs into the lungs every 6 (six) hours as needed for wheezing or shortness of breath. Reported on 03/01/2015    ? apixaban (ELIQUIS) 2.5 MG TABS tablet Take 2.5 mg by mouth 2 (two) times daily.    ? aspirin EC 81 MG EC tablet Take 1 tablet (81 mg total) by mouth daily. Swallow whole. 30 tablet 11  ? atorvastatin (LIPITOR) 40 MG tablet Take 1 tablet (40 mg total) by mouth daily. 30 tablet 0  ? ceFEPIme 2 g in sodium chloride 0.9 % 100 mL Inject 2 g into the vein every Tuesday, Thursday, and Saturday at 6 PM. 2 g 0  ? ferrous sulfate 325 (65 FE) MG tablet Take 325 mg by mouth 2 (two) times daily with a meal.    ? gabapentin (NEURONTIN) 100 MG capsule Take 1 capsule (100 mg total) by mouth at bedtime. 30 capsule 0  ? metoprolol tartrate (LOPRESSOR) 25 MG tablet Take 0.5 tablets (12.5 mg total) by mouth 2 (two) times daily. 30 tablet 0  ? oxyCODONE (ROXICODONE) 5 MG immediate release tablet Take 1 tablet (5 mg total) by mouth every 6 (six) hours as needed for severe pain. 10 tablet 0  ? pramipexole (MIRAPEX) 0.125 MG tablet Take 0.125 mg by mouth daily.    ? sevelamer carbonate (RENVELA) 800 MG tablet Take 800 mg by mouth 3 (three) times daily with meals.     ? vitamin B-12 100 MCG tablet Take 1 tablet (100 mcg total) by mouth daily. 30 tablet 0  ? ?No current facility-administered medications for this visit.  ? ? ?Review of Systems  ?Constitutional:  Constitutional negative. ?HENT: HENT negative.  ?Eyes: Eyes negative.  ?Respiratory: Respiratory negative.  ?Cardiovascular: Cardiovascular negative.  ?GI: Gastrointestinal negative.  ?Skin: Positive for wound.  ?Neurological: Neurological  negative. ?Hematologic: Hematologic/lymphatic negative.  ?Psychiatric: Psychiatric negative.   ? ?   ?Objective:  ?Objective  ? ?Vitals:  ? 05/11/21 1547  ?BP: 117/67  ?Pulse: 68  ?Resp: 20  ?Temp: 98.4 ?F (36.9 ?C)  ?SpO2: 94%  ?Weight: 186 lb (84.4 kg)  ?Height: '5\' 7"'$  (1.702 m)  ? ?Body mass index is 29.13 kg/m?. ? ?Physical Exam ?Constitutional:   ?   Comments: In wheelchair  ?HENT:  ?   Head: Normocephalic.  ?   Nose: Nose normal.  ?Eyes:  ?   Pupils: Pupils are equal, round, and reactive to light.  ?Cardiovascular:  ?  Comments: There is a left peroneal signal and I can trace this on an anterior tibial signal on the foot to the dorsalis pedis. ?Abdominal:  ?   General: Abdomen is flat.  ?Skin: ?   Capillary Refill: Capillary refill takes less than 2 seconds.  ?   Comments: Left foot wound with dressing cdi  ?Neurological:  ?   General: No focal deficit present.  ?   Mental Status: She is alert.  ?Psychiatric:     ?   Mood and Affect: Mood normal.     ?   Thought Content: Thought content normal.  ? ? ?Data: ?ABI Findings:  ?+---------+------------------+-----+----------+--------+  ?Right    Rt Pressure (mmHg)IndexWaveform  Comment   ?+---------+------------------+-----+----------+--------+  ?Brachial                                  AVF       ?+---------+------------------+-----+----------+--------+  ?PTA      254               1.97 monophasic          ?+---------+------------------+-----+----------+--------+  ?DP       254               1.97 monophasic          ?+---------+------------------+-----+----------+--------+  ?Great Toe79                0.61 Abnormal            ?+---------+------------------+-----+----------+--------+  ? ?+---------+------------------+-----+-------------------+----------------+  ?Left     Lt Pressure (mmHg)IndexWaveform           Comment           ?+---------+------------------+-----+-------------------+----------------+  ?Brachial 129                                                          ?+---------+------------------+-----+-------------------+----------------+  ?ATA      254               1.97 dampened monophasic                  ?+---------+------------------+-----+-------------------+------

## 2021-05-12 DIAGNOSIS — Z992 Dependence on renal dialysis: Secondary | ICD-10-CM | POA: Diagnosis not present

## 2021-05-12 DIAGNOSIS — N186 End stage renal disease: Secondary | ICD-10-CM | POA: Diagnosis not present

## 2021-05-13 ENCOUNTER — Telehealth: Payer: Self-pay | Admitting: *Deleted

## 2021-05-13 DIAGNOSIS — N186 End stage renal disease: Secondary | ICD-10-CM | POA: Diagnosis not present

## 2021-05-13 DIAGNOSIS — Z992 Dependence on renal dialysis: Secondary | ICD-10-CM | POA: Diagnosis not present

## 2021-05-13 NOTE — Telephone Encounter (Signed)
Jody [name] w/ [facility] is calling to let the physician know that the top of incision is discolored, no drainage, no odor. The director of nursing is aware of the situation and patient has an upcoming appointment on Monday April 3rd@ 10:45. ?

## 2021-05-14 DIAGNOSIS — N186 End stage renal disease: Secondary | ICD-10-CM | POA: Diagnosis not present

## 2021-05-14 DIAGNOSIS — N250 Renal osteodystrophy: Secondary | ICD-10-CM | POA: Diagnosis not present

## 2021-05-14 DIAGNOSIS — N2581 Secondary hyperparathyroidism of renal origin: Secondary | ICD-10-CM | POA: Diagnosis not present

## 2021-05-14 DIAGNOSIS — E559 Vitamin D deficiency, unspecified: Secondary | ICD-10-CM | POA: Diagnosis not present

## 2021-05-14 DIAGNOSIS — Z23 Encounter for immunization: Secondary | ICD-10-CM | POA: Diagnosis not present

## 2021-05-14 DIAGNOSIS — Z992 Dependence on renal dialysis: Secondary | ICD-10-CM | POA: Diagnosis not present

## 2021-05-16 ENCOUNTER — Ambulatory Visit (INDEPENDENT_AMBULATORY_CARE_PROVIDER_SITE_OTHER): Payer: Medicare Other

## 2021-05-16 ENCOUNTER — Ambulatory Visit (INDEPENDENT_AMBULATORY_CARE_PROVIDER_SITE_OTHER): Payer: Medicare Other | Admitting: Podiatry

## 2021-05-16 DIAGNOSIS — Z9889 Other specified postprocedural states: Secondary | ICD-10-CM

## 2021-05-16 DIAGNOSIS — R2689 Other abnormalities of gait and mobility: Secondary | ICD-10-CM | POA: Diagnosis not present

## 2021-05-16 DIAGNOSIS — M86071 Acute hematogenous osteomyelitis, right ankle and foot: Secondary | ICD-10-CM

## 2021-05-16 DIAGNOSIS — R1310 Dysphagia, unspecified: Secondary | ICD-10-CM | POA: Diagnosis not present

## 2021-05-16 DIAGNOSIS — M86172 Other acute osteomyelitis, left ankle and foot: Secondary | ICD-10-CM | POA: Diagnosis not present

## 2021-05-16 DIAGNOSIS — I739 Peripheral vascular disease, unspecified: Secondary | ICD-10-CM

## 2021-05-16 DIAGNOSIS — M6281 Muscle weakness (generalized): Secondary | ICD-10-CM | POA: Diagnosis not present

## 2021-05-17 DIAGNOSIS — M6281 Muscle weakness (generalized): Secondary | ICD-10-CM | POA: Diagnosis not present

## 2021-05-17 DIAGNOSIS — R1310 Dysphagia, unspecified: Secondary | ICD-10-CM | POA: Diagnosis not present

## 2021-05-17 DIAGNOSIS — E559 Vitamin D deficiency, unspecified: Secondary | ICD-10-CM | POA: Diagnosis not present

## 2021-05-17 DIAGNOSIS — N2581 Secondary hyperparathyroidism of renal origin: Secondary | ICD-10-CM | POA: Diagnosis not present

## 2021-05-17 DIAGNOSIS — N250 Renal osteodystrophy: Secondary | ICD-10-CM | POA: Diagnosis not present

## 2021-05-17 DIAGNOSIS — N186 End stage renal disease: Secondary | ICD-10-CM | POA: Diagnosis not present

## 2021-05-17 DIAGNOSIS — Z23 Encounter for immunization: Secondary | ICD-10-CM | POA: Diagnosis not present

## 2021-05-17 DIAGNOSIS — M86172 Other acute osteomyelitis, left ankle and foot: Secondary | ICD-10-CM | POA: Diagnosis not present

## 2021-05-17 DIAGNOSIS — R2689 Other abnormalities of gait and mobility: Secondary | ICD-10-CM | POA: Diagnosis not present

## 2021-05-17 DIAGNOSIS — Z992 Dependence on renal dialysis: Secondary | ICD-10-CM | POA: Diagnosis not present

## 2021-05-19 DIAGNOSIS — Z992 Dependence on renal dialysis: Secondary | ICD-10-CM | POA: Diagnosis not present

## 2021-05-19 DIAGNOSIS — N186 End stage renal disease: Secondary | ICD-10-CM | POA: Diagnosis not present

## 2021-05-19 DIAGNOSIS — N250 Renal osteodystrophy: Secondary | ICD-10-CM | POA: Diagnosis not present

## 2021-05-19 DIAGNOSIS — E559 Vitamin D deficiency, unspecified: Secondary | ICD-10-CM | POA: Diagnosis not present

## 2021-05-19 DIAGNOSIS — Z23 Encounter for immunization: Secondary | ICD-10-CM | POA: Diagnosis not present

## 2021-05-19 DIAGNOSIS — N2581 Secondary hyperparathyroidism of renal origin: Secondary | ICD-10-CM | POA: Diagnosis not present

## 2021-05-21 DIAGNOSIS — N250 Renal osteodystrophy: Secondary | ICD-10-CM | POA: Diagnosis not present

## 2021-05-21 DIAGNOSIS — Z23 Encounter for immunization: Secondary | ICD-10-CM | POA: Diagnosis not present

## 2021-05-21 DIAGNOSIS — Z992 Dependence on renal dialysis: Secondary | ICD-10-CM | POA: Diagnosis not present

## 2021-05-21 DIAGNOSIS — N2581 Secondary hyperparathyroidism of renal origin: Secondary | ICD-10-CM | POA: Diagnosis not present

## 2021-05-21 DIAGNOSIS — E559 Vitamin D deficiency, unspecified: Secondary | ICD-10-CM | POA: Diagnosis not present

## 2021-05-21 DIAGNOSIS — N186 End stage renal disease: Secondary | ICD-10-CM | POA: Diagnosis not present

## 2021-05-23 ENCOUNTER — Ambulatory Visit (INDEPENDENT_AMBULATORY_CARE_PROVIDER_SITE_OTHER): Payer: Medicare Other | Admitting: Podiatry

## 2021-05-23 DIAGNOSIS — M86071 Acute hematogenous osteomyelitis, right ankle and foot: Secondary | ICD-10-CM

## 2021-05-23 DIAGNOSIS — I739 Peripheral vascular disease, unspecified: Secondary | ICD-10-CM | POA: Insufficient documentation

## 2021-05-23 NOTE — Patient Instructions (Signed)
Can wash foot with soap and water. Dry well. Apply a small amount of betadine over the incision followed by a dry dressing. Can moisturize the foot, just not on the incision site.  ?

## 2021-05-23 NOTE — Progress Notes (Signed)
Subjective: ?81 year old female presents the office today for follow-up evaluation after undergoing partial first ray amputation on the left side.  She is not having significant pain to the foot but they have noticed some bleeding and drainage from the incision.  Not sure if she is still on antibiotics.  Denies any fevers or chills.  She has no other concerns today. ? ?Objective: ?AAO x3, NAD ?Status post partial first amputation left foot with sutures intact without any evidence of dehiscence there is macerated tissue present on the distal portion incision but not able to identify any drainage or pus.  There is no fluctuation or crepitation.  There is mild edema.  There is no erythema or warmth. ?No pain with calf compression, swelling, warmth, erythema ? ? ? ? ? ? ? ? ?Assessment: ?Status post partial first amputation, PAD ? ?Plan: ?-All treatment options discussed with the patient including all alternatives, risks, complications.  ?-X-rays were obtained and reviewed with the left foot.  3 views of the left foot were obtained.  Status post partial partial amputation.  No evidence of acute osteomyelitis.  Vessel calcifications present. ?-Out the sutures intact today.  Not able to identify any drainage.  Continue Betadine dressing changes daily.  I did write on her order sheet today to continue with the cefepime to make sure she is still getting this after dialysis.  Continue to elevate and limited weightbearing to the heel as tolerated. She still needs to monitor this very closely for signs or symptoms of worsening infection.  Should anything progress to return to the emergency department. ? ? ?Trula Slade DPM ? ?

## 2021-05-24 DIAGNOSIS — N186 End stage renal disease: Secondary | ICD-10-CM | POA: Diagnosis not present

## 2021-05-24 DIAGNOSIS — Z992 Dependence on renal dialysis: Secondary | ICD-10-CM | POA: Diagnosis not present

## 2021-05-24 DIAGNOSIS — N250 Renal osteodystrophy: Secondary | ICD-10-CM | POA: Diagnosis not present

## 2021-05-24 DIAGNOSIS — E559 Vitamin D deficiency, unspecified: Secondary | ICD-10-CM | POA: Diagnosis not present

## 2021-05-24 DIAGNOSIS — Z23 Encounter for immunization: Secondary | ICD-10-CM | POA: Diagnosis not present

## 2021-05-24 DIAGNOSIS — N2581 Secondary hyperparathyroidism of renal origin: Secondary | ICD-10-CM | POA: Diagnosis not present

## 2021-05-25 ENCOUNTER — Other Ambulatory Visit: Payer: Self-pay | Admitting: *Deleted

## 2021-05-25 DIAGNOSIS — I739 Peripheral vascular disease, unspecified: Secondary | ICD-10-CM

## 2021-05-25 NOTE — Progress Notes (Signed)
Subjective: ?81 year old female presents the office today for follow-up evaluation after undergoing partial first ray amputation on the left side.  She presents today for possible suture removal.  She states that she is not having much pain to the foot.  The dressing does get changed regularly she reports.  She was on antibiotics at dialysis she reports.  Denies any fevers or chills.  She has no other concerns today.  ? ?Objective: ?AAO x3, NAD ?Status post partial first amputation left foot with sutures intact without any evidence of dehiscence.  The macerated tissue that was present previously distally has resolved.  I am not able to identify any drainage or pus today.  There are some scabbing present along the incision.  There is no surrounding erythema, ascending cellulitis.  No fluctuance or crepitation.  There is no malodor. ?No pain with calf compression, swelling, warmth, erythema ? ?Assessment: ?Status post partial first amputation, PAD ? ?Plan: ?-All treatment options discussed with the patient including all alternatives, risks, complications.  ?-They remove the sutures without any complications incisions well coapted.  Steri-Strips were applied for reinforcement followed by Betadine and a dressing.  Continue with the same dressing changes for now.  Continue with surgical shoe, offloading and weight-bear to the heel. ?-Monitor for any clinical signs or symptoms of infection and directed to call the office immediately should any occur or go to the ER. ? ?Trula Slade DPM ?

## 2021-05-26 DIAGNOSIS — N186 End stage renal disease: Secondary | ICD-10-CM | POA: Diagnosis not present

## 2021-05-26 DIAGNOSIS — Z23 Encounter for immunization: Secondary | ICD-10-CM | POA: Diagnosis not present

## 2021-05-26 DIAGNOSIS — N250 Renal osteodystrophy: Secondary | ICD-10-CM | POA: Diagnosis not present

## 2021-05-26 DIAGNOSIS — Z992 Dependence on renal dialysis: Secondary | ICD-10-CM | POA: Diagnosis not present

## 2021-05-26 DIAGNOSIS — E559 Vitamin D deficiency, unspecified: Secondary | ICD-10-CM | POA: Diagnosis not present

## 2021-05-26 DIAGNOSIS — N2581 Secondary hyperparathyroidism of renal origin: Secondary | ICD-10-CM | POA: Diagnosis not present

## 2021-05-28 DIAGNOSIS — R6889 Other general symptoms and signs: Secondary | ICD-10-CM | POA: Diagnosis not present

## 2021-05-28 DIAGNOSIS — N250 Renal osteodystrophy: Secondary | ICD-10-CM | POA: Diagnosis not present

## 2021-05-28 DIAGNOSIS — E559 Vitamin D deficiency, unspecified: Secondary | ICD-10-CM | POA: Diagnosis not present

## 2021-05-28 DIAGNOSIS — I5032 Chronic diastolic (congestive) heart failure: Secondary | ICD-10-CM | POA: Diagnosis not present

## 2021-05-28 DIAGNOSIS — N186 End stage renal disease: Secondary | ICD-10-CM | POA: Diagnosis not present

## 2021-05-28 DIAGNOSIS — M6281 Muscle weakness (generalized): Secondary | ICD-10-CM | POA: Diagnosis not present

## 2021-05-28 DIAGNOSIS — Z992 Dependence on renal dialysis: Secondary | ICD-10-CM | POA: Diagnosis not present

## 2021-05-28 DIAGNOSIS — J9601 Acute respiratory failure with hypoxia: Secondary | ICD-10-CM | POA: Diagnosis not present

## 2021-05-28 DIAGNOSIS — N2581 Secondary hyperparathyroidism of renal origin: Secondary | ICD-10-CM | POA: Diagnosis not present

## 2021-05-28 DIAGNOSIS — Z23 Encounter for immunization: Secondary | ICD-10-CM | POA: Diagnosis not present

## 2021-05-31 ENCOUNTER — Other Ambulatory Visit: Payer: Self-pay

## 2021-05-31 ENCOUNTER — Inpatient Hospital Stay (HOSPITAL_COMMUNITY)
Admission: EM | Admit: 2021-05-31 | Discharge: 2021-06-07 | DRG: 871 | Disposition: A | Discharge status: Skilled Nursing Facility | Payer: Medicare Other | Source: Skilled Nursing Facility | Attending: Internal Medicine | Admitting: Internal Medicine

## 2021-05-31 ENCOUNTER — Encounter (HOSPITAL_COMMUNITY): Payer: Self-pay | Admitting: Emergency Medicine

## 2021-05-31 ENCOUNTER — Emergency Department (HOSPITAL_COMMUNITY): Payer: Medicare Other

## 2021-05-31 DIAGNOSIS — Z515 Encounter for palliative care: Secondary | ICD-10-CM | POA: Diagnosis not present

## 2021-05-31 DIAGNOSIS — I739 Peripheral vascular disease, unspecified: Secondary | ICD-10-CM | POA: Diagnosis not present

## 2021-05-31 DIAGNOSIS — R079 Chest pain, unspecified: Secondary | ICD-10-CM | POA: Diagnosis not present

## 2021-05-31 DIAGNOSIS — R012 Other cardiac sounds: Secondary | ICD-10-CM | POA: Diagnosis not present

## 2021-05-31 DIAGNOSIS — E7800 Pure hypercholesterolemia, unspecified: Secondary | ICD-10-CM | POA: Diagnosis present

## 2021-05-31 DIAGNOSIS — I10 Essential (primary) hypertension: Secondary | ICD-10-CM | POA: Diagnosis not present

## 2021-05-31 DIAGNOSIS — I4510 Unspecified right bundle-branch block: Secondary | ICD-10-CM | POA: Diagnosis not present

## 2021-05-31 DIAGNOSIS — I120 Hypertensive chronic kidney disease with stage 5 chronic kidney disease or end stage renal disease: Secondary | ICD-10-CM | POA: Diagnosis not present

## 2021-05-31 DIAGNOSIS — R6521 Severe sepsis with septic shock: Secondary | ICD-10-CM | POA: Diagnosis not present

## 2021-05-31 DIAGNOSIS — J449 Chronic obstructive pulmonary disease, unspecified: Secondary | ICD-10-CM

## 2021-05-31 DIAGNOSIS — J9621 Acute and chronic respiratory failure with hypoxia: Secondary | ICD-10-CM | POA: Diagnosis present

## 2021-05-31 DIAGNOSIS — E1151 Type 2 diabetes mellitus with diabetic peripheral angiopathy without gangrene: Secondary | ICD-10-CM | POA: Diagnosis not present

## 2021-05-31 DIAGNOSIS — R262 Difficulty in walking, not elsewhere classified: Secondary | ICD-10-CM | POA: Diagnosis present

## 2021-05-31 DIAGNOSIS — D631 Anemia in chronic kidney disease: Secondary | ICD-10-CM | POA: Diagnosis not present

## 2021-05-31 DIAGNOSIS — Z89422 Acquired absence of other left toe(s): Secondary | ICD-10-CM

## 2021-05-31 DIAGNOSIS — Z79899 Other long term (current) drug therapy: Secondary | ICD-10-CM

## 2021-05-31 DIAGNOSIS — E873 Alkalosis: Secondary | ICD-10-CM | POA: Diagnosis not present

## 2021-05-31 DIAGNOSIS — E1169 Type 2 diabetes mellitus with other specified complication: Secondary | ICD-10-CM | POA: Diagnosis present

## 2021-05-31 DIAGNOSIS — A419 Sepsis, unspecified organism: Principal | ICD-10-CM | POA: Diagnosis present

## 2021-05-31 DIAGNOSIS — J440 Chronic obstructive pulmonary disease with acute lower respiratory infection: Secondary | ICD-10-CM | POA: Diagnosis not present

## 2021-05-31 DIAGNOSIS — N186 End stage renal disease: Secondary | ICD-10-CM | POA: Diagnosis not present

## 2021-05-31 DIAGNOSIS — J9602 Acute respiratory failure with hypercapnia: Secondary | ICD-10-CM | POA: Diagnosis not present

## 2021-05-31 DIAGNOSIS — Z885 Allergy status to narcotic agent status: Secondary | ICD-10-CM

## 2021-05-31 DIAGNOSIS — N2581 Secondary hyperparathyroidism of renal origin: Secondary | ICD-10-CM | POA: Diagnosis present

## 2021-05-31 DIAGNOSIS — R4020 Unspecified coma: Secondary | ICD-10-CM | POA: Diagnosis not present

## 2021-05-31 DIAGNOSIS — D696 Thrombocytopenia, unspecified: Secondary | ICD-10-CM | POA: Diagnosis present

## 2021-05-31 DIAGNOSIS — Z7982 Long term (current) use of aspirin: Secondary | ICD-10-CM

## 2021-05-31 DIAGNOSIS — N189 Chronic kidney disease, unspecified: Secondary | ICD-10-CM | POA: Diagnosis not present

## 2021-05-31 DIAGNOSIS — I248 Other forms of acute ischemic heart disease: Secondary | ICD-10-CM | POA: Diagnosis not present

## 2021-05-31 DIAGNOSIS — I517 Cardiomegaly: Secondary | ICD-10-CM | POA: Diagnosis not present

## 2021-05-31 DIAGNOSIS — R627 Adult failure to thrive: Secondary | ICD-10-CM | POA: Diagnosis not present

## 2021-05-31 DIAGNOSIS — I5042 Chronic combined systolic (congestive) and diastolic (congestive) heart failure: Secondary | ICD-10-CM | POA: Diagnosis not present

## 2021-05-31 DIAGNOSIS — M86172 Other acute osteomyelitis, left ankle and foot: Secondary | ICD-10-CM | POA: Diagnosis present

## 2021-05-31 DIAGNOSIS — Z20822 Contact with and (suspected) exposure to covid-19: Secondary | ICD-10-CM | POA: Diagnosis present

## 2021-05-31 DIAGNOSIS — I482 Chronic atrial fibrillation, unspecified: Secondary | ICD-10-CM | POA: Diagnosis not present

## 2021-05-31 DIAGNOSIS — N185 Chronic kidney disease, stage 5: Secondary | ICD-10-CM

## 2021-05-31 DIAGNOSIS — E1142 Type 2 diabetes mellitus with diabetic polyneuropathy: Secondary | ICD-10-CM | POA: Diagnosis present

## 2021-05-31 DIAGNOSIS — R0902 Hypoxemia: Secondary | ICD-10-CM | POA: Diagnosis not present

## 2021-05-31 DIAGNOSIS — L89890 Pressure ulcer of other site, unstageable: Secondary | ICD-10-CM | POA: Diagnosis present

## 2021-05-31 DIAGNOSIS — Z992 Dependence on renal dialysis: Secondary | ICD-10-CM | POA: Diagnosis not present

## 2021-05-31 DIAGNOSIS — Z7189 Other specified counseling: Secondary | ICD-10-CM | POA: Diagnosis not present

## 2021-05-31 DIAGNOSIS — J181 Lobar pneumonia, unspecified organism: Secondary | ICD-10-CM | POA: Diagnosis present

## 2021-05-31 DIAGNOSIS — G8929 Other chronic pain: Secondary | ICD-10-CM | POA: Diagnosis present

## 2021-05-31 DIAGNOSIS — I4819 Other persistent atrial fibrillation: Secondary | ICD-10-CM | POA: Diagnosis not present

## 2021-05-31 DIAGNOSIS — R778 Other specified abnormalities of plasma proteins: Secondary | ICD-10-CM | POA: Diagnosis not present

## 2021-05-31 DIAGNOSIS — I132 Hypertensive heart and chronic kidney disease with heart failure and with stage 5 chronic kidney disease, or end stage renal disease: Secondary | ICD-10-CM | POA: Diagnosis present

## 2021-05-31 DIAGNOSIS — I5032 Chronic diastolic (congestive) heart failure: Secondary | ICD-10-CM

## 2021-05-31 DIAGNOSIS — R4189 Other symptoms and signs involving cognitive functions and awareness: Secondary | ICD-10-CM | POA: Diagnosis not present

## 2021-05-31 DIAGNOSIS — Z9981 Dependence on supplemental oxygen: Secondary | ICD-10-CM

## 2021-05-31 DIAGNOSIS — J9622 Acute and chronic respiratory failure with hypercapnia: Secondary | ICD-10-CM | POA: Diagnosis present

## 2021-05-31 DIAGNOSIS — Z87891 Personal history of nicotine dependence: Secondary | ICD-10-CM

## 2021-05-31 DIAGNOSIS — D509 Iron deficiency anemia, unspecified: Secondary | ICD-10-CM | POA: Diagnosis present

## 2021-05-31 DIAGNOSIS — Z8673 Personal history of transient ischemic attack (TIA), and cerebral infarction without residual deficits: Secondary | ICD-10-CM

## 2021-05-31 DIAGNOSIS — R404 Transient alteration of awareness: Secondary | ICD-10-CM | POA: Diagnosis not present

## 2021-05-31 DIAGNOSIS — L89611 Pressure ulcer of right heel, stage 1: Secondary | ICD-10-CM | POA: Diagnosis present

## 2021-05-31 DIAGNOSIS — Z743 Need for continuous supervision: Secondary | ICD-10-CM | POA: Diagnosis not present

## 2021-05-31 DIAGNOSIS — I5043 Acute on chronic combined systolic (congestive) and diastolic (congestive) heart failure: Secondary | ICD-10-CM

## 2021-05-31 DIAGNOSIS — Z96651 Presence of right artificial knee joint: Secondary | ICD-10-CM | POA: Diagnosis present

## 2021-05-31 DIAGNOSIS — Z8249 Family history of ischemic heart disease and other diseases of the circulatory system: Secondary | ICD-10-CM

## 2021-05-31 DIAGNOSIS — N250 Renal osteodystrophy: Secondary | ICD-10-CM | POA: Diagnosis not present

## 2021-05-31 DIAGNOSIS — J90 Pleural effusion, not elsewhere classified: Secondary | ICD-10-CM | POA: Diagnosis not present

## 2021-05-31 DIAGNOSIS — E1122 Type 2 diabetes mellitus with diabetic chronic kidney disease: Secondary | ICD-10-CM | POA: Diagnosis present

## 2021-05-31 DIAGNOSIS — J441 Chronic obstructive pulmonary disease with (acute) exacerbation: Secondary | ICD-10-CM | POA: Diagnosis present

## 2021-05-31 DIAGNOSIS — J9620 Acute and chronic respiratory failure, unspecified whether with hypoxia or hypercapnia: Secondary | ICD-10-CM | POA: Diagnosis not present

## 2021-05-31 DIAGNOSIS — J438 Other emphysema: Secondary | ICD-10-CM | POA: Diagnosis not present

## 2021-05-31 DIAGNOSIS — Z7901 Long term (current) use of anticoagulants: Secondary | ICD-10-CM | POA: Diagnosis not present

## 2021-05-31 DIAGNOSIS — I5033 Acute on chronic diastolic (congestive) heart failure: Secondary | ICD-10-CM

## 2021-05-31 DIAGNOSIS — M898X9 Other specified disorders of bone, unspecified site: Secondary | ICD-10-CM | POA: Diagnosis present

## 2021-05-31 DIAGNOSIS — R6520 Severe sepsis without septic shock: Secondary | ICD-10-CM | POA: Diagnosis not present

## 2021-05-31 DIAGNOSIS — E11621 Type 2 diabetes mellitus with foot ulcer: Secondary | ICD-10-CM | POA: Diagnosis present

## 2021-05-31 LAB — CBC WITH DIFFERENTIAL/PLATELET
Abs Immature Granulocytes: 0.03 10*3/uL (ref 0.00–0.07)
Basophils Absolute: 0 10*3/uL (ref 0.0–0.1)
Basophils Relative: 0 %
Eosinophils Absolute: 0.1 10*3/uL (ref 0.0–0.5)
Eosinophils Relative: 2 %
HCT: 30.5 % — ABNORMAL LOW (ref 36.0–46.0)
Hemoglobin: 9.1 g/dL — ABNORMAL LOW (ref 12.0–15.0)
Immature Granulocytes: 1 %
Lymphocytes Relative: 13 %
Lymphs Abs: 0.7 10*3/uL (ref 0.7–4.0)
MCH: 33.7 pg (ref 26.0–34.0)
MCHC: 29.8 g/dL — ABNORMAL LOW (ref 30.0–36.0)
MCV: 113 fL — ABNORMAL HIGH (ref 80.0–100.0)
Monocytes Absolute: 0.3 10*3/uL (ref 0.1–1.0)
Monocytes Relative: 6 %
Neutro Abs: 4 10*3/uL (ref 1.7–7.7)
Neutrophils Relative %: 78 %
Platelets: 111 10*3/uL — ABNORMAL LOW (ref 150–400)
RBC: 2.7 MIL/uL — ABNORMAL LOW (ref 3.87–5.11)
RDW: 16.6 % — ABNORMAL HIGH (ref 11.5–15.5)
WBC: 5.1 10*3/uL (ref 4.0–10.5)
nRBC: 0.8 % — ABNORMAL HIGH (ref 0.0–0.2)

## 2021-05-31 LAB — HEPATITIS B SURFACE ANTIBODY,QUALITATIVE: Hep B S Ab: REACTIVE — AB

## 2021-05-31 LAB — PROTIME-INR
INR: 1.6 — ABNORMAL HIGH (ref 0.8–1.2)
Prothrombin Time: 18.5 seconds — ABNORMAL HIGH (ref 11.4–15.2)

## 2021-05-31 LAB — RENAL FUNCTION PANEL
Albumin: 3.2 g/dL — ABNORMAL LOW (ref 3.5–5.0)
Anion gap: 9 (ref 5–15)
BUN: 54 mg/dL — ABNORMAL HIGH (ref 8–23)
CO2: 29 mmol/L (ref 22–32)
Calcium: 9.4 mg/dL (ref 8.9–10.3)
Chloride: 95 mmol/L — ABNORMAL LOW (ref 98–111)
Creatinine, Ser: 4.74 mg/dL — ABNORMAL HIGH (ref 0.44–1.00)
GFR, Estimated: 9 mL/min — ABNORMAL LOW (ref >60)
Glucose, Bld: 105 mg/dL — ABNORMAL HIGH (ref 70–99)
Phosphorus: 5.5 mg/dL — ABNORMAL HIGH (ref 2.5–4.6)
Potassium: 4.2 mmol/L (ref 3.5–5.1)
Sodium: 133 mmol/L — ABNORMAL LOW (ref 135–145)

## 2021-05-31 LAB — BLOOD GAS, VENOUS
Acid-Base Excess: 2.6 mmol/L — ABNORMAL HIGH (ref 0.0–2.0)
Bicarbonate: 31.4 mmol/L — ABNORMAL HIGH (ref 20.0–28.0)
Drawn by: 61882
FIO2: 100 %
O2 Saturation: 63.6 %
Patient temperature: 36.5
pCO2, Ven: 73 mmHg (ref 44–60)
pH, Ven: 7.24 — ABNORMAL LOW (ref 7.25–7.43)
pO2, Ven: 41 mmHg (ref 32–45)

## 2021-05-31 LAB — COMPREHENSIVE METABOLIC PANEL
ALT: 15 U/L (ref 0–44)
AST: 20 U/L (ref 15–41)
Albumin: 3.7 g/dL (ref 3.5–5.0)
Alkaline Phosphatase: 103 U/L (ref 38–126)
Anion gap: 11 (ref 5–15)
BUN: 50 mg/dL — ABNORMAL HIGH (ref 8–23)
CO2: 28 mmol/L (ref 22–32)
Calcium: 9.9 mg/dL (ref 8.9–10.3)
Chloride: 96 mmol/L — ABNORMAL LOW (ref 98–111)
Creatinine, Ser: 4.42 mg/dL — ABNORMAL HIGH (ref 0.44–1.00)
GFR, Estimated: 9 mL/min — ABNORMAL LOW (ref >60)
Glucose, Bld: 106 mg/dL — ABNORMAL HIGH (ref 70–99)
Potassium: 4.2 mmol/L (ref 3.5–5.1)
Sodium: 135 mmol/L (ref 135–145)
Total Bilirubin: 0.5 mg/dL (ref 0.3–1.2)
Total Protein: 7.3 g/dL (ref 6.5–8.1)

## 2021-05-31 LAB — AMMONIA: Ammonia: 24 umol/L (ref 9–35)

## 2021-05-31 LAB — BLOOD GAS, ARTERIAL
Acid-Base Excess: 2.2 mmol/L — ABNORMAL HIGH (ref 0.0–2.0)
Bicarbonate: 31.1 mmol/L — ABNORMAL HIGH (ref 20.0–28.0)
Drawn by: 27733
FIO2: 100 %
O2 Saturation: 98.6 %
Patient temperature: 36.5
pCO2 arterial: 74 mmHg (ref 32–48)
pH, Arterial: 7.23 — ABNORMAL LOW (ref 7.35–7.45)
pO2, Arterial: 337 mmHg — ABNORMAL HIGH (ref 83–108)

## 2021-05-31 LAB — CBC
HCT: 29.6 % — ABNORMAL LOW (ref 36.0–46.0)
Hemoglobin: 8.8 g/dL — ABNORMAL LOW (ref 12.0–15.0)
MCH: 33.7 pg (ref 26.0–34.0)
MCHC: 29.7 g/dL — ABNORMAL LOW (ref 30.0–36.0)
MCV: 113.4 fL — ABNORMAL HIGH (ref 80.0–100.0)
Platelets: 106 10*3/uL — ABNORMAL LOW (ref 150–400)
RBC: 2.61 MIL/uL — ABNORMAL LOW (ref 3.87–5.11)
RDW: 16.7 % — ABNORMAL HIGH (ref 11.5–15.5)
WBC: 5.5 10*3/uL (ref 4.0–10.5)
nRBC: 0.5 % — ABNORMAL HIGH (ref 0.0–0.2)

## 2021-05-31 LAB — MRSA NEXT GEN BY PCR, NASAL: MRSA by PCR Next Gen: NOT DETECTED

## 2021-05-31 LAB — CBG MONITORING, ED: Glucose-Capillary: 100 mg/dL — ABNORMAL HIGH (ref 70–99)

## 2021-05-31 LAB — RESP PANEL BY RT-PCR (FLU A&B, COVID) ARPGX2
Influenza A by PCR: NEGATIVE
Influenza B by PCR: NEGATIVE
SARS Coronavirus 2 by RT PCR: NEGATIVE

## 2021-05-31 LAB — APTT: aPTT: 32 seconds (ref 24–36)

## 2021-05-31 LAB — HEPATITIS B SURFACE ANTIGEN: Hepatitis B Surface Ag: NONREACTIVE

## 2021-05-31 LAB — LACTIC ACID, PLASMA: Lactic Acid, Venous: 1.1 mmol/L (ref 0.5–1.9)

## 2021-05-31 MED ORDER — FERROUS SULFATE 325 (65 FE) MG PO TABS
325.0000 mg | ORAL_TABLET | Freq: Two times a day (BID) | ORAL | Status: DC
  Administered 2021-06-01 – 2021-06-03 (×6): 325 mg via ORAL
  Filled 2021-05-31 (×6): qty 1

## 2021-05-31 MED ORDER — HEPARIN SODIUM (PORCINE) 1000 UNIT/ML DIALYSIS: 1000.0000 [IU] | INTRAMUSCULAR | Status: DC | PRN

## 2021-05-31 MED ORDER — CHLORHEXIDINE GLUCONATE CLOTH 2 % EX PADS
6.0000 | MEDICATED_PAD | Freq: Every day | CUTANEOUS | Status: DC
  Administered 2021-06-01 – 2021-06-03 (×2): 6 via TOPICAL

## 2021-05-31 MED ORDER — ACETAMINOPHEN 325 MG PO TABS
650.0000 mg | ORAL_TABLET | Freq: Four times a day (QID) | ORAL | Status: DC | PRN
  Administered 2021-06-02 – 2021-06-06 (×3): 650 mg via ORAL
  Filled 2021-05-31 (×3): qty 2

## 2021-05-31 MED ORDER — SODIUM CHLORIDE 0.9 % IV SOLN: 100.0000 mL | INTRAVENOUS | Status: DC | PRN

## 2021-05-31 MED ORDER — ALBUMIN HUMAN 25 % IV SOLN
25.0000 g | Freq: Once | INTRAVENOUS | Status: AC
  Filled 2021-05-31: qty 100

## 2021-05-31 MED ORDER — SODIUM CHLORIDE 0.9 % IV SOLN
250.0000 mL | INTRAVENOUS | Status: DC
Start: 2021-05-31 — End: 2021-06-07

## 2021-05-31 MED ORDER — METHYLPREDNISOLONE SODIUM SUCC 40 MG IJ SOLR
40.0000 mg | Freq: Two times a day (BID) | INTRAMUSCULAR | Status: DC
  Administered 2021-05-31 – 2021-06-02 (×5): 40 mg via INTRAVENOUS
  Filled 2021-05-31 (×6): qty 1

## 2021-05-31 MED ORDER — ACETAMINOPHEN 650 MG RE SUPP: 650.0000 mg | Freq: Four times a day (QID) | RECTAL | Status: DC | PRN

## 2021-05-31 MED ORDER — SODIUM CHLORIDE 0.9 % IV SOLN
2.0000 g | INTRAVENOUS | Status: AC
  Administered 2021-05-31 – 2021-06-04 (×5): 2 g via INTRAVENOUS
  Filled 2021-05-31 (×5): qty 20

## 2021-05-31 MED ORDER — VITAMIN B-12 100 MCG PO TABS
100.0000 ug | ORAL_TABLET | Freq: Every day | ORAL | Status: DC
  Administered 2021-06-01 – 2021-06-03 (×3): 100 ug via ORAL
  Filled 2021-05-31 (×7): qty 1

## 2021-05-31 MED ORDER — LIDOCAINE-PRILOCAINE 2.5-2.5 % EX CREA: 1.0000 "application " | TOPICAL_CREAM | CUTANEOUS | Status: DC | PRN

## 2021-05-31 MED ORDER — ALBUMIN HUMAN 25 % IV SOLN
25.0000 g | Freq: Once | INTRAVENOUS | Status: DC
  Filled 2021-05-31: qty 100

## 2021-05-31 MED ORDER — NOREPINEPHRINE 4 MG/250ML-% IV SOLN
2.0000 ug/min | INTRAVENOUS | Status: DC
  Administered 2021-06-01: 5 ug/min via INTRAVENOUS
  Administered 2021-06-01: 3 ug/min via INTRAVENOUS
  Administered 2021-06-01: 5 ug/min via INTRAVENOUS
  Administered 2021-06-01: 4 ug/min via INTRAVENOUS
  Filled 2021-05-31 (×2): qty 250

## 2021-05-31 MED ORDER — NALOXONE HCL 0.4 MG/ML IJ SOLN
0.4000 mg | INTRAMUSCULAR | Status: DC | PRN
  Administered 2021-05-31: 0.4 mg via INTRAVENOUS

## 2021-05-31 MED ORDER — SODIUM CHLORIDE 0.9% FLUSH
3.0000 mL | Freq: Two times a day (BID) | INTRAVENOUS | Status: DC
  Administered 2021-05-31 – 2021-06-07 (×14): 3 mL via INTRAVENOUS

## 2021-05-31 MED ORDER — NALOXONE HCL 2 MG/2ML IJ SOSY
PREFILLED_SYRINGE | INTRAMUSCULAR | Status: AC
  Administered 2021-05-31: 1.6 mg via INTRAVENOUS
  Filled 2021-05-31: qty 2

## 2021-05-31 MED ORDER — BENZONATATE 100 MG PO CAPS: 200.0000 mg | ORAL_CAPSULE | Freq: Three times a day (TID) | ORAL | Status: DC | PRN

## 2021-05-31 MED ORDER — ALBUTEROL SULFATE (2.5 MG/3ML) 0.083% IN NEBU
2.5000 mg | INHALATION_SOLUTION | Freq: Once | RESPIRATORY_TRACT | Status: AC
  Administered 2021-05-31: 2.5 mg via RESPIRATORY_TRACT
  Filled 2021-05-31: qty 3

## 2021-05-31 MED ORDER — LIDOCAINE HCL (PF) 1 % IJ SOLN: 5.0000 mL | INTRAMUSCULAR | Status: DC | PRN

## 2021-05-31 MED ORDER — ALBUMIN HUMAN 25 % IV SOLN
INTRAVENOUS | Status: AC
  Administered 2021-05-31: 25 g via INTRAVENOUS
  Filled 2021-05-31: qty 50

## 2021-05-31 MED ORDER — APIXABAN 2.5 MG PO TABS
2.5000 mg | ORAL_TABLET | Freq: Two times a day (BID) | ORAL | Status: DC
  Administered 2021-06-01 – 2021-06-03 (×5): 2.5 mg via ORAL
  Filled 2021-05-31 (×6): qty 1

## 2021-05-31 MED ORDER — ALTEPLASE 2 MG IJ SOLR
2.0000 mg | Freq: Once | INTRAMUSCULAR | Status: DC | PRN
  Filled 2021-05-31: qty 2

## 2021-05-31 MED ORDER — ONDANSETRON HCL 4 MG/2ML IJ SOLN
4.0000 mg | Freq: Four times a day (QID) | INTRAMUSCULAR | Status: DC | PRN
  Administered 2021-05-31: 4 mg via INTRAVENOUS
  Filled 2021-05-31: qty 2

## 2021-05-31 MED ORDER — ONDANSETRON HCL 4 MG PO TABS: 4.0000 mg | ORAL_TABLET | Freq: Four times a day (QID) | ORAL | Status: DC | PRN

## 2021-05-31 MED ORDER — SODIUM CHLORIDE 0.9 % IV SOLN
100.0000 mL | INTRAVENOUS | Status: DC | PRN
Start: 2021-05-31 — End: 2021-06-07

## 2021-05-31 MED ORDER — ALBUTEROL SULFATE (2.5 MG/3ML) 0.083% IN NEBU: 3.0000 mL | INHALATION_SOLUTION | Freq: Four times a day (QID) | RESPIRATORY_TRACT | Status: DC | PRN

## 2021-05-31 MED ORDER — PENTAFLUOROPROP-TETRAFLUOROETH EX AERO
1.0000 "application " | INHALATION_SPRAY | CUTANEOUS | Status: DC | PRN
  Filled 2021-05-31: qty 30

## 2021-05-31 MED ORDER — SEVELAMER CARBONATE 800 MG PO TABS
800.0000 mg | ORAL_TABLET | Freq: Three times a day (TID) | ORAL | Status: DC
  Administered 2021-06-01 – 2021-06-03 (×8): 800 mg via ORAL
  Filled 2021-05-31 (×8): qty 1

## 2021-05-31 MED ORDER — CHLORHEXIDINE GLUCONATE CLOTH 2 % EX PADS: 6.0000 | MEDICATED_PAD | Freq: Every day | CUTANEOUS | Status: DC

## 2021-05-31 MED ORDER — PRAMIPEXOLE DIHYDROCHLORIDE 0.25 MG PO TABS
0.1250 mg | ORAL_TABLET | Freq: Every day | ORAL | Status: DC
  Administered 2021-06-01 – 2021-06-03 (×3): 0.125 mg via ORAL
  Filled 2021-05-31 (×7): qty 1

## 2021-05-31 MED ORDER — ATORVASTATIN CALCIUM 40 MG PO TABS
40.0000 mg | ORAL_TABLET | Freq: Every day | ORAL | Status: DC
  Administered 2021-06-01 – 2021-06-03 (×3): 40 mg via ORAL
  Filled 2021-05-31 (×3): qty 1

## 2021-05-31 MED ORDER — ALBUMIN HUMAN 25 % IV SOLN
INTRAVENOUS | Status: AC
  Filled 2021-05-31: qty 50

## 2021-05-31 MED ORDER — SODIUM CHLORIDE 0.9 % IV SOLN
500.0000 mg | INTRAVENOUS | Status: AC
  Administered 2021-05-31 – 2021-06-03 (×4): 500 mg via INTRAVENOUS
  Filled 2021-05-31 (×4): qty 5

## 2021-05-31 MED ORDER — ASPIRIN EC 81 MG PO TBEC
81.0000 mg | DELAYED_RELEASE_TABLET | Freq: Every day | ORAL | Status: DC
  Administered 2021-06-01 – 2021-06-03 (×3): 81 mg via ORAL
  Filled 2021-05-31 (×3): qty 1

## 2021-05-31 MED ORDER — NOREPINEPHRINE 4 MG/250ML-% IV SOLN
INTRAVENOUS | Status: AC
  Administered 2021-05-31: 2 ug/min via INTRAVENOUS
  Filled 2021-05-31: qty 250

## 2021-05-31 MED ORDER — LACTATED RINGERS IV SOLN: INTRAVENOUS | Status: DC

## 2021-05-31 NOTE — ED Notes (Signed)
Date and time results received: 05/31/21 12:50 PM ? ? ?Test: PCO2  ?Critical Value: 73 ? ?Name of Provider Notified: Dr. Kathrynn Humble ? ?Orders Received? Or Actions Taken?: see orders ?

## 2021-05-31 NOTE — ED Notes (Signed)
RT contacted to restart the bipap. ? ?

## 2021-05-31 NOTE — Assessment & Plan Note (Addendum)
Initially Holding metoprolol due to hypotension>>restarted 4/21 ?

## 2021-05-31 NOTE — Assessment & Plan Note (Addendum)
CT head nonacute, no definite focal deficits on limited exam.  ?- Continue ASA, statin, apixaban ?Repeat CT brain 4/22--neg for acute findings ?

## 2021-05-31 NOTE — Assessment & Plan Note (Addendum)
S/p left first ray amputation 04/08/21--Dr. Jacqualyn Posey ?- Pain control meds include oxycodone and gabapentin which has recently been increased to '300mg'$ .  ?- wound looks clean and intact without signs of infection ?

## 2021-05-31 NOTE — Assessment & Plan Note (Addendum)
-   Nephrology consulted for HD ?- last HD 4/19>>3L removed UF ?-  HD 4/22 ?- last HD 4/25 ?

## 2021-05-31 NOTE — Assessment & Plan Note (Signed)
No wheezing, but is diminished with prolonged expiratory phase at this time.  ?- Start solumedrol '40mg'$  IV BID ?- Give albuterol neb x1 and then prn ?

## 2021-05-31 NOTE — ED Notes (Signed)
RT contacted to place pt on a bipap per EDP. ? ?

## 2021-05-31 NOTE — Assessment & Plan Note (Addendum)
Currently in AFib with controlled rate.  ?- Holding metoprolol due to sepsis, normotensive, bradycardic rate. ?- She is NPO and based on updated ABG needs to go back on BiPAP. We'll plan to give eliquis tonight if respiratory status stabilizes vs. starting heparin ?

## 2021-05-31 NOTE — Progress Notes (Signed)
removed 3561ms net fluid. pt did not tolerate dialysis at first. bp dropped to 579Nsystolic within first 20 minutes.  gave 25 grams albumin, with slight improvement only to drop down more and was not pulling fluid at that time.  primary RN obtained order to start vasopressin,  gave an additional 25 grams of albumin aso rdered  ran cold dialysis temp 35 , sodium modeling 148 linear, and uf 4 profile.  pressure maintained above 1504systolic for remainder of treatment.  pre bp 111/92 post bp 119/67  pre weight 92kg edw 82 post weight 88.5kg bed scales.  2 bandages to rua avf no bleeding dressing cdi. 262m levo sustained bp throughout tx. ?

## 2021-05-31 NOTE — ED Provider Notes (Signed)
?[place] ?Provider Note ? ? ?CSN: [id number] ?Arrival date & time: 05/31/21  1011 ? ?  ? ?History ? ?Chief Complaint  ?Patient presents with  ? Altered Mental Status  ? ? ?Jody [name] is a 81 y.o. female. ? ?HPI ? ?  ?81 year old past medical history significant for ESRD on hemodialysis TTS, diabetes type 2, A-fib, COPD on chronic 2 L of oxygen, diastolic heart failure, anemia of chronic kidney disease, hypertension, history of CVA brought into the ER with chief complaint of unresponsiveness. ? ?Level 5 caveat for unresponsive patient. ? ?Allegedly, patient was being transported to the dialysis center.  She was noted to be unresponsive, and the decision was made to bring her to the ER. ? ?I have tried to reach patient's home number and contact, no response or either 1 of those destinations. ? ?Patient is noted to be hypoxic and on 15 L of oxygen.  She is withdrawing to pain only. ? ?Home Medications ?Prior to Admission medications   ?Medication Sig Start Date End Date Taking? Authorizing Provider  ?acetaminophen (TYLENOL) 500 MG tablet Take 1 tablet (500 mg total) by mouth 3 (three) times daily. 04/13/21  Yes Regalado, Belkys A, MD  ?albuterol (ACCUNEB) 0.63 MG/3ML nebulizer solution Take 1 ampule by nebulization every 6 (six) hours as needed for wheezing.   Yes [provider]  ?albuterol (PROVENTIL HFA;VENTOLIN HFA) 108 (90 BASE) MCG/ACT inhaler Inhale 1-2 puffs into the lungs every 6 (six) hours as needed for wheezing or shortness of breath. Reported on 03/01/2015   Yes [provider]  ?apixaban (ELIQUIS) 2.5 MG TABS tablet Take 2.5 mg by mouth 2 (two) times daily.   Yes [provider]  ?aspirin EC 81 MG EC tablet Take 1 tablet (81 mg total) by mouth daily. Swallow whole. 04/14/21  Yes Regalado, Belkys A, MD  ?atorvastatin (LIPITOR) 40 MG tablet Take 1 tablet (40 mg total) by mouth daily. 04/14/21  Yes Regalado, Belkys A, MD  ?benzonatate (TESSALON) 200 MG capsule  Take 200 mg by mouth 3 (three) times daily as needed for cough.   Yes [provider]  ?ferrous sulfate 325 (65 FE) MG tablet Take 325 mg by mouth 2 (two) times daily with a meal.   Yes [provider]  ?gabapentin (NEURONTIN) 300 MG capsule Take 300 mg by mouth daily. 05/11/21  Yes [provider]  ?metoprolol tartrate (LOPRESSOR) 25 MG tablet Take 0.5 tablets (12.5 mg total) by mouth 2 (two) times daily. 10/18/16  Yes Kathie Dike, MD  ?oxyCODONE (ROXICODONE) 5 MG immediate release tablet Take 1 tablet (5 mg total) by mouth every 6 (six) hours as needed for severe pain. 04/29/21  Yes Trula Slade, DPM  ?pramipexole (MIRAPEX) 0.125 MG tablet Take 0.125 mg by mouth daily.   Yes [provider]  ?sevelamer carbonate (RENVELA) 800 MG tablet Take 800 mg by mouth 3 (three) times daily with meals.  07/22/19  Yes [provider]  ?vitamin B-12 100 MCG tablet Take 1 tablet (100 mcg total) by mouth daily. 04/14/21  Yes Regalado, Belkys A, MD  ?ceFEPIme 2 g in sodium chloride 0.9 % 100 mL Inject 2 g into the vein every Tuesday, Thursday, and Saturday at 6 PM. 04/14/21   Regalado, Belkys A, MD  ?gabapentin (NEURONTIN) 100 MG capsule Take 1 capsule (100 mg total) by mouth at bedtime. ?Patient not taking: Reported on 05/31/2021 04/13/21   Elmarie Shiley, MD  ?   ? ?Allergies    ?  Codeine   ? ?Review of Systems   ?Review of Systems ? ?Physical Exam ?Updated Vital Signs ?BP 118/87   Pulse 75   Temp 97.8 ?F (36.6 ?C) (Oral)   Resp 13   Ht '5\' 7"'$  (1.702 m) Comment: previously filed  Wt 84.4 kg Comment: previously filed  SpO2 93%   BMI 29.13 kg/m?  ?Physical Exam ?Vitals and nursing note reviewed.  ?Constitutional:   ?   Appearance: She is well-developed.  ?   Comments: Somnolent  ?HENT:  ?   Head: Atraumatic.  ?Cardiovascular:  ?   Rate and Rhythm: Normal rate. Rhythm irregular.  ?Pulmonary:  ?   Comments: Tachypnea, increased work of breathing ?Abdominal:  ?   Tenderness: There is  no abdominal tenderness.  ?Musculoskeletal:  ?   Right lower leg: Edema present.  ?   Left lower leg: Edema present.  ?Skin: ?   General: Skin is warm and dry.  ?Neurological:  ?   Mental Status: She is disoriented.  ?   Comments: Somnolent, moving all 4 extremities to painful stimuli  ? ? ?ED Results / Procedures / Treatments   ?Labs ?(all labs ordered are listed, but only abnormal results are displayed) ?Labs Reviewed  ?COMPREHENSIVE METABOLIC PANEL - Abnormal; Notable for the following components:  ?    Result Value  ? Chloride 96 (*)   ? Glucose, Bld 106 (*)   ? BUN 50 (*)   ? Creatinine, Ser 4.42 (*)   ? GFR, Estimated 9 (*)   ? All other components within normal limits  ?CBC WITH DIFFERENTIAL/PLATELET - Abnormal; Notable for the following components:  ? RBC 2.70 (*)   ? Hemoglobin 9.1 (*)   ? HCT 30.5 (*)   ? MCV 113.0 (*)   ? MCHC 29.8 (*)   ? RDW 16.6 (*)   ? Platelets 111 (*)   ? nRBC 0.8 (*)   ? All other components within normal limits  ?PROTIME-INR - Abnormal; Notable for the following components:  ? Prothrombin Time 18.5 (*)   ? INR 1.6 (*)   ? All other components within normal limits  ?BLOOD GAS, VENOUS - Abnormal; Notable for the following components:  ? pH, Ven 7.24 (*)   ? pCO2, Ven 73 (*)   ? Bicarbonate 31.4 (*)   ? Acid-Base Excess 2.6 (*)   ? All other components within normal limits  ?CBG MONITORING, ED - Abnormal; Notable for the following components:  ? Glucose-Capillary 100 (*)   ? All other components within normal limits  ?RESP PANEL BY RT-PCR (FLU A&B, COVID) [id number]  ?CULTURE, BLOOD (ROUTINE X 2)  ?CULTURE, BLOOD (ROUTINE X 2)  ?APTT  ?LACTIC ACID, PLASMA  ?URINALYSIS, ROUTINE W REFLEX MICROSCOPIC  ?AMMONIA  ?LACTIC ACID, PLASMA  ?BLOOD GAS, ARTERIAL  ? ? ?EKG ?EKG Interpretation ? ?Date/Time:  Tuesday May 31 2021 12:19:13 EDT ?Ventricular Rate:  71 ?PR Interval:  232 ?QRS Duration: 116 ?QT Interval:  365 ?QTC Calculation: 397 ?R Axis:   112 ?Text Interpretation: Irregular Atrial  fibrillation no ischemia signs Premature ventricular complexes Confirmed by Varney Biles [phone]) on 05/31/2021 12:56:21 PM ? ?Radiology ?DG Chest Port 1 View ? ?Result Date: 05/31/2021 ?CLINICAL DATA:  Decreased responsiveness EXAM: PORTABLE CHEST 1 VIEW COMPARISON:  03/17/2021 FINDINGS: Stable cardiomegaly. Aortic atherosclerosis. Low lung volumes. Mild vascular congestion. Focal airspace opacity in the left lower lobe. Superimposed streaky bibasilar opacities. More reticulonodular markings within the upper lung fields. No pleural effusion or pneumothorax.  IMPRESSION: Focal airspace opacity in the left lower lobe concerning for pneumonia. Electronically Signed   By: Davina Poke D.O.   On: 05/31/2021 11:52   ? ?Procedures ?Marland KitchenCritical Care ?Performed by: Varney Biles, MD ?Authorized by: Varney Biles, MD  ? ?Critical care provider statement:  ?  Critical care time (minutes):  78 ?  Critical care was necessary to treat or prevent imminent or life-threatening deterioration of the following conditions:  Circulatory failure, respiratory failure and renal failure ?  Critical care was time spent personally by me on the following activities:  Development of treatment plan with patient or surrogate, discussions with consultants, evaluation of patient's response to treatment, examination of patient, ordering and review of laboratory studies, ordering and review of radiographic studies, ordering and performing treatments and interventions, pulse oximetry, re-evaluation of patient's condition and review of old charts  ? ? ?Medications Ordered in ED ?Medications  ?naloxone [facility]) injection 0.4 mg (0.4 mg Intravenous Given 05/31/21 1127)  ?cefTRIAXone (ROCEPHIN) 2 g in sodium chloride 0.9 % 100 mL IVPB (2 g Intravenous New Bag/Given 05/31/21 1325)  ?azithromycin (ZITHROMAX) 500 mg in sodium chloride 0.9 % 250 mL IVPB (has no administration in time range)  ?naloxone [facility]) 2 MG/2ML injection (1.6 mg Intravenous Given  05/31/21 1130)  ? ? ?ED Course/ Medical Decision Making/ A&P ?Clinical Course as of 05/31/21 1352  ?Tue May 31, 2021  ?1245 Blood gas, venous (at [facility] and [facility], not at [facility])(!!) ?Patient has been placed on BiPAP.  We w

## 2021-05-31 NOTE — Assessment & Plan Note (Signed)
Due to LLL pneumonia and COPD exacerbation.  ?- Tx pneumonia and COPD as below ?- Wean FiO2 ?- Continue BiPAP, admit to SDU. HCPOA has confirmed pt's full code status, consent to intubation if needed. ?

## 2021-05-31 NOTE — Assessment & Plan Note (Addendum)
Was above her usual dry weight ?- Manage volume with HD ?-06/03/21 Echo EF 50%, RV overload, severe TR ?-08/02/20 Echo EF 45-50% HK inferoseptal, inferolateral, Inf wall; mod TR ?-personally reviewed CXR 4/21--increased interstitial markings ?

## 2021-05-31 NOTE — Progress Notes (Signed)
Contacted by Dr. Bonner Puna that Jody [name] was being admitted with acute hypoxic and hypercarbic respiratory failure and missed her dialysis session today.  Pt admitted to the ICU and HD orders written.  She will be seen tomorrow. ?

## 2021-05-31 NOTE — ED Notes (Signed)
Pt belongings including a jacket, bra, watch and top/bottom denture placed in a pt belonging bag and taken to ICU. ?

## 2021-05-31 NOTE — Assessment & Plan Note (Signed)
Continue statin and ASA 

## 2021-05-31 NOTE — Assessment & Plan Note (Addendum)
Fairly chronic dating back to Feb 2023 ?-B12--1435 ?-folate-7.0 ?-TSH--1.132 ?Due to infection and acute medical illness ?

## 2021-05-31 NOTE — ED Notes (Signed)
Pt placed on a NR due to mouth breathing -EDP made aware. ? ?

## 2021-05-31 NOTE — ED Triage Notes (Addendum)
Pt to the ED from [facility] via [facility]. ? ?Pt went unresponsive with transit van on the way to dialysis. Nurses came outside and performed a sternal rub with no success. ? ?Sternal rub during triage solicits a response but mentation is questionable. ?

## 2021-05-31 NOTE — Progress Notes (Signed)
RT called per patient vomiting while on BIPAP. Upon entering room BIPAP had been removed and patient had been placed on a NRB at 15L. RT gave breathing treatment that was ordered and trialed patient on 15L salter cannula but patient was unable to maintain sats. RT removed salter and placed patient back on NRB. Patient is tolerating well. BIPAP is in room on standby but will not be placed on patient at this time due to patient having episodes of vomiting. RT informed RN that after dialysis RT would attempt to place patient back on BIPAP given the zofran works and if not we would attempt the salter again. Will monitor as needed. ?

## 2021-05-31 NOTE — Assessment & Plan Note (Addendum)
-   baseline Hgb 8-9 ?-no signs of blood loss ?

## 2021-05-31 NOTE — H&P (Signed)
?History and Physical  ? ? ?Patient: Jody [name] KCM:[id number] DOB: Dec 03, 1940 ?DOA: 05/31/2021 ?DOS: the patient was seen and examined on 05/31/2021 ?PCP: Monico Blitz, MD  ?Patient coming from: SNF ? ?Chief Complaint:  ?Chief Complaint  ?Patient presents with  ? Altered Mental Status  ? ?HPI: Jody [name] is an 80 y.o. female with medical history significant of stroke, ESRD, COPD on 2L O2, HFpEF, and PAD with recent left great toe osteomyelitis treated with amputation who [facility] to the [facility] due to unresponsiveness. She was picked up at her nursing facility to go to routine HD and became responsive only to sternal rub so was transferred to the ED instead. She was found to be hypoxic and hypercarbic requiring BiPAP. With BiPAP her mentation has improved. She is able to give a limited history, reports a recent history of coughing and feeling unwell generally but denies shortness of breath or chest pain. Denies recent wheezing.  ? ?CXR demonstrated hypoventilation with streaky bibasilar opacities with enlarged cardiac silhouette and left lower lobe opacity consistent with pneumonia. CT head revealed atrophy and chronic infarcts without acute pathology. Blood cultures were drawn, antibiotics and IV fluids administered, and hospitalists consulted for admission. ? ?Review of Systems: As mentioned in the history of present illness. All other systems reviewed and are negative. ?Past Medical History:  ?Diagnosis Date  ? Anemia in chronic kidney disease (CODE) 06/05/2015  ? Arthritis   ? "bad in my legs" (12/07/2014)  ? Chronic atrial fibrillation ([place]) 06/05/2015  ? Chronic back pain   ? "dr said my spine is crooked"  ? Chronic bronchitis ([place])   ? "get it q yr" (12/07/2014)  ? CKD stage 4 due to type 2 diabetes mellitus ([place]) 06/05/2015  ? Complication of anesthesia   ? headache for 2 days after surgery in May 2019  ? COPD (chronic obstructive pulmonary disease) ([place])   ? Gait difficulty 09/02/2014  ?  Hypercholesterolemia   ? Hypertension   ? Neuropathy 2015  ? in legs  ? NSVT (nonsustained ventricular tachycardia) ([place])   ? Sinus brady-tachy syndrome ([place])   ? Stroke [facility]) 05/2014  ? denies residual on 12/07/2014  ? Type II diabetes mellitus ([place])   ? ?Past Surgical History:  ?Procedure Laterality Date  ? ABDOMINAL AORTOGRAM W/LOWER EXTREMITY Left 04/06/2021  ? Procedure: ABDOMINAL AORTOGRAM W/LOWER EXTREMITY;  Surgeon: Broadus John, MD;  Location: [place] [facility];  Service: Cardiovascular;  Laterality: Left;  ? ABDOMINAL HYSTERECTOMY  ~ 1970  ? AMPUTATION TOE Left 04/08/2021  ? Procedure: FIRST TOE LEFT FOOT AMPUTATION;  Surgeon: Trula Slade, DPM;  Location: [place];  Service: Podiatry;  Laterality: Left;  ? APPENDECTOMY  ~ 1970  ? AV FISTULA PLACEMENT Right 07/06/2017  ? Procedure: RIGHT RADIOCEPHALIC ARTERIOVENOUS FISTULA creation;  Surgeon: Conrad Daykin, MD;  Location: [place];  Service: Vascular;  Laterality: Right;  ? BASCILIC VEIN TRANSPOSITION Right 09/17/2017  ? Procedure: SECOND STAGE BASILIC VEIN TRANSPOSITION RIGHT ARM;  Surgeon: Rosetta Posner, MD;  Location: [place];  Service: Vascular;  Laterality: Right;  ? COLONOSCOPY N/A 10/28/2017  ? Procedure: COLONOSCOPY;  Surgeon: Rogene Houston, MD;  [facility];  Service: Endoscopy;  Laterality: N/A;  ? COLONOSCOPY WITH PROPOFOL N/A 12/29/2019  ? Procedure: COLONOSCOPY WITH PROPOFOL;  Surgeon: Eloise Harman, DO;  [facility];  Service: Endoscopy;  Laterality: N/A;  ? ESOPHAGOGASTRODUODENOSCOPY (EGD) WITH PROPOFOL N/A 10/31/2017  ? Procedure: ESOPHAGOGASTRODUODENOSCOPY (EGD) WITH PROPOFOL;  Surgeon: Rogene Houston, MD;  [facility];  Service: Endoscopy;  Laterality: N/A;  ? JOINT REPLACEMENT    ? PERIPHERAL VASCULAR BALLOON ANGIOPLASTY Left 04/06/2021  ? Procedure: PERIPHERAL VASCULAR BALLOON ANGIOPLASTY;  Surgeon: Broadus John, MD;  Location: [place] [facility];  Service: Cardiovascular;  Laterality: Left;   left sfa succesful ?[place] AT Unsuccesful  ? TOTAL KNEE ARTHROPLASTY Right ~ 1986  ? TUMOR EXCISION  ~ 1970  ? "9# in my stomach"  ? ?Social History:  reports that she quit smoking about 17 years ago. Her smoking use included cigarettes. She started smoking about 68 years ago. She has a 25.00 pack-year smoking history. She has never used smokeless tobacco. She reports that she does not drink alcohol and does not use drugs. ? ?Allergies  ?Allergen Reactions  ? Codeine Nausea And Vomiting  ? ? ?Family History  ?Problem Relation Age of Onset  ? Cancer Other   ? Heart attack Brother   ? Cancer Sister   ?     breast  ? Alcohol abuse Brother   ? ? ?Prior to Admission medications   ?Medication Sig Start Date End Date Taking? Authorizing Provider  ?acetaminophen (TYLENOL) 500 MG tablet Take 1 tablet (500 mg total) by mouth 3 (three) times daily. 04/13/21  Yes Regalado, Belkys A, MD  ?albuterol (ACCUNEB) 0.63 MG/3ML nebulizer solution Take 1 ampule by nebulization every 6 (six) hours as needed for wheezing.   Yes [provider]  ?albuterol (PROVENTIL HFA;VENTOLIN HFA) 108 (90 BASE) MCG/ACT inhaler Inhale 1-2 puffs into the lungs every 6 (six) hours as needed for wheezing or shortness of breath. Reported on 03/01/2015   Yes [provider]  ?apixaban (ELIQUIS) 2.5 MG TABS tablet Take 2.5 mg by mouth 2 (two) times daily.   Yes [provider]  ?aspirin EC 81 MG EC tablet Take 1 tablet (81 mg total) by mouth daily. Swallow whole. 04/14/21  Yes Regalado, Belkys A, MD  ?atorvastatin (LIPITOR) 40 MG tablet Take 1 tablet (40 mg total) by mouth daily. 04/14/21  Yes Regalado, Belkys A, MD  ?benzonatate (TESSALON) 200 MG capsule Take 200 mg by mouth 3 (three) times daily as needed for cough.   Yes [provider]  ?ferrous sulfate 325 (65 FE) MG tablet Take 325 mg by mouth 2 (two) times daily with a meal.   Yes [provider]  ?gabapentin (NEURONTIN) 300 MG capsule Take 300 mg by mouth daily.  05/11/21  Yes [provider]  ?metoprolol tartrate (LOPRESSOR) 25 MG tablet Take 0.5 tablets (12.5 mg total) by mouth 2 (two) times daily. 10/18/16  Yes Kathie Dike, MD  ?oxyCODONE (ROXICODONE) 5 MG immediate release tablet Take 1 tablet (5 mg total) by mouth every 6 (six) hours as needed for severe pain. 04/29/21  Yes Trula Slade, DPM  ?pramipexole (MIRAPEX) 0.125 MG tablet Take 0.125 mg by mouth daily.   Yes [provider]  ?sevelamer carbonate (RENVELA) 800 MG tablet Take 800 mg by mouth 3 (three) times daily with meals.  07/22/19  Yes [provider]  ?vitamin B-12 100 MCG tablet Take 1 tablet (100 mcg total) by mouth daily. 04/14/21  Yes Regalado, Belkys A, MD  ?ceFEPIme 2 g in sodium chloride 0.9 % 100 mL Inject 2 g into the vein every Tuesday, Thursday, and Saturday at 6 PM. 04/14/21   Regalado, Belkys A, MD  ?gabapentin (NEURONTIN) 100 MG capsule Take 1 capsule (100 mg total) by mouth  at bedtime. ?Patient not taking: Reported on 05/31/2021 04/13/21   Elmarie Shiley, MD  ? ? ?Physical Exam: ?Vitals:  ? 05/31/21 1325 05/31/21 1400 05/31/21 1430 05/31/21 1510  ?BP:  117/67 98/65   ?Pulse: 75 70 68 66  ?Resp: '13 15 14 14  '$ ?Temp:      ?TempSrc:      ?SpO2: 93% 98% 98% 96%  ?Weight:      ?Height:      ?Gen: Elderly frail female in no distress ?Pulm: Nonlabored tachypnea on NRB, Limited excursions and diminished globally. Prolonged expiration without wheezes. ?CV: Irreg irreg, rate in 70's without murmur, rub, or gallop. No definite JVD, no dependent edema. ?GI: Abdomen soft, non-tender, non-distended, with normoactive bowel sounds.  ?Ext: Distal feet cool, dry. Left foot with surgical incision healing with well apposed wound edges, no dehiscence, no exudate or hemorrhage, no erythema, it is nontender without fluctuance or induration. ?Skin: No other rashes, lesions or ulcers on visualized skin. ?Neuro: Drowsy but interactive and speaking in full sentences thru NRB. Incompletely  oriented without acute focal neurological deficits. ?Psych: Judgement and insight appear marginal. Mood euthymic & affect congruent. Behavior is appropriate.   ? ?Data Reviewed: ?CXR (personally reviewed) demonstrated hypovent

## 2021-05-31 NOTE — Progress Notes (Signed)
Elink following code sepsis °

## 2021-05-31 NOTE — Assessment & Plan Note (Signed)
Continue iron supplement.

## 2021-06-01 DIAGNOSIS — J441 Chronic obstructive pulmonary disease with (acute) exacerbation: Secondary | ICD-10-CM

## 2021-06-01 DIAGNOSIS — J9622 Acute and chronic respiratory failure with hypercapnia: Secondary | ICD-10-CM

## 2021-06-01 DIAGNOSIS — J9621 Acute and chronic respiratory failure with hypoxia: Secondary | ICD-10-CM

## 2021-06-01 DIAGNOSIS — J181 Lobar pneumonia, unspecified organism: Secondary | ICD-10-CM

## 2021-06-01 DIAGNOSIS — M86172 Other acute osteomyelitis, left ankle and foot: Secondary | ICD-10-CM | POA: Diagnosis not present

## 2021-06-01 DIAGNOSIS — A419 Sepsis, unspecified organism: Secondary | ICD-10-CM

## 2021-06-01 DIAGNOSIS — I5032 Chronic diastolic (congestive) heart failure: Secondary | ICD-10-CM | POA: Diagnosis not present

## 2021-06-01 DIAGNOSIS — I4819 Other persistent atrial fibrillation: Secondary | ICD-10-CM

## 2021-06-01 LAB — BASIC METABOLIC PANEL
Anion gap: 14 (ref 5–15)
BUN: 34 mg/dL — ABNORMAL HIGH (ref 8–23)
CO2: 23 mmol/L (ref 22–32)
Calcium: 9 mg/dL (ref 8.9–10.3)
Chloride: 99 mmol/L (ref 98–111)
Creatinine, Ser: 3.1 mg/dL — ABNORMAL HIGH (ref 0.44–1.00)
GFR, Estimated: 14 mL/min — ABNORMAL LOW (ref >60)
Glucose, Bld: 103 mg/dL — ABNORMAL HIGH (ref 70–99)
Potassium: 4.3 mmol/L (ref 3.5–5.1)
Sodium: 136 mmol/L (ref 135–145)

## 2021-06-01 LAB — CBC
HCT: 32.4 % — ABNORMAL LOW (ref 36.0–46.0)
Hemoglobin: 9.2 g/dL — ABNORMAL LOW (ref 12.0–15.0)
MCH: 33.8 pg (ref 26.0–34.0)
MCHC: 28.4 g/dL — ABNORMAL LOW (ref 30.0–36.0)
MCV: 119.1 fL — ABNORMAL HIGH (ref 80.0–100.0)
Platelets: 111 10*3/uL — ABNORMAL LOW (ref 150–400)
RBC: 2.72 MIL/uL — ABNORMAL LOW (ref 3.87–5.11)
RDW: 16.5 % — ABNORMAL HIGH (ref 11.5–15.5)
WBC: 8.2 10*3/uL (ref 4.0–10.5)
nRBC: 1.1 % — ABNORMAL HIGH (ref 0.0–0.2)

## 2021-06-01 LAB — GLUCOSE, CAPILLARY: Glucose-Capillary: 93 mg/dL (ref 70–99)

## 2021-06-01 LAB — HEPATITIS B SURFACE ANTIBODY, QUANTITATIVE: Hep B S AB Quant (Post): 12 m[IU]/mL (ref >9.9)

## 2021-06-01 MED ORDER — ALBUMIN HUMAN 25 % IV SOLN
INTRAVENOUS | Status: AC
  Administered 2021-06-01: 25 g
  Filled 2021-06-01: qty 100

## 2021-06-01 MED ORDER — IPRATROPIUM BROMIDE 0.02 % IN SOLN
0.5000 mg | Freq: Four times a day (QID) | RESPIRATORY_TRACT | Status: DC
  Administered 2021-06-01 – 2021-06-07 (×24): 0.5 mg via RESPIRATORY_TRACT
  Filled 2021-06-01 (×22): qty 2.5

## 2021-06-01 MED ORDER — DARBEPOETIN ALFA 60 MCG/0.3ML IJ SOSY
60.0000 ug | PREFILLED_SYRINGE | INTRAMUSCULAR | Status: DC
  Administered 2021-06-01: 60 ug via INTRAVENOUS
  Filled 2021-06-01 (×2): qty 0.3

## 2021-06-01 MED ORDER — BUDESONIDE 0.5 MG/2ML IN SUSP
0.5000 mg | Freq: Two times a day (BID) | RESPIRATORY_TRACT | Status: DC
  Administered 2021-06-01 – 2021-06-07 (×12): 0.5 mg via RESPIRATORY_TRACT
  Filled 2021-06-01 (×13): qty 2

## 2021-06-01 MED ORDER — RENA-VITE PO TABS
1.0000 | ORAL_TABLET | Freq: Every day | ORAL | Status: DC
Start: 2021-06-01 — End: 2021-06-03
  Administered 2021-06-01 – 2021-06-02 (×2): 1 via ORAL
  Filled 2021-06-01 (×2): qty 1

## 2021-06-01 MED ORDER — DILTIAZEM HCL 25 MG/5ML IV SOLN
5.0000 mg | Freq: Once | INTRAVENOUS | Status: AC
  Administered 2021-06-02: 5 mg via INTRAVENOUS
  Filled 2021-06-01: qty 5

## 2021-06-01 MED ORDER — NEPRO/CARBSTEADY PO LIQD
237.0000 mL | Freq: Two times a day (BID) | ORAL | Status: DC
  Administered 2021-06-02 – 2021-06-07 (×8): 237 mL via ORAL

## 2021-06-01 MED ORDER — ALBUMIN HUMAN 25 % IV SOLN
25.0000 g | Freq: Once | INTRAVENOUS | Status: AC
  Administered 2021-06-01: 25 g via INTRAVENOUS
  Filled 2021-06-01: qty 100

## 2021-06-01 MED ORDER — LEVALBUTEROL HCL 0.63 MG/3ML IN NEBU
0.6300 mg | INHALATION_SOLUTION | Freq: Four times a day (QID) | RESPIRATORY_TRACT | Status: DC
  Administered 2021-06-01 – 2021-06-07 (×24): 0.63 mg via RESPIRATORY_TRACT
  Filled 2021-06-01 (×22): qty 3

## 2021-06-01 NOTE — Assessment & Plan Note (Addendum)
Due to pneumonia ?-present on admission ?-presented with tachypnea, tachycardia, hypotension ?-personally reviewed CXR--bibasilar opacity L>R ?-continue ceftriaxone and azithromycin ?-PCT--1.05 ?-06/02/21 AM--weaned off levophed ?-remains stable off levophed 4/21 and 4/22 and 4/23 ?-sepsis physiology resolved ?

## 2021-06-01 NOTE — Progress Notes (Signed)
?   06/01/21 1901  ?Vitals  ?BP (!) 109/36  ?ECG Heart Rate 92  ?Resp 19  ?Oxygen Therapy  ?SpO2 98 %  ?O2 Device Nasal Cannula  ?O2 Flow Rate (L/min) 2 L/min  ?Pain Assessment  ?Pain Scale 0-10  ?Pain Score 0  ?Dialysis Weight  ?Weight 86.4 kg  ?Type of Weight Post-Dialysis  ?Post-Hemodialysis Assessment  ?Post-Hemodialysis Comments Net UF 3070m removed, tx completed with aid of albumin and pressor. An increase in levo from 3 to 4 mcg required near start of tx.  Another increase needed mid tx. Albumin 25g x 2 also administered as ordered.  Profile 4 on machine utilize. BP improved toward baseline after rinseback 109/36.  ?AVG/AVF Arterial Site Held (minutes) 7 minutes  ?AVG/AVF Venous Site Held (minutes) 12 minutes  ?Education / Care Plan  ?Dialysis Education Provided Yes  ?Fistula / Graft Right Upper arm Arteriovenous fistula  ?No Placement Date or Time found.   Placed prior to admission: Yes  Orientation: Right  Access Location: Upper arm  Access Type: Arteriovenous fistula  ?Site Condition No complications  ?Fistula / Graft Assessment Present;Thrill;Bruit  ?Status Deaccessed  ? ? ?

## 2021-06-01 NOTE — [place] FL2 (Signed)
?[place] MEDICAID FL2 LEVEL OF CARE SCREENING TOOL  ?  ? ?IDENTIFICATION  ?Patient Name: ?Jody [name] Birthdate: 1940-11-08 Sex: female Admission Date (Current Location): ?05/31/2021  ?[place] and [place] Number: ? [place] and Address:  ?[facility] [address] [place] ?     Provider Number: ?[id number]  ?Attending Physician Name and Address:  ?Orson Eva, MD ? Relative Name and Phone Number:  ?  ?   ?Current Level of Care: ?Hospital Recommended Level of Care: ?[place] Prior Approval Number: ?  ? ?Date Approved/Denied: ?  PASRR Number: ?  ? ?Discharge Plan: ?SNF ?  ? ?Current Diagnoses: ?Patient Active Problem List  ? Diagnosis Date Noted  ? Acute respiratory failure with hypercapnia ([place]) 05/31/2021  ? Thrombocytopenia ([place]) 05/31/2021  ? Acute hematogenous osteomyelitis of right foot ([place]) 05/23/2021  ? PAD (peripheral artery disease) ([place]) 05/23/2021  ? Leg pain 04/10/2021  ? Acute metabolic encephalopathy 33/82/5053  ? Sinus bradycardia 04/07/2021  ? Hypokalemia 04/05/2021  ? History of CVA (cerebrovascular accident) 04/04/2021  ? Acute osteomyelitis of left foot ([place]) 04/04/2021  ? Chronic respiratory failure ([place]) 04/04/2021  ? Anemia of chronic kidney failure 04/04/2021  ? Hyponatremia 04/04/2021  ? Memory loss 04/04/2021  ? Pressure injury of skin 03/04/2021  ? Rectal bleeding 12/27/2019  ? NSVT (nonsustained ventricular tachycardia) ([place]) 10/27/2019  ? Megaloblastic anemia 10/26/2019  ? Chronic blood loss anemia 10/26/2017  ? Iron deficiency anemia 02/23/2017  ? Chronic diastolic CHF (congestive heart failure) ([place]) 10/15/2016  ? Type 2 diabetes mellitus with nephropathy ([place]) 10/15/2016  ? ESRD on dialysis ([place]) 10/15/2016  ? NSTEMI (non-ST elevated myocardial infarction) ([place]) 10/03/2015  ? COPD with chornic respiratory failure on oxygen 2L [place] 06/05/2015  ? Anemia in chronic kidney disease, Stage IV 06/05/2015  ? Chronic atrial fibrillation  ([place]) 06/05/2015  ? TIA (transient ischemic attack) 09/02/2014  ? Gait difficulty 09/02/2014  ? Other specified transient cerebral ischemias   ? Stroke with cerebral ischemia ([place])   ? Hemispheric carotid artery syndrome   ? Essential hypertension   ? ? ?Orientation RESPIRATION BLADDER Height & Weight   ?  ?Self, Place ? O2 (2L) External catheter Weight: 197 lb 1.5 oz (89.4 kg) ?Height:  '5\' 7"'$  (170.2 cm)  ?BEHAVIORAL SYMPTOMS/MOOD NEUROLOGICAL BOWEL NUTRITION STATUS  ?    Incontinent Diet (Dysphagia 2 with thin liquids. See d/c summary for updates.)  ?AMBULATORY STATUS COMMUNICATION OF NEEDS Skin   ?Total Care Verbally Other (Comment) (Redness to abdomen and groin.) ?  ?  ?  ?    ?     ?     ? ? ?Personal Care Assistance Level of Assistance  ?Bathing, Feeding, Dressing Bathing Assistance: Maximum assistance ?Feeding assistance: Limited assistance ?Dressing Assistance: Maximum assistance ?   ? ?Functional Limitations Info  ?Sight, Hearing, Speech Sight Info: Adequate ?Hearing Info: Adequate ?Speech Info: Adequate  ? ? ?SPECIAL CARE FACTORS FREQUENCY  ?    ?  ?  ?  ?  ?  ?  ?   ? ? ?Contractures    ? ? ?Additional Factors Info  ?Code Status, Allergies Code Status Info: Full code ?Allergies Info: Codeine ?  ?  ?  ?   ? ?Current Medications (06/01/2021):  This is the current hospital active medication list ?Current Facility-Administered Medications  ?Medication Dose Route Frequency Provider Last Rate Last Admin  ? 0.9 %  sodium chloride infusion  100 mL Intravenous PRN  Donato Heinz, MD      ? 0.9 %  sodium chloride infusion  100 mL Intravenous PRN Donato Heinz, MD      ? 0.9 %  sodium chloride infusion  250 mL Intravenous Continuous Patrecia Pour, MD      ? acetaminophen (TYLENOL) tablet 650 mg  650 mg Oral Q6H PRN Patrecia Pour, MD      ? Or  ? acetaminophen (TYLENOL) suppository 650 mg  650 mg Rectal Q6H PRN Patrecia Pour, MD      ? albumin human 25 % solution 25 g  25 g Intravenous Once Donato Heinz,  MD 60 mL/hr at 05/31/21 1715 Restarted at 05/31/21 1715  ? albuterol (PROVENTIL) (2.5 MG/3ML) 0.083% nebulizer solution 3 mL  3 mL Nebulization Q6H PRN Patrecia Pour, MD      ? alteplase (CATHFLO ACTIVASE) injection 2 mg  2 mg Intracatheter Once PRN Donato Heinz, MD      ? apixaban Arne [place]) tablet 2.5 mg  2.5 mg Oral BID Patrecia Pour, MD   2.5 mg at 06/01/21 0948  ? aspirin EC tablet 81 mg  81 mg Oral Daily Patrecia Pour, MD   81 mg at 06/01/21 0950  ? atorvastatin (LIPITOR) tablet 40 mg  40 mg Oral Daily Patrecia Pour, MD   40 mg at 06/01/21 0946  ? azithromycin (ZITHROMAX) 500 mg in sodium chloride 0.9 % 250 mL IVPB  500 mg Intravenous Q24H Patrecia Pour, MD   Stopped at 05/31/21 1528  ? benzonatate (TESSALON) capsule 200 mg  200 mg Oral TID PRN Patrecia Pour, MD      ? cefTRIAXone (ROCEPHIN) 2 g in sodium chloride 0.9 % 100 mL IVPB  2 g Intravenous Q24H Patrecia Pour, MD   Stopped at 05/31/21 1400  ? Chlorhexidine Gluconate Cloth 2 % PADS 6 each  6 each Topical Q0600 Donato Heinz, MD   6 each at 06/01/21 1022  ? Darbepoetin Alfa (ARANESP) injection 60 mcg  60 mcg Intravenous Q Wed-HD Donato Heinz, MD      ? ferrous sulfate tablet 325 mg  325 mg Oral BID WC Patrecia Pour, MD   325 mg at 06/01/21 0946  ? heparin injection 1,000 Units  1,000 Units Dialysis PRN Donato Heinz, MD      ? lidocaine (PF) (XYLOCAINE) 1 % injection 5 mL  5 mL Intradermal PRN Donato Heinz, MD      ? lidocaine-prilocaine (EMLA) cream 1 application.  1 application. Topical PRN Donato Heinz, MD      ? methylPREDNISolone sodium succinate (SOLU-MEDROL) 40 mg/mL injection 40 mg  40 mg Intravenous Q12H Patrecia Pour, MD   40 mg at 06/01/21 0351  ? naloxone [facility]) injection 0.4 mg  0.4 mg Intravenous PRN Vance Gather B, MD   0.4 mg at 05/31/21 1127  ? norepinephrine (LEVOPHED) '4mg'$  in 230m (0.016 mg/mL) premix infusion  2-10 mcg/min Intravenous Titrated GPatrecia Pour MD   Stopped at 06/01/21 0940  ?  ondansetron (ZOFRAN) tablet 4 mg  4 mg Oral Q6H PRN GPatrecia Pour MD      ? Or  ? ondansetron (ZOFRAN) injection 4 mg  4 mg Intravenous Q6H PRN GPatrecia Pour MD   4 mg at 05/31/21 1536  ? pentafluoroprop-tetrafluoroeth (GEBAUERS) aerosol 1 application.  1 application. Topical PRN CDonato Heinz MD      ? pramipexole (MIRAPEX) tablet 0.125 mg  0.125 mg Oral Daily Grunz,  Meredith Leeds, MD   0.125 mg at 06/01/21 0948  ? sevelamer carbonate (RENVELA) tablet 800 mg  800 mg Oral TID WC Vance Gather B, MD      ? sodium chloride flush (NS) 0.9 % injection 3 mL  3 mL Intravenous Q12H Patrecia Pour, MD   3 mL at 06/01/21 4327  ? vitamin B-12 (CYANOCOBALAMIN) tablet 100 mcg  100 mcg Oral Daily Patrecia Pour, MD   100 mcg at 06/01/21 6147  ? ? ? ?Discharge Medications: ?Please see discharge summary for a list of discharge medications. ? ?Relevant Imaging Results: ? ?Relevant Lab Results: ? ? ?Additional Information ?Dialysis TTS ? ?Salome Arnt, LCSW ? ? ? ? ?

## 2021-06-01 NOTE — Progress Notes (Signed)
Initial Nutrition Assessment ? ?DOCUMENTATION CODES:  ? ?  ? ?INTERVENTION:  ?Nepro Shake po daily  ? ?Renal vitamin ? ?Dysphagia 2 diet  ? ?NUTRITION DIAGNOSIS:  ? ?Increased nutrient needs related to chronic illness (ESRD-HD) as evidenced by estimated needs. ? ? ?GOAL:  ?Patient will meet greater than or equal to 90% of their needs ? ? ?MONITOR:  ?PO intake, Supplement acceptance, Labs, I & O's, Weight trends ? ?REASON FOR ASSESSMENT:  ? ?Malnutrition Screening Tool ?  ? ?ASSESSMENT: Patient is an 81 yo female with hx of ESRD-HD, Anemia, DM2, CKD-4, COPD, CHF and HTN. Presents from SNF with altered mental status, sepsis (PNA), acute respiratory failure, acute osteomyelitis of left foot. Recent amputation of  left great toe.  ? ?Dysphagia 2 diet. Patient has upper/lower dentures. Niece is bedside. Patient ate 75-100% of lunch. Assisted with feeding. Family reports decline in patient status since January. Appetite has not been as good and she was hospitalized in January and February and discharged to [facility] per niece.   ? ?Patient having intermittent vomiting episodes per RTT.  ? ?4/18 Post Hemodialysis-Net UF-3.5 liters.  ?Estimated dry wt (EDW)- 82 kg.  ?Pre-HD 92 kg ?Post- HD 88.5 kg ?Weight encounters reviewed. ? ? ?Intake/Output Summary (Last 24 hours) at 06/01/2021 1435 ?Last data filed at 06/01/2021 1325 ?Gross per 24 hour  ?Intake 554.72 ml  ?Output 3500 ml  ?Net -2945.28 ml  ?  ?Medications: ferrous sulfate, Renvela, Solumedrol, Lipitor, B-12. ? ?Labs: ? ?  Latest Ref Rng & Units 06/01/2021  ?  4:20 AM 05/31/2021  ?  4:12 PM 05/31/2021  ? 12:36 PM  ?BMP  ?Glucose 70 - 99 mg/dL 103   105   106    ?BUN 8 - 23 mg/dL 34   54   50    ?Creatinine 0.44 - 1.00 mg/dL 3.10   4.74   4.42    ?Sodium 135 - 145 mmol/L 136   133   135    ?Potassium 3.5 - 5.1 mmol/L 4.3   4.2   4.2    ?Chloride 98 - 111 mmol/L 99   95   96    ?CO2 22 - 32 mmol/L '23   29   28    '$ ?Calcium 8.9 - 10.3 mg/dL 9.0   9.4   9.9    ?   ? ?NUTRITION -  FOCUSED PHYSICAL EXAM: ?Multiple areas of mild muscle depletions - loss likely multi-factored. ? ? ?Diet Order:   ?Diet Order   ? ?       ?  DIET DYS 2 Room service appropriate? Yes; Fluid consistency: Thin  Diet effective now       ?  ? ?  ?  ? ?  ? ? ?EDUCATION NEEDS:  ?Not appropriate for education at this time ? ?Skin:  Skin Assessment: Reviewed RN Assessment ? ?Last BM:  4/18 Type 6 ? ?Height:  ? ?Ht Readings from Last 1 Encounters:  ?05/31/21 '5\' 7"'$  (1.702 m)  ? ? ?Weight:  ? ?Wt Readings from Last 1 Encounters:  ?06/01/21 89.4 kg  ? ? ?Ideal Body Weight:   61 kg ? ?BMI:  Body mass index is 30.87 kg/m?. ? ?Estimated Nutritional Needs:  ? ?Kcal:  1700-1800 ? ?Protein:  80-90 gr ? ?Fluid:  1200 ml daily ? ?Colman Cater MS,RD,CSG,LDN ?Contact: AMION ?

## 2021-06-01 NOTE — Assessment & Plan Note (Addendum)
Continue ceftriaxone and azithromycin D#5>>cefidinr with azithro ?--received 7 days abx ?-check PCT--1.05 (06/02/21) ?-PCT 0.82 on 4/22 ?

## 2021-06-01 NOTE — Hospital Course (Addendum)
81 y.o. female with medical history significant of stroke, ESRD, COPD on 2L O2, HFpEF, and PAD with recent left great toe osteomyelitis treated with amputation who [facility]. She was picked up at her nursing facility to go to routine HD and became responsive only to sternal rub so was transferred to the ED instead. She was found to be hypoxic and hypercarbic requiring BiPAP. With BiPAP her mentation has improved. She is able to give a limited history, reports a recent history of coughing and feeling unwell generally but denies shortness of breath or chest pain. Denies recent wheezing.  ?  ?CXR demonstrated hypoventilation with streaky bibasilar opacities with enlarged cardiac silhouette and left lower lobe opacity consistent with pneumonia. CT head revealed atrophy and chronic infarcts without acute pathology. Blood cultures were drawn, antibiotics and IV fluids administered, and hospitalists consulted for admission. ? ?Patient improved clinically and was weaned off levophed and weaned back to her baseline 2L.  She was conversant and mentation returned to baseline.   ? ?On evening of 06/03/21, patient was found obtunded with agonal respirations with oxygen saturation in 60% range on her 2L.  Nursing reported pt had a pulse and appropriate BP (no sustained hypotension reported although there is a single BP 83/52 noted).  There was no bradycardia reported during the event though HR went to low 60s. ?Pt was bagged and apparently still "moving around" but there was concerned that with her mental status, she was unable to protect airway>>intubated at [place] [facility].  Apparently with BVM and intubation, her vitals signs improved.  Initial ABG after intubation 7.45/32/239/22 (1.0). ? ?On 06/04/21, sedation was weaned off and SBT was attempted but pt failed---on PS 10/ CPAP 5 with 40%>>The patient's SpO2 dropped to 78%, RR to 4 and Vt 108. She was returned to  [facility] 16, Vt 490, 40% ,PEEP 5.  CT brain  negative ? ?06/05/21--sedation weaned off and had successful SBT>>extubated.  Mental status remained stable after extubation.  She remained hemodynamically stable.   ? ?06/06/21--mental status back to baseline, improved.  Remained hemodynamically stable.  Discussed with neurology, Dr. Hortense Ramal, due concern for occult seizure for her episodes of unresponsiveness.  Plan for EEG today and HD tomorrow (as scheduled).  If EEG is negative and HD is uneventful tomorrow, pt can d/c back to SNF without any AEDs ? ?06/07/21--mental status remains at baseline.  Remains afebrile and hemodynamically stable.  EEG neg for seizure.  Tolerating HD.  Stable for d/c.  No further episodes of unresponsiveness.  Consider AEDs if has another episode ?

## 2021-06-01 NOTE — Assessment & Plan Note (Addendum)
Continue IV steroids>>po prednisone--received 7 days steroids all together ?-continue BDs ?-continue pulmicort ?

## 2021-06-01 NOTE — Evaluation (Signed)
Clinical/Bedside Swallow Evaluation ?Patient Details  ?[name] ?MRN: [id number] ?Date of Birth: 10/02/1940 ? ?Today's Date: 06/01/2021 ?Time: SLP Start Time (ACUTE ONLY): 6270 SLP Stop Time (ACUTE ONLY): 1402 ?SLP Time Calculation (min) (ACUTE ONLY): 20 min ? ?Past Medical History:  ?Past Medical History:  ?Diagnosis Date  ? Anemia in chronic kidney disease (CODE) 06/05/2015  ? Arthritis   ? "bad in my legs" (12/07/2014)  ? Chronic atrial fibrillation ([place]) 06/05/2015  ? Chronic back pain   ? "dr said my spine is crooked"  ? Chronic bronchitis ([place])   ? "get it q yr" (12/07/2014)  ? CKD stage 4 due to type 2 diabetes mellitus ([place]) 06/05/2015  ? Complication of anesthesia   ? headache for 2 days after surgery in May 2019  ? COPD (chronic obstructive pulmonary disease) ([place])   ? Gait difficulty 09/02/2014  ? Hypercholesterolemia   ? Hypertension   ? Neuropathy 2015  ? in legs  ? NSVT (nonsustained ventricular tachycardia) ([place])   ? Sinus brady-tachy syndrome ([place])   ? Stroke [facility]) 05/2014  ? denies residual on 12/07/2014  ? Type II diabetes mellitus ([place])   ? ?Past Surgical History:  ?Past Surgical History:  ?Procedure Laterality Date  ? ABDOMINAL AORTOGRAM W/LOWER EXTREMITY Left 04/06/2021  ? Procedure: ABDOMINAL AORTOGRAM W/LOWER EXTREMITY;  Surgeon: Broadus John, MD;  Location: [facility];  Service: Cardiovascular;  Laterality: Left;  ? ABDOMINAL HYSTERECTOMY  ~ 1970  ? AMPUTATION TOE Left 04/08/2021  ? Procedure: FIRST TOE LEFT FOOT AMPUTATION;  Surgeon: Trula Slade, DPM;  Location: [facility];  Service: Podiatry;  Laterality: Left;  ? APPENDECTOMY  ~ 1970  ? AV FISTULA PLACEMENT Right 07/06/2017  ? Procedure: RIGHT RADIOCEPHALIC ARTERIOVENOUS FISTULA creation;  Surgeon: Conrad Talladega, MD;  Location: [place] [facility];  Service: Vascular;  Laterality: Right;  ? BASCILIC VEIN TRANSPOSITION Right 09/17/2017  ? Procedure: SECOND STAGE BASILIC VEIN TRANSPOSITION RIGHT ARM;  Surgeon: Rosetta Posner, MD;  Location:  [place];  Service: Vascular;  Laterality: Right;  ? COLONOSCOPY N/A 10/28/2017  ? Procedure: COLONOSCOPY;  Surgeon: Rogene [place], MD;  [facility];  Service: Endoscopy;  Laterality: N/A;  ? COLONOSCOPY WITH PROPOFOL N/A 12/29/2019  ? Procedure: COLONOSCOPY WITH PROPOFOL;  Surgeon: Eloise Harman, DO;  [facility];  Service: Endoscopy;  Laterality: N/A;  ? ESOPHAGOGASTRODUODENOSCOPY (EGD) WITH PROPOFOL N/A 10/31/2017  ? Procedure: ESOPHAGOGASTRODUODENOSCOPY (EGD) WITH PROPOFOL;  Surgeon: Rogene [place], MD;  [facility];  Service: Endoscopy;  Laterality: N/A;  ? JOINT REPLACEMENT    ? PERIPHERAL VASCULAR BALLOON ANGIOPLASTY Left 04/06/2021  ? Procedure: PERIPHERAL VASCULAR BALLOON ANGIOPLASTY;  Surgeon: Broadus John, MD;  Location: [facility];  Service: Cardiovascular;  Laterality: Left;  left sfa succesful ?[place] AT Unsuccesful  ? TOTAL KNEE ARTHROPLASTY Right ~ 1986  ? TUMOR EXCISION  ~ 1970  ? "9# in my stomach"  ? ?HPI:  ?Jody [name] is an 81 y.o. female with medical history significant of stroke, ESRD, COPD on 2L O2, HFpEF, and PAD with recent left great toe osteomyelitis treated with amputation who [facility]. She was picked up at her nursing facility to go to routine HD and became responsive only to sternal rub so was transferred to the ED instead. She was found to be hypoxic and hypercarbic requiring BiPAP. With BiPAP her mentation has improved. She is able to give a limited  history, reports a recent history of coughing and feeling unwell generally but denies shortness of breath or chest pain. Denies recent wheezing.      CXR demonstrated hypoventilation with streaky bibasilar opacities with enlarged cardiac silhouette and left lower lobe opacity consistent with pneumonia. CT head revealed atrophy and chronic infarcts without acute pathology. Blood cultures were drawn, antibiotics and IV fluids administered, and  hospitalists consulted for admission.BSE requested.  ?  ?Assessment / Plan / Recommendation  ?Clinical Impression ? BSE completed at bedside. Pt has been seen by SLP service in the past with last BSE completed in Feburary 2023 with recommendation for D2 and thin liquids. Oral motor examination is WNL, Pt with U/L dentures. Pt consumed ice chips, thin water via straw sips, puree, and chopped textures. Pt with impaired lingual movement and mastication with semi solids with resulting lingual residue and need for liquid wash. No overt signs or symptoms of aspiration and no reports of globus. Recommend D2 with thin liquids with aspiration and reflux precautions and feeder assist for all eating/drinking. Pt to take medications whole or crushed as able in puree. No further SLP services indicated at this time. Above to RN. ?SLP Visit Diagnosis: Dysphagia, unspecified (R13.10) ?   ?Aspiration Risk ? Mild aspiration risk  ?  ?Diet Recommendation Dysphagia 2 (Fine chop);Thin liquid  ? ?Liquid Administration via: Cup;Straw ?Medication Administration: Whole meds with puree ?Supervision: Staff to assist with self feeding;Full supervision/cueing for compensatory strategies ?Compensations: Slow rate;Small sips/bites ?Postural Changes: Seated upright at 90 degrees;Remain upright for at least 30 minutes after po intake  ?  ?Other  Recommendations Oral Care Recommendations: Oral care BID;Staff/trained caregiver to provide oral care ?Other Recommendations: Clarify dietary restrictions   ? ?Recommendations for follow up therapy are one component of a multi-disciplinary discharge planning process, led by the attending physician.  Recommendations may be updated based on patient status, additional functional criteria and insurance authorization. ? ?Follow up Recommendations No SLP follow up  ? ? ?  ?Assistance Recommended at Discharge Frequent or constant Supervision/Assistance  ?Functional Status Assessment Patient has not had a recent  decline in their functional status  ?Frequency and Duration    ?  ?  ?   ? ?Prognosis Prognosis for Safe Diet Advancement: Good  ? ?  ? ?Swallow Study   ?General Date of Onset: 05/31/21 ?HPI: Jody [name] is an 80 y.o. female with medical history significant of stroke, ESRD, COPD on 2L O2, HFpEF, and PAD with recent left great toe osteomyelitis treated with amputation who [facility]. She was picked up at her nursing facility to go to routine HD and became responsive only to sternal rub so was transferred to the ED instead. She was found to be hypoxic and hypercarbic requiring BiPAP. With BiPAP her mentation has improved. She is able to give a limited history, reports a recent history of coughing and feeling unwell generally but denies shortness of breath or chest pain. Denies recent wheezing.      CXR demonstrated hypoventilation with streaky bibasilar opacities with enlarged cardiac silhouette and left lower lobe opacity consistent with pneumonia. CT head revealed atrophy and chronic infarcts without acute pathology. Blood cultures were drawn, antibiotics and IV fluids administered, and hospitalists consulted for admission.BSE requested. ?Type of Study: Bedside Swallow Evaluation ?Previous Swallow Assessment: BSE February 2023 D2/thin ?Diet Prior to this Study: Dysphagia 2 (chopped);Thin liquids ?Temperature Spikes Noted: No ?Respiratory Status: Nasal cannula ?History of Recent Intubation: No ?Behavior/Cognition: Alert;Cooperative;Pleasant  mood ?Oral Cavity Assessment: Within Functional Limits ?Oral Care Completed by SLP: Yes ?Oral Cavity - Dentition: Dentures, top;Dentures, bottom ?Vision: Functional for self-feeding ?Self-Feeding Abilities: Needs assist ?Patient Positioning: Upright in bed ?Baseline Vocal Quality: Hoarse ?Volitional Cough: Strong ?Volitional Swallow: Able to elicit  ?  ?Oral/Motor/Sensory Function Overall Oral Motor/Sensory Function: Within functional limits    ?Ice Chips Ice chips: Within functional limits ?Presentation: Spoon   ?Thin Liquid Thin Liquid: Within functional limits ?Presentation: Straw  ?  ?Nectar Thick Nectar Thick Liquid: Not tested   ?Honey Thick H

## 2021-06-01 NOTE — TOC Initial Note (Signed)
Transition of Care (TOC) - Initial/Assessment Note  ? ? ?Patient Details  ?[name] ?MRN: [id number] ?Date of Birth: 1940-11-26 ? ?Transition of Care (TOC) CM/SW Contact:    ?Salome Arnt, LCSW ?Phone Number: ?06/01/2021, 11:06 AM ? ?Clinical Narrative:  Pt admitted due to acute respiratory failure. Pt oriented x2 per chart. She is a resident at [facility]. Per Threasa Beards at facility, okay to return. Pt has been under Medicaid pending. Threasa Beards indicates pt's POA is niece, [name]. Voicemail left for [name] requesting return call. Melanie indicates pt's dialysis is TTS at [facility]. Per MD, anticipate possible d/c tomorrow. TOC will continue to follow.              ? ? ?Expected Discharge Plan: [place] ?Barriers to Discharge: Continued Medical Work up ? ? ?Patient Goals and CMS Choice ?Patient states their goals for this hospitalization and ongoing recovery are:: anticipate return to SNF ?  ?  ? ?Expected Discharge Plan and Services ?Expected Discharge Plan: [place] ?In-house Referral: Clinical Social Work ?  ?  ?Living arrangements for the past 2 months: [place] ?                ?  ?  ?  ?  ?  ?  ?  ?  ?  ?  ? ?Prior Living Arrangements/Services ?Living arrangements for the past 2 months: [place] ?Lives with:: Facility Resident ?Patient language and need for interpreter reviewed:: Yes ?       ?Need for Family Participation in Patient Care: Yes (Comment) ?Care giver support system in place?: Yes (comment) ?  ?Criminal Activity/Legal Involvement Pertinent to Current Situation/Hospitalization: No - Comment as needed ? ?Activities of Daily Living ?Home Assistive Devices/Equipment: Oxygen ?ADL Screening (condition at time of admission) ?Patient's cognitive ability adequate to safely complete daily activities?: No ?Is the patient deaf or have difficulty hearing?: No ?Does the patient have difficulty seeing, even when wearing  glasses/contacts?: No ?Does the patient have difficulty concentrating, remembering, or making decisions?: No ?Patient able to express need for assistance with ADLs?: Yes ?Does the patient have difficulty dressing or bathing?: Yes ?Independently performs ADLs?: No ?Does the patient have difficulty walking or climbing stairs?: Yes ?Weakness of Legs: Both ?Weakness of Arms/Hands: Both ? ?Permission Sought/Granted ?  ?  ?   ?   ?   ?   ? ?Emotional Assessment ?  ?Attitude/Demeanor/Rapport: Unable to Assess ?Affect (typically observed): Unable to Assess ?Orientation: : Oriented to Self, Oriented to Place ?Alcohol / Substance Use: Not Applicable ?Psych Involvement: No (comment) ? ?Admission diagnosis:  Acute respiratory failure with hypercapnia ([place]) [J96.02] ?Unresponsiveness [R41.89] ?Acute hypercapnic respiratory failure ([place]) [J96.02] ?Patient Active Problem List  ? Diagnosis Date Noted  ? Acute respiratory failure with hypercapnia ([place]) 05/31/2021  ? Thrombocytopenia ([place]) 05/31/2021  ? Acute hematogenous osteomyelitis of right foot ([place]) 05/23/2021  ? PAD (peripheral artery disease) ([place]) 05/23/2021  ? Leg pain 04/10/2021  ? Acute metabolic encephalopathy 86/57/8469  ? Sinus bradycardia 04/07/2021  ? Hypokalemia 04/05/2021  ? History of CVA (cerebrovascular accident) 04/04/2021  ? Acute osteomyelitis of left foot ([place]) 04/04/2021  ? Chronic respiratory failure ([place]) 04/04/2021  ? Anemia of chronic kidney failure 04/04/2021  ? Hyponatremia 04/04/2021  ? Memory loss 04/04/2021  ? Pressure injury of skin 03/04/2021  ? Rectal bleeding 12/27/2019  ? NSVT (nonsustained ventricular tachycardia) ([place]) 10/27/2019  ? Megaloblastic anemia 10/26/2019  ? Chronic blood loss anemia 10/26/2017  ?  Iron deficiency anemia 02/23/2017  ? Chronic diastolic CHF (congestive heart failure) ([place]) 10/15/2016  ? Type 2 diabetes mellitus with nephropathy ([place]) 10/15/2016  ? ESRD on dialysis ([place]) 10/15/2016  ? NSTEMI (non-ST elevated myocardial  infarction) ([place]) 10/03/2015  ? COPD with chornic respiratory failure on oxygen 2L [place] 06/05/2015  ? Anemia in chronic kidney disease, Stage IV 06/05/2015  ? Chronic atrial fibrillation ([place]) 06/05/2015  ? TIA (transient ischemic attack) 09/02/2014  ? Gait difficulty 09/02/2014  ? Other specified transient cerebral ischemias   ? Stroke with cerebral ischemia ([place])   ? Hemispheric carotid artery syndrome   ? Essential hypertension   ? ?PCP:  Monico Blitz, MD ?Pharmacy:   ?[facility] - [place] [address] ?[place] [address] ?Phone: [phone] Fax: [phone] ? ? ? ? ?Social Determinants of Health (SDOH) Interventions ?  ? ?Readmission Risk Interventions ? ?  06/01/2021  ? 10:59 AM 12/30/2019  ?  1:03 PM 10/27/2019  ?  1:19 PM  ?Readmission Risk Prevention Plan  ?Transportation Screening Complete Complete Complete  ?PCP or Specialist Appt within 5-7 Days   Complete  ?Home Care Screening   Complete  ?Medication Review (RN CM)   Complete  ?[place] or Home Care Consult  Complete   ?Social Work Consult for [place] Planning/Counseling  Complete   ?Palliative Care Screening  Not Complete   ?Medication Review Press photographer) Complete Complete   ?[place] or Home Care Consult Complete    ?SW Recovery Care/Counseling Consult Complete    ?Palliative Care Screening Not Applicable    ?Skilled Nursing Facility Complete    ? ? ? ?

## 2021-06-01 NOTE — Progress Notes (Signed)
Patient is currently on 5L [place] with sat of 100% after dialysis was completed.  Bipap is at bedside if needed. ?

## 2021-06-01 NOTE — Assessment & Plan Note (Addendum)
Continue apixaban ?Holding metoprolol due to hypotension initially>>restarted 4/21 ?Largely rate controlled ?TSH 1.132 ?

## 2021-06-01 NOTE — Assessment & Plan Note (Addendum)
Due to LLL pneumonia and COPD exacerbation.  ?- initially on BiPAP, admit to SDU. HCPOA has confirmed pt's full code status,  ?-weaned back to 2L on 06/01/21 ?-on 2L [place] at baseline ?06/03/21 1945--intubated due to agonal respirations ?06/05/21-ABG 7.56/32/58/28 (0.35) ?Personally reviewed CXR--increased interstitial markings ?Ammonia = 26 ?4/23--extubated ?4/24--stable on 2L.  Order EEG.  Discussed with neurology ?4/25--stable on 2L.  EEG negative for seizure.  Tolerating HD ? ?

## 2021-06-01 NOTE — Consult Note (Signed)
[facility] ?Renal Consultation Note  ?  ?Indication for Consultation:  Management of ESRD/hemodialysis; anemia, hypertension/volume and secondary hyperparathyroidism ? ?HPI: Jody [name] is a 81 y.o. female with a PMH significant for HTN, DM type 2, h/o SSS, h/o CVA, COPD, HLD, chronic atrial fibrillation, recent osteomyelitis of left foot, and ESRD who presented to [facility] ED on 05/31/21 with altered mental status/unresponsiveness.  She was picked up ater her SNF to go to routine HD when she became unresponsive and transferred to [facility] ED instead.  In the ED she was hypoxic on 15 L oxygen with SpO2 of 82% and was placed on Bipap.  ABG revealed pH 7.23, pO2 337, pCO2 74.  BP 118/87, P 75, temp 97.8.  Labs notable for K 4.2, Na 133, Hgb 9.1, plt 111, INR 1.6, CT scan of head without acute CVA.  CXR revealed focal airspace opacity in the LLL concerning for PNA.  She was admitted for further evaluation and we were consulted to provide urgent HD for UF to improve hypoxia.  She underwent HD yesterday in the ICU while on Bipap, however she became hypotensive and required levophed drip.  She completed a 3.5 hrs HD with UF of 3.5 Liters and was able to be weaned off of bipap.  This morning she is still on levophed but O2 is only 2 liters via [place].  She is more awake and alert this am.  ? ?Past Medical History:  ?Diagnosis Date  ? Anemia in chronic kidney disease (CODE) 06/05/2015  ? Arthritis   ? "bad in my legs" (12/07/2014)  ? Chronic atrial fibrillation ([place]) 06/05/2015  ? Chronic back pain   ? "dr said my spine is crooked"  ? Chronic bronchitis ([place])   ? "get it q yr" (12/07/2014)  ? CKD stage 4 due to type 2 diabetes mellitus ([place]) 06/05/2015  ? Complication of anesthesia   ? headache for 2 days after surgery in May 2019  ? COPD (chronic obstructive pulmonary disease) ([place])   ? Gait difficulty 09/02/2014  ? Hypercholesterolemia   ? Hypertension   ? Neuropathy 2015  ? in legs  ? NSVT (nonsustained ventricular  tachycardia) ([place])   ? Sinus brady-tachy syndrome ([place])   ? Stroke [facility]) 05/2014  ? denies residual on 12/07/2014  ? Type II diabetes mellitus ([place])   ? ?Past Surgical History:  ?Procedure Laterality Date  ? ABDOMINAL AORTOGRAM W/LOWER EXTREMITY Left 04/06/2021  ? Procedure: ABDOMINAL AORTOGRAM W/LOWER EXTREMITY;  Surgeon: Broadus John, MD;  Location: [facility];  Service: Cardiovascular;  Laterality: Left;  ? ABDOMINAL HYSTERECTOMY  ~ 1970  ? AMPUTATION TOE Left 04/08/2021  ? Procedure: FIRST TOE LEFT FOOT AMPUTATION;  Surgeon: Trula Slade, DPM;  Location: [facility];  Service: Podiatry;  Laterality: Left;  ? APPENDECTOMY  ~ 1970  ? AV FISTULA PLACEMENT Right 07/06/2017  ? Procedure: RIGHT RADIOCEPHALIC ARTERIOVENOUS FISTULA creation;  Surgeon: Conrad [place], MD;  Location: [facility];  Service: Vascular;  Laterality: Right;  ? BASCILIC VEIN TRANSPOSITION Right 09/17/2017  ? Procedure: SECOND STAGE BASILIC VEIN TRANSPOSITION RIGHT ARM;  Surgeon: Rosetta Posner, MD;  Location: [facility];  Service: Vascular;  Laterality: Right;  ? COLONOSCOPY N/A 10/28/2017  ? Procedure: COLONOSCOPY;  Surgeon: Rogene Houston, MD;  [facility];  Service: Endoscopy;  Laterality: N/A;  ? COLONOSCOPY WITH PROPOFOL N/A 12/29/2019  ? Procedure: COLONOSCOPY WITH PROPOFOL;  Surgeon: Eloise Harman, DO;  [facility];  Service: Endoscopy;  Laterality: N/A;  ? ESOPHAGOGASTRODUODENOSCOPY (EGD) WITH PROPOFOL N/A 10/31/2017  ? Procedure: ESOPHAGOGASTRODUODENOSCOPY (EGD) WITH PROPOFOL;  Surgeon: Rogene Houston, MD;  [facility];  Service: Endoscopy;  Laterality: N/A;  ? JOINT REPLACEMENT    ? PERIPHERAL VASCULAR BALLOON ANGIOPLASTY Left 04/06/2021  ? Procedure: PERIPHERAL VASCULAR BALLOON ANGIOPLASTY;  Surgeon: Broadus John, MD;  Location: [facility];  Service: Cardiovascular;  Laterality: Left;  left sfa succesful ?[place] AT Unsuccesful  ? TOTAL KNEE ARTHROPLASTY Right ~ 1986  ? TUMOR EXCISION  ~  1970  ? "9# in my stomach"  ? ?Family History:   ?Family History  ?Problem Relation Age of Onset  ? Cancer Other   ? Heart attack Brother   ? Cancer Sister   ?     breast  ? Alcohol abuse Brother   ? ?Social History: ? reports that she quit smoking about 17 years ago. Her smoking use included cigarettes. She started smoking about 68 years ago. She has a 25.00 pack-year smoking history. She has never used smokeless tobacco. She reports that she does not drink alcohol and does not use drugs. ?Allergies  ?Allergen Reactions  ? Codeine Nausea And Vomiting  ? ?Prior to Admission medications   ?Medication Sig Start Date End Date Taking? Authorizing Provider  ?acetaminophen (TYLENOL) 500 MG tablet Take 1 tablet (500 mg total) by mouth 3 (three) times daily. 04/13/21  Yes Regalado, Belkys A, MD  ?albuterol (ACCUNEB) 0.63 MG/3ML nebulizer solution Take 1 ampule by nebulization every 6 (six) hours as needed for wheezing.   Yes [provider]  ?albuterol (PROVENTIL HFA;VENTOLIN HFA) 108 (90 BASE) MCG/ACT inhaler Inhale 1-2 puffs into the lungs every 6 (six) hours as needed for wheezing or shortness of breath. Reported on 03/01/2015   Yes [provider]  ?apixaban (ELIQUIS) 2.5 MG TABS tablet Take 2.5 mg by mouth 2 (two) times daily.   Yes [provider]  ?aspirin EC 81 MG EC tablet Take 1 tablet (81 mg total) by mouth daily. Swallow whole. 04/14/21  Yes Regalado, Belkys A, MD  ?atorvastatin (LIPITOR) 40 MG tablet Take 1 tablet (40 mg total) by mouth daily. 04/14/21  Yes Regalado, Belkys A, MD  ?benzonatate (TESSALON) 200 MG capsule Take 200 mg by mouth 3 (three) times daily as needed for cough.   Yes [provider]  ?ferrous sulfate 325 (65 FE) MG tablet Take 325 mg by mouth 2 (two) times daily with a meal.   Yes [provider]  ?gabapentin (NEURONTIN) 300 MG capsule Take 300 mg by mouth daily. 05/11/21  Yes [provider]  ?metoprolol tartrate (LOPRESSOR) 25 MG tablet Take  0.5 tablets (12.5 mg total) by mouth 2 (two) times daily. 10/18/16  Yes Kathie Dike, MD  ?oxyCODONE (ROXICODONE) 5 MG immediate release tablet Take 1 tablet (5 mg total) by mouth every 6 (six) hours as needed for severe pain. 04/29/21  Yes Trula Slade, DPM  ?pramipexole (MIRAPEX) 0.125 MG tablet Take 0.125 mg by mouth daily.   Yes [provider]  ?sevelamer carbonate (RENVELA) 800 MG tablet Take 800 mg by mouth 3 (three) times daily with meals.  07/22/19  Yes [provider]  ?vitamin B-12 100 MCG tablet Take 1 tablet (100 mcg total) by mouth daily. 04/14/21  Yes Regalado, Belkys A, MD  ?ceFEPIme 2 g in sodium chloride 0.9 % 100 mL Inject 2 g into the vein every Tuesday, Thursday, and Saturday at 6  PM. 04/14/21   Regalado, Jerald Kief A, MD  ?gabapentin (NEURONTIN) 100 MG capsule Take 1 capsule (100 mg total) by mouth at bedtime. ?Patient not taking: Reported on 05/31/2021 04/13/21   Elmarie Shiley, MD  ? ?Current Facility-Administered Medications  ?Medication Dose Route Frequency Provider Last Rate Last Admin  ? 0.9 %  sodium chloride infusion  100 mL Intravenous PRN Donato Heinz, MD      ? 0.9 %  sodium chloride infusion  100 mL Intravenous PRN Donato Heinz, MD      ? 0.9 %  sodium chloride infusion  250 mL Intravenous Continuous Patrecia Pour, MD      ? acetaminophen (TYLENOL) tablet 650 mg  650 mg Oral Q6H PRN Patrecia Pour, MD      ? Or  ? acetaminophen (TYLENOL) suppository 650 mg  650 mg Rectal Q6H PRN Patrecia Pour, MD      ? albumin human 25 % solution 25 g  25 g Intravenous Once Donato Heinz, MD 60 mL/hr at 05/31/21 1715 Restarted at 05/31/21 1715  ? albuterol (PROVENTIL) (2.5 MG/3ML) 0.083% nebulizer solution 3 mL  3 mL Nebulization Q6H PRN Patrecia Pour, MD      ? alteplase (CATHFLO ACTIVASE) injection 2 mg  2 mg Intracatheter Once PRN Donato Heinz, MD      ? apixaban Arne [place]) tablet 2.5 mg  2.5 mg Oral BID Patrecia Pour, MD   2.5 mg at 06/01/21 0948  ? aspirin  EC tablet 81 mg  81 mg Oral Daily Patrecia Pour, MD   81 mg at 06/01/21 0950  ? atorvastatin (LIPITOR) tablet 40 mg  40 mg Oral Daily Patrecia Pour, MD   40 mg at 06/01/21 0946  ? azithromycin (ZITHROMAX) 500

## 2021-06-01 NOTE — Progress Notes (Signed)
?  ?       ?PROGRESS NOTE ? ?Jody [name] JGO:[id number] DOB: 1940/05/07 DOA: 05/31/2021 ?PCP: Monico Blitz, MD ? ?Brief History:  ?History per Dr. Bonner Puna: ? ?81 y.o. female with medical history significant of stroke, ESRD, COPD on 2L O2, HFpEF, and PAD with recent left great toe osteomyelitis treated with amputation who [facility]responsiveness. She was picked up at her nursing facility to go to routine HD and became responsive only to sternal rub so was transferred to the ED instead. She was found to be hypoxic and hypercarbic requiring BiPAP. With BiPAP her mentation has improved. She is able to give a limited history, reports a recent history of coughing and feeling unwell generally but denies shortness of breath or chest pain. Denies recent wheezing.  ?  ?CXR demonstrated hypoventilation with streaky bibasilar opacities with enlarged cardiac silhouette and left lower lobe opacity consistent with pneumonia. CT head revealed atrophy and chronic infarcts without acute pathology. Blood cultures were drawn, antibiotics and IV fluids administered, and hospitalists consulted for admission.  ? ? ? ?Assessment and Plan: ?* Acute on chronic respiratory failure with hypoxia and hypercapnia (HCC) ?Due to LLL pneumonia and COPD exacerbation.  ?- Tx pneumonia and COPD as below ?- Wean FiO2 ?- Continue BiPAP, admit to SDU. HCPOA has confirmed pt's full code status,  ?-weaned back to 2L on 06/01/21 ?-on 2L [place] at baseline ? ?Severe sepsis ([place]) ?Due to pneumonia ?-present on admission ?-presented with tachypnea, tachycardia, hypotension ?-personally reviewed CXR--bibasilar opacity L>R ?-continue ceftriaxone and azithromycin ?-am PCT ? ?Lobar pneumonia ([place]) ?Continue ceftriaxone and azithromycin ?-check PCT ? ?COPD with acute exacerbation ([place]) ?Continue IV steroids ?-start BDs ?-start pulmicort ? ?ESRD on dialysis [facility]) ?Due for routine HD today, last HD was on scheduled 4/15.  ?- Nephrology consulted for  HD ?- last HD 4/18 ? ?Thrombocytopenia ([place]) ?Fairly chronic dating back to Feb 2023 ?-B12 ?-folate ?-TSH ? ?Persistent atrial fibrillation ([place]) ?Continue apixaban ?Holding metoprolol due to hypotension ?Largely rate controlled ? ?PAD (peripheral artery disease) ([place]) ?- Continue statin and ASA ? ?Anemia of chronic kidney failure ?- Monitor ? ?Acute osteomyelitis of left foot ([place]) ?S/p left first ray amputation 04/08/21--Dr. Jacqualyn Posey ?- Pain control meds include oxycodone and gabapentin which has recently been increased to '300mg'$ .  ?- wound looks clean and intact without signs of infection ? ?History of CVA (cerebrovascular accident) ?CT head nonacute, no definite focal deficits on limited exam.  ?- Continue ASA, statin ? ?Iron deficiency anemia ?- Continue iron supplement ? ?Chronic diastolic CHF (congestive heart failure) ([place]) ?Appears clinically euvolemic  ?- Manage volume with HD ? ?Essential hypertension ?Holding metoprolol due to hypotension ? ? ? ? ?Family Communication:   niece updated at bedside 4/19 ? ?Consultants:  renal; palliative ? ?Code Status:  FULL ? ?DVT Prophylaxis:  apixaban ? ? ?Procedures: ?As Listed in Progress Note Above ? ?Antibiotics: ?Ceftriaxone 4/18>> ?Azithro 4/18>> ? ? ?Subjective: ? ?Patient is more alert and follows commands.  She is pleasantly confused.  Denies cp, n/v/d, abd pain.  She has sob. ?Objective: ?Vitals:  ? 06/01/21 1315 06/01/21 1335 06/01/21 1340 06/01/21 1415  ?BP: (!) 94/50 (!) 88/63 (!) 87/49 106/73  ?Pulse:      ?Resp: (!) 21 15 (!) 22 18  ?Temp:      ?TempSrc:      ?SpO2: 95%  98% 95%  ?Weight:      ?Height:      ? ? ?Intake/Output  Summary (Last 24 hours) at 06/01/2021 1432 ?Last data filed at 06/01/2021 1325 ?Gross per 24 hour  ?Intake 554.72 ml  ?Output 3500 ml  ?Net -2945.28 ml  ? ?Weight change:  ?Exam: ? ?General:  Pt is alert, follows commands appropriately, not in acute distress ?HEENT: No icterus, No thrush, No neck mass, [place]/AT ?Cardiovascular: RRR, S1/S2,  no rubs, no gallops ?Respiratory: bibasilar rales.  Bibasilar wheeze ?Abdomen: Soft/+BS, non tender, non distended, no guarding ?Extremities: No edema, No lymphangitis, No petechiae, No rashes, no synovitis ? ? ?Data Reviewed: ?I have personally reviewed following labs and imaging studies ?Basic Metabolic Panel: ?Recent Labs  ?Lab 05/31/21 ?1236 05/31/21 ?1612 06/01/21 ?0420  ?NA 135 133* 136  ?K 4.2 4.2 4.3  ?CL 96* 95* 99  ?CO2 '28 29 23  '$ ?GLUCOSE 106* 105* 103*  ?BUN 50* 54* 34*  ?CREATININE 4.42* 4.74* 3.10*  ?CALCIUM 9.9 9.4 9.0  ?PHOS  --  5.5*  --   ? ?Liver Function Tests: ?Recent Labs  ?Lab 05/31/21 ?1236 05/31/21 ?1612  ?AST 20  --   ?ALT 15  --   ?ALKPHOS 103  --   ?BILITOT 0.5  --   ?PROT 7.3  --   ?ALBUMIN 3.7 3.2*  ? ?No results for input(s): LIPASE, AMYLASE in the last 168 hours. ?Recent Labs  ?Lab 05/31/21 ?1236  ?AMMONIA 24  ? ?Coagulation Profile: ?Recent Labs  ?Lab 05/31/21 ?1236  ?INR 1.6*  ? ?CBC: ?Recent Labs  ?Lab 05/31/21 ?1236 05/31/21 ?1612 06/01/21 ?0420  ?WBC 5.1 5.5 8.2  ?NEUTROABS 4.0  --   --   ?HGB 9.1* 8.8* 9.2*  ?HCT 30.5* 29.6* 32.4*  ?MCV 113.0* 113.4* 119.1*  ?PLT 111* 106* 111*  ? ?Cardiac Enzymes: ?No results for input(s): CKTOTAL, CKMB, CKMBINDEX, TROPONINI in the last 168 hours. ?BNP: ?Invalid input(s): POCBNP ?CBG: ?Recent Labs  ?Lab 05/31/21 ?1019 06/01/21 ?0732  ?GLUCAP 100* 93  ? ?HbA1C: ?No results for input(s): HGBA1C in the last 72 hours. ?Urine analysis: ?   ?Component Value Date/Time  ? Terminous YELLOW 05/27/2014 1902  ? APPEARANCEUR CLEAR 05/27/2014 1902  ? LABSPEC 1.012 05/27/2014 1902  ? PHURINE 5.0 05/27/2014 1902  ? GLUCOSEU NEGATIVE 05/27/2014 1902  ? [place] NEGATIVE 05/27/2014 1902  ? [place] NEGATIVE 05/27/2014 1902  ? [place] Stain NEGATIVE 05/27/2014 1902  ? PROTEINUR 100 (A) 05/27/2014 1902  ? UROBILINOGEN 0.2 05/27/2014 1902  ? NITRITE NEGATIVE 05/27/2014 1902  ? LEUKOCYTESUR NEGATIVE 05/27/2014 1902  ? ?Sepsis  Labs: ?'@LABRCNTIP'$ (procalcitonin:4,lacticidven:4) ?) ?Recent Results (from the past 240 hour(s))  ?Blood Culture (routine x 2)     Status: None (Preliminary result)  ? Collection Time: 05/31/21 10:28 AM  ? Specimen: BLOOD  ?Result Value Ref Range Status  ? Specimen Description BLOOD BLOOD LEFT FOREARM  Final  ? Special Requests   Final  ?  BOTTLES DRAWN AEROBIC AND ANAEROBIC Blood Culture results may not be optimal due to an inadequate volume of blood received in culture bottles  ? Culture   Final  ?  NO GROWTH < 24 HOURS ?Performed at [facility], [address], [place], [place] [address] ?  ? Report Status PENDING  Incomplete  ?Resp Panel by RT-PCR (Flu A&B, Covid) Nasopharyngeal Swab     Status: None  ? Collection Time: 05/31/21 11:28 AM  ? Specimen: Nasopharyngeal Swab; Nasopharyngeal(NP) swabs in vial transport medium  ?Result Value Ref Range Status  ? SARS Coronavirus 2 by RT PCR NEGATIVE NEGATIVE Final  ?  Comment: (NOTE) ?SARS-CoV-2  target nucleic acids are NOT DETECTED. ? ?The SARS-CoV-2 RNA is generally detectable in upper respiratory ?specimens during the acute phase of infection. The lowest ?concentration of SARS-CoV-2 viral copies this assay can detect is ?138 copies/mL. A negative result does not preclude SARS-Cov-2 ?infection and should not be used as the sole basis for treatment or ?other patient management decisions. A negative result may occur with  ?improper specimen collection/handling, submission of specimen other ?than nasopharyngeal swab, presence of viral mutation(s) within the ?areas targeted by this assay, and inadequate number of viral ?copies(<138 copies/mL). A negative result must be combined with ?clinical observations, patient history, and epidemiological ?information. The expected result is Negative. ? ?Fact Sheet for Patients:  ?EntrepreneurPulse.com.au ? ?Fact Sheet for Healthcare Providers:  ?IncredibleEmployment.be ? ?This test is no t yet  approved or cleared by the [place] FDA and  ?has been authorized for detection and/or diagnosis of SARS-CoV-2 by ?FDA under an Emergency Use Authorization (EUA). This EUA will remain  ?in effect (meaning this test can be used) for the duration of

## 2021-06-02 ENCOUNTER — Other Ambulatory Visit (HOSPITAL_COMMUNITY): Payer: Medicare Other

## 2021-06-02 DIAGNOSIS — Z515 Encounter for palliative care: Secondary | ICD-10-CM | POA: Diagnosis not present

## 2021-06-02 DIAGNOSIS — J181 Lobar pneumonia, unspecified organism: Secondary | ICD-10-CM | POA: Diagnosis not present

## 2021-06-02 DIAGNOSIS — R6520 Severe sepsis without septic shock: Secondary | ICD-10-CM

## 2021-06-02 DIAGNOSIS — N189 Chronic kidney disease, unspecified: Secondary | ICD-10-CM

## 2021-06-02 DIAGNOSIS — I4819 Other persistent atrial fibrillation: Secondary | ICD-10-CM

## 2021-06-02 DIAGNOSIS — Z7189 Other specified counseling: Secondary | ICD-10-CM | POA: Diagnosis not present

## 2021-06-02 DIAGNOSIS — I5032 Chronic diastolic (congestive) heart failure: Secondary | ICD-10-CM | POA: Diagnosis not present

## 2021-06-02 DIAGNOSIS — J441 Chronic obstructive pulmonary disease with (acute) exacerbation: Secondary | ICD-10-CM | POA: Diagnosis not present

## 2021-06-02 DIAGNOSIS — A419 Sepsis, unspecified organism: Secondary | ICD-10-CM

## 2021-06-02 DIAGNOSIS — J9621 Acute and chronic respiratory failure with hypoxia: Secondary | ICD-10-CM | POA: Diagnosis not present

## 2021-06-02 LAB — FOLATE: Folate: 7 ng/mL (ref >5.9)

## 2021-06-02 LAB — CBC
HCT: 27.8 % — ABNORMAL LOW (ref 36.0–46.0)
Hemoglobin: 8 g/dL — ABNORMAL LOW (ref 12.0–15.0)
MCH: 32.4 pg (ref 26.0–34.0)
MCHC: 28.8 g/dL — ABNORMAL LOW (ref 30.0–36.0)
MCV: 112.6 fL — ABNORMAL HIGH (ref 80.0–100.0)
Platelets: 102 10*3/uL — ABNORMAL LOW (ref 150–400)
RBC: 2.47 MIL/uL — ABNORMAL LOW (ref 3.87–5.11)
RDW: 16.9 % — ABNORMAL HIGH (ref 11.5–15.5)
WBC: 7.3 10*3/uL (ref 4.0–10.5)
nRBC: 1.1 % — ABNORMAL HIGH (ref 0.0–0.2)

## 2021-06-02 LAB — VITAMIN B12: Vitamin B-12: 1435 pg/mL — ABNORMAL HIGH (ref 180–914)

## 2021-06-02 LAB — PROCALCITONIN: Procalcitonin: 1.05 ng/mL

## 2021-06-02 LAB — TSH: TSH: 1.132 u[IU]/mL (ref 0.350–4.500)

## 2021-06-02 LAB — T4, FREE: Free T4: 0.84 ng/dL (ref 0.61–1.12)

## 2021-06-02 MED ORDER — FOLIC ACID 1 MG PO TABS
1.0000 mg | ORAL_TABLET | Freq: Every day | ORAL | Status: DC
  Administered 2021-06-02 – 2021-06-03 (×2): 1 mg via ORAL
  Filled 2021-06-02 (×2): qty 1

## 2021-06-02 NOTE — Consult Note (Signed)
? ?                                                                                ?Consultation Note ?Date: 06/02/2021  ? ?Patient [name]  ?DOB: May 16, 1940  MRN: [id number]  Age / Sex: 81 y.o., female  ?PCP: Monico Blitz, MD ?Referring Physician: Orson Eva, MD ? ?Reason for Consultation: Establishing goals of care ? ?HPI/Patient Profile: 81 y.o. female  with past medical history of stroke, ESRD on HD, COPD on 2L oxygen, HFpEF, PAD, recent left great toe osteomyelitis s/p amputation, anemia of chronic disease, diabetes admitted on 05/31/2021 due to being unresponsive and found to be hypoxic and hypercarbic requiring BiPAP with sepsis LLL pneumonia and COPD exacerbation.  ? ?Clinical Assessment and Goals of Care: ?I met today with Jody [name]. She is more alert and interactive. Her personality is shining through as she makes jokes and is in good spirits. Her niece Jody [name] is at bedside. I verified with Jody [name] who is HCPOA and Jody [name] shares that Jody [name] is HCPOA. Jody [name] verifies that Jody [name] is her POA and who she would want to make decisions for her. I spoke with them about Jody [name] baseline. She is more confused today than baseline. She is weak and unable to walk although says that she wants to get stronger. She has chronic pain in legs (neuropathic pain) although poor historian about her pain history and how good or bad this is after her toe amputation. I attempted to ask Jody [name] about her desire for resuscitation and she indicates to me that she would not want these measures. Jody [name] shares that she told the doctor yesterday that she wanted everything done.  ? ?I reached out to Jody [name] to further discuss goals of care. We reviewed Jody [name] complicated co-morbidities and these leave her at very high risk for further decline. She is having physical decline and not even really able to feed herself. With all of her health problems and declining quality of life I explain the importance of ongoing  conversations and considerations of what could happen in the future. Jody [name] shares that her aunt has always expressed desire for full measures but would not want to be prolonged on machines. I did explain to Jody [name] that as Jody [name] declines that these measures would not be expected to be successful and it is important to consider the risks vs benefits of what we put her through. Also need to ensure that this is still her wish - often as people's health and quality of life changes their views on what they are willing to go through to prolong life can change. Jody [name] expresses understanding. Jody [name] tells me that she is available to come and discuss and meet together at any time as long as we plan ahead with her.  ? ?All questions/concerns addressed. Emotional support provided.  ? ?**There are some psychosocial issues and family conflict. I provided Jody [name] with fax to send her POA paperwork and Living Will. After discussed Jody [name] requests that we help complete HCPOA to ensure she has the correct documents. I have requested spiritual care assistance as Jody [name] did clearly express her desire for Jody [name] to make her  medical decisions.  ? ?Primary Decision Maker ?HCPOA? Jody [name] ?  ? ?SUMMARY OF RECOMMENDATIONS   ?- Full code requested at this time ?- Will need ongoing goals of care discussions ? ?Code Status/Advance Care Planning: ?Full code ? ? ?Symptom Management:  ?Per attending.  ? ?Prognosis:  ?Overall prognosis poor given severe co-morbidities and declining functional status.  ? ?Discharge Planning: Return to facility with palliative to follow.  ? ?  ? ?Primary Diagnoses: ?Present on Admission: ? Essential hypertension ? Chronic diastolic CHF (congestive heart failure) ([place]) ? Iron deficiency anemia ? Anemia of chronic kidney failure ? PAD (peripheral artery disease) ([place]) ? Acute osteomyelitis of left foot ([place]) ? ? ?I have reviewed the medical record, interviewed the patient and family, and examined the patient. The  following aspects are pertinent. ? ?Past Medical History:  ?Diagnosis Date  ? Anemia in chronic kidney disease (CODE) 06/05/2015  ? Arthritis   ? "bad in my legs" (12/07/2014)  ? Chronic atrial fibrillation ([place]) 06/05/2015  ? Chronic back pain   ? "dr said my spine is crooked"  ? Chronic bronchitis ([place])   ? "get it q yr" (12/07/2014)  ? CKD stage 4 due to type 2 diabetes mellitus ([place]) 06/05/2015  ? Complication of anesthesia   ? headache for 2 days after surgery in May 2019  ? COPD (chronic obstructive pulmonary disease) ([place])   ? Gait difficulty 09/02/2014  ? Hypercholesterolemia   ? Hypertension   ? Neuropathy 2015  ? in legs  ? NSVT (nonsustained ventricular tachycardia) ([place])   ? Sinus brady-tachy syndrome ([place])   ? Stroke [facility]) 05/2014  ? denies residual on 12/07/2014  ? Type II diabetes mellitus ([place])   ? ?Social History  ? ?Socioeconomic History  ? Marital status: Widowed  ?  Spouse name: Not on file  ? Number of children: 0  ? Years of education: 71  ? Highest education level: Not on file  ?Occupational History  ? Occupation: retired  ?Tobacco Use  ? Smoking status: Former  ?  Packs/day: 0.50  ?  Years: 50.00  ?  Pack years: 25.00  ?  Types: Cigarettes  ?  Start date: 03/07/1953  ?  Quit date: 01/14/2004  ?  Years since quitting: 17.3  ? Smokeless tobacco: Never  ? Tobacco comments:  ?  "quit smoking cigarettes  in ~ 2005"  ?Vaping Use  ? Vaping Use: Never used  ?Substance and Sexual Activity  ? Alcohol use: No  ?  Alcohol/week: 0.0 standard drinks  ?  Comment: 10/242016 "quit drinking beer in ~ 1977"  ? Drug use: No  ? Sexual activity: Never  ?Other Topics Concern  ? Not on file  ?Social History Narrative  ? Patient drinks caffeine a few times a week.  ? Patient is right handed.  ? ?Social Determinants of Health  ? ?Financial Resource Strain: Not on file  ?Food Insecurity: Not on file  ?Transportation Needs: Not on file  ?Physical Activity: Not on file  ?Stress: Not on file  ?Social Connections: Not on file   ? ?Family History  ?Problem Relation Age of Onset  ? Cancer Other   ? Heart attack Brother   ? Cancer Sister   ?     breast  ? Alcohol abuse Brother   ? ?Scheduled Meds: ? apixaban  2.5 mg Oral BID  ? aspirin EC  81 mg Oral Daily  ? atorvastatin  40 mg Oral Daily  ? budesonide (PULMICORT) nebulizer  solution  0.5 mg Nebulization BID  ? Chlorhexidine Gluconate Cloth  6 each Topical Q0600  ? darbepoetin (ARANESP) injection - DIALYSIS  60 mcg Intravenous Q Wed-HD  ? feeding supplement (NEPRO CARB STEADY)  237 mL Oral BID BM  ? ferrous sulfate  325 mg Oral BID WC  ? ipratropium  0.5 mg Nebulization Q6H  ? levalbuterol  0.63 mg Nebulization Q6H  ? methylPREDNISolone (SOLU-MEDROL) injection  40 mg Intravenous Q12H  ? multivitamin  1 tablet Oral QHS  ? pramipexole  0.125 mg Oral Daily  ? sevelamer carbonate  800 mg Oral TID WC  ? sodium chloride flush  3 mL Intravenous Q12H  ? cyanocobalamin  100 mcg Oral Daily  ? ?Continuous Infusions: ? sodium chloride    ? sodium chloride    ? sodium chloride    ? albumin human 60 mL/hr at 05/31/21 1715  ? azithromycin 500 mg (06/01/21 1911)  ? cefTRIAXone (ROCEPHIN)  IV Stopped (06/01/21 1310)  ? norepinephrine (LEVOPHED) Adult infusion Stopped (06/02/21 0443)  ? ?PRN Meds:.sodium chloride, sodium chloride, acetaminophen **OR** acetaminophen, albuterol, alteplase, benzonatate, heparin, lidocaine (PF), lidocaine-prilocaine, naLOXone (NARCAN)  injection, ondansetron **OR** ondansetron (ZOFRAN) IV, pentafluoroprop-tetrafluoroeth ?Allergies  ?Allergen Reactions  ? Codeine Nausea And Vomiting  ? ?Review of Systems  ?Constitutional:  Positive for activity change.  ?Respiratory:  Negative for shortness of breath.   ?Neurological:  Positive for weakness.  ? ?Physical Exam ?Vitals and nursing note reviewed.  ?Constitutional:   ?   General: She is not in acute distress. ?   Appearance: She is ill-appearing.  ?Cardiovascular:  ?   Rate and Rhythm: Normal rate.  ?Pulmonary:  ?   Effort: No  tachypnea, accessory muscle usage or respiratory distress.  ?Abdominal:  ?   Palpations: Abdomen is soft.  ?Neurological:  ?   Mental Status: She is alert. She is confused.  ? ? ?Vital Signs: BP (!) 110/59   Pulse

## 2021-06-02 NOTE — Progress Notes (Signed)
?  ?       ?PROGRESS NOTE ? ?Jody [name] ZOX:[id number] DOB: Dec 30, 1940 DOA: 05/31/2021 ?PCP: Monico Blitz, MD ? ?Brief History:  ?History per Dr. Bonner Puna: ? ?81 y.o. female with medical history significant of stroke, ESRD, COPD on 2L O2, HFpEF, and PAD with recent left great toe osteomyelitis treated with amputation who presented to the [facility] due to unresponsiveness. She was picked up at her nursing facility to go to routine HD and became responsive only to sternal rub so was transferred to the ED instead. She was found to be hypoxic and hypercarbic requiring BiPAP. With BiPAP her mentation has improved. She is able to give a limited history, reports a recent history of coughing and feeling unwell generally but denies shortness of breath or chest pain. Denies recent wheezing.  ?  ?CXR demonstrated hypoventilation with streaky bibasilar opacities with enlarged cardiac silhouette and left lower lobe opacity consistent with pneumonia. CT head revealed atrophy and chronic infarcts without acute pathology. Blood cultures were drawn, antibiotics and IV fluids administered, and hospitalists consulted for admission.  ? ? ? ?Assessment and Plan: ?* Acute on chronic respiratory failure with hypoxia and hypercapnia (HCC) ?Due to LLL pneumonia and COPD exacerbation.  ?- Tx pneumonia and COPD as below ?- Wean FiO2 ?- initially on BiPAP, admit to SDU. HCPOA has confirmed pt's full code status,  ?-weaned back to 2L on 06/01/21 ?-on 2L  at baseline ? ?Severe sepsis ([place]) ?Due to pneumonia ?-present on admission ?-presented with tachypnea, tachycardia, hypotension ?-personally reviewed CXR--bibasilar opacity L>R ?-continue ceftriaxone and azithromycin ?-PCT--1.05 ?-06/02/21 AM--weaned off levophed ? ?Lobar pneumonia ([place]) ?Continue ceftriaxone and azithromycin ?-check PCT--1.05 ? ?COPD with acute exacerbation ([place]) ?Continue IV steroids ?-continue BDs ?-continue pulmicort ? ?ESRD on dialysis [facility]) ?Due for routine HD today, last HD  was on scheduled 4/15.  ?- Nephrology consulted for HD ?- last HD 4/19>>3L removed UF ? ?Thrombocytopenia ([place]) ?Fairly chronic dating back to Feb 2023 ?-B12--1435 ?-folate-7.0 ?-TSH--1.132 ?-overall stable ? ?Persistent atrial fibrillation ([place]) ?Continue apixaban ?Holding metoprolol due to hypotension ?Largely rate controlled ? ?PAD (peripheral artery disease) ([place]) ?- Continue statin and ASA ? ?Anemia of chronic kidney failure ?- baseline Hgb 8-9 ? ?Acute osteomyelitis of left foot ([place]) ?S/p left first ray amputation 04/08/21--Dr. Jacqualyn Posey ?- Pain control meds include oxycodone and gabapentin which has recently been increased to '300mg'$ .  ?- wound looks clean and intact without signs of infection ? ?History of CVA (cerebrovascular accident) ?CT head nonacute, no definite focal deficits on limited exam.  ?- Continue ASA, statin ? ?Iron deficiency anemia ?- Continue iron supplement ? ?Chronic diastolic CHF (congestive heart failure) ([employer]) ?Appears clinically euvolemic  ?- Manage volume with HD ? ?Essential hypertension ?Holding metoprolol due to hypotension ? ?Pericardial rub? ?-personally reviewed EKG--afib, no ST elevation ?-Echo ? ?Family Communication:   niece updated at bedside 4/19 ?  ?Consultants:  renal; palliative ?  ?Code Status:  FULL ?  ?DVT Prophylaxis:  apixaban ?  ?  ?Procedures: ?As Listed in Progress Note Above ?  ?Antibiotics: ?Ceftriaxone 4/18>> ?Azithro 4/18>> ? ? ? ? ?Subjective: ?Patient denies fevers, chills, headache, chest pain, dyspnea, nausea, vomiting, diarrhea, abdominal pain, dysuria,  ? ? ?Objective: ?Vitals:  ? 06/02/21 1000 06/02/21 1030 06/02/21 1100 06/02/21 1200  ?BP: 103/71 103/82 118/61 125/78  ?Pulse:  64 (!) 114 (!) 49  ?Resp:   20 14  ?Temp:   98.4 ?F (36.9 ?C)   ?TempSrc:   Oral   ?SpO2:  99% 100% 95%  ?Weight:      ?Height:      ? ? ?Intake/Output Summary (Last 24 hours) at 06/02/2021 1457 ?Last data filed at 06/02/2021 0017 ?Gross per 24 hour  ?Intake 170 ml  ?Output 3000 ml   ?Net -2830 ml  ? ?Weight change: 5.031 kg ?Exam: ? ?General:  Pt is alert, follows commands appropriately, not in acute distress ?HEENT: No icterus, No thrush, No neck mass, [place]/AT ?Cardiovascular: IRRR, S1/S2, no rubs, no gallops ?Respiratory: bibasilar rales. No wheeze ?Abdomen: Soft/+BS, non tender, non distended, no guarding ?Extremities: No edema, No lymphangitis, No petechiae, No rashes, no synovitis ? ? ?Data Reviewed: ?I have personally reviewed following labs and imaging studies ?Basic Metabolic Panel: ?Recent Labs  ?Lab 05/31/21 ?1236 05/31/21 ?1612 06/01/21 ?0420  ?NA 135 133* 136  ?K 4.2 4.2 4.3  ?CL 96* 95* 99  ?CO2 '28 29 23  '$ ?GLUCOSE 106* 105* 103*  ?BUN 50* 54* 34*  ?CREATININE 4.42* 4.74* 3.10*  ?CALCIUM 9.9 9.4 9.0  ?PHOS  --  5.5*  --   ? ?Liver Function Tests: ?Recent Labs  ?Lab 05/31/21 ?1236 05/31/21 ?1612  ?AST 20  --   ?ALT 15  --   ?ALKPHOS 103  --   ?BILITOT 0.5  --   ?PROT 7.3  --   ?ALBUMIN 3.7 3.2*  ? ?No results for input(s): LIPASE, AMYLASE in the last 168 hours. ?Recent Labs  ?Lab 05/31/21 ?1236  ?AMMONIA 24  ? ?Coagulation Profile: ?Recent Labs  ?Lab 05/31/21 ?1236  ?INR 1.6*  ? ?CBC: ?Recent Labs  ?Lab 05/31/21 ?1236 05/31/21 ?1612 06/01/21 ?0420 06/02/21 ?0415  ?WBC 5.1 5.5 8.2 7.3  ?NEUTROABS 4.0  --   --   --   ?HGB 9.1* 8.8* 9.2* 8.0*  ?HCT 30.5* 29.6* 32.4* 27.8*  ?MCV 113.0* 113.4* 119.1* 112.6*  ?PLT 111* 106* 111* 102*  ? ?Cardiac Enzymes: ?No results for input(s): CKTOTAL, CKMB, CKMBINDEX, TROPONINI in the last 168 hours. ?BNP: ?Invalid input(s): POCBNP ?CBG: ?Recent Labs  ?Lab 05/31/21 ?1019 06/01/21 ?0732  ?GLUCAP 100* 93  ? ?HbA1C: ?No results for input(s): HGBA1C in the last 72 hours. ?Urine analysis: ?   ?Component Value Date/Time  ? [place] YELLOW 05/27/2014 1902  ? APPEARANCEUR CLEAR 05/27/2014 1902  ? LABSPEC 1.012 05/27/2014 1902  ? PHURINE 5.0 05/27/2014 1902  ? GLUCOSEU NEGATIVE 05/27/2014 1902  ? [place] NEGATIVE 05/27/2014 1902  ? [place] NEGATIVE  05/27/2014 1902  ? [place] Stain NEGATIVE 05/27/2014 1902  ? PROTEINUR 100 (A) 05/27/2014 1902  ? UROBILINOGEN 0.2 05/27/2014 1902  ? NITRITE NEGATIVE 05/27/2014 1902  ? LEUKOCYTESUR NEGATIVE 05/27/2014 1902  ? ?Sepsis Labs: ?'@LABRCNTIP'$ (procalcitonin:4,lacticidven:4) ?) ?Recent Results (from the past 240 hour(s))  ?Blood Culture (routine x 2)     Status: None (Preliminary result)  ? Collection Time: 05/31/21 10:28 AM  ? Specimen: BLOOD  ?Result Value Ref Range Status  ? Specimen Description BLOOD BLOOD LEFT FOREARM  Final  ? Special Requests   Final  ?  BOTTLES DRAWN AEROBIC AND ANAEROBIC Blood Culture results may not be optimal due to an inadequate volume of blood received in culture bottles  ? Culture   Final  ?  NO GROWTH < 24 HOURS ?Performed at [facility], [address] [place], [place] [address] ?  ? Report Status PENDING  Incomplete  ?Resp Panel by RT-PCR (Flu A&B, Covid) Nasopharyngeal Swab     Status: None  ? Collection Time: 05/31/21 11:28 AM  ? Specimen: Nasopharyngeal Swab;  Nasopharyngeal(NP) swabs in vial transport medium  ?Result Value Ref Range Status  ? SARS Coronavirus 2 by RT PCR NEGATIVE NEGATIVE Final  ?  Comment: (NOTE) ?SARS-CoV-2 target nucleic acids are NOT DETECTED. ? ?The SARS-CoV-2 RNA is generally detectable in upper respiratory ?specimens during the acute phase of infection. The lowest ?concentration of SARS-CoV-2 viral copies this assay can detect is ?138 copies/mL. A negative result does not preclude SARS-Cov-2 ?infection and should not be used as the sole basis for treatment or ?other patient management decisions. A negative result may occur with  ?improper specimen collection/handling, submission of specimen other ?than nasopharyngeal swab, presence of viral mutation(s) within the ?areas targeted by this assay, and inadequate number of viral ?copies(<138 copies/mL). A negative result must be combined with ?clinical observations, patient history, and epidemiological ?information. The  expected result is Negative. ? ?Fact Sheet for Patients:  ?EntrepreneurPulse.com.au ? ?Fact Sheet for Healthcare Providers:  ?IncredibleEmployment.be ? ?This test is no t yet a

## 2021-06-02 NOTE — Progress Notes (Signed)
Patient ID: Jody [name], female   DOB: 12/02/1940, 81 y.o.   MRN: [id number] ?S: Doesn't feel well today.  Wants to get out of bed. ?O:BP (!) 110/59   Pulse 80   Temp 98.2 ?F (36.8 ?C) (Axillary)   Resp (!) 23   Ht '5\' 7"'$  (1.702 m)   Wt 87.5 kg   SpO2 100%   BMI 30.21 kg/m?  ? ?Intake/Output Summary (Last 24 hours) at 06/02/2021 1010 ?Last data filed at 06/02/2021 2633 ?Gross per 24 hour  ?Intake 425.75 ml  ?Output 3000 ml  ?Net -2574.25 ml  ? ?Intake/Output: ?I/O last 3 completed shifts: ?In: 407 [P.O.:120; I.V.:137; IV Piggyback:150] ?Out: 6500 [Other:6500] ? Intake/Output this shift: ? Total I/O ?In: 120 [P.O.:120] ?Out: -  ?Weight change: 5.031 kg ?Gen:NAD ?CVS: IRR IRR with loud friction rub throughout precordium ?Resp:occ rhonchi ?Abd:+BS, soft, NT/ND ?Ext:no edema, RUE AVF +T/B ? ?Recent Labs  ?Lab 05/31/21 ?1236 05/31/21 ?1612 06/01/21 ?0420  ?NA 135 133* 136  ?K 4.2 4.2 4.3  ?CL 96* 95* 99  ?CO2 '28 29 23  '$ ?GLUCOSE 106* 105* 103*  ?BUN 50* 54* 34*  ?CREATININE 4.42* 4.74* 3.10*  ?ALBUMIN 3.7 3.2*  --   ?CALCIUM 9.9 9.4 9.0  ?PHOS  --  5.5*  --   ?AST 20  --   --   ?ALT 15  --   --   ? ?Liver Function Tests: ?Recent Labs  ?Lab 05/31/21 ?1236 05/31/21 ?1612  ?AST 20  --   ?ALT 15  --   ?ALKPHOS 103  --   ?BILITOT 0.5  --   ?PROT 7.3  --   ?ALBUMIN 3.7 3.2*  ? ?No results for input(s): LIPASE, AMYLASE in the last 168 hours. ?Recent Labs  ?Lab 05/31/21 ?1236  ?AMMONIA 24  ? ?CBC: ?Recent Labs  ?Lab 05/31/21 ?1236 05/31/21 ?1612 06/01/21 ?0420 06/02/21 ?0415  ?WBC 5.1 5.5 8.2 7.3  ?NEUTROABS 4.0  --   --   --   ?HGB 9.1* 8.8* 9.2* 8.0*  ?HCT 30.5* 29.6* 32.4* 27.8*  ?MCV 113.0* 113.4* 119.1* 112.6*  ?PLT 111* 106* 111* 102*  ? ?Cardiac Enzymes: ?No results for input(s): CKTOTAL, CKMB, CKMBINDEX, TROPONINI in the last 168 hours. ?CBG: ?Recent Labs  ?Lab 05/31/21 ?1019 06/01/21 ?0732  ?GLUCAP 100* 93  ? ? ?Iron Studies: No results for input(s): IRON, TIBC, TRANSFERRIN, FERRITIN in the last 72  hours. ?Studies/Results: ?CT Head Wo Contrast ? ?Result Date: 05/31/2021 ?CLINICAL DATA:  Provided history: Mental status change, unknown cause. Additional history provided: Patient became unresponsive on the way to dialysis. EXAM: CT HEAD WITHOUT CONTRAST TECHNIQUE: Contiguous axial images were obtained from the base of the skull through the vertex without intravenous contrast. RADIATION DOSE REDUCTION: This exam was performed according to the departmental dose-optimization program which includes automated exposure control, adjustment of the mA and/or kV according to patient size and/or use of iterative reconstruction technique. COMPARISON:  Prior head CT examinations 04/12/2021 and earlier. Brain MRI 03/07/2021. FINDINGS: Brain: Mild generalized cerebral atrophy. Redemonstrated chronic cortical/subcortical infarcts within the right parietal and occipital lobes. Background moderate to advanced patchy and ill-defined hypoattenuation within the cerebral white matter, nonspecific but compatible chronic small vessel ischemic disease. There is no acute intracranial hemorrhage. No acute demarcated cortical infarct. No extra-axial fluid collection. No evidence of an intracranial mass. No midline shift. Vascular: No hyperdense vessel. Atherosclerotic calcifications. Skull: Normal. Negative for fracture or focal lesion. Sinuses/Orbits: Visualized orbits show no acute finding. Small mucous retention cyst  within the left maxillary sinus. Other: Trace fluid within the bilateral mastoid air cells. IMPRESSION: No evidence of acute intracranial abnormality. Redemonstrated chronic cortical/subcortical infarcts within the right parietal and occipital lobes. Background moderate-to-advanced chronic small vessel ischemic changes within the cerebral white matter. Mild generalized cerebral atrophy. Small mucous retention cyst within the left sphenoid sinus. Small-volume fluid within the bilateral mastoid air cells. Electronically Signed    By: Kellie Simmering D.O.   On: 05/31/2021 13:58  ? ?DG Chest Port 1 View ? ?Result Date: 05/31/2021 ?CLINICAL DATA:  Decreased responsiveness EXAM: PORTABLE CHEST 1 VIEW COMPARISON:  03/17/2021 FINDINGS: Stable cardiomegaly. Aortic atherosclerosis. Low lung volumes. Mild vascular congestion. Focal airspace opacity in the left lower lobe. Superimposed streaky bibasilar opacities. More reticulonodular markings within the upper lung fields. No pleural effusion or pneumothorax. IMPRESSION: Focal airspace opacity in the left lower lobe concerning for pneumonia. Electronically Signed   By: Davina Poke D.O.   On: 05/31/2021 11:52   ? apixaban  2.5 mg Oral BID  ? aspirin EC  81 mg Oral Daily  ? atorvastatin  40 mg Oral Daily  ? budesonide (PULMICORT) nebulizer solution  0.5 mg Nebulization BID  ? Chlorhexidine Gluconate Cloth  6 each Topical Q0600  ? darbepoetin (ARANESP) injection - DIALYSIS  60 mcg Intravenous Q Wed-HD  ? feeding supplement (NEPRO CARB STEADY)  237 mL Oral BID BM  ? ferrous sulfate  325 mg Oral BID WC  ? ipratropium  0.5 mg Nebulization Q6H  ? levalbuterol  0.63 mg Nebulization Q6H  ? methylPREDNISolone (SOLU-MEDROL) injection  40 mg Intravenous Q12H  ? multivitamin  1 tablet Oral QHS  ? pramipexole  0.125 mg Oral Daily  ? sevelamer carbonate  800 mg Oral TID WC  ? sodium chloride flush  3 mL Intravenous Q12H  ? cyanocobalamin  100 mcg Oral Daily  ? ? ?BMET ?   ?Component Value Date/Time  ? NA 136 06/01/2021 0420  ? K 4.3 06/01/2021 0420  ? CL 99 06/01/2021 0420  ? CO2 23 06/01/2021 0420  ? GLUCOSE 103 (H) 06/01/2021 0420  ? BUN 34 (H) 06/01/2021 0420  ? CREATININE 3.10 (H) 06/01/2021 0420  ? CALCIUM 9.0 06/01/2021 0420  ? GFRNONAA 14 (L) 06/01/2021 0420  ? GFRAA 9 (L) 10/27/2019 0112  ? ?CBC ?   ?Component Value Date/Time  ? WBC 7.3 06/02/2021 0415  ? RBC 2.47 (L) 06/02/2021 0415  ? HGB 8.0 (L) 06/02/2021 0415  ? HCT 27.8 (L) 06/02/2021 0415  ? PLT 102 (L) 06/02/2021 0415  ? MCV 112.6 (H) 06/02/2021  0415  ? MCH 32.4 06/02/2021 0415  ? MCHC 28.8 (L) 06/02/2021 0415  ? RDW 16.9 (H) 06/02/2021 0415  ? LYMPHSABS 0.7 05/31/2021 1236  ? MONOABS 0.3 05/31/2021 1236  ? EOSABS 0.1 05/31/2021 1236  ? BASOSABS 0.0 05/31/2021 1236  ? ? ?Dialysis Orders: Center: [facility]  on TTS . ?EDW 82.5Kg HD Bath 2K/2.5Ca  Time 3:15 Heparin none. Access RUE AVF BFR 400 DFR 500    ?Micera 200 mcg IV every 2 weeks ?  ?Assessment/Plan: ? Acute hypoxic respiratory failure with hypercapnia - due to PNA and COPD exacerbation.  Improved with BiPap and HD with UF.  Currently on 2 liters via [place]. ? Pericardial friction rub - new from yesterday's exam.  Consider ECG and ECHO to further evaluate.  Possibly related to PNA or pericarditis related to infection. May warrant Cardiology evaluation.  ? Sepsis - due to LLL pneumonia.  Currently off of levophed. ? LLL Pneumonia - started on azithromycin, ceftriaxone per primary svc. ? ESRD -  Had HD on 4/18 and 4/19 with UF of 6 liters and improved oxygenation.  Off of schedule.  Will plan for HD tomorrow and Saturday if still here.  She remains 5 kg above edw, however has required pressor support with each HD session.  ? Hypertension/volume  - as above, still 5 kg above edw.  Will plan for session of HD tomorrow and get back on outpatient schedule Saturday.  Hold home bp meds given hypotension in setting of sepsis. ? Anemia  - Hgb stable, dose ESA with HD today. ? Metabolic bone disease -  resume home meds when able to take po. ? Nutrition - renal diet, carb modified, when able. ? ?Donetta Potts, MD ?[facility] ? ? ?

## 2021-06-03 ENCOUNTER — Inpatient Hospital Stay (HOSPITAL_COMMUNITY): Payer: Medicare Other

## 2021-06-03 ENCOUNTER — Telehealth: Payer: Self-pay | Admitting: Podiatry

## 2021-06-03 DIAGNOSIS — N186 End stage renal disease: Secondary | ICD-10-CM | POA: Diagnosis not present

## 2021-06-03 DIAGNOSIS — J9620 Acute and chronic respiratory failure, unspecified whether with hypoxia or hypercapnia: Secondary | ICD-10-CM | POA: Diagnosis not present

## 2021-06-03 DIAGNOSIS — J9621 Acute and chronic respiratory failure with hypoxia: Secondary | ICD-10-CM | POA: Diagnosis not present

## 2021-06-03 DIAGNOSIS — J441 Chronic obstructive pulmonary disease with (acute) exacerbation: Secondary | ICD-10-CM | POA: Diagnosis not present

## 2021-06-03 DIAGNOSIS — I5032 Chronic diastolic (congestive) heart failure: Secondary | ICD-10-CM | POA: Diagnosis not present

## 2021-06-03 DIAGNOSIS — R012 Other cardiac sounds: Secondary | ICD-10-CM

## 2021-06-03 LAB — CBC
HCT: 29.2 % — ABNORMAL LOW (ref 36.0–46.0)
Hemoglobin: 8.3 g/dL — ABNORMAL LOW (ref 12.0–15.0)
MCH: 32.2 pg (ref 26.0–34.0)
MCHC: 28.4 g/dL — ABNORMAL LOW (ref 30.0–36.0)
MCV: 113.2 fL — ABNORMAL HIGH (ref 80.0–100.0)
Platelets: 87 10*3/uL — ABNORMAL LOW (ref 150–400)
RBC: 2.58 MIL/uL — ABNORMAL LOW (ref 3.87–5.11)
RDW: 17.6 % — ABNORMAL HIGH (ref 11.5–15.5)
WBC: 4.7 10*3/uL (ref 4.0–10.5)
nRBC: 3 % — ABNORMAL HIGH (ref 0.0–0.2)

## 2021-06-03 LAB — BLOOD GAS, ARTERIAL
Acid-base deficit: 1 mmol/L (ref 0.0–2.0)
Bicarbonate: 22.2 mmol/L (ref 20.0–28.0)
Drawn by: 22223
FIO2: 100 %
O2 Saturation: 97.7 %
Patient temperature: 37
pCO2 arterial: 32 mmHg (ref 32–48)
pH, Arterial: 7.45 (ref 7.35–7.45)
pO2, Arterial: 239 mmHg — ABNORMAL HIGH (ref 83–108)

## 2021-06-03 LAB — RENAL FUNCTION PANEL
Albumin: 4.2 g/dL (ref 3.5–5.0)
Albumin: 4.1 g/dL (ref 3.5–5.0)
Anion gap: 11 (ref 5–15)
Anion gap: 13 (ref 5–15)
BUN: 46 mg/dL — ABNORMAL HIGH (ref 8–23)
BUN: 50 mg/dL — ABNORMAL HIGH (ref 8–23)
CO2: 28 mmol/L (ref 22–32)
CO2: 26 mmol/L (ref 22–32)
Calcium: 10 mg/dL (ref 8.9–10.3)
Calcium: 9.7 mg/dL (ref 8.9–10.3)
Chloride: 95 mmol/L — ABNORMAL LOW (ref 98–111)
Chloride: 94 mmol/L — ABNORMAL LOW (ref 98–111)
Creatinine, Ser: 3.65 mg/dL — ABNORMAL HIGH (ref 0.44–1.00)
Creatinine, Ser: 3.88 mg/dL — ABNORMAL HIGH (ref 0.44–1.00)
GFR, Estimated: 12 mL/min — ABNORMAL LOW (ref >60)
GFR, Estimated: 11 mL/min — ABNORMAL LOW (ref >60)
Glucose, Bld: 167 mg/dL — ABNORMAL HIGH (ref 70–99)
Glucose, Bld: 248 mg/dL — ABNORMAL HIGH (ref 70–99)
Phosphorus: 4.3 mg/dL (ref 2.5–4.6)
Phosphorus: 4.3 mg/dL (ref 2.5–4.6)
Potassium: 4.2 mmol/L (ref 3.5–5.1)
Potassium: 4 mmol/L (ref 3.5–5.1)
Sodium: 134 mmol/L — ABNORMAL LOW (ref 135–145)
Sodium: 133 mmol/L — ABNORMAL LOW (ref 135–145)

## 2021-06-03 LAB — GLUCOSE, CAPILLARY: Glucose-Capillary: 229 mg/dL — ABNORMAL HIGH (ref 70–99)

## 2021-06-03 LAB — ECHOCARDIOGRAM COMPLETE
AR max vel: 1.29 cm2
AV Area VTI: 1.28 cm2
AV Area mean vel: 1.13 cm2
AV Mean grad: 7.5 mmHg
AV Peak grad: 14.1 mmHg
Ao pk vel: 1.88 m/s
Area-P 1/2: 4.36 cm2
Height: 67 in
MV VTI: 2.35 cm2
P 1/2 time: 522 msec
S' Lateral: 3.3 cm
Weight: 3135.82 oz

## 2021-06-03 LAB — MAGNESIUM: Magnesium: 2.5 mg/dL — ABNORMAL HIGH (ref 1.7–2.4)

## 2021-06-03 LAB — LACTIC ACID, PLASMA
Lactic Acid, Venous: 4.3 mmol/L (ref 0.5–1.9)
Lactic Acid, Venous: 2.2 mmol/L (ref 0.5–1.9)

## 2021-06-03 LAB — SEDIMENTATION RATE: Sed Rate: 2 mm/hr (ref 0–22)

## 2021-06-03 LAB — C-REACTIVE PROTEIN: CRP: 0.6 mg/dL (ref <1.0)

## 2021-06-03 MED ORDER — APIXABAN 2.5 MG PO TABS
2.5000 mg | ORAL_TABLET | Freq: Two times a day (BID) | ORAL | Status: DC
  Administered 2021-06-03 – 2021-06-05 (×4): 2.5 mg
  Filled 2021-06-03 (×4): qty 1

## 2021-06-03 MED ORDER — ETOMIDATE 2 MG/ML IV SOLN
INTRAVENOUS | Status: AC
  Administered 2021-06-03: 20 mg
  Filled 2021-06-03: qty 20

## 2021-06-03 MED ORDER — MIDAZOLAM-SODIUM CHLORIDE 100-0.9 MG/100ML-% IV SOLN
0.5000 mg/h | INTRAVENOUS | Status: DC
  Administered 2021-06-03 – 2021-06-05 (×2): 1 mg/h via INTRAVENOUS
  Filled 2021-06-03 (×2): qty 100

## 2021-06-03 MED ORDER — PRAMIPEXOLE DIHYDROCHLORIDE 0.25 MG PO TABS
0.1250 mg | ORAL_TABLET | Freq: Every day | ORAL | Status: DC
  Administered 2021-06-04: 0.125 mg
  Filled 2021-06-03 (×3): qty 0.5

## 2021-06-03 MED ORDER — ATORVASTATIN CALCIUM 40 MG PO TABS
40.0000 mg | ORAL_TABLET | Freq: Every day | ORAL | Status: DC
  Administered 2021-06-04 – 2021-06-05 (×2): 40 mg
  Filled 2021-06-03 (×2): qty 1

## 2021-06-03 MED ORDER — FENTANYL CITRATE PF 50 MCG/ML IJ SOSY
PREFILLED_SYRINGE | INTRAMUSCULAR | Status: AC
  Filled 2021-06-03: qty 2

## 2021-06-03 MED ORDER — FENTANYL CITRATE (PF) 100 MCG/2ML IJ SOLN
INTRAMUSCULAR | Status: AC
  Filled 2021-06-03: qty 2

## 2021-06-03 MED ORDER — FENTANYL 2500MCG IN NS 250ML (10MCG/ML) PREMIX INFUSION
0.0000 ug/h | INTRAVENOUS | Status: DC
  Administered 2021-06-03: 25 ug/h via INTRAVENOUS
  Filled 2021-06-03: qty 250

## 2021-06-03 MED ORDER — PREDNISONE 20 MG PO TABS
50.0000 mg | ORAL_TABLET | Freq: Every day | ORAL | Status: DC
Start: 2021-06-03 — End: 2021-06-03
  Administered 2021-06-03: 50 mg via ORAL
  Filled 2021-06-03: qty 1

## 2021-06-03 MED ORDER — RENA-VITE PO TABS
1.0000 | ORAL_TABLET | Freq: Every day | ORAL | Status: DC
Start: 2021-06-03 — End: 2021-06-05
  Administered 2021-06-03 – 2021-06-04 (×2): 1
  Filled 2021-06-03 (×2): qty 1

## 2021-06-03 MED ORDER — FERROUS SULFATE 300 (60 FE) MG/5ML PO SYRP
300.0000 mg | ORAL_SOLUTION | Freq: Two times a day (BID) | ORAL | Status: DC
  Administered 2021-06-04 (×2): 300 mg
  Filled 2021-06-03 (×5): qty 5

## 2021-06-03 MED ORDER — FOLIC ACID 1 MG PO TABS
1.0000 mg | ORAL_TABLET | Freq: Every day | ORAL | Status: DC
  Administered 2021-06-04 – 2021-06-05 (×2): 1 mg
  Filled 2021-06-03 (×2): qty 1

## 2021-06-03 MED ORDER — PREDNISONE 10 MG PO TABS
50.0000 mg | ORAL_TABLET | Freq: Every day | ORAL | Status: DC
Start: 2021-06-04 — End: 2021-06-05
  Administered 2021-06-04: 50 mg
  Filled 2021-06-03: qty 1

## 2021-06-03 MED ORDER — METOPROLOL TARTRATE 25 MG PO TABS
12.5000 mg | ORAL_TABLET | Freq: Two times a day (BID) | ORAL | Status: DC
  Administered 2021-06-03: 12.5 mg via ORAL
  Filled 2021-06-03: qty 1

## 2021-06-03 MED ORDER — METOPROLOL TARTRATE 25 MG PO TABS
12.5000 mg | ORAL_TABLET | Freq: Two times a day (BID) | ORAL | Status: DC
  Administered 2021-06-03 – 2021-06-05 (×4): 12.5 mg
  Filled 2021-06-03 (×4): qty 1

## 2021-06-03 MED ORDER — ROCURONIUM BROMIDE 10 MG/ML (PF) SYRINGE
PREFILLED_SYRINGE | INTRAVENOUS | Status: AC
Start: 2021-06-03 — End: 2021-06-03
  Filled 2021-06-03: qty 10

## 2021-06-03 MED ORDER — ASPIRIN 81 MG PO CHEW
81.0000 mg | CHEWABLE_TABLET | Freq: Every day | ORAL | Status: DC
  Administered 2021-06-04 – 2021-06-05 (×2): 81 mg
  Filled 2021-06-03 (×2): qty 1

## 2021-06-03 MED ORDER — SEVELAMER CARBONATE 800 MG PO TABS: 800.0000 mg | ORAL_TABLET | Freq: Three times a day (TID) | ORAL | Status: DC

## 2021-06-03 MED ORDER — VITAMIN B-12 100 MCG PO TABS
100.0000 ug | ORAL_TABLET | Freq: Every day | ORAL | Status: DC
  Administered 2021-06-04: 100 ug
  Filled 2021-06-03 (×3): qty 1

## 2021-06-03 MED ORDER — MIDAZOLAM HCL 2 MG/2ML IJ SOLN
INTRAMUSCULAR | Status: AC
  Filled 2021-06-03: qty 2

## 2021-06-03 MED ORDER — CHLORHEXIDINE GLUCONATE CLOTH 2 % EX PADS
6.0000 | MEDICATED_PAD | Freq: Every day | CUTANEOUS | Status: DC
Start: 2021-06-04 — End: 2021-06-07
  Administered 2021-06-04 – 2021-06-07 (×4): 6 via TOPICAL

## 2021-06-03 NOTE — Progress Notes (Signed)
?  ?       ?PROGRESS NOTE ? ?Jody [name] VOJ:[id number] DOB: [birth year] DOA: 05/31/2021 ?PCP: Monico Blitz, MD ? ?Brief History:  ?History per Dr. Bonner Puna: ? ?81 y.o. female with medical history significant of stroke, ESRD, COPD on 2L O2, HFpEF, and PAD with recent left great toe osteomyelitis treated with amputation who presented to the [facility] due to unresponsiveness. She was picked up at her nursing facility to go to routine HD and became responsive only to sternal rub so was transferred to the ED instead. She was found to be hypoxic and hypercarbic requiring BiPAP. With BiPAP her mentation has improved. She is able to give a limited history, reports a recent history of coughing and feeling unwell generally but denies shortness of breath or chest pain. Denies recent wheezing.  ?  ?CXR demonstrated hypoventilation with streaky bibasilar opacities with enlarged cardiac silhouette and left lower lobe opacity consistent with pneumonia. CT head revealed atrophy and chronic infarcts without acute pathology. Blood cultures were drawn, antibiotics and IV fluids administered, and hospitalists consulted for admission.  ? ? ?Assessment and Plan: ?* Acute on chronic respiratory failure with hypoxia and hypercapnia (HCC) ?Due to LLL pneumonia and COPD exacerbation.  ?- Tx pneumonia and COPD as below ?- Wean FiO2 ?- initially on BiPAP, admit to SDU. HCPOA has confirmed pt's full code status,  ?-weaned back to 2L on 06/01/21 ?-on 2L [place] at baseline ? ?Septic shock ([place]) ?Due to pneumonia ?-present on admission ?-presented with tachypnea, tachycardia, hypotension ?-personally reviewed CXR--bibasilar opacity L>R ?-continue ceftriaxone and azithromycin ?-PCT--1.05 ?-06/02/21 AM--weaned off levophed ?-remains stable off levophed 4/21 ?-sepsis physiology resolved ? ?Lobar pneumonia ([place]) ?Continue ceftriaxone and azithromycin ?-check PCT--1.05 ? ?COPD with acute exacerbation ([place]) ?Continue IV steroids>>po prednisone ?-continue  BDs ?-continue pulmicort ? ?ESRD on dialysis [facility]) ?- Nephrology consulted for HD ?- last HD 4/19>>3L removed UF ?- planning for HD 4/22 ? ?Thrombocytopenia ([place]) ?Fairly chronic dating back to Feb 2023 ?-B12--1435 ?-folate-7.0 ?-TSH--1.132 ?-overall stable ? ?Pericardial friction rub ?I did not hear on exam ?Cardiology consulted ?Personally reviewed EKG, unchanged RBBB ?Plan d/c to SNF if no further work up ? ?Persistent atrial fibrillation ([place]) ?Continue apixaban ?Holding metoprolol due to hypotension initially>>restart 4/21 ?Largely rate controlled ? ?PAD (peripheral artery disease) ([place]) ?- Continue statin and ASA ? ?Anemia of chronic kidney failure ?- baseline Hgb 8-9 ? ?Acute osteomyelitis of left foot ([place]) ?S/p left first ray amputation 04/08/21--Dr. Jacqualyn Posey ?- Pain control meds include oxycodone and gabapentin which has recently been increased to '300mg'$ .  ?- wound looks clean and intact without signs of infection ? ?History of CVA (cerebrovascular accident) ?CT head nonacute, no definite focal deficits on limited exam.  ?- Continue ASA, statin ? ?Iron deficiency anemia ?- Continue iron supplement ? ?Chronic diastolic CHF (congestive heart failure) ([place]) ?Appears clinically euvolemic  ?- Manage volume with HD ? ?Essential hypertension ?Holding metoprolol due to hypotension>>restart 4/21 ? ? ?Family Communication:   niece updated at bedside 4/19 ?  ?Consultants:  renal; palliative ?  ?Code Status:  FULL ?  ?DVT Prophylaxis:  apixaban ?  ?  ?Procedures: ?As Listed in Progress Note Above ?  ?Antibiotics: ?Ceftriaxone 4/18>> ?Azithro 4/18>> ?  ? ? ? ? ? ? ? ? ? ? ?Subjective: ?Patient denies fevers, chills, headache, chest pain, dyspnea, nausea, vomiting, diarrhea, abdominal pain, dysuria ? ?Objective: ?Vitals:  ? 06/03/21 1100 06/03/21 1103 06/03/21 1130 06/03/21 1200  ?BP: 127/72   124/68  ?Pulse: 88  87 (!)  46  ?Resp: '11  15 11  '$ ?Temp:  98.8 ?F (37.1 ?C)    ?TempSrc:  Axillary    ?SpO2: 100% 100% 98% 97%   ?Weight:      ?Height:      ? ? ?Intake/Output Summary (Last 24 hours) at 06/03/2021 1402 ?Last data filed at 06/03/2021 0104 ?Gross per 24 hour  ?Intake --  ?Output 1 ml  ?Net -1 ml  ? ?Weight change: -0.5 kg ?Exam: ? ?General:  Pt is alert, follows commands appropriately, not in acute distress ?HEENT: No icterus, No thrush, No neck mass, [place]/AT ?Cardiovascular: IRRR, S1/S2, no rubs, no gallops ?Respiratory: bibasilar rales. No wheeze ?Abdomen: Soft/+BS, non tender, non distended, no guarding ?Extremities: No edema, No lymphangitis, No petechiae, No rashes, no synovitis ? ? ?Data Reviewed: ?I have personally reviewed following labs and imaging studies ?Basic Metabolic Panel: ?Recent Labs  ?Lab 05/31/21 ?1236 05/31/21 ?1612 06/01/21 ?0420 06/03/21 ?0943  ?NA 135 133* 136 134*  ?K 4.2 4.2 4.3 4.2  ?CL 96* 95* 99 95*  ?CO2 '28 29 23 28  '$ ?GLUCOSE 106* 105* 103* 167*  ?BUN 50* 54* 34* 46*  ?CREATININE 4.42* 4.74* 3.10* 3.65*  ?CALCIUM 9.9 9.4 9.0 10.0  ?PHOS  --  5.5*  --  4.3  ? ?Liver Function Tests: ?Recent Labs  ?Lab 05/31/21 ?1236 05/31/21 ?1612 06/03/21 ?0943  ?AST 20  --   --   ?ALT 15  --   --   ?ALKPHOS 103  --   --   ?BILITOT 0.5  --   --   ?PROT 7.3  --   --   ?ALBUMIN 3.7 3.2* 4.2  ? ?No results for input(s): LIPASE, AMYLASE in the last 168 hours. ?Recent Labs  ?Lab 05/31/21 ?1236  ?AMMONIA 24  ? ?Coagulation Profile: ?Recent Labs  ?Lab 05/31/21 ?1236  ?INR 1.6*  ? ?CBC: ?Recent Labs  ?Lab 05/31/21 ?1236 05/31/21 ?1612 06/01/21 ?0420 06/02/21 ?0415  ?WBC 5.1 5.5 8.2 7.3  ?NEUTROABS 4.0  --   --   --   ?HGB 9.1* 8.8* 9.2* 8.0*  ?HCT 30.5* 29.6* 32.4* 27.8*  ?MCV 113.0* 113.4* 119.1* 112.6*  ?PLT 111* 106* 111* 102*  ? ?Cardiac Enzymes: ?No results for input(s): CKTOTAL, CKMB, CKMBINDEX, TROPONINI in the last 168 hours. ?BNP: ?Invalid input(s): POCBNP ?CBG: ?Recent Labs  ?Lab 05/31/21 ?1019 06/01/21 ?0732  ?GLUCAP 100* 93  ? ?HbA1C: ?No results for input(s): HGBA1C in the last 72 hours. ?Urine analysis: ?    ?Component Value Date/Time  ? [place] YELLOW 05/27/2014 1902  ? APPEARANCEUR CLEAR 05/27/2014 1902  ? LABSPEC 1.012 05/27/2014 1902  ? PHURINE 5.0 05/27/2014 1902  ? GLUCOSEU NEGATIVE 05/27/2014 1902  ? [place] NEGATIVE 05/27/2014 1902  ? [place] NEGATIVE 05/27/2014 1902  ? [place] Stain NEGATIVE 05/27/2014 1902  ? PROTEINUR 100 (A) 05/27/2014 1902  ? UROBILINOGEN 0.2 05/27/2014 1902  ? NITRITE NEGATIVE 05/27/2014 1902  ? LEUKOCYTESUR NEGATIVE 05/27/2014 1902  ? ?Sepsis Labs: ?'@LABRCNTIP'$ (procalcitonin:4,lacticidven:4) ?) ?Recent Results (from the past 240 hour(s))  ?Blood Culture (routine x 2)     Status: None (Preliminary result)  ? Collection Time: 05/31/21 10:28 AM  ? Specimen: BLOOD  ?Result Value Ref Range Status  ? Specimen Description BLOOD BLOOD LEFT FOREARM  Final  ? Special Requests   Final  ?  BOTTLES DRAWN AEROBIC AND ANAEROBIC Blood Culture results may not be optimal due to an inadequate volume of blood received in culture bottles  ? Culture   Final  ?  NO GROWTH 3 DAYS ?Performed at [facility], [address] [place], [place] [address] ?  ? Report Status PENDING  Incomplete  ?Resp Panel by RT-PCR (Flu A&B, Covid) Nasopharyngeal Swab     Status: None  ? Collection Time: 05/31/21 11:28 AM  ? Specimen: Nasopharyngeal Swab; Nasopharyngeal(NP) swabs in vial transport medium  ?Result Value Ref Range Status  ? SARS Coronavirus 2 by RT PCR NEGATIVE NEGATIVE Final  ?  Comment: (NOTE) ?SARS-CoV-2 target nucleic acids are NOT DETECTED. ? ?The SARS-CoV-2 RNA is generally detectable in upper respiratory ?specimens during the acute phase of infection. The lowest ?concentration of SARS-CoV-2 viral copies this assay can detect is ?138 copies/mL. A negative result does not preclude SARS-Cov-2 ?infection and should not be used as the sole basis for treatment or ?other patient management decisions. A negative result may occur with  ?improper specimen collection/handling, submission of specimen other ?than  nasopharyngeal swab, presence of viral mutation(s) within the ?areas targeted by this assay, and inadequate number of viral ?copies(<138 copies/mL). A negative result must be combined with ?clinical observations, patient history, an

## 2021-06-03 NOTE — Progress Notes (Signed)
Patient placed on ventilator around 19:50. Patient appeared to have respiratory failure and code was called around 76. Patient intubated by ER MD.  ?

## 2021-06-03 NOTE — Care Management Important Message (Signed)
Important Message ? ?Patient Details  ?[name] ?MRN: [id number] ?Date of Birth: [birth year] ? ? ?Medicare Important Message Given:  Yes ? ? ? ? ?[name] ?06/03/2021, 2:34 PM ?

## 2021-06-03 NOTE — Progress Notes (Signed)
Patient doing well off BIPAP. Unit still at bedside if needed. ?

## 2021-06-03 NOTE — Progress Notes (Signed)
*  PRELIMINARY RESULTS* ?Echocardiogram ?2D Echocardiogram has been performed. ? ?Jody [name] ?06/03/2021, 9:29 AM ?

## 2021-06-03 NOTE — Assessment & Plan Note (Addendum)
I did not hear on exam ?Discussed with cardiology, Dr. Ross>>no pericardial rub noted ?06/03/21 Echo-EF 50%, septal HK and flattening, no pericardial effusion, severe TR ?Personally reviewed EKG, unchanged RBBB ?ESR 2, CRP 0.6 ?

## 2021-06-03 NOTE — Progress Notes (Signed)
PT Cancellation Note ? ?Patient Details ?[name] ?MRN: [id number] ?DOB: 1941/01/25 ? ? ?Cancelled Treatment:    Reason Eval/Treat Not Completed: PT screened, no needs identified, will sign off.  Patient at baseline. ? ? ?11:26 AM, 06/03/21 ?Lonell Grandchild, MPT ?Physical Therapist with [place] ?[facility] ?[phone] office ?[phone] mobile phone ? ?

## 2021-06-03 NOTE — Progress Notes (Addendum)
Patient ID: Jody [name], female   DOB: 1940-05-26, 81 y.o.   MRN: [id number] ?S: No complaints ?O:BP 131/89   Pulse 71   Temp 98.9 ?F (37.2 ?C) (Axillary)   Resp 16   Ht '5\' 7"'$  (1.702 m)   Wt 88.9 kg   SpO2 97%   BMI 30.70 kg/m?  ? ?Intake/Output Summary (Last 24 hours) at 06/03/2021 0921 ?Last data filed at 06/03/2021 0104 ?Gross per 24 hour  ?Intake --  ?Output 1 ml  ?Net -1 ml  ? ?Intake/Output: ?I/O last 3 completed shifts: ?In: 120 [P.O.:120] ?Out: 1 [Stool:1] ? Intake/Output this shift: ? No intake/output data recorded. ?Weight change: -0.5 kg ?Gen: NAD ?CVS: IRR IRR, pericardial friction rub throughout  ?Resp:occ rhonchi ?Abd: +BS, soft, NT/ND ?Ext: no edema, RUE AVF +T/B ? ?Recent Labs  ?Lab 05/31/21 ?1236 05/31/21 ?1612 06/01/21 ?0420  ?NA 135 133* 136  ?K 4.2 4.2 4.3  ?CL 96* 95* 99  ?CO2 '28 29 23  '$ ?GLUCOSE 106* 105* 103*  ?BUN 50* 54* 34*  ?CREATININE 4.42* 4.74* 3.10*  ?ALBUMIN 3.7 3.2*  --   ?CALCIUM 9.9 9.4 9.0  ?PHOS  --  5.5*  --   ?AST 20  --   --   ?ALT 15  --   --   ? ?Liver Function Tests: ?Recent Labs  ?Lab 05/31/21 ?1236 05/31/21 ?1612  ?AST 20  --   ?ALT 15  --   ?ALKPHOS 103  --   ?BILITOT 0.5  --   ?PROT 7.3  --   ?ALBUMIN 3.7 3.2*  ? ?No results for input(s): LIPASE, AMYLASE in the last 168 hours. ?Recent Labs  ?Lab 05/31/21 ?1236  ?AMMONIA 24  ? ?CBC: ?Recent Labs  ?Lab 05/31/21 ?1236 05/31/21 ?1612 06/01/21 ?0420 06/02/21 ?0415  ?WBC 5.1 5.5 8.2 7.3  ?NEUTROABS 4.0  --   --   --   ?HGB 9.1* 8.8* 9.2* 8.0*  ?HCT 30.5* 29.6* 32.4* 27.8*  ?MCV 113.0* 113.4* 119.1* 112.6*  ?PLT 111* 106* 111* 102*  ? ?Cardiac Enzymes: ?No results for input(s): CKTOTAL, CKMB, CKMBINDEX, TROPONINI in the last 168 hours. ?CBG: ?Recent Labs  ?Lab 05/31/21 ?1019 06/01/21 ?0732  ?GLUCAP 100* 93  ? ? ?Iron Studies: No results for input(s): IRON, TIBC, TRANSFERRIN, FERRITIN in the last 72 hours. ?Studies/Results: ?No results found. ? apixaban  2.5 mg Oral BID  ? aspirin EC  81 mg Oral Daily  ? atorvastatin  40  mg Oral Daily  ? budesonide (PULMICORT) nebulizer solution  0.5 mg Nebulization BID  ? Chlorhexidine Gluconate Cloth  6 each Topical Q0600  ? darbepoetin (ARANESP) injection - DIALYSIS  60 mcg Intravenous Q Wed-HD  ? feeding supplement (NEPRO CARB STEADY)  237 mL Oral BID BM  ? ferrous sulfate  325 mg Oral BID WC  ? folic acid  1 mg Oral Daily  ? ipratropium  0.5 mg Nebulization Q6H  ? levalbuterol  0.63 mg Nebulization Q6H  ? methylPREDNISolone (SOLU-MEDROL) injection  40 mg Intravenous Q12H  ? multivitamin  1 tablet Oral QHS  ? pramipexole  0.125 mg Oral Daily  ? sevelamer carbonate  800 mg Oral TID WC  ? sodium chloride flush  3 mL Intravenous Q12H  ? cyanocobalamin  100 mcg Oral Daily  ? ? ?BMET ?   ?Component Value Date/Time  ? NA 136 06/01/2021 0420  ? K 4.3 06/01/2021 0420  ? CL 99 06/01/2021 0420  ? CO2 23 06/01/2021 0420  ? GLUCOSE 103 (H)  06/01/2021 0420  ? BUN 34 (H) 06/01/2021 0420  ? CREATININE 3.10 (H) 06/01/2021 0420  ? CALCIUM 9.0 06/01/2021 0420  ? GFRNONAA 14 (L) 06/01/2021 0420  ? GFRAA 9 (L) 10/27/2019 0112  ? ?CBC ?   ?Component Value Date/Time  ? WBC 7.3 06/02/2021 0415  ? RBC 2.47 (L) 06/02/2021 0415  ? HGB 8.0 (L) 06/02/2021 0415  ? HCT 27.8 (L) 06/02/2021 0415  ? PLT 102 (L) 06/02/2021 0415  ? MCV 112.6 (H) 06/02/2021 0415  ? MCH 32.4 06/02/2021 0415  ? MCHC 28.8 (L) 06/02/2021 0415  ? RDW 16.9 (H) 06/02/2021 0415  ? LYMPHSABS 0.7 05/31/2021 1236  ? MONOABS 0.3 05/31/2021 1236  ? EOSABS 0.1 05/31/2021 1236  ? BASOSABS 0.0 05/31/2021 1236  ? ? ?Dialysis Orders: Center: [facility]   on TTS . ?EDW 82.5Kg HD Bath 2K/2.5Ca  Time 3:15 Heparin none. Access RUE AVF BFR 400 DFR 500    ?Micera 200 mcg IV every 2 weeks ?  ?Assessment/Plan: ? Acute hypoxic respiratory failure with hypercapnia - due to PNA and COPD exacerbation.  Improved with BiPap and HD with UF.  Currently on 2 liters via [place] which is her baseline. ? Pericardial friction rub - new since admission.  Repeat ECG significantly  different from admission with incomplete RBBB and ST and T-wave changes with possible inferolateral ischemia.  ECHO ordered to further evaluate.  May warrant Cardiology evaluation.  ? Sepsis - due to LLL pneumonia.  Currently off of levophed. ? LLL Pneumonia - started on azithromycin, ceftriaxone per primary svc. ? ESRD -  Had HD on 4/18 and 4/19 with UF of 6 liters and improved oxygenation.  Off of schedule.  Will plan for HD tomorrow to get back on her outpatient schedule.  The census does not allow for another session today.  She remains 6 kg above edw, however has required pressor support with each HD session.  ? Hypertension/volume  - as above, still 5 kg above edw.  Will plan for session of HD tomorrow and get back on outpatient schedule Saturday.  Hold home bp meds given hypotension in setting of sepsis. ? Anemia  - Hgb slowly dropping.  Given Aranesp with HD on 06/01/21.  Transfuse for Hgb <7. ? Metabolic bone disease -  resume home meds when able to take po. ? Nutrition - renal diet, carb modified, when able. ? ?Donetta Potts, MD ?[facility] ? ?[name] will not be seen over the weekend, however her chart will be monitored remotely and any necessary changes will be made.  ?

## 2021-06-03 NOTE — Telephone Encounter (Signed)
Received call from pts nursing facility to reschedule her appt for later in the week for now. Pt is at [facility] in icu with sepsis and pneumonia. I rescheduled it for 4.28 but they will call if pt is still in the hospital. They stated that the staff at [facility] [place] is keeping an eye out for her foot as well.[name] ?

## 2021-06-03 NOTE — Progress Notes (Signed)
Code blue called at 1933, patient found unresponsive. Sats lower 60s. Bipap mask applied. Patient with agonal respirations. Pulse present in lower 60s. Stat ABG ordered. Stat cxray ordered.  ? ?MD present at bedside.  ?Patient intubated 1945 ?Etomidate '20mg'$  ?Roc '100mg'$  ? ?7.5 ETT. Positive color change, 23 lip ? ?[name] called with no answer. ?Pam called and is on her way.  ? ? ? ?

## 2021-06-03 NOTE — ED Provider Notes (Signed)
I was called to a CODE BLUE up in the ICU.  I found the patient to have a pulse but with some agonal breathing and was not following commands.  I reviewed the case with the treating physicians and we elected to proceed with intubation to secure the airway.  Respiratory was in attendance and was able to bag the patient with minimal difficulty.  Patient had adequate blood pressure and oxygen saturation prior to an RSI.  She received etomidate and rocuronium and was intubated with a 7.5 ET tube via glide scope 4.  Patient had good oxygen saturations equal breath sounds and positive CO2 change.  A post intubation chest x-ray will be ordered and followed up by the hospitalist. ? ?Procedure Name: Intubation ?Date/Time: 06/03/2021 11:47 PM ?Performed by: Hayden Rasmussen, MD ?Pre-anesthesia Checklist: Patient identified, Patient being monitored, Emergency Drugs available, Timeout performed and Suction available ?Oxygen Delivery Method: Ambu bag ?Preoxygenation: Pre-oxygenation with 100% oxygen ?Induction Type: Rapid sequence ?Ventilation: Mask ventilation without difficulty ?Laryngoscope Size: Glidescope and 4 ?Grade View: Grade II ?Tube size: 7.5 mm ?Number of attempts: 1 ?Placement Confirmation: ETT inserted through vocal cords under direct vision, CO2 detector and Breath sounds checked- equal and bilateral ?Secured at: 23 cm ?Tube secured with: ETT holder ?Dental Injury: Teeth and Oropharynx as per pre-operative assessment  ? ? ? ? ?  ?Hayden Rasmussen, MD ?06/04/21 [phone] ? ?

## 2021-06-04 ENCOUNTER — Inpatient Hospital Stay (HOSPITAL_COMMUNITY): Payer: Medicare Other

## 2021-06-04 DIAGNOSIS — R6521 Severe sepsis with septic shock: Secondary | ICD-10-CM

## 2021-06-04 DIAGNOSIS — E873 Alkalosis: Secondary | ICD-10-CM

## 2021-06-04 DIAGNOSIS — J9621 Acute and chronic respiratory failure with hypoxia: Secondary | ICD-10-CM | POA: Diagnosis not present

## 2021-06-04 DIAGNOSIS — J9622 Acute and chronic respiratory failure with hypercapnia: Secondary | ICD-10-CM | POA: Diagnosis not present

## 2021-06-04 LAB — COMPREHENSIVE METABOLIC PANEL
ALT: 100 U/L — ABNORMAL HIGH (ref 0–44)
ALT: 101 U/L — ABNORMAL HIGH (ref 0–44)
AST: 55 U/L — ABNORMAL HIGH (ref 15–41)
AST: 55 U/L — ABNORMAL HIGH (ref 15–41)
Albumin: 3.7 g/dL (ref 3.5–5.0)
Albumin: 3.6 g/dL (ref 3.5–5.0)
Alkaline Phosphatase: 88 U/L (ref 38–126)
Alkaline Phosphatase: 87 U/L (ref 38–126)
Anion gap: 13 (ref 5–15)
Anion gap: 12 (ref 5–15)
BUN: 56 mg/dL — ABNORMAL HIGH (ref 8–23)
BUN: 58 mg/dL — ABNORMAL HIGH (ref 8–23)
CO2: 24 mmol/L (ref 22–32)
CO2: 25 mmol/L (ref 22–32)
Calcium: 10.1 mg/dL (ref 8.9–10.3)
Calcium: 9.9 mg/dL (ref 8.9–10.3)
Chloride: 96 mmol/L — ABNORMAL LOW (ref 98–111)
Chloride: 95 mmol/L — ABNORMAL LOW (ref 98–111)
Creatinine, Ser: 4.05 mg/dL — ABNORMAL HIGH (ref 0.44–1.00)
Creatinine, Ser: 4.18 mg/dL — ABNORMAL HIGH (ref 0.44–1.00)
GFR, Estimated: 10 mL/min — ABNORMAL LOW (ref >60)
GFR, Estimated: 10 mL/min — ABNORMAL LOW (ref >60)
Glucose, Bld: 187 mg/dL — ABNORMAL HIGH (ref 70–99)
Glucose, Bld: 186 mg/dL — ABNORMAL HIGH (ref 70–99)
Potassium: 3.7 mmol/L (ref 3.5–5.1)
Potassium: 3.8 mmol/L (ref 3.5–5.1)
Sodium: 133 mmol/L — ABNORMAL LOW (ref 135–145)
Sodium: 132 mmol/L — ABNORMAL LOW (ref 135–145)
Total Bilirubin: 0.6 mg/dL (ref 0.3–1.2)
Total Bilirubin: 0.8 mg/dL (ref 0.3–1.2)
Total Protein: 6.4 g/dL — ABNORMAL LOW (ref 6.5–8.1)
Total Protein: 6.2 g/dL — ABNORMAL LOW (ref 6.5–8.1)

## 2021-06-04 LAB — BLOOD GAS, ARTERIAL
Acid-Base Excess: 4.4 mmol/L — ABNORMAL HIGH (ref 0.0–2.0)
Acid-Base Excess: 5.9 mmol/L — ABNORMAL HIGH (ref 0.0–2.0)
Bicarbonate: 23.7 mmol/L (ref 20.0–28.0)
Bicarbonate: 27.8 mmol/L (ref 20.0–28.0)
Drawn by: 22179
FIO2: 50 %
FIO2: 40 %
O2 Saturation: 99.7 %
O2 Saturation: 98.6 %
Patient temperature: 37
Patient temperature: 36
pCO2 arterial: 22 mmHg — ABNORMAL LOW (ref 32–48)
pCO2 arterial: 30 mmHg — ABNORMAL LOW (ref 32–48)
pH, Arterial: 7.64 (ref 7.35–7.45)
pH, Arterial: 7.58 — ABNORMAL HIGH (ref 7.35–7.45)
pO2, Arterial: 87 mmHg (ref 83–108)
pO2, Arterial: 102 mmHg (ref 83–108)

## 2021-06-04 LAB — PROCALCITONIN: Procalcitonin: 0.82 ng/mL

## 2021-06-04 LAB — CBC
HCT: 26.6 % — ABNORMAL LOW (ref 36.0–46.0)
HCT: 26.6 % — ABNORMAL LOW (ref 36.0–46.0)
Hemoglobin: 8.1 g/dL — ABNORMAL LOW (ref 12.0–15.0)
Hemoglobin: 8.4 g/dL — ABNORMAL LOW (ref 12.0–15.0)
MCH: 32.3 pg (ref 26.0–34.0)
MCH: 33.6 pg (ref 26.0–34.0)
MCHC: 30.5 g/dL (ref 30.0–36.0)
MCHC: 31.6 g/dL (ref 30.0–36.0)
MCV: 106 fL — ABNORMAL HIGH (ref 80.0–100.0)
MCV: 106.4 fL — ABNORMAL HIGH (ref 80.0–100.0)
Platelets: 76 10*3/uL — ABNORMAL LOW (ref 150–400)
Platelets: 76 10*3/uL — ABNORMAL LOW (ref 150–400)
RBC: 2.51 MIL/uL — ABNORMAL LOW (ref 3.87–5.11)
RBC: 2.5 MIL/uL — ABNORMAL LOW (ref 3.87–5.11)
RDW: 17.6 % — ABNORMAL HIGH (ref 11.5–15.5)
RDW: 18.1 % — ABNORMAL HIGH (ref 11.5–15.5)
WBC: 4.4 10*3/uL (ref 4.0–10.5)
WBC: 4.4 10*3/uL (ref 4.0–10.5)
nRBC: 1.6 % — ABNORMAL HIGH (ref 0.0–0.2)
nRBC: 1.4 % — ABNORMAL HIGH (ref 0.0–0.2)

## 2021-06-04 LAB — AMMONIA: Ammonia: 26 umol/L (ref 9–35)

## 2021-06-04 LAB — LACTIC ACID, PLASMA: Lactic Acid, Venous: 1.7 mmol/L (ref 0.5–1.9)

## 2021-06-04 LAB — PHOSPHORUS: Phosphorus: 2.6 mg/dL (ref 2.5–4.6)

## 2021-06-04 LAB — GLUCOSE, CAPILLARY
Glucose-Capillary: 175 mg/dL — ABNORMAL HIGH (ref 70–99)
Glucose-Capillary: 173 mg/dL — ABNORMAL HIGH (ref 70–99)
Glucose-Capillary: 198 mg/dL — ABNORMAL HIGH (ref 70–99)
Glucose-Capillary: 149 mg/dL — ABNORMAL HIGH (ref 70–99)

## 2021-06-04 LAB — TROPONIN I (HIGH SENSITIVITY)
Troponin I (High Sensitivity): 500 ng/L (ref <18)
Troponin I (High Sensitivity): 519 ng/L (ref <18)

## 2021-06-04 LAB — MAGNESIUM: Magnesium: 2.4 mg/dL (ref 1.7–2.4)

## 2021-06-04 MED ORDER — ALBUMIN HUMAN 25 % IV SOLN: 25.0000 g | INTRAVENOUS | Status: DC | PRN

## 2021-06-04 MED ORDER — CHLORHEXIDINE GLUCONATE 0.12% ORAL RINSE (MEDLINE KIT)
15.0000 mL | Freq: Two times a day (BID) | OROMUCOSAL | Status: DC
  Administered 2021-06-04 – 2021-06-05 (×3): 15 mL via OROMUCOSAL

## 2021-06-04 MED ORDER — ALBUMIN HUMAN 25 % IV SOLN
INTRAVENOUS | Status: AC
  Administered 2021-06-04: 25 g via INTRAVENOUS
  Filled 2021-06-04: qty 50

## 2021-06-04 MED ORDER — ORAL CARE MOUTH RINSE
15.0000 mL | OROMUCOSAL | Status: DC
  Administered 2021-06-04 – 2021-06-05 (×13): 15 mL via OROMUCOSAL

## 2021-06-04 MED ORDER — ALBUMIN HUMAN 25 % IV SOLN
INTRAVENOUS | Status: AC
  Filled 2021-06-04: qty 50

## 2021-06-04 NOTE — Progress Notes (Addendum)
CODE BLUE was called at 7:33 PM yesterday, at bedside, patient had pulse but was in respiratory failure and was only having agonal breaths and unable to protect airway.  It was decided that patient needs to be intubated so as to protect airway, ED physician (Dr. Melina Copa) helped to intubate the patient and she was sedated on IV Versed and fentanyl. ?ABG ?   ?Component Value Date/Time  ? PHART 7.64 (HH) 06/04/2021 0230  ? PCO2ART 22 (L) 06/04/2021 0230  ? PO2ART 87 06/04/2021 0230  ? HCO3 23.7 06/04/2021 0230  ? TCO2 17 05/27/2014 1835  ? ACIDBASEDEF 1.0 06/03/2021 2003  ? O2SAT 99.7 06/04/2021 0230  ? ? ?Family was contacted and updated about patient's condition. ? ?General: Elderly Female with agonal breaths and in an acute distress.  ?HEENT: NCAT.  PERRLA. EOMI. Sclerae anicteric.  Moist mucosal membranes. ?Neck: Neck supple without lymphadenopathy. No carotid bruits. No masses palpated.  ?Cardiovascular: Regular rate with normal S1-S2 sounds. No murmurs, rubs or gallops auscultated. No JVD.  ?Respiratory: Agonal breaths with diminished breath sounds requiring breathing via BVM ?Abdomen: Soft, nontender, nondistended. Active bowel sounds. No masses or hepatosplenomegaly  ?Skin: No rashes, lesions, or ulcerations.  Dry, warm to touch. ?Musculoskeletal:  2+ dorsalis pedis and radial pulses.  She was moving all extremities ?Psychiatric: This cannot be obtained at this time due to patient's current condition ?Neurologic: This cannot be obtained at this time due to patient's current condition ? ?Assessment and plan: ?Acute on chronic respiratory failure with hypoxia and hypercapnia ?Respiratory alkalosis ?Patient was intubated and sedated with IV Versed and fentanyl ?Continue mechanical ventilation with goals to wean patient off the vent as tolerated ?ABG will be repeated in the morning ? ?Goals of care: Palliative care will be consulted ? ?Critical time: 37 minutes ? ? Critical care personally provided  managing the  patient due to high probability of clinically significant and life threatening deterioration. This critical care time included obtaining a history; examining the patient, pulse oximetry; ordering and review of studies; arranging urgent treatment with development of a management plan; evaluation of patient's response of treatment; frequent reassessment; and discussions with other providers.  This critical care time was performed to assess and manage the high probability of imminent and life threatening deterioration that could result in multi-organ failure. ? ?

## 2021-06-04 NOTE — Progress Notes (Signed)
?  ?       ?PROGRESS NOTE ? ?Jody [name] ZOX:[id number] DOB: 02-Nov-1940 DOA: 05/31/2021 ?PCP: Monico Blitz, MD ? ?Brief History:  ?81 y.o. female with medical history significant of stroke, ESRD, COPD on 2L O2, HFpEF, and PAD with recent left great toe osteomyelitis treated with amputation who [facility] to [facility]responsiveness. She was picked up at her nursing facility to go to routine HD and became responsive only to sternal rub so was transferred to the ED instead. She was found to be hypoxic and hypercarbic requiring BiPAP. With BiPAP her mentation has improved. She is able to give a limited history, reports a recent history of coughing and feeling unwell generally but denies shortness of breath or chest pain. Denies recent wheezing.  ?  ?CXR demonstrated hypoventilation with streaky bibasilar opacities with enlarged cardiac silhouette and left lower lobe opacity consistent with pneumonia. CT head revealed atrophy and chronic infarcts without acute pathology. Blood cultures were drawn, antibiotics and IV fluids administered, and hospitalists consulted for admission. ? ?Patient improved clinically and was weaned off levophed and weaned back to her baseline 2L.  She was conversant and mentation returned to baseline.   ? ?On evening of 06/03/21, patient was found obtunded with agonal respirations with oxygen saturation in 60% range on her 2L.  Nursing reported pt had a pulse and appropriate BP (no hypotension reported although there is a single BP 83/52 noted).  There was no bradycardia reported during the event. ?Pt was bagged and apparently still "moving around" but there was concerned that with her mental status, she was unable to protect airway>>intubated at [place].  Initial ABG after intubation 7.45/32/239/22 (1.0).  ? ? ?Assessment and Plan: ?* Acute on chronic respiratory failure with hypoxia and hypercapnia (HCC) ?Due to LLL pneumonia and COPD exacerbation.  ?- initially on BiPAP, admit to SDU.  HCPOA has confirmed pt's full code status,  ?-weaned back to 2L on 06/01/21 ?-on 2L [place] at baseline ?06/03/21 1945--intubated due to agonal respirations ?06/04/21--check ABG, ?Personally reviewed CXR--increased interstitial markings ?Obtain EKG, labs ? ? ?Septic shock ([place]) ?Due to pneumonia ?-present on admission ?-presented with tachypnea, tachycardia, hypotension ?-personally reviewed CXR--bibasilar opacity L>R ?-continue ceftriaxone and azithromycin ?-PCT--1.05 ?-06/02/21 AM--weaned off levophed ?-remains stable off levophed 4/21 and 4/22 ?-sepsis physiology resolved ? ?Lobar pneumonia ([place]) ?Continue ceftriaxone and azithromycin D#5 ?-check PCT--1.05 ? ?COPD with acute exacerbation ([place]) ?Continue IV steroids>>po prednisone ?-continue BDs ?-continue pulmicort ? ?ESRD on dialysis [facility]) ?- Nephrology consulted for HD ?- last HD 4/19>>3L removed UF ?- planning for HD 4/22 ? ?Thrombocytopenia ([place]) ?Fairly chronic dating back to Feb 2023 ?-B12--1435 ?-folate-7.0 ?-TSH--1.132 ?-overall stable ? ?Pericardial friction rub ?I did not hear on exam ?Discussed with cardiology, Dr. Ross>>no pericardial rub noted ?06/03/21 Echo-EF 50%, septal HK and flattening, no pericardial effusion, severe TR ?Personally reviewed EKG, unchanged RBBB ?ESR 2, CRP 0.6 ? ?Persistent atrial fibrillation ([place]) ?Continue apixaban ?Holding metoprolol due to hypotension initially>>restarted 4/21 ?Largely rate controlled ? ?PAD (peripheral artery disease) ([place]) ?- Continue statin and ASA ? ?Anemia of chronic kidney failure ?- baseline Hgb 8-9 ? ?Acute osteomyelitis of left foot ([place]) ?S/p left first ray amputation 04/08/21--Dr. Jacqualyn Posey ?- Pain control meds include oxycodone and gabapentin which has recently been increased to 3103m.  ?- wound looks clean and intact without signs of infection ? ?History of CVA (cerebrovascular accident) ?CT head nonacute, no definite focal deficits on limited exam.  ?- Continue ASA, statin ? ?Iron deficiency anemia ?-  Continue iron supplement ? ?Chronic combined systolic and diastolic CHF (congestive heart failure) ([place]) ?Was above her usual dry weight ?- Manage volume with HD ?-06/03/21 Echo EF 50%, RV overload, severe TR ?08/02/20 Echo EF 45-50% ? ?Essential hypertension ?Initially Holding metoprolol due to hypotension>>restarted 4/21 ? ? ? ? ? ?Family Communication:  no  Family at bedside ? ?Consultants:  renal ? ?Code Status:  FULL  ? ?DVT Prophylaxis:  apixaban ? ? ?Procedures: ?As Listed in Progress Note Above ? ?Antibiotics: ?Ceftriaxone 4/18>> ?Azithro 4/18>> ? ? ? ? ? ? ?Subjective: ?Intubated and sedated ? ?Objective: ?Vitals:  ? 06/04/21 0410 06/04/21 0444 06/04/21 0518 06/04/21 0740  ?BP:      ?Pulse:    67  ?Resp:    16  ?Temp:   (!) 96.9 ?F (36.1 ?C) (!) 96.8 ?F (36 ?C)  ?TempSrc:   Axillary Axillary  ?SpO2:    99%  ?Weight:  86.3 kg    ?Height: '5\' 7"'  (1.702 m)     ? ? ?Intake/Output Summary (Last 24 hours) at 06/04/2021 0904 ?Last data filed at 06/04/2021 0444 ?Gross per 24 hour  ?Intake 1479.65 ml  ?Output --  ?Net 1479.65 ml  ? ?Weight change: -2.6 kg ?Exam: ? ?General:  Pt is intubated and sedated ?HEENT: No icterus, No thrush, No neck mass, [place]/AT ?Cardiovascular: IRRR, S1/S2, no rubs, no gallops ?Respiratory: bilateral rales.  No wheeze ?Abdomen: Soft/+BS, non tender, non distended, no guarding ?Extremities: No edema, No lymphangitis, No petechiae, No rashes, no synovitis ? ? ?Data Reviewed: ?I have personally reviewed following labs and imaging studies ?Basic Metabolic Panel: ?Recent Labs  ?Lab 05/31/21 ?1612 06/01/21 ?0420 06/03/21 ?3143 06/03/21 ?1853 06/04/21 ?8887  ?NA 133* 136 134* 133* 133*  ?K 4.2 4.3 4.2 4.0 3.7  ?CL 95* 99 95* 94* 96*  ?CO2 '29 23 28 26 24  ' ?GLUCOSE 105* 103* 167* 248* 187*  ?BUN 54* 34* 46* 50* 56*  ?CREATININE 4.74* 3.10* 3.65* 3.88* 4.05*  ?CALCIUM 9.4 9.0 10.0 9.7 10.1  ?MG  --   --   --  2.5* 2.4  ?PHOS 5.5*  --  4.3 4.3 2.6  ? ?Liver Function Tests: ?Recent Labs  ?Lab 05/31/21 ?1236  05/31/21 ?1612 06/03/21 ?5797 06/03/21 ?1853 06/04/21 ?2820  ?AST 20  --   --   --  55*  ?ALT 15  --   --   --  100*  ?ALKPHOS 103  --   --   --  88  ?BILITOT 0.5  --   --   --  0.6  ?PROT 7.3  --   --   --  6.4*  ?ALBUMIN 3.7 3.2* 4.2 4.1 3.7  ? ?No results for input(s): LIPASE, AMYLASE in the last 168 hours. ?Recent Labs  ?Lab 05/31/21 ?1236  ?AMMONIA 24  ? ?Coagulation Profile: ?Recent Labs  ?Lab 05/31/21 ?1236  ?INR 1.6*  ? ?CBC: ?Recent Labs  ?Lab 05/31/21 ?1236 05/31/21 ?1612 06/01/21 ?0420 06/02/21 ?0415 06/03/21 ?1853 06/04/21 ?6015  ?WBC 5.1 5.5 8.2 7.3 4.7 4.4  ?NEUTROABS 4.0  --   --   --   --   --   ?HGB 9.1* 8.8* 9.2* 8.0* 8.3* 8.1*  ?HCT 30.5* 29.6* 32.4* 27.8* 29.2* 26.6*  ?MCV 113.0* 113.4* 119.1* 112.6* 113.2* 106.0*  ?PLT 111* 106* 111* 102* 87* 76*  ? ?Cardiac Enzymes: ?No results for input(s): CKTOTAL, CKMB, CKMBINDEX, TROPONINI in the last 168 hours. ?BNP: ?Invalid input(s): POCBNP ?CBG: ?Recent Labs  ?Lab 05/31/21 ?  1019 06/01/21 ?0732 06/03/21 ?1934 06/04/21 ?0735  ?GLUCAP 100* 93 229* 175*  ? ?HbA1C: ?No results for input(s): HGBA1C in the last 72 hours. ?Urine analysis: ?   ?Component Value Date/Time  ? [place] YELLOW 05/27/2014 1902  ? APPEARANCEUR CLEAR 05/27/2014 1902  ? LABSPEC 1.012 05/27/2014 1902  ? PHURINE 5.0 05/27/2014 1902  ? GLUCOSEU NEGATIVE 05/27/2014 1902  ? [place] NEGATIVE 05/27/2014 1902  ? [place] NEGATIVE 05/27/2014 1902  ? [place] Stain NEGATIVE 05/27/2014 1902  ? PROTEINUR 100 (A) 05/27/2014 1902  ? UROBILINOGEN 0.2 05/27/2014 1902  ? NITRITE NEGATIVE 05/27/2014 1902  ? LEUKOCYTESUR NEGATIVE 05/27/2014 1902  ? ?Sepsis Labs: ?'@LABRCNTIP' (procalcitonin:4,lacticidven:4) ?) ?Recent Results (from the past 240 hour(s))  ?Blood Culture (routine x 2)     Status: None (Preliminary result)  ? Collection Time: 05/31/21 10:28 AM  ? Specimen: BLOOD  ?Result Value Ref Range Status  ? Specimen Description BLOOD BLOOD LEFT FOREARM  Final  ? Special Requests   Final  ?  BOTTLES DRAWN  AEROBIC AND ANAEROBIC Blood Culture results may not be optimal due to an inadequate volume of blood received in culture bottles  ? Culture   Final  ?  NO GROWTH 3 DAYS ?Performed at [facility], [place]

## 2021-06-04 NOTE — Progress Notes (Signed)
Approximately 1150 attempted SBT with patient (sedation had been discontinued since 0930) on PS 10/ CPAP 5 with 40% . The patients SpO2 dropped to 78%, RR to 4 and Vt 108. She was returned to  [facility] 16, Vt 490, 40% ,PEEP 5.  ?I attempted another SBT at approximately 1525 and she was unable to maintain  an adequate RR, Vt, or SpO2 she was returned to rest mode. ?

## 2021-06-04 NOTE — Progress Notes (Signed)
Critical troponin called and ,messaged to Dr. Carles Collet of 500 ?

## 2021-06-04 NOTE — Progress Notes (Addendum)
Arterial blood gas checked for PH , PCO2 --- PH 7.60 , PCO2  22------rate decreased from 24 to 16 , FiO2 decreased to 40.  ?

## 2021-06-04 NOTE — Plan of Care (Signed)

## 2021-06-04 NOTE — Progress Notes (Signed)
Labs from today reviewed  ?Troponin 500>>519 ?--like demand ischemia ?--repeat troponin in am ?--EKG reviewed--sinus, unchanged RBBB ?4/21 Echo--Septal flattening consistent with  ?volume / pressure overload. Left ventricular ejection fraction, by  ?estimation, is 50%%.  ?06/04/21 @ 0849 ABG 7.58/30/102/27 ?Ammonia 26 ?Lactic 1.7 ?PCT 0.82 ?CT brain neg ?Failed SBT with sedation off 2 hours ? ?Repeat ABG in am ?Try SBT again in am ?Am labs ordered ? ?DTat ? ?

## 2021-06-05 DIAGNOSIS — J9621 Acute and chronic respiratory failure with hypoxia: Secondary | ICD-10-CM | POA: Diagnosis not present

## 2021-06-05 DIAGNOSIS — R778 Other specified abnormalities of plasma proteins: Secondary | ICD-10-CM | POA: Diagnosis not present

## 2021-06-05 DIAGNOSIS — J441 Chronic obstructive pulmonary disease with (acute) exacerbation: Secondary | ICD-10-CM | POA: Diagnosis not present

## 2021-06-05 DIAGNOSIS — I5042 Chronic combined systolic (congestive) and diastolic (congestive) heart failure: Secondary | ICD-10-CM

## 2021-06-05 LAB — GLUCOSE, CAPILLARY
Glucose-Capillary: 132 mg/dL — ABNORMAL HIGH (ref 70–99)
Glucose-Capillary: 142 mg/dL — ABNORMAL HIGH (ref 70–99)
Glucose-Capillary: 148 mg/dL — ABNORMAL HIGH (ref 70–99)
Glucose-Capillary: 155 mg/dL — ABNORMAL HIGH (ref 70–99)
Glucose-Capillary: 158 mg/dL — ABNORMAL HIGH (ref 70–99)

## 2021-06-05 LAB — CULTURE, BLOOD (ROUTINE X 2)
Culture: NO GROWTH
Culture: NO GROWTH

## 2021-06-05 LAB — BLOOD GAS, ARTERIAL
Acid-Base Excess: 6.7 mmol/L — ABNORMAL HIGH (ref 0.0–2.0)
Bicarbonate: 28.7 mmol/L — ABNORMAL HIGH (ref 20.0–28.0)
FIO2: 35 %
O2 Saturation: 93.1 %
Patient temperature: 37
pCO2 arterial: 32 mmHg (ref 32–48)
pH, Arterial: 7.56 — ABNORMAL HIGH (ref 7.35–7.45)
pO2, Arterial: 58 mmHg — ABNORMAL LOW (ref 83–108)

## 2021-06-05 LAB — COMPREHENSIVE METABOLIC PANEL
ALT: 105 U/L — ABNORMAL HIGH (ref 0–44)
AST: 68 U/L — ABNORMAL HIGH (ref 15–41)
Albumin: 3.6 g/dL (ref 3.5–5.0)
Alkaline Phosphatase: 72 U/L (ref 38–126)
Anion gap: 10 (ref 5–15)
BUN: 35 mg/dL — ABNORMAL HIGH (ref 8–23)
CO2: 28 mmol/L (ref 22–32)
Calcium: 9.6 mg/dL (ref 8.9–10.3)
Chloride: 97 mmol/L — ABNORMAL LOW (ref 98–111)
Creatinine, Ser: 2.71 mg/dL — ABNORMAL HIGH (ref 0.44–1.00)
GFR, Estimated: 17 mL/min — ABNORMAL LOW (ref >60)
Glucose, Bld: 155 mg/dL — ABNORMAL HIGH (ref 70–99)
Potassium: 4.1 mmol/L (ref 3.5–5.1)
Sodium: 135 mmol/L (ref 135–145)
Total Bilirubin: 1.1 mg/dL (ref 0.3–1.2)
Total Protein: 6.2 g/dL — ABNORMAL LOW (ref 6.5–8.1)

## 2021-06-05 LAB — TROPONIN I (HIGH SENSITIVITY): Troponin I (High Sensitivity): 363 ng/L (ref <18)

## 2021-06-05 MED ORDER — ASPIRIN 81 MG PO CHEW
81.0000 mg | CHEWABLE_TABLET | Freq: Every day | ORAL | Status: DC
  Administered 2021-06-06 – 2021-06-07 (×2): 81 mg via ORAL
  Filled 2021-06-05 (×2): qty 1

## 2021-06-05 MED ORDER — VITAMIN B-12 100 MCG PO TABS
100.0000 ug | ORAL_TABLET | Freq: Every day | ORAL | Status: DC
  Administered 2021-06-06 – 2021-06-07 (×2): 100 ug via ORAL
  Filled 2021-06-05 (×4): qty 1

## 2021-06-05 MED ORDER — METOPROLOL TARTRATE 25 MG PO TABS
12.5000 mg | ORAL_TABLET | Freq: Two times a day (BID) | ORAL | Status: DC
  Administered 2021-06-05 – 2021-06-06 (×3): 12.5 mg via ORAL
  Filled 2021-06-05 (×3): qty 1

## 2021-06-05 MED ORDER — APIXABAN 2.5 MG PO TABS
2.5000 mg | ORAL_TABLET | Freq: Two times a day (BID) | ORAL | Status: DC
  Administered 2021-06-05 – 2021-06-07 (×4): 2.5 mg via ORAL
  Filled 2021-06-05 (×4): qty 1

## 2021-06-05 MED ORDER — PREDNISONE 20 MG PO TABS
50.0000 mg | ORAL_TABLET | Freq: Every day | ORAL | Status: DC
Start: 2021-06-06 — End: 2021-06-07
  Administered 2021-06-06 – 2021-06-07 (×2): 50 mg via ORAL
  Filled 2021-06-05: qty 1
  Filled 2021-06-05: qty 3

## 2021-06-05 MED ORDER — FOLIC ACID 1 MG PO TABS
1.0000 mg | ORAL_TABLET | Freq: Every day | ORAL | Status: DC
  Administered 2021-06-06 – 2021-06-07 (×2): 1 mg via ORAL
  Filled 2021-06-05 (×2): qty 1

## 2021-06-05 MED ORDER — SEVELAMER CARBONATE 800 MG PO TABS
800.0000 mg | ORAL_TABLET | Freq: Three times a day (TID) | ORAL | Status: DC
  Administered 2021-06-05 – 2021-06-06 (×4): 800 mg via ORAL
  Filled 2021-06-05 (×5): qty 1

## 2021-06-05 MED ORDER — ATORVASTATIN CALCIUM 40 MG PO TABS
40.0000 mg | ORAL_TABLET | Freq: Every day | ORAL | Status: DC
Start: 2021-06-06 — End: 2021-06-07
  Administered 2021-06-06 – 2021-06-07 (×2): 40 mg via ORAL
  Filled 2021-06-05 (×2): qty 1

## 2021-06-05 MED ORDER — FERROUS SULFATE 300 (60 FE) MG/5ML PO SYRP
300.0000 mg | ORAL_SOLUTION | Freq: Two times a day (BID) | ORAL | Status: DC
  Administered 2021-06-05 – 2021-06-07 (×4): 300 mg via ORAL
  Filled 2021-06-05 (×6): qty 5

## 2021-06-05 MED ORDER — RENA-VITE PO TABS
1.0000 | ORAL_TABLET | Freq: Every day | ORAL | Status: DC
  Administered 2021-06-05 – 2021-06-06 (×2): 1 via ORAL
  Filled 2021-06-05 (×2): qty 1

## 2021-06-05 NOTE — Progress Notes (Signed)
Wasted 100 ml of Versed and 75 ml of fentanyl. Waste placed in pharmaceuticals stericycle bin. Witness is Sherry Ruffing, Therapist, sports. Joan Flores, RN ?

## 2021-06-05 NOTE — Progress Notes (Signed)
?  ?       ?PROGRESS NOTE ? ?Jody [name] VOP:[id number] DOB: 03-26-1940 DOA: 05/31/2021 ?PCP: Monico Blitz, MD ? ?Brief History:  ?81 y.o. female with medical history significant of stroke, ESRD, COPD on 2L O2, HFpEF, and PAD with recent left great toe osteomyelitis treated with amputation who [facility]. She was picked up at her nursing facility to go to routine HD and became responsive only to sternal rub so was transferred to the ED instead. She was found to be hypoxic and hypercarbic requiring BiPAP. With BiPAP her mentation has improved. She is able to give a limited history, reports a recent history of coughing and feeling unwell generally but denies shortness of breath or chest pain. Denies recent wheezing.  ?  ?CXR demonstrated hypoventilation with streaky bibasilar opacities with enlarged cardiac silhouette and left lower lobe opacity consistent with pneumonia. CT head revealed atrophy and chronic infarcts without acute pathology. Blood cultures were drawn, antibiotics and IV fluids administered, and hospitalists consulted for admission. ? ?Patient improved clinically and was weaned off levophed and weaned back to her baseline 2L.  She was conversant and mentation returned to baseline.   ? ?On evening of 06/03/21, patient was found obtunded with agonal respirations with oxygen saturation in 60% range on her 2L.  Nursing reported pt had a pulse and appropriate BP (no sustained hypotension reported although there is a single BP 83/52 noted).  There was no bradycardia reported during the event though HR went to low 60s. ?Pt was bagged and apparently still "moving around" but there was concerned that with her mental status, she was unable to protect airway>>intubated at [place].  Apparently with BVM and intubation, her vitals signs improved.  Initial ABG after intubation 7.45/32/239/22 (1.0). ? ?On 06/04/21, sedation was weaned off and SBT was attempted but pt failed---on PS 10/  CPAP 5 with 40%>>The patient's SpO2 dropped to 78%, RR to 4 and Vt 108. She was returned to  [facility] 16, Vt 490, 40% ,PEEP 5.   ? ? ?Assessment and Plan: ?* Acute on chronic respiratory failure with hypoxia and hypercapnia (HCC) ?Due to LLL pneumonia and COPD exacerbation.  ?- initially on BiPAP, admit to SDU. HCPOA has confirmed pt's full code status,  ?-weaned back to 2L on 06/01/21 ?-on 2L [place] at baseline ?06/03/21 1945--intubated due to agonal respirations ?06/05/21-ABG 7.56/32/58/28 (0.35) ?Personally reviewed CXR--increased interstitial markings ?Ammonia = 26 ?4/23--try SBT again after sedation holiday ? ? ?Septic shock ([place]) ?Due to pneumonia ?-present on admission ?-presented with tachypnea, tachycardia, hypotension ?-personally reviewed CXR--bibasilar opacity L>R ?-continue ceftriaxone and azithromycin ?-PCT--1.05 ?-06/02/21 AM--weaned off levophed ?-remains stable off levophed 4/21 and 4/22 and 4/23 ?-sepsis physiology resolved ? ?Lobar pneumonia ([place]) ?Continue ceftriaxone and azithromycin D#6 ?-check PCT--1.05 (06/02/21) ?-PCT 0.82 on 4/22 ? ?COPD with acute exacerbation ([place]) ?Continue IV steroids>>po prednisone ?-continue BDs ?-continue pulmicort ? ?Elevated troponin ?Due to demand ischemia since intubation ?No evidence of ACS ?Troponin 500>>519>>363 ?-06/03/21 Echo EF 50%, RV overload, severe TR ?-08/02/20 Echo EF 45-50% HK inferoseptal, inferolateral, Inf wall; mod TR ? ?Persistent atrial fibrillation ([place]) ?Continue apixaban ?Holding metoprolol due to hypotension initially>>restarted 4/21 ?Largely rate controlled ?TSH 1.132 ? ?Chronic combined systolic and diastolic CHF (congestive heart failure) ([place]) ?Was above her usual dry weight ?- Manage volume with HD ?-06/03/21 Echo EF 50%, RV overload, severe TR ?-08/02/20 Echo EF 45-50% HK inferoseptal, inferolateral, Inf wall; mod TR ?-personally reviewed CXR 4/21--increased interstitial markings ? ?ESRD on  dialysis [facility]) ?- Nephrology consulted for HD ?- last HD  4/19>>3L removed UF ?-  HD 4/22 ? ?Thrombocytopenia ([place]) ?Fairly chronic dating back to Feb 2023 ?-B12--1435 ?-folate-7.0 ?-TSH--1.132 ?Due to infection and acute medical illness ? ?Pericardial friction rub ?I did not hear on exam ?Discussed with cardiology, Dr. Ross>>no pericardial rub noted ?06/03/21 Echo-EF 50%, septal HK and flattening, no pericardial effusion, severe TR ?Personally reviewed EKG, unchanged RBBB ?ESR 2, CRP 0.6 ? ?PAD (peripheral artery disease) ([place]) ?- Continue statin and ASA ? ?Anemia of chronic kidney failure ?- baseline Hgb 8-9 ?-no signs of blood loss ? ?Acute osteomyelitis of left foot ([place]) ?S/p left first ray amputation 04/08/21--Dr. Jacqualyn Posey ?- Pain control meds include oxycodone and gabapentin which has recently been increased to 329m.  ?- wound looks clean and intact without signs of infection ? ?History of CVA (cerebrovascular accident) ?CT head nonacute, no definite focal deficits on limited exam.  ?- Continue ASA, statin, apixaban ?Repeat CT brain 4/22--neg for acute findings ? ?Iron deficiency anemia ?- Continue iron supplement ? ?Essential hypertension ?Initially Holding metoprolol due to hypotension>>restarted 4/21 ? ? ? ? ? ? ? ? ? ? ?Family Communication:   no Family at bedside ? ?Consultants:  renal ? ?Code Status:  FULL  ? ?DVT Prophylaxis:  apixaban ? ? ?Procedures: ?As Listed in Progress Note Above ? ?Antibiotics: ?Ceftriaxone 4/18>> ?Azithro 4/18>> ? ? ? ? ? ?Subjective: ?Patient awake, intermittently follow commands with sedation off x 25 min.  Denies cp, sob, abd pain ? ?Objective: ?Vitals:  ? 06/05/21 0500 06/05/21 0600 06/05/21 0718 06/05/21 0730  ?BP: 135/77 (!) 127/59    ?Pulse: 79 72 66 87  ?Resp: '13 13 12 20  ' ?Temp:   (!) 97.3 ?F (36.3 ?C)   ?TempSrc:   Axillary   ?SpO2: 100% 100% 100% 100%  ?Weight: 84.1 kg     ?Height:      ? ? ?Intake/Output Summary (Last 24 hours) at 06/05/2021 0820 ?Last data filed at 06/05/2021 0500 ?Gross per 24 hour  ?Intake 165.22 ml   ?Output 3000 ml  ?Net -2834.78 ml  ? ?Weight change: 0 kg ?Exam: ? ?General:  Pt is alert, follows commands appropriately, not in acute distress ?HEENT: No icterus, No thrush, No neck mass, [place]/AT ?Cardiovascular: IRRR, S1/S2, no rubs, no gallops ?Respiratory: bilateral rales.  No wheeze ?Abdomen: Soft/+BS, non tender, non distended, no guarding ?Extremities: 1+LE edema, No lymphangitis, No petechiae, No rashes, no synovitis ? ? ?Data Reviewed: ?I have personally reviewed following labs and imaging studies ?Basic Metabolic Panel: ?Recent Labs  ?Lab 05/31/21 ?1612 06/01/21 ?0420 06/03/21 ?0601504/21/23 ?1853 06/04/21 ?0615304/22/23 ?0794304/23/23 ?02761 ?NA 133*   < > 134* 133* 133* 132* 135  ?K 4.2   < > 4.2 4.0 3.7 3.8 4.1  ?CL 95*   < > 95* 94* 96* 95* 97*  ?CO2 29   < > '28 26 24 25 28  ' ?GLUCOSE 105*   < > 167* 248* 187* 186* 155*  ?BUN 54*   < > 46* 50* 56* 58* 35*  ?CREATININE 4.74*   < > 3.65* 3.88* 4.05* 4.18* 2.71*  ?CALCIUM 9.4   < > 10.0 9.7 10.1 9.9 9.6  ?MG  --   --   --  2.5* 2.4  --   --   ?PHOS 5.5*  --  4.3 4.3 2.6  --   --   ? < > = values in this interval not displayed.  ? ?  Liver Function Tests: ?Recent Labs  ?Lab 05/31/21 ?1236 05/31/21 ?1612 06/03/21 ?5427 06/03/21 ?1853 06/04/21 ?0623 06/04/21 ?7628 06/05/21 ?3151  ?AST 20  --   --   --  55* 55* 68*  ?ALT 15  --   --   --  100* 101* 105*  ?ALKPHOS 103  --   --   --  88 87 72  ?BILITOT 0.5  --   --   --  0.6 0.8 1.1  ?PROT 7.3  --   --   --  6.4* 6.2* 6.2*  ?ALBUMIN 3.7   < > 4.2 4.1 3.7 3.6 3.6  ? < > = values in this interval not displayed.  ? ?No results for input(s): LIPASE, AMYLASE in the last 168 hours. ?Recent Labs  ?Lab 05/31/21 ?1236 06/04/21 ?1220  ?AMMONIA 24 26  ? ?Coagulation Profile: ?Recent Labs  ?Lab 05/31/21 ?1236  ?INR 1.6*  ? ?CBC: ?Recent Labs  ?Lab 05/31/21 ?1236 05/31/21 ?1612 06/02/21 ?0415 06/03/21 ?1853 06/04/21 ?7616 06/04/21 ?0737 06/05/21 ?1062  ?WBC 5.1   < > 7.3 4.7 4.4 4.4 4.6  ?NEUTROABS 4.0  --   --   --   --   --    --   ?HGB 9.1*   < > 8.0* 8.3* 8.1* 8.4* 8.7*  ?HCT 30.5*   < > 27.8* 29.2* 26.6* 26.6* 27.3*  ?MCV 113.0*   < > 112.6* 113.2* 106.0* 106.4* 105.4*  ?PLT 111*   < > 102* 87* 76* 76* 76*  ? < > = values in this

## 2021-06-05 NOTE — Plan of Care (Signed)

## 2021-06-05 NOTE — Progress Notes (Signed)
Morning Blood gas , PH 7.56  , PCO2 32 , -----reducing rate to 12 --from 16 .  ?

## 2021-06-05 NOTE — Procedures (Signed)
Extubation Procedure Note ?The patients mouth and ETT where suctioned before she was extubated. Post extubation she was placed on humidified 3L/m [place] with an SpO2 100%. ?Patient Details:   ?[name] ?DOB: Jan 05, 1941 ?MRN: [id number] ?  ?Airway Documentation:  ?Ventilator end date:06/05/21 ?Ventilator end time: 1143 ? ?  Evaluation ? O2 sats: stable throughout ?Complications: No apparent complications ?Patient did tolerate procedure well. ?Bilateral Breath Sounds: Diminished ?  ?Yes ? ?Pete Pelt ?06/05/2021, 11:49 AM ? ?

## 2021-06-05 NOTE — Assessment & Plan Note (Addendum)
Due to demand ischemia since intubation ?No evidence of ACS ?Troponin 500>>519>>363 ?-06/03/21 Echo EF 50%, RV overload, severe TR ?-08/02/20 Echo EF 45-50% HK inferoseptal, inferolateral, Inf wall; mod TR ?

## 2021-06-06 ENCOUNTER — Encounter: Payer: Medicare Other | Admitting: Podiatry

## 2021-06-06 ENCOUNTER — Inpatient Hospital Stay (HOSPITAL_COMMUNITY)
Admit: 2021-06-06 | Discharge: 2021-06-06 | Disposition: A | Discharge status: Home / Self Care | Payer: Medicare Other | Attending: Internal Medicine | Admitting: Internal Medicine

## 2021-06-06 DIAGNOSIS — R4189 Other symptoms and signs involving cognitive functions and awareness: Secondary | ICD-10-CM

## 2021-06-06 LAB — COMPREHENSIVE METABOLIC PANEL
ALT: 109 U/L — ABNORMAL HIGH (ref 0–44)
AST: 49 U/L — ABNORMAL HIGH (ref 15–41)
Albumin: 3.8 g/dL (ref 3.5–5.0)
Alkaline Phosphatase: 87 U/L (ref 38–126)
Anion gap: 13 (ref 5–15)
BUN: 46 mg/dL — ABNORMAL HIGH (ref 8–23)
CO2: 27 mmol/L (ref 22–32)
Calcium: 9.7 mg/dL (ref 8.9–10.3)
Chloride: 96 mmol/L — ABNORMAL LOW (ref 98–111)
Creatinine, Ser: 3.43 mg/dL — ABNORMAL HIGH (ref 0.44–1.00)
GFR, Estimated: 13 mL/min — ABNORMAL LOW (ref >60)
Glucose, Bld: 122 mg/dL — ABNORMAL HIGH (ref 70–99)
Potassium: 3.4 mmol/L — ABNORMAL LOW (ref 3.5–5.1)
Sodium: 136 mmol/L (ref 135–145)
Total Bilirubin: 0.5 mg/dL (ref 0.3–1.2)
Total Protein: 6.6 g/dL (ref 6.5–8.1)

## 2021-06-06 LAB — CBC
HCT: 31.3 % — ABNORMAL LOW (ref 36.0–46.0)
Hemoglobin: 9.3 g/dL — ABNORMAL LOW (ref 12.0–15.0)
MCH: 33.5 pg (ref 26.0–34.0)
MCHC: 29.7 g/dL — ABNORMAL LOW (ref 30.0–36.0)
MCV: 112.6 fL — ABNORMAL HIGH (ref 80.0–100.0)
Platelets: 106 10*3/uL — ABNORMAL LOW (ref 150–400)
RBC: 2.78 MIL/uL — ABNORMAL LOW (ref 3.87–5.11)
RDW: 19.9 % — ABNORMAL HIGH (ref 11.5–15.5)
WBC: 7.3 10*3/uL (ref 4.0–10.5)
nRBC: 0.4 % — ABNORMAL HIGH (ref 0.0–0.2)

## 2021-06-06 LAB — PROCALCITONIN: Procalcitonin: 0.62 ng/mL

## 2021-06-06 LAB — MAGNESIUM: Magnesium: 2.6 mg/dL — ABNORMAL HIGH (ref 1.7–2.4)

## 2021-06-06 MED ORDER — CEFDINIR 300 MG PO CAPS
300.0000 mg | ORAL_CAPSULE | Freq: Every day | ORAL | Status: DC
  Administered 2021-06-06 – 2021-06-07 (×2): 300 mg via ORAL
  Filled 2021-06-06 (×2): qty 1

## 2021-06-06 NOTE — Progress Notes (Signed)
SLP Cancellation Note ? ?Patient Details ?[name] ?MRN: [id number] ?DOB: 1940/08/04 ? ? ?Cancelled treatment:       Reason Eval/Treat Not Completed: Patient at procedure or test/unavailable; Pt getting EEG at this time. RN reports that Pt was extubated yesterday and her voice is slightly hoarse and she had some difficulty swallowing a large pill earlier. Pt had BSE last week before intubation. Consider putting pills whole in puree. SLP will complete BSE as schedule permits. ? ?Thank you, ? ?Genene Churn, [place] ?[phone] ? ? ? ?[name] ?06/06/2021, 5:08 PM ?

## 2021-06-06 NOTE — Plan of Care (Signed)
?  Problem: Acute Rehab PT Goals(only PT should resolve) ?Goal: Pt Will Go Supine/Side To Sit ?Outcome: Progressing ?Flowsheets (Taken 06/06/2021 1559) ?Pt will go Supine/Side to Sit: ? with moderate assist ? with minimal assist ?Goal: Patient Will Transfer Sit To/From Stand ?Outcome: Progressing ?Flowsheets (Taken 06/06/2021 1559) ?Patient will transfer sit to/from stand: ? with moderate assist ? with maximum assist ?Goal: Pt Will Transfer Bed To Chair/Chair To Bed ?Outcome: Progressing ?Flowsheets (Taken 06/06/2021 1559) ?Pt will Transfer Bed to Chair/Chair to Bed: ? total assist ? with max assist ?Goal: Pt Will Perform Standing Balance Or Pre-Gait ?Outcome: Progressing ?Flowsheets (Taken 06/06/2021 1559) ?Pt will perform standing balance or pre-gait: ? with moderate assist ? with maximum assist ?  ?4:01 PM, 06/06/21 ?Lonell Grandchild, MPT ?Physical Therapist with [place] ?[facility] ?[phone] office ?[phone] mobile phone ? ?

## 2021-06-06 NOTE — Progress Notes (Addendum)
?  ?       ?PROGRESS NOTE ? ?Jody [name] GYK:[id number] DOB: 09-17-1940 DOA: 05/31/2021 ?PCP: Monico Blitz, MD ? ?Brief History:  ?81 y.o. female with medical history significant of stroke, ESRD, COPD on 2L O2, HFpEF, and PAD with recent left great toe osteomyelitis treated with amputation who [facility] due to unresponsiveness. She was picked up at her nursing facility to go to routine HD and became responsive only to sternal rub so was transferred to the ED instead. She was found to be hypoxic and hypercarbic requiring BiPAP. With BiPAP her mentation has improved. She is able to give a limited history, reports a recent history of coughing and feeling unwell generally but denies shortness of breath or chest pain. Denies recent wheezing.  ?  ?CXR demonstrated hypoventilation with streaky bibasilar opacities with enlarged cardiac silhouette and left lower lobe opacity consistent with pneumonia. CT head revealed atrophy and chronic infarcts without acute pathology. Blood cultures were drawn, antibiotics and IV fluids administered, and hospitalists consulted for admission. ? ?Patient improved clinically and was weaned off levophed and weaned back to her baseline 2L.  She was conversant and mentation returned to baseline.   ? ?On evening of 06/03/21, patient was found obtunded with agonal respirations with oxygen saturation in 60% range on her 2L.  Nursing reported pt had a pulse and appropriate BP (no sustained hypotension reported although there is a single BP 83/52 noted).  There was no bradycardia reported during the event though HR went to low 60s. ?Pt was bagged and apparently still "moving around" but there was concerned that with her mental status, she was unable to protect airway>>intubated at [place].  Apparently with BVM and intubation, her vitals signs improved.  Initial ABG after intubation 7.45/32/239/22 (1.0). ? ?On 06/04/21, sedation was weaned off and SBT was attempted but pt failed---on PS 10/  CPAP 5 with 40%>>The patient's SpO2 dropped to 78%, RR to 4 and Vt 108. She was returned to  [facility] 16, Vt 490, 40% ,PEEP 5.  CT brain negative ? ?06/05/21--sedation weaned off and had successful SBT>>extubated.  Mental status remained stable after extubation.  She remained hemodynamically stable.   ? ?06/06/21--mental status back to baseline, improved.  Remained hemodynamically stable.  Discussed with neurology, Dr. Hortense Ramal, due concern for occult seizure for her episodes of unresponsiveness.  Plan for EEG today and HD tomorrow (as scheduled).  If EEG is negative and HD is uneventful tomorrow, pt can d/c back to SNF without any AEDs  ? ? ?Assessment and Plan: ?* Acute on chronic respiratory failure with hypoxia and hypercapnia (HCC) ?Due to LLL pneumonia and COPD exacerbation.  ?- initially on BiPAP, admit to SDU. HCPOA has confirmed pt's full code status,  ?-weaned back to 2L on 06/01/21 ?-on 2L [place] at baseline ?06/03/21 1945--intubated due to agonal respirations ?06/05/21-ABG 7.56/32/58/28 (0.35) ?Personally reviewed CXR--increased interstitial markings ?Ammonia = 26 ?4/23--extubated ?4/24--stable on 2L.  Order EEG.  Discussed with neurology ? ? ?Septic shock ([place]) ?Due to pneumonia ?-present on admission ?-presented with tachypnea, tachycardia, hypotension ?-personally reviewed CXR--bibasilar opacity L>R ?-continue ceftriaxone and azithromycin ?-PCT--1.05 ?-06/02/21 AM--weaned off levophed ?-remains stable off levophed 4/21 and 4/22 and 4/23 ?-sepsis physiology resolved ? ?Lobar pneumonia ([place]) ?Continue ceftriaxone and azithromycin D#6 ?-check PCT--1.05 (06/02/21) ?-PCT 0.82 on 4/22 ? ?COPD with acute exacerbation ([place]) ?Continue IV steroids>>po prednisone ?-continue BDs ?-continue pulmicort ? ?Elevated troponin ?Due to demand ischemia since intubation ?No evidence of ACS ?Troponin 500>>519>>363 ?-06/03/21 Echo  EF 50%, RV overload, severe TR ?-08/02/20 Echo EF 45-50% HK inferoseptal, inferolateral, Inf wall; mod  TR ? ?Persistent atrial fibrillation ([place]) ?Continue apixaban ?Holding metoprolol due to hypotension initially>>restarted 4/21 ?Largely rate controlled ?TSH 1.132 ? ?Chronic combined systolic and diastolic CHF (congestive heart failure) ([place]) ?Was above her usual dry weight ?- Manage volume with HD ?-06/03/21 Echo EF 50%, RV overload, severe TR ?-08/02/20 Echo EF 45-50% HK inferoseptal, inferolateral, Inf wall; mod TR ?-personally reviewed CXR 4/21--increased interstitial markings ? ?ESRD on dialysis [facility]) ?- Nephrology consulted for HD ?- last HD 4/19>>3L removed UF ?-  HD 4/22 ?- plan HD 4/25 ? ?Thrombocytopenia ([place]) ?Fairly chronic dating back to Feb 2023 ?-B12--1435 ?-folate-7.0 ?-TSH--1.132 ?Due to infection and acute medical illness ? ?Pericardial friction rub ?I did not hear on exam ?Discussed with cardiology, Dr. Ross>>no pericardial rub noted ?06/03/21 Echo-EF 50%, septal HK and flattening, no pericardial effusion, severe TR ?Personally reviewed EKG, unchanged RBBB ?ESR 2, CRP 0.6 ? ?PAD (peripheral artery disease) ([place]) ?- Continue statin and ASA ? ?Anemia of chronic kidney failure ?- baseline Hgb 8-9 ?-no signs of blood loss ? ?Acute osteomyelitis of left foot ([place]) ?S/p left first ray amputation 04/08/21--Dr. Jacqualyn Posey ?- Pain control meds include oxycodone and gabapentin which has recently been increased to $RemoveBefo'300mg'BhYTVRHQHDd$ .  ?- wound looks clean and intact without signs of infection ? ?History of CVA (cerebrovascular accident) ?CT head nonacute, no definite focal deficits on limited exam.  ?- Continue ASA, statin, apixaban ?Repeat CT brain 4/22--neg for acute findings ? ?Iron deficiency anemia ?- Continue iron supplement ? ?Essential hypertension ?Initially Holding metoprolol due to hypotension>>restarted 4/21 ? ? ? ? ? ?Family Communication:   niece updated 4/24 ? ?Consultants:  renal ? ?Code Status:  FULL ? ?DVT Prophylaxis:  apixaban ? ? ?Procedures: ?As Listed in Progress Note Above ? ?Antibiotics: ?Ceftriaxone  4/18>>4/22 ?Azithro 4/18>>4/22 ?  ? ?RN Pressure Injury Documentation: ?Pressure Injury 03/04/21 Foot Anterior;Right Stage 1 -  Intact skin with non-blanchable redness of a localized area usually over a bony prominence. 2cm x 1cm stage 2 to fot (Active)  ?03/04/21 2009  ?Location: Foot  ?Location Orientation: Anterior;Right  ?Staging: Stage 1 -  Intact skin with non-blanchable redness of a localized area usually over a bony prominence.  ?Wound Description (Comments): 2cm x 1cm stage 2 to fot  ?Present on Admission: Yes  ?   ?Pressure Injury 03/04/21 Thigh Anterior;Left Unstageable - Full thickness tissue loss in which the base of the injury is covered by slough (yellow, tan, gray, green or brown) and/or eschar (tan, brown or black) in the wound bed. 1 cm x 1 cm unstageable t (Active)  ?03/04/21 2009  ?Location: Thigh  ?Location Orientation: Anterior;Left  ?Staging: Unstageable - Full thickness tissue loss in which the base of the injury is covered by slough (yellow, tan, gray, green or brown) and/or eschar (tan, brown or black) in the wound bed.  ?Wound Description (Comments): 1 cm x 1 cm unstageable to left hip  ?Present on Admission: Yes  ?   ?Pressure Injury 04/13/21 Thigh Anterior;Right;Medial Stage 2 -  Partial thickness loss of dermis presenting as a shallow open injury with a red, pink wound bed without slough. area of broken skin on inner thigh. (Active)  ?04/13/21 1115  ?Location: Thigh  ?Location Orientation: Anterior;Right;Medial  ?Staging: Stage 2 -  Partial thickness loss of dermis presenting as a shallow open injury with a red, pink wound bed without slough.  ?Wound Description (Comments): area of  broken skin on inner thigh.  ?Present on Admission:   ? ? ? ? ? ? ?Subjective: ?Patient denies fevers, chills, headache, chest pain, dyspnea, nausea, vomiting, diarrhea, abdominal pain, dysuria, hematuria, hematochezia, and melena. ? ? ?Objective: ?Vitals:  ? 06/06/21 0700 06/06/21 0741 06/06/21 0800 06/06/21  0900  ?BP: 103/66  (!) 112/52 112/62  ?Pulse:    70  ?Resp: _0 ?Temp: 97.8 ?F (36.6 ?C)     ?TempSrc: Oral     ?SpO2:  98%  100%  ?Weight:      ?Height:      ? ? ?Intake/Output Summary (Last 24 hours) at 4

## 2021-06-06 NOTE — TOC Progression Note (Addendum)
Transition of Care (TOC) - Progression Note  ? ? ?Patient Details  ?[name] ?MRN: [id number] ?Date of Birth: [birth year] ? ?Transition of Care (TOC) CM/SW Contact  ?Salome Arnt, LCSW ?Phone Number: ?06/06/2021, 10:14 AM ? ?Clinical Narrative:  Per MD, anticipate d/c tomorrow after dialysis. [facility] SNF updated. TOC will continue to follow.   ? ?Update: SNF requesting to see if pt can get SNF authorization. Pt was long term prior to admission. MD will order PT and CMA start authorization after evaluation completed. SNF will accept back when ready whether auth is received or not.  ? ? ?Expected Discharge Plan: [place] ?Barriers to Discharge: Continued Medical Work up ? ?Expected Discharge Plan and Services ?Expected Discharge Plan: [place] ?In-house Referral: Clinical Social Work ?  ?  ?Living arrangements for the past 2 months: [place] ?                ?  ?  ?  ?  ?  ?  ?  ?  ?  ?  ? ? ?Social Determinants of Health (SDOH) Interventions ?  ? ?Readmission Risk Interventions ? ?  06/01/2021  ? 10:59 AM 12/30/2019  ?  1:03 PM 10/27/2019  ?  1:19 PM  ?Readmission Risk Prevention Plan  ?Transportation Screening Complete Complete Complete  ?PCP or Specialist Appt within 5-7 Days   Complete  ?Home Care Screening   Complete  ?Medication Review (RN CM)   Complete  ?[place] or Home Care Consult  Complete   ?Social Work Consult for [place] Planning/Counseling  Complete   ?Palliative Care Screening  Not Complete   ?Medication Review Press photographer) Complete Complete   ?[place] or Home Care Consult Complete    ?SW Recovery Care/Counseling Consult Complete    ?Palliative Care Screening Not Applicable    ?Skilled Nursing Facility Complete    ? ? ?

## 2021-06-06 NOTE — Progress Notes (Signed)
EEG completed, results pending. 

## 2021-06-06 NOTE — Evaluation (Signed)
Physical Therapy Evaluation ?Patient Details ?[name] ?MRN: [id number] ?DOB: 1940-11-23 ?Today's Date: 06/06/2021 ? ?History of Present Illness ? Blia Totman is an 81 y.o. female with medical history significant of stroke, ESRD, COPD on 2L O2, HFpEF, and PAD with recent left great toe osteomyelitis treated with amputation who [facility]. She was picked up at her nursing facility to go to routine HD and became responsive only to sternal rub so was transferred to the ED instead. She was found to be hypoxic and hypercarbic requiring BiPAP. With BiPAP her mentation has improved. She is able to give a limited history, reports a recent history of coughing and feeling unwell generally but denies shortness of breath or chest pain. Denies recent wheezing. ?  ?Clinical Impression ? Patient demonstrates slow labored movement for sitting up at bedside requiring Mod/max assist to pull self to sitting, unable to stand due to BLE weakness and demonstrates good return for using BLE to help scoot self to [facility] with bed in head down position.  Patient will benefit from continued skilled physical therapy in hospital and recommended venue below to increase strength, balance, endurance for safe ADLs and gait.  ?   ?   ? ?Recommendations for follow up therapy are one component of a multi-disciplinary discharge planning process, led by the attending physician.  Recommendations may be updated based on patient status, additional functional criteria and insurance authorization. ? ?Follow Up Recommendations Skilled nursing-short term rehab (<3 hours/day) ? ?  ?Assistance Recommended at Discharge Intermittent Supervision/Assistance  ?Patient can return home with the following ? A lot of help with bathing/dressing/bathroom;A lot of help with walking and/or transfers;Help with stairs or ramp for entrance;Assistance with cooking/housework ? ?  ?Equipment Recommendations None recommended by PT   ?Recommendations for Other Services ?    ?  ?Functional Status Assessment Patient has had a recent decline in their functional status and demonstrates the ability to make significant improvements in function in a reasonable and predictable amount of time.  ? ?  ?Precautions / Restrictions Precautions ?Precautions: Fall ?Restrictions ?Weight Bearing Restrictions: No  ? ?  ? ?Mobility ? Bed Mobility ?Overal bed mobility: Needs Assistance ?Bed Mobility: Supine to Sit, Sit to Supine ?  ?  ?Supine to sit: Mod assist, Max assist ?Sit to supine: Mod assist, Max assist ?  ?General bed mobility comments: slow labored movement ?  ? ?Transfers ?  ?  ?  ?  ?  ?  ?  ?  ?  ?  ?  ? ?Ambulation/Gait ?  ?  ?  ?  ?  ?  ?  ?  ? ?Stairs ?  ?  ?  ?  ?  ? ?Wheelchair Mobility ?  ? ?Modified Rankin (Stroke Patients Only) ?  ? ?  ? ?Balance Overall balance assessment: Needs assistance ?Sitting-balance support: Feet supported, No upper extremity supported ?Sitting balance-Leahy Scale: Fair ?Sitting balance - Comments: seated at EOB ?  ?  ?  ?Standing balance comment: unable to stand ?  ?  ?  ?  ?  ?  ?  ?  ?  ?  ?  ?   ? ? ? ?Pertinent Vitals/Pain Pain Assessment ?Pain Assessment: No/denies pain  ? ? ?Home Living Family/patient expects to be discharged to:: Private residence ?Living Arrangements: Alone ?Available Help at Discharge: Family;Available PRN/intermittently;Personal care attendant ?Type of Home: House ?Home Access: Level entry ?  ?  ?  ?Home Layout: One level ?  Home Equipment: Conservation officer, nature (2 wheels);Cane - single point;BSC/3in1;Shower seat - built in;Rollator (4 wheels);Grab bars - tub/shower;Grab bars - toilet ?   ?  ?Prior Function Prior Level of Function : Needs assist ?  ?  ?  ?Physical Assist : Mobility (physical);ADLs (physical) ?Mobility (physical): Bed mobility;Transfers;Gait;Stairs ?  ?Mobility Comments: SNF staff was using [place] for transfers ?ADLs Comments: assisted by SNF staff ?  ? ? ?Hand Dominance  ? Dominant  Hand: Right ? ?  ?Extremity/Trunk Assessment  ? Upper Extremity Assessment ?Upper Extremity Assessment: Generalized weakness ?  ? ?Lower Extremity Assessment ?Lower Extremity Assessment: Generalized weakness ?  ? ?Cervical / Trunk Assessment ?Cervical / Trunk Assessment: Normal  ?Communication  ? Communication: No difficulties  ?Cognition Arousal/Alertness: Awake/alert ?Behavior During Therapy: [facility] for tasks assessed/performed ?Overall Cognitive Status: No family/caregiver present to determine baseline cognitive functioning ?  ?  ?  ?  ?  ?  ?  ?  ?  ?  ?  ?  ?  ?  ?  ?  ?  ?  ?  ? ?  ?General Comments   ? ?  ?Exercises    ? ?Assessment/Plan  ?  ?PT Assessment Patient needs continued PT services  ?PT Problem List Decreased strength;Decreased activity tolerance;Decreased balance;Decreased mobility ? ?   ?  ?PT Treatment Interventions DME instruction;Gait training;Stair training;Functional mobility training;Therapeutic activities;Therapeutic exercise;Patient/family education;Balance training   ? ?PT Goals (Current goals can be found in the Care Plan section)  ?Acute Rehab PT Goals ?Patient Stated Goal: return home ?PT Goal Formulation: With patient ?Time For Goal Achievement: 06/20/21 ?Potential to Achieve Goals: Fair ? ?  ?Frequency Min 2X/week ?  ? ? ?Co-evaluation   ?  ?  ?  ?  ? ? ?  ?AM-PAC PT "6 Clicks" Mobility  ?Outcome Measure Help needed turning from your back to your side while in a flat bed without using bedrails?: A Lot ?Help needed moving from lying on your back to sitting on the side of a flat bed without using bedrails?: A Lot ?Help needed moving to and from a bed to a chair (including a wheelchair)?: Total ?Help needed standing up from a chair using your arms (e.g., wheelchair or bedside chair)?: Total ?Help needed to walk in hospital room?: Total ?Help needed climbing 3-5 steps with a railing? : Total ?6 Click Score: 8 ? ?  ?End of Session Equipment Utilized During Treatment: Oxygen ?Activity  Tolerance: Patient tolerated treatment well;Patient limited by fatigue ?Patient left: in bed ?Nurse Communication: Mobility status ?PT Visit Diagnosis: Unsteadiness on feet (R26.81);Other abnormalities of gait and mobility (R26.89);Muscle weakness (generalized) (M62.81) ?  ? ?Time: 3662-9476 ?PT Time Calculation (min) (ACUTE ONLY): 20 min ? ? ?Charges:   PT Evaluation ?$PT Eval Moderate Complexity: 1 Mod ?PT Treatments ?$Therapeutic Exercise: 8-22 mins ?$Therapeutic Activity: 8-22 mins ?  ?   ? ? ?3:57 PM, 06/06/21 ?Lonell Grandchild, MPT ?Physical Therapist with [place] ?[facility] ?[phone] office ?[phone] mobile phone ? ? ?

## 2021-06-06 NOTE — Progress Notes (Signed)
Patient ID: Jody [name], female   DOB: 1940/02/22, 81 y.o.   MRN: [id number] ?S:Was over weekend for agonal respirations, but now extubated.  ECHO without pericardial effusion and no further workup per Cardiology. ?O:BP (!) 116/59   Pulse 66   Temp 97.8 ?F (36.6 ?C) (Oral)   Resp 14   Ht '5\' 7"'$  (1.702 m)   Wt 83.9 kg   SpO2 98%   BMI 28.97 kg/m?  ? ?Intake/Output Summary (Last 24 hours) at 06/06/2021 0855 ?Last data filed at 06/06/2021 0400 ?Gross per 24 hour  ?Intake 419.77 ml  ?Output --  ?Net 419.77 ml  ? ?Intake/Output: ?I/O last 3 completed shifts: ?In: 585 [P.O.:360; I.V.:159; IV Piggyback:66] ?Out: 3000 [Other:3000] ? Intake/Output this shift: ? No intake/output data recorded. ?Weight change: -2.4 kg ?Gen:NAD ?CVS: IRR IRR ?Resp:scattered rhonchi ?Abd:+BS, soft, NT/ND ?Ext: no edema, RUE AVF +T/B ? ?Recent Labs  ?Lab 05/31/21 ?1236 05/31/21 ?1612 06/01/21 ?0420 06/03/21 ?7408 06/03/21 ?1853 06/04/21 ?1448 06/04/21 ?1856 06/05/21 ?3149 06/06/21 ?0403  ?NA 135 133* 136 134* 133* 133* 132* 135 136  ?K 4.2 4.2 4.3 4.2 4.0 3.7 3.8 4.1 3.4*  ?CL 96* 95* 99 95* 94* 96* 95* 97* 96*  ?CO'2 28 29 23 28 26 24 25 28 27 '$  ?GLUCOSE 106* 105* 103* 167* 248* 187* 186* 155* 122*  ?BUN 50* 54* 34* 46* 50* 56* 58* 35* 46*  ?CREATININE 4.42* 4.74* 3.10* 3.65* 3.88* 4.05* 4.18* 2.71* 3.43*  ?ALBUMIN 3.7 3.2*  --  4.2 4.1 3.7 3.6 3.6 3.8  ?CALCIUM 9.9 9.4 9.0 10.0 9.7 10.1 9.9 9.6 9.7  ?PHOS  --  5.5*  --  4.3 4.3 2.6  --   --   --   ?AST 20  --   --   --   --  55* 55* 68* 49*  ?ALT 15  --   --   --   --  100* 101* 105* 109*  ? ?Liver Function Tests: ?Recent Labs  ?Lab 06/04/21 ?0905 06/05/21 ?7026 06/06/21 ?0403  ?AST 55* 68* 49*  ?ALT 101* 105* 109*  ?ALKPHOS 87 72 87  ?BILITOT 0.8 1.1 0.5  ?PROT 6.2* 6.2* 6.6  ?ALBUMIN 3.6 3.6 3.8  ? ?No results for input(s): LIPASE, AMYLASE in the last 168 hours. ?Recent Labs  ?Lab 05/31/21 ?1236 06/04/21 ?1220  ?AMMONIA 24 26  ? ?CBC: ?Recent Labs  ?Lab 05/31/21 ?1236 05/31/21 ?1612  06/03/21 ?1853 06/04/21 ?3785 06/04/21 ?8850 06/05/21 ?2774 06/06/21 ?0403  ?WBC 5.1   < > 4.7 4.4 4.4 4.6 7.3  ?NEUTROABS 4.0  --   --   --   --   --   --   ?HGB 9.1*   < > 8.3* 8.1* 8.4* 8.7* 9.3*  ?HCT 30.5*   < > 29.2* 26.6* 26.6* 27.3* 31.3*  ?MCV 113.0*   < > 113.2* 106.0* 106.4* 105.4* 112.6*  ?PLT 111*   < > 87* 76* 76* 76* 106*  ? < > = values in this interval not displayed.  ? ?Cardiac Enzymes: ?No results for input(s): CKTOTAL, CKMB, CKMBINDEX, TROPONINI in the last 168 hours. ?CBG: ?Recent Labs  ?Lab 06/05/21 ?0004 06/05/21 ?0413 06/05/21 ?1287 06/05/21 ?1123 06/05/21 ?8676  ?GLUCAP 155* 158* 148* 132* 142*  ? ? ?Iron Studies: No results for input(s): IRON, TIBC, TRANSFERRIN, FERRITIN in the last 72 hours. ?Studies/Results: ?CT HEAD WO CONTRAST (5MM) ? ?Result Date: 06/04/2021 ?CLINICAL DATA:  Provided history: Mental status change, unknown cause. Acute mental status change with  agonal respirations. EXAM: CT HEAD WITHOUT CONTRAST TECHNIQUE: Contiguous axial images were obtained from the base of the skull through the vertex without intravenous contrast. RADIATION DOSE REDUCTION: This exam was performed according to the departmental dose-optimization program which includes automated exposure control, adjustment of the mA and/or kV according to patient size and/or use of iterative reconstruction technique. COMPARISON:  Prior head CT examinations 05/31/2021 and earlier. FINDINGS: Brain: Mild generalized cerebral atrophy. Redemonstrated chronic cortical/subcortical infarcts within the right parietal and occipital lobes. Background moderate to advanced patchy and ill-defined hypoattenuation within the cerebral white matter, nonspecific but compatible with chronic small vessel ischemic disease. There is no acute intracranial hemorrhage. No acute demarcated cortical infarct. No extra-axial fluid collection. No evidence of an intracranial mass. No midline shift. Vascular: No hyperdense vessel.  Atherosclerotic  calcifications. Skull: Normal. Negative for fracture or focal lesion. Sinuses/Orbits: Visualized orbits show no acute finding. Small mucous retention cyst within the left sphenoid sinus. Other: Small-volume fluid within the left mastoid air cells. Partially visualized life-support tubes. IMPRESSION: No evidence of acute intracranial abnormality. Redemonstrated chronic cortical/subcortical infarcts within the right parietal and occipital lobes. Background moderate to advanced chronic small vessel ischemic changes within the cerebral white matter. Mild generalized cerebral atrophy. Electronically Signed   By: Kellie Simmering D.O.   On: 06/04/2021 13:16   ? apixaban  2.5 mg Oral BID  ? aspirin  81 mg Oral Daily  ? atorvastatin  40 mg Oral Daily  ? budesonide (PULMICORT) nebulizer solution  0.5 mg Nebulization BID  ? Chlorhexidine Gluconate Cloth  6 each Topical Q0600  ? darbepoetin (ARANESP) injection - DIALYSIS  60 mcg Intravenous Q Wed-HD  ? feeding supplement (NEPRO CARB STEADY)  237 mL Oral BID BM  ? ferrous sulfate  300 mg Oral BID WC  ? folic acid  1 mg Oral Daily  ? ipratropium  0.5 mg Nebulization Q6H  ? levalbuterol  0.63 mg Nebulization Q6H  ? metoprolol tartrate  12.5 mg Oral BID  ? multivitamin  1 tablet Oral QHS  ? predniSONE  50 mg Oral Q breakfast  ? sevelamer carbonate  800 mg Oral TID WC  ? sodium chloride flush  3 mL Intravenous Q12H  ? cyanocobalamin  100 mcg Oral Daily  ? ? ?BMET ?   ?Component Value Date/Time  ? NA 136 06/06/2021 0403  ? K 3.4 (L) 06/06/2021 0403  ? CL 96 (L) 06/06/2021 0403  ? CO2 27 06/06/2021 0403  ? GLUCOSE 122 (H) 06/06/2021 0403  ? BUN 46 (H) 06/06/2021 0403  ? CREATININE 3.43 (H) 06/06/2021 0403  ? CALCIUM 9.7 06/06/2021 0403  ? GFRNONAA 13 (L) 06/06/2021 0403  ? GFRAA 9 (L) 10/27/2019 0112  ? ?CBC ?   ?Component Value Date/Time  ? WBC 7.3 06/06/2021 0403  ? RBC 2.78 (L) 06/06/2021 0403  ? HGB 9.3 (L) 06/06/2021 0403  ? HCT 31.3 (L) 06/06/2021 0403  ? PLT 106 (L) 06/06/2021  0403  ? MCV 112.6 (H) 06/06/2021 0403  ? MCH 33.5 06/06/2021 0403  ? MCHC 29.7 (L) 06/06/2021 0403  ? RDW 19.9 (H) 06/06/2021 0403  ? LYMPHSABS 0.7 05/31/2021 1236  ? MONOABS 0.3 05/31/2021 1236  ? EOSABS 0.1 05/31/2021 1236  ? BASOSABS 0.0 05/31/2021 1236  ? ?  ?Dialysis Orders: Center: [facility]  on TTS . ?EDW 82.5Kg HD Bath 2K/2.5Ca  Time 3:15 Heparin none. Access RUE AVF BFR 400 DFR 500    ?Micera 200 mcg IV every 2 weeks ?  ?  Assessment/Plan: ? Acute hypoxic respiratory failure with hypercapnia - due to PNA and COPD exacerbation.  Improved with BiPap and HD with UF but intubated on 06/03/21 due to agonal respirations.  Extubated 06/05/21.  Currently on 2 liters via [place] which is her baseline. ? Sepsis - due to LLL pneumonia.  Currently off of levophed. ? LLL Pneumonia - started on azithromycin, ceftriaxone per primary svc. ? ESRD -  Had HD on 4/18 and 4/19 with UF of 6 liters and improved oxygenation.  Off of schedule.  Had HD on 06/03/21. Will plan for HD 4/23.  Will plan for HD on her regular outpatient schedule for Tuesday if still here.  ? Hypertension/volume  - as above, still 5 kg above edw.  Will plan for session of HD tomorrow and get back on outpatient schedule Saturday.  Hold home bp meds given hypotension in setting of sepsis. ? Anemia  - Hgb slowly dropping.  Given Aranesp with HD on 06/01/21.  Transfuse for Hgb <7. ? Metabolic bone disease -  resume home meds when able to take po. ? Nutrition - renal diet, carb modified, when able. ? Disposition -  to transfer back to SNF when stable per primary svc. ? ?Donetta Potts, MD ?[facility] ?

## 2021-06-06 NOTE — Procedures (Signed)
Patient [name]  ?MRN: [id number]  ?Epilepsy Attending: Lora Havens  ?Referring Physician/Provider: Orson Eva, MD ?Date: 06/06/2021  ?Duration: 22.30 mins ? ?Patient history: 81yo f with transient alteration of awareness. EEG to evaluate for seizure. ? ?Level of alertness: Awake, asleep ? ?AEDs during EEG study: None ? ?Technical aspects: This EEG study was done with scalp electrodes positioned according to the 10-20 International system of electrode placement. Electrical activity was acquired at a sampling rate of '500Hz'$  and reviewed with a high frequency filter of '70Hz'$  and a low frequency filter of '1Hz'$ . EEG data were recorded continuously and digitally stored.  ? ?Description: The posterior dominant rhythm consists of 8-9 Hz activity of moderate voltage (25-35 uV) seen predominantly in posterior head regions, symmetric and reactive to eye opening and eye closing. Sleep was characterized by vertex waves, sleep spindles (12 to 14 Hz), maximal frontocentral region. EEG showed intermittent generalized 3 to 6 Hz theta-delta slowing. Hyperventilation and photic stimulation were not performed.    ? ?ABNORMALITY ?- Intermittent slow, generalized ? ?IMPRESSION: ?This study is suggestive of mild diffuse encephalopathy, nonspecific etiology. No seizures or epileptiform discharges were seen throughout the recording. ? ?Lora Havens  ? ?

## 2021-06-07 DIAGNOSIS — R627 Adult failure to thrive: Secondary | ICD-10-CM

## 2021-06-07 LAB — CBC WITH DIFFERENTIAL/PLATELET
Abs Immature Granulocytes: 0.03 K/uL (ref 0.00–0.07)
Basophils Absolute: 0 K/uL (ref 0.0–0.1)
Basophils Relative: 0 %
Eosinophils Absolute: 0 K/uL (ref 0.0–0.5)
Eosinophils Relative: 0 %
HCT: 27.6 % — ABNORMAL LOW (ref 36.0–46.0)
Hemoglobin: 8.5 g/dL — ABNORMAL LOW (ref 12.0–15.0)
Immature Granulocytes: 0 %
Lymphocytes Relative: 6 %
Lymphs Abs: 0.4 K/uL — ABNORMAL LOW (ref 0.7–4.0)
MCH: 34.1 pg — ABNORMAL HIGH (ref 26.0–34.0)
MCHC: 30.8 g/dL (ref 30.0–36.0)
MCV: 110.8 fL — ABNORMAL HIGH (ref 80.0–100.0)
Monocytes Absolute: 0.4 K/uL (ref 0.1–1.0)
Monocytes Relative: 5 %
Neutro Abs: 6.6 K/uL (ref 1.7–7.7)
Neutrophils Relative %: 89 %
Platelets: 97 K/uL — ABNORMAL LOW (ref 150–400)
RBC: 2.49 MIL/uL — ABNORMAL LOW (ref 3.87–5.11)
RDW: 19.9 % — ABNORMAL HIGH (ref 11.5–15.5)
WBC: 7.4 K/uL (ref 4.0–10.5)
nRBC: 0.4 % — ABNORMAL HIGH (ref 0.0–0.2)

## 2021-06-07 LAB — RENAL FUNCTION PANEL
Albumin: 3.8 g/dL (ref 3.5–5.0)
Anion gap: 11 (ref 5–15)
BUN: 65 mg/dL — ABNORMAL HIGH (ref 8–23)
CO2: 27 mmol/L (ref 22–32)
Calcium: 9.8 mg/dL (ref 8.9–10.3)
Chloride: 95 mmol/L — ABNORMAL LOW (ref 98–111)
Creatinine, Ser: 4.39 mg/dL — ABNORMAL HIGH (ref 0.44–1.00)
GFR, Estimated: 9 mL/min — ABNORMAL LOW (ref >60)
Glucose, Bld: 167 mg/dL — ABNORMAL HIGH (ref 70–99)
Phosphorus: 4.8 mg/dL — ABNORMAL HIGH (ref 2.5–4.6)
Potassium: 3.9 mmol/L (ref 3.5–5.1)
Sodium: 133 mmol/L — ABNORMAL LOW (ref 135–145)

## 2021-06-07 LAB — CBC
HCT: 27.3 % — ABNORMAL LOW (ref 36.0–46.0)
Hemoglobin: 8.7 g/dL — ABNORMAL LOW (ref 12.0–15.0)
MCH: 33.6 pg (ref 26.0–34.0)
MCHC: 31.9 g/dL (ref 30.0–36.0)
MCV: 105.4 fL — ABNORMAL HIGH (ref 80.0–100.0)
Platelets: 76 10*3/uL — ABNORMAL LOW (ref 150–400)
RBC: 2.59 MIL/uL — ABNORMAL LOW (ref 3.87–5.11)
RDW: 19.2 % — ABNORMAL HIGH (ref 11.5–15.5)
WBC: 4.6 10*3/uL (ref 4.0–10.5)
nRBC: 0.7 % — ABNORMAL HIGH (ref 0.0–0.2)

## 2021-06-07 MED ORDER — RENA-VITE PO TABS
1.0000 | ORAL_TABLET | Freq: Every day | ORAL | 0 refills | Status: DC
Start: 2021-06-07 — End: 2021-07-04

## 2021-06-07 MED ORDER — MIDODRINE HCL 5 MG PO TABS
10.0000 mg | ORAL_TABLET | Freq: Once | ORAL | Status: AC
  Administered 2021-06-07: 10 mg via ORAL

## 2021-06-07 MED ORDER — NEPRO/CARBSTEADY PO LIQD: 237.0000 mL | Freq: Two times a day (BID) | ORAL | 0 refills | Status: DC

## 2021-06-07 MED ORDER — FOLIC ACID 1 MG PO TABS: 1.0000 mg | ORAL_TABLET | Freq: Every day | ORAL | Status: DC

## 2021-06-07 MED ORDER — MIDODRINE HCL 10 MG PO TABS: 10.0000 mg | ORAL_TABLET | Freq: Once | ORAL | 0 refills | Status: AC

## 2021-06-07 MED ORDER — MIDODRINE HCL 5 MG PO TABS
ORAL_TABLET | ORAL | Status: AC
  Filled 2021-06-07: qty 2

## 2021-06-07 NOTE — Evaluation (Signed)
Clinical/Bedside Swallow Evaluation ?Patient Details  ?[name] ?MRN: [id number] ?Date of Birth: 01-26-1941 ? ?Today's Date: 06/07/2021 ?Time: SLP Start Time (ACUTE ONLY): 1011 SLP Stop Time (ACUTE ONLY): 1021 ?SLP Time Calculation (min) (ACUTE ONLY): 10 min ? ?Past Medical History:  ?Past Medical History:  ?Diagnosis Date  ? Anemia in chronic kidney disease (CODE) 06/05/2015  ? Arthritis   ? "bad in my legs" (12/07/2014)  ? Chronic atrial fibrillation ([place]) 06/05/2015  ? Chronic back pain   ? "dr said my spine is crooked"  ? Chronic bronchitis ([place])   ? "get it q yr" (12/07/2014)  ? CKD stage 4 due to type 2 diabetes mellitus ([place]) 06/05/2015  ? Complication of anesthesia   ? headache for 2 days after surgery in May 2019  ? COPD (chronic obstructive pulmonary disease) ([place])   ? Gait difficulty 09/02/2014  ? Hypercholesterolemia   ? Hypertension   ? Neuropathy 2015  ? in legs  ? NSVT (nonsustained ventricular tachycardia) ([place])   ? Sinus brady-tachy syndrome ([place])   ? Stroke [facility]) 05/2014  ? denies residual on 12/07/2014  ? Type II diabetes mellitus ([place])   ? ?Past Surgical History:  ?Past Surgical History:  ?Procedure Laterality Date  ? ABDOMINAL AORTOGRAM W/LOWER EXTREMITY Left 04/06/2021  ? Procedure: ABDOMINAL AORTOGRAM W/LOWER EXTREMITY;  Surgeon: Broadus John, MD;  Location: [place] [facility];  Service: Cardiovascular;  Laterality: Left;  ? ABDOMINAL HYSTERECTOMY  ~ 1970  ? AMPUTATION TOE Left 04/08/2021  ? Procedure: FIRST TOE LEFT FOOT AMPUTATION;  Surgeon: Trula Slade, DPM;  Location: [place];  Service: Podiatry;  Laterality: Left;  ? APPENDECTOMY  ~ 1970  ? AV FISTULA PLACEMENT Right 07/06/2017  ? Procedure: RIGHT RADIOCEPHALIC ARTERIOVENOUS FISTULA creation;  Surgeon: Conrad [place], MD;  Location: [place];  Service: Vascular;  Laterality: Right;  ? BASCILIC VEIN TRANSPOSITION Right 09/17/2017  ? Procedure: SECOND STAGE BASILIC VEIN TRANSPOSITION RIGHT ARM;  Surgeon: Rosetta Posner, MD;  Location:  [place];  Service: Vascular;  Laterality: Right;  ? COLONOSCOPY N/A 10/28/2017  ? Procedure: COLONOSCOPY;  Surgeon: Rogene Houston, MD;  [facility];  Service: Endoscopy;  Laterality: N/A;  ? COLONOSCOPY WITH PROPOFOL N/A 12/29/2019  ? Procedure: COLONOSCOPY WITH PROPOFOL;  Surgeon: Eloise Harman, DO;  [facility];  Service: Endoscopy;  Laterality: N/A;  ? ESOPHAGOGASTRODUODENOSCOPY (EGD) WITH PROPOFOL N/A 10/31/2017  ? Procedure: ESOPHAGOGASTRODUODENOSCOPY (EGD) WITH PROPOFOL;  Surgeon: Rogene [place], MD;  [facility];  Service: Endoscopy;  Laterality: N/A;  ? JOINT REPLACEMENT    ? PERIPHERAL VASCULAR BALLOON ANGIOPLASTY Left 04/06/2021  ? Procedure: PERIPHERAL VASCULAR BALLOON ANGIOPLASTY;  Surgeon: Broadus John, MD;  Location: [place] [facility];  Service: Cardiovascular;  Laterality: Left;  left sfa succesful ?[place] AT Unsuccesful  ? TOTAL KNEE ARTHROPLASTY Right ~ 1986  ? TUMOR EXCISION  ~ 1970  ? "9# in my stomach"  ? ?HPI:  ?Jody [name] is an 81 y.o. female with medical history significant of stroke, ESRD, COPD on 2L O2, HFpEF, and PAD with recent left great toe osteomyelitis treated with amputation who [facility]responsiveness. She was picked up at her nursing facility to go to routine HD and became responsive only to sternal rub so was transferred to the ED instead. She was found to be hypoxic and hypercarbic requiring BiPAP. With BiPAP her mentation has improved. She is able to give a limited  history, reports a recent history of coughing and feeling unwell generally but denies shortness of breath or chest pain. Denies recent wheezing.      CXR demonstrated hypoventilation with streaky bibasilar opacities with enlarged cardiac silhouette and left lower lobe opacity consistent with pneumonia. CT head revealed atrophy and chronic infarcts without acute pathology. Blood cultures were drawn, antibiotics and IV fluids administered, and  hospitalists consulted for admission. BSE completed (06/01/21) last week with recommendation for D2/thin and no ST f/u, however she was intubated (06/03/21) and then extubated (06/05/21) during this hospitalization and is now experiencing some hoarse vocal quality and difficulty swallowing large pills per chart review. BSE again requested.  ?  ?Assessment / Plan / Recommendation  ?Clinical Impression ? Repeat BSE requested post intubation/extubation. Pt presents with slight hoarse vocal quality and reports of difficulty with large pills with liquid (BSE completed 4/19 recommended administration whole with puree)- Per Pt and RN after pills were administered whole with puree she had no further difficulty. Pt consumed thin liquids and puree textures without overt s/sx of aspiration. Pt pleasantly declined regular textures reporting she prefers soft foods. Recommend continue with D2/fine chop diet and thin liquids; recommend meds be administered whole with puree. There are no further ST needs noted at this time, ST will sign off. Thank you, ?SLP Visit Diagnosis: Dysphagia, unspecified (R13.10) ?   ?Aspiration Risk ? Mild aspiration risk  ?  ?Diet Recommendation Dysphagia 2 (Fine chop);Thin liquid  ? ?Medication Administration: Whole meds with puree ?Supervision: Staff to assist with self feeding;Full supervision/cueing for compensatory strategies ?Compensations: Slow rate;Small sips/bites  ?  ?Other  Recommendations Oral Care Recommendations: Oral care BID;Staff/trained caregiver to provide oral care ?Other Recommendations: Clarify dietary restrictions   ? ?Recommendations for follow up therapy are one component of a multi-disciplinary discharge planning process, led by the attending physician.  Recommendations may be updated based on patient status, additional functional criteria and insurance authorization. ? ?Follow up Recommendations No SLP follow up  ? ? ?  ?Assistance Recommended at Discharge Frequent or constant  Supervision/Assistance  ?Functional Status Assessment Patient has not had a recent decline in their functional status  ?   ?   ? ?Prognosis Prognosis for Safe Diet Advancement: Good  ? ?  ? ?Swallow Study   ?General Date of Onset: 05/31/21 ?HPI: Jody [name] is an 81 y.o. female with medical history significant of stroke, ESRD, COPD on 2L O2, HFpEF, and PAD with recent left great toe osteomyelitis treated with amputation who [facility]. She was picked up at her nursing facility to go to routine HD and became responsive only to sternal rub so was transferred to the ED instead. She was found to be hypoxic and hypercarbic requiring BiPAP. With BiPAP her mentation has improved. She is able to give a limited history, reports a recent history of coughing and feeling unwell generally but denies shortness of breath or chest pain. Denies recent wheezing.      CXR demonstrated hypoventilation with streaky bibasilar opacities with enlarged cardiac silhouette and left lower lobe opacity consistent with pneumonia. CT head revealed atrophy and chronic infarcts without acute pathology. Blood cultures were drawn, antibiotics and IV fluids administered, and hospitalists consulted for admission.BSE completed (06/01/21) last week with recommendation for D2/thin and no ST f/u, however she was intubated (06/03/21) and then extubated (06/05/21) during this hospitalization and is now experiencing some hoarse vocal quality and difficulty swallowing large pills per chart review. BSE again  requested. ?Type of Study: Bedside Swallow Evaluation ?Previous Swallow Assessment: BSE February 2023 D2/thin, BSE 06/01/21 ?Diet Prior to this Study: Thin liquids;Dysphagia 2 (chopped) ?Temperature Spikes Noted: No ?Respiratory Status: Nasal cannula ?History of Recent Intubation: Yes ?Length of Intubations (days): 3 days ?Date extubated: 06/05/21 ?Behavior/Cognition: Alert;Cooperative;Pleasant mood ?Oral Cavity  Assessment: Within Functional Limits ?Oral Care Completed by SLP: Recent completion by staff ?Oral Cavity - Dentition: Dentures, top;Dentures, bottom ?Vision: Functional for self-feeding ?Self-Feeding Abilities: Able to f

## 2021-06-07 NOTE — Progress Notes (Signed)
Patient ID: Jody [name], female   DOB: 05-11-1940, 81 y.o.   MRN: [id number] ?S:No events overnight.  No complaints this morning except "nobody does anything here". ?O:BP 136/81 (BP Location: Left Arm)   Pulse 85   Temp (!) 97.5 ?F (36.4 ?C) (Oral)   Resp 18   Ht '5\' 7"'$  (1.702 m)   Wt 81.1 kg Comment: standing scale  SpO2 97%   BMI 28.00 kg/m?  ? ?Intake/Output Summary (Last 24 hours) at 06/07/2021 0912 ?Last data filed at 06/07/2021 0500 ?Gross per 24 hour  ?Intake 480 ml  ?Output --  ?Net 480 ml  ? ?Intake/Output: ?I/O last 3 completed shifts: ?In: 960 [P.O.:960] ?Out: -  ? Intake/Output this shift: ? No intake/output data recorded. ?Weight change: -2.8 kg ?Gen:NAD ?CVS: RRR,  ?Resp:  occ rhonchi ?Abd:+BS, soft, NT/ND ?Ext: no edema RUE AVF +T/B ? ?Recent Labs  ?Lab 05/31/21 ?1236 05/31/21 ?1612 06/01/21 ?0420 06/03/21 ?8850 06/03/21 ?1853 06/04/21 ?2774 06/04/21 ?1287 06/05/21 ?8676 06/06/21 ?0403  ?NA 135 133* 136 134* 133* 133* 132* 135 136  ?K 4.2 4.2 4.3 4.2 4.0 3.7 3.8 4.1 3.4*  ?CL 96* 95* 99 95* 94* 96* 95* 97* 96*  ?CO'2 28 29 23 28 26 24 25 28 27 '$  ?GLUCOSE 106* 105* 103* 167* 248* 187* 186* 155* 122*  ?BUN 50* 54* 34* 46* 50* 56* 58* 35* 46*  ?CREATININE 4.42* 4.74* 3.10* 3.65* 3.88* 4.05* 4.18* 2.71* 3.43*  ?ALBUMIN 3.7 3.2*  --  4.2 4.1 3.7 3.6 3.6 3.8  ?CALCIUM 9.9 9.4 9.0 10.0 9.7 10.1 9.9 9.6 9.7  ?PHOS  --  5.5*  --  4.3 4.3 2.6  --   --   --   ?AST 20  --   --   --   --  55* 55* 68* 49*  ?ALT 15  --   --   --   --  100* 101* 105* 109*  ? ?Liver Function Tests: ?Recent Labs  ?Lab 06/04/21 ?0905 06/05/21 ?7209 06/06/21 ?0403  ?AST 55* 68* 49*  ?ALT 101* 105* 109*  ?ALKPHOS 87 72 87  ?BILITOT 0.8 1.1 0.5  ?PROT 6.2* 6.2* 6.6  ?ALBUMIN 3.6 3.6 3.8  ? ?No results for input(s): LIPASE, AMYLASE in the last 168 hours. ?Recent Labs  ?Lab 05/31/21 ?1236 06/04/21 ?1220  ?AMMONIA 24 26  ? ?CBC: ?Recent Labs  ?Lab 05/31/21 ?1236 05/31/21 ?1612 06/03/21 ?1853 06/04/21 ?4709 06/04/21 ?6283 06/05/21 ?6629  06/06/21 ?0403  ?WBC 5.1   < > 4.7 4.4 4.4 4.6 7.3  ?NEUTROABS 4.0  --   --   --   --   --   --   ?HGB 9.1*   < > 8.3* 8.1* 8.4* 8.7* 9.3*  ?HCT 30.5*   < > 29.2* 26.6* 26.6* 27.3* 31.3*  ?MCV 113.0*   < > 113.2* 106.0* 106.4* 105.4* 112.6*  ?PLT 111*   < > 87* 76* 76* 76* 106*  ? < > = values in this interval not displayed.  ? ?Cardiac Enzymes: ?No results for input(s): CKTOTAL, CKMB, CKMBINDEX, TROPONINI in the last 168 hours. ?CBG: ?Recent Labs  ?Lab 06/05/21 ?0004 06/05/21 ?0413 06/05/21 ?4765 06/05/21 ?1123 06/05/21 ?4650  ?GLUCAP 155* 158* 148* 132* 142*  ? ? ?Iron Studies: No results for input(s): IRON, TIBC, TRANSFERRIN, FERRITIN in the last 72 hours. ?Studies/Results: ?EEG adult ? ?Result Date: 06/06/2021 ?Lora Havens, MD     06/06/2021  8:54 PM Patient [name] MRN: [id number] Epilepsy Attending:  Lora Havens Referring Physician/Provider: Orson Eva, MD Date: 06/06/2021 Duration: 22.30 mins Patient history: 81yo f with transient alteration of awareness. EEG to evaluate for seizure. Level of alertness: Awake, asleep AEDs during EEG study: None Technical aspects: This EEG study was done with scalp electrodes positioned according to the 10-20 International system of electrode placement. Electrical activity was acquired at a sampling rate of '500Hz'$  and reviewed with a high frequency filter of '70Hz'$  and a low frequency filter of '1Hz'$ . EEG data were recorded continuously and digitally stored. Description: The posterior dominant rhythm consists of 8-9 Hz activity of moderate voltage (25-35 uV) seen predominantly in posterior head regions, symmetric and reactive to eye opening and eye closing. Sleep was characterized by vertex waves, sleep spindles (12 to 14 Hz), maximal frontocentral region. EEG showed intermittent generalized 3 to 6 Hz theta-delta slowing. Hyperventilation and photic stimulation were not performed.   ABNORMALITY - Intermittent slow, generalized IMPRESSION: This study is  suggestive of mild diffuse encephalopathy, nonspecific etiology. No seizures or epileptiform discharges were seen throughout the recording. Jody [name]   ? apixaban  2.5 mg Oral BID  ? aspirin  81 mg Oral Daily  ? atorvastatin  40 mg Oral Daily  ? budesonide (PULMICORT) nebulizer solution  0.5 mg Nebulization BID  ? cefdinir  300 mg Oral Daily  ? Chlorhexidine Gluconate Cloth  6 each Topical Q0600  ? darbepoetin (ARANESP) injection - DIALYSIS  60 mcg Intravenous Q Wed-HD  ? feeding supplement (NEPRO CARB STEADY)  237 mL Oral BID BM  ? ferrous sulfate  300 mg Oral BID WC  ? folic acid  1 mg Oral Daily  ? ipratropium  0.5 mg Nebulization Q6H  ? levalbuterol  0.63 mg Nebulization Q6H  ? metoprolol tartrate  12.5 mg Oral BID  ? multivitamin  1 tablet Oral QHS  ? predniSONE  50 mg Oral Q breakfast  ? sevelamer carbonate  800 mg Oral TID WC  ? sodium chloride flush  3 mL Intravenous Q12H  ? cyanocobalamin  100 mcg Oral Daily  ? ? ?BMET ?   ?Component Value Date/Time  ? NA 136 06/06/2021 0403  ? K 3.4 (L) 06/06/2021 0403  ? CL 96 (L) 06/06/2021 0403  ? CO2 27 06/06/2021 0403  ? GLUCOSE 122 (H) 06/06/2021 0403  ? BUN 46 (H) 06/06/2021 0403  ? CREATININE 3.43 (H) 06/06/2021 0403  ? CALCIUM 9.7 06/06/2021 0403  ? GFRNONAA 13 (L) 06/06/2021 0403  ? GFRAA 9 (L) 10/27/2019 0112  ? ?CBC ?   ?Component Value Date/Time  ? WBC 7.3 06/06/2021 0403  ? RBC 2.78 (L) 06/06/2021 0403  ? HGB 9.3 (L) 06/06/2021 0403  ? HCT 31.3 (L) 06/06/2021 0403  ? PLT 106 (L) 06/06/2021 0403  ? MCV 112.6 (H) 06/06/2021 0403  ? MCH 33.5 06/06/2021 0403  ? MCHC 29.7 (L) 06/06/2021 0403  ? RDW 19.9 (H) 06/06/2021 0403  ? LYMPHSABS 0.7 05/31/2021 1236  ? MONOABS 0.3 05/31/2021 1236  ? EOSABS 0.1 05/31/2021 1236  ? BASOSABS 0.0 05/31/2021 1236  ? ? ?Dialysis Orders: Center: [facility]  on TTS . ?EDW 82.5Kg HD Bath 2K/2.5Ca  Time 3:15 Heparin none. Access RUE AVF BFR 400 DFR 500    ?Micera 200 mcg IV every 2 weeks ?  ?Assessment/Plan: ? Acute  hypoxic respiratory failure with hypercapnia - due to PNA and COPD exacerbation.  Improved with BiPap and HD with UF but intubated on 06/03/21 due to agonal respirations.  Extubated  06/05/21.  Currently on 2 liters via [place] which is her baseline. ? Sepsis - due to LLL pneumonia.  Currently off of levophed. ? LLL Pneumonia - started on azithromycin, ceftriaxone per primary svc. ? ESRD -  Had HD on 4/18 and 4/19 with UF of 6 liters and improved oxygenation.  Off of schedule.  Had HD on 06/03/21.  Will plan for HD on her regular outpatient schedule of TTS. ? Hypertension/volume  -  Hold home bp meds given hypotension in setting of sepsis.  She is now below EDW.  UF with HD tomorrow and will need a new edw.  ? Anemia  - Hgb slowly dropping.  Given Aranesp with HD on 06/01/21.  Transfuse for Hgb <7. ? Metabolic bone disease -  resume home meds when able to take po. ? Nutrition - renal diet, carb modified, when able. ? Disposition -  to transfer back to SNF when stable per primary svc. ? ?Donetta Potts, MD ?[facility] ? ? ?

## 2021-06-07 NOTE — TOC Transition Note (Addendum)
Transition of Care (TOC) - CM/SW Discharge Note ? ? ?Patient Details  ?[name] ?MRN: [id number] ?Date of Birth: [birth year] ? ?Transition of Care (TOC) CM/SW Contact:  ?Boneta Lucks, RN ?Phone Number: ?06/07/2021, 3:00 PM ? ? ?Clinical Narrative:   Patient discharging to [facility] after dialysis today. RN to call report, TOC completed med necessity and will schedule EMS. CMA continuing to work on [employer] for rehab. [facility] will take back today either way. [facility] will refer to [facility] Palliative services.  ? ? ?Addendum:  EMS Scheduled,  [facility] wanted denied wanting a peer to peer. Patient completed dialysis and ready for discharge. [facility] updated, they will take back without AUTH, MD discharging.  ? ? ?Final next level of care: Mila Doce ?Barriers to Discharge: Continued Medical Work up ? ? ?Patient Goals and CMS Choice ?Patient states their goals for this hospitalization and ongoing recovery are:: to go to SNF ?CMS Medicare.gov Compare Post Acute Care list provided to:: Patient ?  ? ?Discharge Placement ?  ?           ?Patient chooses bed at:  [facility]) ?Patient to be transferred to facility by: EMS ?Name of family member notified: left message for niece ?Patient and family notified of of transfer: 06/07/21 ? ?Discharge Plan and Services ?In-house Referral: Clinical Social Work ?  ?         ? ?Readmission Risk Interventions ? ?  06/07/2021  ?  2:47 PM 06/01/2021  ? 10:59 AM 12/30/2019  ?  1:03 PM  ?Readmission Risk Prevention Plan  ?Transportation Screening Complete Complete Complete  ?[place] or Home Care Consult   Complete  ?Social Work Consult for [place] Planning/Counseling   Complete  ?Palliative Care Screening   Not Complete  ?Medication Review Press photographer) Complete Complete Complete  ?PCP or Specialist appointment within 3-5 days of discharge Complete    ?[place] or Home Care Consult Complete Complete   ?SW Recovery Care/Counseling Consult Complete Complete   ?Palliative Care Screening  Complete Not Applicable   ?Skilled Nursing Facility Complete Complete   ? ? ? ? ? ?

## 2021-06-07 NOTE — Procedures (Signed)
HD tx completed without complications. Net Uf removed 3000 mls. No abnormal findings present, baseline maintained; Hemostasis achieved, no concerns present. ?  ?

## 2021-06-07 NOTE — Care Management Important Message (Signed)
Important Message ? ?Patient Details  ?[name] ?MRN: [id number] ?Date of Birth: Nov 08, 1940 ? ? ?Medicare Important Message Given:  Yes ? ?Copy left in room ? ? ?[name] ?06/07/2021, 12:37 PM ?

## 2021-06-07 NOTE — Discharge Summary (Signed)
?Physician Discharge Summary ?  ?Patient: Jody [name] MRN: [id number] DOB: 1940-02-15  ?Admit date:     05/31/2021  ?Discharge date: 06/07/21  ?Discharge Physician: Shanon Brow Annalis Kaczmarczyk  ? ?PCP: Monico Blitz, MD  ? ?Recommendations at discharge:  ? Please follow up with primary care provider within 1-2 weeks ? Please repeat BMP and CBC in one week ? ? ? ?Hospital Course: ?81 y.o. female with medical history significant of stroke, ESRD, COPD on 2L O2, HFpEF, and PAD with recent left great toe osteomyelitis treated with amputation who presented to the [facility] due to unresponsiveness. She was picked up at her nursing facility to go to routine HD and became responsive only to sternal rub so was transferred to the ED instead. She was found to be hypoxic and hypercarbic requiring BiPAP. With BiPAP her mentation has improved. She is able to give a limited history, reports a recent history of coughing and feeling unwell generally but denies shortness of breath or chest pain. Denies recent wheezing.  ?  ?CXR demonstrated hypoventilation with streaky bibasilar opacities with enlarged cardiac silhouette and left lower lobe opacity consistent with pneumonia. CT head revealed atrophy and chronic infarcts without acute pathology. Blood cultures were drawn, antibiotics and IV fluids administered, and hospitalists consulted for admission. ? ?Patient improved clinically and was weaned off levophed and weaned back to her baseline 2L.  She was conversant and mentation returned to baseline.   ? ?On evening of 06/03/21, patient was found obtunded with agonal respirations with oxygen saturation in 60% range on her 2L.  Nursing reported pt had a pulse and appropriate BP (no sustained hypotension reported although there is a single BP 83/52 noted).  There was no bradycardia reported during the event though HR went to low 60s. ?Pt was bagged and apparently still "moving around" but there was concerned that with her mental status, she was unable to  protect airway>>intubated at [place].  Apparently with BVM and intubation, her vitals signs improved.  Initial ABG after intubation 7.45/32/239/22 (1.0). ? ?On 06/04/21, sedation was weaned off and SBT was attempted but pt failed---on PS 10/ CPAP 5 with 40%>>The patient's SpO2 dropped to 78%, RR to 4 and Vt 108. She was returned to  [facility] 16, Vt 490, 40% ,PEEP 5.  CT brain negative ? ?06/05/21--sedation weaned off and had successful SBT>>extubated.  Mental status remained stable after extubation.  She remained hemodynamically stable.   ? ?06/06/21--mental status back to baseline, improved.  Remained hemodynamically stable.  Discussed with neurology, Dr. Hortense Ramal, due concern for occult seizure for her episodes of unresponsiveness.  Plan for EEG today and HD tomorrow (as scheduled).  If EEG is negative and HD is uneventful tomorrow, pt can d/c back to SNF without any AEDs ? ?06/07/21--mental status remains at baseline.  Remains afebrile and hemodynamically stable.  EEG neg for seizure.  Tolerating HD.  Stable for d/c.  No further episodes of unresponsiveness.  Consider AEDs if has another episode ? ?Assessment and Plan: ?* Acute on chronic respiratory failure with hypoxia and hypercapnia (HCC) ?Due to LLL pneumonia and COPD exacerbation.  ?- initially on BiPAP, admit to SDU. HCPOA has confirmed pt's full code status,  ?-weaned back to 2L on 06/01/21 ?-on 2L [place] at baseline ?06/03/21 1945--intubated due to agonal respirations ?06/05/21-ABG 7.56/32/58/28 (0.35) ?Personally reviewed CXR--increased interstitial markings ?Ammonia = 26 ?4/23--extubated ?4/24--stable on 2L.  Order EEG.  Discussed with neurology ?4/25--stable on 2L.  EEG negative for seizure.  Tolerating HD ? ? ?Septic shock (  [place]) ?Due to pneumonia ?-present on admission ?-presented with tachypnea, tachycardia, hypotension ?-personally reviewed CXR--bibasilar opacity L>R ?-continue ceftriaxone and azithromycin ?-PCT--1.05 ?-06/02/21 AM--weaned off levophed ?-remains stable  off levophed 4/21 and 4/22 and 4/23 ?-sepsis physiology resolved ? ?Lobar pneumonia ([place]) ?Continue ceftriaxone and azithromycin D#5>>cefidinr with azithro ?--received 7 days abx ?-check PCT--1.05 (06/02/21) ?-PCT 0.82 on 4/22 ? ?COPD with acute exacerbation ([place]) ?Continue IV steroids>>po prednisone--received 7 days steroids all together ?-continue BDs ?-continue pulmicort ? ?Elevated troponin ?Due to demand ischemia since intubation ?No evidence of ACS ?Troponin 500>>519>>363 ?-06/03/21 Echo EF 50%, RV overload, severe TR ?-08/02/20 Echo EF 45-50% HK inferoseptal, inferolateral, Inf wall; mod TR ? ?Persistent atrial fibrillation ([place]) ?Continue apixaban ?Holding metoprolol due to hypotension initially>>restarted 4/21 ?Largely rate controlled ?TSH 1.132 ? ?Chronic combined systolic and diastolic CHF (congestive heart failure) ([place]) ?Was above her usual dry weight ?- Manage volume with HD ?-06/03/21 Echo EF 50%, RV overload, severe TR ?-08/02/20 Echo EF 45-50% HK inferoseptal, inferolateral, Inf wall; mod TR ?-personally reviewed CXR 4/21--increased interstitial markings ? ?ESRD on dialysis [facility]) ?- Nephrology consulted for HD ?- last HD 4/19>>3L removed UF ?-  HD 4/22 ?- last HD 4/25 ? ?Thrombocytopenia ([place]) ?Fairly chronic dating back to Feb 2023 ?-B12--1435 ?-folate-7.0 ?-TSH--1.132 ?Due to infection and acute medical illness ? ?Pericardial friction rub ?I did not hear on exam ?Discussed with cardiology, Dr. Ross>>no pericardial rub noted ?06/03/21 Echo-EF 50%, septal HK and flattening, no pericardial effusion, severe TR ?Personally reviewed EKG, unchanged RBBB ?ESR 2, CRP 0.6 ? ?PAD (peripheral artery disease) ([place]) ?- Continue statin and ASA ? ?Anemia of chronic kidney failure ?- baseline Hgb 8-9 ?-no signs of blood loss ? ?Acute osteomyelitis of left foot ([place]) ?S/p left first ray amputation 04/08/21--Dr. Jacqualyn Posey ?- Pain control meds include oxycodone and gabapentin which has recently been increased to 362m.  ?-  wound looks clean and intact without signs of infection ? ?History of CVA (cerebrovascular accident) ?CT head nonacute, no definite focal deficits on limited exam.  ?- Continue ASA, statin, apixaban ?Repeat CT brain 4/22--neg for acute findings ? ?Iron deficiency anemia ?- Continue iron supplement ? ?Essential hypertension ?Initially Holding metoprolol due to hypotension>>restarted 4/21 ? ? ? ? ?  ? ? ?Consultants: renal ?Procedures performed: none  ?Disposition: [facility] ?Diet recommendation:  ?Dysphagia type 2 thin  Liquid ?DISCHARGE MEDICATION: ?Allergies as of 06/07/2021   ? ?   Reactions  ? Codeine Nausea And Vomiting  ? ?  ? ?  ?Medication List  ?  ? ?STOP taking these medications   ? ?ceFEPIme 2 g in sodium chloride 0.9 % 100 mL ?  ?oxyCODONE 5 MG immediate release tablet ?Commonly known as: Roxicodone ?  ?pramipexole 0.125 MG tablet ?Commonly known as: MIRAPEX ?  ? ?  ? ?TAKE these medications   ? ?acetaminophen 500 MG tablet ?Commonly known as: TYLENOL ?Take 1 tablet (500 mg total) by mouth 3 (three) times daily. ?  ?albuterol 108 (90 Base) MCG/ACT inhaler ?Commonly known as: VENTOLIN HFA ?Inhale 1-2 puffs into the lungs every 6 (six) hours as needed for wheezing or shortness of breath. Reported on 03/01/2015 ?  ?albuterol 0.63 MG/3ML nebulizer solution ?Commonly known as: ACCUNEB ?Take 1 ampule by nebulization every 6 (six) hours as needed for wheezing. ?  ?apixaban 2.5 MG Tabs tablet ?Commonly known as: ELIQUIS ?Take 2.5 mg by mouth 2 (two) times daily. ?  ?aspirin 81 MG EC tablet ?Take 1 tablet (81 mg total) by mouth daily. Swallow  whole. ?  ?atorvastatin 40 MG tablet ?Commonly known as: LIPITOR ?Take 1 tablet (40 mg total) by mouth daily. ?  ?benzonatate 200 MG capsule ?Commonly known as: TESSALON ?Take 200 mg by mouth 3 (three) times daily as needed for cough. ?  ?cyanocobalamin 100 MCG tablet ?Take 1 tablet (100 mcg total) by mouth daily. ?  ?feeding supplement (NEPRO CARB STEADY) Liqd ?Take 237 mLs by  mouth 2 (two) times daily between meals. ?  ?ferrous sulfate 325 (65 FE) MG tablet ?Take 325 mg by mouth 2 (two) times daily with a meal. ?  ?folic acid 1 MG tablet ?Commonly known as: FOLVITE ?Take 1 tablet (1

## 2021-06-07 NOTE — Progress Notes (Signed)
Palliative: ? ?HPI: 81 y.o. female  with past medical history of stroke, ESRD on HD, COPD on 2L oxygen, HFpEF, PAD, recent left great toe osteomyelitis s/p amputation, anemia of chronic disease, diabetes admitted on 05/31/2021 due to being unresponsive and found to be hypoxic and hypercarbic requiring BiPAP with sepsis LLL pneumonia and COPD exacerbation.   ? ?I met today with Jody [name]. No family/visitors at bedside. Jody [name] is very sleepy but responds to me. She mumbles in response but difficult to understand. She is confused but able to follow simple commands but with much encouragement and coaxing. NT reports that she did not eat much this morning for breakfast and was confused and "yelling out" most of the night.  ? ?I called and spoke with niece, [name]. Jody [name] has previously reported to me that [name] is her POA. [name] reports that she has the paperwork and plans to fax to me but has not been able to yet (she has been working and caring for her own mother). I discussed with [name] about Jody [name]'s decline and intubation over the weekend. [name] shares that she has spoken with her aunt over the phone and has noticed that she is confused. I explained that the confusion seems to fluctuate but is much worse than last week. I explained that I am worried that all Jody [name]'s underlying health conditions and complications are taking a toll on her. I explained failure to thrive and worry that she may continue to worsen instead of improve. I explained to [name] that she should be prepared to have to make decisions on her aunts behalf in the possible near future. [name] expresses understanding but no change in goals at this time.  ? ?All questions/concerns addressed. Emotional support provided.  ? ?Exam: Confused - mumbling. Opens eyes and wiggles toes to command. Lethargic. No distress. Breathing regular, unlabored. Abd soft.  ? ?Plan: ?- Recommend ongoing goals of care discussions with outpatient  palliative care.  ? ?35 min ? ?Alicia Parker, NP ?Palliative Medicine Team ?Pager [phone] (Please see [email or web address] for schedule) ?Team Phone [phone]  ? ? ?Greater than 50%  of this time was spent counseling and coordinating care related to the above assessment and plan   ?

## 2021-06-08 DIAGNOSIS — N186 End stage renal disease: Secondary | ICD-10-CM | POA: Diagnosis not present

## 2021-06-08 DIAGNOSIS — J449 Chronic obstructive pulmonary disease, unspecified: Secondary | ICD-10-CM | POA: Diagnosis not present

## 2021-06-08 DIAGNOSIS — Z992 Dependence on renal dialysis: Secondary | ICD-10-CM | POA: Diagnosis not present

## 2021-06-08 DIAGNOSIS — E1122 Type 2 diabetes mellitus with diabetic chronic kidney disease: Secondary | ICD-10-CM | POA: Diagnosis not present

## 2021-06-09 DIAGNOSIS — Z992 Dependence on renal dialysis: Secondary | ICD-10-CM | POA: Diagnosis not present

## 2021-06-09 DIAGNOSIS — N186 End stage renal disease: Secondary | ICD-10-CM | POA: Diagnosis not present

## 2021-06-09 DIAGNOSIS — Z23 Encounter for immunization: Secondary | ICD-10-CM | POA: Diagnosis not present

## 2021-06-09 DIAGNOSIS — E559 Vitamin D deficiency, unspecified: Secondary | ICD-10-CM | POA: Diagnosis not present

## 2021-06-09 DIAGNOSIS — N2581 Secondary hyperparathyroidism of renal origin: Secondary | ICD-10-CM | POA: Diagnosis not present

## 2021-06-09 DIAGNOSIS — N250 Renal osteodystrophy: Secondary | ICD-10-CM | POA: Diagnosis not present

## 2021-06-10 ENCOUNTER — Encounter: Payer: Self-pay | Admitting: Podiatry

## 2021-06-10 ENCOUNTER — Ambulatory Visit (INDEPENDENT_AMBULATORY_CARE_PROVIDER_SITE_OTHER): Payer: Medicare Other

## 2021-06-10 ENCOUNTER — Ambulatory Visit (INDEPENDENT_AMBULATORY_CARE_PROVIDER_SITE_OTHER): Payer: Medicare Other | Admitting: Podiatry

## 2021-06-10 DIAGNOSIS — R0602 Shortness of breath: Secondary | ICD-10-CM | POA: Diagnosis not present

## 2021-06-10 DIAGNOSIS — Z9889 Other specified postprocedural states: Secondary | ICD-10-CM

## 2021-06-10 DIAGNOSIS — R5381 Other malaise: Secondary | ICD-10-CM | POA: Diagnosis not present

## 2021-06-10 DIAGNOSIS — I509 Heart failure, unspecified: Secondary | ICD-10-CM | POA: Diagnosis not present

## 2021-06-10 DIAGNOSIS — Z743 Need for continuous supervision: Secondary | ICD-10-CM | POA: Diagnosis not present

## 2021-06-10 DIAGNOSIS — R0689 Other abnormalities of breathing: Secondary | ICD-10-CM | POA: Diagnosis not present

## 2021-06-10 DIAGNOSIS — E785 Hyperlipidemia, unspecified: Secondary | ICD-10-CM | POA: Diagnosis not present

## 2021-06-10 DIAGNOSIS — Z992 Dependence on renal dialysis: Secondary | ICD-10-CM | POA: Diagnosis not present

## 2021-06-10 DIAGNOSIS — Z87891 Personal history of nicotine dependence: Secondary | ICD-10-CM | POA: Diagnosis not present

## 2021-06-10 DIAGNOSIS — N186 End stage renal disease: Secondary | ICD-10-CM | POA: Diagnosis not present

## 2021-06-10 DIAGNOSIS — E1122 Type 2 diabetes mellitus with diabetic chronic kidney disease: Secondary | ICD-10-CM | POA: Diagnosis not present

## 2021-06-10 DIAGNOSIS — I132 Hypertensive heart and chronic kidney disease with heart failure and with stage 5 chronic kidney disease, or end stage renal disease: Secondary | ICD-10-CM | POA: Diagnosis not present

## 2021-06-10 NOTE — Progress Notes (Signed)
Subjective:  ? ?Patient ID: Jody [name], female   DOB: 81 y.o.   MRN: [id number]  ? ?HPI ?Patient presents with caregiver after having had amputation of the left hallux and partial first metatarsal.  Patient is approximate 2 months after surgery and is doing well in wheelchair and is wearing surgical shoe ? ? ?ROS ? ? ?   ?Objective:  ?Physical Exam  ?Neurovascular status unchanged from previous visit negative Bevelyn Buckles' sign was noted.  The incision site is crusted over and there is no drainage noted there is no proximal edema erythema and it does not appear to be well coapted at the current time ? ?   ?Assessment:  ?Doing well post digital amputation partial ray amputation first left ? ?   ?Plan:  ?Continue local wound care continue open toed shoe gear usage and reappoint 4 weeks for final visit Dr. Earleen Newport.  Reviewed x-ray patient is having uneventful healing satisfactory resection of bone no indication of other pathology ?   ? ? ?

## 2021-06-11 DIAGNOSIS — Z23 Encounter for immunization: Secondary | ICD-10-CM | POA: Diagnosis not present

## 2021-06-11 DIAGNOSIS — Z992 Dependence on renal dialysis: Secondary | ICD-10-CM | POA: Diagnosis not present

## 2021-06-11 DIAGNOSIS — N250 Renal osteodystrophy: Secondary | ICD-10-CM | POA: Diagnosis not present

## 2021-06-11 DIAGNOSIS — N2581 Secondary hyperparathyroidism of renal origin: Secondary | ICD-10-CM | POA: Diagnosis not present

## 2021-06-11 DIAGNOSIS — E559 Vitamin D deficiency, unspecified: Secondary | ICD-10-CM | POA: Diagnosis not present

## 2021-06-11 DIAGNOSIS — N186 End stage renal disease: Secondary | ICD-10-CM | POA: Diagnosis not present

## 2021-06-13 MED FILL — Medication: Qty: 1 | Status: AC

## 2021-06-14 DIAGNOSIS — N2581 Secondary hyperparathyroidism of renal origin: Secondary | ICD-10-CM | POA: Diagnosis not present

## 2021-06-14 DIAGNOSIS — N186 End stage renal disease: Secondary | ICD-10-CM | POA: Diagnosis not present

## 2021-06-14 DIAGNOSIS — E559 Vitamin D deficiency, unspecified: Secondary | ICD-10-CM | POA: Diagnosis not present

## 2021-06-14 DIAGNOSIS — Z23 Encounter for immunization: Secondary | ICD-10-CM | POA: Diagnosis not present

## 2021-06-14 DIAGNOSIS — N250 Renal osteodystrophy: Secondary | ICD-10-CM | POA: Diagnosis not present

## 2021-06-14 DIAGNOSIS — Z992 Dependence on renal dialysis: Secondary | ICD-10-CM | POA: Diagnosis not present

## 2021-06-16 DIAGNOSIS — Z992 Dependence on renal dialysis: Secondary | ICD-10-CM | POA: Diagnosis not present

## 2021-06-16 DIAGNOSIS — N250 Renal osteodystrophy: Secondary | ICD-10-CM | POA: Diagnosis not present

## 2021-06-16 DIAGNOSIS — N2581 Secondary hyperparathyroidism of renal origin: Secondary | ICD-10-CM | POA: Diagnosis not present

## 2021-06-16 DIAGNOSIS — E559 Vitamin D deficiency, unspecified: Secondary | ICD-10-CM | POA: Diagnosis not present

## 2021-06-16 DIAGNOSIS — N186 End stage renal disease: Secondary | ICD-10-CM | POA: Diagnosis not present

## 2021-06-16 DIAGNOSIS — Z23 Encounter for immunization: Secondary | ICD-10-CM | POA: Diagnosis not present

## 2021-06-18 DIAGNOSIS — N250 Renal osteodystrophy: Secondary | ICD-10-CM | POA: Diagnosis not present

## 2021-06-18 DIAGNOSIS — Z23 Encounter for immunization: Secondary | ICD-10-CM | POA: Diagnosis not present

## 2021-06-18 DIAGNOSIS — E559 Vitamin D deficiency, unspecified: Secondary | ICD-10-CM | POA: Diagnosis not present

## 2021-06-18 DIAGNOSIS — N2581 Secondary hyperparathyroidism of renal origin: Secondary | ICD-10-CM | POA: Diagnosis not present

## 2021-06-18 DIAGNOSIS — N186 End stage renal disease: Secondary | ICD-10-CM | POA: Diagnosis not present

## 2021-06-18 DIAGNOSIS — Z992 Dependence on renal dialysis: Secondary | ICD-10-CM | POA: Diagnosis not present

## 2021-06-21 DIAGNOSIS — N186 End stage renal disease: Secondary | ICD-10-CM | POA: Diagnosis not present

## 2021-06-21 DIAGNOSIS — Z23 Encounter for immunization: Secondary | ICD-10-CM | POA: Diagnosis not present

## 2021-06-21 DIAGNOSIS — N2581 Secondary hyperparathyroidism of renal origin: Secondary | ICD-10-CM | POA: Diagnosis not present

## 2021-06-21 DIAGNOSIS — Z992 Dependence on renal dialysis: Secondary | ICD-10-CM | POA: Diagnosis not present

## 2021-06-21 DIAGNOSIS — N250 Renal osteodystrophy: Secondary | ICD-10-CM | POA: Diagnosis not present

## 2021-06-21 DIAGNOSIS — E559 Vitamin D deficiency, unspecified: Secondary | ICD-10-CM | POA: Diagnosis not present

## 2021-06-23 DIAGNOSIS — Z992 Dependence on renal dialysis: Secondary | ICD-10-CM | POA: Diagnosis not present

## 2021-06-23 DIAGNOSIS — Z23 Encounter for immunization: Secondary | ICD-10-CM | POA: Diagnosis not present

## 2021-06-23 DIAGNOSIS — N186 End stage renal disease: Secondary | ICD-10-CM | POA: Diagnosis not present

## 2021-06-23 DIAGNOSIS — E559 Vitamin D deficiency, unspecified: Secondary | ICD-10-CM | POA: Diagnosis not present

## 2021-06-23 DIAGNOSIS — N250 Renal osteodystrophy: Secondary | ICD-10-CM | POA: Diagnosis not present

## 2021-06-23 DIAGNOSIS — N2581 Secondary hyperparathyroidism of renal origin: Secondary | ICD-10-CM | POA: Diagnosis not present

## 2021-06-25 DIAGNOSIS — E559 Vitamin D deficiency, unspecified: Secondary | ICD-10-CM | POA: Diagnosis not present

## 2021-06-25 DIAGNOSIS — N186 End stage renal disease: Secondary | ICD-10-CM | POA: Diagnosis not present

## 2021-06-25 DIAGNOSIS — Z23 Encounter for immunization: Secondary | ICD-10-CM | POA: Diagnosis not present

## 2021-06-25 DIAGNOSIS — N250 Renal osteodystrophy: Secondary | ICD-10-CM | POA: Diagnosis not present

## 2021-06-25 DIAGNOSIS — N2581 Secondary hyperparathyroidism of renal origin: Secondary | ICD-10-CM | POA: Diagnosis not present

## 2021-06-25 DIAGNOSIS — Z992 Dependence on renal dialysis: Secondary | ICD-10-CM | POA: Diagnosis not present

## 2021-06-27 DIAGNOSIS — M6281 Muscle weakness (generalized): Secondary | ICD-10-CM | POA: Diagnosis not present

## 2021-06-27 DIAGNOSIS — J9601 Acute respiratory failure with hypoxia: Secondary | ICD-10-CM | POA: Diagnosis not present

## 2021-06-27 DIAGNOSIS — R6889 Other general symptoms and signs: Secondary | ICD-10-CM | POA: Diagnosis not present

## 2021-06-27 DIAGNOSIS — I5032 Chronic diastolic (congestive) heart failure: Secondary | ICD-10-CM | POA: Diagnosis not present

## 2021-06-28 DIAGNOSIS — E559 Vitamin D deficiency, unspecified: Secondary | ICD-10-CM | POA: Diagnosis not present

## 2021-06-28 DIAGNOSIS — N250 Renal osteodystrophy: Secondary | ICD-10-CM | POA: Diagnosis not present

## 2021-06-28 DIAGNOSIS — N186 End stage renal disease: Secondary | ICD-10-CM | POA: Diagnosis not present

## 2021-06-28 DIAGNOSIS — Z992 Dependence on renal dialysis: Secondary | ICD-10-CM | POA: Diagnosis not present

## 2021-06-28 DIAGNOSIS — N2581 Secondary hyperparathyroidism of renal origin: Secondary | ICD-10-CM | POA: Diagnosis not present

## 2021-06-28 DIAGNOSIS — Z23 Encounter for immunization: Secondary | ICD-10-CM | POA: Diagnosis not present

## 2021-06-30 DIAGNOSIS — Z992 Dependence on renal dialysis: Secondary | ICD-10-CM | POA: Diagnosis not present

## 2021-06-30 DIAGNOSIS — N250 Renal osteodystrophy: Secondary | ICD-10-CM | POA: Diagnosis not present

## 2021-06-30 DIAGNOSIS — N2581 Secondary hyperparathyroidism of renal origin: Secondary | ICD-10-CM | POA: Diagnosis not present

## 2021-06-30 DIAGNOSIS — E559 Vitamin D deficiency, unspecified: Secondary | ICD-10-CM | POA: Diagnosis not present

## 2021-06-30 DIAGNOSIS — Z23 Encounter for immunization: Secondary | ICD-10-CM | POA: Diagnosis not present

## 2021-06-30 DIAGNOSIS — N186 End stage renal disease: Secondary | ICD-10-CM | POA: Diagnosis not present

## 2021-07-02 ENCOUNTER — Emergency Department (HOSPITAL_COMMUNITY): Payer: Medicare Other

## 2021-07-02 ENCOUNTER — Encounter (HOSPITAL_COMMUNITY): Payer: Self-pay

## 2021-07-02 ENCOUNTER — Other Ambulatory Visit: Payer: Self-pay

## 2021-07-02 ENCOUNTER — Inpatient Hospital Stay (HOSPITAL_COMMUNITY)
Admission: EM | Admit: 2021-07-02 | Discharge: 2021-07-04 | DRG: 291 | Disposition: A | Discharge status: Other Acute Inpatient Hospital | Payer: Medicare Other | Attending: Internal Medicine | Admitting: Internal Medicine

## 2021-07-02 DIAGNOSIS — G8929 Other chronic pain: Secondary | ICD-10-CM | POA: Diagnosis present

## 2021-07-02 DIAGNOSIS — J90 Pleural effusion, not elsewhere classified: Secondary | ICD-10-CM | POA: Diagnosis not present

## 2021-07-02 DIAGNOSIS — J9811 Atelectasis: Secondary | ICD-10-CM | POA: Diagnosis not present

## 2021-07-02 DIAGNOSIS — Z96651 Presence of right artificial knee joint: Secondary | ICD-10-CM | POA: Diagnosis not present

## 2021-07-02 DIAGNOSIS — R0902 Hypoxemia: Secondary | ICD-10-CM | POA: Diagnosis not present

## 2021-07-02 DIAGNOSIS — I739 Peripheral vascular disease, unspecified: Secondary | ICD-10-CM | POA: Diagnosis not present

## 2021-07-02 DIAGNOSIS — I252 Old myocardial infarction: Secondary | ICD-10-CM

## 2021-07-02 DIAGNOSIS — I10 Essential (primary) hypertension: Secondary | ICD-10-CM | POA: Diagnosis not present

## 2021-07-02 DIAGNOSIS — I132 Hypertensive heart and chronic kidney disease with heart failure and with stage 5 chronic kidney disease, or end stage renal disease: Secondary | ICD-10-CM | POA: Diagnosis present

## 2021-07-02 DIAGNOSIS — I5042 Chronic combined systolic (congestive) and diastolic (congestive) heart failure: Secondary | ICD-10-CM | POA: Diagnosis not present

## 2021-07-02 DIAGNOSIS — J449 Chronic obstructive pulmonary disease, unspecified: Secondary | ICD-10-CM | POA: Diagnosis present

## 2021-07-02 DIAGNOSIS — N2581 Secondary hyperparathyroidism of renal origin: Secondary | ICD-10-CM | POA: Diagnosis present

## 2021-07-02 DIAGNOSIS — Z8249 Family history of ischemic heart disease and other diseases of the circulatory system: Secondary | ICD-10-CM

## 2021-07-02 DIAGNOSIS — Z8673 Personal history of transient ischemic attack (TIA), and cerebral infarction without residual deficits: Secondary | ICD-10-CM

## 2021-07-02 DIAGNOSIS — E7800 Pure hypercholesterolemia, unspecified: Secondary | ICD-10-CM | POA: Diagnosis present

## 2021-07-02 DIAGNOSIS — Z992 Dependence on renal dialysis: Secondary | ICD-10-CM

## 2021-07-02 DIAGNOSIS — R0602 Shortness of breath: Secondary | ICD-10-CM | POA: Diagnosis not present

## 2021-07-02 DIAGNOSIS — Z743 Need for continuous supervision: Secondary | ICD-10-CM | POA: Diagnosis not present

## 2021-07-02 DIAGNOSIS — R5381 Other malaise: Secondary | ICD-10-CM | POA: Diagnosis not present

## 2021-07-02 DIAGNOSIS — I4819 Other persistent atrial fibrillation: Secondary | ICD-10-CM | POA: Diagnosis present

## 2021-07-02 DIAGNOSIS — I5043 Acute on chronic combined systolic (congestive) and diastolic (congestive) heart failure: Secondary | ICD-10-CM | POA: Diagnosis present

## 2021-07-02 DIAGNOSIS — E1151 Type 2 diabetes mellitus with diabetic peripheral angiopathy without gangrene: Secondary | ICD-10-CM | POA: Diagnosis present

## 2021-07-02 DIAGNOSIS — N186 End stage renal disease: Secondary | ICD-10-CM | POA: Diagnosis not present

## 2021-07-02 DIAGNOSIS — I120 Hypertensive chronic kidney disease with stage 5 chronic kidney disease or end stage renal disease: Secondary | ICD-10-CM | POA: Diagnosis not present

## 2021-07-02 DIAGNOSIS — Z885 Allergy status to narcotic agent status: Secondary | ICD-10-CM

## 2021-07-02 DIAGNOSIS — R079 Chest pain, unspecified: Secondary | ICD-10-CM | POA: Diagnosis not present

## 2021-07-02 DIAGNOSIS — M898X9 Other specified disorders of bone, unspecified site: Secondary | ICD-10-CM | POA: Diagnosis present

## 2021-07-02 DIAGNOSIS — D631 Anemia in chronic kidney disease: Secondary | ICD-10-CM | POA: Diagnosis not present

## 2021-07-02 DIAGNOSIS — Z7901 Long term (current) use of anticoagulants: Secondary | ICD-10-CM

## 2021-07-02 DIAGNOSIS — R059 Cough, unspecified: Secondary | ICD-10-CM | POA: Diagnosis not present

## 2021-07-02 DIAGNOSIS — R0600 Dyspnea, unspecified: Secondary | ICD-10-CM | POA: Diagnosis not present

## 2021-07-02 DIAGNOSIS — I495 Sick sinus syndrome: Secondary | ICD-10-CM | POA: Diagnosis not present

## 2021-07-02 DIAGNOSIS — Z79899 Other long term (current) drug therapy: Secondary | ICD-10-CM

## 2021-07-02 DIAGNOSIS — Z87891 Personal history of nicotine dependence: Secondary | ICD-10-CM

## 2021-07-02 DIAGNOSIS — Z811 Family history of alcohol abuse and dependence: Secondary | ICD-10-CM

## 2021-07-02 DIAGNOSIS — Z89412 Acquired absence of left great toe: Secondary | ICD-10-CM | POA: Diagnosis not present

## 2021-07-02 DIAGNOSIS — E1121 Type 2 diabetes mellitus with diabetic nephropathy: Secondary | ICD-10-CM | POA: Diagnosis present

## 2021-07-02 DIAGNOSIS — Z20822 Contact with and (suspected) exposure to covid-19: Secondary | ICD-10-CM | POA: Diagnosis present

## 2021-07-02 DIAGNOSIS — Z803 Family history of malignant neoplasm of breast: Secondary | ICD-10-CM

## 2021-07-02 DIAGNOSIS — R69 Illness, unspecified: Secondary | ICD-10-CM | POA: Diagnosis not present

## 2021-07-02 DIAGNOSIS — E1122 Type 2 diabetes mellitus with diabetic chronic kidney disease: Secondary | ICD-10-CM | POA: Diagnosis not present

## 2021-07-02 DIAGNOSIS — Z90710 Acquired absence of both cervix and uterus: Secondary | ICD-10-CM

## 2021-07-02 DIAGNOSIS — I5033 Acute on chronic diastolic (congestive) heart failure: Secondary | ICD-10-CM | POA: Diagnosis present

## 2021-07-02 DIAGNOSIS — N250 Renal osteodystrophy: Secondary | ICD-10-CM | POA: Diagnosis not present

## 2021-07-02 DIAGNOSIS — E1129 Type 2 diabetes mellitus with other diabetic kidney complication: Secondary | ICD-10-CM | POA: Diagnosis not present

## 2021-07-02 DIAGNOSIS — J811 Chronic pulmonary edema: Secondary | ICD-10-CM | POA: Diagnosis not present

## 2021-07-02 LAB — TROPONIN I (HIGH SENSITIVITY)
Troponin I (High Sensitivity): 35 ng/L — ABNORMAL HIGH (ref <18)
Troponin I (High Sensitivity): 34 ng/L — ABNORMAL HIGH (ref <18)

## 2021-07-02 LAB — GLUCOSE, CAPILLARY: Glucose-Capillary: 133 mg/dL — ABNORMAL HIGH (ref 70–99)

## 2021-07-02 LAB — CBC
HCT: 34.4 % — ABNORMAL LOW (ref 36.0–46.0)
Hemoglobin: 10.1 g/dL — ABNORMAL LOW (ref 12.0–15.0)
MCH: 33.7 pg (ref 26.0–34.0)
MCHC: 29.4 g/dL — ABNORMAL LOW (ref 30.0–36.0)
MCV: 114.7 fL — ABNORMAL HIGH (ref 80.0–100.0)
Platelets: 95 10*3/uL — ABNORMAL LOW (ref 150–400)
RBC: 3 MIL/uL — ABNORMAL LOW (ref 3.87–5.11)
RDW: 17.4 % — ABNORMAL HIGH (ref 11.5–15.5)
WBC: 3.6 10*3/uL — ABNORMAL LOW (ref 4.0–10.5)
nRBC: 0 % (ref 0.0–0.2)

## 2021-07-02 LAB — HEPATITIS B SURFACE ANTIGEN: Hepatitis B Surface Ag: NONREACTIVE

## 2021-07-02 LAB — BASIC METABOLIC PANEL
Anion gap: 10 (ref 5–15)
BUN: 32 mg/dL — ABNORMAL HIGH (ref 8–23)
CO2: 29 mmol/L (ref 22–32)
Calcium: 9.7 mg/dL (ref 8.9–10.3)
Chloride: 100 mmol/L (ref 98–111)
Creatinine, Ser: 4.11 mg/dL — ABNORMAL HIGH (ref 0.44–1.00)
GFR, Estimated: 10 mL/min — ABNORMAL LOW (ref >60)
Glucose, Bld: 141 mg/dL — ABNORMAL HIGH (ref 70–99)
Potassium: 4.2 mmol/L (ref 3.5–5.1)
Sodium: 139 mmol/L (ref 135–145)

## 2021-07-02 LAB — RESP PANEL BY RT-PCR (FLU A&B, COVID) ARPGX2
Influenza A by PCR: NEGATIVE
Influenza B by PCR: NEGATIVE
SARS Coronavirus 2 by RT PCR: NEGATIVE

## 2021-07-02 MED ORDER — BENZONATATE 100 MG PO CAPS: 200.0000 mg | ORAL_CAPSULE | Freq: Three times a day (TID) | ORAL | Status: DC | PRN

## 2021-07-02 MED ORDER — CHLORHEXIDINE GLUCONATE CLOTH 2 % EX PADS
6.0000 | MEDICATED_PAD | Freq: Every day | CUTANEOUS | Status: DC
  Administered 2021-07-03: 6 via TOPICAL

## 2021-07-02 MED ORDER — ONDANSETRON HCL 4 MG PO TABS: 4.0000 mg | ORAL_TABLET | Freq: Four times a day (QID) | ORAL | Status: DC | PRN

## 2021-07-02 MED ORDER — NEPRO/CARBSTEADY PO LIQD
237.0000 mL | Freq: Two times a day (BID) | ORAL | Status: DC
  Administered 2021-07-03 – 2021-07-04 (×2): 237 mL via ORAL

## 2021-07-02 MED ORDER — METOPROLOL TARTRATE 25 MG PO TABS
12.5000 mg | ORAL_TABLET | Freq: Two times a day (BID) | ORAL | Status: DC
  Administered 2021-07-02: 12.5 mg via ORAL
  Filled 2021-07-02 (×4): qty 1

## 2021-07-02 MED ORDER — ALBUTEROL SULFATE HFA 108 (90 BASE) MCG/ACT IN AERS: 1.0000 | INHALATION_SPRAY | Freq: Four times a day (QID) | RESPIRATORY_TRACT | Status: DC | PRN

## 2021-07-02 MED ORDER — ONDANSETRON HCL 4 MG/2ML IJ SOLN: 4.0000 mg | Freq: Four times a day (QID) | INTRAMUSCULAR | Status: DC | PRN

## 2021-07-02 MED ORDER — SODIUM CHLORIDE 0.9 % IV SOLN: 100.0000 mL | INTRAVENOUS | Status: DC | PRN

## 2021-07-02 MED ORDER — VITAMIN B-12 100 MCG PO TABS
100.0000 ug | ORAL_TABLET | Freq: Every day | ORAL | Status: DC
  Administered 2021-07-03 – 2021-07-04 (×2): 100 ug via ORAL
  Filled 2021-07-02 (×2): qty 1

## 2021-07-02 MED ORDER — ALTEPLASE 2 MG IJ SOLR
2.0000 mg | Freq: Once | INTRAMUSCULAR | Status: DC | PRN
  Filled 2021-07-02: qty 2

## 2021-07-02 MED ORDER — PENTAFLUOROPROP-TETRAFLUOROETH EX AERO: 1.0000 "application " | INHALATION_SPRAY | CUTANEOUS | Status: DC | PRN

## 2021-07-02 MED ORDER — ASPIRIN 81 MG PO TBEC
81.0000 mg | DELAYED_RELEASE_TABLET | Freq: Every day | ORAL | Status: DC
  Administered 2021-07-02 – 2021-07-04 (×3): 81 mg via ORAL
  Filled 2021-07-02 (×3): qty 1

## 2021-07-02 MED ORDER — FOLIC ACID 1 MG PO TABS
1.0000 mg | ORAL_TABLET | Freq: Every day | ORAL | Status: DC
  Administered 2021-07-03 – 2021-07-04 (×2): 1 mg via ORAL
  Filled 2021-07-02 (×2): qty 1

## 2021-07-02 MED ORDER — FERROUS SULFATE 325 (65 FE) MG PO TABS
325.0000 mg | ORAL_TABLET | Freq: Two times a day (BID) | ORAL | Status: DC
Start: 2021-07-03 — End: 2021-07-04
  Administered 2021-07-03 – 2021-07-04 (×3): 325 mg via ORAL
  Filled 2021-07-02 (×3): qty 1

## 2021-07-02 MED ORDER — GABAPENTIN 300 MG PO CAPS
300.0000 mg | ORAL_CAPSULE | Freq: Every day | ORAL | Status: DC
  Administered 2021-07-03 – 2021-07-04 (×2): 300 mg via ORAL
  Filled 2021-07-02 (×2): qty 1

## 2021-07-02 MED ORDER — LIDOCAINE HCL (PF) 1 % IJ SOLN: 5.0000 mL | INTRAMUSCULAR | Status: DC | PRN

## 2021-07-02 MED ORDER — ATORVASTATIN CALCIUM 40 MG PO TABS
40.0000 mg | ORAL_TABLET | Freq: Every day | ORAL | Status: DC
  Administered 2021-07-02 – 2021-07-04 (×3): 40 mg via ORAL
  Filled 2021-07-02 (×3): qty 1

## 2021-07-02 MED ORDER — INSULIN ASPART 100 UNIT/ML IJ SOLN: 0.0000 [IU] | Freq: Every day | INTRAMUSCULAR | Status: DC

## 2021-07-02 MED ORDER — INSULIN ASPART 100 UNIT/ML IJ SOLN
0.0000 [IU] | Freq: Three times a day (TID) | INTRAMUSCULAR | Status: DC
  Administered 2021-07-03: 2 [IU] via SUBCUTANEOUS
  Administered 2021-07-03 (×2): 3 [IU] via SUBCUTANEOUS
  Administered 2021-07-04: 2 [IU] via SUBCUTANEOUS

## 2021-07-02 MED ORDER — APIXABAN 2.5 MG PO TABS
2.5000 mg | ORAL_TABLET | Freq: Two times a day (BID) | ORAL | Status: DC
  Administered 2021-07-02 – 2021-07-04 (×4): 2.5 mg via ORAL
  Filled 2021-07-02 (×4): qty 1

## 2021-07-02 MED ORDER — HEPARIN SODIUM (PORCINE) 1000 UNIT/ML DIALYSIS: 1000.0000 [IU] | INTRAMUSCULAR | Status: DC | PRN

## 2021-07-02 MED ORDER — LIDOCAINE-PRILOCAINE 2.5-2.5 % EX CREA: 1.0000 "application " | TOPICAL_CREAM | CUTANEOUS | Status: DC | PRN

## 2021-07-02 MED ORDER — SEVELAMER CARBONATE 800 MG PO TABS
800.0000 mg | ORAL_TABLET | Freq: Three times a day (TID) | ORAL | Status: DC
Start: 2021-07-03 — End: 2021-07-04
  Administered 2021-07-03 – 2021-07-04 (×5): 800 mg via ORAL
  Filled 2021-07-02 (×5): qty 1

## 2021-07-02 MED ORDER — ACETAMINOPHEN 325 MG PO TABS
650.0000 mg | ORAL_TABLET | Freq: Four times a day (QID) | ORAL | Status: DC | PRN
  Administered 2021-07-03 – 2021-07-04 (×2): 650 mg via ORAL
  Filled 2021-07-02 (×3): qty 2

## 2021-07-02 NOTE — Progress Notes (Signed)
Brief nephrology note: ER contacted me to arrange dialysis today as she missed OP treatment. Pt is TTS at [facility]. CXR with vascular congestion, c/o sob. Labs reviewed.  OP HD orders from recent nephrology note: Center: [facility]  on TTS . EDW 82.5Kg HD Bath 2K/2.5Ca  Time 3:15 Heparin none. Access RUE AVF BFR 400 DFR 500    Micera 200 mcg IV every 2 weeks  Plan: HD today, UF as tolerated, discussed with dialysis nurse. If pt is discharge after dialysis she will resume outpatient HD on Tuesday.  DCarolin Sicks, CKA.

## 2021-07-02 NOTE — Procedures (Signed)
    HEMODIALYSIS TREATMENT NOTE:   3 hour heparin-free session completed using right upper arm AVF (15g/antegrade). Goal NOT met.  Goal was lowered twice in response to declining BP (asymptomatic).  Net UF 2.4 liters.  All blood was returned and hemostasis was achieved in 15 minutes.  No changes from pre-HD assessment.  Rockwell [place], RN

## 2021-07-02 NOTE — Progress Notes (Signed)
Patient running A-fib on telemetry monitor with few pauses, longest reported being 2.4 sec. Patient does have hx of A-fib. Dr. Josephine Cables made aware.

## 2021-07-02 NOTE — ED Provider Notes (Signed)
[facility] EMERGENCY DEPARTMENT Provider Note   CSN: [id number] Arrival date & time: 07/02/21  1117     History  Chief Complaint  Patient presents with   Chest Pain   Shortness of Breath    Jody [name] is a 81 y.o. female.  HPI 81 year old female presents via EMS from dialysis center.  Patient is Tuesday Thursday dialysis and reports no missed sessions.  She presented to dialysis today and began having coughing and shortness of breath and was transported to the ED.  She did did not have any dialysis completed today per report.  Patient is complaining of dyspnea and coughing.     Home Medications Prior to Admission medications   Medication Sig Start Date End Date Taking? Authorizing Provider  acetaminophen (TYLENOL) 500 MG tablet Take 1 tablet (500 mg total) by mouth 3 (three) times daily. 04/13/21   Regalado, Belkys A, MD  albuterol (ACCUNEB) 0.63 MG/3ML nebulizer solution Take 1 ampule by nebulization every 6 (six) hours as needed for wheezing.    [provider]  albuterol (PROVENTIL HFA;VENTOLIN HFA) 108 (90 BASE) MCG/ACT inhaler Inhale 1-2 puffs into the lungs every 6 (six) hours as needed for wheezing or shortness of breath. Reported on 03/01/2015    [provider]  apixaban (ELIQUIS) 2.5 MG TABS tablet Take 2.5 mg by mouth 2 (two) times daily.    [provider]  aspirin EC 81 MG EC tablet Take 1 tablet (81 mg total) by mouth daily. Swallow whole. 04/14/21   Regalado, Belkys A, MD  atorvastatin (LIPITOR) 40 MG tablet Take 1 tablet (40 mg total) by mouth daily. 04/14/21   Regalado, Belkys A, MD  benzonatate (TESSALON) 200 MG capsule Take 200 mg by mouth 3 (three) times daily as needed for cough.    [provider]  ferrous sulfate 325 (65 FE) MG tablet Take 325 mg by mouth 2 (two) times daily with a meal.    [provider]  folic acid (FOLVITE) 1 MG tablet Take 1 tablet (1 mg total) by mouth daily. 06/08/21   Orson Eva, MD   gabapentin (NEURONTIN) 300 MG capsule Take 300 mg by mouth daily. 05/11/21   [provider]  metoprolol tartrate (LOPRESSOR) 25 MG tablet Take 0.5 tablets (12.5 mg total) by mouth 2 (two) times daily. 10/18/16   Kathie Dike, MD  multivitamin (RENA-VIT) TABS tablet Take 1 tablet by mouth at bedtime. 06/07/21   Orson Eva, MD  Nutritional Supplements (FEEDING SUPPLEMENT, NEPRO CARB STEADY,) LIQD Take 237 mLs by mouth 2 (two) times daily between meals. 06/07/21   Orson Eva, MD  sevelamer carbonate (RENVELA) 800 MG tablet Take 800 mg by mouth 3 (three) times daily with meals.  07/22/19   [provider]  vitamin B-12 100 MCG tablet Take 1 tablet (100 mcg total) by mouth daily. 04/14/21   Regalado, Cassie Freer, MD      Allergies    Codeine    Review of Systems   Review of Systems  Physical Exam Updated Vital Signs BP 129/75   Pulse 72   Temp (!) 96.5 F (35.8 C) (Rectal)   Resp 13   Ht 1.575 m ('5\' 2"'$ )   Wt 81.1 kg   SpO2 96%   BMI 32.70 kg/m  Physical Exam Vitals and nursing note reviewed.  Constitutional:      General: She is not in acute distress.    Appearance: She is obese. She is ill-appearing.  HENT:  Head: Normocephalic.  Cardiovascular:     Rate and Rhythm: Normal rate. Rhythm irregular.     Heart sounds: Normal heart sounds.  Pulmonary:     Effort: Tachypnea present.     Breath sounds: Examination of the right-lower field reveals decreased breath sounds and rhonchi. Examination of the left-lower field reveals decreased breath sounds and rhonchi. Decreased breath sounds and rhonchi present.  Musculoskeletal:     Cervical back: Normal range of motion and neck supple.     Comments: Left foot status post partial amputation appears to be well-healing  Neurological:     Mental Status: She is alert.    ED Results / Procedures / Treatments   Labs (all labs ordered are listed, but only abnormal results are displayed) Labs Reviewed  BASIC METABOLIC PANEL -  Abnormal; Notable for the following components:      Result Value   Glucose, Bld 141 (*)    BUN 32 (*)    Creatinine, Ser 4.11 (*)    GFR, Estimated 10 (*)    All other components within normal limits  CBC - Abnormal; Notable for the following components:   WBC 3.6 (*)    RBC 3.00 (*)    Hemoglobin 10.1 (*)    HCT 34.4 (*)    MCV 114.7 (*)    MCHC 29.4 (*)    RDW 17.4 (*)    Platelets 95 (*)    All other components within normal limits  TROPONIN I (HIGH SENSITIVITY) - Abnormal; Notable for the following components:   Troponin I (High Sensitivity) 35 (*)    All other components within normal limits  TROPONIN I (HIGH SENSITIVITY) - Abnormal; Notable for the following components:   Troponin I (High Sensitivity) 34 (*)    All other components within normal limits  RESP PANEL BY RT-PCR (FLU A&B, COVID) [id number]    EKG EKG Interpretation  Date/Time:  Saturday Jul 02 2021 11:43:57 EDT Ventricular Rate:  81 PR Interval:    QRS Duration: 126 QT Interval:  373 QTC Calculation: 433 R Axis:   0 Text Interpretation: Atrial fibrillation Right bundle branch block Abnormal T inferior leads No significant change since last tracing Confirmed by Pattricia Boss [id number]) on 07/02/2021 2:17:23 PM  Radiology DG Chest Port 1 View  Result Date: 07/02/2021 CLINICAL DATA:  Chest pain, dyspnea EXAM: PORTABLE CHEST 1 VIEW COMPARISON:  Previous studies including chest radiograph done on 06/10/2021 and CT chest done on 03/03/2021 FINDINGS: Transverse diameter of heart is increased. Central pulmonary vessels are slightly less prominent. There is poor inspiration. There is improvement in aeration of lower lung fields. No new focal infiltrates are seen. Costophrenic angles are clear. There is no pneumothorax. IMPRESSION: Cardiomegaly. There is interval decrease in pulmonary vascular congestion. There are no new focal infiltrates. Electronically Signed   By: Elmer Picker M.D.   On: 07/02/2021 12:28     Procedures Procedures    Medications Ordered in ED Medications - No data to display  ED Course/ Medical Decision Making/ A&P Clinical Course as of 07/02/21 1523  Sat Jul 02, 2021  1510 Troponins have remained stable  [DR]    Clinical Course User Index [DR] Pattricia Boss, MD                           Medical Decision Making 81 year old female presents today from dialysis with complaints of dyspnea and some chest discomfort.  Here her EKG is unchanged from  prior. Differential diagnosis includes but is not limited to volume overload, pneumonia, other diseases of the lungs including pneumothoraces, PE, acute coronary syndrome and heart failure.   patient with chest x-Ayleen Mckinstry with some cardiomegaly but no severe volume overload.  Potassium is normal. Discussed care with Dr. Darrick Grinder will arrange for dialysis. Repeat troponin is pending.  First troponin is elevated but patient has had ongoing chronically elevated troponins.  However, if significant delta troponin, patient will need to be observed.  Otherwise patient will be  Amount and/or Complexity of Data Reviewed Labs: ordered. Decision-making details documented in ED Course. Radiology: ordered and independent interpretation performed. Decision-making details documented in ED Course. ECG/medicine tests: ordered. Decision-making details documented in ED Course. Discussion of management or test interpretation with external provider(s): Discussed with Dr. Carolin Sicks on for nephrology Care discussed with Dr. Nehemiah Settle on-call for hospitalist and he will see the patient in consultation for admission           Final Clinical Impression(s) / ED Diagnoses Final diagnoses:  None    Rx / DC Orders ED Discharge Orders     None         Pattricia Boss, MD 07/02/21 1524

## 2021-07-02 NOTE — H&P (Signed)
History and Physical    Patient: Jody [name] EXB:[id number] DOB: 1940/02/18 DOA: 07/02/2021 DOS: the patient was seen and examined on 07/02/2021 PCP: Monico Blitz, MD  Patient coming from: Home  Chief Complaint:  Chief Complaint  Patient presents with   Chest Pain   Shortness of Breath   HPI: Jody [name] is a 81 y.o. female with medical history signifi of type II diabetes, end-stage renal disease on dialysis, history of stroke, history of MI with cardio vascular disease, peripheral artery disease, chronic arthritis and chronic pain.  Patient seen for chest pain that happened earlier today while she was going to dialysis.  She was having increased pain with cough.  Additionally, she has had increased swelling in her hands and feet.  No peeling or provoking factors.  No fevers, chills, nausea, vomiting.  Cough is dry and nonproductive.  She does report being treated for pneumonia recently.  Review of Systems: As mentioned in the history of present illness. All other systems reviewed and are negative. Past Medical History:  Diagnosis Date   Anemia in chronic kidney disease (CODE) 06/05/2015   Arthritis    "bad in my legs" (12/07/2014)   Chronic atrial fibrillation ([place]) 06/05/2015   Chronic back pain    "dr said my spine is crooked"   Chronic bronchitis ([place])    "get it q yr" (12/07/2014)   CKD stage 4 due to type 2 diabetes mellitus ([place]) 02/15/7251   Complication of anesthesia    headache for 2 days after surgery in May 2019   COPD (chronic obstructive pulmonary disease) ([place])    Gait difficulty 09/02/2014   Hypercholesterolemia    Hypertension    Neuropathy 2015   in legs   NSVT (nonsustained ventricular tachycardia) ([place])    Sinus brady-tachy syndrome ([place])    Stroke ([place]) 05/2014   denies residual on 12/07/2014   Type II diabetes mellitus [facility]    Past Surgical History:  Procedure Laterality Date   ABDOMINAL AORTOGRAM W/LOWER EXTREMITY Left 04/06/2021   Procedure:  ABDOMINAL AORTOGRAM W/LOWER EXTREMITY;  Surgeon: Broadus John, MD;  Location: [place] [facility];  Service: Cardiovascular;  Laterality: Left;   ABDOMINAL HYSTERECTOMY  ~ 1970   AMPUTATION TOE Left 04/08/2021   Procedure: FIRST TOE LEFT FOOT AMPUTATION;  Surgeon: Trula Slade, DPM;  Location: [place];  Service: Podiatry;  Laterality: Left;   APPENDECTOMY  ~ [place] Right 07/06/2017   Procedure: RIGHT RADIOCEPHALIC ARTERIOVENOUS FISTULA creation;  Surgeon: Conrad [place], MD;  Location: [place];  Service: Vascular;  Laterality: Right;   [place] Right 09/17/2017   Procedure: SECOND STAGE BASILIC VEIN TRANSPOSITION RIGHT ARM;  Surgeon: Rosetta Posner, MD;  Location: [place];  Service: Vascular;  Laterality: Right;   COLONOSCOPY N/A 10/28/2017   Procedure: COLONOSCOPY;  Surgeon: Rogene Houston, MD;  [facility];  Service: Endoscopy;  Laterality: N/A;   COLONOSCOPY WITH PROPOFOL N/A 12/29/2019   Procedure: COLONOSCOPY WITH PROPOFOL;  Surgeon: Eloise Harman, DO;  [facility];  Service: Endoscopy;  Laterality: N/A;   ESOPHAGOGASTRODUODENOSCOPY (EGD) WITH PROPOFOL N/A 10/31/2017   Procedure: ESOPHAGOGASTRODUODENOSCOPY (EGD) WITH PROPOFOL;  Surgeon: Rogene Houston, MD;  [facility];  Service: Endoscopy;  Laterality: N/A;   JOINT REPLACEMENT     PERIPHERAL VASCULAR BALLOON ANGIOPLASTY Left 04/06/2021   Procedure: PERIPHERAL VASCULAR BALLOON ANGIOPLASTY;  Surgeon: Broadus John, MD;  Location: [facility];  Service: Cardiovascular;  Laterality: Left;  left sfa succesful [place] AT Unsuccesful   TOTAL KNEE ARTHROPLASTY Right ~ [place]  ~ 1970   "9# in my stomach"   Social History:  reports that she quit smoking about 17 years ago. Her smoking use included cigarettes. She started smoking about 68 years ago. She has a 25.00 pack-year smoking history. She has never used smokeless tobacco. She reports that she  does not drink alcohol and does not use drugs.  Allergies  Allergen Reactions   Codeine Nausea And Vomiting    Family History  Problem Relation Age of Onset   Cancer Other    Heart attack Brother    Cancer Sister        breast   Alcohol abuse Brother     Prior to Admission medications   Medication Sig Start Date End Date Taking? Authorizing Provider  acetaminophen (TYLENOL) 500 MG tablet Take 1 tablet (500 mg total) by mouth 3 (three) times daily. 04/13/21   Regalado, Belkys A, MD  albuterol (ACCUNEB) 0.63 MG/3ML nebulizer solution Take 1 ampule by nebulization every 6 (six) hours as needed for wheezing.    [provider]  albuterol (PROVENTIL HFA;VENTOLIN HFA) 108 (90 BASE) MCG/ACT inhaler Inhale 1-2 puffs into the lungs every 6 (six) hours as needed for wheezing or shortness of breath. Reported on 03/01/2015    [provider]  apixaban (ELIQUIS) 2.5 MG TABS tablet Take 2.5 mg by mouth 2 (two) times daily.    [provider]  aspirin EC 81 MG EC tablet Take 1 tablet (81 mg total) by mouth daily. Swallow whole. 04/14/21   Regalado, Belkys A, MD  atorvastatin (LIPITOR) 40 MG tablet Take 1 tablet (40 mg total) by mouth daily. 04/14/21   Regalado, Belkys A, MD  benzonatate (TESSALON) 200 MG capsule Take 200 mg by mouth 3 (three) times daily as needed for cough.    [provider]  ferrous sulfate 325 (65 FE) MG tablet Take 325 mg by mouth 2 (two) times daily with a meal.    [provider]  folic acid (FOLVITE) 1 MG tablet Take 1 tablet (1 mg total) by mouth daily. 06/08/21   Orson Eva, MD  gabapentin (NEURONTIN) 300 MG capsule Take 300 mg by mouth daily. 05/11/21   [provider]  metoprolol tartrate (LOPRESSOR) 25 MG tablet Take 0.5 tablets (12.5 mg total) by mouth 2 (two) times daily. 10/18/16   Kathie Dike, MD  multivitamin (RENA-VIT) TABS tablet Take 1 tablet by mouth at bedtime. 06/07/21   Orson Eva, MD  Nutritional Supplements  (FEEDING SUPPLEMENT, NEPRO CARB STEADY,) LIQD Take 237 mLs by mouth 2 (two) times daily between meals. 06/07/21   Orson Eva, MD  sevelamer carbonate (RENVELA) 800 MG tablet Take 800 mg by mouth 3 (three) times daily with meals.  07/22/19   [provider]  vitamin B-12 100 MCG tablet Take 1 tablet (100 mcg total) by mouth daily. 04/14/21   Elmarie Shiley, MD    Physical Exam: Vitals:   07/02/21 1330 07/02/21 1430 07/02/21 1500 07/02/21 1530  BP: 129/75 116/85 115/65 106/70  Pulse: 72 82    Resp: '13 13 17 12  '$ Temp:      TempSrc:      SpO2: 96% 94%    Weight:      Height:       General: Elderly female. Awake and alert and oriented x3. No acute cardiopulmonary distress.  HEENT: Normocephalic atraumatic.  Right and left ears normal in appearance.  Pupils equal, round, reactive to light. Extraocular muscles are intact. Sclerae anicteric and noninjected.  Moist mucosal membranes. No mucosal lesions.  Neck: Neck supple without lymphadenopathy. No carotid bruits. No masses palpated.  Cardiovascular: Regular rate with normal S1-S2 sounds. No murmurs, rubs, gallops auscultated. No JVD.  2+ pitting edema in lower extremities bilaterally. Respiratory: Diminished breath sounds throughout, but no wheezes, rales.  No accessory muscle use. Abdomen: Soft, nontender, nondistended. Active bowel sounds. No masses or hepatosplenomegaly  Skin: No rashes, lesions, or ulcerations.  Dry, warm to touch. 2+ dorsalis pedis and radial pulses. Musculoskeletal: No calf or leg pain. All major joints not erythematous nontender.  No upper or lower joint deformation.  Good ROM.  No contractures  Psychiatric: Intact judgment and insight. Pleasant and cooperative. Neurologic: No focal neurological deficits. Strength is 5/5 and symmetric in upper and lower extremities.  Cranial nerves II through XII are grossly intact.  Data Reviewed: Results for orders placed or performed during the hospital encounter of 07/02/21  (from the past 24 hour(s))  Troponin I (High Sensitivity)     Status: Abnormal   Collection Time: 07/02/21 12:13 PM  Result Value Ref Range   Troponin I (High Sensitivity) 35 (H) <18 ng/L  Basic metabolic panel     Status: Abnormal   Collection Time: 07/02/21 12:13 PM  Result Value Ref Range   Sodium 139 135 - 145 mmol/L   Potassium 4.2 3.5 - 5.1 mmol/L   Chloride 100 98 - 111 mmol/L   CO2 29 22 - 32 mmol/L   Glucose, Bld 141 (H) 70 - 99 mg/dL   BUN 32 (H) 8 - 23 mg/dL   Creatinine, Ser 4.11 (H) 0.44 - 1.00 mg/dL   Calcium 9.7 8.9 - 10.3 mg/dL   GFR, Estimated 10 (L) >60 mL/min   Anion gap 10 5 - 15  CBC     Status: Abnormal   Collection Time: 07/02/21 12:13 PM  Result Value Ref Range   WBC 3.6 (L) 4.0 - 10.5 K/uL   RBC 3.00 (L) 3.87 - 5.11 MIL/uL   Hemoglobin 10.1 (L) 12.0 - 15.0 g/dL   HCT 34.4 (L) 36.0 - 46.0 %   MCV 114.7 (H) 80.0 - 100.0 fL   MCH 33.7 26.0 - 34.0 pg   MCHC 29.4 (L) 30.0 - 36.0 g/dL   RDW 17.4 (H) 11.5 - 15.5 %   Platelets 95 (L) 150 - 400 K/uL   nRBC 0.0 0.0 - 0.2 %  Troponin I (High Sensitivity)     Status: Abnormal   Collection Time: 07/02/21  1:37 PM  Result Value Ref Range   Troponin I (High Sensitivity) 34 (H) <18 ng/L   DG Chest Port 1 View  Result Date: 07/02/2021 CLINICAL DATA:  Chest pain, dyspnea EXAM: PORTABLE CHEST 1 VIEW COMPARISON:  Previous studies including chest radiograph done on 06/10/2021 and CT chest done on 03/03/2021 FINDINGS: Transverse diameter of heart is increased. Central pulmonary vessels are slightly less prominent. There is poor inspiration. There is improvement in aeration of lower lung fields. No new focal infiltrates are seen. Costophrenic angles are clear. There is no pneumothorax. IMPRESSION: Cardiomegaly. There is interval decrease in pulmonary vascular congestion. There are no new focal infiltrates. Electronically Signed   By: Elmer Picker M.D.   On: 07/02/2021 12:28     Assessment and Plan: No notes have  been filed under this hospital service. Service: Hospitalist  Patient discussed with EDP including relevant history, physical exam findings, laboratory data.  Principal Problem:   Chest pain Active Problems:   Persistent atrial fibrillation (HCC)   Chronic combined systolic and diastolic CHF (congestive heart failure) (HCC)   ESRD on dialysis [facility])   Essential hypertension   Type 2 diabetes mellitus with nephropathy (HCC)   History of CVA (cerebrovascular accident)   PAD (peripheral artery disease) (HCC)   Chest pain Troponins negative x2 with negative delta.  Likely secondary to fluid overloaded state.  We will diurese and see how the patient feels. End-stage renal disease on dialysis Dialysis per nephrology Type 2 diabetes It appears that the patient is not currently on any hypoglycemic's. Sliding scale insulin with CBGs Hypertension Continue home regimen Chronic combined systolic and diastolic heart failure Persistent A-fib Continue Eliquis    Advance Care Planning:   Code Status: Prior full code  Consults: Nephrology  Family Communication: None  Severity of Illness: The appropriate patient status for this patient is OBSERVATION. Observation status is judged to be reasonable and necessary in order to provide the required intensity of service to ensure the patient's safety. The patient's presenting symptoms, physical exam findings, and initial radiographic and laboratory data in the context of their medical condition is felt to place them at decreased risk for further clinical deterioration. Furthermore, it is anticipated that the patient will be medically stable for discharge from the hospital within 2 midnights of admission.   Author: Truett Mainland, DO 07/02/2021 4:17 PM  For on call review [email or web address].

## 2021-07-02 NOTE — ED Triage Notes (Signed)
Patient brought in by [facility] for chest pain and shortness of breath.  Patient states she has been coughing for a while and has been recently diagnosed with pneumonia. Patient was picked up from dialysis center.  States that she did not receive any dialysis treatment before leaving.

## 2021-07-03 ENCOUNTER — Observation Stay (HOSPITAL_COMMUNITY): Payer: Medicare Other

## 2021-07-03 DIAGNOSIS — N2581 Secondary hyperparathyroidism of renal origin: Secondary | ICD-10-CM | POA: Diagnosis present

## 2021-07-03 DIAGNOSIS — J90 Pleural effusion, not elsewhere classified: Secondary | ICD-10-CM | POA: Diagnosis not present

## 2021-07-03 DIAGNOSIS — J449 Chronic obstructive pulmonary disease, unspecified: Secondary | ICD-10-CM | POA: Diagnosis present

## 2021-07-03 DIAGNOSIS — I252 Old myocardial infarction: Secondary | ICD-10-CM | POA: Diagnosis not present

## 2021-07-03 DIAGNOSIS — R079 Chest pain, unspecified: Secondary | ICD-10-CM | POA: Diagnosis present

## 2021-07-03 DIAGNOSIS — R0602 Shortness of breath: Secondary | ICD-10-CM | POA: Diagnosis not present

## 2021-07-03 DIAGNOSIS — Z8673 Personal history of transient ischemic attack (TIA), and cerebral infarction without residual deficits: Secondary | ICD-10-CM | POA: Diagnosis not present

## 2021-07-03 DIAGNOSIS — Z89412 Acquired absence of left great toe: Secondary | ICD-10-CM | POA: Diagnosis not present

## 2021-07-03 DIAGNOSIS — Z885 Allergy status to narcotic agent status: Secondary | ICD-10-CM | POA: Diagnosis not present

## 2021-07-03 DIAGNOSIS — Z20822 Contact with and (suspected) exposure to covid-19: Secondary | ICD-10-CM | POA: Diagnosis present

## 2021-07-03 DIAGNOSIS — Z8249 Family history of ischemic heart disease and other diseases of the circulatory system: Secondary | ICD-10-CM | POA: Diagnosis not present

## 2021-07-03 DIAGNOSIS — Z96651 Presence of right artificial knee joint: Secondary | ICD-10-CM | POA: Diagnosis present

## 2021-07-03 DIAGNOSIS — I132 Hypertensive heart and chronic kidney disease with heart failure and with stage 5 chronic kidney disease, or end stage renal disease: Secondary | ICD-10-CM | POA: Diagnosis present

## 2021-07-03 DIAGNOSIS — E1122 Type 2 diabetes mellitus with diabetic chronic kidney disease: Secondary | ICD-10-CM | POA: Diagnosis present

## 2021-07-03 DIAGNOSIS — Z90710 Acquired absence of both cervix and uterus: Secondary | ICD-10-CM | POA: Diagnosis not present

## 2021-07-03 DIAGNOSIS — E7800 Pure hypercholesterolemia, unspecified: Secondary | ICD-10-CM | POA: Diagnosis present

## 2021-07-03 DIAGNOSIS — N186 End stage renal disease: Secondary | ICD-10-CM | POA: Diagnosis present

## 2021-07-03 DIAGNOSIS — G8929 Other chronic pain: Secondary | ICD-10-CM | POA: Diagnosis present

## 2021-07-03 DIAGNOSIS — I5042 Chronic combined systolic (congestive) and diastolic (congestive) heart failure: Secondary | ICD-10-CM | POA: Diagnosis present

## 2021-07-03 DIAGNOSIS — D631 Anemia in chronic kidney disease: Secondary | ICD-10-CM | POA: Diagnosis present

## 2021-07-03 DIAGNOSIS — I4819 Other persistent atrial fibrillation: Secondary | ICD-10-CM | POA: Diagnosis present

## 2021-07-03 DIAGNOSIS — Z87891 Personal history of nicotine dependence: Secondary | ICD-10-CM | POA: Diagnosis not present

## 2021-07-03 DIAGNOSIS — E1151 Type 2 diabetes mellitus with diabetic peripheral angiopathy without gangrene: Secondary | ICD-10-CM | POA: Diagnosis present

## 2021-07-03 DIAGNOSIS — R059 Cough, unspecified: Secondary | ICD-10-CM | POA: Diagnosis not present

## 2021-07-03 DIAGNOSIS — Z811 Family history of alcohol abuse and dependence: Secondary | ICD-10-CM | POA: Diagnosis not present

## 2021-07-03 DIAGNOSIS — Z992 Dependence on renal dialysis: Secondary | ICD-10-CM | POA: Diagnosis not present

## 2021-07-03 DIAGNOSIS — I10 Essential (primary) hypertension: Secondary | ICD-10-CM | POA: Diagnosis not present

## 2021-07-03 DIAGNOSIS — I495 Sick sinus syndrome: Secondary | ICD-10-CM | POA: Diagnosis present

## 2021-07-03 DIAGNOSIS — J9811 Atelectasis: Secondary | ICD-10-CM | POA: Diagnosis present

## 2021-07-03 LAB — CBC
HCT: 32.6 % — ABNORMAL LOW (ref 36.0–46.0)
Hemoglobin: 9.4 g/dL — ABNORMAL LOW (ref 12.0–15.0)
MCH: 33.2 pg (ref 26.0–34.0)
MCHC: 28.8 g/dL — ABNORMAL LOW (ref 30.0–36.0)
MCV: 115.2 fL — ABNORMAL HIGH (ref 80.0–100.0)
Platelets: 80 10*3/uL — ABNORMAL LOW (ref 150–400)
RBC: 2.83 MIL/uL — ABNORMAL LOW (ref 3.87–5.11)
RDW: 17.2 % — ABNORMAL HIGH (ref 11.5–15.5)
WBC: 3.7 10*3/uL — ABNORMAL LOW (ref 4.0–10.5)
nRBC: 0 % (ref 0.0–0.2)

## 2021-07-03 LAB — BASIC METABOLIC PANEL
Anion gap: 7 (ref 5–15)
BUN: 25 mg/dL — ABNORMAL HIGH (ref 8–23)
CO2: 32 mmol/L (ref 22–32)
Calcium: 9.1 mg/dL (ref 8.9–10.3)
Chloride: 100 mmol/L (ref 98–111)
Creatinine, Ser: 3.3 mg/dL — ABNORMAL HIGH (ref 0.44–1.00)
GFR, Estimated: 13 mL/min — ABNORMAL LOW (ref >60)
Glucose, Bld: 130 mg/dL — ABNORMAL HIGH (ref 70–99)
Potassium: 3.7 mmol/L (ref 3.5–5.1)
Sodium: 139 mmol/L (ref 135–145)

## 2021-07-03 LAB — GLUCOSE, CAPILLARY
Glucose-Capillary: 148 mg/dL — ABNORMAL HIGH (ref 70–99)
Glucose-Capillary: 153 mg/dL — ABNORMAL HIGH (ref 70–99)
Glucose-Capillary: 152 mg/dL — ABNORMAL HIGH (ref 70–99)
Glucose-Capillary: 159 mg/dL — ABNORMAL HIGH (ref 70–99)
Glucose-Capillary: 83 mg/dL (ref 70–99)

## 2021-07-03 LAB — HEPATITIS B SURFACE ANTIBODY, QUANTITATIVE: Hep B S AB Quant (Post): 710.9 m[IU]/mL (ref >9.9)

## 2021-07-03 MED ORDER — FUROSEMIDE 10 MG/ML IJ SOLN
INTRAMUSCULAR | Status: AC
  Filled 2021-07-03: qty 4

## 2021-07-03 MED ORDER — ALPRAZOLAM 0.25 MG PO TABS
0.2500 mg | ORAL_TABLET | Freq: Once | ORAL | Status: AC
Start: 2021-07-03 — End: 2021-07-03
  Administered 2021-07-03: 0.25 mg via ORAL
  Filled 2021-07-03: qty 1

## 2021-07-03 MED ORDER — FUROSEMIDE 10 MG/ML IJ SOLN
60.0000 mg | Freq: Once | INTRAMUSCULAR | Status: AC
  Administered 2021-07-03: 60 mg via INTRAVENOUS
  Filled 2021-07-03: qty 6

## 2021-07-03 NOTE — Progress Notes (Signed)
   07/03/21 2202  Vitals  BP (!) 95/52  MAP (mmHg) 67  BP Location Left Arm  BP Method Automatic  Patient Position (if appropriate) Lying  Pulse Rate 60  Pulse Rate Source Monitor  Level of Consciousness  Level of Consciousness Alert  MEWS COLOR  MEWS Score Color Yellow  Oxygen Therapy  SpO2 100 %  O2 Device Nasal Cannula  O2 Flow Rate (L/min) 2 L/min  Pain Assessment  Pain Scale 0-10  Pain Score 0  MEWS Score  MEWS Temp 0  MEWS Systolic 1  MEWS Pulse 0  MEWS RR 1  MEWS LOC 0  MEWS Score 2    Patient gets scheduled Metoprolol 12.5 mg. BP 95/52. MD Adefeso made aware of holding this medication.

## 2021-07-03 NOTE — Progress Notes (Signed)
Patient complaints of feeling SOB VVS. O2 93% on 2L MD made aware. Respiratory informed. Expiratory wheezing heard. No new orders placed. EMS called back to transport patient to [facility].

## 2021-07-03 NOTE — Progress Notes (Signed)
   07/03/21 1500  Vitals  BP (!) 121/54  MAP (mmHg) 73  BP Location Left Arm  BP Method Automatic  Patient Position (if appropriate) Lying  Resp (!) 30  MEWS COLOR  MEWS Score Color Yellow  Oxygen Therapy  SpO2 93 %  O2 Device Nasal Cannula  O2 Flow Rate (L/min) 2 L/min  Pain Assessment  Pain Scale 0-10  Pain Score 0  Complaints & Interventions  Complains of Shortness of breath  PCA/Epidural/Spinal Assessment  Respiratory Pattern Labored  MEWS Score  MEWS Temp 0  MEWS Systolic 0  MEWS Pulse 0  MEWS RR 2  MEWS LOC 0  MEWS Score 2  Provider Notification  Provider Name/Title Dr Louanne Belton  Date Provider Notified 07/03/21  Time Provider Notified 1500  Method of Notification Page (secure chat)  Notification Reason Change in status (SOB yellow mews respirations 30 per min)  Provider response See new orders  Date of Provider Response 07/03/21  Time of Provider Response 1500

## 2021-07-03 NOTE — Hospital Course (Addendum)
Jody [name] is a 81 y.o. female with medical history significant for type 2 diabetes, end-stage renal disease on hemodialysis, history of stroke, history of myocardial infarction, peripheral arterial disease, chronic arthritis and chronic pain presented to hospital with chest pain during hemodialysis with increased swelling in her hands and feet.  He did have mild dry cough.  Initial vitals were stable.    Chest x-ray showed pulmonary vascular congestion.  Patient was then admitted to the hospital for further evaluation and treatment.  Assessment and Plan:  Principal Problem:   Chest pain Active Problems:   Persistent atrial fibrillation (HCC)   Chronic combined systolic and diastolic CHF (congestive heart failure) (HCC)   ESRD on dialysis [facility])   Essential hypertension   Type 2 diabetes mellitus with nephropathy ([place])   History of CVA (cerebrovascular accident)   PAD (peripheral artery disease) ([place])     Chest pain Patient with mildly elevated troponin but  negative delta.  Denies current chest pain.  Thought to be likely secondary to fluid overload state.  Patient had recent echo 06/03/2021 which showed septal hypokinesis.  Appears to be stable at this time.  End-stage renal disease on dialysis Patient underwent hemodialysis on 07/02/2021.  Patient is on Tuesday Thursday Saturday schedule.  Continue renal diet with fluid restriction.  Appears to be compensated at this time.  Communicated with nephrology who recommends outpatient resumption of dialysis.  Type 2 diabetes mellitus. In the diabetes.  Essential hypertension Patient takes Cardizem at home.   Chronic combined systolic and diastolic heart failure Volume management with Lasix daily and hemodialysis.  Persistent A-fib Rate controlled.  Continue Eliquis.  On Cardizem as outpatient.

## 2021-07-03 NOTE — Significant Event (Signed)
After discharge orders were placed, patient had increased work of breathing, shortness of breath with tachypnea and some wheeze. CXR done without significant changes. Had HD yesterday. Denied chest pain. Lasix '60mg'$  IV given. Will cancel discharge today and reassess in am. Updated the patient's POA and niece Ms [name] on the phone.

## 2021-07-03 NOTE — Progress Notes (Signed)
IV lasix given. MD informed of yellow mews. [place] documentation completed.

## 2021-07-03 NOTE — Progress Notes (Signed)
Not feeling well.  Jody [name] was too hot.  Room very warm and covered in blanket.  Opened window, turned down heat and left only sheet on.  Vitals checked and all normal with O2 sat 95 on 2 liters .  Want to eat lunch.,

## 2021-07-03 NOTE — Progress Notes (Incomplete)
Patient POA called this nurse states she was unaware that patient was admitted. Social worker informed.

## 2021-07-03 NOTE — Discharge Summary (Signed)
Physician Discharge Summary   Patient: Jody [name] MRN: [id number] DOB: [birth year]  Admit date:     07/02/2021  Discharge date: 07/03/21  Discharge Physician: Flora Lipps   PCP: Monico Blitz, MD   Recommendations at discharge:   Follow-up with your primary care physician in 1 to 2 weeks.  Continue hemodialysis as outpatient.   Follow-up with nephrology and cardiology as has been scheduled by the clinic   Discharge Diagnoses: Principal Problem:   Chest pain Active Problems:   Persistent atrial fibrillation (HCC)   Chronic combined systolic and diastolic CHF (congestive heart failure) ([place])   ESRD on dialysis [facility])   Essential hypertension   Type 2 diabetes mellitus with nephropathy ([place])   History of CVA (cerebrovascular accident)   PAD (peripheral artery disease) ([place])  Resolved Problems:   * No resolved hospital problems. [place] [facility] Course: Jody [name] is a 81 y.o. female with medical history significant for type 2 diabetes, end-stage renal disease on hemodialysis, history of stroke, history of myocardial infarction, peripheral arterial disease, chronic arthritis and chronic pain presented to hospital with chest pain during hemodialysis with increased swelling in her hands and feet.  He did have mild dry cough.  Initial vitals were stable.    Chest x-ray showed pulmonary vascular congestion.  Patient was then admitted to the hospital for further evaluation and treatment.  Assessment and Plan:  Principal Problem:   Chest pain Active Problems:   Persistent atrial fibrillation (HCC)   Chronic combined systolic and diastolic CHF (congestive heart failure) (HCC)   ESRD on dialysis [facility])   Essential hypertension   Type 2 diabetes mellitus with nephropathy ([place])   History of CVA (cerebrovascular accident)   PAD (peripheral artery disease) ([place])     Chest pain Patient with mildly elevated troponin but  negative delta.  Denies current chest pain.  Thought to be likely  secondary to fluid overload state.  Patient had recent echo 06/03/2021 which showed septal hypokinesis.  Appears to be stable at this time.  End-stage renal disease on dialysis Patient underwent hemodialysis on 07/02/2021.  Patient is on Tuesday Thursday Saturday schedule.  Continue renal diet with fluid restriction.  Appears to be compensated at this time.  Communicated with nephrology who recommends outpatient resumption of dialysis.  Type 2 diabetes mellitus. In the diabetes.  Essential hypertension Patient takes Cardizem at home.   Chronic combined systolic and diastolic heart failure Volume management with Lasix daily and hemodialysis.  Persistent A-fib Rate controlled.  Continue Eliquis.  On Cardizem as outpatient.      Consultants: Nephrology Procedures performed: Hemodialysis Disposition:  Long term care Diet recommendation:  Discharge Diet Orders (From admission, onward)     Start     Ordered   07/03/21 0000  Diet - low sodium heart healthy        07/03/21 0956           Cardiac diet DISCHARGE MEDICATION: Allergies as of 07/03/2021       Reactions   Codeine Nausea And Vomiting        Medication List     TAKE these medications    acetaminophen 500 MG tablet Commonly known as: TYLENOL Take by mouth.   acetaminophen 500 MG tablet Commonly known as: TYLENOL Take 1 tablet (500 mg total) by mouth 3 (three) times daily.   albuterol 108 (90 Base) MCG/ACT inhaler Commonly known as: VENTOLIN HFA Inhale 1-2 puffs into the lungs every 6 (six) hours as needed for wheezing  or shortness of breath. Reported on 03/01/2015   albuterol 0.63 MG/3ML nebulizer solution Commonly known as: ACCUNEB Take 1 ampule by nebulization every 6 (six) hours as needed for wheezing.   apixaban 2.5 MG Tabs tablet Commonly known as: ELIQUIS Take 2.5 mg by mouth 2 (two) times daily.   aspirin EC 81 MG tablet Take 1 tablet by mouth daily.   aspirin EC 81 MG tablet Take 1 tablet  (81 mg total) by mouth daily. Swallow whole.   atorvastatin 40 MG tablet Commonly known as: LIPITOR Take 1 tablet by mouth at bedtime.   atorvastatin 40 MG tablet Commonly known as: LIPITOR Take 1 tablet (40 mg total) by mouth daily.   benzonatate 200 MG capsule Commonly known as: TESSALON Take 200 mg by mouth 3 (three) times daily as needed for cough.   cyanocobalamin 1000 MCG tablet Take 1 tablet by mouth daily.   cyanocobalamin 100 MCG tablet Take 1 tablet (100 mcg total) by mouth daily.   diltiazem 120 MG 24 hr capsule Commonly known as: CARDIZEM CD Take 120 mg by mouth daily.   feeding supplement (NEPRO CARB STEADY) Liqd Take 237 mLs by mouth 2 (two) times daily between meals.   ferrous sulfate 325 (65 FE) MG tablet Take 325 mg by mouth 2 (two) times daily with a meal.   folic acid 1 MG tablet Commonly known as: FOLVITE Take 1 tablet (1 mg total) by mouth daily.   furosemide 40 MG tablet Commonly known as: LASIX Take 40 mg by mouth daily.   gabapentin 300 MG capsule Commonly known as: NEURONTIN Take 300 mg by mouth daily.   metoprolol tartrate 25 MG tablet Commonly known as: LOPRESSOR Take 0.5 tablets (12.5 mg total) by mouth 2 (two) times daily.   multivitamin Tabs tablet Take 1 tablet by mouth at bedtime.   Oncovite Tabs Take 1 tablet by mouth at bedtime.   sevelamer carbonate 800 MG tablet Commonly known as: RENVELA Take 800 mg by mouth 3 (three) times daily with meals.        Follow-up Information     Monico Blitz, MD Follow up.   Specialty: Internal Medicine Contact information: [address]  [place] [address] [phone]         Minus Breeding, MD .   Specialty: Cardiology Contact information: [address] [place] [address] [phone]                Subjective Today, patient was seen and examined at bedside.  Denies any chest pain.  Has baseline shortness of breath.  Discharge Exam: Filed Weights    07/02/21 1137 07/02/21 1630  Weight: 81.1 kg 81.1 kg      07/03/2021    8:04 AM 07/03/2021    6:00 AM 07/03/2021    2:20 AM  Vitals with BMI  Systolic 563 96 875  Diastolic 84 62 60  Pulse  63 81    General:  Average built, not in obvious distress, elderly , on nasal canula oxygen HENT:   No scleral pallor or icterus noted. Oral mucosa is moist.  Chest:    Diminished breath sounds bilaterally.  CVS: S1 &S2 heard. No murmur.  Regular rate and rhythm. Abdomen: Soft, nontender, nondistended.  Bowel sounds are heard.   Extremities: No cyanosis, clubbing or edema.  Peripheral pulses are palpable. Psych: Alert, awake and Communicative normal mood CNS:  No cranial nerve deficits.  Power equal in all extremities.   Skin: Warm and dry.  No rashes noted.   Condition at discharge: good  The results of significant diagnostics from this hospitalization (including imaging, microbiology, ancillary and laboratory) are listed below for reference.   Imaging Studies: CT HEAD WO CONTRAST (5MM)  Result Date: 06/04/2021 CLINICAL DATA:  Provided history: Mental status change, unknown cause. Acute mental status change with agonal respirations. EXAM: CT HEAD WITHOUT CONTRAST TECHNIQUE: Contiguous axial images were obtained from the base of the skull through the vertex without intravenous contrast. RADIATION DOSE REDUCTION: This exam was performed according to the departmental dose-optimization program which includes automated exposure control, adjustment of the mA and/or kV according to patient size and/or use of iterative reconstruction technique. COMPARISON:  Prior head CT examinations 05/31/2021 and earlier. FINDINGS: Brain: Mild generalized cerebral atrophy. Redemonstrated chronic cortical/subcortical infarcts within the right parietal and occipital lobes. Background moderate to advanced patchy and ill-defined hypoattenuation within the cerebral white matter, nonspecific but compatible with chronic small  vessel ischemic disease. There is no acute intracranial hemorrhage. No acute demarcated cortical infarct. No extra-axial fluid collection. No evidence of an intracranial mass. No midline shift. Vascular: No hyperdense vessel.  Atherosclerotic calcifications. Skull: Normal. Negative for fracture or focal lesion. Sinuses/Orbits: Visualized orbits show no acute finding. Small mucous retention cyst within the left sphenoid sinus. Other: Small-volume fluid within the left mastoid air cells. Partially visualized life-support tubes. IMPRESSION: No evidence of acute intracranial abnormality. Redemonstrated chronic cortical/subcortical infarcts within the right parietal and occipital lobes. Background moderate to advanced chronic small vessel ischemic changes within the cerebral white matter. Mild generalized cerebral atrophy. Electronically Signed   By: Kellie Simmering D.O.   On: 06/04/2021 13:16   DG Chest Port 1 View  Result Date: 07/02/2021 CLINICAL DATA:  Chest pain, dyspnea EXAM: PORTABLE CHEST 1 VIEW COMPARISON:  Previous studies including chest radiograph done on 06/10/2021 and CT chest done on 03/03/2021 FINDINGS: Transverse diameter of heart is increased. Central pulmonary vessels are slightly less prominent. There is poor inspiration. There is improvement in aeration of lower lung fields. No new focal infiltrates are seen. Costophrenic angles are clear. There is no pneumothorax. IMPRESSION: Cardiomegaly. There is interval decrease in pulmonary vascular congestion. There are no new focal infiltrates. Electronically Signed   By: Elmer Picker M.D.   On: 07/02/2021 12:28   DG CHEST PORT 1 VIEW  Result Date: 06/04/2021 CLINICAL DATA:  Check NGT placement. EXAM: PORTABLE CHEST 1 VIEW COMPARISON:  Portable chest yesterday at 7:56 p.m. FINDINGS: 4:50 a.m., 06/04/2021. ETT has been advanced to within 1 cm of the carina could be withdrawn 2.5 cm for mid tracheal placement to avoid right mainstem bronchus  intubation. NGT enters the stomach and its tip is in the gastric antrum in good position. Partially lucent electrical pad overlies the medial right chest. Cardiomegaly. There is perihilar vascular congestion with mild-to-moderate interstitial edema. Moderate right and small left pleural effusions continue to be seen with opacities of the right-greater-than-left lower lung fields which could be atelectasis or consolidation. The mediastinum is stable. There is aortic atherosclerosis. Mild osteopenia. Thoracic spondylosis. The interstitial edema is slightly worsened on the current exam. In all other respects no further changes. IMPRESSION: 1. ETT tip is within 1 cm of the carina. Recommend withdrawing the tube 2.5 cm to a mid tracheal position. 2. NGT is well placed with tip in the antrum. 3. Perihilar vascular congestion and mild-to-moderate interstitial edema with slightly worsened interstitial edema. 4. Moderate right and small left pleural effusions and overlying lung opacities. Electronically Signed  By: Telford Nab M.D.   On: 06/04/2021 06:54   DG CHEST PORT 1 VIEW  Result Date: 06/03/2021 CLINICAL DATA:  Agonal breathing, code blue EXAM: PORTABLE CHEST 1 VIEW COMPARISON:  Previous studies including the examination of 05/31/2021 FINDINGS: Transverse diameter of heart is increased. There is interval placement of endotracheal tube with its tip 2 cm above the carina. Enteric tube is noted traversing the esophagus. Central pulmonary vessels are prominent. There is poor inspiration. There is crowding of markings in the lower lung fields. Lateral CP angles are indistinct. There is no pneumothorax. IMPRESSION: Cardiomegaly. Central pulmonary vessels are prominent suggesting CHF. Crowding of markings in the lower lung fields may be due to poor inspiration. Lateral CP angles are indistinct suggesting possible small effusions. Tip of endotracheal tube is lower than usual 2 cm above the carina. Electronically Signed    By: Elmer Picker M.D.   On: 06/03/2021 20:05   DG Foot 2 Views Left  Result Date: 06/16/2021 Please see detailed radiograph report in office note.  DG Foot Complete Left  Result Date: 06/14/2021 Please see detailed radiograph report in office note.  EEG adult  Result Date: 06/06/2021 Lora Havens, MD     06/06/2021  8:54 PM Patient [name] MRN: [id number] Epilepsy Attending: Lora Havens Referring Physician/Provider: Orson Eva, MD Date: 06/06/2021 Duration: 22.30 mins Patient history: 81yo f with transient alteration of awareness. EEG to evaluate for seizure. Level of alertness: Awake, asleep AEDs during EEG study: None Technical aspects: This EEG study was done with scalp electrodes positioned according to the 10-20 International system of electrode placement. Electrical activity was acquired at a sampling rate of '500Hz'$  and reviewed with a high frequency filter of '70Hz'$  and a low frequency filter of '1Hz'$ . EEG data were recorded continuously and digitally stored. Description: The posterior dominant rhythm consists of 8-9 Hz activity of moderate voltage (25-35 uV) seen predominantly in posterior head regions, symmetric and reactive to eye opening and eye closing. Sleep was characterized by vertex waves, sleep spindles (12 to 14 Hz), maximal frontocentral region. EEG showed intermittent generalized 3 to 6 Hz theta-delta slowing. Hyperventilation and photic stimulation were not performed.   ABNORMALITY - Intermittent slow, generalized IMPRESSION: This study is suggestive of mild diffuse encephalopathy, nonspecific etiology. No seizures or epileptiform discharges were seen throughout the recording. Lora Havens    Microbiology: Results for orders placed or performed during the hospital encounter of 07/02/21  Resp Panel by RT-PCR (Flu A&B, Covid) Nasopharyngeal Swab     Status: None   Collection Time: 07/02/21  4:15 PM   Specimen: Nasopharyngeal Swab; Nasopharyngeal(NP)  swabs in vial transport medium  Result Value Ref Range Status   SARS Coronavirus 2 by RT PCR NEGATIVE NEGATIVE Final    Comment: (NOTE) SARS-CoV-2 target nucleic acids are NOT DETECTED.  The SARS-CoV-2 RNA is generally detectable in upper respiratory specimens during the acute phase of infection. The lowest concentration of SARS-CoV-2 viral copies this assay can detect is 138 copies/mL. A negative result does not preclude SARS-Cov-2 infection and should not be used as the sole basis for treatment or other patient management decisions. A negative result may occur with  improper specimen collection/handling, submission of specimen other than nasopharyngeal swab, presence of viral mutation(s) within the areas targeted by this assay, and inadequate number of viral copies(<138 copies/mL). A negative result must be combined with clinical observations, patient history, and epidemiological information. The expected result is Negative.  Fact Sheet for  Patients:  EntrepreneurPulse.com.au  Fact Sheet for Healthcare Providers:  IncredibleEmployment.be  This test is no t yet approved or cleared by the [place] FDA and  has been authorized for detection and/or diagnosis of SARS-CoV-2 by FDA under an Emergency Use Authorization (EUA). This EUA will remain  in effect (meaning this test can be used) for the duration of the COVID-19 declaration under Section 564(b)(1) of the Act, 21 U.S.C.section 360bbb-3(b)(1), unless the authorization is terminated  or revoked sooner.       Influenza A by PCR NEGATIVE NEGATIVE Final   Influenza B by PCR NEGATIVE NEGATIVE Final    Comment: (NOTE) The Xpert Xpress SARS-CoV-2/FLU/RSV plus assay is intended as an aid in the diagnosis of influenza from Nasopharyngeal swab specimens and should not be used as a sole basis for treatment. Nasal washings and aspirates are unacceptable for Xpert Xpress  SARS-CoV-2/FLU/RSV testing.  Fact Sheet for Patients: EntrepreneurPulse.com.au  Fact Sheet for Healthcare Providers: IncredibleEmployment.be  This test is not yet approved or cleared by the [place] FDA and has been authorized for detection and/or diagnosis of SARS-CoV-2 by FDA under an Emergency Use Authorization (EUA). This EUA will remain in effect (meaning this test can be used) for the duration of the COVID-19 declaration under Section 564(b)(1) of the Act, 21 U.S.C. section 360bbb-3(b)(1), unless the authorization is terminated or revoked.  Performed at [facility], [address] [place], [place] [address]     Labs: CBC: Recent Labs  Lab 07/02/21 1213 07/03/21 0526  WBC 3.6* 3.7*  HGB 10.1* 9.4*  HCT 34.4* 32.6*  MCV 114.7* 115.2*  PLT 95* 80*   Basic Metabolic Panel: Recent Labs  Lab 07/02/21 1213 07/03/21 0526  NA 139 139  K 4.2 3.7  CL 100 100  CO2 29 32  GLUCOSE 141* 130*  BUN 32* 25*  CREATININE 4.11* 3.30*  CALCIUM 9.7 9.1   Liver Function Tests: No results for input(s): AST, ALT, ALKPHOS, BILITOT, PROT, ALBUMIN in the last 168 hours. CBG: Recent Labs  Lab 07/02/21 2135 07/03/21 0720  GLUCAP 133* 148*    Discharge time spent: greater than 30 minutes.  Signed: Flora Lipps, MD [facility] 07/03/2021

## 2021-07-03 NOTE — Progress Notes (Signed)
Called report to [facility] facility RN. Social worker informed. Medical necessity form printed along with AVS. Patient informed. Called to inform family with no answer. No IV present. Patient is ready for transport once EMS has been called.

## 2021-07-03 NOTE — Progress Notes (Signed)
Patient is a long term care resident at [facility] and is ready to return to the facility.  CSW spoke with Melaine of [facility] who states contact needs to be made with [facility] unit staff to facilitate discharge. CSW spoke with Ronnie Derby @ [facility] who states the patient can return.  Please call report to [phone]. LPN and MD aware of discharge plan.  Madilyn Fireman, MSW, LCSW Transitions of Care  Clinical Social Worker II [phone]

## 2021-07-04 DIAGNOSIS — Z8673 Personal history of transient ischemic attack (TIA), and cerebral infarction without residual deficits: Secondary | ICD-10-CM

## 2021-07-04 DIAGNOSIS — J90 Pleural effusion, not elsewhere classified: Secondary | ICD-10-CM

## 2021-07-04 DIAGNOSIS — J9811 Atelectasis: Secondary | ICD-10-CM | POA: Diagnosis not present

## 2021-07-04 DIAGNOSIS — I5042 Chronic combined systolic (congestive) and diastolic (congestive) heart failure: Secondary | ICD-10-CM

## 2021-07-04 DIAGNOSIS — R059 Cough, unspecified: Secondary | ICD-10-CM | POA: Diagnosis not present

## 2021-07-04 DIAGNOSIS — Z992 Dependence on renal dialysis: Secondary | ICD-10-CM

## 2021-07-04 DIAGNOSIS — I10 Essential (primary) hypertension: Secondary | ICD-10-CM

## 2021-07-04 DIAGNOSIS — R079 Chest pain, unspecified: Secondary | ICD-10-CM | POA: Diagnosis not present

## 2021-07-04 DIAGNOSIS — I4819 Other persistent atrial fibrillation: Secondary | ICD-10-CM

## 2021-07-04 DIAGNOSIS — I739 Peripheral vascular disease, unspecified: Secondary | ICD-10-CM

## 2021-07-04 DIAGNOSIS — N186 End stage renal disease: Secondary | ICD-10-CM | POA: Diagnosis not present

## 2021-07-04 DIAGNOSIS — E1121 Type 2 diabetes mellitus with diabetic nephropathy: Secondary | ICD-10-CM

## 2021-07-04 LAB — HEPATITIS B SURFACE ANTIBODY,QUALITATIVE: Hep B S Ab: REACTIVE — AB

## 2021-07-04 LAB — GLUCOSE, CAPILLARY
Glucose-Capillary: 83 mg/dL (ref 70–99)
Glucose-Capillary: 122 mg/dL — ABNORMAL HIGH (ref 70–99)

## 2021-07-04 MED ORDER — GUAIFENESIN-DM 100-10 MG/5ML PO SYRP
5.0000 mL | ORAL_SOLUTION | ORAL | Status: DC | PRN
  Administered 2021-07-04: 5 mL via ORAL
  Filled 2021-07-04: qty 5

## 2021-07-04 MED ORDER — GUAIFENESIN-DM 100-10 MG/5ML PO SYRP
5.0000 mL | ORAL_SOLUTION | ORAL | Status: AC | PRN
Start: 2021-07-04 — End: 2021-07-09

## 2021-07-04 MED ORDER — FUROSEMIDE 10 MG/ML IJ SOLN
60.0000 mg | Freq: Once | INTRAMUSCULAR | Status: AC
  Administered 2021-07-04: 60 mg via INTRAVENOUS
  Filled 2021-07-04: qty 6

## 2021-07-04 MED ORDER — DM-GUAIFENESIN ER 30-600 MG PO TB12
1.0000 | ORAL_TABLET | Freq: Two times a day (BID) | ORAL | Status: DC
  Administered 2021-07-04: 1 via ORAL
  Filled 2021-07-04: qty 1

## 2021-07-04 NOTE — Progress Notes (Signed)
MD Josephine Cables came to see patient at the bedside.

## 2021-07-04 NOTE — Consult Note (Signed)
[facility] Inpatient Consult   07/04/2021  Catherine Cubero Jul 05, 1940 [id number]  [place] [facility] CM)   Patient chart has been reviewed with noted extreme high risk score for unplanned readmissions.  Patient assessed for community [place] [facility] follow up needs. Per review, noted that patient is long term resident at [facility]. No [facility] CM needs.  Of note, [facility] Care Management services does not replace or interfere with any services that are arranged by inpatient case management or social work.    Netta Cedars, MSN, RN [facility] Liaison Toll free office [phone]

## 2021-07-04 NOTE — Discharge Summary (Addendum)
Physician Discharge Summary   Patient: Jody [name] MRN: [id number] DOB: [birth year]  Admit date:     07/02/2021  Discharge date: 07/04/21  Discharge Physician: Flora Lipps   PCP: Monico Blitz, MD   Recommendations at discharge:   Follow-up with your primary care physician in 1 to 2 weeks.  Continue hemodialysis as outpatient.   Follow-up with nephrology and cardiology as has been scheduled by the clinic  Continue incentive spirometry at the skilled nursing facility with encouragement for deep breathing.  Discharge Diagnoses: Principal Problem:   Chest pain Active Problems:   Persistent atrial fibrillation (HCC)   Chronic combined systolic and diastolic CHF (congestive heart failure) (HCC)   ESRD on dialysis [facility])   Essential hypertension   Type 2 diabetes mellitus with nephropathy (HCC)   History of CVA (cerebrovascular accident)   PAD (peripheral artery disease) ([place])  Resolved Problems:   * No resolved hospital problems. [facility] Course: Jody [name] is a 81 y.o. female with medical history significant for type 2 diabetes, end-stage renal disease on hemodialysis, history of stroke, history of myocardial infarction, peripheral arterial disease, chronic arthritis and chronic pain presented to hospital with chest pain during hemodialysis with increased swelling in her hands and feet.  He did have mild dry cough.  Initial vitals were stable.    Chest x-ray showed pulmonary vascular congestion.  Patient was then admitted to the hospital for further evaluation and treatment.  Assessment and Plan:  Principal Problem:   Chest pain Active Problems:   Persistent atrial fibrillation (HCC)   Chronic combined systolic and diastolic CHF (congestive heart failure) (HCC)   ESRD on dialysis [facility])   Essential hypertension   Type 2 diabetes mellitus with nephropathy ([place])   History of CVA (cerebrovascular accident)   PAD (peripheral artery disease) ([place])     Chest  pain Patient with mildly elevated troponin but  negative delta.  Denies current chest pain.  Thought to be likely secondary to fluid overload state.  Patient had recent echo 06/03/2021 which showed septal hypokinesis.  Appears to be stable at this time.  End-stage renal disease on dialysis Patient underwent hemodialysis on 07/02/2021.  Patient is on Tuesday Thursday Saturday schedule.  Continue renal diet with fluid restriction.  Appears to be compensated at this time.  Communicated with nephrology who recommends outpatient resumption of dialysis.  Type 2 diabetes mellitus. In the diabetes.  Essential hypertension Patient takes Cardizem at home.   Chronic combined systolic and diastolic heart failure Volume management with Lasix daily and hemodialysis.  Persistent A-fib Rate controlled.  Continue Eliquis.  On Cardizem as outpatient.      Consultants: Nephrology Procedures performed: Hemodialysis Disposition:  Long term care Diet recommendation:  Discharge Diet Orders (From admission, onward)     Start     Ordered   07/03/21 0000  Diet - low sodium heart healthy        07/03/21 0956           Cardiac diet DISCHARGE MEDICATION: Allergies as of 07/04/2021       Reactions   Codeine Nausea And Vomiting        Medication List     STOP taking these medications    multivitamin Tabs tablet       TAKE these medications    acetaminophen 500 MG tablet Commonly known as: TYLENOL Take 1 tablet (500 mg total) by mouth 3 (three) times daily. What changed: Another medication with the same name was removed. Continue  taking this medication, and follow the directions you see here.   albuterol 108 (90 Base) MCG/ACT inhaler Commonly known as: VENTOLIN HFA Inhale 1-2 puffs into the lungs every 6 (six) hours as needed for wheezing or shortness of breath. Reported on 03/01/2015   albuterol 0.63 MG/3ML nebulizer solution Commonly known as: ACCUNEB Take 1 ampule by nebulization  every 6 (six) hours as needed for wheezing.   apixaban 2.5 MG Tabs tablet Commonly known as: ELIQUIS Take 2.5 mg by mouth 2 (two) times daily.   aspirin EC 81 MG tablet Take 1 tablet by mouth daily. What changed: Another medication with the same name was removed. Continue taking this medication, and follow the directions you see here.   atorvastatin 40 MG tablet Commonly known as: LIPITOR Take 1 tablet by mouth at bedtime. What changed: Another medication with the same name was removed. Continue taking this medication, and follow the directions you see here.   benzonatate 200 MG capsule Commonly known as: TESSALON Take 200 mg by mouth 3 (three) times daily as needed for cough.   cyanocobalamin 1000 MCG tablet Take 1 tablet by mouth daily. What changed: Another medication with the same name was removed. Continue taking this medication, and follow the directions you see here.   diltiazem 120 MG 24 hr capsule Commonly known as: CARDIZEM CD Take 120 mg by mouth daily.   feeding supplement (NEPRO CARB STEADY) Liqd Take 237 mLs by mouth 2 (two) times daily between meals.   ferrous sulfate 325 (65 FE) MG tablet Take 325 mg by mouth 2 (two) times daily with a meal.   folic acid 1 MG tablet Commonly known as: FOLVITE Take 1 tablet (1 mg total) by mouth daily.   furosemide 40 MG tablet Commonly known as: LASIX Take 40 mg by mouth daily.   gabapentin 300 MG capsule Commonly known as: NEURONTIN Take 300 mg by mouth daily.   guaiFENesin-dextromethorphan 100-10 MG/5ML syrup Commonly known as: ROBITUSSIN DM Take 5 mLs by mouth every 4 (four) hours as needed for up to 5 days for cough.   metoprolol tartrate 25 MG tablet Commonly known as: LOPRESSOR Take 0.5 tablets (12.5 mg total) by mouth 2 (two) times daily.   Oncovite Tabs Take 1 tablet by mouth at bedtime.   sevelamer carbonate 800 MG tablet Commonly known as: RENVELA Take 800 mg by mouth 3 (three) times daily with  meals.        Follow-up Information     Monico Blitz, MD Follow up.   Specialty: Internal Medicine Contact information: [address]  [place] [address] [phone]         Minus Breeding, MD .   Specialty: Cardiology Contact information: [address] [place] [address] [phone]                Subjective Today, patient was seen and examined at bedside.  Feels much better today with her breathing.  Has mild baseline shortness of breath   discharge Exam:    07/04/2021    9:22 AM 07/04/2021    4:32 AM 07/03/2021   10:02 PM  Vitals with BMI  Systolic 176 160 95  Diastolic 66 67 52  Pulse 69 79 60    General: Elderly female on nasal cannula oxygen, HENT:   No scleral pallor or icterus noted. Oral mucosa is moist.  Chest:    Diminished breath sounds bilaterally.  No overt wheezing CVS: S1 &S2 heard. No murmur.  Regular rate  and rhythm. Abdomen: Soft, nontender, nondistended.  Bowel sounds are heard.   Extremities: No cyanosis, clubbing or edema.  Peripheral pulses are palpable. Psych: Alert, awake and Communicative normal mood CNS:  No cranial nerve deficits.  Power equal in all extremities.   Skin: Warm and dry.  No rashes noted.   Condition at discharge: good  The results of significant diagnostics from this hospitalization (including imaging, microbiology, ancillary and laboratory) are listed below for reference.   Imaging Studies: DG Chest 1 View  Result Date: 07/03/2021 CLINICAL DATA:  Sudden onset of shortness of breath. Hypertension. Diabetes. EXAM: CHEST  1 VIEW COMPARISON:  Jul 02, 2021 FINDINGS: Stable cardiomegaly. The hila and mediastinum are unchanged. Platelike opacity in left base consistent with atelectasis. Mild haziness at the right base may represent a layering effusion. Increased interstitial markings on the right in the interval. No other interval changes or acute abnormalities. IMPRESSION: 1. Subsegmental atelectasis in the left  base. 2. Increased interstitial opacities on the right may represent mild asymmetric edema. Recommend attention on follow-up. 3. Probable small layering effusion on the right. Electronically Signed   By: Dorise Bullion III M.D.   On: 07/03/2021 16:37   CT HEAD WO CONTRAST (5MM)  Result Date: 06/04/2021 CLINICAL DATA:  Provided history: Mental status change, unknown cause. Acute mental status change with agonal respirations. EXAM: CT HEAD WITHOUT CONTRAST TECHNIQUE: Contiguous axial images were obtained from the base of the skull through the vertex without intravenous contrast. RADIATION DOSE REDUCTION: This exam was performed according to the departmental dose-optimization program which includes automated exposure control, adjustment of the mA and/or kV according to patient size and/or use of iterative reconstruction technique. COMPARISON:  Prior head CT examinations 05/31/2021 and earlier. FINDINGS: Brain: Mild generalized cerebral atrophy. Redemonstrated chronic cortical/subcortical infarcts within the right parietal and occipital lobes. Background moderate to advanced patchy and ill-defined hypoattenuation within the cerebral white matter, nonspecific but compatible with chronic small vessel ischemic disease. There is no acute intracranial hemorrhage. No acute demarcated cortical infarct. No extra-axial fluid collection. No evidence of an intracranial mass. No midline shift. Vascular: No hyperdense vessel.  Atherosclerotic calcifications. Skull: Normal. Negative for fracture or focal lesion. Sinuses/Orbits: Visualized orbits show no acute finding. Small mucous retention cyst within the left sphenoid sinus. Other: Small-volume fluid within the left mastoid air cells. Partially visualized life-support tubes. IMPRESSION: No evidence of acute intracranial abnormality. Redemonstrated chronic cortical/subcortical infarcts within the right parietal and occipital lobes. Background moderate to advanced chronic small  vessel ischemic changes within the cerebral white matter. Mild generalized cerebral atrophy. Electronically Signed   By: Kellie Simmering D.O.   On: 06/04/2021 13:16   DG Chest Port 1 View  Result Date: 07/02/2021 CLINICAL DATA:  Chest pain, dyspnea EXAM: PORTABLE CHEST 1 VIEW COMPARISON:  Previous studies including chest radiograph done on 06/10/2021 and CT chest done on 03/03/2021 FINDINGS: Transverse diameter of heart is increased. Central pulmonary vessels are slightly less prominent. There is poor inspiration. There is improvement in aeration of lower lung fields. No new focal infiltrates are seen. Costophrenic angles are clear. There is no pneumothorax. IMPRESSION: Cardiomegaly. There is interval decrease in pulmonary vascular congestion. There are no new focal infiltrates. Electronically Signed   By: Elmer Picker M.D.   On: 07/02/2021 12:28   DG Foot 2 Views Left  Result Date: 06/16/2021 Please see detailed radiograph report in office note.  DG Foot Complete Left  Result Date: 06/14/2021 Please see detailed radiograph report in office  note.  EEG adult  Result Date: 06/06/2021 Lora Havens, MD     06/06/2021  8:54 PM Patient Name: Monteen Toops MRN: [id number] Epilepsy Attending: Lora Havens Referring Physician/Provider: Orson Eva, MD Date: 06/06/2021 Duration: 22.30 mins Patient history: 81yo f with transient alteration of awareness. EEG to evaluate for seizure. Level of alertness: Awake, asleep AEDs during EEG study: None Technical aspects: This EEG study was done with scalp electrodes positioned according to the 10-20 International system of electrode placement. Electrical activity was acquired at a sampling rate of '500Hz'$  and reviewed with a high frequency filter of '70Hz'$  and a low frequency filter of '1Hz'$ . EEG data were recorded continuously and digitally stored. Description: The posterior dominant rhythm consists of 8-9 Hz activity of moderate voltage (25-35 uV) seen predominantly  in posterior head regions, symmetric and reactive to eye opening and eye closing. Sleep was characterized by vertex waves, sleep spindles (12 to 14 Hz), maximal frontocentral region. EEG showed intermittent generalized 3 to 6 Hz theta-delta slowing. Hyperventilation and photic stimulation were not performed.   ABNORMALITY - Intermittent slow, generalized IMPRESSION: This study is suggestive of mild diffuse encephalopathy, nonspecific etiology. No seizures or epileptiform discharges were seen throughout the recording. Lora Havens    Microbiology: Results for orders placed or performed during the hospital encounter of 07/02/21  Resp Panel by RT-PCR (Flu A&B, Covid) Nasopharyngeal Swab     Status: None   Collection Time: 07/02/21  4:15 PM   Specimen: Nasopharyngeal Swab; Nasopharyngeal(NP) swabs in vial transport medium  Result Value Ref Range Status   SARS Coronavirus 2 by RT PCR NEGATIVE NEGATIVE Final    Comment: (NOTE) SARS-CoV-2 target nucleic acids are NOT DETECTED.  The SARS-CoV-2 RNA is generally detectable in upper respiratory specimens during the acute phase of infection. The lowest concentration of SARS-CoV-2 viral copies this assay can detect is 138 copies/mL. A negative result does not preclude SARS-Cov-2 infection and should not be used as the sole basis for treatment or other patient management decisions. A negative result may occur with  improper specimen collection/handling, submission of specimen other than nasopharyngeal swab, presence of viral mutation(s) within the areas targeted by this assay, and inadequate number of viral copies(<138 copies/mL). A negative result must be combined with clinical observations, patient history, and epidemiological information. The expected result is Negative.  Fact Sheet for Patients:  EntrepreneurPulse.com.au  Fact Sheet for Healthcare Providers:  IncredibleEmployment.be  This test is no t yet  approved or cleared by the [place] FDA and  has been authorized for detection and/or diagnosis of SARS-CoV-2 by FDA under an Emergency Use Authorization (EUA). This EUA will remain  in effect (meaning this test can be used) for the duration of the COVID-19 declaration under Section 564(b)(1) of the Act, 21 U.S.C.section 360bbb-3(b)(1), unless the authorization is terminated  or revoked sooner.       Influenza A by PCR NEGATIVE NEGATIVE Final   Influenza B by PCR NEGATIVE NEGATIVE Final    Comment: (NOTE) The Xpert Xpress SARS-CoV-2/FLU/RSV plus assay is intended as an aid in the diagnosis of influenza from Nasopharyngeal swab specimens and should not be used as a sole basis for treatment. Nasal washings and aspirates are unacceptable for Xpert Xpress SARS-CoV-2/FLU/RSV testing.  Fact Sheet for Patients: EntrepreneurPulse.com.au  Fact Sheet for Healthcare Providers: IncredibleEmployment.be  This test is not yet approved or cleared by the [place] FDA and has been authorized for detection and/or diagnosis of SARS-CoV-2 by FDA under an  Emergency Use Authorization (EUA). This EUA will remain in effect (meaning this test can be used) for the duration of the COVID-19 declaration under Section 564(b)(1) of the Act, 21 U.S.C. section 360bbb-3(b)(1), unless the authorization is terminated or revoked.  Performed at [facility], [address], [place], [place] [address]     Labs: CBC: Recent Labs  Lab 07/02/21 1213 07/03/21 0526  WBC 3.6* 3.7*  HGB 10.1* 9.4*  HCT 34.4* 32.6*  MCV 114.7* 115.2*  PLT 95* 80*   Basic Metabolic Panel: Recent Labs  Lab 07/02/21 1213 07/03/21 0526  NA 139 139  K 4.2 3.7  CL 100 100  CO2 29 32  GLUCOSE 141* 130*  BUN 32* 25*  CREATININE 4.11* 3.30*  CALCIUM 9.7 9.1   Liver Function Tests: No results for input(s): AST, ALT, ALKPHOS, BILITOT, PROT, ALBUMIN in the last 168 hours. CBG: Recent  Labs  Lab 07/03/21 1425 07/03/21 1658 07/03/21 2130 07/04/21 0719 07/04/21 1105  GLUCAP 152* 159* 83 83 122*    Discharge time spent: greater than 30 minutes.  Signed: Flora Lipps, MD [facility] 07/04/2021

## 2021-07-04 NOTE — Progress Notes (Signed)
RN called due to patient complaining of having shortness of breath and not being able to catch her breath.  I presented to bedside, both are symptoms already subsided.  Patient was able to maintain conversation without being short of breath.  Shortly after this, she had another episode in which patient had a coughing fit, cough was nonproductive and she complained of chest hurting when she coughs.  This is possibly pleuritic in nature.  BP 111/67 (BP Location: Left Arm)   Pulse 79   Temp (!) 97.5 F (36.4 C) (Oral)   Resp (!) 22   Ht '5\' 2"'$  (1.575 m)   Wt 81.1 kg   SpO2 99%   BMI 32.70 kg/m   Decreased breath sounds in lower lobes on auscultation. Chest x-ray done yesterday showed 1.  Subsegmental atelectasis in the left base.  2.Increased interstitial opacities on the right may represent mild asymmetric edema. Recommend attention on follow-up. 3. Probable small layering effusion on the right. IV Lasix 60 mg x 1 was given yesterday Mucinex and Robitussin was provided.   Tylenol will be given and incentive spirometry will be provided  We shall continue to monitor patient and treat accordingly.  Total time:  22 minutes This includes time reviewing the chart including progress notes, labs, EKGs, taking medical decisions, ordering labs and documenting findings.

## 2021-07-04 NOTE — TOC Transition Note (Addendum)
Transition of Care [facility]) - CM/SW Discharge Note   Patient Details  [name] MRN: [id number] Date of Birth: Jun 07, 1940  [facility] CM/SW Contact:  Salome Arnt, [place] Phone Number: 07/04/2021, 11:15 AM   Clinical Narrative:  Pt d/c today back to [facility]. Pt and pt's niece, Pam aware and agreeable. Pt requests transport via [facility]. TOC arranged. RN given number to call report. D/C summary faxed. Per Melanie at SNF, no FL2 needed. Pt is long term resident.      Final next level of care: Skilled Nursing Facility Barriers to Discharge: Barriers Resolved   Patient Goals and CMS Choice        Discharge Placement                Patient to be transferred to facility by: [facility] Name of family member notified: Pam- niece Patient and family notified of of transfer: 07/04/21  Discharge Plan and Services                                     Social Determinants of Health (SDOH) Interventions     Readmission Risk Interventions    06/07/2021    2:47 PM 06/01/2021   10:59 AM 12/30/2019    1:03 PM  Readmission Risk Prevention Plan  Transportation Screening Complete Complete Complete  HRI or Home Care Consult   Complete  Social Work Consult for [place] Planning/Counseling   Complete  Palliative Care Screening   Not Complete  Medication Review [employer] photographer) Complete Complete Complete  PCP or Specialist appointment within 3-5 days of discharge Complete    HRI or Home Care Consult Complete Complete   SW Recovery Care/Counseling Consult Complete Complete   Palliative Care Screening Complete Not Applicable   Skilled Nursing Facility Complete Complete

## 2021-07-04 NOTE — Consult Note (Signed)
[facility] Renal Consultation Note    Indication for Consultation:  Management of ESRD/hemodialysis; anemia, hypertension/volume and secondary hyperparathyroidism  HPI: Jody [name] is a 81 y.o. female  with a PMH significant for HTN, DM type 2, h/o SSS, h/o CVA, COPD, HLD, chronic atrial fibrillation, recent osteomyelitis of left foot, and ESRD who presented to [facility] ED on 07/02/21 with chest pain and SOB while at dialysis.  She was complaining of cough that is nonproductive and was admitted for further evaluation.  Workup was negative except for some pulmonary vascular congestion on CXR. She underwent HD on 07/02/21 and was set for discharge yesterday when she developed worsening SOB and cough.  CXR was performed without significant change from admission.  We were consulted to provide dialysis during her hospitalization.    She is feeling better today without SOB or cough.  Past Medical History:  Diagnosis Date   Anemia in chronic kidney disease (CODE) 06/05/2015   Arthritis    "bad in my legs" (12/07/2014)   Chronic atrial fibrillation ([place]) 06/05/2015   Chronic back pain    "dr said my spine is crooked"   Chronic bronchitis ([place])    "get it q yr" (12/07/2014)   CKD stage 4 due to type 2 diabetes mellitus ([place]) 03/27/2480   Complication of anesthesia    headache for 2 days after surgery in May 2019   COPD (chronic obstructive pulmonary disease) ([place])    Gait difficulty 09/02/2014   Hypercholesterolemia    Hypertension    Neuropathy 2015   in legs   NSVT (nonsustained ventricular tachycardia) ([place])    Sinus brady-tachy syndrome ([place])    Stroke ([place]) 05/2014   denies residual on 12/07/2014   Type II diabetes mellitus [facility])    Past Surgical History:  Procedure Laterality Date   ABDOMINAL AORTOGRAM W/LOWER EXTREMITY Left 04/06/2021   Procedure: ABDOMINAL AORTOGRAM W/LOWER EXTREMITY;  Surgeon: Broadus John, MD;  Location: [place] [facility];  Service: Cardiovascular;   Laterality: Left;   ABDOMINAL HYSTERECTOMY  ~ 1970   AMPUTATION TOE Left 04/08/2021   Procedure: FIRST TOE LEFT FOOT AMPUTATION;  Surgeon: Trula Slade, DPM;  Location: [place];  Service: Podiatry;  Laterality: Left;   APPENDECTOMY  ~ [place] Right 07/06/2017   Procedure: RIGHT RADIOCEPHALIC ARTERIOVENOUS FISTULA creation;  Surgeon: Conrad [place], MD;  Location: [place];  Service: Vascular;  Laterality: Right;   [place] Right 09/17/2017   Procedure: SECOND STAGE BASILIC VEIN TRANSPOSITION RIGHT ARM;  Surgeon: Rosetta Posner, MD;  Location: [place];  Service: Vascular;  Laterality: Right;   COLONOSCOPY N/A 10/28/2017   Procedure: COLONOSCOPY;  Surgeon: Rogene Houston, MD;  [facility];  Service: Endoscopy;  Laterality: N/A;   COLONOSCOPY WITH PROPOFOL N/A 12/29/2019   Procedure: COLONOSCOPY WITH PROPOFOL;  Surgeon: Eloise Harman, DO;  [facility];  Service: Endoscopy;  Laterality: N/A;   ESOPHAGOGASTRODUODENOSCOPY (EGD) WITH PROPOFOL N/A 10/31/2017   Procedure: ESOPHAGOGASTRODUODENOSCOPY (EGD) WITH PROPOFOL;  Surgeon: Rogene Houston, MD;  [facility];  Service: Endoscopy;  Laterality: N/A;   JOINT REPLACEMENT     PERIPHERAL VASCULAR BALLOON ANGIOPLASTY Left 04/06/2021   Procedure: PERIPHERAL VASCULAR BALLOON ANGIOPLASTY;  Surgeon: Broadus John, MD;  Location: [place] [facility];  Service: Cardiovascular;  Laterality: Left;  left sfa succesful [place] AT Unsuccesful   TOTAL KNEE ARTHROPLASTY Right ~ 1986   TUMOR EXCISION  ~  1970   "9# in my stomach"   Family History:   Family History  Problem Relation Age of Onset   Cancer Other    Heart attack Brother    Cancer Sister        breast   Alcohol abuse Brother    Social History:  reports that she quit smoking about 17 years ago. Her smoking use included cigarettes. She started smoking about 68 years ago. She has a 25.00 pack-year smoking history. She has never  used smokeless tobacco. She reports that she does not drink alcohol and does not use drugs. Allergies  Allergen Reactions   Codeine Nausea And Vomiting   Prior to Admission medications   Medication Sig Start Date End Date Taking? Authorizing Provider  atorvastatin (LIPITOR) 40 MG tablet Take 1 tablet by mouth at bedtime. 01/22/16  Yes [provider]  acetaminophen (TYLENOL) 500 MG tablet Take 1 tablet (500 mg total) by mouth 3 (three) times daily. 04/13/21   Regalado, Belkys A, MD  acetaminophen (TYLENOL) 500 MG tablet Take by mouth.    [provider]  albuterol (ACCUNEB) 0.63 MG/3ML nebulizer solution Take 1 ampule by nebulization every 6 (six) hours as needed for wheezing.    [provider]  albuterol (PROVENTIL HFA;VENTOLIN HFA) 108 (90 BASE) MCG/ACT inhaler Inhale 1-2 puffs into the lungs every 6 (six) hours as needed for wheezing or shortness of breath. Reported on 03/01/2015    [provider]  apixaban (ELIQUIS) 2.5 MG TABS tablet Take 2.5 mg by mouth 2 (two) times daily.    [provider]  aspirin EC 81 MG EC tablet Take 1 tablet (81 mg total) by mouth daily. Swallow whole. 04/14/21   Regalado, Jerald Kief A, MD  aspirin EC 81 MG tablet Take 1 tablet by mouth daily.    [provider]  atorvastatin (LIPITOR) 40 MG tablet Take 1 tablet (40 mg total) by mouth daily. 04/14/21   Regalado, Belkys A, MD  benzonatate (TESSALON) 200 MG capsule Take 200 mg by mouth 3 (three) times daily as needed for cough.    [provider]  cyanocobalamin 1000 MCG tablet Take 1 tablet by mouth daily.    [provider]  diltiazem (CARDIZEM CD) 120 MG 24 hr capsule Take 120 mg by mouth daily. 05/12/21   [provider]  ferrous sulfate 325 (65 FE) MG tablet Take 325 mg by mouth 2 (two) times daily with a meal.    [provider]  folic acid (FOLVITE) 1 MG tablet Take 1 tablet (1 mg total) by mouth daily. 06/08/21   Orson Eva, MD   furosemide (LASIX) 40 MG tablet Take 40 mg by mouth daily. 05/12/21   [provider]  gabapentin (NEURONTIN) 300 MG capsule Take 300 mg by mouth daily. 05/11/21   [provider]  metoprolol tartrate (LOPRESSOR) 25 MG tablet Take 0.5 tablets (12.5 mg total) by mouth 2 (two) times daily. 10/18/16   Kathie Dike, MD  Multiple Vitamins-Minerals (ONCOVITE) TABS Take 1 tablet by mouth at bedtime.    [provider]  multivitamin (RENA-VIT) TABS tablet Take 1 tablet by mouth at bedtime. 06/07/21   Orson Eva, MD  Nutritional Supplements (FEEDING SUPPLEMENT, NEPRO CARB STEADY,) LIQD Take 237 mLs by mouth 2 (two) times daily between meals. 06/07/21   Orson Eva, MD  sevelamer carbonate (RENVELA) 800 MG tablet Take 800 mg by mouth 3 (three) times daily with meals.  07/22/19   [provider]  vitamin B-12 100 MCG tablet Take 1 tablet (100 mcg total) by mouth daily. 04/14/21   Regalado, Cassie Freer, MD   Current Facility-Administered Medications  Medication Dose Route Frequency Provider Last Rate Last Admin   0.9 %  sodium chloride infusion  100 mL Intravenous PRN Truett Mainland, DO       0.9 %  sodium chloride infusion  100 mL Intravenous PRN Stinson, Jacob J, DO       acetaminophen (TYLENOL) tablet 650 mg  650 mg Oral Q6H PRN Adefeso, Oladapo, DO   650 mg at 07/04/21 0526   alteplase (CATHFLO ACTIVASE) injection 2 mg  2 mg Intracatheter Once PRN Truett Mainland, DO       apixaban Arne [place]) tablet 2.5 mg  2.5 mg Oral BID Truett Mainland, DO   2.5 mg at 07/03/21 2208   aspirin EC tablet 81 mg  81 mg Oral Daily Truett Mainland, DO   81 mg at 07/03/21 0803   atorvastatin (LIPITOR) tablet 40 mg  40 mg Oral Daily Truett Mainland, DO   40 mg at 07/03/21 0263   benzonatate (TESSALON) capsule 200 mg  200 mg Oral TID PRN Truett Mainland, DO       Chlorhexidine Gluconate Cloth 2 % PADS 6 each  6 each Topical Q0600 Truett Mainland, DO   6 each at 07/03/21 7858    dextromethorphan-guaiFENesin (MUCINEX DM) 30-600 MG per 12 hr tablet 1 tablet  1 tablet Oral BID Adefeso, Oladapo, DO       feeding supplement (NEPRO CARB STEADY) liquid 237 mL  237 mL Oral BID BM Truett Mainland, DO   237 mL at 07/03/21 8502   ferrous sulfate tablet 325 mg  325 mg Oral BID WC Truett Mainland, DO   325 mg at 77/41/28 7867   folic acid (FOLVITE) tablet 1 mg  1 mg Oral Daily Truett Mainland, DO   1 mg at 07/03/21 6720   gabapentin (NEURONTIN) capsule 300 mg  300 mg Oral Daily Truett Mainland, DO   300 mg at 07/03/21 0803   guaiFENesin-dextromethorphan (ROBITUSSIN DM) 100-10 MG/5ML syrup 5 mL  5 mL Oral Q4H PRN Adefeso, Oladapo, DO   5 mL at 07/04/21 9470   heparin injection 1,000 Units  1,000 Units Dialysis PRN Truett Mainland, DO       insulin aspart (novoLOG) injection 0-15 Units  0-15 Units Subcutaneous TID WC Truett Mainland, DO   3 Units at 07/03/21 1725   insulin aspart (novoLOG) injection 0-5 Units  0-5 Units Subcutaneous QHS Stinson, Jacob J, DO       lidocaine (PF) (XYLOCAINE) 1 % injection 5 mL  5 mL Intradermal PRN Truett Mainland, DO       lidocaine-prilocaine (EMLA) cream 1 application.  1 application. Topical PRN Truett Mainland, DO       metoprolol tartrate (LOPRESSOR) tablet 12.5 mg  12.5 mg Oral BID Truett Mainland, DO   12.5 mg at 07/02/21 2128   ondansetron (ZOFRAN) tablet 4 mg  4 mg Oral Q6H PRN Truett Mainland, DO       Or   ondansetron [facility]) injection 4 mg  4 mg Intravenous Q6H PRN Truett Mainland, DO       pentafluoroprop-tetrafluoroeth (GEBAUERS) aerosol 1 application.  1 application. Topical PRN Truett Mainland, DO       sevelamer carbonate (RENVELA) tablet 800 mg  800 mg Oral TID WC  Truett Mainland, DO   800 mg at 07/03/21 1635   vitamin B-12 (CYANOCOBALAMIN) tablet 100 mcg  100 mcg Oral Daily Truett Mainland, DO   100 mcg at 07/03/21 0803   Labs: Basic Metabolic Panel: Recent Labs  Lab 07/02/21 1213 07/03/21 0526  NA 139 139  K 4.2  3.7  CL 100 100  CO2 29 32  GLUCOSE 141* 130*  BUN 32* 25*  CREATININE 4.11* 3.30*  CALCIUM 9.7 9.1   Liver Function Tests: No results for input(s): AST, ALT, ALKPHOS, BILITOT, PROT, ALBUMIN in the last 168 hours. No results for input(s): LIPASE, AMYLASE in the last 168 hours. No results for input(s): AMMONIA in the last 168 hours. CBC: Recent Labs  Lab 07/02/21 1213 07/03/21 0526  WBC 3.6* 3.7*  HGB 10.1* 9.4*  HCT 34.4* 32.6*  MCV 114.7* 115.2*  PLT 95* 80*   Cardiac Enzymes: No results for input(s): CKTOTAL, CKMB, CKMBINDEX, TROPONINI in the last 168 hours. CBG: Recent Labs  Lab 07/03/21 1131 07/03/21 1425 07/03/21 1658 07/03/21 2130 07/04/21 0719  GLUCAP 153* 152* 159* 83 83   Iron Studies: No results for input(s): IRON, TIBC, TRANSFERRIN, FERRITIN in the last 72 hours. Studies/Results: DG Chest 1 View  Result Date: 07/03/2021 CLINICAL DATA:  Sudden onset of shortness of breath. Hypertension. Diabetes. EXAM: CHEST  1 VIEW COMPARISON:  Jul 02, 2021 FINDINGS: Stable cardiomegaly. The hila and mediastinum are unchanged. Platelike opacity in left base consistent with atelectasis. Mild haziness at the right base may represent a layering effusion. Increased interstitial markings on the right in the interval. No other interval changes or acute abnormalities. IMPRESSION: 1. Subsegmental atelectasis in the left base. 2. Increased interstitial opacities on the right may represent mild asymmetric edema. Recommend attention on follow-up. 3. Probable small layering effusion on the right. Electronically Signed   By: Dorise Bullion III M.D.   On: 07/03/2021 16:37   DG Chest Port 1 View  Result Date: 07/02/2021 CLINICAL DATA:  Chest pain, dyspnea EXAM: PORTABLE CHEST 1 VIEW COMPARISON:  Previous studies including chest radiograph done on 06/10/2021 and CT chest done on 03/03/2021 FINDINGS: Transverse diameter of heart is increased. Central pulmonary vessels are slightly less  prominent. There is poor inspiration. There is improvement in aeration of lower lung fields. No new focal infiltrates are seen. Costophrenic angles are clear. There is no pneumothorax. IMPRESSION: Cardiomegaly. There is interval decrease in pulmonary vascular congestion. There are no new focal infiltrates. Electronically Signed   By: Elmer Picker M.D.   On: 07/02/2021 12:28    ROS: Pertinent items are noted in HPI. Physical Exam: Vitals:   07/03/21 1721 07/03/21 1752 07/03/21 2202 07/04/21 0432  BP:  94/69 (!) 95/52 111/67  Pulse:  63 60 79  Resp: (!) 27 (!) 24  (!) 22  Temp:  97.7 F (36.5 C)  (!) 97.5 F (36.4 C)  TempSrc:    Oral  SpO2: 100% 100% 100% 99%  Weight:      Height:          Weight change:   Intake/Output Summary (Last 24 hours) at 07/04/2021 0847 Last data filed at 07/03/2021 2300 Gross per 24 hour  Intake 360 ml  Output --  Net 360 ml   BP 111/67 (BP Location: Left Arm)   Pulse 79   Temp (!) 97.5 F (36.4 C) (Oral)   Resp (!) 22   Ht '5\' 2"'$  (1.575 m)   Wt 81.1 kg   SpO2  99%   BMI 32.70 kg/m  General appearance: alert, cooperative, and no distress Head: Normocephalic, without obvious abnormality, atraumatic Resp: clear to auscultation bilaterally Cardio: regular rate and rhythm, S1, S2 normal, no murmur, click, rub or gallop GI: soft, non-tender; bowel sounds normal; no masses,  no organomegaly Extremities: edema 1+ pretibial edema and RUE AVF +T/B Dialysis Access:  Dialysis Orders: Center: [facility]  on TTS . EDW 82.5Kg HD Bath 2K/2.5Ca  Time 3:15 Heparin none. Access RUE AVF BFR 400 DFR 500    Micera 200 mcg IV every 2 weeks  Assessment/Plan:  Chest pain associated with SOB and cough - troponins and respiratory panel negative.  Cough improved with robitussin DM.   ESRD -  Will continue with HD on TTS schedule while she remains an inpatient.  Hypertension/volume  - stable  Anemia  - stable.  Continue with ESA  Metabolic bone disease -   stable continue with home meds  Nutrition - renal diet, carb modified. Disposition - hopeful discharge to home today.  Donetta Potts, MD [place] 07/04/2021, 8:47 AM

## 2021-07-04 NOTE — Progress Notes (Signed)
Received new orders. Robitussin DM given. Will continue to monitor.

## 2021-07-04 NOTE — Progress Notes (Signed)
This nurse called report to RN at [facility]. EMS has been contacted for transport.

## 2021-07-04 NOTE — Progress Notes (Signed)
Patient woke up coughing. C/o SOB. Said she couldn't catch her breath. Vitals checked. Oxygen 99 % on 2L. RR 22. Denying any chest pain at this time. MD Adesfeso made aware. Respiratory made aware. Will continue to monitor.

## 2021-07-05 DIAGNOSIS — N250 Renal osteodystrophy: Secondary | ICD-10-CM | POA: Diagnosis not present

## 2021-07-05 DIAGNOSIS — Z992 Dependence on renal dialysis: Secondary | ICD-10-CM | POA: Diagnosis not present

## 2021-07-05 DIAGNOSIS — Z23 Encounter for immunization: Secondary | ICD-10-CM | POA: Diagnosis not present

## 2021-07-05 DIAGNOSIS — N186 End stage renal disease: Secondary | ICD-10-CM | POA: Diagnosis not present

## 2021-07-05 DIAGNOSIS — E559 Vitamin D deficiency, unspecified: Secondary | ICD-10-CM | POA: Diagnosis not present

## 2021-07-05 DIAGNOSIS — N2581 Secondary hyperparathyroidism of renal origin: Secondary | ICD-10-CM | POA: Diagnosis not present

## 2021-07-07 DIAGNOSIS — N186 End stage renal disease: Secondary | ICD-10-CM | POA: Diagnosis not present

## 2021-07-07 DIAGNOSIS — Z23 Encounter for immunization: Secondary | ICD-10-CM | POA: Diagnosis not present

## 2021-07-07 DIAGNOSIS — Z992 Dependence on renal dialysis: Secondary | ICD-10-CM | POA: Diagnosis not present

## 2021-07-07 DIAGNOSIS — N250 Renal osteodystrophy: Secondary | ICD-10-CM | POA: Diagnosis not present

## 2021-07-07 DIAGNOSIS — E559 Vitamin D deficiency, unspecified: Secondary | ICD-10-CM | POA: Diagnosis not present

## 2021-07-07 DIAGNOSIS — N2581 Secondary hyperparathyroidism of renal origin: Secondary | ICD-10-CM | POA: Diagnosis not present

## 2021-07-08 ENCOUNTER — Ambulatory Visit (INDEPENDENT_AMBULATORY_CARE_PROVIDER_SITE_OTHER): Payer: Medicare Other | Admitting: Podiatry

## 2021-07-08 DIAGNOSIS — M79674 Pain in right toe(s): Secondary | ICD-10-CM | POA: Diagnosis not present

## 2021-07-08 DIAGNOSIS — B351 Tinea unguium: Secondary | ICD-10-CM

## 2021-07-08 DIAGNOSIS — Z7901 Long term (current) use of anticoagulants: Secondary | ICD-10-CM | POA: Diagnosis not present

## 2021-07-08 DIAGNOSIS — M79675 Pain in left toe(s): Secondary | ICD-10-CM

## 2021-07-08 DIAGNOSIS — I739 Peripheral vascular disease, unspecified: Secondary | ICD-10-CM

## 2021-07-09 DIAGNOSIS — N186 End stage renal disease: Secondary | ICD-10-CM | POA: Diagnosis not present

## 2021-07-09 DIAGNOSIS — N250 Renal osteodystrophy: Secondary | ICD-10-CM | POA: Diagnosis not present

## 2021-07-09 DIAGNOSIS — E559 Vitamin D deficiency, unspecified: Secondary | ICD-10-CM | POA: Diagnosis not present

## 2021-07-09 DIAGNOSIS — Z992 Dependence on renal dialysis: Secondary | ICD-10-CM | POA: Diagnosis not present

## 2021-07-09 DIAGNOSIS — Z23 Encounter for immunization: Secondary | ICD-10-CM | POA: Diagnosis not present

## 2021-07-09 DIAGNOSIS — N2581 Secondary hyperparathyroidism of renal origin: Secondary | ICD-10-CM | POA: Diagnosis not present

## 2021-07-11 NOTE — Progress Notes (Signed)
Subjective: 81 year old female presents the office today for follow-up evaluation after undergoing partial first ray amputation on the left side.  She said the amputation site is been doing well she denied any issues with this.  She has not seen any swelling or redness or any drainage.  She has been in some pain to her heels.  No injuries that she reports.  No open lesions that she has noticed.  Also asking for the nails be trimmed today as are thickened elongated she cannot trim them herself.  Objective: AAO x3, NAD Status post partial first amputation left foot with sutures intact without any evidence of dehiscence.  At the distal aspect the incision is small amount of scabbing still present but there is no evidence of any distance.  There is no edema, erythema, drainage or pus or any signs of infection. She is in discomfort mostly the right heel but is no open lesions or erythema.  No significant pain on exam but is more to the posterior heel which I think could be coming from pressure. Nails are hypertrophic, dystrophic with brown discoloration.  Nails affected are 2 through 5 on the left and 1 through 5 on the right.  No edema, erythema.  Tenderness nails x9. No pain with calf compression, swelling, warmth, erythema     Assessment: Status post partial first amputation, PAD  Plan: -All treatment options discussed with the patient including all alternatives, risks, complications.  -Incision appears to be healing well.  Small scabbing still present the distal aspect.  Continue to wash the foot with soap and water, dry thoroughly.  Apply a small amount of Betadine to the area followed by a bandage. -Recommended offloading for the heels.  Discussed floating her heels particularly while in bed. -Nails Debrided x9 without any complications or bleeding. -Monitor for any clinical signs or symptoms of infection and directed to call the office immediately should any occur or go to the ER.  Trula Slade DPM

## 2021-07-12 DIAGNOSIS — Z992 Dependence on renal dialysis: Secondary | ICD-10-CM | POA: Diagnosis not present

## 2021-07-12 DIAGNOSIS — E559 Vitamin D deficiency, unspecified: Secondary | ICD-10-CM | POA: Diagnosis not present

## 2021-07-12 DIAGNOSIS — N186 End stage renal disease: Secondary | ICD-10-CM | POA: Diagnosis not present

## 2021-07-12 DIAGNOSIS — Z23 Encounter for immunization: Secondary | ICD-10-CM | POA: Diagnosis not present

## 2021-07-12 DIAGNOSIS — N250 Renal osteodystrophy: Secondary | ICD-10-CM | POA: Diagnosis not present

## 2021-07-12 DIAGNOSIS — N2581 Secondary hyperparathyroidism of renal origin: Secondary | ICD-10-CM | POA: Diagnosis not present

## 2021-07-13 DIAGNOSIS — N184 Chronic kidney disease, stage 4 (severe): Secondary | ICD-10-CM | POA: Diagnosis not present

## 2021-07-13 DIAGNOSIS — R2689 Other abnormalities of gait and mobility: Secondary | ICD-10-CM | POA: Diagnosis not present

## 2021-07-13 DIAGNOSIS — E1122 Type 2 diabetes mellitus with diabetic chronic kidney disease: Secondary | ICD-10-CM | POA: Diagnosis not present

## 2021-07-13 DIAGNOSIS — N186 End stage renal disease: Secondary | ICD-10-CM | POA: Diagnosis not present

## 2021-07-13 DIAGNOSIS — M6281 Muscle weakness (generalized): Secondary | ICD-10-CM | POA: Diagnosis not present

## 2021-07-13 DIAGNOSIS — J449 Chronic obstructive pulmonary disease, unspecified: Secondary | ICD-10-CM | POA: Diagnosis not present

## 2021-07-13 DIAGNOSIS — Z992 Dependence on renal dialysis: Secondary | ICD-10-CM | POA: Diagnosis not present

## 2021-07-14 DIAGNOSIS — E559 Vitamin D deficiency, unspecified: Secondary | ICD-10-CM | POA: Diagnosis not present

## 2021-07-14 DIAGNOSIS — G9341 Metabolic encephalopathy: Secondary | ICD-10-CM | POA: Diagnosis not present

## 2021-07-14 DIAGNOSIS — R2689 Other abnormalities of gait and mobility: Secondary | ICD-10-CM | POA: Diagnosis not present

## 2021-07-14 DIAGNOSIS — M6281 Muscle weakness (generalized): Secondary | ICD-10-CM | POA: Diagnosis not present

## 2021-07-14 DIAGNOSIS — Z992 Dependence on renal dialysis: Secondary | ICD-10-CM | POA: Diagnosis not present

## 2021-07-14 DIAGNOSIS — N2581 Secondary hyperparathyroidism of renal origin: Secondary | ICD-10-CM | POA: Diagnosis not present

## 2021-07-14 DIAGNOSIS — N186 End stage renal disease: Secondary | ICD-10-CM | POA: Diagnosis not present

## 2021-07-14 DIAGNOSIS — N250 Renal osteodystrophy: Secondary | ICD-10-CM | POA: Diagnosis not present

## 2021-07-15 DIAGNOSIS — M6281 Muscle weakness (generalized): Secondary | ICD-10-CM | POA: Diagnosis not present

## 2021-07-15 DIAGNOSIS — R2689 Other abnormalities of gait and mobility: Secondary | ICD-10-CM | POA: Diagnosis not present

## 2021-07-15 DIAGNOSIS — G9341 Metabolic encephalopathy: Secondary | ICD-10-CM | POA: Diagnosis not present

## 2021-07-16 DIAGNOSIS — E559 Vitamin D deficiency, unspecified: Secondary | ICD-10-CM | POA: Diagnosis not present

## 2021-07-16 DIAGNOSIS — N186 End stage renal disease: Secondary | ICD-10-CM | POA: Diagnosis not present

## 2021-07-16 DIAGNOSIS — Z992 Dependence on renal dialysis: Secondary | ICD-10-CM | POA: Diagnosis not present

## 2021-07-16 DIAGNOSIS — N250 Renal osteodystrophy: Secondary | ICD-10-CM | POA: Diagnosis not present

## 2021-07-16 DIAGNOSIS — N2581 Secondary hyperparathyroidism of renal origin: Secondary | ICD-10-CM | POA: Diagnosis not present

## 2021-07-18 ENCOUNTER — Encounter (HOSPITAL_BASED_OUTPATIENT_CLINIC_OR_DEPARTMENT_OTHER): Payer: Self-pay | Admitting: Family

## 2021-07-18 ENCOUNTER — Ambulatory Visit (INDEPENDENT_AMBULATORY_CARE_PROVIDER_SITE_OTHER): Payer: Medicare Other | Admitting: Family

## 2021-07-18 VITALS — BP 108/70 | HR 72 | Ht 67.0 in | Wt 197.5 lb

## 2021-07-18 DIAGNOSIS — D6859 Other primary thrombophilia: Secondary | ICD-10-CM

## 2021-07-18 DIAGNOSIS — I482 Chronic atrial fibrillation, unspecified: Secondary | ICD-10-CM

## 2021-07-18 DIAGNOSIS — E782 Mixed hyperlipidemia: Secondary | ICD-10-CM

## 2021-07-18 DIAGNOSIS — I5042 Chronic combined systolic (congestive) and diastolic (congestive) heart failure: Secondary | ICD-10-CM | POA: Diagnosis not present

## 2021-07-18 DIAGNOSIS — G9341 Metabolic encephalopathy: Secondary | ICD-10-CM | POA: Diagnosis not present

## 2021-07-18 DIAGNOSIS — I739 Peripheral vascular disease, unspecified: Secondary | ICD-10-CM

## 2021-07-18 DIAGNOSIS — M6281 Muscle weakness (generalized): Secondary | ICD-10-CM | POA: Diagnosis not present

## 2021-07-18 DIAGNOSIS — R2689 Other abnormalities of gait and mobility: Secondary | ICD-10-CM | POA: Diagnosis not present

## 2021-07-18 NOTE — Progress Notes (Signed)
Office Visit    Patient [name] Date of Encounter: 07/18/2021  PCP:  Monico Blitz, [place]  Cardiologist:  Minus Breeding, MD  Advanced Practice Provider:  No care team member to display Electrophysiologist:  None      Chief Complaint    Jody [name] is a 81 y.o. female with a hx of PAD, atrial fibrillation, chronic systolic and diastolic heart failure, NSVT, ESRD on dialysis presents today for hospital follow up   Past Medical History    Past Medical History:  Diagnosis Date   Anemia in chronic kidney disease (CODE) 06/05/2015   Arthritis    "bad in my legs" (12/07/2014)   Chronic atrial fibrillation ([place]) 06/05/2015   Chronic back pain    "dr said my spine is crooked"   Chronic bronchitis ([place])    "get it q yr" (12/07/2014)   CKD stage 4 due to type 2 diabetes mellitus ([place]) 4/40/3474   Complication of anesthesia    headache for 2 days after surgery in May 2019   COPD (chronic obstructive pulmonary disease) ([place])    Gait difficulty 09/02/2014   Hypercholesterolemia    Hypertension    Neuropathy 2015   in legs   NSVT (nonsustained ventricular tachycardia) ([place])    Sinus brady-tachy syndrome ([place])    Stroke ([place]) 05/2014   denies residual on 12/07/2014   Type II diabetes mellitus [facility])    Past Surgical History:  Procedure Laterality Date   ABDOMINAL AORTOGRAM W/LOWER EXTREMITY Left 04/06/2021   Procedure: ABDOMINAL AORTOGRAM W/LOWER EXTREMITY;  Surgeon: Broadus John, MD;  Location: [facility];  Service: Cardiovascular;  Laterality: Left;   ABDOMINAL HYSTERECTOMY  ~ 1970   AMPUTATION TOE Left 04/08/2021   Procedure: FIRST TOE LEFT FOOT AMPUTATION;  Surgeon: Trula Slade, DPM;  Location: [place];  Service: Podiatry;  Laterality: Left;   APPENDECTOMY  ~ [place] Right 07/06/2017   Procedure: RIGHT RADIOCEPHALIC ARTERIOVENOUS FISTULA creation;  Surgeon: Conrad Tupelo, MD;  Location: [place];  Service: Vascular;  Laterality: Right;   [place] Right 09/17/2017   Procedure: SECOND STAGE BASILIC VEIN TRANSPOSITION RIGHT ARM;  Surgeon: Rosetta Posner, MD;  Location: [place];  Service: Vascular;  Laterality: Right;   COLONOSCOPY N/A 10/28/2017   Procedure: COLONOSCOPY;  Surgeon: Rogene Houston, MD;  [facility];  Service: Endoscopy;  Laterality: N/A;   COLONOSCOPY WITH PROPOFOL N/A 12/29/2019   Procedure: COLONOSCOPY WITH PROPOFOL;  Surgeon: Eloise Harman, DO;  [facility];  Service: Endoscopy;  Laterality: N/A;   ESOPHAGOGASTRODUODENOSCOPY (EGD) WITH PROPOFOL N/A 10/31/2017   Procedure: ESOPHAGOGASTRODUODENOSCOPY (EGD) WITH PROPOFOL;  Surgeon: Rogene Houston, MD;  [facility];  Service: Endoscopy;  Laterality: N/A;   JOINT REPLACEMENT     PERIPHERAL VASCULAR BALLOON ANGIOPLASTY Left 04/06/2021   Procedure: PERIPHERAL VASCULAR BALLOON ANGIOPLASTY;  Surgeon: Broadus John, MD;  Location: [facility];  Service: Cardiovascular;  Laterality: Left;  left sfa succesful [place] AT Unsuccesful   TOTAL KNEE ARTHROPLASTY Right ~ 1986   TUMOR EXCISION  ~ 1970   "9# in my stomach"    Allergies  Allergies  Allergen Reactions   Codeine Nausea And Vomiting    History of Present Illness    Jody [name] is a 81 y.o. female with a hx of PAD, atrial fibrillation, chronic systolic and diastolic heart failure, NSVT,  ESRD on dialysis last seen 01/12/2021 by Dr. Percival Spanish.  Last seen 12/2020 doing overall well from a cardiac perspective.  She was noted to be taking Eliquis only intermittently and this was discussed at length and she was agreeable to continue Eliquis regularly.  Since last seen she has had multiple hospitalizations.  Hospitalized 02/2021 after multiple falls, missed session of dialysis.  She was discharged to SNF.  Hospitalized 2/20 - 04/15/2021 with acute osteomyelitis of the left foot treated with IV antibiotics.  She  underwent left first toe amputation as well as arteriogram with SFA drug-coated balloon angioplasty and unsuccessful recannulization of the ATA by vascular surgery.  Hospitalized 4/18/4/25/23 with acute hypercapnic respiratory failure and pneumonia.  Her current hospitalization 5/20 - 07/04/2021 after presenting with chest pain during dialysis.  She was thought to be volume overloaded.  Initial mildly elevated troponin but negative delta.  Repeat echo 06/03/2021 with septal hypokinesis consistent with volume/pressure overload, EF 50%, RV mildly reduced, RV mildly enlarged, moderately elevated PASP, bilateral atria severely dilated, mild MR, mild AI.  Presents today for follow-up with her niece.  She is presently residing at [facility] where she has been since January.  Participating in therapy.  She notes no chest pain, dyspnea, palpitations, lightheadedness, dizziness.  Continues to very gradually regain her strength.  Is overall somewhat frustrated with her slow progress.  She does note she sometimes feels unsafe and as if she might fall during bathing so we will ask PT at SNF to help with teaching.  She notes easy bruising and is taking Eliquis as well as aspirin.  She notes some congestion and productive cough that is difficult to get up.  No fever, chills.  EKGs/Labs/Other Studies Reviewed:   The following studies were reviewed today: Echo 06/03/2021   1. Septal hypokinesis. Septal flattening consistent with volume /  pressure overload. . Left ventricular ejection fraction, by estimation, is  50%%. The left ventricle has low normal function. There is mild left  ventricular hypertrophy. Left ventricular  diastolic parameters are indeterminate.   2. Right ventricular systolic function is mildly reduced. The right  ventricular size is mildly enlarged. There is moderately elevated  pulmonary artery systolic pressure.   3. Left atrial size was severely dilated.   4. Right atrial size was severely dilated.    5. Mild mitral valve regurgitation.   6. Tricuspid valve regurgitation is severe.   7. AV is thickened, calcified with very mildly restricted motion. Peak  and mean gradients through the valve are 14 and 8 mm Hg respectively . The  aortic valve is tricuspid. Aortic valve regurgitation is mild.   EKG: No EKG today  Recent Labs: 03/03/2021: B Natriuretic Peptide 1,411.0 06/02/2021: TSH 1.132 06/06/2021: ALT 109; Magnesium 2.6 07/03/2021: BUN 25; Creatinine, Ser 3.30; Hemoglobin 9.4; Platelets 80; Potassium 3.7; Sodium 139  Recent Lipid Panel    Component Value Date/Time   CHOL 101 04/07/2021 0222   TRIG 50 04/07/2021 0222   HDL 47 04/07/2021 0222   CHOLHDL 2.1 04/07/2021 0222   VLDL 10 04/07/2021 0222   LDLCALC 44 04/07/2021 0222    Risk Assessment/Calculations:   CHA2DS2-VASc Score = 5   This indicates a 7.2% annual risk of stroke. The patient's score is based upon: CHF History: 1 HTN History: 0 Diabetes History: 0 Stroke History: 0 Vascular Disease History: 1 Age Score: 2 Gender Score: 1    Home Medications   Current Meds  Medication Sig   acetaminophen (TYLENOL) 500 MG tablet  Take 1 tablet (500 mg total) by mouth 3 (three) times daily.   albuterol (ACCUNEB) 0.63 MG/3ML nebulizer solution Take 1 ampule by nebulization every 6 (six) hours as needed for wheezing. Patient taking 2.'5mg'$    albuterol (PROVENTIL HFA;VENTOLIN HFA) 108 (90 BASE) MCG/ACT inhaler Inhale 1-2 puffs into the lungs every 6 (six) hours as needed for wheezing or shortness of breath. Reported on 03/01/2015   apixaban (ELIQUIS) 2.5 MG TABS tablet Take 2.5 mg by mouth 2 (two) times daily.   atorvastatin (LIPITOR) 40 MG tablet Take 1 tablet by mouth at bedtime.   benzonatate (TESSALON) 200 MG capsule Take 200 mg by mouth 3 (three) times daily as needed for cough.   cyanocobalamin 1000 MCG tablet Take 1 tablet by mouth daily.   diltiazem (CARDIZEM CD) 120 MG 24 hr capsule Take 120 mg by mouth daily.   ferrous  sulfate 325 (65 FE) MG tablet Take 325 mg by mouth 2 (two) times daily with a meal.   folic acid (FOLVITE) 1 MG tablet Take 1 tablet (1 mg total) by mouth daily.   furosemide (LASIX) 40 MG tablet Take 40 mg by mouth daily.   gabapentin (NEURONTIN) 300 MG capsule Take 300 mg by mouth daily.   metoprolol tartrate (LOPRESSOR) 25 MG tablet Take 0.5 tablets (12.5 mg total) by mouth 2 (two) times daily.   Multiple Vitamins-Minerals (ONCOVITE) TABS Take 1 tablet by mouth at bedtime.   Nutritional Supplements (FEEDING SUPPLEMENT, NEPRO CARB STEADY,) LIQD Take 237 mLs by mouth 2 (two) times daily between meals.   sevelamer carbonate (RENVELA) 800 MG tablet Take 800 mg by mouth 3 (three) times daily with meals.    [DISCONTINUED] aspirin EC 81 MG tablet Take 1 tablet by mouth daily.     Review of Systems      All other systems reviewed and are otherwise negative except as noted above.  Physical Exam    VS:  BP 108/70   Pulse 72   Ht '5\' 7"'$  (1.702 m)   Wt 197 lb 8.5 oz (89.6 kg) Comment: taken at nursing facility, unable to stand and weigh today  BMI 30.94 kg/m  , BMI Body mass index is 30.94 kg/m.  Wt Readings from Last 3 Encounters:  07/18/21 197 lb 8.5 oz (89.6 kg)  07/02/21 178 lb 12.7 oz (81.1 kg)  06/07/21 178 lb 12.7 oz (81.1 kg)     GEN: Well nourished,  overweight, well developed, in no acute distress.In wheelchair at time of our exam. HEENT: normal. Neck: Supple, no JVD, carotid bruits, or masses. Cardiac: IRIR, no murmurs, rubs, or gallops. No clubbing, cyanosis, edema.  Radials/PT 2+ and equal bilaterally.  Respiratory:  Respirations regular and unlabored, clear to auscultation bilaterally.On 4L O2. GI: Soft, nontender, nondistended. MS: No deformity or atrophy. Skin: Warm and dry, no rash. Neuro:  Strength and sensation are intact. Psych: Normal affect.  Assessment & Plan    Chest pain - Recent hospital visit with chest pain in setting of volume overload. No recurrent  chest pain. No indication for ischemic evaluation. Continue to follow with nephrology and HD regarding volume management.   ESRD - Volume status manaed with HD. Follows with nephrology.   DM2 - Continue to follow with PCP.  PAD - Follows with vascular surgery. No new claudication symptoms.   HTN -  BP well controlled. Continue current antihypertensive regimen.    Chronic combined systolic and diastolic heart failure - Volume status difficult to ascertain with body habitus. Volume  management per HD and nephrology. Low sodium diet, fluid restriction <2L, and daily weights encouraged. Educated to contact our office for weight gain of 2 lbs overnight or 5 lbs in one week.   Persistent atrial fibrillation / Hypercoagulable state - Rate controlled today. Continue Diltiazem 148mQD, Metoprolol tartrate 12.'5mg'$  BID, Eliquis 2.'5mg'$  BID. Dose of Eliquis reduced due to age, renal function. Due to significant bruising, stop Aspirin and continue Eliquis.   HLD -Continue Atorvastatin.       Disposition: Follow up  November 2023  with JMinus Breeding MD or APP.  Signed, CLoel Dubonnet NP 07/18/2021, 6:58 PM [facility]

## 2021-07-18 NOTE — Patient Instructions (Addendum)
Medication Instructions:  Your physician has recommended you make the following change in your medication:   STOP Aspirin  CONTINUE Eliquis 2.'5mg'$  twice daily  START Guafenesin '200mg'$  TID PRN for congestion  *If you need a refill on your cardiac medications before your next appointment, please call your pharmacy*   Lab Work: None ordered today.   Testing/Procedures: None ordered today.    Follow-Up: At [facility], you and your health needs are our priority.  As part of our continuing mission to provide you with exceptional heart care, we have created designated Provider Care Teams.  These Care Teams include your primary Cardiologist (physician) and Advanced Practice Providers (APPs -  Physician Assistants and Nurse Practitioners) who all work together to provide you with the care you need, when you need it.  We recommend signing up for the patient portal called "MyChart".  Sign up information is provided on this After Visit Summary.  MyChart is used to connect with patients for Virtual Visits (Telemedicine).  Patients are able to view lab/test results, encounter notes, upcoming appointments, etc.  Non-urgent messages can be sent to your provider as well.   To learn more about what you can do with MyChart, go to [email or web address].    Your next appointment:   As scheduled in November with Dr. Percival Spanish in [place]   Other Instructions We have asked physical therapy at your rehab facility to work with the aides to help you gain strength and feel safe while bathing. We have asked the nursing facility to help you sit up during meals

## 2021-07-19 DIAGNOSIS — N2581 Secondary hyperparathyroidism of renal origin: Secondary | ICD-10-CM | POA: Diagnosis not present

## 2021-07-19 DIAGNOSIS — M6281 Muscle weakness (generalized): Secondary | ICD-10-CM | POA: Diagnosis not present

## 2021-07-19 DIAGNOSIS — N250 Renal osteodystrophy: Secondary | ICD-10-CM | POA: Diagnosis not present

## 2021-07-19 DIAGNOSIS — G9341 Metabolic encephalopathy: Secondary | ICD-10-CM | POA: Diagnosis not present

## 2021-07-19 DIAGNOSIS — N186 End stage renal disease: Secondary | ICD-10-CM | POA: Diagnosis not present

## 2021-07-19 DIAGNOSIS — Z992 Dependence on renal dialysis: Secondary | ICD-10-CM | POA: Diagnosis not present

## 2021-07-19 DIAGNOSIS — E559 Vitamin D deficiency, unspecified: Secondary | ICD-10-CM | POA: Diagnosis not present

## 2021-07-19 DIAGNOSIS — R2689 Other abnormalities of gait and mobility: Secondary | ICD-10-CM | POA: Diagnosis not present

## 2021-07-20 DIAGNOSIS — G9341 Metabolic encephalopathy: Secondary | ICD-10-CM | POA: Diagnosis not present

## 2021-07-20 DIAGNOSIS — R2689 Other abnormalities of gait and mobility: Secondary | ICD-10-CM | POA: Diagnosis not present

## 2021-07-20 DIAGNOSIS — M6281 Muscle weakness (generalized): Secondary | ICD-10-CM | POA: Diagnosis not present

## 2021-07-21 DIAGNOSIS — Z992 Dependence on renal dialysis: Secondary | ICD-10-CM | POA: Diagnosis not present

## 2021-07-21 DIAGNOSIS — N2581 Secondary hyperparathyroidism of renal origin: Secondary | ICD-10-CM | POA: Diagnosis not present

## 2021-07-21 DIAGNOSIS — M6281 Muscle weakness (generalized): Secondary | ICD-10-CM | POA: Diagnosis not present

## 2021-07-21 DIAGNOSIS — G9341 Metabolic encephalopathy: Secondary | ICD-10-CM | POA: Diagnosis not present

## 2021-07-21 DIAGNOSIS — N186 End stage renal disease: Secondary | ICD-10-CM | POA: Diagnosis not present

## 2021-07-21 DIAGNOSIS — E559 Vitamin D deficiency, unspecified: Secondary | ICD-10-CM | POA: Diagnosis not present

## 2021-07-21 DIAGNOSIS — N250 Renal osteodystrophy: Secondary | ICD-10-CM | POA: Diagnosis not present

## 2021-07-21 DIAGNOSIS — R2689 Other abnormalities of gait and mobility: Secondary | ICD-10-CM | POA: Diagnosis not present

## 2021-07-22 DIAGNOSIS — R2689 Other abnormalities of gait and mobility: Secondary | ICD-10-CM | POA: Diagnosis not present

## 2021-07-22 DIAGNOSIS — M6281 Muscle weakness (generalized): Secondary | ICD-10-CM | POA: Diagnosis not present

## 2021-07-22 DIAGNOSIS — G9341 Metabolic encephalopathy: Secondary | ICD-10-CM | POA: Diagnosis not present

## 2021-07-23 DIAGNOSIS — N2581 Secondary hyperparathyroidism of renal origin: Secondary | ICD-10-CM | POA: Diagnosis not present

## 2021-07-23 DIAGNOSIS — Z992 Dependence on renal dialysis: Secondary | ICD-10-CM | POA: Diagnosis not present

## 2021-07-23 DIAGNOSIS — E559 Vitamin D deficiency, unspecified: Secondary | ICD-10-CM | POA: Diagnosis not present

## 2021-07-23 DIAGNOSIS — N250 Renal osteodystrophy: Secondary | ICD-10-CM | POA: Diagnosis not present

## 2021-07-23 DIAGNOSIS — N186 End stage renal disease: Secondary | ICD-10-CM | POA: Diagnosis not present

## 2021-07-25 ENCOUNTER — Other Ambulatory Visit: Payer: Self-pay

## 2021-07-25 ENCOUNTER — Encounter (HOSPITAL_COMMUNITY): Payer: Self-pay

## 2021-07-25 ENCOUNTER — Emergency Department (HOSPITAL_COMMUNITY): Payer: Medicare Other

## 2021-07-25 ENCOUNTER — Inpatient Hospital Stay (HOSPITAL_COMMUNITY)
Admission: EM | Admit: 2021-07-25 | Discharge: 2021-07-31 | DRG: 190 | Disposition: A | Discharge status: Skilled Nursing Facility | Payer: Medicare Other | Source: Skilled Nursing Facility | Attending: Family Medicine | Admitting: Family Medicine

## 2021-07-25 DIAGNOSIS — I5042 Chronic combined systolic (congestive) and diastolic (congestive) heart failure: Secondary | ICD-10-CM | POA: Diagnosis not present

## 2021-07-25 DIAGNOSIS — J9621 Acute and chronic respiratory failure with hypoxia: Secondary | ICD-10-CM | POA: Diagnosis not present

## 2021-07-25 DIAGNOSIS — E7800 Pure hypercholesterolemia, unspecified: Secondary | ICD-10-CM | POA: Diagnosis present

## 2021-07-25 DIAGNOSIS — R0602 Shortness of breath: Secondary | ICD-10-CM | POA: Diagnosis not present

## 2021-07-25 DIAGNOSIS — M6281 Muscle weakness (generalized): Secondary | ICD-10-CM | POA: Diagnosis not present

## 2021-07-25 DIAGNOSIS — J9601 Acute respiratory failure with hypoxia: Secondary | ICD-10-CM | POA: Diagnosis not present

## 2021-07-25 DIAGNOSIS — I509 Heart failure, unspecified: Secondary | ICD-10-CM

## 2021-07-25 DIAGNOSIS — N186 End stage renal disease: Secondary | ICD-10-CM

## 2021-07-25 DIAGNOSIS — I5033 Acute on chronic diastolic (congestive) heart failure: Secondary | ICD-10-CM | POA: Diagnosis present

## 2021-07-25 DIAGNOSIS — R2689 Other abnormalities of gait and mobility: Secondary | ICD-10-CM | POA: Diagnosis not present

## 2021-07-25 DIAGNOSIS — Z8249 Family history of ischemic heart disease and other diseases of the circulatory system: Secondary | ICD-10-CM | POA: Diagnosis not present

## 2021-07-25 DIAGNOSIS — R6889 Other general symptoms and signs: Secondary | ICD-10-CM | POA: Diagnosis not present

## 2021-07-25 DIAGNOSIS — N2581 Secondary hyperparathyroidism of renal origin: Secondary | ICD-10-CM | POA: Diagnosis present

## 2021-07-25 DIAGNOSIS — I4811 Longstanding persistent atrial fibrillation: Secondary | ICD-10-CM | POA: Diagnosis not present

## 2021-07-25 DIAGNOSIS — E66811 Obesity, class 1: Secondary | ICD-10-CM

## 2021-07-25 DIAGNOSIS — G9341 Metabolic encephalopathy: Secondary | ICD-10-CM | POA: Diagnosis not present

## 2021-07-25 DIAGNOSIS — Z96651 Presence of right artificial knee joint: Secondary | ICD-10-CM | POA: Diagnosis present

## 2021-07-25 DIAGNOSIS — I5043 Acute on chronic combined systolic (congestive) and diastolic (congestive) heart failure: Secondary | ICD-10-CM | POA: Diagnosis present

## 2021-07-25 DIAGNOSIS — I4819 Other persistent atrial fibrillation: Secondary | ICD-10-CM | POA: Diagnosis not present

## 2021-07-25 DIAGNOSIS — Z79899 Other long term (current) drug therapy: Secondary | ICD-10-CM

## 2021-07-25 DIAGNOSIS — Z7901 Long term (current) use of anticoagulants: Secondary | ICD-10-CM | POA: Diagnosis not present

## 2021-07-25 DIAGNOSIS — I5023 Acute on chronic systolic (congestive) heart failure: Secondary | ICD-10-CM | POA: Diagnosis not present

## 2021-07-25 DIAGNOSIS — E871 Hypo-osmolality and hyponatremia: Secondary | ICD-10-CM | POA: Diagnosis not present

## 2021-07-25 DIAGNOSIS — R0689 Other abnormalities of breathing: Secondary | ICD-10-CM | POA: Diagnosis not present

## 2021-07-25 DIAGNOSIS — Z7401 Bed confinement status: Secondary | ICD-10-CM | POA: Diagnosis not present

## 2021-07-25 DIAGNOSIS — Z811 Family history of alcohol abuse and dependence: Secondary | ICD-10-CM | POA: Diagnosis not present

## 2021-07-25 DIAGNOSIS — J811 Chronic pulmonary edema: Secondary | ICD-10-CM | POA: Diagnosis not present

## 2021-07-25 DIAGNOSIS — Z992 Dependence on renal dialysis: Secondary | ICD-10-CM | POA: Diagnosis not present

## 2021-07-25 DIAGNOSIS — I5032 Chronic diastolic (congestive) heart failure: Secondary | ICD-10-CM | POA: Diagnosis not present

## 2021-07-25 DIAGNOSIS — Z803 Family history of malignant neoplasm of breast: Secondary | ICD-10-CM

## 2021-07-25 DIAGNOSIS — Z6831 Body mass index (BMI) 31.0-31.9, adult: Secondary | ICD-10-CM

## 2021-07-25 DIAGNOSIS — M549 Dorsalgia, unspecified: Secondary | ICD-10-CM | POA: Diagnosis not present

## 2021-07-25 DIAGNOSIS — D631 Anemia in chronic kidney disease: Secondary | ICD-10-CM | POA: Diagnosis not present

## 2021-07-25 DIAGNOSIS — D696 Thrombocytopenia, unspecified: Secondary | ICD-10-CM | POA: Diagnosis not present

## 2021-07-25 DIAGNOSIS — N250 Renal osteodystrophy: Secondary | ICD-10-CM | POA: Diagnosis not present

## 2021-07-25 DIAGNOSIS — R0902 Hypoxemia: Secondary | ICD-10-CM | POA: Diagnosis not present

## 2021-07-25 DIAGNOSIS — E1122 Type 2 diabetes mellitus with diabetic chronic kidney disease: Secondary | ICD-10-CM | POA: Diagnosis not present

## 2021-07-25 DIAGNOSIS — I110 Hypertensive heart disease with heart failure: Secondary | ICD-10-CM | POA: Diagnosis not present

## 2021-07-25 DIAGNOSIS — M898X9 Other specified disorders of bone, unspecified site: Secondary | ICD-10-CM | POA: Diagnosis present

## 2021-07-25 DIAGNOSIS — Z87891 Personal history of nicotine dependence: Secondary | ICD-10-CM

## 2021-07-25 DIAGNOSIS — G8929 Other chronic pain: Secondary | ICD-10-CM | POA: Diagnosis not present

## 2021-07-25 DIAGNOSIS — E1121 Type 2 diabetes mellitus with diabetic nephropathy: Secondary | ICD-10-CM | POA: Diagnosis present

## 2021-07-25 DIAGNOSIS — J441 Chronic obstructive pulmonary disease with (acute) exacerbation: Secondary | ICD-10-CM | POA: Diagnosis not present

## 2021-07-25 DIAGNOSIS — Z743 Need for continuous supervision: Secondary | ICD-10-CM | POA: Diagnosis not present

## 2021-07-25 DIAGNOSIS — J449 Chronic obstructive pulmonary disease, unspecified: Secondary | ICD-10-CM | POA: Diagnosis present

## 2021-07-25 DIAGNOSIS — I120 Hypertensive chronic kidney disease with stage 5 chronic kidney disease or end stage renal disease: Secondary | ICD-10-CM | POA: Diagnosis not present

## 2021-07-25 DIAGNOSIS — E669 Obesity, unspecified: Secondary | ICD-10-CM | POA: Diagnosis present

## 2021-07-25 DIAGNOSIS — Z20822 Contact with and (suspected) exposure to covid-19: Secondary | ICD-10-CM | POA: Diagnosis not present

## 2021-07-25 DIAGNOSIS — R062 Wheezing: Secondary | ICD-10-CM | POA: Diagnosis not present

## 2021-07-25 DIAGNOSIS — I132 Hypertensive heart and chronic kidney disease with heart failure and with stage 5 chronic kidney disease, or end stage renal disease: Secondary | ICD-10-CM | POA: Diagnosis not present

## 2021-07-25 DIAGNOSIS — J9611 Chronic respiratory failure with hypoxia: Secondary | ICD-10-CM | POA: Diagnosis not present

## 2021-07-25 DIAGNOSIS — Z885 Allergy status to narcotic agent status: Secondary | ICD-10-CM

## 2021-07-25 DIAGNOSIS — I10 Essential (primary) hypertension: Secondary | ICD-10-CM | POA: Diagnosis present

## 2021-07-25 DIAGNOSIS — R0600 Dyspnea, unspecified: Secondary | ICD-10-CM

## 2021-07-25 DIAGNOSIS — J9610 Chronic respiratory failure, unspecified whether with hypoxia or hypercapnia: Secondary | ICD-10-CM | POA: Diagnosis present

## 2021-07-25 DIAGNOSIS — R5381 Other malaise: Secondary | ICD-10-CM | POA: Diagnosis not present

## 2021-07-25 LAB — CBC WITH DIFFERENTIAL/PLATELET
Abs Immature Granulocytes: 0.02 10*3/uL (ref 0.00–0.07)
Basophils Absolute: 0 10*3/uL (ref 0.0–0.1)
Basophils Relative: 0 %
Eosinophils Absolute: 0.1 10*3/uL (ref 0.0–0.5)
Eosinophils Relative: 2 %
HCT: 32.2 % — ABNORMAL LOW (ref 36.0–46.0)
Hemoglobin: 9.4 g/dL — ABNORMAL LOW (ref 12.0–15.0)
Immature Granulocytes: 0 %
Lymphocytes Relative: 14 %
Lymphs Abs: 0.8 10*3/uL (ref 0.7–4.0)
MCH: 32.6 pg (ref 26.0–34.0)
MCHC: 29.2 g/dL — ABNORMAL LOW (ref 30.0–36.0)
MCV: 111.8 fL — ABNORMAL HIGH (ref 80.0–100.0)
Monocytes Absolute: 0.4 10*3/uL (ref 0.1–1.0)
Monocytes Relative: 7 %
Neutro Abs: 4.3 10*3/uL (ref 1.7–7.7)
Neutrophils Relative %: 77 %
Platelets: 67 10*3/uL — ABNORMAL LOW (ref 150–400)
RBC: 2.88 MIL/uL — ABNORMAL LOW (ref 3.87–5.11)
RDW: 17.2 % — ABNORMAL HIGH (ref 11.5–15.5)
WBC: 5.6 10*3/uL (ref 4.0–10.5)
nRBC: 0.4 % — ABNORMAL HIGH (ref 0.0–0.2)

## 2021-07-25 LAB — BASIC METABOLIC PANEL
Anion gap: 8 (ref 5–15)
BUN: 41 mg/dL — ABNORMAL HIGH (ref 8–23)
CO2: 27 mmol/L (ref 22–32)
Calcium: 10.3 mg/dL (ref 8.9–10.3)
Chloride: 102 mmol/L (ref 98–111)
Creatinine, Ser: 4.4 mg/dL — ABNORMAL HIGH (ref 0.44–1.00)
GFR, Estimated: 9 mL/min — ABNORMAL LOW (ref >60)
Glucose, Bld: 133 mg/dL — ABNORMAL HIGH (ref 70–99)
Potassium: 4.3 mmol/L (ref 3.5–5.1)
Sodium: 137 mmol/L (ref 135–145)

## 2021-07-25 LAB — RESP PANEL BY RT-PCR (FLU A&B, COVID) ARPGX2
Influenza A by PCR: NEGATIVE
Influenza B by PCR: NEGATIVE
SARS Coronavirus 2 by RT PCR: NEGATIVE

## 2021-07-25 LAB — BRAIN NATRIURETIC PEPTIDE: B Natriuretic Peptide: 1783 pg/mL — ABNORMAL HIGH (ref 0.0–100.0)

## 2021-07-25 LAB — MAGNESIUM: Magnesium: 3 mg/dL — ABNORMAL HIGH (ref 1.7–2.4)

## 2021-07-25 LAB — TROPONIN I (HIGH SENSITIVITY)
Troponin I (High Sensitivity): 34 ng/L — ABNORMAL HIGH (ref <18)
Troponin I (High Sensitivity): 35 ng/L — ABNORMAL HIGH (ref <18)

## 2021-07-25 LAB — GLUCOSE, CAPILLARY: Glucose-Capillary: 162 mg/dL — ABNORMAL HIGH (ref 70–99)

## 2021-07-25 MED ORDER — INSULIN ASPART 100 UNIT/ML IJ SOLN
0.0000 [IU] | Freq: Every day | INTRAMUSCULAR | Status: DC
  Administered 2021-07-29 – 2021-07-30 (×2): 2 [IU] via SUBCUTANEOUS

## 2021-07-25 MED ORDER — INSULIN ASPART 100 UNIT/ML IJ SOLN
0.0000 [IU] | Freq: Three times a day (TID) | INTRAMUSCULAR | Status: DC
  Administered 2021-07-26 – 2021-07-27 (×4): 1 [IU] via SUBCUTANEOUS
  Administered 2021-07-28: 2 [IU] via SUBCUTANEOUS
  Administered 2021-07-28 – 2021-07-29 (×2): 1 [IU] via SUBCUTANEOUS
  Administered 2021-07-30 (×2): 2 [IU] via SUBCUTANEOUS
  Administered 2021-07-31: 1 [IU] via SUBCUTANEOUS

## 2021-07-25 MED ORDER — ACETAMINOPHEN 325 MG PO TABS
650.0000 mg | ORAL_TABLET | Freq: Four times a day (QID) | ORAL | Status: DC | PRN
  Administered 2021-07-25 – 2021-07-29 (×2): 650 mg via ORAL
  Filled 2021-07-25 (×2): qty 2

## 2021-07-25 MED ORDER — IPRATROPIUM-ALBUTEROL 0.5-2.5 (3) MG/3ML IN SOLN
3.0000 mL | Freq: Three times a day (TID) | RESPIRATORY_TRACT | Status: DC
  Administered 2021-07-25 – 2021-07-26 (×3): 3 mL via RESPIRATORY_TRACT
  Filled 2021-07-25 (×3): qty 3

## 2021-07-25 MED ORDER — METOPROLOL TARTRATE 25 MG PO TABS
12.5000 mg | ORAL_TABLET | Freq: Two times a day (BID) | ORAL | Status: DC
  Administered 2021-07-25 – 2021-07-28 (×5): 12.5 mg via ORAL
  Filled 2021-07-25 (×6): qty 1

## 2021-07-25 MED ORDER — IPRATROPIUM-ALBUTEROL 0.5-2.5 (3) MG/3ML IN SOLN: 3.0000 mL | RESPIRATORY_TRACT | Status: DC | PRN

## 2021-07-25 MED ORDER — ATORVASTATIN CALCIUM 40 MG PO TABS
40.0000 mg | ORAL_TABLET | Freq: Every day | ORAL | Status: DC
  Administered 2021-07-26 – 2021-07-30 (×5): 40 mg via ORAL
  Filled 2021-07-25 (×6): qty 1

## 2021-07-25 MED ORDER — ACETAMINOPHEN 650 MG RE SUPP: 650.0000 mg | Freq: Four times a day (QID) | RECTAL | Status: DC | PRN

## 2021-07-25 MED ORDER — SEVELAMER CARBONATE 800 MG PO TABS
800.0000 mg | ORAL_TABLET | Freq: Three times a day (TID) | ORAL | Status: DC
  Administered 2021-07-26 – 2021-07-31 (×11): 800 mg via ORAL
  Filled 2021-07-25 (×11): qty 1

## 2021-07-25 MED ORDER — GABAPENTIN 300 MG PO CAPS
300.0000 mg | ORAL_CAPSULE | Freq: Every day | ORAL | Status: DC
  Administered 2021-07-26 – 2021-07-31 (×6): 300 mg via ORAL
  Filled 2021-07-25 (×6): qty 1

## 2021-07-25 MED ORDER — ALBUTEROL SULFATE HFA 108 (90 BASE) MCG/ACT IN AERS: 2.0000 | INHALATION_SPRAY | RESPIRATORY_TRACT | Status: DC | PRN

## 2021-07-25 MED ORDER — METHYLPREDNISOLONE SODIUM SUCC 125 MG IJ SOLR
60.0000 mg | Freq: Two times a day (BID) | INTRAMUSCULAR | Status: DC
  Administered 2021-07-26: 60 mg via INTRAVENOUS
  Filled 2021-07-25: qty 2

## 2021-07-25 MED ORDER — ALBUTEROL SULFATE (2.5 MG/3ML) 0.083% IN NEBU
2.5000 mg | INHALATION_SOLUTION | Freq: Once | RESPIRATORY_TRACT | Status: AC
Start: 2021-07-25 — End: 2021-07-25
  Administered 2021-07-25: 2.5 mg via RESPIRATORY_TRACT
  Filled 2021-07-25: qty 3

## 2021-07-25 MED ORDER — PREDNISONE 20 MG PO TABS: 40.0000 mg | ORAL_TABLET | Freq: Every day | ORAL | Status: DC

## 2021-07-25 MED ORDER — DOXYCYCLINE HYCLATE 100 MG PO TABS
100.0000 mg | ORAL_TABLET | Freq: Two times a day (BID) | ORAL | Status: AC
  Administered 2021-07-25 – 2021-07-30 (×10): 100 mg via ORAL
  Filled 2021-07-25 (×10): qty 1

## 2021-07-25 MED ORDER — METHYLPREDNISOLONE SODIUM SUCC 125 MG IJ SOLR
80.0000 mg | Freq: Once | INTRAMUSCULAR | Status: AC
Start: 2021-07-25 — End: 2021-07-25
  Administered 2021-07-25: 80 mg via INTRAVENOUS
  Filled 2021-07-25: qty 2

## 2021-07-25 MED ORDER — POLYETHYLENE GLYCOL 3350 17 G PO PACK: 17.0000 g | PACK | Freq: Every day | ORAL | Status: DC | PRN

## 2021-07-25 MED ORDER — DILTIAZEM HCL ER COATED BEADS 120 MG PO CP24
120.0000 mg | ORAL_CAPSULE | Freq: Every day | ORAL | Status: DC
  Administered 2021-07-27 – 2021-07-28 (×2): 120 mg via ORAL
  Filled 2021-07-25 (×2): qty 1

## 2021-07-25 MED ORDER — APIXABAN 2.5 MG PO TABS
2.5000 mg | ORAL_TABLET | Freq: Two times a day (BID) | ORAL | Status: DC
Start: 2021-07-25 — End: 2021-07-25

## 2021-07-25 MED ORDER — FUROSEMIDE 10 MG/ML IJ SOLN
40.0000 mg | Freq: Once | INTRAMUSCULAR | Status: AC
  Administered 2021-07-25: 40 mg via INTRAVENOUS
  Filled 2021-07-25: qty 4

## 2021-07-25 MED ORDER — IPRATROPIUM-ALBUTEROL 0.5-2.5 (3) MG/3ML IN SOLN
3.0000 mL | Freq: Once | RESPIRATORY_TRACT | Status: AC
Start: 2021-07-25 — End: 2021-07-25
  Administered 2021-07-25: 3 mL via RESPIRATORY_TRACT
  Filled 2021-07-25: qty 3

## 2021-07-25 NOTE — Assessment & Plan Note (Addendum)
-  chronically on HD. -Not on medications for diabetes currently; following diet control only -will continue SSI while inpatient and follow CBG's fluctuation.

## 2021-07-25 NOTE — Assessment & Plan Note (Addendum)
-  On 2 to 3 L at baseline. -at time of admission required transient use of 4L and now on 3.5L through [place]. -continue to wean down to baseline as tolerated.

## 2021-07-25 NOTE — Assessment & Plan Note (Addendum)
-  Platelets down to 67K.   -no overt bleeding -holding eliquis currently -follow platelets count after volume stabilization -if needed discuss with Hematology service. -receiving steroids as part of tx for COPD exacerbation. -SCD's for DVT prophylaxis initiated. -per chart review patient with mild chronic thrombocytopenia with platelets count in 120-130: range before.

## 2021-07-25 NOTE — Progress Notes (Signed)
   07/25/21 2329  Assess: MEWS Score  Temp 98 F (36.7 C)  BP (!) 141/78  Pulse Rate (!) 104  Resp (!) 22  Level of Consciousness Alert  SpO2 97 %  O2 Device Nasal Cannula  O2 Flow Rate (L/min) 3.5 L/min  Assess: MEWS Score  MEWS Temp 0  MEWS Systolic 0  MEWS Pulse 1  MEWS RR 1  MEWS LOC 0  MEWS Score 2  MEWS Score Color Yellow  Assess: if the MEWS score is Yellow or Red  Were vital signs taken at a resting state? Yes  Focused Assessment No change from prior assessment  Does the patient meet 2 or more of the SIRS criteria? No  MEWS guidelines implemented *See Row Information* Yes  Treat  MEWS Interventions Administered scheduled meds/treatments (given metoprolol)  Pain Scale 0-10  Pain Score 4  Pain Type Chronic pain  Pain Location Foot  Pain Orientation Left  Pain Descriptors / Indicators Aching  Pain Frequency Intermittent  Pain Onset Progressive  Patients Stated Pain Goal 0  Pain Intervention(s) Medication (See eMAR)  Complains of Shortness of breath;Anxiety  Neuro symptoms relieved by Rest  Patients response to intervention Decreased  Early Detection of Sepsis Score *See Row Information* Medium  Take Vital Signs  Increase Vital Sign Frequency  Yellow: Q 2hr X 2 then Q 4hr X 2, if remains yellow, continue Q 4hrs  Escalate  MEWS: Escalate Yellow: discuss with charge nurse/RN and consider discussing with provider and RRT  Notify: Charge Nurse/RN  Name of Charge Nurse/RN Notified [place], RN  Date Charge Nurse/RN Notified 07/25/21  Time Charge Nurse/RN Notified [id number]  Document  Patient Outcome Other (Comment) (will continue to monitor for effectiveness of interventions)  Progress note created (see row info) Yes  Assess: SIRS CRITERIA  SIRS Temperature  0  SIRS Pulse 1  SIRS Respirations  1  SIRS WBC 0  SIRS Score Sum  2

## 2021-07-25 NOTE — ED Notes (Signed)
RT in room to admin neb at this time.

## 2021-07-25 NOTE — H&P (Addendum)
History and Physical    Jenice Leiner JME:[id number] DOB: 07/15/1940 DOA: 07/25/2021  PCP: Monico Blitz, MD   Patient coming from: Home  I have personally briefly reviewed patient's old medical records in [place]  Chief Complaint: Difficulty breathing difficulty breathing  HPI: Jody [name] is a 81 y.o. female with medical history significant for atrial fibrillation, ESRD, systolic and diastolic CHF, hypertension, diabetes mellitus, COPD with chronic respiratory failure. Patient presented to the ED with complaints of difficulty breathing that started today.  She denies cough.  No chest pain.  No lower extremity swelling.  He is on 2  - 3 L of oxygen at baseline.  Recent hospitalization 5/20 through 5/22 for chest pains, troponins EKG unremarkable.  Dialyzed during hospitalization and discharged.  ED Course: O2 sats intermittently dropping to 89% on 4 L, Tmax 99.2.  Heart rate mostly 60s to 108.  Respiratory rate 19-24.  Blood pressure systolic 229-798. BNP 1783.  Chest x-ray showing vascular congestion without pulmonary edema.  Troponin 34 > 35. Solu-Medrol '80mg'$  given.  DuoNebs given.  Hospitalist to admit for COPD exacerbation.  Review of Systems: As per HPI all other systems reviewed and negative.  Past Medical History:  Diagnosis Date   Anemia in chronic kidney disease (CODE) 06/05/2015   Arthritis    "bad in my legs" (12/07/2014)   Chronic atrial fibrillation ([place]) 06/05/2015   Chronic back pain    "dr said my spine is crooked"   Chronic bronchitis ([place])    "get it q yr" (12/07/2014)   CKD stage 4 due to type 2 diabetes mellitus ([place]) 11/04/1939   Complication of anesthesia    headache for 2 days after surgery in May 2019   COPD (chronic obstructive pulmonary disease) ([place])    Gait difficulty 09/02/2014   Hypercholesterolemia    Hypertension    Neuropathy 2015   in legs   NSVT (nonsustained ventricular tachycardia) ([place])    Sinus brady-tachy syndrome ([place])     Stroke ([place]) 05/2014   denies residual on 12/07/2014   Type II diabetes mellitus [facility])     Past Surgical History:  Procedure Laterality Date   ABDOMINAL AORTOGRAM W/LOWER EXTREMITY Left 04/06/2021   Procedure: ABDOMINAL AORTOGRAM W/LOWER EXTREMITY;  Surgeon: Broadus John, MD;  Location: [facility];  Service: Cardiovascular;  Laterality: Left;   ABDOMINAL HYSTERECTOMY  ~ 1970   AMPUTATION TOE Left 04/08/2021   Procedure: FIRST TOE LEFT FOOT AMPUTATION;  Surgeon: Trula Slade, DPM;  Location: [facility];  Service: Podiatry;  Laterality: Left;   APPENDECTOMY  ~ [place] Right 07/06/2017   Procedure: RIGHT RADIOCEPHALIC ARTERIOVENOUS FISTULA creation;  Surgeon: Conrad , MD;  Location: [facility];  Service: Vascular;  Laterality: Right;   [place] Right 09/17/2017   Procedure: SECOND STAGE BASILIC VEIN TRANSPOSITION RIGHT ARM;  Surgeon: Rosetta Posner, MD;  Location: [place];  Service: Vascular;  Laterality: Right;   COLONOSCOPY N/A 10/28/2017   Procedure: COLONOSCOPY;  Surgeon: Rogene Houston, MD;  [facility];  Service: Endoscopy;  Laterality: N/A;   COLONOSCOPY WITH PROPOFOL N/A 12/29/2019   Procedure: COLONOSCOPY WITH PROPOFOL;  Surgeon: Eloise Harman, DO;  [facility];  Service: Endoscopy;  Laterality: N/A;   ESOPHAGOGASTRODUODENOSCOPY (EGD) WITH PROPOFOL N/A 10/31/2017   Procedure: ESOPHAGOGASTRODUODENOSCOPY (EGD) WITH PROPOFOL;  Surgeon: Rogene Houston, MD;  [facility];  Service: Endoscopy;  Laterality: N/A;   JOINT  REPLACEMENT     PERIPHERAL VASCULAR BALLOON ANGIOPLASTY Left 04/06/2021   Procedure: PERIPHERAL VASCULAR BALLOON ANGIOPLASTY;  Surgeon: Broadus John, MD;  Location: [facility];  Service: Cardiovascular;  Laterality: Left;  left sfa succesful [place] AT Unsuccesful   TOTAL KNEE ARTHROPLASTY Right ~ [place]  ~ 1970   "9# in my stomach"     reports that she quit  smoking about 17 years ago. Her smoking use included cigarettes. She started smoking about 68 years ago. She has a 25.00 pack-year smoking history. She has never used smokeless tobacco. She reports that she does not drink alcohol and does not use drugs.  Allergies  Allergen Reactions   Codeine Nausea And Vomiting    Family History  Problem Relation Age of Onset   Cancer Other    Heart attack Brother    Cancer Sister        breast   Alcohol abuse Brother     Prior to Admission medications   Medication Sig Start Date End Date Taking? Authorizing Provider  acetaminophen (TYLENOL) 500 MG tablet Take 1 tablet (500 mg total) by mouth 3 (three) times daily. 04/13/21  Yes Regalado, Belkys A, MD  albuterol (ACCUNEB) 0.63 MG/3ML nebulizer solution Take 1 ampule by nebulization every 6 (six) hours as needed for wheezing. Patient taking 2.'5mg'$    Yes [provider]  albuterol (PROVENTIL HFA;VENTOLIN HFA) 108 (90 BASE) MCG/ACT inhaler Inhale 1-2 puffs into the lungs every 6 (six) hours as needed for wheezing or shortness of breath. Reported on 03/01/2015   Yes [provider]  apixaban (ELIQUIS) 2.5 MG TABS tablet Take 2.5 mg by mouth 2 (two) times daily.   Yes [provider]  atorvastatin (LIPITOR) 40 MG tablet Take 1 tablet by mouth at bedtime. 01/22/16  Yes [provider]  cyanocobalamin 1000 MCG tablet Take 1 tablet by mouth daily.   Yes [provider]  diltiazem (CARDIZEM CD) 120 MG 24 hr capsule Take 120 mg by mouth daily. 05/12/21  Yes [provider]  ferrous sulfate 325 (65 FE) MG tablet Take 325 mg by mouth 2 (two) times daily with a meal.   Yes [provider]  folic acid (FOLVITE) 1 MG tablet Take 1 tablet (1 mg total) by mouth daily. 06/08/21  Yes Tat, Shanon Brow, MD  furosemide (LASIX) 40 MG tablet Take 40 mg by mouth daily. 05/12/21  Yes [provider]  gabapentin (NEURONTIN) 300 MG capsule Take 300 mg by mouth daily.  05/11/21  Yes [provider]  metoprolol tartrate (LOPRESSOR) 25 MG tablet Take 0.5 tablets (12.5 mg total) by mouth 2 (two) times daily. 10/18/16  Yes Kathie Dike, MD  Multiple Vitamins-Minerals (ONCOVITE) TABS Take 1 tablet by mouth at bedtime.   Yes [provider]  Nutritional Supplements (FEEDING SUPPLEMENT, NEPRO CARB STEADY,) LIQD Take 237 mLs by mouth 2 (two) times daily between meals. 06/07/21  Yes Tat, Shanon Brow, MD  sevelamer carbonate (RENVELA) 800 MG tablet Take 800 mg by mouth 3 (three) times daily with meals.  07/22/19  Yes [provider]    Physical Exam: Vitals:   07/25/21 1800 07/25/21 1830 07/25/21 1835 07/25/21 1920  BP: (!) 140/109 (!) 110/45  130/63  Pulse:  84  97  Resp: (!) 24 19  (!) 24  Temp:    99.2 F (37.3 C)  TempSrc:    Oral  SpO2: 98% (!) 89% 92% 97%  Weight:  Height:        Constitutional: NAD, calm, comfortable Vitals:   07/25/21 1800 07/25/21 1830 07/25/21 1835 07/25/21 1920  BP: (!) 140/109 (!) 110/45  130/63  Pulse:  84  97  Resp: (!) 24 19  (!) 24  Temp:    99.2 F (37.3 C)  TempSrc:    Oral  SpO2: 98% (!) 89% 92% 97%  Weight:      Height:       Eyes: PERRL, lids and conjunctivae normal ENMT: Mucous membranes are dry Neck: normal, supple, no masses, no thyromegaly Respiratory: Faint and sparse expiratory wheezing, increased work of breathing when patient is making long sentences, but patient tells me that this has been ongoing for a long time.    Cardiovascular: Regular rate and rhythm, no murmurs / rubs / gallops. No extremity edema.  Lower extremities warm Abdomen: no tenderness, no masses palpated. No hepatosplenomegaly. Bowel sounds positive.  Musculoskeletal: no clubbing / cyanosis. No joint deformity upper and lower extremities.  Skin: no rashes, lesions, ulcers. No induration Neurologic:  No apparent cranial nerve abnormality, moving extremities spontaneously. Psychiatric: Normal judgment and insight.  Alert and oriented x 3. Normal mood.   Labs on Admission: I have personally reviewed following labs and imaging studies  CBC: Recent Labs  Lab 07/25/21 1552  WBC 5.6  NEUTROABS 4.3  HGB 9.4*  HCT 32.2*  MCV 111.8*  PLT 67*   Basic Metabolic Panel: Recent Labs  Lab 07/25/21 1552  NA 137  K 4.3  CL 102  CO2 27  GLUCOSE 133*  BUN 41*  CREATININE 4.40*  CALCIUM 10.3    Radiological Exams on Admission: DG Chest Port 1 View  Result Date: 07/25/2021 CLINICAL DATA:  Provided history: Shortness of breath since this morning. History of COPD, former smoker. EXAM: PORTABLE CHEST 1 VIEW COMPARISON:  Prior chest radiographs 07/03/2021 and earlier. FINDINGS: Shallow inspiration radiograph. Cardiomegaly. Aortic atherosclerosis. Central pulmonary vascular congestion without overt pulmonary edema. No definite airspace consolidation. No sizable pleural effusion or evidence of pneumothorax. No acute bony abnormality identified. Degenerative changes of the spine. IMPRESSION: Shallow inspiration radiograph. Cardiomegaly with central pulmonary vascular congestion. No overt pulmonary edema. Aortic Atherosclerosis (ICD10-I70.0). Electronically Signed   By: Kellie Simmering D.O.   On: 07/25/2021 15:48    EKG: Independently reviewed.  Atrial fibrillation rate 79, QTc prolonged 514.  Assessment/Plan Principal Problem:   COPD with acute exacerbation (HCC) Active Problems:   Persistent atrial fibrillation (HCC)   Chronic combined systolic and diastolic CHF (congestive heart failure) (HCC)   ESRD on dialysis (HCC)   Thrombocytopenia (HCC)   Essential hypertension   Type 2 diabetes mellitus with nephropathy (HCC)   Chronic respiratory failure (HCC)    Assessment and Plan: * COPD with acute exacerbation (HCC) Dyspnea likely secondary to COPD exacerbation and vascular congestion.  O2 sats initially ranging from 89 to 98% on 4 L, she is currently on 3.5 L and sats > 95%. -Solu-Medrol 80 mg x 1 given,  continue 60 twice daily  - DuoNebs as needed and scheduled -Mucolytic's -Doxycycline  Persistent atrial fibrillation (HCC) EKG showing atrial fibrillation.  She is rate controlled on anticoagulation with Eliquis renally dosed. -Resume Cardizem and metoprolol -Hold Eliquis with worsening thrombocytopenia  Chronic combined systolic and diastolic CHF (congestive heart failure) ([place]) Chest x-ray today showing central pulmonary vascular congestion.  No evidence of pulmonary edema.  BNP elevated at 1783, chronically elevated.  Volume status is hard to tell.  She has  no peripheral signs of volume overload. -Per HD  ESRD on dialysis [facility]) Schedule Tuesday Thursday Saturday.  Chest x-ray suggesting pulmonary vascular congestion without overt pulmonary edema. -Secure chat message sent to nephrologist on-call for tomorrow, also added to treatment team.  Thrombocytopenia ([place]) Platelets 67.  For the past 4 months, platelets have gradually trended down from 130s baseline.  But never been as low as 67. -Hold Eliquis for now -SCDs   Chronic respiratory failure ([place]) On 2 to 3 L.  Currently on 3.5 L sats greater than 95%.  Type 2 diabetes mellitus with nephropathy ([place]) HD status.  Not on medications for diabetes. -Sliding scale insulin ultrasensitive, while on steroids   DVT prophylaxis: SCDS Code Status: Full Family Communication: None at bedside Disposition Plan: ~ 2 days Consults called: Nephrology Admission status:  Obs tele   Author: Bethena Roys, MD 07/25/2021 10:04 PM  For on call review [email or web address].

## 2021-07-25 NOTE — ED Provider Notes (Addendum)
[facility] EMERGENCY DEPARTMENT Provider Note   CSN: [id number] Arrival date & time: 07/25/21  1508     History  Chief Complaint  Patient presents with   Shortness of Breath    Jody [name] is a 81 y.o. female.  Patient with history of end-stage renal disease on hemodialysis Tuesday Thursday Saturday, presents from assisted living facility, chief complaint shortness of breath started this morning.  Was given a breathing treatment without significant relief.  Denies any new cough denies any new fevers no vomiting or diarrhea.  Complaining of tightness in the chest and shortness of breath.       Home Medications Prior to Admission medications   Medication Sig Start Date End Date Taking? Authorizing Provider  acetaminophen (TYLENOL) 500 MG tablet Take 1 tablet (500 mg total) by mouth 3 (three) times daily. 04/13/21  Yes Regalado, Belkys A, MD  albuterol (ACCUNEB) 0.63 MG/3ML nebulizer solution Take 1 ampule by nebulization every 6 (six) hours as needed for wheezing. Patient taking 2.'5mg'$    Yes [provider]  albuterol (PROVENTIL HFA;VENTOLIN HFA) 108 (90 BASE) MCG/ACT inhaler Inhale 1-2 puffs into the lungs every 6 (six) hours as needed for wheezing or shortness of breath. Reported on 03/01/2015   Yes [provider]  apixaban (ELIQUIS) 2.5 MG TABS tablet Take 2.5 mg by mouth 2 (two) times daily.   Yes [provider]  atorvastatin (LIPITOR) 40 MG tablet Take 1 tablet by mouth at bedtime. 01/22/16  Yes [provider]  cyanocobalamin 1000 MCG tablet Take 1 tablet by mouth daily.   Yes [provider]  diltiazem (CARDIZEM CD) 120 MG 24 hr capsule Take 120 mg by mouth daily. 05/12/21  Yes [provider]  ferrous sulfate 325 (65 FE) MG tablet Take 325 mg by mouth 2 (two) times daily with a meal.   Yes [provider]  folic acid (FOLVITE) 1 MG tablet Take 1 tablet (1 mg total) by mouth daily. 06/08/21  Yes Tat, Shanon Brow, MD   furosemide (LASIX) 40 MG tablet Take 40 mg by mouth daily. 05/12/21  Yes [provider]  gabapentin (NEURONTIN) 300 MG capsule Take 300 mg by mouth daily. 05/11/21  Yes [provider]  metoprolol tartrate (LOPRESSOR) 25 MG tablet Take 0.5 tablets (12.5 mg total) by mouth 2 (two) times daily. 10/18/16  Yes Kathie Dike, MD  Multiple Vitamins-Minerals (ONCOVITE) TABS Take 1 tablet by mouth at bedtime.   Yes [provider]  Nutritional Supplements (FEEDING SUPPLEMENT, NEPRO CARB STEADY,) LIQD Take 237 mLs by mouth 2 (two) times daily between meals. 06/07/21  Yes Tat, Shanon Brow, MD  sevelamer carbonate (RENVELA) 800 MG tablet Take 800 mg by mouth 3 (three) times daily with meals.  07/22/19  Yes [provider]      Allergies    Codeine    Review of Systems   Review of Systems  Constitutional:  Negative for fever.  HENT:  Negative for ear pain.   Eyes:  Negative for pain.  Respiratory:  Positive for chest tightness and shortness of breath.   Cardiovascular:  Negative for chest pain.  Gastrointestinal:  Negative for abdominal pain.  Genitourinary:  Negative for flank pain.  Musculoskeletal:  Negative for back pain.  Skin:  Negative for rash.  Neurological:  Negative for headaches.    Physical Exam Updated Vital Signs BP (!) 110/45   Pulse 84   Temp 98.5 F (36.9 C) (Oral)   Resp 19   Ht '5\' 7"'$  (  1.702 m)   Wt 90.7 kg   SpO2 92%   BMI 31.32 kg/m  Physical Exam Constitutional:      Appearance: Normal appearance.  HENT:     Head: Normocephalic.     Nose: Nose normal.  Eyes:     Extraocular Movements: Extraocular movements intact.  Cardiovascular:     Rate and Rhythm: Normal rate.  Pulmonary:     Effort: Tachypnea and accessory muscle usage present.     Breath sounds: Wheezing present.  Musculoskeletal:        General: Normal range of motion.     Cervical back: Normal range of motion.  Neurological:     General: No focal deficit present.      Mental Status: She is alert and oriented to person, place, and time. Mental status is at baseline.     Cranial Nerves: No cranial nerve deficit.     Motor: No weakness.     ED Results / Procedures / Treatments   Labs (all labs ordered are listed, but only abnormal results are displayed) Labs Reviewed  CBC WITH DIFFERENTIAL/PLATELET - Abnormal; Notable for the following components:      Result Value   RBC 2.88 (*)    Hemoglobin 9.4 (*)    HCT 32.2 (*)    MCV 111.8 (*)    MCHC 29.2 (*)    RDW 17.2 (*)    Platelets 67 (*)    nRBC 0.4 (*)    All other components within normal limits  BASIC METABOLIC PANEL - Abnormal; Notable for the following components:   Glucose, Bld 133 (*)    BUN 41 (*)    Creatinine, Ser 4.40 (*)    GFR, Estimated 9 (*)    All other components within normal limits  BRAIN NATRIURETIC PEPTIDE - Abnormal; Notable for the following components:   B Natriuretic Peptide 1,783.0 (*)    All other components within normal limits  TROPONIN I (HIGH SENSITIVITY) - Abnormal; Notable for the following components:   Troponin I (High Sensitivity) 34 (*)    All other components within normal limits  TROPONIN I (HIGH SENSITIVITY) - Abnormal; Notable for the following components:   Troponin I (High Sensitivity) 35 (*)    All other components within normal limits  RESP PANEL BY RT-PCR (FLU A&B, COVID) [id number]    EKG EKG Interpretation  Date/Time:  Monday July 25 2021 15:21:07 EDT Ventricular Rate:  79 PR Interval:    QRS Duration: 122 QT Interval:  448 QTC Calculation: 514 R Axis:   151 Text Interpretation: Atrial fibrillation Ventricular premature complex RBBB and LPFB Nonspecific T abnormalities, lateral leads Confirmed by Thamas Jaegers ([id number]) on 07/25/2021 3:32:50 PM  Radiology DG Chest Port 1 View  Result Date: 07/25/2021 CLINICAL DATA:  Provided history: Shortness of breath since this morning. History of COPD, former smoker. EXAM: PORTABLE CHEST 1 VIEW COMPARISON:   Prior chest radiographs 07/03/2021 and earlier. FINDINGS: Shallow inspiration radiograph. Cardiomegaly. Aortic atherosclerosis. Central pulmonary vascular congestion without overt pulmonary edema. No definite airspace consolidation. No sizable pleural effusion or evidence of pneumothorax. No acute bony abnormality identified. Degenerative changes of the spine. IMPRESSION: Shallow inspiration radiograph. Cardiomegaly with central pulmonary vascular congestion. No overt pulmonary edema. Aortic Atherosclerosis (ICD10-I70.0). Electronically Signed   By: Kellie Simmering D.O.   On: 07/25/2021 15:48    Procedures .Critical Care  Performed by: Luna Fuse, MD Authorized by: Luna Fuse, MD   Critical care provider statement:  Critical care time (minutes):  40   Critical care time was exclusive of:  Separately billable procedures and treating other patients and teaching time   Critical care was necessary to treat or prevent imminent or life-threatening deterioration of the following conditions:  Respiratory failure     Medications Ordered in ED Medications  methylPREDNISolone sodium succinate (SOLU-MEDROL) 125 mg/2 mL injection 80 mg (80 mg Intravenous Given 07/25/21 1542)  ipratropium-albuterol (DUONEB) 0.5-2.5 (3) MG/3ML nebulizer solution 3 mL (3 mLs Nebulization Given 07/25/21 1713)  furosemide (LASIX) injection 40 mg (40 mg Intravenous Given 07/25/21 1805)  albuterol (PROVENTIL) (2.5 MG/3ML) 0.083% nebulizer solution 2.5 mg (2.5 mg Nebulization Given 07/25/21 1834)    ED Course/ Medical Decision Making/ A&P                           Medical Decision Making Amount and/or Complexity of Data Reviewed Labs: ordered. Radiology: ordered.  Risk Prescription drug management. Decision regarding hospitalization.   Review of external records shows office visit June 2023 for chronic atrial fibrillation.  Cardiac monitoring shows atrial fibrillation.  Work-up includes labs White count normal  5 hemoglobin 9 chemistry shows chronic renal insufficiency creatinine of 4.4.  proBNP elevated 1700 moderately above her baseline levels.  Troponin normal at 35 and flat x2.  Patient's baseline oxygen requirement reportedly around 2 to 3 L nasal cannula currently requiring 4 L nasal cannula to maintain O2 saturation greater than 90%.  Any kind of exertion seems to cause desaturation down into the 88 to 89% range on this oxygen supplementation.  Wheezes heard on exam no significant leg swelling noted.  Patient given Lasix Solu-Medrol and multiple breathing treatments.  Some improvement noted, however still having desaturation episodes, still having strained and clipped speech due to shortness of breath.  Will be admitted to the hospitalist team.        Final Clinical Impression(s) / ED Diagnoses Final diagnoses:  COPD exacerbation ([place])  Acute on chronic congestive heart failure, unspecified heart failure type ([place])  Hypoxemia    Rx / DC Orders ED Discharge Orders     None         Luna Fuse, MD 07/25/21 1845    Luna Fuse, MD 07/25/21 [phone]

## 2021-07-25 NOTE — Assessment & Plan Note (Addendum)
-  EKG/telemetry showing atrial fibrillation.   -Rate controlled  -Prior to admission was on Eliquis for secondary prevention; currently held due to worsening thrombocytopenia.   -Continue to monitor. -Continue the use of Cardizem and metoprolol.

## 2021-07-25 NOTE — Assessment & Plan Note (Addendum)
-  Chest x-ray demonstrated central pulmonary vascular congestion at time of admission -BNP elevated at 1783 and patient complaining of orthopnea and short winded sensation. -Continue to follow daily weights, strict intake and output and low-sodium diet. -Volume management per hemodialysis. -Follow clinical response.

## 2021-07-25 NOTE — ED Triage Notes (Addendum)
Patient via EMS from [facility] [place] with complaints of shortness of breath that started this morning. EMS gave a neb treatment with some relief.

## 2021-07-25 NOTE — Assessment & Plan Note (Addendum)
-  Schedule Tuesday Thursday Saturday.   -chest x-ray suggesting pulmonary vascular congestion  -Nephrology service has been contacted to assist with hemodialysis management.

## 2021-07-25 NOTE — Assessment & Plan Note (Addendum)
-  Patient having difficulty speaking in full sentences, expiratory wheezing appreciated on exam. -Requiring a slightly higher level of oxygen supplementation to where she use her baseline (3.5 L through nasal cannula currently) -Continue treatment with steroids, bronchodilator management, initiation of Pulmicort and the use of flutter valve. -Will also continue doxycycline twice a day for coverage of bronchiectasis component.

## 2021-07-26 DIAGNOSIS — R6889 Other general symptoms and signs: Secondary | ICD-10-CM | POA: Diagnosis not present

## 2021-07-26 DIAGNOSIS — R0602 Shortness of breath: Secondary | ICD-10-CM | POA: Diagnosis not present

## 2021-07-26 DIAGNOSIS — E1121 Type 2 diabetes mellitus with diabetic nephropathy: Secondary | ICD-10-CM | POA: Diagnosis not present

## 2021-07-26 DIAGNOSIS — M549 Dorsalgia, unspecified: Secondary | ICD-10-CM | POA: Diagnosis present

## 2021-07-26 DIAGNOSIS — D631 Anemia in chronic kidney disease: Secondary | ICD-10-CM | POA: Diagnosis not present

## 2021-07-26 DIAGNOSIS — Z7401 Bed confinement status: Secondary | ICD-10-CM | POA: Diagnosis not present

## 2021-07-26 DIAGNOSIS — E669 Obesity, unspecified: Secondary | ICD-10-CM

## 2021-07-26 DIAGNOSIS — I132 Hypertensive heart and chronic kidney disease with heart failure and with stage 5 chronic kidney disease, or end stage renal disease: Secondary | ICD-10-CM | POA: Diagnosis present

## 2021-07-26 DIAGNOSIS — Z79899 Other long term (current) drug therapy: Secondary | ICD-10-CM | POA: Diagnosis not present

## 2021-07-26 DIAGNOSIS — D696 Thrombocytopenia, unspecified: Secondary | ICD-10-CM | POA: Diagnosis present

## 2021-07-26 DIAGNOSIS — J9601 Acute respiratory failure with hypoxia: Secondary | ICD-10-CM | POA: Diagnosis not present

## 2021-07-26 DIAGNOSIS — Z7901 Long term (current) use of anticoagulants: Secondary | ICD-10-CM | POA: Diagnosis not present

## 2021-07-26 DIAGNOSIS — Z811 Family history of alcohol abuse and dependence: Secondary | ICD-10-CM | POA: Diagnosis not present

## 2021-07-26 DIAGNOSIS — J441 Chronic obstructive pulmonary disease with (acute) exacerbation: Secondary | ICD-10-CM | POA: Diagnosis present

## 2021-07-26 DIAGNOSIS — Z96651 Presence of right artificial knee joint: Secondary | ICD-10-CM | POA: Diagnosis present

## 2021-07-26 DIAGNOSIS — I10 Essential (primary) hypertension: Secondary | ICD-10-CM

## 2021-07-26 DIAGNOSIS — I4811 Longstanding persistent atrial fibrillation: Secondary | ICD-10-CM | POA: Diagnosis not present

## 2021-07-26 DIAGNOSIS — Z8249 Family history of ischemic heart disease and other diseases of the circulatory system: Secondary | ICD-10-CM | POA: Diagnosis not present

## 2021-07-26 DIAGNOSIS — Z992 Dependence on renal dialysis: Secondary | ICD-10-CM | POA: Diagnosis not present

## 2021-07-26 DIAGNOSIS — M6281 Muscle weakness (generalized): Secondary | ICD-10-CM | POA: Diagnosis not present

## 2021-07-26 DIAGNOSIS — I120 Hypertensive chronic kidney disease with stage 5 chronic kidney disease or end stage renal disease: Secondary | ICD-10-CM | POA: Diagnosis not present

## 2021-07-26 DIAGNOSIS — Z803 Family history of malignant neoplasm of breast: Secondary | ICD-10-CM | POA: Diagnosis not present

## 2021-07-26 DIAGNOSIS — N2581 Secondary hyperparathyroidism of renal origin: Secondary | ICD-10-CM | POA: Diagnosis present

## 2021-07-26 DIAGNOSIS — E7800 Pure hypercholesterolemia, unspecified: Secondary | ICD-10-CM | POA: Diagnosis present

## 2021-07-26 DIAGNOSIS — Z885 Allergy status to narcotic agent status: Secondary | ICD-10-CM | POA: Diagnosis not present

## 2021-07-26 DIAGNOSIS — Z20822 Contact with and (suspected) exposure to covid-19: Secondary | ICD-10-CM | POA: Diagnosis present

## 2021-07-26 DIAGNOSIS — J9621 Acute and chronic respiratory failure with hypoxia: Secondary | ICD-10-CM | POA: Diagnosis present

## 2021-07-26 DIAGNOSIS — R0902 Hypoxemia: Secondary | ICD-10-CM | POA: Diagnosis present

## 2021-07-26 DIAGNOSIS — G8929 Other chronic pain: Secondary | ICD-10-CM | POA: Diagnosis present

## 2021-07-26 DIAGNOSIS — I4819 Other persistent atrial fibrillation: Secondary | ICD-10-CM | POA: Diagnosis present

## 2021-07-26 DIAGNOSIS — E871 Hypo-osmolality and hyponatremia: Secondary | ICD-10-CM | POA: Diagnosis not present

## 2021-07-26 DIAGNOSIS — I5042 Chronic combined systolic (congestive) and diastolic (congestive) heart failure: Secondary | ICD-10-CM | POA: Diagnosis not present

## 2021-07-26 DIAGNOSIS — Z6831 Body mass index (BMI) 31.0-31.9, adult: Secondary | ICD-10-CM | POA: Diagnosis not present

## 2021-07-26 DIAGNOSIS — Z87891 Personal history of nicotine dependence: Secondary | ICD-10-CM | POA: Diagnosis not present

## 2021-07-26 DIAGNOSIS — I5032 Chronic diastolic (congestive) heart failure: Secondary | ICD-10-CM | POA: Diagnosis not present

## 2021-07-26 DIAGNOSIS — R5381 Other malaise: Secondary | ICD-10-CM | POA: Diagnosis not present

## 2021-07-26 DIAGNOSIS — N186 End stage renal disease: Secondary | ICD-10-CM | POA: Diagnosis present

## 2021-07-26 DIAGNOSIS — Z743 Need for continuous supervision: Secondary | ICD-10-CM | POA: Diagnosis not present

## 2021-07-26 DIAGNOSIS — E1122 Type 2 diabetes mellitus with diabetic chronic kidney disease: Secondary | ICD-10-CM | POA: Diagnosis present

## 2021-07-26 DIAGNOSIS — N250 Renal osteodystrophy: Secondary | ICD-10-CM | POA: Diagnosis not present

## 2021-07-26 LAB — GLUCOSE, CAPILLARY
Glucose-Capillary: 158 mg/dL — ABNORMAL HIGH (ref 70–99)
Glucose-Capillary: 169 mg/dL — ABNORMAL HIGH (ref 70–99)
Glucose-Capillary: 132 mg/dL — ABNORMAL HIGH (ref 70–99)

## 2021-07-26 MED ORDER — ALBUMIN HUMAN 25 % IV SOLN: 25.0000 g | INTRAVENOUS | Status: DC | PRN

## 2021-07-26 MED ORDER — LORAZEPAM 2 MG/ML IJ SOLN
1.0000 mg | Freq: Once | INTRAMUSCULAR | Status: AC
  Administered 2021-07-26: 1 mg via INTRAVENOUS
  Filled 2021-07-26: qty 1

## 2021-07-26 MED ORDER — BUDESONIDE 0.5 MG/2ML IN SUSP
0.5000 mg | Freq: Two times a day (BID) | RESPIRATORY_TRACT | Status: DC
  Administered 2021-07-26 – 2021-07-31 (×11): 0.5 mg via RESPIRATORY_TRACT
  Filled 2021-07-26 (×11): qty 2

## 2021-07-26 MED ORDER — PENTAFLUOROPROP-TETRAFLUOROETH EX AERO
1.0000 "application " | INHALATION_SPRAY | CUTANEOUS | Status: DC | PRN
  Filled 2021-07-26: qty 30

## 2021-07-26 MED ORDER — CHLORHEXIDINE GLUCONATE CLOTH 2 % EX PADS
6.0000 | MEDICATED_PAD | Freq: Every day | CUTANEOUS | Status: DC
  Administered 2021-07-26 – 2021-07-28 (×3): 6 via TOPICAL

## 2021-07-26 MED ORDER — LIDOCAINE HCL (PF) 1 % IJ SOLN: 5.0000 mL | INTRAMUSCULAR | Status: DC | PRN

## 2021-07-26 MED ORDER — LIDOCAINE-PRILOCAINE 2.5-2.5 % EX CREA: 1.0000 "application " | TOPICAL_CREAM | CUTANEOUS | Status: DC | PRN

## 2021-07-26 NOTE — Consult Note (Signed)
[facility] Renal Consultation Note    Indication for Consultation:  Management of ESRD/hemodialysis; anemia, hypertension/volume and secondary hyperparathyroidism  HPI: Jody [name] is a 81 y.o. female  with a PMH significant for HTN, DM type 2, h/o SSS, h/o CVA, COPD, HLD, chronic atrial fibrillation, recent osteomyelitis of left foot, and ESRD who presented to [facility] ED on 07/25/21 with worsening SOB.  In the ED, her SpO2 would drop to 89% on 4 liters , Tmax 99.2, vital signs stable.  Labs notable for BNP 1783, CXR with vascular congestion.  She was given solumedrol and duonebs for COPD exacerbation.  We were consulted to provide HD during her hospitalization.  Past Medical History:  Diagnosis Date   Anemia in chronic kidney disease (CODE) 06/05/2015   Arthritis    "bad in my legs" (12/07/2014)   Chronic atrial fibrillation ([place]) 06/05/2015   Chronic back pain    "dr said my spine is crooked"   Chronic bronchitis ([place])    "get it q yr" (12/07/2014)   CKD stage 4 due to type 2 diabetes mellitus ([place]) 1/61/0960   Complication of anesthesia    headache for 2 days after surgery in May 2019   COPD (chronic obstructive pulmonary disease) ([place])    Gait difficulty 09/02/2014   Hypercholesterolemia    Hypertension    Neuropathy 2015   in legs   NSVT (nonsustained ventricular tachycardia) ([place])    Sinus brady-tachy syndrome ([place])    Stroke ([place]) 05/2014   denies residual on 12/07/2014   Type II diabetes mellitus [facility])    Past Surgical History:  Procedure Laterality Date   ABDOMINAL AORTOGRAM W/LOWER EXTREMITY Left 04/06/2021   Procedure: ABDOMINAL AORTOGRAM W/LOWER EXTREMITY;  Surgeon: Broadus John, MD;  Location: [place] [facility];  Service: Cardiovascular;  Laterality: Left;   ABDOMINAL HYSTERECTOMY  ~ 1970   AMPUTATION TOE Left 04/08/2021   Procedure: FIRST TOE LEFT FOOT AMPUTATION;  Surgeon: Trula Slade, DPM;  Location: [place];  Service: Podiatry;  Laterality:  Left;   APPENDECTOMY  ~ [place] Right 07/06/2017   Procedure: RIGHT RADIOCEPHALIC ARTERIOVENOUS FISTULA creation;  Surgeon: Conrad Alameda, MD;  Location: [place];  Service: Vascular;  Laterality: Right;   [place] Right 09/17/2017   Procedure: SECOND STAGE BASILIC VEIN TRANSPOSITION RIGHT ARM;  Surgeon: Rosetta Posner, MD;  Location: [place];  Service: Vascular;  Laterality: Right;   COLONOSCOPY N/A 10/28/2017   Procedure: COLONOSCOPY;  Surgeon: Rogene Houston, MD;  [facility];  Service: Endoscopy;  Laterality: N/A;   COLONOSCOPY WITH PROPOFOL N/A 12/29/2019   Procedure: COLONOSCOPY WITH PROPOFOL;  Surgeon: Eloise Harman, DO;  [facility];  Service: Endoscopy;  Laterality: N/A;   ESOPHAGOGASTRODUODENOSCOPY (EGD) WITH PROPOFOL N/A 10/31/2017   Procedure: ESOPHAGOGASTRODUODENOSCOPY (EGD) WITH PROPOFOL;  Surgeon: Rogene Houston, MD;  [facility];  Service: Endoscopy;  Laterality: N/A;   JOINT REPLACEMENT     PERIPHERAL VASCULAR BALLOON ANGIOPLASTY Left 04/06/2021   Procedure: PERIPHERAL VASCULAR BALLOON ANGIOPLASTY;  Surgeon: Broadus John, MD;  Location: [place] [facility];  Service: Cardiovascular;  Laterality: Left;  left sfa succesful [place] AT Unsuccesful   TOTAL KNEE ARTHROPLASTY Right ~ 1986   TUMOR EXCISION  ~ 1970   "9# in my stomach"   Family History:   Family History  Problem Relation Age of Onset   Cancer Other    Heart attack Brother  Cancer Sister        breast   Alcohol abuse Brother    Social History:  reports that she quit smoking about 17 years ago. Her smoking use included cigarettes. She started smoking about 68 years ago. She has a 25.00 pack-year smoking history. She has never used smokeless tobacco. She reports that she does not drink alcohol and does not use drugs. Allergies  Allergen Reactions   Codeine Nausea And Vomiting   Prior to Admission medications   Medication Sig Start Date  End Date Taking? Authorizing Provider  acetaminophen (TYLENOL) 500 MG tablet Take 1 tablet (500 mg total) by mouth 3 (three) times daily. 04/13/21  Yes Regalado, Belkys A, MD  albuterol (ACCUNEB) 0.63 MG/3ML nebulizer solution Take 1 ampule by nebulization every 6 (six) hours as needed for wheezing. Patient taking 2.'5mg'$    Yes [provider]  albuterol (PROVENTIL HFA;VENTOLIN HFA) 108 (90 BASE) MCG/ACT inhaler Inhale 1-2 puffs into the lungs every 6 (six) hours as needed for wheezing or shortness of breath. Reported on 03/01/2015   Yes [provider]  apixaban (ELIQUIS) 2.5 MG TABS tablet Take 2.5 mg by mouth 2 (two) times daily.   Yes [provider]  atorvastatin (LIPITOR) 40 MG tablet Take 1 tablet by mouth at bedtime. 01/22/16  Yes [provider]  cyanocobalamin 1000 MCG tablet Take 1 tablet by mouth daily.   Yes [provider]  diltiazem (CARDIZEM CD) 120 MG 24 hr capsule Take 120 mg by mouth daily. 05/12/21  Yes [provider]  ferrous sulfate 325 (65 FE) MG tablet Take 325 mg by mouth 2 (two) times daily with a meal.   Yes [provider]  folic acid (FOLVITE) 1 MG tablet Take 1 tablet (1 mg total) by mouth daily. 06/08/21  Yes Tat, Shanon Brow, MD  furosemide (LASIX) 40 MG tablet Take 40 mg by mouth daily. 05/12/21  Yes [provider]  gabapentin (NEURONTIN) 300 MG capsule Take 300 mg by mouth daily. 05/11/21  Yes [provider]  metoprolol tartrate (LOPRESSOR) 25 MG tablet Take 0.5 tablets (12.5 mg total) by mouth 2 (two) times daily. 10/18/16  Yes Kathie Dike, MD  Multiple Vitamins-Minerals (ONCOVITE) TABS Take 1 tablet by mouth at bedtime.   Yes [provider]  Nutritional Supplements (FEEDING SUPPLEMENT, NEPRO CARB STEADY,) LIQD Take 237 mLs by mouth 2 (two) times daily between meals. 06/07/21  Yes Tat, Shanon Brow, MD  sevelamer carbonate (RENVELA) 800 MG tablet Take 800 mg by mouth 3 (three) times daily with  meals.  07/22/19  Yes [provider]   Current Facility-Administered Medications  Medication Dose Route Frequency Provider Last Rate Last Admin   acetaminophen (TYLENOL) tablet 650 mg  650 mg Oral Q6H PRN Emokpae, Ejiroghene E, MD   650 mg at 07/25/21 2309   Or   acetaminophen (TYLENOL) suppository 650 mg  650 mg Rectal Q6H PRN Emokpae, Ejiroghene E, MD       atorvastatin (LIPITOR) tablet 40 mg  40 mg Oral QHS Emokpae, Ejiroghene E, MD       budesonide (PULMICORT) nebulizer solution 0.5 mg  0.5 mg Nebulization BID Barton Dubois, MD       Chlorhexidine Gluconate Cloth 2 % PADS 6 each  6 each Topical Q0600 Donato Heinz, MD       diltiazem (CARDIZEM CD) 24 hr capsule 120 mg  120 mg Oral Daily Emokpae, Ejiroghene E, MD       doxycycline (VIBRA-TABS) tablet 100 mg  100 mg Oral Q12H Emokpae, Ejiroghene E, MD   100 mg at 07/26/21 0834   gabapentin (NEURONTIN) capsule 300 mg  300 mg Oral Daily Emokpae, Ejiroghene E, MD   300 mg at 07/26/21 0834   insulin aspart (novoLOG) injection 0-5 Units  0-5 Units Subcutaneous QHS Emokpae, Ejiroghene E, MD       insulin aspart (novoLOG) injection 0-6 Units  0-6 Units Subcutaneous TID WC Emokpae, Ejiroghene E, MD   1 Units at 07/26/21 0835   ipratropium-albuterol (DUONEB) 0.5-2.5 (3) MG/3ML nebulizer solution 3 mL  3 mL Nebulization Q8H Emokpae, Ejiroghene E, MD   3 mL at 07/26/21 0622   ipratropium-albuterol (DUONEB) 0.5-2.5 (3) MG/3ML nebulizer solution 3 mL  3 mL Nebulization Q4H PRN Emokpae, Ejiroghene E, MD       methylPREDNISolone sodium succinate (SOLU-MEDROL) 125 mg/2 mL injection 60 mg  60 mg Intravenous Q12H Emokpae, Ejiroghene E, MD   60 mg at 07/26/21 0123   Followed by   Derrill Memo ON 07/27/2021] predniSONE (DELTASONE) tablet 40 mg  40 mg Oral Q breakfast Emokpae, Ejiroghene E, MD       metoprolol tartrate (LOPRESSOR) tablet 12.5 mg  12.5 mg Oral BID Emokpae, Ejiroghene E, MD   12.5 mg at 07/25/21 2309   polyethylene glycol (MIRALAX / GLYCOLAX)  packet 17 g  17 g Oral Daily PRN Emokpae, Ejiroghene E, MD       sevelamer carbonate (RENVELA) tablet 800 mg  800 mg Oral TID WC Emokpae, Ejiroghene E, MD   800 mg at 07/26/21 0834   Labs: Basic Metabolic Panel: Recent Labs  Lab 07/25/21 1552  NA 137  K 4.3  CL 102  CO2 27  GLUCOSE 133*  BUN 41*  CREATININE 4.40*  CALCIUM 10.3   Liver Function Tests: No results for input(s): "AST", "ALT", "ALKPHOS", "BILITOT", "PROT", "ALBUMIN" in the last 168 hours. No results for input(s): "LIPASE", "AMYLASE" in the last 168 hours. No results for input(s): "AMMONIA" in the last 168 hours. CBC: Recent Labs  Lab 07/25/21 1552  WBC 5.6  NEUTROABS 4.3  HGB 9.4*  HCT 32.2*  MCV 111.8*  PLT 67*   Cardiac Enzymes: No results for input(s): "CKTOTAL", "CKMB", "CKMBINDEX", "TROPONINI" in the last 168 hours. CBG: Recent Labs  Lab 07/25/21 2110 07/26/21 0726  GLUCAP 162* 158*   Iron Studies: No results for input(s): "IRON", "TIBC", "TRANSFERRIN", "FERRITIN" in the last 72 hours. Studies/Results: DG Chest Port 1 View  Result Date: 07/25/2021 CLINICAL DATA:  Provided history: Shortness of breath since this morning. History of COPD, former smoker. EXAM: PORTABLE CHEST 1 VIEW COMPARISON:  Prior chest radiographs 07/03/2021 and earlier. FINDINGS: Shallow inspiration radiograph. Cardiomegaly. Aortic atherosclerosis. Central pulmonary vascular congestion without overt pulmonary edema. No definite airspace consolidation. No sizable pleural effusion or evidence of pneumothorax. No acute bony abnormality identified. Degenerative changes of the spine. IMPRESSION: Shallow inspiration radiograph. Cardiomegaly with central pulmonary vascular congestion. No overt pulmonary edema. Aortic Atherosclerosis (ICD10-I70.0). Electronically Signed   By: Kellie Simmering D.O.   On: 07/25/2021 15:48    ROS: Pertinent items are noted in HPI. Physical Exam: Vitals:   07/25/21 2333 07/26/21 0122 07/26/21 0449 07/26/21 0622   BP:  126/89 128/84   Pulse:  98 98   Resp:  19 (!) 21   Temp:  98.5 F (36.9 C) 97.9 F (36.6 C)   TempSrc:      SpO2: 97% 99% 99% 97%  Weight:      Height:  Weight change:   Intake/Output Summary (Last 24 hours) at 07/26/2021 0920 Last data filed at 07/25/2021 2300 Gross per 24 hour  Intake 240 ml  Output --  Net 240 ml   BP 128/84 (BP Location: Left Arm)   Pulse 98   Temp 97.9 F (36.6 C)   Resp (!) 21   Ht '5\' 7"'$  (1.702 m)   Wt 90.7 kg   SpO2 97%   BMI 31.32 kg/m  General appearance: alert and moderate distress Head: Normocephalic, without obvious abnormality, atraumatic Resp: rhonchi bilaterally Cardio: IRR IRR, no murmur, click, rub or gallop GI: soft, non-tender; bowel sounds normal; no masses,  no organomegaly Extremities: extremities normal, atraumatic, no cyanosis or edema and RUE AVF +T/B Dialysis Access:  Dialysis Orders: Center: [facility]  on TTS . EDW 87 Kg HD Bath 2K/2.5Ca  Time 3:15 Heparin none. Access RUE AVF BFR 400 DFR 500    Micera 200 mcg IV every 2 weeks  Assessment/Plan:  COPD exacerbation - currently on solumedrol 60 twice daily, duoNebs, mucolytics and doxycycline per primary  ESRD -  Will plan for HD today to keep on schedule and UF as tolerated  Hypertension/volume  - stable  Anemia  -  Continue to follow.  Unclear when last Micera dose given but may need ESA this week if Hgb starts to drop  Metabolic bone disease -   continue with home meds  Nutrition - renal diet, carb modified  Persistent atrial fibrillation - continue metoprolol and cardizem.  Eliquis on hold due to thrombocytopenia.  Thrombocytopenia - have been low for the last 2 months.  Eliquis on hold.  Donetta Potts, MD [place] 07/26/2021, 9:20 AM

## 2021-07-26 NOTE — Progress Notes (Signed)
Pt oriented to room 337, call light system, belongings policy and fall prevention protocol. Pt in room with call light and belongings within reach, bed in lowest position and bed alarm activated.

## 2021-07-26 NOTE — Procedures (Signed)
    HEMODIALYSIS TREATMENT NOTE:   Uneventful 3.5 hour heparin-free treatment completed using right upper arm AVF (15g/antegrade). Goal met: 2.3 liters removed without interruption in UF.  All blood was returned and hemostasis was achieved in 15 minutes.  No changes from pre-HD assessment.  Pt has been restless and jittery the entire treatment, picking at lines, leads, and pulse ox probe.  Report given to primary nurse Shenelle Blackwell.   Angela Poteat, RN 

## 2021-07-26 NOTE — TOC Initial Note (Signed)
Transition of Care [facility]) - Initial/Assessment Note    Patient Details  [name] MRN: [id number] Date of Birth: 09-24-1940  [facility] CM/SW Contact:    Ihor Gully, LCSW Phone Number: 07/26/2021, 1:11 PM  Clinical Narrative:                 Patient has been a resident at [facility] since 03/07/21. She initially admitted for rehab however she is now LTC. Plan is her for to return at d/c. Patient to have HD today.   Expected Discharge Plan: [place]     Patient Goals and CMS Choice Patient states their goals for this hospitalization and ongoing recovery are:: return to facility      Expected Discharge Plan and Services Expected Discharge Plan: [place] arrangements for the past 2 months: [place]                                      Prior Living Arrangements/Services Living arrangements for the past 2 months: [place] Lives with:: Facility Resident Patient language and need for interpreter reviewed:: Yes Do you feel safe going back to the place where you live?: Yes      Need for Family Participation in Patient Care: Yes (Comment)   Current home services: DME Criminal Activity/Legal Involvement Pertinent to Current Situation/Hospitalization: No - Comment as needed  Activities of Daily Living Home Assistive Devices/Equipment: Eyeglasses, [employer] fast streamer, Oxygen, Wheelchair ADL Screening (condition at time of admission) Patient's cognitive ability adequate to safely complete daily activities?: No Is the patient deaf or have difficulty hearing?: No Does the patient have difficulty seeing, even when wearing glasses/contacts?: No Does the patient have difficulty concentrating, remembering, or making decisions?: Yes Patient able to express need for assistance with ADLs?: Yes Does the patient have difficulty dressing or bathing?: Yes Independently performs ADLs?:  No Communication: Independent Dressing (OT): Dependent Is this a change from baseline?: Pre-admission baseline Grooming: Dependent Is this a change from baseline?: Pre-admission baseline Feeding: Independent Bathing: Dependent Is this a change from baseline?: Pre-admission baseline Is this a change from baseline?: Pre-admission baseline In/Out Bed: Dependent Is this a change from baseline?: Pre-admission baseline Walks in Home: Dependent Is this a change from baseline?: Pre-admission baseline Does the patient have difficulty walking or climbing stairs?: No Weakness of Legs: Both Weakness of Arms/Hands: Both  Permission Sought/Granted Permission sought to share information with : Family Supports    Share Information with NAME: niece, POA, Ms. Dalton           Emotional Assessment     Affect (typically observed): Appropriate        Admission diagnosis:  Hypoxemia [R09.02] COPD exacerbation (HCC) [J44.1] COPD with acute exacerbation ([place]) [J44.1] Acute on chronic congestive heart failure, unspecified heart failure type [facility]) [I50.9] Patient Active Problem List   Diagnosis Date Noted   Class 1 obesity 07/26/2021   COPD with acute exacerbation ([place]) 07/25/2021   Chest pain 07/02/2021   Pericardial friction rub 06/03/2021   Acute on chronic respiratory failure with hypoxia and hypercapnia ([place]) 06/01/2021   Septic shock ([place]) 06/01/2021   Lobar pneumonia ([place]) 06/01/2021   Persistent atrial fibrillation ([place]) 06/01/2021   Thrombocytopenia ([place]) 05/31/2021   Acute hematogenous osteomyelitis of right foot ([place]) 05/23/2021   PAD (peripheral artery disease) ([place]) 05/23/2021   Leg  pain 98/92/1194   Acute metabolic encephalopathy 17/40/8144   Sinus bradycardia 04/07/2021   Hypokalemia 04/05/2021   History of CVA (cerebrovascular accident) 04/04/2021   Acute osteomyelitis of left foot ([place]) 04/04/2021   Chronic respiratory failure ([place]) 04/04/2021   Anemia of chronic  kidney failure 04/04/2021   Hyponatremia 04/04/2021   Memory loss 04/04/2021   Pressure injury of skin 03/04/2021   Rectal bleeding 12/27/2019   NSVT (nonsustained ventricular tachycardia) ([place]) 10/27/2019   Megaloblastic anemia 10/26/2019   Chronic blood loss anemia 10/26/2017   Iron deficiency anemia 02/23/2017   Chronic combined systolic and diastolic CHF (congestive heart failure) ([place]) 10/15/2016   Elevated troponin 10/15/2016   Type 2 diabetes mellitus with nephropathy ([place]) 10/15/2016   ESRD on dialysis ([place]) 10/15/2016   NSTEMI (non-ST elevated myocardial infarction) ([place]) 10/03/2015   Anemia in chronic kidney disease, Stage IV 06/05/2015   TIA (transient ischemic attack) 09/02/2014   Gait difficulty 09/02/2014   Other specified transient cerebral ischemias    Stroke with cerebral ischemia ([place])    Hemispheric carotid artery syndrome    Essential hypertension    PCP:  Monico Blitz, MD Pharmacy:   [facility], [place] [address] Phone: [phone] Fax: [phone]     Social Determinants of Health (SDOH) Interventions    Readmission Risk Interventions    06/07/2021    2:47 PM 06/01/2021   10:59 AM 12/30/2019    1:03 PM  Readmission Risk Prevention Plan  Transportation Screening Complete Complete Complete  HRI or Home Care Consult   Complete  Social Work Consult for [place] Planning/Counseling   Complete  Palliative Care Screening   Not Complete  Medication Review Press photographer) Complete Complete Complete  PCP or Specialist appointment within 3-5 days of discharge Complete    HRI or Home Care Consult Complete Complete   SW Recovery Care/Counseling Consult Complete Complete   Palliative Care Screening Complete Not Applicable   Skilled Nursing Facility Complete Complete

## 2021-07-26 NOTE — Progress Notes (Signed)
Progress Note   Patient: Jody [name] OZH:[id number] DOB: November 16, 1940 DOA: 07/25/2021     0 DOS: the patient was seen and examined on 07/26/2021   Brief hospital course: As per H&P written by Dr. Denton Brick on 07/25/2021 Jody [name] is a 81 y.o. female with medical history significant for atrial fibrillation, ESRD, systolic and diastolic CHF, hypertension, diabetes mellitus, COPD with chronic respiratory failure. Patient presented to the ED with complaints of difficulty breathing that started today.  She denies cough.  No chest pain.  No lower extremity swelling.  She is on 2  - 3 L of oxygen at baseline.   Recent hospitalization 5/20 through 5/22 for chest pains, troponins EKG unremarkable.  Dialyzed during hospitalization and discharged.   ED Course: O2 sats intermittently dropping to 89% on 4 L, Tmax 99.2.  Heart rate mostly 60s to 108.  Respiratory rate 19-24.  Blood pressure systolic 629-528. BNP 1783.  Chest x-ray showing vascular congestion without pulmonary edema.  Troponin 34 > 35. Solu-Medrol '80mg'$  given.  DuoNebs given.  Hospitalist to admit for COPD exacerbation.   Assessment and Plan: * COPD with acute exacerbation (Jody [name]) -Patient having difficulty speaking in full sentences, expiratory wheezing appreciated on exam. -Requiring a slightly higher level of oxygen supplementation to where she use her baseline (3.5 L through nasal cannula currently) -Continue treatment with steroids, bronchodilator management, initiation of Pulmicort and the use of flutter valve. -Will also continue doxycycline twice a day for coverage of bronchiectasis component.    Persistent atrial fibrillation (HCC) -EKG/telemetry showing atrial fibrillation.   -Rate controlled  -Prior to admission was on Eliquis for secondary prevention; currently held due to worsening thrombocytopenia.   -Continue to monitor. -Continue the use of Cardizem and metoprolol.    Chronic combined systolic and diastolic CHF  (congestive heart failure) (HCC) -Chest x-ray demonstrated central pulmonary vascular congestion at time of admission -BNP elevated at 1783 and patient complaining of orthopnea and short winded sensation. -Continue to follow daily weights, strict intake and output and low-sodium diet. -Volume management per hemodialysis. -Follow clinical response.  ESRD on dialysis [facility]) -Schedule Tuesday Thursday Saturday.   -chest x-ray suggesting pulmonary vascular congestion  -Nephrology service has been contacted to assist with hemodialysis management.   Thrombocytopenia (HCC) -Platelets down to 67K.   -no overt bleeding -holding eliquis currently -follow platelets count after volume stabilization -if needed discuss with Hematology service. -receiving steroids as part of tx for COPD exacerbation. -SCD's for DVT prophylaxis initiated. -per chart review patient with mild chronic thrombocytopenia with platelets count in 120-130: range before.   Class 1 obesity -Body mass index is 31.32 kg/m.  -low calorie diet and portion control discussed with patient.  Chronic respiratory failure (HCC) -On 2 to 3 L at baseline. -at time of admission required transient use of 4L and now on 3.5L through [place]. -continue to wean down to baseline as tolerated.   Type 2 diabetes mellitus with nephropathy (HCC) -chronically on HD. -Not on medications for diabetes currently; following diet control only -will continue SSI while inpatient and follow CBG's fluctuation.   Essential hypertension -stable overall -continue current antihypertensive regimen  -low sodium diet discussed with patient -follow VS stability.     Subjective:  Demonstrating difficulty speaking in full sentences, requiring 3.5 L nasal cannula supplementation to maintain saturation above 90-92%; patient reports shortness of breath with activity and as mentioned below in her physical exam fine crackles were appreciated on  auscultation.  Physical Exam: Vitals:   07/25/21 2333  07/26/21 0122 07/26/21 0449 07/26/21 0622  BP:  126/89 128/84   Pulse:  98 98   Resp:  19 (!) 21   Temp:  98.5 F (36.9 C) 97.9 F (36.6 C)   TempSrc:      SpO2: 97% 99% 99% 97%  Weight:      Height:       General exam: Alert, awake, oriented x 3; afebrile, no chest pain.  Demonstrating difficulty speaking in full sentences and using 3.5 L nasal cannula supplementation.  Patient also reported short winded sensation with activity. Respiratory system: Fine crackles at the bases, fair air movement bilaterally; positive expiratory wheezing appreciated on exam.  No using accessory muscles. Cardiovascular system: Rate controlled, no rubs, no gallops, no JVD. Gastrointestinal system: Abdomen is nondistended, soft and nontender. No organomegaly or masses felt. Normal bowel sounds heard. Central nervous system: No focal neurological deficits. Extremities: No cyanosis or clubbing. Skin: No petechiae. Psychiatry: Judgement and insight appear normal. Mood & affect appropriate.   Data Reviewed: Chest x-ray demonstrating vascular congestion without frank opacities or infiltrates. -Respiratory panel by PCR negative for COVID and influenza BNP 1783 CBGs fluctuating in the 150s to 160s range.   Family Communication: No family at bedside.  Disposition: Status is: Observation The patient will require care spanning > 2 midnights and should be moved to inpatient because: Patient is still short of breath, demonstrating difficulty speaking in full sentences and complaining of dyspnea on exertion.  Slightly higher amount of oxygen supplementation compared to baseline.  Will have dialysis for assisting with volume management later today.  The ileus but I can anticipate discharge back home will be 07/27/2021.   Planned Discharge Destination: Home   Author: Barton Dubois, MD 07/26/2021 8:10 AM  For on call review [email or web address].

## 2021-07-26 NOTE — Progress Notes (Signed)
  Transition of Care [facility]) Screening Note   Patient Details  [name] Date of Birth: December 25, 1940   Transition of Care [facility]) CM/SW Contact:    Ihor Gully, LCSW Phone Number: 07/26/2021, 1:04 PM    Transition of Care Department [facility]) has reviewed patient and no TOC needs have been identified at this time. We will continue to monitor patient advancement through interdisciplinary progression rounds. If new patient transition needs arise, please place a TOC consult.

## 2021-07-26 NOTE — Assessment & Plan Note (Signed)
-  stable overall -continue current antihypertensive regimen  -low sodium diet discussed with patient -follow VS stability.

## 2021-07-26 NOTE — Assessment & Plan Note (Signed)
-  Body mass index is 31.32 kg/m.  -low calorie diet and portion control discussed with patient.

## 2021-07-26 NOTE — NC FL2 (Signed)
[place] MEDICAID FL2 LEVEL OF CARE SCREENING TOOL     IDENTIFICATION  Patient [name] Birthdate: 03/29/1940 Sex: female Admission Date (Current Location): 07/25/2021  [facility] and [place] Number:  [employer] and Address:  [place] [address], [place]      Provider Number: [id number]  Attending Physician Name and Address:  Barton Dubois, MD  Relative Name and Phone Number:  [name] Niece [phone]   [phone] POA    Current Level of Care: Hospital (obs) Recommended Level of Care: [place] Prior Approval Number:    Date Approved/Denied:   PASRR Number:    Discharge Plan: SNF    Current Diagnoses: Patient Active Problem List   Diagnosis Date Noted   Class 1 obesity 07/26/2021   COPD with acute exacerbation ([place]) 07/25/2021   Chest pain 07/02/2021   Pericardial friction rub 06/03/2021   Acute on chronic respiratory failure with hypoxia and hypercapnia ([place]) 06/01/2021   Septic shock ([place]) 06/01/2021   Lobar pneumonia ([place]) 06/01/2021   Persistent atrial fibrillation ([place]) 06/01/2021   Thrombocytopenia ([place]) 05/31/2021   Acute hematogenous osteomyelitis of right foot ([place]) 05/23/2021   PAD (peripheral artery disease) ([place]) 05/23/2021   Leg pain 08/65/7846   Acute metabolic encephalopathy 96/29/5284   Sinus bradycardia 04/07/2021   Hypokalemia 04/05/2021   History of CVA (cerebrovascular accident) 04/04/2021   Acute osteomyelitis of left foot ([place]) 04/04/2021   Chronic respiratory failure ([place]) 04/04/2021   Anemia of chronic kidney failure 04/04/2021   Hyponatremia 04/04/2021   Memory loss 04/04/2021   Pressure injury of skin 03/04/2021   Rectal bleeding 12/27/2019   NSVT (nonsustained ventricular tachycardia) ([place]) 10/27/2019   Megaloblastic anemia 10/26/2019   Chronic blood loss anemia 10/26/2017   Iron deficiency anemia 02/23/2017   Chronic combined systolic and diastolic  CHF (congestive heart failure) ([place]) 10/15/2016   Elevated troponin 10/15/2016   Type 2 diabetes mellitus with nephropathy ([place]) 10/15/2016   ESRD on dialysis ([place]) 10/15/2016   NSTEMI (non-ST elevated myocardial infarction) ([place]) 10/03/2015   Anemia in chronic kidney disease, Stage IV 06/05/2015   TIA (transient ischemic attack) 09/02/2014   Gait difficulty 09/02/2014   Other specified transient cerebral ischemias    Stroke with cerebral ischemia (HCC)    Hemispheric carotid artery syndrome    Essential hypertension     Orientation RESPIRATION BLADDER Height & Weight     Self, Situation, Place  O2 (3L) Incontinent Weight: 199 lb 4.7 oz (90.4 kg) Height:  '5\' 7"'$  (170.2 cm)  BEHAVIORAL SYMPTOMS/MOOD NEUROLOGICAL BOWEL NUTRITION STATUS      Incontinent Diet (renal/carb modified)  AMBULATORY STATUS COMMUNICATION OF NEEDS Skin   Total Care Verbally Normal                       Personal Care Assistance Level of Assistance  Bathing, Feeding, Dressing Bathing Assistance: Maximum assistance Feeding assistance: Independent Dressing Assistance: Maximum assistance     Functional Limitations Info             SPECIAL CARE FACTORS FREQUENCY                       Contractures Contractures Info: Not present    Additional Factors Info  Code Status, Allergies Code Status Info: Full Code Allergies Info: Codeine           Current Medications (07/26/2021):  This is the current hospital active medication list  Current Facility-Administered Medications  Medication Dose Route Frequency Provider Last Rate Last Admin   acetaminophen (TYLENOL) tablet 650 mg  650 mg Oral Q6H PRN Emokpae, Ejiroghene E, MD   650 mg at 07/25/21 2309   Or   acetaminophen (TYLENOL) suppository 650 mg  650 mg Rectal Q6H PRN Emokpae, Ejiroghene E, MD       albumin human 25 % solution 25 g  25 g Intravenous Q1H PRN Donato Heinz, MD       atorvastatin (LIPITOR) tablet 40 mg  40 mg Oral QHS  Emokpae, Ejiroghene E, MD       budesonide (PULMICORT) nebulizer solution 0.5 mg  0.5 mg Nebulization BID Barton Dubois, MD   0.5 mg at 07/26/21 1002   Chlorhexidine Gluconate Cloth 2 % PADS 6 each  6 each Topical Q0600 Donato Heinz, MD   6 each at 07/26/21 0900   diltiazem (CARDIZEM CD) 24 hr capsule 120 mg  120 mg Oral Daily Emokpae, Ejiroghene E, MD       doxycycline (VIBRA-TABS) tablet 100 mg  100 mg Oral Q12H Emokpae, Ejiroghene E, MD   100 mg at 07/26/21 0834   gabapentin (NEURONTIN) capsule 300 mg  300 mg Oral Daily Emokpae, Ejiroghene E, MD   300 mg at 07/26/21 0834   insulin aspart (novoLOG) injection 0-5 Units  0-5 Units Subcutaneous QHS Emokpae, Ejiroghene E, MD       insulin aspart (novoLOG) injection 0-6 Units  0-6 Units Subcutaneous TID WC Emokpae, Ejiroghene E, MD   1 Units at 07/26/21 1206   ipratropium-albuterol (DUONEB) 0.5-2.5 (3) MG/3ML nebulizer solution 3 mL  3 mL Nebulization Q8H Emokpae, Ejiroghene E, MD   3 mL at 07/26/21 0622   ipratropium-albuterol (DUONEB) 0.5-2.5 (3) MG/3ML nebulizer solution 3 mL  3 mL Nebulization Q4H PRN Emokpae, Ejiroghene E, MD       lidocaine (PF) (XYLOCAINE) 1 % injection 5 mL  5 mL Intradermal PRN Donato Heinz, MD       lidocaine-prilocaine (EMLA) cream 1 application   1 application  Topical PRN Donato Heinz, MD       methylPREDNISolone sodium succinate (SOLU-MEDROL) 125 mg/2 mL injection 60 mg  60 mg Intravenous Q12H Emokpae, Ejiroghene E, MD   60 mg at 07/26/21 0123   Followed by   Derrill Memo ON 07/27/2021] predniSONE (DELTASONE) tablet 40 mg  40 mg Oral Q breakfast Emokpae, Ejiroghene E, MD       metoprolol tartrate (LOPRESSOR) tablet 12.5 mg  12.5 mg Oral BID Emokpae, Ejiroghene E, MD   12.5 mg at 07/25/21 2309   pentafluoroprop-tetrafluoroeth (GEBAUERS) aerosol 1 application   1 application  Topical PRN Donato Heinz, MD       polyethylene glycol (MIRALAX / GLYCOLAX) packet 17 g  17 g Oral Daily PRN Emokpae, Ejiroghene E,  MD       sevelamer carbonate (RENVELA) tablet 800 mg  800 mg Oral TID WC Emokpae, Ejiroghene E, MD   800 mg at 07/26/21 1206     Discharge Medications: Please see discharge summary for a list of discharge medications.  Relevant Imaging Results:  Relevant Lab Results:   Additional Information Dialysis TTS  Marcelo Ickes, Clydene Pugh, LCSW

## 2021-07-26 NOTE — NC FL2 (Deleted)
[place] MEDICAID FL2 LEVEL OF CARE SCREENING TOOL     IDENTIFICATION  Patient [name] Birthdate: 09-Nov-1940 Sex: female Admission Date (Current Location): 07/25/2021  [facility] and [place] Number:  [employer] and Address:  [place] [address] [place]      Provider Number: [id number]  Attending Physician Name and Address:  Barton Dubois, MD  Relative Name and Phone Number:  [name] Niece [phone]   [phone] POA    Current Level of Care: Hospital (obs) Recommended Level of Care: [place] Prior Approval Number:    Date Approved/Denied:   PASRR Number:    Discharge Plan: SNF    Current Diagnoses: Patient Active Problem List   Diagnosis Date Noted   Class 1 obesity 07/26/2021   COPD with acute exacerbation ([place]) 07/25/2021   Chest pain 07/02/2021   Pericardial friction rub 06/03/2021   Acute on chronic respiratory failure with hypoxia and hypercapnia ([place]) 06/01/2021   Septic shock ([place]) 06/01/2021   Lobar pneumonia ([place]) 06/01/2021   Persistent atrial fibrillation ([place]) 06/01/2021   Thrombocytopenia ([place]) 05/31/2021   Acute hematogenous osteomyelitis of right foot ([place]) 05/23/2021   PAD (peripheral artery disease) ([place]) 05/23/2021   Leg pain 09/98/3382   Acute metabolic encephalopathy 50/53/9767   Sinus bradycardia 04/07/2021   Hypokalemia 04/05/2021   History of CVA (cerebrovascular accident) 04/04/2021   Acute osteomyelitis of left foot ([place]) 04/04/2021   Chronic respiratory failure ([place]) 04/04/2021   Anemia of chronic kidney failure 04/04/2021   Hyponatremia 04/04/2021   Memory loss 04/04/2021   Pressure injury of skin 03/04/2021   Rectal bleeding 12/27/2019   NSVT (nonsustained ventricular tachycardia) ([place]) 10/27/2019   Megaloblastic anemia 10/26/2019   Chronic blood loss anemia 10/26/2017   Iron deficiency anemia 02/23/2017   Chronic combined systolic and diastolic  CHF (congestive heart failure) ([place]) 10/15/2016   Elevated troponin 10/15/2016   Type 2 diabetes mellitus with nephropathy ([place]) 10/15/2016   ESRD on dialysis ([place]) 10/15/2016   NSTEMI (non-ST elevated myocardial infarction) ([place]) 10/03/2015   Anemia in chronic kidney disease, Stage IV 06/05/2015   TIA (transient ischemic attack) 09/02/2014   Gait difficulty 09/02/2014   Other specified transient cerebral ischemias    Stroke with cerebral ischemia (HCC)    Hemispheric carotid artery syndrome    Essential hypertension     Orientation RESPIRATION BLADDER Height & Weight     Self, Situation, Place  O2 (3L) Incontinent Weight: 199 lb 4.7 oz (90.4 kg) Height:  '5\' 7"'$  (170.2 cm)  BEHAVIORAL SYMPTOMS/MOOD NEUROLOGICAL BOWEL NUTRITION STATUS      Incontinent Diet (renal/carb modified)  AMBULATORY STATUS COMMUNICATION OF NEEDS Skin   Limited Assist Verbally Normal                       Personal Care Assistance Level of Assistance  Bathing, Feeding, Dressing Bathing Assistance: Limited assistance Feeding assistance: Independent Dressing Assistance: Limited assistance     Functional Limitations Info             SPECIAL CARE FACTORS FREQUENCY                       Contractures Contractures Info: Not present    Additional Factors Info  Code Status, Allergies Code Status Info: Full Code Allergies Info: Codeine           Current Medications (07/26/2021):  This is the current hospital active medication list  Current Facility-Administered Medications  Medication Dose Route Frequency Provider Last Rate Last Admin   acetaminophen (TYLENOL) tablet 650 mg  650 mg Oral Q6H PRN Emokpae, Ejiroghene E, MD   650 mg at 07/25/21 2309   Or   acetaminophen (TYLENOL) suppository 650 mg  650 mg Rectal Q6H PRN Emokpae, Ejiroghene E, MD       albumin human 25 % solution 25 g  25 g Intravenous Q1H PRN Donato Heinz, MD       atorvastatin (LIPITOR) tablet 40 mg  40 mg Oral QHS  Emokpae, Ejiroghene E, MD       budesonide (PULMICORT) nebulizer solution 0.5 mg  0.5 mg Nebulization BID Barton Dubois, MD   0.5 mg at 07/26/21 1002   Chlorhexidine Gluconate Cloth 2 % PADS 6 each  6 each Topical Q0600 Donato Heinz, MD   6 each at 07/26/21 0900   diltiazem (CARDIZEM CD) 24 hr capsule 120 mg  120 mg Oral Daily Emokpae, Ejiroghene E, MD       doxycycline (VIBRA-TABS) tablet 100 mg  100 mg Oral Q12H Emokpae, Ejiroghene E, MD   100 mg at 07/26/21 0834   gabapentin (NEURONTIN) capsule 300 mg  300 mg Oral Daily Emokpae, Ejiroghene E, MD   300 mg at 07/26/21 0834   insulin aspart (novoLOG) injection 0-5 Units  0-5 Units Subcutaneous QHS Emokpae, Ejiroghene E, MD       insulin aspart (novoLOG) injection 0-6 Units  0-6 Units Subcutaneous TID WC Emokpae, Ejiroghene E, MD   1 Units at 07/26/21 1206   ipratropium-albuterol (DUONEB) 0.5-2.5 (3) MG/3ML nebulizer solution 3 mL  3 mL Nebulization Q8H Emokpae, Ejiroghene E, MD   3 mL at 07/26/21 0622   ipratropium-albuterol (DUONEB) 0.5-2.5 (3) MG/3ML nebulizer solution 3 mL  3 mL Nebulization Q4H PRN Emokpae, Ejiroghene E, MD       lidocaine (PF) (XYLOCAINE) 1 % injection 5 mL  5 mL Intradermal PRN Donato Heinz, MD       lidocaine-prilocaine (EMLA) cream 1 application   1 application  Topical PRN Donato Heinz, MD       methylPREDNISolone sodium succinate (SOLU-MEDROL) 125 mg/2 mL injection 60 mg  60 mg Intravenous Q12H Emokpae, Ejiroghene E, MD   60 mg at 07/26/21 0123   Followed by   Derrill Memo ON 07/27/2021] predniSONE (DELTASONE) tablet 40 mg  40 mg Oral Q breakfast Emokpae, Ejiroghene E, MD       metoprolol tartrate (LOPRESSOR) tablet 12.5 mg  12.5 mg Oral BID Emokpae, Ejiroghene E, MD   12.5 mg at 07/25/21 2309   pentafluoroprop-tetrafluoroeth (GEBAUERS) aerosol 1 application   1 application  Topical PRN Donato Heinz, MD       polyethylene glycol (MIRALAX / GLYCOLAX) packet 17 g  17 g Oral Daily PRN Emokpae, Ejiroghene E,  MD       sevelamer carbonate (RENVELA) tablet 800 mg  800 mg Oral TID WC Emokpae, Ejiroghene E, MD   800 mg at 07/26/21 1206     Discharge Medications: Please see discharge summary for a list of discharge medications.  Relevant Imaging Results:  Relevant Lab Results:   Additional Information Dialysis TTS  Daquavion Catala, Clydene Pugh, LCSW

## 2021-07-27 DIAGNOSIS — J441 Chronic obstructive pulmonary disease with (acute) exacerbation: Secondary | ICD-10-CM | POA: Diagnosis not present

## 2021-07-27 LAB — GLUCOSE, CAPILLARY
Glucose-Capillary: 111 mg/dL — ABNORMAL HIGH (ref 70–99)
Glucose-Capillary: 151 mg/dL — ABNORMAL HIGH (ref 70–99)
Glucose-Capillary: 173 mg/dL — ABNORMAL HIGH (ref 70–99)
Glucose-Capillary: 172 mg/dL — ABNORMAL HIGH (ref 70–99)

## 2021-07-27 MED ORDER — IPRATROPIUM-ALBUTEROL 0.5-2.5 (3) MG/3ML IN SOLN
3.0000 mL | Freq: Three times a day (TID) | RESPIRATORY_TRACT | Status: DC
  Administered 2021-07-27 – 2021-07-31 (×11): 3 mL via RESPIRATORY_TRACT
  Filled 2021-07-27 (×11): qty 3

## 2021-07-27 MED ORDER — CHLORHEXIDINE GLUCONATE CLOTH 2 % EX PADS
6.0000 | MEDICATED_PAD | Freq: Every day | CUTANEOUS | Status: DC
  Administered 2021-07-28 – 2021-07-31 (×4): 6 via TOPICAL

## 2021-07-27 MED ORDER — METHYLPREDNISOLONE SODIUM SUCC 40 MG IJ SOLR
40.0000 mg | Freq: Two times a day (BID) | INTRAMUSCULAR | Status: DC
  Administered 2021-07-27 – 2021-07-31 (×9): 40 mg via INTRAVENOUS
  Filled 2021-07-27 (×9): qty 1

## 2021-07-27 MED ORDER — DM-GUAIFENESIN ER 30-600 MG PO TB12
1.0000 | ORAL_TABLET | Freq: Two times a day (BID) | ORAL | Status: DC
  Administered 2021-07-27 – 2021-07-31 (×9): 1 via ORAL
  Filled 2021-07-27 (×9): qty 1

## 2021-07-27 MED ORDER — IPRATROPIUM-ALBUTEROL 0.5-2.5 (3) MG/3ML IN SOLN
RESPIRATORY_TRACT | Status: AC
  Filled 2021-07-27: qty 3

## 2021-07-27 MED ORDER — ALBUTEROL SULFATE (2.5 MG/3ML) 0.083% IN NEBU
2.5000 mg | INHALATION_SOLUTION | RESPIRATORY_TRACT | Status: DC | PRN
  Administered 2021-07-28: 2.5 mg via RESPIRATORY_TRACT
  Filled 2021-07-27: qty 3

## 2021-07-27 NOTE — Progress Notes (Signed)
[facility] NEPHROLOGY PROGRESS NOTE  Assessment/ Plan: Pt is a 81 y.o. yo female with past medical history of hypertension, type II DM, stroke, COPD, HLD, chronic A-fib, osteomyelitis of left foot, ESRD on HD presented with shortness of breath and hypoxia.  We are consulted to manage ESRD.  Dialysis Orders: Center: [facility]  on TTS . EDW 87 Kg HD Bath 2K/2.5Ca  Time 3:15 Heparin none. Access RUE AVF BFR 400 DFR 500    Micera 200 mcg IV every 2 weeks   #COPD exacerbation: Steroid, Doxy and bronchodilators per primary team.  # ESRD TTS: Status post HD yesterday with 2.3 L ultrafiltration.  Plan for regular dialysis tomorrow.  # Anemia: Hemoglobin 9.4.  Unclear when she had last Mircera.  Continue to monitor.  # Secondary hyperparathyroidism: Continue Renvela.  Monitor lab.  # HTN/volume: Blood pressure and volume status acceptable.  Continue current antihypertensives.  Subjective: Seen and examined at bedside.  No new event.  Tolerated dialysis well.  No nausea, vomiting, chest pain or shortness of breath. Objective Vital signs in last 24 hours: Vitals:   07/26/21 2128 07/26/21 2229 07/27/21 0431 07/27/21 0820  BP: 125/75  137/69   Pulse: 73  70   Resp: 19  17   Temp: (!) 97.5 F (36.4 C)  98.4 F (36.9 C)   TempSrc: Oral  Oral   SpO2: 96% (!) 80% 90% 100%  Weight:      Height:       Weight change: -0.32 kg  Intake/Output Summary (Last 24 hours) at 07/27/2021 1014 Last data filed at 07/27/2021 0500 Gross per 24 hour  Intake 420 ml  Output 0 ml  Net 420 ml       Labs: RENAL PANEL Recent Labs    03/05/21 0411 03/06/21 0556 03/07/21 0425 03/17/21 1616 04/07/21 0222 04/08/21 0714 04/09/21 0165 04/11/21 0232 04/14/21 0726 05/31/21 1236 05/31/21 1612 06/01/21 0420 06/03/21 5374 06/03/21 1853 06/04/21 0508 06/04/21 0905 06/05/21 0619 06/06/21 0403 06/07/21 1221 07/02/21 1213 07/03/21 0526 07/25/21 1552 07/25/21 1725  NA 133*  134* 131*   < > 132*   < > 133*   < > 135 135 133*   < > 134* 133* 133* 132* 135 136 133* 139 139 137  --   K 4.2 3.7 4.2   < > 3.8   < > 4.1   < > 4.0 4.2 4.2   < > 4.2 4.0 3.7 3.8 4.1 3.4* 3.9 4.2 3.7 4.3  --   CL 95* 95* 92*   < > 97*   < > 97*   < > 96* 96* 95*   < > 95* 94* 96* 95* 97* 96* 95* 100 100 102  --   CO2 '24 27 25   '$ < > 27   < > 26   < > '28 28 29   '$ < > '28 26 24 25 28 27 27 29 '$ 32 27  --   GLUCOSE 100* 106* 93   < > 96   < > 95   < > 95 106* 105*   < > 167* 248* 187* 186* 155* 122* 167* 141* 130* 133*  --   BUN 49* 28* 31*   < > 26*   < > 28*   < > 23 50* 54*   < > 46* 50* 56* 58* 35* 46* 65* 32* 25* 41*  --   CREATININE 7.64* 4.52* 5.48*   < > 4.25*   < >  4.21*   < > 4.26* 4.42* 4.74*   < > 3.65* 3.88* 4.05* 4.18* 2.71* 3.43* 4.39* 4.11* 3.30* 4.40*  --   CALCIUM 8.7* 8.7* 8.9   < > 9.4   < > 9.5   < > 9.3 9.9 9.4   < > 10.0 9.7 10.1 9.9 9.6 9.7 9.8 9.7 9.1 10.3  --   MG  --  2.1 2.2  --  2.2  --   --   --   --   --   --   --   --  2.5* 2.4  --   --  2.6*  --   --   --   --  3.0*  PHOS 8.5* 4.9* 6.1*  --   --   --  4.3  --  3.6  --  5.5*  --  4.3 4.3 2.6  --   --   --  4.8*  --   --   --   --   ALBUMIN 2.8*  --   --    < >  --   --  2.5*  --  2.4* 3.7 3.2*  --  4.2 4.1 3.7 3.6 3.6 3.8 3.8  --   --   --   --    < > = values in this interval not displayed.     Liver Function Tests: No results for input(s): "AST", "ALT", "ALKPHOS", "BILITOT", "PROT", "ALBUMIN" in the last 168 hours. No results for input(s): "LIPASE", "AMYLASE" in the last 168 hours. No results for input(s): "AMMONIA" in the last 168 hours. CBC: Recent Labs    04/07/21 0222 04/08/21 0714 06/02/21 0415 06/03/21 1853 06/06/21 0403 06/07/21 1221 07/02/21 1213 07/03/21 0526 07/25/21 1552  HGB 7.7*   < > 8.0*   < > 9.3* 8.5* 10.1* 9.4* 9.4*  MCV 108.9*   < > 112.6*   < > 112.6* 110.8* 114.7* 115.2* 111.8*  VITAMINB12 358  --  1,435*  --   --   --   --   --   --   FOLATE  --   --  7.0  --   --   --   --   --   --     < > = values in this interval not displayed.    Cardiac Enzymes: No results for input(s): "CKTOTAL", "CKMB", "CKMBINDEX", "TROPONINI" in the last 168 hours. CBG: Recent Labs  Lab 07/25/21 2110 07/26/21 0726 07/26/21 1115 07/26/21 2134 07/27/21 0734  GLUCAP 162* 158* 169* 132* 111*    Iron Studies: No results for input(s): "IRON", "TIBC", "TRANSFERRIN", "FERRITIN" in the last 72 hours. Studies/Results: DG Chest Port 1 View  Result Date: 07/25/2021 CLINICAL DATA:  Provided history: Shortness of breath since this morning. History of COPD, former smoker. EXAM: PORTABLE CHEST 1 VIEW COMPARISON:  Prior chest radiographs 07/03/2021 and earlier. FINDINGS: Shallow inspiration radiograph. Cardiomegaly. Aortic atherosclerosis. Central pulmonary vascular congestion without overt pulmonary edema. No definite airspace consolidation. No sizable pleural effusion or evidence of pneumothorax. No acute bony abnormality identified. Degenerative changes of the spine. IMPRESSION: Shallow inspiration radiograph. Cardiomegaly with central pulmonary vascular congestion. No overt pulmonary edema. Aortic Atherosclerosis (ICD10-I70.0). Electronically Signed   By: Kellie Simmering D.O.   On: 07/25/2021 15:48    Medications: Infusions:  albumin human      Scheduled Medications:  atorvastatin  40 mg Oral QHS   budesonide (PULMICORT) nebulizer solution  0.5 mg Nebulization BID   Chlorhexidine Gluconate Cloth  6 each Topical Q0600   dextromethorphan-guaiFENesin  1 tablet Oral BID   diltiazem  120 mg Oral Daily   doxycycline  100 mg Oral Q12H   gabapentin  300 mg Oral Daily   insulin aspart  0-5 Units Subcutaneous QHS   insulin aspart  0-6 Units Subcutaneous TID WC   ipratropium-albuterol  3 mL Nebulization TID   methylPREDNISolone (SOLU-MEDROL) injection  40 mg Intravenous Q12H   metoprolol tartrate  12.5 mg Oral BID   sevelamer carbonate  800 mg Oral TID WC    have reviewed scheduled and prn  medications.  Physical Exam: General:NAD, comfortable Heart:RRR, s1s2 nl Lungs:clear b/l, no crackle Abdomen:soft, Non-tender, non-distended Extremities:No edema Dialysis Access: AV fistula.  Shawniece Oyola Prasad Fia Hebert 07/27/2021,10:14 AM  LOS: 1 day

## 2021-07-27 NOTE — Progress Notes (Signed)
Progress Note   Patient: Jody [name] FWY:[id number] DOB: May 27, 1940 DOA: 07/25/2021     1 DOS: the patient was seen and examined on 07/27/2021   Brief hospital course: As per H&P written by Dr. Denton [name] on 07/25/2021 Jody [name] is a 81 y.o. female with medical history significant for atrial fibrillation, ESRD, systolic and diastolic CHF, hypertension, diabetes mellitus, COPD with chronic respiratory failure. Patient presented to the ED with complaints of difficulty breathing that started today.  She denies cough.  No chest pain.  No lower extremity swelling.  She is on 2  - 3 L of oxygen at baseline.   Recent hospitalization 5/20 through 5/22 for chest pains, troponins EKG unremarkable.  Dialyzed during hospitalization and discharged.   ED Course: O2 sats intermittently dropping to 89% on 4 L, Tmax 99.2.  Heart rate mostly 60s to 108.  Respiratory rate 19-24.  Blood pressure systolic 277-412. BNP 1783.  Chest x-ray showing vascular congestion without pulmonary edema.  Troponin 34 > 35. Solu-Medrol '80mg'$  given.  DuoNebs given.  Hospitalist to admit for COPD exacerbation.   Assessment and Plan: * COPD with acute exacerbation ([place]) -Dyspnea, hypoxia and increased work of breathing is not worse -Continue steroids, bronchodilators and doxycycline -Currently requiring 3 L of oxygen via nasal cannula  Persistent atrial fibrillation ([place]) -EKG/telemetry showing atrial fibrillation.   -Continue metoprolol and Cardizem for rate control --Eliquis held due to thrombocytopenia that is worsening  Chronic combined systolic and diastolic CHF (congestive heart failure) ([place]) -Chest x-ray demonstrated central pulmonary vascular congestion at time of admission -BNP elevated at 1783 and patient complaining of orthopnea and short winded sensation. -Continue to follow daily weights, strict intake and output and low-sodium diet. -Hypoxia persist -Continue to use hemodialysis to address volume  status  ESRD on dialysis [facility]) -Schedule Tuesday Thursday Saturday.   -chest x-ray suggesting pulmonary vascular congestion  HD per nephrology service   Thrombocytopenia ([place]) -Platelets down to 67K.   -no overt bleeding -holding eliquis currently -receiving steroids as part of tx for COPD exacerbation. -SCD's for DVT prophylaxis initiated. -per chart review patient with mild chronic thrombocytopenia with platelets count in 120-130: range before.   Class 1 obesity -Body mass index is 31.32 kg/m.  -low calorie diet and portion control discussed with patient.  Chronic respiratory failure (HCC) -On 2 to 3 L at baseline. -Oxygen requirement is not worse -Actually may be improving post HD   Type 2 diabetes mellitus with nephropathy ([place]) -chronically on HD. -Not on medications for diabetes currently; following diet control only -will continue SSI while inpatient and follow CBG's fluctuation.   Essential hypertension -stable overall -continue current antihypertensive regimen        Subjective:  Dyspnea and hypoxia is not worse -Symptoms appear to be slowly improving post HD -  Physical Exam: Vitals:   07/27/21 0431 07/27/21 0820 07/27/21 1207 07/27/21 1441  BP: 137/69  121/69   Pulse: 70  72   Resp: 17  20   Temp: 98.4 F (36.9 C)  98.1 F (36.7 C)   TempSrc: Oral  Axillary   SpO2: 90% 100% 100% 98%  Weight:      Height:        Physical Exam  Gen:- Awake Alert, in no acute distress  HEENT:- [place].AT, No sclera icterus Nose- [place] 3L/min Neck-Supple Neck,No JVD,.  Lungs-diminished in bases, no wheezing  CV- S1, S2 normal, RRR Abd-  +ve B.Sounds, Abd Soft, No tenderness,    Extremity/Skin:- No  edema,  good pedal pulses  Psych-affect is appropriate, oriented x3 Neuro-no new focal deficits, no tremors MSK-right arm AV fistula with positive thrill and bruit  Data Reviewed:     Family Communication: No family at bedside.  Disposition: Status is:  Inpatient     Planned Discharge Destination: Home   Author: Roxan Hockey, MD 07/27/2021 4:41 PM  For on call review [email or web address].

## 2021-07-28 ENCOUNTER — Inpatient Hospital Stay (HOSPITAL_COMMUNITY): Payer: Medicare Other

## 2021-07-28 LAB — GLUCOSE, CAPILLARY
Glucose-Capillary: 175 mg/dL — ABNORMAL HIGH (ref 70–99)
Glucose-Capillary: 173 mg/dL — ABNORMAL HIGH (ref 70–99)
Glucose-Capillary: 218 mg/dL — ABNORMAL HIGH (ref 70–99)
Glucose-Capillary: 175 mg/dL — ABNORMAL HIGH (ref 70–99)

## 2021-07-28 LAB — RENAL FUNCTION PANEL
Albumin: 4 g/dL (ref 3.5–5.0)
Anion gap: 10 (ref 5–15)
BUN: 54 mg/dL — ABNORMAL HIGH (ref 8–23)
CO2: 25 mmol/L (ref 22–32)
Calcium: 10.1 mg/dL (ref 8.9–10.3)
Chloride: 97 mmol/L — ABNORMAL LOW (ref 98–111)
Creatinine, Ser: 4.08 mg/dL — ABNORMAL HIGH (ref 0.44–1.00)
GFR, Estimated: 10 mL/min — ABNORMAL LOW (ref >60)
Glucose, Bld: 170 mg/dL — ABNORMAL HIGH (ref 70–99)
Phosphorus: 4.5 mg/dL (ref 2.5–4.6)
Potassium: 4.4 mmol/L (ref 3.5–5.1)
Sodium: 132 mmol/L — ABNORMAL LOW (ref 135–145)

## 2021-07-28 LAB — CBC
HCT: 35.1 % — ABNORMAL LOW (ref 36.0–46.0)
Hemoglobin: 10.3 g/dL — ABNORMAL LOW (ref 12.0–15.0)
MCH: 32.5 pg (ref 26.0–34.0)
MCHC: 29.3 g/dL — ABNORMAL LOW (ref 30.0–36.0)
MCV: 110.7 fL — ABNORMAL HIGH (ref 80.0–100.0)
Platelets: 96 10*3/uL — ABNORMAL LOW (ref 150–400)
RBC: 3.17 MIL/uL — ABNORMAL LOW (ref 3.87–5.11)
RDW: 17.5 % — ABNORMAL HIGH (ref 11.5–15.5)
WBC: 5.1 10*3/uL (ref 4.0–10.5)
nRBC: 0.8 % — ABNORMAL HIGH (ref 0.0–0.2)

## 2021-07-28 MED ORDER — DILTIAZEM HCL ER COATED BEADS 120 MG PO CP24: 120.0000 mg | ORAL_CAPSULE | Freq: Every day | ORAL | Status: DC

## 2021-07-28 MED ORDER — METOPROLOL TARTRATE 25 MG PO TABS: 12.5000 mg | ORAL_TABLET | Freq: Two times a day (BID) | ORAL | Status: DC

## 2021-07-28 NOTE — Progress Notes (Addendum)
Patient arrived on the dialysis unit at 1150.  There was a delay in start of treatment related to the heat cycle of the Tablo machine.

## 2021-07-28 NOTE — Progress Notes (Addendum)
HD Note Received patient in bed, alert and oriented. Informed consent signed and in chart. Patient did not tolerate treatment well as evidenced by low pulse and BP throughout the treatment.  After discussion with Dr. Maurene Capes, treatment was discontinued after two hours.  Per Dr Carolin Sicks, will attempt tomorrow morning.   Patient transported back to the room, alert and orient and in no acute distress. Report given to bedside RN.  Total UF removed:159m

## 2021-07-28 NOTE — Progress Notes (Addendum)
Progress Note   Patient: Jody [name] VOH:[id number] DOB: February 02, 1941 DOA: 07/25/2021     2 DOS: the patient was seen and examined on 07/28/2021   Brief hospital course: As per H&P written by Dr. Denton Brick on 07/25/2021 Jody [name] is a 81 y.o. female with medical history significant for atrial fibrillation, ESRD, systolic and diastolic CHF, hypertension, diabetes mellitus, COPD with chronic respiratory failure. Patient presented to the ED with complaints of difficulty breathing that started today.  She denies cough.  No chest pain.  No lower extremity swelling.  She is on 2  - 3 L of oxygen at baseline.   Recent hospitalization 5/20 through 5/22 for chest pains, troponins EKG unremarkable.  Dialyzed during hospitalization and discharged.   ED Course: O2 sats intermittently dropping to 89% on 4 L, Tmax 99.2.  Heart rate mostly 60s to 108.  Respiratory rate 19-24.  Blood pressure systolic 694-854. BNP 1783.  Chest x-ray showing vascular congestion without pulmonary edema.  Troponin 34 > 35. Solu-Medrol '80mg'$  given.  DuoNebs given.  Hospitalist to admit for COPD exacerbation.   Assessment and Plan: * COPD with acute exacerbation ([place]) -Dyspnea, cough and hypoxia persist -Continue steroids, bronchodilators and doxycycline -Currently requiring 3 L of oxygen via nasal cannula  Persistent atrial fibrillation (HCC) -EKG/telemetry showing atrial fibrillation.   -07/28/21---Heart rate dipping into the high 30s and low 40s in the hemodialysis unit with soft blood pressure -Patient already received metoprolol and Cardizem today -We will hold further Metoprolol and Cardizem for the next 24 hours --Eliquis held due to thrombocytopenia that is worsening  Acute on Chronic combined systolic and diastolic CHF (congestive heart failure)  -Chest x-ray demonstrated central pulmonary vascular congestion at time of admission -BNP elevated at 1783 and patient complaining of orthopnea and short winded  sensation. -Continue to follow daily weights, strict intake and output and low-sodium diet. 07/28/21 -Hypoxia and dyspnea persist -Unable to pull fluid today at HD session due to soft BP and bradycardia -Continue to use hemodialysis to address volume status  ESRD on dialysis [facility]) -Schedule Tuesday Thursday Saturday.   -chest x-ray suggesting pulmonary vascular congestion  HD per nephrology service -just saw her again in HD unit with RN Levada Dy HR dropping into high 30s and low 40s---will Stop further HD today since we are unable to pull off any fluid today anyway--lets try HD again tomorrow on 07/29/2021 off schedule and hold BP Meds in am prior to HD   Thrombocytopenia ([place]) -Platelets down to 67K.   -no overt bleeding -holding eliquis currently -receiving steroids as part of tx for COPD exacerbation. -SCD's for DVT prophylaxis initiated. -per chart review patient with mild chronic thrombocytopenia with platelets count in 120-130: range before.   Class 1 obesity -Body mass index is 31.32 kg/m.  -low calorie diet and portion control discussed with patient.  Acute on chronic hypoxic respiratory failure ([place]) -On 2 to 3 L at baseline. Dyspnea and hypoxia persist   Type 2 diabetes mellitus with nephropathy ([place]) -chronically on HD. -Not on medications for diabetes currently; following diet control only -will continue SSI while inpatient and follow CBG's fluctuation.   Essential hypertension -stable overall 07/28/21----Soft blood pressure and bradycardia is precluding hemodialysis on 07/28/2021    Subjective:  -Patient evaluated earlier on the floor, just saw her again in HD unit with RN Levada Dy HR dropping into high 30s and low 40s---will Stop further HD today since we are unable to pull off any fluid today anyway--lets try HD again tomorrow  off schedule and hold BP Meds in am prior to HD -  Physical Exam: Vitals:   07/28/21 1350 07/28/21 1401 07/28/21 1430 07/28/21 1458   BP: 103/68 95/61 (!) 96/53 (!) 96/53  Pulse: 62 (!) 57  (!) 54  Resp: 17 19 (!) 28 19  Temp:      TempSrc:      SpO2: 93%     Weight:      Height:       Physical Exam  Gen:- Awake Alert, in no acute distress  HEENT:- [place].AT, No sclera icterus Nose- [place] 3L/min Neck-Supple Neck, +ve  JVD,.  Lungs-diminished in bases, no wheezing  CV- S1, S2 normal, RRR, bradycardic Abd-  +ve B.Sounds, Abd Soft, No tenderness,    Extremity/Skin:- No  edema,   good pedal pulses  Psych-affect is appropriate, oriented x3 Neuro-Generalized weakness, No new focal deficits, no tremors MSK-right arm AV fistula with positive thrill and bruit  Data Reviewed:     Family Communication: No family at bedside.  Disposition:SNF  Status is: Inpatient     Planned Discharge Destination: SNF with Oxygen    Author: Roxan Hockey, MD 07/28/2021 3:00 PM  For on call review [email or web address].

## 2021-07-28 NOTE — Care Management Important Message (Signed)
Important Message  Patient Details  [name] MRN: [id number] Date of Birth: [birth year]   Medicare Important Message Given:  Yes (copy left in room with belongings)     [name] 07/28/2021, 12:09 PM

## 2021-07-28 NOTE — Progress Notes (Signed)
[facility] NEPHROLOGY PROGRESS NOTE  Assessment/ Plan: Pt is a 81 y.o. yo female with past medical history of hypertension, type II DM, stroke, COPD, HLD, chronic A-fib, osteomyelitis of left foot, ESRD on HD presented with shortness of breath and hypoxia.  We are consulted to manage ESRD.  Dialysis Orders: Center: [facility]  on TTS . EDW 87 Kg HD Bath 2K/2.5Ca  Time 3:15 Heparin none. Access RUE AVF BFR 400 DFR 500    Micera 200 mcg IV every 2 weeks   #COPD exacerbation: Steroid, Doxy and bronchodilators per primary team.  Respiratory status looks stable.  # ESRD TTS: Plan for dialysis today.  # Anemia: Hemoglobin 10.3.  Continue Mircera as outpatient.  # Secondary hyperparathyroidism: Continue Renvela.  Phosphorus at goal.  Monitor lab.  # HTN/volume: Blood pressure and volume status acceptable.  Continue current antihypertensives.  Discussed with the primary team, possible discharge today after dialysis.  Subjective: Seen and examined at bedside.  Denies nausea, vomiting, chest pain, shortness of breath.  No new event. Objective Vital signs in last 24 hours: Vitals:   07/28/21 0346 07/28/21 0430 07/28/21 0829 07/28/21 0830  BP: 121/73     Pulse: 81     Resp: 16     Temp: 98.6 F (37 C)     TempSrc: Oral     SpO2: 98% 93% 99% 100%  Weight:      Height:       Weight change:   Intake/Output Summary (Last 24 hours) at 07/28/2021 0918 Last data filed at 07/28/2021 0500 Gross per 24 hour  Intake 920 ml  Output 0 ml  Net 920 ml        Labs: RENAL PANEL Recent Labs    03/06/21 0556 03/07/21 0425 03/17/21 1616 04/07/21 0222 04/08/21 0714 04/09/21 9381 04/11/21 0232 04/14/21 0726 05/31/21 1236 05/31/21 1612 06/01/21 0420 06/03/21 0175 06/03/21 1853 06/04/21 0508 06/04/21 1025 06/05/21 8527 06/06/21 0403 06/07/21 1221 07/02/21 1213 07/03/21 0526 07/25/21 1552 07/25/21 1725 07/28/21 0555  NA 134* 131*   < > 132*   < > 133*    < > 135 135 133*   < > 134* 133* 133* 132* 135 136 133* 139 139 137  --  132*  K 3.7 4.2   < > 3.8   < > 4.1   < > 4.0 4.2 4.2   < > 4.2 4.0 3.7 3.8 4.1 3.4* 3.9 4.2 3.7 4.3  --  4.4  CL 95* 92*   < > 97*   < > 97*   < > 96* 96* 95*   < > 95* 94* 96* 95* 97* 96* 95* 100 100 102  --  97*  CO2 27 25   < > 27   < > 26   < > '28 28 29   '$ < > '28 26 24 25 28 27 27 29 '$ 32 27  --  25  GLUCOSE 106* 93   < > 96   < > 95   < > 95 106* 105*   < > 167* 248* 187* 186* 155* 122* 167* 141* 130* 133*  --  170*  BUN 28* 31*   < > 26*   < > 28*   < > 23 50* 54*   < > 46* 50* 56* 58* 35* 46* 65* 32* 25* 41*  --  54*  CREATININE 4.52* 5.48*   < > 4.25*   < > 4.21*   < > 4.26*  4.42* 4.74*   < > 3.65* 3.88* 4.05* 4.18* 2.71* 3.43* 4.39* 4.11* 3.30* 4.40*  --  4.08*  CALCIUM 8.7* 8.9   < > 9.4   < > 9.5   < > 9.3 9.9 9.4   < > 10.0 9.7 10.1 9.9 9.6 9.7 9.8 9.7 9.1 10.3  --  10.1  MG 2.1 2.2  --  2.2  --   --   --   --   --   --   --   --  2.5* 2.4  --   --  2.6*  --   --   --   --  3.0*  --   PHOS 4.9* 6.1*  --   --   --  4.3  --  3.6  --  5.5*  --  4.3 4.3 2.6  --   --   --  4.8*  --   --   --   --  4.5  ALBUMIN  --   --    < >  --   --  2.5*  --  2.4* 3.7 3.2*  --  4.2 4.1 3.7 3.6 3.6 3.8 3.8  --   --   --   --  4.0   < > = values in this interval not displayed.      Liver Function Tests: Recent Labs  Lab 07/28/21 0555  ALBUMIN 4.0   No results for input(s): "LIPASE", "AMYLASE" in the last 168 hours. No results for input(s): "AMMONIA" in the last 168 hours. CBC: Recent Labs    04/07/21 0222 04/08/21 0714 06/02/21 0415 06/03/21 1853 06/07/21 1221 07/02/21 1213 07/03/21 0526 07/25/21 1552 07/28/21 0555  HGB 7.7*   < > 8.0*   < > 8.5* 10.1* 9.4* 9.4* 10.3*  MCV 108.9*   < > 112.6*   < > 110.8* 114.7* 115.2* 111.8* 110.7*  VITAMINB12 358  --  1,435*  --   --   --   --   --   --   FOLATE  --   --  7.0  --   --   --   --   --   --    < > = values in this interval not displayed.     Cardiac Enzymes: No  results for input(s): "CKTOTAL", "CKMB", "CKMBINDEX", "TROPONINI" in the last 168 hours. CBG: Recent Labs  Lab 07/27/21 0734 07/27/21 1059 07/27/21 1622 07/27/21 2136 07/28/21 0714  GLUCAP 111* 151* 173* 172* 175*     Iron Studies: No results for input(s): "IRON", "TIBC", "TRANSFERRIN", "FERRITIN" in the last 72 hours. Studies/Results: DG Chest Port 1 View  Result Date: 07/28/2021 CLINICAL DATA:  Shortness of breath EXAM: PORTABLE CHEST 1 VIEW COMPARISON:  Chest x-ray dated July 25, 2021 FINDINGS: Unchanged cardiac and mediastinal contours. Low lung volumes. Similar bibasilar opacities which are likely due to atelectasis. Possible small layering right pleural effusion. No evidence of pneumothorax. IMPRESSION: 1. Low lung volumes with bibasilar opacities which are likely due to atelectasis. 2. Possible small layering right pleural effusion. Electronically Signed   By: Yetta Glassman M.D.   On: 07/28/2021 08:53    Medications: Infusions:  albumin human      Scheduled Medications:  atorvastatin  40 mg Oral QHS   budesonide (PULMICORT) nebulizer solution  0.5 mg Nebulization BID   Chlorhexidine Gluconate Cloth  6 each Topical Q0600   Chlorhexidine Gluconate Cloth  6 each Topical Q0600   dextromethorphan-guaiFENesin  1 tablet Oral  BID   diltiazem  120 mg Oral Daily   doxycycline  100 mg Oral Q12H   gabapentin  300 mg Oral Daily   insulin aspart  0-5 Units Subcutaneous QHS   insulin aspart  0-6 Units Subcutaneous TID WC   ipratropium-albuterol  3 mL Nebulization TID   methylPREDNISolone (SOLU-MEDROL) injection  40 mg Intravenous Q12H   metoprolol tartrate  12.5 mg Oral BID   sevelamer carbonate  800 mg Oral TID WC    have reviewed scheduled and prn medications.  Physical Exam: General:NAD, comfortable Heart:RRR, s1s2 nl Lungs: Clear b/l, no crackle Abdomen:soft, Non-tender, non-distended Extremities:No edema Dialysis Access: AV fistula.  Yale Golla Reesa Chew  Rudolpho Claxton 07/28/2021,9:18 AM  LOS: 2 days

## 2021-07-29 LAB — CBC
HCT: 31.2 % — ABNORMAL LOW (ref 36.0–46.0)
Hemoglobin: 9.5 g/dL — ABNORMAL LOW (ref 12.0–15.0)
MCH: 32.6 pg (ref 26.0–34.0)
MCHC: 30.4 g/dL (ref 30.0–36.0)
MCV: 107.2 fL — ABNORMAL HIGH (ref 80.0–100.0)
Platelets: 93 10*3/uL — ABNORMAL LOW (ref 150–400)
RBC: 2.91 MIL/uL — ABNORMAL LOW (ref 3.87–5.11)
RDW: 17.9 % — ABNORMAL HIGH (ref 11.5–15.5)
WBC: 3.9 10*3/uL — ABNORMAL LOW (ref 4.0–10.5)
nRBC: 0.8 % — ABNORMAL HIGH (ref 0.0–0.2)

## 2021-07-29 LAB — RENAL FUNCTION PANEL
Albumin: 3.7 g/dL (ref 3.5–5.0)
Anion gap: 13 (ref 5–15)
BUN: 58 mg/dL — ABNORMAL HIGH (ref 8–23)
CO2: 24 mmol/L (ref 22–32)
Calcium: 9.8 mg/dL (ref 8.9–10.3)
Chloride: 96 mmol/L — ABNORMAL LOW (ref 98–111)
Creatinine, Ser: 4.06 mg/dL — ABNORMAL HIGH (ref 0.44–1.00)
GFR, Estimated: 10 mL/min — ABNORMAL LOW (ref >60)
Glucose, Bld: 180 mg/dL — ABNORMAL HIGH (ref 70–99)
Phosphorus: 4.3 mg/dL (ref 2.5–4.6)
Potassium: 4.7 mmol/L (ref 3.5–5.1)
Sodium: 133 mmol/L — ABNORMAL LOW (ref 135–145)

## 2021-07-29 LAB — HEPATITIS B SURFACE ANTIBODY,QUALITATIVE: Hep B S Ab: REACTIVE — AB

## 2021-07-29 LAB — HEPATITIS B SURFACE ANTIGEN: Hepatitis B Surface Ag: NONREACTIVE

## 2021-07-29 LAB — GLUCOSE, CAPILLARY
Glucose-Capillary: 174 mg/dL — ABNORMAL HIGH (ref 70–99)
Glucose-Capillary: 204 mg/dL — ABNORMAL HIGH (ref 70–99)
Glucose-Capillary: 201 mg/dL — ABNORMAL HIGH (ref 70–99)

## 2021-07-29 LAB — HEPATITIS B CORE ANTIBODY, TOTAL: Hep B Core Total Ab: NONREACTIVE

## 2021-07-29 LAB — HEPATITIS C ANTIBODY: HCV Ab: NONREACTIVE

## 2021-07-29 MED ORDER — LOPERAMIDE HCL 2 MG PO CAPS: 2.0000 mg | ORAL_CAPSULE | Freq: Four times a day (QID) | ORAL | Status: DC | PRN

## 2021-07-29 MED ORDER — PENTAFLUOROPROP-TETRAFLUOROETH EX AERO
INHALATION_SPRAY | CUTANEOUS | Status: AC
  Administered 2021-07-29: 1 via TOPICAL
  Filled 2021-07-29: qty 116

## 2021-07-29 MED ORDER — CHLORHEXIDINE GLUCONATE CLOTH 2 % EX PADS
6.0000 | MEDICATED_PAD | Freq: Every day | CUTANEOUS | Status: DC
  Administered 2021-07-30 – 2021-07-31 (×2): 6 via TOPICAL

## 2021-07-29 MED ORDER — METOPROLOL TARTRATE 25 MG PO TABS
12.5000 mg | ORAL_TABLET | Freq: Two times a day (BID) | ORAL | Status: DC
  Administered 2021-07-31: 12.5 mg via ORAL
  Filled 2021-07-29: qty 1

## 2021-07-29 MED ORDER — NEPRO/CARBSTEADY PO LIQD
ORAL | Status: AC
  Administered 2021-07-29: 237 mL via ORAL
  Filled 2021-07-29: qty 237

## 2021-07-29 MED ORDER — CHLORHEXIDINE GLUCONATE CLOTH 2 % EX PADS
6.0000 | MEDICATED_PAD | Freq: Every day | CUTANEOUS | Status: DC
  Administered 2021-07-29 – 2021-07-31 (×3): 6 via TOPICAL

## 2021-07-29 MED ORDER — DILTIAZEM HCL ER COATED BEADS 120 MG PO CP24
120.0000 mg | ORAL_CAPSULE | Freq: Every day | ORAL | Status: DC
  Administered 2021-07-31: 120 mg via ORAL
  Filled 2021-07-29: qty 1

## 2021-07-29 NOTE — Progress Notes (Addendum)
[name] NE[facility] PROGRESS NOTE  Assessment/ Plan: Pt is a 81 y.o. yo female with past medical history of hypertension, type II DM, stroke, COPD, HLD, chronic A-fib, osteomyelitis of left foot, ESRD on HD presented with shortness of breath and hypoxia.  We are consulted to manage ESRD.  Dialysis Orders: Center: [facility]  on TTS . EDW 87 Kg HD Bath 2K/2.5Ca  Time 3:15 Heparin none. Access RUE AVF BFR 400 DFR 500    Micera 200 mcg IV every 2 weeks   #COPD exacerbation: Steroid, Doxy and bronchodilators per primary team.  We will try to UF during dialysis today.  # ESRD TTS: Limited HD yesterday because of bradycardia and hypotension.  Unable to UF.  We will try extra dialysis today mainly for ultrafiltration.  Her oxygen requirement has gone up.  Regular HD tomorrow if she remains in the hospital.  # Anemia: Hemoglobin 10.3.  Continue Mircera as outpatient.  # Secondary hyperparathyroidism: Continue Renvela.  Phosphorus at goal.  Monitor lab.  # HTN/volume: Recommend to reduce diltiazem dose for bradycardia.  Heart rate and blood pressure acceptable today.  #Hyponatremia, hypervolemic: UF during HD.  Please contact on-call nephrologist over the weekend with any questions.  Subjective: Seen and examined at bedside.  Event noted from dialysis yesterday.  Using around 3 L of oxygen.  Feels weak.  No nausea, vomiting, chest pain. Objective Vital signs in last 24 hours: Vitals:   07/28/21 2031 07/28/21 2031 07/29/21 0338 07/29/21 0848  BP:  121/68 136/83   Pulse: 77 68 79   Resp: '18 17 16   '$ Temp:  98.4 F (36.9 C) 98 F (36.7 C)   TempSrc:  Oral Oral   SpO2: 97%  100% 91%  Weight:      Height:       Weight change:   Intake/Output Summary (Last 24 hours) at 07/29/2021 1053 Last data filed at 07/29/2021 0900 Gross per 24 hour  Intake 1088 ml  Output 0.1 ml  Net 1087.9 ml        Labs: RENAL PANEL Recent Labs    03/06/21 0556 03/07/21 0425  03/17/21 1616 04/07/21 0222 04/08/21 0714 04/09/21 2751 04/11/21 0232 04/14/21 0726 05/31/21 1236 05/31/21 1612 06/01/21 0420 06/03/21 7001 06/03/21 1853 06/04/21 7494 06/04/21 4967 06/05/21 5916 06/06/21 0403 06/07/21 1221 07/02/21 1213 07/03/21 0526 07/25/21 1552 07/25/21 1725 07/28/21 0555 07/29/21 0435  NA 134* 131*   < > 132*   < > 133*   < > 135   < > 133*   < > 134* 133* 133* 132* 135 136 133* 139 139 137  --  132* 133*  K 3.7 4.2   < > 3.8   < > 4.1   < > 4.0   < > 4.2   < > 4.2 4.0 3.7 3.8 4.1 3.4* 3.9 4.2 3.7 4.3  --  4.4 4.7  CL 95* 92*   < > 97*   < > 97*   < > 96*   < > 95*   < > 95* 94* 96* 95* 97* 96* 95* 100 100 102  --  97* 96*  CO2 27 25   < > 27   < > 26   < > 28   < > 29   < > '28 26 24 25 28 27 27 29 '$ 32 27  --  25 24  GLUCOSE 106* 93   < > 96   < > 95   < >  95   < > 105*   < > 167* 248* 187* 186* 155* 122* 167* 141* 130* 133*  --  170* 180*  BUN 28* 31*   < > 26*   < > 28*   < > 23   < > 54*   < > 46* 50* 56* 58* 35* 46* 65* 32* 25* 41*  --  54* 58*  CREATININE 4.52* 5.48*   < > 4.25*   < > 4.21*   < > 4.26*   < > 4.74*   < > 3.65* 3.88* 4.05* 4.18* 2.71* 3.43* 4.39* 4.11* 3.30* 4.40*  --  4.08* 4.06*  CALCIUM 8.7* 8.9   < > 9.4   < > 9.5   < > 9.3   < > 9.4   < > 10.0 9.7 10.1 9.9 9.6 9.7 9.8 9.7 9.1 10.3  --  10.1 9.8  MG 2.1 2.2  --  2.2  --   --   --   --   --   --   --   --  2.5* 2.4  --   --  2.6*  --   --   --   --  3.0*  --   --   PHOS 4.9* 6.1*  --   --   --  4.3  --  3.6  --  5.5*  --  4.3 4.3 2.6  --   --   --  4.8*  --   --   --   --  4.5 4.3  ALBUMIN  --   --    < >  --   --  2.5*  --  2.4*   < > 3.2*  --  4.2 4.1 3.7 3.6 3.6 3.8 3.8  --   --   --   --  4.0 3.7   < > = values in this interval not displayed.      Liver Function Tests: Recent Labs  Lab 07/28/21 0555 07/29/21 0435  ALBUMIN 4.0 3.7    No results for input(s): "LIPASE", "AMYLASE" in the last 168 hours. No results for input(s): "AMMONIA" in the last 168 hours. CBC: Recent  Labs    04/07/21 0222 04/08/21 0714 06/02/21 0415 06/03/21 1853 06/07/21 1221 07/02/21 1213 07/03/21 0526 07/25/21 1552 07/28/21 0555  HGB 7.7*   < > 8.0*   < > 8.5* 10.1* 9.4* 9.4* 10.3*  MCV 108.9*   < > 112.6*   < > 110.8* 114.7* 115.2* 111.8* 110.7*  VITAMINB12 358  --  1,435*  --   --   --   --   --   --   FOLATE  --   --  7.0  --   --   --   --   --   --    < > = values in this interval not displayed.     Cardiac Enzymes: No results for input(s): "CKTOTAL", "CKMB", "CKMBINDEX", "TROPONINI" in the last 168 hours. CBG: Recent Labs  Lab 07/28/21 0714 07/28/21 1109 07/28/21 1616 07/28/21 2035 07/29/21 0709  GLUCAP 175* 173* 218* 175* 174*     Iron Studies: No results for input(s): "IRON", "TIBC", "TRANSFERRIN", "FERRITIN" in the last 72 hours. Studies/Results: DG Chest Port 1 View  Result Date: 07/28/2021 CLINICAL DATA:  Shortness of breath EXAM: PORTABLE CHEST 1 VIEW COMPARISON:  Chest x-ray dated July 25, 2021 FINDINGS: Unchanged cardiac and mediastinal contours. Low lung volumes. Similar bibasilar opacities which are likely due  to atelectasis. Possible small layering right pleural effusion. No evidence of pneumothorax. IMPRESSION: 1. Low lung volumes with bibasilar opacities which are likely due to atelectasis. 2. Possible small layering right pleural effusion. Electronically Signed   By: Yetta Glassman M.D.   On: 07/28/2021 08:53    Medications: Infusions:  albumin human      Scheduled Medications:  atorvastatin  40 mg Oral QHS   budesonide (PULMICORT) nebulizer solution  0.5 mg Nebulization BID   Chlorhexidine Gluconate Cloth  6 each Topical Q0600   dextromethorphan-guaiFENesin  1 tablet Oral BID   [START ON 07/30/2021] diltiazem  120 mg Oral Daily   doxycycline  100 mg Oral Q12H   gabapentin  300 mg Oral Daily   insulin aspart  0-5 Units Subcutaneous QHS   insulin aspart  0-6 Units Subcutaneous TID WC   ipratropium-albuterol  3 mL Nebulization TID    methylPREDNISolone (SOLU-MEDROL) injection  40 mg Intravenous Q12H   [START ON 07/30/2021] metoprolol tartrate  12.5 mg Oral BID   sevelamer carbonate  800 mg Oral TID WC    have reviewed scheduled and prn medications.  Physical Exam: General:NAD, comfortable Heart:RRR, s1s2 nl Lungs: Basal rhonchi.  No increased work of breathing. Abdomen:soft, Non-tender, non-distended Extremities:No edema Dialysis Access: AV fistula.  Chaquita Basques Prasad Adwoa Axe 07/29/2021,10:53 AM  LOS: 3 days

## 2021-07-29 NOTE — Progress Notes (Signed)
Progress Note   Patient: Jody [name] EUM:[id number] DOB: May 25, 1940 DOA: 07/25/2021     3 DOS: the patient was seen and examined on 07/29/2021   Brief hospital course: As per H&P written by Dr. Denton Brick on 07/25/2021 Jody [name] is a 82 y.o. female with medical history significant for atrial fibrillation, ESRD, systolic and diastolic CHF, hypertension, diabetes mellitus, COPD with chronic respiratory failure. Patient presented to the ED with complaints of difficulty breathing that started today.  She denies cough.  No chest pain.  No lower extremity swelling.  She is on 2  - 3 L of oxygen at baseline.   Recent hospitalization 5/20 through 5/22 for chest pains, troponins EKG unremarkable.  Dialyzed during hospitalization and discharged.   ED Course: O2 sats intermittently dropping to 89% on 4 L, Tmax 99.2.  Heart rate mostly 60s to 108.  Respiratory rate 19-24.  Blood pressure systolic 540-086. BNP 1783.  Chest x-ray showing vascular congestion without pulmonary edema.  Troponin 34 > 35. Solu-Medrol '80mg'$  given.  DuoNebs given.  Hospitalist to admit for COPD exacerbation.   Assessment and Plan: * COPD with acute exacerbation (HCC) -Dyspnea, cough and hypoxia persist -Continue steroids, bronchodilators and doxycycline -Currently requiring 3 L of oxygen via nasal cannula  Persistent atrial fibrillation (HCC) -EKG/telemetry showing atrial fibrillation.   After discontinuing Cardizem and metoprolol BP and heart rate was better during HD session on 07/29/2021 compared to 07/28/2021 -Patient already received metoprolol and Cardizem today -We will hold further Metoprolol and Cardizem for another 24 hours --Eliquis held due to thrombocytopenia that is worsening  Acute on Chronic combined systolic and diastolic CHF (congestive heart failure)  -Chest x-ray demonstrated central pulmonary vascular congestion at time of admission -BNP elevated at 1783 and patient complaining of orthopnea and short  winded sensation. -Continue to follow daily weights, strict intake and output and low-sodium diet. 07/28/21 -Hypoxia and dyspnea persist -Continue to use hemodialysis to address volume status  ESRD on dialysis [facility]) -Schedule Tuesday Thursday Saturday.   -chest x-ray suggesting pulmonary vascular congestion  HD per nephrology service After discontinuing Cardizem and metoprolol BP and heart rate was better during HD session on 07/29/2021 compared to 07/28/2021 -Shortness of breath and hypoxia persist   Thrombocytopenia (HCC) -Platelets improved to 93 K from 67 K.   -no overt bleeding -holding eliquis currently -receiving steroids as part of tx for COPD exacerbation. -SCD's for DVT prophylaxis initiated. -per chart review patient with mild chronic thrombocytopenia with platelets count in 120-130: range before.  Class 1 obesity -Body mass index is 31.32 kg/m.  -low calorie diet and portion control discussed with patient.  Acute on chronic hypoxic respiratory failure ([place]) -On 2 to 3 L at baseline. Dyspnea and hypoxia persist   Type 2 diabetes mellitus with nephropathy ([place]) -chronically on HD. -Not on medications for diabetes currently; following diet control only -will continue SSI while inpatient and follow CBG's fluctuation.   Essential hypertension -stable overall 07/28/21----Soft blood pressure and bradycardia is precluding hemodialysis on 07/28/2021   Subjective:  After discontinuing Cardizem and metoprolol BP and heart rate was better during HD session on 07/29/2021 compared to 07/28/2021 -Shortness of breath and hypoxia persist -Tentatively possible discharge back to SNF on 07/30/2021 after back-to-back hemodialysis sessions if patient continues to improve symptomatically -  Physical Exam: Vitals:   07/29/21 1600 07/29/21 1619 07/29/21 1624 07/29/21 1745  BP: 105/64 117/70    Pulse: 83 84    Resp: 14 14    Temp:  98.4 F (36.9 C)   TempSrc:      SpO2: 100% 100%     Weight:    85.8 kg  Height:       Physical Exam  Gen:- Awake Alert, in no acute distress  HEENT:- [place].AT, No sclera icterus Nose- [place] 3L/min Neck-Supple Neck, +ve  JVD,.  Lungs-diminished in bases, no wheezing  CV- S1, S2 normal, RRR,   Abd-  +ve B.Sounds, Abd Soft, No tenderness,    Extremity/Skin:- No  edema,   good pedal pulses  Psych-affect is appropriate, oriented x3 Neuro-Generalized weakness, No new focal deficits, no tremors MSK-right arm AV fistula with positive thrill and bruit  Family Communication: No family at bedside.  Disposition:SNF in am after HD session --Tentatively possible discharge back to SNF on 07/30/2021 after back-to-back hemodialysis sessions if patient continues to improve symptomatically -  Status is: Inpatient    Planned Discharge Destination: SNF with Oxygen   Author: Roxan Hockey, MD 07/29/2021 7:03 PM  For on call review [email or web address].

## 2021-07-29 NOTE — Progress Notes (Signed)
Patient back on unit from dialysis.

## 2021-07-29 NOTE — Progress Notes (Signed)
Patient alert and oriented this morning. Washed patient face and hands for her. Took meds without difficulty. No BP meds given this am with morning meds. Call bell within reach. No further needs expressed.

## 2021-07-29 NOTE — Care Management Important Message (Signed)
Important Message  Patient Details  [name] MRN: [id number] Date of Birth: 28-Sep-1940   Medicare Important Message Given:  Yes (patient in procedure, copy left on bedside table)     [name] 07/29/2021, 3:36 PM

## 2021-07-29 NOTE — Progress Notes (Signed)
HD Treatment Note  Received patient in bed, alert and oriented. Informed consent in chart.  Time tx initiated at 1300  Time tx completed:1600  HD treatment completed. Patient tolerated well. Fistula without signs and symptoms of complications. No changes from pre-HD assessment.   Patient transported back to the room, alert and orient and in no acute distress. Report given to bedside RN.  Total UF removed:2500

## 2021-07-29 NOTE — Progress Notes (Signed)
Messaged Dr. Joesph Fillers regarding patient's loose stools reported by CNA this evening. Will continue to monitor. Purewick was removed from patient as she does not void and was irritating patient.

## 2021-07-30 LAB — RENAL FUNCTION PANEL
Albumin: 3.5 g/dL (ref 3.5–5.0)
Anion gap: 10 (ref 5–15)
BUN: 51 mg/dL — ABNORMAL HIGH (ref 8–23)
CO2: 27 mmol/L (ref 22–32)
Calcium: 9.2 mg/dL (ref 8.9–10.3)
Chloride: 94 mmol/L — ABNORMAL LOW (ref 98–111)
Creatinine, Ser: 3.54 mg/dL — ABNORMAL HIGH (ref 0.44–1.00)
GFR, Estimated: 12 mL/min — ABNORMAL LOW (ref >60)
Glucose, Bld: 190 mg/dL — ABNORMAL HIGH (ref 70–99)
Phosphorus: 4.1 mg/dL (ref 2.5–4.6)
Potassium: 3.5 mmol/L (ref 3.5–5.1)
Sodium: 131 mmol/L — ABNORMAL LOW (ref 135–145)

## 2021-07-30 LAB — GLUCOSE, CAPILLARY
Glucose-Capillary: 202 mg/dL — ABNORMAL HIGH (ref 70–99)
Glucose-Capillary: 223 mg/dL — ABNORMAL HIGH (ref 70–99)
Glucose-Capillary: 240 mg/dL — ABNORMAL HIGH (ref 70–99)

## 2021-07-30 LAB — CBC
HCT: 32.7 % — ABNORMAL LOW (ref 36.0–46.0)
Hemoglobin: 10.4 g/dL — ABNORMAL LOW (ref 12.0–15.0)
MCH: 33.8 pg (ref 26.0–34.0)
MCHC: 31.8 g/dL (ref 30.0–36.0)
MCV: 106.2 fL — ABNORMAL HIGH (ref 80.0–100.0)
Platelets: 98 10*3/uL — ABNORMAL LOW (ref 150–400)
RBC: 3.08 MIL/uL — ABNORMAL LOW (ref 3.87–5.11)
RDW: 18.2 % — ABNORMAL HIGH (ref 11.5–15.5)
WBC: 4.4 10*3/uL (ref 4.0–10.5)
nRBC: 0.5 % — ABNORMAL HIGH (ref 0.0–0.2)

## 2021-07-30 LAB — HEPATITIS B SURFACE ANTIBODY, QUANTITATIVE: Hep B S AB Quant (Post): 407.8 m[IU]/mL (ref >9.9)

## 2021-07-30 MED ORDER — DILTIAZEM HCL ER COATED BEADS 120 MG PO CP24: 120.0000 mg | ORAL_CAPSULE | ORAL | 0 refills | Status: DC

## 2021-07-30 MED ORDER — DM-GUAIFENESIN ER 30-600 MG PO TB12
1.0000 | ORAL_TABLET | Freq: Two times a day (BID) | ORAL | 0 refills | Status: AC
Start: 2021-07-30 — End: ?

## 2021-07-30 MED ORDER — PENTAFLUOROPROP-TETRAFLUOROETH EX AERO
1.0000 | INHALATION_SPRAY | CUTANEOUS | Status: DC | PRN
  Filled 2021-07-30: qty 30

## 2021-07-30 MED ORDER — DOXYCYCLINE HYCLATE 100 MG PO TABS: 100.0000 mg | ORAL_TABLET | Freq: Two times a day (BID) | ORAL | 0 refills | Status: AC

## 2021-07-30 MED ORDER — ALBUTEROL SULFATE HFA 108 (90 BASE) MCG/ACT IN AERS: 2.0000 | INHALATION_SPRAY | Freq: Four times a day (QID) | RESPIRATORY_TRACT | 1 refills | Status: AC | PRN

## 2021-07-30 MED ORDER — PREDNISONE 20 MG PO TABS: 40.0000 mg | ORAL_TABLET | Freq: Every day | ORAL | 0 refills | Status: AC

## 2021-07-30 NOTE — Progress Notes (Signed)
This information was gathered at 49, prior to the initiation of treatment

## 2021-07-30 NOTE — TOC Progression Note (Addendum)
Transition of Care [facility]) - Progression Note    Patient Details  [name] MRN: [id number] Date of Birth: 06-30-1940  Transition of Care [facility]) CM/SW Contact  Joanne Chars, LCSW Phone Number: 07/30/2021, 3:06 PM  Clinical Narrative:   CSW spoke with Ronnie Derby at [facility] about pt return today.  She was not aware, spoke to her DON, said they cannot accept her today as it is too late.  CSW asked about DC tomorrow and was told that they cannot accept until Monday due to issues with getting pt medication.  MD informed.  TOC supervisor Boris Lown contacted, speaking with DON about possible DC tomorrow.  Issue is new medications.  Tim/AC can get supply of new meds from pharmacy tomorrow-DC tomorrow.   [facility] DON: [phone], Rodriguez Hevia [phone].  Fax for DC summary: [phone]     Expected Discharge Plan: [place] Barriers to Discharge: Continued Medical Work up  Expected Discharge Plan and Services Expected Discharge Plan: [place] arrangements for the past 2 months: [place] Expected Discharge Date: 07/30/21                                     Social Determinants of Health (SDOH) Interventions    Readmission Risk Interventions    06/07/2021    2:47 PM 06/01/2021   10:59 AM 12/30/2019    1:03 PM  Readmission Risk Prevention Plan  Transportation Screening Complete Complete Complete  HRI or Home Care Consult   Complete  Social Work Consult for [place] Planning/Counseling   Complete  Palliative Care Screening   Not Complete  Medication Review [employer]) Complete Complete Complete  PCP or Specialist appointment within 3-5 days of discharge Complete    HRI or Home Care Consult Complete Complete   SW Recovery Care/Counseling Consult Complete Complete   Palliative Care Screening Complete Not Applicable   Skilled Nursing Facility Complete Complete

## 2021-07-30 NOTE — Discharge Instructions (Signed)
1)Very low-salt diet advised 2)Weigh yourself daily, call if you gain more than 3 pounds in 1 day or more than 5 pounds in 1 week as your diuretic medications and Hemodialysis schedule may need to be adjusted 3)Limit your Fluid  intake to no more than 60 ounces (1.8 Liters) per day 4)you need oxygen at home at 2 L via nasal cannula continuously while awake and while asleep--- smoking or having open fires around oxygen can cause fire, significant injury and death

## 2021-07-30 NOTE — Discharge Summary (Signed)
Jody [name], is a 81 y.o. female  DOB [birth year]  MRN [id number].  Admission date:  07/25/2021  Admitting Physician  Bethena Roys, MD  Discharge Date:  07/30/2021   Primary MD  Monico Blitz, MD  Recommendations for primary care physician for things to follow:   1)Very low-salt diet advised 2)Weigh yourself daily, call if you gain more than 3 pounds in 1 day or more than 5 pounds in 1 week as your diuretic medications and Hemodialysis schedule may need to be adjusted 3)Limit your Fluid  intake to no more than 60 ounces (1.8 Liters) per day 4)you need oxygen at home at 2 L via nasal cannula continuously while awake and while asleep--- smoking or having open fires around oxygen can cause fire, significant injury and death  Admission Diagnosis  Hypoxemia [R09.02] COPD exacerbation ([place]) [J44.1] COPD with acute exacerbation ([place]) [J44.1] Acute on chronic congestive heart failure, unspecified heart failure type ([place]) [I50.9]  Discharge Diagnosis  Hypoxemia [R09.02] COPD exacerbation ([place]) [J44.1] COPD with acute exacerbation ([place]) [J44.1] Acute on chronic congestive heart failure, unspecified heart failure type ([place]) [I50.9]    Principal Problem:   COPD with acute exacerbation ([place]) Active Problems:   Persistent atrial fibrillation (HCC)   Chronic combined systolic and diastolic CHF (congestive heart failure) ([place])   ESRD on dialysis ([place])   Thrombocytopenia ([place])   Essential hypertension   Type 2 diabetes mellitus with nephropathy ([place])   Chronic respiratory failure ([place])   Class 1 obesity      Past Medical History:  Diagnosis Date   Anemia in chronic kidney disease (CODE) 06/05/2015   Arthritis    "bad in my legs" (12/07/2014)   Chronic atrial fibrillation ([place]) 06/05/2015   Chronic back pain    "dr said my spine is crooked"   Chronic bronchitis ([place])    "get it q yr" (12/07/2014)    CKD stage 4 due to type 2 diabetes mellitus ([place]) 2/69/4854   Complication of anesthesia    headache for 2 days after surgery in May 2019   COPD (chronic obstructive pulmonary disease) ([place])    Gait difficulty 09/02/2014   Hypercholesterolemia    Hypertension    Neuropathy 2015   in legs   NSVT (nonsustained ventricular tachycardia) ([place])    Sinus brady-tachy syndrome ([place])    Stroke ([place]) 05/2014   denies residual on 12/07/2014   Type II diabetes mellitus [facility])     Past Surgical History:  Procedure Laterality Date   ABDOMINAL AORTOGRAM W/LOWER EXTREMITY Left 04/06/2021   Procedure: ABDOMINAL AORTOGRAM W/LOWER EXTREMITY;  Surgeon: Broadus John, MD;  Location: [place] [facility];  Service: Cardiovascular;  Laterality: Left;   ABDOMINAL HYSTERECTOMY  ~ 1970   AMPUTATION TOE Left 04/08/2021   Procedure: FIRST TOE LEFT FOOT AMPUTATION;  Surgeon: Trula Slade, DPM;  Location: [place];  Service: Podiatry;  Laterality: Left;   APPENDECTOMY  ~ [place] Right 07/06/2017   Procedure: RIGHT RADIOCEPHALIC ARTERIOVENOUS FISTULA creation;  Surgeon: Conrad Peetz, MD;  Location: [place];  Service: Vascular;  Laterality: Right;   [place] Right 09/17/2017   Procedure: SECOND STAGE BASILIC VEIN TRANSPOSITION RIGHT ARM;  Surgeon: Rosetta Posner, MD;  Location: [place];  Service: Vascular;  Laterality: Right;   COLONOSCOPY N/A 10/28/2017   Procedure: COLONOSCOPY;  Surgeon: Rogene Houston, MD;  [facility];  Service: Endoscopy;  Laterality: N/A;   COLONOSCOPY WITH PROPOFOL N/A 12/29/2019   Procedure: COLONOSCOPY WITH PROPOFOL;  Surgeon: Eloise Harman, DO;  [facility];  Service: Endoscopy;  Laterality: N/A;   ESOPHAGOGASTRODUODENOSCOPY (EGD) WITH PROPOFOL N/A 10/31/2017   Procedure: ESOPHAGOGASTRODUODENOSCOPY (EGD) WITH PROPOFOL;  Surgeon: Rogene Houston, MD;  [facility];  Service: Endoscopy;  Laterality: N/A;   JOINT  REPLACEMENT     PERIPHERAL VASCULAR BALLOON ANGIOPLASTY Left 04/06/2021   Procedure: PERIPHERAL VASCULAR BALLOON ANGIOPLASTY;  Surgeon: Broadus John, MD;  Location: [place] [facility];  Service: Cardiovascular;  Laterality: Left;  left sfa succesful [place] AT Unsuccesful   TOTAL KNEE ARTHROPLASTY Right ~ [place]  ~ 1970   "9# in my stomach"       HPI  from the history and physical done on the day of admission:     Chief Complaint: Difficulty breathing difficulty breathing   HPI: Jody [name] is a 81 y.o. female with medical history significant for atrial fibrillation, ESRD, systolic and diastolic CHF, hypertension, diabetes mellitus, COPD with chronic respiratory failure. Patient presented to the ED with complaints of difficulty breathing that started today.  She denies cough.  No chest pain.  No lower extremity swelling.  He is on 2  - 3 L of oxygen at baseline.   Recent hospitalization 5/20 through 5/22 for chest pains, troponins EKG unremarkable.  Dialyzed during hospitalization and discharged.   ED Course: O2 sats intermittently dropping to 89% on 4 L, Tmax 99.2.  Heart rate mostly 60s to 108.  Respiratory rate 19-24.  Blood pressure systolic 889-169. BNP 1783.  Chest x-ray showing vascular congestion without pulmonary edema.  Troponin 34 > 35. Solu-Medrol '80mg'$  given.  DuoNebs given.  Hospitalist to admit for COPD exacerbation.   Review of Systems: As per HPI all other systems reviewed and negative.    Hospital Course:   Assessment and Plan:  1)COPD with acute exacerbation --- -Treated with steroids, Bronchodilators and Doxycycline --Dyspnea and  cough much improved  -Okay to discharge on prednisone and doxycycline bronchodilators -Currently requiring 2 to 3 L of oxygen via nasal cannula   Persistent atrial fibrillation ([place]) - -Okay to restart Eliquis as platelet count is now up to 98,000 -Cardizem CD has been changed to only on nonhemodialysis days due  to concerns about potential for hypotension and bradycardia on hemodialysis- -Continue low-dose metoprolol   Acute on Chronic combined systolic and diastolic CHF (congestive heart failure)  -Chest x-ray demonstrated central pulmonary vascular congestion at time of admission -BNP elevated at 1783 and patient complaining of orthopnea and short winded sensation. -Dyspnea on respiratory status improved significantly after back-to-back hemodialysis sessions -Continue to use HD sessions to address volume status   ESRD on dialysis [facility]) -Schedule Tuesday Thursday Saturday.   HD per nephrology service -Patient had serial/back-to-back hemodialysis on 07/28/2021, 07/29/2021 and again on 07/30/2021   Thrombocytopenia ([place]) -Platelets improved to 98 K from 67 K.   -no overt bleeding -Okay to restart Eliquis -receiving steroids as part of tx for COPD  exacerbation. -per chart review patient with mild chronic thrombocytopenia     Class 1 obesity -Body mass index is 31.32 kg/m.  -low calorie diet and portion control discussed with patient.   Acute on chronic hypoxic respiratory failure (HCC) -On 2 to 3 L at baseline. -Dyspnea and  cough much improved after serial/back-to-back hemodialysis sessions treatment with steroids and bronchodilators    Type 2 diabetes mellitus with nephropathy ([place]) -chronically on HD. --A1c is 5.3 reflecting excellent diabetic control PTA -Continue diet control   Essential hypertension -stable overall BP improved -Cardizem regimen changed to only on nonhemodialysis days   Discharge Condition: Stable,  Follow UP   Contact information for after-discharge care     Destination     [facility] .   Service: Skilled Nursing Contact information: [address] [place] obtained -nephrology  Diet and Activity recommendation:   As advised  Discharge Instructions    Discharge Instructions     Call MD for:  difficulty breathing, headache or visual disturbances   Complete by: As directed    Call MD for:  persistant dizziness or light-headedness   Complete by: As directed    Call MD for:  persistant nausea and vomiting   Complete by: As directed    Call MD for:  severe uncontrolled pain   Complete by: As directed    Call MD for:  temperature >100.4   Complete by: As directed    Diet - low sodium heart healthy   Complete by: As directed    Discharge instructions   Complete by: As directed    1)Very low-salt diet advised 2)Weigh yourself daily, call if you gain more than 3 pounds in 1 day or more than 5 pounds in 1 week as your diuretic medications and Hemodialysis schedule may need to be adjusted 3)Limit your Fluid  intake to no more than 60 ounces (1.8 Liters) per day 4)you need oxygen at home at 2 L via nasal cannula continuously while awake and while asleep--- smoking or having open fires around oxygen can cause fire, significant injury and death   Increase activity slowly   Complete by: As directed         Discharge Medications     Allergies as of 07/30/2021       Reactions   Codeine Nausea And Vomiting        Medication List     TAKE these medications    acetaminophen 500 MG tablet Commonly known as: TYLENOL Take 1 tablet (500 mg total) by mouth 3 (three) times daily.   albuterol 0.63 MG/3ML nebulizer solution Commonly known as: ACCUNEB Take 1 ampule by nebulization every 6 (six) hours as needed for wheezing. Patient taking 2.'5mg'$  What changed: Another medication with the same name was changed. Make sure you understand how and when to take each.   albuterol 108 (90 Base) MCG/ACT inhaler Commonly known as: VENTOLIN HFA Inhale 2 puffs into the lungs every 6 (six) hours as needed for wheezing or shortness of breath. Reported on 03/01/2015 What changed: how much to take   apixaban 2.5  MG Tabs tablet Commonly known as: ELIQUIS Take 2.5 mg by mouth 2 (two) times daily.   atorvastatin 40 MG tablet Commonly known as: LIPITOR Take 1 tablet by mouth at bedtime.  cyanocobalamin 1000 MCG tablet Take 1 tablet by mouth daily.   dextromethorphan-guaiFENesin 30-600 MG 12hr tablet Commonly known as: MUCINEX DM Take 1 tablet by mouth 2 (two) times daily.   diltiazem 120 MG 24 hr capsule Commonly known as: CARDIZEM CD Take 1 capsule (120 mg total) by mouth every Monday, Wednesday, and Friday. On Non-Hemodialysis Days ONLY Start taking on: August 01, 2021 What changed:  when to take this additional instructions   doxycycline 100 MG tablet Commonly known as: VIBRA-TABS Take 1 tablet (100 mg total) by mouth 2 (two) times daily for 5 days.   feeding supplement (NEPRO CARB STEADY) Liqd Take 237 mLs by mouth 2 (two) times daily between meals.   ferrous sulfate 325 (65 FE) MG tablet Take 325 mg by mouth 2 (two) times daily with a meal.   folic acid 1 MG tablet Commonly known as: FOLVITE Take 1 tablet (1 mg total) by mouth daily.   furosemide 40 MG tablet Commonly known as: LASIX Take 40 mg by mouth daily.   gabapentin 300 MG capsule Commonly known as: NEURONTIN Take 300 mg by mouth daily.   metoprolol tartrate 25 MG tablet Commonly known as: LOPRESSOR Take 0.5 tablets (12.5 mg total) by mouth 2 (two) times daily.   [place] Tabs Take 1 tablet by mouth at bedtime.   predniSONE 20 MG tablet Commonly known as: DELTASONE Take 2 tablets (40 mg total) by mouth daily with breakfast for 5 days.   sevelamer carbonate 800 MG tablet Commonly known as: RENVELA Take 800 mg by mouth 3 (three) times daily with meals.        Major procedures and Radiology Reports - PLEASE review detailed and final reports for all details, in brief -   DG Chest Port 1 View  Result Date: 07/28/2021 CLINICAL DATA:  Shortness of breath EXAM: PORTABLE CHEST 1 VIEW COMPARISON:  Chest x-ray  dated July 25, 2021 FINDINGS: Unchanged cardiac and mediastinal contours. Low lung volumes. Similar bibasilar opacities which are likely due to atelectasis. Possible small layering right pleural effusion. No evidence of pneumothorax. IMPRESSION: 1. Low lung volumes with bibasilar opacities which are likely due to atelectasis. 2. Possible small layering right pleural effusion. Electronically Signed   By: Yetta Glassman M.D.   On: 07/28/2021 08:53   DG Chest Port 1 View  Result Date: 07/25/2021 CLINICAL DATA:  Provided history: Shortness of breath since this morning. History of COPD, former smoker. EXAM: PORTABLE CHEST 1 VIEW COMPARISON:  Prior chest radiographs 07/03/2021 and earlier. FINDINGS: Shallow inspiration radiograph. Cardiomegaly. Aortic atherosclerosis. Central pulmonary vascular congestion without overt pulmonary edema. No definite airspace consolidation. No sizable pleural effusion or evidence of pneumothorax. No acute bony abnormality identified. Degenerative changes of the spine. IMPRESSION: Shallow inspiration radiograph. Cardiomegaly with central pulmonary vascular congestion. No overt pulmonary edema. Aortic Atherosclerosis (ICD10-I70.0). Electronically Signed   By: Kellie Simmering D.O.   On: 07/25/2021 15:48   DG Chest 1 View  Result Date: 07/03/2021 CLINICAL DATA:  Sudden onset of shortness of breath. Hypertension. Diabetes. EXAM: CHEST  1 VIEW COMPARISON:  Jul 02, 2021 FINDINGS: Stable cardiomegaly. The hila and mediastinum are unchanged. Platelike opacity in left base consistent with atelectasis. Mild haziness at the right base may represent a layering effusion. Increased interstitial markings on the right in the interval. No other interval changes or acute abnormalities. IMPRESSION: 1. Subsegmental atelectasis in the left base. 2. Increased interstitial opacities on the right may represent mild asymmetric edema. Recommend attention on follow-up. 3.  Probable small layering effusion on the  right. Electronically Signed   By: Dorise Bullion III M.D.   On: 07/03/2021 16:37   DG Chest Port 1 View  Result Date: 07/02/2021 CLINICAL DATA:  Chest pain, dyspnea EXAM: PORTABLE CHEST 1 VIEW COMPARISON:  Previous studies including chest radiograph done on 06/10/2021 and CT chest done on 03/03/2021 FINDINGS: Transverse diameter of heart is increased. Central pulmonary vessels are slightly less prominent. There is poor inspiration. There is improvement in aeration of lower lung fields. No new focal infiltrates are seen. Costophrenic angles are clear. There is no pneumothorax. IMPRESSION: Cardiomegaly. There is interval decrease in pulmonary vascular congestion. There are no new focal infiltrates. Electronically Signed   By: Elmer Picker M.D.   On: 07/02/2021 12:28    Micro Results  Recent Results (from the past 240 hour(s))  Resp Panel by RT-PCR (Flu A&B, Covid) Anterior Nasal Swab     Status: None   Collection Time: 07/25/21  3:32 PM   Specimen: Anterior Nasal Swab  Result Value Ref Range Status   SARS Coronavirus 2 by RT PCR NEGATIVE NEGATIVE Final    Comment: (NOTE) SARS-CoV-2 target nucleic acids are NOT DETECTED.  The SARS-CoV-2 RNA is generally detectable in upper respiratory specimens during the acute phase of infection. The lowest concentration of SARS-CoV-2 viral copies this assay can detect is 138 copies/mL. A negative result does not preclude SARS-Cov-2 infection and should not be used as the sole basis for treatment or other patient management decisions. A negative result may occur with  improper specimen collection/handling, submission of specimen other than nasopharyngeal swab, presence of viral mutation(s) within the areas targeted by this assay, and inadequate number of viral copies(<138 copies/mL). A negative result must be combined with clinical observations, patient history, and epidemiological information. The expected result is Negative.  Fact Sheet for  Patients:  EntrepreneurPulse.com.au  Fact Sheet for Healthcare Providers:  IncredibleEmployment.be  This test is no t yet approved or cleared by the [place] FDA and  has been authorized for detection and/or diagnosis of SARS-CoV-2 by FDA under an Emergency Use Authorization (EUA). This EUA will remain  in effect (meaning this test can be used) for the duration of the COVID-19 declaration under Section 564(b)(1) of the Act, 21 U.S.C.section 360bbb-3(b)(1), unless the authorization is terminated  or revoked sooner.       Influenza A by PCR NEGATIVE NEGATIVE Final   Influenza B by PCR NEGATIVE NEGATIVE Final    Comment: (NOTE) The Xpert Xpress SARS-CoV-2/FLU/RSV plus assay is intended as an aid in the diagnosis of influenza from Nasopharyngeal swab specimens and should not be used as a sole basis for treatment. Nasal washings and aspirates are unacceptable for Xpert Xpress SARS-CoV-2/FLU/RSV testing.  Fact Sheet for Patients: EntrepreneurPulse.com.au  Fact Sheet for Healthcare Providers: IncredibleEmployment.be  This test is not yet approved or cleared by the [place] FDA and has been authorized for detection and/or diagnosis of SARS-CoV-2 by FDA under an Emergency Use Authorization (EUA). This EUA will remain in effect (meaning this test can be used) for the duration of the COVID-19 declaration under Section 564(b)(1) of the Act, 21 U.S.C. section 360bbb-3(b)(1), unless the authorization is terminated or revoked.  Performed at [facility], [address], [place], [place] [address]     Today   Subjective    Jody [name] today has no new complaints  No fever  Or chills  -Dyspnea and  cough much improved after HD  -  Patient has been seen and examined prior to discharge   Objective   Blood pressure 130/69, pulse 90, temperature 98.1 F (36.7 C), resp. rate 15, height '5\' 7"'$   (1.702 m), weight 86.7 kg, SpO2 100 %.   Intake/Output Summary (Last 24 hours) at 07/30/2021 1447 Last data filed at 07/30/2021 0800 Gross per 24 hour  Intake 120 ml  Output --  Net 120 ml    Exam Gen:- Awake Alert, in no acute distress  HEENT:- [place].AT, No sclera icterus Nose- [place] 2L/min Neck-Supple Neck,  .  Lungs-much improved air movement,, no wheezing  CV- S1, S2 normal, RRR,   Abd-  +ve B.Sounds, Abd Soft, No tenderness,    Extremity/Skin:- No  edema,   good pedal pulses  Psych-affect is appropriate, oriented x3 Neuro-Generalized weakness, No new focal deficits, no tremors MSK-right arm AV fistula with positive thrill and bruit   Data Review   CBC w Diff:  Lab Results  Component Value Date   WBC 4.4 07/30/2021   HGB 10.4 (L) 07/30/2021   HCT 32.7 (L) 07/30/2021   PLT 98 (L) 07/30/2021   LYMPHOPCT 14 07/25/2021   MONOPCT 7 07/25/2021   EOSPCT 2 07/25/2021   BASOPCT 0 07/25/2021    CMP:  Lab Results  Component Value Date   NA 131 (L) 07/30/2021   K 3.5 07/30/2021   CL 94 (L) 07/30/2021   CO2 27 07/30/2021   BUN 51 (H) 07/30/2021   CREATININE 3.54 (H) 07/30/2021   PROT 6.6 06/06/2021   ALBUMIN 3.5 07/30/2021   BILITOT 0.5 06/06/2021   ALKPHOS 87 06/06/2021   AST 49 (H) 06/06/2021   ALT 109 (H) 06/06/2021  .  Total Discharge time is about 33 minutes  Roxan Hockey M.D on 07/30/2021 at 2:47 PM  Go to [email or web address] -  for contact info  [facility] - Office  [phone]

## 2021-07-31 LAB — GLUCOSE, CAPILLARY: Glucose-Capillary: 168 mg/dL — ABNORMAL HIGH (ref 70–99)

## 2021-07-31 NOTE — Progress Notes (Signed)
Patient was discharged on 07/30/2021 to SNF facility -SNF facility was unable to take patient until 07/31/2021 -Patient seen and reevaluated on 07/31/2021 -Patient remains medically stable for discharge to SNF facility -Please see full discharge summary dated 07/30/2021 and med rec - No charge note  Roxan Hockey, MD

## 2021-07-31 NOTE — Progress Notes (Signed)
Report called to Mariann Laster at [facility]. EMS set up for transport.

## 2021-08-02 DIAGNOSIS — N2581 Secondary hyperparathyroidism of renal origin: Secondary | ICD-10-CM | POA: Diagnosis not present

## 2021-08-02 DIAGNOSIS — Z992 Dependence on renal dialysis: Secondary | ICD-10-CM | POA: Diagnosis not present

## 2021-08-02 DIAGNOSIS — N250 Renal osteodystrophy: Secondary | ICD-10-CM | POA: Diagnosis not present

## 2021-08-02 DIAGNOSIS — N186 End stage renal disease: Secondary | ICD-10-CM | POA: Diagnosis not present

## 2021-08-02 DIAGNOSIS — E559 Vitamin D deficiency, unspecified: Secondary | ICD-10-CM | POA: Diagnosis not present

## 2021-08-02 LAB — GLUCOSE, CAPILLARY: Glucose-Capillary: 222 mg/dL — ABNORMAL HIGH (ref 70–99)

## 2021-08-03 ENCOUNTER — Ambulatory Visit: Payer: Medicare Other

## 2021-08-03 ENCOUNTER — Encounter (HOSPITAL_COMMUNITY): Payer: Medicare Other

## 2021-08-03 DIAGNOSIS — R2689 Other abnormalities of gait and mobility: Secondary | ICD-10-CM | POA: Diagnosis not present

## 2021-08-03 DIAGNOSIS — M6281 Muscle weakness (generalized): Secondary | ICD-10-CM | POA: Diagnosis not present

## 2021-08-03 DIAGNOSIS — G9341 Metabolic encephalopathy: Secondary | ICD-10-CM | POA: Diagnosis not present

## 2021-08-04 DIAGNOSIS — Z992 Dependence on renal dialysis: Secondary | ICD-10-CM | POA: Diagnosis not present

## 2021-08-04 DIAGNOSIS — G9341 Metabolic encephalopathy: Secondary | ICD-10-CM | POA: Diagnosis not present

## 2021-08-04 DIAGNOSIS — E1122 Type 2 diabetes mellitus with diabetic chronic kidney disease: Secondary | ICD-10-CM | POA: Diagnosis not present

## 2021-08-04 DIAGNOSIS — N250 Renal osteodystrophy: Secondary | ICD-10-CM | POA: Diagnosis not present

## 2021-08-04 DIAGNOSIS — E559 Vitamin D deficiency, unspecified: Secondary | ICD-10-CM | POA: Diagnosis not present

## 2021-08-04 DIAGNOSIS — J449 Chronic obstructive pulmonary disease, unspecified: Secondary | ICD-10-CM | POA: Diagnosis not present

## 2021-08-04 DIAGNOSIS — J9611 Chronic respiratory failure with hypoxia: Secondary | ICD-10-CM | POA: Diagnosis not present

## 2021-08-04 DIAGNOSIS — N186 End stage renal disease: Secondary | ICD-10-CM | POA: Diagnosis not present

## 2021-08-04 DIAGNOSIS — R2689 Other abnormalities of gait and mobility: Secondary | ICD-10-CM | POA: Diagnosis not present

## 2021-08-04 DIAGNOSIS — N2581 Secondary hyperparathyroidism of renal origin: Secondary | ICD-10-CM | POA: Diagnosis not present

## 2021-08-04 DIAGNOSIS — M6281 Muscle weakness (generalized): Secondary | ICD-10-CM | POA: Diagnosis not present

## 2021-08-05 ENCOUNTER — Encounter (HOSPITAL_COMMUNITY): Payer: Medicare Other

## 2021-08-05 DIAGNOSIS — G9341 Metabolic encephalopathy: Secondary | ICD-10-CM | POA: Diagnosis not present

## 2021-08-05 DIAGNOSIS — R2689 Other abnormalities of gait and mobility: Secondary | ICD-10-CM | POA: Diagnosis not present

## 2021-08-05 DIAGNOSIS — M6281 Muscle weakness (generalized): Secondary | ICD-10-CM | POA: Diagnosis not present

## 2021-08-06 DIAGNOSIS — N250 Renal osteodystrophy: Secondary | ICD-10-CM | POA: Diagnosis not present

## 2021-08-06 DIAGNOSIS — E559 Vitamin D deficiency, unspecified: Secondary | ICD-10-CM | POA: Diagnosis not present

## 2021-08-06 DIAGNOSIS — N186 End stage renal disease: Secondary | ICD-10-CM | POA: Diagnosis not present

## 2021-08-06 DIAGNOSIS — Z992 Dependence on renal dialysis: Secondary | ICD-10-CM | POA: Diagnosis not present

## 2021-08-06 DIAGNOSIS — N2581 Secondary hyperparathyroidism of renal origin: Secondary | ICD-10-CM | POA: Diagnosis not present

## 2021-08-08 ENCOUNTER — Ambulatory Visit (INDEPENDENT_AMBULATORY_CARE_PROVIDER_SITE_OTHER): Payer: Medicare Other | Admitting: Podiatry

## 2021-08-08 ENCOUNTER — Ambulatory Visit (INDEPENDENT_AMBULATORY_CARE_PROVIDER_SITE_OTHER): Payer: Medicare Other

## 2021-08-08 DIAGNOSIS — L97522 Non-pressure chronic ulcer of other part of left foot with fat layer exposed: Secondary | ICD-10-CM | POA: Diagnosis not present

## 2021-08-08 DIAGNOSIS — M86072 Acute hematogenous osteomyelitis, left ankle and foot: Secondary | ICD-10-CM

## 2021-08-08 DIAGNOSIS — I739 Peripheral vascular disease, unspecified: Secondary | ICD-10-CM

## 2021-08-08 DIAGNOSIS — Z9889 Other specified postprocedural states: Secondary | ICD-10-CM | POA: Diagnosis not present

## 2021-08-08 DIAGNOSIS — G9341 Metabolic encephalopathy: Secondary | ICD-10-CM | POA: Diagnosis not present

## 2021-08-08 DIAGNOSIS — R2689 Other abnormalities of gait and mobility: Secondary | ICD-10-CM | POA: Diagnosis not present

## 2021-08-08 DIAGNOSIS — M6281 Muscle weakness (generalized): Secondary | ICD-10-CM | POA: Diagnosis not present

## 2021-08-09 ENCOUNTER — Encounter (HOSPITAL_COMMUNITY): Payer: Self-pay | Admitting: Family Medicine

## 2021-08-09 ENCOUNTER — Inpatient Hospital Stay (HOSPITAL_COMMUNITY)
Admission: AD | Admit: 2021-08-09 | Discharge: 2021-08-15 | DRG: 291 | Disposition: A | Discharge status: Skilled Nursing Facility | Payer: Medicare Other | Source: Other Acute Inpatient Hospital | Attending: Internal Medicine | Admitting: Internal Medicine

## 2021-08-09 DIAGNOSIS — R451 Restlessness and agitation: Secondary | ICD-10-CM | POA: Diagnosis not present

## 2021-08-09 DIAGNOSIS — Z8249 Family history of ischemic heart disease and other diseases of the circulatory system: Secondary | ICD-10-CM

## 2021-08-09 DIAGNOSIS — E1151 Type 2 diabetes mellitus with diabetic peripheral angiopathy without gangrene: Secondary | ICD-10-CM | POA: Diagnosis present

## 2021-08-09 DIAGNOSIS — Z79899 Other long term (current) drug therapy: Secondary | ICD-10-CM | POA: Diagnosis not present

## 2021-08-09 DIAGNOSIS — E7800 Pure hypercholesterolemia, unspecified: Secondary | ICD-10-CM | POA: Diagnosis not present

## 2021-08-09 DIAGNOSIS — I10 Essential (primary) hypertension: Secondary | ICD-10-CM

## 2021-08-09 DIAGNOSIS — Z7901 Long term (current) use of anticoagulants: Secondary | ICD-10-CM

## 2021-08-09 DIAGNOSIS — N250 Renal osteodystrophy: Secondary | ICD-10-CM | POA: Diagnosis not present

## 2021-08-09 DIAGNOSIS — Z89422 Acquired absence of other left toe(s): Secondary | ICD-10-CM

## 2021-08-09 DIAGNOSIS — E1140 Type 2 diabetes mellitus with diabetic neuropathy, unspecified: Secondary | ICD-10-CM | POA: Diagnosis not present

## 2021-08-09 DIAGNOSIS — E1122 Type 2 diabetes mellitus with diabetic chronic kidney disease: Secondary | ICD-10-CM | POA: Diagnosis not present

## 2021-08-09 DIAGNOSIS — I739 Peripheral vascular disease, unspecified: Secondary | ICD-10-CM | POA: Diagnosis not present

## 2021-08-09 DIAGNOSIS — R531 Weakness: Secondary | ICD-10-CM | POA: Diagnosis not present

## 2021-08-09 DIAGNOSIS — M898X9 Other specified disorders of bone, unspecified site: Secondary | ICD-10-CM | POA: Diagnosis present

## 2021-08-09 DIAGNOSIS — I4819 Other persistent atrial fibrillation: Secondary | ICD-10-CM | POA: Diagnosis not present

## 2021-08-09 DIAGNOSIS — I132 Hypertensive heart and chronic kidney disease with heart failure and with stage 5 chronic kidney disease, or end stage renal disease: Principal | ICD-10-CM | POA: Diagnosis present

## 2021-08-09 DIAGNOSIS — J9811 Atelectasis: Secondary | ICD-10-CM | POA: Diagnosis not present

## 2021-08-09 DIAGNOSIS — I509 Heart failure, unspecified: Secondary | ICD-10-CM | POA: Diagnosis not present

## 2021-08-09 DIAGNOSIS — D696 Thrombocytopenia, unspecified: Secondary | ICD-10-CM | POA: Diagnosis not present

## 2021-08-09 DIAGNOSIS — I495 Sick sinus syndrome: Secondary | ICD-10-CM | POA: Diagnosis not present

## 2021-08-09 DIAGNOSIS — J9611 Chronic respiratory failure with hypoxia: Secondary | ICD-10-CM | POA: Diagnosis present

## 2021-08-09 DIAGNOSIS — E669 Obesity, unspecified: Secondary | ICD-10-CM | POA: Diagnosis present

## 2021-08-09 DIAGNOSIS — Z6827 Body mass index (BMI) 27.0-27.9, adult: Secondary | ICD-10-CM

## 2021-08-09 DIAGNOSIS — R778 Other specified abnormalities of plasma proteins: Secondary | ICD-10-CM

## 2021-08-09 DIAGNOSIS — D7589 Other specified diseases of blood and blood-forming organs: Secondary | ICD-10-CM | POA: Diagnosis not present

## 2021-08-09 DIAGNOSIS — R079 Chest pain, unspecified: Secondary | ICD-10-CM

## 2021-08-09 DIAGNOSIS — J449 Chronic obstructive pulmonary disease, unspecified: Secondary | ICD-10-CM | POA: Diagnosis present

## 2021-08-09 DIAGNOSIS — I4891 Unspecified atrial fibrillation: Secondary | ICD-10-CM | POA: Diagnosis not present

## 2021-08-09 DIAGNOSIS — Z992 Dependence on renal dialysis: Secondary | ICD-10-CM | POA: Diagnosis not present

## 2021-08-09 DIAGNOSIS — Z794 Long term (current) use of insulin: Secondary | ICD-10-CM | POA: Diagnosis not present

## 2021-08-09 DIAGNOSIS — D631 Anemia in chronic kidney disease: Secondary | ICD-10-CM | POA: Diagnosis present

## 2021-08-09 DIAGNOSIS — J42 Unspecified chronic bronchitis: Secondary | ICD-10-CM | POA: Diagnosis not present

## 2021-08-09 DIAGNOSIS — Z743 Need for continuous supervision: Secondary | ICD-10-CM | POA: Diagnosis not present

## 2021-08-09 DIAGNOSIS — I5040 Unspecified combined systolic (congestive) and diastolic (congestive) heart failure: Secondary | ICD-10-CM | POA: Diagnosis not present

## 2021-08-09 DIAGNOSIS — Z7951 Long term (current) use of inhaled steroids: Secondary | ICD-10-CM | POA: Diagnosis not present

## 2021-08-09 DIAGNOSIS — Z87891 Personal history of nicotine dependence: Secondary | ICD-10-CM

## 2021-08-09 DIAGNOSIS — I959 Hypotension, unspecified: Secondary | ICD-10-CM | POA: Diagnosis not present

## 2021-08-09 DIAGNOSIS — I200 Unstable angina: Secondary | ICD-10-CM | POA: Diagnosis not present

## 2021-08-09 DIAGNOSIS — I5033 Acute on chronic diastolic (congestive) heart failure: Secondary | ICD-10-CM | POA: Diagnosis not present

## 2021-08-09 DIAGNOSIS — I700 Atherosclerosis of aorta: Secondary | ICD-10-CM | POA: Diagnosis not present

## 2021-08-09 DIAGNOSIS — E785 Hyperlipidemia, unspecified: Secondary | ICD-10-CM | POA: Diagnosis not present

## 2021-08-09 DIAGNOSIS — Z7982 Long term (current) use of aspirin: Secondary | ICD-10-CM | POA: Diagnosis not present

## 2021-08-09 DIAGNOSIS — E1129 Type 2 diabetes mellitus with other diabetic kidney complication: Secondary | ICD-10-CM | POA: Diagnosis not present

## 2021-08-09 DIAGNOSIS — Z9049 Acquired absence of other specified parts of digestive tract: Secondary | ICD-10-CM

## 2021-08-09 DIAGNOSIS — I248 Other forms of acute ischemic heart disease: Secondary | ICD-10-CM | POA: Diagnosis present

## 2021-08-09 DIAGNOSIS — R0602 Shortness of breath: Secondary | ICD-10-CM | POA: Diagnosis not present

## 2021-08-09 DIAGNOSIS — M549 Dorsalgia, unspecified: Secondary | ICD-10-CM | POA: Diagnosis not present

## 2021-08-09 DIAGNOSIS — Z8673 Personal history of transient ischemic attack (TIA), and cerebral infarction without residual deficits: Secondary | ICD-10-CM

## 2021-08-09 DIAGNOSIS — J811 Chronic pulmonary edema: Secondary | ICD-10-CM | POA: Diagnosis not present

## 2021-08-09 DIAGNOSIS — R6889 Other general symptoms and signs: Secondary | ICD-10-CM | POA: Diagnosis not present

## 2021-08-09 DIAGNOSIS — E1121 Type 2 diabetes mellitus with diabetic nephropathy: Secondary | ICD-10-CM | POA: Diagnosis not present

## 2021-08-09 DIAGNOSIS — J90 Pleural effusion, not elsewhere classified: Secondary | ICD-10-CM | POA: Diagnosis not present

## 2021-08-09 DIAGNOSIS — R0603 Acute respiratory distress: Secondary | ICD-10-CM | POA: Diagnosis not present

## 2021-08-09 DIAGNOSIS — I4510 Unspecified right bundle-branch block: Secondary | ICD-10-CM | POA: Diagnosis not present

## 2021-08-09 DIAGNOSIS — R9431 Abnormal electrocardiogram [ECG] [EKG]: Secondary | ICD-10-CM | POA: Diagnosis not present

## 2021-08-09 DIAGNOSIS — M1990 Unspecified osteoarthritis, unspecified site: Secondary | ICD-10-CM | POA: Diagnosis present

## 2021-08-09 DIAGNOSIS — Z9981 Dependence on supplemental oxygen: Secondary | ICD-10-CM

## 2021-08-09 DIAGNOSIS — J9610 Chronic respiratory failure, unspecified whether with hypoxia or hypercapnia: Secondary | ICD-10-CM | POA: Diagnosis present

## 2021-08-09 DIAGNOSIS — N186 End stage renal disease: Secondary | ICD-10-CM | POA: Diagnosis not present

## 2021-08-09 DIAGNOSIS — Z7401 Bed confinement status: Secondary | ICD-10-CM | POA: Diagnosis not present

## 2021-08-09 DIAGNOSIS — J9621 Acute and chronic respiratory failure with hypoxia: Secondary | ICD-10-CM | POA: Diagnosis present

## 2021-08-09 DIAGNOSIS — G8929 Other chronic pain: Secondary | ICD-10-CM | POA: Diagnosis present

## 2021-08-09 DIAGNOSIS — Z96651 Presence of right artificial knee joint: Secondary | ICD-10-CM | POA: Diagnosis present

## 2021-08-09 DIAGNOSIS — E8779 Other fluid overload: Secondary | ICD-10-CM | POA: Diagnosis not present

## 2021-08-09 DIAGNOSIS — R0789 Other chest pain: Secondary | ICD-10-CM | POA: Diagnosis not present

## 2021-08-09 DIAGNOSIS — J410 Simple chronic bronchitis: Secondary | ICD-10-CM | POA: Diagnosis not present

## 2021-08-09 DIAGNOSIS — Z885 Allergy status to narcotic agent status: Secondary | ICD-10-CM

## 2021-08-09 DIAGNOSIS — I259 Chronic ischemic heart disease, unspecified: Secondary | ICD-10-CM | POA: Diagnosis not present

## 2021-08-09 DIAGNOSIS — I5043 Acute on chronic combined systolic (congestive) and diastolic (congestive) heart failure: Secondary | ICD-10-CM | POA: Diagnosis present

## 2021-08-09 LAB — GLUCOSE, CAPILLARY: Glucose-Capillary: 114 mg/dL — ABNORMAL HIGH (ref 70–99)

## 2021-08-09 MED ORDER — SODIUM CHLORIDE 0.9% FLUSH
3.0000 mL | Freq: Two times a day (BID) | INTRAVENOUS | Status: DC
  Administered 2021-08-10 – 2021-08-13 (×5): 3 mL via INTRAVENOUS

## 2021-08-09 MED ORDER — ATORVASTATIN CALCIUM 40 MG PO TABS
40.0000 mg | ORAL_TABLET | Freq: Every day | ORAL | Status: DC
  Administered 2021-08-10 – 2021-08-14 (×6): 40 mg via ORAL
  Filled 2021-08-09 (×6): qty 1

## 2021-08-09 MED ORDER — ACETAMINOPHEN 650 MG RE SUPP: 650.0000 mg | Freq: Four times a day (QID) | RECTAL | Status: DC | PRN

## 2021-08-09 MED ORDER — APIXABAN 2.5 MG PO TABS
2.5000 mg | ORAL_TABLET | Freq: Two times a day (BID) | ORAL | Status: DC
  Administered 2021-08-10 – 2021-08-15 (×12): 2.5 mg via ORAL
  Filled 2021-08-09 (×12): qty 1

## 2021-08-09 MED ORDER — SENNOSIDES-DOCUSATE SODIUM 8.6-50 MG PO TABS: 1.0000 | ORAL_TABLET | Freq: Every evening | ORAL | Status: DC | PRN

## 2021-08-09 MED ORDER — SEVELAMER CARBONATE 800 MG PO TABS
800.0000 mg | ORAL_TABLET | Freq: Three times a day (TID) | ORAL | Status: DC
  Administered 2021-08-10 (×2): 800 mg via ORAL
  Filled 2021-08-09: qty 1

## 2021-08-09 MED ORDER — ACETAMINOPHEN 325 MG PO TABS
650.0000 mg | ORAL_TABLET | Freq: Four times a day (QID) | ORAL | Status: DC | PRN
  Administered 2021-08-10: 650 mg via ORAL
  Filled 2021-08-09: qty 2

## 2021-08-09 MED ORDER — DM-GUAIFENESIN ER 30-600 MG PO TB12
1.0000 | ORAL_TABLET | Freq: Two times a day (BID) | ORAL | Status: DC
  Administered 2021-08-10 – 2021-08-15 (×9): 1 via ORAL
  Filled 2021-08-09 (×14): qty 1

## 2021-08-09 MED ORDER — ALBUTEROL SULFATE (2.5 MG/3ML) 0.083% IN NEBU
3.0000 mL | INHALATION_SOLUTION | Freq: Four times a day (QID) | RESPIRATORY_TRACT | Status: DC | PRN
  Administered 2021-08-13 – 2021-08-15 (×6): 3 mL via RESPIRATORY_TRACT
  Filled 2021-08-09 (×6): qty 3

## 2021-08-09 MED ORDER — INSULIN ASPART 100 UNIT/ML IJ SOLN
0.0000 [IU] | Freq: Three times a day (TID) | INTRAMUSCULAR | Status: DC
  Administered 2021-08-14: 1 [IU] via SUBCUTANEOUS
  Administered 2021-08-15: 2 [IU] via SUBCUTANEOUS

## 2021-08-09 NOTE — H&P (Signed)
History and Physical    Jody [name] HER:[id number] DOB: 05-02-1940 DOA: 08/09/2021  PCP: Monico Blitz, MD   Patient coming from: SNF   Chief Complaint: Chest pain, SOB   HPI: Jody [name] is a pleasant 81 y.o. female with medical history significant for atrial fibrillation on Eliquis, type 2 diabetes mellitus, hypertension, COPD, chronic hypoxic respiratory failure, HFpEF, and ESRD on hemodialysis who presents to the emergency department with chest pain and shortness of breath.  Patient reports that she was given a whole pill at the nursing home last night when she usually takes some crushed in applesauce, developed some central chest pain while trying to get the pill down, and states that the pain has resolved and she is swallowing without difficulty since.  She was also complaining of shortness of breath and was sent to the ED.  She denies any cough, fever, or chills.  She reports difficulty adhering to fluid restrictions.  Her niece notes that the patient often presents to the ED on Monday or Tuesday after friends and family bring her food over the weekend.  [facility] ED Course: Upon arrival to the ED, patient is found to be afebrile with systolic blood pressure of 85 and low oxygen saturation.  Chemistry panel was notable for normal potassium, normal bicarbonate, and BUN 61.  CBC with stable chronic thrombocytopenia and macrocytosis.  Troponin was 100 initially (normal <34), then 84, then 71.  proBNP was >70,000.  Chest x-ray was read as mild CHF with low volumes.  Blood cultures were collected in the ED and the patient was given 162 mg of aspirin and fentanyl.  She was transferred to [facility] for admission.  Review of Systems:  All other systems reviewed and apart from HPI, are negative.  Past Medical History:  Diagnosis Date   Anemia in chronic kidney disease (CODE) 06/05/2015   Arthritis    "bad in my legs" (12/07/2014)   Chronic atrial fibrillation ([place]) 06/05/2015    Chronic back pain    "dr said my spine is crooked"   Chronic bronchitis ([place])    "get it q yr" (12/07/2014)   CKD stage 4 due to type 2 diabetes mellitus ([place]) 8/56/3149   Complication of anesthesia    headache for 2 days after surgery in May 2019   COPD (chronic obstructive pulmonary disease) ([place])    Gait difficulty 09/02/2014   Hypercholesterolemia    Hypertension    Neuropathy 2015   in legs   NSVT (nonsustained ventricular tachycardia) ([place])    Sinus brady-tachy syndrome ([place])    Stroke ([place]) 05/2014   denies residual on 12/07/2014   Type II diabetes mellitus [facility])     Past Surgical History:  Procedure Laterality Date   ABDOMINAL AORTOGRAM W/LOWER EXTREMITY Left 04/06/2021   Procedure: ABDOMINAL AORTOGRAM W/LOWER EXTREMITY;  Surgeon: Broadus John, MD;  Location: [place] CV LAB;  Service: Cardiovascular;  Laterality: Left;   ABDOMINAL HYSTERECTOMY  ~ 1970   AMPUTATION TOE Left 04/08/2021   Procedure: FIRST TOE LEFT FOOT AMPUTATION;  Surgeon: Trula Slade, DPM;  Location: [place];  Service: Podiatry;  Laterality: Left;   APPENDECTOMY  ~ [place] Right 07/06/2017   Procedure: RIGHT RADIOCEPHALIC ARTERIOVENOUS FISTULA creation;  Surgeon: Conrad Garcon Point, MD;  Location: [place];  Service: Vascular;  Laterality: Right;   [place] Right 09/17/2017   Procedure: SECOND STAGE BASILIC VEIN TRANSPOSITION RIGHT ARM;  Surgeon: Rosetta Posner, MD;  [facility] OR;  Service: Vascular;  Laterality: Right;   COLONOSCOPY N/A 10/28/2017   Procedure: COLONOSCOPY;  Surgeon: Rogene [place], MD;  [facility];  Service: Endoscopy;  Laterality: N/A;   COLONOSCOPY WITH PROPOFOL N/A 12/29/2019   Procedure: COLONOSCOPY WITH PROPOFOL;  Surgeon: Eloise Harman, DO;  [facility];  Service: Endoscopy;  Laterality: N/A;   ESOPHAGOGASTRODUODENOSCOPY (EGD) WITH PROPOFOL N/A 10/31/2017   Procedure: ESOPHAGOGASTRODUODENOSCOPY (EGD) WITH  PROPOFOL;  Surgeon: Rogene [place], MD;  [facility];  Service: Endoscopy;  Laterality: N/A;   JOINT REPLACEMENT     PERIPHERAL VASCULAR BALLOON ANGIOPLASTY Left 04/06/2021   Procedure: PERIPHERAL VASCULAR BALLOON ANGIOPLASTY;  Surgeon: Broadus John, MD;  Location: [place] CV LAB;  Service: Cardiovascular;  Laterality: Left;  left sfa succesful [place] AT Unsuccesful   TOTAL KNEE ARTHROPLASTY Right ~ [place]  ~ 1970   "9# in my stomach"    Social History:   reports that she quit smoking about 17 years ago. Her smoking use included cigarettes. She started smoking about 68 years ago. She has a 25.00 pack-year smoking history. She has never used smokeless tobacco. She reports that she does not drink alcohol and does not use drugs.  Allergies  Allergen Reactions   Codeine Nausea And Vomiting    Family History  Problem Relation Age of Onset   Cancer Other    Heart attack Brother    Cancer Sister        breast   Alcohol abuse Brother      Prior to Admission medications   Medication Sig Start Date End Date Taking? Authorizing Provider  acetaminophen (TYLENOL) 500 MG tablet Take 1 tablet (500 mg total) by mouth 3 (three) times daily. 04/13/21   Regalado, Belkys A, MD  albuterol (ACCUNEB) 0.63 MG/3ML nebulizer solution Take 1 ampule by nebulization every 6 (six) hours as needed for wheezing. Patient taking 2.'5mg'$     [provider]  albuterol (VENTOLIN HFA) 108 (90 Base) MCG/ACT inhaler Inhale 2 puffs into the lungs every 6 (six) hours as needed for wheezing or shortness of breath. Reported on 03/01/2015 07/30/21   Roxan Hockey, MD  apixaban (ELIQUIS) 2.5 MG TABS tablet Take 2.5 mg by mouth 2 (two) times daily.    [provider]  atorvastatin (LIPITOR) 40 MG tablet Take 1 tablet by mouth at bedtime. 01/22/16   [provider]  cyanocobalamin 1000 MCG tablet Take 1 tablet by mouth daily.    [provider]   dextromethorphan-guaiFENesin (MUCINEX DM) 30-600 MG 12hr tablet Take 1 tablet by mouth 2 (two) times daily. 07/30/21   Roxan Hockey, MD  diltiazem (CARDIZEM CD) 120 MG 24 hr capsule Take 1 capsule (120 mg total) by mouth every Monday, Wednesday, and Friday. On Non-Hemodialysis Days ONLY 08/01/21   Roxan Hockey, MD  ferrous sulfate 325 (65 FE) MG tablet Take 325 mg by mouth 2 (two) times daily with a meal.    [provider]  folic acid (FOLVITE) 1 MG tablet Take 1 tablet (1 mg total) by mouth daily. 06/08/21   Orson Eva, MD  furosemide (LASIX) 40 MG tablet Take 40 mg by mouth daily. 05/12/21   [provider]  gabapentin (NEURONTIN) 300 MG capsule Take 300 mg by mouth daily. 05/11/21   [provider]  metoprolol tartrate (LOPRESSOR) 25 MG tablet Take 0.5 tablets (12.5 mg total) by mouth 2 (two) times daily. 10/18/16   Kathie Dike,  MD  Multiple Vitamins-Minerals (ONCOVITE) TABS Take 1 tablet by mouth at bedtime.    [provider]  Nutritional Supplements (FEEDING SUPPLEMENT, NEPRO CARB STEADY,) LIQD Take 237 mLs by mouth 2 (two) times daily between meals. 06/07/21   Orson Eva, MD  sevelamer carbonate (RENVELA) 800 MG tablet Take 800 mg by mouth 3 (three) times daily with meals.  07/22/19   [provider]    Physical Exam: Vitals:   08/10/21 0059 08/10/21 0107 08/10/21 0223 08/10/21 0256  BP:  (!) 77/38 (!) 76/50 (!) 79/63  Pulse:  70 (!) 112 87  Resp:  '20 20 20  '$ Temp:  98.1 F (36.7 C) 98.8 F (37.1 C)   TempSrc:  Oral    SpO2:  (!) 85% 100% 96%  Weight:      Height: 5' 8.4" (1.737 m)       Constitutional: NAD, calm  Eyes: PERTLA, lids and conjunctivae normal ENMT: Mucous membranes are moist. Posterior pharynx clear of any exudate or lesions.   Neck: supple, no masses  Respiratory: Speaking full sentences, no wheezing. No accessory muscle use.  Cardiovascular: Rate ~100 and irregularly irregular. Trace LE edema.  Abdomen: No  distension, no tenderness, soft. Bowel sounds active.  Musculoskeletal: no clubbing / cyanosis. S/p left great toe amputation.   Skin: no significant rashes, lesions, ulcers. Warm, dry, well-perfused. Neurologic: CN 2-12 grossly intact. Moving all extremities. Alert and oriented to person, intermittently oriented to place but then thinks she is at her nursing home.  Psychiatric: Pleasant. Cooperative.    Labs and Imaging on Admission: I have personally reviewed following labs and imaging studies  CBC: No results for input(s): "WBC", "NEUTROABS", "HGB", "HCT", "MCV", "PLT" in the last 168 hours. Basic Metabolic Panel: No results for input(s): "NA", "K", "CL", "CO2", "GLUCOSE", "BUN", "CREATININE", "CALCIUM", "MG", "PHOS" in the last 168 hours. GFR: Estimated Creatinine Clearance: 14.3 mL/min (A) (by C-G formula based on SCr of 3.54 mg/dL (H)). Liver Function Tests: No results for input(s): "AST", "ALT", "ALKPHOS", "BILITOT", "PROT", "ALBUMIN" in the last 168 hours. No results for input(s): "LIPASE", "AMYLASE" in the last 168 hours. No results for input(s): "AMMONIA" in the last 168 hours. Coagulation Profile: No results for input(s): "INR", "PROTIME" in the last 168 hours. Cardiac Enzymes: No results for input(s): "CKTOTAL", "CKMB", "CKMBINDEX", "TROPONINI" in the last 168 hours. BNP (last 3 results) No results for input(s): "PROBNP" in the last 8760 hours. HbA1C: No results for input(s): "HGBA1C" in the last 72 hours. CBG: Recent Labs  Lab 08/09/21 2312  GLUCAP 114*   Lipid Profile: No results for input(s): "CHOL", "HDL", "LDLCALC", "TRIG", "CHOLHDL", "LDLDIRECT" in the last 72 hours. Thyroid Function Tests: No results for input(s): "TSH", "T4TOTAL", "FREET4", "T3FREE", "THYROIDAB" in the last 72 hours. Anemia Panel: No results for input(s): "VITAMINB12", "FOLATE", "FERRITIN", "TIBC", "IRON", "RETICCTPCT" in the last 72 hours. Urine analysis:    Component Value Date/Time    COLORURINE YELLOW 05/27/2014 1902   APPEARANCEUR CLEAR 05/27/2014 1902   LABSPEC 1.012 05/27/2014 1902   PHURINE 5.0 05/27/2014 1902   GLUCOSEU NEGATIVE 05/27/2014 1902   HGBUR NEGATIVE 05/27/2014 1902   BILIRUBINUR NEGATIVE 05/27/2014 1902   KETONESUR NEGATIVE 05/27/2014 1902   PROTEINUR 100 (A) 05/27/2014 1902   UROBILINOGEN 0.2 05/27/2014 1902   NITRITE NEGATIVE 05/27/2014 1902   LEUKOCYTESUR NEGATIVE 05/27/2014 1902   Sepsis Labs: '@LABRCNTIP'$ (procalcitonin:4,lacticidven:4) )No results found for this or any previous visit (from the past 240 hour(s)).   Radiological Exams on Admission: No  results found.  EKG: Independently reviewed. Atrial fibrillation, rate 98, incomplete RBBB.   Assessment/Plan  1. Acute on chronic HFpEF  - Presents with chest pain and SOB and found to have markedly elevated pro-BNP and mild CHF findings on CXR  - She is not in resp distress; stable on her usual FiO2  - Patient acknowledges difficulty adhering to fluid restriction and her niece notes she often presents to ED on Mon or Tues after people bring her food on the weekends  - EF was 50% on TTE in April 2023  - Restrict fluids, consult nephrology in am for HD    2. ESRD  - She is hypervolemic on admission but not in respiratory distress and stable on her usual supplemental O2; no acidosis or hyperkalemia in ED, BUN is 61  - Reports completing her session on 6/24 but acknowledges dietary indiscretions   - Consult nephrology in am, restrict fluids, renally-dose medications    3. Hypotension  - SBP 85 on arrival to [facility] ED, as low as 70s on arrival to [facility]  - She is asymptomatic with no infectious s/s, no apparent bleeding, no active chest pain, extremities are warm  - She had normal lactate in ED  - EKG repeated (A fib with normal rate and no acute ischemic features)  - Plan to repeat labs stat; she took midodrine previously, will give a dose now, move to progressive unit for closer monitoring     4. Atrial fibrillation  - Continue Eliquis, hold diltiazem and metoprolol until BP improves   5. COPD; Chronic hypoxic respiratory failure  - No cough or wheezing on admission  - She uses 2-3 Lpm supplemental O2 at baseline  - Continue inhalers, O2   6. Type II DM  - A1c was only 5.3% in January 2023  - Check CBGs and use low-intensity SSI if needed    7. Hx of CVA  - Continue Lipitor   8. Elevated troponin  - Troponin mildly elevated in ED and decreasing  - She presented with chest pain but reports this was due to trying to swallow a pill at the nursing home when she usually takes them ground into apple sauce  - The pain is resolved, she is swallowing without difficult, and suspicion for ACS is low     DVT prophylaxis: Eliquis  Code Status: Full  Level of Care: Level of care: Progressive Family Communication: Niece [name]) updated by phone  Disposition Plan:  Patient is from: SNF  Anticipated d/c is to: SNF  Anticipated d/c date is: 08/12/21  Patient currently: Pending stable BP, will need inpatient HD  Consults called: None  Admission status: Inpatient     Vianne Bulls, MD [facility]  08/10/2021, 3:16 AM

## 2021-08-10 ENCOUNTER — Other Ambulatory Visit: Payer: Self-pay

## 2021-08-10 ENCOUNTER — Inpatient Hospital Stay (HOSPITAL_COMMUNITY): Payer: Medicare Other

## 2021-08-10 ENCOUNTER — Ambulatory Visit: Payer: Medicare Other

## 2021-08-10 DIAGNOSIS — J42 Unspecified chronic bronchitis: Secondary | ICD-10-CM

## 2021-08-10 DIAGNOSIS — I200 Unstable angina: Secondary | ICD-10-CM

## 2021-08-10 DIAGNOSIS — I5033 Acute on chronic diastolic (congestive) heart failure: Secondary | ICD-10-CM | POA: Diagnosis not present

## 2021-08-10 DIAGNOSIS — I959 Hypotension, unspecified: Secondary | ICD-10-CM | POA: Diagnosis present

## 2021-08-10 DIAGNOSIS — R778 Other specified abnormalities of plasma proteins: Secondary | ICD-10-CM | POA: Diagnosis not present

## 2021-08-10 LAB — CBC
HCT: 31.3 % — ABNORMAL LOW (ref 36.0–46.0)
Hemoglobin: 9.7 g/dL — ABNORMAL LOW (ref 12.0–15.0)
MCH: 33.6 pg (ref 26.0–34.0)
MCHC: 31 g/dL (ref 30.0–36.0)
MCV: 108.3 fL — ABNORMAL HIGH (ref 80.0–100.0)
Platelets: 84 10*3/uL — ABNORMAL LOW (ref 150–400)
RBC: 2.89 MIL/uL — ABNORMAL LOW (ref 3.87–5.11)
RDW: 18.7 % — ABNORMAL HIGH (ref 11.5–15.5)
WBC: 10.3 10*3/uL (ref 4.0–10.5)
nRBC: 0.3 % — ABNORMAL HIGH (ref 0.0–0.2)

## 2021-08-10 LAB — COMPREHENSIVE METABOLIC PANEL
ALT: 15 U/L (ref 0–44)
AST: 14 U/L — ABNORMAL LOW (ref 15–41)
Albumin: 2.7 g/dL — ABNORMAL LOW (ref 3.5–5.0)
Alkaline Phosphatase: 96 U/L (ref 38–126)
Anion gap: 14 (ref 5–15)
BUN: 68 mg/dL — ABNORMAL HIGH (ref 8–23)
CO2: 22 mmol/L (ref 22–32)
Calcium: 9.2 mg/dL (ref 8.9–10.3)
Chloride: 98 mmol/L (ref 98–111)
Creatinine, Ser: 5.26 mg/dL — ABNORMAL HIGH (ref 0.44–1.00)
GFR, Estimated: 8 mL/min — ABNORMAL LOW (ref >60)
Glucose, Bld: 166 mg/dL — ABNORMAL HIGH (ref 70–99)
Potassium: 4.5 mmol/L (ref 3.5–5.1)
Sodium: 134 mmol/L — ABNORMAL LOW (ref 135–145)
Total Bilirubin: 1.4 mg/dL — ABNORMAL HIGH (ref 0.3–1.2)
Total Protein: 5 g/dL — ABNORMAL LOW (ref 6.5–8.1)

## 2021-08-10 LAB — LACTIC ACID, PLASMA: Lactic Acid, Venous: 1.2 mmol/L (ref 0.5–1.9)

## 2021-08-10 LAB — GLUCOSE, CAPILLARY
Glucose-Capillary: 120 mg/dL — ABNORMAL HIGH (ref 70–99)
Glucose-Capillary: 105 mg/dL — ABNORMAL HIGH (ref 70–99)
Glucose-Capillary: 120 mg/dL — ABNORMAL HIGH (ref 70–99)

## 2021-08-10 MED ORDER — CHLORHEXIDINE GLUCONATE CLOTH 2 % EX PADS
6.0000 | MEDICATED_PAD | Freq: Every day | CUTANEOUS | Status: DC
Start: 2021-08-11 — End: 2021-08-10

## 2021-08-10 MED ORDER — MIDODRINE HCL 5 MG PO TABS
10.0000 mg | ORAL_TABLET | Freq: Three times a day (TID) | ORAL | Status: DC
  Administered 2021-08-10 – 2021-08-15 (×16): 10 mg via ORAL
  Filled 2021-08-10 (×16): qty 2

## 2021-08-10 MED ORDER — CHLORHEXIDINE GLUCONATE CLOTH 2 % EX PADS: 6.0000 | MEDICATED_PAD | Freq: Every day | CUTANEOUS | Status: DC

## 2021-08-10 MED ORDER — SODIUM CHLORIDE 0.9 % IV SOLN
62.5000 mg | INTRAVENOUS | Status: DC
  Administered 2021-08-11: 62.5 mg via INTRAVENOUS
  Filled 2021-08-10 (×2): qty 5

## 2021-08-10 MED ORDER — MIDODRINE HCL 5 MG PO TABS
10.0000 mg | ORAL_TABLET | Freq: Once | ORAL | Status: AC
  Administered 2021-08-10: 10 mg via ORAL
  Filled 2021-08-10: qty 2

## 2021-08-10 NOTE — TOC Initial Note (Addendum)
Transition of Care [facility]) - Initial/Assessment Note    Patient Details  [name] MRN: [id number] Date of Birth: 02-22-1940  [facility] CM/SW Contact:    Tresa Endo Phone Number: 08/10/2021, 3:57 PM  Clinical Narrative:                 Pt is from [facility] and will plan to DC back to [place]. CSW attempted to contact pt POA to confirm DC plan, no answer CSW left a VM and will continue to follow for DC planning needs.   POA contacted CSW back to confirm pt DC plan and shared that pt would need transportation back to [facility] bc she does not transfer pt.    Expected Discharge Plan: [place] Barriers to Discharge: Continued Medical Work up   Patient Goals and CMS Choice Patient states their goals for this hospitalization and ongoing recovery are:: Return to Facility      Expected Discharge Plan and Services Expected Discharge Plan: [place] In-house Referral: Clinical Social Work   Post Acute Care Choice: [place] Living arrangements for the past 2 months: [place]                                      Prior Living Arrangements/Services Living arrangements for the past 2 months: [place] Lives with:: Facility Resident Patient language and need for interpreter reviewed:: Yes Do you feel safe going back to the place where you live?: Yes      Need for Family Participation in Patient Care: Yes (Comment) Care giver support system in place?: Yes (comment)   Criminal Activity/Legal Involvement Pertinent to Current Situation/Hospitalization: No - Comment as needed  Activities of Daily Living Home Assistive Devices/Equipment: Eyeglasses, [employer] fast streamer, Oxygen, Wheelchair ADL Screening (condition at time of admission) Patient's cognitive ability adequate to safely complete daily activities?: Yes Is the patient deaf or have difficulty hearing?: No Does the  patient have difficulty seeing, even when wearing glasses/contacts?: No Does the patient have difficulty concentrating, remembering, or making decisions?: Yes Patient able to express need for assistance with ADLs?: Yes Does the patient have difficulty dressing or bathing?: Yes Independently performs ADLs?: No Does the patient have difficulty walking or climbing stairs?: Yes Weakness of Legs: Both Weakness of Arms/Hands: Both  Permission Sought/Granted Permission sought to share information with : Family Supports    Share Information with NAME: [name]  Permission granted to share info w AGENCY: [facility] and rehab  Permission granted to share info w Relationship: Niece  Permission granted to share info w Contact Information: [phone]  Emotional Assessment Appearance:: Appears stated age Attitude/Demeanor/Rapport: Unable to Assess Affect (typically observed): Unable to Assess Orientation: : Oriented to Self, Oriented to Place, Oriented to  Time, Oriented to Situation Alcohol / Substance Use: Not Applicable Psych Involvement: No (comment)  Admission diagnosis:  Acute on chronic diastolic CHF (congestive heart failure) (HCC) [I50.33] Patient Active Problem List   Diagnosis Date Noted   Hypotension 08/10/2021   Class 1 obesity 07/26/2021   COPD (chronic obstructive pulmonary disease) ([place]) 07/25/2021   Chest pain 07/02/2021   Pericardial friction rub 06/03/2021   Acute on chronic respiratory failure with hypoxia and hypercapnia (HCC) 06/01/2021   Septic shock ([place]) 06/01/2021   Lobar pneumonia ([place]) 06/01/2021   Persistent atrial fibrillation ([place]) 06/01/2021   Thrombocytopenia (  [place]) 05/31/2021   Acute hematogenous osteomyelitis of right foot ([place]) 05/23/2021   PAD (peripheral artery disease) ([place]) 05/23/2021   Leg pain 26/33/3545   Acute metabolic encephalopathy 62/56/3893   Sinus bradycardia 04/07/2021   Hypokalemia 04/05/2021   History of CVA (cerebrovascular  accident) 04/04/2021   Acute osteomyelitis of left foot ([place]) 04/04/2021   Chronic respiratory failure ([place]) 04/04/2021   Anemia of chronic kidney failure 04/04/2021   Hyponatremia 04/04/2021   Memory loss 04/04/2021   Pressure injury of skin 03/04/2021   Rectal bleeding 12/27/2019   NSVT (nonsustained ventricular tachycardia) ([place]) 10/27/2019   Megaloblastic anemia 10/26/2019   Chronic blood loss anemia 10/26/2017   Iron deficiency anemia 02/23/2017   Acute on chronic diastolic CHF (congestive heart failure) ([place]) 10/15/2016   Elevated troponin 10/15/2016   Type 2 diabetes mellitus with nephropathy ([place]) 10/15/2016   ESRD on dialysis ([place]) 10/15/2016   NSTEMI (non-ST elevated myocardial infarction) ([place]) 10/03/2015   Anemia in chronic kidney disease, Stage IV 06/05/2015   TIA (transient ischemic attack) 09/02/2014   Gait difficulty 09/02/2014   Other specified transient cerebral ischemias    Stroke with cerebral ischemia ([place])    Hemispheric carotid artery syndrome    Essential hypertension    PCP:  Monico Blitz, MD Pharmacy:   [facility], [place] [address] Phone: [phone] Fax: [phone]     Social Determinants of Health (SDOH) Interventions    Readmission Risk Interventions    06/07/2021    2:47 PM 06/01/2021   10:59 AM 12/30/2019    1:03 PM  Readmission Risk Prevention Plan  Transportation Screening Complete Complete Complete  HRI or Home Care Consult   Complete  Social Work Consult for [place] Planning/Counseling   Complete  Palliative Care Screening   Not Complete  Medication Review Press photographer) Complete Complete Complete  PCP or Specialist appointment within 3-5 days of discharge Complete    HRI or Home Care Consult Complete Complete   SW Recovery Care/Counseling Consult Complete Complete   Palliative Care Screening Complete Not Applicable   Skilled Nursing Facility Complete  Complete

## 2021-08-10 NOTE — Progress Notes (Signed)
[facility]                                                                              Jody [name], is a 81 y.o. female, DOB - May 25, 1940, OZD:[id number] Admit date - 08/09/2021    Outpatient Primary MD for the patient is Jody Blitz, MD  LOS - 1  days  Chief Complaint  Patient presents with   Flank Pain    Renal Failure       Brief summary   Patient is a 81 year old female with atrial fibrillation on Eliquis, DM type II, HTN, COPD, chronic hypoxic respiratory failure, chronic diastolic CHF, ESRD on HD, TTS presented with chest pain and shortness of breath.  Patient reported that she was given a whole pill at the nursing home and night before the admission when she usually takes some crushed in applesauce dropped some central chest pain while trying to get the pill down.  Chest pain has resolved.  Patient was also complaining of shortness of breath hence was sent to ED.  Patient reported difficulty adhering to fluid restrictions.  Her niece noted that patient often presents to the ED on Monday or Tuesday after friends and family brings her food over the weekend.  At [facility] ED,Troponin was 100 initially (normal <34), then 84, then 71.  proBNP was >70,000.  Chest x-ray was read as mild CHF with low volumes.    Assessment & Plan    Principal Problem:   Acute on chronic diastolic CHF (congestive heart failure) (HCC) with volume overload complicated with hypotension -Presented with chest pain, shortness of breath, markedly elevated proBNP, volume overload, peripheral edema, noncompliant with dietary and fluid restriction.  2D echo in 05/2021 had shown EF of 50%. -Patient reported not going to regular HD yesterday per her schedule.  Currently fluid overloaded however complicated with hypotension with BP in 60s and 70s.  On my repeat exam at 9:30 AM, patient is alert, blood pressure improved to 90s.   -Needs dialysis for volume overload however hypotension  complicating it.  Discussed with nephrology, Dr. Jonnie Finner, started on midodrine 10 mg 3 times daily.  Follow closely and if BP does not improve, may need to transfer to ICU.  CCM consulted.   Active Problems: ESRD with hypervolemia, dependent on HD, TTS -Per patient, did not do HD yesterday per her schedule, now presented with chest pain, volume overload and shortness of breath. -Renal consult obtained, discussed with Dr. Melvia Heaps     Elevated troponin likely due to demand ischemia from volume overload, acute on chronic diastolic CHF -Plan per #1, needs hemodialysis    Persistent atrial fibrillation ([place]) -Rate controlled, continue to hold diltiazem, metoprolol due to hypotension -Started on midodrine. -Continue eliquis.  Chronic thrombocytopenia (HCC) -Platelet count stable, slightly trended down today, no bleeding - follow closely    Essential hypertension -Currently hypotensive, started on midodrine. -Hold diltiazem, metoprolol    Type 2 diabetes mellitus with nephropathy (HCC) -Hemoglobin A1c 5.3 in 02/2021 -Placed on sensitive SSI     History of CVA (cerebrovascular accident) -Continue Lipitor    Chronic respiratory failure ([place]) -On 2 to 3  L supplemental O2 at baseline, continue    COPD (chronic obstructive pulmonary disease) (HCC) -Currently no coughing or wheezing. -Continue current management   Code Status: Full code DVT Prophylaxis:  apixaban (ELIQUIS) tablet 2.5 mg Start: 08/09/21 2330 apixaban (ELIQUIS) tablet 2.5 mg   Level of Care: Level of care: Progressive Family Communication:   Disposition Plan:      Remains inpatient appropriate: Needs hemodialysis however having ongoing hypotension, started on midodrine.  If no improvement in BP, may need to transfer to ICU   Procedures:  None  Consultants:   CCM Nephrology  Antimicrobials:     Medications  apixaban  2.5 mg Oral BID   atorvastatin  40 mg Oral QHS   dextromethorphan-guaiFENesin  1 tablet  Oral BID   insulin aspart  0-6 Units Subcutaneous TID WC   midodrine  10 mg Oral TID WC   sevelamer carbonate  800 mg Oral TID WC   sodium chloride flush  3 mL Intravenous Q12H      Subjective:   Jody [name] was seen and examined today.  Seen twice, BP soft.  Currently chest pain improved.  No acute nausea vomiting abdominal pain.  Still has significant volume overload, peripheral edema.  No acute shortness of breath.  Objective:   Vitals:   08/10/21 0355 08/10/21 0409 08/10/21 0611 08/10/21 0733  BP: (!) 82/49 (!) 82/57 (!) 83/50 (!) 83/45  Pulse: 78  91 100  Resp: '20 20 20 18  '$ Temp: 98.4 F (36.9 C) 98.8 F (37.1 C) 98.7 F (37.1 C) 98 F (36.7 C)  TempSrc: Oral Oral Oral Oral  SpO2: 100% 100% 100% 98%  Weight:      Height:        Intake/Output Summary (Last 24 hours) at 08/10/2021 0944 Last data filed at 08/10/2021 0000 Gross per 24 hour  Intake 120 ml  Output --  Net 120 ml     Wt Readings from Last 3 Encounters:  08/09/21 90.4 kg  07/30/21 86.7 kg  07/18/21 89.6 kg     Exam General: Alert and awake, oriented to self and place, poor historian Cardiovascular: Irregularly irregular Respiratory: Diminished breath sound at the bases Gastrointestinal: Soft, nontender, nondistended, + bowel sounds Ext: 2+ pedal edema bilaterally Neuro: Strength 5/5 upper and lower extremities bilaterally Skin: No rashes Psych: ?  Confused, able to answer questions, oriented to self and place    Data Reviewed:  I have personally reviewed following labs    CBC Lab Results  Component Value Date   WBC 10.3 08/10/2021   RBC 2.89 (L) 08/10/2021   HGB 9.7 (L) 08/10/2021   HCT 31.3 (L) 08/10/2021   MCV 108.3 (H) 08/10/2021   MCH 33.6 08/10/2021   PLT 84 (L) 08/10/2021   MCHC 31.0 08/10/2021   RDW 18.7 (H) 08/10/2021   LYMPHSABS 0.8 07/25/2021   MONOABS 0.4 07/25/2021   EOSABS 0.1 07/25/2021   BASOSABS 0.0 78/93/8101     Last metabolic panel Lab Results   Component Value Date   NA 134 (L) 08/10/2021   K 4.5 08/10/2021   CL 98 08/10/2021   CO2 22 08/10/2021   BUN 68 (H) 08/10/2021   CREATININE 5.26 (H) 08/10/2021   GLUCOSE 166 (H) 08/10/2021   GFRNONAA 8 (L) 08/10/2021   GFRAA 9 (L) 10/27/2019   CALCIUM 9.2 08/10/2021   PHOS 4.1 07/30/2021   PROT 5.0 (L) 08/10/2021   ALBUMIN 2.7 (L) 08/10/2021   LABGLOB 3.9 04/25/2016   AGRATIO 0.8  04/25/2016   BILITOT 1.4 (H) 08/10/2021   ALKPHOS 96 08/10/2021   AST 14 (L) 08/10/2021   ALT 15 08/10/2021   ANIONGAP 14 08/10/2021    CBG (last 3)  Recent Labs    08/09/21 2312 08/10/21 0734  GLUCAP 114* 120*        Radiology Studies: I have personally reviewed the imaging studies  No results found.     Estill Cotta M.D. [facility] 08/10/2021, 9:44 AM  Available via Epic secure chat 7am-7pm After 7 pm, please refer to night coverage provider listed on amion.

## 2021-08-10 NOTE — Plan of Care (Signed)

## 2021-08-10 NOTE — Significant Event (Signed)
Rapid Response Event Note   Reason for Call :  Asked to see pt for appropriateness for PCU vs ICU.   Pt is here for CHF. BNP at OSH was >70,000, CXR showing CHF.  On arrival pt's BP were soft. They are now 70 systolic. Orders are to move to PCU.   Initial Focused Assessment:  Pt lying in bed with eyes open, in no visible distress. She is alert and oriented to self. She denies CP/SOB/lightheadedness/dizziness. Lungs are diminished t/o. Skin warm and dry. BLE edema present.   T-98.8, HR-87, BP-79/63, RR-20, SpO2-96% on 3L [place].   Interventions:  Midodrine '10mg'$  PO X1  Plan of Care:  POC discussed with MD. STAT labs ordered and midodrine given. Ok to tx to PCU. Await labs results and response to midodrine. Low threshold to consult PCCM based on lab results and/or if BP remains low.   Event Summary:   MD Notified: Dr. Myna Hidalgo Call [phone] Arrival Time:0230 End Time:0330  Dillard Essex, RN

## 2021-08-10 NOTE — Progress Notes (Signed)
Pt receives out-pt HD at [facility] on TTS. Pt arrives at 9:45 for 10:00 chair time. Clinic advised navigator that pt admitted from snf. TOC staff made aware of this info. Will assist as needed.   Melven Sartorius Renal Navigator [phone]

## 2021-08-10 NOTE — Consult Note (Signed)
NAME:  Jody [name], MRN:  [id number], DOB:  November 29, 1940, LOS: 1 ADMISSION DATE:  08/09/2021, CONSULTATION DATE:  08/10/21 REFERRING MD:  Tana Coast, CHIEF COMPLAINT:  Hypotension  History of Present Illness:  Jody [name] is a 81 y.o. F with PMH significant for ESRD, Type 2 DM, HTN, NSVT, COPD, Atrial fibrillation who presented to the ED at [facility] after missing dialysis the day before secondary to low blood pressure and then presented to the ED with chest pain and shortness of breath.  She reported being given a whole pill at her nursing home in stead of one crushed in applesauce as she normally is the night before admission.  She did not have any trouble swallowing, cardiac work-up was unremarkable and trop peaked at 100.   CXR concerning for volume overload and proBNP >70k.  Secondary to low blood pressures and shortness of breath she was transferred to [facility].  Midodrine '10mg'$  tid was initiated, however blood pressure has remained soft after admission, so PCCM consulted.  Pertinent  Medical History   has a past medical history of Anemia in chronic kidney disease (CODE) (06/05/2015), Arthritis, Chronic atrial fibrillation ([place]) (06/05/2015), Chronic back pain, Chronic bronchitis ([place]), CKD stage 4 due to type 2 diabetes mellitus ([place]) (3/71/0626), Complication of anesthesia, COPD (chronic obstructive pulmonary disease) ([place]), Gait difficulty (09/02/2014), Hypercholesterolemia, Hypertension, Neuropathy (2015), NSVT (nonsustained ventricular tachycardia) ([place]), Sinus brady-tachy syndrome ([place]), Stroke ([place]) (05/2014), and Type II diabetes mellitus ([place]).   Significant Hospital Events: Including procedures, antibiotic start and stop dates in addition to other pertinent events   6/27 admitted to [facility] from [facility] ED 6/28 PCCM consult, MAPs improved  Interim History / Subjective:  At the time of evaluation MAP improved, pt awake and denies chest pain or shortness of breath  Objective   Blood pressure  (!) 83/45, pulse 100, temperature 98 F (36.7 C), temperature source Oral, resp. rate 18, height 5' 8.4" (1.737 m), weight 90.4 kg, SpO2 98 %.        Intake/Output Summary (Last 24 hours) at 08/10/2021 1028 Last data filed at 08/10/2021 0000 Gross per 24 hour  Intake 120 ml  Output --  Net 120 ml   Filed Weights   08/09/21 2153  Weight: 90.4 kg    General:  elderly, chronically ill-appearing F, sitting up in bed in no acute distress HEENT: MM pink/moist, sclera anicteric, pupils equal  Neuro: awake, oriented to person and place, disoriented to situation, following commands CV: s1s2 rrr, no m/r/g PULM:  mildly decreased air entry bilateral bases, no rhonchi or wheezing, on 2L, no distress GI: soft, bsx4 active  Extremities: warm/dry, 3+ pre-tibial edema  Skin: no rashes or lesions   Resolved Hospital Problem list     Assessment & Plan:     Hypotension ESRD on HD TTS Volume overload Atrial Fibrillation Improved after Midodrine, started on '10mg'$  tid and MAP >100 at the time of assessment.  She normally takes this at the time of dialysis -does not appear acutely infected or in shock -home cardizem, bb held, on Eliquis, rate currently controlled  -plan for iHD today if BP holds, currently improved.  If becomes hypotensive again please re-engage PCCM -rest of plan per [facility]    Chest Pain Seems to have been associated with swallow whole pill, now resolved, trop peaked at 100.      Best Practice (right click and "Reselect all SmartList Selections" daily)   Per primary  Labs   CBC: Recent Labs  Lab 08/10/21 0347  WBC 10.3  HGB 9.7*  HCT 31.3*  MCV 108.3*  PLT 84*    Basic Metabolic Panel: Recent Labs  Lab 08/10/21 0347  NA 134*  K 4.5  CL 98  CO2 22  GLUCOSE 166*  BUN 68*  CREATININE 5.26*  CALCIUM 9.2   GFR: Estimated Creatinine Clearance: 9.6 mL/min (A) (by C-G formula based on SCr of 5.26 mg/dL (H)). Recent Labs  Lab 08/10/21 0347  WBC 10.3   LATICACIDVEN 1.2    Liver Function Tests: Recent Labs  Lab 08/10/21 0347  AST 14*  ALT 15  ALKPHOS 96  BILITOT 1.4*  PROT 5.0*  ALBUMIN 2.7*   No results for input(s): "LIPASE", "AMYLASE" in the last 168 hours. No results for input(s): "AMMONIA" in the last 168 hours.  ABG    Component Value Date/Time   PHART 7.56 (H) 06/05/2021 0410   PCO2ART 32 06/05/2021 0410   PO2ART 58 (L) 06/05/2021 0410   HCO3 28.7 (H) 06/05/2021 0410   TCO2 17 05/27/2014 1835   ACIDBASEDEF 1.0 06/03/2021 2003   O2SAT 93.1 06/05/2021 0410     Coagulation Profile: No results for input(s): "INR", "PROTIME" in the last 168 hours.  Cardiac Enzymes: No results for input(s): "CKTOTAL", "CKMB", "CKMBINDEX", "TROPONINI" in the last 168 hours.  HbA1C: Hgb A1c MFr Bld  Date/Time Value Ref Range Status  03/04/2021 12:38 PM 5.3 4.8 - 5.6 % Final    Comment:    (NOTE)         Prediabetes: 5.7 - 6.4         Diabetes: >6.4         Glycemic control for adults with diabetes: <7.0   12/28/2019 01:11 AM 5.1 4.8 - 5.6 % Final    Comment:    (NOTE) Pre diabetes:          5.7%-6.4%  Diabetes:              >6.4%  Glycemic control for   <7.0% adults with diabetes     CBG: Recent Labs  Lab 08/09/21 2312 08/10/21 0734  GLUCAP 114* 120*    Review of Systems:   Please see the history of present illness. All other systems reviewed and are negative    Past Medical History:  She,  has a past medical history of Anemia in chronic kidney disease (CODE) (06/05/2015), Arthritis, Chronic atrial fibrillation ([place]) (06/05/2015), Chronic back pain, Chronic bronchitis ([place]), CKD stage 4 due to type 2 diabetes mellitus ([place]) (4/48/1856), Complication of anesthesia, COPD (chronic obstructive pulmonary disease) ([place]), Gait difficulty (09/02/2014), Hypercholesterolemia, Hypertension, Neuropathy (2015), NSVT (nonsustained ventricular tachycardia) ([place]), Sinus brady-tachy syndrome ([place]), Stroke ([place]) (05/2014), and Type  II diabetes mellitus ([place]).   Surgical History:   Past Surgical History:  Procedure Laterality Date   ABDOMINAL AORTOGRAM W/LOWER EXTREMITY Left 04/06/2021   Procedure: ABDOMINAL AORTOGRAM W/LOWER EXTREMITY;  Surgeon: Broadus John, MD;  Location: [place] [facility];  Service: Cardiovascular;  Laterality: Left;   ABDOMINAL HYSTERECTOMY  ~ 1970   AMPUTATION TOE Left 04/08/2021   Procedure: FIRST TOE LEFT FOOT AMPUTATION;  Surgeon: Trula Slade, DPM;  Location: [place];  Service: Podiatry;  Laterality: Left;   APPENDECTOMY  ~ [place] Right 07/06/2017   Procedure: RIGHT RADIOCEPHALIC ARTERIOVENOUS FISTULA creation;  Surgeon: Conrad Eagleville, MD;  Location: [place];  Service: Vascular;  Laterality: Right;   [place] Right 09/17/2017   Procedure: SECOND STAGE BASILIC VEIN TRANSPOSITION RIGHT  ARM;  Surgeon: Rosetta Posner, MD;  Location: [place];  Service: Vascular;  Laterality: Right;   COLONOSCOPY N/A 10/28/2017   Procedure: COLONOSCOPY;  Surgeon: Rogene Houston, MD;  [facility];  Service: Endoscopy;  Laterality: N/A;   COLONOSCOPY WITH PROPOFOL N/A 12/29/2019   Procedure: COLONOSCOPY WITH PROPOFOL;  Surgeon: Eloise Harman, DO;  [facility];  Service: Endoscopy;  Laterality: N/A;   ESOPHAGOGASTRODUODENOSCOPY (EGD) WITH PROPOFOL N/A 10/31/2017   Procedure: ESOPHAGOGASTRODUODENOSCOPY (EGD) WITH PROPOFOL;  Surgeon: Rogene Houston, MD;  [facility];  Service: Endoscopy;  Laterality: N/A;   JOINT REPLACEMENT     PERIPHERAL VASCULAR BALLOON ANGIOPLASTY Left 04/06/2021   Procedure: PERIPHERAL VASCULAR BALLOON ANGIOPLASTY;  Surgeon: Broadus John, MD;  Location: [place] [facility];  Service: Cardiovascular;  Laterality: Left;  left sfa succesful [place] AT Unsuccesful   TOTAL KNEE ARTHROPLASTY Right ~ [place]  ~ 1970   "9# in my stomach"     Social History:   reports that she quit smoking about 17 years ago.  Her smoking use included cigarettes. She started smoking about 68 years ago. She has a 25.00 pack-year smoking history. She has never used smokeless tobacco. She reports that she does not drink alcohol and does not use drugs.   Family History:  Her family history includes Alcohol abuse in her brother; Cancer in her sister and another family member; Heart attack in her brother.   Allergies Allergies  Allergen Reactions   Codeine Nausea And Vomiting     Home Medications  Prior to Admission medications   Medication Sig Start Date End Date Taking? Authorizing Provider  acetaminophen (TYLENOL) 325 MG tablet Take 325 mg by mouth 3 (three) times daily.   Yes [provider]  albuterol (PROVENTIL) (2.5 MG/3ML) 0.083% nebulizer solution Take 2.5 mg by nebulization every 6 (six) hours as needed for wheezing or shortness of breath.   Yes [provider]  albuterol (VENTOLIN HFA) 108 (90 Base) MCG/ACT inhaler Inhale 2 puffs into the lungs every 6 (six) hours as needed for wheezing or shortness of breath. Reported on 03/01/2015 Patient taking differently: Inhale 1-2 puffs into the lungs every 6 (six) hours as needed for wheezing or shortness of breath. Reported on 03/01/2015 07/30/21  Yes Emokpae, Courage, MD  apixaban (ELIQUIS) 2.5 MG TABS tablet Take 2.5 mg by mouth 2 (two) times daily.   Yes [provider]  atorvastatin (LIPITOR) 40 MG tablet Take 1 tablet by mouth at bedtime. 01/22/16  Yes [provider]  dextromethorphan-guaiFENesin (MUCINEX DM) 30-600 MG 12hr tablet Take 1 tablet by mouth 2 (two) times daily. 07/30/21  Yes Roxan Hockey, MD  diltiazem (CARDIZEM) 120 MG tablet Take 120 mg by mouth every Monday, Wednesday, and Friday.   Yes [provider]  doxycycline (DORYX) 100 MG EC tablet Take 100 mg by mouth 2 (two) times daily. 10 day course. Pt on day 1. Doxycycline DR '100mg'$  tablets.   Yes [provider]  ferrous sulfate 325 (65 FE) MG  tablet Take 325 mg by mouth 2 (two) times daily with a meal.   Yes [provider]  folic acid (FOLVITE) 580 MCG tablet Take 800 mcg by mouth daily.   Yes [provider]  furosemide (LASIX) 40 MG tablet Take 40 mg by mouth See admin instructions. 40 mg on Monday, Wednesday, Friday, Sunday. Hold on dialysis days. 05/12/21  Yes [provider]  gabapentin (  NEURONTIN) 300 MG capsule Take 300 mg by mouth daily. 05/11/21  Yes [provider]  metoprolol tartrate (LOPRESSOR) 25 MG tablet Take 0.5 tablets (12.5 mg total) by mouth 2 (two) times daily. 10/18/16  Yes Kathie Dike, MD  midodrine (PROAMATINE) 10 MG tablet Take 10 mg by mouth Every Tuesday,Thursday,and Saturday with dialysis.   Yes [provider]  Multiple Vitamins-Minerals (PRESERVISION AREDS) TABS Take 1 tablet by mouth at bedtime.   Yes [provider]  Nutritional Supplements (FEEDING SUPPLEMENT, NEPRO CARB STEADY,) LIQD Take 237 mLs by mouth 2 (two) times daily between meals. 06/07/21  Yes Tat, Shanon Brow, MD  sevelamer carbonate (RENVELA) 800 MG tablet Take 800 mg by mouth 3 (three) times daily with meals.  07/22/19  Yes [provider]  vitamin B-12 (CYANOCOBALAMIN) 1000 MCG tablet Take 1,000 mcg by mouth daily.   Yes [provider]  diltiazem (CARDIZEM CD) 120 MG 24 hr capsule Take 1 capsule (120 mg total) by mouth every Monday, Wednesday, and Friday. On Non-Hemodialysis Days ONLY Patient not taking: Reported on 08/10/2021 08/01/21   Roxan Hockey, MD     Critical care time: n/a      Otilio Carpen Hortensia Duffin, PA-C [facility] See Amion for pager If no response to pager , please call [phone] until 7pm After 7:00 pm call Elink  [phone]?[place]

## 2021-08-10 NOTE — Progress Notes (Addendum)
Patient's bp 88/47 post 200 cc NS bolus.Patient is asymptomatic.Renal PA at bedside No order received.Patient alert and oriented x 4 .

## 2021-08-10 NOTE — Plan of Care (Signed)
Problem: Education: Goal: Ability to describe self-care measures that may prevent or decrease complications (Diabetes Survival Skills Education) will improve 08/10/2021 2248 by Robley Fries, RN Outcome: Progressing 08/10/2021 2248 by Robley Fries, RN Outcome: Progressing Goal: Individualized Educational Video(s) 08/10/2021 2248 by Robley Fries, RN Outcome: Progressing 08/10/2021 2248 by Robley Fries, RN Outcome: Progressing   Problem: Coping: Goal: Ability to adjust to condition or change in health will improve 08/10/2021 2248 by Robley Fries, RN Outcome: Progressing 08/10/2021 2248 by Robley Fries, RN Outcome: Progressing   Problem: Fluid Volume: Goal: Ability to maintain a balanced intake and output will improve 08/10/2021 2248 by Robley Fries, RN Outcome: Progressing 08/10/2021 2248 by Robley Fries, RN Outcome: Progressing   Problem: Health Behavior/Discharge Planning: Goal: Ability to identify and utilize available resources and services will improve 08/10/2021 2248 by Robley Fries, RN Outcome: Progressing 08/10/2021 2248 by Robley Fries, RN Outcome: Progressing Goal: Ability to manage health-related needs will improve 08/10/2021 2248 by Robley Fries, RN Outcome: Progressing 08/10/2021 2248 by Robley Fries, RN Outcome: Progressing   Problem: Metabolic: Goal: Ability to maintain appropriate glucose levels will improve 08/10/2021 2248 by Robley Fries, RN Outcome: Progressing 08/10/2021 2248 by Robley Fries, RN Outcome: Progressing   Problem: Nutritional: Goal: Maintenance of adequate nutrition will improve 08/10/2021 2248 by Robley Fries, RN Outcome: Progressing 08/10/2021 2248 by Robley Fries, RN Outcome: Progressing Goal: Progress toward achieving an optimal weight will improve 08/10/2021 2248 by Robley Fries, RN Outcome: Progressing 08/10/2021 2248 by Robley Fries, RN Outcome: Progressing   Problem:  Skin Integrity: Goal: Risk for impaired skin integrity will decrease 08/10/2021 2248 by Robley Fries, RN Outcome: Progressing 08/10/2021 2248 by Robley Fries, RN Outcome: Progressing   Problem: Tissue Perfusion: Goal: Adequacy of tissue perfusion will improve 08/10/2021 2248 by Robley Fries, RN Outcome: Progressing 08/10/2021 2248 by Robley Fries, RN Outcome: Progressing   Problem: Education: Goal: Knowledge of General Education information will improve Description: Including pain rating scale, medication(s)/side effects and non-pharmacologic comfort measures 08/10/2021 2248 by Robley Fries, RN Outcome: Progressing 08/10/2021 2248 by Robley Fries, RN Outcome: Progressing   Problem: Health Behavior/Discharge Planning: Goal: Ability to manage health-related needs will improve 08/10/2021 2248 by Robley Fries, RN Outcome: Progressing 08/10/2021 2248 by Robley Fries, RN Outcome: Progressing   Problem: Clinical Measurements: Goal: Ability to maintain clinical measurements within normal limits will improve 08/10/2021 2248 by Robley Fries, RN Outcome: Progressing 08/10/2021 2248 by Robley Fries, RN Outcome: Progressing Goal: Will remain free from infection 08/10/2021 2248 by Robley Fries, RN Outcome: Progressing 08/10/2021 2248 by Robley Fries, RN Outcome: Progressing Goal: Diagnostic test results will improve 08/10/2021 2248 by Robley Fries, RN Outcome: Progressing 08/10/2021 2248 by Robley Fries, RN Outcome: Progressing Goal: Respiratory complications will improve 08/10/2021 2248 by Robley Fries, RN Outcome: Progressing 08/10/2021 2248 by Robley Fries, RN Outcome: Progressing Goal: Cardiovascular complication will be avoided 08/10/2021 2248 by Robley Fries, RN Outcome: Progressing 08/10/2021 2248 by Robley Fries, RN Outcome: Progressing   Problem: Activity: Goal: Risk for activity intolerance will  decrease 08/10/2021 2248 by Robley Fries, RN Outcome: Progressing 08/10/2021 2248 by Robley Fries, RN Outcome: Progressing   Problem: Nutrition: Goal: Adequate nutrition will be maintained 08/10/2021 2248 by Robley Fries, RN Outcome: Progressing 08/10/2021 2248 by Robley Fries, RN Outcome: Progressing  Problem: Coping: Goal: Level of anxiety will decrease 08/10/2021 2248 by Robley Fries, RN Outcome: Progressing 08/10/2021 2248 by Robley Fries, RN Outcome: Progressing   Problem: Elimination: Goal: Will not experience complications related to bowel motility Outcome: Progressing Goal: Will not experience complications related to urinary retention Outcome: Progressing   Problem: Pain Managment: Goal: General experience of comfort will improve Outcome: Progressing   Problem: Safety: Goal: Ability to remain free from injury will improve Outcome: Progressing   Problem: Skin Integrity: Goal: Risk for impaired skin integrity will decrease Outcome: Progressing

## 2021-08-10 NOTE — Procedures (Signed)
   I was present at this dialysis session, have reviewed the session itself and made  appropriate changes Kelly Splinter MD [place] pager [phone]   08/10/2021, 3:28 PM

## 2021-08-10 NOTE — Consult Note (Signed)
Renal Service Consult Note [facility]  Jody [name] 08/10/2021 [name], MD Requesting Physician: Dr. Tana Coast  Reason for Consult: ESRD pt w/ SOB HPI: The patient is a 81 y.o. year-old w/ hx of atrial fib, DM2, HTN, COPD on home O2, HFpEF, ESRD on HD who presented to ED w/ CP and SOB yesterday 6/27. She missed her HD yesterdasy. In ED pt denied fevers or chills. They got hx that she drinks extra fluids when family visits on the weekends. BP was 85/ 60, SpO2 was low, CXR showed vasc congestion and early IS edema. BNP > 70,000. Trop 100 > 84. K+ okay. Pt admitted for CHF. We are asked to see for dialysis.   Pt seen in room.  Last BP recorded on the box was 65/ 40. Unable to awaken the patient w/ loud voice or rustling about. Unable to get any hx.   Spoke w/ OP HD nursing, she does have history of "low BP's" from time to time. Note that her diltiazem for afib rate lowering is ordered only on non HD days, and she is on midodrine tid at home as well.     ROS - n/a   Past Medical History  Past Medical History:  Diagnosis Date   Anemia in chronic kidney disease (CODE) 06/05/2015   Arthritis    "bad in my legs" (12/07/2014)   Chronic atrial fibrillation ([place]) 06/05/2015   Chronic back pain    "dr said my spine is crooked"   Chronic bronchitis ([place])    "get it q yr" (12/07/2014)   CKD stage 4 due to type 2 diabetes mellitus ([place]) 3/87/5643   Complication of anesthesia    headache for 2 days after surgery in May 2019   COPD (chronic obstructive pulmonary disease) ([place])    Gait difficulty 09/02/2014   Hypercholesterolemia    Hypertension    Neuropathy 2015   in legs   NSVT (nonsustained ventricular tachycardia) ([place])    Sinus brady-tachy syndrome ([place])    Stroke ([place]) 05/2014   denies residual on 12/07/2014   Type II diabetes mellitus [facility])    Past Surgical History  Past Surgical History:  Procedure Laterality Date   ABDOMINAL AORTOGRAM W/LOWER EXTREMITY Left  04/06/2021   Procedure: ABDOMINAL AORTOGRAM W/LOWER EXTREMITY;  Surgeon: Broadus John, MD;  Location: [place] [facility];  Service: Cardiovascular;  Laterality: Left;   ABDOMINAL HYSTERECTOMY  ~ 1970   AMPUTATION TOE Left 04/08/2021   Procedure: FIRST TOE LEFT FOOT AMPUTATION;  Surgeon: Trula Slade, DPM;  Location: [place];  Service: Podiatry;  Laterality: Left;   APPENDECTOMY  ~ [place] Right 07/06/2017   Procedure: RIGHT RADIOCEPHALIC ARTERIOVENOUS FISTULA creation;  Surgeon: Conrad Whitewater, MD;  Location: [place];  Service: Vascular;  Laterality: Right;   [place] Right 09/17/2017   Procedure: SECOND STAGE BASILIC VEIN TRANSPOSITION RIGHT ARM;  Surgeon: Rosetta Posner, MD;  Location: [place];  Service: Vascular;  Laterality: Right;   COLONOSCOPY N/A 10/28/2017   Procedure: COLONOSCOPY;  Surgeon: Rogene [place], MD;  [facility];  Service: Endoscopy;  Laterality: N/A;   COLONOSCOPY WITH PROPOFOL N/A 12/29/2019   Procedure: COLONOSCOPY WITH PROPOFOL;  Surgeon: Eloise Harman, DO;  [facility];  Service: Endoscopy;  Laterality: N/A;   ESOPHAGOGASTRODUODENOSCOPY (EGD) WITH PROPOFOL N/A 10/31/2017   Procedure: ESOPHAGOGASTRODUODENOSCOPY (EGD) WITH PROPOFOL;  Surgeon: Rogene [place], MD;  [facility];  Service: Endoscopy;  Laterality: N/A;   JOINT REPLACEMENT     PERIPHERAL VASCULAR BALLOON ANGIOPLASTY Left 04/06/2021   Procedure: PERIPHERAL VASCULAR BALLOON ANGIOPLASTY;  Surgeon: Broadus John, MD;  Location: [place] [facility];  Service: Cardiovascular;  Laterality: Left;  left sfa succesful [place] AT Unsuccesful   TOTAL KNEE ARTHROPLASTY Right ~ 1986   TUMOR EXCISION  ~ 1970   "9# in my stomach"   Family History  Family History  Problem Relation Age of Onset   Cancer Other    Heart attack Brother    Cancer Sister        breast   Alcohol abuse Brother    Social History  reports that she quit smoking about 17  years ago. Her smoking use included cigarettes. She started smoking about 68 years ago. She has a 25.00 pack-year smoking history. She has never used smokeless tobacco. She reports that she does not drink alcohol and does not use drugs. Allergies  Allergies  Allergen Reactions   Codeine Nausea And Vomiting   Home medications Prior to Admission medications   Medication Sig Start Date End Date Taking? Authorizing Provider  acetaminophen (TYLENOL) 325 MG tablet Take 325 mg by mouth 3 (three) times daily.   Yes [provider]  albuterol (PROVENTIL) (2.5 MG/3ML) 0.083% nebulizer solution Take 2.5 mg by nebulization every 6 (six) hours as needed for wheezing or shortness of breath.   Yes [provider]  albuterol (VENTOLIN HFA) 108 (90 Base) MCG/ACT inhaler Inhale 2 puffs into the lungs every 6 (six) hours as needed for wheezing or shortness of breath. Reported on 03/01/2015 Patient taking differently: Inhale 1-2 puffs into the lungs every 6 (six) hours as needed for wheezing or shortness of breath. Reported on 03/01/2015 07/30/21  Yes Emokpae, Courage, MD  apixaban (ELIQUIS) 2.5 MG TABS tablet Take 2.5 mg by mouth 2 (two) times daily.   Yes [provider]  atorvastatin (LIPITOR) 40 MG tablet Take 1 tablet by mouth at bedtime. 01/22/16  Yes [provider]  dextromethorphan-guaiFENesin (MUCINEX DM) 30-600 MG 12hr tablet Take 1 tablet by mouth 2 (two) times daily. 07/30/21  Yes Roxan Hockey, MD  diltiazem (CARDIZEM) 120 MG tablet Take 120 mg by mouth every Monday, Wednesday, and Friday.   Yes [provider]  doxycycline (DORYX) 100 MG EC tablet Take 100 mg by mouth 2 (two) times daily. 10 day course. Pt on day 1. Doxycycline DR '100mg'$  tablets.   Yes [provider]  ferrous sulfate 325 (65 FE) MG tablet Take 325 mg by mouth 2 (two) times daily with a meal.   Yes [provider]  folic acid (FOLVITE) 154 MCG tablet Take 800 mcg by mouth  daily.   Yes [provider]  furosemide (LASIX) 40 MG tablet Take 40 mg by mouth See admin instructions. 40 mg on Monday, Wednesday, Friday, Sunday. Hold on dialysis days. 05/12/21  Yes [provider]  gabapentin (NEURONTIN) 300 MG capsule Take 300 mg by mouth daily. 05/11/21  Yes [provider]  metoprolol tartrate (LOPRESSOR) 25 MG tablet Take 0.5 tablets (12.5 mg total) by mouth 2 (two) times daily. 10/18/16  Yes Kathie Dike, MD  midodrine (PROAMATINE) 10 MG tablet Take 10 mg by mouth Every Tuesday,Thursday,and Saturday with dialysis.   Yes [provider]  Multiple Vitamins-Minerals (PRESERVISION AREDS) TABS Take 1 tablet by mouth at bedtime.   Yes [provider]  Nutritional Supplements (FEEDING SUPPLEMENT, NEPRO CARB STEADY,) LIQD Take  237 mLs by mouth 2 (two) times daily between meals. 06/07/21  Yes Tat, Shanon Brow, MD  sevelamer carbonate (RENVELA) 800 MG tablet Take 800 mg by mouth 3 (three) times daily with meals.  07/22/19  Yes [provider]  vitamin B-12 (CYANOCOBALAMIN) 1000 MCG tablet Take 1,000 mcg by mouth daily.   Yes [provider]  diltiazem (CARDIZEM CD) 120 MG 24 hr capsule Take 1 capsule (120 mg total) by mouth every Monday, Wednesday, and Friday. On Non-Hemodialysis Days ONLY Patient not taking: Reported on 08/10/2021 08/01/21   Roxan Hockey, MD     Vitals:   08/10/21 0355 08/10/21 0409 08/10/21 0611 08/10/21 0733  BP: (!) 82/49 (!) 82/57 (!) 83/50 (!) 83/45  Pulse: 78  91 100  Resp: '20 20 20 18  '$ Temp: 98.4 F (36.9 C) 98.8 F (37.1 C) 98.7 F (37.1 C) 98 F (36.7 C)  TempSrc: Oral Oral Oral Oral  SpO2: 100% 100% 100% 98%  Weight:      Height:       Exam Gen somnolent, unable to awaken, BP 65/ 45 per tele box No rash, cyanosis or gangrene No jvd or bruits Chest clear bilat to bases, no rales/ wheezing RRR no RG Abd soft obese ntnd no mass or ascites +bs GU defer MS no joint effusions or  deformity Ext diffuse 2+ pitting bilat LE edema from feet to hips Neuro is somnolent and not responding to shaking or stimulation    RUA AVF+bruit      Home meds include - diltiazem 120 mwfsun, furosemide 40 mwfsun, gabapentin 300 qd, metoprolol 12.5 bid, midodrine '10mg'$  pre hd tts, MVI, nepro,    CXR - last CXR in system is from 07/28/21   OP HD: [facility]  3h 41mn   87kg  400/500   2K bath  RUA AVF  Heparin none - venofer 50 per wk - no other meds   Assessment/ Plan: CP/ SOB - pt is 3kg up by wts, diffuse bilat LE edema. CXR not done this admission, will order now. She is not in distress, but low BP's are disturbing and unless improving we will not be able to get volume off with dialysis. Have d/w pmd, midodrine is increased to 10 tid.  CCM is also evaluating. Pt was unarousable for me this am but per pmd is awake now and BP's are > 90. Will plan on iHD today and can hopefully get some volume off.  [facility] if [facility].  Volume - vol overloaded w/ diffuse LE edema. CXR pending. Is 3kg up per wts.  COPD - on O2 at SNF I believe Atrial fib - takes BB and diltiazem, the latter on non HD days.  DM2 Combined s/d CHF Anemia esrd - Hb 9.7, no esa at OP unit. Follow.  MBD ckd - CCa in range, will add on pho. No binder, vdra noted.       RKelly Splinter MD 08/10/2021, 11:14 AM Recent Labs  Lab 08/10/21 0347  HGB 9.7*  ALBUMIN 2.7*  CALCIUM 9.2  CREATININE 5.26*  K 4.5

## 2021-08-11 DIAGNOSIS — J9611 Chronic respiratory failure with hypoxia: Secondary | ICD-10-CM | POA: Diagnosis not present

## 2021-08-11 DIAGNOSIS — I200 Unstable angina: Secondary | ICD-10-CM | POA: Diagnosis not present

## 2021-08-11 DIAGNOSIS — J410 Simple chronic bronchitis: Secondary | ICD-10-CM

## 2021-08-11 DIAGNOSIS — I5033 Acute on chronic diastolic (congestive) heart failure: Secondary | ICD-10-CM | POA: Diagnosis not present

## 2021-08-11 LAB — HEMOGLOBIN A1C
Hgb A1c MFr Bld: 5.7 % — ABNORMAL HIGH (ref 4.8–5.6)
Mean Plasma Glucose: 117 mg/dL

## 2021-08-11 LAB — GLUCOSE, CAPILLARY
Glucose-Capillary: 146 mg/dL — ABNORMAL HIGH (ref 70–99)
Glucose-Capillary: 93 mg/dL (ref 70–99)
Glucose-Capillary: 142 mg/dL — ABNORMAL HIGH (ref 70–99)
Glucose-Capillary: 142 mg/dL — ABNORMAL HIGH (ref 70–99)
Glucose-Capillary: 131 mg/dL — ABNORMAL HIGH (ref 70–99)

## 2021-08-11 LAB — MRSA NEXT GEN BY PCR, NASAL: MRSA by PCR Next Gen: NOT DETECTED

## 2021-08-11 MED ORDER — SEVELAMER CARBONATE 0.8 G PO PACK
0.8000 g | PACK | Freq: Three times a day (TID) | ORAL | Status: DC
  Administered 2021-08-11 – 2021-08-15 (×12): 0.8 g via ORAL
  Filled 2021-08-11 (×13): qty 1

## 2021-08-11 MED ORDER — SEVELAMER CARBONATE 2.4 G PO PACK
2.4000 g | PACK | Freq: Two times a day (BID) | ORAL | Status: DC
  Filled 2021-08-11: qty 1

## 2021-08-11 MED ORDER — SEVELAMER CARBONATE 2.4 G PO PACK
2.4000 g | PACK | Freq: Three times a day (TID) | ORAL | Status: DC
  Filled 2021-08-11: qty 1

## 2021-08-11 NOTE — Progress Notes (Addendum)
[facility] Progress Note   Subjective: Seen in room post HD. Very agitated, says someone put here in this room and closed the door and left her...which is accurate as transport just did exactly this. Emotional support to patient. BP low post HD.    Still with BLE pitting edema. Call light left indentation on shin.   Objective Vitals:   08/11/21 1130 08/11/21 1201 08/11/21 1253 08/11/21 1419  BP: (!) 89/52 (!) 88/50 (!) 88/46 (!) 73/59  Pulse: 62 62 65 81  Resp: '17 18 15 '$ (!) 23  Temp:    98.4 F (36.9 C)  TempSrc:    Oral  SpO2: 100% 98% 99% 95%  Weight:   84.1 kg   Height:       Physical Exam General: Very elderly female agitated at present Neuro: Oriented X 1 Heart: S1,S2 irreg, irreg. Afib on monitor, rate 90s. No M/R/G Lungs: Slightly decreased in bases otherwise CTAB Abdomen: NABS, NT Extremities: 2+ pitting edema BLE.  Dialysis Access: R AVF + T/B   Additional Objective Labs: Basic Metabolic Panel: Recent Labs  Lab 08/10/21 0347  NA 134*  K 4.5  CL 98  CO2 22  GLUCOSE 166*  BUN 68*  CREATININE 5.26*  CALCIUM 9.2   Liver Function Tests: Recent Labs  Lab 08/10/21 0347  AST 14*  ALT 15  ALKPHOS 96  BILITOT 1.4*  PROT 5.0*  ALBUMIN 2.7*   No results for input(s): "LIPASE", "AMYLASE" in the last 168 hours. CBC: Recent Labs  Lab 08/10/21 0347  WBC 10.3  HGB 9.7*  HCT 31.3*  MCV 108.3*  PLT 84*   Blood Culture    Component Value Date/Time   SDES BLOOD BLOOD LEFT HAND 05/31/2021 1315   SPECREQUEST  05/31/2021 1315    BOTTLES DRAWN AEROBIC ONLY Blood Culture results may not be optimal due to an inadequate volume of blood received in culture bottles   CULT  05/31/2021 1315    NO GROWTH 5 DAYS Performed at [facility], [address] [place], [place] [address]    REPTSTATUS 06/05/2021 FINAL 05/31/2021 1315    Cardiac Enzymes: No results for input(s): "CKTOTAL", "CKMB", "CKMBINDEX", "TROPONINI" in the last 168  hours. CBG: Recent Labs  Lab 08/10/21 1136 08/10/21 1858 08/10/21 2128 08/11/21 0752 08/11/21 1414  GLUCAP 105* 120* 146* 93 142*   Iron Studies: No results for input(s): "IRON", "TIBC", "TRANSFERRIN", "FERRITIN" in the last 72 hours. '@lablastinr3'$ @ Studies/Results: DG CHEST PORT 1 VIEW  Result Date: 08/10/2021 CLINICAL DATA:  End-stage renal disease, shortness of breath EXAM: PORTABLE CHEST 1 VIEW COMPARISON:  Portable exam 1218 hours compared to 08/09/2021 FINDINGS: Enlargement of cardiac silhouette with pulmonary vascular congestion. Atherosclerotic calcification aorta. Decreased lung volumes with bibasilar atelectasis and probable mild pulmonary edema. No definite pleural effusion or pneumothorax. Bones demineralized. IMPRESSION: Enlargement of cardiac silhouette with pulmonary vascular congestion and minimal pulmonary edema. Bibasilar atelectasis. Aortic Atherosclerosis (ICD10-I70.0). Electronically Signed   By: Lavonia Dana M.D.   On: 08/10/2021 13:03   Medications:  ferric gluconate (FERRLECIT) IVPB      apixaban  2.5 mg Oral BID   atorvastatin  40 mg Oral QHS   dextromethorphan-guaiFENesin  1 tablet Oral BID   insulin aspart  0-6 Units Subcutaneous TID WC   midodrine  10 mg Oral TID WC   sevelamer carbonate  2.4 g Oral TID WC   sodium chloride flush  3 mL Intravenous Q12H     OP HD: [facility]  3h 84mn   87kg  400/500   2K bath  RUA AVF  Heparin none - venofer 50 per wk - no other meds     Assessment/ Plan: CP/ SOB - pt is 3kg up by wts, diffuse bilat LE edema. CXR not done this admission, will order now. She is not in distress, but low BP's are disturbing and unless improving we will not be able to get volume off with dialysis. Have d/w pmd, midodrine is increased to 10 tid.  CCM is also evaluating. Pt was unarousable for me this am but per pmd is awake now and BP's are > 90. Will plan on iHD today and can hopefully get some volume off.  [facility]  Volume - vol overloaded w/ diffuse LE edema. CXR pending. Is 3kg up per wts. Net UF with HD today 1.8 L. One time dose Midodrine doesn't seem particularly helpful. Change to TIW. BB/Dilt on hold.  COPD - on O2 at SNF I believe Atrial fib - takes BB and diltiazem, the latter on non HD days.  DM2 Combined s/d CHF Anemia esrd - Hb 9.7, no esa at OP unit. Follow.  MBD ckd - CCa in range, will add on pho. No binder, vdra noted.     [name] NP-C 08/11/2021, 2:50 PM  [facility] [phone] Pt seen, examined and agree w assess/plan as above with additions as indicated. 2.1 and 1.8 L UF w/ HD x 2 here, is now 3kg under. CXR here 6/28 showed IS edema. Will cont to lower vol while here (+LE edema), lower BP threshold w/ UF on HD. Cont midodrine.  [facility] 08/12/2021, 8:42 AM

## 2021-08-11 NOTE — Progress Notes (Signed)
   08/11/21 1419  Assess: MEWS Score  Temp 98.4 F (36.9 C)  BP (!) 73/59  MAP (mmHg) (!) 59  Pulse Rate 81  ECG Heart Rate 76  Resp (!) 23  Level of Consciousness Alert  SpO2 95 %  O2 Device Nasal Cannula  O2 Flow Rate (L/min) 2 L/min  Assess: MEWS Score  MEWS Temp 0  MEWS Systolic 2  MEWS Pulse 0  MEWS RR 1  MEWS LOC 0  MEWS Score 3  MEWS Score Color Yellow  Assess: if the MEWS score is Yellow or Red  Were vital signs taken at a resting state? Yes  Focused Assessment No change from prior assessment  Does the patient meet 2 or more of the SIRS criteria? No  Does the patient have a confirmed or suspected source of infection? No  MEWS guidelines implemented *See Row Information* Yes  Treat  Pain Scale 0-10  Pain Score 0  Faces Pain Scale 0  Pain Type Acute pain  Take Vital Signs  Increase Vital Sign Frequency  Yellow: Q 2hr X 2 then Q 4hr X 2, if remains yellow, continue Q 4hrs  Escalate  MEWS: Escalate Yellow: discuss with charge nurse/RN and consider discussing with provider and RRT  Notify: Charge Nurse/RN  Name of Charge Nurse/RN Notified Jessica RN  Date Charge Nurse/RN Notified 08/11/21  Time Charge Nurse/RN Notified 1419  Notify: Provider  Provider Name/Title Dr. Tana Coast  Date Provider Notified 08/11/21  Time Provider Notified 1419  Method of Notification Page  Notification Reason Change in status  Provider response No new orders (received call back; orders to monitor since patient is awake and alert)  Date of Provider Response 08/11/21  Time of Provider Response 1430  Document  Patient Outcome Other (Comment) (stable)  Progress note created (see row info) Yes  Assess: SIRS CRITERIA  SIRS Temperature  0  SIRS Pulse 0  SIRS Respirations  1  SIRS WBC 0  SIRS Score Sum  1

## 2021-08-11 NOTE — Progress Notes (Signed)
Received patient in bed, alert and oriented. Informed consent signed and in chart.  Time tx completed:1253  Time Tx started: 0930  Pre HD Vitals: See Chart    HD treatment completed. Patient tolerated well. Fistula without signs and symptoms of complications. Patient transported back to the room, alert and orient and in no acute distress. Report given to bedside RN.  Total UF removed:1900 ml  Medication given:None  Post HD VS:see chart  Post HD weight: 84.1  Pt tolerated tx, well.  Noon Midorine dose was not given by HD Nurse.  Iron was also missed by HD Nurse. Initial goal was set to remove 2.0 to 2.5 kilos.  1.9 kilos removed.

## 2021-08-11 NOTE — Progress Notes (Signed)
[facility]                                                                              Jody [name], is a 81 y.o. female, DOB - December 10, 1940, GMW:[id number] Admit date - 08/09/2021    Outpatient Primary MD for the patient is Monico Blitz, MD  LOS - 2  days  Chief Complaint  Patient presents with   Flank Pain    Renal Failure       Brief summary   Patient is a 81 year old female with atrial fibrillation on Eliquis, DM type II, HTN, COPD, chronic hypoxic respiratory failure, chronic diastolic CHF, ESRD on HD, TTS presented with chest pain and shortness of breath.  Patient reported that she was given a whole pill at the nursing home and night before the admission when she usually takes some crushed in applesauce dropped some central chest pain while trying to get the pill down.  Chest pain has resolved.  Patient was also complaining of shortness of breath hence was sent to ED.  Patient reported difficulty adhering to fluid restrictions.  Her niece noted that patient often presents to the ED on Monday or Tuesday after friends and family brings her food over the weekend.  At [facility] ED,Troponin was 100 initially (normal <34), then 84, then 71.  proBNP was >70,000.  Chest x-ray was read as mild CHF with low volumes.    Assessment & Plan    Principal Problem:   Acute on chronic diastolic CHF (congestive heart failure) (HCC) with volume overload complicated with hypotension -Presented with chest pain, shortness of breath, markedly elevated proBNP, volume overload, peripheral edema, noncompliant with dietary and fluid restriction.  2D echo in 05/2021 had shown EF of 50%. -Received HD on 6/28, plan for another HD today  -Continue midodrine, will follow nephrology recommendations.  BP still low, asymptomatic  Active Problems: ESRD with hypervolemia, dependent on HD, TTS -Received HD on 6/28, another HD today with her TTS schedule     Elevated troponin likely due to  demand ischemia from volume overload, acute on chronic diastolic CHF -Volume management per HD    Persistent atrial fibrillation (HCC) -Rate controlled, continue to hold diltiazem, metoprolol due to hypotension -Started on midodrine. -Continue eliquis.  Chronic thrombocytopenia (HCC) -Platelet count stable, slightly trended down today, no bleeding - follow closely    Essential hypertension -BP still low, continue midodrine. -Hold beta-blocker, diltiazem    Type 2 diabetes mellitus with nephropathy (HCC) -Hemoglobin A1c 5.3 in 02/2021 -Continue sensitive SSI     History of CVA (cerebrovascular accident) -Continue Lipitor    Chronic respiratory failure ([place]) -On 2 to 3 L supplemental O2 at baseline, continue    COPD (chronic obstructive pulmonary disease) (HCC) -Currently no coughing or wheezing. -Continue current management   Code Status: Full code DVT Prophylaxis:  apixaban (ELIQUIS) tablet 2.5 mg Start: 08/09/21 2330 apixaban (ELIQUIS) tablet 2.5 mg   Level of Care: Level of care: Progressive Family Communication:   Disposition Plan:      Remains inpatient appropriate: Needs hemodialysis.  BP still soft Procedures:  HD  Consultants:   CCM Nephrology  Antimicrobials:     Medications  apixaban  2.5 mg Oral BID   atorvastatin  40 mg Oral QHS   dextromethorphan-guaiFENesin  1 tablet Oral BID   insulin aspart  0-6 Units Subcutaneous TID WC   midodrine  10 mg Oral TID WC   sevelamer carbonate  800 mg Oral TID WC   sodium chloride flush  3 mL Intravenous Q12H      Subjective:   Jody [name] was seen and examined today.  No acute complaints today.  Received HD on 6/28, and HD planned today.  No nausea vomiting or abdominal pain, diarrhea.  Still has peripheral edema.  Objective:   Vitals:   08/11/21 1100 08/11/21 1130 08/11/21 1201 08/11/21 1253  BP: (!) 97/49 (!) 89/52 (!) 88/50   Pulse: 64 62 62 65  Resp: (!) '22 17 18 15  '$ Temp:      TempSrc:       SpO2: 100% 100% 98% 99%  Weight:      Height:        Intake/Output Summary (Last 24 hours) at 08/11/2021 1310 Last data filed at 08/10/2021 1802 Gross per 24 hour  Intake --  Output 2100 ml  Net -2100 ml     Wt Readings from Last 3 Encounters:  08/11/21 85.9 kg  07/30/21 86.7 kg  07/18/21 89.6 kg    Physical Exam General: Alert and oriented x 2, poor historian, NAD Cardiovascular: Irregularly irregular Respiratory: Diminished breath sound at the bases Gastrointestinal: Soft, nontender, nondistended, NBS Ext: 2+ pitting edema bilaterally Neuro: no new deficits    Data Reviewed:  I have personally reviewed following labs    CBC Lab Results  Component Value Date   WBC 10.3 08/10/2021   RBC 2.89 (L) 08/10/2021   HGB 9.7 (L) 08/10/2021   HCT 31.3 (L) 08/10/2021   MCV 108.3 (H) 08/10/2021   MCH 33.6 08/10/2021   PLT 84 (L) 08/10/2021   MCHC 31.0 08/10/2021   RDW 18.7 (H) 08/10/2021   LYMPHSABS 0.8 07/25/2021   MONOABS 0.4 07/25/2021   EOSABS 0.1 07/25/2021   BASOSABS 0.0 25/36/6440     Last metabolic panel Lab Results  Component Value Date   NA 134 (L) 08/10/2021   K 4.5 08/10/2021   CL 98 08/10/2021   CO2 22 08/10/2021   BUN 68 (H) 08/10/2021   CREATININE 5.26 (H) 08/10/2021   GLUCOSE 166 (H) 08/10/2021   GFRNONAA 8 (L) 08/10/2021   GFRAA 9 (L) 10/27/2019   CALCIUM 9.2 08/10/2021   PHOS 4.1 07/30/2021   PROT 5.0 (L) 08/10/2021   ALBUMIN 2.7 (L) 08/10/2021   LABGLOB 3.9 04/25/2016   AGRATIO 0.8 04/25/2016   BILITOT 1.4 (H) 08/10/2021   ALKPHOS 96 08/10/2021   AST 14 (L) 08/10/2021   ALT 15 08/10/2021   ANIONGAP 14 08/10/2021    CBG (last 3)  Recent Labs    08/10/21 1858 08/10/21 2128 08/11/21 0752  GLUCAP 120* 146* 93        Radiology Studies: I have personally reviewed the imaging studies  DG CHEST PORT 1 VIEW  Result Date: 08/10/2021 CLINICAL DATA:  End-stage renal disease, shortness of breath EXAM: PORTABLE CHEST 1 VIEW  COMPARISON:  Portable exam 1218 hours compared to 08/09/2021 FINDINGS: Enlargement of cardiac silhouette with pulmonary vascular congestion. Atherosclerotic calcification aorta. Decreased lung volumes with bibasilar atelectasis and probable mild pulmonary edema. No definite pleural effusion or pneumothorax. Bones demineralized. IMPRESSION: Enlargement of cardiac silhouette with pulmonary vascular congestion and  minimal pulmonary edema. Bibasilar atelectasis. Aortic Atherosclerosis (ICD10-I70.0). Electronically Signed   By: Lavonia Dana M.D.   On: 08/10/2021 13:03       Marvel Mcphillips M.D. [facility] 08/11/2021, 1:10 PM  Available via Epic secure chat 7am-7pm After 7 pm, please refer to night coverage provider listed on amion.

## 2021-08-12 ENCOUNTER — Ambulatory Visit (HOSPITAL_COMMUNITY): Payer: Medicare Other

## 2021-08-12 DIAGNOSIS — J9611 Chronic respiratory failure with hypoxia: Secondary | ICD-10-CM | POA: Diagnosis not present

## 2021-08-12 DIAGNOSIS — I200 Unstable angina: Secondary | ICD-10-CM | POA: Diagnosis not present

## 2021-08-12 DIAGNOSIS — N186 End stage renal disease: Secondary | ICD-10-CM | POA: Diagnosis not present

## 2021-08-12 DIAGNOSIS — Z992 Dependence on renal dialysis: Secondary | ICD-10-CM | POA: Diagnosis not present

## 2021-08-12 DIAGNOSIS — J410 Simple chronic bronchitis: Secondary | ICD-10-CM | POA: Diagnosis not present

## 2021-08-12 DIAGNOSIS — I5033 Acute on chronic diastolic (congestive) heart failure: Secondary | ICD-10-CM | POA: Diagnosis not present

## 2021-08-12 LAB — BASIC METABOLIC PANEL
Anion gap: 8 (ref 5–15)
BUN: 29 mg/dL — ABNORMAL HIGH (ref 8–23)
CO2: 28 mmol/L (ref 22–32)
Calcium: 8.9 mg/dL (ref 8.9–10.3)
Chloride: 98 mmol/L (ref 98–111)
Creatinine, Ser: 3.13 mg/dL — ABNORMAL HIGH (ref 0.44–1.00)
GFR, Estimated: 14 mL/min — ABNORMAL LOW (ref >60)
Glucose, Bld: 123 mg/dL — ABNORMAL HIGH (ref 70–99)
Potassium: 3.4 mmol/L — ABNORMAL LOW (ref 3.5–5.1)
Sodium: 134 mmol/L — ABNORMAL LOW (ref 135–145)

## 2021-08-12 LAB — GLUCOSE, CAPILLARY
Glucose-Capillary: 124 mg/dL — ABNORMAL HIGH (ref 70–99)
Glucose-Capillary: 121 mg/dL — ABNORMAL HIGH (ref 70–99)
Glucose-Capillary: 105 mg/dL — ABNORMAL HIGH (ref 70–99)
Glucose-Capillary: 103 mg/dL — ABNORMAL HIGH (ref 70–99)

## 2021-08-12 MED ORDER — CHLORHEXIDINE GLUCONATE CLOTH 2 % EX PADS
6.0000 | MEDICATED_PAD | Freq: Every day | CUTANEOUS | Status: DC
  Administered 2021-08-12: 6 via TOPICAL

## 2021-08-12 NOTE — Progress Notes (Signed)
[facility]                                                                              Jody [name], is a 81 y.o. female, DOB - February 07, 1941, FAO:[id number] Admit date - 08/09/2021    Outpatient Primary MD for the patient is Monico Blitz, MD  LOS - 3  days  Chief Complaint  Patient presents with   Flank Pain    Renal Failure       Brief summary   Patient is a 81 year old female with atrial fibrillation on Eliquis, DM type II, HTN, COPD, chronic hypoxic respiratory failure, chronic diastolic CHF, ESRD on HD, TTS presented with chest pain and shortness of breath.  Patient reported that she was given a whole pill at the nursing home and night before the admission when she usually takes some crushed in applesauce dropped some central chest pain while trying to get the pill down.  Chest pain has resolved.  Patient was also complaining of shortness of breath hence was sent to ED.  Patient reported difficulty adhering to fluid restrictions.  Her niece noted that patient often presents to the ED on Monday or Tuesday after friends and family brings her food over the weekend.  At [facility] ED,Troponin was 100 initially (normal <34), then 84, then 71.  proBNP was >70,000.  Chest x-ray was read as mild CHF with low volumes.    Assessment & Plan    Principal Problem:   Acute on chronic diastolic CHF (congestive heart failure) (HCC) with volume overload complicated with hypotension -Presented with chest pain, shortness of breath, markedly elevated proBNP, volume overload, peripheral edema, noncompliant with dietary and fluid restriction.  2D echo in 05/2021 had shown EF of 50%. -Received HD on 6/28, 6/29 however HD was limited due to hypotension.  Patient declines HD today, nephrology planning another HD tomorrow morning -Continue midodrine 10 mg 3 times daily Active Problems: ESRD with hypervolemia, dependent on HD, TTS -Received HD on 6/28, 6/29, patient declining to do  HD today     Elevated troponin likely due to demand ischemia from volume overload, acute on chronic diastolic CHF -Volume management per HD    Persistent atrial fibrillation (HCC) -Rate controlled, continue to hold diltiazem, metoprolol due to hypotension -Started on midodrine. -Continue eliquis.  Chronic thrombocytopenia (HCC) -Platelet count stable, slightly trended down today, no bleeding - follow closely    Essential hypertension -BP still low, continue midodrine. -Hold beta-blocker, diltiazem    Type 2 diabetes mellitus with nephropathy (HCC) -Hemoglobin A1c 5.3 in 02/2021 -CBG stable, continue sliding scale insulin Recent Labs    08/11/21 0752 08/11/21 1414 08/11/21 1542 08/11/21 2215 08/12/21 0808 08/12/21 1210  GLUCAP 93 142* 142* 131* 124* 121*        History of CVA (cerebrovascular accident) -Continue Lipitor    Chronic respiratory failure (HCC) -On 2 to 3 L supplemental O2 at baseline, continue    COPD (chronic obstructive pulmonary disease) (HCC) -Currently no coughing or wheezing. -Continue current management  History of peripheral vascular disease -Per patient she had outpatient vascular procedures including left lower extremity ABI, arterial duplex scheduled  today and requested to be done inpatient  Code Status: Full code DVT Prophylaxis:  apixaban (ELIQUIS) tablet 2.5 mg Start: 08/09/21 2330 apixaban (ELIQUIS) tablet 2.5 mg   Level of Care: Level of care: Progressive Family Communication:   Disposition Plan:      Remains inpatient appropriate: Still has volume overload  Procedures:  HD  Consultants:   CCM Nephrology  Antimicrobials:     Medications  apixaban  2.5 mg Oral BID   atorvastatin  40 mg Oral QHS   Chlorhexidine Gluconate Cloth  6 each Topical Q0600   dextromethorphan-guaiFENesin  1 tablet Oral BID   insulin aspart  0-6 Units Subcutaneous TID WC   midodrine  10 mg Oral TID WC   sevelamer carbonate  0.8 g Oral TID WC    sodium chloride flush  3 mL Intravenous Q12H      Subjective:   Jody [name] was seen and examined today.  Patient declining hemodialysis today, was not able to complete her dialysis yesterday due to hypotension.  Still has volume overload.  Objective:   Vitals:   08/11/21 1658 08/11/21 1958 08/12/21 0551 08/12/21 0809  BP: (!) 98/58 (!) 112/55 106/77 112/77  Pulse: 79 84 84 66  Resp: (!) 21 (!) '22 14 17  '$ Temp: 98.7 F (37.1 C) 98 F (36.7 C) 98.7 F (37.1 C) 98.2 F (36.8 C)  TempSrc: Oral Oral Oral Oral  SpO2: 97% 99% 98% 100%  Weight:   83.9 kg   Height:        Intake/Output Summary (Last 24 hours) at 08/12/2021 1412 Last data filed at 08/11/2021 1817 Gross per 24 hour  Intake 105.03 ml  Output --  Net 105.03 ml     Wt Readings from Last 3 Encounters:  08/12/21 83.9 kg  07/30/21 86.7 kg  07/18/21 89.6 kg   Physical Exam General: Alert and oriented x 3, NAD Cardiovascular: S1 S2 clear, RRR.  Respiratory: Diminished breath sounds at the bases Gastrointestinal: Soft, nontender, nondistended, NBS Ext: 2+ pedal edema bilaterally Neuro: no new deficits Skin: No rashes    Data Reviewed:  I have personally reviewed following labs    CBC Lab Results  Component Value Date   WBC 10.3 08/10/2021   RBC 2.89 (L) 08/10/2021   HGB 9.7 (L) 08/10/2021   HCT 31.3 (L) 08/10/2021   MCV 108.3 (H) 08/10/2021   MCH 33.6 08/10/2021   PLT 84 (L) 08/10/2021   MCHC 31.0 08/10/2021   RDW 18.7 (H) 08/10/2021   LYMPHSABS 0.8 07/25/2021   MONOABS 0.4 07/25/2021   EOSABS 0.1 07/25/2021   BASOSABS 0.0 43/15/4008     Last metabolic panel Lab Results  Component Value Date   NA 134 (L) 08/12/2021   K 3.4 (L) 08/12/2021   CL 98 08/12/2021   CO2 28 08/12/2021   BUN 29 (H) 08/12/2021   CREATININE 3.13 (H) 08/12/2021   GLUCOSE 123 (H) 08/12/2021   GFRNONAA 14 (L) 08/12/2021   GFRAA 9 (L) 10/27/2019   CALCIUM 8.9 08/12/2021   PHOS 4.1 07/30/2021   PROT 5.0 (L)  08/10/2021   ALBUMIN 2.7 (L) 08/10/2021   LABGLOB 3.9 04/25/2016   AGRATIO 0.8 04/25/2016   BILITOT 1.4 (H) 08/10/2021   ALKPHOS 96 08/10/2021   AST 14 (L) 08/10/2021   ALT 15 08/10/2021   ANIONGAP 8 08/12/2021    CBG (last 3)  Recent Labs    08/11/21 2215 08/12/21 0808 08/12/21 1210  GLUCAP 131* 124* 121*  Radiology Studies: I have personally reviewed the imaging studies  DG Foot Complete Left  Result Date: 08/12/2021 Please see detailed radiograph report in office note.      Estill Cotta M.D. [facility] 08/12/2021, 2:12 PM  Available via Epic secure chat 7am-7pm After 7 pm, please refer to night coverage provider listed on amion.

## 2021-08-12 NOTE — Consult Note (Addendum)
   [facility] Inpatient Consult   08/12/2021  Berklee Battey January 24, 1941 [id number]  [facility] [ACO] Patient: [employer] Medicare  Primary Care Provider:  Monico Blitz, MD, [facility]  *Reviewed for high risk scores for less than 30 days hospital readmission   Plan:  No community follow up needs as patient according to LCSW inpatient [facility] team notes patient is long term care [LTC] resident at a facility and PCP is an independent embedded provider.  Will sign off For questions or referrals, please contact:   Natividad Brood, RN BSN [facility] Liaison  [phone] business mobile phone Toll free office [phone]  Fax number: [phone] [email or web address]

## 2021-08-12 NOTE — Care Management Important Message (Signed)
Important Message  Patient Details  [name] MRN: [id number] Date of Birth: 07/08/1940   Medicare Important Message Given:  Yes     Shelda Altes 08/12/2021, 9:25 AM

## 2021-08-12 NOTE — Progress Notes (Signed)
[facility] Progress Note   Subjective:   She had 1.9L UF with HD yesterday. Reports she had trouble breathing last night but feels a little bit better today. On 1L O2 via [place] and says her baseline is 3L. Denies CP and dizziness.   Objective Vitals:   08/11/21 1658 08/11/21 1958 08/12/21 0551 08/12/21 0809  BP: (!) 98/58 (!) 112/55 106/77 112/77  Pulse: 79 84 84 66  Resp: (!) 21 (!) '22 14 17  '$ Temp: 98.7 F (37.1 C) 98 F (36.7 C) 98.7 F (37.1 C) 98.2 F (36.8 C)  TempSrc: Oral Oral Oral Oral  SpO2: 97% 99% 98% 100%  Weight:   83.9 kg   Height:       Physical Exam General: Elderly female, alert and in NAD Heart: RRR, no murmurs, rubs or gallops Lungs: + crackles in bilateral bases. Respirations unlabored Abdomen: Soft, non-distended, +BS Extremities: 2+ pitting edema bilateral lower extremities Dialysis Access:  RUE AVF + bruit  Additional Objective Labs: Basic Metabolic Panel: Recent Labs  Lab 08/10/21 0347 08/12/21 0059  NA 134* 134*  K 4.5 3.4*  CL 98 98  CO2 22 28  GLUCOSE 166* 123*  BUN 68* 29*  CREATININE 5.26* 3.13*  CALCIUM 9.2 8.9   Liver Function Tests: Recent Labs  Lab 08/10/21 0347  AST 14*  ALT 15  ALKPHOS 96  BILITOT 1.4*  PROT 5.0*  ALBUMIN 2.7*   No results for input(s): "LIPASE", "AMYLASE" in the last 168 hours. CBC: Recent Labs  Lab 08/10/21 0347  WBC 10.3  HGB 9.7*  HCT 31.3*  MCV 108.3*  PLT 84*   Blood Culture    Component Value Date/Time   SDES BLOOD BLOOD LEFT HAND 05/31/2021 1315   SPECREQUEST  05/31/2021 1315    BOTTLES DRAWN AEROBIC ONLY Blood Culture results may not be optimal due to an inadequate volume of blood received in culture bottles   CULT  05/31/2021 1315    NO GROWTH 5 DAYS Performed at [facility] [address], [place], [place] [address]    REPTSTATUS 06/05/2021 FINAL 05/31/2021 1315    Cardiac Enzymes: No results for input(s): "CKTOTAL", "CKMB", "CKMBINDEX", "TROPONINI" in the  last 168 hours. CBG: Recent Labs  Lab 08/11/21 0752 08/11/21 1414 08/11/21 1542 08/11/21 2215 08/12/21 0808  GLUCAP 93 142* 142* 131* 124*   Iron Studies: No results for input(s): "IRON", "TIBC", "TRANSFERRIN", "FERRITIN" in the last 72 hours. '@lablastinr3'$ @ Studies/Results: DG CHEST PORT 1 VIEW  Result Date: 08/10/2021 CLINICAL DATA:  End-stage renal disease, shortness of breath EXAM: PORTABLE CHEST 1 VIEW COMPARISON:  Portable exam 1218 hours compared to 08/09/2021 FINDINGS: Enlargement of cardiac silhouette with pulmonary vascular congestion. Atherosclerotic calcification aorta. Decreased lung volumes with bibasilar atelectasis and probable mild pulmonary edema. No definite pleural effusion or pneumothorax. Bones demineralized. IMPRESSION: Enlargement of cardiac silhouette with pulmonary vascular congestion and minimal pulmonary edema. Bibasilar atelectasis. Aortic Atherosclerosis (ICD10-I70.0). Electronically Signed   By: Lavonia Dana M.D.   On: 08/10/2021 13:03   Medications:  ferric gluconate (FERRLECIT) IVPB Stopped (08/11/21 1743)    apixaban  2.5 mg Oral BID   atorvastatin  40 mg Oral QHS   dextromethorphan-guaiFENesin  1 tablet Oral BID   insulin aspart  0-6 Units Subcutaneous TID WC   midodrine  10 mg Oral TID WC   sevelamer carbonate  0.8 g Oral TID WC   sodium chloride flush  3 mL Intravenous Q12H    Dialysis Orders: [facility] TTS  3h 66mn   87kg  400/500   2K bath  RUA AVF  Heparin none - venofer 50 per wk - no other meds  Assessment/Plan: CP/ SOB - Hx of CHF. CXR on 6/28 showed vascular congestion and edema. Pt is below EDW by weight today but not sure if this is accurate. Still with significant edema and SOB last night. I think would benefit from an extra dialysis today but she declines.  ESRD [facility].  3. Volume - vol overloaded w/ diffuse LE edema. Midodrine dose increased but was not given dose before HD yesterday per  nursing note. Hopefully will be able to increase UF goal tomorrow.  4. COPD - on O2 3L at baseline, actually only requiring 1L today.  5. Atrial fib - takes BB and diltiazem, the latter on non HD days. Rate currently controlled.  6. DM2- per primary team.  7. Anemia esrd - Hb 9.7, no esa at OP unit. Will initiate if next Hgb is below goal.  8. MBD ckd - Corrected calcium elevated. Not on VDRA. Last phos at goal, continue binders.     SAnice Paganini PA-C 08/12/2021, 9:13 AM  [facility] Pager: [phone]

## 2021-08-12 NOTE — Progress Notes (Addendum)
SW continuing to follow as pt admitted from [facility] where she is a LTC resident. Per nephrology, plan for daily HD at this time. Pt's normal HD schedule is TTS at [facility]. Per MD during progression, no weekend dc anticipated. Updated Melanie at [facility] will assist as indicated.   Wandra Feinstein, MSW, LCSW [phone] (coverage)

## 2021-08-13 ENCOUNTER — Inpatient Hospital Stay (HOSPITAL_COMMUNITY): Payer: Medicare Other

## 2021-08-13 DIAGNOSIS — R778 Other specified abnormalities of plasma proteins: Secondary | ICD-10-CM | POA: Diagnosis not present

## 2021-08-13 DIAGNOSIS — I200 Unstable angina: Secondary | ICD-10-CM | POA: Diagnosis not present

## 2021-08-13 DIAGNOSIS — J9610 Chronic respiratory failure, unspecified whether with hypoxia or hypercapnia: Secondary | ICD-10-CM | POA: Diagnosis not present

## 2021-08-13 DIAGNOSIS — I5033 Acute on chronic diastolic (congestive) heart failure: Secondary | ICD-10-CM | POA: Diagnosis not present

## 2021-08-13 LAB — BASIC METABOLIC PANEL
Anion gap: 13 (ref 5–15)
BUN: 36 mg/dL — ABNORMAL HIGH (ref 8–23)
CO2: 25 mmol/L (ref 22–32)
Calcium: 9.3 mg/dL (ref 8.9–10.3)
Chloride: 97 mmol/L — ABNORMAL LOW (ref 98–111)
Creatinine, Ser: 3.88 mg/dL — ABNORMAL HIGH (ref 0.44–1.00)
GFR, Estimated: 11 mL/min — ABNORMAL LOW (ref >60)
Glucose, Bld: 97 mg/dL (ref 70–99)
Potassium: 4 mmol/L (ref 3.5–5.1)
Sodium: 135 mmol/L (ref 135–145)

## 2021-08-13 LAB — GLUCOSE, CAPILLARY
Glucose-Capillary: 87 mg/dL (ref 70–99)
Glucose-Capillary: 105 mg/dL — ABNORMAL HIGH (ref 70–99)

## 2021-08-13 MED ORDER — ANTICOAGULANT SODIUM CITRATE 4% (200MG/5ML) IV SOLN
5.0000 mL | Status: DC | PRN
  Filled 2021-08-13: qty 5

## 2021-08-13 MED ORDER — LORAZEPAM 2 MG/ML IJ SOLN
0.5000 mg | Freq: Once | INTRAMUSCULAR | Status: DC | PRN
  Filled 2021-08-13: qty 1

## 2021-08-13 MED ORDER — LIDOCAINE HCL (PF) 1 % IJ SOLN: 5.0000 mL | INTRAMUSCULAR | Status: DC | PRN

## 2021-08-13 MED ORDER — ALTEPLASE 2 MG IJ SOLR: 2.0000 mg | Freq: Once | INTRAMUSCULAR | Status: DC | PRN

## 2021-08-13 MED ORDER — PENTAFLUOROPROP-TETRAFLUOROETH EX AERO: 1.0000 | INHALATION_SPRAY | CUTANEOUS | Status: DC | PRN

## 2021-08-13 MED ORDER — LIDOCAINE-PRILOCAINE 2.5-2.5 % EX CREA: 1.0000 | TOPICAL_CREAM | CUTANEOUS | Status: DC | PRN

## 2021-08-13 NOTE — Progress Notes (Signed)
Patient has increased RR and increased work of breathing tonight, increased CHF changes noted on CXR, there also appears to be an anxiety component.   Plan to trial BiPAP with low-dose Ativan to help facilitate.

## 2021-08-13 NOTE — Progress Notes (Signed)
[facility]                                                                              Jody [name], is a 81 y.o. female, DOB - July 16, 1940, XVQ:[id number] Admit date - 08/09/2021    Outpatient Primary MD for the patient is Monico Blitz, MD  LOS - 4  days  Chief Complaint  Patient presents with   Flank Pain    Renal Failure       Brief summary   Patient is a 81 year old female with atrial fibrillation on Eliquis, DM type II, HTN, COPD, chronic hypoxic respiratory failure, chronic diastolic CHF, ESRD on HD, TTS presented with chest pain and shortness of breath.  Patient reported that she was given a whole pill at the nursing home and night before the admission when she usually takes some crushed in applesauce dropped some central chest pain while trying to get the pill down.  Chest pain has resolved.  Patient was also complaining of shortness of breath hence was sent to ED.  Patient reported difficulty adhering to fluid restrictions.  Her niece noted that patient often presents to the ED on Monday or Tuesday after friends and family brings her food over the weekend.  At [facility] ED,Troponin was 100 initially (normal <34), then 84, then 71.  proBNP was >70,000.  Chest x-ray was read as mild CHF with low volumes.    Assessment & Plan    Principal Problem:   Acute on chronic diastolic CHF (congestive heart failure) (HCC) with volume overload complicated with hypotension -Presented with chest pain, shortness of breath, markedly elevated proBNP, volume overload, peripheral edema, noncompliant with dietary and fluid restriction.  2D echo in 05/2021 had shown EF of 50%. -Received HD on 6/28, 6/29 however HD was limited due to hypotension, declined on 6/30, agreeing for HD today.   -Continue midodrine 10 mg 3 times daily   Active Problems: ESRD with hypervolemia, dependent on HD, TTS -Received HD on 6/28, 6/29, declined on 6/30, agreeing for HD today -Continue  midodrine     Elevated troponin likely due to demand ischemia from volume overload, acute on chronic diastolic CHF -Volume management per HD    Persistent atrial fibrillation (HCC) -Rate controlled, continue to hold diltiazem, metoprolol due to hypotension -Started on midodrine. -Continue eliquis.  Chronic thrombocytopenia (HCC) -Stable    Essential hypertension -BP currently stable, plummets with HD, continue midodrine -Hold beta-blocker, diltiazem    Type 2 diabetes mellitus with nephropathy (HCC) -Hemoglobin A1c 5.3 in 02/2021 -CBG stable, continue SSI     History of CVA (cerebrovascular accident) -Continue Lipitor    Chronic respiratory failure ([place]) -On 2 to 3 L supplemental O2 at baseline, continue    COPD (chronic obstructive pulmonary disease) (HCC) -Currently no coughing or wheezing. -Continue current management  History of peripheral vascular disease -Per patient she had outpatient vascular procedures planned on 6/30 including left lower extremity ABI, arterial duplex Per patient's request, procedures ordered inpatient, hopefully will get done while inpatient. -I spoke with Dr. Stanford Breed with vascular surgery, will follow next week outpatient after discharge.  Code Status: Full code DVT  Prophylaxis:  apixaban (ELIQUIS) tablet 2.5 mg Start: 08/09/21 2330 apixaban (ELIQUIS) tablet 2.5 mg   Level of Care: Level of care: Progressive Family Communication:   Disposition Plan:      Remains inpatient appropriate: Still has volume overload  Procedures:  HD  Consultants:   CCM Nephrology  Antimicrobials:     Medications  apixaban  2.5 mg Oral BID   atorvastatin  40 mg Oral QHS   dextromethorphan-guaiFENesin  1 tablet Oral BID   insulin aspart  0-6 Units Subcutaneous TID WC   midodrine  10 mg Oral TID WC   sevelamer carbonate  0.8 g Oral TID WC   sodium chloride flush  3 mL Intravenous Q12H      Subjective:   Jody [name] was seen and examined  today.  Somewhat upset this morning however no acute issues.  Agreeing for HD today.  Objective:   Vitals:   08/12/21 2032 08/13/21 0152 08/13/21 0410 08/13/21 1204  BP: 111/71 116/80 116/67 113/68  Pulse: 73 71 91 74  Resp: '15 17 17 15  '$ Temp: 97.7 F (36.5 C)  97.7 F (36.5 C) 98.3 F (36.8 C)  TempSrc: Oral  Oral Oral  SpO2: 99% 100% 97% 99%  Weight:   85.2 kg   Height:       No intake or output data in the 24 hours ending 08/13/21 1210    Wt Readings from Last 3 Encounters:  08/13/21 85.2 kg  07/30/21 86.7 kg  07/18/21 89.6 kg   Physical Exam General: Alert and oriented x 3, NAD Cardiovascular: S1 S2 clear, RRR Respiratory: Diminished breath sound at the bases Gastrointestinal: Soft, nontender, nondistended, NBS Ext: 1-2+ pedal edema bilaterally Psych: somewhat frustrated and upset today     Data Reviewed:  I have personally reviewed following labs    CBC Lab Results  Component Value Date   WBC 10.3 08/10/2021   RBC 2.89 (L) 08/10/2021   HGB 9.7 (L) 08/10/2021   HCT 31.3 (L) 08/10/2021   MCV 108.3 (H) 08/10/2021   MCH 33.6 08/10/2021   PLT 84 (L) 08/10/2021   MCHC 31.0 08/10/2021   RDW 18.7 (H) 08/10/2021   LYMPHSABS 0.8 07/25/2021   MONOABS 0.4 07/25/2021   EOSABS 0.1 07/25/2021   BASOSABS 0.0 46/56/8127     Last metabolic panel Lab Results  Component Value Date   NA 135 08/13/2021   K 4.0 08/13/2021   CL 97 (L) 08/13/2021   CO2 25 08/13/2021   BUN 36 (H) 08/13/2021   CREATININE 3.88 (H) 08/13/2021   GLUCOSE 97 08/13/2021   GFRNONAA 11 (L) 08/13/2021   GFRAA 9 (L) 10/27/2019   CALCIUM 9.3 08/13/2021   PHOS 4.1 07/30/2021   PROT 5.0 (L) 08/10/2021   ALBUMIN 2.7 (L) 08/10/2021   LABGLOB 3.9 04/25/2016   AGRATIO 0.8 04/25/2016   BILITOT 1.4 (H) 08/10/2021   ALKPHOS 96 08/10/2021   AST 14 (L) 08/10/2021   ALT 15 08/10/2021   ANIONGAP 13 08/13/2021    CBG (last 3)  Recent Labs    08/12/21 1628 08/12/21 2106 08/13/21 0727   GLUCAP 105* 103* 87             [name] M.D. [facility] 08/13/2021, 12:10 PM  Available via Epic secure chat 7am-7pm After 7 pm, please refer to night coverage provider listed on amion.

## 2021-08-13 NOTE — Progress Notes (Signed)
Received patient in bed, alert and oriented. Informed consent signed and in chart.  Time tx completed:1800  HD treatment completed. Patient tolerated well. Fistula/Graft/HD catheter without signs and symptoms of complications. Patient transported back to the room, alert and orient and in no acute distress. Report given to bedside RN.  Total UF removed:2500  Medication given:none  Post HD VS:122/67  Post HD weight: 82kg

## 2021-08-13 NOTE — Progress Notes (Signed)
Pt refused bipap and no resp distress noted at this time. She was getting upset when asked twice about trying to wear it. RT will continue to monitor.

## 2021-08-13 NOTE — Progress Notes (Signed)
[facility] Progress Note   Subjective:   Upset this AM, feels staff does not check on her frequently enough. Poor historian this AM but denies any shortness of breath or chest pain.   Objective Vitals:   08/12/21 0809 08/12/21 2032 08/13/21 0152 08/13/21 0410  BP: 112/77 111/71 116/80 116/67  Pulse: 66 73 71 91  Resp: '17 15 17 17  '$ Temp: 98.2 F (36.8 C) 97.7 F (36.5 C)  97.7 F (36.5 C)  TempSrc: Oral Oral  Oral  SpO2: 100% 99% 100% 97%  Weight:    85.2 kg  Height:       Physical Exam General: Elderly female, alert and in NAD Heart: RRR, no murmurs, rubs or gallops Lungs: +rales in bilateral bases. Respirations unlabored Abdomen: Soft, non-distended, +BS Extremities: 1+ pitting edema bilateral lower extremities Dialysis Access:  RUE AVF + bruit    Additional Objective Labs: Basic Metabolic Panel: Recent Labs  Lab 08/10/21 0347 08/12/21 0059 08/13/21 0112  NA 134* 134* 135  K 4.5 3.4* 4.0  CL 98 98 97*  CO2 '22 28 25  '$ GLUCOSE 166* 123* 97  BUN 68* 29* 36*  CREATININE 5.26* 3.13* 3.88*  CALCIUM 9.2 8.9 9.3   Liver Function Tests: Recent Labs  Lab 08/10/21 0347  AST 14*  ALT 15  ALKPHOS 96  BILITOT 1.4*  PROT 5.0*  ALBUMIN 2.7*   No results for input(s): "LIPASE", "AMYLASE" in the last 168 hours. CBC: Recent Labs  Lab 08/10/21 0347  WBC 10.3  HGB 9.7*  HCT 31.3*  MCV 108.3*  PLT 84*   Blood Culture    Component Value Date/Time   SDES BLOOD BLOOD LEFT HAND 05/31/2021 1315   SPECREQUEST  05/31/2021 1315    BOTTLES DRAWN AEROBIC ONLY Blood Culture results may not be optimal due to an inadequate volume of blood received in culture bottles   CULT  05/31/2021 1315    NO GROWTH 5 DAYS Performed at [facility], [address], [place], [place] [address]    REPTSTATUS 06/05/2021 FINAL 05/31/2021 1315    Cardiac Enzymes: No results for input(s): "CKTOTAL", "CKMB", "CKMBINDEX", "TROPONINI" in the last 168 hours. CBG: Recent Labs   Lab 08/12/21 0808 08/12/21 1210 08/12/21 1628 08/12/21 2106 08/13/21 0727  GLUCAP 124* 121* 105* 103* 87   Iron Studies: No results for input(s): "IRON", "TIBC", "TRANSFERRIN", "FERRITIN" in the last 72 hours. '@lablastinr3'$ @ Studies/Results: DG Foot Complete Left  Result Date: 08/12/2021 Please see detailed radiograph report in office note.  Medications:  ferric gluconate (FERRLECIT) IVPB Stopped (08/11/21 1743)    apixaban  2.5 mg Oral BID   atorvastatin  40 mg Oral QHS   Chlorhexidine Gluconate Cloth  6 each Topical Q0600   dextromethorphan-guaiFENesin  1 tablet Oral BID   insulin aspart  0-6 Units Subcutaneous TID WC   midodrine  10 mg Oral TID WC   sevelamer carbonate  0.8 g Oral TID WC   sodium chloride flush  3 mL Intravenous Q12H    Dialysis Orders: [facility] TTS  3h 14mn   87kg  400/500   2K bath  RUA AVF  Heparin none - venofer 50 per wk - no other meds    Assessment/Plan: CP/ SOB - Hx of CHF. CXR on 6/28 showed vascular congestion and edema. Pt is below EDW by weight today but not sure if this is accurate. Still with significant edema and SOB last night.Declined extra HD yesterday but agrees to HD today.  ESRD -  on HD TTS at [facility]. Next HD 08/13/2021.  3. Volume - vol overloaded w/ LE edema. Midodrine dose increased but was not given dose before last HD yesterday per nursing note. Hopefully will be able to increase UF today with midodrine pre-HD 4. COPD - on O2 3L at baseline 5. Atrial fib - takes BB and diltiazem, the latter on non HD days. Rate currently controlled.  6. DM2- per primary team.  7. Anemia esrd - Hb 9.7, no esa at OP unit. Will initiate if next Hgb is below goal.  8. MBD ckd - Corrected calcium elevated. Not on VDRA. Last phos at goal, continue binders.   Anice Paganini, PA-C 08/13/2021, 8:48 AM  [facility] Pager: [phone]

## 2021-08-14 ENCOUNTER — Inpatient Hospital Stay (HOSPITAL_COMMUNITY): Payer: Medicare Other

## 2021-08-14 DIAGNOSIS — I5033 Acute on chronic diastolic (congestive) heart failure: Secondary | ICD-10-CM | POA: Diagnosis not present

## 2021-08-14 DIAGNOSIS — J410 Simple chronic bronchitis: Secondary | ICD-10-CM | POA: Diagnosis not present

## 2021-08-14 DIAGNOSIS — I739 Peripheral vascular disease, unspecified: Secondary | ICD-10-CM

## 2021-08-14 DIAGNOSIS — J9610 Chronic respiratory failure, unspecified whether with hypoxia or hypercapnia: Secondary | ICD-10-CM | POA: Diagnosis not present

## 2021-08-14 DIAGNOSIS — I259 Chronic ischemic heart disease, unspecified: Secondary | ICD-10-CM

## 2021-08-14 LAB — GLUCOSE, CAPILLARY
Glucose-Capillary: 140 mg/dL — ABNORMAL HIGH (ref 70–99)
Glucose-Capillary: 157 mg/dL — ABNORMAL HIGH (ref 70–99)
Glucose-Capillary: 149 mg/dL — ABNORMAL HIGH (ref 70–99)
Glucose-Capillary: 175 mg/dL — ABNORMAL HIGH (ref 70–99)

## 2021-08-14 MED ORDER — METOPROLOL TARTRATE 12.5 MG HALF TABLET
12.5000 mg | ORAL_TABLET | Freq: Two times a day (BID) | ORAL | Status: DC
  Administered 2021-08-14 – 2021-08-15 (×3): 12.5 mg via ORAL
  Filled 2021-08-14 (×3): qty 1

## 2021-08-14 NOTE — Progress Notes (Signed)
VASCULAR LAB    Left lower extremity arterial duplex has been performed.  See CV proc for preliminary results.   Yuna Pizzolato, RVT 08/14/2021, 10:51 AM

## 2021-08-14 NOTE — Progress Notes (Signed)
[facility] Progress Note   Subjective:  Noted patient had increased work of breathing last night and worsening CHF on CXR. Recommended extra HD tomorrow and she adamantly refuses, denies she had any SOB today or last night. Denies CP, dizziness, abdominal pain and nausea.   Objective Vitals:   08/13/21 1900 08/13/21 1954 08/14/21 0410 08/14/21 0615  BP: 114/65 114/65 130/72 130/72  Pulse: 91 91 83 (!) 50  Resp: (!) '28 20 19 20  '$ Temp: 97.6 F (36.4 C) 97.6 F (36.4 C) 98 F (36.7 C)   TempSrc: Oral  Oral   SpO2: (!) 83%  92% 98%  Weight:   82.3 kg   Height:       Physical Exam General: Elderly female, alert and in NAD Heart: Mildly tachycardic, regular rhythm, no murmurs, rubs or gallops Lungs: Diminished breath sounds in bases, otherwise CTA Abdomen: Soft, non-distended, +BS Extremities: 1+ pitting edema bilateral lower extremities Dialysis Access:  RUE AVF + bruit  Additional Objective Labs: Basic Metabolic Panel: Recent Labs  Lab 08/10/21 0347 08/12/21 0059 08/13/21 0112  NA 134* 134* 135  K 4.5 3.4* 4.0  CL 98 98 97*  CO2 '22 28 25  '$ GLUCOSE 166* 123* 97  BUN 68* 29* 36*  CREATININE 5.26* 3.13* 3.88*  CALCIUM 9.2 8.9 9.3   Liver Function Tests: Recent Labs  Lab 08/10/21 0347  AST 14*  ALT 15  ALKPHOS 96  BILITOT 1.4*  PROT 5.0*  ALBUMIN 2.7*   No results for input(s): "LIPASE", "AMYLASE" in the last 168 hours. CBC: Recent Labs  Lab 08/10/21 0347  WBC 10.3  HGB 9.7*  HCT 31.3*  MCV 108.3*  PLT 84*   Blood Culture    Component Value Date/Time   SDES BLOOD BLOOD LEFT HAND 05/31/2021 1315   SPECREQUEST  05/31/2021 1315    BOTTLES DRAWN AEROBIC ONLY Blood Culture results may not be optimal due to an inadequate volume of blood received in culture bottles   CULT  05/31/2021 1315    NO GROWTH 5 DAYS Performed at [facility], [address], [place], [place] [address]    REPTSTATUS 06/05/2021 FINAL 05/31/2021 1315    Cardiac  Enzymes: No results for input(s): "CKTOTAL", "CKMB", "CKMBINDEX", "TROPONINI" in the last 168 hours. CBG: Recent Labs  Lab 08/12/21 1628 08/12/21 2106 08/13/21 0727 08/13/21 1905 08/14/21 0734  GLUCAP 105* 103* 87 105* 140*   Iron Studies: No results for input(s): "IRON", "TIBC", "TRANSFERRIN", "FERRITIN" in the last 72 hours. '@lablastinr3'$ @ Studies/Results: DG CHEST PORT 1 VIEW  Result Date: 08/13/2021 CLINICAL DATA:  Respiratory distress EXAM: PORTABLE CHEST 1 VIEW COMPARISON:  08/10/2021 FINDINGS: Cardiac shadow is enlarged but stable. Aortic calcifications are again seen. Vascular congestion with small pleural effusions right greater than left is noted. Basilar atelectasis is seen as well. The overall appearance is slightly progressed when compared with the prior exam. IMPRESSION: Slight worsening in CHF with effusions and basilar atelectasis. Electronically Signed   By: Inez Catalina M.D.   On: 08/13/2021 22:47   DG Foot Complete Left  Result Date: 08/12/2021 Please see detailed radiograph report in office note.  Medications:  ferric gluconate (FERRLECIT) IVPB Stopped (08/11/21 1743)    apixaban  2.5 mg Oral BID   atorvastatin  40 mg Oral QHS   dextromethorphan-guaiFENesin  1 tablet Oral BID   insulin aspart  0-6 Units Subcutaneous TID WC   metoprolol tartrate  12.5 mg Oral BID   midodrine  10 mg Oral TID  WC   sevelamer carbonate  0.8 g Oral TID WC   sodium chloride flush  3 mL Intravenous Q12H    Dialysis Orders: [facility] TTS  3h 44mn   87kg  400/500   2K bath  RUA AVF  Heparin none - venofer 50 per wk - no other meds  Assessment/Plan: 1. CP/ SOB - Hx of CHF. CXR on 6/28 showed vascular congestion and edema.  Still with significant edema and SOB last night.Declined extra HD on Friday and refuses extra HD tomorrow despite being educated about risks.   [facility] Refuses extra HD, see above 3. Volume - vol overloaded w/ LE edema. Below her  outpatient EDW but still with volume excess on CXR and exam, titrate volume down further as tolerated.  4. COPD - on O2 3L at baseline 5. Atrial fib - takes BB and diltiazem, the latter on non HD days.  6. DM2- per primary team.  7. Anemia esrd - Hb 9.7, no esa at OP unit. Will initiate if next Hgb is below goal.  8. MBD ckd - Corrected calcium elevated. Not on VDRA. Last phos at goal, continue binders.    SAnice Paganini PA-C 08/14/2021, 8:42 AM  [facility] Pager: [phone]

## 2021-08-14 NOTE — TOC Progression Note (Signed)
Transition of Care [facility]) - Progression Note    Patient Details  [name] MRN: [id number] Date of Birth: 05-30-1940  Transition of Care [facility]) CM/SW [place], LCSW Phone Number: 08/14/2021, 12:03 PM  Clinical Narrative:     CSW called [facility] to ascertain if pt could return today. CSW was informed that they were informed that pt would not be medically ready this weekend and they would have needed the DC summary by 11:00am. Pt can be DC on Monday.  TOC team will continue to assist with discharge planning needs.   Expected Discharge Plan: [place] Barriers to Discharge: Continued Medical Work up  Expected Discharge Plan and Services Expected Discharge Plan: [place] In-house Referral: Clinical Social Work   Post Acute Care Choice: [place] Living arrangements for the past 2 months: [employer]                                       Social Determinants of Health (SDOH) Interventions    Readmission Risk Interventions    06/07/2021    2:47 PM 06/01/2021   10:59 AM 12/30/2019    1:03 PM  Readmission Risk Prevention Plan  Transportation Screening Complete Complete Complete  HRI or Home Care Consult   Complete  Social Work Consult for [place] Planning/Counseling   Complete  Palliative Care Screening   Not Complete  Medication Review [employer]) Complete Complete Complete  PCP or Specialist appointment within 3-5 days of discharge Complete    HRI or Home Care Consult Complete Complete   SW Recovery Care/Counseling Consult Complete Complete   Palliative Care Screening Complete Not Applicable   Skilled Nursing Facility Complete Complete

## 2021-08-14 NOTE — Progress Notes (Addendum)
[facility]                                                                              Jody [name], is a 81 y.o. female, DOB - 1940-12-01, ZDG:[id number] Admit date - 08/09/2021    Outpatient Primary MD for the patient is Monico Blitz, MD  LOS - 5  days  Chief Complaint  Patient presents with   Flank Pain    Renal Failure       Brief summary   Patient is a 81 year old female with atrial fibrillation on Eliquis, DM type II, HTN, COPD, chronic hypoxic respiratory failure, chronic diastolic CHF, ESRD on HD, TTS presented with chest pain and shortness of breath.  Patient reported that she was given a whole pill at the nursing home and night before the admission when she usually takes some crushed in applesauce dropped some central chest pain while trying to get the pill down.  Chest pain has resolved.  Patient was also complaining of shortness of breath hence was sent to ED.  Patient reported difficulty adhering to fluid restrictions.  Her niece noted that patient often presents to the ED on Monday or Tuesday after friends and family brings her food over the weekend.  At [facility] ED,Troponin was 100 initially (normal <34), then 84, then 71.  proBNP was >70,000.  Chest x-ray was read as mild CHF with low volumes.    Assessment & Plan    Principal Problem:   Acute on chronic diastolic CHF (congestive heart failure) (HCC) with volume overload complicated with hypotension -Presented with chest pain, shortness of breath, markedly elevated proBNP, volume overload, peripheral edema, noncompliant with dietary and fluid restriction.  2D echo in 05/2021 had shown EF of 50%. -Received HD on 6/28, 6/29, 7/1, nephrology following for HD -Continue midodrine 10 mg 3 times daily   Active Problems: ESRD with hypervolemia, dependent on HD, TTS -HD schedule per nephrology per nephrology -Continue midodrine    Elevated troponin likely due to demand ischemia from volume  overload, acute on chronic diastolic CHF -Volume management per HD    Persistent atrial fibrillation ([place]) with tachycardia -Continue midodrine, noted to have elevated heart rate in low 100s -Resumed metoprolol 12.5 mg BID, continue eliquis  Chronic thrombocytopenia ([place]) -Stable    Essential hypertension -BP currently stable, continue midodrine Resumed beta-blocker due to tachycardia    Type 2 diabetes mellitus with nephropathy (HCC) -Hemoglobin A1c 5.3 in 02/2021 -CBG stable, continue SSI     History of CVA (cerebrovascular accident) -Continue Lipitor    Chronic respiratory failure ([place]) -On 2 to 3 L supplemental O2 at baseline, continue    COPD (chronic obstructive pulmonary disease) (HCC) -Currently no coughing or wheezing. -Continue current management  History of peripheral vascular disease -ABI, arterial duplex left lower extremity completed.  Outpatient follow-up with vascular surgery next week   Code Status: Full code DVT Prophylaxis:  apixaban (ELIQUIS) tablet 2.5 mg Start: 08/09/21 2330 apixaban (ELIQUIS) tablet 2.5 mg   Level of Care: Level of care: Progressive Family Communication:   Disposition Plan:      Remains inpatient appropriate: Plan for DC to  SNF in a.m.   Procedures:  HD  Consultants:   [facility] Nephrology  Antimicrobials:     Medications  apixaban  2.5 mg Oral BID   atorvastatin  40 mg Oral QHS   dextromethorphan-guaiFENesin  1 tablet Oral BID   insulin aspart  0-6 Units Subcutaneous TID WC   metoprolol tartrate  12.5 mg Oral BID   midodrine  10 mg Oral TID WC   sevelamer carbonate  0.8 g Oral TID WC   sodium chloride flush  3 mL Intravenous Q12H      Subjective:   Jody [name] was seen and examined today.  No acute complaints, want to be discharged, facility declined taking her today  Objective:   Vitals:   08/14/21 0922 08/14/21 1030 08/14/21 1045 08/14/21 1217  BP:    119/61  Pulse: 100 93 82 80  Resp: (!) 22 (!) 34  19 19  Temp:    98.6 F (37 C)  TempSrc:    Oral  SpO2: 92% 95% 95% 90%  Weight:      Height:        Intake/Output Summary (Last 24 hours) at 08/14/2021 1258 Last data filed at 08/14/2021 0900 Gross per 24 hour  Intake 120 ml  Output 2500 ml  Net -2380 ml      Wt Readings from Last 3 Encounters:  08/14/21 82.3 kg  07/30/21 86.7 kg  07/18/21 89.6 kg    Physical Exam General: Alert and oriented x 3, NAD 1 Cardiovascular: Irregularly irregular, tachycardia Respiratory: Diminished breath sound at the bases Gastrointestinal: Soft, nontender, nondistended, NBS Ext: 1+ pedal edema bilaterally Neuro: no new deficits Skin: No rashes Psych: Normal affect and demeanor, alert and oriented x3      Data Reviewed:  I have personally reviewed following labs    CBC Lab Results  Component Value Date   WBC 10.3 08/10/2021   RBC 2.89 (L) 08/10/2021   HGB 9.7 (L) 08/10/2021   HCT 31.3 (L) 08/10/2021   MCV 108.3 (H) 08/10/2021   MCH 33.6 08/10/2021   PLT 84 (L) 08/10/2021   MCHC 31.0 08/10/2021   RDW 18.7 (H) 08/10/2021   LYMPHSABS 0.8 07/25/2021   MONOABS 0.4 07/25/2021   EOSABS 0.1 07/25/2021   BASOSABS 0.0 16/11/9602     Last metabolic panel Lab Results  Component Value Date   NA 135 08/13/2021   K 4.0 08/13/2021   CL 97 (L) 08/13/2021   CO2 25 08/13/2021   BUN 36 (H) 08/13/2021   CREATININE 3.88 (H) 08/13/2021   GLUCOSE 97 08/13/2021   GFRNONAA 11 (L) 08/13/2021   GFRAA 9 (L) 10/27/2019   CALCIUM 9.3 08/13/2021   PHOS 4.1 07/30/2021   PROT 5.0 (L) 08/10/2021   ALBUMIN 2.7 (L) 08/10/2021   LABGLOB 3.9 04/25/2016   AGRATIO 0.8 04/25/2016   BILITOT 1.4 (H) 08/10/2021   ALKPHOS 96 08/10/2021   AST 14 (L) 08/10/2021   ALT 15 08/10/2021   ANIONGAP 13 08/13/2021    CBG (last 3)  Recent Labs    08/13/21 1905 08/14/21 0734 08/14/21 1215  GLUCAP 105* 140* 157*        Jadrien Narine M.D. [facility] 08/14/2021, 12:58 PM  Available via Epic secure  chat 7am-7pm After 7 pm, please refer to night coverage provider listed on amion.

## 2021-08-14 NOTE — Progress Notes (Signed)
Subjective: 81 year old female presents the office today for follow-up evaluation after undergoing partial first ray amputation on the left side.  She denies any fevers or chills.  She says she recently has been hospitalized for pneumonia and she feels tired but otherwise been doing well.  Objective: AAO x3, NAD Status post partial first amputation left foot.  At the distal portion of the incision there is a fibrogranular, necrotic wound present prior to debridement.  There is mild edema but there is no significant cellulitis.  No fluctuance or crepitation but there is no malodor. No pain with calf compression, swelling, warmth, erythema      Assessment: Status post partial first amputation, PAD  Plan: -All treatment options discussed with the patient including all alternatives, risks, complications.  -X-rays were obtained reviewed.  No definitive cortical destruction suggestive of osteophytes at this time.  Previous partial first ray amputation. -Sharp debrided the wound today utilizing for 312 with scalpel down to healthy, bleeding tissue. -Recommend continue with daily dressing changes.  Continue offloading.  Consider switching to Santyl.  If no improvement referral to wound care center. -Monitor for any clinical signs or symptoms of infection and directed to call the office immediately should any occur or go to the ER.  Trula Slade DPM

## 2021-08-14 NOTE — Progress Notes (Signed)
VASCULAR LAB    ABI has been performed.  See CV proc for preliminary results.   Chynah Orihuela, RVT 08/14/2021, 10:44 AM

## 2021-08-15 ENCOUNTER — Ambulatory Visit: Payer: Medicare Other

## 2021-08-15 DIAGNOSIS — J410 Simple chronic bronchitis: Secondary | ICD-10-CM | POA: Diagnosis not present

## 2021-08-15 DIAGNOSIS — I259 Chronic ischemic heart disease, unspecified: Secondary | ICD-10-CM | POA: Diagnosis not present

## 2021-08-15 DIAGNOSIS — I5033 Acute on chronic diastolic (congestive) heart failure: Secondary | ICD-10-CM | POA: Diagnosis not present

## 2021-08-15 DIAGNOSIS — J9610 Chronic respiratory failure, unspecified whether with hypoxia or hypercapnia: Secondary | ICD-10-CM | POA: Diagnosis not present

## 2021-08-15 LAB — GLUCOSE, CAPILLARY
Glucose-Capillary: 143 mg/dL — ABNORMAL HIGH (ref 70–99)
Glucose-Capillary: 149 mg/dL — ABNORMAL HIGH (ref 70–99)
Glucose-Capillary: 224 mg/dL — ABNORMAL HIGH (ref 70–99)

## 2021-08-15 NOTE — Progress Notes (Addendum)
[facility] Progress Note   Subjective:  Seen in room. Denies CP/dyspnea. Had previously offered extra HD today, she still doesn't want it. Is hoping to discharge today.   Objective Vitals:   08/15/21 0013 08/15/21 0300 08/15/21 0500 08/15/21 0800  BP: 115/88 134/86    Pulse: 99 (!) 29    Resp: '16 16  14  '$ Temp:  98.6 F (37 C)    TempSrc:  Axillary    SpO2: 99% 95%  97%  Weight:   81.6 kg   Height:       Physical Exam General: Elderly woman, NAD. Nasal O2 in place. Heart: RRR; 2/6 murmur Lungs: CTA anteriorly Abdomen:soft Extremities: 1+ BLE edema Dialysis Access: RUE AVF + bruit  Additional Objective Labs: Basic Metabolic Panel: Recent Labs  Lab 08/10/21 0347 08/12/21 0059 08/13/21 0112  NA 134* 134* 135  K 4.5 3.4* 4.0  CL 98 98 97*  CO2 '22 28 25  '$ GLUCOSE 166* 123* 97  BUN 68* 29* 36*  CREATININE 5.26* 3.13* 3.88*  CALCIUM 9.2 8.9 9.3   Liver Function Tests: Recent Labs  Lab 08/10/21 0347  AST 14*  ALT 15  ALKPHOS 96  BILITOT 1.4*  PROT 5.0*  ALBUMIN 2.7*   CBC: Recent Labs  Lab 08/10/21 0347  WBC 10.3  HGB 9.7*  HCT 31.3*  MCV 108.3*  PLT 84*   Studies/Results: VAS [place] LOWER EXTREMITY ARTERIAL DUPLEX  Result Date: 08/14/2021 LOWER EXTREMITY ARTERIAL DUPLEX STUDY Patient Name:  MALINDA MAYDEN  Date of Exam:   08/14/2021 Medical Rec #: [id number]        Accession #:    [id number] Date of Birth: 19-Jan-1941        Patient Gender: F Patient Age:   53 years Exam Location:  [facility] Procedure:      VAS [place] LOWER EXTREMITY ARTERIAL DUPLEX Referring Phys: RIPUDEEP RAI --------------------------------------------------------------------------------  Indications: Peripheral artery disease. High Risk Factors: Hypertension, hyperlipidemia, Diabetes, past history of                    smoking, prior CVA. Other Factors: Heart failure, ESRD on dialysis.  Vascular Interventions: Left great toe amputation 04/08/21. SFA                          drug-coated balloon angioplasty 04/06/21. Current ABI:            Bilateral: non compressible. Right TBI 0.61 Limitations: Arrythmia, massive edema. Comparison Study: No prior LEA on file Performing Technologist: Sharion Dove RVS  Examination Guidelines: A complete evaluation includes B-mode imaging, spectral Doppler, color Doppler, and power Doppler as needed of all accessible portions of each vessel. Bilateral testing is considered an integral part of a complete examination. Limited examinations for reoccurring indications may be performed as noted.  +-----------+--------+-----+--------+------------------------------+-----------+ LEFT       PSV cm/sRatioStenosisWaveform                      Comments    +-----------+--------+-----+--------+------------------------------+-----------+ CFA Prox   88                   monophasic                                +-----------+--------+-----+--------+------------------------------+-----------+ DFA        39  monophasic                                +-----------+--------+-----+--------+------------------------------+-----------+ SFA Prox   64                   monophasic                                +-----------+--------+-----+--------+------------------------------+-----------+ SFA Mid    68                   monophasic                    collateral? +-----------+--------+-----+--------+------------------------------+-----------+ SFA Distal 15                   monophasic                                +-----------+--------+-----+--------+------------------------------+-----------+ POP Prox   62                   monophasic                                +-----------+--------+-----+--------+------------------------------+-----------+ POP Distal 45                   monophasic                                +-----------+--------+-----+--------+------------------------------+-----------+ ATA Prox    52                   monophasic                                +-----------+--------+-----+--------+------------------------------+-----------+ ATA Mid    28                   monophasic                                +-----------+--------+-----+--------+------------------------------+-----------+ ATA Distal 61                   monophasic                                +-----------+--------+-----+--------+------------------------------+-----------+ PTA Prox                occluded                                          +-----------+--------+-----+--------+------------------------------+-----------+ PTA Mid                 occluded                                          +-----------+--------+-----+--------+------------------------------+-----------+ PTA Distal  occluded                                          +-----------+--------+-----+--------+------------------------------+-----------+ PERO Prox                       not visualized secondary to                                               edema/postioning                          +-----------+--------+-----+--------+------------------------------+-----------+ PERO Mid                        not visualized secondary to                                               edema/postioning                          +-----------+--------+-----+--------+------------------------------+-----------+ PERO Distal                     not visualized secondary to                                               edema/postioning                          +-----------+--------+-----+--------+------------------------------+-----------+  Summary: Left: Monophasic waveforms are noted throughout this technically difficult/limited study. No areas of stenosis noted in visualized arteries.  See table(s) above for measurements and observations. Electronically signed by Jamelle Haring on 08/14/2021 at  12:23:25 PM.    Final    VAS [place] ABI WITH/WO TBI  Result Date: 08/14/2021  LOWER EXTREMITY DOPPLER STUDY Patient Name:  FERNE ELLINGWOOD  Date of Exam:   08/14/2021 Medical Rec #: [id number]        Accession #:    [id number] Date of Birth: Mar 28, 1940        Patient Gender: F Patient Age:   47 years Exam Location:  [facility] Procedure:      VAS [place] ABI WITH/WO TBI Referring Phys: RIPUDEEP RAI --------------------------------------------------------------------------------  Indications: Peripheral artery disease. High Risk Factors: Hypertension, Diabetes, past history of smoking. Other Factors: Heart failure, ESRD on dialysis.  Vascular Interventions: Left great toe amputation 04/08/21. SFA drug-coated                         balloon angioplasty 04/06/21. Limitations: Today's exam was limited due to Arrythmia. Restricted right arm              secondary to dialysis access. Comparison Study: Prior ABI done 05/11/21 Performing Technologist: Sharion Dove RVS  Examination Guidelines: A complete evaluation includes at minimum, Doppler waveform signals and systolic blood pressure reading at the level of bilateral brachial, anterior tibial, and posterior  tibial arteries, when vessel segments are accessible. Bilateral testing is considered an integral part of a complete examination. Photoelectric Plethysmograph (PPG) waveforms and toe systolic pressure readings are included as required and additional duplex testing as needed. Limited examinations for reoccurring indications may be performed as noted.  ABI Findings: +---------+------------------+-----+----------+----------+ Right    Rt Pressure (mmHg)IndexWaveform  Comment    +---------+------------------+-----+----------+----------+ Brachial                                  restricted +---------+------------------+-----+----------+----------+ ATA      254               1.81 monophasic           +---------+------------------+-----+----------+----------+  DP       254               1.81 monophasic           +---------+------------------+-----+----------+----------+ Great Toe86                0.61                      +---------+------------------+-----+----------+----------+ +---------+------------------+-----+-----------+-------------------------------+ Left     Lt Pressure (mmHg)IndexWaveform   Comment                         +---------+------------------+-----+-----------+-------------------------------+ Brachial 140                    multiphasic                                +---------+------------------+-----+-----------+-------------------------------+ PTA                             absent                                     +---------+------------------+-----+-----------+-------------------------------+ PERO                                       unable to attain secondary to                                              postioning and edema            +---------+------------------+-----+-----------+-------------------------------+ DP       254               1.81                                            +---------+------------------+-----+-----------+-------------------------------+ Great Toe                                  amputation                      +---------+------------------+-----+-----------+-------------------------------+ +-------+----------------+-----------+----------------+------------+ ABI/TBIToday's ABI  Today's TBIPrevious ABI    Previous TBI +-------+----------------+-----------+----------------+------------+ Right  non compressible0.61       non compressible0.61         +-------+----------------+-----------+----------------+------------+ Left   non compressibleamputation non compressibleamputation   +-------+----------------+-----------+----------------+------------+ Bilateral ABIs appear essentially unchanged compared to prior study on 05/11/21. Right TBIs  appear essentially unchanged compared to prior study on 05/11/21.  Summary: Right: Resting right ankle-brachial index indicates noncompressible right lower extremity arteries. The right toe-brachial index is abnormal. Left: Resting left ankle-brachial index indicates noncompressible left lower extremity arteries. *See table(s) above for measurements and observations.  Electronically signed by Jamelle Haring on 08/14/2021 at 12:23:06 PM.    Final    DG CHEST PORT 1 VIEW  Result Date: 08/13/2021 CLINICAL DATA:  Respiratory distress EXAM: PORTABLE CHEST 1 VIEW COMPARISON:  08/10/2021 FINDINGS: Cardiac shadow is enlarged but stable. Aortic calcifications are again seen. Vascular congestion with small pleural effusions right greater than left is noted. Basilar atelectasis is seen as well. The overall appearance is slightly progressed when compared with the prior exam. IMPRESSION: Slight worsening in CHF with effusions and basilar atelectasis. Electronically Signed   By: Inez Catalina M.D.   On: 08/13/2021 22:47    Medications:  ferric gluconate (FERRLECIT) IVPB Stopped (08/11/21 1743)    apixaban  2.5 mg Oral BID   atorvastatin  40 mg Oral QHS   dextromethorphan-guaiFENesin  1 tablet Oral BID   insulin aspart  0-6 Units Subcutaneous TID WC   metoprolol tartrate  12.5 mg Oral BID   midodrine  10 mg Oral TID WC   sevelamer carbonate  0.8 g Oral TID WC   sodium chloride flush  3 mL Intravenous Q12H    Dialysis Orders: [facility]  3h 55mn   87kg  400/500   2K bath  RUA AVF  Heparin none - venofer 50 per wk - no other meds   Assessment/Plan: CP/Dyspnea/Pulm edema - Hx of CHF. CXR 6/28 showed vascular congestion and edema. Declined extra HD despite being educated about risks.   [facility], see above. HD tomorrow, either here or as outpatient. 3.   HTN/Volume: Well below prior EDW with ongoing edema -> continue to challenge, and will lower EDW on discharge. 4.  COPD - on O2 3L at baseline 5. Atrial fibrillation: Now on metoprolol only. 6. DM2- per primary team.  7. Anemia of ESRD - Hb 9.7, no esa at OP unit. Will initiate if next Hgb is below goal.  8. MBD of ESRD - Corrected calcium elevated. Not on VDRA. Last phos at goal, continue binders.   [address] [place] from renal standpoint  KVeneta Penton PA-C 08/15/2021, 10:07 AM  [facility]  I have seen and examined this patient and agree with plan and assessment in the above note with renal recommendations/intervention highlighted. Pt is anxious to be discharged and awaiting on transportation. JBroadus JohnA Arijana Narayan,MD 08/15/2021 12:07 PM

## 2021-08-15 NOTE — TOC Transition Note (Signed)
Transition of Care [facility]) - CM/SW Discharge Note   Patient Details  [name] MRN: [id number] Date of Birth: [birth year]  Transition [facility] CM/SW Contact:  Milas Gain, [place] Phone Number: 08/15/2021, 11:28 AM   Clinical Narrative:     Patient will DC to: [facility]   Anticipated DC date: 08/15/2021  Family notified: [name]   Transport by: Corey Harold  ?  Per MD patient ready for DC to [facility] . RN, patient, Olivia Mackie Renal Navigator,patient's family, and facility notified of DC. Discharge Summary sent to facility. RN given number for report tele# [phone] RM# Soldier packet on chart. Ambulance transport requested for patient.  CSW signing off.   Final next level of care: Skilled Nursing Facility Barriers to Discharge: No Barriers Identified   Patient Goals and CMS Choice Patient states their goals for this hospitalization and ongoing recovery are:: SNF CMS Medicare.gov Compare Post Acute Care list provided to:: Patient Choice offered to / list presented to : Patient  Discharge Placement              Patient chooses bed at:  [facility]) Patient to be transferred to facility by: [place] Name of family member notified: [name] Patient and family notified of of transfer: 08/15/21  Discharge Plan and Services In-house Referral: Clinical Social Work   Post Acute Care Choice: [place]                               Social Determinants of Health (SDOH) Interventions     Readmission Risk Interventions    06/07/2021    2:47 PM 06/01/2021   10:59 AM 12/30/2019    1:03 PM  Readmission Risk Prevention Plan  Transportation Screening Complete Complete Complete  HRI or Home Care Consult   Complete  Social Work Consult for [place] Planning/Counseling   Complete  Palliative Care Screening   Not Complete  Medication Review Press photographer) Complete Complete Complete  PCP or Specialist appointment within 3-5  days of discharge Complete    HRI or Home Care Consult Complete Complete   SW Recovery Care/Counseling Consult Complete Complete   Palliative Care Screening Complete Not Applicable   Skilled Nursing Facility Complete Complete

## 2021-08-15 NOTE — Discharge Summary (Signed)
Physician Discharge Summary   Patient: Jody [name] MRN: [id number] DOB: 09-15-1940  Admit date:     08/09/2021  Discharge date: 08/15/21  Discharge Physician: Estill Cotta, MD   PCP: Monico Blitz, MD   Recommendations at discharge:   Cardizem held Started on midodrine 10 mg 3 times daily  Discharge Diagnoses:   Acute on chronic diastolic CHF (congestive heart failure) (HCC)   Elevated troponin due to demand ischemia   Persistent atrial fibrillation (HCC)   ESRD on dialysis ([place])   Thrombocytopenia (HCC)   Essential hypertension   Type 2 diabetes mellitus with nephropathy ([place])   History of CVA (cerebrovascular accident)   Chronic respiratory failure (HCC)   COPD (chronic obstructive pulmonary disease) (HCC)   Hypotension Peripheral vascular disease   Hospital Course: Patient is a 81 year old female with atrial fibrillation on Eliquis, DM type II, HTN, COPD, chronic hypoxic respiratory failure, chronic diastolic CHF, ESRD on HD, TTS presented with chest pain and shortness of breath.  Patient reported that she was given a whole pill at the nursing home and night before the admission when she usually takes some crushed in applesauce dropped some central chest pain while trying to get the pill down.  Chest pain has resolved.  Patient was also complaining of shortness of breath hence was sent to ED.  Patient reported difficulty adhering to fluid restrictions.  Her niece noted that patient often presents to the ED on Monday or Tuesday after friends and family brings her food over the weekend.   At [facility] ED,Troponin was 100 initially (normal <34), then 84, then 71.  proBNP was >70,000.  Chest x-ray was read as mild CHF with low volumes.     Assessment and Plan:  Acute on chronic diastolic CHF (congestive heart failure) (HCC) with volume overload complicated with hypotension -Presented with chest pain, shortness of breath, markedly elevated proBNP, volume overload,  peripheral edema, noncompliant with dietary and fluid restriction.  2D echo in 05/2021 had shown EF of 50%. -Received HD on 6/28, 6/29, 7/1,.  Patient was followed by nephrology.  Continue HD for volume management -Continue midodrine 10 mg 3 times daily     ESRD with hypervolemia, dependent on HD, TTS -Continue hemodialysis per her schedule, TTS -Continue midodrine     Elevated troponin likely due to demand ischemia from volume overload, acute on chronic diastolic CHF -Volume management per HD     Persistent atrial fibrillation (HCC) with tachycardia -Continue midodrine, noted to have elevated heart rate in low 100s -Resumed metoprolol 12.5 mg BID, continue eliquis   Chronic thrombocytopenia ([place]) -Stable     Essential hypertension -BP currently stable, continue midodrine Resumed beta-blocker due to tachycardia     Type 2 diabetes mellitus with nephropathy (HCC) -Hemoglobin A1c 5.3 in 02/2021      History of CVA (cerebrovascular accident) -Continue Lipitor     Chronic respiratory failure ([place]) -On 2 to 3 L supplemental O2 at baseline, continue     COPD (chronic obstructive pulmonary disease) (HCC) -Currently no coughing or wheezing. -Continue current management   History of peripheral vascular disease -ABI, arterial duplex left lower extremity completed.  Outpatient follow-up with vascular surgery.    Pain control - [employer] Controlled Substance Reporting System database was reviewed. and patient was instructed, not to drive, operate heavy machinery, perform activities at heights, swimming or participation in water activities or provide baby-sitting services while on Pain, Sleep and Anxiety Medications; until their outpatient Physician has advised to do so  again. Also recommended to not to take more than prescribed Pain, Sleep and Anxiety Medications.  Consultants: Nephrology Procedures performed: Hemodialysis Disposition: Skilled nursing facility Diet  recommendation:  Discharge Diet Orders (From admission, onward)     Start     Ordered   08/15/21 0000  Diet - low sodium heart healthy        08/15/21 1021           Carb modified diet DISCHARGE MEDICATION: Allergies as of 08/15/2021       Reactions   Codeine Nausea And Vomiting        Medication List     STOP taking these medications    diltiazem 120 MG tablet Commonly known as: CARDIZEM   doxycycline 100 MG EC tablet Commonly known as: DORYX   furosemide 40 MG tablet Commonly known as: LASIX       TAKE these medications    acetaminophen 325 MG tablet Commonly known as: TYLENOL Take 325 mg by mouth 3 (three) times daily.   albuterol (2.5 MG/3ML) 0.083% nebulizer solution Commonly known as: PROVENTIL Take 2.5 mg by nebulization every 6 (six) hours as needed for wheezing or shortness of breath. What changed: Another medication with the same name was changed. Make sure you understand how and when to take each.   albuterol 108 (90 Base) MCG/ACT inhaler Commonly known as: VENTOLIN HFA Inhale 2 puffs into the lungs every 6 (six) hours as needed for wheezing or shortness of breath. Reported on 03/01/2015 What changed: how much to take   apixaban 2.5 MG Tabs tablet Commonly known as: ELIQUIS Take 2.5 mg by mouth 2 (two) times daily.   atorvastatin 40 MG tablet Commonly known as: LIPITOR Take 1 tablet by mouth at bedtime.   dextromethorphan-guaiFENesin 30-600 MG 12hr tablet Commonly known as: MUCINEX DM Take 1 tablet by mouth 2 (two) times daily.   feeding supplement (NEPRO CARB STEADY) Liqd Take 237 mLs by mouth 2 (two) times daily between meals.   ferrous sulfate 325 (65 FE) MG tablet Take 325 mg by mouth 2 (two) times daily with a meal.   folic acid 716 MCG tablet Commonly known as: FOLVITE Take 800 mcg by mouth daily.   gabapentin 300 MG capsule Commonly known as: NEURONTIN Take 300 mg by mouth daily.   metoprolol tartrate 25 MG  tablet Commonly known as: LOPRESSOR Take 0.5 tablets (12.5 mg total) by mouth 2 (two) times daily.   midodrine 10 MG tablet Commonly known as: PROAMATINE Take 10 mg by mouth Every Tuesday,Thursday,and Saturday with dialysis.   PreserVision AREDS Tabs Take 1 tablet by mouth at bedtime.   sevelamer carbonate 800 MG tablet Commonly known as: RENVELA Take 800 mg by mouth 3 (three) times daily with meals.   vitamin B-12 1000 MCG tablet Commonly known as: CYANOCOBALAMIN Take 1,000 mcg by mouth daily.               Discharge Care Instructions  (From admission, onward)           Start     Ordered   08/15/21 0000  Discharge wound care:       Comments: Pressure Injury 04/13/21 Thigh Anterior;Right;Medial Stage 2 -  Partial thickness loss of dermis presenting as a shallow open injury with a red, pink wound bed without slough. area of broken skin on inner thigh.   08/15/21 1021            Contact information for follow-up providers  Monico Blitz, MD. Schedule an appointment as soon as possible for a visit in 2 week(s).   Specialty: Internal Medicine Why: for hospital follow-up Contact information: [place] [address] [phone]         Minus Breeding, MD .   Specialty: Cardiology Contact information: [place] [address] [phone]              Contact information for after-discharge care     Destination     [facility] .   Service: Skilled Nursing Contact information: [address] [place] [phone]                    Discharge Exam: Danley Danker Weights   08/13/21 1802 08/14/21 0410 08/15/21 0500  Weight: 82 kg 82.3 kg 81.6 kg   S: Doing well, no acute issues overnight, wants to be discharged  Vitals:   08/15/21 0013 08/15/21 0300 08/15/21 0500 08/15/21 0800  BP: 115/88 134/86    Pulse: 99 (!) 29    Resp: '16  16  14  '$ Temp:  98.6 F (37 C)    TempSrc:  Axillary    SpO2: 99% 95%  97%  Weight:   81.6 kg   Height:        Physical Exam General: Alert and oriented x 3, NAD Cardiovascular: Irregularly irregular Respiratory: Diminished breath sound at the bases Gastrointestinal: Soft, nontender, nondistended, NBS Ext: 1+ LE pedal edema bilaterally Neuro: no new deficits Skin: No rashes Psych: Normal affect and demeanor, alert and oriented x3    Condition at discharge: fair  The results of significant diagnostics from this hospitalization (including imaging, microbiology, ancillary and laboratory) are listed below for reference.   Imaging Studies: VAS [place] LOWER EXTREMITY ARTERIAL DUPLEX  Result Date: 08/14/2021 LOWER EXTREMITY ARTERIAL DUPLEX STUDY Patient Name:  ANIYLA HARLING  Date of Exam:   08/14/2021 Medical Rec #: [id number]        Accession #:    [id number] Date of Birth: April 20, 1940        Patient Gender: F Patient Age:   6 years Exam Location:  [facility] Procedure:      VAS [place] LOWER EXTREMITY ARTERIAL DUPLEX Referring Phys: Davyon Fisch --------------------------------------------------------------------------------  Indications: Peripheral artery disease. High Risk Factors: Hypertension, hyperlipidemia, Diabetes, past history of                    smoking, prior CVA. Other Factors: Heart failure, ESRD on dialysis.  Vascular Interventions: Left great toe amputation 04/08/21. SFA                         drug-coated balloon angioplasty 04/06/21. Current ABI:            Bilateral: non compressible. Right TBI 0.61 Limitations: Arrythmia, massive edema. Comparison Study: No prior LEA on file Performing Technologist: Sharion Dove RVS  Examination Guidelines: A complete evaluation includes B-mode imaging, spectral Doppler, color Doppler, and power Doppler as needed of all accessible portions of each vessel. Bilateral testing is considered an integral part of a complete examination. Limited  examinations for reoccurring indications may be performed as noted.  +-----------+--------+-----+--------+------------------------------+-----------+ LEFT       PSV cm/sRatioStenosisWaveform                      Comments    +-----------+--------+-----+--------+------------------------------+-----------+ CFA Prox  88                   monophasic                                +-----------+--------+-----+--------+------------------------------+-----------+ DFA        39                   monophasic                                +-----------+--------+-----+--------+------------------------------+-----------+ SFA Prox   64                   monophasic                                +-----------+--------+-----+--------+------------------------------+-----------+ SFA Mid    68                   monophasic                    collateral? +-----------+--------+-----+--------+------------------------------+-----------+ SFA Distal 15                   monophasic                                +-----------+--------+-----+--------+------------------------------+-----------+ POP Prox   62                   monophasic                                +-----------+--------+-----+--------+------------------------------+-----------+ POP Distal 45                   monophasic                                +-----------+--------+-----+--------+------------------------------+-----------+ ATA Prox   52                   monophasic                                +-----------+--------+-----+--------+------------------------------+-----------+ ATA Mid    28                   monophasic                                +-----------+--------+-----+--------+------------------------------+-----------+ ATA Distal 61                   monophasic                                +-----------+--------+-----+--------+------------------------------+-----------+ PTA Prox                 occluded                                          +-----------+--------+-----+--------+------------------------------+-----------+  PTA Mid                 occluded                                          +-----------+--------+-----+--------+------------------------------+-----------+ PTA Distal              occluded                                          +-----------+--------+-----+--------+------------------------------+-----------+ PERO Prox                       not visualized secondary to                                               edema/postioning                          +-----------+--------+-----+--------+------------------------------+-----------+ PERO Mid                        not visualized secondary to                                               edema/postioning                          +-----------+--------+-----+--------+------------------------------+-----------+ PERO Distal                     not visualized secondary to                                               edema/postioning                          +-----------+--------+-----+--------+------------------------------+-----------+  Summary: Left: Monophasic waveforms are noted throughout this technically difficult/limited study. No areas of stenosis noted in visualized arteries.  See table(s) above for measurements and observations. Electronically signed by Jamelle Haring on 08/14/2021 at 12:23:25 PM.    Final    VAS [place] ABI WITH/WO TBI  Result Date: 08/14/2021  LOWER EXTREMITY DOPPLER STUDY Patient Name:  SHELENA CASTELLUCCIO  Date of Exam:   08/14/2021 Medical Rec #: [id number]        Accession #:    [id number] Date of Birth: May 06, 1940        Patient Gender: F Patient Age:   54 years Exam Location:  [facility] Procedure:      VAS [place] ABI WITH/WO TBI Referring Phys: Doss Cybulski --------------------------------------------------------------------------------   Indications: Peripheral artery disease. High Risk Factors: Hypertension, Diabetes, past history of smoking. Other Factors: Heart failure, ESRD on dialysis.  Vascular Interventions: Left great toe amputation 04/08/21. SFA drug-coated  balloon angioplasty 04/06/21. Limitations: Today's exam was limited due to Arrythmia. Restricted right arm              secondary to dialysis access. Comparison Study: Prior ABI done 05/11/21 Performing Technologist: Sharion Dove RVS  Examination Guidelines: A complete evaluation includes at minimum, Doppler waveform signals and systolic blood pressure reading at the level of bilateral brachial, anterior tibial, and posterior tibial arteries, when vessel segments are accessible. Bilateral testing is considered an integral part of a complete examination. Photoelectric Plethysmograph (PPG) waveforms and toe systolic pressure readings are included as required and additional duplex testing as needed. Limited examinations for reoccurring indications may be performed as noted.  ABI Findings: +---------+------------------+-----+----------+----------+ Right    Rt Pressure (mmHg)IndexWaveform  Comment    +---------+------------------+-----+----------+----------+ Brachial                                  restricted +---------+------------------+-----+----------+----------+ ATA      254               1.81 monophasic           +---------+------------------+-----+----------+----------+ DP       254               1.81 monophasic           +---------+------------------+-----+----------+----------+ Great Toe86                0.61                      +---------+------------------+-----+----------+----------+ +---------+------------------+-----+-----------+-------------------------------+ Left     Lt Pressure (mmHg)IndexWaveform   Comment                         +---------+------------------+-----+-----------+-------------------------------+  Brachial 140                    multiphasic                                +---------+------------------+-----+-----------+-------------------------------+ PTA                             absent                                     +---------+------------------+-----+-----------+-------------------------------+ PERO                                       unable to attain secondary to                                              postioning and edema            +---------+------------------+-----+-----------+-------------------------------+ DP       254               1.81                                            +---------+------------------+-----+-----------+-------------------------------+  Great Toe                                  amputation                      +---------+------------------+-----+-----------+-------------------------------+ +-------+----------------+-----------+----------------+------------+ ABI/TBIToday's ABI     Today's TBIPrevious ABI    Previous TBI +-------+----------------+-----------+----------------+------------+ Right  non compressible0.61       non compressible0.61         +-------+----------------+-----------+----------------+------------+ Left   non compressibleamputation non compressibleamputation   +-------+----------------+-----------+----------------+------------+ Bilateral ABIs appear essentially unchanged compared to prior study on 05/11/21. Right TBIs appear essentially unchanged compared to prior study on 05/11/21.  Summary: Right: Resting right ankle-brachial index indicates noncompressible right lower extremity arteries. The right toe-brachial index is abnormal. Left: Resting left ankle-brachial index indicates noncompressible left lower extremity arteries. *See table(s) above for measurements and observations.  Electronically signed by Jamelle Haring on 08/14/2021 at 12:23:06 PM.    Final    DG CHEST PORT 1 VIEW  Result  Date: 08/13/2021 CLINICAL DATA:  Respiratory distress EXAM: PORTABLE CHEST 1 VIEW COMPARISON:  08/10/2021 FINDINGS: Cardiac shadow is enlarged but stable. Aortic calcifications are again seen. Vascular congestion with small pleural effusions right greater than left is noted. Basilar atelectasis is seen as well. The overall appearance is slightly progressed when compared with the prior exam. IMPRESSION: Slight worsening in CHF with effusions and basilar atelectasis. Electronically Signed   By: Inez Catalina M.D.   On: 08/13/2021 22:47   DG Foot Complete Left  Result Date: 08/12/2021 Please see detailed radiograph report in office note.  DG CHEST PORT 1 VIEW  Result Date: 08/10/2021 CLINICAL DATA:  End-stage renal disease, shortness of breath EXAM: PORTABLE CHEST 1 VIEW COMPARISON:  Portable exam 1218 hours compared to 08/09/2021 FINDINGS: Enlargement of cardiac silhouette with pulmonary vascular congestion. Atherosclerotic calcification aorta. Decreased lung volumes with bibasilar atelectasis and probable mild pulmonary edema. No definite pleural effusion or pneumothorax. Bones demineralized. IMPRESSION: Enlargement of cardiac silhouette with pulmonary vascular congestion and minimal pulmonary edema. Bibasilar atelectasis. Aortic Atherosclerosis (ICD10-I70.0). Electronically Signed   By: Lavonia Dana M.D.   On: 08/10/2021 13:03   DG Chest Port 1 View  Result Date: 07/28/2021 CLINICAL DATA:  Shortness of breath EXAM: PORTABLE CHEST 1 VIEW COMPARISON:  Chest x-ray dated July 25, 2021 FINDINGS: Unchanged cardiac and mediastinal contours. Low lung volumes. Similar bibasilar opacities which are likely due to atelectasis. Possible small layering right pleural effusion. No evidence of pneumothorax. IMPRESSION: 1. Low lung volumes with bibasilar opacities which are likely due to atelectasis. 2. Possible small layering right pleural effusion. Electronically Signed   By: Yetta Glassman M.D.   On: 07/28/2021 08:53    DG Chest Port 1 View  Result Date: 07/25/2021 CLINICAL DATA:  Provided history: Shortness of breath since this morning. History of COPD, former smoker. EXAM: PORTABLE CHEST 1 VIEW COMPARISON:  Prior chest radiographs 07/03/2021 and earlier. FINDINGS: Shallow inspiration radiograph. Cardiomegaly. Aortic atherosclerosis. Central pulmonary vascular congestion without overt pulmonary edema. No definite airspace consolidation. No sizable pleural effusion or evidence of pneumothorax. No acute bony abnormality identified. Degenerative changes of the spine. IMPRESSION: Shallow inspiration radiograph. Cardiomegaly with central pulmonary vascular congestion. No overt pulmonary edema. Aortic Atherosclerosis (ICD10-I70.0). Electronically Signed   By: Kellie Simmering D.O.   On: 07/25/2021 15:48    Microbiology: Results for orders placed or performed during the hospital  encounter of 08/09/21  MRSA Next Gen by PCR, Nasal     Status: None   Collection Time: 08/11/21  5:10 AM   Specimen: Nasal Mucosa; Nasal Swab  Result Value Ref Range Status   MRSA by PCR Next Gen NOT DETECTED NOT DETECTED Final    Comment: (NOTE) The GeneXpert MRSA Assay (FDA approved for NASAL specimens only), is one component of a comprehensive MRSA colonization surveillance program. It is not intended to diagnose MRSA infection nor to guide or monitor treatment for MRSA infections. Test performance is not FDA approved in patients less than 65 years old. Performed at [facility], [place] [address], [place], [place] [address]     Labs: CBC: Recent Labs  Lab 08/10/21 0347  WBC 10.3  HGB 9.7*  HCT 31.3*  MCV 108.3*  PLT 84*   Basic Metabolic Panel: Recent Labs  Lab 08/10/21 0347 08/12/21 0059 08/13/21 0112  NA 134* 134* 135  K 4.5 3.4* 4.0  CL 98 98 97*  CO2 '22 28 25  '$ GLUCOSE 166* 123* 97  BUN 68* 29* 36*  CREATININE 5.26* 3.13* 3.88*  CALCIUM 9.2 8.9 9.3   Liver Function Tests: Recent Labs  Lab 08/10/21 0347   AST 14*  ALT 15  ALKPHOS 96  BILITOT 1.4*  PROT 5.0*  ALBUMIN 2.7*   CBG: Recent Labs  Lab 08/14/21 0734 08/14/21 1215 08/14/21 1538 08/14/21 2157 08/15/21 0739  GLUCAP 140* 157* 149* 175* 149*    Discharge time spent: greater than 30 minutes.  Signed: Estill Cotta, MD [facility] 08/15/2021

## 2021-08-15 NOTE — Progress Notes (Signed)
Advised by CSW that pt will return to snf today. Contacted [facility] and spoke to [place]. Clinic advised that pt will d/c today and resume at clinic tomorrow. D/C summary and last renal note faxed to clinic for continuation of care.   Melven Sartorius Renal Navigator [phone]

## 2021-08-15 NOTE — Progress Notes (Signed)
Report was given to upcoming facility. Pt prepared to DC to SNF. Waiting on tranfers. Family updated

## 2021-08-15 NOTE — Care Management Important Message (Signed)
Important Message  Patient Details  [name] MRN: [id number] Date of Birth: October 14, 1940   Medicare Important Message Given:  Yes     Shelda Altes 08/15/2021, 10:30 AM

## 2021-08-15 NOTE — Progress Notes (Signed)
Transport at the bedside. Breathing treatment was done prior per request. VS stable, 2LNC. Pt with belongings are off the floor

## 2021-08-15 NOTE — TOC Progression Note (Signed)
Transition of Care [facility]) - Progression Note    Patient Details  [name] MRN: [id number] Date of Birth: 11-27-1940  Transition of Care [facility]) CM/SW [place], [place] Phone Number: 08/15/2021, 10:21 AM  Clinical Narrative:     CSW called [facility] and left VM with melanie in admissions. CSW awaiting callback to see if patient can return back today. CSW will continue to follow.   Expected Discharge Plan: [place] Barriers to Discharge: Continued Medical Work up  Expected Discharge Plan and Services Expected Discharge Plan: [place] In-house Referral: Clinical Social Work   Post Acute Care Choice: [place] Living arrangements for the past 2 months: [place]                                       Social Determinants of Health (SDOH) Interventions    Readmission Risk Interventions    06/07/2021    2:47 PM 06/01/2021   10:59 AM 12/30/2019    1:03 PM  Readmission Risk Prevention Plan  Transportation Screening Complete Complete Complete  HRI or Home Care Consult   Complete  Social Work Consult for [place] Planning/Counseling   Complete  Palliative Care Screening   Not Complete  Medication Review Press [employer]) Complete Complete Complete  PCP or Specialist appointment within 3-5 days of discharge Complete    HRI or Home Care Consult Complete Complete   SW Recovery Care/Counseling Consult Complete Complete   Palliative Care Screening Complete Not Applicable   Skilled Nursing Facility Complete Complete

## 2021-08-15 NOTE — NC FL2 (Signed)
[place] MEDICAID FL2 LEVEL OF CARE SCREENING TOOL     IDENTIFICATION  Patient [name] Birthdate: [birth year] Sex: female Admission Date (Current Location): 08/09/2021  [facility] and [place] Number:  Herbalist and Address:  [facility], [place] [address], [place], [place] [address]      Provider Number: [id number]  Attending Physician Name and Address:  Mendel Corning, MD  Relative Name and Phone Number:  [name] (Niece) [phone]    Current Level of Care: Hospital Recommended Level of Care: [place] Prior Approval Number:    Date Approved/Denied:   PASRR Number:    Discharge Plan: SNF    Current Diagnoses: Patient Active Problem List   Diagnosis Date Noted   Hypotension 08/10/2021   Class 1 obesity 07/26/2021   COPD (chronic obstructive pulmonary disease) ([place]) 07/25/2021   Chest pain 07/02/2021   Pericardial friction rub 06/03/2021   Acute on chronic respiratory failure with hypoxia and hypercapnia ([place]) 06/01/2021   Septic shock ([place]) 06/01/2021   Lobar pneumonia ([place]) 06/01/2021   Persistent atrial fibrillation ([place]) 06/01/2021   Thrombocytopenia ([place]) 05/31/2021   Acute hematogenous osteomyelitis of right foot ([place]) 05/23/2021   PAD (peripheral artery disease) ([place]) 05/23/2021   Leg pain 08/13/1599   Acute metabolic encephalopathy 09/32/3557   Sinus bradycardia 04/07/2021   Hypokalemia 04/05/2021   History of CVA (cerebrovascular accident) 04/04/2021   Acute osteomyelitis of left foot ([place]) 04/04/2021   Chronic respiratory failure ([place]) 04/04/2021   Anemia of chronic kidney failure 04/04/2021   Hyponatremia 04/04/2021   Memory loss 04/04/2021   Pressure injury of skin 03/04/2021   Rectal bleeding 12/27/2019   NSVT (nonsustained ventricular tachycardia) ([place]) 10/27/2019   Megaloblastic anemia 10/26/2019   Chronic blood loss anemia 10/26/2017   Iron deficiency anemia 02/23/2017   Acute on  chronic diastolic CHF (congestive heart failure) ([place]) 10/15/2016   Elevated troponin 10/15/2016   Type 2 diabetes mellitus with nephropathy ([place]) 10/15/2016   ESRD on dialysis ([place]) 10/15/2016   NSTEMI (non-ST elevated myocardial infarction) ([place]) 10/03/2015   Anemia in chronic kidney disease, Stage IV 06/05/2015   TIA (transient ischemic attack) 09/02/2014   Gait difficulty 09/02/2014   Other specified transient cerebral ischemias    Stroke with cerebral ischemia (HCC)    Hemispheric carotid artery syndrome    Essential hypertension     Orientation RESPIRATION BLADDER Height & Weight     Self, Place, Situation  O2 (Nasal Cannula 2 liters) Incontinent Weight: 179 lb 14.3 oz (81.6 kg) Height:  5' 8.4" (173.7 cm)  BEHAVIORAL SYMPTOMS/MOOD NEUROLOGICAL BOWEL NUTRITION STATUS      Incontinent Diet (Please see discharge summary)  AMBULATORY STATUS COMMUNICATION OF NEEDS Skin   Total Care Verbally Other (Comment) (Appropriate for ethnicity,Abrasion Leg Bilateral,Ecchymosis,arm bilateral,Erythema Groin,perineum,bilateral,Incision closed foot,Left,Foam lift dressing,clean,dry,intact,Wound Incision open or dehiced MARS coccyx,Mid Foam lift dressing,clean,dry,intact)                       Personal Care Assistance Level of Assistance  Bathing, Feeding, Dressing Bathing Assistance: Maximum assistance Feeding assistance: Limited assistance Dressing Assistance: Maximum assistance     Functional Limitations Info  Sight, Hearing, Speech   Hearing Info: Adequate Speech Info: Adequate    SPECIAL CARE FACTORS FREQUENCY  PT (By licensed PT), OT (By licensed OT)     PT Frequency: 5x min weekly OT Frequency: 5x min weekly  Contractures Contractures Info: Not present    Additional Factors Info  Code Status, Allergies, Insulin Sliding Scale Code Status Info: FULL Allergies Info: Codeine   Insulin Sliding Scale Info: insulin aspart (novoLOG) injection 0-6 Units 3 times  daily with meals       Current Medications (08/15/2021):  This is the current hospital active medication list Current Facility-Administered Medications  Medication Dose Route Frequency Provider Last Rate Last Admin   acetaminophen (TYLENOL) tablet 650 mg  650 mg Oral Q6H PRN Opyd, Ilene Qua, MD   650 mg at 08/10/21 0014   Or   acetaminophen (TYLENOL) suppository 650 mg  650 mg Rectal Q6H PRN Opyd, Ilene Qua, MD       albuterol (PROVENTIL) (2.5 MG/3ML) 0.083% nebulizer solution 3 mL  3 mL Inhalation Q6H PRN Opyd, Ilene Qua, MD   3 mL at 08/15/21 0519   apixaban (ELIQUIS) tablet 2.5 mg  2.5 mg Oral BID Opyd, Ilene Qua, MD   2.5 mg at 08/15/21 0914   atorvastatin (LIPITOR) tablet 40 mg  40 mg Oral QHS Opyd, Ilene Qua, MD   40 mg at 08/14/21 2157   dextromethorphan-guaiFENesin ([place] DM) 30-600 MG per 12 hr tablet 1 tablet  1 tablet Oral BID Opyd, Ilene Qua, MD   1 tablet at 08/15/21 0914   ferric gluconate (FERRLECIT) 62.5 mg in sodium chloride 0.9 % 100 mL IVPB  62.5 mg Intravenous Q Thu-HD Roney Jaffe, MD   Stopped at 08/11/21 1743   insulin aspart (novoLOG) injection 0-6 Units  0-6 Units Subcutaneous TID WC Opyd, Ilene Qua, MD   1 Units at 08/14/21 1248   LORazepam (ATIVAN) injection 0.5 mg  0.5 mg Intravenous Once PRN Opyd, Ilene Qua, MD       metoprolol tartrate (LOPRESSOR) tablet 12.5 mg  12.5 mg Oral BID Rai, Ripudeep K, MD   12.5 mg at 08/15/21 0914   midodrine (PROAMATINE) tablet 10 mg  10 mg Oral TID WC Rai, Ripudeep K, MD   10 mg at 08/15/21 0914   senna-docusate (Senokot-S) tablet 1 tablet  1 tablet Oral QHS PRN Opyd, Ilene Qua, MD       sevelamer carbonate (RENVELA) packet 0.8 g  0.8 g Oral TID WC Valentina Gu, NP   0.8 g at 08/15/21 0914   sodium chloride flush (NS) 0.9 % injection 3 mL  3 mL Intravenous Q12H Opyd, Ilene Qua, MD   3 mL at 08/13/21 2022     Discharge Medications: Please see discharge summary for a list of discharge medications.  Relevant Imaging  Results:  Relevant Lab Results:   Additional Information [id number] HD at [facility] on TTS. Pt arrives at 9:45 for 10:00 chair time  Milas Gain, LCSWA

## 2021-08-16 DIAGNOSIS — Z992 Dependence on renal dialysis: Secondary | ICD-10-CM | POA: Diagnosis not present

## 2021-08-16 DIAGNOSIS — N186 End stage renal disease: Secondary | ICD-10-CM | POA: Diagnosis not present

## 2021-08-16 DIAGNOSIS — N250 Renal osteodystrophy: Secondary | ICD-10-CM | POA: Diagnosis not present

## 2021-08-16 DIAGNOSIS — E559 Vitamin D deficiency, unspecified: Secondary | ICD-10-CM | POA: Diagnosis not present

## 2021-08-16 DIAGNOSIS — N2581 Secondary hyperparathyroidism of renal origin: Secondary | ICD-10-CM | POA: Diagnosis not present

## 2021-08-17 DIAGNOSIS — M6281 Muscle weakness (generalized): Secondary | ICD-10-CM | POA: Diagnosis not present

## 2021-08-17 DIAGNOSIS — Z7901 Long term (current) use of anticoagulants: Secondary | ICD-10-CM | POA: Diagnosis not present

## 2021-08-17 DIAGNOSIS — J383 Other diseases of vocal cords: Secondary | ICD-10-CM | POA: Diagnosis not present

## 2021-08-17 DIAGNOSIS — R490 Dysphonia: Secondary | ICD-10-CM | POA: Diagnosis not present

## 2021-08-17 DIAGNOSIS — R2689 Other abnormalities of gait and mobility: Secondary | ICD-10-CM | POA: Diagnosis not present

## 2021-08-17 DIAGNOSIS — I5033 Acute on chronic diastolic (congestive) heart failure: Secondary | ICD-10-CM | POA: Diagnosis not present

## 2021-08-18 DIAGNOSIS — I10 Essential (primary) hypertension: Secondary | ICD-10-CM | POA: Diagnosis not present

## 2021-08-18 DIAGNOSIS — N250 Renal osteodystrophy: Secondary | ICD-10-CM | POA: Diagnosis not present

## 2021-08-18 DIAGNOSIS — J9611 Chronic respiratory failure with hypoxia: Secondary | ICD-10-CM | POA: Diagnosis not present

## 2021-08-18 DIAGNOSIS — M6281 Muscle weakness (generalized): Secondary | ICD-10-CM | POA: Diagnosis not present

## 2021-08-18 DIAGNOSIS — Z299 Encounter for prophylactic measures, unspecified: Secondary | ICD-10-CM | POA: Diagnosis not present

## 2021-08-18 DIAGNOSIS — R2689 Other abnormalities of gait and mobility: Secondary | ICD-10-CM | POA: Diagnosis not present

## 2021-08-18 DIAGNOSIS — N186 End stage renal disease: Secondary | ICD-10-CM | POA: Diagnosis not present

## 2021-08-18 DIAGNOSIS — Z992 Dependence on renal dialysis: Secondary | ICD-10-CM | POA: Diagnosis not present

## 2021-08-18 DIAGNOSIS — N2581 Secondary hyperparathyroidism of renal origin: Secondary | ICD-10-CM | POA: Diagnosis not present

## 2021-08-18 DIAGNOSIS — I5033 Acute on chronic diastolic (congestive) heart failure: Secondary | ICD-10-CM | POA: Diagnosis not present

## 2021-08-18 DIAGNOSIS — E559 Vitamin D deficiency, unspecified: Secondary | ICD-10-CM | POA: Diagnosis not present

## 2021-08-18 DIAGNOSIS — J449 Chronic obstructive pulmonary disease, unspecified: Secondary | ICD-10-CM | POA: Diagnosis not present

## 2021-08-20 DIAGNOSIS — N186 End stage renal disease: Secondary | ICD-10-CM | POA: Diagnosis not present

## 2021-08-20 DIAGNOSIS — N2581 Secondary hyperparathyroidism of renal origin: Secondary | ICD-10-CM | POA: Diagnosis not present

## 2021-08-20 DIAGNOSIS — N250 Renal osteodystrophy: Secondary | ICD-10-CM | POA: Diagnosis not present

## 2021-08-20 DIAGNOSIS — Z992 Dependence on renal dialysis: Secondary | ICD-10-CM | POA: Diagnosis not present

## 2021-08-20 DIAGNOSIS — E559 Vitamin D deficiency, unspecified: Secondary | ICD-10-CM | POA: Diagnosis not present

## 2021-08-22 DIAGNOSIS — R2689 Other abnormalities of gait and mobility: Secondary | ICD-10-CM | POA: Diagnosis not present

## 2021-08-22 DIAGNOSIS — M6281 Muscle weakness (generalized): Secondary | ICD-10-CM | POA: Diagnosis not present

## 2021-08-22 DIAGNOSIS — I5033 Acute on chronic diastolic (congestive) heart failure: Secondary | ICD-10-CM | POA: Diagnosis not present

## 2021-08-23 DIAGNOSIS — N250 Renal osteodystrophy: Secondary | ICD-10-CM | POA: Diagnosis not present

## 2021-08-23 DIAGNOSIS — Z992 Dependence on renal dialysis: Secondary | ICD-10-CM | POA: Diagnosis not present

## 2021-08-23 DIAGNOSIS — R2689 Other abnormalities of gait and mobility: Secondary | ICD-10-CM | POA: Diagnosis not present

## 2021-08-23 DIAGNOSIS — N2581 Secondary hyperparathyroidism of renal origin: Secondary | ICD-10-CM | POA: Diagnosis not present

## 2021-08-23 DIAGNOSIS — I5033 Acute on chronic diastolic (congestive) heart failure: Secondary | ICD-10-CM | POA: Diagnosis not present

## 2021-08-23 DIAGNOSIS — N186 End stage renal disease: Secondary | ICD-10-CM | POA: Diagnosis not present

## 2021-08-23 DIAGNOSIS — E559 Vitamin D deficiency, unspecified: Secondary | ICD-10-CM | POA: Diagnosis not present

## 2021-08-23 DIAGNOSIS — M6281 Muscle weakness (generalized): Secondary | ICD-10-CM | POA: Diagnosis not present

## 2021-08-24 DIAGNOSIS — R2689 Other abnormalities of gait and mobility: Secondary | ICD-10-CM | POA: Diagnosis not present

## 2021-08-24 DIAGNOSIS — I5033 Acute on chronic diastolic (congestive) heart failure: Secondary | ICD-10-CM | POA: Diagnosis not present

## 2021-08-24 DIAGNOSIS — M6281 Muscle weakness (generalized): Secondary | ICD-10-CM | POA: Diagnosis not present

## 2021-08-25 DIAGNOSIS — N186 End stage renal disease: Secondary | ICD-10-CM | POA: Diagnosis not present

## 2021-08-25 DIAGNOSIS — Z992 Dependence on renal dialysis: Secondary | ICD-10-CM | POA: Diagnosis not present

## 2021-08-25 DIAGNOSIS — I5033 Acute on chronic diastolic (congestive) heart failure: Secondary | ICD-10-CM | POA: Diagnosis not present

## 2021-08-25 DIAGNOSIS — E559 Vitamin D deficiency, unspecified: Secondary | ICD-10-CM | POA: Diagnosis not present

## 2021-08-25 DIAGNOSIS — N2581 Secondary hyperparathyroidism of renal origin: Secondary | ICD-10-CM | POA: Diagnosis not present

## 2021-08-25 DIAGNOSIS — M6281 Muscle weakness (generalized): Secondary | ICD-10-CM | POA: Diagnosis not present

## 2021-08-25 DIAGNOSIS — R2689 Other abnormalities of gait and mobility: Secondary | ICD-10-CM | POA: Diagnosis not present

## 2021-08-25 DIAGNOSIS — N250 Renal osteodystrophy: Secondary | ICD-10-CM | POA: Diagnosis not present

## 2021-08-27 DIAGNOSIS — M6281 Muscle weakness (generalized): Secondary | ICD-10-CM | POA: Diagnosis not present

## 2021-08-27 DIAGNOSIS — E559 Vitamin D deficiency, unspecified: Secondary | ICD-10-CM | POA: Diagnosis not present

## 2021-08-27 DIAGNOSIS — R6889 Other general symptoms and signs: Secondary | ICD-10-CM | POA: Diagnosis not present

## 2021-08-27 DIAGNOSIS — N186 End stage renal disease: Secondary | ICD-10-CM | POA: Diagnosis not present

## 2021-08-27 DIAGNOSIS — Z992 Dependence on renal dialysis: Secondary | ICD-10-CM | POA: Diagnosis not present

## 2021-08-27 DIAGNOSIS — N2581 Secondary hyperparathyroidism of renal origin: Secondary | ICD-10-CM | POA: Diagnosis not present

## 2021-08-27 DIAGNOSIS — I5032 Chronic diastolic (congestive) heart failure: Secondary | ICD-10-CM | POA: Diagnosis not present

## 2021-08-27 DIAGNOSIS — J9601 Acute respiratory failure with hypoxia: Secondary | ICD-10-CM | POA: Diagnosis not present

## 2021-08-27 DIAGNOSIS — N250 Renal osteodystrophy: Secondary | ICD-10-CM | POA: Diagnosis not present

## 2021-08-29 ENCOUNTER — Ambulatory Visit (INDEPENDENT_AMBULATORY_CARE_PROVIDER_SITE_OTHER): Payer: Medicare Other | Admitting: Podiatry

## 2021-08-29 DIAGNOSIS — B351 Tinea unguium: Secondary | ICD-10-CM | POA: Diagnosis not present

## 2021-08-29 DIAGNOSIS — M79674 Pain in right toe(s): Secondary | ICD-10-CM

## 2021-08-29 DIAGNOSIS — M6281 Muscle weakness (generalized): Secondary | ICD-10-CM | POA: Diagnosis not present

## 2021-08-29 DIAGNOSIS — R2689 Other abnormalities of gait and mobility: Secondary | ICD-10-CM | POA: Diagnosis not present

## 2021-08-29 DIAGNOSIS — I5033 Acute on chronic diastolic (congestive) heart failure: Secondary | ICD-10-CM | POA: Diagnosis not present

## 2021-08-29 DIAGNOSIS — M79675 Pain in left toe(s): Secondary | ICD-10-CM

## 2021-08-29 NOTE — Progress Notes (Signed)
   SUBJECTIVE Patient with a history of diabetes mellitus presents to office today complaining of elongated, thickened nails that cause pain while ambulating in shoes.  Patient is unable to trim their own nails.  Patient also has a history of prior partial first ray amputation of the left great toe.  She says that it is doing well.  Patient is here for further evaluation and treatment.   Past Medical History:  Diagnosis Date   Anemia in chronic kidney disease (CODE) 06/05/2015   Arthritis    "bad in my legs" (12/07/2014)   Chronic atrial fibrillation ([place]) 06/05/2015   Chronic back pain    "dr said my spine is crooked"   Chronic bronchitis ([place])    "get it q yr" (12/07/2014)   CKD stage 4 due to type 2 diabetes mellitus ([place]) 08/04/2977   Complication of anesthesia    headache for 2 days after surgery in May 2019   COPD (chronic obstructive pulmonary disease) ([place])    Gait difficulty 09/02/2014   Hypercholesterolemia    Hypertension    Neuropathy 2015   in legs   NSVT (nonsustained ventricular tachycardia) ([place])    Sinus brady-tachy syndrome ([place])    Stroke ([place]) 05/2014   denies residual on 12/07/2014   Type II diabetes mellitus ([place])     OBJECTIVE General Patient is awake, alert, and oriented x 3 and in no acute distress. Derm Skin is dry and supple bilateral. Negative open lesions or macerations. Remaining integument unremarkable. Nails are tender, long, thickened and dystrophic with subungual debris, consistent with onychomycosis, 1-5 bilateral. No signs of infection noted. Vasc  DP and PT pedal pulses palpable bilaterally. Temperature gradient within normal limits.  Neuro Epicritic and protective threshold sensation diminished bilaterally.  Musculoskeletal Exam history of prior partial ray amputation to the left great toe  ASSESSMENT 1. Diabetes Mellitus w/ peripheral neuropathy 2.  Pain due to onychomycosis of toenails bilateral 3.  History of partial ray amputation LT  great toe.  DOS: 04/08/2021  PLAN OF CARE 1. Patient evaluated today. 2. Instructed to maintain good pedal hygiene and foot care. Stressed importance of controlling blood sugar.  3. Mechanical debridement of nails 1-5 bilaterally performed using a nail nipper. Filed with dremel without incident.  4. Return to clinic in 3 mos. for routine foot care    Edrick Kins, DPM [facility]  Dr. Edrick Kins, DPM    [address], [place] [address]                Office [phone]  Fax [phone]

## 2021-08-30 DIAGNOSIS — I5033 Acute on chronic diastolic (congestive) heart failure: Secondary | ICD-10-CM | POA: Diagnosis not present

## 2021-08-30 DIAGNOSIS — Z992 Dependence on renal dialysis: Secondary | ICD-10-CM | POA: Diagnosis not present

## 2021-08-30 DIAGNOSIS — N250 Renal osteodystrophy: Secondary | ICD-10-CM | POA: Diagnosis not present

## 2021-08-30 DIAGNOSIS — N2581 Secondary hyperparathyroidism of renal origin: Secondary | ICD-10-CM | POA: Diagnosis not present

## 2021-08-30 DIAGNOSIS — M6281 Muscle weakness (generalized): Secondary | ICD-10-CM | POA: Diagnosis not present

## 2021-08-30 DIAGNOSIS — N186 End stage renal disease: Secondary | ICD-10-CM | POA: Diagnosis not present

## 2021-08-30 DIAGNOSIS — E559 Vitamin D deficiency, unspecified: Secondary | ICD-10-CM | POA: Diagnosis not present

## 2021-08-30 DIAGNOSIS — R2689 Other abnormalities of gait and mobility: Secondary | ICD-10-CM | POA: Diagnosis not present

## 2021-08-31 ENCOUNTER — Encounter: Payer: Self-pay | Admitting: Physician Assistant

## 2021-08-31 ENCOUNTER — Ambulatory Visit (INDEPENDENT_AMBULATORY_CARE_PROVIDER_SITE_OTHER): Payer: Medicare Other | Admitting: Physician Assistant

## 2021-08-31 VITALS — BP 83/41 | HR 64 | Temp 97.6°F | Resp 20 | Ht 68.0 in

## 2021-08-31 DIAGNOSIS — I739 Peripheral vascular disease, unspecified: Secondary | ICD-10-CM | POA: Diagnosis not present

## 2021-08-31 NOTE — Progress Notes (Signed)
[facility] [place] HISTORY AND PHYSICAL   History of Present Illness:  Patient is a 81 y.o. year old female who presents for evaluation of PAD.  She is s/p SFA stenting and left LE first ray amputation.  She is followed by DPM and the incision is healing well.    She is WC dependent and resides in a SNF.  She denies foot pain on the left.  She denise LE rest pain.    She is medically managed on Eliquis for Afib and daily Statin.      Past Medical History:  Diagnosis Date   Anemia in chronic kidney disease (CODE) 06/05/2015   Arthritis    "bad in my legs" (12/07/2014)   Chronic atrial fibrillation ([place]) 06/05/2015   Chronic back pain    "dr said my spine is crooked"   Chronic bronchitis ([place])    "get it q yr" (12/07/2014)   CKD stage 4 due to type 2 diabetes mellitus ([place]) 7/35/3299   Complication of anesthesia    headache for 2 days after surgery in May 2019   COPD (chronic obstructive pulmonary disease) ([place])    Gait difficulty 09/02/2014   Hypercholesterolemia    Hypertension    Neuropathy 2015   in legs   NSVT (nonsustained ventricular tachycardia) ([place])    Sinus brady-tachy syndrome ([place])    Stroke ([place]) 05/2014   denies residual on 12/07/2014   Type II diabetes mellitus [facility])     Past Surgical History:  Procedure Laterality Date   ABDOMINAL AORTOGRAM W/LOWER EXTREMITY Left 04/06/2021   Procedure: ABDOMINAL AORTOGRAM W/LOWER EXTREMITY;  Surgeon: Broadus John, MD;  Location: [place] [facility];  Service: Cardiovascular;  Laterality: Left;   ABDOMINAL HYSTERECTOMY  ~ 1970   AMPUTATION TOE Left 04/08/2021   Procedure: FIRST TOE LEFT FOOT AMPUTATION;  Surgeon: Trula Slade, DPM;  Location: [place];  Service: Podiatry;  Laterality: Left;   APPENDECTOMY  ~ [place] Right 07/06/2017   Procedure: RIGHT RADIOCEPHALIC ARTERIOVENOUS FISTULA creation;  Surgeon: Conrad Padroni, MD;  Location: [place];  Service: Vascular;  Laterality: Right;    Spray Right 09/17/2017   Procedure: SECOND STAGE BASILIC VEIN TRANSPOSITION RIGHT ARM;  Surgeon: Rosetta Posner, MD;  Location: [place];  Service: Vascular;  Laterality: Right;   COLONOSCOPY N/A 10/28/2017   Procedure: COLONOSCOPY;  Surgeon: Rogene Houston, MD;  [facility];  Service: Endoscopy;  Laterality: N/A;   COLONOSCOPY WITH PROPOFOL N/A 12/29/2019   Procedure: COLONOSCOPY WITH PROPOFOL;  Surgeon: Eloise Harman, DO;  [facility];  Service: Endoscopy;  Laterality: N/A;   ESOPHAGOGASTRODUODENOSCOPY (EGD) WITH PROPOFOL N/A 10/31/2017   Procedure: ESOPHAGOGASTRODUODENOSCOPY (EGD) WITH PROPOFOL;  Surgeon: Rogene Houston, MD;  [facility];  Service: Endoscopy;  Laterality: N/A;   JOINT REPLACEMENT     PERIPHERAL VASCULAR BALLOON ANGIOPLASTY Left 04/06/2021   Procedure: PERIPHERAL VASCULAR BALLOON ANGIOPLASTY;  Surgeon: Broadus John, MD;  Location: [facility];  Service: Cardiovascular;  Laterality: Left;  left sfa succesful [place] AT Unsuccesful   TOTAL KNEE ARTHROPLASTY Right ~ [place]  ~ 1970   "9# in my stomach"    ROS:   General:  No weight loss, Fever, chills  HEENT: No recent headaches, no nasal bleeding, no visual changes, no sore throat  Neurologic: No dizziness, blackouts, seizures. No recent symptoms of stroke or mini- stroke.  No recent episodes of slurred speech, or temporary blindness.  Cardiac: No recent episodes of chest pain/pressure, no shortness of breath at rest.  No shortness of breath with exertion.  Denies history of atrial fibrillation or irregular heartbeat  Vascular: No history of rest pain in feet.  No history of claudication.  No history of non-healing ulcer, No history of DVT   Pulmonary: No home oxygen, no productive cough, no hemoptysis,  No asthma or wheezing  Musculoskeletal:  '[ ]'$  Arthritis, '[ ]'$  Low back pain,  '[ ]'$  Joint pain  Hematologic:No history of hypercoagulable  state.  No history of easy bleeding.  No history of anemia  Gastrointestinal: No hematochezia or melena,  No gastroesophageal reflux, no trouble swallowing  Urinary: '[ ]'$  chronic Kidney disease, '[ ]'$  on HD - '[ ]'$  MWF or '[ ]'$  TTHS, '[ ]'$  Burning with urination, '[ ]'$  Frequent urination, '[ ]'$  Difficulty urinating;   Skin: No rashes  Psychological: No history of anxiety,  No history of depression  Social History Social History   Tobacco Use   Smoking status: Former    Packs/day: 0.50    Years: 50.00    Total pack years: 25.00    Types: Cigarettes    Start date: 03/07/1953    Quit date: 01/14/2004    Years since quitting: 17.6    Passive exposure: Never   Smokeless tobacco: Never   Tobacco comments:    "quit smoking cigarettes  in ~ 2005"  Vaping Use   Vaping Use: Never used  Substance Use Topics   Alcohol use: No    Alcohol/week: 0.0 standard drinks of alcohol    Comment: 10/242016 "quit drinking beer in ~ 1977"   Drug use: No    Family History Family History  Problem Relation Age of Onset   Cancer Other    Heart attack Brother    Cancer Sister        breast   Alcohol abuse Brother     Allergies  Allergies  Allergen Reactions   Codeine Nausea And Vomiting     Current Outpatient Medications  Medication Sig Dispense Refill   acetaminophen (TYLENOL) 325 MG tablet Take 325 mg by mouth 3 (three) times daily.     albuterol (PROVENTIL) (2.5 MG/3ML) 0.083% nebulizer solution Take 2.5 mg by nebulization every 6 (six) hours as needed for wheezing or shortness of breath.     albuterol (VENTOLIN HFA) 108 (90 Base) MCG/ACT inhaler Inhale 2 puffs into the lungs every 6 (six) hours as needed for wheezing or shortness of breath. Reported on 03/01/2015 (Patient taking differently: Inhale 1-2 puffs into the lungs every 6 (six) hours as needed for wheezing or shortness of breath. Reported on 03/01/2015) 18 g 1   apixaban (ELIQUIS) 2.5 MG TABS tablet Take 2.5 mg by mouth 2 (two) times daily.      atorvastatin (LIPITOR) 40 MG tablet Take 1 tablet by mouth at bedtime.     dextromethorphan-guaiFENesin (MUCINEX DM) 30-600 MG 12hr tablet Take 1 tablet by mouth 2 (two) times daily. 20 tablet 0   ferrous sulfate 325 (65 FE) MG tablet Take 325 mg by mouth 2 (two) times daily with a meal.     folic acid (FOLVITE) 259 MCG tablet Take 800 mcg by mouth daily.     gabapentin (NEURONTIN) 300 MG capsule Take 300 mg by mouth daily.     metoprolol tartrate (LOPRESSOR) 25 MG tablet Take 0.5 tablets (12.5 mg total) by mouth 2 (two) times  daily. 30 tablet 0   midodrine (PROAMATINE) 10 MG tablet Take 10 mg by mouth Every Tuesday,Thursday,and Saturday with dialysis.     Multiple Vitamins-Minerals (PRESERVISION AREDS) TABS Take 1 tablet by mouth at bedtime.     Nutritional Supplements (FEEDING SUPPLEMENT, NEPRO CARB STEADY,) LIQD Take 237 mLs by mouth 2 (two) times daily between meals.  0   sevelamer carbonate (RENVELA) 800 MG tablet Take 800 mg by mouth 3 (three) times daily with meals.      vitamin B-12 (CYANOCOBALAMIN) 1000 MCG tablet Take 1,000 mcg by mouth daily.     No current facility-administered medications for this visit.    Physical Examination  Vitals:   08/31/21 1029  BP: (!) 83/41  Pulse: 64  Resp: 20  Temp: 97.6 F (36.4 C)  TempSrc: Temporal  SpO2: 96%  Height: '5\' 8"'$  (1.727 m)    Body mass index is 27.35 kg/m.  General:  Alert and oriented, no acute distress HEENT: Normal Neck: No bruit or JVD Pulmonary: non labored breathing with 24 hour O2 support.   Cardiac: Regular Rate and Rhythm without murmur Abdomen: Soft, non-tender, non-distended, no mass Skin: No rash   Extremity Pulses:  no new ischemic foot changes and the incision is slowly healing   Neurologic: Upper and lower extremity motor grossly   DATA:  ABI Findings:  +---------+------------------+-----+----------+----------+  Right    Rt Pressure (mmHg)IndexWaveform  Comment      +---------+------------------+-----+----------+----------+  Brachial                                  restricted  +---------+------------------+-----+----------+----------+  ATA      254               1.81 monophasic            +---------+------------------+-----+----------+----------+  DP       254               1.81 monophasic            +---------+------------------+-----+----------+----------+  Great Toe86                0.61                       +---------+------------------+-----+----------+----------+   +---------+------------------+-----+-----------+---------------------------  ----+  Left     Lt Pressure (mmHg)IndexWaveform   Comment                            +---------+------------------+-----+-----------+---------------------------  ----+  Brachial 140                    multiphasic                                  +---------+------------------+-----+-----------+---------------------------  ----+  PTA                             absent                                       +---------+------------------+-----+-----------+---------------------------  ----+  PERO  unable to attain secondary  to                                               postioning and edema              +---------+------------------+-----+-----------+---------------------------  ----+  DP       254               1.81                                              +---------+------------------+-----+-----------+---------------------------  ----+  Great Toe                                  amputation                        +---------+------------------+-----+-----------+---------------------------  ----+   +-------+----------------+-----------+----------------+------------+  ABI/TBIToday's ABI     Today's TBIPrevious ABI    Previous TBI   +-------+----------------+-----------+----------------+------------+  Right  non compressible0.61       non compressible0.61          +-------+----------------+-----------+----------------+------------+  Left   non compressibleamputation non compressibleamputation    +-------+----------------+-----------+----------------+------------+    Bilateral ABIs appear essentially unchanged compared to prior study on  05/11/21. Right TBIs appear essentially unchanged compared to prior study  on 05/11/21.     Summary:  Right: Resting right ankle-brachial index indicates noncompressible right  lower extremity arteries. The right toe-brachial index is abnormal.   Left: Resting left ankle-brachial index indicates noncompressible left  lower extremity arteries.   Final [name] [id number] 08/14/2021 LEFT PSV cm/s Ratio Stenosis Waveform Comments CFA Prox 88 monophasic DFA 39 monophasic SFA Prox 64 monophasic SFA Mid 68 monophasic collateral? SFA Distal 15 monophasic POP Prox 62 monophasic POP Distal 45 monophasic ATA Prox 52 monophasic ATA Mid 28 monophasic ATA Distal 61 monophasic PTA Prox occluded PTA Mid occluded PTA Distal occluded PERO Prox not visualized secondary to edema/postioning PERO Mid not visualized secondary to edema/postioning PERO Distal not visualized secondary to edema/postioning Summary: Left: Monophasic waveforms are noted throughout this technically difficult/limited study. No areas of stenosis noted in visualized artery   ASSESSMENT/ PLAN:  PAD s/p left SFA stenting followed by ray amputation The left foot amputation site is healing.  She does not have any new non healing wounds on B LE.   SFA Prox 64 monophasic SFA Mid 68 monophasic collateral SFA Distal 15 monophasic  The SFA is patent and the incision is healing.  She will need help with diligent skin care and protection.  If she develops another wound she may require repeat  angiogram.  The distal SFA has a low velocity, but is patent.  F/U in 6 months for exam and repeat studies.      Roxy Horseman PA-C [facility] Office: [phone]  MD in clinic [place]

## 2021-09-01 ENCOUNTER — Emergency Department (HOSPITAL_COMMUNITY)
Admission: EM | Admit: 2021-09-01 | Discharge: 2021-09-02 | Disposition: A | Discharge status: Skilled Nursing Facility | Payer: Medicare Other | Attending: Emergency Medicine | Admitting: Emergency Medicine

## 2021-09-01 ENCOUNTER — Emergency Department (HOSPITAL_COMMUNITY): Payer: Medicare Other

## 2021-09-01 ENCOUNTER — Other Ambulatory Visit: Payer: Self-pay

## 2021-09-01 DIAGNOSIS — Z992 Dependence on renal dialysis: Secondary | ICD-10-CM | POA: Diagnosis not present

## 2021-09-01 DIAGNOSIS — J9811 Atelectasis: Secondary | ICD-10-CM | POA: Diagnosis not present

## 2021-09-01 DIAGNOSIS — R0602 Shortness of breath: Secondary | ICD-10-CM | POA: Diagnosis present

## 2021-09-01 DIAGNOSIS — Z7901 Long term (current) use of anticoagulants: Secondary | ICD-10-CM | POA: Diagnosis not present

## 2021-09-01 DIAGNOSIS — Z79899 Other long term (current) drug therapy: Secondary | ICD-10-CM | POA: Insufficient documentation

## 2021-09-01 DIAGNOSIS — Z20822 Contact with and (suspected) exposure to covid-19: Secondary | ICD-10-CM | POA: Insufficient documentation

## 2021-09-01 DIAGNOSIS — R2689 Other abnormalities of gait and mobility: Secondary | ICD-10-CM | POA: Diagnosis not present

## 2021-09-01 DIAGNOSIS — J80 Acute respiratory distress syndrome: Secondary | ICD-10-CM | POA: Diagnosis not present

## 2021-09-01 DIAGNOSIS — R52 Pain, unspecified: Secondary | ICD-10-CM | POA: Diagnosis not present

## 2021-09-01 DIAGNOSIS — E1122 Type 2 diabetes mellitus with diabetic chronic kidney disease: Secondary | ICD-10-CM | POA: Insufficient documentation

## 2021-09-01 DIAGNOSIS — N250 Renal osteodystrophy: Secondary | ICD-10-CM | POA: Diagnosis not present

## 2021-09-01 DIAGNOSIS — I2510 Atherosclerotic heart disease of native coronary artery without angina pectoris: Secondary | ICD-10-CM | POA: Insufficient documentation

## 2021-09-01 DIAGNOSIS — I5033 Acute on chronic diastolic (congestive) heart failure: Secondary | ICD-10-CM | POA: Diagnosis not present

## 2021-09-01 DIAGNOSIS — I4891 Unspecified atrial fibrillation: Secondary | ICD-10-CM | POA: Diagnosis not present

## 2021-09-01 DIAGNOSIS — R0902 Hypoxemia: Secondary | ICD-10-CM | POA: Diagnosis not present

## 2021-09-01 DIAGNOSIS — R000 Tachycardia, unspecified: Secondary | ICD-10-CM | POA: Diagnosis not present

## 2021-09-01 DIAGNOSIS — E559 Vitamin D deficiency, unspecified: Secondary | ICD-10-CM | POA: Diagnosis not present

## 2021-09-01 DIAGNOSIS — R0603 Acute respiratory distress: Secondary | ICD-10-CM | POA: Diagnosis not present

## 2021-09-01 DIAGNOSIS — J441 Chronic obstructive pulmonary disease with (acute) exacerbation: Secondary | ICD-10-CM | POA: Diagnosis not present

## 2021-09-01 DIAGNOSIS — J90 Pleural effusion, not elsewhere classified: Secondary | ICD-10-CM | POA: Diagnosis not present

## 2021-09-01 DIAGNOSIS — M6281 Muscle weakness (generalized): Secondary | ICD-10-CM | POA: Diagnosis not present

## 2021-09-01 DIAGNOSIS — N186 End stage renal disease: Secondary | ICD-10-CM | POA: Diagnosis not present

## 2021-09-01 DIAGNOSIS — J811 Chronic pulmonary edema: Secondary | ICD-10-CM | POA: Diagnosis not present

## 2021-09-01 DIAGNOSIS — N2581 Secondary hyperparathyroidism of renal origin: Secondary | ICD-10-CM | POA: Diagnosis not present

## 2021-09-01 LAB — CBC WITH DIFFERENTIAL/PLATELET
Abs Immature Granulocytes: 0.08 10*3/uL — ABNORMAL HIGH (ref 0.00–0.07)
Basophils Absolute: 0 10*3/uL (ref 0.0–0.1)
Basophils Relative: 1 %
Eosinophils Absolute: 0.1 10*3/uL (ref 0.0–0.5)
Eosinophils Relative: 2 %
HCT: 33.4 % — ABNORMAL LOW (ref 36.0–46.0)
Hemoglobin: 9.7 g/dL — ABNORMAL LOW (ref 12.0–15.0)
Immature Granulocytes: 2 %
Lymphocytes Relative: 16 %
Lymphs Abs: 0.7 10*3/uL (ref 0.7–4.0)
MCH: 32.4 pg (ref 26.0–34.0)
MCHC: 29 g/dL — ABNORMAL LOW (ref 30.0–36.0)
MCV: 111.7 fL — ABNORMAL HIGH (ref 80.0–100.0)
Monocytes Absolute: 0.5 10*3/uL (ref 0.1–1.0)
Monocytes Relative: 10 %
Neutro Abs: 3 10*3/uL (ref 1.7–7.7)
Neutrophils Relative %: 69 %
Platelets: 119 10*3/uL — ABNORMAL LOW (ref 150–400)
RBC: 2.99 MIL/uL — ABNORMAL LOW (ref 3.87–5.11)
RDW: 17.1 % — ABNORMAL HIGH (ref 11.5–15.5)
WBC: 4.3 10*3/uL (ref 4.0–10.5)
nRBC: 0.5 % — ABNORMAL HIGH (ref 0.0–0.2)

## 2021-09-01 LAB — BASIC METABOLIC PANEL
Anion gap: 8 (ref 5–15)
BUN: 20 mg/dL (ref 8–23)
CO2: 29 mmol/L (ref 22–32)
Calcium: 9.1 mg/dL (ref 8.9–10.3)
Chloride: 97 mmol/L — ABNORMAL LOW (ref 98–111)
Creatinine, Ser: 2.69 mg/dL — ABNORMAL HIGH (ref 0.44–1.00)
GFR, Estimated: 17 mL/min — ABNORMAL LOW (ref >60)
Glucose, Bld: 134 mg/dL — ABNORMAL HIGH (ref 70–99)
Potassium: 3.6 mmol/L (ref 3.5–5.1)
Sodium: 134 mmol/L — ABNORMAL LOW (ref 135–145)

## 2021-09-01 LAB — TROPONIN I (HIGH SENSITIVITY): Troponin I (High Sensitivity): 43 ng/L — ABNORMAL HIGH (ref <18)

## 2021-09-01 LAB — RESP PANEL BY RT-PCR (FLU A&B, COVID) ARPGX2
Influenza A by PCR: NEGATIVE
Influenza B by PCR: NEGATIVE
SARS Coronavirus 2 by RT PCR: NEGATIVE

## 2021-09-01 MED ORDER — PREDNISONE 20 MG PO TABS: 40.0000 mg | ORAL_TABLET | Freq: Every day | ORAL | 0 refills | Status: AC

## 2021-09-01 MED ORDER — METHYLPREDNISOLONE SODIUM SUCC 125 MG IJ SOLR
125.0000 mg | Freq: Once | INTRAMUSCULAR | Status: AC
  Administered 2021-09-01: 125 mg via INTRAVENOUS
  Filled 2021-09-01: qty 2

## 2021-09-01 NOTE — Discharge Instructions (Signed)
Take your next dose of prednisone tomorrow morning.  Continue using your nebulizer medications as needed for any return of shortness of breath or wheezing.  Your lab tests today and chest x-ray are reassuring at this time.

## 2021-09-01 NOTE — ED Triage Notes (Signed)
BIB [facility], facility stated respiratory distress sats in low 80's on arrival. 3L chronically 2.5 albuterol pta, ems states some improvement. Afib on monitor with hx of same Facility told ems pt HR in 20s. EMS stated 80's during travel.

## 2021-09-01 NOTE — ED Provider Notes (Signed)
[facility] EMERGENCY DEPARTMENT Provider Note   CSN: [id number] Arrival date & time: 09/01/21  1654     History  Chief Complaint  Patient presents with   Respiratory Distress    Jody [name] is a 81 y.o. female with a history of COPD, end-stage renal disease on dialysis, was fully dialyzed today, reported history of recent pneumonia, type 2 diabetes, CAD with history of non-STEMI presenting for evaluation of shortness of breath.  She states that she had completed dialysis today and was returned to her nursing home and this afternoon developed significant shortness of breath while resting in her room, stating "I thought I was leaving this world".  Per nursing home records she had saturations in the low 80s.  She is chronically on 3 L nasal cannula.  She was given 2.5 of albuterol neb in route with some improvement in her breathing.  Apparently while at the nursing home nursing staff document a heart rate in the 20s, however she has been 75-80 heart rate during her stay here so far.  She denies any shortness of breath at this time.  She denies fevers, cough, chest pain.  She does have swelling in her ankles which she states is normal for her.  The history is provided by the patient.       Home Medications Prior to Admission medications   Medication Sig Start Date End Date Taking? Authorizing Provider  acetaminophen (TYLENOL) 325 MG tablet Take 325 mg by mouth 3 (three) times daily.   Yes [provider]  albuterol (PROVENTIL) (2.5 MG/3ML) 0.083% nebulizer solution Take 2.5 mg by nebulization every 6 (six) hours as needed for wheezing or shortness of breath.   Yes [provider]  albuterol (VENTOLIN HFA) 108 (90 Base) MCG/ACT inhaler Inhale 2 puffs into the lungs every 6 (six) hours as needed for wheezing or shortness of breath. Reported on 03/01/2015 Patient taking differently: Inhale 1-2 puffs into the lungs every 6 (six) hours as needed for wheezing or shortness of  breath. Reported on 03/01/2015 07/30/21  Yes Emokpae, Courage, MD  apixaban (ELIQUIS) 2.5 MG TABS tablet Take 2.5 mg by mouth 2 (two) times daily.   Yes [provider]  atorvastatin (LIPITOR) 40 MG tablet Take 1 tablet by mouth at bedtime. 01/22/16  Yes [provider]  dextromethorphan-guaiFENesin (MUCINEX DM) 30-600 MG 12hr tablet Take 1 tablet by mouth 2 (two) times daily. 07/30/21  Yes Emokpae, Courage, MD  ferrous sulfate 325 (65 FE) MG tablet Take 325 mg by mouth 2 (two) times daily with a meal.   Yes [provider]  folic acid (FOLVITE) 128 MCG tablet Take 800 mcg by mouth daily.   Yes [provider]  gabapentin (NEURONTIN) 300 MG capsule Take 300 mg by mouth daily. 05/11/21  Yes [provider]  metoprolol tartrate (LOPRESSOR) 25 MG tablet Take 0.5 tablets (12.5 mg total) by mouth 2 (two) times daily. 10/18/16  Yes Kathie Dike, MD  midodrine (PROAMATINE) 10 MG tablet Take 10 mg by mouth Every Tuesday,Thursday,and Saturday with dialysis.   Yes [provider]  Multiple Vitamins-Minerals (PRESERVISION AREDS) TABS Take 1 tablet by mouth at bedtime.   Yes [provider]  Nutritional Supplements (FEEDING SUPPLEMENT, NEPRO CARB STEADY,) LIQD Take 237 mLs by mouth 2 (two) times daily between meals. 06/07/21  Yes Tat, Shanon Brow, MD  predniSONE (DELTASONE) 20 MG tablet Take 2 tablets (40 mg total) by mouth daily with breakfast for 4 days. 09/01/21 09/05/21 Yes IdolAlmyra Free,  PA-C  sevelamer carbonate (RENVELA) 800 MG tablet Take 800 mg by mouth 3 (three) times daily with meals.  07/22/19  Yes [provider]  vitamin B-12 (CYANOCOBALAMIN) 1000 MCG tablet Take 1,000 mcg by mouth daily.   Yes [provider]      Allergies    Codeine    Review of Systems   Review of Systems  Constitutional:  Negative for chills and fever.  HENT:  Negative for congestion and sore throat.   Eyes: Negative.   Respiratory:  Positive for shortness  of breath. Negative for cough and chest tightness.   Cardiovascular:  Positive for leg swelling. Negative for chest pain.  Gastrointestinal:  Negative for abdominal pain, nausea and vomiting.  Genitourinary: Negative.   Musculoskeletal:  Negative for arthralgias, joint swelling and neck pain.  Skin: Negative.  Negative for rash and wound.  Neurological:  Negative for dizziness, weakness, light-headedness, numbness and headaches.  Psychiatric/Behavioral: Negative.    All other systems reviewed and are negative.   Physical Exam Updated Vital Signs BP 106/73   Pulse (!) 105   Temp (!) 97.5 F (36.4 C) (Oral)   Resp (!) 21   Ht '5\' 8"'$  (1.727 m)   Wt 81.6 kg   SpO2 98%   BMI 27.35 kg/m  Physical Exam Vitals and nursing note reviewed.  Constitutional:      Appearance: She is well-developed.  HENT:     Head: Normocephalic and atraumatic.  Eyes:     Conjunctiva/sclera: Conjunctivae normal.  Cardiovascular:     Rate and Rhythm: Normal rate and regular rhythm.     Heart sounds: Normal heart sounds.  Pulmonary:     Effort: Pulmonary effort is normal.     Breath sounds: Wheezing present. No rhonchi.     Comments: Sparse expiratory wheeze, no respiratory distress. Abdominal:     General: Bowel sounds are normal.     Palpations: Abdomen is soft.     Tenderness: There is no abdominal tenderness.  Musculoskeletal:        General: Normal range of motion.     Cervical back: Normal range of motion.     Right lower leg: Edema present.     Left lower leg: Edema present.  Skin:    General: Skin is warm and dry.  Neurological:     Mental Status: She is alert.     ED Results / Procedures / Treatments   Labs (all labs ordered are listed, but only abnormal results are displayed) Labs Reviewed  BASIC METABOLIC PANEL - Abnormal; Notable for the following components:      Result Value   Sodium 134 (*)    Chloride 97 (*)    Glucose, Bld 134 (*)    Creatinine, Ser 2.69 (*)    GFR,  Estimated 17 (*)    All other components within normal limits  CBC WITH DIFFERENTIAL/PLATELET - Abnormal; Notable for the following components:   RBC 2.99 (*)    Hemoglobin 9.7 (*)    HCT 33.4 (*)    MCV 111.7 (*)    MCHC 29.0 (*)    RDW 17.1 (*)    Platelets 119 (*)    nRBC 0.5 (*)    Abs Immature Granulocytes 0.08 (*)    All other components within normal limits  TROPONIN I (HIGH SENSITIVITY) - Abnormal; Notable for the following components:   Troponin I (High Sensitivity) 43 (*)    All other components within normal limits  RESP PANEL  BY RT-PCR (FLU A&B, COVID) [id number]    EKG EKG Interpretation  Date/Time:  Thursday September 01 2021 17:09:44 EDT Ventricular Rate:  100 PR Interval:    QRS Duration: 135 QT Interval:  336 QTC Calculation: 416 R Axis:   -27 Text Interpretation: Atrial fibrillation Right bundle branch block Abnormal T, consider ischemia, lateral leads Confirmed by Thamas Jaegers ([id number]) on 09/01/2021 7:06:05 PM  Radiology DG Chest Port 1 View  Result Date: 09/01/2021 CLINICAL DATA:  Shortness of breath.  Respiratory distress. EXAM: PORTABLE CHEST 1 VIEW COMPARISON:  08/13/2021 FINDINGS: Low lung volumes. Cardiomegaly is similar. Unchanged mediastinal contours with aortic atherosclerosis. Improvement in pulmonary edema and pleural effusions from prior exam. There is persistent bibasilar atelectasis and probable small volume pleural fluid. No pneumothorax. IMPRESSION: Improvement in pulmonary edema and pleural effusions from prior exam. Persistent bibasilar atelectasis and probable small pleural effusions. Electronically Signed   By: Keith Rake M.D.   On: 09/01/2021 18:15    Procedures Procedures    Medications Ordered in ED Medications  methylPREDNISolone sodium succinate (SOLU-MEDROL) 125 mg/2 mL injection 125 mg (125 mg Intravenous Given 09/01/21 1844)    ED Course/ Medical Decision Making/ A&P                           Medical Decision Making Patient with  what appears to have been a COPD flare which is significantly improved upon arrival here.  She had no respiratory distress since my initial exam and subsequent evaluations.  She is chronically on 3 L at baseline which she remains on here and her oxygen saturation has remained above 95%.  No accessory muscle use, no tachypnea present.  Lungs are relatively clear, trace expiratory wheeze but with fairly good aeration throughout.  She was given a IV dose of Solu-Medrol here.  She was given a 2.5 mg albuterol neb in route and required no further neb treatments while here.  She felt comfortable at time of discharge.  Amount and/or Complexity of Data Reviewed Labs: ordered.    Details: Lab tests are reassuring, troponin is modestly elevated at 43, this is consistently her baseline.  Her respiratory panel is negative. Radiology: ordered.    Details: Chest x-ray shows improvement in her chronic pulmonary edema.  She states that she did have a full dialysis session today and she believes they reached her target weight.  Risk Prescription drug management. Decision regarding hospitalization. Risk Details: No indication at this time for need of hospitalization.  Stable for transfer potation back to her nursing home.           Final Clinical Impression(s) / ED Diagnoses Final diagnoses:  COPD exacerbation ([place])    Rx / DC Orders ED Discharge Orders          Ordered    predniSONE (DELTASONE) 20 MG tablet  Daily with breakfast        09/01/21 1911              Landis Martins 09/01/21 1916    Luna Fuse, MD 09/01/21 2248

## 2021-09-02 DIAGNOSIS — Z743 Need for continuous supervision: Secondary | ICD-10-CM | POA: Diagnosis not present

## 2021-09-02 DIAGNOSIS — M6281 Muscle weakness (generalized): Secondary | ICD-10-CM | POA: Diagnosis not present

## 2021-09-02 DIAGNOSIS — R279 Unspecified lack of coordination: Secondary | ICD-10-CM | POA: Diagnosis not present

## 2021-09-02 DIAGNOSIS — Z7401 Bed confinement status: Secondary | ICD-10-CM | POA: Diagnosis not present

## 2021-09-02 DIAGNOSIS — I5033 Acute on chronic diastolic (congestive) heart failure: Secondary | ICD-10-CM | POA: Diagnosis not present

## 2021-09-02 DIAGNOSIS — R2689 Other abnormalities of gait and mobility: Secondary | ICD-10-CM | POA: Diagnosis not present

## 2021-09-02 DIAGNOSIS — I499 Cardiac arrhythmia, unspecified: Secondary | ICD-10-CM | POA: Diagnosis not present

## 2021-09-03 DIAGNOSIS — N250 Renal osteodystrophy: Secondary | ICD-10-CM | POA: Diagnosis not present

## 2021-09-03 DIAGNOSIS — N186 End stage renal disease: Secondary | ICD-10-CM | POA: Diagnosis not present

## 2021-09-03 DIAGNOSIS — Z992 Dependence on renal dialysis: Secondary | ICD-10-CM | POA: Diagnosis not present

## 2021-09-03 DIAGNOSIS — N2581 Secondary hyperparathyroidism of renal origin: Secondary | ICD-10-CM | POA: Diagnosis not present

## 2021-09-03 DIAGNOSIS — E559 Vitamin D deficiency, unspecified: Secondary | ICD-10-CM | POA: Diagnosis not present

## 2021-09-05 DIAGNOSIS — E1122 Type 2 diabetes mellitus with diabetic chronic kidney disease: Secondary | ICD-10-CM | POA: Diagnosis not present

## 2021-09-05 DIAGNOSIS — I5033 Acute on chronic diastolic (congestive) heart failure: Secondary | ICD-10-CM | POA: Diagnosis not present

## 2021-09-05 DIAGNOSIS — E785 Hyperlipidemia, unspecified: Secondary | ICD-10-CM | POA: Diagnosis not present

## 2021-09-05 DIAGNOSIS — N186 End stage renal disease: Secondary | ICD-10-CM | POA: Diagnosis not present

## 2021-09-05 DIAGNOSIS — J90 Pleural effusion, not elsewhere classified: Secondary | ICD-10-CM | POA: Diagnosis not present

## 2021-09-05 DIAGNOSIS — Z20822 Contact with and (suspected) exposure to covid-19: Secondary | ICD-10-CM | POA: Diagnosis not present

## 2021-09-05 DIAGNOSIS — I4510 Unspecified right bundle-branch block: Secondary | ICD-10-CM | POA: Diagnosis not present

## 2021-09-05 DIAGNOSIS — R531 Weakness: Secondary | ICD-10-CM | POA: Diagnosis not present

## 2021-09-05 DIAGNOSIS — J441 Chronic obstructive pulmonary disease with (acute) exacerbation: Secondary | ICD-10-CM | POA: Diagnosis not present

## 2021-09-05 DIAGNOSIS — Z79899 Other long term (current) drug therapy: Secondary | ICD-10-CM | POA: Diagnosis not present

## 2021-09-05 DIAGNOSIS — R0902 Hypoxemia: Secondary | ICD-10-CM | POA: Diagnosis not present

## 2021-09-05 DIAGNOSIS — Z992 Dependence on renal dialysis: Secondary | ICD-10-CM | POA: Diagnosis not present

## 2021-09-05 DIAGNOSIS — Z87891 Personal history of nicotine dependence: Secondary | ICD-10-CM | POA: Diagnosis not present

## 2021-09-05 DIAGNOSIS — Z7982 Long term (current) use of aspirin: Secondary | ICD-10-CM | POA: Diagnosis not present

## 2021-09-05 DIAGNOSIS — R9431 Abnormal electrocardiogram [ECG] [EKG]: Secondary | ICD-10-CM | POA: Diagnosis not present

## 2021-09-05 DIAGNOSIS — K219 Gastro-esophageal reflux disease without esophagitis: Secondary | ICD-10-CM | POA: Diagnosis not present

## 2021-09-05 DIAGNOSIS — I132 Hypertensive heart and chronic kidney disease with heart failure and with stage 5 chronic kidney disease, or end stage renal disease: Secondary | ICD-10-CM | POA: Diagnosis not present

## 2021-09-05 DIAGNOSIS — I4891 Unspecified atrial fibrillation: Secondary | ICD-10-CM | POA: Diagnosis not present

## 2021-09-05 DIAGNOSIS — Z9981 Dependence on supplemental oxygen: Secondary | ICD-10-CM | POA: Diagnosis not present

## 2021-09-05 DIAGNOSIS — R0602 Shortness of breath: Secondary | ICD-10-CM | POA: Diagnosis not present

## 2021-09-05 DIAGNOSIS — J9811 Atelectasis: Secondary | ICD-10-CM | POA: Diagnosis not present

## 2021-09-05 DIAGNOSIS — R069 Unspecified abnormalities of breathing: Secondary | ICD-10-CM | POA: Diagnosis not present

## 2021-09-06 ENCOUNTER — Other Ambulatory Visit: Payer: Self-pay

## 2021-09-06 ENCOUNTER — Encounter (HOSPITAL_COMMUNITY): Payer: Self-pay | Admitting: Family Medicine

## 2021-09-06 ENCOUNTER — Observation Stay (HOSPITAL_COMMUNITY): Payer: Medicare Other

## 2021-09-06 ENCOUNTER — Inpatient Hospital Stay (HOSPITAL_COMMUNITY)
Admission: AD | Admit: 2021-09-06 | Discharge: 2021-09-08 | DRG: 291 | Disposition: A | Discharge status: Skilled Nursing Facility | Payer: Medicare Other | Source: Other Acute Inpatient Hospital | Attending: Internal Medicine | Admitting: Internal Medicine

## 2021-09-06 DIAGNOSIS — Z87891 Personal history of nicotine dependence: Secondary | ICD-10-CM

## 2021-09-06 DIAGNOSIS — Z89422 Acquired absence of other left toe(s): Secondary | ICD-10-CM | POA: Diagnosis not present

## 2021-09-06 DIAGNOSIS — Z9981 Dependence on supplemental oxygen: Secondary | ICD-10-CM

## 2021-09-06 DIAGNOSIS — I10 Essential (primary) hypertension: Secondary | ICD-10-CM | POA: Diagnosis present

## 2021-09-06 DIAGNOSIS — Z8249 Family history of ischemic heart disease and other diseases of the circulatory system: Secondary | ICD-10-CM

## 2021-09-06 DIAGNOSIS — I071 Rheumatic tricuspid insufficiency: Secondary | ICD-10-CM | POA: Diagnosis present

## 2021-09-06 DIAGNOSIS — N186 End stage renal disease: Secondary | ICD-10-CM | POA: Diagnosis not present

## 2021-09-06 DIAGNOSIS — I5040 Unspecified combined systolic (congestive) and diastolic (congestive) heart failure: Secondary | ICD-10-CM | POA: Diagnosis not present

## 2021-09-06 DIAGNOSIS — E8779 Other fluid overload: Secondary | ICD-10-CM | POA: Diagnosis not present

## 2021-09-06 DIAGNOSIS — I2781 Cor pulmonale (chronic): Secondary | ICD-10-CM | POA: Diagnosis not present

## 2021-09-06 DIAGNOSIS — I5033 Acute on chronic diastolic (congestive) heart failure: Secondary | ICD-10-CM | POA: Diagnosis not present

## 2021-09-06 DIAGNOSIS — Z23 Encounter for immunization: Secondary | ICD-10-CM

## 2021-09-06 DIAGNOSIS — E1142 Type 2 diabetes mellitus with diabetic polyneuropathy: Secondary | ICD-10-CM | POA: Diagnosis present

## 2021-09-06 DIAGNOSIS — Z992 Dependence on renal dialysis: Secondary | ICD-10-CM

## 2021-09-06 DIAGNOSIS — R0602 Shortness of breath: Secondary | ICD-10-CM | POA: Diagnosis not present

## 2021-09-06 DIAGNOSIS — E7800 Pure hypercholesterolemia, unspecified: Secondary | ICD-10-CM | POA: Diagnosis present

## 2021-09-06 DIAGNOSIS — I132 Hypertensive heart and chronic kidney disease with heart failure and with stage 5 chronic kidney disease, or end stage renal disease: Principal | ICD-10-CM | POA: Diagnosis present

## 2021-09-06 DIAGNOSIS — N250 Renal osteodystrophy: Secondary | ICD-10-CM | POA: Diagnosis not present

## 2021-09-06 DIAGNOSIS — I4819 Other persistent atrial fibrillation: Secondary | ICD-10-CM | POA: Diagnosis not present

## 2021-09-06 DIAGNOSIS — I952 Hypotension due to drugs: Secondary | ICD-10-CM | POA: Diagnosis not present

## 2021-09-06 DIAGNOSIS — E1121 Type 2 diabetes mellitus with diabetic nephropathy: Secondary | ICD-10-CM | POA: Diagnosis not present

## 2021-09-06 DIAGNOSIS — E1122 Type 2 diabetes mellitus with diabetic chronic kidney disease: Secondary | ICD-10-CM | POA: Diagnosis not present

## 2021-09-06 DIAGNOSIS — R778 Other specified abnormalities of plasma proteins: Secondary | ICD-10-CM | POA: Diagnosis present

## 2021-09-06 DIAGNOSIS — M898X9 Other specified disorders of bone, unspecified site: Secondary | ICD-10-CM | POA: Diagnosis present

## 2021-09-06 DIAGNOSIS — I5043 Acute on chronic combined systolic (congestive) and diastolic (congestive) heart failure: Secondary | ICD-10-CM | POA: Diagnosis present

## 2021-09-06 DIAGNOSIS — E1129 Type 2 diabetes mellitus with other diabetic kidney complication: Secondary | ICD-10-CM | POA: Diagnosis not present

## 2021-09-06 DIAGNOSIS — R9431 Abnormal electrocardiogram [ECG] [EKG]: Secondary | ICD-10-CM | POA: Diagnosis not present

## 2021-09-06 DIAGNOSIS — J449 Chronic obstructive pulmonary disease, unspecified: Secondary | ICD-10-CM | POA: Diagnosis present

## 2021-09-06 DIAGNOSIS — J811 Chronic pulmonary edema: Secondary | ICD-10-CM | POA: Diagnosis not present

## 2021-09-06 DIAGNOSIS — Z79899 Other long term (current) drug therapy: Secondary | ICD-10-CM | POA: Diagnosis not present

## 2021-09-06 DIAGNOSIS — J9621 Acute and chronic respiratory failure with hypoxia: Secondary | ICD-10-CM | POA: Diagnosis not present

## 2021-09-06 DIAGNOSIS — I4891 Unspecified atrial fibrillation: Secondary | ICD-10-CM | POA: Diagnosis not present

## 2021-09-06 DIAGNOSIS — M2550 Pain in unspecified joint: Secondary | ICD-10-CM | POA: Diagnosis not present

## 2021-09-06 DIAGNOSIS — Z20822 Contact with and (suspected) exposure to covid-19: Secondary | ICD-10-CM | POA: Diagnosis not present

## 2021-09-06 DIAGNOSIS — J810 Acute pulmonary edema: Secondary | ICD-10-CM | POA: Diagnosis not present

## 2021-09-06 DIAGNOSIS — Z7901 Long term (current) use of anticoagulants: Secondary | ICD-10-CM

## 2021-09-06 DIAGNOSIS — N19 Unspecified kidney failure: Secondary | ICD-10-CM | POA: Diagnosis present

## 2021-09-06 DIAGNOSIS — J441 Chronic obstructive pulmonary disease with (acute) exacerbation: Secondary | ICD-10-CM | POA: Diagnosis not present

## 2021-09-06 DIAGNOSIS — N179 Acute kidney failure, unspecified: Secondary | ICD-10-CM | POA: Diagnosis not present

## 2021-09-06 DIAGNOSIS — Z96651 Presence of right artificial knee joint: Secondary | ICD-10-CM | POA: Diagnosis present

## 2021-09-06 DIAGNOSIS — Z7982 Long term (current) use of aspirin: Secondary | ICD-10-CM | POA: Diagnosis not present

## 2021-09-06 DIAGNOSIS — G8929 Other chronic pain: Secondary | ICD-10-CM | POA: Diagnosis present

## 2021-09-06 DIAGNOSIS — Z885 Allergy status to narcotic agent status: Secondary | ICD-10-CM

## 2021-09-06 DIAGNOSIS — D631 Anemia in chronic kidney disease: Secondary | ICD-10-CM | POA: Diagnosis not present

## 2021-09-06 DIAGNOSIS — Z8673 Personal history of transient ischemic attack (TIA), and cerebral infarction without residual deficits: Secondary | ICD-10-CM

## 2021-09-06 DIAGNOSIS — Z7401 Bed confinement status: Secondary | ICD-10-CM | POA: Diagnosis not present

## 2021-09-06 DIAGNOSIS — I4510 Unspecified right bundle-branch block: Secondary | ICD-10-CM | POA: Diagnosis not present

## 2021-09-06 DIAGNOSIS — E785 Hyperlipidemia, unspecified: Secondary | ICD-10-CM | POA: Diagnosis not present

## 2021-09-06 DIAGNOSIS — N189 Chronic kidney disease, unspecified: Secondary | ICD-10-CM | POA: Diagnosis not present

## 2021-09-06 DIAGNOSIS — K219 Gastro-esophageal reflux disease without esophagitis: Secondary | ICD-10-CM | POA: Diagnosis not present

## 2021-09-06 DIAGNOSIS — T447X5A Adverse effect of beta-adrenoreceptor antagonists, initial encounter: Secondary | ICD-10-CM | POA: Diagnosis present

## 2021-09-06 DIAGNOSIS — M549 Dorsalgia, unspecified: Secondary | ICD-10-CM | POA: Diagnosis present

## 2021-09-06 LAB — COMPREHENSIVE METABOLIC PANEL
ALT: 20 U/L (ref 0–44)
AST: 17 U/L (ref 15–41)
Albumin: 3.6 g/dL (ref 3.5–5.0)
Alkaline Phosphatase: 106 U/L (ref 38–126)
Anion gap: 11 (ref 5–15)
BUN: 45 mg/dL — ABNORMAL HIGH (ref 8–23)
CO2: 25 mmol/L (ref 22–32)
Calcium: 9.6 mg/dL (ref 8.9–10.3)
Chloride: 100 mmol/L (ref 98–111)
Creatinine, Ser: 4.92 mg/dL — ABNORMAL HIGH (ref 0.44–1.00)
GFR, Estimated: 8 mL/min — ABNORMAL LOW (ref >60)
Glucose, Bld: 127 mg/dL — ABNORMAL HIGH (ref 70–99)
Potassium: 4.3 mmol/L (ref 3.5–5.1)
Sodium: 136 mmol/L (ref 135–145)
Total Bilirubin: 1 mg/dL (ref 0.3–1.2)
Total Protein: 6.3 g/dL — ABNORMAL LOW (ref 6.5–8.1)

## 2021-09-06 LAB — HEPATITIS B SURFACE ANTIBODY,QUALITATIVE: Hep B S Ab: REACTIVE — AB

## 2021-09-06 LAB — CBC
HCT: 33.4 % — ABNORMAL LOW (ref 36.0–46.0)
Hemoglobin: 9.7 g/dL — ABNORMAL LOW (ref 12.0–15.0)
MCH: 32.3 pg (ref 26.0–34.0)
MCHC: 29 g/dL — ABNORMAL LOW (ref 30.0–36.0)
MCV: 111.3 fL — ABNORMAL HIGH (ref 80.0–100.0)
Platelets: 104 10*3/uL — ABNORMAL LOW (ref 150–400)
RBC: 3 MIL/uL — ABNORMAL LOW (ref 3.87–5.11)
RDW: 17.8 % — ABNORMAL HIGH (ref 11.5–15.5)
WBC: 4.1 10*3/uL (ref 4.0–10.5)
nRBC: 1 % — ABNORMAL HIGH (ref 0.0–0.2)

## 2021-09-06 LAB — HEPATITIS B CORE ANTIBODY, TOTAL: Hep B Core Total Ab: NONREACTIVE

## 2021-09-06 LAB — HEPATITIS B SURFACE ANTIGEN: Hepatitis B Surface Ag: NONREACTIVE

## 2021-09-06 LAB — TROPONIN I (HIGH SENSITIVITY)
Troponin I (High Sensitivity): 44 ng/L — ABNORMAL HIGH (ref <18)
Troponin I (High Sensitivity): 44 ng/L — ABNORMAL HIGH (ref <18)

## 2021-09-06 LAB — MAGNESIUM: Magnesium: 2.5 mg/dL — ABNORMAL HIGH (ref 1.7–2.4)

## 2021-09-06 LAB — PHOSPHORUS: Phosphorus: 5.6 mg/dL — ABNORMAL HIGH (ref 2.5–4.6)

## 2021-09-06 LAB — GLUCOSE, CAPILLARY: Glucose-Capillary: 132 mg/dL — ABNORMAL HIGH (ref 70–99)

## 2021-09-06 LAB — HEPATITIS C ANTIBODY: HCV Ab: NONREACTIVE

## 2021-09-06 MED ORDER — ACETAMINOPHEN 325 MG PO TABS
650.0000 mg | ORAL_TABLET | Freq: Four times a day (QID) | ORAL | Status: DC | PRN
  Administered 2021-09-08 (×2): 650 mg via ORAL
  Filled 2021-09-06 (×2): qty 2

## 2021-09-06 MED ORDER — DARBEPOETIN ALFA 200 MCG/0.4ML IJ SOSY
200.0000 ug | PREFILLED_SYRINGE | INTRAMUSCULAR | Status: DC
Start: 2021-09-06 — End: 2021-09-08
  Filled 2021-09-06: qty 0.4

## 2021-09-06 MED ORDER — MIDODRINE HCL 5 MG PO TABS: 10.0000 mg | ORAL_TABLET | Freq: Two times a day (BID) | ORAL | Status: DC

## 2021-09-06 MED ORDER — APIXABAN 2.5 MG PO TABS
2.5000 mg | ORAL_TABLET | Freq: Two times a day (BID) | ORAL | Status: DC
  Administered 2021-09-06 – 2021-09-08 (×4): 2.5 mg via ORAL
  Filled 2021-09-06 (×4): qty 1

## 2021-09-06 MED ORDER — DARBEPOETIN ALFA 200 MCG/0.4ML IJ SOSY: 200.0000 ug | PREFILLED_SYRINGE | INTRAMUSCULAR | Status: DC

## 2021-09-06 MED ORDER — INSULIN ASPART 100 UNIT/ML IJ SOLN
0.0000 [IU] | Freq: Three times a day (TID) | INTRAMUSCULAR | Status: DC
  Administered 2021-09-08: 1 [IU] via SUBCUTANEOUS

## 2021-09-06 MED ORDER — SODIUM CHLORIDE 0.9 % IV SOLN: 250.0000 mL | INTRAVENOUS | Status: DC | PRN

## 2021-09-06 MED ORDER — SODIUM CHLORIDE 0.9 % IV SOLN
62.5000 mg | INTRAVENOUS | Status: DC
  Filled 2021-09-06: qty 5

## 2021-09-06 MED ORDER — ACETAMINOPHEN 650 MG RE SUPP: 650.0000 mg | Freq: Four times a day (QID) | RECTAL | Status: DC | PRN

## 2021-09-06 MED ORDER — DARBEPOETIN ALFA 200 MCG/0.4ML IJ SOSY
PREFILLED_SYRINGE | INTRAMUSCULAR | Status: AC
  Filled 2021-09-06: qty 0.4

## 2021-09-06 MED ORDER — CHLORHEXIDINE GLUCONATE CLOTH 2 % EX PADS
6.0000 | MEDICATED_PAD | Freq: Every day | CUTANEOUS | Status: DC
Start: 2021-09-07 — End: 2021-09-08

## 2021-09-06 MED ORDER — CHLORHEXIDINE GLUCONATE CLOTH 2 % EX PADS
6.0000 | MEDICATED_PAD | Freq: Every day | CUTANEOUS | Status: DC
Start: 2021-09-07 — End: 2021-09-08
  Administered 2021-09-07: 6 via TOPICAL

## 2021-09-06 MED ORDER — SODIUM CHLORIDE 0.9% FLUSH
3.0000 mL | Freq: Two times a day (BID) | INTRAVENOUS | Status: DC
Start: 2021-09-06 — End: 2021-09-08
  Administered 2021-09-06 – 2021-09-07 (×2): 3 mL via INTRAVENOUS

## 2021-09-06 MED ORDER — SODIUM CHLORIDE 0.9 % IV SOLN
62.5000 mg | INTRAVENOUS | Status: DC
  Administered 2021-09-06: 62.5 mg via INTRAVENOUS
  Filled 2021-09-06: qty 5

## 2021-09-06 MED ORDER — SEVELAMER CARBONATE 800 MG PO TABS
800.0000 mg | ORAL_TABLET | Freq: Three times a day (TID) | ORAL | Status: DC
  Administered 2021-09-07 – 2021-09-08 (×5): 800 mg via ORAL
  Filled 2021-09-06 (×5): qty 1

## 2021-09-06 MED ORDER — MIDODRINE HCL 5 MG PO TABS
10.0000 mg | ORAL_TABLET | Freq: Two times a day (BID) | ORAL | Status: DC
Start: 2021-09-06 — End: 2021-09-08
  Administered 2021-09-06 – 2021-09-08 (×5): 10 mg via ORAL
  Filled 2021-09-06 (×5): qty 2

## 2021-09-06 MED ORDER — SODIUM CHLORIDE 0.9% FLUSH: 3.0000 mL | INTRAVENOUS | Status: DC | PRN

## 2021-09-06 NOTE — Assessment & Plan Note (Signed)
Patient with acute on chronic respiratory failure, elevated BNP and CXR with bilateral effusions and significant LE edema in acute on chronic diastolic CHF -volume control per dialysis -strict I/O -holding coreg with soft blood pressures -last echo 05/2021: EF of 76%, diastolic parameters indeterminate. Mildly reduced RVF.

## 2021-09-06 NOTE — Consult Note (Signed)
Renal Service Consult Note [facility]  Jody [name] 09/06/2021 Jody Blazing, MD Requesting Physician: Dr. Rogers Blocker  Reason for Consult: ESRD pt w/ volume overload and SOB HPI: The patient is a 81 y.o. year-old w/ hx of anemia, GIB, chronic atrial fib, back pain, ESRD on HD, COPD on home O2, HL, HTN, obestiy, sp CVA, DM2 who presented to ED c/o SOB.  Lives in a SNF now. In ED CXR showed mild CHF, BNP > 70,000, trops flat, CBC stable, creat 4. Pt tx'd to [facility] from OSH ED for dialysis. We are asked to see for dialysis.   Pt seen in her room, not in distress. On HD x 8 years approx. Lives in a SNF now. No recent access issues. Denies large fliud intakes.   ROS - denies CP, no joint pain, no HA, no blurry vision, no rash, no diarrhea, no nausea/ vomiting, no dysuria, no difficulty voiding   Past Medical History  Past Medical History:  Diagnosis Date   Anemia in chronic kidney disease (CODE) 06/05/2015   Arthritis    "bad in my legs" (12/07/2014)   Chronic atrial fibrillation ([place]) 06/05/2015   Chronic back pain    "dr said my spine is crooked"   Chronic bronchitis ([place])    "get it q yr" (12/07/2014)   CKD stage 4 due to type 2 diabetes mellitus ([place]) 5/78/4696   Complication of anesthesia    headache for 2 days after surgery in May 2019   COPD (chronic obstructive pulmonary disease) ([place])    Gait difficulty 09/02/2014   Hypercholesterolemia    Hypertension    Neuropathy 2015   in legs   NSVT (nonsustained ventricular tachycardia) ([place])    Sinus brady-tachy syndrome ([place])    Stroke ([place]) 05/2014   denies residual on 12/07/2014   Type II diabetes mellitus [facility])    Past Surgical History  Past Surgical History:  Procedure Laterality Date   ABDOMINAL AORTOGRAM W/LOWER EXTREMITY Left 04/06/2021   Procedure: ABDOMINAL AORTOGRAM W/LOWER EXTREMITY;  Surgeon: Broadus John, MD;  Location: [place] [facility];  Service: Cardiovascular;  Laterality: Left;   ABDOMINAL  HYSTERECTOMY  ~ 1970   AMPUTATION TOE Left 04/08/2021   Procedure: FIRST TOE LEFT FOOT AMPUTATION;  Surgeon: Trula Slade, DPM;  Location: [place];  Service: Podiatry;  Laterality: Left;   APPENDECTOMY  ~ [place] Right 07/06/2017   Procedure: RIGHT RADIOCEPHALIC ARTERIOVENOUS FISTULA creation;  Surgeon: Conrad [place], MD;  Location: [place];  Service: Vascular;  Laterality: Right;   [place] Right 09/17/2017   Procedure: SECOND STAGE BASILIC VEIN TRANSPOSITION RIGHT ARM;  Surgeon: Rosetta Posner, MD;  Location: [place];  Service: Vascular;  Laterality: Right;   COLONOSCOPY N/A 10/28/2017   Procedure: COLONOSCOPY;  Surgeon: Rogene Houston, MD;  [facility];  Service: Endoscopy;  Laterality: N/A;   COLONOSCOPY WITH PROPOFOL N/A 12/29/2019   Procedure: COLONOSCOPY WITH PROPOFOL;  Surgeon: Eloise Harman, DO;  [facility];  Service: Endoscopy;  Laterality: N/A;   ESOPHAGOGASTRODUODENOSCOPY (EGD) WITH PROPOFOL N/A 10/31/2017   Procedure: ESOPHAGOGASTRODUODENOSCOPY (EGD) WITH PROPOFOL;  Surgeon: Rogene Houston, MD;  [facility];  Service: Endoscopy;  Laterality: N/A;   JOINT REPLACEMENT     PERIPHERAL VASCULAR BALLOON ANGIOPLASTY Left 04/06/2021   Procedure: PERIPHERAL VASCULAR BALLOON ANGIOPLASTY;  Surgeon: Broadus John, MD;  Location: [place] [facility];  Service: Cardiovascular;  Laterality: Left;  left sfa succesful [place] AT Unsuccesful   TOTAL KNEE ARTHROPLASTY Right ~ 1986   TUMOR EXCISION  ~ 1970   "9# in my stomach"   Family History  Family History  Problem Relation Age of Onset   Cancer Other    Heart attack Brother    Cancer Sister        breast   Alcohol abuse Brother    Social History  reports that she quit smoking about 17 years ago. Her smoking use included cigarettes. She started smoking about 68 years ago. She has a 25.00 pack-year smoking history. She has never been exposed to tobacco smoke. She has  never used smokeless tobacco. She reports that she does not drink alcohol and does not use drugs. Allergies  Allergies  Allergen Reactions   Codeine Nausea And Vomiting   Home medications Prior to Admission medications   Medication Sig Start Date End Date Taking? Authorizing Provider  acetaminophen (TYLENOL) 325 MG tablet Take 325 mg by mouth 3 (three) times daily.    [provider]  albuterol (PROVENTIL) (2.5 MG/3ML) 0.083% nebulizer solution Take 2.5 mg by nebulization every 6 (six) hours as needed for wheezing or shortness of breath.    [provider]  albuterol (VENTOLIN HFA) 108 (90 Base) MCG/ACT inhaler Inhale 2 puffs into the lungs every 6 (six) hours as needed for wheezing or shortness of breath. Reported on 03/01/2015 Patient taking differently: Inhale 1-2 puffs into the lungs every 6 (six) hours as needed for wheezing or shortness of breath. Reported on 03/01/2015 07/30/21   Roxan Hockey, MD  apixaban (ELIQUIS) 2.5 MG TABS tablet Take 2.5 mg by mouth 2 (two) times daily.    [provider]  atorvastatin (LIPITOR) 40 MG tablet Take 1 tablet by mouth at bedtime. 01/22/16   [provider]  dextromethorphan-guaiFENesin (MUCINEX DM) 30-600 MG 12hr tablet Take 1 tablet by mouth 2 (two) times daily. 07/30/21   Roxan Hockey, MD  ferrous sulfate 325 (65 FE) MG tablet Take 325 mg by mouth 2 (two) times daily with a meal.    [provider]  folic acid (FOLVITE) 846 MCG tablet Take 800 mcg by mouth daily.    [provider]  gabapentin (NEURONTIN) 300 MG capsule Take 300 mg by mouth daily. 05/11/21   [provider]  metoprolol tartrate (LOPRESSOR) 25 MG tablet Take 0.5 tablets (12.5 mg total) by mouth 2 (two) times daily. 10/18/16   Kathie Dike, MD  midodrine (PROAMATINE) 10 MG tablet Take 10 mg by mouth Every Tuesday,Thursday,and Saturday with dialysis.    [provider]  Multiple Vitamins-Minerals (PRESERVISION  AREDS) TABS Take 1 tablet by mouth at bedtime.    [provider]  Nutritional Supplements (FEEDING SUPPLEMENT, NEPRO CARB STEADY,) LIQD Take 237 mLs by mouth 2 (two) times daily between meals. 06/07/21   Orson Eva, MD  sevelamer carbonate (RENVELA) 800 MG tablet Take 800 mg by mouth 3 (three) times daily with meals.  07/22/19   [provider]  vitamin B-12 (CYANOCOBALAMIN) 1000 MCG tablet Take 1,000 mcg by mouth daily.    [provider]     Vitals:   09/06/21 1414  BP: 106/63  Pulse: 82  Resp: 18  Temp: 98 F (36.7 C)  TempSrc: Oral  SpO2: 100%  Weight: 83 kg  Height: '5\' 2"'$  (1.575 m)   Exam Gen alert, no distress, nasal O2 No rash, cyanosis or gangrene Sclera anicteric, throat clear  No jvd or bruits  Chest clear bilat to bases, no rales/ wheezing RRR no MRG Abd soft ntnd no mass or ascites +bs GU defer MS no joint effusions or deformity Ext diffuse 1-2+ bilat pitting pretib and hip edema Neuro is alert, Ox 3 , nf    RUA AVF+bruit      Home meds include - albutoerl, apixaban, atorvastatin, gabapentin, metoprolol 12.5 bid, midodrine 10 pre hd tts, MVI, nepro, sevelamer carbonate 1 ac tid, vits/ supps/ prns     OP HD: [facility]   3h 48mn    84kg  400/500  2/2.5    RUA AVF 15ga   Heparin none (hx GIB) - mircera 200 mcg, due 7/25 - venofer 50 mg weekly  Assessment/ Plan: Vol overload/ acute on chronic resp failure w/ hypoxia - obvious vol^ on exam w/ bilat LE edema, no resp distress, mild CHF by OSH CXR. Will repeat here. Plan HD today and tomorrow again given amount of fluid overload by exam. OP HD unit states low BP's have been an issue not allowing large UF goals. Will ^ her midodrine to 10 bid.  ESRD - on HD TTS. Today is her HD day, has not missed. HD today.  Atrial fib - on eliquis, metoprolol Hypotension - chronic issue, will ^midodrine to 10 mg bid Anemia esrd - Hb 9.7, due for esa today, will order darbe 200 mcg weekly while  here on Tuesdays. Cont weekly IV Fe here.  MBD ckd - Ca in range, get alb/ phos as add-ons. Cont renvela as binder.    RKelly Splinter MD 09/06/2021, 3:29 PM   Recent Labs  Lab 09/01/21 1724  HGB 9.7*  CALCIUM 9.1  CREATININE 2.69*  K 3.6    Inpatient medications:  [START ON 09/07/2021] Chlorhexidine Gluconate Cloth  6 each Topical Q0600   [START ON 09/07/2021] Chlorhexidine Gluconate Cloth  6 each Topical Q0600   [START ON 09/13/2021] darbepoetin (ARANESP) injection - DIALYSIS  200 mcg Intravenous Q Tue-HD   insulin aspart  0-6 Units Subcutaneous TID WC   midodrine  10 mg Oral BID WC   [START ON 09/07/2021] sevelamer carbonate  800 mg Oral TID WC   sodium chloride flush  3 mL Intravenous Q12H    sodium chloride     [START ON 09/13/2021] ferric gluconate (FERRLECIT) IVPB     sodium chloride, acetaminophen **OR** acetaminophen, sodium chloride flush

## 2021-09-06 NOTE — Assessment & Plan Note (Addendum)
Rate controlled Holding coreg with soft blood pressures Continue eliquis

## 2021-09-06 NOTE — H&P (Signed)
History and Physical    Patient: Jody [name] ZOX:[id number] DOB: 1940-12-30 DOA: 09/06/2021 DOS: the patient was seen and examined on 09/06/2021 PCP: Monico Blitz, MD  Patient coming from: Outside Hospital - lives in a SNF. WC/bedbound    Chief Complaint: shortness of breath/hypoxia   HPI: Jody [name] is a 81 y.o. female with medical history significant of atrial fibrillation on Eliquis, T2DM,  hypertension, COPD with chronic hypoxic respiratory failure on 2-3L oxygen, HFpEF, and ESRD on hemodialysis TTS who presented to ED with shortness of breath. She states she could not catch her breath yesterday. She denies any cough or fevers. She has had leg swelling over the last 3-4 days. Denies drinking excessive fluids or eating salt. Has lost some weight.   Per her POA she has family who will bring her food into the SNF that she shouldn't eat. Fried, salty foods. They typically bring it on the weekends and sometimes during the week.    Denies any fever/chills, vision changes/headaches, chest pain or palpitations, shortness of breath or cough, abdominal pain, N/V/D, dysuria.    She does not smoke or drink alcohol.   [facility] ED course: presented to ED with shortness of breath and oxygen into the 50-60s at SNF and placed on 15L oxygen. Last dialysis session on 09/04/21. Was also hypotensive. Blood pressure responded to IVF. BNP elevated >70,000, CXR with trace bilateral pleural effusions. Stable cardiomegaly. Findings concerning for mild CHF.  Covid/flu/RSV negative. Troponin 45>54. Cbc stable with no leukocytosis BUN: 41, creatinine: 4.28, potassium: 5.4  Arterial ph: 7.28, o2: 72,  Concern for decompensated CHF and need for HD so transferred to [facility]. Accepted for admission by [facility].    Review of Systems: As mentioned in the history of present illness. All other systems reviewed and are negative. Past Medical History:  Diagnosis Date   Anemia in chronic kidney disease (CODE)  06/05/2015   Arthritis    "bad in my legs" (12/07/2014)   Chronic atrial fibrillation ([place]) 06/05/2015   Chronic back pain    "dr said my spine is crooked"   Chronic bronchitis ([place])    "get it q yr" (81/19/1478)   Complication of anesthesia    headache for 2 days after surgery in May 2019   COPD (chronic obstructive pulmonary disease) ([place])    ESRD on hemodialysis ([place]) 06/05/2015   Gait difficulty 09/02/2014   Hypercholesterolemia    Hypertension    Neuropathy 2015   in legs   NSVT (nonsustained ventricular tachycardia) ([place])    Sinus brady-tachy syndrome ([place])    Stroke ([place]) 05/2014   denies residual on 12/07/2014   Type II diabetes mellitus [facility])    Past Surgical History:  Procedure Laterality Date   ABDOMINAL AORTOGRAM W/LOWER EXTREMITY Left 04/06/2021   Procedure: ABDOMINAL AORTOGRAM W/LOWER EXTREMITY;  Surgeon: Broadus John, MD;  Location: [place] [facility];  Service: Cardiovascular;  Laterality: Left;   ABDOMINAL HYSTERECTOMY  ~ 1970   AMPUTATION TOE Left 04/08/2021   Procedure: FIRST TOE LEFT FOOT AMPUTATION;  Surgeon: Trula Slade, DPM;  Location: [place];  Service: Podiatry;  Laterality: Left;   APPENDECTOMY  ~ [place] Right 07/06/2017   Procedure: RIGHT RADIOCEPHALIC ARTERIOVENOUS FISTULA creation;  Surgeon: Conrad Mendes, MD;  Location: [place];  Service: Vascular;  Laterality: Right;   [place] Right 09/17/2017   Procedure: SECOND STAGE BASILIC VEIN TRANSPOSITION RIGHT ARM;  Surgeon: Rosetta Posner, MD;  Location: [facility]  OR;  Service: Vascular;  Laterality: Right;   COLONOSCOPY N/A 10/28/2017   Procedure: COLONOSCOPY;  Surgeon: Rogene Houston, MD;  [facility];  Service: Endoscopy;  Laterality: N/A;   COLONOSCOPY WITH PROPOFOL N/A 12/29/2019   Procedure: COLONOSCOPY WITH PROPOFOL;  Surgeon: Eloise Harman, DO;  [facility];  Service: Endoscopy;  Laterality: N/A;   ESOPHAGOGASTRODUODENOSCOPY (EGD)  WITH PROPOFOL N/A 10/31/2017   Procedure: ESOPHAGOGASTRODUODENOSCOPY (EGD) WITH PROPOFOL;  Surgeon: Rogene Houston, MD;  [facility];  Service: Endoscopy;  Laterality: N/A;   JOINT REPLACEMENT     PERIPHERAL VASCULAR BALLOON ANGIOPLASTY Left 04/06/2021   Procedure: PERIPHERAL VASCULAR BALLOON ANGIOPLASTY;  Surgeon: Broadus John, MD;  Location: [place] [facility];  Service: Cardiovascular;  Laterality: Left;  left sfa succesful [place] AT Unsuccesful   TOTAL KNEE ARTHROPLASTY Right ~ [place]  ~ 1970   "9# in my stomach"   Social History:  reports that she quit smoking about 17 years ago. Her smoking use included cigarettes. She started smoking about 68 years ago. She has a 25.00 pack-year smoking history. She has never been exposed to tobacco smoke. She has never used smokeless tobacco. She reports that she does not drink alcohol and does not use drugs.  Allergies  Allergen Reactions   Codeine Nausea And Vomiting    Family History  Problem Relation Age of Onset   Cancer Other    Heart attack Brother    Cancer Sister        breast   Alcohol abuse Brother     Prior to Admission medications   Medication Sig Start Date End Date Taking? Authorizing Provider  acetaminophen (TYLENOL) 325 MG tablet Take 325 mg by mouth 3 (three) times daily.    [provider]  albuterol (PROVENTIL) (2.5 MG/3ML) 0.083% nebulizer solution Take 2.5 mg by nebulization every 6 (six) hours as needed for wheezing or shortness of breath.    [provider]  albuterol (VENTOLIN HFA) 108 (90 Base) MCG/ACT inhaler Inhale 2 puffs into the lungs every 6 (six) hours as needed for wheezing or shortness of breath. Reported on 03/01/2015 Patient taking differently: Inhale 1-2 puffs into the lungs every 6 (six) hours as needed for wheezing or shortness of breath. Reported on 03/01/2015 07/30/21   Roxan Hockey, MD  apixaban (ELIQUIS) 2.5 MG TABS tablet Take 2.5 mg by mouth 2 (two)  times daily.    [provider]  atorvastatin (LIPITOR) 40 MG tablet Take 1 tablet by mouth at bedtime. 01/22/16   [provider]  dextromethorphan-guaiFENesin (MUCINEX DM) 30-600 MG 12hr tablet Take 1 tablet by mouth 2 (two) times daily. 07/30/21   Roxan Hockey, MD  ferrous sulfate 325 (65 FE) MG tablet Take 325 mg by mouth 2 (two) times daily with a meal.    [provider]  folic acid (FOLVITE) 643 MCG tablet Take 800 mcg by mouth daily.    [provider]  gabapentin (NEURONTIN) 300 MG capsule Take 300 mg by mouth daily. 05/11/21   [provider]  metoprolol tartrate (LOPRESSOR) 25 MG tablet Take 0.5 tablets (12.5 mg total) by mouth 2 (two) times daily. 10/18/16   Kathie Dike, MD  midodrine (PROAMATINE) 10 MG tablet Take 10 mg by mouth Every Tuesday,Thursday,and Saturday with dialysis.    [provider]  Multiple Vitamins-Minerals (PRESERVISION AREDS) TABS Take 1 tablet by mouth at bedtime.    [provider]  Nutritional  Supplements (FEEDING SUPPLEMENT, NEPRO CARB STEADY,) LIQD Take 237 mLs by mouth 2 (two) times daily between meals. 06/07/21   Orson Eva, MD  sevelamer carbonate (RENVELA) 800 MG tablet Take 800 mg by mouth 3 (three) times daily with meals.  07/22/19   [provider]  vitamin B-12 (CYANOCOBALAMIN) 1000 MCG tablet Take 1,000 mcg by mouth daily.    [provider]    Physical Exam: Vitals:   09/06/21 1930 09/06/21 2000 09/06/21 2015 09/06/21 2030  BP: (!) 94/50 108/81 (!) 102/55 92/79  Pulse: (!) 30  (!) 29 71  Resp: '20 20 10 20  '$ Temp:      TempSrc:      SpO2: 100% 100% 99% 100%  Weight:      Height:       General:  Appears calm and comfortable and is in NAD Eyes:  PERRL, EOMI, normal lids, iris ENT:  HOH,  lips & tongue, mmm; missing teeth  Neck:  no LAD, masses or thyromegaly; no carotid bruits Cardiovascular: regular, irregular, no m/r/g. 3+ pitting edema BLE  Respiratory:   CTA  bilaterally with no wheezes/rales/rhonchi.  Normal respiratory effort. Abdomen:  soft, NT, ND, NABS Back:   normal alignment, no CVAT Skin:  no rash or induration seen on limited exam. AAVF in RUE, good thrill.  Musculoskeletal:  grossly normal tone BUE/BLE, good ROM, no bony abnormality Lower extremity:  limited foot exam with no ulcerations.  2+ distal pulses. Left big toe amputation  Psychiatric:  grossly normal mood and affect, speech fluent and appropriate, AOx3 Neurologic:  CN 2-12 grossly intact, moves all extremities in coordinated fashion, sensation intact   Radiological Exams on Admission: Independently reviewed - see discussion in A/P where applicable  No results found.  EKG: pending    Labs on Admission: I have personally reviewed the available labs and imaging studies at the time of the admission.  Pertinent labs from ER visit at [facility] from 09/05/21.  Covid/flu/RSV negative.  Troponin 45>54.  Cbc stable with no leukocytosis  BUN: 41,  creatinine: 4.28,  potassium: 5.4   Assessment and Plan: Principal Problem:   Acute on chronic diastolic CHF (congestive heart failure) (HCC) Active Problems:   Acute on chronic respiratory failure with hypoxia (HCC)   ESRD on dialysis (HCC)   Elevated troponin   Persistent atrial fibrillation ([place])   Essential hypertension   Type 2 diabetes mellitus with nephropathy (HCC)    Assessment and Plan: * Acute on chronic diastolic CHF (congestive heart failure) ([facility]) Patient with acute on chronic respiratory failure, elevated BNP and CXR with bilateral effusions and significant LE edema in acute on chronic diastolic CHF -volume control per dialysis -strict I/O -holding coreg with soft blood pressures -last echo 05/2021: EF of 30%, diastolic parameters indeterminate. Mildly reduced RVF.   Acute on chronic respiratory failure with hypoxia [facility]) 81 year old female presenting to ED with shortness of breath found to be hypoxic  to 50-60% and requiring 15L of oxygen and volume overloaded.  Initially wears 2-3L oxygen, but hypoxic to 50-60% requiring 15L with Ems and [facility], weaned down to 4-5L Likely secondary to acute on chronic diastolic CHF/volume overload.  Volume control per dialysis Wean oxygen as tolerated.   ESRD on dialysis [facility]) Typically has dialysis on TTS with last session on Saturday.  Volume overloaded and may need subsequent days of dialysis  Per her POA, she has had a poor diet that family brings in over the weekend that  is salty and fried and foods that she can not eat.  Nephrology consulted with plan for HD today Appreciate consult   Elevated troponin Troponin flat 45>54. Repeat here at 44. Close to her baseline Likely from demand ischemia in setting of acute on chronic respiratory failure and decompensated CHF Chest pain free, ekg pending Continue to monitor on telemetry   Persistent atrial fibrillation (HCC) Rate controlled Holding coreg with soft blood pressures Continue eliquis   Essential hypertension Soft blood pressures Hold coreg at this time Continue midodrine with HD   Type 2 diabetes mellitus with nephropathy (HCC) A1C of 5.7 in 07/2021 Diet controlled Very sensitive SSI and accuchecks qac/hs     Advance Care Planning:   Code Status: Full Code   Consults: nephrology: Dr. Moshe Cipro  DVT Prophylaxis: eliquis   Family Communication: updated angie dalton, POA by phone: [phone]  Severity of Illness: The appropriate patient status for this patient is OBSERVATION. Observation status is judged to be reasonable and necessary in order to provide the required intensity of service to ensure the patient's safety. The patient's presenting symptoms, physical exam findings, and initial radiographic and laboratory data in the context of their medical condition is felt to place them at decreased risk for further clinical deterioration. Furthermore, it is anticipated  that the patient will be medically stable for discharge from the hospital within 2 midnights of admission.   Author: Orma Flaming, MD 09/06/2021 8:50 PM  For on call review [email or web address].

## 2021-09-06 NOTE — Assessment & Plan Note (Addendum)
81 year old female presenting to ED with shortness of breath found to be hypoxic to 50-60% and requiring 15L of oxygen and volume overloaded.  Initially wears 2-3L oxygen, but hypoxic to 50-60% requiring 15L with Ems and [facility], weaned down to 4-5L Likely secondary to acute on chronic diastolic CHF/volume overload.  Volume control per dialysis Wean oxygen as tolerated.

## 2021-09-06 NOTE — Assessment & Plan Note (Addendum)
A1C of 5.7 in 07/2021 Diet controlled Very sensitive SSI and accuchecks qac/hs

## 2021-09-06 NOTE — Assessment & Plan Note (Addendum)
Troponin flat 45>54. Repeat here at 44. Close to her baseline Likely from demand ischemia in setting of acute on chronic respiratory failure and decompensated CHF Chest pain free, ekg pending Continue to monitor on telemetry

## 2021-09-06 NOTE — Assessment & Plan Note (Addendum)
Typically has dialysis on TTS with last session on Saturday.  Volume overloaded and may need subsequent days of dialysis  Per her POA, she has had a poor diet that family brings in over the weekend that is salty and fried and foods that she can not eat.  Nephrology consulted with plan for HD today Appreciate consult

## 2021-09-06 NOTE — Assessment & Plan Note (Signed)
Soft blood pressures Hold coreg at this time Continue midodrine with HD

## 2021-09-06 NOTE — Plan of Care (Signed)

## 2021-09-07 ENCOUNTER — Other Ambulatory Visit: Payer: Self-pay

## 2021-09-07 DIAGNOSIS — I4819 Other persistent atrial fibrillation: Secondary | ICD-10-CM

## 2021-09-07 DIAGNOSIS — I2781 Cor pulmonale (chronic): Secondary | ICD-10-CM | POA: Diagnosis present

## 2021-09-07 DIAGNOSIS — I10 Essential (primary) hypertension: Secondary | ICD-10-CM

## 2021-09-07 DIAGNOSIS — Z7901 Long term (current) use of anticoagulants: Secondary | ICD-10-CM | POA: Diagnosis not present

## 2021-09-07 DIAGNOSIS — I5033 Acute on chronic diastolic (congestive) heart failure: Secondary | ICD-10-CM | POA: Diagnosis present

## 2021-09-07 DIAGNOSIS — I071 Rheumatic tricuspid insufficiency: Secondary | ICD-10-CM | POA: Diagnosis present

## 2021-09-07 DIAGNOSIS — Z23 Encounter for immunization: Secondary | ICD-10-CM | POA: Diagnosis not present

## 2021-09-07 DIAGNOSIS — Z79899 Other long term (current) drug therapy: Secondary | ICD-10-CM | POA: Diagnosis not present

## 2021-09-07 DIAGNOSIS — E7800 Pure hypercholesterolemia, unspecified: Secondary | ICD-10-CM | POA: Diagnosis present

## 2021-09-07 DIAGNOSIS — N179 Acute kidney failure, unspecified: Secondary | ICD-10-CM

## 2021-09-07 DIAGNOSIS — N186 End stage renal disease: Secondary | ICD-10-CM | POA: Diagnosis present

## 2021-09-07 DIAGNOSIS — Z8249 Family history of ischemic heart disease and other diseases of the circulatory system: Secondary | ICD-10-CM | POA: Diagnosis not present

## 2021-09-07 DIAGNOSIS — I952 Hypotension due to drugs: Secondary | ICD-10-CM | POA: Diagnosis present

## 2021-09-07 DIAGNOSIS — Z8673 Personal history of transient ischemic attack (TIA), and cerebral infarction without residual deficits: Secondary | ICD-10-CM | POA: Diagnosis not present

## 2021-09-07 DIAGNOSIS — N189 Chronic kidney disease, unspecified: Secondary | ICD-10-CM

## 2021-09-07 DIAGNOSIS — Z89422 Acquired absence of other left toe(s): Secondary | ICD-10-CM | POA: Diagnosis not present

## 2021-09-07 DIAGNOSIS — M898X9 Other specified disorders of bone, unspecified site: Secondary | ICD-10-CM | POA: Diagnosis present

## 2021-09-07 DIAGNOSIS — Z87891 Personal history of nicotine dependence: Secondary | ICD-10-CM | POA: Diagnosis not present

## 2021-09-07 DIAGNOSIS — D631 Anemia in chronic kidney disease: Secondary | ICD-10-CM | POA: Diagnosis present

## 2021-09-07 DIAGNOSIS — I739 Peripheral vascular disease, unspecified: Secondary | ICD-10-CM

## 2021-09-07 DIAGNOSIS — E1121 Type 2 diabetes mellitus with diabetic nephropathy: Secondary | ICD-10-CM

## 2021-09-07 DIAGNOSIS — Z992 Dependence on renal dialysis: Secondary | ICD-10-CM

## 2021-09-07 DIAGNOSIS — I132 Hypertensive heart and chronic kidney disease with heart failure and with stage 5 chronic kidney disease, or end stage renal disease: Secondary | ICD-10-CM | POA: Diagnosis present

## 2021-09-07 DIAGNOSIS — J449 Chronic obstructive pulmonary disease, unspecified: Secondary | ICD-10-CM | POA: Diagnosis present

## 2021-09-07 DIAGNOSIS — E1122 Type 2 diabetes mellitus with diabetic chronic kidney disease: Secondary | ICD-10-CM | POA: Diagnosis present

## 2021-09-07 DIAGNOSIS — Z96651 Presence of right artificial knee joint: Secondary | ICD-10-CM | POA: Diagnosis present

## 2021-09-07 DIAGNOSIS — J9621 Acute and chronic respiratory failure with hypoxia: Secondary | ICD-10-CM | POA: Diagnosis present

## 2021-09-07 DIAGNOSIS — Z20822 Contact with and (suspected) exposure to covid-19: Secondary | ICD-10-CM | POA: Diagnosis present

## 2021-09-07 DIAGNOSIS — N19 Unspecified kidney failure: Secondary | ICD-10-CM | POA: Diagnosis present

## 2021-09-07 DIAGNOSIS — Z885 Allergy status to narcotic agent status: Secondary | ICD-10-CM | POA: Diagnosis not present

## 2021-09-07 LAB — RENAL FUNCTION PANEL
Albumin: 3.3 g/dL — ABNORMAL LOW (ref 3.5–5.0)
Anion gap: 9 (ref 5–15)
BUN: 29 mg/dL — ABNORMAL HIGH (ref 8–23)
CO2: 25 mmol/L (ref 22–32)
Calcium: 9.1 mg/dL (ref 8.9–10.3)
Chloride: 100 mmol/L (ref 98–111)
Creatinine, Ser: 3.32 mg/dL — ABNORMAL HIGH (ref 0.44–1.00)
GFR, Estimated: 13 mL/min — ABNORMAL LOW (ref >60)
Glucose, Bld: 171 mg/dL — ABNORMAL HIGH (ref 70–99)
Phosphorus: 3.4 mg/dL (ref 2.5–4.6)
Potassium: 4.3 mmol/L (ref 3.5–5.1)
Sodium: 134 mmol/L — ABNORMAL LOW (ref 135–145)

## 2021-09-07 LAB — CBC
HCT: 28.7 % — ABNORMAL LOW (ref 36.0–46.0)
Hemoglobin: 8.8 g/dL — ABNORMAL LOW (ref 12.0–15.0)
MCH: 33.7 pg (ref 26.0–34.0)
MCHC: 30.7 g/dL (ref 30.0–36.0)
MCV: 110 fL — ABNORMAL HIGH (ref 80.0–100.0)
Platelets: 78 10*3/uL — ABNORMAL LOW (ref 150–400)
RBC: 2.61 MIL/uL — ABNORMAL LOW (ref 3.87–5.11)
RDW: 18 % — ABNORMAL HIGH (ref 11.5–15.5)
WBC: 5 10*3/uL (ref 4.0–10.5)
nRBC: 1.2 % — ABNORMAL HIGH (ref 0.0–0.2)

## 2021-09-07 LAB — GLUCOSE, CAPILLARY
Glucose-Capillary: 126 mg/dL — ABNORMAL HIGH (ref 70–99)
Glucose-Capillary: 98 mg/dL (ref 70–99)
Glucose-Capillary: 111 mg/dL — ABNORMAL HIGH (ref 70–99)
Glucose-Capillary: 265 mg/dL — ABNORMAL HIGH (ref 70–99)

## 2021-09-07 LAB — OCCULT BLOOD X 1 CARD TO LAB, STOOL: Fecal Occult Bld: NEGATIVE

## 2021-09-07 MED ORDER — PENTAFLUOROPROP-TETRAFLUOROETH EX AERO: 1.0000 | INHALATION_SPRAY | CUTANEOUS | Status: DC | PRN

## 2021-09-07 MED ORDER — ALBUTEROL SULFATE (2.5 MG/3ML) 0.083% IN NEBU
2.5000 mg | INHALATION_SOLUTION | Freq: Four times a day (QID) | RESPIRATORY_TRACT | Status: DC | PRN
  Administered 2021-09-07 (×2): 2.5 mg via RESPIRATORY_TRACT
  Filled 2021-09-07 (×2): qty 3

## 2021-09-07 MED ORDER — LIDOCAINE-PRILOCAINE 2.5-2.5 % EX CREA: 1.0000 | TOPICAL_CREAM | CUTANEOUS | Status: DC | PRN

## 2021-09-07 MED ORDER — CHLORHEXIDINE GLUCONATE CLOTH 2 % EX PADS
6.0000 | MEDICATED_PAD | Freq: Every day | CUTANEOUS | Status: DC
  Administered 2021-09-08: 6 via TOPICAL

## 2021-09-07 MED ORDER — ALPRAZOLAM 0.25 MG PO TABS
0.2500 mg | ORAL_TABLET | Freq: Three times a day (TID) | ORAL | Status: DC | PRN
Start: 2021-09-07 — End: 2021-09-08
  Administered 2021-09-07: 0.25 mg via ORAL
  Filled 2021-09-07: qty 1

## 2021-09-07 MED ORDER — LIDOCAINE HCL (PF) 1 % IJ SOLN: 5.0000 mL | INTRAMUSCULAR | Status: DC | PRN

## 2021-09-07 MED ORDER — PNEUMOCOCCAL 20-VAL CONJ VACC 0.5 ML IM SUSY
0.5000 mL | PREFILLED_SYRINGE | INTRAMUSCULAR | Status: AC
  Administered 2021-09-08: 0.5 mL via INTRAMUSCULAR
  Filled 2021-09-07: qty 0.5

## 2021-09-07 NOTE — Plan of Care (Signed)

## 2021-09-07 NOTE — Hospital Course (Addendum)
Jody [name] was admitted to the hospital with the working diagnosis of volume overload in the setting of ESRD.   81 yo female with the past medical history of atrial fibrillation, T2DM, COPD, heart failure and ESRD on HD who presented with dyspnea and hypoxemia. Reported edema for 3 to 4 days with worsening dyspnea, to the point where she was dyspneic with minimal efforts. On her initial physical examination her 02 saturation was 50 to 60's, placed on 15 L/min of supplemental 02, her blood pressure 108/81, RR 20. Lungs with no wheezing or rhonchi, heart with S1 and S2 present and rhythmic, abdomen not distended and positive lower extremity edema +++.   Na 136, K 4,3 Cl 100 bicarbonate 25, glucose 127 bun 45 cr 4,92  P 5.2  High sensitive troponin 44 and 44  Wbc 4,1 hgb 9,7 plt 104   Chest radiograph with right rotation, positive cardiomegaly, bilateral interstitial infiltrates with bilateral pleural effusions, more right than left.   Patient had emergent HD on admission and then the following day for 3consecutive days.  Midodrine added for blood pressure support.  Apparently low blood pressure has been limiting ultrafiltration as outpatient.

## 2021-09-07 NOTE — TOC Progression Note (Addendum)
Transition of Care [facility]) - Progression Note    Patient Details  [name] MRN: [id number] Date of Birth: [birth year]  Transition of Care [facility]) CM/SW [place], [place] Phone Number: 09/07/2021, 10:59 AM  Clinical Narrative:      CSW spoke with [facility]; pt is LTC with them. Liaison explained that if pt is Dcing today, they would need DC summary by 3pm; MD notified. TOC will follow to assist with DC to SNF.         Readmission Risk Interventions    08/15/2021   11:49 AM 06/07/2021    2:47 PM 06/01/2021   10:59 AM  Readmission Risk Prevention Plan  Transportation Screening Complete Complete Complete  Medication Review [employer])  Complete Complete  PCP or Specialist appointment within 3-5 days of discharge  Complete   HRI or Home Care Consult Complete Complete Complete  SW Recovery Care/Counseling Consult Complete Complete Complete  Palliative Care Screening Not Applicable Complete Not Applicable  Skilled Nursing Facility Complete Complete Complete

## 2021-09-07 NOTE — Progress Notes (Signed)
Pt receives out-pt HD at [facility] on TTS. Pt arrives at 9:45 for 10:00 chair time. Will assist as needed.   Melven Sartorius Renal Navigator [phone]

## 2021-09-07 NOTE — Consult Note (Signed)
   [facility] Inpatient Consult   09/07/2021  Laury Huizar Mar 16, 1940 [id number]  [place] [facility] [ACO] Patient: [employer]  *Readmission less than 30 days  Primary Care Provider:  Monico Blitz, MD, Independent Embedded Provider at [facility]   Reviewed for extreme high risk for unplanned readmissions.  Patient is long term care resident at [facility]:   Patient needs to be met as a resident as LTC, will alert [place] PAC of patient as LTC with extreme high risk/multiple admissions.  For questions or referrals, please contact:   Natividad Brood, RN BSN [facility] Liaison  [phone] business mobile phone Toll free office [phone]  Fax number: [phone] [email or web address]

## 2021-09-07 NOTE — Progress Notes (Signed)
[facility] Progress Note   Subjective:   Patient seen and examined at bedside in dialysis.  Tolerating treatment well so far. Reports "breathing attack" this AM.  Improved now.  Denies CP, SOB, abdominal pain, and n/v/d.  Admits to decreased appetite and likely weight loss over last few months.   Objective Vitals:   09/07/21 1030 09/07/21 1100 09/07/21 1130 09/07/21 1200  BP: 110/71 103/72 105/69 (!) 94/56  Pulse: 73 (!) 103 96 71  Resp: '16 18 12 14  '$ Temp:      TempSrc:      SpO2: 99% 100% 100% 96%  Weight:      Height:       Physical Exam General:WDWN female in NAD Heart:RRR, no mrg Lungs:breath sounds decreased. Nml WOB on 3L via [place] Abdomen:soft, NTND Extremities:1-2+ LE edema Dialysis Access: RU AVF in use  Filed Weights   09/06/21 1645 09/07/21 0442 09/07/21 0926  Weight: 84.7 kg 84.6 kg 81 kg    Intake/Output Summary (Last 24 hours) at 09/07/2021 1232 Last data filed at 09/07/2021 0900 Gross per 24 hour  Intake 343.31 ml  Output 2500 ml  Net -2156.69 ml    Additional Objective Labs: Basic Metabolic Panel: Recent Labs  Lab 09/01/21 1724 09/06/21 1453 09/06/21 1643 09/07/21 0809  NA 134* 136  --  134*  K 3.6 4.3  --  4.3  CL 97* 100  --  100  CO2 29 25  --  25  GLUCOSE 134* 127*  --  171*  BUN 20 45*  --  29*  CREATININE 2.69* 4.92*  --  3.32*  CALCIUM 9.1 9.6  --  9.1  PHOS  --   --  5.6* 3.4   Liver Function Tests: Recent Labs  Lab 09/06/21 1453 09/07/21 0809  AST 17  --   ALT 20  --   ALKPHOS 106  --   BILITOT 1.0  --   PROT 6.3*  --   ALBUMIN 3.6 3.3*   No results for input(s): "LIPASE", "AMYLASE" in the last 168 hours. CBC: Recent Labs  Lab 09/01/21 1724 09/06/21 1453 09/07/21 0540  WBC 4.3 4.1 5.0  NEUTROABS 3.0  --   --   HGB 9.7* 9.7* 8.8*  HCT 33.4* 33.4* 28.7*  MCV 111.7* 111.3* 110.0*  PLT 119* 104* 78*   Blood Culture    Component Value Date/Time   SDES BLOOD BLOOD LEFT HAND 05/31/2021 1315    SPECREQUEST  05/31/2021 1315    BOTTLES DRAWN AEROBIC ONLY Blood Culture results may not be optimal due to an inadequate volume of blood received in culture bottles   CULT  05/31/2021 1315    NO GROWTH 5 DAYS Performed at [facility], [address], [place], [place] [address]    REPTSTATUS 06/05/2021 FINAL 05/31/2021 1315    Cardiac Enzymes: No results for input(s): "CKTOTAL", "CKMB", "CKMBINDEX", "TROPONINI" in the last 168 hours. CBG: Recent Labs  Lab 09/06/21 1617 09/07/21 0626  GLUCAP 132* 126*   Iron Studies: No results for input(s): "IRON", "TIBC", "TRANSFERRIN", "FERRITIN" in the last 72 hours. Lab Results  Component Value Date   INR 1.6 (H) 05/31/2021   INR 1.0 12/27/2019   INR 1.2 10/27/2019   Studies/Results: DG CHEST PORT 1 VIEW  Result Date: 09/06/2021 CLINICAL DATA:  Shortness of breath, end-stage renal disease on hemodialysis EXAM: PORTABLE CHEST 1 VIEW COMPARISON:  09/05/2021 FINDINGS: Central pulmonary vascular congestion with hazy right lung opacities, favoring asymmetric edema with layering right  pleural effusion. Left lower lobe opacity, likely atelectasis. No pneumothorax. Cardiomegaly. IMPRESSION: Cardiomegaly with suspected asymmetric edema and layering right pleural effusion. Left lower lobe opacity, likely atelectasis. Electronically Signed   By: Julian Hy M.D.   On: 09/06/2021 22:45    Medications:  sodium chloride     ferric gluconate (FERRLECIT) IVPB 62.5 mg (09/06/21 1753)    apixaban  2.5 mg Oral BID   Chlorhexidine Gluconate Cloth  6 each Topical Q0600   Chlorhexidine Gluconate Cloth  6 each Topical Q0600   darbepoetin (ARANESP) injection - DIALYSIS  200 mcg Intravenous Q Tue-HD   insulin aspart  0-6 Units Subcutaneous TID WC   midodrine  10 mg Oral BID WC   [START ON 09/08/2021] pneumococcal 20-valent conjugate vaccine  0.5 mL Intramuscular Tomorrow-1000   sevelamer carbonate  800 mg Oral TID WC   sodium chloride flush  3 mL Intravenous  Q12H    Dialysis Orders: [facility] TTS   3h 92mn    84kg  400/500  2/2.5    RUA AVF 15ga   Heparin none (hx GIB) - mircera 200 mcg, due 7/25 - venofer 50 mg weekly   Assessment/ Plan: Vol overload/ acute on chronic resp failure w/ hypoxia - obvious vol^ on exam w/ bilat LE edema, no resp distress. CXR yesterday w/asymmetric edema, layering pleural effusions.  UF limited by hypotension. HD yesterday with net UF 2.3L, plan for additional 3-4L goal today.  ESRD - on HD TTS. Extra HD today for volume overload.  Plan for HD again tomorrow per regular schedule.  Atrial fib - on eliquis, metoprolol Hypotension - chronic issue, will ^midodrine to 10 mg bid Anemia esrd - Hb 9.7, due for esa today, will order darbe 200 mcg weekly while here on Tuesdays. Cont weekly IV Fe here.  MBD ckd - Ca in range, get alb/ phos as add-ons. Cont renvela as binder.  Nutrition - renal diet w/fluid restrictions.   LJen Mow PA-C [facility] 09/07/2021,12:32 PM  LOS: 1 day

## 2021-09-07 NOTE — Progress Notes (Signed)
Received patient in bed, alert and oriented. Informed consent signed and in chart.  Time tx completed:3.5  HD treatment completed. Patient tolerated well. Fistula/Graft/HD catheter without signs and symptoms of complications. Patient transported back to the room, alert and orient and in no acute distress. Report given to bedside RN  Total UF removed: 3500 mls  Medication given: None  Post HD VS:See chart  Post HD weight:  77.5 kg  Pt completed prescribed tx, tolerated well.

## 2021-09-07 NOTE — Progress Notes (Signed)
Progress Note   Patient: Jody [name] ION:[id number] DOB: 1941-01-28 DOA: 09/06/2021     1 DOS: the patient was seen and examined on 09/07/2021   Brief hospital course: Jody [name] was admitted to the hospital with the working diagnosis of volume overload in the setting of ESRD.   81 yo female with the past medical history of atrial fibrillation, T2DM, COPD, heart failure and ESRD on HD who presented with dyspnea and hypoxemia. Reported edema for 3 to 4 days with worsening dyspnea, to the point where she was dyspneic with minimal efforts. On her initial physical examination her 02 saturation was 50 to 60's, placed on 15 L/min of supplemental 02, her blood pressure 108/81, RR 20. Lungs with no wheezing or rhonchi, heart with S1 and S2 present and rhythmic, abdomen not distended and positive lower extremity edema +++.   Na 136, K 4,3 Cl 100 bicarbonate 25, glucose 127 bun 45 cr 4,92  P 5.2  High sensitive troponin 44 and 44  Wbc 4,1 hgb 9,7 plt 104   Chest radiograph with right rotation, positive cardiomegaly, bilateral interstitial infiltrates with bilateral pleural effusions, more right than left.   Patient had emergent HD on admission and then the following day for 2 consecutive days.  Midodrine added for blood pressure support.  Apparently low blood pressure has been limiting ultrafiltration as outpatient.   Assessment and Plan: * ESRD on dialysis [facility]) Patient had HD with ultrafiltration on 2 consecutive days with good toleration Blood pressure has improved with midodrine.   Today K is 4,3 and serum bicarbonate at 25.  Plan to continue hemodialysis with ultrafiltration tomorrow.  Acute hypoxemic respiratory failure due to pulmonary edema, bilateral pleural effusions due to volume overload.   Continue supplemental 02 per [place] and oxymetry monitoring. Today patient is on 3L/min with 02 saturation 98%   Chronic anemia of chronic renal disease combined with iron  deficiency Continue with IV iron and EPO.   Continue sevelamer for metabolic bone disease.   Acute on chronic diastolic CHF (congestive heart failure) (HCC) Echocardiogram from 04/23 with preserved LV systolic function, EF 24%.  Mild LVH. Reduced RV systolic function with RVSP 59,4. Severe dilatation of bilateral atriums. Severe TR.  Acute on chronic core pulmonale Possible primary pulmonary hypertension.   Continue midodrine for blood pressure support Plan to continue inpatient ultrafiltration, she continue to have volume overload with lower extremity edema.   Persistent atrial fibrillation (HCC) Continue atrial fibrillation rhtyhm, she is rate controlled.  Continue to hold on carvedilol and continue anticoagulation with apixaban.   Essential hypertension Continue to hold carvedilol and continue blood pressure support with daily midodrine.   Type 2 diabetes mellitus with nephropathy (HCC) A1C of 5.7 in 07/2021 Diet controlled Very sensitive SSI and accuchecks qac/hs         Subjective: patient with improvement in her dyspnea and edema but not back to baseline   Physical Exam: Vitals:   09/07/21 1200 09/07/21 1230 09/07/21 1300 09/07/21 1330  BP: (!) 94/56  104/62 99/70  Pulse: 71 (!) 39  75  Resp: '14 16 15 13  '$ Temp:    (!) 81.5 F (27.5 C)  TempSrc:    Oral  SpO2: 96% 100%  98%  Weight:      Height:       Neurology awake and alert ENT With mild pallor Cardiovascular with S1 and S2 present, irregularly irregular with  3/6 systolic murmur at the left sternal border.  Mild JVD Positive lower  extremity edema ++ pitting Respiratory with scattered rales Abdomen not distended  Data Reviewed:    Family Communication: I spoke with patient's nice  at the bedside, we talked in detail about patient's condition, plan of care and prognosis and all questions were addressed.   Disposition: Status is: Inpatient Remains inpatient appropriate because: volume overload    Planned Discharge Destination: Home  Author: Tawni Millers, MD 09/07/2021 3:13 PM  For on call review [email or web address].

## 2021-09-08 ENCOUNTER — Encounter (HOSPITAL_COMMUNITY): Payer: Self-pay | Admitting: Internal Medicine

## 2021-09-08 DIAGNOSIS — I10 Essential (primary) hypertension: Secondary | ICD-10-CM | POA: Diagnosis not present

## 2021-09-08 DIAGNOSIS — N186 End stage renal disease: Secondary | ICD-10-CM | POA: Diagnosis not present

## 2021-09-08 DIAGNOSIS — I5033 Acute on chronic diastolic (congestive) heart failure: Secondary | ICD-10-CM | POA: Diagnosis not present

## 2021-09-08 DIAGNOSIS — I4819 Other persistent atrial fibrillation: Secondary | ICD-10-CM | POA: Diagnosis not present

## 2021-09-08 LAB — GLUCOSE, CAPILLARY
Glucose-Capillary: 115 mg/dL — ABNORMAL HIGH (ref 70–99)
Glucose-Capillary: 116 mg/dL — ABNORMAL HIGH (ref 70–99)
Glucose-Capillary: 185 mg/dL — ABNORMAL HIGH (ref 70–99)

## 2021-09-08 LAB — BASIC METABOLIC PANEL
Anion gap: 8 (ref 5–15)
BUN: 29 mg/dL — ABNORMAL HIGH (ref 8–23)
CO2: 27 mmol/L (ref 22–32)
Calcium: 9.5 mg/dL (ref 8.9–10.3)
Chloride: 99 mmol/L (ref 98–111)
Creatinine, Ser: 3.42 mg/dL — ABNORMAL HIGH (ref 0.44–1.00)
GFR, Estimated: 13 mL/min — ABNORMAL LOW (ref >60)
Glucose, Bld: 109 mg/dL — ABNORMAL HIGH (ref 70–99)
Potassium: 4 mmol/L (ref 3.5–5.1)
Sodium: 134 mmol/L — ABNORMAL LOW (ref 135–145)

## 2021-09-08 LAB — HEPATITIS B SURFACE ANTIBODY, QUANTITATIVE: Hep B S AB Quant (Post): 209.1 m[IU]/mL (ref >9.9)

## 2021-09-08 LAB — CBC
HCT: 31.2 % — ABNORMAL LOW (ref 36.0–46.0)
Hemoglobin: 9.4 g/dL — ABNORMAL LOW (ref 12.0–15.0)
MCH: 32.6 pg (ref 26.0–34.0)
MCHC: 30.1 g/dL (ref 30.0–36.0)
MCV: 108.3 fL — ABNORMAL HIGH (ref 80.0–100.0)
Platelets: 119 10*3/uL — ABNORMAL LOW (ref 150–400)
RBC: 2.88 MIL/uL — ABNORMAL LOW (ref 3.87–5.11)
RDW: 18.6 % — ABNORMAL HIGH (ref 11.5–15.5)
WBC: 6 10*3/uL (ref 4.0–10.5)
nRBC: 0.7 % — ABNORMAL HIGH (ref 0.0–0.2)

## 2021-09-08 MED ORDER — MIDODRINE HCL 10 MG PO TABS: 10.0000 mg | ORAL_TABLET | Freq: Two times a day (BID) | ORAL | 0 refills | Status: AC

## 2021-09-08 NOTE — Discharge Summary (Addendum)
Physician Discharge Summary   Patient: Jody [name] MRN: [id number] DOB: May 18, 1940  Admit date:     09/06/2021  Discharge date: 09/08/21  Discharge Physician: Jimmy Picket Sumit Branham   PCP: Monico Blitz, MD   Recommendations at discharge:    Patient had consecutive hemodialysis with ultrafiltration with improvement in her volume status. Midodrine dose increased to 10 mg bid.  Follow up with nephrology as outpatient, next HD will be on 09/10/21.   Discharge Diagnoses: Principal Problem:   ESRD on dialysis [name]) Active Problems:   Acute on chronic diastolic CHF (congestive heart failure) (HCC)   Persistent atrial fibrillation ([place])   Essential hypertension   Type 2 diabetes mellitus with nephropathy ([place])   Renal failure  Resolved Problems:   * No resolved hospital problems. [facility] Course: Jody [name] was admitted to the hospital with the working diagnosis of volume overload in the setting of ESRD.   81 yo female with the past medical history of atrial fibrillation, T2DM, COPD, heart failure and ESRD on HD who presented with dyspnea and hypoxemia. Reported edema for 3 to 4 days with worsening dyspnea, to the point where she was dyspneic with minimal efforts. On her initial physical examination her 02 saturation was 50 to 60's, placed on 15 L/min of supplemental 02, her blood pressure 108/81, RR 20. Lungs with no wheezing or rhonchi, heart with S1 and S2 present and rhythmic, abdomen not distended and positive lower extremity edema +++.   Na 136, K 4,3 Cl 100 bicarbonate 25, glucose 127 bun 45 cr 4,92  P 5.2  High sensitive troponin 44 and 44  Wbc 4,1 hgb 9,7 plt 104   Chest radiograph with right rotation, positive cardiomegaly, bilateral interstitial infiltrates with bilateral pleural effusions, more right than left.   Patient had emergent HD on admission and then the following day for 3consecutive days.  Midodrine added for blood pressure support.  Apparently low  blood pressure has been limiting ultrafiltration as outpatient.   Assessment and Plan: * ESRD on dialysis [facility]) Patient had HD with ultrafiltration on 3 consecutive days with good toleration Blood pressure has improved with midodrine.   Improving volume status.  Patient will be discharge back to SNF to day and continue renal replacement therapy in 48 hrs per her usual schedule.   Acute hypoxemic respiratory failure due to pulmonary edema, bilateral pleural effusions due to volume overload.   Dyspnea has improved, oxygen requirements are back to baseline. 02 saturation today is 100% on 2 L/min per [place].   Chronic anemia of chronic renal disease combined with iron deficiency Patient received IV iron and EPO.   Continue sevelamer for metabolic bone disease.   Acute on chronic diastolic CHF (congestive heart failure) (HCC) Echocardiogram from 04/23 with preserved LV systolic function, EF 62%.  Mild LVH. Reduced RV systolic function with RVSP 59,4. Severe dilatation of bilateral atriums. Severe TR.  Acute on chronic core pulmonale Possible primary pulmonary hypertension.   Continue midodrine for blood pressure support Continue ultrafiltration as outpatient as tolerated.   Persistent atrial fibrillation (HCC) Continue atrial fibrillation rhtyhm, she is rate controlled.  Holding on carvedilol due to risk of hypotension. Anticoagulation with apixaban.   Essential hypertension Blood pressure support with midodrine.   Type 2 diabetes mellitus with nephropathy (HCC) A1C of 5.7 in 07/2021 Diet controlled         Consultants: nephrology  Procedures performed: none   Disposition: Home Diet recommendation:  Cardiac diet DISCHARGE MEDICATION: Allergies as of  09/08/2021       Reactions   Codeine Nausea And Vomiting        Medication List     STOP taking these medications    metoprolol tartrate 25 MG tablet Commonly known as: LOPRESSOR       TAKE these medications     acetaminophen 325 MG tablet Commonly known as: TYLENOL Take 325 mg by mouth 3 (three) times daily.   albuterol (2.5 MG/3ML) 0.083% nebulizer solution Commonly known as: PROVENTIL Take 2.5 mg by nebulization every 6 (six) hours as needed for wheezing or shortness of breath. What changed: Another medication with the same name was changed. Make sure you understand how and when to take each.   albuterol 108 (90 Base) MCG/ACT inhaler Commonly known as: VENTOLIN HFA Inhale 2 puffs into the lungs every 6 (six) hours as needed for wheezing or shortness of breath. Reported on 03/01/2015 What changed: how much to take   apixaban 2.5 MG Tabs tablet Commonly known as: ELIQUIS Take 2.5 mg by mouth 2 (two) times daily.   atorvastatin 40 MG tablet Commonly known as: LIPITOR Take 1 tablet by mouth at bedtime.   dextromethorphan-guaiFENesin 30-600 MG 12hr tablet Commonly known as: MUCINEX DM Take 1 tablet by mouth 2 (two) times daily.   feeding supplement (NEPRO CARB STEADY) Liqd Take 237 mLs by mouth 2 (two) times daily between meals.   ferrous sulfate 325 (65 FE) MG tablet Take 325 mg by mouth 2 (two) times daily with a meal.   folic acid 793 MCG tablet Commonly known as: FOLVITE Take 800 mcg by mouth daily.   gabapentin 300 MG capsule Commonly known as: NEURONTIN Take 300 mg by mouth daily.   midodrine 10 MG tablet Commonly known as: PROAMATINE Take 1 tablet (10 mg total) by mouth 2 (two) times daily with a meal. What changed: when to take this   PreserVision AREDS Tabs Take 1 tablet by mouth at bedtime.   sevelamer carbonate 800 MG tablet Commonly known as: RENVELA Take 800 mg by mouth 3 (three) times daily with meals.        Follow-up Information     Monico Blitz, MD. Go on 09/14/2021.   Specialty: Internal Medicine Why: '@8'$ :30am Contact information: [place] [address] [phone]         Minus Breeding, MD .   Specialty: Cardiology Contact  information: [place] [address] [phone]                Discharge Exam: Filed Weights   09/07/21 0926 09/08/21 0318 09/08/21 1214  Weight: 81 kg 80.4 kg 75.5 kg   BP (!) 103/59   Pulse (!) 114   Temp 97.9 F (36.6 C) (Oral)   Resp 12   Ht '5\' 2"'$  (1.575 m)   Wt 75.5 kg   SpO2 100%   BMI 30.44 kg/m   Patient with improvement in dyspnea.  Neurology awake and alert ENT with no pallor Cardiovascular with S1 and S2 present and rhythmic Respiratory with no rales Abdomen soft and non tender No lower extremity edema   Condition at discharge: stable  The results of significant diagnostics from this hospitalization (including imaging, microbiology, ancillary and laboratory) are listed below for reference.   Imaging Studies: DG CHEST PORT 1 VIEW  Result Date: 09/06/2021 CLINICAL DATA:  Shortness of breath, end-stage renal disease on hemodialysis EXAM: PORTABLE CHEST 1 VIEW COMPARISON:  09/05/2021 FINDINGS: Central pulmonary vascular congestion with  hazy right lung opacities, favoring asymmetric edema with layering right pleural effusion. Left lower lobe opacity, likely atelectasis. No pneumothorax. Cardiomegaly. IMPRESSION: Cardiomegaly with suspected asymmetric edema and layering right pleural effusion. Left lower lobe opacity, likely atelectasis. Electronically Signed   By: Julian Hy M.D.   On: 09/06/2021 22:45   DG Chest Port 1 View  Result Date: 09/01/2021 CLINICAL DATA:  Shortness of breath.  Respiratory distress. EXAM: PORTABLE CHEST 1 VIEW COMPARISON:  08/13/2021 FINDINGS: Low lung volumes. Cardiomegaly is similar. Unchanged mediastinal contours with aortic atherosclerosis. Improvement in pulmonary edema and pleural effusions from prior exam. There is persistent bibasilar atelectasis and probable small volume pleural fluid. No pneumothorax. IMPRESSION: Improvement in pulmonary edema and pleural effusions from prior exam. Persistent bibasilar  atelectasis and probable small pleural effusions. Electronically Signed   By: Keith Rake M.D.   On: 09/01/2021 18:15   VAS [place] LOWER EXTREMITY ARTERIAL DUPLEX  Result Date: 08/14/2021 LOWER EXTREMITY ARTERIAL DUPLEX STUDY Patient Name:  MARYCLAIRE STOECKER  Date of Exam:   08/14/2021 Medical Rec #: [id number]        Accession #:    [id number] Date of Birth: 03/27/1940        Patient Gender: F Patient Age:   55 years Exam [facility]  [facility] Procedure:      VAS [place] LOWER EXTREMITY ARTERIAL DUPLEX Referring Phys: RIPUDEEP RAI --------------------------------------------------------------------------------  Indications: Peripheral artery disease. High Risk Factors: Hypertension, hyperlipidemia, Diabetes, past history of                    smoking, prior CVA. Other Factors: Heart failure, ESRD on dialysis.  Vascular Interventions: Left great toe amputation 04/08/21. SFA                         drug-coated balloon angioplasty 04/06/21. Current ABI:            Bilateral: non compressible. Right TBI 0.61 Limitations: Arrythmia, massive edema. Comparison Study: No prior LEA on file Performing Technologist: Sharion Dove RVS  Examination Guidelines: A complete evaluation includes B-mode imaging, spectral Doppler, color Doppler, and power Doppler as needed of all accessible portions of each vessel. Bilateral testing is considered an integral part of a complete examination. Limited examinations for reoccurring indications may be performed as noted.  +-----------+--------+-----+--------+------------------------------+-----------+ LEFT       PSV cm/sRatioStenosisWaveform                      Comments    +-----------+--------+-----+--------+------------------------------+-----------+ CFA Prox   88                   monophasic                                +-----------+--------+-----+--------+------------------------------+-----------+ DFA        39                   monophasic                                 +-----------+--------+-----+--------+------------------------------+-----------+ SFA Prox   64                   monophasic                                +-----------+--------+-----+--------+------------------------------+-----------+  SFA Mid    68                   monophasic                    collateral? +-----------+--------+-----+--------+------------------------------+-----------+ SFA Distal 15                   monophasic                                +-----------+--------+-----+--------+------------------------------+-----------+ POP Prox   62                   monophasic                                +-----------+--------+-----+--------+------------------------------+-----------+ POP Distal 45                   monophasic                                +-----------+--------+-----+--------+------------------------------+-----------+ ATA Prox   52                   monophasic                                +-----------+--------+-----+--------+------------------------------+-----------+ ATA Mid    28                   monophasic                                +-----------+--------+-----+--------+------------------------------+-----------+ ATA Distal 61                   monophasic                                +-----------+--------+-----+--------+------------------------------+-----------+ PTA Prox                occluded                                          +-----------+--------+-----+--------+------------------------------+-----------+ PTA Mid                 occluded                                          +-----------+--------+-----+--------+------------------------------+-----------+ PTA Distal              occluded                                          +-----------+--------+-----+--------+------------------------------+-----------+ PERO Prox                       not visualized secondary to  edema/postioning                          +-----------+--------+-----+--------+------------------------------+-----------+ PERO Mid                        not visualized secondary to                                               edema/postioning                          +-----------+--------+-----+--------+------------------------------+-----------+ PERO Distal                     not visualized secondary to                                               edema/postioning                          +-----------+--------+-----+--------+------------------------------+-----------+  Summary: Left: Monophasic waveforms are noted throughout this technically difficult/limited study. No areas of stenosis noted in visualized arteries.  See table(s) above for measurements and observations. Electronically signed by Jamelle Haring on 08/14/2021 at 12:23:25 PM.    Final    VAS [place] ABI WITH/WO TBI  Result Date: 08/14/2021  LOWER EXTREMITY DOPPLER STUDY Patient Name:  ONDA KATTNER  Date of Exam:   08/14/2021 Medical Rec #: [id number]        Accession #:    [id number] Date of Birth: [birth year]        Patient Gender: F Patient Age:   35 years Exam Location:  [facility] Procedure:      VAS [place] ABI WITH/WO TBI Referring Phys: RIPUDEEP RAI --------------------------------------------------------------------------------  Indications: Peripheral artery disease. High Risk Factors: Hypertension, Diabetes, past history of smoking. Other Factors: Heart failure, ESRD on dialysis.  Vascular Interventions: Left great toe amputation 04/08/21. SFA drug-coated                         balloon angioplasty 04/06/21. Limitations: Today's exam was limited due to Arrythmia. Restricted right arm              secondary to dialysis access. Comparison Study: Prior ABI done 05/11/21 Performing Technologist: Sharion Dove RVS  Examination Guidelines: A complete evaluation includes at  minimum, Doppler waveform signals and systolic blood pressure reading at the level of bilateral brachial, anterior tibial, and posterior tibial arteries, when vessel segments are accessible. Bilateral testing is considered an integral part of a complete examination. Photoelectric Plethysmograph (PPG) waveforms and toe systolic pressure readings are included as required and additional duplex testing as needed. Limited examinations for reoccurring indications may be performed as noted.  ABI Findings: +---------+------------------+-----+----------+----------+ Right    Rt Pressure (mmHg)IndexWaveform  Comment    +---------+------------------+-----+----------+----------+ Brachial                                  restricted +---------+------------------+-----+----------+----------+ ATA      254  1.81 monophasic           +---------+------------------+-----+----------+----------+ DP       254               1.81 monophasic           +---------+------------------+-----+----------+----------+ Great Toe86                0.61                      +---------+------------------+-----+----------+----------+ +---------+------------------+-----+-----------+-------------------------------+ Left     Lt Pressure (mmHg)IndexWaveform   Comment                         +---------+------------------+-----+-----------+-------------------------------+ Brachial 140                    multiphasic                                +---------+------------------+-----+-----------+-------------------------------+ PTA                             absent                                     +---------+------------------+-----+-----------+-------------------------------+ PERO                                       unable to attain secondary to                                              postioning and edema             +---------+------------------+-----+-----------+-------------------------------+ DP       254               1.81                                            +---------+------------------+-----+-----------+-------------------------------+ Great Toe                                  amputation                      +---------+------------------+-----+-----------+-------------------------------+ +-------+----------------+-----------+----------------+------------+ ABI/TBIToday's ABI     Today's TBIPrevious ABI    Previous TBI +-------+----------------+-----------+----------------+------------+ Right  non compressible0.61       non compressible0.61         +-------+----------------+-----------+----------------+------------+ Left   non compressibleamputation non compressibleamputation   +-------+----------------+-----------+----------------+------------+ Bilateral ABIs appear essentially unchanged compared to prior study on 05/11/21. Right TBIs appear essentially unchanged compared to prior study on 05/11/21.  Summary: Right: Resting right ankle-brachial index indicates noncompressible right lower extremity arteries. The right toe-brachial index is abnormal. Left: Resting left ankle-brachial index indicates noncompressible left lower extremity arteries. *See table(s) above for measurements and observations.  Electronically signed by Jamelle Haring on 08/14/2021 at 12:23:06 PM.    Final  DG CHEST PORT 1 VIEW  Result Date: 08/13/2021 CLINICAL DATA:  Respiratory distress EXAM: PORTABLE CHEST 1 VIEW COMPARISON:  08/10/2021 FINDINGS: Cardiac shadow is enlarged but stable. Aortic calcifications are again seen. Vascular congestion with small pleural effusions right greater than left is noted. Basilar atelectasis is seen as well. The overall appearance is slightly progressed when compared with the prior exam. IMPRESSION: Slight worsening in CHF with effusions and basilar atelectasis. Electronically  Signed   By: Inez Catalina M.D.   On: 08/13/2021 22:47   DG Foot Complete Left  Result Date: 08/12/2021 Please see detailed radiograph report in office note.  DG CHEST PORT 1 VIEW  Result Date: 08/10/2021 CLINICAL DATA:  End-stage renal disease, shortness of breath EXAM: PORTABLE CHEST 1 VIEW COMPARISON:  Portable exam 1218 hours compared to 08/09/2021 FINDINGS: Enlargement of cardiac silhouette with pulmonary vascular congestion. Atherosclerotic calcification aorta. Decreased lung volumes with bibasilar atelectasis and probable mild pulmonary edema. No definite pleural effusion or pneumothorax. Bones demineralized. IMPRESSION: Enlargement of cardiac silhouette with pulmonary vascular congestion and minimal pulmonary edema. Bibasilar atelectasis. Aortic Atherosclerosis (ICD10-I70.0). Electronically Signed   By: Lavonia Dana M.D.   On: 08/10/2021 13:03    Microbiology: Results for orders placed or performed during the hospital encounter of 09/01/21  Resp Panel by RT-PCR (Flu A&B, Covid) Anterior Nasal Swab     Status: None   Collection Time: 09/01/21  5:54 PM   Specimen: Anterior Nasal Swab  Result Value Ref Range Status   SARS Coronavirus 2 by RT PCR NEGATIVE NEGATIVE Final    Comment: (NOTE) SARS-CoV-2 target nucleic acids are NOT DETECTED.  The SARS-CoV-2 RNA is generally detectable in upper respiratory specimens during the acute phase of infection. The lowest concentration of SARS-CoV-2 viral copies this assay can detect is 138 copies/mL. A negative result does not preclude SARS-Cov-2 infection and should not be used as the sole basis for treatment or other patient management decisions. A negative result may occur with  improper specimen collection/handling, submission of specimen other than nasopharyngeal swab, presence of viral mutation(s) within the areas targeted by this assay, and inadequate number of viral copies(<138 copies/mL). A negative result must be combined with clinical  observations, patient history, and epidemiological information. The expected result is Negative.  Fact Sheet for Patients:  EntrepreneurPulse.com.au  Fact Sheet for Healthcare Providers:  IncredibleEmployment.be  This test is no t yet approved or cleared by the [place] FDA and  has been authorized for detection and/or diagnosis of SARS-CoV-2 by FDA under an Emergency Use Authorization (EUA). This EUA will remain  in effect (meaning this test can be used) for the duration of the COVID-19 declaration under Section 564(b)(1) of the Act, 21 U.S.C.section 360bbb-3(b)(1), unless the authorization is terminated  or revoked sooner.       Influenza A by PCR NEGATIVE NEGATIVE Final   Influenza B by PCR NEGATIVE NEGATIVE Final    Comment: (NOTE) The Xpert Xpress SARS-CoV-2/FLU/RSV plus assay is intended as an aid in the diagnosis of influenza from Nasopharyngeal swab specimens and should not be used as a sole basis for treatment. Nasal washings and aspirates are unacceptable for Xpert Xpress SARS-CoV-2/FLU/RSV testing.  Fact Sheet for Patients: EntrepreneurPulse.com.au  Fact Sheet for Healthcare Providers: IncredibleEmployment.be  This test is not yet approved or cleared by the [place] FDA and has been authorized for detection and/or diagnosis of SARS-CoV-2 by FDA under an Emergency Use Authorization (EUA). This EUA will remain in effect (meaning this test can  be used) for the duration of the COVID-19 declaration under Section 564(b)(1) of the Act, 21 U.S.C. section 360bbb-3(b)(1), unless the authorization is terminated or revoked.  Performed at [facility], [address] [place], [place] [address]     Labs: CBC: Recent Labs  Lab 09/01/21 1724 09/06/21 1453 09/07/21 0540 09/08/21 0831  WBC 4.3 4.1 5.0 6.0  NEUTROABS 3.0  --   --   --   HGB 9.7* 9.7* 8.8* 9.4*  HCT 33.4* 33.4* 28.7* 31.2*   MCV 111.7* 111.3* 110.0* 108.3*  PLT 119* 104* 78* 353*   Basic Metabolic Panel: Recent Labs  Lab 09/01/21 1724 09/06/21 1453 09/06/21 1643 09/07/21 0809 09/08/21 0206  NA 134* 136  --  134* 134*  K 3.6 4.3  --  4.3 4.0  CL 97* 100  --  100 99  CO2 29 25  --  25 27  GLUCOSE 134* 127*  --  171* 109*  BUN 20 45*  --  29* 29*  CREATININE 2.69* 4.92*  --  3.32* 3.42*  CALCIUM 9.1 9.6  --  9.1 9.5  MG  --  2.5*  --   --   --   PHOS  --   --  5.6* 3.4  --    Liver Function Tests: Recent Labs  Lab 09/06/21 1453 09/07/21 0809  AST 17  --   ALT 20  --   ALKPHOS 106  --   BILITOT 1.0  --   PROT 6.3*  --   ALBUMIN 3.6 3.3*   CBG: Recent Labs  Lab 09/07/21 1423 09/07/21 1617 09/07/21 2106 09/08/21 0557 09/08/21 1351  GLUCAP 98 111* 265* 115* 116*    Discharge time spent: greater than 30 minutes.  Signed: Tawni Millers, MD [facility] 09/08/2021

## 2021-09-08 NOTE — Progress Notes (Addendum)
Received call from [facility] with request that pt's H and P be faxed for continuation of care at d/c. H and P with renal notes faxed to clinic today. Will assist as needed.   Melven Sartorius Renal Navigator [phone]  Addendum at 3:03 pm: Pt to d/c to snf today. Contacted [facility] [employer] and spoke to [place]. Clinic advised of pt's d/c today and that pt will resume care on Saturday. D/C summary and last renal note faxed to clinic for continuation of care.

## 2021-09-08 NOTE — Progress Notes (Addendum)
Jody [name] Progress Note   Subjective: Seen on HD. Very pleasant, denies SOB. Still with volume excess by exam. Continue lowering volume.     Objective Vitals:   09/08/21 0318 09/08/21 0325 09/08/21 0818 09/08/21 0833  BP: 135/81  128/76 114/72  Pulse: 75 87    Resp: 16 (!) '21 20 20  '$ Temp: 98.2 F (36.8 C)  98.4 F (36.9 C)   TempSrc: Oral  Oral   SpO2: 100% 99% 100%   Weight: 80.4 kg     Height:       Physical Exam General: Pleasant very elderly female in NAD Heart: S1,S2 irreg, 2/6 systolic M. AFib on monitor rate controlled.  Lungs: CTAB.  Abdomen: NABS, NT Extremities: 1+ Pitting edema pretib area, back of calves.  Dialysis Access: R AVF cannulated.     Additional Objective Labs: Basic Metabolic Panel: Recent Labs  Lab 09/06/21 1453 09/06/21 1643 09/07/21 0809 09/08/21 0206  NA 136  --  134* 134*  K 4.3  --  4.3 4.0  CL 100  --  100 99  CO2 25  --  25 27  GLUCOSE 127*  --  171* 109*  BUN 45*  --  29* 29*  CREATININE 4.92*  --  3.32* 3.42*  CALCIUM 9.6  --  9.1 9.5  PHOS  --  5.6* 3.4  --    Liver Function Tests: Recent Labs  Lab 09/06/21 1453 09/07/21 0809  AST 17  --   ALT 20  --   ALKPHOS 106  --   BILITOT 1.0  --   PROT 6.3*  --   ALBUMIN 3.6 3.3*   No results for input(s): "LIPASE", "AMYLASE" in the last 168 hours. CBC: Recent Labs  Lab 09/01/21 1724 09/06/21 1453 09/07/21 0540 09/08/21 0831  WBC 4.3 4.1 5.0 6.0  NEUTROABS 3.0  --   --   --   HGB 9.7* 9.7* 8.8* 9.4*  HCT 33.4* 33.4* 28.7* 31.2*  MCV 111.7* 111.3* 110.0* 108.3*  PLT 119* 104* 78* 119*   Blood Culture    Component Value Date/Time   SDES BLOOD BLOOD LEFT HAND 05/31/2021 1315   SPECREQUEST  05/31/2021 1315    BOTTLES DRAWN AEROBIC ONLY Blood Culture results may not be optimal due to an inadequate volume of blood received in culture bottles   CULT  05/31/2021 1315    NO GROWTH 5 DAYS Performed at [facility], [address], [place], [place]  [address]    REPTSTATUS 06/05/2021 FINAL 05/31/2021 1315    Cardiac Enzymes: No results for input(s): "CKTOTAL", "CKMB", "CKMBINDEX", "TROPONINI" in the last 168 hours. CBG: Recent Labs  Lab 09/07/21 0626 09/07/21 1423 09/07/21 1617 09/07/21 2106 09/08/21 0557  GLUCAP 126* 98 111* 265* 115*   Iron Studies: No results for input(s): "IRON", "TIBC", "TRANSFERRIN", "FERRITIN" in the last 72 hours. '@lablastinr3'$ @ Studies/Results: DG CHEST PORT 1 VIEW  Result Date: 09/06/2021 CLINICAL DATA:  Shortness of breath, end-stage renal disease on hemodialysis EXAM: PORTABLE CHEST 1 VIEW COMPARISON:  09/05/2021 FINDINGS: Central pulmonary vascular congestion with hazy right lung opacities, favoring asymmetric edema with layering right pleural effusion. Left lower lobe opacity, likely atelectasis. No pneumothorax. Cardiomegaly. IMPRESSION: Cardiomegaly with suspected asymmetric edema and layering right pleural effusion. Left lower lobe opacity, likely atelectasis. Electronically Signed   By: Julian Hy M.D.   On: 09/06/2021 22:45   Medications:  sodium chloride     ferric gluconate (FERRLECIT) IVPB 62.5 mg (09/06/21 1753)  apixaban  2.5 mg Oral BID   Chlorhexidine Gluconate Cloth  6 each Topical Q0600   darbepoetin (ARANESP) injection - DIALYSIS  200 mcg Intravenous Q Tue-HD   insulin aspart  0-6 Units Subcutaneous TID WC   midodrine  10 mg Oral BID WC   pneumococcal 20-valent conjugate vaccine  0.5 mL Intramuscular Tomorrow-1000   sevelamer carbonate  800 mg Oral TID WC   sodium chloride flush  3 mL Intravenous Q12H    Dialysis Orders: [facility]   3h 2mn    84kg  400/500  2/2.5    RUA AVF 15ga     - Heparin none (hx GIB) - mircera 200 mcg, due 7/25 - venofer 50 mg weekly   Assessment/ Plan: Vol overload/ acute on chronic resp failure w/ hypoxia - obvious vol^ on exam w/ bilat LE edema, no resp distress. CXR yesterday w/asymmetric edema, layering pleural effusions.  UF  limited by hypotension. Serial HD for volume removal.  ESRD - on HD TTS. HD today on schedule. Next HD 09/10/2021 Atrial fib - on eliquis, metoprolol Hypotension - chronic issue, will ^midodrine to 10 mg bid Anemia esrd - Hb 9.7, due for esa today, will order darbe 200 mcg weekly while here on Tuesdays. Cont weekly IV Fe here.  MBD ckd - Ca in range, get alb/ phos as add-ons. Cont renvela as binder.  Nutrition - renal diet w/fluid restrictions.   Jody H. Brown NP-C 09/08/2021, 9:36 AM  CDelanoKidney [name] [phone] Pt seen, examined and agree w A/P as above.  Jody Splinter MD 09/08/2021, 3:54 PM

## 2021-09-08 NOTE — TOC Transition Note (Signed)
Transition of Care [facility]) - CM/SW Discharge Note   Patient Details  [name] MRN: [id number] Date of Birth: Sep 28, 1940  Transition of Care [facility]) CM/SW Contact:  Bethann Berkshire, [place] Phone Number: 09/08/2021, 3:17 PM   Clinical Narrative:     Patient will DC to: [facility] Anticipated DC date: 09/08/21 Family notified: Left message with Niece, [name] Transport by: Corey Harold   Per MD patient ready for DC to [facility]. RN, patient, patient's family, and facility notified of DC. Discharge Summary and FL2 sent to facility. RN to call report prior to discharge [phone]). DC packet on chart. Ambulance transport requested for patient.   CSW will sign off for now as social work intervention is no longer needed. Please consult [place] again if new needs arise.   Final next level of care: [place] Barriers to Discharge: No Barriers Identified   Patient Goals and CMS Choice        Discharge Placement              Patient chooses bed at: [facility] ([place] Patient to be transferred to facility by: [place] Name of family member notified: [name] (Niece)   [phone] ([place]) Patient and family notified of of transfer: 09/08/21  Discharge Plan and Services                                     Social Determinants of Health (SDOH) Interventions     Readmission Risk Interventions    08/15/2021   11:49 AM 06/07/2021    2:47 PM 06/01/2021   10:59 AM  Readmission Risk Prevention Plan  Transportation Screening Complete Complete Complete  Medication Review (RN Care Manager)  Complete Complete  PCP or Specialist appointment within 3-5 days of discharge  Complete   HRI or Home Care Consult Complete Complete Complete  SW Recovery Care/Counseling Consult Complete Complete Complete  Palliative Care Screening Not Applicable Complete Not Applicable  Skilled Nursing Facility Complete Complete  Complete

## 2021-09-08 NOTE — NC FL2 (Signed)
[place] MEDICAID FL2 LEVEL OF CARE SCREENING TOOL     IDENTIFICATION  Patient [name] Birthdate: Sep 14, 1940 Sex: female Admission Date (Current Location): 09/06/2021  [facility] and [place] Number:  Herbalist and Address:  [facility] [place] [address] [place], [place] [address]      Provider Number: [id number]  Attending Physician Name and Address:  Tawni Millers,*  Relative Name and Phone Number:  [name] (Niece)   [phone] [facility])    Current Level of Care: Hospital Recommended Level of Care: [place] Prior Approval Number:    Date Approved/Denied:   PASRR Number:    Discharge Plan: SNF    Current Diagnoses: Patient Active Problem List   Diagnosis Date Noted   Renal failure 09/07/2021   Hypotension 08/10/2021   Class 1 obesity 07/26/2021   COPD (chronic obstructive pulmonary disease) ([place]) 07/25/2021   Chest pain 07/02/2021   Pericardial friction rub 06/03/2021   Acute on chronic respiratory failure with hypoxia and hypercapnia ([place]) 06/01/2021   Persistent atrial fibrillation ([place]) 06/01/2021   Thrombocytopenia ([place]) 05/31/2021   Acute hematogenous osteomyelitis of right foot ([place]) 05/23/2021   PAD (peripheral artery disease) ([place]) 05/23/2021   Leg pain 25/00/3704   Acute metabolic encephalopathy 88/89/1694   Sinus bradycardia 04/07/2021   Hypokalemia 04/05/2021   History of CVA (cerebrovascular accident) 04/04/2021   Acute osteomyelitis of left foot ([place]) 04/04/2021   Anemia of chronic kidney failure 04/04/2021   Hyponatremia 04/04/2021   Memory loss 04/04/2021   Pressure injury of skin 03/04/2021   Rectal bleeding 12/27/2019   NSVT (nonsustained ventricular tachycardia) ([place]) 10/27/2019   Megaloblastic anemia 10/26/2019   Chronic blood loss anemia 10/26/2017   Iron deficiency anemia 02/23/2017   Acute on chronic diastolic CHF (congestive heart failure) ([place]) 10/15/2016    Type 2 diabetes mellitus with nephropathy ([place]) 10/15/2016   ESRD on dialysis ([place]) 10/15/2016   NSTEMI (non-ST elevated myocardial infarction) ([place]) 10/03/2015   Anemia in chronic kidney disease, Stage IV 06/05/2015   TIA (transient ischemic attack) 09/02/2014   Gait difficulty 09/02/2014   Other specified transient cerebral ischemias    Stroke with cerebral ischemia (HCC)    Hemispheric carotid artery syndrome    Essential hypertension     Orientation RESPIRATION BLADDER Height & Weight     Self  O2 (2L [place]) Continent Weight: 166 lb 7.2 oz (75.5 kg) Height:  '5\' 2"'$  (157.5 cm)  BEHAVIORAL SYMPTOMS/MOOD NEUROLOGICAL BOWEL NUTRITION STATUS      Continent Diet (see d/c summary)  AMBULATORY STATUS COMMUNICATION OF NEEDS Skin   Extensive Assist Verbally PU Stage and Appropriate Care (pressure injury, coccyx, stage 2; incision left toe)                       Personal Care Assistance Level of Assistance  Bathing, Feeding, Dressing Bathing Assistance: Limited assistance Feeding assistance: Independent Dressing Assistance: Limited assistance     Functional Limitations Info  Sight, Hearing, Speech Sight Info: Adequate Hearing Info: Adequate Speech Info: Adequate    SPECIAL CARE FACTORS FREQUENCY                       Contractures Contractures Info: Not present    Additional Factors Info  Code Status, Allergies Code Status Info: full code Allergies Info: codeine           Current Medications (09/08/2021):  This is the current hospital active  medication list Current Facility-Administered Medications  Medication Dose Route Frequency Provider Last Rate Last Admin   0.9 %  sodium chloride infusion  250 mL Intravenous PRN Orma Flaming, MD       acetaminophen (TYLENOL) tablet 650 mg  650 mg Oral Q6H PRN Orma Flaming, MD   650 mg at 09/08/21 1052   Or   acetaminophen (TYLENOL) suppository 650 mg  650 mg Rectal Q6H PRN Orma Flaming, MD       albuterol  (PROVENTIL) (2.5 MG/3ML) 0.083% nebulizer solution 2.5 mg  2.5 mg Nebulization Q6H PRN Arrien, Jimmy Picket, MD   2.5 mg at 09/07/21 1751   ALPRAZolam (XANAX) tablet 0.25 mg  0.25 mg Oral TID PRN Tawni Millers, MD   0.25 mg at 09/07/21 1855   apixaban (ELIQUIS) tablet 2.5 mg  2.5 mg Oral BID Orma Flaming, MD   2.5 mg at 09/08/21 1401   Chlorhexidine Gluconate Cloth 2 % PADS 6 each  6 each Topical Q0600 Penninger, Ria Comment, PA   6 each at 09/08/21 0650   Darbepoetin Alfa (ARANESP) injection 200 mcg  200 mcg Intravenous Q Tue-HD Hammons, Kimberly B, RPH       ferric gluconate (FERRLECIT) 62.5 mg in sodium chloride 0.9 % 100 mL IVPB  62.5 mg Intravenous Q Tue-HD Hammons, Kimberly B, RPH 105 mL/hr at 09/06/21 1753 62.5 mg at 09/06/21 1753   insulin aspart (novoLOG) injection 0-6 Units  0-6 Units Subcutaneous TID WC Orma Flaming, MD       midodrine (PROAMATINE) tablet 10 mg  10 mg Oral BID WC Roney Jaffe, MD   10 mg at 09/08/21 5400   sevelamer carbonate (RENVELA) tablet 800 mg  800 mg Oral TID WC Roney Jaffe, MD   800 mg at 09/08/21 1401   sodium chloride flush (NS) 0.9 % injection 3 mL  3 mL Intravenous Q12H Orma Flaming, MD   3 mL at 09/07/21 0849   sodium chloride flush (NS) 0.9 % injection 3 mL  3 mL Intravenous PRN Orma Flaming, MD         Discharge Medications: Please see discharge summary for a list of discharge medications.  Relevant Imaging Results:  Relevant Lab Results:   Additional Information [id number],  Bethann Berkshire, LCSW

## 2021-09-08 NOTE — Progress Notes (Signed)
Report called to [facility], no belongings, awaiting transport by [facility].

## 2021-09-10 DIAGNOSIS — N250 Renal osteodystrophy: Secondary | ICD-10-CM | POA: Diagnosis not present

## 2021-09-10 DIAGNOSIS — E559 Vitamin D deficiency, unspecified: Secondary | ICD-10-CM | POA: Diagnosis not present

## 2021-09-10 DIAGNOSIS — N186 End stage renal disease: Secondary | ICD-10-CM | POA: Diagnosis not present

## 2021-09-10 DIAGNOSIS — N2581 Secondary hyperparathyroidism of renal origin: Secondary | ICD-10-CM | POA: Diagnosis not present

## 2021-09-10 DIAGNOSIS — Z992 Dependence on renal dialysis: Secondary | ICD-10-CM | POA: Diagnosis not present

## 2021-09-13 DIAGNOSIS — Z992 Dependence on renal dialysis: Secondary | ICD-10-CM | POA: Diagnosis not present

## 2021-09-13 DIAGNOSIS — N186 End stage renal disease: Secondary | ICD-10-CM | POA: Diagnosis not present

## 2021-09-13 DIAGNOSIS — N2581 Secondary hyperparathyroidism of renal origin: Secondary | ICD-10-CM | POA: Diagnosis not present

## 2021-09-15 DIAGNOSIS — N2581 Secondary hyperparathyroidism of renal origin: Secondary | ICD-10-CM | POA: Diagnosis not present

## 2021-09-15 DIAGNOSIS — N186 End stage renal disease: Secondary | ICD-10-CM | POA: Diagnosis not present

## 2021-09-15 DIAGNOSIS — Z992 Dependence on renal dialysis: Secondary | ICD-10-CM | POA: Diagnosis not present

## 2021-09-17 DIAGNOSIS — N186 End stage renal disease: Secondary | ICD-10-CM | POA: Diagnosis not present

## 2021-09-17 DIAGNOSIS — N2581 Secondary hyperparathyroidism of renal origin: Secondary | ICD-10-CM | POA: Diagnosis not present

## 2021-09-17 DIAGNOSIS — Z992 Dependence on renal dialysis: Secondary | ICD-10-CM | POA: Diagnosis not present

## 2021-09-20 DIAGNOSIS — N2581 Secondary hyperparathyroidism of renal origin: Secondary | ICD-10-CM | POA: Diagnosis not present

## 2021-09-20 DIAGNOSIS — N186 End stage renal disease: Secondary | ICD-10-CM | POA: Diagnosis not present

## 2021-09-20 DIAGNOSIS — Z992 Dependence on renal dialysis: Secondary | ICD-10-CM | POA: Diagnosis not present

## 2021-09-22 DIAGNOSIS — N186 End stage renal disease: Secondary | ICD-10-CM | POA: Diagnosis not present

## 2021-09-22 DIAGNOSIS — Z992 Dependence on renal dialysis: Secondary | ICD-10-CM | POA: Diagnosis not present

## 2021-09-22 DIAGNOSIS — N2581 Secondary hyperparathyroidism of renal origin: Secondary | ICD-10-CM | POA: Diagnosis not present

## 2021-09-24 DIAGNOSIS — Z992 Dependence on renal dialysis: Secondary | ICD-10-CM | POA: Diagnosis not present

## 2021-09-24 DIAGNOSIS — N186 End stage renal disease: Secondary | ICD-10-CM | POA: Diagnosis not present

## 2021-09-24 DIAGNOSIS — N2581 Secondary hyperparathyroidism of renal origin: Secondary | ICD-10-CM | POA: Diagnosis not present

## 2021-09-27 ENCOUNTER — Other Ambulatory Visit: Payer: Self-pay | Admitting: *Deleted

## 2021-09-27 DIAGNOSIS — M6281 Muscle weakness (generalized): Secondary | ICD-10-CM | POA: Diagnosis not present

## 2021-09-27 DIAGNOSIS — J9601 Acute respiratory failure with hypoxia: Secondary | ICD-10-CM | POA: Diagnosis not present

## 2021-09-27 DIAGNOSIS — R6889 Other general symptoms and signs: Secondary | ICD-10-CM | POA: Diagnosis not present

## 2021-09-27 DIAGNOSIS — I5032 Chronic diastolic (congestive) heart failure: Secondary | ICD-10-CM | POA: Diagnosis not present

## 2021-09-27 DIAGNOSIS — N186 End stage renal disease: Secondary | ICD-10-CM | POA: Diagnosis not present

## 2021-09-27 DIAGNOSIS — Z992 Dependence on renal dialysis: Secondary | ICD-10-CM | POA: Diagnosis not present

## 2021-09-27 DIAGNOSIS — N2581 Secondary hyperparathyroidism of renal origin: Secondary | ICD-10-CM | POA: Diagnosis not present

## 2021-09-27 NOTE — Patient Outreach (Signed)
  Care Coordination   Initial Visit Note   09/27/2021 [name] MRN: [id number] DOB: July 19, 1940  Jody [name] is a 81 y.o. year old female who sees Monico Blitz, MD for primary care. I  Spoke with [facility] SN[facility].  Confirmed that member is resident there, planning for long term.    SDOH assessments and interventions completed:  No     Care Coordination Interventions Activated:  No  Care Coordination Interventions:  No, not indicated   Follow up plan: No further intervention required.   Encounter Outcome:  Pt. Visit Completed   Valente David, RN, MSN, [facility] Care Coordinator [phone]

## 2021-09-29 DIAGNOSIS — N2581 Secondary hyperparathyroidism of renal origin: Secondary | ICD-10-CM | POA: Diagnosis not present

## 2021-09-29 DIAGNOSIS — Z992 Dependence on renal dialysis: Secondary | ICD-10-CM | POA: Diagnosis not present

## 2021-09-29 DIAGNOSIS — N186 End stage renal disease: Secondary | ICD-10-CM | POA: Diagnosis not present

## 2021-10-01 DIAGNOSIS — N186 End stage renal disease: Secondary | ICD-10-CM | POA: Diagnosis not present

## 2021-10-01 DIAGNOSIS — Z992 Dependence on renal dialysis: Secondary | ICD-10-CM | POA: Diagnosis not present

## 2021-10-01 DIAGNOSIS — N2581 Secondary hyperparathyroidism of renal origin: Secondary | ICD-10-CM | POA: Diagnosis not present

## 2021-10-04 DIAGNOSIS — N186 End stage renal disease: Secondary | ICD-10-CM | POA: Diagnosis not present

## 2021-10-04 DIAGNOSIS — Z992 Dependence on renal dialysis: Secondary | ICD-10-CM | POA: Diagnosis not present

## 2021-10-04 DIAGNOSIS — N2581 Secondary hyperparathyroidism of renal origin: Secondary | ICD-10-CM | POA: Diagnosis not present

## 2021-10-06 DIAGNOSIS — N2581 Secondary hyperparathyroidism of renal origin: Secondary | ICD-10-CM | POA: Diagnosis not present

## 2021-10-06 DIAGNOSIS — N186 End stage renal disease: Secondary | ICD-10-CM | POA: Diagnosis not present

## 2021-10-06 DIAGNOSIS — Z992 Dependence on renal dialysis: Secondary | ICD-10-CM | POA: Diagnosis not present

## 2021-10-08 DIAGNOSIS — N186 End stage renal disease: Secondary | ICD-10-CM | POA: Diagnosis not present

## 2021-10-08 DIAGNOSIS — N2581 Secondary hyperparathyroidism of renal origin: Secondary | ICD-10-CM | POA: Diagnosis not present

## 2021-10-08 DIAGNOSIS — Z992 Dependence on renal dialysis: Secondary | ICD-10-CM | POA: Diagnosis not present

## 2021-10-11 DIAGNOSIS — N2581 Secondary hyperparathyroidism of renal origin: Secondary | ICD-10-CM | POA: Diagnosis not present

## 2021-10-11 DIAGNOSIS — N186 End stage renal disease: Secondary | ICD-10-CM | POA: Diagnosis not present

## 2021-10-11 DIAGNOSIS — Z992 Dependence on renal dialysis: Secondary | ICD-10-CM | POA: Diagnosis not present

## 2021-10-13 DIAGNOSIS — N186 End stage renal disease: Secondary | ICD-10-CM | POA: Diagnosis not present

## 2021-10-13 DIAGNOSIS — N2581 Secondary hyperparathyroidism of renal origin: Secondary | ICD-10-CM | POA: Diagnosis not present

## 2021-10-13 DIAGNOSIS — Z992 Dependence on renal dialysis: Secondary | ICD-10-CM | POA: Diagnosis not present

## 2021-10-15 DIAGNOSIS — Z992 Dependence on renal dialysis: Secondary | ICD-10-CM | POA: Diagnosis not present

## 2021-10-15 DIAGNOSIS — N2581 Secondary hyperparathyroidism of renal origin: Secondary | ICD-10-CM | POA: Diagnosis not present

## 2021-10-15 DIAGNOSIS — N186 End stage renal disease: Secondary | ICD-10-CM | POA: Diagnosis not present

## 2021-10-18 DIAGNOSIS — Z992 Dependence on renal dialysis: Secondary | ICD-10-CM | POA: Diagnosis not present

## 2021-10-18 DIAGNOSIS — N186 End stage renal disease: Secondary | ICD-10-CM | POA: Diagnosis not present

## 2021-10-18 DIAGNOSIS — N2581 Secondary hyperparathyroidism of renal origin: Secondary | ICD-10-CM | POA: Diagnosis not present

## 2021-10-20 ENCOUNTER — Other Ambulatory Visit: Payer: Self-pay

## 2021-10-20 ENCOUNTER — Emergency Department (HOSPITAL_COMMUNITY): Payer: Medicare Other

## 2021-10-20 ENCOUNTER — Encounter (HOSPITAL_COMMUNITY): Payer: Self-pay | Admitting: Emergency Medicine

## 2021-10-20 ENCOUNTER — Inpatient Hospital Stay (HOSPITAL_COMMUNITY)
Admission: EM | Admit: 2021-10-20 | Discharge: 2021-10-24 | DRG: 291 | Disposition: A | Discharge status: Skilled Nursing Facility | Payer: Medicare Other | Attending: Internal Medicine | Admitting: Internal Medicine

## 2021-10-20 DIAGNOSIS — Z79899 Other long term (current) drug therapy: Secondary | ICD-10-CM

## 2021-10-20 DIAGNOSIS — D631 Anemia in chronic kidney disease: Secondary | ICD-10-CM | POA: Diagnosis not present

## 2021-10-20 DIAGNOSIS — J9621 Acute and chronic respiratory failure with hypoxia: Secondary | ICD-10-CM | POA: Diagnosis not present

## 2021-10-20 DIAGNOSIS — D696 Thrombocytopenia, unspecified: Secondary | ICD-10-CM | POA: Diagnosis present

## 2021-10-20 DIAGNOSIS — E876 Hypokalemia: Secondary | ICD-10-CM | POA: Diagnosis present

## 2021-10-20 DIAGNOSIS — E7800 Pure hypercholesterolemia, unspecified: Secondary | ICD-10-CM | POA: Diagnosis not present

## 2021-10-20 DIAGNOSIS — Z6832 Body mass index (BMI) 32.0-32.9, adult: Secondary | ICD-10-CM | POA: Diagnosis not present

## 2021-10-20 DIAGNOSIS — Z7189 Other specified counseling: Secondary | ICD-10-CM | POA: Diagnosis not present

## 2021-10-20 DIAGNOSIS — Z992 Dependence on renal dialysis: Secondary | ICD-10-CM

## 2021-10-20 DIAGNOSIS — N186 End stage renal disease: Secondary | ICD-10-CM | POA: Diagnosis not present

## 2021-10-20 DIAGNOSIS — I509 Heart failure, unspecified: Secondary | ICD-10-CM | POA: Diagnosis not present

## 2021-10-20 DIAGNOSIS — I5033 Acute on chronic diastolic (congestive) heart failure: Secondary | ICD-10-CM | POA: Diagnosis not present

## 2021-10-20 DIAGNOSIS — I10 Essential (primary) hypertension: Secondary | ICD-10-CM | POA: Diagnosis not present

## 2021-10-20 DIAGNOSIS — J9601 Acute respiratory failure with hypoxia: Secondary | ICD-10-CM | POA: Diagnosis not present

## 2021-10-20 DIAGNOSIS — I454 Nonspecific intraventricular block: Secondary | ICD-10-CM | POA: Diagnosis not present

## 2021-10-20 DIAGNOSIS — R0789 Other chest pain: Secondary | ICD-10-CM | POA: Diagnosis not present

## 2021-10-20 DIAGNOSIS — R739 Hyperglycemia, unspecified: Secondary | ICD-10-CM | POA: Diagnosis not present

## 2021-10-20 DIAGNOSIS — I4819 Other persistent atrial fibrillation: Secondary | ICD-10-CM | POA: Diagnosis not present

## 2021-10-20 DIAGNOSIS — Z8673 Personal history of transient ischemic attack (TIA), and cerebral infarction without residual deficits: Secondary | ICD-10-CM

## 2021-10-20 DIAGNOSIS — Z8249 Family history of ischemic heart disease and other diseases of the circulatory system: Secondary | ICD-10-CM | POA: Diagnosis not present

## 2021-10-20 DIAGNOSIS — Z803 Family history of malignant neoplasm of breast: Secondary | ICD-10-CM

## 2021-10-20 DIAGNOSIS — E669 Obesity, unspecified: Secondary | ICD-10-CM | POA: Diagnosis present

## 2021-10-20 DIAGNOSIS — J9622 Acute and chronic respiratory failure with hypercapnia: Secondary | ICD-10-CM | POA: Diagnosis present

## 2021-10-20 DIAGNOSIS — F419 Anxiety disorder, unspecified: Secondary | ICD-10-CM | POA: Diagnosis present

## 2021-10-20 DIAGNOSIS — E8779 Other fluid overload: Secondary | ICD-10-CM | POA: Diagnosis not present

## 2021-10-20 DIAGNOSIS — J410 Simple chronic bronchitis: Secondary | ICD-10-CM | POA: Diagnosis not present

## 2021-10-20 DIAGNOSIS — Z87891 Personal history of nicotine dependence: Secondary | ICD-10-CM | POA: Diagnosis not present

## 2021-10-20 DIAGNOSIS — Z7901 Long term (current) use of anticoagulants: Secondary | ICD-10-CM

## 2021-10-20 DIAGNOSIS — I132 Hypertensive heart and chronic kidney disease with heart failure and with stage 5 chronic kidney disease, or end stage renal disease: Secondary | ICD-10-CM | POA: Diagnosis not present

## 2021-10-20 DIAGNOSIS — I959 Hypotension, unspecified: Secondary | ICD-10-CM | POA: Diagnosis not present

## 2021-10-20 DIAGNOSIS — I5043 Acute on chronic combined systolic (congestive) and diastolic (congestive) heart failure: Secondary | ICD-10-CM

## 2021-10-20 DIAGNOSIS — I5031 Acute diastolic (congestive) heart failure: Secondary | ICD-10-CM | POA: Diagnosis not present

## 2021-10-20 DIAGNOSIS — Z20822 Contact with and (suspected) exposure to covid-19: Secondary | ICD-10-CM | POA: Diagnosis not present

## 2021-10-20 DIAGNOSIS — Z66 Do not resuscitate: Secondary | ICD-10-CM | POA: Diagnosis present

## 2021-10-20 DIAGNOSIS — R0689 Other abnormalities of breathing: Secondary | ICD-10-CM | POA: Diagnosis not present

## 2021-10-20 DIAGNOSIS — Z23 Encounter for immunization: Secondary | ICD-10-CM | POA: Diagnosis not present

## 2021-10-20 DIAGNOSIS — E1122 Type 2 diabetes mellitus with diabetic chronic kidney disease: Secondary | ICD-10-CM | POA: Diagnosis present

## 2021-10-20 DIAGNOSIS — J441 Chronic obstructive pulmonary disease with (acute) exacerbation: Secondary | ICD-10-CM | POA: Diagnosis present

## 2021-10-20 DIAGNOSIS — J449 Chronic obstructive pulmonary disease, unspecified: Secondary | ICD-10-CM

## 2021-10-20 DIAGNOSIS — R0602 Shortness of breath: Secondary | ICD-10-CM | POA: Diagnosis not present

## 2021-10-20 DIAGNOSIS — I110 Hypertensive heart disease with heart failure: Secondary | ICD-10-CM | POA: Diagnosis not present

## 2021-10-20 DIAGNOSIS — R0603 Acute respiratory distress: Secondary | ICD-10-CM | POA: Diagnosis not present

## 2021-10-20 DIAGNOSIS — E1121 Type 2 diabetes mellitus with diabetic nephropathy: Secondary | ICD-10-CM | POA: Diagnosis present

## 2021-10-20 DIAGNOSIS — N2581 Secondary hyperparathyroidism of renal origin: Secondary | ICD-10-CM | POA: Diagnosis not present

## 2021-10-20 DIAGNOSIS — R079 Chest pain, unspecified: Secondary | ICD-10-CM | POA: Diagnosis not present

## 2021-10-20 DIAGNOSIS — E66811 Obesity, class 1: Secondary | ICD-10-CM | POA: Diagnosis present

## 2021-10-20 LAB — CBC WITH DIFFERENTIAL/PLATELET
Abs Immature Granulocytes: 0.02 10*3/uL (ref 0.00–0.07)
Basophils Absolute: 0 10*3/uL (ref 0.0–0.1)
Basophils Relative: 1 %
Eosinophils Absolute: 0.2 10*3/uL (ref 0.0–0.5)
Eosinophils Relative: 3 %
HCT: 34.3 % — ABNORMAL LOW (ref 36.0–46.0)
Hemoglobin: 9.9 g/dL — ABNORMAL LOW (ref 12.0–15.0)
Immature Granulocytes: 0 %
Lymphocytes Relative: 10 %
Lymphs Abs: 0.5 10*3/uL — ABNORMAL LOW (ref 0.7–4.0)
MCH: 31.9 pg (ref 26.0–34.0)
MCHC: 28.9 g/dL — ABNORMAL LOW (ref 30.0–36.0)
MCV: 110.6 fL — ABNORMAL HIGH (ref 80.0–100.0)
Monocytes Absolute: 0.5 10*3/uL (ref 0.1–1.0)
Monocytes Relative: 9 %
Neutro Abs: 4.2 10*3/uL (ref 1.7–7.7)
Neutrophils Relative %: 77 %
Platelets: 160 10*3/uL (ref 150–400)
RBC: 3.1 MIL/uL — ABNORMAL LOW (ref 3.87–5.11)
RDW: 17.5 % — ABNORMAL HIGH (ref 11.5–15.5)
WBC: 5.4 10*3/uL (ref 4.0–10.5)
nRBC: 0 % (ref 0.0–0.2)

## 2021-10-20 LAB — RENAL FUNCTION PANEL
Albumin: 4 g/dL (ref 3.5–5.0)
Anion gap: 11 (ref 5–15)
BUN: 22 mg/dL (ref 8–23)
CO2: 28 mmol/L (ref 22–32)
Calcium: 9.2 mg/dL (ref 8.9–10.3)
Chloride: 96 mmol/L — ABNORMAL LOW (ref 98–111)
Creatinine, Ser: 2.23 mg/dL — ABNORMAL HIGH (ref 0.44–1.00)
GFR, Estimated: 21 mL/min — ABNORMAL LOW (ref >60)
Glucose, Bld: 115 mg/dL — ABNORMAL HIGH (ref 70–99)
Phosphorus: 3.1 mg/dL (ref 2.5–4.6)
Potassium: 3.4 mmol/L — ABNORMAL LOW (ref 3.5–5.1)
Sodium: 135 mmol/L (ref 135–145)

## 2021-10-20 LAB — RESP PANEL BY RT-PCR (FLU A&B, COVID) ARPGX2
Influenza A by PCR: NEGATIVE
Influenza B by PCR: NEGATIVE
SARS Coronavirus 2 by RT PCR: NEGATIVE

## 2021-10-20 LAB — CBC
HCT: 34.7 % — ABNORMAL LOW (ref 36.0–46.0)
Hemoglobin: 10.1 g/dL — ABNORMAL LOW (ref 12.0–15.0)
MCH: 32 pg (ref 26.0–34.0)
MCHC: 29.1 g/dL — ABNORMAL LOW (ref 30.0–36.0)
MCV: 109.8 fL — ABNORMAL HIGH (ref 80.0–100.0)
Platelets: 139 10*3/uL — ABNORMAL LOW (ref 150–400)
RBC: 3.16 MIL/uL — ABNORMAL LOW (ref 3.87–5.11)
RDW: 17.3 % — ABNORMAL HIGH (ref 11.5–15.5)
WBC: 4.3 10*3/uL (ref 4.0–10.5)
nRBC: 0 % (ref 0.0–0.2)

## 2021-10-20 LAB — COMPREHENSIVE METABOLIC PANEL
ALT: 15 U/L (ref 0–44)
AST: 16 U/L (ref 15–41)
Albumin: 3.7 g/dL (ref 3.5–5.0)
Alkaline Phosphatase: 181 U/L — ABNORMAL HIGH (ref 38–126)
Anion gap: 10 (ref 5–15)
BUN: 28 mg/dL — ABNORMAL HIGH (ref 8–23)
CO2: 27 mmol/L (ref 22–32)
Calcium: 9.2 mg/dL (ref 8.9–10.3)
Chloride: 96 mmol/L — ABNORMAL LOW (ref 98–111)
Creatinine, Ser: 2.66 mg/dL — ABNORMAL HIGH (ref 0.44–1.00)
GFR, Estimated: 17 mL/min — ABNORMAL LOW (ref >60)
Glucose, Bld: 134 mg/dL — ABNORMAL HIGH (ref 70–99)
Potassium: 3.1 mmol/L — ABNORMAL LOW (ref 3.5–5.1)
Sodium: 133 mmol/L — ABNORMAL LOW (ref 135–145)
Total Bilirubin: 1.2 mg/dL (ref 0.3–1.2)
Total Protein: 6.9 g/dL (ref 6.5–8.1)

## 2021-10-20 LAB — MAGNESIUM: Magnesium: 2.4 mg/dL (ref 1.7–2.4)

## 2021-10-20 LAB — HEPATITIS B SURFACE ANTIGEN: Hepatitis B Surface Ag: NONREACTIVE

## 2021-10-20 LAB — TROPONIN I (HIGH SENSITIVITY)
Troponin I (High Sensitivity): 50 ng/L — ABNORMAL HIGH (ref <18)
Troponin I (High Sensitivity): 24 ng/L — ABNORMAL HIGH (ref <18)

## 2021-10-20 LAB — BRAIN NATRIURETIC PEPTIDE: B Natriuretic Peptide: 2407 pg/mL — ABNORMAL HIGH (ref 0.0–100.0)

## 2021-10-20 LAB — HEPATITIS B CORE ANTIBODY, TOTAL: Hep B Core Total Ab: NONREACTIVE

## 2021-10-20 MED ORDER — ONDANSETRON HCL 4 MG PO TABS: 4.0000 mg | ORAL_TABLET | Freq: Four times a day (QID) | ORAL | Status: DC | PRN

## 2021-10-20 MED ORDER — ONDANSETRON HCL 4 MG/2ML IJ SOLN: 4.0000 mg | Freq: Four times a day (QID) | INTRAMUSCULAR | Status: DC | PRN

## 2021-10-20 MED ORDER — LIDOCAINE HCL (PF) 1 % IJ SOLN: 5.0000 mL | INTRAMUSCULAR | Status: DC | PRN

## 2021-10-20 MED ORDER — PENTAFLUOROPROP-TETRAFLUOROETH EX AERO: 1.0000 | INHALATION_SPRAY | CUTANEOUS | Status: DC | PRN

## 2021-10-20 MED ORDER — LIDOCAINE-PRILOCAINE 2.5-2.5 % EX CREA: 1.0000 | TOPICAL_CREAM | CUTANEOUS | Status: DC | PRN

## 2021-10-20 MED ORDER — HEPARIN SODIUM (PORCINE) 1000 UNIT/ML DIALYSIS: 1000.0000 [IU] | INTRAMUSCULAR | Status: DC | PRN

## 2021-10-20 MED ORDER — NEPRO/CARBSTEADY PO LIQD
237.0000 mL | Freq: Two times a day (BID) | ORAL | Status: DC
Start: 2021-10-21 — End: 2021-10-24
  Administered 2021-10-21 – 2021-10-23 (×3): 237 mL via ORAL

## 2021-10-20 MED ORDER — CHLORHEXIDINE GLUCONATE CLOTH 2 % EX PADS
6.0000 | MEDICATED_PAD | Freq: Every day | CUTANEOUS | Status: DC
  Administered 2021-10-21 – 2021-10-24 (×4): 6 via TOPICAL

## 2021-10-20 MED ORDER — POTASSIUM CHLORIDE 10 MEQ/100ML IV SOLN
10.0000 meq | INTRAVENOUS | Status: AC
  Administered 2021-10-20: 10 meq via INTRAVENOUS
  Filled 2021-10-20: qty 100

## 2021-10-20 MED ORDER — ALTEPLASE 2 MG IJ SOLR: 2.0000 mg | Freq: Once | INTRAMUSCULAR | Status: DC | PRN

## 2021-10-20 MED ORDER — APIXABAN 2.5 MG PO TABS
2.5000 mg | ORAL_TABLET | Freq: Two times a day (BID) | ORAL | Status: DC
  Administered 2021-10-20 – 2021-10-24 (×8): 2.5 mg via ORAL
  Filled 2021-10-20 (×8): qty 1

## 2021-10-20 MED ORDER — ALBUMIN HUMAN 25 % IV SOLN
12.5000 g | Freq: Once | INTRAVENOUS | Status: AC
  Administered 2021-10-20: 12.5 g via INTRAVENOUS

## 2021-10-20 MED ORDER — ACETAMINOPHEN 325 MG PO TABS
650.0000 mg | ORAL_TABLET | Freq: Once | ORAL | Status: AC
  Administered 2021-10-20: 650 mg via ORAL
  Filled 2021-10-20: qty 2

## 2021-10-20 MED ORDER — ANTICOAGULANT SODIUM CITRATE 4% (200MG/5ML) IV SOLN
5.0000 mL | Status: DC | PRN
Start: 2021-10-20 — End: 2021-10-24

## 2021-10-20 MED ORDER — SEVELAMER CARBONATE 800 MG PO TABS
800.0000 mg | ORAL_TABLET | Freq: Three times a day (TID) | ORAL | Status: DC
  Administered 2021-10-21 – 2021-10-24 (×9): 800 mg via ORAL
  Filled 2021-10-20 (×9): qty 1

## 2021-10-20 MED ORDER — ACETAMINOPHEN 325 MG PO TABS
650.0000 mg | ORAL_TABLET | Freq: Four times a day (QID) | ORAL | Status: DC | PRN
  Administered 2021-10-21 – 2021-10-23 (×5): 650 mg via ORAL
  Filled 2021-10-20 (×5): qty 2

## 2021-10-20 MED ORDER — ATORVASTATIN CALCIUM 40 MG PO TABS
40.0000 mg | ORAL_TABLET | Freq: Every day | ORAL | Status: DC
  Administered 2021-10-20 – 2021-10-23 (×4): 40 mg via ORAL
  Filled 2021-10-20 (×4): qty 1

## 2021-10-20 MED ORDER — ACETAMINOPHEN 650 MG RE SUPP: 650.0000 mg | Freq: Four times a day (QID) | RECTAL | Status: DC | PRN

## 2021-10-20 MED ORDER — FUROSEMIDE 10 MG/ML IJ SOLN: 80.0000 mg | Freq: Once | INTRAMUSCULAR | Status: DC

## 2021-10-20 NOTE — ED Provider Notes (Addendum)
[facility] EMERGENCY DEPARTMENT Provider Note   CSN: [id number] Arrival date & time: 10/20/21  1351     History  Chief Complaint  Patient presents with   Chest Pain    Jody [name] is a 81 y.o. female.  81 yo female with the past medical history of atrial fibrillation, T2DM, COPD, heart failure and ESRD nonoliguric on HD who presented with shortness of breath, and chest pain.  Patient received almost full dialysis session today.  She states she had about 20 minutes left of the session.  Chest pain started this morning, she has been short of breath since last night with associated cough.  She reports the chest pain as a burning sensation.  This also radiates down to her abdomen.  Reports compliance with all of her home medications.  States she was taken off of her maintenance diuretic few years ago when she started HD.  Questionable compliance with diuretic.  States she was recently taken off of it.  The history is provided by the patient. No language interpreter was used.       Home Medications Prior to Admission medications   Medication Sig Start Date End Date Taking? Authorizing Provider  acetaminophen (TYLENOL) 325 MG tablet Take 325 mg by mouth 3 (three) times daily.    [provider]  albuterol (PROVENTIL) (2.5 MG/3ML) 0.083% nebulizer solution Take 2.5 mg by nebulization every 6 (six) hours as needed for wheezing or shortness of breath.    [provider]  albuterol (VENTOLIN HFA) 108 (90 Base) MCG/ACT inhaler Inhale 2 puffs into the lungs every 6 (six) hours as needed for wheezing or shortness of breath. Reported on 03/01/2015 Patient taking differently: Inhale 1-2 puffs into the lungs every 6 (six) hours as needed for wheezing or shortness of breath. Reported on 03/01/2015 07/30/21   Emokpae, Courage, MD  apixaban (ELIQUIS) 2.5 MG TABS tablet Take 2.5 mg by mouth 2 (two) times daily.    [provider]  atorvastatin (LIPITOR) 40 MG tablet Take 1  tablet by mouth at bedtime. 01/22/16   [provider]  dextromethorphan-guaiFENesin (MUCINEX DM) 30-600 MG 12hr tablet Take 1 tablet by mouth 2 (two) times daily. 07/30/21   Emokpae, Courage, MD  ferrous sulfate 325 (65 FE) MG tablet Take 325 mg by mouth 2 (two) times daily with a meal.    [provider]  folic acid (FOLVITE) 800 MCG tablet Take 800 mcg by mouth daily.    [provider]  gabapentin (NEURONTIN) 300 MG capsule Take 300 mg by mouth daily. 05/11/21   [provider]  Multiple Vitamins-Minerals (PRESERVISION AREDS) TABS Take 1 tablet by mouth at bedtime.    [provider]  Nutritional Supplements (FEEDING SUPPLEMENT, NEPRO CARB STEADY,) LIQD Take 237 mLs by mouth 2 (two) times daily between meals. 06/07/21   Tat, David, MD  sevelamer carbonate (RENVELA) 800 MG tablet Take 800 mg by mouth 3 (three) times daily with meals.  07/22/19   [provider]      Allergies    Codeine    Review of Systems   Review of Systems  Constitutional:  Negative for chills and fever.  Respiratory:  Positive for cough and shortness of breath.   Cardiovascular:  Positive for chest pain and leg swelling. Negative for palpitations.  Gastrointestinal:  Negative for abdominal pain.  Genitourinary:  Negative for dysuria.  Neurological:  Negative for weakness and light-headedness.  All other systems reviewed and are negative.   Physical   Exam Updated Vital Signs BP (!) 117/56 (BP Location: Left Arm)   Pulse 81   Temp 97.6 F (36.4 C) (Oral)   Resp (!) 26   Ht 5' 2" (1.575 m)   Wt 75 kg   BMI 30.24 kg/m  Physical Exam Vitals and nursing note reviewed.  Constitutional:      General: She is not in acute distress.    Appearance: Normal appearance. She is not ill-appearing.  HENT:     Head: Normocephalic and atraumatic.     Nose: Nose normal.  Eyes:     General: No scleral icterus.    Extraocular Movements: Extraocular movements intact.      Conjunctiva/sclera: Conjunctivae normal.  Cardiovascular:     Rate and Rhythm: Normal rate and regular rhythm.     Pulses: Normal pulses.  Pulmonary:     Effort: Pulmonary effort is normal. No respiratory distress.     Breath sounds: Normal breath sounds. No wheezing or rales.  Abdominal:     General: There is no distension.     Tenderness: There is no abdominal tenderness.  Musculoskeletal:        General: Normal range of motion.     Cervical back: Normal range of motion.     Right lower leg: Edema present.     Left lower leg: Edema present.  Skin:    General: Skin is warm and dry.  Neurological:     General: No focal deficit present.     Mental Status: She is alert. Mental status is at baseline.     ED Results / Procedures / Treatments   Labs (all labs ordered are listed, but only abnormal results are displayed) Labs Reviewed  RESP PANEL BY RT-PCR (FLU A&B, COVID) ARPGX2  CBC WITH DIFFERENTIAL/PLATELET  COMPREHENSIVE METABOLIC PANEL  BRAIN NATRIURETIC PEPTIDE  TROPONIN I (HIGH SENSITIVITY)    EKG None  Radiology No results found.  Procedures Procedures    Medications Ordered in ED Medications - No data to display  ED Course/ Medical Decision Making/ A&P Clinical Course as of 10/20/21 1631  Thu Oct 20, 2021  1450 She has a history of A-fib on Eliquis.  She said she had some beef stew last night and has had vomiting since then.  Vomited at dialysis today.  Complaining of burning pain in her stomach and shortness of breath.  EKG showed A-fib with controlled rate.  Getting labs chest x-ray.  Disposition per results of testing. [MB]  1610 Work-up reveals CBC without leukocytosis.  Hemoglobin which is around patient's baseline.   9.9 creatinine around patient's baseline.  Mild hypokalemia at 3.1 repletion ordered.  Given she makes minimal urine and ultrafiltration will be scheduled by nephrology we will defer diuretic at this time.  VBG ordered.  Chest x-ray with mild  interstitial edema noted.  BNP of about 2500.  Troponin elevated to about 50.  This is around patient's baseline.  EKG shows persistent A-fib but rate controlled. Case discussed with Dr. Singh of nephrology who will schedule ultrafiltration for patient.  Will discuss with hospitalist for admission. [AA]    Clinical Course User Index [AA] Ali, Amjad, PA-C [MB] Butler, Michael C, MD                           Medical Decision Making Amount and/or Complexity of Data Reviewed Labs: ordered. Radiology: ordered.  Risk OTC drugs. Prescription drug management. Decision regarding hospitalization.   Medical Decision   Making / ED Course   This patient presents to the ED for concern of shortness of breath, this involves an extensive number of treatment options, and is a complaint that carries with it a high risk of complications and morbidity.  The differential diagnosis includes CHF exacerbation, PE, pneumonia, ACS  MDM: 81-year-old female with past medical history significant for A-fib on Eliquis, diabetes, COPD, heart failure and ESRD on HD.  Patient is Tuesday, Thursday, Saturday schedule.  Reports compliance with all of her recent sessions.  Today her session was cut short by 20 minutes due to her complaint of chest pain.  Makes minimal urine.  Not on chronic diuretic.  Will evaluate with blood work including troponin, BNP, chest x-ray.   On reevaluation patient reports improvement in chest pain, and abdominal pain.  Alk phos elevated but otherwise LFTs normal. As noted above discussed with Dr. Singh of nephrology who will manage volume overload.  Will discuss with hospitalist for admission. Discussed with hospitalist will evaluate patient for admission.   Lab Tests: -I ordered, reviewed, and interpreted labs.   The pertinent results include:   Labs Reviewed  CBC WITH DIFFERENTIAL/PLATELET - Abnormal; Notable for the following components:      Result Value   RBC 3.10 (*)    Hemoglobin  9.9 (*)    HCT 34.3 (*)    MCV 110.6 (*)    MCHC 28.9 (*)    RDW 17.5 (*)    Lymphs Abs 0.5 (*)    All other components within normal limits  COMPREHENSIVE METABOLIC PANEL - Abnormal; Notable for the following components:   Sodium 133 (*)    Potassium 3.1 (*)    Chloride 96 (*)    Glucose, Bld 134 (*)    BUN 28 (*)    Creatinine, Ser 2.66 (*)    Alkaline Phosphatase 181 (*)    GFR, Estimated 17 (*)    All other components within normal limits  BRAIN NATRIURETIC PEPTIDE - Abnormal; Notable for the following components:   B Natriuretic Peptide 2,407.0 (*)    All other components within normal limits  TROPONIN I (HIGH SENSITIVITY) - Abnormal; Notable for the following components:   Troponin I (High Sensitivity) 50 (*)    All other components within normal limits  RESP PANEL BY RT-PCR (FLU A&B, COVID) ARPGX2  HEPATITIS B SURFACE ANTIGEN  HEPATITIS B SURFACE ANTIBODY,QUALITATIVE  HEPATITIS B SURFACE ANTIBODY, QUANTITATIVE  HEPATITIS B CORE ANTIBODY, TOTAL  HEPATITIS C ANTIBODY  I-STAT VENOUS BLOOD GAS, ED  TROPONIN I (HIGH SENSITIVITY)      EKG  EKG Interpretation  Date/Time:  Thursday October 20 2021 14:02:04 EDT Ventricular Rate:  93 PR Interval:    QRS Duration: 129 QT Interval:  346 QTC Calculation: 431 R Axis:   144 Text Interpretation: Atrial fibrillation RBBB and LPFB Nonspecific T abnormalities, lateral leads No significant change since prior 7/23 Confirmed by Butler, Michael ([id number]) on 10/20/2021 2:24:52 PM         Imaging Studies ordered: I ordered imaging studies including chest x-ray I independently visualized and interpreted imaging. I agree with the radiologist interpretation   Medicines ordered and prescription drug management: Meds ordered this encounter  Medications   acetaminophen (TYLENOL) tablet 650 mg   potassium chloride 10 mEq in 100 mL IVPB   DISCONTD: furosemide (LASIX) injection 80 mg   Chlorhexidine Gluconate Cloth 2 % PADS 6 each     -I have reviewed the patients home medicines and have made   adjustments as needed  Consultations Obtained: I requested consultation with the nephrology and discussed lab and imaging findings as well as pertinent plan - they recommend: As above   Reevaluation: After the interventions noted above, I reevaluated the patient and found that they have :improved  Co morbidities that complicate the patient evaluation  Past Medical History:  Diagnosis Date   Anemia in chronic kidney disease (CODE) 06/05/2015   Arthritis    "bad in my legs" (12/07/2014)   Chronic atrial fibrillation (HCC) 06/05/2015   Chronic back pain    "dr said my spine is crooked"   Chronic bronchitis (HCC)    "get it q yr" (12/07/2014)   Complication of anesthesia    headache for 2 days after surgery in May 2019   COPD (chronic obstructive pulmonary disease) (HCC)    ESRD on hemodialysis (HCC) 06/05/2015   Gait difficulty 09/02/2014   Hypercholesterolemia    Hypertension    Neuropathy 2015   in legs   NSVT (nonsustained ventricular tachycardia) (HCC)    Sinus brady-tachy syndrome (HCC)    Stroke (HCC) 05/2014   denies residual on 12/07/2014   Type II diabetes mellitus (HCC)       Dispostion: Discussed with hospitalist who will evaluate patient for admission.  Final Clinical Impression(s) / ED Diagnoses Final diagnoses:  Acute congestive heart failure, unspecified heart failure type (HCC)  Acute respiratory failure with hypoxia (HCC)    Rx / DC Orders ED Discharge Orders     None          Ali, Amjad, PA-C 10/20/21 1636    Butler, Michael C, MD 10/20/21 1739  

## 2021-10-20 NOTE — Assessment & Plan Note (Signed)
Potassium 3.1.  Normal magnesium 2.4. -Supplemented in the ED, and per HD

## 2021-10-20 NOTE — Assessment & Plan Note (Addendum)
Was above her usual dry weight - Manage volume with HD -06/03/21 Echo EF 50%, RV overload, severe TR -08/02/20 Echo EF 45-50% HK inferoseptal, inferolateral, Inf wall; mod TR -personally reviewed CXR 4/21--increased interstitial markings Ultrafiltration 1.7 L on 10/20/2021 dialysis 10/22/2021>>3.2L -- Repeat limited echocardiogram

## 2021-10-20 NOTE — Progress Notes (Signed)
Received patient in bed to unit.  Alert and oriented.  Informed consent signed and in chart.   Treatment initiated: 1710 Treatment completed: 1940  Patient tolerated well.  Transported back to the room  Alert, without acute distress.  Hand-off given to patient's nurse.   Access used: AVF Access issues: none  Total UF removed: 1.7 L Medication(s) given: Albumin 12.5 g IV Post HD VS: 110/58 P 98 R 18  Post HD weight: 73.3 kg.   [facility]

## 2021-10-20 NOTE — Assessment & Plan Note (Addendum)
Schedule--Tuesday Thursday Saturday.   Reports compliance with her HD sessions.  The session was terminated 20 minutes before this to be completed due to complaints of pain. -Resume Renal meds. -Appreciate nephrology -last HD on 10/22/21 -next HD on 10/25/21

## 2021-10-20 NOTE — ED Notes (Signed)
Patient moved to 5l o2 due to desaturation. MD notified.

## 2021-10-20 NOTE — H&P (Signed)
History and Physical    Ellaree Gear YCX:[id number] DOB: 1940/07/21 DOA: 10/20/2021  PCP: Monico Blitz, MD   Patient coming from: Home  I have personally briefly reviewed patient's old medical records in [place]  Chief Complaint: Difficulty breathing, Chest pain  HPI: Jody [name] is a 81 y.o. female with medical history significant for ESRD, COPD, diastolic CHF, chronic respiratory failure on 3 L, hypertension, atrial fibrillation, diabetes mellitus. Patient presented to the ED with complaints of pain.  She denies chest pain to me.  She reports she was having pain in her head back and legs.  This was about 20 minutes towards the end of her dialysis session today.  So her dialysis was stopped.  She reports some difficulty breathing, and worsening of her leg swelling.  Reports a cough productive of white phlegm. Her voice is hoarse, she says its been this way for about 3 weeks now.  Recent hospitalization 7/25 to 7/27 for similar-decompensated CHF, acute hypoxic respiratory failure due to pulmonary edema, bilateral pleural effusions, volume overload.  ED Course: Tmax 98.  Heart rate mostly 65-96. Respiratory rate 14-28.  Blood pressure systolic 49-702. O2 sats down to 74 % on patient's home 3 L, she was placed on 5 L with sats improving to 93%.  Chest x-ray showed mild interstitial edema.  EKG shows atrial fibrillation.  BNP elevated at 2407.  Potassium 3.1.  Troponin 50 > 24. Nephrology was consulted, plan was for HD today- ultrafiltration.  Full consult in the morning.  Review of Systems: As per HPI all other systems reviewed and negative.  Past Medical History:  Diagnosis Date   Anemia in chronic kidney disease (CODE) 06/05/2015   Arthritis    "bad in my legs" (12/07/2014)   Chronic atrial fibrillation ([place]) 06/05/2015   Chronic back pain    "dr said my spine is crooked"   Chronic bronchitis ([place])    "get it q yr" (63/78/5885)   Complication of anesthesia     headache for 2 days after surgery in May 2019   COPD (chronic obstructive pulmonary disease) ([place])    ESRD on hemodialysis ([place]) 06/05/2015   Gait difficulty 09/02/2014   Hypercholesterolemia    Hypertension    Neuropathy 2015   in legs   NSVT (nonsustained ventricular tachycardia) ([place])    Sinus brady-tachy syndrome ([place])    Stroke ([place]) 05/2014   denies residual on 12/07/2014   Type II diabetes mellitus [facility])     Past Surgical History:  Procedure Laterality Date   ABDOMINAL AORTOGRAM W/LOWER EXTREMITY Left 04/06/2021   Procedure: ABDOMINAL AORTOGRAM W/LOWER EXTREMITY;  Surgeon: Broadus John, MD;  Location: [place] [facility];  Service: Cardiovascular;  Laterality: Left;   ABDOMINAL HYSTERECTOMY  ~ 1970   AMPUTATION TOE Left 04/08/2021   Procedure: FIRST TOE LEFT FOOT AMPUTATION;  Surgeon: Trula Slade, DPM;  Location: [place];  Service: Podiatry;  Laterality: Left;   APPENDECTOMY  ~ [place] Right 07/06/2017   Procedure: RIGHT RADIOCEPHALIC ARTERIOVENOUS FISTULA creation;  Surgeon: Conrad New Cassel, MD;  Location: [place];  Service: Vascular;  Laterality: Right;   [place] Right 09/17/2017   Procedure: SECOND STAGE BASILIC VEIN TRANSPOSITION RIGHT ARM;  Surgeon: Rosetta Posner, MD;  Location: [place];  Service: Vascular;  Laterality: Right;   COLONOSCOPY N/A 10/28/2017   Procedure: COLONOSCOPY;  Surgeon: Rogene Houston, MD;  [facility];  Service: Endoscopy;  Laterality: N/A;  COLONOSCOPY WITH PROPOFOL N/A 12/29/2019   Procedure: COLONOSCOPY WITH PROPOFOL;  Surgeon: Eloise Harman, DO;  [facility];  Service: Endoscopy;  Laterality: N/A;   ESOPHAGOGASTRODUODENOSCOPY (EGD) WITH PROPOFOL N/A 10/31/2017   Procedure: ESOPHAGOGASTRODUODENOSCOPY (EGD) WITH PROPOFOL;  Surgeon: Rogene Houston, MD;  [facility];  Service: Endoscopy;  Laterality: N/A;   JOINT REPLACEMENT     PERIPHERAL VASCULAR BALLOON ANGIOPLASTY  Left 04/06/2021   Procedure: PERIPHERAL VASCULAR BALLOON ANGIOPLASTY;  Surgeon: Broadus John, MD;  Location: [place] [facility];  Service: Cardiovascular;  Laterality: Left;  left sfa succesful [place] AT Unsuccesful   TOTAL KNEE ARTHROPLASTY Right ~ [place]  ~ 1970   "9# in my stomach"     reports that she quit smoking about 17 years ago. Her smoking use included cigarettes. She started smoking about 68 years ago. She has a 25.00 pack-year smoking history. She has never been exposed to tobacco smoke. She has never used smokeless tobacco. She reports that she does not drink alcohol and does not use drugs.  Allergies  Allergen Reactions   Codeine Nausea And Vomiting    Family History  Problem Relation Age of Onset   Cancer Other    Heart attack Brother    Cancer Sister        breast   Alcohol abuse Brother     Prior to Admission medications   Medication Sig Start Date End Date Taking? Authorizing Provider  acetaminophen (TYLENOL) 325 MG tablet Take 325 mg by mouth 3 (three) times daily.    [provider]  albuterol (PROVENTIL) (2.5 MG/3ML) 0.083% nebulizer solution Take 2.5 mg by nebulization every 6 (six) hours as needed for wheezing or shortness of breath.    [provider]  albuterol (VENTOLIN HFA) 108 (90 Base) MCG/ACT inhaler Inhale 2 puffs into the lungs every 6 (six) hours as needed for wheezing or shortness of breath. Reported on 03/01/2015 Patient taking differently: Inhale 1-2 puffs into the lungs every 6 (six) hours as needed for wheezing or shortness of breath. Reported on 03/01/2015 07/30/21   Roxan Hockey, MD  apixaban (ELIQUIS) 2.5 MG TABS tablet Take 2.5 mg by mouth 2 (two) times daily.    [provider]  atorvastatin (LIPITOR) 40 MG tablet Take 1 tablet by mouth at bedtime. 01/22/16   [provider]  dextromethorphan-guaiFENesin (MUCINEX DM) 30-600 MG 12hr tablet Take 1 tablet by mouth 2 (two) times daily. 07/30/21    Roxan Hockey, MD  ferrous sulfate 325 (65 FE) MG tablet Take 325 mg by mouth 2 (two) times daily with a meal.    [provider]  folic acid (FOLVITE) 943 MCG tablet Take 800 mcg by mouth daily.    [provider]  gabapentin (NEURONTIN) 300 MG capsule Take 300 mg by mouth daily. 05/11/21   [provider]  Multiple Vitamins-Minerals (PRESERVISION AREDS) TABS Take 1 tablet by mouth at bedtime.    [provider]  Nutritional Supplements (FEEDING SUPPLEMENT, NEPRO CARB STEADY,) LIQD Take 237 mLs by mouth 2 (two) times daily between meals. 06/07/21   Orson Eva, MD  sevelamer carbonate (RENVELA) 800 MG tablet Take 800 mg by mouth 3 (three) times daily with meals.  07/22/19   [provider]    Physical Exam: Vitals:   10/20/21 1500 10/20/21 1530 10/20/21 1535 10/20/21 1552  BP: 112/82 101/85    Pulse:  (!) 53    Resp: (!) 28 [name])  25    Temp:      TempSrc:      SpO2: (!) 86% (!) 74% (!) 89% 93%  Weight:      Height:        Constitutional: NAD, calm, comfortable Vitals:   10/20/21 1500 10/20/21 1530 10/20/21 1535 10/20/21 1552  BP: 112/82 101/85    Pulse:  (!) 53    Resp: (!) 28 (!) 25    Temp:      TempSrc:      SpO2: (!) 86% (!) 74% (!) 89% 93%  Weight:      Height:       Eyes: PERRL, lids and conjunctivae normal ENMT: Mucous membranes are moist.  Neck: normal, supple, no masses, no thyromegaly Respiratory: clear to auscultation bilaterally, no wheezing, no crackles.  Cardiovascular: Regular rate and rhythm, no murmurs / rubs / gallops.  2+ pitting extremity edema to knees bilaterally.  Abdomen: no tenderness, no masses palpated. No hepatosplenomegaly. Bowel sounds positive.  Musculoskeletal: no clubbing / cyanosis. No joint deformity upper and lower extremities.  Skin: no rashes, lesions, ulcers. No induration Neurologic: Moving all extremities spontaneously, no facial asymmetry.  Voice is hoarse, but speech is fluent  Psychiatric:  Normal judgment and insight. Alert and oriented x 3. Normal mood.   Labs on Admission: I have personally reviewed following labs and imaging studies  CBC: Recent Labs  Lab 10/20/21 1441  WBC 5.4  NEUTROABS 4.2  HGB 9.9*  HCT 34.3*  MCV 110.6*  PLT 263   Basic Metabolic Panel: Recent Labs  Lab 10/20/21 1441  NA 133*  K 3.1*  CL 96*  CO2 27  GLUCOSE 134*  BUN 28*  CREATININE 2.66*  CALCIUM 9.2   GFR: Estimated Creatinine Clearance: 15.2 mL/min (A) (by C-G formula based on SCr of 2.66 mg/dL (H)). Liver Function Tests: Recent Labs  Lab 10/20/21 1441  AST 16  ALT 15  ALKPHOS 181*  BILITOT 1.2  PROT 6.9  ALBUMIN 3.7   Radiological Exams on Admission: DG Chest Portable 1 View  Result Date: 10/20/2021 CLINICAL DATA:  Chest pain. Shortness of breath. Symptoms began during dialysis. EXAM: PORTABLE CHEST 1 VIEW COMPARISON:  One-view chest x-ray 09/06/2021 FINDINGS: The heart is large. Atherosclerotic calcifications are present at the aortic arch. Mild diffuse interstitial pattern is present. No significant scratched at no focal airspace consolidation is present. Chronic elevation of the right hemidiaphragm is noted. Lung volumes are low. Marked degenerative changes are present in both shoulders. IMPRESSION: 1. Cardiomegaly with mild interstitial edema. 2. No focal airspace disease. Electronically Signed   By: San Morelle M.D.   On: 10/20/2021 14:42    EKG: Independently reviewed.   Assessment/Plan Principal Problem:   Acute on chronic respiratory failure with hypoxia (HCC) Active Problems:   ESRD on dialysis ([place])   Acute on chronic diastolic CHF (congestive heart failure) (HCC)   Persistent atrial fibrillation (HCC)   Essential hypertension   Type 2 diabetes mellitus with nephropathy (HCC)   Hypokalemia   COPD (chronic obstructive pulmonary disease) (HCC)   Assessment and Plan: * Acute on chronic respiratory failure with hypoxia (HCC) O2 sats down to 74% on  home 3 L, placed on 5 L sats 93% currently.  Likely from volume overload.  Chest x-ray showing mild interstitial edema.   ESRD on dialysis [facility]) Schedule Tuesday Thursday Saturday.  Reports compliance with her HD sessions.  The session was terminated 20 minutes before this to be completed due to complaints  of pain. -Resume Renal meds.  Acute on chronic diastolic CHF (congestive heart failure) (HCC) Decompensated CHF.  Last echo 04/2021.  EF of 50%, moderate increase in pulmonary artery pressure.  She makes small amount of urine. -Nephrology consulted in ED -Per HD-transferred from ED to HD suite for ultrafiltration history.  Persistent atrial fibrillation (HCC) On anticoagulation with Eliquis. Rate Controlled, heart rate 50s to 90s.  Not on rate limiting agents. - Resume Eliquis  Essential hypertension Stable.  Not on medication.  Type 2 diabetes mellitus with nephropathy (HCC) A1c 5.7.  Not on medication.  Monitor CBG every 12 hourly.  COPD (chronic obstructive pulmonary disease) (HCC) Stable.  Hypokalemia Potassium 3.1.  Normal magnesium 2.4. -Supplemented in the ED, and per HD    DVT prophylaxis: Eliquis Code Status: Full code- Confirmed with patient at bedside Family Communication: None at bedside Disposition Plan: ~ 2 days Consults called: Nephrology Admission status:  Obs tele   Author: Bethena Roys, MD 10/20/2021 7:21 PM  For on call review [email or web address].

## 2021-10-20 NOTE — Assessment & Plan Note (Signed)
Stable

## 2021-10-20 NOTE — Assessment & Plan Note (Addendum)
On anticoagulation with Eliquis. Rate Controlled, heart rate 50s to 90s.  Not on rate limiting agents. - Resume Eliquis 10/23/21--pt had RVR>>transfer to SDU>>started on IV lopressor -transitioned to po metoprolol succinate -now rate controlled

## 2021-10-20 NOTE — ED Triage Notes (Signed)
Pt c/o chest pain and shortness of breath while at dialysis. Pt, reports she has had a cough for a week or so.

## 2021-10-20 NOTE — Assessment & Plan Note (Addendum)
Previously on metoprolol which has been discontinued BPs remain stable

## 2021-10-20 NOTE — Assessment & Plan Note (Signed)
O2 sats down to 74% on home 3 L, placed on 5 L sats 93% currently.  Likely from volume overload.  Chest x-ray showing mild interstitial edema.

## 2021-10-20 NOTE — Assessment & Plan Note (Addendum)
A1c 5.7.  Not on medication.

## 2021-10-20 NOTE — Progress Notes (Signed)
Informed by ER in regards to patient. In decompensated CHF with edema on CXR. Did do HD as an outpatient today. Will arrange for UF only treatment today, will aim for 2-3L UF today. Discussed w/ HD RN. Please call on-call nephrologist with any questions/concerns. Formal consult to follow in AM.  Gean Quint, MD [facility]

## 2021-10-21 DIAGNOSIS — E669 Obesity, unspecified: Secondary | ICD-10-CM | POA: Diagnosis not present

## 2021-10-21 DIAGNOSIS — J9621 Acute and chronic respiratory failure with hypoxia: Secondary | ICD-10-CM | POA: Diagnosis not present

## 2021-10-21 DIAGNOSIS — Z992 Dependence on renal dialysis: Secondary | ICD-10-CM | POA: Diagnosis not present

## 2021-10-21 DIAGNOSIS — I132 Hypertensive heart and chronic kidney disease with heart failure and with stage 5 chronic kidney disease, or end stage renal disease: Secondary | ICD-10-CM | POA: Diagnosis not present

## 2021-10-21 DIAGNOSIS — J9601 Acute respiratory failure with hypoxia: Secondary | ICD-10-CM | POA: Diagnosis not present

## 2021-10-21 DIAGNOSIS — I5043 Acute on chronic combined systolic (congestive) and diastolic (congestive) heart failure: Secondary | ICD-10-CM | POA: Diagnosis not present

## 2021-10-21 DIAGNOSIS — I5033 Acute on chronic diastolic (congestive) heart failure: Secondary | ICD-10-CM | POA: Diagnosis not present

## 2021-10-21 DIAGNOSIS — E8779 Other fluid overload: Secondary | ICD-10-CM | POA: Diagnosis not present

## 2021-10-21 DIAGNOSIS — D696 Thrombocytopenia, unspecified: Secondary | ICD-10-CM | POA: Diagnosis not present

## 2021-10-21 DIAGNOSIS — D631 Anemia in chronic kidney disease: Secondary | ICD-10-CM | POA: Diagnosis not present

## 2021-10-21 DIAGNOSIS — N186 End stage renal disease: Secondary | ICD-10-CM | POA: Diagnosis not present

## 2021-10-21 LAB — GLUCOSE, CAPILLARY
Glucose-Capillary: 134 mg/dL — ABNORMAL HIGH (ref 70–99)
Glucose-Capillary: 177 mg/dL — ABNORMAL HIGH (ref 70–99)

## 2021-10-21 LAB — BASIC METABOLIC PANEL
Anion gap: 12 (ref 5–15)
BUN: 26 mg/dL — ABNORMAL HIGH (ref 8–23)
CO2: 27 mmol/L (ref 22–32)
Calcium: 9.6 mg/dL (ref 8.9–10.3)
Chloride: 97 mmol/L — ABNORMAL LOW (ref 98–111)
Creatinine, Ser: 2.61 mg/dL — ABNORMAL HIGH (ref 0.44–1.00)
GFR, Estimated: 18 mL/min — ABNORMAL LOW (ref >60)
Glucose, Bld: 108 mg/dL — ABNORMAL HIGH (ref 70–99)
Potassium: 3.6 mmol/L (ref 3.5–5.1)
Sodium: 136 mmol/L (ref 135–145)

## 2021-10-21 LAB — HEPATITIS B SURFACE ANTIBODY,QUALITATIVE: Hep B S Ab: REACTIVE — AB

## 2021-10-21 LAB — HEPATITIS C ANTIBODY: HCV Ab: NONREACTIVE

## 2021-10-21 LAB — HEPATITIS B SURFACE ANTIBODY, QUANTITATIVE: Hep B S AB Quant (Post): 133.9 m[IU]/mL (ref >9.9)

## 2021-10-21 MED ORDER — METHYLPREDNISOLONE SODIUM SUCC 125 MG IJ SOLR
60.0000 mg | Freq: Two times a day (BID) | INTRAMUSCULAR | Status: DC
  Administered 2021-10-22 – 2021-10-24 (×5): 60 mg via INTRAVENOUS
  Filled 2021-10-21 (×6): qty 2

## 2021-10-21 MED ORDER — ALBUTEROL SULFATE (2.5 MG/3ML) 0.083% IN NEBU
INHALATION_SOLUTION | RESPIRATORY_TRACT | Status: AC
  Administered 2021-10-21: 2.5 mg
  Filled 2021-10-21: qty 3

## 2021-10-21 MED ORDER — IPRATROPIUM-ALBUTEROL 0.5-2.5 (3) MG/3ML IN SOLN
3.0000 mL | Freq: Once | RESPIRATORY_TRACT | Status: AC
Start: 2021-10-21 — End: 2021-10-21
  Administered 2021-10-21: 3 mL via RESPIRATORY_TRACT

## 2021-10-21 MED ORDER — MELATONIN 3 MG PO TABS
6.0000 mg | ORAL_TABLET | Freq: Once | ORAL | Status: AC
  Administered 2021-10-21: 6 mg via ORAL
  Filled 2021-10-21: qty 2

## 2021-10-21 MED ORDER — METHYLPREDNISOLONE SODIUM SUCC 125 MG IJ SOLR
125.0000 mg | Freq: Once | INTRAMUSCULAR | Status: AC
  Administered 2021-10-21: 125 mg via INTRAVENOUS
  Filled 2021-10-21: qty 2

## 2021-10-21 MED ORDER — CALCITRIOL 0.25 MCG PO CAPS: 0.5000 ug | ORAL_CAPSULE | ORAL | Status: DC

## 2021-10-21 MED ORDER — MIDODRINE HCL 5 MG PO TABS
10.0000 mg | ORAL_TABLET | Freq: Three times a day (TID) | ORAL | Status: DC
  Administered 2021-10-21 – 2021-10-24 (×7): 10 mg via ORAL
  Filled 2021-10-21 (×7): qty 2

## 2021-10-21 MED ORDER — CINACALCET HCL 30 MG PO TABS: 30.0000 mg | ORAL_TABLET | ORAL | Status: DC

## 2021-10-21 MED ORDER — IPRATROPIUM-ALBUTEROL 0.5-2.5 (3) MG/3ML IN SOLN
3.0000 mL | RESPIRATORY_TRACT | Status: DC | PRN
  Administered 2021-10-21 – 2021-10-24 (×8): 3 mL via RESPIRATORY_TRACT
  Filled 2021-10-21 (×9): qty 3

## 2021-10-21 NOTE — Consult Note (Addendum)
ESRD Consult Note  Assessment/Recommendations:  # ESRD:  -Outpatient orders: [facility], TTS, 3 hours 15 minutes.  EDW 81 kg.  2K/2.5 calcium.  400/500.  Meds: Mircera 200 mcg every 2 weeks (last dose 9/5), Venofer 50 mg q. weekly (last dose 9/5), calcitriol 0.5 mcg 3 times a week, Sensipar 30 mg 3 times a week.  No heparin (history of GI bleed) -Ultrafiltration treatment on 9/7, next treatment 9/9.  Will attempt to keep her on TTS schedule  # Acute on chronic hypoxic respiratory failure, volume overload, acute on chronic dCHF exacerbation -s/p 1.7L UF 9/7, will continue to challenge EDW/UF as tolerated  # Volume/ hypertension: EDW 81 kg.  May need new dry weight on discharge, will continue to UF as tolerated  # Anemia of Chronic Kidney Disease: Hemoglobin 10.1.  Just received Mircera and Venofer as an outpatient this week.   # Secondary Hyperparathyroidism/Hyperphosphatemia: We will resume calcitriol and Sensipar  # Additional recommendations: - Dose all meds for creatinine clearance < 10 ml/min  - Unless absolutely necessary, no MRIs with gadolinium.  - Implement save arm precautions.  Prefer needle sticks in the dorsum of the hands or wrists.  No blood pressure measurements in arm. - If blood transfusion is requested during hemodialysis sessions, please alert [place] prior to the session.  - If a hemodialysis catheter line culture is requested, please alert [place] as only hemodialysis nurses are able to collect those specimens.   Recommendations were discussed with the primary team.   History of Present Illness: Jody [name] is a/an 81 y.o. female with a past medical history of ESRD, diastolic CHF, chronic respiratory failure who is on 3 L nasal cannula, hypertension, A-fib, DM, COPD who presents with volume overload postdialysis on 9/7.  Had cut her treatment time down by 20 minutes due to pain.  Had a similar admission in July.  Was informed by ER about this patient yesterday, arranged for  ultrafiltration treatment with net UF 1.7 L.  In ER, her CXR revealed cardiomegaly with mild interstitial edema, was on 5 L nasal cannula, BNP 2407. Patient seen and examined bedside. She reports that she tolerated yesterday UF treatment. Feels better. She is concerned about her living situation, doesn't have running water, electricity. Otherwise denies chest pain, dizziness.   Medications:  Current Facility-Administered Medications  Medication Dose Route Frequency Provider Last Rate Last Admin   acetaminophen (TYLENOL) tablet 650 mg  650 mg Oral Q6H PRN Emokpae, Ejiroghene E, MD       Or   acetaminophen (TYLENOL) suppository 650 mg  650 mg Rectal Q6H PRN Emokpae, Ejiroghene E, MD       alteplase (CATHFLO ACTIVASE) injection 2 mg  2 mg Intracatheter Once PRN Gean Quint, MD       anticoagulant sodium citrate solution 5 mL  5 mL Intracatheter PRN Gean Quint, MD       apixaban (ELIQUIS) tablet 2.5 mg  2.5 mg Oral BID Emokpae, Ejiroghene E, MD   2.5 mg at 10/21/21 0906   atorvastatin (LIPITOR) tablet 40 mg  40 mg Oral QHS Emokpae, Ejiroghene E, MD   40 mg at 10/20/21 2125   Chlorhexidine Gluconate Cloth 2 % PADS 6 each  6 each Topical Q0600 Emokpae, Ejiroghene E, MD   6 each at 10/21/21 0608   feeding supplement (NEPRO CARB STEADY) liquid 237 mL  237 mL Oral BID BM Emokpae, Ejiroghene E, MD   237 mL at 10/21/21 0907   heparin injection 1,000 Units  1,000 Units Intracatheter PRN Gean Quint, MD       ipratropium-albuterol (DUONEB) 0.5-2.5 (3) MG/3ML nebulizer solution 3 mL  3 mL Nebulization Q4H PRN Adefeso, Oladapo, DO   3 mL at 10/21/21 0030   lidocaine (PF) (XYLOCAINE) 1 % injection 5 mL  5 mL Intradermal PRN Gean Quint, MD       lidocaine-prilocaine (EMLA) cream 1 Application  1 Application Topical PRN Gean Quint, MD       ondansetron (ZOFRAN) tablet 4 mg  4 mg Oral Q6H PRN Emokpae, Ejiroghene E, MD       Or   ondansetron (ZOFRAN) injection 4 mg  4 mg Intravenous Q6H PRN Emokpae,  Ejiroghene E, MD       pentafluoroprop-tetrafluoroeth (GEBAUERS) aerosol 1 Application  1 Application Topical PRN Gean Quint, MD       sevelamer carbonate (RENVELA) tablet 800 mg  800 mg Oral TID WC Emokpae, Ejiroghene E, MD   800 mg at 10/21/21 2993     ALLERGIES Codeine  MEDICAL HISTORY Past Medical History:  Diagnosis Date   Anemia in chronic kidney disease (CODE) 06/05/2015   Arthritis    "bad in my legs" (12/07/2014)   Chronic atrial fibrillation ([place]) 06/05/2015   Chronic back pain    "dr said my spine is crooked"   Chronic bronchitis ([place])    "get it q yr" (71/69/6789)   Complication of anesthesia    headache for 2 days after surgery in May 2019   COPD (chronic obstructive pulmonary disease) ([place])    ESRD on hemodialysis ([place]) 06/05/2015   Gait difficulty 09/02/2014   Hypercholesterolemia    Hypertension    Neuropathy 2015   in legs   NSVT (nonsustained ventricular tachycardia) ([place])    Sinus brady-tachy syndrome ([place])    Stroke ([place]) 05/2014   denies residual on 12/07/2014   Type II diabetes mellitus ([place])      SOCIAL HISTORY Social History   Socioeconomic History   Marital status: Widowed    Spouse name: Not on file   Number of children: 0   Years of education: 9   Highest education level: Not on file  Occupational History   Occupation: retired  Tobacco Use   Smoking status: Former    Packs/day: 0.50    Years: 50.00    Total pack years: 25.00    Types: Cigarettes    Start date: 03/07/1953    Quit date: 01/14/2004    Years since quitting: 17.7    Passive exposure: Never   Smokeless tobacco: Never   Tobacco comments:    "quit smoking cigarettes  in ~ 2005"  Vaping Use   Vaping Use: Never used  Substance and Sexual Activity   Alcohol use: No    Alcohol/week: 0.0 standard drinks of alcohol    Comment: 10/242016 "quit drinking beer in ~ 1977"   Drug use: No   Sexual activity: Never  Other Topics Concern   Not on file  Social History Narrative    Patient drinks caffeine a few times a week.   Patient is right handed.   Social Determinants of Health   Financial Resource Strain: Not on file  Food Insecurity: No Food Insecurity (10/20/2021)   Hunger Vital Sign    Worried About Running Out of Food in the Last Year: Never true    Ran Out of Food in the Last Year: Never true  Transportation Needs: No Transportation Needs (10/20/2021)   PRAPARE - Transportation  Lack of Transportation (Medical): No    Lack of Transportation (Non-Medical): No  Physical Activity: Not on file  Stress: Not on file  Social Connections: Not on file  Intimate Partner Violence: Not At Risk (10/20/2021)   Humiliation, Afraid, Rape, and Kick questionnaire    Fear of Current or Ex-Partner: No    Emotionally Abused: No    Physically Abused: No    Sexually Abused: No     FAMILY HISTORY Family History  Problem Relation Age of Onset   Cancer Other    Heart attack Brother    Cancer Sister        breast   Alcohol abuse Brother      Review of Systems: 12 systems were reviewed and negative except per HPI  Physical Exam: Vitals:   10/21/21 0030 10/21/21 0500  BP:  103/66  Pulse:  76  Resp:  20  Temp:  98.2 F (36.8 C)  SpO2: 97% 100%   No intake/output data recorded.  Intake/Output Summary (Last 24 hours) at 10/21/2021 0917 Last data filed at 10/20/2021 1954 Gross per 24 hour  Intake --  Output 1700 ml  Net -1700 ml   General: well-appearing, no acute distress HEENT: anicteric sclera, MMM, hoarse voice CV: normal rate, +systolic murmur Lungs: diminished breath sounds bibasilar Abd: soft, non-tender, non-distended Skin: no visible lesions or rashes Ext: 1+ pitting edema b/l LEs Neuro: normal speech, no gross focal deficits  Dialysis access: RUE AVF +b/t  Test Results Reviewed Lab Results  Component Value Date   NA 136 10/21/2021   K 3.6 10/21/2021   CL 97 (L) 10/21/2021   CO2 27 10/21/2021   BUN 26 (H) 10/21/2021   CREATININE 2.61  (H) 10/21/2021   CALCIUM 9.6 10/21/2021   ALBUMIN 4.0 10/20/2021   PHOS 3.1 10/20/2021    I have reviewed relevant outside healthcare records

## 2021-10-21 NOTE — Progress Notes (Signed)
Took over patient care for primary nurse at this time. Patient requested breathing treatment. Called and spoke to RT in regards.

## 2021-10-21 NOTE — TOC Initial Note (Signed)
Transition of Care [facility]) - Initial/Assessment Note    Patient Details  [name] MRN: [id number] Date of Birth: 1940/11/26  Transition of Care [facility]) CM/SW Contact:    Ihor Gully, LCSW Phone Number: 10/21/2021, 2:50 PM  Clinical Narrative:                 Patient has been a resident at [facility] since 03/07/21 and is LTC. Niece, [name], confirms that patient is to discharge back to [facility]. Advised that patient may d/c Saturday.  Expected Discharge Plan: Skilled Nursing Facility Barriers to Discharge: Continued Medical Work up   Patient Goals and CMS Choice Patient states their goals for this hospitalization and ongoing recovery are:: return to facility      Expected Discharge Plan and Services Expected Discharge Plan: [place]       Living arrangements for the past 2 months: [place]                                      Prior Living Arrangements/Services Living arrangements for the past 2 months: [place] Lives with:: Facility Resident                   Activities of Daily Living Home Assistive Devices/Equipment: Engineer, drilling Lift ADL Screening (condition at time of admission) Patient's cognitive ability adequate to safely complete daily activities?: Yes Is the patient deaf or have difficulty hearing?: No Does the patient have difficulty seeing, even when wearing glasses/contacts?: No Does the patient have difficulty concentrating, remembering, or making decisions?: No Patient able to express need for assistance with ADLs?: Yes Does the patient have difficulty dressing or bathing?: Yes Independently performs ADLs?: No Communication: Independent Dressing (OT): Dependent Is this a change from baseline?: Pre-admission baseline Grooming: Needs assistance Is this a change from baseline?: Pre-admission baseline Feeding: Independent Bathing: Dependent Is this a change from baseline?: Pre-admission  baseline Toileting: Dependent Is this a change from baseline?: Pre-admission baseline In/Out Bed: Dependent Is this a change from baseline?: Pre-admission baseline Walks in Home: Dependent Is this a change from baseline?: Pre-admission baseline Does the patient have difficulty walking or climbing stairs?: Yes Weakness of Legs: Both Weakness of Arms/Hands: Both  Permission Sought/Granted Permission sought to share information with : Family Supports    Share Information with NAME: niece, [name]           Emotional Assessment              Admission diagnosis:  Acute respiratory failure with hypoxia ([place]) [J96.01] Acute on chronic respiratory failure with hypoxia ([place]) [J96.21] Acute congestive heart failure, unspecified heart failure type [facility]) [I50.9] Patient Active Problem List   Diagnosis Date Noted   Acute on chronic respiratory failure with hypoxia ([place]) 10/20/2021   Renal failure 09/07/2021   Hypotension 08/10/2021   Class 1 obesity 07/26/2021   COPD (chronic obstructive pulmonary disease) ([place]) 07/25/2021   Chest pain 07/02/2021   Pericardial friction rub 06/03/2021   Acute on chronic respiratory failure with hypoxia and hypercapnia ([place]) 06/01/2021   Persistent atrial fibrillation ([place]) 06/01/2021   Thrombocytopenia ([place]) 05/31/2021   Acute hematogenous osteomyelitis of right foot ([place]) 05/23/2021   PAD (peripheral artery disease) ([place]) 05/23/2021   Leg pain 20/25/4270   Acute metabolic encephalopathy 62/37/6283   Sinus bradycardia 04/07/2021   Hypokalemia 04/05/2021   History of CVA (cerebrovascular accident) 04/04/2021  Acute osteomyelitis of left foot ([place]) 04/04/2021   Anemia of chronic kidney failure 04/04/2021   Hyponatremia 04/04/2021   Memory loss 04/04/2021   Pressure injury of skin 03/04/2021   Rectal bleeding 12/27/2019   NSVT (nonsustained ventricular tachycardia) ([place]) 10/27/2019   Megaloblastic anemia 10/26/2019   Chronic blood loss  anemia 10/26/2017   Iron deficiency anemia 02/23/2017   Acute on chronic diastolic CHF (congestive heart failure) ([place]) 10/15/2016   Type 2 diabetes mellitus with nephropathy ([place]) 10/15/2016   ESRD on dialysis ([place]) 10/15/2016   NSTEMI (non-ST elevated myocardial infarction) ([place]) 10/03/2015   Anemia in chronic kidney disease, Stage IV 06/05/2015   TIA (transient ischemic attack) 09/02/2014   Gait difficulty 09/02/2014   Other specified transient cerebral ischemias    Stroke with cerebral ischemia ([place])    Hemispheric carotid artery syndrome    Essential hypertension    PCP:  Monico Blitz, MD Pharmacy:   [facility], [place] [address] Phone: [phone] Fax: [phone]     Social Determinants of Health (SDOH) Interventions    Readmission Risk Interventions    08/15/2021   11:49 AM 06/07/2021    2:47 PM 06/01/2021   10:59 AM  Readmission Risk Prevention Plan  Transportation Screening Complete Complete Complete  Medication Review (RN Care Manager)  Complete Complete  PCP or Specialist appointment within 3-5 days of discharge  Complete   HRI or Home Care Consult Complete Complete Complete  SW Recovery Care/Counseling Consult Complete Complete Complete  Palliative Care Screening Not Applicable Complete Not Applicable  Skilled Nursing Facility Complete Complete Complete

## 2021-10-21 NOTE — Assessment & Plan Note (Signed)
BMI 32.62 Lifestyle modification

## 2021-10-21 NOTE — NC FL2 (Signed)
[place] LEVEL OF CARE SCREENING TOOL     IDENTIFICATION  Patient [name] Birthdate: [birth year] Sex: female Admission Date (Current Location): 10/20/2021  [facility] [place] Number:  [employer] and Address:         Provider Number: [id number]  Attending Physician Name and Address:  Orson Eva, MD  Relative Name and Phone Number:  [name] (Niece)   [phone]    Current Level of Care: Hospital (obs) Recommended Level of Care: [place] Prior Approval Number:    Date Approved/Denied:   PASRR Number:    Discharge Plan: SNF    Current Diagnoses: Patient Active Problem List   Diagnosis Date Noted   Acute on chronic respiratory failure with hypoxia ([place]) 10/20/2021   Renal failure 09/07/2021   Hypotension 08/10/2021   Class 1 obesity 07/26/2021   COPD (chronic obstructive pulmonary disease) ([place]) 07/25/2021   Chest pain 07/02/2021   Pericardial friction rub 06/03/2021   Acute on chronic respiratory failure with hypoxia and hypercapnia ([place]) 06/01/2021   Persistent atrial fibrillation ([place]) 06/01/2021   Thrombocytopenia ([place]) 05/31/2021   Acute hematogenous osteomyelitis of right foot ([place]) 05/23/2021   PAD (peripheral artery disease) ([place]) 05/23/2021   Leg pain 08/13/1599   Acute metabolic encephalopathy 09/32/3557   Sinus bradycardia 04/07/2021   Hypokalemia 04/05/2021   History of CVA (cerebrovascular accident) 04/04/2021   Acute osteomyelitis of left foot ([place]) 04/04/2021   Anemia of chronic kidney failure 04/04/2021   Hyponatremia 04/04/2021   Memory loss 04/04/2021   Pressure injury of skin 03/04/2021   Rectal bleeding 12/27/2019   NSVT (nonsustained ventricular tachycardia) ([place]) 10/27/2019   Megaloblastic anemia 10/26/2019   Chronic blood loss anemia 10/26/2017   Iron deficiency anemia 02/23/2017   Acute on chronic diastolic CHF (congestive heart failure) ([place]) 10/15/2016   Type 2 diabetes  mellitus with nephropathy ([place]) 10/15/2016   ESRD on dialysis ([place]) 10/15/2016   NSTEMI (non-ST elevated myocardial infarction) ([place]) 10/03/2015   Anemia in chronic kidney disease, Stage IV 06/05/2015   TIA (transient ischemic attack) 09/02/2014   Gait difficulty 09/02/2014   Other specified transient cerebral ischemias    Stroke with cerebral ischemia (HCC)    Hemispheric carotid artery syndrome    Essential hypertension     Orientation RESPIRATION BLADDER Height & Weight     Self, Situation, Place  O2 (4L) Continent Weight: 178 lb 5.6 oz (80.9 kg) (Yesterdays weight was taken in dialysis with a different set of scales) Height:  '5\' 2"'$  (157.5 cm)  BEHAVIORAL SYMPTOMS/MOOD NEUROLOGICAL BOWEL NUTRITION STATUS      Continent Diet (renal/carb modified. 1230m fluid restriction)  AMBULATORY STATUS COMMUNICATION OF NEEDS Skin   Limited Assist Verbally Normal                       Personal Care Assistance Level of Assistance  Bathing, Feeding, Dressing Bathing Assistance: Limited assistance Feeding assistance: Independent Dressing Assistance: Limited assistance     Functional Limitations Info  Sight, Speech, Hearing Sight Info: Adequate Hearing Info: Adequate Speech Info: Adequate    SPECIAL CARE FACTORS FREQUENCY                       Contractures Contractures Info: Not present    Additional Factors Info  Code Status, Allergies Code Status Info: Full Code Allergies Info: Codeine           Current Medications (10/21/2021):  This is the  current hospital active medication list Current Facility-Administered Medications  Medication Dose Route Frequency Provider Last Rate Last Admin   acetaminophen (TYLENOL) tablet 650 mg  650 mg Oral Q6H PRN Emokpae, Ejiroghene E, MD   650 mg at 10/21/21 1404   Or   acetaminophen (TYLENOL) suppository 650 mg  650 mg Rectal Q6H PRN Emokpae, Ejiroghene E, MD       alteplase (CATHFLO ACTIVASE) injection 2 mg  2 mg Intracatheter  Once PRN Gean Quint, MD       anticoagulant sodium citrate solution 5 mL  5 mL Intracatheter PRN Gean Quint, MD       apixaban Arne [place]) tablet 2.5 mg  2.5 mg Oral BID Emokpae, Ejiroghene E, MD   2.5 mg at 10/21/21 0906   atorvastatin (LIPITOR) tablet 40 mg  40 mg Oral QHS Emokpae, Ejiroghene E, MD   40 mg at 10/20/21 2125   [START ON 10/22/2021] calcitRIOL (ROCALTROL) capsule 0.5 mcg  0.5 mcg Oral Q T,Th,Sa-HD Gean Quint, MD       Chlorhexidine Gluconate Cloth 2 % PADS 6 each  6 each Topical Q0600 Emokpae, Ejiroghene E, MD   6 each at 10/21/21 0608   [START ON 10/22/2021] cinacalcet (SENSIPAR) tablet 30 mg  30 mg Oral Q T,Th,Sa-HD Gean Quint, MD       feeding supplement (NEPRO CARB STEADY) liquid 237 mL  237 mL Oral BID BM Emokpae, Ejiroghene E, MD   237 mL at 10/21/21 0907   heparin injection 1,000 Units  1,000 Units Intracatheter PRN Gean Quint, MD       ipratropium-albuterol (DUONEB) 0.5-2.5 (3) MG/3ML nebulizer solution 3 mL  3 mL Nebulization Q4H PRN Adefeso, Oladapo, DO   3 mL at 10/21/21 1457   lidocaine (PF) (XYLOCAINE) 1 % injection 5 mL  5 mL Intradermal PRN Gean Quint, MD       lidocaine-prilocaine (EMLA) cream 1 Application  1 Application Topical PRN Gean Quint, MD       ondansetron (ZOFRAN) tablet 4 mg  4 mg Oral Q6H PRN Emokpae, Ejiroghene E, MD       Or   ondansetron (ZOFRAN) injection 4 mg  4 mg Intravenous Q6H PRN Emokpae, Ejiroghene E, MD       pentafluoroprop-tetrafluoroeth (GEBAUERS) aerosol 1 Application  1 Application Topical PRN Gean Quint, MD       sevelamer carbonate (RENVELA) tablet 800 mg  800 mg Oral TID WC Emokpae, Ejiroghene E, MD   800 mg at 10/21/21 1211     Discharge Medications: Please see discharge summary for a list of discharge medications.  Relevant Imaging Results:  Relevant Lab Results:   Additional Information [id number],  Ihor Gully, LCSW

## 2021-10-21 NOTE — Progress Notes (Signed)
Responded to nursing call:  sob   Subjective: Pt states she is a little more sob than earlier this afternoon.  Denies f/c, cp.n/v  Vitals:   10/21/21 0748 10/21/21 1113 10/21/21 1219 10/21/21 1457  BP: 97/67  100/73   Pulse: 94  65   Resp: 20  20   Temp: 98.3 F (36.8 C)  98 F (36.7 C)   TempSrc:   Oral   SpO2: 97% 97% 97% 97%  Weight:      Height:       CV--IRRR Lung--bibasilar crackles; basilar wheeze Abd--soft+BS/NT   Assessment/Plan: Dyspnea -RR20--97% on 4L -had HD 9/7>>1.7L removed; also had her usual HD at outpt center on 9/7 (cut 20 min short) -component of COPD exac -give prn duoneb -start brovana -give solumedrol 125 mg IV x 1 -personally reviewed tele--afib HR 95-105 -get EKG     Orson Eva, DO [facility]

## 2021-10-21 NOTE — Progress Notes (Signed)
Gave albuterol with duoneb , increased oxygen to 5 liters , has wheezes mainly on left , increased work of breathing.

## 2021-10-21 NOTE — Care Management Obs Status (Signed)
[place] NOTIFICATION   Patient Details  [name] MRN: [id number] Date of Birth: 1940/08/06   Medicare Observation Status Notification Given:  Yes    Ihor Gully, LCSW 10/21/2021, 3:55 PM

## 2021-10-21 NOTE — Progress Notes (Signed)
PROGRESS NOTE  Jody [name] UXN:[id number] DOB: April 19, 1940 DOA: 10/20/2021 PCP: Monico Blitz, MD  Brief History:  81 year old female with history of stroke, COPD, chronic respiratory failure on 3 L, systolic and diastolic CHF, persistent atrial fibrillation, ESRD, iron deficiency presenting from her nursing facility [facility]) from which she is a permanent resident secondary to shortness of breath.  The patient had dialysis on 10/20/2021.  The dialysis was cut short by about 20 to 30 minutes secondary to her leg pain.  The patient had worsening shortness of breath after dialysis.  She reported a cough with white sputum.  Apparently she was hypoxic with saturation down into the 70s on her usual 3 L.  She was placed on 5 L with improving saturation up to 93% and brought to the emergency department for further evaluation.  The patient had a similar admission from 09/06/2021 to 09/08/2021 where she was fluid overloaded.  The patient states that she has been compliant with her fluid restriction.  However her family does bring her carryout food regularly each week. The patient denies any chest pain, fever, chills, nausea, vomiting or diarrhea, abdominal pain. In the ED, the patient was afebrile hemodynamically stable.  Oxygen saturation was 93% on 5 L.  WBC 5.4, hemoglobin 9.9, platelets 160,000.  Chest x-ray showed pulmonary edema.  EKG showed atrial fibrillation with nonspecific T wave changes.  Troponin 50>> 24.  Nephrology was consulted, and the patient underwent ultrafiltration on the evening of 10/20/2021 removing 1.7 L with improvement of her respiratory status.  Nephrology was consulted to assist with management.    Assessment and Plan: * Acute on chronic respiratory failure with hypoxia (HCC) O2 sats down to 74% on home 3 L, placed on 5 L sats 93% currently.  Likely from volume overload.  Chest x-ray showing mild interstitial edema.   ESRD on dialysis [facility]) Schedule Tuesday Thursday  Saturday.   Reports compliance with her HD sessions.  The session was terminated 20 minutes before this to be completed due to complaints of pain. -Resume Renal meds. -Appreciate nephrology  Acute on chronic combined systolic and diastolic CHF (congestive heart failure) ([place]) Was above her usual dry weight - Manage volume with HD -06/03/21 Echo EF 50%, RV overload, severe TR -08/02/20 Echo EF 45-50% HK inferoseptal, inferolateral, Inf wall; mod TR -personally reviewed CXR 4/21--increased interstitial markings Ultrafiltration 1.7 L on 10/20/2021 Next dialysis 10/22/2021  Persistent atrial fibrillation (HCC) On anticoagulation with Eliquis. Rate Controlled, heart rate 50s to 90s.  Not on rate limiting agents. - Resume Eliquis  Essential hypertension Previously on metoprolol which has been discontinued BPs remain stable  Type 2 diabetes mellitus with nephropathy (HCC) A1c 5.7.  Not on medication.   Thrombocytopenia ([place]) Fairly chronic dating back to Feb 2023 -B12--1435 -folate-7.0 -TSH--1.132  Class 1 obesity BMI 32.62 Left eye modification  COPD (chronic obstructive pulmonary disease) (HCC) Stable.  Acute on chronic respiratory failure with hypoxia and hypercapnia (HCC) Secondary to pulmonary edema/fluid overload Initially on 5 L nasal cannula Chronic on 3 L  Hypokalemia Potassium 3.1.  Normal magnesium 2.4. -Supplemented in the ED, and per HD    Family Communication:   sisters updated at bedside 9/8  Consultants:  renal  Code Status:  FULL   DVT Prophylaxis: apixaban   Procedures: As Listed in Progress Note Above      Subjective: Patient denies fevers, chills, headache, chest pain, dyspnea, nausea, vomiting, diarrhea, abdominal pain, dysuria, hematuria,  hematochezia, and melena.   Objective: Vitals:   10/21/21 0500 10/21/21 1113 10/21/21 1219 10/21/21 1457  BP: 103/66  100/73   Pulse: 76  65   Resp: 20  20   Temp: 98.2 F (36.8 C)  98 F (36.7 C)    TempSrc: Tympanic  Oral   SpO2: 100% 97% 97% 97%  Weight: 80.9 kg     Height:        Intake/Output Summary (Last 24 hours) at 10/21/2021 1712 Last data filed at 10/21/2021 1300 Gross per 24 hour  Intake 240 ml  Output 1700 ml  Net -1460 ml   Weight change:  Exam:  General:  Pt is alert, follows commands appropriately, not in acute distress HEENT: No icterus, No thrush, No neck mass, [place]/AT Cardiovascular: IRRR, S1/S2, no rubs, no gallops Respiratory: bibasilar rales Abdomen: Soft/+BS, non tender, non distended, no guarding Extremities: 1  +LE edema, No lymphangitis, No petechiae, No rashes, no synovitis   Data Reviewed: I have personally reviewed following labs and imaging studies Basic Metabolic Panel: Recent Labs  Lab 10/20/21 1441 10/20/21 2040 10/21/21 0446  NA 133* 135 136  K 3.1* 3.4* 3.6  CL 96* 96* 97*  CO2 '27 28 27  '$ GLUCOSE 134* 115* 108*  BUN 28* 22 26*  CREATININE 2.66* 2.23* 2.61*  CALCIUM 9.2 9.2 9.6  MG 2.4  --   --   PHOS  --  3.1  --    Liver Function Tests: Recent Labs  Lab 10/20/21 1441 10/20/21 2040  AST 16  --   ALT 15  --   ALKPHOS 181*  --   BILITOT 1.2  --   PROT 6.9  --   ALBUMIN 3.7 4.0   No results for input(s): "LIPASE", "AMYLASE" in the last 168 hours. No results for input(s): "AMMONIA" in the last 168 hours. Coagulation Profile: No results for input(s): "INR", "PROTIME" in the last 168 hours. CBC: Recent Labs  Lab 10/20/21 1441 10/20/21 2040  WBC 5.4 4.3  NEUTROABS 4.2  --   HGB 9.9* 10.1*  HCT 34.3* 34.7*  MCV 110.6* 109.8*  PLT 160 139*   Cardiac Enzymes: No results for input(s): "CKTOTAL", "CKMB", "CKMBINDEX", "TROPONINI" in the last 168 hours. BNP: Invalid input(s): "POCBNP" CBG: Recent Labs  Lab 10/21/21 0704  GLUCAP 134*   HbA1C: No results for input(s): "HGBA1C" in the last 72 hours. Urine analysis:    Component Value Date/Time   COLORURINE YELLOW 05/27/2014 1902   APPEARANCEUR CLEAR 05/27/2014  1902   LABSPEC 1.012 05/27/2014 1902   PHURINE 5.0 05/27/2014 1902   GLUCOSEU NEGATIVE 05/27/2014 1902   HGBUR NEGATIVE 05/27/2014 1902   BILIRUBINUR NEGATIVE 05/27/2014 1902   KETONESUR NEGATIVE 05/27/2014 1902   PROTEINUR 100 (A) 05/27/2014 1902   UROBILINOGEN 0.2 05/27/2014 1902   NITRITE NEGATIVE 05/27/2014 1902   LEUKOCYTESUR NEGATIVE 05/27/2014 1902   Sepsis Labs: '@LABRCNTIP'$ (procalcitonin:4,lacticidven:4) ) Recent Results (from the past 240 hour(s))  Resp Panel by RT-PCR (Flu A&B, Covid) Anterior Nasal Swab     Status: None   Collection Time: 10/20/21  2:19 PM   Specimen: Anterior Nasal Swab  Result Value Ref Range Status   SARS Coronavirus 2 by RT PCR NEGATIVE NEGATIVE Final    Comment: (NOTE) SARS-CoV-2 target nucleic acids are NOT DETECTED.  The SARS-CoV-2 RNA is generally detectable in upper respiratory specimens during the acute phase of infection. The lowest concentration of SARS-CoV-2 viral copies this assay can detect is 138 copies/mL. A negative result  does not preclude SARS-Cov-2 infection and should not be used as the sole basis for treatment or other patient management decisions. A negative result may occur with  improper specimen collection/handling, submission of specimen other than nasopharyngeal swab, presence of viral mutation(s) within the areas targeted by this assay, and inadequate number of viral copies(<138 copies/mL). A negative result must be combined with clinical observations, patient history, and epidemiological information. The expected result is Negative.  Fact Sheet for Patients:  EntrepreneurPulse.com.au  Fact Sheet for Healthcare Providers:  IncredibleEmployment.be  This test is no t yet approved or cleared by the [place] FDA and  has been authorized for detection and/or diagnosis of SARS-CoV-2 by FDA under an Emergency Use Authorization (EUA). This EUA will remain  in effect (meaning this  test can be used) for the duration of the COVID-19 declaration under Section 564(b)(1) of the Act, 21 U.S.C.section 360bbb-3(b)(1), unless the authorization is terminated  or revoked sooner.       Influenza A by PCR NEGATIVE NEGATIVE Final   Influenza B by PCR NEGATIVE NEGATIVE Final    Comment: (NOTE) The Xpert Xpress SARS-CoV-2/FLU/RSV plus assay is intended as an aid in the diagnosis of influenza from Nasopharyngeal swab specimens and should not be used as a sole basis for treatment. Nasal washings and aspirates are unacceptable for Xpert Xpress SARS-CoV-2/FLU/RSV testing.  Fact Sheet for Patients: EntrepreneurPulse.com.au  Fact Sheet for Healthcare Providers: IncredibleEmployment.be  This test is not yet approved or cleared by the [place] FDA and has been authorized for detection and/or diagnosis of SARS-CoV-2 by FDA under an Emergency Use Authorization (EUA). This EUA will remain in effect (meaning this test can be used) for the duration of the COVID-19 declaration under Section 564(b)(1) of the Act, 21 U.S.C. section 360bbb-3(b)(1), unless the authorization is terminated or revoked.  Performed at [facility], [address], [place], [place] [address]      Scheduled Meds:  apixaban  2.5 mg Oral BID   atorvastatin  40 mg Oral QHS   [START ON 10/22/2021] calcitRIOL  0.5 mcg Oral Q T,Th,Sa-HD   Chlorhexidine Gluconate Cloth  6 each Topical Q0600   [START ON 10/22/2021] cinacalcet  30 mg Oral Q T,Th,Sa-HD   feeding supplement (NEPRO CARB STEADY)  237 mL Oral BID BM   sevelamer carbonate  800 mg Oral TID WC   Continuous Infusions:  anticoagulant sodium citrate      Procedures/Studies: DG Chest Portable 1 View  Result Date: 10/20/2021 CLINICAL DATA:  Chest pain. Shortness of breath. Symptoms began during dialysis. EXAM: PORTABLE CHEST 1 VIEW COMPARISON:  One-view chest x-ray 09/06/2021 FINDINGS: The heart is large. Atherosclerotic  calcifications are present at the aortic arch. Mild diffuse interstitial pattern is present. No significant scratched at no focal airspace consolidation is present. Chronic elevation of the right hemidiaphragm is noted. Lung volumes are low. Marked degenerative changes are present in both shoulders. IMPRESSION: 1. Cardiomegaly with mild interstitial edema. 2. No focal airspace disease. Electronically Signed   By: San Morelle M.D.   On: 10/20/2021 14:42    Orson Eva, DO  [facility]  If 7PM-7AM, please contact night-coverage [email or web address] Password [email or web address] 10/21/2021, 5:12 PM   LOS: 0 days

## 2021-10-21 NOTE — Assessment & Plan Note (Signed)
Secondary to pulmonary edema/fluid overload Initially on 5 L nasal cannula Chronic on 3 L

## 2021-10-21 NOTE — Assessment & Plan Note (Signed)
Fairly chronic dating back to Feb 2023 -B12--1435 -folate-7.0 -TSH--1.132

## 2021-10-22 DIAGNOSIS — Z20822 Contact with and (suspected) exposure to covid-19: Secondary | ICD-10-CM | POA: Diagnosis not present

## 2021-10-22 DIAGNOSIS — J9622 Acute and chronic respiratory failure with hypercapnia: Secondary | ICD-10-CM

## 2021-10-22 DIAGNOSIS — J441 Chronic obstructive pulmonary disease with (acute) exacerbation: Secondary | ICD-10-CM | POA: Diagnosis present

## 2021-10-22 DIAGNOSIS — I4819 Other persistent atrial fibrillation: Secondary | ICD-10-CM | POA: Diagnosis not present

## 2021-10-22 DIAGNOSIS — J9621 Acute and chronic respiratory failure with hypoxia: Secondary | ICD-10-CM | POA: Diagnosis not present

## 2021-10-22 DIAGNOSIS — J9601 Acute respiratory failure with hypoxia: Secondary | ICD-10-CM | POA: Diagnosis present

## 2021-10-22 DIAGNOSIS — N186 End stage renal disease: Secondary | ICD-10-CM | POA: Diagnosis not present

## 2021-10-22 DIAGNOSIS — Z87891 Personal history of nicotine dependence: Secondary | ICD-10-CM | POA: Diagnosis not present

## 2021-10-22 DIAGNOSIS — F419 Anxiety disorder, unspecified: Secondary | ICD-10-CM | POA: Diagnosis present

## 2021-10-22 DIAGNOSIS — D696 Thrombocytopenia, unspecified: Secondary | ICD-10-CM | POA: Diagnosis not present

## 2021-10-22 DIAGNOSIS — D631 Anemia in chronic kidney disease: Secondary | ICD-10-CM | POA: Diagnosis not present

## 2021-10-22 DIAGNOSIS — E876 Hypokalemia: Secondary | ICD-10-CM | POA: Diagnosis present

## 2021-10-22 DIAGNOSIS — E669 Obesity, unspecified: Secondary | ICD-10-CM | POA: Diagnosis not present

## 2021-10-22 DIAGNOSIS — I5031 Acute diastolic (congestive) heart failure: Secondary | ICD-10-CM | POA: Diagnosis not present

## 2021-10-22 DIAGNOSIS — Z66 Do not resuscitate: Secondary | ICD-10-CM | POA: Diagnosis not present

## 2021-10-22 DIAGNOSIS — Z6832 Body mass index (BMI) 32.0-32.9, adult: Secondary | ICD-10-CM | POA: Diagnosis not present

## 2021-10-22 DIAGNOSIS — Z803 Family history of malignant neoplasm of breast: Secondary | ICD-10-CM | POA: Diagnosis not present

## 2021-10-22 DIAGNOSIS — E1122 Type 2 diabetes mellitus with diabetic chronic kidney disease: Secondary | ICD-10-CM | POA: Diagnosis not present

## 2021-10-22 DIAGNOSIS — N2581 Secondary hyperparathyroidism of renal origin: Secondary | ICD-10-CM | POA: Diagnosis present

## 2021-10-22 DIAGNOSIS — Z992 Dependence on renal dialysis: Secondary | ICD-10-CM | POA: Diagnosis not present

## 2021-10-22 DIAGNOSIS — I132 Hypertensive heart and chronic kidney disease with heart failure and with stage 5 chronic kidney disease, or end stage renal disease: Secondary | ICD-10-CM | POA: Diagnosis not present

## 2021-10-22 DIAGNOSIS — Z8249 Family history of ischemic heart disease and other diseases of the circulatory system: Secondary | ICD-10-CM | POA: Diagnosis not present

## 2021-10-22 DIAGNOSIS — E7800 Pure hypercholesterolemia, unspecified: Secondary | ICD-10-CM | POA: Diagnosis present

## 2021-10-22 DIAGNOSIS — I5043 Acute on chronic combined systolic (congestive) and diastolic (congestive) heart failure: Secondary | ICD-10-CM | POA: Diagnosis not present

## 2021-10-22 DIAGNOSIS — Z7901 Long term (current) use of anticoagulants: Secondary | ICD-10-CM | POA: Diagnosis not present

## 2021-10-22 DIAGNOSIS — Z8673 Personal history of transient ischemic attack (TIA), and cerebral infarction without residual deficits: Secondary | ICD-10-CM | POA: Diagnosis not present

## 2021-10-22 LAB — CBC
HCT: 33.7 % — ABNORMAL LOW (ref 36.0–46.0)
Hemoglobin: 10 g/dL — ABNORMAL LOW (ref 12.0–15.0)
MCH: 32.4 pg (ref 26.0–34.0)
MCHC: 29.7 g/dL — ABNORMAL LOW (ref 30.0–36.0)
MCV: 109.1 fL — ABNORMAL HIGH (ref 80.0–100.0)
Platelets: 132 10*3/uL — ABNORMAL LOW (ref 150–400)
RBC: 3.09 MIL/uL — ABNORMAL LOW (ref 3.87–5.11)
RDW: 17.2 % — ABNORMAL HIGH (ref 11.5–15.5)
WBC: 5.4 10*3/uL (ref 4.0–10.5)
nRBC: 0 % (ref 0.0–0.2)

## 2021-10-22 LAB — RENAL FUNCTION PANEL
Albumin: 3.9 g/dL (ref 3.5–5.0)
Anion gap: 8 (ref 5–15)
BUN: 43 mg/dL — ABNORMAL HIGH (ref 8–23)
CO2: 28 mmol/L (ref 22–32)
Calcium: 10.4 mg/dL — ABNORMAL HIGH (ref 8.9–10.3)
Chloride: 97 mmol/L — ABNORMAL LOW (ref 98–111)
Creatinine, Ser: 3.57 mg/dL — ABNORMAL HIGH (ref 0.44–1.00)
GFR, Estimated: 12 mL/min — ABNORMAL LOW (ref >60)
Glucose, Bld: 188 mg/dL — ABNORMAL HIGH (ref 70–99)
Phosphorus: 5.9 mg/dL — ABNORMAL HIGH (ref 2.5–4.6)
Potassium: 4.5 mmol/L (ref 3.5–5.1)
Sodium: 133 mmol/L — ABNORMAL LOW (ref 135–145)

## 2021-10-22 LAB — GLUCOSE, CAPILLARY
Glucose-Capillary: 173 mg/dL — ABNORMAL HIGH (ref 70–99)
Glucose-Capillary: 188 mg/dL — ABNORMAL HIGH (ref 70–99)
Glucose-Capillary: 159 mg/dL — ABNORMAL HIGH (ref 70–99)

## 2021-10-22 LAB — MRSA NEXT GEN BY PCR, NASAL: MRSA by PCR Next Gen: NOT DETECTED

## 2021-10-22 MED ORDER — MIDODRINE HCL 5 MG PO TABS
ORAL_TABLET | ORAL | Status: AC
  Administered 2021-10-22: 10 mg via ORAL
  Filled 2021-10-22: qty 2

## 2021-10-22 MED ORDER — MELATONIN 3 MG PO TABS
6.0000 mg | ORAL_TABLET | Freq: Once | ORAL | Status: AC
  Administered 2021-10-23: 6 mg via ORAL
  Filled 2021-10-22: qty 2

## 2021-10-22 MED ORDER — IPRATROPIUM-ALBUTEROL 0.5-2.5 (3) MG/3ML IN SOLN
3.0000 mL | Freq: Four times a day (QID) | RESPIRATORY_TRACT | Status: DC
  Administered 2021-10-22 – 2021-10-24 (×7): 3 mL via RESPIRATORY_TRACT
  Filled 2021-10-22 (×7): qty 3

## 2021-10-22 MED ORDER — CALCITRIOL 0.25 MCG PO CAPS
ORAL_CAPSULE | ORAL | Status: AC
  Administered 2021-10-22: 0.5 ug via ORAL
  Filled 2021-10-22: qty 2

## 2021-10-22 MED ORDER — ALBUTEROL SULFATE (2.5 MG/3ML) 0.083% IN NEBU
INHALATION_SOLUTION | RESPIRATORY_TRACT | Status: AC
  Administered 2021-10-22: 2.5 mg
  Filled 2021-10-22: qty 3

## 2021-10-22 MED ORDER — HYDROXYZINE HCL 10 MG/5ML PO SYRP
10.0000 mg | ORAL_SOLUTION | Freq: Once | ORAL | Status: DC
  Filled 2021-10-22: qty 5

## 2021-10-22 MED ORDER — HYDROXYZINE HCL 10 MG PO TABS
10.0000 mg | ORAL_TABLET | Freq: Once | ORAL | Status: AC
  Administered 2021-10-22: 10 mg via ORAL
  Filled 2021-10-22: qty 1

## 2021-10-22 NOTE — Plan of Care (Signed)
  Problem: Education: Goal: Knowledge of General Education information will improve Description Including pain rating scale, medication(s)/side effects and non-pharmacologic comfort measures Outcome: Progressing   Problem: Health Behavior/Discharge Planning: Goal: Ability to manage health-related needs will improve Outcome: Progressing   

## 2021-10-22 NOTE — Progress Notes (Signed)
Patient calling out to desk stating that she can not breath. Went to reassure patient. Oxygen saturation was checked and she never dropped below 90% on 4L. Her respirations were up at 32. I did call respiratory to inquire about a prn  medication that she could possibly have

## 2021-10-22 NOTE — Procedures (Signed)
   HEMODIALYSIS TREATMENT NOTE:   Uneventful 3.5 hour heparin-free treatment completed using right upper arm AVF (15g/antegrade). Calcitriol and Midodrine given.  Goal met: 3.2 liters removed without interruption in UF.  All blood was returned and hemostasis was achieved in 20 minutes. No changes from pre-HD assessment.  Hand-off given to Santa Lighter, RN.   Rockwell Alexandria, RN

## 2021-10-22 NOTE — Progress Notes (Signed)
Patient transferred to Dialysis. 

## 2021-10-22 NOTE — Progress Notes (Signed)
Have increased oxygen to 6 liters as patient has increased work of breathing , this was after prn nebulizer given. Suspect patient is still fluid Positive. She get Dialysis today.

## 2021-10-22 NOTE — Progress Notes (Signed)
PROGRESS NOTE  Jody [name] MHD:[id number] DOB: 1940-05-19 DOA: 10/20/2021 PCP: Monico Blitz, MD  Brief History:  81 year old female with history of stroke, COPD, chronic respiratory failure on 3 L, systolic and diastolic CHF, persistent atrial fibrillation, ESRD, iron deficiency presenting from her nursing facility [facility]) from which she is a permanent resident secondary to shortness of breath.  The patient had dialysis on 10/20/2021.  The dialysis was cut short by about 20 to 30 minutes secondary to her leg pain.  The patient had worsening shortness of breath after dialysis.  She reported a cough with white sputum.  Apparently she was hypoxic with saturation down into the 70s on her usual 3 L.  She was placed on 5 L with improving saturation up to 93% and brought to the emergency department for further evaluation.  The patient had a similar admission from 09/06/2021 to 09/08/2021 where she was fluid overloaded.  The patient states that she has been compliant with her fluid restriction.  However her family does bring her carryout food regularly each week. The patient denies any chest pain, fever, chills, nausea, vomiting or diarrhea, abdominal pain. In the ED, the patient was afebrile hemodynamically stable.  Oxygen saturation was 93% on 5 L.  WBC 5.4, hemoglobin 9.9, platelets 160,000.  Chest x-ray showed pulmonary edema.  EKG showed atrial fibrillation with nonspecific T wave changes.  Troponin 50>> 24.  Nephrology was consulted, and the patient underwent ultrafiltration on the evening of 10/20/2021 removing 1.7 L with improvement of her respiratory status.  Nephrology was consulted to assist with management.      Assessment and Plan: * Acute on chronic respiratory failure with hypoxia and hypercapnia (HCC) Secondary to pulmonary edema/fluid overload and COPD exac Personally reviewed CXR--increased interstitial markings Initially up to 6 L nasal cannula Chronic on 3 L Now back on  3L>>100%  ESRD on dialysis [facility]) Schedule--Tuesday Thursday Saturday.   Reports compliance with her HD sessions.  The session was terminated 20 minutes before this to be completed due to complaints of pain. -Resume Renal meds. -Appreciate nephrology  Acute on chronic combined systolic and diastolic CHF (congestive heart failure) ([place]) Was above her usual dry weight - Manage volume with HD -06/03/21 Echo EF 50%, RV overload, severe TR -08/02/20 Echo EF 45-50% HK inferoseptal, inferolateral, Inf wall; mod TR -personally reviewed CXR 4/21--increased interstitial markings Ultrafiltration 1.7 L on 10/20/2021 dialysis 10/22/2021>>3.2L  Persistent atrial fibrillation ([place]) On anticoagulation with Eliquis. Rate Controlled, heart rate 50s to 90s.  Not on rate limiting agents. - Resume Eliquis  Essential hypertension Previously on metoprolol which has been discontinued BPs remain stable  Type 2 diabetes mellitus with nephropathy (HCC) A1c 5.7.  Not on medication.   Thrombocytopenia ([place]) Fairly chronic dating back to Feb 2023 -B12--1435 -folate-7.0 -TSH--1.132  Class 1 obesity BMI 32.62 Left eye modification  COPD (chronic obstructive pulmonary disease) (HCC) Stable.  Hypokalemia Potassium 3.1.  Normal magnesium 2.4. -Supplemented in the ED, and per HD     Family Communication:   sisters updated at bedside 9/8   Consultants:  renal   Code Status:  FULL    DVT Prophylaxis: apixaban     Procedures: As Listed in Progress Note Above        Subjective: Patient denies fevers, chills, headache, chest pain, dyspnea, nausea, vomiting, diarrhea, abdominal pain, dysuria, hematuria, hematochezia, and melena.   Objective: Vitals:   10/22/21 1500 10/22/21 1530 10/22/21 1547 10/22/21 1610  BP: 119/65 109/65 109/60 121/84  Pulse: 81 84 91 83  Resp: '18 19 18 20  '$ Temp:    98 F (36.7 C)  TempSrc:    Oral  SpO2:    100%  Weight:    79.6 kg  Height:        Intake/Output  Summary (Last 24 hours) at 10/22/2021 1716 Last data filed at 10/22/2021 1610 Gross per 24 hour  Intake --  Output 3200 ml  Net -3200 ml   Weight change: 7.7 kg Exam:  General:  Pt is alert, follows commands appropriately, not in acute distress HEENT: No icterus, No thrush, No neck mass, [place]/AT Cardiovascular: IRRR, S1/S2, no rubs, no gallops Respiratory: bibasilar crackles.  No wheeze Abdomen: Soft/+BS, non tender, non distended, no guarding Extremities: No edema, No lymphangitis, No petechiae, No rashes, no synovitis   Data Reviewed: I have personally reviewed following labs and imaging studies Basic Metabolic Panel: Recent Labs  Lab 10/20/21 1441 10/20/21 2040 10/21/21 0446 10/22/21 1228  NA 133* 135 136 133*  K 3.1* 3.4* 3.6 4.5  CL 96* 96* 97* 97*  CO2 '27 28 27 28  '$ GLUCOSE 134* 115* 108* 188*  BUN 28* 22 26* 43*  CREATININE 2.66* 2.23* 2.61* 3.57*  CALCIUM 9.2 9.2 9.6 10.4*  MG 2.4  --   --   --   PHOS  --  3.1  --  5.9*   Liver Function Tests: Recent Labs  Lab 10/20/21 1441 10/20/21 2040 10/22/21 1228  AST 16  --   --   ALT 15  --   --   ALKPHOS 181*  --   --   BILITOT 1.2  --   --   PROT 6.9  --   --   ALBUMIN 3.7 4.0 3.9   No results for input(s): "LIPASE", "AMYLASE" in the last 168 hours. No results for input(s): "AMMONIA" in the last 168 hours. Coagulation Profile: No results for input(s): "INR", "PROTIME" in the last 168 hours. CBC: Recent Labs  Lab 10/20/21 1441 10/20/21 2040 10/22/21 1228  WBC 5.4 4.3 5.4  NEUTROABS 4.2  --   --   HGB 9.9* 10.1* 10.0*  HCT 34.3* 34.7* 33.7*  MCV 110.6* 109.8* 109.1*  PLT 160 139* 132*   Cardiac Enzymes: No results for input(s): "CKTOTAL", "CKMB", "CKMBINDEX", "TROPONINI" in the last 168 hours. BNP: Invalid input(s): "POCBNP" CBG: Recent Labs  Lab 10/21/21 0704 10/21/21 2003 10/22/21 0727 10/22/21 1101 10/22/21 1701  GLUCAP 134* 177* 173* 188* 159*   HbA1C: No results for input(s): "HGBA1C" in  the last 72 hours. Urine analysis:    Component Value Date/Time   COLORURINE YELLOW 05/27/2014 1902   APPEARANCEUR CLEAR 05/27/2014 1902   LABSPEC 1.012 05/27/2014 1902   PHURINE 5.0 05/27/2014 1902   GLUCOSEU NEGATIVE 05/27/2014 1902   HGBUR NEGATIVE 05/27/2014 1902   BILIRUBINUR NEGATIVE 05/27/2014 1902   KETONESUR NEGATIVE 05/27/2014 1902   PROTEINUR 100 (A) 05/27/2014 1902   UROBILINOGEN 0.2 05/27/2014 1902   NITRITE NEGATIVE 05/27/2014 1902   LEUKOCYTESUR NEGATIVE 05/27/2014 1902   Sepsis Labs: '@LABRCNTIP'$ (procalcitonin:4,lacticidven:4) ) Recent Results (from the past 240 hour(s))  Resp Panel by RT-PCR (Flu A&B, Covid) Anterior Nasal Swab     Status: None   Collection Time: 10/20/21  2:19 PM   Specimen: Anterior Nasal Swab  Result Value Ref Range Status   SARS Coronavirus 2 by RT PCR NEGATIVE NEGATIVE Final    Comment: (NOTE) SARS-CoV-2 target nucleic acids are NOT DETECTED.  The SARS-CoV-2 RNA is generally detectable in upper respiratory specimens during the acute phase of infection. The lowest concentration of SARS-CoV-2 viral copies this assay can detect is 138 copies/mL. A negative result does not preclude SARS-Cov-2 infection and should not be used as the sole basis for treatment or other patient management decisions. A negative result may occur with  improper specimen collection/handling, submission of specimen other than nasopharyngeal swab, presence of viral mutation(s) within the areas targeted by this assay, and inadequate number of viral copies(<138 copies/mL). A negative result must be combined with clinical observations, patient history, and epidemiological information. The expected result is Negative.  Fact Sheet for Patients:  EntrepreneurPulse.com.au  Fact Sheet for Healthcare Providers:  IncredibleEmployment.be  This test is no t yet approved or cleared by the [place] FDA and  has been authorized for  detection and/or diagnosis of SARS-CoV-2 by FDA under an Emergency Use Authorization (EUA). This EUA will remain  in effect (meaning this test can be used) for the duration of the COVID-19 declaration under Section 564(b)(1) of the Act, 21 U.S.C.section 360bbb-3(b)(1), unless the authorization is terminated  or revoked sooner.       Influenza A by PCR NEGATIVE NEGATIVE Final   Influenza B by PCR NEGATIVE NEGATIVE Final    Comment: (NOTE) The Xpert Xpress SARS-CoV-2/FLU/RSV plus assay is intended as an aid in the diagnosis of influenza from Nasopharyngeal swab specimens and should not be used as a sole basis for treatment. Nasal washings and aspirates are unacceptable for Xpert Xpress SARS-CoV-2/FLU/RSV testing.  Fact Sheet for Patients: EntrepreneurPulse.com.au  Fact Sheet for Healthcare Providers: IncredibleEmployment.be  This test is not yet approved or cleared by the [place] FDA and has been authorized for detection and/or diagnosis of SARS-CoV-2 by FDA under an Emergency Use Authorization (EUA). This EUA will remain in effect (meaning this test can be used) for the duration of the COVID-19 declaration under Section 564(b)(1) of the Act, 21 U.S.C. section 360bbb-3(b)(1), unless the authorization is terminated or revoked.  Performed at [facility], [address] [place], [place] [address]      Scheduled Meds:  apixaban  2.5 mg Oral BID   atorvastatin  40 mg Oral QHS   calcitRIOL  0.5 mcg Oral Q T,Th,Sa-HD   Chlorhexidine Gluconate Cloth  6 each Topical Q0600   cinacalcet  30 mg Oral Q T,Th,Sa-HD   feeding supplement (NEPRO CARB STEADY)  237 mL Oral BID BM   ipratropium-albuterol  3 mL Nebulization Q6H   methylPREDNISolone (SOLU-MEDROL) injection  60 mg Intravenous Q12H   midodrine  10 mg Oral TID WC   sevelamer carbonate  800 mg Oral TID WC   Continuous Infusions:  anticoagulant sodium citrate       Procedures/Studies: DG Chest Portable 1 View  Result Date: 10/20/2021 CLINICAL DATA:  Chest pain. Shortness of breath. Symptoms began during dialysis. EXAM: PORTABLE CHEST 1 VIEW COMPARISON:  One-view chest x-ray 09/06/2021 FINDINGS: The heart is large. Atherosclerotic calcifications are present at the aortic arch. Mild diffuse interstitial pattern is present. No significant scratched at no focal airspace consolidation is present. Chronic elevation of the right hemidiaphragm is noted. Lung volumes are low. Marked degenerative changes are present in both shoulders. IMPRESSION: 1. Cardiomegaly with mild interstitial edema. 2. No focal airspace disease. Electronically Signed   By: San Morelle M.D.   On: 10/20/2021 14:42    Orson Eva, DO  [facility]  If 7PM-7AM, please contact night-coverage [email or web address] Password [facility] 10/22/2021, 5:16  PM   LOS: 0 days

## 2021-10-23 ENCOUNTER — Other Ambulatory Visit (HOSPITAL_COMMUNITY): Payer: Self-pay | Admitting: *Deleted

## 2021-10-23 ENCOUNTER — Inpatient Hospital Stay (HOSPITAL_COMMUNITY): Payer: Medicare Other

## 2021-10-23 DIAGNOSIS — I5043 Acute on chronic combined systolic (congestive) and diastolic (congestive) heart failure: Secondary | ICD-10-CM | POA: Diagnosis not present

## 2021-10-23 DIAGNOSIS — J441 Chronic obstructive pulmonary disease with (acute) exacerbation: Secondary | ICD-10-CM

## 2021-10-23 DIAGNOSIS — I5031 Acute diastolic (congestive) heart failure: Secondary | ICD-10-CM

## 2021-10-23 DIAGNOSIS — Z7189 Other specified counseling: Secondary | ICD-10-CM

## 2021-10-23 DIAGNOSIS — J9621 Acute and chronic respiratory failure with hypoxia: Secondary | ICD-10-CM | POA: Diagnosis not present

## 2021-10-23 DIAGNOSIS — E669 Obesity, unspecified: Secondary | ICD-10-CM | POA: Diagnosis not present

## 2021-10-23 LAB — ECHOCARDIOGRAM LIMITED
Calc EF: 47 %
Height: 67 in
S' Lateral: 3.5 cm
Single Plane A2C EF: 48.2 %
Single Plane A4C EF: 43 %
Weight: 2758.4 oz

## 2021-10-23 LAB — GLUCOSE, CAPILLARY
Glucose-Capillary: 152 mg/dL — ABNORMAL HIGH (ref 70–99)
Glucose-Capillary: 165 mg/dL — ABNORMAL HIGH (ref 70–99)
Glucose-Capillary: 224 mg/dL — ABNORMAL HIGH (ref 70–99)

## 2021-10-23 MED ORDER — BUDESONIDE 0.5 MG/2ML IN SUSP
RESPIRATORY_TRACT | Status: AC
  Filled 2021-10-23: qty 2

## 2021-10-23 MED ORDER — ARFORMOTEROL TARTRATE 15 MCG/2ML IN NEBU
15.0000 ug | INHALATION_SOLUTION | Freq: Two times a day (BID) | RESPIRATORY_TRACT | Status: DC
  Administered 2021-10-23 – 2021-10-24 (×3): 15 ug via RESPIRATORY_TRACT
  Filled 2021-10-23 (×3): qty 2

## 2021-10-23 MED ORDER — METOPROLOL TARTRATE 5 MG/5ML IV SOLN
2.5000 mg | Freq: Four times a day (QID) | INTRAVENOUS | Status: DC
  Administered 2021-10-23 – 2021-10-24 (×4): 2.5 mg via INTRAVENOUS
  Filled 2021-10-23 (×4): qty 5

## 2021-10-23 MED ORDER — BUDESONIDE 0.5 MG/2ML IN SUSP
0.5000 mg | Freq: Two times a day (BID) | RESPIRATORY_TRACT | Status: DC
  Administered 2021-10-23 – 2021-10-24 (×3): 0.5 mg via RESPIRATORY_TRACT
  Filled 2021-10-23 (×3): qty 2

## 2021-10-23 NOTE — Progress Notes (Signed)
PROGRESS NOTE  Jody [name] DSK:[id number] DOB: 08-18-1940 DOA: 10/20/2021 PCP: Monico Blitz, MD  Brief History:  81 year old female with history of stroke, COPD, chronic respiratory failure on 3 L, systolic and diastolic CHF, persistent atrial fibrillation, ESRD, iron deficiency presenting from her nursing facility [facility]) from which she is a permanent resident secondary to shortness of breath.  The patient had dialysis on 10/20/2021.  The dialysis was cut short by about 20 to 30 minutes secondary to her leg pain.  The patient had worsening shortness of breath after dialysis.  She reported a cough with white sputum.  Apparently she was hypoxic with saturation down into the 70s on her usual 3 L.  She was placed on 5 L with improving saturation up to 93% and brought to the emergency department for further evaluation.  The patient had a similar admission from 09/06/2021 to 09/08/2021 where she was fluid overloaded.  The patient states that she has been compliant with her fluid restriction.  However her family does bring her carryout food regularly each week. The patient denies any chest pain, fever, chills, nausea, vomiting or diarrhea, abdominal pain. In the ED, the patient was afebrile hemodynamically stable.  Oxygen saturation was 93% on 5 L.  WBC 5.4, hemoglobin 9.9, platelets 160,000.  Chest x-ray showed pulmonary edema.  EKG showed atrial fibrillation with nonspecific T wave changes.  Troponin 50>> 24.  Nephrology was consulted, and the patient underwent ultrafiltration on the evening of 10/20/2021 removing 1.7 L with improvement of her respiratory status.  Nephrology was consulted to assist with management. The patient underwent dialysis with fluid removal with overall improvement of her dyspnea.  However throughout the hospitalization, the patient would continue to have episodes of shortness of breath on her nondialysis days.  This was felt in part due to the patient's COPD, anxiety, and  continued hypervolemic state. The patient was also noted to have atrial fibrillation with RVR.  She was moved back to the stepdown unit.     Assessment and Plan: * Acute on chronic respiratory failure with hypoxia and hypercapnia (HCC) Secondary to pulmonary edema/fluid overload and COPD exac Personally reviewed CXR--increased interstitial markings Initially up to 6 L nasal cannula Chronic on 3 L --Patient continues to have intermittent episodes of dyspnea and tachypnea>> suspect this is being driven by underlying A-fib with RVR, COPD, anxiety, and continued hypervolemic state -- 9/10Personally reviewed cxr--chronic interstitial prominence without consolidation  ESRD on dialysis [facility]) Schedule--Tuesday Thursday Saturday.   Reports compliance with her HD sessions.  The session was terminated 20 minutes before this to be completed due to complaints of pain. -Resume Renal meds. -Appreciate nephrology  Acute on chronic combined systolic and diastolic CHF (congestive heart failure) ([place]) Was above her usual dry weight - Manage volume with HD -06/03/21 Echo EF 50%, RV overload, severe TR -08/02/20 Echo EF 45-50% HK inferoseptal, inferolateral, Inf wall; mod TR -personally reviewed CXR 4/21--increased interstitial markings Ultrafiltration 1.7 L on 10/20/2021 dialysis 10/22/2021>>3.2L -- Repeat limited echocardiogram  COPD with acute exacerbation (HCC) Continue DuoNebs and IV steroids Add Pulmicort and Brovana  Persistent atrial fibrillation ([place]) On anticoagulation with Eliquis. Rate Controlled, heart rate 50s to 90s.  Not on rate limiting agents. - Resume Eliquis 10/23/21--pt had RVR>>transfer to SDU  Essential hypertension Previously on metoprolol which has been discontinued BPs remain stable  Type 2 diabetes mellitus with nephropathy (HCC) A1c 5.7.  Not on medication.   Thrombocytopenia ([place]) Fairly  chronic dating back to Feb 2023 -B12--1435 -folate-7.0 -TSH--1.132  Goals of  care, counseling/discussion Discussed with the patient's POA, [name] -- I discussed the patient's overall poor prognosis given her frailty, multiple comorbid conditions, numerous hospitalizations and poor functional baseline -- I discussed that with the privilege of seeing the patient on numerous occasions, that the patient has continued to experience a functional decline with subsequent hospitalizations --Advance care planning, including the explanation and discussion of advance directives was carried out with the patient and [name].  Code status including explanations of "Full Code" and "DNR" and alternatives were discussed in detail.  Discussion of end-of-life issues including but not limited palliative care, hospice care and the concept of hospice, other end-of-life care options, power of attorney for health care decisions, living wills, and physician orders for life-sustaining treatment were also discussed. Total face to face time 36 minutes. --HPOA expressed understanding and agrees to transition patient to DNR status   Class 1 obesity BMI 32.62 Left eye modification  Hypokalemia Potassium 3.1.  Normal magnesium 2.4. -Supplemented in the ED, and per HD  Family Communication:   niece HPOA updated  9/10   Consultants:  renal   Code Status:  FULL    DVT Prophylaxis: apixaban     Procedures: As Listed in Progress Note Above              Subjective: Patient complains of shortness of breath.  She denies any chest pain, nausea, vomiting, diarrhea, abdominal pain.  She has a dry cough.  Objective: Vitals:   10/23/21 1119 10/23/21 1120 10/23/21 1121 10/23/21 1123  BP: (!) 167/110   (!) 127/93  Pulse: 84 (!) 124 (!) 114 74  Resp: (!) 35 (!) 34 (!) 31 20  Temp:    98.6 F (37 C)  TempSrc:    Oral  SpO2: 95% 98% 97% 95%  Weight:    78.2 kg  Height:    '5\' 7"'$  (1.702 m)    Intake/Output Summary (Last 24 hours) at 10/23/2021 1155 Last data filed at 10/22/2021 2200 Gross  per 24 hour  Intake 240 ml  Output 3200 ml  Net -2960 ml   Weight change: 0.1 kg Exam:  General:  Pt is alert, follows commands appropriately, not in acute distress HEENT: No icterus, No thrush, No neck mass, [place]/AT Cardiovascular: IRRR, S1/S2, no rubs, no gallops Respiratory: Bilateral rales.  Diminished breath sounds.  Bibasilar wheeze. Abdomen: Soft/+BS, non tender, non distended, no guarding Extremities: 1 +LE edema, No lymphangitis, No petechiae, No rashes, no synovitis   Data Reviewed: I have personally reviewed following labs and imaging studies Basic Metabolic Panel: Recent Labs  Lab 10/20/21 1441 10/20/21 2040 10/21/21 0446 10/22/21 1228  NA 133* 135 136 133*  K 3.1* 3.4* 3.6 4.5  CL 96* 96* 97* 97*  CO2 '27 28 27 28  '$ GLUCOSE 134* 115* 108* 188*  BUN 28* 22 26* 43*  CREATININE 2.66* 2.23* 2.61* 3.57*  CALCIUM 9.2 9.2 9.6 10.4*  MG 2.4  --   --   --   PHOS  --  3.1  --  5.9*   Liver Function Tests: Recent Labs  Lab 10/20/21 1441 10/20/21 2040 10/22/21 1228  AST 16  --   --   ALT 15  --   --   ALKPHOS 181*  --   --   BILITOT 1.2  --   --   PROT 6.9  --   --   ALBUMIN 3.7 4.0 3.9  No results for input(s): "LIPASE", "AMYLASE" in the last 168 hours. No results for input(s): "AMMONIA" in the last 168 hours. Coagulation Profile: No results for input(s): "INR", "PROTIME" in the last 168 hours. CBC: Recent Labs  Lab 10/20/21 1441 10/20/21 2040 10/22/21 1228  WBC 5.4 4.3 5.4  NEUTROABS 4.2  --   --   HGB 9.9* 10.1* 10.0*  HCT 34.3* 34.7* 33.7*  MCV 110.6* 109.8* 109.1*  PLT 160 139* 132*   Cardiac Enzymes: No results for input(s): "CKTOTAL", "CKMB", "CKMBINDEX", "TROPONINI" in the last 168 hours. BNP: Invalid input(s): "POCBNP" CBG: Recent Labs  Lab 10/22/21 0727 10/22/21 1101 10/22/21 1701 10/22/21 2118 10/23/21 0501  GLUCAP 173* 188* 159* 152* 165*   HbA1C: No results for input(s): "HGBA1C" in the last 72 hours. Urine analysis:     Component Value Date/Time   COLORURINE YELLOW 05/27/2014 1902   APPEARANCEUR CLEAR 05/27/2014 1902   LABSPEC 1.012 05/27/2014 1902   PHURINE 5.0 05/27/2014 1902   GLUCOSEU NEGATIVE 05/27/2014 1902   HGBUR NEGATIVE 05/27/2014 1902   BILIRUBINUR NEGATIVE 05/27/2014 1902   KETONESUR NEGATIVE 05/27/2014 1902   PROTEINUR 100 (A) 05/27/2014 1902   UROBILINOGEN 0.2 05/27/2014 1902   NITRITE NEGATIVE 05/27/2014 1902   LEUKOCYTESUR NEGATIVE 05/27/2014 1902   Sepsis Labs: '@LABRCNTIP'$ (procalcitonin:4,lacticidven:4) ) Recent Results (from the past 240 hour(s))  Resp Panel by RT-PCR (Flu A&B, Covid) Anterior Nasal Swab     Status: None   Collection Time: 10/20/21  2:19 PM   Specimen: Anterior Nasal Swab  Result Value Ref Range Status   SARS Coronavirus 2 by RT PCR NEGATIVE NEGATIVE Final    Comment: (NOTE) SARS-CoV-2 target nucleic acids are NOT DETECTED.  The SARS-CoV-2 RNA is generally detectable in upper respiratory specimens during the acute phase of infection. The lowest concentration of SARS-CoV-2 viral copies this assay can detect is 138 copies/mL. A negative result does not preclude SARS-Cov-2 infection and should not be used as the sole basis for treatment or other patient management decisions. A negative result may occur with  improper specimen collection/handling, submission of specimen other than nasopharyngeal swab, presence of viral mutation(s) within the areas targeted by this assay, and inadequate number of viral copies(<138 copies/mL). A negative result must be combined with clinical observations, patient history, and epidemiological information. The expected result is Negative.  Fact Sheet for Patients:  EntrepreneurPulse.com.au  Fact Sheet for Healthcare Providers:  IncredibleEmployment.be  This test is no t yet approved or cleared by the [place] FDA and  has been authorized for detection and/or diagnosis of SARS-CoV-2  by FDA under an Emergency Use Authorization (EUA). This EUA will remain  in effect (meaning this test can be used) for the duration of the COVID-19 declaration under Section 564(b)(1) of the Act, 21 U.S.C.section 360bbb-3(b)(1), unless the authorization is terminated  or revoked sooner.       Influenza A by PCR NEGATIVE NEGATIVE Final   Influenza B by PCR NEGATIVE NEGATIVE Final    Comment: (NOTE) The Xpert Xpress SARS-CoV-2/FLU/RSV plus assay is intended as an aid in the diagnosis of influenza from Nasopharyngeal swab specimens and should not be used as a sole basis for treatment. Nasal washings and aspirates are unacceptable for Xpert Xpress SARS-CoV-2/FLU/RSV testing.  Fact Sheet for Patients: EntrepreneurPulse.com.au  Fact Sheet for Healthcare Providers: IncredibleEmployment.be  This test is not yet approved or cleared by the [place] FDA and has been authorized for detection and/or diagnosis of SARS-CoV-2 by FDA under an Emergency Use Authorization (  EUA). This EUA will remain in effect (meaning this test can be used) for the duration of the COVID-19 declaration under Section 564(b)(1) of the Act, 21 U.S.C. section 360bbb-3(b)(1), unless the authorization is terminated or revoked.  Performed at [facility], [address] [place], [place] [address]   MRSA Next Gen by PCR, Nasal     Status: None   Collection Time: 10/22/21  4:30 PM   Specimen: Nasal Mucosa; Nasal Swab  Result Value Ref Range Status   MRSA by PCR Next Gen NOT DETECTED NOT DETECTED Final    Comment: (NOTE) The GeneXpert MRSA Assay (FDA approved for NASAL specimens only), is one component of a comprehensive MRSA colonization surveillance program. It is not intended to diagnose MRSA infection nor to guide or monitor treatment for MRSA infections. Test performance is not FDA approved in patients less than 60 years old. Performed at [facility], [address]  [place], [place] [address]      Scheduled Meds:  apixaban  2.5 mg Oral BID   arformoterol  15 mcg Nebulization BID   atorvastatin  40 mg Oral QHS   budesonide (PULMICORT) nebulizer solution  0.5 mg Nebulization BID   calcitRIOL  0.5 mcg Oral Q T,Th,Sa-HD   Chlorhexidine Gluconate Cloth  6 each Topical Q0600   cinacalcet  30 mg Oral Q T,Th,Sa-HD   feeding supplement (NEPRO CARB STEADY)  237 mL Oral BID BM   ipratropium-albuterol  3 mL Nebulization Q6H   methylPREDNISolone (SOLU-MEDROL) injection  60 mg Intravenous Q12H   midodrine  10 mg Oral TID WC   sevelamer carbonate  800 mg Oral TID WC   Continuous Infusions:  anticoagulant sodium citrate      Procedures/Studies: DG CHEST PORT 1 VIEW  Result Date: 10/23/2021 CLINICAL DATA:  Respiratory distress. EXAM: PORTABLE CHEST 1 VIEW COMPARISON:  Chest x-rays dated 10/20/2021, 09/06/2021 and 09/05/2021. FINDINGS: Stable cardiomegaly. Lungs are stable. No confluence opacity to suggest a developing pneumonia. No evidence of overt alveolar pulmonary edema. Coarse lung markings bilaterally suggests chronic interstitial lung disease/fibrosis. There is also chronic elevation of the RIGHT hemidiaphragm. No acute-appearing osseous abnormality. IMPRESSION: 1. No active disease. No evidence of pneumonia or alveolar pulmonary edema. 2. Stable cardiomegaly. 3. Probable chronic interstitial lung disease/fibrosis. 4. Stable pleural thickening/fluid at the RIGHT lung fissure. Electronically Signed   By: Franki Cabot M.D.   On: 10/23/2021 11:13   DG Chest Portable 1 View  Result Date: 10/20/2021 CLINICAL DATA:  Chest pain. Shortness of breath. Symptoms began during dialysis. EXAM: PORTABLE CHEST 1 VIEW COMPARISON:  One-view chest x-ray 09/06/2021 FINDINGS: The heart is large. Atherosclerotic calcifications are present at the aortic arch. Mild diffuse interstitial pattern is present. No significant scratched at no focal airspace consolidation is present. Chronic  elevation of the right hemidiaphragm is noted. Lung volumes are low. Marked degenerative changes are present in both shoulders. IMPRESSION: 1. Cardiomegaly with mild interstitial edema. 2. No focal airspace disease. Electronically Signed   By: San Morelle M.D.   On: 10/20/2021 14:42    Orson Eva, DO  [facility]  If 7PM-7AM, please contact night-coverage [email or web address] Password [email or web address] 10/23/2021, 11:55 AM   LOS: 1 day

## 2021-10-23 NOTE — Assessment & Plan Note (Addendum)
Continue DuoNebs and IV steroids>>d/c with prednisone x 3 more days Added Pulmicort and Brovana during the hospitalization

## 2021-10-23 NOTE — Assessment & Plan Note (Signed)
Discussed with the patient's POA, [name] -- I discussed the patient's overall poor prognosis given her frailty, multiple comorbid conditions, numerous hospitalizations and poor functional baseline -- I discussed that with the privilege of seeing the patient on numerous occasions, that the patient has continued to experience a functional decline with subsequent hospitalizations --Advance care planning, including the explanation and discussion of advance directives was carried out with the patient and [name].  Code status including explanations of "Full Code" and "DNR" and alternatives were discussed in detail.  Discussion of end-of-life issues including but not limited palliative care, hospice care and the concept of hospice, other end-of-life care options, power of attorney for health care decisions, living wills, and physician orders for life-sustaining treatment were also discussed. Total face to face time 36 minutes. --HPOA expressed understanding and agrees to transition patient to DNR status

## 2021-10-23 NOTE — Progress Notes (Signed)
*  PRELIMINARY RESULTS* Echocardiogram Limited 2-D Echocardiogram  has been performed.  Jody [name] 10/23/2021, 12:58 PM

## 2021-10-23 NOTE — Progress Notes (Signed)
Patient c/o SOB, PRN nebulizer given O2 Sat 98% on 4lpm via N/C, no relief from nebulizer treatment. VS taken, physician called, patient evaluated at bedside by physician and gave order to transfer to ICU, Patient family member notified of transfer. Verbal report given to receiving nurse and patient transferred to ICU bed 9 with all belongings by nurse tech.

## 2021-10-24 ENCOUNTER — Encounter (HOSPITAL_COMMUNITY): Payer: Self-pay | Admitting: Internal Medicine

## 2021-10-24 DIAGNOSIS — E669 Obesity, unspecified: Secondary | ICD-10-CM | POA: Diagnosis not present

## 2021-10-24 DIAGNOSIS — I5043 Acute on chronic combined systolic (congestive) and diastolic (congestive) heart failure: Secondary | ICD-10-CM | POA: Diagnosis not present

## 2021-10-24 DIAGNOSIS — J441 Chronic obstructive pulmonary disease with (acute) exacerbation: Secondary | ICD-10-CM | POA: Diagnosis not present

## 2021-10-24 DIAGNOSIS — Z7189 Other specified counseling: Secondary | ICD-10-CM

## 2021-10-24 DIAGNOSIS — J9621 Acute and chronic respiratory failure with hypoxia: Secondary | ICD-10-CM | POA: Diagnosis not present

## 2021-10-24 LAB — RENAL FUNCTION PANEL
Albumin: 4.2 g/dL (ref 3.5–5.0)
Anion gap: 13 (ref 5–15)
BUN: 54 mg/dL — ABNORMAL HIGH (ref 8–23)
CO2: 27 mmol/L (ref 22–32)
Calcium: 10.4 mg/dL — ABNORMAL HIGH (ref 8.9–10.3)
Chloride: 94 mmol/L — ABNORMAL LOW (ref 98–111)
Creatinine, Ser: 3.33 mg/dL — ABNORMAL HIGH (ref 0.44–1.00)
GFR, Estimated: 13 mL/min — ABNORMAL LOW (ref >60)
Glucose, Bld: 193 mg/dL — ABNORMAL HIGH (ref 70–99)
Phosphorus: 6 mg/dL — ABNORMAL HIGH (ref 2.5–4.6)
Potassium: 4.1 mmol/L (ref 3.5–5.1)
Sodium: 134 mmol/L — ABNORMAL LOW (ref 135–145)

## 2021-10-24 MED ORDER — IPRATROPIUM-ALBUTEROL 0.5-2.5 (3) MG/3ML IN SOLN: 3.0000 mL | RESPIRATORY_TRACT | Status: AC | PRN

## 2021-10-24 MED ORDER — CALCITRIOL 0.5 MCG PO CAPS: 0.5000 ug | ORAL_CAPSULE | ORAL | Status: AC

## 2021-10-24 MED ORDER — PREDNISONE 50 MG PO TABS: 50.0000 mg | ORAL_TABLET | Freq: Every day | ORAL | Status: DC

## 2021-10-24 MED ORDER — METOPROLOL SUCCINATE ER 25 MG PO TB24: 25.0000 mg | ORAL_TABLET | Freq: Every day | ORAL | Status: DC

## 2021-10-24 MED ORDER — LOSARTAN POTASSIUM 25 MG PO TABS: 12.5000 mg | ORAL_TABLET | Freq: Every day | ORAL | Status: DC

## 2021-10-24 MED ORDER — CINACALCET HCL 30 MG PO TABS: 30.0000 mg | ORAL_TABLET | ORAL | Status: AC

## 2021-10-24 MED ORDER — DARBEPOETIN ALFA 60 MCG/0.3ML IJ SOSY
60.0000 ug | PREFILLED_SYRINGE | INTRAMUSCULAR | Status: DC
Start: 2021-10-25 — End: 2021-10-24

## 2021-10-24 MED ORDER — PREDNISONE 20 MG PO TABS: 50.0000 mg | ORAL_TABLET | Freq: Every day | ORAL | Status: DC

## 2021-10-24 MED ORDER — MIDODRINE HCL 10 MG PO TABS: 10.0000 mg | ORAL_TABLET | Freq: Three times a day (TID) | ORAL | Status: DC

## 2021-10-24 NOTE — Progress Notes (Signed)
Subjective:  no acute events overnight-  resting in ICU-  4 liters [place] Objective Vital signs in last 24 hours: Vitals:   10/24/21 0433 10/24/21 0600 10/24/21 0726 10/24/21 0825  BP: (!) 141/74     Pulse: 67     Resp: 19     Temp:    98.8 F (37.1 C)  TempSrc:    Oral  SpO2: 100%  99%   Weight:  79.5 kg    Height:       Weight change: -4.6 kg No intake or output data in the 24 hours ending 10/24/21 0842  -Outpatient orders: [facility], TTS, 3 hours 15 minutes.  EDW 81 kg.  2K/2.5 calcium.  400/500.  Meds: Mircera 200 mcg every 2 weeks (last dose 9/5), Venofer 50 mg q. weekly (last dose 9/5), calcitriol 0.5 mcg 3 times a week, Sensipar 30 mg 3 times a week.  No heparin (history of GI bleed)   Assessment/ Plan: Pt is a 81 y.o. yo female who was admitted on 10/20/2021 with SOB  Assessment/Plan: 1. SOB-  noted to have COPD and biventricular failure with episodes of Afib-  is on 3 liters O2 chronically as OP.  Does improve transiently with HD/UF but that is not the only issue-  on 4 liters oxygen  today -  planning for HD tomorrow with UF as able.  Maximize pulm issue per primary team  2. ESRD- normally TTS as OP-  have kept on schedule here-  plan for next HD tomorrow via AVF- UF as able  3. Anemia-  hgb adequate around 10-  cont ESA  4. Secondary hyperparathyroidism-  cont calcitriol, sensipar and renvela 5. HTN/volume-  no BP meds-  on midodrine-  UF as able  Louis Meckel    Labs: Basic Metabolic Panel: Recent Labs  Lab 10/20/21 2040 10/21/21 0446 10/22/21 1228 10/24/21 0417  NA 135 136 133* 134*  K 3.4* 3.6 4.5 4.1  CL 96* 97* 97* 94*  CO2 '28 27 28 27  '$ GLUCOSE 115* 108* 188* 193*  BUN 22 26* 43* 54*  CREATININE 2.23* 2.61* 3.57* 3.33*  CALCIUM 9.2 9.6 10.4* 10.4*  PHOS 3.1  --  5.9* 6.0*   Liver Function Tests: Recent Labs  Lab 10/20/21 1441 10/20/21 2040 10/22/21 1228 10/24/21 0417  AST 16  --   --   --   ALT 15  --   --   --   ALKPHOS 181*  --   --    --   BILITOT 1.2  --   --   --   PROT 6.9  --   --   --   ALBUMIN 3.7 4.0 3.9 4.2   No results for input(s): "LIPASE", "AMYLASE" in the last 168 hours. No results for input(s): "AMMONIA" in the last 168 hours. CBC: Recent Labs  Lab 10/20/21 1441 10/20/21 2040 10/22/21 1228  WBC 5.4 4.3 5.4  NEUTROABS 4.2  --   --   HGB 9.9* 10.1* 10.0*  HCT 34.3* 34.7* 33.7*  MCV 110.6* 109.8* 109.1*  PLT 160 139* 132*   Cardiac Enzymes: No results for input(s): "CKTOTAL", "CKMB", "CKMBINDEX", "TROPONINI" in the last 168 hours. CBG: Recent Labs  Lab 10/22/21 1101 10/22/21 1701 10/22/21 2118 10/23/21 0501 10/23/21 1819  GLUCAP 188* 159* 152* 165* 224*    Iron Studies: No results for input(s): "IRON", "TIBC", "TRANSFERRIN", "FERRITIN" in the last 72 hours. Studies/Results: ECHOCARDIOGRAM LIMITED  Result Date: 10/23/2021    ECHOCARDIOGRAM LIMITED  REPORT   Patient Name:   Jody [name] Date of Exam: 10/23/2021 Medical Rec #:  [id number]       Height:       62.0 in Accession #:    [id number]      Weight:       174.8 lb Date of Birth:  1940-09-06       BSA:          1.805 m Patient Age:    81 years        BP:           124/73 mmHg Patient Gender: F               HR:           99 bpm. Exam Location:  [facility] Procedure: Limited Echo, Limited Color Doppler and Cardiac Doppler Indications:    CHF-Acute Diastolic W43.15  History:        Patient has prior history of Echocardiogram examinations, most                 recent 06/03/2021. CHF, Previous Myocardial Infarction, Stroke                 and COPD, Arrythmias:Atrial Fibrillation; Risk                 Factors:Hypertension, Diabetes and Dyslipidemia. Hx of ESRD on                 dialysis,.  Sonographer:    Alvino Chapel RCS Referring Phys: [phone] DAVID TAT IMPRESSIONS  1. Left ventricular ejection fraction, by estimation, is 40 to 45%. The left ventricle has mildly decreased function. The left ventricle demonstrates global hypokinesis. There is moderate  concentric left ventricular hypertrophy. Left ventricular diastolic function could not be evaluated. There is the interventricular septum is flattened in systole and diastole, consistent with right ventricular pressure and volume overload.  2. Right ventricular systolic function is moderately reduced. The right ventricular size is normal. A Prominent moderator band is visualized. There is moderately elevated pulmonary artery systolic pressure. The estimated right ventricular systolic pressure is 67.6 mmHg.  3. Left atrial size was severely dilated.  4. Right atrial size was severely dilated.  5. The mitral valve is degenerative. Moderate mitral annular calcification.  6. Tricuspid valve regurgitation is moderate.  7. The aortic valve is tricuspid. There is moderate calcification of the aortic valve. There is moderate thickening of the aortic valve. No aortic stenosis is present.  8. The inferior vena cava is dilated in size with <50% respiratory variability, suggesting right atrial pressure of 15 mmHg. FINDINGS  Left Ventricle: Left ventricular ejection fraction, by estimation, is 40 to 45%. The left ventricle has mildly decreased function. The left ventricle demonstrates global hypokinesis. The left ventricular internal cavity size was normal in size. There is  moderate concentric left ventricular hypertrophy. Abnormal (paradoxical) septal motion, consistent with left bundle branch block and the interventricular septum is flattened in systole and diastole, consistent with right ventricular pressure and volume overload. Left ventricular diastolic function could not be evaluated. Left ventricular diastolic function could not be evaluated due to atrial fibrillation. Right Ventricle: The right ventricular size is normal. No increase in right ventricular wall thickness. Right ventricular systolic function is moderately reduced. There is moderately elevated pulmonary artery systolic pressure. The tricuspid regurgitant  velocity is 3.26 m/s, and with an assumed right atrial pressure of 15 mmHg, the estimated right ventricular systolic pressure is 57.5  mmHg. Left Atrium: Left atrial size was severely dilated. Right Atrium: Right atrial size was severely dilated. Pericardium: There is no evidence of pericardial effusion. Mitral Valve: The mitral valve is degenerative in appearance. There is moderate thickening of the mitral valve leaflet(s). There is moderate calcification of the mitral valve leaflet(s). Moderate mitral annular calcification. Tricuspid Valve: The tricuspid valve is normal in structure. Tricuspid valve regurgitation is moderate . No evidence of tricuspid stenosis. Aortic Valve: The aortic valve is tricuspid. There is moderate calcification of the aortic valve. There is moderate thickening of the aortic valve. No aortic stenosis is present. Pulmonic Valve: The pulmonic valve was normal in structure. Pulmonic valve regurgitation is not visualized. No evidence of pulmonic stenosis. Aorta: The aortic root is normal in size and structure. Venous: The inferior vena cava is dilated in size with less than 50% respiratory variability, suggesting right atrial pressure of 15 mmHg. IAS/Shunts: No atrial level shunt detected by color flow Doppler. Additional Comments: A Prominent moderator band is visualized. LEFT VENTRICLE PLAX 2D LVIDd:         4.10 cm LVIDs:         3.50 cm LV PW:         1.50 cm LV IVS:        1.40 cm LVOT diam:     1.80 cm LVOT Area:     2.54 cm  LV Volumes (MOD) LV vol d, MOD A2C: 78.4 ml LV vol d, MOD A4C: 47.9 ml LV vol s, MOD A2C: 40.6 ml LV vol s, MOD A4C: 27.3 ml LV SV MOD A2C:     37.8 ml LV SV MOD A4C:     47.9 ml LV SV MOD BP:      30.1 ml RIGHT VENTRICLE TAPSE (M-mode): 1.4 cm LEFT ATRIUM         Index LA diam:    4.50 cm 2.49 cm/m   AORTA Ao Root diam: 3.60 cm TRICUSPID VALVE TR Peak grad:   42.5 mmHg TR Vmax:        326.00 cm/s  SHUNTS Systemic Diam: 1.80 cm Fransico Him MD Electronically signed  by Fransico Him MD Signature Date/Time: 10/23/2021/2:12:30 PM    Final    DG CHEST PORT 1 VIEW  Result Date: 10/23/2021 CLINICAL DATA:  Respiratory distress. EXAM: PORTABLE CHEST 1 VIEW COMPARISON:  Chest x-rays dated 10/20/2021, 09/06/2021 and 09/05/2021. FINDINGS: Stable cardiomegaly. Lungs are stable. No confluence opacity to suggest a developing pneumonia. No evidence of overt alveolar pulmonary edema. Coarse lung markings bilaterally suggests chronic interstitial lung disease/fibrosis. There is also chronic elevation of the RIGHT hemidiaphragm. No acute-appearing osseous abnormality. IMPRESSION: 1. No active disease. No evidence of pneumonia or alveolar pulmonary edema. 2. Stable cardiomegaly. 3. Probable chronic interstitial lung disease/fibrosis. 4. Stable pleural thickening/fluid at the RIGHT lung fissure. Electronically Signed   By: Franki Cabot M.D.   On: 10/23/2021 11:13   Medications: Infusions:  anticoagulant sodium citrate      Scheduled Medications:  apixaban  2.5 mg Oral BID   arformoterol  15 mcg Nebulization BID   atorvastatin  40 mg Oral QHS   budesonide (PULMICORT) nebulizer solution  0.5 mg Nebulization BID   calcitRIOL  0.5 mcg Oral Q T,Th,Sa-HD   Chlorhexidine Gluconate Cloth  6 each Topical Q0600   cinacalcet  30 mg Oral Q T,Th,Sa-HD   feeding supplement (NEPRO CARB STEADY)  237 mL Oral BID BM   ipratropium-albuterol  3 mL Nebulization Q6H   methylPREDNISolone (  SOLU-MEDROL) injection  60 mg Intravenous Q12H   metoprolol tartrate  2.5 mg Intravenous Q6H   midodrine  10 mg Oral TID WC   sevelamer carbonate  800 mg Oral TID WC    have reviewed scheduled and prn medications.  Physical Exam: General:  NAD-  resting  Heart: HR mostly above 90-  irreg Lungs: poor air movement Abdomen: soft, non tender Extremities: pitting edema to dep areas Dialysis Access: right upper AVF-   good thrill and bruit    10/24/2021,8:42 AM  LOS: 2 days

## 2021-10-24 NOTE — TOC Transition Note (Signed)
Transition of Care [facility]) - CM/SW Discharge Note   Patient Details  [name] MRN: [id number] Date of Birth: 10/08/1940  [facility] CM/SW Contact:  Ihor Gully, LCSW Phone Number: 10/24/2021, 11:07 AM   Clinical Narrative:    Discharge clinicals sent to facility. RN to call report. EMS to transport. Niece notified. TOC signing off.    Final next level of care: Skilled Nursing Facility Barriers to Discharge: No Barriers Identified   Patient Goals and CMS Choice Patient states their goals for this hospitalization and ongoing recovery are:: return to facility      Discharge Placement              Patient chooses bed at: [facility] Patient to be transferred to facility by: [facility] Name of family member notified: niece, [name] Patient and family notified of of transfer: 10/24/21  Discharge Plan and Services                                     Social Determinants of Health (SDOH) Interventions     Readmission Risk Interventions    08/15/2021   11:49 AM 06/07/2021    2:47 PM 06/01/2021   10:59 AM  Readmission Risk Prevention Plan  Transportation Screening Complete Complete Complete  Medication Review [employer])  Complete Complete  PCP or Specialist appointment within 3-5 days of discharge  Complete   HRI or Home Care Consult Complete Complete Complete  SW Recovery Care/Counseling Consult Complete Complete Complete  Palliative Care Screening Not Applicable Complete Not Applicable  Skilled Nursing Facility Complete Complete Complete

## 2021-10-24 NOTE — Discharge Summary (Signed)
Physician Discharge Summary   Patient: Jody [name] MRN: [id number] DOB: Mar 05, 1940  Admit date:     10/20/2021  Discharge date: 10/24/21  Discharge Physician: Shanon Brow Mairead Schwarzkopf   PCP: Monico Blitz, MD   Recommendations at discharge:   Please follow up with primary care provider within 1-2 weeks  Please repeat BMP and CBC in one week     Hospital Course: 81 year old female with history of stroke, COPD, chronic respiratory failure on 3 L, systolic and diastolic CHF, persistent atrial fibrillation, ESRD, iron deficiency presenting from her nursing facility [facility]) from which she is a permanent resident secondary to shortness of breath.  The patient had dialysis on 10/20/2021.  The dialysis was cut short by about 20 to 30 minutes secondary to her leg pain.  The patient had worsening shortness of breath after dialysis.  She reported a cough with white sputum.  Apparently she was hypoxic with saturation down into the 70s on her usual 3 L.  She was placed on 5 L with improving saturation up to 93% and brought to the emergency department for further evaluation.  The patient had a similar admission from 09/06/2021 to 09/08/2021 where she was fluid overloaded.  The patient states that she has been compliant with her fluid restriction.  However her family does bring her carryout food regularly each week. The patient denies any chest pain, fever, chills, nausea, vomiting or diarrhea, abdominal pain. In the ED, the patient was afebrile hemodynamically stable.  Oxygen saturation was 93% on 5 L.  WBC 5.4, hemoglobin 9.9, platelets 160,000.  Chest x-ray showed pulmonary edema.  EKG showed atrial fibrillation with nonspecific T wave changes.  Troponin 50>> 24.  Nephrology was consulted, and the patient underwent ultrafiltration on the evening of 10/20/2021 removing 1.7 L with improvement of her respiratory status.  Nephrology was consulted to assist with management. The patient underwent dialysis with fluid removal with  overall improvement of her dyspnea.  However throughout the hospitalization, the patient would continue to have episodes of shortness of breath on her nondialysis days.  This was felt in part due to the patient's COPD, anxiety, and continued hypervolemic state. The patient was also noted to have atrial fibrillation with RVR.  She was moved back to the stepdown unit. She was started on lopressor IV with improvement.  Her steroids were continued.   Assessment and Plan: * Acute on chronic respiratory failure with hypoxia and hypercapnia (HCC) Personally reviewed CXR--increased interstitial markings Initially up to 6 L nasal cannula Chronic on 3 L --Patient continues to have intermittent episodes of dyspnea and tachypnea>> suspect this is being driven by underlying A-fib with RVR, COPD exac, anxiety, and continued hypervolemic state with biventricular failure -- 9/10Personally reviewed cxr--chronic interstitial prominence without consolidation -- now back on 3L -- 10/24/21 Echo-EF 40-45%, global HK, RV overload, PASP 57.5  ESRD on dialysis [facility]) Schedule--Tuesday Thursday Saturday.   Reports compliance with her HD sessions.  The session was terminated 20 minutes before this to be completed due to complaints of pain. -Resume Renal meds. -Appreciate nephrology -last HD on 10/22/21 -next HD on 10/25/21  Acute on chronic combined systolic and diastolic CHF (congestive heart failure) ([place]) Was above her usual dry weight - Manage volume with HD -06/03/21 Echo EF 50%, RV overload, severe TR -08/02/20 Echo EF 45-50% HK inferoseptal, inferolateral, Inf wall; mod TR -personally reviewed CXR 4/21--increased interstitial markings Ultrafiltration 1.7 L on 10/20/2021 dialysis 10/22/2021>>3.2L -- 9/10 limited echo-- 10/24/21 Echo-EF 40-45%, global HK, RV overload,  PASP 57.5 --start metoprolol succinate  COPD with acute exacerbation (HCC) Continue DuoNebs and IV steroids>>d/c with prednisone x 3 more days Added  Pulmicort and Brovana during the hospitalization  Persistent atrial fibrillation (HCC) On anticoagulation with Eliquis. Rate Controlled, heart rate 50s to 90s.  Not on rate limiting agents. - Resume Eliquis 10/23/21--pt had RVR>>transfer to SDU>>started on IV lopressor -transitioned to po metoprolol succinate -now rate controlled  Essential hypertension Previously on metoprolol which has been discontinued BPs remain stable  Type 2 diabetes mellitus with nephropathy (HCC) A1c 5.7.  Not on medication.   Thrombocytopenia ([place]) Fairly chronic dating back to Feb 2023 -B12--1435 -folate-7.0 -TSH--1.132  Goals of care, counseling/discussion Discussed with the patient's POA, [name] -- I discussed the patient's overall poor prognosis given her frailty, multiple comorbid conditions, numerous hospitalizations and poor functional baseline -- I discussed that with the privilege of seeing the patient on numerous occasions, that the patient has continued to experience a functional decline with subsequent hospitalizations --Advance care planning, including the explanation and discussion of advance directives was carried out with the patient and [name].  Code status including explanations of "Full Code" and "DNR" and alternatives were discussed in detail.  Discussion of end-of-life issues including but not limited palliative care, hospice care and the concept of hospice, other end-of-life care options, power of attorney for health care decisions, living wills, and physician orders for life-sustaining treatment were also discussed. Total face to face time 36 minutes. --HPOA expressed understanding and agrees to transition patient to DNR status   Class 1 obesity BMI 32.62 Lifestyle modification  Hypokalemia Potassium 3.1.  Normal magnesium 2.4. -Supplemented in the ED, and per HD         Consultants: renal Procedures performed: none  Disposition: Skilled nursing facility Diet  recommendation:  Renal diet DISCHARGE MEDICATION: Allergies as of 10/24/2021       Reactions   Codeine Nausea And Vomiting        Medication List     STOP taking these medications    atorvastatin 40 MG tablet Commonly known as: LIPITOR   feeding supplement (NEPRO CARB STEADY) Liqd   guaiFENesin 600 MG 12 hr tablet Commonly known as: MUCINEX       TAKE these medications    acetaminophen 325 MG tablet Commonly known as: TYLENOL Take 325 mg by mouth 3 (three) times daily.   albuterol (2.5 MG/3ML) 0.083% nebulizer solution Commonly known as: PROVENTIL Take 2.5 mg by nebulization every 6 (six) hours as needed for wheezing or shortness of breath. What changed: Another medication with the same name was changed. Make sure you understand how and when to take each.   albuterol 108 (90 Base) MCG/ACT inhaler Commonly known as: VENTOLIN HFA Inhale 2 puffs into the lungs every 6 (six) hours as needed for wheezing or shortness of breath. Reported on 03/01/2015 What changed: how much to take   apixaban 2.5 MG Tabs tablet Commonly known as: ELIQUIS Take 2.5 mg by mouth 2 (two) times daily.   calcitRIOL 0.5 MCG capsule Commonly known as: ROCALTROL Take 1 capsule (0.5 mcg total) by mouth Every Tuesday,Thursday,and Saturday with dialysis. Start taking on: October 25, 2021   cinacalcet 30 MG tablet Commonly known as: SENSIPAR Take 1 tablet (30 mg total) by mouth Every Tuesday,Thursday,and Saturday with dialysis. Start taking on: October 25, 2021   dextromethorphan-guaiFENesin 30-600 MG 12hr tablet Commonly known as: MUCINEX DM Take 1 tablet by mouth 2 (two) times daily.   ferrous sulfate  325 (65 FE) MG tablet Take 325 mg by mouth 2 (two) times daily with a meal.   Flulaval Quadrivalent 0.5 ML injection Generic drug: influenza vac split quadrivalent PF Inject 0.5 mLs into the muscle once.   folic acid 623 MCG tablet Commonly known as: FOLVITE Take 800 mcg by mouth  daily.   gabapentin 300 MG capsule Commonly known as: NEURONTIN Take 300 mg by mouth daily.   ipratropium-albuterol 0.5-2.5 (3) MG/3ML Soln Commonly known as: DUONEB Take 3 mLs by nebulization every 4 (four) hours as needed.   metoprolol succinate 25 MG 24 hr tablet Commonly known as: TOPROL-XL Take 1 tablet (25 mg total) by mouth daily.   midodrine 10 MG tablet Commonly known as: PROAMATINE Take 1 tablet (10 mg total) by mouth 3 (three) times daily with meals. What changed: when to take this   OXYGEN Inhale into the lungs. May use 2-4 liters   pravastatin 40 MG tablet Commonly known as: PRAVACHOL Take 40 mg by mouth daily.   predniSONE 50 MG tablet Commonly known as: DELTASONE Take 1 tablet (50 mg total) by mouth daily with breakfast. X 3 days Start taking on: October 25, 2021   PreserVision AREDS Tabs Take 1 tablet by mouth at bedtime.   sevelamer carbonate 800 MG tablet Commonly known as: RENVELA Take 800 mg by mouth 3 (three) times daily with meals.        Contact information for after-discharge care     Destination     [facility] .   Service: Skilled Nursing Contact information: [address] [place] [phone]                    Discharge Exam: Danley Danker Weights   10/23/21 0521 10/23/21 1123 10/24/21 0600  Weight: 79.3 kg 78.2 kg 79.5 kg   HEENT:  /AT, No thrush, no icterus CV:  IRRR, no rub, no S3, no S4 Lung:  bibasilar crackles. No wheeze Abd:  soft/+BS, NT Ext:  No edema, no lymphangitis, no synovitis, no rash   Condition at discharge: stable  The results of significant diagnostics from this hospitalization (including imaging, microbiology, ancillary and laboratory) are listed below for reference.   Imaging Studies: ECHOCARDIOGRAM LIMITED  Result Date: 10/23/2021    ECHOCARDIOGRAM LIMITED REPORT   Patient Name:   JUDITH DEMPS Date of  Exam: 10/23/2021 Medical Rec #:  [id number]       Height:       62.0 in Accession #:    [id number]      Weight:       174.8 lb Date of Birth:  08-10-1940       BSA:          1.805 m Patient Age:    57 years        BP:           124/73 mmHg Patient Gender: F               HR:           99 bpm. Exam Location:  [facility] Procedure: Limited Echo, Limited Color Doppler and Cardiac Doppler Indications:    CHF-Acute Diastolic O35.00  History:        Patient has prior history of Echocardiogram examinations, most                 recent 06/03/2021. CHF, Previous Myocardial Infarction, Stroke  and COPD, Arrythmias:Atrial Fibrillation; Risk                 Factors:Hypertension, Diabetes and Dyslipidemia. Hx of ESRD on                 dialysis,.  Sonographer:    Alvino Chapel RCS Referring Phys: [phone] Siniyah Evangelist IMPRESSIONS  1. Left ventricular ejection fraction, by estimation, is 40 to 45%. The left ventricle has mildly decreased function. The left ventricle demonstrates global hypokinesis. There is moderate concentric left ventricular hypertrophy. Left ventricular diastolic function could not be evaluated. There is the interventricular septum is flattened in systole and diastole, consistent with right ventricular pressure and volume overload.  2. Right ventricular systolic function is moderately reduced. The right ventricular size is normal. A Prominent moderator band is visualized. There is moderately elevated pulmonary artery systolic pressure. The estimated right ventricular systolic pressure is 76.1 mmHg.  3. Left atrial size was severely dilated.  4. Right atrial size was severely dilated.  5. The mitral valve is degenerative. Moderate mitral annular calcification.  6. Tricuspid valve regurgitation is moderate.  7. The aortic valve is tricuspid. There is moderate calcification of the aortic valve. There is moderate thickening of the aortic valve. No aortic stenosis is present.  8. The inferior vena cava is  dilated in size with <50% respiratory variability, suggesting right atrial pressure of 15 mmHg. FINDINGS  Left Ventricle: Left ventricular ejection fraction, by estimation, is 40 to 45%. The left ventricle has mildly decreased function. The left ventricle demonstrates global hypokinesis. The left ventricular internal cavity size was normal in size. There is  moderate concentric left ventricular hypertrophy. Abnormal (paradoxical) septal motion, consistent with left bundle branch block and the interventricular septum is flattened in systole and diastole, consistent with right ventricular pressure and volume overload. Left ventricular diastolic function could not be evaluated. Left ventricular diastolic function could not be evaluated due to atrial fibrillation. Right Ventricle: The right ventricular size is normal. No increase in right ventricular wall thickness. Right ventricular systolic function is moderately reduced. There is moderately elevated pulmonary artery systolic pressure. The tricuspid regurgitant velocity is 3.26 m/s, and with an assumed right atrial pressure of 15 mmHg, the estimated right ventricular systolic pressure is 95.0 mmHg. Left Atrium: Left atrial size was severely dilated. Right Atrium: Right atrial size was severely dilated. Pericardium: There is no evidence of pericardial effusion. Mitral Valve: The mitral valve is degenerative in appearance. There is moderate thickening of the mitral valve leaflet(s). There is moderate calcification of the mitral valve leaflet(s). Moderate mitral annular calcification. Tricuspid Valve: The tricuspid valve is normal in structure. Tricuspid valve regurgitation is moderate . No evidence of tricuspid stenosis. Aortic Valve: The aortic valve is tricuspid. There is moderate calcification of the aortic valve. There is moderate thickening of the aortic valve. No aortic stenosis is present. Pulmonic Valve: The pulmonic valve was normal in structure. Pulmonic valve  regurgitation is not visualized. No evidence of pulmonic stenosis. Aorta: The aortic root is normal in size and structure. Venous: The inferior vena cava is dilated in size with less than 50% respiratory variability, suggesting right atrial pressure of 15 mmHg. IAS/Shunts: No atrial level shunt detected by color flow Doppler. Additional Comments: A Prominent moderator band is visualized. LEFT VENTRICLE PLAX 2D LVIDd:         4.10 cm LVIDs:         3.50 cm LV PW:  1.50 cm LV IVS:        1.40 cm LVOT diam:     1.80 cm LVOT Area:     2.54 cm  LV Volumes (MOD) LV vol d, MOD A2C: 78.4 ml LV vol d, MOD A4C: 47.9 ml LV vol s, MOD A2C: 40.6 ml LV vol s, MOD A4C: 27.3 ml LV SV MOD A2C:     37.8 ml LV SV MOD A4C:     47.9 ml LV SV MOD BP:      30.1 ml RIGHT VENTRICLE TAPSE (M-mode): 1.4 cm LEFT ATRIUM         Index LA diam:    4.50 cm 2.49 cm/m   AORTA Ao Root diam: 3.60 cm TRICUSPID VALVE TR Peak grad:   42.5 mmHg TR Vmax:        326.00 cm/s  SHUNTS Systemic Diam: 1.80 cm Fransico Him MD Electronically signed by Fransico Him MD Signature Date/Time: 10/23/2021/2:12:30 PM    Final    DG CHEST PORT 1 VIEW  Result Date: 10/23/2021 CLINICAL DATA:  Respiratory distress. EXAM: PORTABLE CHEST 1 VIEW COMPARISON:  Chest x-rays dated 10/20/2021, 09/06/2021 and 09/05/2021. FINDINGS: Stable cardiomegaly. Lungs are stable. No confluence opacity to suggest a developing pneumonia. No evidence of overt alveolar pulmonary edema. Coarse lung markings bilaterally suggests chronic interstitial lung disease/fibrosis. There is also chronic elevation of the RIGHT hemidiaphragm. No acute-appearing osseous abnormality. IMPRESSION: 1. No active disease. No evidence of pneumonia or alveolar pulmonary edema. 2. Stable cardiomegaly. 3. Probable chronic interstitial lung disease/fibrosis. 4. Stable pleural thickening/fluid at the RIGHT lung fissure. Electronically Signed   By: Franki Cabot M.D.   On: 10/23/2021 11:13   DG Chest Portable 1  View  Result Date: 10/20/2021 CLINICAL DATA:  Chest pain. Shortness of breath. Symptoms began during dialysis. EXAM: PORTABLE CHEST 1 VIEW COMPARISON:  One-view chest x-ray 09/06/2021 FINDINGS: The heart is large. Atherosclerotic calcifications are present at the aortic arch. Mild diffuse interstitial pattern is present. No significant scratched at no focal airspace consolidation is present. Chronic elevation of the right hemidiaphragm is noted. Lung volumes are low. Marked degenerative changes are present in both shoulders. IMPRESSION: 1. Cardiomegaly with mild interstitial edema. 2. No focal airspace disease. Electronically Signed   By: San Morelle M.D.   On: 10/20/2021 14:42    Microbiology: Results for orders placed or performed during the hospital encounter of 10/20/21  Resp Panel by RT-PCR (Flu A&B, Covid) Anterior Nasal Swab     Status: None   Collection Time: 10/20/21  2:19 PM   Specimen: Anterior Nasal Swab  Result Value Ref Range Status   SARS Coronavirus 2 by RT PCR NEGATIVE NEGATIVE Final    Comment: (NOTE) SARS-CoV-2 target nucleic acids are NOT DETECTED.  The SARS-CoV-2 RNA is generally detectable in upper respiratory specimens during the acute phase of infection. The lowest concentration of SARS-CoV-2 viral copies this assay can detect is 138 copies/mL. A negative result does not preclude SARS-Cov-2 infection and should not be used as the sole basis for treatment or other patient management decisions. A negative result may occur with  improper specimen collection/handling, submission of specimen other than nasopharyngeal swab, presence of viral mutation(s) within the areas targeted by this assay, and inadequate number of viral copies(<138 copies/mL). A negative result must be combined with clinical observations, patient history, and epidemiological information. The expected result is Negative.  Fact Sheet for Patients:   EntrepreneurPulse.com.au  Fact Sheet for Healthcare Providers:  IncredibleEmployment.be  This test is no t yet approved or cleared by the [place] and  has been authorized for detection and/or diagnosis of SARS-CoV-2 by FDA under an Emergency Use Authorization (EUA). This EUA will remain  in effect (meaning this test can be used) for the duration of the COVID-19 declaration under Section 564(b)(1) of the Act, 21 U.S.C.section 360bbb-3(b)(1), unless the authorization is terminated  or revoked sooner.       Influenza A by PCR NEGATIVE NEGATIVE Final   Influenza B by PCR NEGATIVE NEGATIVE Final    Comment: (NOTE) The Xpert Xpress SARS-CoV-2/FLU/RSV plus assay is intended as an aid in the diagnosis of influenza from Nasopharyngeal swab specimens and should not be used as a sole basis for treatment. Nasal washings and aspirates are unacceptable for Xpert Xpress SARS-CoV-2/FLU/RSV testing.  Fact Sheet for Patients: EntrepreneurPulse.com.au  Fact Sheet for Healthcare Providers: IncredibleEmployment.be  This test is not yet approved or cleared by the [place] FDA and has been authorized for detection and/or diagnosis of SARS-CoV-2 by FDA under an Emergency Use Authorization (EUA). This EUA will remain in effect (meaning this test can be used) for the duration of the COVID-19 declaration under Section 564(b)(1) of the Act, 21 U.S.C. section 360bbb-3(b)(1), unless the authorization is terminated or revoked.  Performed at [facility], [address], [place], [place] [address]   MRSA Next Gen by PCR, Nasal     Status: None   Collection Time: 10/22/21  4:30 PM   Specimen: Nasal Mucosa; Nasal Swab  Result Value Ref Range Status   MRSA by PCR Next Gen NOT DETECTED NOT DETECTED Final    Comment: (NOTE) The GeneXpert MRSA Assay (FDA approved for NASAL specimens only), is one component of a  comprehensive MRSA colonization surveillance program. It is not intended to diagnose MRSA infection nor to guide or monitor treatment for MRSA infections. Test performance is not FDA approved in patients less than 35 years old. Performed at [facility], [address], [place], [place] [address]     Labs: CBC: Recent Labs  Lab 10/20/21 1441 10/20/21 2040 10/22/21 1228  WBC 5.4 4.3 5.4  NEUTROABS 4.2  --   --   HGB 9.9* 10.1* 10.0*  HCT 34.3* 34.7* 33.7*  MCV 110.6* 109.8* 109.1*  PLT 160 139* 335*   Basic Metabolic Panel: Recent Labs  Lab 10/20/21 1441 10/20/21 2040 10/21/21 0446 10/22/21 1228 10/24/21 0417  NA 133* 135 136 133* 134*  K 3.1* 3.4* 3.6 4.5 4.1  CL 96* 96* 97* 97* 94*  CO2 '27 28 27 28 27  '$ GLUCOSE 134* 115* 108* 188* 193*  BUN 28* 22 26* 43* 54*  CREATININE 2.66* 2.23* 2.61* 3.57* 3.33*  CALCIUM 9.2 9.2 9.6 10.4* 10.4*  MG 2.4  --   --   --   --   PHOS  --  3.1  --  5.9* 6.0*   Liver Function Tests: Recent Labs  Lab 10/20/21 1441 10/20/21 2040 10/22/21 1228 10/24/21 0417  AST 16  --   --   --   ALT 15  --   --   --   ALKPHOS 181*  --   --   --   BILITOT 1.2  --   --   --   PROT 6.9  --   --   --   ALBUMIN 3.7 4.0 3.9 4.2   CBG: Recent Labs  Lab 10/22/21 1101 10/22/21 1701 10/22/21 2118 10/23/21 0501 10/23/21 1819  GLUCAP  188* 159* 152* 165* 224*    Discharge time spent: greater than 30 minutes.  Signed: Orson Eva, MD [facility] 10/24/2021

## 2021-10-24 NOTE — Care Management Important Message (Signed)
Important Message  Patient Details  [name] MRN: [id number] Date of Birth: [birth year]   Medicare Important Message Given:  Yes (copy left in room with family)     [name] 10/24/2021, 11:15 AM

## 2021-10-25 DIAGNOSIS — N2581 Secondary hyperparathyroidism of renal origin: Secondary | ICD-10-CM | POA: Diagnosis not present

## 2021-10-25 DIAGNOSIS — N186 End stage renal disease: Secondary | ICD-10-CM | POA: Diagnosis not present

## 2021-10-25 DIAGNOSIS — Z992 Dependence on renal dialysis: Secondary | ICD-10-CM | POA: Diagnosis not present

## 2021-10-26 DIAGNOSIS — R490 Dysphonia: Secondary | ICD-10-CM | POA: Diagnosis not present

## 2021-10-26 DIAGNOSIS — Z87891 Personal history of nicotine dependence: Secondary | ICD-10-CM | POA: Diagnosis not present

## 2021-10-26 DIAGNOSIS — J383 Other diseases of vocal cords: Secondary | ICD-10-CM | POA: Diagnosis not present

## 2021-10-27 ENCOUNTER — Encounter (HOSPITAL_COMMUNITY): Payer: Self-pay

## 2021-10-27 ENCOUNTER — Other Ambulatory Visit: Payer: Self-pay

## 2021-10-27 ENCOUNTER — Inpatient Hospital Stay (HOSPITAL_COMMUNITY)
Admission: EM | Admit: 2021-10-27 | Discharge: 2021-11-03 | DRG: 189 | Disposition: A | Discharge status: Home / Self Care | Payer: Medicare Other | Source: Skilled Nursing Facility | Attending: Internal Medicine | Admitting: Internal Medicine

## 2021-10-27 ENCOUNTER — Emergency Department (HOSPITAL_COMMUNITY): Payer: Medicare Other

## 2021-10-27 DIAGNOSIS — Y95 Nosocomial condition: Secondary | ICD-10-CM | POA: Diagnosis present

## 2021-10-27 DIAGNOSIS — N2581 Secondary hyperparathyroidism of renal origin: Secondary | ICD-10-CM | POA: Diagnosis not present

## 2021-10-27 DIAGNOSIS — Z515 Encounter for palliative care: Secondary | ICD-10-CM

## 2021-10-27 DIAGNOSIS — J181 Lobar pneumonia, unspecified organism: Secondary | ICD-10-CM | POA: Diagnosis present

## 2021-10-27 DIAGNOSIS — Z66 Do not resuscitate: Secondary | ICD-10-CM | POA: Diagnosis present

## 2021-10-27 DIAGNOSIS — I509 Heart failure, unspecified: Secondary | ICD-10-CM

## 2021-10-27 DIAGNOSIS — E1151 Type 2 diabetes mellitus with diabetic peripheral angiopathy without gangrene: Secondary | ICD-10-CM | POA: Diagnosis present

## 2021-10-27 DIAGNOSIS — I959 Hypotension, unspecified: Secondary | ICD-10-CM | POA: Diagnosis not present

## 2021-10-27 DIAGNOSIS — R0602 Shortness of breath: Secondary | ICD-10-CM

## 2021-10-27 DIAGNOSIS — R778 Other specified abnormalities of plasma proteins: Secondary | ICD-10-CM

## 2021-10-27 DIAGNOSIS — E1122 Type 2 diabetes mellitus with diabetic chronic kidney disease: Secondary | ICD-10-CM | POA: Diagnosis not present

## 2021-10-27 DIAGNOSIS — E7800 Pure hypercholesterolemia, unspecified: Secondary | ICD-10-CM | POA: Diagnosis not present

## 2021-10-27 DIAGNOSIS — F419 Anxiety disorder, unspecified: Secondary | ICD-10-CM | POA: Diagnosis present

## 2021-10-27 DIAGNOSIS — I5023 Acute on chronic systolic (congestive) heart failure: Secondary | ICD-10-CM | POA: Diagnosis not present

## 2021-10-27 DIAGNOSIS — I499 Cardiac arrhythmia, unspecified: Secondary | ICD-10-CM | POA: Diagnosis not present

## 2021-10-27 DIAGNOSIS — J9621 Acute and chronic respiratory failure with hypoxia: Secondary | ICD-10-CM | POA: Diagnosis not present

## 2021-10-27 DIAGNOSIS — I5043 Acute on chronic combined systolic (congestive) and diastolic (congestive) heart failure: Secondary | ICD-10-CM | POA: Diagnosis present

## 2021-10-27 DIAGNOSIS — Z7901 Long term (current) use of anticoagulants: Secondary | ICD-10-CM

## 2021-10-27 DIAGNOSIS — Z8249 Family history of ischemic heart disease and other diseases of the circulatory system: Secondary | ICD-10-CM

## 2021-10-27 DIAGNOSIS — L89311 Pressure ulcer of right buttock, stage 1: Secondary | ICD-10-CM | POA: Diagnosis present

## 2021-10-27 DIAGNOSIS — Z7189 Other specified counseling: Secondary | ICD-10-CM | POA: Diagnosis not present

## 2021-10-27 DIAGNOSIS — I4819 Other persistent atrial fibrillation: Secondary | ICD-10-CM | POA: Diagnosis not present

## 2021-10-27 DIAGNOSIS — I132 Hypertensive heart and chronic kidney disease with heart failure and with stage 5 chronic kidney disease, or end stage renal disease: Secondary | ICD-10-CM | POA: Diagnosis present

## 2021-10-27 DIAGNOSIS — Z743 Need for continuous supervision: Secondary | ICD-10-CM | POA: Diagnosis not present

## 2021-10-27 DIAGNOSIS — J9601 Acute respiratory failure with hypoxia: Secondary | ICD-10-CM | POA: Diagnosis present

## 2021-10-27 DIAGNOSIS — Z9981 Dependence on supplemental oxygen: Secondary | ICD-10-CM

## 2021-10-27 DIAGNOSIS — J90 Pleural effusion, not elsewhere classified: Secondary | ICD-10-CM | POA: Diagnosis not present

## 2021-10-27 DIAGNOSIS — R6889 Other general symptoms and signs: Secondary | ICD-10-CM | POA: Diagnosis not present

## 2021-10-27 DIAGNOSIS — Z811 Family history of alcohol abuse and dependence: Secondary | ICD-10-CM

## 2021-10-27 DIAGNOSIS — R627 Adult failure to thrive: Secondary | ICD-10-CM | POA: Diagnosis present

## 2021-10-27 DIAGNOSIS — E785 Hyperlipidemia, unspecified: Secondary | ICD-10-CM | POA: Diagnosis not present

## 2021-10-27 DIAGNOSIS — G629 Polyneuropathy, unspecified: Secondary | ICD-10-CM | POA: Diagnosis present

## 2021-10-27 DIAGNOSIS — Z7401 Bed confinement status: Secondary | ICD-10-CM | POA: Diagnosis not present

## 2021-10-27 DIAGNOSIS — E669 Obesity, unspecified: Secondary | ICD-10-CM | POA: Diagnosis present

## 2021-10-27 DIAGNOSIS — I110 Hypertensive heart disease with heart failure: Secondary | ICD-10-CM | POA: Diagnosis not present

## 2021-10-27 DIAGNOSIS — J189 Pneumonia, unspecified organism: Principal | ICD-10-CM

## 2021-10-27 DIAGNOSIS — J9622 Acute and chronic respiratory failure with hypercapnia: Secondary | ICD-10-CM | POA: Diagnosis present

## 2021-10-27 DIAGNOSIS — R1310 Dysphagia, unspecified: Secondary | ICD-10-CM | POA: Diagnosis present

## 2021-10-27 DIAGNOSIS — R0603 Acute respiratory distress: Secondary | ICD-10-CM | POA: Diagnosis not present

## 2021-10-27 DIAGNOSIS — I5032 Chronic diastolic (congestive) heart failure: Secondary | ICD-10-CM | POA: Diagnosis not present

## 2021-10-27 DIAGNOSIS — J449 Chronic obstructive pulmonary disease, unspecified: Secondary | ICD-10-CM | POA: Diagnosis not present

## 2021-10-27 DIAGNOSIS — J9611 Chronic respiratory failure with hypoxia: Secondary | ICD-10-CM | POA: Diagnosis not present

## 2021-10-27 DIAGNOSIS — Z6832 Body mass index (BMI) 32.0-32.9, adult: Secondary | ICD-10-CM

## 2021-10-27 DIAGNOSIS — J440 Chronic obstructive pulmonary disease with acute lower respiratory infection: Secondary | ICD-10-CM | POA: Diagnosis not present

## 2021-10-27 DIAGNOSIS — I272 Pulmonary hypertension, unspecified: Secondary | ICD-10-CM | POA: Diagnosis not present

## 2021-10-27 DIAGNOSIS — Z20822 Contact with and (suspected) exposure to covid-19: Secondary | ICD-10-CM | POA: Diagnosis not present

## 2021-10-27 DIAGNOSIS — E876 Hypokalemia: Secondary | ICD-10-CM | POA: Diagnosis present

## 2021-10-27 DIAGNOSIS — I248 Other forms of acute ischemic heart disease: Secondary | ICD-10-CM | POA: Diagnosis present

## 2021-10-27 DIAGNOSIS — D631 Anemia in chronic kidney disease: Secondary | ICD-10-CM | POA: Diagnosis not present

## 2021-10-27 DIAGNOSIS — I5033 Acute on chronic diastolic (congestive) heart failure: Secondary | ICD-10-CM | POA: Diagnosis not present

## 2021-10-27 DIAGNOSIS — Z992 Dependence on renal dialysis: Secondary | ICD-10-CM | POA: Diagnosis not present

## 2021-10-27 DIAGNOSIS — R0902 Hypoxemia: Secondary | ICD-10-CM

## 2021-10-27 DIAGNOSIS — N186 End stage renal disease: Secondary | ICD-10-CM | POA: Diagnosis present

## 2021-10-27 DIAGNOSIS — M6281 Muscle weakness (generalized): Secondary | ICD-10-CM | POA: Diagnosis not present

## 2021-10-27 DIAGNOSIS — Z79899 Other long term (current) drug therapy: Secondary | ICD-10-CM

## 2021-10-27 DIAGNOSIS — R54 Age-related physical debility: Secondary | ICD-10-CM | POA: Diagnosis present

## 2021-10-27 DIAGNOSIS — L8990 Pressure ulcer of unspecified site, unspecified stage: Secondary | ICD-10-CM | POA: Diagnosis present

## 2021-10-27 DIAGNOSIS — G9341 Metabolic encephalopathy: Secondary | ICD-10-CM | POA: Diagnosis present

## 2021-10-27 DIAGNOSIS — Z87891 Personal history of nicotine dependence: Secondary | ICD-10-CM

## 2021-10-27 DIAGNOSIS — D696 Thrombocytopenia, unspecified: Secondary | ICD-10-CM | POA: Diagnosis not present

## 2021-10-27 DIAGNOSIS — G8929 Other chronic pain: Secondary | ICD-10-CM | POA: Diagnosis present

## 2021-10-27 DIAGNOSIS — Z8673 Personal history of transient ischemic attack (TIA), and cerebral infarction without residual deficits: Secondary | ICD-10-CM

## 2021-10-27 DIAGNOSIS — Z885 Allergy status to narcotic agent status: Secondary | ICD-10-CM

## 2021-10-27 DIAGNOSIS — J9811 Atelectasis: Secondary | ICD-10-CM | POA: Diagnosis not present

## 2021-10-27 LAB — RESP PANEL BY RT-PCR (FLU A&B, COVID) ARPGX2
Influenza A by PCR: NEGATIVE
Influenza B by PCR: NEGATIVE
SARS Coronavirus 2 by RT PCR: NEGATIVE

## 2021-10-27 LAB — COMPREHENSIVE METABOLIC PANEL
ALT: 32 U/L (ref 0–44)
AST: 29 U/L (ref 15–41)
Albumin: 4.1 g/dL (ref 3.5–5.0)
Alkaline Phosphatase: 109 U/L (ref 38–126)
Anion gap: 19 — ABNORMAL HIGH (ref 5–15)
BUN: 43 mg/dL — ABNORMAL HIGH (ref 8–23)
CO2: 22 mmol/L (ref 22–32)
Calcium: 9.7 mg/dL (ref 8.9–10.3)
Chloride: 93 mmol/L — ABNORMAL LOW (ref 98–111)
Creatinine, Ser: 2.76 mg/dL — ABNORMAL HIGH (ref 0.44–1.00)
GFR, Estimated: 17 mL/min — ABNORMAL LOW (ref >60)
Glucose, Bld: 176 mg/dL — ABNORMAL HIGH (ref 70–99)
Potassium: 4.1 mmol/L (ref 3.5–5.1)
Sodium: 134 mmol/L — ABNORMAL LOW (ref 135–145)
Total Bilirubin: 1.3 mg/dL — ABNORMAL HIGH (ref 0.3–1.2)
Total Protein: 7.3 g/dL (ref 6.5–8.1)

## 2021-10-27 LAB — CBC
HCT: 43.2 % (ref 36.0–46.0)
Hemoglobin: 12.9 g/dL (ref 12.0–15.0)
MCH: 32.4 pg (ref 26.0–34.0)
MCHC: 29.9 g/dL — ABNORMAL LOW (ref 30.0–36.0)
MCV: 108.5 fL — ABNORMAL HIGH (ref 80.0–100.0)
Platelets: 107 10*3/uL — ABNORMAL LOW (ref 150–400)
RBC: 3.98 MIL/uL (ref 3.87–5.11)
RDW: 17.5 % — ABNORMAL HIGH (ref 11.5–15.5)
WBC: 20.8 10*3/uL — ABNORMAL HIGH (ref 4.0–10.5)
nRBC: 0.2 % (ref 0.0–0.2)

## 2021-10-27 LAB — BLOOD GAS, VENOUS
Acid-Base Excess: 4.1 mmol/L — ABNORMAL HIGH (ref 0.0–2.0)
Bicarbonate: 32.7 mmol/L — ABNORMAL HIGH (ref 20.0–28.0)
Drawn by: 7222
O2 Saturation: 39.4 %
Patient temperature: 36.4
pCO2, Ven: 66 mmHg — ABNORMAL HIGH (ref 44–60)
pH, Ven: 7.3 (ref 7.25–7.43)
pO2, Ven: 31 mmHg — CL (ref 32–45)

## 2021-10-27 LAB — TROPONIN I (HIGH SENSITIVITY): Troponin I (High Sensitivity): 101 ng/L (ref <18)

## 2021-10-27 LAB — BRAIN NATRIURETIC PEPTIDE: B Natriuretic Peptide: 4399 pg/mL — ABNORMAL HIGH (ref 0.0–100.0)

## 2021-10-27 MED ORDER — ONDANSETRON HCL 4 MG PO TABS: 4.0000 mg | ORAL_TABLET | Freq: Four times a day (QID) | ORAL | Status: DC | PRN

## 2021-10-27 MED ORDER — FERROUS SULFATE 325 (65 FE) MG PO TABS
325.0000 mg | ORAL_TABLET | Freq: Two times a day (BID) | ORAL | Status: DC
  Administered 2021-10-28: 325 mg via ORAL
  Filled 2021-10-27: qty 1

## 2021-10-27 MED ORDER — PRAVASTATIN SODIUM 40 MG PO TABS
40.0000 mg | ORAL_TABLET | Freq: Every day | ORAL | Status: DC
  Administered 2021-10-28 – 2021-11-03 (×7): 40 mg via ORAL
  Filled 2021-10-27 (×7): qty 1

## 2021-10-27 MED ORDER — SODIUM CHLORIDE 0.9 % IV SOLN
1.0000 g | INTRAVENOUS | Status: AC
  Administered 2021-10-27: 1 g via INTRAVENOUS
  Filled 2021-10-27: qty 10

## 2021-10-27 MED ORDER — OXYCODONE HCL 5 MG PO TABS: 5.0000 mg | ORAL_TABLET | ORAL | Status: DC | PRN

## 2021-10-27 MED ORDER — MIDODRINE HCL 5 MG PO TABS: 10.0000 mg | ORAL_TABLET | Freq: Three times a day (TID) | ORAL | Status: DC

## 2021-10-27 MED ORDER — ACETAMINOPHEN 325 MG PO TABS
650.0000 mg | ORAL_TABLET | Freq: Four times a day (QID) | ORAL | Status: DC | PRN
  Administered 2021-10-30: 650 mg via ORAL
  Filled 2021-10-27: qty 2

## 2021-10-27 MED ORDER — ASPIRIN 81 MG PO CHEW
162.0000 mg | CHEWABLE_TABLET | ORAL | Status: AC
  Administered 2021-10-27: 162 mg via ORAL
  Filled 2021-10-27: qty 2

## 2021-10-27 MED ORDER — MORPHINE SULFATE (PF) 2 MG/ML IV SOLN: 2.0000 mg | INTRAVENOUS | Status: DC | PRN

## 2021-10-27 MED ORDER — ALPRAZOLAM 0.5 MG PO TABS: 0.2500 mg | ORAL_TABLET | Freq: Three times a day (TID) | ORAL | Status: DC | PRN

## 2021-10-27 MED ORDER — SODIUM CHLORIDE 0.9 % IV SOLN
500.0000 mg | INTRAVENOUS | Status: DC
  Administered 2021-10-27 – 2021-10-30 (×4): 500 mg via INTRAVENOUS
  Filled 2021-10-27 (×4): qty 5

## 2021-10-27 MED ORDER — ALBUTEROL SULFATE (2.5 MG/3ML) 0.083% IN NEBU
2.5000 mg | INHALATION_SOLUTION | Freq: Four times a day (QID) | RESPIRATORY_TRACT | Status: DC | PRN
  Filled 2021-10-27: qty 3

## 2021-10-27 MED ORDER — SEVELAMER CARBONATE 800 MG PO TABS
800.0000 mg | ORAL_TABLET | Freq: Three times a day (TID) | ORAL | Status: DC
  Administered 2021-10-28 – 2021-11-03 (×18): 800 mg via ORAL
  Filled 2021-10-27 (×20): qty 1

## 2021-10-27 MED ORDER — APIXABAN 2.5 MG PO TABS
2.5000 mg | ORAL_TABLET | Freq: Two times a day (BID) | ORAL | Status: DC
  Administered 2021-10-28 – 2021-11-03 (×13): 2.5 mg via ORAL
  Filled 2021-10-27 (×13): qty 1

## 2021-10-27 MED ORDER — ACETAMINOPHEN 650 MG RE SUPP: 650.0000 mg | Freq: Four times a day (QID) | RECTAL | Status: DC | PRN

## 2021-10-27 MED ORDER — METOPROLOL SUCCINATE ER 25 MG PO TB24: 25.0000 mg | ORAL_TABLET | Freq: Every day | ORAL | Status: DC

## 2021-10-27 MED ORDER — PREDNISONE 50 MG PO TABS: 50.0000 mg | ORAL_TABLET | Freq: Every day | ORAL | Status: DC

## 2021-10-27 MED ORDER — NEPRO/CARBSTEADY PO LIQD
237.0000 mL | Freq: Two times a day (BID) | ORAL | Status: DC
  Administered 2021-10-28 – 2021-11-03 (×10): 237 mL via ORAL

## 2021-10-27 MED ORDER — FOLIC ACID 1 MG PO TABS
1.0000 mg | ORAL_TABLET | Freq: Every day | ORAL | Status: DC
  Administered 2021-10-28 – 2021-11-03 (×7): 1 mg via ORAL
  Filled 2021-10-27 (×7): qty 1

## 2021-10-27 MED ORDER — ONDANSETRON HCL 4 MG/2ML IJ SOLN: 4.0000 mg | Freq: Four times a day (QID) | INTRAMUSCULAR | Status: DC | PRN

## 2021-10-27 MED ORDER — GABAPENTIN 300 MG PO CAPS
300.0000 mg | ORAL_CAPSULE | Freq: Every day | ORAL | Status: DC
  Administered 2021-10-28 – 2021-11-02 (×6): 300 mg via ORAL
  Filled 2021-10-27 (×6): qty 1

## 2021-10-27 NOTE — ED Provider Notes (Signed)
[name] EMERGENCY DEPARTMENT Provider Note   CSN: [id number] Arrival date & time: 10/27/21  1750     History {Add pertinent medical, surgical, social history, OB history to HPI:1} No chief complaint on file.   Jody [name] is a 81 y.o. female.  81 yo F with hx of COPD on 3L home O2, CHF, and ESRD on TTS iHD who presented from [facility] with hypoxia and SOB. States her difficulty breathing started after iHD. Also with mild headache. + Productive cough. No fevers or chills. Says she has been compliant with her sessions and has been getting full sessions every time. Does not make urine. At her facility was noted to be statting in the 43s and was brought here for evaluation.         Home Medications Prior to Admission medications   Medication Sig Start Date End Date Taking? Authorizing Provider  acetaminophen (TYLENOL) 325 MG tablet Take 325 mg by mouth 3 (three) times daily.    [provider]  albuterol (PROVENTIL) (2.5 MG/3ML) 0.083% nebulizer solution Take 2.5 mg by nebulization every 6 (six) hours as needed for wheezing or shortness of breath.    [provider]  albuterol (VENTOLIN HFA) 108 (90 Base) MCG/ACT inhaler Inhale 2 puffs into the lungs every 6 (six) hours as needed for wheezing or shortness of breath. Reported on 03/01/2015 Patient taking differently: Inhale 1-2 puffs into the lungs every 6 (six) hours as needed for wheezing or shortness of breath. Reported on 03/01/2015 07/30/21   Jody Hockey, MD  apixaban (ELIQUIS) 2.5 MG TABS tablet Take 2.5 mg by mouth 2 (two) times daily.    [provider]  calcitRIOL (ROCALTROL) 0.5 MCG capsule Take 1 capsule (0.5 mcg total) by mouth Every Tuesday,Thursday,and Saturday with dialysis. 10/25/21   Jody Eva, MD  cinacalcet (SENSIPAR) 30 MG tablet Take 1 tablet (30 mg total) by mouth Every Tuesday,Thursday,and Saturday with dialysis. 10/25/21   Jody Eva, MD  dextromethorphan-guaiFENesin  [facility] DM) 30-600 MG 12hr tablet Take 1 tablet by mouth 2 (two) times daily. 07/30/21   Jody Hockey, MD  ferrous sulfate 325 (65 FE) MG tablet Take 325 mg by mouth 2 (two) times daily with a meal.    [provider]  folic acid (FOLVITE) 676 MCG tablet Take 800 mcg by mouth daily.    [provider]  gabapentin (NEURONTIN) 300 MG capsule Take 300 mg by mouth daily. 05/11/21   [provider]  influenza vac split quadrivalent PF (FLULAVAL QUADRIVALENT) 0.5 ML injection Inject 0.5 mLs into the muscle once.    [provider]  ipratropium-albuterol (DUONEB) 0.5-2.5 (3) MG/3ML SOLN Take 3 mLs by nebulization every 4 (four) hours as needed. 10/24/21   Jody Eva, MD  metoprolol succinate (TOPROL-XL) 25 MG 24 hr tablet Take 1 tablet (25 mg total) by mouth daily. 10/24/21   Jody Eva, MD  midodrine (PROAMATINE) 10 MG tablet Take 1 tablet (10 mg total) by mouth 3 (three) times daily with meals. 10/24/21   Jody Eva, MD  Multiple Vitamins-Minerals (PRESERVISION AREDS) TABS Take 1 tablet by mouth at bedtime.    [provider]  OXYGEN Inhale into the lungs. May use 2-4 liters    [provider]  pravastatin (PRAVACHOL) 40 MG tablet Take 40 mg by mouth daily. 09/28/21   [provider]  predniSONE (DELTASONE) 50 MG tablet Take 1 tablet (50 mg total) by mouth daily with breakfast. X 3 days 10/25/21   Tat,  Shanon Brow, MD  sevelamer carbonate (RENVELA) 800 MG tablet Take 800 mg by mouth 3 (three) times daily with meals.  07/22/19   [provider]      Allergies    Codeine    Review of Systems   Review of Systems  Physical Exam Updated Vital Signs There were no vitals taken for this visit. Physical Exam  ED Results / Procedures / Treatments   Labs (all labs ordered are listed, but only abnormal results are displayed) Labs Reviewed  RESP PANEL BY RT-PCR (FLU A&B, COVID) ARPGX2  COMPREHENSIVE METABOLIC PANEL  CBC  BRAIN NATRIURETIC  PEPTIDE  BLOOD GAS, ARTERIAL  TROPONIN I (HIGH SENSITIVITY)    EKG None  Radiology No results found.  Procedures Procedures  {Document cardiac monitor, telemetry assessment procedure when appropriate:1}  Medications Ordered in ED Medications - No data to display  ED Course/ Medical Decision Making/ A&P                           Medical Decision Making Amount and/or Complexity of Data Reviewed Labs: ordered. Radiology: ordered.  Risk OTC drugs.   ***  {Document critical care time when appropriate:1} {Document review of labs and clinical decision tools ie heart score, Chads2Vasc2 etc:1}  {Document your independent review of radiology images, and any outside records:1} {Document your discussion with family members, caretakers, and with consultants:1} {Document social determinants of health affecting pt's care:1} {Document your decision making why or why not admission, treatments were needed:1} Final Clinical Impression(s) / ED Diagnoses Final diagnoses:  None    Rx / DC Orders ED Discharge Orders     None

## 2021-10-27 NOTE — ED Notes (Signed)
EDP at bedside during triage 

## 2021-10-27 NOTE — ED Triage Notes (Signed)
Pt arrived from [facility] for SOB. Stating pt oxygen was 44% 3 lpm. Pt received dialysis today, '40mg'$  of Lasix prior to ED . Pt fingers cold and blue tinted,  bilateral pitting edema x 2 and cold.

## 2021-10-28 DIAGNOSIS — R778 Other specified abnormalities of plasma proteins: Secondary | ICD-10-CM

## 2021-10-28 DIAGNOSIS — N186 End stage renal disease: Secondary | ICD-10-CM | POA: Diagnosis not present

## 2021-10-28 DIAGNOSIS — J449 Chronic obstructive pulmonary disease, unspecified: Secondary | ICD-10-CM

## 2021-10-28 DIAGNOSIS — G9341 Metabolic encephalopathy: Secondary | ICD-10-CM

## 2021-10-28 DIAGNOSIS — J9601 Acute respiratory failure with hypoxia: Secondary | ICD-10-CM | POA: Diagnosis not present

## 2021-10-28 DIAGNOSIS — E785 Hyperlipidemia, unspecified: Secondary | ICD-10-CM

## 2021-10-28 DIAGNOSIS — Z992 Dependence on renal dialysis: Secondary | ICD-10-CM

## 2021-10-28 DIAGNOSIS — I4819 Other persistent atrial fibrillation: Secondary | ICD-10-CM

## 2021-10-28 DIAGNOSIS — I5023 Acute on chronic systolic (congestive) heart failure: Secondary | ICD-10-CM

## 2021-10-28 DIAGNOSIS — J189 Pneumonia, unspecified organism: Secondary | ICD-10-CM

## 2021-10-28 LAB — CBC WITH DIFFERENTIAL/PLATELET
Abs Immature Granulocytes: 0.11 10*3/uL — ABNORMAL HIGH (ref 0.00–0.07)
Basophils Absolute: 0 10*3/uL (ref 0.0–0.1)
Basophils Relative: 0 %
Eosinophils Absolute: 0 10*3/uL (ref 0.0–0.5)
Eosinophils Relative: 0 %
HCT: 42.4 % (ref 36.0–46.0)
Hemoglobin: 12.4 g/dL (ref 12.0–15.0)
Immature Granulocytes: 1 %
Lymphocytes Relative: 3 %
Lymphs Abs: 0.6 10*3/uL — ABNORMAL LOW (ref 0.7–4.0)
MCH: 32.2 pg (ref 26.0–34.0)
MCHC: 29.2 g/dL — ABNORMAL LOW (ref 30.0–36.0)
MCV: 110.1 fL — ABNORMAL HIGH (ref 80.0–100.0)
Monocytes Absolute: 1.3 10*3/uL — ABNORMAL HIGH (ref 0.1–1.0)
Monocytes Relative: 7 %
Neutro Abs: 16.7 10*3/uL — ABNORMAL HIGH (ref 1.7–7.7)
Neutrophils Relative %: 89 %
Platelets: 143 10*3/uL — ABNORMAL LOW (ref 150–400)
RBC: 3.85 MIL/uL — ABNORMAL LOW (ref 3.87–5.11)
RDW: 17.9 % — ABNORMAL HIGH (ref 11.5–15.5)
WBC: 18.7 10*3/uL — ABNORMAL HIGH (ref 4.0–10.5)
nRBC: 0.5 % — ABNORMAL HIGH (ref 0.0–0.2)

## 2021-10-28 LAB — BLOOD GAS, ARTERIAL
Acid-base deficit: 1.4 mmol/L (ref 0.0–2.0)
Acid-base deficit: 8.3 mmol/L — ABNORMAL HIGH (ref 0.0–2.0)
Bicarbonate: 21.2 mmol/L (ref 20.0–28.0)
Bicarbonate: 27.4 mmol/L (ref 20.0–28.0)
Drawn by: 27407
Drawn by: 38235
FIO2: 100 %
FIO2: 100 %
O2 Saturation: 82.8 %
O2 Saturation: 99.8 %
Patient temperature: 36.4
Patient temperature: 37
pCO2 arterial: 60 mmHg — ABNORMAL HIGH (ref 32–48)
pCO2 arterial: 64 mmHg — ABNORMAL HIGH (ref 32–48)
pH, Arterial: 7.16 — CL (ref 7.35–7.45)
pH, Arterial: 7.24 — ABNORMAL LOW (ref 7.35–7.45)
pO2, Arterial: 222 mmHg — ABNORMAL HIGH (ref 83–108)
pO2, Arterial: 58 mmHg — ABNORMAL LOW (ref 83–108)

## 2021-10-28 LAB — COMPREHENSIVE METABOLIC PANEL
ALT: 163 U/L — ABNORMAL HIGH (ref 0–44)
AST: 133 U/L — ABNORMAL HIGH (ref 15–41)
Albumin: 3.9 g/dL (ref 3.5–5.0)
Alkaline Phosphatase: 111 U/L (ref 38–126)
Anion gap: 15 (ref 5–15)
BUN: 48 mg/dL — ABNORMAL HIGH (ref 8–23)
CO2: 24 mmol/L (ref 22–32)
Calcium: 9.5 mg/dL (ref 8.9–10.3)
Chloride: 96 mmol/L — ABNORMAL LOW (ref 98–111)
Creatinine, Ser: 2.91 mg/dL — ABNORMAL HIGH (ref 0.44–1.00)
GFR, Estimated: 16 mL/min — ABNORMAL LOW (ref >60)
Glucose, Bld: 160 mg/dL — ABNORMAL HIGH (ref 70–99)
Potassium: 4.7 mmol/L (ref 3.5–5.1)
Sodium: 135 mmol/L (ref 135–145)
Total Bilirubin: 1.1 mg/dL (ref 0.3–1.2)
Total Protein: 7 g/dL (ref 6.5–8.1)

## 2021-10-28 LAB — TROPONIN I (HIGH SENSITIVITY)
Troponin I (High Sensitivity): 106 ng/L (ref <18)
Troponin I (High Sensitivity): 136 ng/L (ref <18)
Troponin I (High Sensitivity): 146 ng/L (ref <18)

## 2021-10-28 LAB — MAGNESIUM: Magnesium: 2.7 mg/dL — ABNORMAL HIGH (ref 1.7–2.4)

## 2021-10-28 LAB — HEPATITIS B SURFACE ANTIGEN: Hepatitis B Surface Ag: NONREACTIVE

## 2021-10-28 LAB — HEPATITIS B SURFACE ANTIBODY,QUALITATIVE: Hep B S Ab: REACTIVE — AB

## 2021-10-28 LAB — PROCALCITONIN: Procalcitonin: 8.46 ng/mL

## 2021-10-28 LAB — HEPATITIS B CORE ANTIBODY, TOTAL: Hep B Core Total Ab: NONREACTIVE

## 2021-10-28 LAB — HEPATITIS C ANTIBODY: HCV Ab: NONREACTIVE

## 2021-10-28 LAB — GLUCOSE, CAPILLARY: Glucose-Capillary: 155 mg/dL — ABNORMAL HIGH (ref 70–99)

## 2021-10-28 MED ORDER — CHLORHEXIDINE GLUCONATE CLOTH 2 % EX PADS
6.0000 | MEDICATED_PAD | Freq: Every day | CUTANEOUS | Status: DC
  Administered 2021-10-29 – 2021-11-03 (×6): 6 via TOPICAL

## 2021-10-28 MED ORDER — MIDODRINE HCL 5 MG PO TABS
10.0000 mg | ORAL_TABLET | Freq: Three times a day (TID) | ORAL | Status: DC
  Administered 2021-10-28 (×2): 10 mg via ORAL
  Filled 2021-10-28 (×2): qty 2

## 2021-10-28 MED ORDER — IPRATROPIUM-ALBUTEROL 0.5-2.5 (3) MG/3ML IN SOLN
3.0000 mL | Freq: Four times a day (QID) | RESPIRATORY_TRACT | Status: DC
  Administered 2021-10-28 – 2021-10-29 (×6): 3 mL via RESPIRATORY_TRACT
  Filled 2021-10-28 (×6): qty 3

## 2021-10-28 MED ORDER — MIDODRINE HCL 5 MG PO TABS
10.0000 mg | ORAL_TABLET | Freq: Once | ORAL | Status: AC
  Administered 2021-10-28: 10 mg via ORAL
  Filled 2021-10-28: qty 2

## 2021-10-28 MED ORDER — SODIUM CHLORIDE 0.9 % IV SOLN
1.0000 g | INTRAVENOUS | Status: DC
  Administered 2021-10-28 – 2021-10-30 (×3): 1 g via INTRAVENOUS
  Filled 2021-10-28 (×3): qty 10

## 2021-10-28 MED ORDER — FENTANYL CITRATE PF 50 MCG/ML IJ SOSY: 12.5000 ug | PREFILLED_SYRINGE | INTRAMUSCULAR | Status: DC | PRN

## 2021-10-28 MED ORDER — CHLORHEXIDINE GLUCONATE CLOTH 2 % EX PADS
6.0000 | MEDICATED_PAD | Freq: Every day | CUTANEOUS | Status: DC
  Administered 2021-10-28 – 2021-11-03 (×7): 6 via TOPICAL

## 2021-10-28 MED ORDER — CINACALCET HCL 30 MG PO TABS
30.0000 mg | ORAL_TABLET | ORAL | Status: DC
  Administered 2021-10-29 – 2021-11-03 (×3): 30 mg via ORAL
  Filled 2021-10-28 (×4): qty 1

## 2021-10-28 MED ORDER — OXYCODONE HCL 5 MG PO TABS
5.0000 mg | ORAL_TABLET | Freq: Three times a day (TID) | ORAL | Status: DC | PRN
  Administered 2021-10-29: 5 mg via ORAL
  Filled 2021-10-28: qty 1

## 2021-10-28 MED ORDER — NYSTATIN 100000 UNIT/ML MT SUSP
5.0000 mL | Freq: Four times a day (QID) | OROMUCOSAL | Status: DC
  Administered 2021-10-28 – 2021-11-03 (×25): 500000 [IU] via ORAL
  Filled 2021-10-28 (×16): qty 5

## 2021-10-28 MED ORDER — MIDODRINE HCL 5 MG PO TABS
15.0000 mg | ORAL_TABLET | Freq: Three times a day (TID) | ORAL | Status: DC
  Administered 2021-10-28 – 2021-11-03 (×19): 15 mg via ORAL
  Filled 2021-10-28 (×19): qty 3

## 2021-10-28 MED ORDER — CALCITRIOL 0.25 MCG PO CAPS
0.5000 ug | ORAL_CAPSULE | ORAL | Status: DC
  Administered 2021-10-29 – 2021-11-03 (×3): 0.5 ug via ORAL
  Filled 2021-10-28 (×4): qty 2

## 2021-10-28 MED ORDER — METOPROLOL SUCCINATE ER 25 MG PO TB24
12.5000 mg | ORAL_TABLET | Freq: Every day | ORAL | Status: DC
  Administered 2021-10-29 – 2021-11-03 (×5): 12.5 mg via ORAL
  Filled 2021-10-28 (×5): qty 1

## 2021-10-28 NOTE — H&P (Signed)
History and Physical    Patient: Jody [name] ZTI:[id number] DOB: 1940-10-17 DOA: 10/27/2021 DOS: the patient was seen and examined on 10/28/2021 PCP: Monico Blitz, MD  Patient coming from:  rehab  Chief Complaint:  Chief Complaint  Patient presents with   Shortness of Breath   HPI: Jody [name] is a 81 y.o. female with medical history significant of ESRD, chronic atrial fibrillation, COPD, hyperlipidemia, hypertension, diet-controlled diabetes mellitus type 2, and more presents to the ED with a chief complaint of dyspnea.  Unfortunately patient is too altered at the time of my exam to give any history.  She was more alert on first presentation to the ER.  She had told the ER staff that she started to have shortness of breath when dialysis finished.  They checked her oxygen on her baseline 3 L nasal cannula and it was 60%.  She came into the ER on nonrebreather.  Work-up revealed a likely pneumonia and she was started on Rocephin and Zithromax.  Patient slowly became more somnolent while she was in the ER.  When I saw her she would not speak, but would look in the eye, and open her eyes to voice.  Patient is DNR.  BiPAP was started.  ABG showed a hypercarbia.  Will admit to stepdown on BiPAP.  Patient does get dialysis Tuesday, Thursday, Saturday.  She appears clinically fluid overloaded.  Patient is an uric so Lasix is not prescribed.  Patient does have an elevated but stable troponin that is likely demand ischemia in the setting of hypoxia.  Patient is not able to discuss any complaints at this time.  Patient has DNR form at bedside.  It is noted the patient was recently discharged 4 days ago.  At that admission patient was also volume overloaded.  Her dialysis sessions have been cut short due to leg pain.  She did have a cough productive of white sputum at that time.  She was hypoxic as well with her oxygen sats down to 70s on her baseline 3 L nasal cannula.  Her troponins were lower at that  time and 50, 24 during that hospitalization patient continued to have episodes of shortness of breath on her nondialysis days.  This was thought to be multifactorial with COPD, anxiety, and hypervolemia all contributing.  Respiratory reports the patient was calling out every hour for breathing treatment, and they believed it to be anxiety related.  They do note that her somnolence now is a change from her baseline. Review of Systems: unable to review all systems due to the inability of the patient to answer questions. Past Medical History:  Diagnosis Date   Anemia in chronic kidney disease (CODE) 06/05/2015   Arthritis    "bad in my legs" (12/07/2014)   Chronic atrial fibrillation ([place]) 06/05/2015   Chronic back pain    "dr said my spine is crooked"   Chronic bronchitis ([name])    "get it q yr" (82/50/5397)   Complication of anesthesia    headache for 2 days after surgery in May 2019   COPD (chronic obstructive pulmonary disease) ([facility])    ESRD on hemodialysis ([place]) 06/05/2015   Gait difficulty 09/02/2014   Hypercholesterolemia    Hypertension    Neuropathy 2015   in legs   NSVT (nonsustained ventricular tachycardia) ([place])    Sinus brady-tachy syndrome ([place])    Stroke ([place]) 05/2014   denies residual on 12/07/2014   Type II diabetes mellitus ([place])    Past Surgical History:  Procedure Laterality Date   ABDOMINAL AORTOGRAM W/LOWER EXTREMITY Left 04/06/2021   Procedure: ABDOMINAL AORTOGRAM W/LOWER EXTREMITY;  Surgeon: Broadus John, MD;  Location: [place] [facility];  Service: Cardiovascular;  Laterality: Left;   ABDOMINAL HYSTERECTOMY  ~ 1970   AMPUTATION TOE Left 04/08/2021   Procedure: FIRST TOE LEFT FOOT AMPUTATION;  Surgeon: Trula Slade, DPM;  Location: [place];  Service: Podiatry;  Laterality: Left;   APPENDECTOMY  ~ [place] Right 07/06/2017   Procedure: RIGHT RADIOCEPHALIC ARTERIOVENOUS FISTULA creation;  Surgeon: Conrad [place], MD;  Location: [place];   Service: Vascular;  Laterality: Right;   [place] Right 09/17/2017   Procedure: SECOND STAGE BASILIC VEIN TRANSPOSITION RIGHT ARM;  Surgeon: Rosetta Posner, MD;  Location: [place];  Service: Vascular;  Laterality: Right;   COLONOSCOPY N/A 10/28/2017   Procedure: COLONOSCOPY;  Surgeon: Rogene Houston, MD;  [facility];  Service: Endoscopy;  Laterality: N/A;   COLONOSCOPY WITH PROPOFOL N/A 12/29/2019   Procedure: COLONOSCOPY WITH PROPOFOL;  Surgeon: Eloise Harman, DO;  [facility];  Service: Endoscopy;  Laterality: N/A;   ESOPHAGOGASTRODUODENOSCOPY (EGD) WITH PROPOFOL N/A 10/31/2017   Procedure: ESOPHAGOGASTRODUODENOSCOPY (EGD) WITH PROPOFOL;  Surgeon: Rogene Houston, MD;  [facility];  Service: Endoscopy;  Laterality: N/A;   JOINT REPLACEMENT     PERIPHERAL VASCULAR BALLOON ANGIOPLASTY Left 04/06/2021   Procedure: PERIPHERAL VASCULAR BALLOON ANGIOPLASTY;  Surgeon: Broadus John, MD;  Location: [place] [facility];  Service: Cardiovascular;  Laterality: Left;  left sfa succesful [place] AT Unsuccesful   TOTAL KNEE ARTHROPLASTY Right ~ [place]  ~ 1970   "9# in my stomach"   Social History:  reports that she quit smoking about 17 years ago. Her smoking use included cigarettes. She started smoking about 68 years ago. She has a 25.00 pack-year smoking history. She has never been exposed to tobacco smoke. She has never used smokeless tobacco. She reports that she does not drink alcohol and does not use drugs.  Allergies  Allergen Reactions   Codeine Nausea And Vomiting    Family History  Problem Relation Age of Onset   Cancer Other    Heart attack Brother    Cancer Sister        breast   Alcohol abuse Brother     Prior to Admission medications   Medication Sig Start Date End Date Taking? Authorizing Provider  albuterol (PROVENTIL) (2.5 MG/3ML) 0.083% nebulizer solution Take 2.5 mg by nebulization every 6 (six) hours as  needed for wheezing or shortness of breath.   Yes [provider]  albuterol (VENTOLIN HFA) 108 (90 Base) MCG/ACT inhaler Inhale 2 puffs into the lungs every 6 (six) hours as needed for wheezing or shortness of breath. Reported on 03/01/2015 Patient taking differently: Inhale 1-2 puffs into the lungs every 6 (six) hours as needed for wheezing or shortness of breath. Reported on 03/01/2015 07/30/21  Yes Emokpae, Courage, MD  ALPRAZolam (XANAX) 0.25 MG tablet Take 0.25 mg by mouth 3 (three) times daily as needed. 10/26/21  Yes [provider]  apixaban (ELIQUIS) 2.5 MG TABS tablet Take 2.5 mg by mouth 2 (two) times daily.   Yes [provider]  dextromethorphan-guaiFENesin (MUCINEX DM) 30-600 MG 12hr tablet Take 1 tablet by mouth 2 (two) times daily. 07/30/21  Yes Emokpae, Courage, MD  ferrous sulfate 325 (65 FE) MG tablet Take 325 mg by  mouth 2 (two) times daily with a meal.   Yes [provider]  folic acid (FOLVITE) 993 MCG tablet Take 800 mcg by mouth daily.   Yes [provider]  gabapentin (NEURONTIN) 300 MG capsule Take 300 mg by mouth daily. 05/11/21  Yes [provider]  ipratropium-albuterol (DUONEB) 0.5-2.5 (3) MG/3ML SOLN Take 3 mLs by nebulization every 4 (four) hours as needed. 10/24/21  Yes Tat, Shanon Brow, MD  lidocaine-prilocaine (EMLA) cream Apply 1 Application topically as needed (pain).   Yes [provider]  metoprolol succinate (TOPROL-XL) 25 MG 24 hr tablet Take 1 tablet (25 mg total) by mouth daily. 10/24/21  Yes Tat, Shanon Brow, MD  midodrine (PROAMATINE) 10 MG tablet Take 1 tablet (10 mg total) by mouth 3 (three) times daily with meals. 10/24/21  Yes Tat, Shanon Brow, MD  Multiple Vitamins-Minerals (PRESERVISION AREDS) TABS Take 1 tablet by mouth at bedtime.   Yes [provider]  Nutritional Supplements (FEEDING SUPPLEMENT, NEPRO CARB STEADY,) LIQD Take 237 mLs by mouth 2 (two) times daily between meals.   Yes [provider]  OXYGEN Inhale into the lungs. May use 2-4 liters   Yes [provider]  pravastatin (PRAVACHOL) 40 MG tablet Take 40 mg by mouth daily. 09/28/21  Yes [provider]  predniSONE (DELTASONE) 20 MG tablet  09/02/21  Yes [provider]  predniSONE (DELTASONE) 50 MG tablet Take 1 tablet (50 mg total) by mouth daily with breakfast. X 3 days 10/25/21  Yes Tat, Shanon Brow, MD  sevelamer carbonate (RENVELA) 800 MG tablet Take 800 mg by mouth 3 (three) times daily with meals.  07/22/19  Yes [provider]  Skin Protectants, Misc. (CALAZIME SKIN PROTECTANT EX) Apply 1 Application topically 3 (three) times daily. Apply to sacrum and right buttocks every shift until healed   Yes [provider]  acetaminophen (TYLENOL) 325 MG tablet Take 325 mg by mouth 3 (three) times daily.    [provider]  calcitRIOL (ROCALTROL) 0.5 MCG capsule Take 1 capsule (0.5 mcg total) by mouth Every Tuesday,Thursday,and Saturday with dialysis. Patient not taking: Reported on 10/27/2021 10/25/21   Orson Eva, MD  cinacalcet (SENSIPAR) 30 MG tablet Take 1 tablet (30 mg total) by mouth Every Tuesday,Thursday,and Saturday with dialysis. Patient not taking: Reported on 10/27/2021 10/25/21   Orson Eva, MD  [facility] DIGIHALER 108 [phone] Base) MCG/ACT AEPB Inhale into the lungs. 10/27/21   [provider]    Physical Exam: Vitals:   10/27/21 2200 10/28/21 0002 10/28/21 0030 10/28/21 0055  BP: 110/73 125/63  95/68  Pulse: 100 88 88 96  Resp: 19 (!) 22 (!) 24 18  Temp:      TempSrc:      SpO2: 97% 98%  93%  Weight:      Height:       1.  General: Patient lying supine in bed, chronically ill-appearing, visibly short of breath   2. Psychiatric: Somnolent and nonverbal   3. Neurologic: Speech and language are normal, face is symmetric, moves all 4 extremities voluntarily, at baseline without acute deficits on limited exam   4. HEENMT:  Head is atraumatic, normocephalic,  pupils reactive to light, neck is supple, trachea is midline, mucous membranes are moist   5. Respiratory : Lungs are diminished bilaterally with no wheezing, no cyanosis, patient has increased work of breathing on nasal cannula and was transitioned to BiPAP at admission   6. Cardiovascular : Heart rate normal, rhythm is irregular, no rubs  or gallops, peripheral edema present, peripheral pulses palpated   7. Gastrointestinal:  Abdomen is soft, nondistended, nontender to palpation bowel sounds active, no masses or organomegaly palpated   8. Skin:  Skin is warm, dry and intact without rashes, acute lesions, or ulcers on limited exam   9.Musculoskeletal:  No acute deformities or trauma, no asymmetry in tone, peripheral edema present, peripheral pulses palpated, no tenderness to palpation in the extremities  Data Reviewed: In the ED Temp 97.6, heart rate 81-100, respiratory rate 18-22, blood pressure 110/66-120/99, satting 97-99% VBG shows a pH of 7.3, PO2 31, PCO2 66; ABG shows a pH of 7.16, PCO2 of 60, at the time of BiPAP being started - Patient has a leukocytosis of 20.8 this likely due to pneumonia and due to steroids Hemoglobin 12.9 Platelets 107 Chemistry shows a BUN 43, creatinine 2.76, glucose 176 BNP 4400 which is elevated compared to previous Troponin 101, repeat 106 COVID and flu negative Chest x-ray shows an elevation of the right hemidiaphragm with pneumonia difficult to exclude, cardiomegaly, pleural effusions Rocephin and Zithromax started in the ED Admission requested for acute respiratory failure with hypoxia  Assessment and Plan: ESRD on dialysis [facility]) - Tuesday Thursday Saturday dialysis - Patient is clinically fluid overloaded - BNP is 4400 - Consult nephro for fluid management - Continue Renvela - Continue renal diet with fluid restrictions - Continue to monitor  COPD (chronic obstructive pulmonary disease) (HCC) - pH 7.1, PCO2 60 - Continue albuterol as  needed - Scheduled DuoNebs - Continue BiPAP - Recently finished a course of 50 mg prednisone daily for 3 days - No wheezing - Continue to monitor  Persistent atrial fibrillation (HCC) - Continue metoprolol - Continue Eliquis - Continue to monitor  Elevated troponin - Elevated to 101, 106 - Continue cycle troponin - Continue aspirin and statin as well as beta-blocker - No acute ischemic changes on EKG - Likely demand ischemia in the setting of hypoxia  CAP (community acquired pneumonia) - Chest x-ray shows pneumonia difficult to exclude, leukocytosis of 20.8, respiratory failure - Procalcitonin 8.46 - Rocephin and Zithromax started in the ED - Continue Rocephin and Zithromax - Sputum culture, urine antigens for Legionella and strep - Unfortunately blood cultures were not done before antibiotics, patient states of fever we will do blood cultures at that time - COVID and flu negative - Continue to monitor  Acute respiratory failure with hypoxia ([place]) - Patient noted to be down to 60% oxygen saturation on baseline O2 3 L - Presented with nonrebreather - Related to COPD, fluid overload and Community-acquired pneumonia - Continue BiPAP - ABG shows a pH of 7.16, P CO2 of 60, PO2 58 - Trend ABG in the a.m. - Patient has HD on Tuesday, Thursday, Saturday at baseline, consult nephro for fluid management -Wean off BiPAP and O2 as tolerated-continue albuterol as needed - Continue to monitor  Acute metabolic encephalopathy - More somnolent throughout the ER stay - Multifactorial with pneumonia and respiratory failure with hypoxia and hypercapnia - See respective assessments  CHF exacerbation (HCC) - Cardiorenal syndrome, fluid overload, HD patient - BNP 4400, troponin 101, 106, continue cycle troponin -EKG is without acute ischemic changes - Continue aspirin, statin, beta-blocker - Last echo was done earlier this month and showed an ejection fraction of 40-45% with global  hypokinesis diastolic function could not be evaluated  Hyperlipidemia - Continue pravastatin      Advance Care Planning:   Code Status: DNR   Consults: Nephrology  Family  Communication: No family at bedside  Severity of Illness: The appropriate patient status for this patient is INPATIENT. Inpatient status is judged to be reasonable and necessary in order to provide the required intensity of service to ensure the patient's safety. The patient's presenting symptoms, physical exam findings, and initial radiographic and laboratory data in the context of their chronic comorbidities is felt to place them at high risk for further clinical deterioration. Furthermore, it is not anticipated that the patient will be medically stable for discharge from the hospital within 2 midnights of admission.   * I certify that at the point of admission it is my clinical judgment that the patient will require inpatient hospital care spanning beyond 2 midnights from the point of admission due to high intensity of service, high risk for further deterioration and high frequency of surveillance required.*  Author: Rolla Plate, DO 10/28/2021 2:12 AM  For on call review [email or web address].

## 2021-10-28 NOTE — Assessment & Plan Note (Signed)
-   Elevated to 101, 106 - Continue cycle troponin - Continue aspirin and statin as well as beta-blocker - No acute ischemic changes on EKG - Likely demand ischemia in the setting of hypoxia

## 2021-10-28 NOTE — Assessment & Plan Note (Signed)
-   resolved with treatments  - Multifactorial with pneumonia and respiratory failure with hypoxia and hypercapnia

## 2021-10-28 NOTE — Progress Notes (Signed)
Patient transported from ER to ICU on BIPAP.

## 2021-10-28 NOTE — Assessment & Plan Note (Signed)
-   Patient noted to be down to 60% oxygen saturation on baseline O2 3 L - Presented with nonrebreather - Related to COPD, fluid overload and Community-acquired pneumonia - Pt was treated with BiPAP - Patient has HD on Tuesday, Thursday, Saturday at baseline, consulted nephro for fluid management -Weaned off BiPAP and now on [place] supplemental oxygen - transfer to telemetry

## 2021-10-28 NOTE — Progress Notes (Signed)
Patient alert and oriented and breathing well at this time. BIPAP in room on standby if needed. O2 sat 97% on 3 lpm.

## 2021-10-28 NOTE — Progress Notes (Addendum)
[facility] NEPHROLOGY PROGRESS NOTE  Assessment/ Plan: Pt is a 81 y.o. yo female ESRD on HD who was readmitted with shortness of breath found to have pneumonia, following for ESRD management. OP HD orders:  [facility], TTS, 3 hours 15 minutes.  EDW 81 kg.  2K/2.5 calcium.  400/500.  Meds: Mircera 200 mcg every 2 weeks, Venofer 50 mg q, calcitriol 0.5 mcg 3 times a week, Sensipar 30 mg 3 times a week.  No heparin (history of GI bleed)  # SOB likely due to community-acquired pneumonia: Patient with eukocytosis and chest x-ray consistent with pneumonia.  She is currently on antibiotics and home oxygen around 3 to 4 L.  We will optimize volume with HD.  #ESRD: TTS.  Patient reported that she had full treatment yesterday.  Chest x-ray with pneumonia and currently hypotensive.  We will plan for regular HD tomorrow.  AV fistula for the access.  #Hypotension/volume: She does have some peripheral edema and blood pressure is running low.  I will increase midodrine to 15 mg 3 times daily.  Try UF with HD tomorrow.  #Chronic hypoxic respiratory failure: On 3 to 4 L of oxygen.  Management as above.  # Anemia of CKD: Hemoglobin above goal.  No need for ESA.  # Secondary hyperparathyroidism: Resume Sensipar, calcitriol and Renvela.  Monitor lab.  Discussed with the nursing staffs. D/w primary team and recommend palliative care consult.  Subjective: Seen and examined at bedside.  Patient was recently discharged from the hospital on 9/11 and now readmitted with shortness of breath.  Reportedly she had dialysis yesterday without any event.  Currently she is on 3 to 4 L of oxygen. Objective Vital signs in last 24 hours: Vitals:   10/28/21 0055 10/28/21 0210 10/28/21 0459 10/28/21 0726  BP: 95/68  (!) 94/47   Pulse: 96  89   Resp: 18  17   Temp:  98.2 F (36.8 C)    TempSrc:  Axillary    SpO2: 93%   98%  Weight:      Height:       Weight change:  No intake or output data in the 24  hours ending 10/28/21 0947     Labs: RENAL PANEL Recent Labs    03/06/21 0556 03/07/21 0425 03/17/21 1616 04/07/21 0222 04/08/21 0714 06/03/21 1853 06/04/21 0508 06/04/21 0905 06/06/21 0403 06/07/21 1221 07/02/21 1213 07/25/21 1725 07/28/21 0555 07/29/21 0435 07/30/21 0930 08/10/21 0347 08/12/21 0059 09/06/21 1453 09/06/21 1643 09/07/21 0809 09/08/21 0206 10/20/21 1441 10/20/21 2040 10/21/21 0446 10/22/21 1228 10/24/21 0417 10/27/21 2054 10/28/21 0502  NA 134* 131*   < > 132*   < > 133* 133*   < > 136 133*   < >  --  132* 133* 131* 134*   < > 136  --  134* 134* 133* 135 136 133* 134* 134* 135  K 3.7 4.2   < > 3.8   < > 4.0 3.7   < > 3.4* 3.9   < >  --  4.4 4.7 3.5 4.5   < > 4.3  --  4.3 4.0 3.1* 3.4* 3.6 4.5 4.1 4.1 4.7  CL 95* 92*   < > 97*   < > 94* 96*   < > 96* 95*   < >  --  97* 96* 94* 98   < > 100  --  100 99 96* 96* 97* 97* 94* 93* 96*  CO2 27 25   < >  27   < > 26 24   < > 27 27   < >  --  '25 24 27 22   '$ < > 25  --  '25 27 27 28 27 28 27 22 24  '$ GLUCOSE 106* 93   < > 96   < > 248* 187*   < > 122* 167*   < >  --  170* 180* 190* 166*   < > 127*  --  171* 109* 134* 115* 108* 188* 193* 176* 160*  BUN 28* 31*   < > 26*   < > 50* 56*   < > 46* 65*   < >  --  54* 58* 51* 68*   < > 45*  --  29* 29* 28* 22 26* 43* 54* 43* 48*  CREATININE 4.52* 5.48*   < > 4.25*   < > 3.88* 4.05*   < > 3.43* 4.39*   < >  --  4.08* 4.06* 3.54* 5.26*   < > 4.92*  --  3.32* 3.42* 2.66* 2.23* 2.61* 3.57* 3.33* 2.76* 2.91*  CALCIUM 8.7* 8.9   < > 9.4   < > 9.7 10.1   < > 9.7 9.8   < >  --  10.1 9.8 9.2 9.2   < > 9.6  --  9.1 9.5 9.2 9.2 9.6 10.4* 10.4* 9.7 9.5  MG 2.1 2.2  --  2.2  --  2.5* 2.4  --  2.6*  --   --  3.0*  --   --   --   --   --  2.5*  --   --   --  2.4  --   --   --   --   --  2.7*  PHOS 4.9* 6.1*  --   --    < > 4.3 2.6  --   --  4.8*  --   --  4.5 4.3 4.1  --   --   --  5.6* 3.4  --   --  3.1  --  5.9* 6.0*  --   --   ALBUMIN  --   --    < >  --    < > 4.1 3.7   < > 3.8 3.8  --    --  4.0 3.7 3.5 2.7*  --  3.6  --  3.3*  --  3.7 4.0  --  3.9 4.2 4.1 3.9   < > = values in this interval not displayed.     Liver Function Tests: Recent Labs  Lab 10/24/21 0417 10/27/21 2054 10/28/21 0502  AST  --  29 133*  ALT  --  32 163*  ALKPHOS  --  109 111  BILITOT  --  1.3* 1.1  PROT  --  7.3 7.0  ALBUMIN 4.2 4.1 3.9   No results for input(s): "LIPASE", "AMYLASE" in the last 168 hours. No results for input(s): "AMMONIA" in the last 168 hours. CBC: Recent Labs    04/07/21 0222 04/08/21 0714 06/02/21 0415 06/03/21 1853 10/20/21 1441 10/20/21 2040 10/22/21 1228 10/27/21 2054 10/28/21 0502  HGB 7.7*   < > 8.0*   < > 9.9* 10.1* 10.0* 12.9 12.4  MCV 108.9*   < > 112.6*   < > 110.6* 109.8* 109.1* 108.5* 110.1*  VITAMINB12 358  --  1,435*  --   --   --   --   --   --   FOLATE  --   --  7.0  --   --   --   --   --   --    < > = values in this interval not displayed.    Cardiac Enzymes: No results for input(s): "CKTOTAL", "CKMB", "CKMBINDEX", "TROPONINI" in the last 168 hours. CBG: Recent Labs  Lab 10/22/21 1701 10/22/21 2118 10/23/21 0501 10/23/21 1819 10/28/21 0746  GLUCAP 159* 152* 165* 224* 155*    Iron Studies: No results for input(s): "IRON", "TIBC", "TRANSFERRIN", "FERRITIN" in the last 72 hours. Studies/Results: DG Chest Port 1 View  Result Date: 10/27/2021 CLINICAL DATA:  Shortness of breath EXAM: PORTABLE CHEST 1 VIEW COMPARISON:  Radiographs 10/23/2021 FINDINGS: Stable cardiomegaly. Right basilar atelectasis/consolidation. Interstitial markings are prominent bilaterally likely due to low lung volumes and chronic interstitial lung disease. Question trace pleural effusions bilaterally are pleural thickening. No pneumothorax. Elevated right hemidiaphragm. No acute osseous abnormality. Aortic atherosclerotic calcification. IMPRESSION: Elevated right hemidiaphragm with associated presumed atelectasis though pneumonia is difficult to exclude. Hypoinflation and  chronic interstitial prominence. Bilateral trace pleural effusions or pleural thickening. Cardiomegaly. Electronically Signed   By: Placido Sou M.D.   On: 10/27/2021 19:15    Medications: Infusions:  azithromycin Stopped (10/28/21 0004)   cefTRIAXone (ROCEPHIN)  IV      Scheduled Medications:  apixaban  2.5 mg Oral BID   Chlorhexidine Gluconate Cloth  6 each Topical Daily   feeding supplement (NEPRO CARB STEADY)  237 mL Oral BID BM   ferrous sulfate  325 mg Oral BID WC   folic acid  1 mg Oral Daily   gabapentin  300 mg Oral Daily   ipratropium-albuterol  3 mL Nebulization Q6H   metoprolol succinate  25 mg Oral Daily   midodrine  10 mg Oral TID WC   nystatin  5 mL Oral QID   pravastatin  40 mg Oral q1800   sevelamer carbonate  800 mg Oral TID WC    have reviewed scheduled and prn medications.  Physical Exam: General:NAD, comfortable Heart:RRR, s1s2 nl Lungs: Poor air entry, no wheezing or increased work of breathing. Abdomen:soft, Non-tender, non-distended Extremities: Bilateral leg pitting edema presents. Dialysis Access: Right upper extremity AV fistula has good thrill and bruit.  Saed Hudlow Prasad Gem Conkle 10/28/2021,9:47 AM  LOS: 1 day

## 2021-10-28 NOTE — Progress Notes (Signed)
Removed from Bipap and placed on 4lpm [place]. SPO2 at this time is 97%

## 2021-10-28 NOTE — Assessment & Plan Note (Signed)
-   Chest x-ray shows pneumonia difficult to exclude, leukocytosis of 20.8, respiratory failure - Procalcitonin 8.46 - Rocephin and Zithromax started in the ED - Initially treated with Rocephin and Zithromax, transitioned to oral doxycycline 9/18 - Sputum culture, urine antigens for Legionella and strep - Unfortunately blood cultures were not done before antibiotics, patient states of fever we will do blood cultures at that time - COVID and flu negative - Continue to monitor

## 2021-10-28 NOTE — Assessment & Plan Note (Signed)
-   pH 7.1, PCO2 60 - Continue albuterol as needed - Scheduled DuoNebs - Continue BiPAP - Recently finished a course of 50 mg prednisone daily for 3 days - No wheezing - Continue to monitor

## 2021-10-28 NOTE — Progress Notes (Signed)
ASSUMPTION OF CARE NOTE   10/28/2021 2:50 PM  Jody [name] was seen and examined.  The H&P by the admitting provider, orders, imaging was reviewed.  Please see new orders.  Will continue to follow.  I had a discussion with nephrologist and we both agree that patient needs a palliative medicine consultation.  Pt was only able to be out of hospital 4 days before being readmitted, her low BPs make hemodialysis treatments very difficult.    Vitals:   10/28/21 1200 10/28/21 1300  BP:  108/61  Pulse: 69 83  Resp: 15 18  Temp:    SpO2: 100%     Results for orders placed or performed during the hospital encounter of 10/27/21  Resp Panel by RT-PCR (Flu A&B, Covid) Anterior Nasal Swab   Specimen: Anterior Nasal Swab  Result Value Ref Range   SARS Coronavirus 2 by RT PCR NEGATIVE NEGATIVE   Influenza A by PCR NEGATIVE NEGATIVE   Influenza B by PCR NEGATIVE NEGATIVE  Comprehensive metabolic panel  Result Value Ref Range   Sodium 134 (L) 135 - 145 mmol/L   Potassium 4.1 3.5 - 5.1 mmol/L   Chloride 93 (L) 98 - 111 mmol/L   CO2 22 22 - 32 mmol/L   Glucose, Bld 176 (H) 70 - 99 mg/dL   BUN 43 (H) 8 - 23 mg/dL   Creatinine, Ser 2.76 (H) 0.44 - 1.00 mg/dL   Calcium 9.7 8.9 - 10.3 mg/dL   Total Protein 7.3 6.5 - 8.1 g/dL   Albumin 4.1 3.5 - 5.0 g/dL   AST 29 15 - 41 U/L   ALT 32 0 - 44 U/L   Alkaline Phosphatase 109 38 - 126 U/L   Total Bilirubin 1.3 (H) 0.3 - 1.2 mg/dL   GFR, Estimated 17 (L) >60 mL/min   Anion gap 19 (H) 5 - 15  CBC  Result Value Ref Range   WBC 20.8 (H) 4.0 - 10.5 K/uL   RBC 3.98 3.87 - 5.11 MIL/uL   Hemoglobin 12.9 12.0 - 15.0 g/dL   HCT 43.2 36.0 - 46.0 %   MCV 108.5 (H) 80.0 - 100.0 fL   MCH 32.4 26.0 - 34.0 pg   MCHC 29.9 (L) 30.0 - 36.0 g/dL   RDW 17.5 (H) 11.5 - 15.5 %   Platelets 107 (L) 150 - 400 K/uL   nRBC 0.2 0.0 - 0.2 %  Brain natriuretic peptide  Result Value Ref Range   B Natriuretic Peptide 4,399.0 (H) 0.0 - 100.0 pg/mL  Blood gas, venous   Result Value Ref Range   pH, Ven 7.3 7.25 - 7.43   pCO2, Ven 66 (H) 44 - 60 mmHg   pO2, Ven 31 (LL) 32 - 45 mmHg   Bicarbonate 32.7 (H) 20.0 - 28.0 mmol/L   Acid-Base Excess 4.1 (H) 0.0 - 2.0 mmol/L   O2 Saturation 39.4 %   Patient temperature 36.4    Collection site BLOOD RIGHT HAND    Drawn by [id number]   Procalcitonin - Baseline  Result Value Ref Range   Procalcitonin 8.46 ng/mL  Comprehensive metabolic panel  Result Value Ref Range   Sodium 135 135 - 145 mmol/L   Potassium 4.7 3.5 - 5.1 mmol/L   Chloride 96 (L) 98 - 111 mmol/L   CO2 24 22 - 32 mmol/L   Glucose, Bld 160 (H) 70 - 99 mg/dL   BUN 48 (H) 8 - 23 mg/dL   Creatinine, Ser 2.91 (H) 0.44 - 1.00 mg/dL  Calcium 9.5 8.9 - 10.3 mg/dL   Total Protein 7.0 6.5 - 8.1 g/dL   Albumin 3.9 3.5 - 5.0 g/dL   AST 133 (H) 15 - 41 U/L   ALT 163 (H) 0 - 44 U/L   Alkaline Phosphatase 111 38 - 126 U/L   Total Bilirubin 1.1 0.3 - 1.2 mg/dL   GFR, Estimated 16 (L) >60 mL/min   Anion gap 15 5 - 15  Magnesium  Result Value Ref Range   Magnesium 2.7 (H) 1.7 - 2.4 mg/dL  CBC with Differential/Platelet  Result Value Ref Range   WBC 18.7 (H) 4.0 - 10.5 K/uL   RBC 3.85 (L) 3.87 - 5.11 MIL/uL   Hemoglobin 12.4 12.0 - 15.0 g/dL   HCT 42.4 36.0 - 46.0 %   MCV 110.1 (H) 80.0 - 100.0 fL   MCH 32.2 26.0 - 34.0 pg   MCHC 29.2 (L) 30.0 - 36.0 g/dL   RDW 17.9 (H) 11.5 - 15.5 %   Platelets 143 (L) 150 - 400 K/uL   nRBC 0.5 (H) 0.0 - 0.2 %   Neutrophils Relative % 89 %   Neutro Abs 16.7 (H) 1.7 - 7.7 K/uL   Lymphocytes Relative 3 %   Lymphs Abs 0.6 (L) 0.7 - 4.0 K/uL   Monocytes Relative 7 %   Monocytes Absolute 1.3 (H) 0.1 - 1.0 K/uL   Eosinophils Relative 0 %   Eosinophils Absolute 0.0 0.0 - 0.5 K/uL   Basophils Relative 0 %   Basophils Absolute 0.0 0.0 - 0.1 K/uL   WBC Morphology MORPHOLOGY UNREMARKABLE    RBC Morphology See Note    Smear Review MORPHOLOGY UNREMARKABLE    Immature Granulocytes 1 %   Abs Immature Granulocytes 0.11 (H)  0.00 - 0.07 K/uL   Burr Cells PRESENT    Polychromasia MARKED   Blood gas, arterial  Result Value Ref Range   FIO2 100.0 %   pH, Arterial 7.16 (LL) 7.35 - 7.45   pCO2 arterial 60 (H) 32 - 48 mmHg   pO2, Arterial 58 (L) 83 - 108 mmHg   Bicarbonate 21.2 20.0 - 28.0 mmol/L   Acid-base deficit 8.3 (H) 0.0 - 2.0 mmol/L   O2 Saturation 82.8 %   Patient temperature 36.4    Collection site LEFT BRACHIAL    Drawn by [id number]    Allens test (pass/fail) NOT INDICATED (A) PASS  Blood gas, arterial  Result Value Ref Range   FIO2 100 %   pH, Arterial 7.24 (L) 7.35 - 7.45   pCO2 arterial 64 (H) 32 - 48 mmHg   pO2, Arterial 222 (H) 83 - 108 mmHg   Bicarbonate 27.4 20.0 - 28.0 mmol/L   Acid-base deficit 1.4 0.0 - 2.0 mmol/L   O2 Saturation 99.8 %   Patient temperature 37.0    Collection site LEFT BRACHIAL    Drawn by [id number]    Allens test (pass/fail) NOT APPLICABLE (A) PASS  Glucose, capillary  Result Value Ref Range   Glucose-Capillary 155 (H) 70 - 99 mg/dL  Troponin I (High Sensitivity)  Result Value Ref Range   Troponin I (High Sensitivity) 101 (HH) <18 ng/L  Troponin I (High Sensitivity)  Result Value Ref Range   Troponin I (High Sensitivity) 106 (HH) <18 ng/L  Troponin I (High Sensitivity)  Result Value Ref Range   Troponin I (High Sensitivity) 136 (HH) <18 ng/L  Troponin I (High Sensitivity)  Result Value Ref Range   Troponin I (High Sensitivity) 146 (HH) <  18 ng/L   C. Wynetta Emery, MD [facility]   10/27/2021  5:50 PM How to contact the [facility] Attending or Consulting provider [place] or covering provider during after hours [place], for this patient?  Check the care team in [facility] and look for a) attending/consulting TRH provider listed and b) the [facility] team listed Log into [email or web address] and use [place]'s universal password to access. If you do not have the password, please contact the hospital operator. Locate the [facility] provider you are looking for under [facility] and page to  a number that you can be directly reached. If you still have difficulty reaching the provider, please page the [facility] (Director on Call) for the Hospitalists listed on amion for assistance.

## 2021-10-28 NOTE — Assessment & Plan Note (Signed)
-   Cardiorenal syndrome, fluid overload, HD patient - BNP 4400, troponin 101, 106, continue cycle troponin -EKG is without acute ischemic changes - Continue aspirin, statin, beta-blocker - Last echo was done earlier this month and showed an ejection fraction of 40-45% with global hypokinesis diastolic function could not be evaluated

## 2021-10-28 NOTE — TOC Initial Note (Signed)
Transition of Care [facility]) - Initial/Assessment Note    Patient Details  [name] MRN: [id number] Date of Birth: Nov 08, 1940  Transition of Care [facility] CM/SW Contact:    Shade Flood, LCSW Phone Number: 10/28/2021, 3:33 PM  Clinical Narrative:                  Pt admitted from [facility]F where she is in LTC. Met with pt and family at bedside to review dc planning. Pt/family indicate plan is for return to [facility] at dc. If pt recommended for rehab, TOC will assist with insurance authorization. Anticipating dc in 2-3 days. Updated Marita [place] at [facility] TOC will follow.  Expected Discharge Plan: Skilled Nursing Facility Barriers to Discharge: Continued Medical Work up   Patient Goals and CMS Choice Patient states their goals for this hospitalization and ongoing recovery are:: return to facility CMS Medicare.gov Compare Post Acute Care list provided to:: Patient Choice offered to / list presented to : Patient  Expected Discharge Plan and Services Expected Discharge Plan: [place] In-house Referral: Clinical Social Work   Post Acute Care Choice: Resumption of Svcs/PTA Provider Living arrangements for the past 2 months: [place]                                      Prior Living Arrangements/Services Living arrangements for the past 2 months: [place] Lives with:: Facility Resident Patient language and need for interpreter reviewed:: Yes Do you feel safe going back to the place where you live?: Yes      Need for Family Participation in Patient Care: No (Comment) Care giver support system in place?: Yes (comment)   Criminal Activity/Legal Involvement Pertinent to Current Situation/Hospitalization: No - Comment as needed  Activities of Daily Living Home Assistive Devices/Equipment: [employer] fast streamer ADL Screening (condition at time of admission) Patient's cognitive ability adequate to safely complete daily activities?: Yes Is the  patient deaf or have difficulty hearing?: No Does the patient have difficulty seeing, even when wearing glasses/contacts?: No Does the patient have difficulty concentrating, remembering, or making decisions?: No Patient able to express need for assistance with ADLs?: Yes Does the patient have difficulty dressing or bathing?: Yes Independently performs ADLs?: No Communication: Independent Dressing (OT): Dependent Is this a change from baseline?: Pre-admission baseline Grooming: Needs assistance, Dependent Is this a change from baseline?: Pre-admission baseline Feeding: Independent Bathing: Dependent, Needs assistance Is this a change from baseline?: Pre-admission baseline Toileting: Dependent Is this a change from baseline?: Pre-admission baseline In/Out Bed: Dependent Is this a change from baseline?: Pre-admission baseline Walks in Home: Dependent Is this a change from baseline?: Pre-admission baseline Does the patient have difficulty walking or climbing stairs?: Yes Weakness of Legs: Both Weakness of Arms/Hands: Both  Permission Sought/Granted                  Emotional Assessment Appearance:: Appears stated age Attitude/Demeanor/Rapport: Engaged Affect (typically observed): Pleasant Orientation: : Oriented to Self, Oriented to Place, Oriented to Situation, Oriented to  Time Alcohol / Substance Use: Not Applicable Psych Involvement: No (comment)  Admission diagnosis:  Acute respiratory failure with hypoxia ([place]) [J96.01] Patient Active Problem List   Diagnosis Date Noted   CAP (community acquired pneumonia) 10/28/2021   Elevated troponin 10/28/2021   Acute respiratory failure with hypoxia ([place]) 10/27/2021   Goals of care, counseling/discussion 10/23/2021   Renal failure 09/07/2021  Hypotension 08/10/2021   Class 1 obesity 07/26/2021   Chest pain 07/02/2021   Pericardial friction rub 06/03/2021   Acute on chronic respiratory failure with hypoxia and hypercapnia  (HCC) 06/01/2021   Persistent atrial fibrillation ([place]) 06/01/2021   Thrombocytopenia ([place]) 05/31/2021   Acute hematogenous osteomyelitis of right foot ([place]) 05/23/2021   PAD (peripheral artery disease) ([place]) 05/23/2021   Leg pain 44/97/5300   Acute metabolic encephalopathy 51/11/2109   Sinus bradycardia 04/07/2021   Hypokalemia 04/05/2021   History of CVA (cerebrovascular accident) 04/04/2021   Acute osteomyelitis of left foot ([place]) 04/04/2021   Anemia of chronic kidney failure 04/04/2021   Hyponatremia 04/04/2021   Memory loss 04/04/2021   Pressure injury of skin 03/04/2021   Rectal bleeding 12/27/2019   NSVT (nonsustained ventricular tachycardia) ([place]) 10/27/2019   Megaloblastic anemia 10/26/2019   Chronic blood loss anemia 10/26/2017   Iron deficiency anemia 02/23/2017   Acute on chronic combined systolic and diastolic CHF (congestive heart failure) ([place]) 10/15/2016   ESRD on dialysis ([place]) 10/15/2016   NSTEMI (non-ST elevated myocardial infarction) ([place]) 10/03/2015   COPD (chronic obstructive pulmonary disease) ([place])    Anemia in chronic kidney disease, Stage IV 06/05/2015   CHF exacerbation ([place]) 06/05/2015   TIA (transient ischemic attack) 09/02/2014   Gait difficulty 09/02/2014   Other specified transient cerebral ischemias    Stroke with cerebral ischemia ([place])    Hemispheric carotid artery syndrome    Essential hypertension    Hyperlipidemia    PCP:  Monico Blitz, MD Pharmacy:   [facility], [place] [address] Phone: [phone] Fax: [phone]     Social Determinants of Health (SDOH) Interventions    Readmission Risk Interventions    08/15/2021   11:49 AM 06/07/2021    2:47 PM 06/01/2021   10:59 AM  Readmission Risk Prevention Plan  Transportation Screening Complete Complete Complete  Medication Review (RN Care Manager)  Complete Complete  PCP or Specialist appointment within 3-5 days of  discharge  Complete   HRI or Home Care Consult Complete Complete Complete  SW Recovery Care/Counseling Consult Complete Complete Complete  Palliative Care Screening Not Applicable Complete Not Applicable  Skilled Nursing Facility Complete Complete Complete

## 2021-10-28 NOTE — Assessment & Plan Note (Signed)
-   Tuesday Thursday Saturday dialysis - Patient is clinically fluid overloaded - BNP is 4400 - Consult nephro for fluid management - Continue Renvela - Continue renal diet with fluid restrictions - Continue to monitor; nephrology planning for HD today 9/16

## 2021-10-28 NOTE — Progress Notes (Signed)
Date and time results received: 10/28/21 0728 (use smartphrase ".now" to insert current time)  Test: Troponin  Critical Value: 146  Name of Provider Notified: Dr Wynetta Emery  Orders Received? Or Actions Taken?:  No new orders currently

## 2021-10-28 NOTE — Assessment & Plan Note (Signed)
Continue pravastatin 

## 2021-10-28 NOTE — Assessment & Plan Note (Signed)
-   Continue metoprolol - Continue Eliquis - Continue to monitor

## 2021-10-28 NOTE — [place] FL2 (Signed)
[place] MEDICAID FL2 LEVEL OF CARE SCREENING TOOL     IDENTIFICATION  Patient [name] Birthdate: 1940/02/28 Sex: female Admission Date (Current Location): 10/27/2021  [facility] and [place] Number:  [employer] and Address:  [place] [address], [place]      Provider Number: [id number]  Attending Physician Name and Address:  Murlean Iba, MD  Relative Name and Phone Number:       Current Level of Care: Hospital Recommended Level of Care: [place] Prior Approval Number:    Date Approved/Denied:   PASRR Number:    Discharge Plan: SNF    Current Diagnoses: Patient Active Problem List   Diagnosis Date Noted   CAP (community acquired pneumonia) 10/28/2021   Elevated troponin 10/28/2021   Acute respiratory failure with hypoxia ([place]) 10/27/2021   Goals of care, counseling/discussion 10/23/2021   Renal failure 09/07/2021   Hypotension 08/10/2021   Class 1 obesity 07/26/2021   Chest pain 07/02/2021   Pericardial friction rub 06/03/2021   Acute on chronic respiratory failure with hypoxia and hypercapnia ([place]) 06/01/2021   Persistent atrial fibrillation ([place]) 06/01/2021   Thrombocytopenia ([place]) 05/31/2021   Acute hematogenous osteomyelitis of right foot ([place]) 05/23/2021   PAD (peripheral artery disease) ([place]) 05/23/2021   Leg pain 99/37/1696   Acute metabolic encephalopathy 78/93/8101   Sinus bradycardia 04/07/2021   Hypokalemia 04/05/2021   History of CVA (cerebrovascular accident) 04/04/2021   Acute osteomyelitis of left foot ([place]) 04/04/2021   Anemia of chronic kidney failure 04/04/2021   Hyponatremia 04/04/2021   Memory loss 04/04/2021   Pressure injury of skin 03/04/2021   Rectal bleeding 12/27/2019   NSVT (nonsustained ventricular tachycardia) ([place]) 10/27/2019   Megaloblastic anemia 10/26/2019   Chronic blood loss anemia 10/26/2017   Iron deficiency anemia 02/23/2017   Acute on  chronic combined systolic and diastolic CHF (congestive heart failure) ([place]) 10/15/2016   ESRD on dialysis ([place]) 10/15/2016   NSTEMI (non-ST elevated myocardial infarction) ([place]) 10/03/2015   COPD (chronic obstructive pulmonary disease) ([place])    Anemia in chronic kidney disease, Stage IV 06/05/2015   CHF exacerbation ([place]) 06/05/2015   TIA (transient ischemic attack) 09/02/2014   Gait difficulty 09/02/2014   Other specified transient cerebral ischemias    Stroke with cerebral ischemia (HCC)    Hemispheric carotid artery syndrome    Essential hypertension    Hyperlipidemia     Orientation RESPIRATION BLADDER Height & Weight     Self, Situation, Place  O2 (see dc summary) Continent Weight: 175 lb 4.3 oz (79.5 kg) Height:  '5\' 7"'$  (170.2 cm)  BEHAVIORAL SYMPTOMS/MOOD NEUROLOGICAL BOWEL NUTRITION STATUS      Continent Diet (see dc summary)  AMBULATORY STATUS COMMUNICATION OF NEEDS Skin   Extensive Assist Verbally Normal                       Personal Care Assistance Level of Assistance    Bathing Assistance: Limited assistance Feeding assistance: Independent Dressing Assistance: Limited assistance     Functional Limitations Info    Sight Info: Adequate Hearing Info: Adequate Speech Info: Adequate    SPECIAL CARE FACTORS FREQUENCY                       Contractures Contractures Info: Not present    Additional Factors Info    Code Status Info: DNR Allergies Info: Codeine  Current Medications (10/28/2021):  This is the current hospital active medication list Current Facility-Administered Medications  Medication Dose Route Frequency Provider Last Rate Last Admin   acetaminophen (TYLENOL) tablet 650 mg  650 mg Oral Q6H PRN Zierle-Ghosh, Asia B, DO       Or   acetaminophen (TYLENOL) suppository 650 mg  650 mg Rectal Q6H PRN Zierle-Ghosh, Asia B, DO       albuterol (PROVENTIL) (2.5 MG/3ML) 0.083% nebulizer solution 2.5 mg  2.5 mg Nebulization Q6H PRN  Zierle-Ghosh, Asia B, DO       apixaban (ELIQUIS) tablet 2.5 mg  2.5 mg Oral BID Zierle-Ghosh, Asia B, DO   2.5 mg at 10/28/21 0848   azithromycin (ZITHROMAX) 500 mg in sodium chloride 0.9 % 250 mL IVPB  500 mg Intravenous Q24H Zierle-Ghosh, Asia B, DO   Stopped at 10/28/21 0004   [START ON 10/29/2021] calcitRIOL (ROCALTROL) capsule 0.5 mcg  0.5 mcg Oral Q T,Th,Sat-1800 Rosita Fire, MD       cefTRIAXone (ROCEPHIN) 1 g in sodium chloride 0.9 % 100 mL IVPB  1 g Intravenous Q24H Zierle-Ghosh, Asia B, DO       Chlorhexidine Gluconate Cloth 2 % PADS 6 each  6 each Topical Daily Johnson, Clanford L, MD   6 each at 10/28/21 0848   Chlorhexidine Gluconate Cloth 2 % PADS 6 each  6 each Topical Q0600 Rosita Fire, MD       [START ON 10/29/2021] cinacalcet (SENSIPAR) tablet 30 mg  30 mg Oral Q T,Th,Sat-1800 Rosita Fire, MD       feeding supplement (NEPRO CARB STEADY) liquid 237 mL  237 mL Oral BID BM Zierle-Ghosh, Asia B, DO   237 mL at 10/28/21 1351   fentaNYL (SUBLIMAZE) injection 12.5 mcg  12.5 mcg Intravenous Q2H PRN Zierle-Ghosh, Asia B, DO       folic acid (FOLVITE) tablet 1 mg  1 mg Oral Daily Zierle-Ghosh, Asia B, DO   1 mg at 10/28/21 0848   gabapentin (NEURONTIN) capsule 300 mg  300 mg Oral Daily Zierle-Ghosh, Asia B, DO       ipratropium-albuterol (DUONEB) 0.5-2.5 (3) MG/3ML nebulizer solution 3 mL  3 mL Nebulization Q6H Zierle-Ghosh, Asia B, DO   3 mL at 10/28/21 1526   [START ON 10/29/2021] metoprolol succinate (TOPROL-XL) 24 hr tablet 12.5 mg  12.5 mg Oral Daily Johnson, Clanford L, MD       midodrine (PROAMATINE) tablet 15 mg  15 mg Oral TID WC Rosita Fire, MD   15 mg at 10/28/21 1222   nystatin (MYCOSTATIN) 100000 UNIT/ML suspension 500,000 Units  5 mL Oral QID Zierle-Ghosh, Asia B, DO   500,000 Units at 10/28/21 1354   ondansetron (ZOFRAN) tablet 4 mg  4 mg Oral Q6H PRN Zierle-Ghosh, Asia B, DO       Or   ondansetron (ZOFRAN) injection 4 mg  4 mg  Intravenous Q6H PRN Zierle-Ghosh, Asia B, DO       oxyCODONE (Oxy IR/ROXICODONE) immediate release tablet 5 mg  5 mg Oral Q8H PRN Zierle-Ghosh, Asia B, DO       pravastatin (PRAVACHOL) tablet 40 mg  40 mg Oral q1800 Zierle-Ghosh, Asia B, DO       sevelamer carbonate (RENVELA) tablet 800 mg  800 mg Oral TID WC Zierle-Ghosh, Asia B, DO   800 mg at 10/28/21 1222     Discharge Medications: Please see discharge summary for a list of discharge medications.  Relevant Imaging  Results:  Relevant Lab Results:   Additional Information    Shade Flood, LCSW

## 2021-10-29 ENCOUNTER — Encounter (HOSPITAL_COMMUNITY): Payer: Self-pay | Admitting: Family Medicine

## 2021-10-29 DIAGNOSIS — G9341 Metabolic encephalopathy: Secondary | ICD-10-CM | POA: Diagnosis not present

## 2021-10-29 DIAGNOSIS — J189 Pneumonia, unspecified organism: Secondary | ICD-10-CM | POA: Diagnosis not present

## 2021-10-29 DIAGNOSIS — J449 Chronic obstructive pulmonary disease, unspecified: Secondary | ICD-10-CM | POA: Diagnosis not present

## 2021-10-29 DIAGNOSIS — J9601 Acute respiratory failure with hypoxia: Secondary | ICD-10-CM | POA: Diagnosis not present

## 2021-10-29 LAB — RENAL FUNCTION PANEL
Albumin: 3.6 g/dL (ref 3.5–5.0)
Anion gap: 15 (ref 5–15)
BUN: 66 mg/dL — ABNORMAL HIGH (ref 8–23)
CO2: 23 mmol/L (ref 22–32)
Calcium: 9.5 mg/dL (ref 8.9–10.3)
Chloride: 95 mmol/L — ABNORMAL LOW (ref 98–111)
Creatinine, Ser: 3.84 mg/dL — ABNORMAL HIGH (ref 0.44–1.00)
GFR, Estimated: 11 mL/min — ABNORMAL LOW (ref >60)
Glucose, Bld: 170 mg/dL — ABNORMAL HIGH (ref 70–99)
Phosphorus: 5.4 mg/dL — ABNORMAL HIGH (ref 2.5–4.6)
Potassium: 3.9 mmol/L (ref 3.5–5.1)
Sodium: 133 mmol/L — ABNORMAL LOW (ref 135–145)

## 2021-10-29 LAB — CBC
HCT: 37.3 % (ref 36.0–46.0)
Hemoglobin: 11.2 g/dL — ABNORMAL LOW (ref 12.0–15.0)
MCH: 31.5 pg (ref 26.0–34.0)
MCHC: 30 g/dL (ref 30.0–36.0)
MCV: 105.1 fL — ABNORMAL HIGH (ref 80.0–100.0)
Platelets: 181 10*3/uL (ref 150–400)
RBC: 3.55 MIL/uL — ABNORMAL LOW (ref 3.87–5.11)
RDW: 18.3 % — ABNORMAL HIGH (ref 11.5–15.5)
WBC: 10 10*3/uL (ref 4.0–10.5)
nRBC: 2.6 % — ABNORMAL HIGH (ref 0.0–0.2)

## 2021-10-29 LAB — CLOSTRIDIUM DIFFICILE BY PCR, REFLEXED: Toxigenic C. Difficile by PCR: NEGATIVE

## 2021-10-29 LAB — C DIFFICILE QUICK SCREEN W PCR REFLEX
C Diff antigen: POSITIVE — AB
C Diff toxin: NEGATIVE

## 2021-10-29 LAB — HEPATITIS B SURFACE ANTIBODY, QUANTITATIVE: Hep B S AB Quant (Post): 96.9 m[IU]/mL (ref >9.9)

## 2021-10-29 MED ORDER — ALBUMIN HUMAN 25 % IV SOLN
25.0000 g | Freq: Once | INTRAVENOUS | Status: AC
  Administered 2021-10-29: 25 g via INTRAVENOUS

## 2021-10-29 MED ORDER — IPRATROPIUM-ALBUTEROL 0.5-2.5 (3) MG/3ML IN SOLN
3.0000 mL | Freq: Two times a day (BID) | RESPIRATORY_TRACT | Status: DC
  Administered 2021-10-29 – 2021-11-03 (×11): 3 mL via RESPIRATORY_TRACT
  Filled 2021-10-29 (×12): qty 3

## 2021-10-29 NOTE — Progress Notes (Signed)
Received patient in bed to unit.  Alert and oriented.  Informed consent signed and in chart.   Treatment initiated: 1050 Treatment completed: 1420  Patient tolerated well.  Transported back to the room  Alert, without acute distress.  Hand-off given to patient's nurse.   Access used: AVF Access issues: none  Total UF removed: 2.2 L Medication(s) given: Albumin 25 g. Post HD VS: 100/56 P 92 R 16. Post HD weight: 79.3kg   [facility]

## 2021-10-29 NOTE — Progress Notes (Signed)
PROGRESS NOTE   Jody [name]  AST:[id number] DOB: [birth year] DOA: 10/27/2021 PCP: Monico Blitz, MD   Chief Complaint  Patient presents with   Shortness of Breath   Level of care: Telemetry  Brief Admission History:  81 y.o. female with medical history significant of ESRD, chronic atrial fibrillation, COPD, hyperlipidemia, hypertension, diet-controlled diabetes mellitus type 2, and more presents to the ED with a chief complaint of dyspnea.  Unfortunately patient is too altered at the time of my exam to give any history.  She was more alert on first presentation to the ER.  She had told the ER staff that she started to have shortness of breath when dialysis finished.  They checked her oxygen on her baseline 3 L nasal cannula and it was 60%.  She came into the ER on nonrebreather.  Work-up revealed a likely pneumonia and she was started on Rocephin and Zithromax.  Patient slowly became more somnolent while she was in the ER.  When I saw her she would not speak, but would look in the eye, and open her eyes to voice.  Patient is DNR.  BiPAP was started.  ABG showed a hypercarbia.  Will admit to stepdown on BiPAP.  Patient does get dialysis Tuesday, Thursday, Saturday.  She appears clinically fluid overloaded.  Patient is an uric so Lasix is not prescribed.  Patient does have an elevated but stable troponin that is likely demand ischemia in the setting of hypoxia.  Patient is not able to discuss any complaints at this time.   Patient has DNR form at bedside.   It is noted the patient was recently discharged 4 days PTA.  At that admission patient was also volume overloaded.  Her dialysis sessions have been cut short due to leg pain.  She did have a cough productive of white sputum at that time.  She was hypoxic as well with her oxygen sats down to 70s on her baseline 3 L nasal cannula.  Her troponins were lower at that time and 50, 24 during that hospitalization patient continued to have episodes of  shortness of breath on her nondialysis days.  This was thought to be multifactorial with COPD, anxiety, and hypervolemia all contributing.  Respiratory reports the patient was calling out every hour for breathing treatment, and they believed it to be anxiety related.  They do note that her somnolence now is a change from her baseline.   Assessment and Plan: * Acute respiratory failure with hypoxia ([place]) - Patient noted to be down to 60% oxygen saturation on baseline O2 3 L - Presented with nonrebreather - Related to COPD, fluid overload and Community-acquired pneumonia - Pt was treated with BiPAP - Patient has HD on Tuesday, Thursday, Saturday at baseline, consulted nephro for fluid management -Weaned off BiPAP and now on [place] supplemental oxygen - transfer to telemetry  Elevated troponin - Elevated to 101, 106 - Continue aspirin and statin as well as beta-blocker - No acute ischemic changes on EKG - Likely demand ischemia in the setting of hypoxia, do not suspect ACS  CAP (community acquired pneumonia) - Chest x-ray shows pneumonia difficult to exclude, leukocytosis of 20.8, respiratory failure - Procalcitonin 8.46 - Rocephin and Zithromax started in the ED - Continue Rocephin and Zithromax - Sputum culture, urine antigens for Legionella and strep - Unfortunately blood cultures were not done before antibiotics, patient states of fever we will do blood cultures at that time - COVID and flu negative - Continue to monitor  Persistent atrial fibrillation (HCC) - Continue metoprolol - Continue Eliquis - Continue to monitor  Acute metabolic encephalopathy - Improving with treatments  - Multifactorial with pneumonia and respiratory failure with hypoxia and hypercapnia  ESRD on dialysis [facility]) - Tuesday Thursday Saturday dialysis - Patient is clinically fluid overloaded - BNP is 4400 - Consult nephro for fluid management - Continue Renvela - Continue renal diet with fluid  restrictions - Continue to monitor; nephrology planning for HD today 9/16  COPD (chronic obstructive pulmonary disease) (HCC) - Continue albuterol as needed - Scheduled DuoNebs - Continue BiPAP - Recently finished a course of 50 mg prednisone - No wheezing - Continue to monitor  CHF exacerbation (HCC) - Cardiorenal syndrome, fluid overload, HD patient - BNP 4400, troponin 101, 106, continue cycle troponin -EKG is without acute ischemic changes - Continue aspirin, statin, beta-blocker - Last echo was done earlier this month and showed an ejection fraction of 40-45% with global hypokinesis diastolic function could not be evaluated  Hyperlipidemia - Continue pravastatin  DVT prophylaxis: apixaban Code Status: DNR  Family Communication:  Disposition: Status is: Inpatient Remains inpatient appropriate because: IV antibiotics   Consultants:  Nephrology  Palliative care  Procedures:  Hemodialysis 9/16 Antimicrobials:  Ceftriaxone/ azithromycin 9/15>>  Subjective: Pt more alert and eating and drinking better today.  No complaints.   Objective: Vitals:   10/29/21 0233 10/29/21 0400 10/29/21 0725 10/29/21 0800  BP:  (!) 106/55    Pulse:  (!) 59    Resp:  18    Temp:   97.8 F (36.6 C)   TempSrc:   Axillary   SpO2: 100% 100%  91%  Weight:      Height:        Intake/Output Summary (Last 24 hours) at 10/29/2021 1047 Last data filed at 10/29/2021 6222 Gross per 24 hour  Intake 597.09 ml  Output --  Net 597.09 ml   Filed Weights   10/27/21 1828  Weight: 79.5 kg   Examination:  General exam: frail, elderly, chronically ill appearing female, awake, alert, cooperative, Appears calm and comfortable  Respiratory system: rales and crackles heard. Respiratory effort normal. Cardiovascular system: normal S1 & S2 heard. No JVD, murmurs, rubs, gallops or clicks. No pedal edema. Gastrointestinal system: Abdomen is nondistended, soft and nontender. No organomegaly or masses felt.  Normal bowel sounds heard. Central nervous system: Alert and oriented. No focal neurological deficits. Extremities: Symmetric 5 x 5 power. Skin: No rashes, lesions or ulcers. Psychiatry: Judgement and insight appear normal. Mood & affect appropriate.   Data Reviewed: I have personally reviewed following labs and imaging studies  CBC: Recent Labs  Lab 10/22/21 1228 10/27/21 2054 10/28/21 0502  WBC 5.4 20.8* 18.7*  NEUTROABS  --   --  16.7*  HGB 10.0* 12.9 12.4  HCT 33.7* 43.2 42.4  MCV 109.1* 108.5* 110.1*  PLT 132* 107* 143*    Basic Metabolic Panel: Recent Labs  Lab 10/22/21 1228 10/24/21 0417 10/27/21 2054 10/28/21 0502  NA 133* 134* 134* 135  K 4.5 4.1 4.1 4.7  CL 97* 94* 93* 96*  CO2 '28 27 22 24  '$ GLUCOSE 188* 193* 176* 160*  BUN 43* 54* 43* 48*  CREATININE 3.57* 3.33* 2.76* 2.91*  CALCIUM 10.4* 10.4* 9.7 9.5  MG  --   --   --  2.7*  PHOS 5.9* 6.0*  --   --     CBG: Recent Labs  Lab 10/22/21 1701 10/22/21 2118 10/23/21 0501 10/23/21 1819 10/28/21 0746  GLUCAP 159* 152* 165* 224* 155*    Recent Results (from the past 240 hour(s))  Resp Panel by RT-PCR (Flu A&B, Covid) Anterior Nasal Swab     Status: None   Collection Time: 10/20/21  2:19 PM   Specimen: Anterior Nasal Swab  Result Value Ref Range Status   SARS Coronavirus 2 by RT PCR NEGATIVE NEGATIVE Final    Comment: (NOTE) SARS-CoV-2 target nucleic acids are NOT DETECTED.  The SARS-CoV-2 RNA is generally detectable in upper respiratory specimens during the acute phase of infection. The lowest concentration of SARS-CoV-2 viral copies this assay can detect is 138 copies/mL. A negative result does not preclude SARS-Cov-2 infection and should not be used as the sole basis for treatment or other patient management decisions. A negative result may occur with  improper specimen collection/handling, submission of specimen other than nasopharyngeal swab, presence of viral mutation(s) within the areas  targeted by this assay, and inadequate number of viral copies(<138 copies/mL). A negative result must be combined with clinical observations, patient history, and epidemiological information. The expected result is Negative.  Fact Sheet for Patients:  EntrepreneurPulse.com.au  Fact Sheet for Healthcare Providers:  IncredibleEmployment.be  This test is no t yet approved or cleared by the [place] FDA and  has been authorized for detection and/or diagnosis of SARS-CoV-2 by FDA under an Emergency Use Authorization (EUA). This EUA will remain  in effect (meaning this test can be used) for the duration of the COVID-19 declaration under Section 564(b)(1) of the Act, 21 U.S.C.section 360bbb-3(b)(1), unless the authorization is terminated  or revoked sooner.       Influenza A by PCR NEGATIVE NEGATIVE Final   Influenza B by PCR NEGATIVE NEGATIVE Final    Comment: (NOTE) The Xpert Xpress SARS-CoV-2/FLU/RSV plus assay is intended as an aid in the diagnosis of influenza from Nasopharyngeal swab specimens and should not be used as a sole basis for treatment. Nasal washings and aspirates are unacceptable for Xpert Xpress SARS-CoV-2/FLU/RSV testing.  Fact Sheet for Patients: EntrepreneurPulse.com.au  Fact Sheet for Healthcare Providers: IncredibleEmployment.be  This test is not yet approved or cleared by the [place] FDA and has been authorized for detection and/or diagnosis of SARS-CoV-2 by FDA under an Emergency Use Authorization (EUA). This EUA will remain in effect (meaning this test can be used) for the duration of the COVID-19 declaration under Section 564(b)(1) of the Act, 21 U.S.C. section 360bbb-3(b)(1), unless the authorization is terminated or revoked.  Performed at [facility], [address] [place], [place] [address]   MRSA Next Gen by PCR, Nasal     Status: None   Collection Time: 10/22/21   4:30 PM   Specimen: Nasal Mucosa; Nasal Swab  Result Value Ref Range Status   MRSA by PCR Next Gen NOT DETECTED NOT DETECTED Final    Comment: (NOTE) The GeneXpert MRSA Assay (FDA approved for NASAL specimens only), is one component of a comprehensive MRSA colonization surveillance program. It is not intended to diagnose MRSA infection nor to guide or monitor treatment for MRSA infections. Test performance is not FDA approved in patients less than 36 years old. Performed at [facility], [address] [place], [place] [address]   Resp Panel by RT-PCR (Flu A&B, Covid) Anterior Nasal Swab     Status: None   Collection Time: 10/27/21  6:39 PM   Specimen: Anterior Nasal Swab  Result Value Ref Range Status   SARS Coronavirus 2 by RT PCR NEGATIVE NEGATIVE Final  Comment: (NOTE) SARS-CoV-2 target nucleic acids are NOT DETECTED.  The SARS-CoV-2 RNA is generally detectable in upper respiratory specimens during the acute phase of infection. The lowest concentration of SARS-CoV-2 viral copies this assay can detect is 138 copies/mL. A negative result does not preclude SARS-Cov-2 infection and should not be used as the sole basis for treatment or other patient management decisions. A negative result may occur with  improper specimen collection/handling, submission of specimen other than nasopharyngeal swab, presence of viral mutation(s) within the areas targeted by this assay, and inadequate number of viral copies(<138 copies/mL). A negative result must be combined with clinical observations, patient history, and epidemiological information. The expected result is Negative.  Fact Sheet for Patients:  EntrepreneurPulse.com.au  Fact Sheet for Healthcare Providers:  IncredibleEmployment.be  This test is no t yet approved or cleared by the [place] FDA and  has been authorized for detection and/or diagnosis of SARS-CoV-2 by FDA under an Emergency Use  Authorization (EUA). This EUA will remain  in effect (meaning this test can be used) for the duration of the COVID-19 declaration under Section 564(b)(1) of the Act, 21 U.S.C.section 360bbb-3(b)(1), unless the authorization is terminated  or revoked sooner.       Influenza A by PCR NEGATIVE NEGATIVE Final   Influenza B by PCR NEGATIVE NEGATIVE Final    Comment: (NOTE) The Xpert Xpress SARS-CoV-2/FLU/RSV plus assay is intended as an aid in the diagnosis of influenza from Nasopharyngeal swab specimens and should not be used as a sole basis for treatment. Nasal washings and aspirates are unacceptable for Xpert Xpress SARS-CoV-2/FLU/RSV testing.  Fact Sheet for Patients: EntrepreneurPulse.com.au  Fact Sheet for Healthcare Providers: IncredibleEmployment.be  This test is not yet approved or cleared by the [place] FDA and has been authorized for detection and/or diagnosis of SARS-CoV-2 by FDA under an Emergency Use Authorization (EUA). This EUA will remain in effect (meaning this test can be used) for the duration of the COVID-19 declaration under Section 564(b)(1) of the Act, 21 U.S.C. section 360bbb-3(b)(1), unless the authorization is terminated or revoked.  Performed at [facility], [address] [place], [place] [address]      Radiology Studies: [facility] Chest [facility] 1 View  Result Date: 10/27/2021 CLINICAL DATA:  Shortness of breath EXAM: PORTABLE CHEST 1 VIEW COMPARISON:  Radiographs 10/23/2021 FINDINGS: Stable cardiomegaly. Right basilar atelectasis/consolidation. Interstitial markings are prominent bilaterally likely due to low lung volumes and chronic interstitial lung disease. Question trace pleural effusions bilaterally are pleural thickening. No pneumothorax. Elevated right hemidiaphragm. No acute osseous abnormality. Aortic atherosclerotic calcification. IMPRESSION: Elevated right hemidiaphragm with associated presumed atelectasis  though pneumonia is difficult to exclude. Hypoinflation and chronic interstitial prominence. Bilateral trace pleural effusions or pleural thickening. Cardiomegaly. Electronically Signed   By: Placido Sou M.D.   On: 10/27/2021 19:15    Scheduled Meds:  apixaban  2.5 mg Oral BID   calcitRIOL  0.5 mcg Oral Q T,Th,Sat-1800   Chlorhexidine Gluconate Cloth  6 each Topical Daily   Chlorhexidine Gluconate Cloth  6 each Topical Q0600   cinacalcet  30 mg Oral Q T,Th,Sat-1800   feeding supplement (NEPRO CARB STEADY)  237 mL Oral BID BM   folic acid  1 mg Oral Daily   gabapentin  300 mg Oral Daily   ipratropium-albuterol  3 mL Nebulization Q6H   metoprolol succinate  12.5 mg Oral Daily   midodrine  15 mg Oral TID WC   nystatin  5 mL Oral QID   pravastatin  40 mg Oral q1800   sevelamer carbonate  800 mg Oral TID WC   Continuous Infusions:  azithromycin Stopped (10/28/21 2205)   cefTRIAXone (ROCEPHIN)  IV Stopped (10/28/21 2250)     LOS: 2 days   Time spent: 36 mins  Veleda Mun Wynetta Emery, MD How to contact the [facility] Attending or Consulting provider [place] or covering provider during after hours [place], for this patient?  Check the care team in [facility] and look for a) attending/consulting TRH provider listed and b) the [facility] team listed Log into [email or web address] and use [place]'s universal password to access. If you do not have the password, please contact the hospital operator. Locate the [facility] provider you are looking for under [facility] and page to a number that you can be directly reached. If you still have difficulty reaching the provider, please page the [facility] (Director on Call) for the Hospitalists listed on amion for assistance.  10/29/2021, 10:47 AM

## 2021-10-29 NOTE — Hospital Course (Signed)
81 year old female with history of stroke, COPD, chronic respiratory failure on 3 L, systolic and diastolic CHF, persistent atrial fibrillation, ESRD, iron deficiency presenting from her nursing facility [facility]) from which she is a permanent resident secondary to shortness of breath.  Unfortunately patient is too altered at time of admission to provide history.  She was more alert on first presentation to the ER.  She had told the ER staff that she started to have shortness of breath when dialysis finished.  They checked her oxygen on her baseline 3 L nasal cannula and it was 60%.  She came into the ER on nonrebreather.  Work-up revealed a likely pneumonia and she was started on Rocephin and Zithromax.  Patient slowly became more somnolent while she was in the ER.  When I saw her she would not speak, but would look in the eye, and open her eyes to voice.  Patient is DNR.  BiPAP was started.  ABG showed a hypercarbia.  Admitted to stepdown on BiPAP.  Patient does get dialysis Tuesday, Thursday, Saturday.  She appears clinically fluid overloaded.  Patient does have an elevated but stable troponin that is likely demand ischemia in the setting of hypoxia.  Patient is not able to discuss any complaints at this time.   Patient has DNR form at bedside.   It is noted the patient was recently discharged 4 days PTA.  At that admission patient was also volume overloaded.  Her dialysis sessions have been cut short due to leg pain.  She did have a cough productive of white sputum at that time.  She was hypoxic as well with her oxygen sats down to 70s on her baseline 3 L nasal cannula.  Her troponins were lower at that time and 50, 24 during that hospitalization patient continued to have episodes of shortness of breath on her nondialysis days.  This was thought to be multifactorial with COPD, anxiety, and hypervolemia all contributing.  Respiratory reports the patient was calling out every hour for breathing treatment,  and they believed it to be anxiety related.  They do note that her somnolence now is a change from her baseline. Patient started on ceftriaxone and azithromycin.  Her WBC improved from 20.8 to 8.0.  She was weaned off BiPAP back to her baseline oxygen.  Her mental status improved back to baseline.   Her discharge was delayed due to development of sob prior to her d/c 11/02/21.  Further work up did not reveal any worsening clinical condition or new medical issues.  It was likely due to her anxiety in combination with poor capture of waveforms in the setting of her usual chronic medical issues.  She remained clinically stable after HD on 11/04/21.   She was evaluated by speech therapy for her chronic swallowing issues.  They recommended dysphagia 2 diet with thin liquids.

## 2021-10-30 DIAGNOSIS — G9341 Metabolic encephalopathy: Secondary | ICD-10-CM | POA: Diagnosis not present

## 2021-10-30 DIAGNOSIS — J9601 Acute respiratory failure with hypoxia: Secondary | ICD-10-CM | POA: Diagnosis not present

## 2021-10-30 DIAGNOSIS — J449 Chronic obstructive pulmonary disease, unspecified: Secondary | ICD-10-CM | POA: Diagnosis not present

## 2021-10-30 DIAGNOSIS — J189 Pneumonia, unspecified organism: Secondary | ICD-10-CM | POA: Diagnosis not present

## 2021-10-30 LAB — RENAL FUNCTION PANEL
Albumin: 4 g/dL (ref 3.5–5.0)
Anion gap: 17 — ABNORMAL HIGH (ref 5–15)
BUN: 45 mg/dL — ABNORMAL HIGH (ref 8–23)
CO2: 25 mmol/L (ref 22–32)
Calcium: 9.3 mg/dL (ref 8.9–10.3)
Chloride: 92 mmol/L — ABNORMAL LOW (ref 98–111)
Creatinine, Ser: 2.81 mg/dL — ABNORMAL HIGH (ref 0.44–1.00)
GFR, Estimated: 16 mL/min — ABNORMAL LOW (ref >60)
Glucose, Bld: 191 mg/dL — ABNORMAL HIGH (ref 70–99)
Phosphorus: 4.2 mg/dL (ref 2.5–4.6)
Potassium: 3.3 mmol/L — ABNORMAL LOW (ref 3.5–5.1)
Sodium: 134 mmol/L — ABNORMAL LOW (ref 135–145)

## 2021-10-30 LAB — CBC
HCT: 40.2 % (ref 36.0–46.0)
Hemoglobin: 11.8 g/dL — ABNORMAL LOW (ref 12.0–15.0)
MCH: 31.9 pg (ref 26.0–34.0)
MCHC: 29.4 g/dL — ABNORMAL LOW (ref 30.0–36.0)
MCV: 108.6 fL — ABNORMAL HIGH (ref 80.0–100.0)
Platelets: 172 10*3/uL (ref 150–400)
RBC: 3.7 MIL/uL — ABNORMAL LOW (ref 3.87–5.11)
RDW: 18.7 % — ABNORMAL HIGH (ref 11.5–15.5)
WBC: 10.8 10*3/uL — ABNORMAL HIGH (ref 4.0–10.5)
nRBC: 1.3 % — ABNORMAL HIGH (ref 0.0–0.2)

## 2021-10-30 NOTE — Progress Notes (Signed)
Checked patient's O2 on the 2L that I found her on; her sat was at 92%.  Patient is baseline at home 3L,; placed patient back on her baseline.

## 2021-10-30 NOTE — Plan of Care (Signed)
  Problem: Acute Rehab PT Goals(only PT should resolve) Goal: Pt will Roll Supine to Side 10/30/2021 1155 by Lonell Grandchild, PT Flowsheets (Taken 10/30/2021 1155) Pt will Roll Supine to Side: with min assist 10/30/2021 1154 by Lonell Grandchild, PT Outcome: Progressing Goal: Pt Will Go Supine/Side To Sit 10/30/2021 1155 by Lonell Grandchild, PT Flowsheets (Taken 10/30/2021 1155) Pt will go Supine/Side to Sit: with minimal assist 10/30/2021 1154 by Lonell Grandchild, PT Outcome: Progressing Goal: Patient Will Perform Sitting Balance 10/30/2021 1155 by Lonell Grandchild, PT Flowsheets (Taken 10/30/2021 1155) Patient will perform sitting balance: with modified independence 10/30/2021 1154 by Lonell Grandchild, PT Outcome: Progressing   11:55 AM, 10/30/21 Lonell Grandchild, MPT Physical Therapist with [facility] [phone] office [phone] mobile phone

## 2021-10-30 NOTE — Evaluation (Signed)
Physical Therapy Evaluation Patient Details [name] MRN: [id number] DOB: 1940-02-22 Today's Date: 10/30/2021  History of Present Illness  Jody [name] is a 81 y.o. female with medical history significant of ESRD, chronic atrial fibrillation, COPD, hyperlipidemia, hypertension, diet-controlled diabetes mellitus type 2, and more presents to the ED with a chief complaint of dyspnea.  Unfortunately patient is too altered at the time of my exam to give any history.  She was more alert on first presentation to the ER.  She had told the ER staff that she started to have shortness of breath when dialysis finished.  They checked her oxygen on her baseline 3 L nasal cannula and it was 60%.  She came into the ER on nonrebreather.  Work-up revealed a likely pneumonia and she was started on Rocephin and Zithromax.  Patient slowly became more somnolent while she was in the ER.  When I saw her she would not speak, but would look in the eye, and open her eyes to voice.  Patient is DNR.  BiPAP was started.  ABG showed a hypercarbia.  Will admit to stepdown on BiPAP.  Patient does get dialysis Tuesday, Thursday, Saturday.  She appears clinically fluid overloaded.  Patient is an uric so Lasix is not prescribed.  Patient does have an elevated but stable troponin that is likely demand ischemia in the setting of hypoxia.  Patient is not able to discuss any complaints at this time.   Clinical Impression  Patient demonstrates slow labored movement for sitting up at bedside with [facility] partially raised and required use of bed rail and Mod assist.  Patient unable to stand due to BLE weakness and tolerated sitting up at EOB with nursing staff present in room after therapy.  Patient will benefit from continued skilled physical therapy in hospital and recommended venue below to increase strength, balance, endurance for safe ADLs and gait.         Recommendations for follow up therapy are one component of a  multi-disciplinary discharge planning process, led by the attending physician.  Recommendations may be updated based on patient status, additional functional criteria and insurance authorization.  Follow Up Recommendations Skilled nursing-short term rehab (<3 hours/day) Can patient physically be transported by private vehicle: No    Assistance Recommended at Discharge Set up Supervision/Assistance  Patient can return home with the following  A lot of help with bathing/dressing/bathroom;A lot of help with walking and/or transfers;Help with stairs or ramp for entrance;Assistance with cooking/housework    Equipment Recommendations None recommended by PT  Recommendations for Other Services       Functional Status Assessment Patient has had a recent decline in their functional status and/or demonstrates limited ability to make significant improvements in function in a reasonable and predictable amount of time     Precautions / Restrictions Precautions Precautions: Fall Restrictions Weight Bearing Restrictions: No      Mobility  Bed Mobility Overal bed mobility: Needs Assistance Bed Mobility: Supine to Sit     Supine to sit: Mod assist, HOB elevated     General bed mobility comments: increased time, labored movement    Transfers                        Ambulation/Gait                  Stairs            Wheelchair Mobility    Modified Rankin (Stroke Patients  Only)       Balance Overall balance assessment: Needs assistance Sitting-balance support: Feet supported, No upper extremity supported Sitting balance-Leahy Scale: Fair Sitting balance - Comments: fair/good seated at EOB                                     Pertinent Vitals/Pain Pain Assessment Pain Assessment: No/denies pain    Home Living Family/patient expects to be discharged to:: Skilled nursing facility                   Additional Comments: Pt went to  SNF around 03/07/21, but prior to this was living alone with an aide coming 5x/week and occasional assistance from nieces and nephew. Information from chart review and niece confirmed.    Prior Function Prior Level of Function : Needs assist       Physical Assist : Mobility (physical);ADLs (physical) Mobility (physical): Bed mobility;Transfers;Gait;Stairs   Mobility Comments: SNF staff was using [place] for transfers ADLs Comments: assisted by SNF staff     Hand Dominance   Dominant Hand: Right    Extremity/Trunk Assessment   Upper Extremity Assessment Upper Extremity Assessment: Generalized weakness    Lower Extremity Assessment Lower Extremity Assessment: Generalized weakness    Cervical / Trunk Assessment Cervical / Trunk Assessment: Normal  Communication   Communication: No difficulties  Cognition Arousal/Alertness: Awake/alert Behavior During Therapy: WFL for tasks assessed/performed Overall Cognitive Status: Within Functional Limits for tasks assessed                                          General Comments      Exercises     Assessment/Plan    PT Assessment Patient needs continued PT services  PT Problem List Decreased strength;Decreased activity tolerance;Decreased balance;Decreased mobility       PT Treatment Interventions DME instruction;Functional mobility training;Therapeutic activities;Therapeutic exercise;Patient/family education;Wheelchair mobility training;Balance training    PT Goals (Current goals can be found in the Care Plan section)  Acute Rehab PT Goals Patient Stated Goal: return home PT Goal Formulation: With patient Time For Goal Achievement: 11/13/21 Potential to Achieve Goals: Good    Frequency Min 2X/week     Co-evaluation               AM-PAC PT "6 Clicks" Mobility  Outcome Measure Help needed turning from your back to your side while in a flat bed without using bedrails?: A Lot Help needed  moving from lying on your back to sitting on the side of a flat bed without using bedrails?: A Lot Help needed moving to and from a bed to a chair (including a wheelchair)?: Total Help needed standing up from a chair using your arms (e.g., wheelchair or bedside chair)?: Total Help needed to walk in hospital room?: Total Help needed climbing 3-5 steps with a railing? : Total 6 Click Score: 8    End of Session   Activity Tolerance: Patient tolerated treatment well;Patient limited by fatigue Patient left: in bed;with call bell/phone within reach;with nursing/sitter in room Nurse Communication: Mobility status PT Visit Diagnosis: Unsteadiness on feet (R26.81);Other abnormalities of gait and mobility (R26.89);Muscle weakness (generalized) (M62.81)    Time: 7416-3845 PT Time Calculation (min) (ACUTE ONLY): 18 min   Charges:   PT Evaluation $PT Eval Low Complexity: 1  Low PT Treatments $Therapeutic Activity: 8-22 mins        11:53 AM, 10/30/21 Lonell Grandchild, MPT Physical Therapist with [facility] [phone] office [phone] mobile phone

## 2021-10-30 NOTE — Progress Notes (Signed)
PROGRESS NOTE   Delany Steury  IWL:[id number] DOB: 24-Mar-1940 DOA: 10/27/2021 PCP: Monico Blitz, MD   Chief Complaint  Patient presents with   Shortness of Breath   Level of care: Telemetry  Brief Admission History:  81 y.o. female with medical history significant of ESRD, chronic atrial fibrillation, COPD, hyperlipidemia, hypertension, diet-controlled diabetes mellitus type 2, and more presents to the ED with a chief complaint of dyspnea.  Unfortunately patient is too altered at the time of my exam to give any history.  She was more alert on first presentation to the ER.  She had told the ER staff that she started to have shortness of breath when dialysis finished.  They checked her oxygen on her baseline 3 L nasal cannula and it was 60%.  She came into the ER on nonrebreather.  Work-up revealed a likely pneumonia and she was started on Rocephin and Zithromax.  Patient slowly became more somnolent while she was in the ER.  When I saw her she would not speak, but would look in the eye, and open her eyes to voice.  Patient is DNR.  BiPAP was started.  ABG showed a hypercarbia.  Will admit to stepdown on BiPAP.  Patient does get dialysis Tuesday, Thursday, Saturday.  She appears clinically fluid overloaded.  Patient is an uric so Lasix is not prescribed.  Patient does have an elevated but stable troponin that is likely demand ischemia in the setting of hypoxia.  Patient is not able to discuss any complaints at this time.   Patient has DNR form at bedside.   It is noted the patient was recently discharged 4 days PTA.  At that admission patient was also volume overloaded.  Her dialysis sessions have been cut short due to leg pain.  She did have a cough productive of white sputum at that time.  She was hypoxic as well with her oxygen sats down to 70s on her baseline 3 L nasal cannula.  Her troponins were lower at that time and 50, 24 during that hospitalization patient continued to have episodes of  shortness of breath on her nondialysis days.  This was thought to be multifactorial with COPD, anxiety, and hypervolemia all contributing.  Respiratory reports the patient was calling out every hour for breathing treatment, and they believed it to be anxiety related.  They do note that her somnolence now is a change from her baseline.   Assessment and Plan: * Acute respiratory failure with hypoxia ([place]) - Patient noted to be down to 60% oxygen saturation on baseline O2 3 L - Presented with nonrebreather - Related to COPD, fluid overload and Community-acquired pneumonia - Pt was treated with BiPAP - Patient has HD on Tuesday, Thursday, Saturday at baseline, consulted nephro for fluid management -Weaned off BiPAP and now on [place] supplemental oxygen - transfer to med surg DC cardiac monitoring  Elevated troponin - Elevated to 101, 106 - Continue aspirin and statin as well as beta-blocker - No acute ischemic changes on EKG - Likely demand ischemia in the setting of hypoxia, do not suspect ACS  CAP (community acquired pneumonia) - Chest x-ray shows pneumonia difficult to exclude, leukocytosis of 20.8, respiratory failure - Procalcitonin 8.46 - Rocephin and Zithromax started in the ED - Continue Rocephin and Zithromax - Sputum culture, urine antigens for Legionella and strep - Unfortunately blood cultures were not done before antibiotics, patient states of fever we will do blood cultures at that time - COVID and flu negative -  Continue to monitor  Persistent atrial fibrillation (HCC) - Continue metoprolol - Continue Eliquis - Continue to monitor  Acute metabolic encephalopathy - resolved with treatments  - Multifactorial with pneumonia and respiratory failure with hypoxia and hypercapnia  ESRD on dialysis [facility]) - Tuesday Thursday Saturday dialysis - Patient is clinically fluid overloaded - BNP is 4400 - Consult nephro for fluid management - Continue Renvela - Continue renal diet  with fluid restrictions - Continue to monitor; nephrology planned HD for 9/16, pt tolerated well  COPD (chronic obstructive pulmonary disease) (HCC) - Continue albuterol as needed - Scheduled DuoNebs - Continue BiPAP - Recently finished a course of 50 mg prednisone - No wheezing - Continue to monitor  CHF exacerbation (HCC) - Cardiorenal syndrome, fluid overload, HD patient - BNP 4400, troponin 101, 106, continue cycle troponin -EKG is without acute ischemic changes - Continue aspirin, statin, beta-blocker - Last echo was done earlier this month and showed an ejection fraction of 40-45% with global hypokinesis diastolic function could not be evaluated  Hyperlipidemia - Continue pravastatin  DVT prophylaxis: apixaban Code Status: DNR  Family Communication:  Disposition: Status is: Inpatient Remains inpatient appropriate because: IV antibiotics   Consultants:  Nephrology  Palliative care  Procedures:  Hemodialysis 9/16 Antimicrobials:  Ceftriaxone/ azithromycin 9/15>>  Subjective: Pt reports she is breathing a lot better today.   Objective: Vitals:   10/29/21 2037 10/30/21 0001 10/30/21 0418 10/30/21 0731  BP: 119/76 114/64 105/68   Pulse: 88 80 84   Resp: '20 17 20   '$ Temp: 97.8 F (36.6 C) 98.1 F (36.7 C) (!) 97.4 F (36.3 C)   TempSrc: Oral     SpO2: (!) 76% 94% 98% 99%  Weight:      Height:        Intake/Output Summary (Last 24 hours) at 10/30/2021 1227 Last data filed at 10/30/2021 0600 Gross per 24 hour  Intake 360 ml  Output 2200 ml  Net -1840 ml   Filed Weights   10/27/21 1828 10/29/21 1050 10/29/21 1439  Weight: 79.5 kg 81.5 kg 79.3 kg   Examination:  General exam: frail, elderly, chronically ill appearing female, awake, alert, cooperative, Appears calm and comfortable  Respiratory system: rales and crackles heard. Respiratory effort normal. Cardiovascular system: normal S1 & S2 heard. No JVD, murmurs, rubs, gallops or clicks. No pedal  edema. Gastrointestinal system: Abdomen is nondistended, soft and nontender. No organomegaly or masses felt. Normal bowel sounds heard. Central nervous system: Alert and oriented. No focal neurological deficits. Extremities: Symmetric 5 x 5 power. Skin: No rashes, lesions or ulcers. Psychiatry: Judgement and insight appear normal. Mood & affect appropriate.   Data Reviewed: I have personally reviewed following labs and imaging studies  CBC: Recent Labs  Lab 10/27/21 2054 10/28/21 0502 10/29/21 1056 10/30/21 0512  WBC 20.8* 18.7* 10.0 10.8*  NEUTROABS  --  16.7*  --   --   HGB 12.9 12.4 11.2* 11.8*  HCT 43.2 42.4 37.3 40.2  MCV 108.5* 110.1* 105.1* 108.6*  PLT 107* 143* 181 814    Basic Metabolic Panel: Recent Labs  Lab 10/24/21 0417 10/27/21 2054 10/28/21 0502 10/29/21 1056 10/30/21 0512  NA 134* 134* 135 133* 134*  K 4.1 4.1 4.7 3.9 3.3*  CL 94* 93* 96* 95* 92*  CO2 '27 22 24 23 25  '$ GLUCOSE 193* 176* 160* 170* 191*  BUN 54* 43* 48* 66* 45*  CREATININE 3.33* 2.76* 2.91* 3.84* 2.81*  CALCIUM 10.4* 9.7 9.5 9.5 9.3  MG  --   --  2.7*  --   --   PHOS 6.0*  --   --  5.4* 4.2    CBG: Recent Labs  Lab 10/23/21 1819 10/28/21 0746  GLUCAP 224* 155*    Recent Results (from the past 240 hour(s))  Resp Panel by RT-PCR (Flu A&B, Covid) Anterior Nasal Swab     Status: None   Collection Time: 10/20/21  2:19 PM   Specimen: Anterior Nasal Swab  Result Value Ref Range Status   SARS Coronavirus 2 by RT PCR NEGATIVE NEGATIVE Final    Comment: (NOTE) SARS-CoV-2 target nucleic acids are NOT DETECTED.  The SARS-CoV-2 RNA is generally detectable in upper respiratory specimens during the acute phase of infection. The lowest concentration of SARS-CoV-2 viral copies this assay can detect is 138 copies/mL. A negative result does not preclude SARS-Cov-2 infection and should not be used as the sole basis for treatment or other patient management decisions. A negative result may  occur with  improper specimen collection/handling, submission of specimen other than nasopharyngeal swab, presence of viral mutation(s) within the areas targeted by this assay, and inadequate number of viral copies(<138 copies/mL). A negative result must be combined with clinical observations, patient history, and epidemiological information. The expected result is Negative.  Fact Sheet for Patients:  EntrepreneurPulse.com.au  Fact Sheet for Healthcare Providers:  IncredibleEmployment.be  This test is no t yet approved or cleared by the [place] FDA and  has been authorized for detection and/or diagnosis of SARS-CoV-2 by FDA under an Emergency Use Authorization (EUA). This EUA will remain  in effect (meaning this test can be used) for the duration of the COVID-19 declaration under Section 564(b)(1) of the Act, 21 U.S.C.section 360bbb-3(b)(1), unless the authorization is terminated  or revoked sooner.       Influenza A by PCR NEGATIVE NEGATIVE Final   Influenza B by PCR NEGATIVE NEGATIVE Final    Comment: (NOTE) The Xpert Xpress SARS-CoV-2/FLU/RSV plus assay is intended as an aid in the diagnosis of influenza from Nasopharyngeal swab specimens and should not be used as a sole basis for treatment. Nasal washings and aspirates are unacceptable for Xpert Xpress SARS-CoV-2/FLU/RSV testing.  Fact Sheet for Patients: EntrepreneurPulse.com.au  Fact Sheet for Healthcare Providers: IncredibleEmployment.be  This test is not yet approved or cleared by the [place] FDA and has been authorized for detection and/or diagnosis of SARS-CoV-2 by FDA under an Emergency Use Authorization (EUA). This EUA will remain in effect (meaning this test can be used) for the duration of the COVID-19 declaration under Section 564(b)(1) of the Act, 21 U.S.C. section 360bbb-3(b)(1), unless the authorization is terminated  or revoked.  Performed at [facility], [address] [place], [place] [address]   MRSA Next Gen by PCR, Nasal     Status: None   Collection Time: 10/22/21  4:30 PM   Specimen: Nasal Mucosa; Nasal Swab  Result Value Ref Range Status   MRSA by PCR Next Gen NOT DETECTED NOT DETECTED Final    Comment: (NOTE) The GeneXpert MRSA Assay (FDA approved for NASAL specimens only), is one component of a comprehensive MRSA colonization surveillance program. It is not intended to diagnose MRSA infection nor to guide or monitor treatment for MRSA infections. Test performance is not FDA approved in patients less than 27 years old. Performed at [facility], [address] [place], [place] [address]   Resp Panel by RT-PCR (Flu A&B, Covid) Anterior Nasal Swab     Status: None   Collection Time: 10/27/21  6:39 PM   Specimen: Anterior Nasal Swab  Result Value Ref Range Status   SARS Coronavirus 2 by RT PCR NEGATIVE NEGATIVE Final    Comment: (NOTE) SARS-CoV-2 target nucleic acids are NOT DETECTED.  The SARS-CoV-2 RNA is generally detectable in upper respiratory specimens during the acute phase of infection. The lowest concentration of SARS-CoV-2 viral copies this assay can detect is 138 copies/mL. A negative result does not preclude SARS-Cov-2 infection and should not be used as the sole basis for treatment or other patient management decisions. A negative result may occur with  improper specimen collection/handling, submission of specimen other than nasopharyngeal swab, presence of viral mutation(s) within the areas targeted by this assay, and inadequate number of viral copies(<138 copies/mL). A negative result must be combined with clinical observations, patient history, and epidemiological information. The expected result is Negative.  Fact Sheet for Patients:  EntrepreneurPulse.com.au  Fact Sheet for Healthcare Providers:  IncredibleEmployment.be  This  test is no t yet approved or cleared by the [place] FDA and  has been authorized for detection and/or diagnosis of SARS-CoV-2 by FDA under an Emergency Use Authorization (EUA). This EUA will remain  in effect (meaning this test can be used) for the duration of the COVID-19 declaration under Section 564(b)(1) of the Act, 21 U.S.C.section 360bbb-3(b)(1), unless the authorization is terminated  or revoked sooner.       Influenza A by PCR NEGATIVE NEGATIVE Final   Influenza B by PCR NEGATIVE NEGATIVE Final    Comment: (NOTE) The Xpert Xpress SARS-CoV-2/FLU/RSV plus assay is intended as an aid in the diagnosis of influenza from Nasopharyngeal swab specimens and should not be used as a sole basis for treatment. Nasal washings and aspirates are unacceptable for Xpert Xpress SARS-CoV-2/FLU/RSV testing.  Fact Sheet for Patients: EntrepreneurPulse.com.au  Fact Sheet for Healthcare Providers: IncredibleEmployment.be  This test is not yet approved or cleared by the [place] FDA and has been authorized for detection and/or diagnosis of SARS-CoV-2 by FDA under an Emergency Use Authorization (EUA). This EUA will remain in effect (meaning this test can be used) for the duration of the COVID-19 declaration under Section 564(b)(1) of the Act, 21 U.S.C. section 360bbb-3(b)(1), unless the authorization is terminated or revoked.  Performed at [facility], [address], [place], [place] [address]   C Difficile Quick Screen w PCR reflex     Status: Abnormal   Collection Time: 10/29/21  3:41 PM   Specimen: STOOL  Result Value Ref Range Status   C Diff antigen POSITIVE (A) NEGATIVE Final   C Diff toxin NEGATIVE NEGATIVE Final   C Diff interpretation Results are indeterminate. See PCR results.  Final    Comment: Performed at [facility], [address], [place], [place] [address]  C. Diff by PCR, Reflexed     Status: None   Collection Time: 10/29/21   3:41 PM  Result Value Ref Range Status   Toxigenic C. Difficile by PCR NEGATIVE NEGATIVE Final    Comment: Patient is colonized with non toxigenic C. difficile. May not need treatment unless significant symptoms are present. Performed at [facility], [place] [address] [place], [place] [address]      Radiology Studies: No results found.  Scheduled Meds:  apixaban  2.5 mg Oral BID   calcitRIOL  0.5 mcg Oral Q T,Th,Sat-1800   Chlorhexidine Gluconate Cloth  6 each Topical Daily   Chlorhexidine Gluconate Cloth  6 each Topical Q0600   cinacalcet  30 mg  Oral Q T,Th,Sat-1800   feeding supplement (NEPRO CARB STEADY)  237 mL Oral BID BM   folic acid  1 mg Oral Daily   gabapentin  300 mg Oral Daily   ipratropium-albuterol  3 mL Nebulization BID   metoprolol succinate  12.5 mg Oral Daily   midodrine  15 mg Oral TID WC   nystatin  5 mL Oral QID   pravastatin  40 mg Oral q1800   sevelamer carbonate  800 mg Oral TID WC   Continuous Infusions:  azithromycin 500 mg (10/30/21 0011)   cefTRIAXone (ROCEPHIN)  IV 1 g (10/29/21 2158)     LOS: 3 days   Time spent: 29 mins  Paeton Latouche Wynetta Emery, MD How to contact the [facility] Attending or Consulting provider [place] or covering provider during after hours [place], for this patient?  Check the care team in [facility] and look for a) attending/consulting TRH provider listed and b) the [facility] team listed Log into [email or web address] and use [employer] universal password to access. If you do not have the password, please contact the hospital operator. Locate the [facility] provider you are looking for under [facility] and page to a number that you can be directly reached. If you still have difficulty reaching the provider, please page the [facility] (Director on Call) for the Hospitalists listed on amion for assistance.  10/30/2021, 12:27 PM

## 2021-10-30 NOTE — Progress Notes (Signed)
Bipap is PRN order.  Not needed at this time. 

## 2021-10-31 ENCOUNTER — Encounter (HOSPITAL_COMMUNITY): Payer: Self-pay | Admitting: Family Medicine

## 2021-10-31 DIAGNOSIS — J9601 Acute respiratory failure with hypoxia: Secondary | ICD-10-CM | POA: Diagnosis not present

## 2021-10-31 DIAGNOSIS — G9341 Metabolic encephalopathy: Secondary | ICD-10-CM | POA: Diagnosis not present

## 2021-10-31 DIAGNOSIS — Z515 Encounter for palliative care: Secondary | ICD-10-CM | POA: Diagnosis not present

## 2021-10-31 DIAGNOSIS — Z7189 Other specified counseling: Secondary | ICD-10-CM | POA: Diagnosis not present

## 2021-10-31 DIAGNOSIS — J189 Pneumonia, unspecified organism: Secondary | ICD-10-CM | POA: Diagnosis not present

## 2021-10-31 MED ORDER — PHENOL 1.4 % MT LIQD
1.0000 | OROMUCOSAL | Status: DC | PRN
  Administered 2021-10-31 – 2021-11-01 (×2): 1 via OROMUCOSAL
  Filled 2021-10-31: qty 177

## 2021-10-31 MED ORDER — AZITHROMYCIN 250 MG PO TABS: 500.0000 mg | ORAL_TABLET | Freq: Every day | ORAL | Status: DC

## 2021-10-31 MED ORDER — DOXYCYCLINE HYCLATE 100 MG PO TABS
100.0000 mg | ORAL_TABLET | Freq: Two times a day (BID) | ORAL | Status: DC
  Administered 2021-10-31 – 2021-11-02 (×5): 100 mg via ORAL
  Filled 2021-10-31 (×6): qty 1

## 2021-10-31 NOTE — Progress Notes (Signed)
Bipap is PRN order.  Not needed at this time. 

## 2021-10-31 NOTE — TOC Progression Note (Signed)
Transition of Care [facility]) - Progression Note    Patient Details  Name: Mariposa Shores MRN: [id number] Date of Birth: [birth year]  Transition of Care [facility]) CM/SW Contact  Shade Flood, LCSW Phone Number: 10/31/2021, 1:05 PM  Clinical Narrative:     TOC following. PT recommending SNF rehab at dc. Spoke with [facility] where pt is in long term care and was informed that if insurance approves, they can provide the rehab. TOC will start auth process today. MD anticipating dc in 1-2 days. Will follow.  Expected Discharge Plan: [place] Barriers to Discharge: Continued Medical Work up  Expected Discharge Plan and Services Expected Discharge Plan: [place] In-house Referral: Clinical Social Work   Post Acute Care Choice: Resumption of Svcs/PTA Provider Living arrangements for the past 2 months: [place]                                       Social Determinants of Health (SDOH) Interventions    Readmission Risk Interventions    08/15/2021   11:49 AM 06/07/2021    2:47 PM 06/01/2021   10:59 AM  Readmission Risk Prevention Plan  Transportation Screening Complete Complete Complete  Medication Review Press photographer)  Complete Complete  PCP or Specialist appointment within 3-5 days of discharge  Complete   HRI or Home Care Consult Complete Complete Complete  SW Recovery Care/Counseling Consult Complete Complete Complete  Palliative Care Screening Not Applicable Complete Not Applicable  Skilled Nursing Facility Complete Complete Complete

## 2021-10-31 NOTE — Progress Notes (Signed)
PROGRESS NOTE   Jody [name]  XIP:[id number] DOB: 1940-11-07 DOA: 10/27/2021 PCP: Monico Blitz, MD   Chief Complaint  Patient presents with   Shortness of Breath   Level of care: Med-Surg  Brief Admission History:  81 y.o. female with medical history significant of ESRD, chronic atrial fibrillation, COPD, hyperlipidemia, hypertension, diet-controlled diabetes mellitus type 2, and more presents to the ED with a chief complaint of dyspnea.  Unfortunately patient is too altered at the time of my exam to give any history.  She was more alert on first presentation to the ER.  She had told the ER staff that she started to have shortness of breath when dialysis finished.  They checked her oxygen on her baseline 3 L nasal cannula and it was 60%.  She came into the ER on nonrebreather.  Work-up revealed a likely pneumonia and she was started on Rocephin and Zithromax.  Patient slowly became more somnolent while she was in the ER.  When I saw her she would not speak, but would look in the eye, and open her eyes to voice.  Patient is DNR.  BiPAP was started.  ABG showed a hypercarbia.  Will admit to stepdown on BiPAP.  Patient does get dialysis Tuesday, Thursday, Saturday.  She appears clinically fluid overloaded.  Patient is an uric so Lasix is not prescribed.  Patient does have an elevated but stable troponin that is likely demand ischemia in the setting of hypoxia.  Patient is not able to discuss any complaints at this time.   Patient has DNR form at bedside.   It is noted the patient was recently discharged 4 days PTA.  At that admission patient was also volume overloaded.  Her dialysis sessions have been cut short due to leg pain.  She did have a cough productive of white sputum at that time.  She was hypoxic as well with her oxygen sats down to 70s on her baseline 3 L nasal cannula.  Her troponins were lower at that time and 50, 24 during that hospitalization patient continued to have episodes of  shortness of breath on her nondialysis days.  This was thought to be multifactorial with COPD, anxiety, and hypervolemia all contributing.  Respiratory reports the patient was calling out every hour for breathing treatment, and they believed it to be anxiety related.  They do note that her somnolence now is a change from her baseline.   Assessment and Plan: * Acute respiratory failure with hypoxia ([place]) - Patient noted to be down to 60% oxygen saturation on baseline O2 3 L - Presented with nonrebreather - Related to COPD, fluid overload and Community-acquired pneumonia - Pt was treated with BiPAP - Patient has HD on Tuesday, Thursday, Saturday at baseline, consulted nephro for fluid management -Weaned off BiPAP and now on [place] supplemental oxygen - transfer to med surg DC cardiac monitoring  Elevated troponin - Elevated to 101, 106 - Continue aspirin and statin as well as beta-blocker - No acute ischemic changes on EKG - Likely demand ischemia in the setting of hypoxia, do not suspect ACS  CAP (community acquired pneumonia) - Chest x-ray shows pneumonia difficult to exclude, leukocytosis of 20.8, respiratory failure - Procalcitonin 8.46 - Rocephin and Zithromax started in the ED - Initially treated with Rocephin and Zithromax, transitioned to oral doxycycline 9/18 - Sputum culture, urine antigens for Legionella and strep - Unfortunately blood cultures were not done before antibiotics, patient states of fever we will do blood cultures at that  time - COVID and flu negative - Continue to monitor  Persistent atrial fibrillation (HCC) - Continue metoprolol - Continue Eliquis - Continue to monitor  Acute metabolic encephalopathy - resolved with treatments  - Multifactorial with pneumonia and respiratory failure with hypoxia and hypercapnia  ESRD on dialysis [facility]) - Tuesday Thursday Saturday dialysis - Patient is clinically fluid overloaded - BNP is 4400 - Consult nephro for fluid  management - Continue Renvela - Continue renal diet with fluid restrictions - Continue to monitor; nephrology planned HD for 9/16, pt tolerated well, plan for HD 9/19  COPD (chronic obstructive pulmonary disease) (HCC) - Continue albuterol as needed - Scheduled DuoNebs - Continue BiPAP - Recently finished a course of 50 mg prednisone - No wheezing - Continue to monitor  CHF exacerbation (HCC) - Cardiorenal syndrome, fluid overload, HD patient - BNP 4400, troponin 101, 106, continue cycle troponin -EKG is without acute ischemic changes - Continue aspirin, statin, beta-blocker - Last echo was done earlier this month and showed an ejection fraction of 40-45% with global hypokinesis diastolic function could not be evaluated  Hyperlipidemia - Continue pravastatin  DVT prophylaxis: apixaban Code Status: DNR  Family Communication:  Disposition: Status is: Inpatient   Consultants:  Nephrology  Palliative care  Procedures:  Hemodialysis 9/16 Antimicrobials:  Ceftriaxone/ azithromycin 9/15>>9/18 Doxycycline 9/18>> Subjective: Pt reports no SOB today.   Objective: Vitals:   10/31/21 0601 10/31/21 0807 10/31/21 0902 10/31/21 1227  BP: 99/65 104/68  115/67  Pulse: 83 86 75 (!) 49  Resp:   16   Temp: 97.6 F (36.4 C)   97.6 F (36.4 C)  TempSrc: Oral     SpO2: 94%  98%   Weight:      Height:        Intake/Output Summary (Last 24 hours) at 10/31/2021 1423 Last data filed at 10/31/2021 0900 Gross per 24 hour  Intake 720.31 ml  Output --  Net 720.31 ml   Filed Weights   10/27/21 1828 10/29/21 1050 10/29/21 1439  Weight: 79.5 kg 81.5 kg 79.3 kg   Examination:  General exam: frail, elderly, chronically ill appearing female, awake, alert, cooperative, Appears calm and comfortable  Respiratory system: rales and crackles heard. Respiratory effort normal. Cardiovascular system: normal S1 & S2 heard. No JVD, murmurs, rubs, gallops or clicks. No pedal edema. Gastrointestinal  system: Abdomen is nondistended, soft and nontender. No organomegaly or masses felt. Normal bowel sounds heard. Central nervous system: Alert and oriented. No focal neurological deficits. Extremities: Symmetric 5 x 5 power. Skin: No rashes, lesions or ulcers. Psychiatry: Judgement and insight appear normal. Mood & affect appropriate.   Data Reviewed: I have personally reviewed following labs and imaging studies  CBC: Recent Labs  Lab 10/27/21 2054 10/28/21 0502 10/29/21 1056 10/30/21 0512  WBC 20.8* 18.7* 10.0 10.8*  NEUTROABS  --  16.7*  --   --   HGB 12.9 12.4 11.2* 11.8*  HCT 43.2 42.4 37.3 40.2  MCV 108.5* 110.1* 105.1* 108.6*  PLT 107* 143* 181 967    Basic Metabolic Panel: Recent Labs  Lab 10/27/21 2054 10/28/21 0502 10/29/21 1056 10/30/21 0512  NA 134* 135 133* 134*  K 4.1 4.7 3.9 3.3*  CL 93* 96* 95* 92*  CO2 '22 24 23 25  '$ GLUCOSE 176* 160* 170* 191*  BUN 43* 48* 66* 45*  CREATININE 2.76* 2.91* 3.84* 2.81*  CALCIUM 9.7 9.5 9.5 9.3  MG  --  2.7*  --   --   PHOS  --   --  5.4* 4.2    CBG: Recent Labs  Lab 10/28/21 0746  GLUCAP 155*    Recent Results (from the past 240 hour(s))  MRSA Next Gen by PCR, Nasal     Status: None   Collection Time: 10/22/21  4:30 PM   Specimen: Nasal Mucosa; Nasal Swab  Result Value Ref Range Status   MRSA by PCR Next Gen NOT DETECTED NOT DETECTED Final    Comment: (NOTE) The GeneXpert MRSA Assay (FDA approved for NASAL specimens only), is one component of a comprehensive MRSA colonization surveillance program. It is not intended to diagnose MRSA infection nor to guide or monitor treatment for MRSA infections. Test performance is not FDA approved in patients less than 30 years old. Performed at [facility], [address], [place], [place] [address]   Resp Panel by RT-PCR (Flu A&B, Covid) Anterior Nasal Swab     Status: None   Collection Time: 10/27/21  6:39 PM   Specimen: Anterior Nasal Swab  Result Value Ref Range  Status   SARS Coronavirus 2 by RT PCR NEGATIVE NEGATIVE Final    Comment: (NOTE) SARS-CoV-2 target nucleic acids are NOT DETECTED.  The SARS-CoV-2 RNA is generally detectable in upper respiratory specimens during the acute phase of infection. The lowest concentration of SARS-CoV-2 viral copies this assay can detect is 138 copies/mL. A negative result does not preclude SARS-Cov-2 infection and should not be used as the sole basis for treatment or other patient management decisions. A negative result may occur with  improper specimen collection/handling, submission of specimen other than nasopharyngeal swab, presence of viral mutation(s) within the areas targeted by this assay, and inadequate number of viral copies(<138 copies/mL). A negative result must be combined with clinical observations, patient history, and epidemiological information. The expected result is Negative.  Fact Sheet for Patients:  EntrepreneurPulse.com.au  Fact Sheet for Healthcare Providers:  IncredibleEmployment.be  This test is no t yet approved or cleared by the [place] FDA and  has been authorized for detection and/or diagnosis of SARS-CoV-2 by FDA under an Emergency Use Authorization (EUA). This EUA will remain  in effect (meaning this test can be used) for the duration of the COVID-19 declaration under Section 564(b)(1) of the Act, 21 U.S.C.section 360bbb-3(b)(1), unless the authorization is terminated  or revoked sooner.       Influenza A by PCR NEGATIVE NEGATIVE Final   Influenza B by PCR NEGATIVE NEGATIVE Final    Comment: (NOTE) The Xpert Xpress SARS-CoV-2/FLU/RSV plus assay is intended as an aid in the diagnosis of influenza from Nasopharyngeal swab specimens and should not be used as a sole basis for treatment. Nasal washings and aspirates are unacceptable for Xpert Xpress SARS-CoV-2/FLU/RSV testing.  Fact Sheet for  Patients: EntrepreneurPulse.com.au  Fact Sheet for Healthcare Providers: IncredibleEmployment.be  This test is not yet approved or cleared by the [place] FDA and has been authorized for detection and/or diagnosis of SARS-CoV-2 by FDA under an Emergency Use Authorization (EUA). This EUA will remain in effect (meaning this test can be used) for the duration of the COVID-19 declaration under Section 564(b)(1) of the Act, 21 U.S.C. section 360bbb-3(b)(1), unless the authorization is terminated or revoked.  Performed at [facility], [address], [place], [place] [address]   C Difficile Quick Screen w PCR reflex     Status: Abnormal   Collection Time: 10/29/21  3:41 PM   Specimen: STOOL  Result Value Ref Range Status   C Diff antigen POSITIVE (A) NEGATIVE Final  C Diff toxin NEGATIVE NEGATIVE Final   C Diff interpretation Results are indeterminate. See PCR results.  Final    Comment: Performed at [facility], [address] [place], [place] [address]  C. Diff by PCR, Reflexed     Status: None   Collection Time: 10/29/21  3:41 PM  Result Value Ref Range Status   Toxigenic C. Difficile by PCR NEGATIVE NEGATIVE Final    Comment: Patient is colonized with non toxigenic C. difficile. May not need treatment unless significant symptoms are present. Performed at [facility], [place] [address] [place], [place] [address]      Radiology Studies: No results found.  Scheduled Meds:  apixaban  2.5 mg Oral BID   azithromycin  500 mg Oral Daily   calcitRIOL  0.5 mcg Oral Q T,Th,Sat-1800   Chlorhexidine Gluconate Cloth  6 each Topical Daily   Chlorhexidine Gluconate Cloth  6 each Topical Q0600   cinacalcet  30 mg Oral Q T,Th,Sat-1800   feeding supplement (NEPRO CARB STEADY)  237 mL Oral BID BM   folic acid  1 mg Oral Daily   gabapentin  300 mg Oral Daily   ipratropium-albuterol  3 mL Nebulization BID   metoprolol succinate  12.5 mg Oral Daily    midodrine  15 mg Oral TID WC   nystatin  5 mL Oral QID   pravastatin  40 mg Oral q1800   sevelamer carbonate  800 mg Oral TID WC   Continuous Infusions:  cefTRIAXone (ROCEPHIN)  IV Stopped (10/30/21 2248)     LOS: 4 days   Time spent: 35 mins  Neeley Sedivy Wynetta Emery, MD How to contact the [facility] Attending or Consulting provider [place] or covering provider during after hours [place], for this patient?  Check the care team in [facility] and look for a) attending/consulting [facility] provider listed and b) the [facility] team listed Log into [email or web address] and use [place]'s universal password to access. If you do not have the password, please contact the hospital operator. Locate the [facility] provider you are looking for under [facility] and page to a number that you can be directly reached. If you still have difficulty reaching the provider, please page the [facility] (Director on Call) for the Hospitalists listed on amion for assistance.  10/31/2021, 2:23 PM

## 2021-10-31 NOTE — Consult Note (Signed)
Consultation Note Date: 10/31/2021   Patient [name]  DOB: Jun 26, 1940  MRN: [id number]  Age / Sex: 81 y.o., female  PCP: Monico Blitz, MD Referring Physician: Murlean Iba, MD  Reason for Consultation: Establishing goals of care  HPI/Patient Profile: 81 y.o. female  with past medical history of ESRD on HD for about 8 years, chronic A-fib, HTN/HLD, COPD, DM 2/diet-controlled, anemia of chronic disease, arthritis in the legs, chronic back pain, resident of long-term care facility [facility], admitted on 10/27/2021 with acute respiratory failure with hypoxia related to COPD, fluid overload, community-acquired pneumonia initially treated with BiPAP.   Clinical Assessment and Goals of Care: I have reviewed medical records including EPIC notes, labs and imaging, received report from RN, assessed the patient.  Jody [name] is lying quietly in bed.  She greets me, making and mostly keeping eye contact.  She is alert and oriented, able to make her basic needs known.  There is no family at bedside at this time although bedside nursing staff is present attending to needs.  We meet at bedside to discuss diagnosis prognosis, GOC, EOL wishes, disposition and options.  I introduced Palliative Medicine as specialized medical care for people living with serious illness. It focuses on providing relief from the symptoms and stress of a serious illness. The goal is to improve quality of life for both the patient and the family.  We focused on their current illness.  We talk about her respiratory issues and the treatment plan.  I ask how she is tolerating hemodialysis and she states that she is doing well with that.  She tells me that she has been doing dialysis for 8 years and has no plans to stop.  She is able to tell me that if she were to stop, she would die.  The natural disease trajectory and expectations at EOL were  discussed.  Advanced directives, concepts specific to code status, artifical feeding and hydration, and rehospitalization were considered and discussed.  Jody [name] is DNR.  Discussed the importance of continued conversation with family and the medical providers regarding overall plan of care and treatment options, ensuring decisions are within the context of the patient's values and GOCs.  Questions and concerns were addressed.  The patient was encouraged to call with questions or concerns.  PMT will continue to support holistically.  Conference with attending, bedside nursing staff, transition of care team related to patient condition, goals of care, disposition   HCPOA NEXT OF KIN -Jody [name] names her niece, [name], as her healthcare surrogate.    SUMMARY OF RECOMMENDATIONS   At this point continue to treat the treatable but no CPR or intubation Return to long-term care at [facility] after short-term rehab  Code Status/Advance Care Planning: DNR  Symptom Management:  Per hospitalist, no additional needs at this time.  Palliative Prophylaxis:  Oral Care and Turn Reposition  Additional Recommendations (Limitations, Scope, Preferences): Treat the treatable but no CPR or intubation  Psycho-social/Spiritual:  Desire for further Chaplaincy support:no  Additional Recommendations: Caregiving  Support/Resources  Prognosis:  Unable to determine, based on outcomes.  Artificially enhanced by hemodialysis.  Discharge Planning:  Short term rehab at [place] then return to LTC at [facility].       Primary Diagnoses: Present on Admission:  Acute respiratory failure with hypoxia ([place])  Acute metabolic encephalopathy  Persistent atrial fibrillation (HCC)  CAP (community acquired pneumonia)  Hyperlipidemia  COPD (chronic obstructive pulmonary disease) (HCC)  Elevated troponin  Pressure injury of skin   I have reviewed the medical record, interviewed the patient and family, and  examined the patient. The following aspects are pertinent.  Past Medical History:  Diagnosis Date   Anemia in chronic kidney disease (CODE) 06/05/2015   Arthritis    "bad in my legs" (12/07/2014)   Chronic atrial fibrillation ([place]) 06/05/2015   Chronic back pain    "dr said my spine is crooked"   Chronic bronchitis ([place])    "get it q yr" (58/52/7782)   Complication of anesthesia    headache for 2 days after surgery in May 2019   COPD (chronic obstructive pulmonary disease) ([place])    ESRD on hemodialysis ([place]) 06/05/2015   Gait difficulty 09/02/2014   Hypercholesterolemia    Hypertension    Neuropathy 2015   in legs   NSVT (nonsustained ventricular tachycardia) ([place])    Sinus brady-tachy syndrome ([place])    Stroke ([place]) 05/2014   denies residual on 12/07/2014   Type II diabetes mellitus ([place])    Social History   Socioeconomic History   Marital status: Widowed    Spouse name: Not on file   Number of children: 0   Years of education: 9   Highest education level: Not on file  Occupational History   Occupation: retired  Tobacco Use   Smoking status: Former    Packs/day: 0.50    Years: 50.00    Total pack years: 25.00    Types: Cigarettes    Start date: 03/07/1953    Quit date: 01/14/2004    Years since quitting: 17.8    Passive exposure: Never   Smokeless tobacco: Never   Tobacco comments:    "quit smoking cigarettes  in ~ 2005"  Vaping Use   Vaping Use: Never used  Substance and Sexual Activity   Alcohol use: No    Alcohol/week: 0.0 standard drinks of alcohol    Comment: 10/242016 "quit drinking beer in ~ 1977"   Drug use: No   Sexual activity: Never  Other Topics Concern   Not on file  Social History Narrative   Patient drinks caffeine a few times a week.   Patient is right handed.   Social Determinants of Health   Financial Resource Strain: Not on file  Food Insecurity: No Food Insecurity (10/28/2021)   Hunger Vital Sign    Worried About Running Out of  Food in the Last Year: Never true    Ran Out of Food in the Last Year: Never true  Transportation Needs: No Transportation Needs (10/28/2021)   PRAPARE - Hydrologist (Medical): No    Lack of Transportation (Non-Medical): No  Physical Activity: Not on file  Stress: Not on file  Social Connections: Not on file   Family History  Problem Relation Age of Onset   Cancer Other    Heart attack Brother    Cancer Sister        breast   Alcohol abuse Brother    Scheduled Meds:  apixaban  2.5 mg Oral BID   azithromycin  500 mg Oral Daily   calcitRIOL  0.5 mcg Oral Q T,Th,Sat-1800   Chlorhexidine Gluconate Cloth  6 each Topical Daily   Chlorhexidine Gluconate Cloth  6 each Topical Q0600   cinacalcet  30 mg Oral Q T,Th,Sat-1800   feeding supplement (NEPRO CARB STEADY)  237 mL Oral BID BM   folic acid  1 mg Oral Daily   gabapentin  300 mg Oral Daily   ipratropium-albuterol  3 mL Nebulization BID   metoprolol succinate  12.5 mg Oral Daily   midodrine  15 mg Oral TID WC   nystatin  5 mL Oral QID   pravastatin  40 mg Oral q1800   sevelamer carbonate  800 mg Oral TID WC   Continuous Infusions:  cefTRIAXone (ROCEPHIN)  IV Stopped (10/30/21 2248)   PRN Meds:.acetaminophen **OR** acetaminophen, albuterol, fentaNYL (SUBLIMAZE) injection, ondansetron **OR** ondansetron (ZOFRAN) IV, oxyCODONE Medications Prior to Admission:  Prior to Admission medications   Medication Sig Start Date End Date Taking? Authorizing Provider  albuterol (PROVENTIL) (2.5 MG/3ML) 0.083% nebulizer solution Take 2.5 mg by nebulization every 6 (six) hours as needed for wheezing or shortness of breath.   Yes [provider]  albuterol (VENTOLIN HFA) 108 (90 Base) MCG/ACT inhaler Inhale 2 puffs into the lungs every 6 (six) hours as needed for wheezing or shortness of breath. Reported on 03/01/2015 Patient taking differently: Inhale 1-2 puffs into the lungs every 6 (six) hours as needed for  wheezing or shortness of breath. Reported on 03/01/2015 07/30/21  Yes Emokpae, Courage, MD  ALPRAZolam (XANAX) 0.25 MG tablet Take 0.25 mg by mouth 3 (three) times daily as needed. 10/26/21  Yes [provider]  apixaban (ELIQUIS) 2.5 MG TABS tablet Take 2.5 mg by mouth 2 (two) times daily.   Yes [provider]  dextromethorphan-guaiFENesin (MUCINEX DM) 30-600 MG 12hr tablet Take 1 tablet by mouth 2 (two) times daily. 07/30/21  Yes Emokpae, Courage, MD  ferrous sulfate 325 (65 FE) MG tablet Take 325 mg by mouth 2 (two) times daily with a meal.   Yes [provider]  folic acid (FOLVITE) 720 MCG tablet Take 800 mcg by mouth daily.   Yes [provider]  gabapentin (NEURONTIN) 300 MG capsule Take 300 mg by mouth daily. 05/11/21  Yes [provider]  ipratropium-albuterol (DUONEB) 0.5-2.5 (3) MG/3ML SOLN Take 3 mLs by nebulization every 4 (four) hours as needed. 10/24/21  Yes Tat, Shanon Brow, MD  lidocaine-prilocaine (EMLA) cream Apply 1 Application topically as needed (pain).   Yes [provider]  metoprolol succinate (TOPROL-XL) 25 MG 24 hr tablet Take 1 tablet (25 mg total) by mouth daily. 10/24/21  Yes Tat, Shanon Brow, MD  midodrine (PROAMATINE) 10 MG tablet Take 1 tablet (10 mg total) by mouth 3 (three) times daily with meals. 10/24/21  Yes Tat, Shanon Brow, MD  Multiple Vitamins-Minerals (PRESERVISION AREDS) TABS Take 1 tablet by mouth at bedtime.   Yes [provider]  Nutritional Supplements (FEEDING SUPPLEMENT, NEPRO CARB STEADY,) LIQD Take 237 mLs by mouth 2 (two) times daily between meals.   Yes [provider]  OXYGEN Inhale into the lungs. May use 2-4 liters   Yes [provider]  pravastatin (PRAVACHOL) 40 MG tablet Take 40 mg by mouth daily. 09/28/21  Yes [provider]  predniSONE (DELTASONE) 20 MG tablet  09/02/21  Yes [provider]  predniSONE (DELTASONE) 50 MG tablet Take 1 tablet (50 mg total)  by mouth  daily with breakfast. X 3 days 10/25/21  Yes Tat, Shanon Brow, MD  sevelamer carbonate (RENVELA) 800 MG tablet Take 800 mg by mouth 3 (three) times daily with meals.  07/22/19  Yes [provider]  Skin Protectants, Misc. (CALAZIME SKIN PROTECTANT EX) Apply 1 Application topically 3 (three) times daily. Apply to sacrum and right buttocks every shift until healed   Yes [provider]  acetaminophen (TYLENOL) 325 MG tablet Take 325 mg by mouth 3 (three) times daily.    [provider]  calcitRIOL (ROCALTROL) 0.5 MCG capsule Take 1 capsule (0.5 mcg total) by mouth Every Tuesday,Thursday,and Saturday with dialysis. Patient not taking: Reported on 10/27/2021 10/25/21   Orson Eva, MD  cinacalcet (SENSIPAR) 30 MG tablet Take 1 tablet (30 mg total) by mouth Every Tuesday,Thursday,and Saturday with dialysis. Patient not taking: Reported on 10/27/2021 10/25/21   Orson Eva, MD  [facility] DIGIHALER 108 [phone] Base) MCG/ACT AEPB Inhale into the lungs. 10/27/21   [provider]   Allergies  Allergen Reactions   Codeine Nausea And Vomiting   Review of Systems  Unable to perform ROS: Age    Physical Exam Vitals and nursing note reviewed.  Constitutional:      General: She is not in acute distress. Cardiovascular:     Rate and Rhythm: Normal rate.  Pulmonary:     Effort: Pulmonary effort is normal.  Skin:    General: Skin is warm and dry.  Neurological:     Mental Status: She is alert and oriented to person, place, and time.  Psychiatric:        Mood and Affect: Mood normal.        Behavior: Behavior normal.     Vital Signs: BP 115/67   Pulse (!) 49   Temp 97.6 F (36.4 C)   Resp 16   Ht '5\' 7"'$  (1.702 m)   Wt 79.3 kg   SpO2 98%   BMI 27.38 kg/m  Pain Scale: 0-10 POSS *See Group Information*: 1-Acceptable,Awake and alert Pain Score: 0-No pain   SpO2: SpO2: 98 % O2 Device:SpO2: 98 % O2 Flow Rate: .O2 Flow Rate (L/min): 3 L/min  IO: Intake/output summary:   Intake/Output Summary (Last 24 hours) at 10/31/2021 1340 Last data filed at 10/31/2021 0900 Gross per 24 hour  Intake 720.31 ml  Output --  Net 720.31 ml    LBM: Last BM Date : 10/30/21 Baseline Weight: Weight: 79.5 kg Most recent weight: Weight: 79.3 kg     Palliative Assessment/Data:   Flowsheet Rows    Flowsheet Row Most Recent Value  Intake Tab   Referral Department Hospitalist  Unit at Time of Referral Med/Surg Unit  Palliative Care Primary Diagnosis Pulmonary  Date Notified 10/28/21  Palliative Care Type Return patient Palliative Care  Reason for referral Clarify Goals of Care  Date of Admission 10/27/21  Date first seen by Palliative Care 10/31/21  # of days Palliative referral response time 3 Day(s)  # of days IP prior to Palliative referral 1  Clinical Assessment   Palliative Performance Scale Score 30%  Pain Max last 24 hours Not able to report  Pain Min Last 24 hours Not able to report  Dyspnea Max Last 24 Hours Not able to report  Dyspnea Min Last 24 hours Not able to report  Psychosocial & Spiritual Assessment   Palliative Care Outcomes        Time In: 1030 Time Out: 1125 Time Total: 55 minutes Greater than  50%  of this time was spent counseling and coordinating care related to the above assessment and plan.  Signed by: Drue Novel, NP   Please contact Palliative Medicine Team phone at [phone] for questions and concerns.  For individual provider: See Shea Evans

## 2021-10-31 NOTE — Progress Notes (Signed)
Patient ID: Jody [name], female   DOB: November 21, 1940, 81 y.o.   MRN: [id number] S: Feeling better but too weak to feed herself and needs assistance. O:BP 104/68   Pulse 86   Temp 97.6 F (36.4 C) (Oral)   Resp 19   Ht '5\' 7"'$  (1.702 m)   Wt 79.3 kg   SpO2 94%   BMI 27.38 kg/m   Intake/Output Summary (Last 24 hours) at 10/31/2021 0829 Last data filed at 10/31/2021 0726 Gross per 24 hour  Intake 960.31 ml  Output --  Net 960.31 ml   Intake/Output: I/O last 3 completed shifts: In: 600 [P.O.:600] Out: -   Intake/Output this shift:  Total I/O In: 600.3 [IV Piggyback:600.3] Out: -  Weight change:  Gen: Frail, elderly female in NAD CVS: IRR IRR Resp:CTA Abd:+BS, soft, NT/ND Ext:1+ pretibial edema bilaterally, RUE AVF +T/B  Recent Labs  Lab 10/27/21 2054 10/28/21 0502 10/29/21 1056 10/30/21 0512  NA 134* 135 133* 134*  K 4.1 4.7 3.9 3.3*  CL 93* 96* 95* 92*  CO2 '22 24 23 25  '$ GLUCOSE 176* 160* 170* 191*  BUN 43* 48* 66* 45*  CREATININE 2.76* 2.91* 3.84* 2.81*  ALBUMIN 4.1 3.9 3.6 4.0  CALCIUM 9.7 9.5 9.5 9.3  PHOS  --   --  5.4* 4.2  AST 29 133*  --   --   ALT 32 163*  --   --    Liver Function Tests: Recent Labs  Lab 10/27/21 2054 10/28/21 0502 10/29/21 1056 10/30/21 0512  AST 29 133*  --   --   ALT 32 163*  --   --   ALKPHOS 109 111  --   --   BILITOT 1.3* 1.1  --   --   PROT 7.3 7.0  --   --   ALBUMIN 4.1 3.9 3.6 4.0   No results for input(s): "LIPASE", "AMYLASE" in the last 168 hours. No results for input(s): "AMMONIA" in the last 168 hours. CBC: Recent Labs  Lab 10/27/21 2054 10/28/21 0502 10/29/21 1056 10/30/21 0512  WBC 20.8* 18.7* 10.0 10.8*  NEUTROABS  --  16.7*  --   --   HGB 12.9 12.4 11.2* 11.8*  HCT 43.2 42.4 37.3 40.2  MCV 108.5* 110.1* 105.1* 108.6*  PLT 107* 143* 181 172   Cardiac Enzymes: No results for input(s): "CKTOTAL", "CKMB", "CKMBINDEX", "TROPONINI" in the last 168 hours. CBG: Recent Labs  Lab 10/28/21 0746  GLUCAP  155*    Iron Studies: No results for input(s): "IRON", "TIBC", "TRANSFERRIN", "FERRITIN" in the last 72 hours. Studies/Results: No results found.  apixaban  2.5 mg Oral BID   calcitRIOL  0.5 mcg Oral Q T,Th,Sat-1800   Chlorhexidine Gluconate Cloth  6 each Topical Daily   Chlorhexidine Gluconate Cloth  6 each Topical Q0600   cinacalcet  30 mg Oral Q T,Th,Sat-1800   feeding supplement (NEPRO CARB STEADY)  237 mL Oral BID BM   folic acid  1 mg Oral Daily   gabapentin  300 mg Oral Daily   ipratropium-albuterol  3 mL Nebulization BID   metoprolol succinate  12.5 mg Oral Daily   midodrine  15 mg Oral TID WC   nystatin  5 mL Oral QID   pravastatin  40 mg Oral q1800   sevelamer carbonate  800 mg Oral TID WC    BMET    Component Value Date/Time   NA 134 (L) 10/30/2021 0512   K 3.3 (L) 10/30/2021 0512   CL  92 (L) 10/30/2021 0512   CO2 25 10/30/2021 0512   GLUCOSE 191 (H) 10/30/2021 0512   BUN 45 (H) 10/30/2021 0512   CREATININE 2.81 (H) 10/30/2021 0512   CALCIUM 9.3 10/30/2021 0512   GFRNONAA 16 (L) 10/30/2021 0512   GFRAA 9 (L) 10/27/2019 0112   CBC    Component Value Date/Time   WBC 10.8 (H) 10/30/2021 0512   RBC 3.70 (L) 10/30/2021 0512   HGB 11.8 (L) 10/30/2021 0512   HCT 40.2 10/30/2021 0512   PLT 172 10/30/2021 0512   MCV 108.6 (H) 10/30/2021 0512   MCH 31.9 10/30/2021 0512   MCHC 29.4 (L) 10/30/2021 0512   RDW 18.7 (H) 10/30/2021 0512   LYMPHSABS 0.6 (L) 10/28/2021 0502   MONOABS 1.3 (H) 10/28/2021 0502   EOSABS 0.0 10/28/2021 0502   BASOSABS 0.0 10/28/2021 0502   OP HD orders:  [facility], TTS, 3 hours 15 minutes.  EDW 81 kg.  2K/2.5 calcium.  RUE AVF 400/500.  Meds: Mircera 200 mcg every 2 weeks, Venofer 50 mg q, calcitriol 0.5 mcg 3 times a week, Sensipar 30 mg 3 times a week.  No heparin (history of GI bleed)  Assessment/Plan:  SOB - presumably due to HCAP.  Improved with oxygen, antibiotics and HD. ESRD - will continue with HD on TTS schedule while she  remains an inpatient. Hypotension - recent increase of midodrine to 15 mg tid Volume - has lower extremity edema and UF limited by low bp.  Will plan for HD tomorrow and cool dialysate and dose with midodrine and IV albumin to help facilitate UF. Anemia of ESKD - hgb stable no esa Atrial fibrillation - persistent on metoprolol and Eliquis.  FTT -  she has had multiple admissions (monthly) and lives in [place].  Currently DNR.  Will need outpatient palliative care.  Donetta Potts, MD [facility]

## 2021-11-01 DIAGNOSIS — G9341 Metabolic encephalopathy: Secondary | ICD-10-CM | POA: Diagnosis not present

## 2021-11-01 DIAGNOSIS — J189 Pneumonia, unspecified organism: Secondary | ICD-10-CM | POA: Diagnosis not present

## 2021-11-01 DIAGNOSIS — Z7189 Other specified counseling: Secondary | ICD-10-CM | POA: Diagnosis not present

## 2021-11-01 DIAGNOSIS — R1310 Dysphagia, unspecified: Secondary | ICD-10-CM

## 2021-11-01 DIAGNOSIS — J9601 Acute respiratory failure with hypoxia: Secondary | ICD-10-CM | POA: Diagnosis not present

## 2021-11-01 DIAGNOSIS — J449 Chronic obstructive pulmonary disease, unspecified: Secondary | ICD-10-CM | POA: Diagnosis not present

## 2021-11-01 DIAGNOSIS — Z515 Encounter for palliative care: Secondary | ICD-10-CM | POA: Diagnosis not present

## 2021-11-01 LAB — CBC
HCT: 38.3 % (ref 36.0–46.0)
Hemoglobin: 11.5 g/dL — ABNORMAL LOW (ref 12.0–15.0)
MCH: 32.3 pg (ref 26.0–34.0)
MCHC: 30 g/dL (ref 30.0–36.0)
MCV: 107.6 fL — ABNORMAL HIGH (ref 80.0–100.0)
Platelets: 177 10*3/uL (ref 150–400)
RBC: 3.56 MIL/uL — ABNORMAL LOW (ref 3.87–5.11)
RDW: 19 % — ABNORMAL HIGH (ref 11.5–15.5)
WBC: 8 10*3/uL (ref 4.0–10.5)
nRBC: 1.8 % — ABNORMAL HIGH (ref 0.0–0.2)

## 2021-11-01 MED ORDER — ALBUMIN HUMAN 25 % IV SOLN
INTRAVENOUS | Status: AC
  Administered 2021-11-01: 25 g
  Filled 2021-11-01: qty 100

## 2021-11-01 NOTE — Progress Notes (Signed)
Bipap is PRN order.  Not needed at this time. 

## 2021-11-01 NOTE — Assessment & Plan Note (Signed)
--   continue skin care protocol  ?

## 2021-11-01 NOTE — Progress Notes (Addendum)
Received patient in bed to unit.  Alert and oriented.  Informed consent signed and in chart.   Treatment initiated: 1010 Treatment completed: 1325  Patient tolerated well.  Transported back to the room  Alert, without acute distress.  Hand-off given to patient's nurse.   Access used: AVF Access issues: none  Total UF removed: 2 L Medication(s) given: Albumin 25 g  IV Post HD VS: 115/80 P 88 R 20  Post HD weight: 77.4 kg    [facility]

## 2021-11-01 NOTE — Evaluation (Signed)
Clinical/Bedside Swallow Evaluation Patient Details  [name] MRN: [id number] Date of Birth: April 03, 1940  Today's Date: 11/01/2021 Time: SLP Start Time (ACUTE ONLY): 1520 SLP Stop Time (ACUTE ONLY): 1545 SLP Time Calculation (min) (ACUTE ONLY): 25 min  Past Medical History:  Past Medical History:  Diagnosis Date   Anemia in chronic kidney disease (CODE) 06/05/2015   Arthritis    "bad in my legs" (12/07/2014)   Chronic atrial fibrillation ([place]) 06/05/2015   Chronic back pain    "dr said my spine is crooked"   Chronic bronchitis ([place])    "get it q yr" (25/85/2778)   Complication of anesthesia    headache for 2 days after surgery in May 2019   COPD (chronic obstructive pulmonary disease) ([place])    ESRD on hemodialysis ([place]) 06/05/2015   Gait difficulty 09/02/2014   Hypercholesterolemia    Hypertension    Neuropathy 2015   in legs   NSVT (nonsustained ventricular tachycardia) ([place])    Sinus brady-tachy syndrome ([place])    Stroke ([place]) 05/2014   denies residual on 12/07/2014   Type II diabetes mellitus [facility])    Past Surgical History:  Past Surgical History:  Procedure Laterality Date   ABDOMINAL AORTOGRAM W/LOWER EXTREMITY Left 04/06/2021   Procedure: ABDOMINAL AORTOGRAM W/LOWER EXTREMITY;  Surgeon: Broadus John, MD;  Location: [place] [facility];  Service: Cardiovascular;  Laterality: Left;   ABDOMINAL HYSTERECTOMY  ~ 1970   AMPUTATION TOE Left 04/08/2021   Procedure: FIRST TOE LEFT FOOT AMPUTATION;  Surgeon: Trula Slade, DPM;  Location: [place];  Service: Podiatry;  Laterality: Left;   APPENDECTOMY  ~ [place] Right 07/06/2017   Procedure: RIGHT RADIOCEPHALIC ARTERIOVENOUS FISTULA creation;  Surgeon: Conrad Medora, MD;  Location: [place];  Service: Vascular;  Laterality: Right;   [place] Right 09/17/2017   Procedure: SECOND STAGE BASILIC VEIN TRANSPOSITION RIGHT ARM;  Surgeon: Rosetta Posner, MD;  Location: [place];  Service:  Vascular;  Laterality: Right;   COLONOSCOPY N/A 10/28/2017   Procedure: COLONOSCOPY;  Surgeon: Rogene [place], MD;  [facility];  Service: Endoscopy;  Laterality: N/A;   COLONOSCOPY WITH PROPOFOL N/A 12/29/2019   Procedure: COLONOSCOPY WITH PROPOFOL;  Surgeon: Eloise Harman, DO;  [facility];  Service: Endoscopy;  Laterality: N/A;   ESOPHAGOGASTRODUODENOSCOPY (EGD) WITH PROPOFOL N/A 10/31/2017   Procedure: ESOPHAGOGASTRODUODENOSCOPY (EGD) WITH PROPOFOL;  Surgeon: Rogene [place], MD;  [facility];  Service: Endoscopy;  Laterality: N/A;   JOINT REPLACEMENT     PERIPHERAL VASCULAR BALLOON ANGIOPLASTY Left 04/06/2021   Procedure: PERIPHERAL VASCULAR BALLOON ANGIOPLASTY;  Surgeon: Broadus John, MD;  Location: [place] [facility];  Service: Cardiovascular;  Laterality: Left;  left sfa succesful [place] AT Unsuccesful   TOTAL KNEE ARTHROPLASTY Right ~ [place]  ~ 1970   "9# in my stomach"   HPI:  81 y.o. female with medical history significant of ESRD, chronic atrial fibrillation, COPD, hyperlipidemia, hypertension, diet-controlled diabetes mellitus type 2, and more presents to the ED with a chief complaint of dyspnea.  They checked her oxygen on her baseline 3 L nasal cannula and it was 60%.  She came into the ER on nonrebreather.  Work-up revealed a likely pneumonia and she was started on Rocephin and Zithromax.  Patient slowly became more somnolent while she was in the ER. Patient is DNR.  BiPAP was started and she was in stepdown  for a period of time; She appears clinically fluid overloaded. BSE requested d/t difficulty with swallowing her pills.    Assessment / Plan / Recommendation  Clinical Impression  Clinical swallowing evaluation completed while Pt was sitting upright in bed; note hoarse vocal quality which has also been previously reported in recent BSE in April of this year. Pt denies dysphagia but does report difficulty swallowing pills.  Pt accepted thin liquids and regular textures and consumed all without overt s/sx of aspiration. She did demonstrate prolonged oral prep and prolonged AP transit with regular textures, however, she reports that she prefers soft foods. Recommend downgrade Pt's diet to D2/fine chop and continue with thin liquids. Pt prefers meds to be crushed in puree and recommend pills that cannot be crushed be administered whole with puree or whole with pudding/yogurt. There are no further ST needs noted at this time, ST will sign off. Thank you, SLP Visit Diagnosis: Dysphagia, unspecified (R13.10)    Aspiration Risk       Diet Recommendation Dysphagia 2 (Fine chop);Thin liquid   Liquid Administration via: Cup;Straw Medication Administration: Crushed with puree Supervision: Patient able to self feed Compensations: Minimize environmental distractions;Slow rate;Small sips/bites    Other  Recommendations Oral Care Recommendations: Oral care BID    Recommendations for follow up therapy are one component of a multi-disciplinary discharge planning process, led by the attending physician.  Recommendations may be updated based on patient status, additional functional criteria and insurance authorization.  Follow up Recommendations No SLP follow up        Swallow Study   General HPI: 81 y.o. female with medical history significant of ESRD, chronic atrial fibrillation, COPD, hyperlipidemia, hypertension, diet-controlled diabetes mellitus type 2, and more presents to the ED with a chief complaint of dyspnea.  They checked her oxygen on her baseline 3 L nasal cannula and it was 60%.  She came into the ER on nonrebreather.  Work-up revealed a likely pneumonia and she was started on Rocephin and Zithromax.  Patient slowly became more somnolent while she was in the ER. Patient is DNR.  BiPAP was started and she was in stepdown for a period of time; She appears clinically fluid overloaded. BSE requested d/t difficulty with  swallowing her pills. Type of Study: Bedside Swallow Evaluation Previous Swallow Assessment: multiple BSE in chart review; most recent April of 2023 Diet Prior to this Study: Thin liquids;Regular Temperature Spikes Noted: No Respiratory Status: Nasal cannula History of Recent Intubation: No Behavior/Cognition: Alert;Cooperative;Pleasant mood Oral Cavity Assessment: Within Functional Limits Oral Care Completed by SLP: Recent completion by staff Vision: Functional for self-feeding Self-Feeding Abilities: Able to feed self Patient Positioning: Upright in bed Baseline Vocal Quality: Normal Volitional Cough: Strong Volitional Swallow: Able to elicit    Oral/Motor/Sensory Function Overall Oral Motor/Sensory Function: Within functional limits   Ice Chips Ice chips: Within functional limits   Thin Liquid Thin Liquid: Within functional limits    Nectar Thick Nectar Thick Liquid: Not tested   Honey Thick Honey Thick Liquid: Not tested   Puree Puree: Within functional limits   Solid     Solid: Within functional limits     Larene Ascencio H. Roddie Mc, CCC-SLP Speech Language Pathologist  Wende Bushy 11/01/2021,4:39 PM

## 2021-11-01 NOTE — Progress Notes (Signed)
Palliative: First attempt to see Jody [name] she was off the floor in hemodialysis.  I return later in the day to find Jody [name] has completed dialysis and has returned to her room.  Jody [name] is resting comfortably in bed, but wakes easily when I call her name and touch her shoulder.  She is alert, oriented, able to make her needs known.  There is no family present at bedside at this time.  I asked how she did with hemodialysis and she states that she did well.  She tells me that the doctor saw her in the HD suite and told her that she could return to her long-term care facility today.  She tells me that she feels that she is ready to return.  We talk about outpatient palliative services, a nurse practitioner once a month.  She is agreeable to outpatient palliative services.  Provider of choice, [facility].  Conference with attending, bedside nursing staff, transition of care team related to patient condition, needs, goals of care, disposition.  Plan:   At this point continue to treat the treatable but no CPR or intubation.  Continue hemodialysis as long as tolerated.  Short-term rehab at [facility] or where she is a long-term care resident.    42 minutes Quinn Axe, NP Palliative medicine team Team phone [phone] Greater than 50% of this time was spent counseling and coordinating care related to the above assessment and plan.

## 2021-11-01 NOTE — Progress Notes (Signed)
   11/01/21 0922  Assess: MEWS Score  Temp 97.8 F (36.6 C)  BP 95/72  MAP (mmHg) 81  Pulse Rate (!) 42  Assess: MEWS Score  MEWS Temp 0  MEWS Systolic 1  MEWS Pulse 1  MEWS RR 0  MEWS LOC 0  MEWS Score 2  MEWS Score Color Yellow  Assess: if the MEWS score is Yellow or Red  Were vital signs taken at a resting state? Yes  Focused Assessment No change from prior assessment  Does the patient meet 2 or more of the SIRS criteria? No  MEWS guidelines implemented *See Row Information* Yes  Notify: Provider  Provider Name/Title MD Wynetta Emery  Date Provider Notified 11/01/21  Time Provider Notified [phone]  Provider response Other (Comment)  Date of Provider Response 11/01/21  Time of Provider Response 0959  Assess: SIRS CRITERIA  SIRS Temperature  0  SIRS Pulse 0  SIRS Respirations  0  SIRS WBC 1  SIRS Score Sum  1

## 2021-11-01 NOTE — Procedures (Signed)
I was present at this dialysis session. I have reviewed the session itself and made appropriate changes.   Vital signs in last 24 hours:  Temp:  [97.4 F (36.3 C)-97.8 F (36.6 C)] 97.6 F (36.4 C) (09/19 1010) Pulse Rate:  [42-78] 62 (09/19 1015) Resp:  [20] 20 (09/19 1015) BP: (89-115)/(58-72) 98/58 (09/19 1015) SpO2:  [95 %-99 %] 96 % (09/19 1010) Weight change:  Filed Weights   10/27/21 1828 10/29/21 1050 10/29/21 1439  Weight: 79.5 kg 81.5 kg 79.3 kg    Recent Labs  Lab 10/30/21 0512  NA 134*  K 3.3*  CL 92*  CO2 25  GLUCOSE 191*  BUN 45*  CREATININE 2.81*  CALCIUM 9.3  PHOS 4.2    Recent Labs  Lab 10/28/21 0502 10/29/21 1056 10/30/21 0512 11/01/21 0500  WBC 18.7* 10.0 10.8* 8.0  NEUTROABS 16.7*  --   --   --   HGB 12.4 11.2* 11.8* 11.5*  HCT 42.4 37.3 40.2 38.3  MCV 110.1* 105.1* 108.6* 107.6*  PLT 143* 181 172 177    Scheduled Meds:  apixaban  2.5 mg Oral BID   calcitRIOL  0.5 mcg Oral Q T,Th,Sat-1800   Chlorhexidine Gluconate Cloth  6 each Topical Daily   Chlorhexidine Gluconate Cloth  6 each Topical Q0600   cinacalcet  30 mg Oral Q T,Th,Sat-1800   doxycycline  100 mg Oral Q12H   feeding supplement (NEPRO CARB STEADY)  237 mL Oral BID BM   folic acid  1 mg Oral Daily   gabapentin  300 mg Oral Daily   ipratropium-albuterol  3 mL Nebulization BID   metoprolol succinate  12.5 mg Oral Daily   midodrine  15 mg Oral TID WC   nystatin  5 mL Oral QID   pravastatin  40 mg Oral q1800   sevelamer carbonate  800 mg Oral TID WC   Continuous Infusions:  albumin human     PRN Meds:.acetaminophen **OR** acetaminophen, albumin human, albuterol, fentaNYL (SUBLIMAZE) injection, ondansetron **OR** ondansetron (ZOFRAN) IV, oxyCODONE, phenol   Donetta Potts,  MD 11/01/2021, 10:25 AM

## 2021-11-01 NOTE — Progress Notes (Addendum)
PROGRESS NOTE   Jody [name]  FIE:[id number] DOB: [birth year] DOA: 10/27/2021 PCP: Monico Blitz, MD   Chief Complaint  Patient presents with   Shortness of Breath   Level of care: Med-Surg  Brief Admission History:  81 y.o. female with medical history significant of ESRD, chronic atrial fibrillation, COPD, hyperlipidemia, hypertension, diet-controlled diabetes mellitus type 2, and more presents to the ED with a chief complaint of dyspnea.  Unfortunately patient is too altered at the time of my exam to give any history.  She was more alert on first presentation to the ER.  She had told the ER staff that she started to have shortness of breath when dialysis finished.  They checked her oxygen on her baseline 3 L nasal cannula and it was 60%.  She came into the ER on nonrebreather.  Work-up revealed a likely pneumonia and she was started on Rocephin and Zithromax.  Patient slowly became more somnolent while she was in the ER.  When I saw her she would not speak, but would look in the eye, and open her eyes to voice.  Patient is DNR.  BiPAP was started.  ABG showed a hypercarbia.  Will admit to stepdown on BiPAP.  Patient does get dialysis Tuesday, Thursday, Saturday.  She appears clinically fluid overloaded.  Patient is an uric so Lasix is not prescribed.  Patient does have an elevated but stable troponin that is likely demand ischemia in the setting of hypoxia.  Patient is not able to discuss any complaints at this time.   Patient has DNR form at bedside.   It is noted the patient was recently discharged 4 days PTA.  At that admission patient was also volume overloaded.  Her dialysis sessions have been cut short due to leg pain.  She did have a cough productive of white sputum at that time.  She was hypoxic as well with her oxygen sats down to 70s on her baseline 3 L nasal cannula.  Her troponins were lower at that time and 50, 24 during that hospitalization patient continued to have episodes of  shortness of breath on her nondialysis days.  This was thought to be multifactorial with COPD, anxiety, and hypervolemia all contributing.  Respiratory reports the patient was calling out every hour for breathing treatment, and they believed it to be anxiety related.  They do note that her somnolence now is a change from her baseline.   Assessment and Plan: * Acute respiratory failure with hypoxia ([place]) - Patient noted to be down to 60% oxygen saturation on baseline O2 3 L - Presented with nonrebreather - Related to COPD, fluid overload and Community-acquired pneumonia - Pt was treated with BiPAP - Patient has HD on Tuesday, Thursday, Saturday at baseline, consulted nephro for fluid management -Weaned off BiPAP and now on [place] supplemental oxygen - transfer to med surg DC cardiac monitoring  Dysphagia --staff reporting difficulty with swallowing, choking spells, will get SLP evaluation ordered 11/01/21  Elevated troponin - Elevated to 101, 106 - Continue aspirin and statin as well as beta-blocker - No acute ischemic changes on EKG - Likely demand ischemia in the setting of hypoxia, do not suspect ACS  CAP (community acquired pneumonia) - Chest x-ray shows pneumonia difficult to exclude, leukocytosis of 20.8, respiratory failure - Procalcitonin 8.46 - Rocephin and Zithromax started in the ED - Initially treated with Rocephin and Zithromax, transitioned to oral doxycycline 9/18 - Sputum culture, urine antigens for Legionella and strep - Unfortunately blood cultures were  not done before antibiotics, patient states of fever we will do blood cultures at that time - COVID and flu negative - Continue to monitor  Persistent atrial fibrillation ([place]) - Continue metoprolol - Continue Eliquis - Continue to monitor  Acute metabolic encephalopathy - resolved with treatments  - Multifactorial with pneumonia and respiratory failure with hypoxia and hypercapnia  Pressure injury of skin --  continue skin care protocol   ESRD on dialysis [facility]) - Tuesday Thursday Saturday dialysis - Patient is clinically fluid overloaded - BNP is 4400 - Consult nephro for fluid management - Continue Renvela - Continue renal diet with fluid restrictions - Continue to monitor; nephrology planned HD for 9/16, pt tolerated well, plan for HD 9/19  COPD (chronic obstructive pulmonary disease) ([place]) - Continue albuterol as needed - Scheduled DuoNebs - Continue BiPAP - Recently finished a course of 50 mg prednisone - No wheezing - Continue to monitor  CHF exacerbation ([facility]) - Cardiorenal syndrome, fluid overload, HD patient - BNP 4400, troponin 101, 106, continue cycle troponin -EKG is without acute ischemic changes - Continue aspirin, statin, beta-blocker - Last echo was done earlier this month and showed an ejection fraction of 40-45% with global hypokinesis diastolic function could not be evaluated  Hyperlipidemia - Continue pravastatin  DVT prophylaxis: apixaban Code Status: DNR  Family Communication:  Disposition: Status is: Inpatient   Consultants:  Nephrology  Palliative care  Procedures:  Hemodialysis 9/16,9/19 Antimicrobials:  Ceftriaxone/ azithromycin 9/15>>9/18 Doxycycline 9/18>> Subjective: Pt having some difficulty swallowing.   Objective: Vitals:   11/01/21 1033 11/01/21 1100 11/01/21 1130 11/01/21 1200  BP:  98/60 104/60 103/61  Pulse:  90 98 92  Resp:  (!) 24 (!) 21 (!) 21  Temp:      TempSrc:      SpO2:  98% 98% 98%  Weight: 79 kg     Height:        Intake/Output Summary (Last 24 hours) at 11/01/2021 1212 Last data filed at 11/01/2021 0845 Gross per 24 hour  Intake 240 ml  Output --  Net 240 ml   Filed Weights   10/29/21 1050 10/29/21 1439 11/01/21 1033  Weight: 81.5 kg 79.3 kg 79 kg   Examination:  General exam: frail, elderly, chronically ill appearing female, awake, alert, cooperative, Appears calm and comfortable,  pronounced hoarseness to  voice.  Respiratory system: clear to auscultation. Respiratory effort normal. Cardiovascular system: normal S1 & S2 heard. No JVD, murmurs, rubs, gallops or clicks. No pedal edema. Gastrointestinal system: Abdomen is nondistended, soft and nontender. No organomegaly or masses felt. Normal bowel sounds heard. Central nervous system: Alert and oriented. No focal neurological deficits. Extremities: Symmetric 5 x 5 power. Skin: No rashes, lesions or ulcers. Psychiatry: Judgement and insight appear normal. Mood & affect appropriate.   Data Reviewed: I have personally reviewed following labs and imaging studies  CBC: Recent Labs  Lab 10/27/21 2054 10/28/21 0502 10/29/21 1056 10/30/21 0512 11/01/21 0500  WBC 20.8* 18.7* 10.0 10.8* 8.0  NEUTROABS  --  16.7*  --   --   --   HGB 12.9 12.4 11.2* 11.8* 11.5*  HCT 43.2 42.4 37.3 40.2 38.3  MCV 108.5* 110.1* 105.1* 108.6* 107.6*  PLT 107* 143* 181 172 160    Basic Metabolic Panel: Recent Labs  Lab 10/27/21 2054 10/28/21 0502 10/29/21 1056 10/30/21 0512  NA 134* 135 133* 134*  K 4.1 4.7 3.9 3.3*  CL 93* 96* 95* 92*  CO2 '22 24 23 25  '$ GLUCOSE  176* 160* 170* 191*  BUN 43* 48* 66* 45*  CREATININE 2.76* 2.91* 3.84* 2.81*  CALCIUM 9.7 9.5 9.5 9.3  MG  --  2.7*  --   --   PHOS  --   --  5.4* 4.2    CBG: Recent Labs  Lab 10/28/21 0746  GLUCAP 155*    Recent Results (from the past 240 hour(s))  MRSA Next Gen by PCR, Nasal     Status: None   Collection Time: 10/22/21  4:30 PM   Specimen: Nasal Mucosa; Nasal Swab  Result Value Ref Range Status   MRSA by PCR Next Gen NOT DETECTED NOT DETECTED Final    Comment: (NOTE) The GeneXpert MRSA Assay (FDA approved for NASAL specimens only), is one component of a comprehensive MRSA colonization surveillance program. It is not intended to diagnose MRSA infection nor to guide or monitor treatment for MRSA infections. Test performance is not FDA approved in patients less than 66  years old. Performed at [facility], [address], [place], [place] [address]   Resp Panel by RT-PCR (Flu A&B, Covid) Anterior Nasal Swab     Status: None   Collection Time: 10/27/21  6:39 PM   Specimen: Anterior Nasal Swab  Result Value Ref Range Status   SARS Coronavirus 2 by RT PCR NEGATIVE NEGATIVE Final    Comment: (NOTE) SARS-CoV-2 target nucleic acids are NOT DETECTED.  The SARS-CoV-2 RNA is generally detectable in upper respiratory specimens during the acute phase of infection. The lowest concentration of SARS-CoV-2 viral copies this assay can detect is 138 copies/mL. A negative result does not preclude SARS-Cov-2 infection and should not be used as the sole basis for treatment or other patient management decisions. A negative result may occur with  improper specimen collection/handling, submission of specimen other than nasopharyngeal swab, presence of viral mutation(s) within the areas targeted by this assay, and inadequate number of viral copies(<138 copies/mL). A negative result must be combined with clinical observations, patient history, and epidemiological information. The expected result is Negative.  Fact Sheet for Patients:  EntrepreneurPulse.com.au  Fact Sheet for Healthcare Providers:  IncredibleEmployment.be  This test is no t yet approved or cleared by the [place] FDA and  has been authorized for detection and/or diagnosis of SARS-CoV-2 by FDA under an Emergency Use Authorization (EUA). This EUA will remain  in effect (meaning this test can be used) for the duration of the COVID-19 declaration under Section 564(b)(1) of the Act, 21 U.S.C.section 360bbb-3(b)(1), unless the authorization is terminated  or revoked sooner.       Influenza A by PCR NEGATIVE NEGATIVE Final   Influenza B by PCR NEGATIVE NEGATIVE Final    Comment: (NOTE) The Xpert Xpress SARS-CoV-2/FLU/RSV plus assay is intended as an aid in the  diagnosis of influenza from Nasopharyngeal swab specimens and should not be used as a sole basis for treatment. Nasal washings and aspirates are unacceptable for Xpert Xpress SARS-CoV-2/FLU/RSV testing.  Fact Sheet for Patients: EntrepreneurPulse.com.au  Fact Sheet for Healthcare Providers: IncredibleEmployment.be  This test is not yet approved or cleared by the [place] FDA and has been authorized for detection and/or diagnosis of SARS-CoV-2 by FDA under an Emergency Use Authorization (EUA). This EUA will remain in effect (meaning this test can be used) for the duration of the COVID-19 declaration under Section 564(b)(1) of the Act, 21 U.S.C. section 360bbb-3(b)(1), unless the authorization is terminated or revoked.  Performed at [facility], [address] [place], [place] [address]  C Difficile Quick Screen w PCR reflex     Status: Abnormal   Collection Time: 10/29/21  3:41 PM   Specimen: STOOL  Result Value Ref Range Status   C Diff antigen POSITIVE (A) NEGATIVE Final   C Diff toxin NEGATIVE NEGATIVE Final   C Diff interpretation Results are indeterminate. See PCR results.  Final    Comment: Performed at [facility], [address] [place], [place] [address]  C. Diff by PCR, Reflexed     Status: None   Collection Time: 10/29/21  3:41 PM  Result Value Ref Range Status   Toxigenic C. Difficile by PCR NEGATIVE NEGATIVE Final    Comment: Patient is colonized with non toxigenic C. difficile. May not need treatment unless significant symptoms are present. Performed at [facility], [place] [address], [place], [place] [address]      Radiology Studies: No results found.  Scheduled Meds:  apixaban  2.5 mg Oral BID   calcitRIOL  0.5 mcg Oral Q T,Th,Sat-1800   Chlorhexidine Gluconate Cloth  6 each Topical Daily   Chlorhexidine Gluconate Cloth  6 each Topical Q0600   cinacalcet  30 mg Oral Q T,Th,Sat-1800   doxycycline  100 mg Oral  Q12H   feeding supplement (NEPRO CARB STEADY)  237 mL Oral BID BM   folic acid  1 mg Oral Daily   gabapentin  300 mg Oral Daily   ipratropium-albuterol  3 mL Nebulization BID   metoprolol succinate  12.5 mg Oral Daily   midodrine  15 mg Oral TID WC   nystatin  5 mL Oral QID   pravastatin  40 mg Oral q1800   sevelamer carbonate  800 mg Oral TID WC   Continuous Infusions:  LOS: 5 days   Time spent: 35 mins  Kayin Osment Wynetta Emery, MD How to contact the [facility] Attending or Consulting provider [place] or covering provider during after hours [place], for this patient?  Check the care team in [facility] and look for a) attending/consulting [facility] provider listed and b) the [facility] team listed Log into [email or web address] and use [place]'s universal password to access. If you do not have the password, please contact the hospital operator. Locate the [facility] provider you are looking for under [facility] and page to a number that you can be directly reached. If you still have difficulty reaching the provider, please page the [facility] (Director on Call) for the Hospitalists listed on amion for assistance.  11/01/2021, 12:12 PM

## 2021-11-01 NOTE — Assessment & Plan Note (Signed)
--  staff reporting difficulty with swallowing, choking spells, will get SLP evaluation ordered 11/01/21

## 2021-11-02 ENCOUNTER — Other Ambulatory Visit: Payer: Self-pay

## 2021-11-02 ENCOUNTER — Inpatient Hospital Stay (HOSPITAL_COMMUNITY): Payer: Medicare Other

## 2021-11-02 DIAGNOSIS — J9601 Acute respiratory failure with hypoxia: Secondary | ICD-10-CM | POA: Diagnosis not present

## 2021-11-02 DIAGNOSIS — J189 Pneumonia, unspecified organism: Secondary | ICD-10-CM | POA: Diagnosis not present

## 2021-11-02 DIAGNOSIS — G9341 Metabolic encephalopathy: Secondary | ICD-10-CM | POA: Diagnosis not present

## 2021-11-02 DIAGNOSIS — N186 End stage renal disease: Secondary | ICD-10-CM | POA: Diagnosis not present

## 2021-11-02 LAB — BLOOD GAS, VENOUS
Acid-Base Excess: 4.2 mmol/L — ABNORMAL HIGH (ref 0.0–2.0)
Bicarbonate: 32.5 mmol/L — ABNORMAL HIGH (ref 20.0–28.0)
Drawn by: 6892
FIO2: 21 %
O2 Saturation: 32.3 %
Patient temperature: 36.5
pCO2, Ven: 65 mmHg — ABNORMAL HIGH (ref 44–60)
pH, Ven: 7.31 (ref 7.25–7.43)
pO2, Ven: <31 mmHg — CL (ref 32–45)

## 2021-11-02 LAB — PROCALCITONIN: Procalcitonin: 6.08 ng/mL

## 2021-11-02 MED ORDER — MIDODRINE HCL 5 MG PO TABS: 15.0000 mg | ORAL_TABLET | Freq: Three times a day (TID) | ORAL | Status: AC

## 2021-11-02 MED ORDER — CEFDINIR 300 MG PO CAPS: 300.0000 mg | ORAL_CAPSULE | Freq: Every day | ORAL | Status: AC

## 2021-11-02 MED ORDER — METOPROLOL SUCCINATE ER 25 MG PO TB24: 12.5000 mg | ORAL_TABLET | Freq: Every day | ORAL | Status: AC

## 2021-11-02 MED ORDER — ALPRAZOLAM 0.25 MG PO TABS: 0.2500 mg | ORAL_TABLET | Freq: Three times a day (TID) | ORAL | 0 refills | Status: AC | PRN

## 2021-11-02 MED ORDER — ALPRAZOLAM 0.25 MG PO TABS
0.2500 mg | ORAL_TABLET | Freq: Two times a day (BID) | ORAL | Status: DC | PRN
  Administered 2021-11-02: 0.25 mg via ORAL
  Filled 2021-11-02: qty 1

## 2021-11-02 MED ORDER — CEFDINIR 300 MG PO CAPS
300.0000 mg | ORAL_CAPSULE | Freq: Every day | ORAL | Status: DC
  Administered 2021-11-02 – 2021-11-03 (×2): 300 mg via ORAL
  Filled 2021-11-02 (×2): qty 1

## 2021-11-02 MED ORDER — METHYLPREDNISOLONE SODIUM SUCC 125 MG IJ SOLR
60.0000 mg | Freq: Two times a day (BID) | INTRAMUSCULAR | Status: DC
  Administered 2021-11-02 – 2021-11-03 (×3): 60 mg via INTRAVENOUS
  Filled 2021-11-02 (×3): qty 2

## 2021-11-02 MED ORDER — DOXYCYCLINE HYCLATE 100 MG PO TABS: 100.0000 mg | ORAL_TABLET | Freq: Two times a day (BID) | ORAL | Status: DC

## 2021-11-02 NOTE — Progress Notes (Signed)
Report called to [place] at [facility]. Ems has been called will remove patient IV and get her ready for transport

## 2021-11-02 NOTE — Care Management Important Message (Signed)
Important Message  Patient Details  [name] MRN: [id number] Date of Birth: December 25, 1940   Medicare Important Message Given:  Yes (copy delivered to room with family)     [name] 11/02/2021, 4:18 PM

## 2021-11-02 NOTE — Progress Notes (Signed)
Date and time results received: 11/02/21 1438 (use smartphrase ".now" to insert current time)  Test: abg Critical Value: Less than 31  Name of Provider Notified: Dr, Tat  Orders Received? Or Actions Taken?: No new orders at this time

## 2021-11-02 NOTE — Progress Notes (Signed)
Patient c/o being SOB her o2 sat is 99% on 5L , and her pulse is 68. Patient appears anxious , family at bedside, Notified Dr. Carles Collet , patient is not tachypnic at this time her RR is 22   11/02/21 1800  Vitals  Pulse Rate 68  Pulse Rate Source Dinamap  MEWS COLOR  MEWS Score Color Green  Oxygen Therapy  SpO2 99 %  O2 Flow Rate (L/min) 5 L/min  MEWS Score  MEWS Temp 0  MEWS Systolic 0  MEWS Pulse 0  MEWS RR 0  MEWS LOC 0  MEWS Score 0

## 2021-11-02 NOTE — TOC Transition Note (Signed)
Transition of Care [facility]) - CM/SW Discharge Note   Patient Details  [name] MRN: [id number] Date of Birth: [birth year]  Transition of Care [facility]) CM/SW Contact:  Shade Flood, LCSW Phone Number: 11/02/2021, 11:13 AM   Clinical Narrative:     Pt stable for dc back to [facility] today per MD. Updated Threasa Beards at [facility] and they can admit pt today. Updated pt's niece, Precious Bard who will update [name].   DC clinical sent electronically. RN to call report. EMS arranged. There are no other TOC needs for dc.  Final next level of care: Long Term Nursing Home Barriers to Discharge: Barriers Resolved   Patient Goals and CMS Choice Patient states their goals for this hospitalization and ongoing recovery are:: return to facility CMS Medicare.gov Compare Post Acute Care list provided to:: Patient Choice offered to / list presented to : Patient  Discharge Placement                       Discharge Plan and Services In-house Referral: Clinical Social Work   Post Acute Care Choice: Resumption of Svcs/PTA Provider                               Social Determinants of Health (SDOH) Interventions     Readmission Risk Interventions    08/15/2021   11:49 AM 06/07/2021    2:47 PM 06/01/2021   10:59 AM  Readmission Risk Prevention Plan  Transportation Screening Complete Complete Complete  Medication Review [employer])  Complete Complete  PCP or Specialist appointment within 3-5 days of discharge  Complete   HRI or Home Care Consult Complete Complete Complete  SW Recovery Care/Counseling Consult Complete Complete Complete  Palliative Care Screening Not Applicable Complete Not Applicable  Skilled Nursing Facility Complete Complete Complete

## 2021-11-02 NOTE — Progress Notes (Signed)
Patient became hypoxic when transferring from bed to stretcher . Dr. Carles Collet ordered CXR patient o2 was 68% placed on 10 L nonrebreather , patient o2 cam e up to 98% and then she was able to deep breathe. She is on 3L at this time. Discharge canceled for now

## 2021-11-02 NOTE — Care Management Important Message (Signed)
Important Message  Patient Details  [name] MRN: [id number] Date of Birth: 09/16/1940   Medicare Important Message Given:  Yes (attempted to contact neice Patti at [phone], [employer] at [phone], na, copy mailed to address on file)     [name] 11/02/2021, 12:23 PM

## 2021-11-02 NOTE — Progress Notes (Signed)
Patient ID: Dudley Cooley, female   DOB: 1940/02/21, 81 y.o.   MRN: [id number] S: Feeling better.  Plans for discharge to SNF today per Primary. O:BP 110/86 (BP Location: Left Arm)   Pulse 72   Temp (!) 97.5 F (36.4 C)   Resp 18   Ht '5\' 7"'$  (1.702 m)   Wt 77.4 kg   SpO2 94%   BMI 26.73 kg/m   Intake/Output Summary (Last 24 hours) at 11/02/2021 1158 Last data filed at 11/01/2021 1700 Gross per 24 hour  Intake 100 ml  Output 2000 ml  Net -1900 ml   Intake/Output: I/O last 3 completed shifts: In: 340 [P.O.:290; IV Piggyback:50] Out: 2000 [Other:2000]  Intake/Output this shift:  No intake/output data recorded. Weight change:  ION:GEXBM, NAD CVS: RRR Resp: CTA Abd: +BS, soft, NT/ND Ext: trace presacral edema, RUE AVF +T/B  Recent Labs  Lab 10/27/21 2054 10/28/21 0502 10/29/21 1056 10/30/21 0512  NA 134* 135 133* 134*  K 4.1 4.7 3.9 3.3*  CL 93* 96* 95* 92*  CO2 '22 24 23 25  '$ GLUCOSE 176* 160* 170* 191*  BUN 43* 48* 66* 45*  CREATININE 2.76* 2.91* 3.84* 2.81*  ALBUMIN 4.1 3.9 3.6 4.0  CALCIUM 9.7 9.5 9.5 9.3  PHOS  --   --  5.4* 4.2  AST 29 133*  --   --   ALT 32 163*  --   --    Liver Function Tests: Recent Labs  Lab 10/27/21 2054 10/28/21 0502 10/29/21 1056 10/30/21 0512  AST 29 133*  --   --   ALT 32 163*  --   --   ALKPHOS 109 111  --   --   BILITOT 1.3* 1.1  --   --   PROT 7.3 7.0  --   --   ALBUMIN 4.1 3.9 3.6 4.0   No results for input(s): "LIPASE", "AMYLASE" in the last 168 hours. No results for input(s): "AMMONIA" in the last 168 hours. CBC: Recent Labs  Lab 10/27/21 2054 10/28/21 0502 10/29/21 1056 10/30/21 0512 11/01/21 0500  WBC 20.8* 18.7* 10.0 10.8* 8.0  NEUTROABS  --  16.7*  --   --   --   HGB 12.9 12.4 11.2* 11.8* 11.5*  HCT 43.2 42.4 37.3 40.2 38.3  MCV 108.5* 110.1* 105.1* 108.6* 107.6*  PLT 107* 143* 181 172 177   Cardiac Enzymes: No results for input(s): "CKTOTAL", "CKMB", "CKMBINDEX", "TROPONINI" in the last 168  hours. CBG: Recent Labs  Lab 10/28/21 0746  GLUCAP 155*    Iron Studies: No results for input(s): "IRON", "TIBC", "TRANSFERRIN", "FERRITIN" in the last 72 hours. Studies/Results: No results found.  apixaban  2.5 mg Oral BID   calcitRIOL  0.5 mcg Oral Q T,Th,Sat-1800   cefdinir  300 mg Oral Daily   Chlorhexidine Gluconate Cloth  6 each Topical Daily   Chlorhexidine Gluconate Cloth  6 each Topical Q0600   cinacalcet  30 mg Oral Q T,Th,Sat-1800   doxycycline  100 mg Oral Q12H   feeding supplement (NEPRO CARB STEADY)  237 mL Oral BID BM   folic acid  1 mg Oral Daily   gabapentin  300 mg Oral Daily   ipratropium-albuterol  3 mL Nebulization BID   metoprolol succinate  12.5 mg Oral Daily   midodrine  15 mg Oral TID WC   nystatin  5 mL Oral QID   pravastatin  40 mg Oral q1800   sevelamer carbonate  800 mg Oral TID WC  BMET    Component Value Date/Time   NA 134 (L) 10/30/2021 0512   K 3.3 (L) 10/30/2021 0512   CL 92 (L) 10/30/2021 0512   CO2 25 10/30/2021 0512   GLUCOSE 191 (H) 10/30/2021 0512   BUN 45 (H) 10/30/2021 0512   CREATININE 2.81 (H) 10/30/2021 0512   CALCIUM 9.3 10/30/2021 0512   GFRNONAA 16 (L) 10/30/2021 0512   GFRAA 9 (L) 10/27/2019 0112   CBC    Component Value Date/Time   WBC 8.0 11/01/2021 0500   RBC 3.56 (L) 11/01/2021 0500   HGB 11.5 (L) 11/01/2021 0500   HCT 38.3 11/01/2021 0500   PLT 177 11/01/2021 0500   MCV 107.6 (H) 11/01/2021 0500   MCH 32.3 11/01/2021 0500   MCHC 30.0 11/01/2021 0500   RDW 19.0 (H) 11/01/2021 0500   LYMPHSABS 0.6 (L) 10/28/2021 0502   MONOABS 1.3 (H) 10/28/2021 0502   EOSABS 0.0 10/28/2021 0502   BASOSABS 0.0 10/28/2021 0502    OP HD orders:  [facility], TTS, 3 hours 15 minutes.  EDW 81 kg.  2K/2.5 calcium.  RUE AVF 400/500.  Meds: Mircera 200 mcg every 2 weeks, Venofer 50 mg q, calcitriol 0.5 mcg 3 times a week, Sensipar 30 mg 3 times a week.  No heparin (history of GI bleed)   Assessment/Plan:   SOB - presumably  due to HCAP.  Improved with oxygen, antibiotics and HD. ESRD - will continue with HD on TTS schedule while she remains an inpatient. Hypotension - recent increase of midodrine to 15 mg tid Volume - improved with HD.  Anemia of ESKD - hgb stable no esa Atrial fibrillation - persistent on metoprolol and Eliquis.  FTT -  she has had multiple admissions (monthly) and lives in [place].  Currently DNR.  Will need outpatient palliative care. Disposition - for discharge to [facility] today.    Donetta Potts, MD [facility]

## 2021-11-02 NOTE — Progress Notes (Addendum)
Responded to nursing call:   During transfer to gurney by EMS, patient had resp distress with sob and possible hypoxia and bradycardia  Subjective: Pt denies n/v, cp but complains of sob.  Vitals:   11/01/21 2010 11/01/21 2236 11/02/21 0455 11/02/21 0754  BP:  101/64 110/86   Pulse:  91 (!) 55 72  Resp:  '20 20 18  '$ Temp:  97.7 F (36.5 C) (!) 97.5 F (36.4 C)   TempSrc:  Oral    SpO2: 98% 96% 94%   Weight:      Height:       CV--RRR Lung--scattered bilateral rales.  Bilateral wheeze Abd--soft+BS/NT   Assessment/Plan: Respiratory distress -multifactorial including afib, pneumonia, COPD in setting of underlying deconditioned state -very poor waveform on pulse and dynamap>>unclear oxygen sat and HR although pt does not sound brady on auscultation -ABG -CXR -EKG -start solumedrol -cancel discharge     Orson Eva, DO [facility]

## 2021-11-02 NOTE — Discharge Summary (Signed)
Physician Discharge Summary   Patient: Jody [name] MRN: [id number] DOB: 1940/12/02  Admit date:     10/27/2021  Discharge date: 11/02/21  Discharge Physician: Jody [name]   PCP: Jody Blitz, MD   Recommendations at discharge:   Please follow up with primary care provider within 1-2 weeks  Please repeat BMP and CBC in one week  Hospital Course: 81 year old female with history of stroke, COPD, chronic respiratory failure on 3 L, systolic and diastolic CHF, persistent atrial fibrillation, ESRD, iron deficiency presenting from her nursing facility [facility]) from which she is a permanent resident secondary to shortness of breath.  Unfortunately patient is too altered at time of admission to provide history.  She was more alert on first presentation to the ER.  She had told the ER staff that she started to have shortness of breath when dialysis finished.  They checked her oxygen on her baseline 3 L nasal cannula and it was 60%.  She came into the ER on nonrebreather.  Work-up revealed a likely pneumonia and she was started on Rocephin and Zithromax.  Patient slowly became more somnolent while she was in the ER.  When I saw her she would not speak, but would look in the eye, and open her eyes to voice.  Patient is DNR.  BiPAP was started.  ABG showed a hypercarbia.  Admitted to stepdown on BiPAP.  Patient does get dialysis Tuesday, Thursday, Saturday.  She appears clinically fluid overloaded.  Patient does have an elevated but stable troponin that is likely demand ischemia in the setting of hypoxia.  Patient is not able to discuss any complaints at this time.   Patient has DNR form at bedside.   It is noted the patient was recently discharged 4 days PTA.  At that admission patient was also volume overloaded.  Her dialysis sessions have been cut short due to leg pain.  She did have a cough productive of white sputum at that time.  She was hypoxic as well with her oxygen sats down to 70s on her  baseline 3 L nasal cannula.  Her troponins were lower at that time and 50, 24 during that hospitalization patient continued to have episodes of shortness of breath on her nondialysis days.  This was thought to be multifactorial with COPD, anxiety, and hypervolemia all contributing.  Respiratory reports the patient was calling out every hour for breathing treatment, and they believed it to be anxiety related.  They do note that her somnolence now is a change from her baseline. Patient started on ceftriaxone and azithromycin.  Her WBC improved from 20.8 to 8.0.  She was weaned off BiPAP back to her baseline oxygen.  Her mental status improved back to baseline.    Assessment and Plan: Acute on chronic respiratory failure with hypoxia and hypercapnia (HCC) Personally reviewed CXR--increased interstitial markings Initially on BiPAP Chronic on 3 L --Patient continues to have intermittent episodes of dyspnea and tachypnea>> suspect this is being driven by underlying A-fib with RVR, pneumonia, anxiety, and continued hypervolemic state with -- now back on 3L -- 10/24/21 Echo-EF 40-45%, global HK, RV overload, PASP 57.5  Lobar Pneumonia -initially started on ceftriaxone and azithro -transitioned to cefdinir and doxy>>plan 3 more days   ESRD on dialysis [facility]) Schedule--Tuesday Thursday Saturday.   Reports compliance with her HD sessions. -Resume Renal meds. -Appreciate nephrology -last HD on 11/01/21   Acute on chronic combined systolic and diastolic CHF (congestive heart failure) ([place]) Was above her usual dry  weight - Manage volume with HD -06/03/21 Echo EF 50%, RV overload, severe TR -08/02/20 Echo EF 45-50% HK inferoseptal, inferolateral, Inf wall; mod TR -personally reviewed CXR 4/21--increased interstitial markings Ultrafiltration 1.7 L on 10/20/2021 dialysis 10/22/2021>>3.2L -- 9/10 limited echo-- 10/24/21 Echo-EF 40-45%, global HK, RV overload, PASP 57.5 --start metoprolol succinate  Persistent  atrial fibrillation (HCC) On anticoagulation with Eliquis. Rate Controlled, heart rate 50s to 90s.  Not on rate limiting agents. - Resume Eliquis 10/23/21--pt had RVR>>transfer to SDU>>started on IV lopressor -now rate controlled on metoprolol po  Essential hypertension Previously on metoprolol  BPs remain stable/soft   Thrombocytopenia (HCC) Fairly chronic dating back to Feb 2023 -B12--1435 -folate-7.0 -TSH--1.132 -acute worsening due to pneumonia>improved with tx of infx  Class 1 obesity BMI 32.62 Lifestyle modification   Hypokalemia Potassium 3.1.  Normal magnesium 2.4. -Supplemented in the ED, and per HD  Goals of care, counseling/discussion Discussed with the patient's POA, Jody [name] last admission 10/23/21 -- I discussed the patient's overall poor prognosis given her frailty, multiple comorbid conditions, numerous hospitalizations and poor functional baseline -- I discussed that with the privilege of seeing the patient on numerous occasions, that the patient has continued to experience a functional decline with subsequent hospitalizations --Advance care planning, including the explanation and discussion of advance directives was carried out with the patient and Jody.  Code status including explanations of "Full Code" and "DNR" and alternatives were discussed in detail.  Discussion of end-of-life issues including but not limited palliative care, hospice care and the concept of hospice, other end-of-life care options, power of attorney for health care decisions, living wills, and physician orders for life-sustaining treatment were also discussed. Total face to face time 36 minutes. --HPOA expressed understanding and agrees to transition patient to DNR status -palliative medicine followed this admission and confirmed DNR        Consultants: renal Procedures performed: none  Disposition: Skilled nursing facility Diet recommendation:  Renal diet with 1200 cc fluid  restriction daily DISCHARGE MEDICATION: Allergies as of 11/02/2021       Reactions   Codeine Nausea And Vomiting        Medication List     STOP taking these medications    predniSONE 20 MG tablet Commonly known as: DELTASONE   predniSONE 50 MG tablet Commonly known as: DELTASONE       TAKE these medications    acetaminophen 325 MG tablet Commonly known as: TYLENOL Take 325 mg by mouth 3 (three) times daily.   albuterol (2.5 MG/3ML) 0.083% nebulizer solution Commonly known as: PROVENTIL Take 2.5 mg by nebulization every 6 (six) hours as needed for wheezing or shortness of breath. What changed: Another medication with the same name was changed. Make sure you understand how and when to take each.   albuterol 108 (90 Base) MCG/ACT inhaler Commonly known as: VENTOLIN HFA Inhale 2 puffs into the lungs every 6 (six) hours as needed for wheezing or shortness of breath. Reported on 03/01/2015 What changed: how much to take   ALPRAZolam 0.25 MG tablet Commonly known as: XANAX Take 1 tablet (0.25 mg total) by mouth 3 (three) times daily as needed.   apixaban 2.5 MG Tabs tablet Commonly known as: ELIQUIS Take 2.5 mg by mouth 2 (two) times daily.   CALAZIME SKIN PROTECTANT EX Apply 1 Application topically 3 (three) times daily. Apply to sacrum and right buttocks every shift until healed   calcitRIOL 0.5 MCG capsule Commonly known as: ROCALTROL Take 1 capsule (0.5  mcg total) by mouth Every Tuesday,Thursday,and Saturday with dialysis.   cefdinir 300 MG capsule Commonly known as: OMNICEF Take 1 capsule (300 mg total) by mouth daily. X 3 days Start taking on: November 03, 2021   cinacalcet 30 MG tablet Commonly known as: SENSIPAR Take 1 tablet (30 mg total) by mouth Every Tuesday,Thursday,and Saturday with dialysis.   dextromethorphan-guaiFENesin 30-600 MG 12hr tablet Commonly known as: MUCINEX DM Take 1 tablet by mouth 2 (two) times daily.   doxycycline 100 MG  tablet Commonly known as: VIBRA-TABS Take 1 tablet (100 mg total) by mouth every 12 (twelve) hours. X 3 days   feeding supplement (NEPRO CARB STEADY) Liqd Take 237 mLs by mouth 2 (two) times daily between meals.   ferrous sulfate 325 (65 FE) MG tablet Take 325 mg by mouth 2 (two) times daily with a meal.   folic acid 081 MCG tablet Commonly known as: FOLVITE Take 800 mcg by mouth daily.   gabapentin 300 MG capsule Commonly known as: NEURONTIN Take 300 mg by mouth daily.   ipratropium-albuterol 0.5-2.5 (3) MG/3ML Soln Commonly known as: DUONEB Take 3 mLs by nebulization every 4 (four) hours as needed.   lidocaine-prilocaine cream Commonly known as: EMLA Apply 1 Application topically as needed (pain).   metoprolol succinate 25 MG 24 hr tablet Commonly known as: TOPROL-XL Take 0.5 tablets (12.5 mg total) by mouth daily. Start taking on: November 03, 2021 What changed: how much to take   midodrine 5 MG tablet Commonly known as: PROAMATINE Take 3 tablets (15 mg total) by mouth 3 (three) times daily with meals. What changed:  medication strength how much to take   OXYGEN Inhale into the lungs. May use 2-4 liters   pravastatin 40 MG tablet Commonly known as: PRAVACHOL Take 40 mg by mouth daily.   PreserVision AREDS Tabs Take 1 tablet by mouth at bedtime.   ProAir Digihaler 108 (90 Base) MCG/ACT Aepb Generic drug: Albuterol Sulfate (sensor) Inhale into the lungs.   sevelamer carbonate 800 MG tablet Commonly known as: RENVELA Take 800 mg by mouth 3 (three) times daily with meals.        Discharge Exam: Filed Weights   11/01/21 1033 11/01/21 1320 11/01/21 1406  Weight: 79 kg 77.2 kg 77.4 kg   HEENT:  [place]/AT, No thrush, no icterus CV:  RRR, no rub, no S3, no S4 Lung:  bibasilar rales. No wheeze Abd:  soft/+BS, NT Ext:  No edema, no lymphangitis, no synovitis, no rash   Condition at discharge: stable  The results of significant diagnostics from this  hospitalization (including imaging, microbiology, ancillary and laboratory) are listed below for reference.   Imaging Studies: DG Chest Port 1 View  Result Date: 10/27/2021 CLINICAL DATA:  Shortness of breath EXAM: PORTABLE CHEST 1 VIEW COMPARISON:  Radiographs 10/23/2021 FINDINGS: Stable cardiomegaly. Right basilar atelectasis/consolidation. Interstitial markings are prominent bilaterally likely due to low lung volumes and chronic interstitial lung disease. Question trace pleural effusions bilaterally are pleural thickening. No pneumothorax. Elevated right hemidiaphragm. No acute osseous abnormality. Aortic atherosclerotic calcification. IMPRESSION: Elevated right hemidiaphragm with associated presumed atelectasis though pneumonia is difficult to exclude. Hypoinflation and chronic interstitial prominence. Bilateral trace pleural effusions or pleural thickening. Cardiomegaly. Electronically Signed   By: Placido Sou M.D.   On: 10/27/2021 19:15   ECHOCARDIOGRAM LIMITED  Result Date: 10/23/2021    ECHOCARDIOGRAM LIMITED REPORT   Patient Name:   TYKIERA RAVEN Date of Exam: 10/23/2021 Medical Rec #:  [id number]  Height:       62.0 in Accession #:    [id number]      Weight:       174.8 lb Date of Birth:  16-May-1940       BSA:          1.805 m Patient Age:    51 years        BP:           124/73 mmHg Patient Gender: F               HR:           99 bpm. Exam Location:  [facility] Procedure: Limited Echo, Limited Color Doppler and Cardiac Doppler Indications:    CHF-Acute Diastolic F62.13  History:        Patient has prior history of Echocardiogram examinations, most                 recent 06/03/2021. CHF, Previous Myocardial Infarction, Stroke                 and COPD, Arrythmias:Atrial Fibrillation; Risk                 Factors:Hypertension, Diabetes and Dyslipidemia. Hx of ESRD on                 dialysis,.  Sonographer:    Alvino Chapel RCS Referring Phys: [phone] Kenyana Husak IMPRESSIONS  1. Left ventricular  ejection fraction, by estimation, is 40 to 45%. The left ventricle has mildly decreased function. The left ventricle demonstrates global hypokinesis. There is moderate concentric left ventricular hypertrophy. Left ventricular diastolic function could not be evaluated. There is the interventricular septum is flattened in systole and diastole, consistent with right ventricular pressure and volume overload.  2. Right ventricular systolic function is moderately reduced. The right ventricular size is normal. A Prominent moderator band is visualized. There is moderately elevated pulmonary artery systolic pressure. The estimated right ventricular systolic pressure is 78.4 mmHg.  3. Left atrial size was severely dilated.  4. Right atrial size was severely dilated.  5. The mitral valve is degenerative. Moderate mitral annular calcification.  6. Tricuspid valve regurgitation is moderate.  7. The aortic valve is tricuspid. There is moderate calcification of the aortic valve. There is moderate thickening of the aortic valve. No aortic stenosis is present.  8. The inferior vena cava is dilated in size with <50% respiratory variability, suggesting right atrial pressure of 15 mmHg. FINDINGS  Left Ventricle: Left ventricular ejection fraction, by estimation, is 40 to 45%. The left ventricle has mildly decreased function. The left ventricle demonstrates global hypokinesis. The left ventricular internal cavity size was normal in size. There is  moderate concentric left ventricular hypertrophy. Abnormal (paradoxical) septal motion, consistent with left bundle branch block and the interventricular septum is flattened in systole and diastole, consistent with right ventricular pressure and volume overload. Left ventricular diastolic function could not be evaluated. Left ventricular diastolic function could not be evaluated due to atrial fibrillation. Right Ventricle: The right ventricular size is normal. No increase in right ventricular  wall thickness. Right ventricular systolic function is moderately reduced. There is moderately elevated pulmonary artery systolic pressure. The tricuspid regurgitant velocity is 3.26 m/s, and with an assumed right atrial pressure of 15 mmHg, the estimated right ventricular systolic pressure is 69.6 mmHg. Left Atrium: Left atrial size was severely dilated. Right Atrium: Right atrial size was severely dilated. Pericardium: There is no evidence of pericardial  effusion. Mitral Valve: The mitral valve is degenerative in appearance. There is moderate thickening of the mitral valve leaflet(s). There is moderate calcification of the mitral valve leaflet(s). Moderate mitral annular calcification. Tricuspid Valve: The tricuspid valve is normal in structure. Tricuspid valve regurgitation is moderate . No evidence of tricuspid stenosis. Aortic Valve: The aortic valve is tricuspid. There is moderate calcification of the aortic valve. There is moderate thickening of the aortic valve. No aortic stenosis is present. Pulmonic Valve: The pulmonic valve was normal in structure. Pulmonic valve regurgitation is not visualized. No evidence of pulmonic stenosis. Aorta: The aortic root is normal in size and structure. Venous: The inferior vena cava is dilated in size with less than 50% respiratory variability, suggesting right atrial pressure of 15 mmHg. IAS/Shunts: No atrial level shunt detected by color flow Doppler. Additional Comments: A Prominent moderator band is visualized. LEFT VENTRICLE PLAX 2D LVIDd:         4.10 cm LVIDs:         3.50 cm LV PW:         1.50 cm LV IVS:        1.40 cm LVOT diam:     1.80 cm LVOT Area:     2.54 cm  LV Volumes (MOD) LV vol d, MOD A2C: 78.4 ml LV vol d, MOD A4C: 47.9 ml LV vol s, MOD A2C: 40.6 ml LV vol s, MOD A4C: 27.3 ml LV SV MOD A2C:     37.8 ml LV SV MOD A4C:     47.9 ml LV SV MOD BP:      30.1 ml RIGHT VENTRICLE TAPSE (M-mode): 1.4 cm LEFT ATRIUM         Index LA diam:    4.50 cm 2.49 cm/m    AORTA Ao Root diam: 3.60 cm TRICUSPID VALVE TR Peak grad:   42.5 mmHg TR Vmax:        326.00 cm/s  SHUNTS Systemic Diam: 1.80 cm Fransico Him MD Electronically signed by Fransico Him MD Signature Date/Time: 10/23/2021/2:12:30 PM    Final    DG CHEST PORT 1 VIEW  Result Date: 10/23/2021 CLINICAL DATA:  Respiratory distress. EXAM: PORTABLE CHEST 1 VIEW COMPARISON:  Chest x-rays dated 10/20/2021, 09/06/2021 and 09/05/2021. FINDINGS: Stable cardiomegaly. Lungs are stable. No confluence opacity to suggest a developing pneumonia. No evidence of overt alveolar pulmonary edema. Coarse lung markings bilaterally suggests chronic interstitial lung disease/fibrosis. There is also chronic elevation of the RIGHT hemidiaphragm. No acute-appearing osseous abnormality. IMPRESSION: 1. No active disease. No evidence of pneumonia or alveolar pulmonary edema. 2. Stable cardiomegaly. 3. Probable chronic interstitial lung disease/fibrosis. 4. Stable pleural thickening/fluid at the RIGHT lung fissure. Electronically Signed   By: Franki Cabot M.D.   On: 10/23/2021 11:13   DG Chest Portable 1 View  Result Date: 10/20/2021 CLINICAL DATA:  Chest pain. Shortness of breath. Symptoms began during dialysis. EXAM: PORTABLE CHEST 1 VIEW COMPARISON:  One-view chest x-ray 09/06/2021 FINDINGS: The heart is large. Atherosclerotic calcifications are present at the aortic arch. Mild diffuse interstitial pattern is present. No significant scratched at no focal airspace consolidation is present. Chronic elevation of the right hemidiaphragm is noted. Lung volumes are low. Marked degenerative changes are present in both shoulders. IMPRESSION: 1. Cardiomegaly with mild interstitial edema. 2. No focal airspace disease. Electronically Signed   By: San Morelle M.D.   On: 10/20/2021 14:42    Microbiology: Results for orders placed or performed during the hospital encounter of 10/27/21  Resp Panel by RT-PCR (Flu A&B, Covid) Anterior Nasal Swab      Status: None   Collection Time: 10/27/21  6:39 PM   Specimen: Anterior Nasal Swab  Result Value Ref Range Status   SARS Coronavirus 2 by RT PCR NEGATIVE NEGATIVE Final    Comment: (NOTE) SARS-CoV-2 target nucleic acids are NOT DETECTED.  The SARS-CoV-2 RNA is generally detectable in upper respiratory specimens during the acute phase of infection. The lowest concentration of SARS-CoV-2 viral copies this assay can detect is 138 copies/mL. A negative result does not preclude SARS-Cov-2 infection and should not be used as the sole basis for treatment or other patient management decisions. A negative result may occur with  improper specimen collection/handling, submission of specimen other than nasopharyngeal swab, presence of viral mutation(s) within the areas targeted by this assay, and inadequate number of viral copies(<138 copies/mL). A negative result must be combined with clinical observations, patient history, and epidemiological information. The expected result is Negative.  Fact Sheet for Patients:  EntrepreneurPulse.com.au  Fact Sheet for Healthcare Providers:  IncredibleEmployment.be  This test is no t yet approved or cleared by the [place] FDA and  has been authorized for detection and/or diagnosis of SARS-CoV-2 by FDA under an Emergency Use Authorization (EUA). This EUA will remain  in effect (meaning this test can be used) for the duration of the COVID-19 declaration under Section 564(b)(1) of the Act, 21 U.S.C.section 360bbb-3(b)(1), unless the authorization is terminated  or revoked sooner.       Influenza A by PCR NEGATIVE NEGATIVE Final   Influenza B by PCR NEGATIVE NEGATIVE Final    Comment: (NOTE) The Xpert Xpress SARS-CoV-2/FLU/RSV plus assay is intended as an aid in the diagnosis of influenza from Nasopharyngeal swab specimens and should not be used as a sole basis for treatment. Nasal washings and aspirates are  unacceptable for Xpert Xpress SARS-CoV-2/FLU/RSV testing.  Fact Sheet for Patients: EntrepreneurPulse.com.au  Fact Sheet for Healthcare Providers: IncredibleEmployment.be  This test is not yet approved or cleared by the [place] FDA and has been authorized for detection and/or diagnosis of SARS-CoV-2 by FDA under an Emergency Use Authorization (EUA). This EUA will remain in effect (meaning this test can be used) for the duration of the COVID-19 declaration under Section 564(b)(1) of the Act, 21 U.S.C. section 360bbb-3(b)(1), unless the authorization is terminated or revoked.  Performed at [facility], [address], [place], [place] [address]   C Difficile Quick Screen w PCR reflex     Status: Abnormal   Collection Time: 10/29/21  3:41 PM   Specimen: STOOL  Result Value Ref Range Status   C Diff antigen POSITIVE (A) NEGATIVE Final   C Diff toxin NEGATIVE NEGATIVE Final   C Diff interpretation Results are indeterminate. See PCR results.  Final    Comment: Performed at [facility], [address] [place], [place] [address]  C. Diff by PCR, Reflexed     Status: None   Collection Time: 10/29/21  3:41 PM  Result Value Ref Range Status   Toxigenic C. Difficile by PCR NEGATIVE NEGATIVE Final    Comment: Patient is colonized with non toxigenic C. difficile. May not need treatment unless significant symptoms are present. Performed at [facility], [place] [address], [place], [place] [address]     Labs: CBC: Recent Labs  Lab 10/27/21 2054 10/28/21 0502 10/29/21 1056 10/30/21 0512 11/01/21 0500  WBC 20.8* 18.7* 10.0 10.8* 8.0  NEUTROABS  --  16.7*  --   --   --  HGB 12.9 12.4 11.2* 11.8* 11.5*  HCT 43.2 42.4 37.3 40.2 38.3  MCV 108.5* 110.1* 105.1* 108.6* 107.6*  PLT 107* 143* 181 172 732   Basic Metabolic Panel: Recent Labs  Lab 10/27/21 2054 10/28/21 0502 10/29/21 1056 10/30/21 0512  NA 134* 135 133* 134*  K 4.1 4.7 3.9  3.3*  CL 93* 96* 95* 92*  CO2 '22 24 23 25  '$ GLUCOSE 176* 160* 170* 191*  BUN 43* 48* 66* 45*  CREATININE 2.76* 2.91* 3.84* 2.81*  CALCIUM 9.7 9.5 9.5 9.3  MG  --  2.7*  --   --   PHOS  --   --  5.4* 4.2   Liver Function Tests: Recent Labs  Lab 10/27/21 2054 10/28/21 0502 10/29/21 1056 10/30/21 0512  AST 29 133*  --   --   ALT 32 163*  --   --   ALKPHOS 109 111  --   --   BILITOT 1.3* 1.1  --   --   PROT 7.3 7.0  --   --   ALBUMIN 4.1 3.9 3.6 4.0   CBG: Recent Labs  Lab 10/28/21 0746  GLUCAP 155*    Discharge time spent: greater than 30 minutes.  Signed: Orson Eva, MD [facility] 11/02/2021

## 2021-11-03 DIAGNOSIS — G9341 Metabolic encephalopathy: Secondary | ICD-10-CM | POA: Diagnosis not present

## 2021-11-03 DIAGNOSIS — N186 End stage renal disease: Secondary | ICD-10-CM | POA: Diagnosis not present

## 2021-11-03 DIAGNOSIS — J9601 Acute respiratory failure with hypoxia: Secondary | ICD-10-CM | POA: Diagnosis not present

## 2021-11-03 DIAGNOSIS — J189 Pneumonia, unspecified organism: Secondary | ICD-10-CM | POA: Diagnosis not present

## 2021-11-03 LAB — RENAL FUNCTION PANEL
Albumin: 4 g/dL (ref 3.5–5.0)
Anion gap: 15 (ref 5–15)
BUN: 61 mg/dL — ABNORMAL HIGH (ref 8–23)
CO2: 26 mmol/L (ref 22–32)
Calcium: 10 mg/dL (ref 8.9–10.3)
Chloride: 92 mmol/L — ABNORMAL LOW (ref 98–111)
Creatinine, Ser: 4.13 mg/dL — ABNORMAL HIGH (ref 0.44–1.00)
GFR, Estimated: 10 mL/min — ABNORMAL LOW (ref >60)
Glucose, Bld: 245 mg/dL — ABNORMAL HIGH (ref 70–99)
Phosphorus: 6.2 mg/dL — ABNORMAL HIGH (ref 2.5–4.6)
Potassium: 4.5 mmol/L (ref 3.5–5.1)
Sodium: 133 mmol/L — ABNORMAL LOW (ref 135–145)

## 2021-11-03 LAB — CBC
HCT: 39.9 % (ref 36.0–46.0)
Hemoglobin: 11.7 g/dL — ABNORMAL LOW (ref 12.0–15.0)
MCH: 32.3 pg (ref 26.0–34.0)
MCHC: 29.3 g/dL — ABNORMAL LOW (ref 30.0–36.0)
MCV: 110.2 fL — ABNORMAL HIGH (ref 80.0–100.0)
Platelets: 162 10*3/uL (ref 150–400)
RBC: 3.62 MIL/uL — ABNORMAL LOW (ref 3.87–5.11)
RDW: 18.7 % — ABNORMAL HIGH (ref 11.5–15.5)
WBC: 7.1 10*3/uL (ref 4.0–10.5)
nRBC: 0.6 % — ABNORMAL HIGH (ref 0.0–0.2)

## 2021-11-03 MED ORDER — ALBUMIN HUMAN 25 % IV SOLN: 25.0000 g | INTRAVENOUS | Status: DC | PRN

## 2021-11-03 MED ORDER — DOXYCYCLINE HYCLATE 100 MG PO TABS: 100.0000 mg | ORAL_TABLET | Freq: Two times a day (BID) | ORAL | 0 refills | Status: AC

## 2021-11-03 MED ORDER — DOXYCYCLINE HYCLATE 100 MG PO TABS
100.0000 mg | ORAL_TABLET | Freq: Two times a day (BID) | ORAL | Status: DC
  Administered 2021-11-03: 100 mg via ORAL
  Filled 2021-11-03: qty 1

## 2021-11-03 NOTE — Discharge Summary (Addendum)
Physician Discharge Summary   Patient: Jody [name] MRN: [id number] DOB: March 05, 1940  Admit date:     10/27/2021  Discharge date: 11/03/21  Discharge Physician: Shanon Brow Lujuana Kapler   PCP: Monico Blitz, MD   Recommendations at discharge:   Please follow up with primary care provider within 1-2 weeks  Please repeat BMP and CBC in one week     Hospital Course: 81 year old female with history of stroke, COPD, chronic respiratory failure on 3 L, systolic and diastolic CHF, persistent atrial fibrillation, ESRD, iron deficiency presenting from her nursing facility [facility]) from which she is a permanent resident secondary to shortness of breath.  Unfortunately patient is too altered at time of admission to provide history.  She was more alert on first presentation to the ER.  She had told the ER staff that she started to have shortness of breath when dialysis finished.  They checked her oxygen on her baseline 3 L nasal cannula and it was 60%.  She came into the ER on nonrebreather.  Work-up revealed a likely pneumonia and she was started on Rocephin and Zithromax.  Patient slowly became more somnolent while she was in the ER.  When I saw her she would not speak, but would look in the eye, and open her eyes to voice.  Patient is DNR.  BiPAP was started.  ABG showed a hypercarbia.  Admitted to stepdown on BiPAP.  Patient does get dialysis Tuesday, Thursday, Saturday.  She appears clinically fluid overloaded.  Patient does have an elevated but stable troponin that is likely demand ischemia in the setting of hypoxia.  Patient is not able to discuss any complaints at this time.   Patient has DNR form at bedside.   It is noted the patient was recently discharged 4 days PTA.  At that admission patient was also volume overloaded.  Her dialysis sessions have been cut short due to leg pain.  She did have a cough productive of white sputum at that time.  She was hypoxic as well with her oxygen sats down to 70s on her  baseline 3 L nasal cannula.  Her troponins were lower at that time and 50, 24 during that hospitalization patient continued to have episodes of shortness of breath on her nondialysis days.  This was thought to be multifactorial with COPD, anxiety, and hypervolemia all contributing.  Respiratory reports the patient was calling out every hour for breathing treatment, and they believed it to be anxiety related.  They do note that her somnolence now is a change from her baseline. Patient started on ceftriaxone and azithromycin.  Her WBC improved from 20.8 to 8.0.  She was weaned off BiPAP back to her baseline oxygen.  Her mental status improved back to baseline.    Assessment and Plan: Acute on chronic respiratory failure with hypoxia and hypercapnia (HCC) Personally reviewed CXR--increased interstitial markings Initially on BiPAP Chronic on 3 L --Patient continues to have intermittent episodes of dyspnea and tachypnea>> suspect this is being driven by underlying A-fib with RVR, pneumonia, anxiety, and continued hypervolemic state with -- now back on 3L -- 10/24/21 Echo-EF 40-45%, global HK, RV overload, PASP 57.5   Lobar Pneumonia -initially started on ceftriaxone and azithro -transitioned to cefdinir and doxy>>plan 3 more days PCT>>8.60>>6.08   ESRD on dialysis [facility]) Schedule--Tuesday Thursday Saturday.   Reports compliance with her HD sessions. -Resume Renal meds. -Appreciate nephrology -last HD on 11/03/21   Acute on chronic combined systolic and diastolic CHF (congestive heart failure) ([place])  Was above her usual dry weight - Manage volume with HD -06/03/21 Echo EF 50%, RV overload, severe TR -08/02/20 Echo EF 45-50% HK inferoseptal, inferolateral, Inf wall; mod TR -- 9/10 limited echo-- 10/24/21 Echo-EF 40-45%, global HK, RV overload, PASP 57.5 -last HD 11/03/21 --continue metoprolol succinate  Dysphagia -evaluated by speech -continue dys 2 diet with thin liquids   Persistent atrial  fibrillation (HCC) On anticoagulation with Eliquis. Rate Controlled, heart rate 50s to 90s.  Not on rate limiting agents. - Resume Eliquis 10/23/21--pt had RVR>>transfer to SDU>>started on IV lopressor -now rate controlled on metoprolol po   Essential hypertension Previously on metoprolol  BPs remain stable/soft   Thrombocytopenia (HCC) Fairly chronic dating back to Feb 2023 -B12--1435 -folate-7.0 -TSH--1.132 -acute worsening due to pneumonia>improved with tx of infx    PAD (peripheral artery disease) (HCC) - Continue statin and ASA   Hypokalemia Potassium 3.1.  Normal magnesium 2.4. -Supplemented in the ED, and per HD   Goals of care, counseling/discussion Discussed with the patient's POA, [name] last admission 10/23/21 -- I discussed the patient's overall poor prognosis given her frailty, multiple comorbid conditions, numerous hospitalizations and poor functional baseline -- I discussed that with the privilege of seeing the patient on numerous occasions, that the patient has continued to experience a functional decline with subsequent hospitalizations --Advance care planning, including the explanation and discussion of advance directives was carried out with the patient and [name].  Code status including explanations of "Full Code" and "DNR" and alternatives were discussed in detail.  Discussion of end-of-life issues including but not limited palliative care, hospice care and the concept of hospice, other end-of-life care options, power of attorney for health care decisions, living wills, and physician orders for life-sustaining treatment were also discussed. Total face to face time 36 minutes. --HPOA expressed understanding and agrees to transition patient to DNR status -palliative medicine followed this admission and confirmed DNR           Consultants: renal, palliative medicine Procedures performed: none  Disposition: Skilled nursing facility Diet recommendation:   Dysphagia type 2 Thin  Liquid with 1200 cc fluid restriction daily DISCHARGE MEDICATION: Allergies as of 11/03/2021       Reactions   Codeine Nausea And Vomiting        Medication List     STOP taking these medications    predniSONE 20 MG tablet Commonly known as: DELTASONE   predniSONE 50 MG tablet Commonly known as: DELTASONE       TAKE these medications    acetaminophen 325 MG tablet Commonly known as: TYLENOL Take 325 mg by mouth 3 (three) times daily.   albuterol (2.5 MG/3ML) 0.083% nebulizer solution Commonly known as: PROVENTIL Take 2.5 mg by nebulization every 6 (six) hours as needed for wheezing or shortness of breath. What changed: Another medication with the same name was changed. Make sure you understand how and when to take each.   albuterol 108 (90 Base) MCG/ACT inhaler Commonly known as: VENTOLIN HFA Inhale 2 puffs into the lungs every 6 (six) hours as needed for wheezing or shortness of breath. Reported on 03/01/2015 What changed: how much to take   ALPRAZolam 0.25 MG tablet Commonly known as: XANAX Take 1 tablet (0.25 mg total) by mouth 3 (three) times daily as needed.   apixaban 2.5 MG Tabs tablet Commonly known as: ELIQUIS Take 2.5 mg by mouth 2 (two) times daily.   CALAZIME SKIN PROTECTANT EX Apply 1 Application topically 3 (three) times daily.  Apply to sacrum and right buttocks every shift until healed   calcitRIOL 0.5 MCG capsule Commonly known as: ROCALTROL Take 1 capsule (0.5 mcg total) by mouth Every Tuesday,Thursday,and Saturday with dialysis.   cefdinir 300 MG capsule Commonly known as: OMNICEF Take 1 capsule (300 mg total) by mouth daily. X 3 days   cinacalcet 30 MG tablet Commonly known as: SENSIPAR Take 1 tablet (30 mg total) by mouth Every Tuesday,Thursday,and Saturday with dialysis.   dextromethorphan-guaiFENesin 30-600 MG 12hr tablet Commonly known as: MUCINEX DM Take 1 tablet by mouth 2 (two) times daily.    doxycycline 100 MG tablet Commonly known as: VIBRA-TABS Take 1 tablet (100 mg total) by mouth every 12 (twelve) hours. X 3 days   feeding supplement (NEPRO CARB STEADY) Liqd Take 237 mLs by mouth 2 (two) times daily between meals.   ferrous sulfate 325 (65 FE) MG tablet Take 325 mg by mouth 2 (two) times daily with a meal.   folic acid 412 MCG tablet Commonly known as: FOLVITE Take 800 mcg by mouth daily.   gabapentin 300 MG capsule Commonly known as: NEURONTIN Take 300 mg by mouth daily.   ipratropium-albuterol 0.5-2.5 (3) MG/3ML Soln Commonly known as: DUONEB Take 3 mLs by nebulization every 4 (four) hours as needed.   lidocaine-prilocaine cream Commonly known as: EMLA Apply 1 Application topically as needed (pain).   metoprolol succinate 25 MG 24 hr tablet Commonly known as: TOPROL-XL Take 0.5 tablets (12.5 mg total) by mouth daily. What changed: how much to take   midodrine 5 MG tablet Commonly known as: PROAMATINE Take 3 tablets (15 mg total) by mouth 3 (three) times daily with meals. What changed:  medication strength how much to take   OXYGEN Inhale into the lungs. May use 2-4 liters   pravastatin 40 MG tablet Commonly known as: PRAVACHOL Take 40 mg by mouth daily.   PreserVision AREDS Tabs Take 1 tablet by mouth at bedtime.   ProAir Digihaler 108 (90 Base) MCG/ACT Aepb Generic drug: Albuterol Sulfate (sensor) Inhale into the lungs.   sevelamer carbonate 800 MG tablet Commonly known as: RENVELA Take 800 mg by mouth 3 (three) times daily with meals.        Discharge Exam: Filed Weights   11/01/21 1033 11/01/21 1320 11/01/21 1406  Weight: 79 kg 77.2 kg 77.4 kg   HEENT:  [place]/AT, No thrush, no icterus CV:  IRRR, no rub, no S3, no S4 Lung:  bibasilar rales. No wheeze Abd:  soft/+BS, NT Ext:  No edema, no lymphangitis, no synovitis, no rash   Condition at discharge: stable  The results of significant diagnostics from this hospitalization  (including imaging, microbiology, ancillary and laboratory) are listed below for reference.   Imaging Studies: DG CHEST PORT 1 VIEW  Result Date: 11/02/2021 CLINICAL DATA:  Respiratory distress EXAM: PORTABLE CHEST 1 VIEW COMPARISON:  10/27/2021 FINDINGS: Poor inspiration. Cardiomegaly and aortic atherosclerosis. Pulmonary venous hypertension with mild interstitial edema. Small effusions and lower lobe atelectasis. IMPRESSION: Findings most consistent with congestive heart failure. Cannot rule out coexistent pneumonia in the lower lobes. Similar appearance compared to the study of 09/14. There may be slightly worse volume loss at the left base. Electronically Signed   By: Nelson Chimes M.D.   On: 11/02/2021 13:36   DG Chest Port 1 View  Result Date: 10/27/2021 CLINICAL DATA:  Shortness of breath EXAM: PORTABLE CHEST 1 VIEW COMPARISON:  Radiographs 10/23/2021 FINDINGS: Stable cardiomegaly. Right basilar atelectasis/consolidation. Interstitial markings are prominent bilaterally  likely due to low lung volumes and chronic interstitial lung disease. Question trace pleural effusions bilaterally are pleural thickening. No pneumothorax. Elevated right hemidiaphragm. No acute osseous abnormality. Aortic atherosclerotic calcification. IMPRESSION: Elevated right hemidiaphragm with associated presumed atelectasis though pneumonia is difficult to exclude. Hypoinflation and chronic interstitial prominence. Bilateral trace pleural effusions or pleural thickening. Cardiomegaly. Electronically Signed   By: Placido Sou M.D.   On: 10/27/2021 19:15   ECHOCARDIOGRAM LIMITED  Result Date: 10/23/2021    ECHOCARDIOGRAM LIMITED REPORT   Patient Name:   Jody [name] Date of Exam: 10/23/2021 Medical Rec #:  [id number]       Height:       62.0 in Accession #:    [id number]      Weight:       174.8 lb Date of Birth:  12-06-1940       BSA:          1.805 m Patient Age:    21 years        BP:           124/73 mmHg Patient Gender:  F               HR:           99 bpm. Exam Location:  [facility] Procedure: Limited Echo, Limited Color Doppler and Cardiac Doppler Indications:    CHF-Acute Diastolic W25.85  History:        Patient has prior history of Echocardiogram examinations, most                 recent 06/03/2021. CHF, Previous Myocardial Infarction, Stroke                 and COPD, Arrythmias:Atrial Fibrillation; Risk                 Factors:Hypertension, Diabetes and Dyslipidemia. Hx of ESRD on                 dialysis,.  Sonographer:    Alvino Chapel RCS Referring Phys: [phone] Camdin Hegner IMPRESSIONS  1. Left ventricular ejection fraction, by estimation, is 40 to 45%. The left ventricle has mildly decreased function. The left ventricle demonstrates global hypokinesis. There is moderate concentric left ventricular hypertrophy. Left ventricular diastolic function could not be evaluated. There is the interventricular septum is flattened in systole and diastole, consistent with right ventricular pressure and volume overload.  2. Right ventricular systolic function is moderately reduced. The right ventricular size is normal. A Prominent moderator band is visualized. There is moderately elevated pulmonary artery systolic pressure. The estimated right ventricular systolic pressure is 24.2 mmHg.  3. Left atrial size was severely dilated.  4. Right atrial size was severely dilated.  5. The mitral valve is degenerative. Moderate mitral annular calcification.  6. Tricuspid valve regurgitation is moderate.  7. The aortic valve is tricuspid. There is moderate calcification of the aortic valve. There is moderate thickening of the aortic valve. No aortic stenosis is present.  8. The inferior vena cava is dilated in size with <50% respiratory variability, suggesting right atrial pressure of 15 mmHg. FINDINGS  Left Ventricle: Left ventricular ejection fraction, by estimation, is 40 to 45%. The left ventricle has mildly decreased function. The left ventricle  demonstrates global hypokinesis. The left ventricular internal cavity size was normal in size. There is  moderate concentric left ventricular hypertrophy. Abnormal (paradoxical) septal motion, consistent with left bundle branch block and the interventricular septum is flattened in systole  and diastole, consistent with right ventricular pressure and volume overload. Left ventricular diastolic function could not be evaluated. Left ventricular diastolic function could not be evaluated due to atrial fibrillation. Right Ventricle: The right ventricular size is normal. No increase in right ventricular wall thickness. Right ventricular systolic function is moderately reduced. There is moderately elevated pulmonary artery systolic pressure. The tricuspid regurgitant velocity is 3.26 m/s, and with an assumed right atrial pressure of 15 mmHg, the estimated right ventricular systolic pressure is 44.8 mmHg. Left Atrium: Left atrial size was severely dilated. Right Atrium: Right atrial size was severely dilated. Pericardium: There is no evidence of pericardial effusion. Mitral Valve: The mitral valve is degenerative in appearance. There is moderate thickening of the mitral valve leaflet(s). There is moderate calcification of the mitral valve leaflet(s). Moderate mitral annular calcification. Tricuspid Valve: The tricuspid valve is normal in structure. Tricuspid valve regurgitation is moderate . No evidence of tricuspid stenosis. Aortic Valve: The aortic valve is tricuspid. There is moderate calcification of the aortic valve. There is moderate thickening of the aortic valve. No aortic stenosis is present. Pulmonic Valve: The pulmonic valve was normal in structure. Pulmonic valve regurgitation is not visualized. No evidence of pulmonic stenosis. Aorta: The aortic root is normal in size and structure. Venous: The inferior vena cava is dilated in size with less than 50% respiratory variability, suggesting right atrial pressure of 15  mmHg. IAS/Shunts: No atrial level shunt detected by color flow Doppler. Additional Comments: A Prominent moderator band is visualized. LEFT VENTRICLE PLAX 2D LVIDd:         4.10 cm LVIDs:         3.50 cm LV PW:         1.50 cm LV IVS:        1.40 cm LVOT diam:     1.80 cm LVOT Area:     2.54 cm  LV Volumes (MOD) LV vol d, MOD A2C: 78.4 ml LV vol d, MOD A4C: 47.9 ml LV vol s, MOD A2C: 40.6 ml LV vol s, MOD A4C: 27.3 ml LV SV MOD A2C:     37.8 ml LV SV MOD A4C:     47.9 ml LV SV MOD BP:      30.1 ml RIGHT VENTRICLE TAPSE (M-mode): 1.4 cm LEFT ATRIUM         Index LA diam:    4.50 cm 2.49 cm/m   AORTA Ao Root diam: 3.60 cm TRICUSPID VALVE TR Peak grad:   42.5 mmHg TR Vmax:        326.00 cm/s  SHUNTS Systemic Diam: 1.80 cm Fransico Him MD Electronically signed by Fransico Him MD Signature Date/Time: 10/23/2021/2:12:30 PM    Final    DG CHEST PORT 1 VIEW  Result Date: 10/23/2021 CLINICAL DATA:  Respiratory distress. EXAM: PORTABLE CHEST 1 VIEW COMPARISON:  Chest x-rays dated 10/20/2021, 09/06/2021 and 09/05/2021. FINDINGS: Stable cardiomegaly. Lungs are stable. No confluence opacity to suggest a developing pneumonia. No evidence of overt alveolar pulmonary edema. Coarse lung markings bilaterally suggests chronic interstitial lung disease/fibrosis. There is also chronic elevation of the RIGHT hemidiaphragm. No acute-appearing osseous abnormality. IMPRESSION: 1. No active disease. No evidence of pneumonia or alveolar pulmonary edema. 2. Stable cardiomegaly. 3. Probable chronic interstitial lung disease/fibrosis. 4. Stable pleural thickening/fluid at the RIGHT lung fissure. Electronically Signed   By: Franki Cabot M.D.   On: 10/23/2021 11:13   DG Chest Portable 1 View  Result Date: 10/20/2021 CLINICAL DATA:  Chest pain.  Shortness of breath. Symptoms began during dialysis. EXAM: PORTABLE CHEST 1 VIEW COMPARISON:  One-view chest x-ray 09/06/2021 FINDINGS: The heart is large. Atherosclerotic calcifications are present  at the aortic arch. Mild diffuse interstitial pattern is present. No significant scratched at no focal airspace consolidation is present. Chronic elevation of the right hemidiaphragm is noted. Lung volumes are low. Marked degenerative changes are present in both shoulders. IMPRESSION: 1. Cardiomegaly with mild interstitial edema. 2. No focal airspace disease. Electronically Signed   By: San Morelle M.D.   On: 10/20/2021 14:42    Microbiology: Results for orders placed or performed during the hospital encounter of 10/27/21  Resp Panel by RT-PCR (Flu A&B, Covid) Anterior Nasal Swab     Status: None   Collection Time: 10/27/21  6:39 PM   Specimen: Anterior Nasal Swab  Result Value Ref Range Status   SARS Coronavirus 2 by RT PCR NEGATIVE NEGATIVE Final    Comment: (NOTE) SARS-CoV-2 target nucleic acids are NOT DETECTED.  The SARS-CoV-2 RNA is generally detectable in upper respiratory specimens during the acute phase of infection. The lowest concentration of SARS-CoV-2 viral copies this assay can detect is 138 copies/mL. A negative result does not preclude SARS-Cov-2 infection and should not be used as the sole basis for treatment or other patient management decisions. A negative result may occur with  improper specimen collection/handling, submission of specimen other than nasopharyngeal swab, presence of viral mutation(s) within the areas targeted by this assay, and inadequate number of viral copies(<138 copies/mL). A negative result must be combined with clinical observations, patient history, and epidemiological information. The expected result is Negative.  Fact Sheet for Patients:  EntrepreneurPulse.com.au  Fact Sheet for Healthcare Providers:  IncredibleEmployment.be  This test is no t yet approved or cleared by the [place] FDA and  has been authorized for detection and/or diagnosis of SARS-CoV-2 by FDA under an Emergency Use  Authorization (EUA). This EUA will remain  in effect (meaning this test can be used) for the duration of the COVID-19 declaration under Section 564(b)(1) of the Act, 21 U.S.C.section 360bbb-3(b)(1), unless the authorization is terminated  or revoked sooner.       Influenza A by PCR NEGATIVE NEGATIVE Final   Influenza B by PCR NEGATIVE NEGATIVE Final    Comment: (NOTE) The Xpert Xpress SARS-CoV-2/FLU/RSV plus assay is intended as an aid in the diagnosis of influenza from Nasopharyngeal swab specimens and should not be used as a sole basis for treatment. Nasal washings and aspirates are unacceptable for Xpert Xpress SARS-CoV-2/FLU/RSV testing.  Fact Sheet for Patients: EntrepreneurPulse.com.au  Fact Sheet for Healthcare Providers: IncredibleEmployment.be  This test is not yet approved or cleared by the [place] FDA and has been authorized for detection and/or diagnosis of SARS-CoV-2 by FDA under an Emergency Use Authorization (EUA). This EUA will remain in effect (meaning this test can be used) for the duration of the COVID-19 declaration under Section 564(b)(1) of the Act, 21 U.S.C. section 360bbb-3(b)(1), unless the authorization is terminated or revoked.  Performed at [facility], [address], [place], [place] [address]   C Difficile Quick Screen w PCR reflex     Status: Abnormal   Collection Time: 10/29/21  3:41 PM   Specimen: STOOL  Result Value Ref Range Status   C Diff antigen POSITIVE (A) NEGATIVE Final   C Diff toxin NEGATIVE NEGATIVE Final   C Diff interpretation Results are indeterminate. See PCR results.  Final    Comment: Performed at [facility]  [facility], [address] [place], [place] [address]  C. Diff by PCR, Reflexed     Status: None   Collection Time: 10/29/21  3:41 PM  Result Value Ref Range Status   Toxigenic C. Difficile by PCR NEGATIVE NEGATIVE Final    Comment: Patient is colonized with non toxigenic C. difficile.  May not need treatment unless significant symptoms are present. Performed at [facility], [place] [address] [place], [place] [address]     Labs: CBC: Recent Labs  Lab 10/27/21 2054 10/28/21 0502 10/29/21 1056 10/30/21 0512 11/01/21 0500  WBC 20.8* 18.7* 10.0 10.8* 8.0  NEUTROABS  --  16.7*  --   --   --   HGB 12.9 12.4 11.2* 11.8* 11.5*  HCT 43.2 42.4 37.3 40.2 38.3  MCV 108.5* 110.1* 105.1* 108.6* 107.6*  PLT 107* 143* 181 172 098   Basic Metabolic Panel: Recent Labs  Lab 10/27/21 2054 10/28/21 0502 10/29/21 1056 10/30/21 0512  NA 134* 135 133* 134*  K 4.1 4.7 3.9 3.3*  CL 93* 96* 95* 92*  CO2 '22 24 23 25  '$ GLUCOSE 176* 160* 170* 191*  BUN 43* 48* 66* 45*  CREATININE 2.76* 2.91* 3.84* 2.81*  CALCIUM 9.7 9.5 9.5 9.3  MG  --  2.7*  --   --   PHOS  --   --  5.4* 4.2   Liver Function Tests: Recent Labs  Lab 10/27/21 2054 10/28/21 0502 10/29/21 1056 10/30/21 0512  AST 29 133*  --   --   ALT 32 163*  --   --   ALKPHOS 109 111  --   --   BILITOT 1.3* 1.1  --   --   PROT 7.3 7.0  --   --   ALBUMIN 4.1 3.9 3.6 4.0   CBG: Recent Labs  Lab 10/28/21 0746  GLUCAP 155*    Discharge time spent: greater than 30 minutes.  Signed: Orson Eva, MD [facility] 11/03/2021

## 2021-11-03 NOTE — Procedures (Signed)
   HEMODIALYSIS TREATMENT NOTE:   3.25 hour heparin-free treatment completed using right upper arm AVF (15g/antegrade). Goal met: 3.1 liters removed without interruption in UF.  Difficult to obtain spO2 reading on fingers - readings 70-84%-- but sp92-96% on 5L when reading taken in earlobes.   Hand-off given to Warner Mccreedy, LPN.   Rockwell Alexandria, RN

## 2021-11-03 NOTE — Progress Notes (Addendum)
Patient ID: Jody [name], female   DOB: 03/30/1940, 81 y.o.   MRN: [id number] S: patient seen and examined bedside. D/c held due to hypoxia/SOB when transferring from bed to stretcher. Cxr yesterday consistent with CHF, possibly PNA (procal wnl) O:BP 102/62 (BP Location: Left Arm)   Pulse (!) 59   Temp 98.1 F (36.7 C)   Resp 18   Ht '5\' 7"'$  (1.702 m)   Wt 77.4 kg   SpO2 99%   BMI 26.73 kg/m   Intake/Output Summary (Last 24 hours) at 11/03/2021 0855 Last data filed at 11/02/2021 2000 Gross per 24 hour  Intake 240 ml  Output --  Net 240 ml   Intake/Output: I/O last 3 completed shifts: In: 240 [P.O.:240] Out: -   Intake/Output this shift:  No intake/output data recorded. Weight change:  WLN:LGXQJ, NAD, sitting up in bed CVS: RRR Resp: bibasilar rales Abd: +BS, soft, NT/ND Ext: trace edema bl LEs, RUE AVF +T/B Neuro: awake, alert, following commands  Recent Labs  Lab 10/27/21 2054 10/28/21 0502 10/29/21 1056 10/30/21 0512  NA 134* 135 133* 134*  K 4.1 4.7 3.9 3.3*  CL 93* 96* 95* 92*  CO2 '22 24 23 25  '$ GLUCOSE 176* 160* 170* 191*  BUN 43* 48* 66* 45*  CREATININE 2.76* 2.91* 3.84* 2.81*  ALBUMIN 4.1 3.9 3.6 4.0  CALCIUM 9.7 9.5 9.5 9.3  PHOS  --   --  5.4* 4.2  AST 29 133*  --   --   ALT 32 163*  --   --    Liver Function Tests: Recent Labs  Lab 10/27/21 2054 10/28/21 0502 10/29/21 1056 10/30/21 0512  AST 29 133*  --   --   ALT 32 163*  --   --   ALKPHOS 109 111  --   --   BILITOT 1.3* 1.1  --   --   PROT 7.3 7.0  --   --   ALBUMIN 4.1 3.9 3.6 4.0   No results for input(s): "LIPASE", "AMYLASE" in the last 168 hours. No results for input(s): "AMMONIA" in the last 168 hours. CBC: Recent Labs  Lab 10/27/21 2054 10/28/21 0502 10/29/21 1056 10/30/21 0512 11/01/21 0500  WBC 20.8* 18.7* 10.0 10.8* 8.0  NEUTROABS  --  16.7*  --   --   --   HGB 12.9 12.4 11.2* 11.8* 11.5*  HCT 43.2 42.4 37.3 40.2 38.3  MCV 108.5* 110.1* 105.1* 108.6* 107.6*  PLT 107*  143* 181 172 177   Cardiac Enzymes: No results for input(s): "CKTOTAL", "CKMB", "CKMBINDEX", "TROPONINI" in the last 168 hours. CBG: Recent Labs  Lab 10/28/21 0746  GLUCAP 155*    Iron Studies: No results for input(s): "IRON", "TIBC", "TRANSFERRIN", "FERRITIN" in the last 72 hours. Studies/Results: DG CHEST PORT 1 VIEW  Result Date: 11/02/2021 CLINICAL DATA:  Respiratory distress EXAM: PORTABLE CHEST 1 VIEW COMPARISON:  10/27/2021 FINDINGS: Poor inspiration. Cardiomegaly and aortic atherosclerosis. Pulmonary venous hypertension with mild interstitial edema. Small effusions and lower lobe atelectasis. IMPRESSION: Findings most consistent with congestive heart failure. Cannot rule out coexistent pneumonia in the lower lobes. Similar appearance compared to the study of 09/14. There may be slightly worse volume loss at the left base. Electronically Signed   By: Nelson Chimes M.D.   On: 11/02/2021 13:36    apixaban  2.5 mg Oral BID   calcitRIOL  0.5 mcg Oral Q T,Th,Sat-1800   cefdinir  300 mg Oral Daily   Chlorhexidine Gluconate Cloth  6 each  Topical Daily   Chlorhexidine Gluconate Cloth  6 each Topical Q0600   cinacalcet  30 mg Oral Q T,Th,Sat-1800   doxycycline  100 mg Oral Q12H   feeding supplement (NEPRO CARB STEADY)  237 mL Oral BID BM   folic acid  1 mg Oral Daily   gabapentin  300 mg Oral Daily   ipratropium-albuterol  3 mL Nebulization BID   methylPREDNISolone (SOLU-MEDROL) injection  60 mg Intravenous Q12H   metoprolol succinate  12.5 mg Oral Daily   midodrine  15 mg Oral TID WC   nystatin  5 mL Oral QID   pravastatin  40 mg Oral q1800   sevelamer carbonate  800 mg Oral TID WC    BMET    Component Value Date/Time   NA 134 (L) 10/30/2021 0512   K 3.3 (L) 10/30/2021 0512   CL 92 (L) 10/30/2021 0512   CO2 25 10/30/2021 0512   GLUCOSE 191 (H) 10/30/2021 0512   BUN 45 (H) 10/30/2021 0512   CREATININE 2.81 (H) 10/30/2021 0512   CALCIUM 9.3 10/30/2021 0512   GFRNONAA 16 (L)  10/30/2021 0512   GFRAA 9 (L) 10/27/2019 0112   CBC    Component Value Date/Time   WBC 8.0 11/01/2021 0500   RBC 3.56 (L) 11/01/2021 0500   HGB 11.5 (L) 11/01/2021 0500   HCT 38.3 11/01/2021 0500   PLT 177 11/01/2021 0500   MCV 107.6 (H) 11/01/2021 0500   MCH 32.3 11/01/2021 0500   MCHC 30.0 11/01/2021 0500   RDW 19.0 (H) 11/01/2021 0500   LYMPHSABS 0.6 (L) 10/28/2021 0502   MONOABS 1.3 (H) 10/28/2021 0502   EOSABS 0.0 10/28/2021 0502   BASOSABS 0.0 10/28/2021 0502    OP HD orders:  [facility], TTS, 3 hours 15 minutes.  EDW 81 kg.  2K/2.5 calcium.  RUE AVF 400/500.  Meds: Mircera 200 mcg every 2 weeks, Venofer 50 mg q, calcitriol 0.5 mcg 3 times a week, Sensipar 30 mg 3 times a week.  No heparin (history of GI bleed)   Assessment/Plan:   SOB - presumably due to HCAP. Now with possible pulm edema. On cefdinir, will UF as tolerated ESRD - will continue with HD on TTS schedule while she remains an inpatient. Hypotension - recent increase of midodrine to 15 mg tid Volume - improved with HD.  Anemia of ESKD - hgb stable no esa Atrial fibrillation - persistent on metoprolol and Eliquis.  FTT -  she has had multiple admissions (monthly) and lives in [place].  Currently DNR.  Will need outpatient palliative care. Disposition - open for discharge to [place], [facility]

## 2021-11-03 NOTE — Progress Notes (Signed)
Palliative: Mrs. Jody [name] is sitting up quietly in bed.  She is sleeping soundly, and is difficult to wake.  She does seem subdued today with minimal interaction.  It is noted that she had an episode of hypoxia last night.  Detailed face-to-face conference with bedside nursing staff related to patient condition and needs.  Conference with attending, bedside nursing staff, transition of care team related to patient condition, needs, goals of care, disposition.  Plan: At this point continue to treat the treatable but no CPR or intubation.  Time for outcomes.  Discharge delayed due to respiratory issues.  Return to long-term care at [facility], anticipate some short-term rehab.  Outpatient palliative services.  70 minutes Jody Axe, NP Palliative medicine team Team phone [phone] Greater than 50% of this time was spent counseling and coordinating care related to the above assessment and plan.

## 2021-11-03 NOTE — Progress Notes (Signed)
Nsg Discharge Note  Admit Date:  10/27/2021 Discharge date: 11/03/2021   Jody [name] to be D/C'd Nursing Home per MD order.  AVS completed.   Patient/caregiver able to verbalize understanding.  Discharge Medication: Allergies as of 11/03/2021       Reactions   Codeine Nausea And Vomiting        Medication List     STOP taking these medications    predniSONE 20 MG tablet Commonly known as: DELTASONE   predniSONE 50 MG tablet Commonly known as: DELTASONE       TAKE these medications    acetaminophen 325 MG tablet Commonly known as: TYLENOL Take 325 mg by mouth 3 (three) times daily.   albuterol (2.5 MG/3ML) 0.083% nebulizer solution Commonly known as: PROVENTIL Take 2.5 mg by nebulization every 6 (six) hours as needed for wheezing or shortness of breath. What changed: Another medication with the same name was changed. Make sure you understand how and when to take each.   albuterol 108 (90 Base) MCG/ACT inhaler Commonly known as: VENTOLIN HFA Inhale 2 puffs into the lungs every 6 (six) hours as needed for wheezing or shortness of breath. Reported on 03/01/2015 What changed: how much to take   ALPRAZolam 0.25 MG tablet Commonly known as: XANAX Take 1 tablet (0.25 mg total) by mouth 3 (three) times daily as needed.   apixaban 2.5 MG Tabs tablet Commonly known as: ELIQUIS Take 2.5 mg by mouth 2 (two) times daily.   CALAZIME SKIN PROTECTANT EX Apply 1 Application topically 3 (three) times daily. Apply to sacrum and right buttocks every shift until healed   calcitRIOL 0.5 MCG capsule Commonly known as: ROCALTROL Take 1 capsule (0.5 mcg total) by mouth Every Tuesday,Thursday,and Saturday with dialysis.   cefdinir 300 MG capsule Commonly known as: OMNICEF Take 1 capsule (300 mg total) by mouth daily. X 3 days   cinacalcet 30 MG tablet Commonly known as: SENSIPAR Take 1 tablet (30 mg total) by mouth Every Tuesday,Thursday,and Saturday with dialysis.    dextromethorphan-guaiFENesin 30-600 MG 12hr tablet Commonly known as: MUCINEX DM Take 1 tablet by mouth 2 (two) times daily.   doxycycline 100 MG tablet Commonly known as: VIBRA-TABS Take 1 tablet (100 mg total) by mouth every 12 (twelve) hours. X 3 days   feeding supplement (NEPRO CARB STEADY) Liqd Take 237 mLs by mouth 2 (two) times daily between meals.   ferrous sulfate 325 (65 FE) MG tablet Take 325 mg by mouth 2 (two) times daily with a meal.   folic acid 211 MCG tablet Commonly known as: FOLVITE Take 800 mcg by mouth daily.   gabapentin 300 MG capsule Commonly known as: NEURONTIN Take 300 mg by mouth daily.   ipratropium-albuterol 0.5-2.5 (3) MG/3ML Soln Commonly known as: DUONEB Take 3 mLs by nebulization every 4 (four) hours as needed.   lidocaine-prilocaine cream Commonly known as: EMLA Apply 1 Application topically as needed (pain).   metoprolol succinate 25 MG 24 hr tablet Commonly known as: TOPROL-XL Take 0.5 tablets (12.5 mg total) by mouth daily. What changed: how much to take   midodrine 5 MG tablet Commonly known as: PROAMATINE Take 3 tablets (15 mg total) by mouth 3 (three) times daily with meals. What changed:  medication strength how much to take   OXYGEN Inhale into the lungs. May use 2-4 liters   pravastatin 40 MG tablet Commonly known as: PRAVACHOL Take 40 mg by mouth daily.   PreserVision AREDS Tabs Take 1 tablet by mouth  at bedtime.   ProAir Digihaler 108 (90 Base) MCG/ACT Aepb Generic drug: Albuterol Sulfate (sensor) Inhale into the lungs.   sevelamer carbonate 800 MG tablet Commonly known as: RENVELA Take 800 mg by mouth 3 (three) times daily with meals.        Discharge Assessment: Vitals:   11/03/21 1330 11/03/21 1400  BP: 106/86 111/87  Pulse: (!) 59 60  Resp: 18 19  Temp:    SpO2: 94%    Skin clean, dry and intact without evidence of skin break down, no evidence of skin tears noted. IV catheter discontinued  intact. Site without signs and symptoms of complications - no redness or edema noted at insertion site, patient denies c/o pain - only slight tenderness at site.  Dressing with slight pressure applied.  D/c Instructions-Education: Discharge instructions given to patient/family with verbalized understanding. D/c education completed with patient/family including follow up instructions, medication list, d/c activities limitations if indicated, with other d/c instructions as indicated by MD - patient able to verbalize understanding, all questions fully answered. Patient instructed to return to ED, call 911, or call MD for any changes in condition.  Patient escorted via [place], and D/C home via private auto.  Kathie Rhodes, RN 11/03/2021 2:24 PM

## 2021-11-03 NOTE — TOC Transition Note (Signed)
Transition of Care [facility]) - CM/SW Discharge Note   Patient Details  [name] MRN: [id number] Date of Birth: 1940-09-06  Transition of Care [facility]) CM/SW Contact:  Shade Flood, LCSW Phone Number: 11/03/2021, 2:04 PM   Clinical Narrative:     DC was cancelled yesterday. MD states pt stable for dc today. Updated Kristi at [facility] and they can admit pt today. They are aware she will arrive later this evening.   DC clinical sent electronically. RN to call report. EMS requested for 1630 or after.  No other TOC needs for dc.  Final next level of care: Long Term Nursing Home Barriers to Discharge: Barriers Resolved   Patient Goals and CMS Choice Patient states their goals for this hospitalization and ongoing recovery are:: return to facility CMS Medicare.gov Compare Post Acute Care list provided to:: Patient Choice offered to / list presented to : Patient  Discharge Placement                       Discharge Plan and Services In-house Referral: Clinical Social Work   Post Acute Care Choice: Resumption of Svcs/PTA Provider                               Social Determinants of Health (SDOH) Interventions     Readmission Risk Interventions    08/15/2021   11:49 AM 06/07/2021    2:47 PM 06/01/2021   10:59 AM  Readmission Risk Prevention Plan  Transportation Screening Complete Complete Complete  Medication Review [employer])  Complete Complete  PCP or Specialist appointment within 3-5 days of discharge  Complete   HRI or Home Care Consult Complete Complete Complete  SW Recovery Care/Counseling Consult Complete Complete Complete  Palliative Care Screening Not Applicable Complete Not Applicable  Skilled Nursing Facility Complete Complete Complete

## 2021-11-05 ENCOUNTER — Emergency Department (HOSPITAL_COMMUNITY): Payer: Medicare Other

## 2021-11-05 ENCOUNTER — Other Ambulatory Visit: Payer: Self-pay

## 2021-11-05 ENCOUNTER — Encounter (HOSPITAL_COMMUNITY): Payer: Self-pay

## 2021-11-05 ENCOUNTER — Emergency Department (HOSPITAL_COMMUNITY)
Admission: EM | Admit: 2021-11-05 | Discharge: 2021-11-06 | Disposition: A | Discharge status: Skilled Nursing Facility | Payer: Medicare Other | Attending: Emergency Medicine | Admitting: Emergency Medicine

## 2021-11-05 DIAGNOSIS — E1140 Type 2 diabetes mellitus with diabetic neuropathy, unspecified: Secondary | ICD-10-CM | POA: Insufficient documentation

## 2021-11-05 DIAGNOSIS — R0902 Hypoxemia: Secondary | ICD-10-CM | POA: Diagnosis not present

## 2021-11-05 DIAGNOSIS — I959 Hypotension, unspecified: Secondary | ICD-10-CM | POA: Diagnosis not present

## 2021-11-05 DIAGNOSIS — I482 Chronic atrial fibrillation, unspecified: Secondary | ICD-10-CM | POA: Diagnosis not present

## 2021-11-05 DIAGNOSIS — N186 End stage renal disease: Secondary | ICD-10-CM | POA: Insufficient documentation

## 2021-11-05 DIAGNOSIS — Z20822 Contact with and (suspected) exposure to covid-19: Secondary | ICD-10-CM | POA: Diagnosis not present

## 2021-11-05 DIAGNOSIS — I120 Hypertensive chronic kidney disease with stage 5 chronic kidney disease or end stage renal disease: Secondary | ICD-10-CM | POA: Insufficient documentation

## 2021-11-05 DIAGNOSIS — J80 Acute respiratory distress syndrome: Secondary | ICD-10-CM | POA: Diagnosis not present

## 2021-11-05 DIAGNOSIS — Z992 Dependence on renal dialysis: Secondary | ICD-10-CM | POA: Insufficient documentation

## 2021-11-05 DIAGNOSIS — N2581 Secondary hyperparathyroidism of renal origin: Secondary | ICD-10-CM | POA: Diagnosis not present

## 2021-11-05 DIAGNOSIS — E1122 Type 2 diabetes mellitus with diabetic chronic kidney disease: Secondary | ICD-10-CM | POA: Insufficient documentation

## 2021-11-05 DIAGNOSIS — R0602 Shortness of breath: Secondary | ICD-10-CM | POA: Diagnosis not present

## 2021-11-05 DIAGNOSIS — J9600 Acute respiratory failure, unspecified whether with hypoxia or hypercapnia: Secondary | ICD-10-CM | POA: Diagnosis not present

## 2021-11-05 DIAGNOSIS — J449 Chronic obstructive pulmonary disease, unspecified: Secondary | ICD-10-CM | POA: Diagnosis not present

## 2021-11-05 LAB — CBC WITH DIFFERENTIAL/PLATELET
Abs Immature Granulocytes: 0.08 10*3/uL — ABNORMAL HIGH (ref 0.00–0.07)
Basophils Absolute: 0 10*3/uL (ref 0.0–0.1)
Basophils Relative: 0 %
Eosinophils Absolute: 0 10*3/uL (ref 0.0–0.5)
Eosinophils Relative: 0 %
HCT: 51.2 % — ABNORMAL HIGH (ref 36.0–46.0)
Hemoglobin: 14.7 g/dL (ref 12.0–15.0)
Immature Granulocytes: 1 %
Lymphocytes Relative: 11 %
Lymphs Abs: 0.8 10*3/uL (ref 0.7–4.0)
MCH: 32.4 pg (ref 26.0–34.0)
MCHC: 28.7 g/dL — ABNORMAL LOW (ref 30.0–36.0)
MCV: 112.8 fL — ABNORMAL HIGH (ref 80.0–100.0)
Monocytes Absolute: 0.8 10*3/uL (ref 0.1–1.0)
Monocytes Relative: 11 %
Neutro Abs: 5.5 10*3/uL (ref 1.7–7.7)
Neutrophils Relative %: 77 %
Platelets: 110 10*3/uL — ABNORMAL LOW (ref 150–400)
RBC: 4.54 MIL/uL (ref 3.87–5.11)
RDW: 19.4 % — ABNORMAL HIGH (ref 11.5–15.5)
WBC: 7.1 10*3/uL (ref 4.0–10.5)
nRBC: 1 % — ABNORMAL HIGH (ref 0.0–0.2)

## 2021-11-05 LAB — BASIC METABOLIC PANEL
Anion gap: 15 (ref 5–15)
BUN: 39 mg/dL — ABNORMAL HIGH (ref 8–23)
CO2: 30 mmol/L (ref 22–32)
Calcium: 9.9 mg/dL (ref 8.9–10.3)
Chloride: 91 mmol/L — ABNORMAL LOW (ref 98–111)
Creatinine, Ser: 2.95 mg/dL — ABNORMAL HIGH (ref 0.44–1.00)
GFR, Estimated: 15 mL/min — ABNORMAL LOW (ref >60)
Glucose, Bld: 150 mg/dL — ABNORMAL HIGH (ref 70–99)
Potassium: 3.5 mmol/L (ref 3.5–5.1)
Sodium: 136 mmol/L (ref 135–145)

## 2021-11-05 LAB — RESP PANEL BY RT-PCR (FLU A&B, COVID) ARPGX2
Influenza A by PCR: NEGATIVE
Influenza B by PCR: NEGATIVE
SARS Coronavirus 2 by RT PCR: NEGATIVE

## 2021-11-05 NOTE — ED Notes (Signed)
Pt is a hard stick 

## 2021-11-05 NOTE — ED Provider Notes (Signed)
Emergency Department Provider Note   I have reviewed the triage vital signs and the nursing notes.   HISTORY  Chief Complaint Shortness of Breath   HPI Jody [name] is a 81 y.o. female with COPD, ESRD, prior stroke, diabetes presents to the emergency department by EMS with low oxygen at her nursing facility.  According to EMS she apparently had oxygen sats in the 60s when they arrived but very poor waveform.  Patient was in no distress.  She had dialysis this morning.  She is not experiencing pain in her chest or abdomen.  No vomiting.  She is on her home oxygen level with EMS in route with no intervention started. Denies any complaints at this time.   Past Medical History:  Diagnosis Date   Anemia in chronic kidney disease (CODE) 06/05/2015   Arthritis    "bad in my legs" (12/07/2014)   Chronic atrial fibrillation ([place]) 06/05/2015   Chronic back pain    "dr said my spine is crooked"   Chronic bronchitis ([place])    "get it q yr" (74/01/8785)   Complication of anesthesia    headache for 2 days after surgery in May 2019   COPD (chronic obstructive pulmonary disease) ([place])    ESRD on hemodialysis ([place]) 06/05/2015   Gait difficulty 09/02/2014   Hypercholesterolemia    Hypertension    Neuropathy 2015   in legs   NSVT (nonsustained ventricular tachycardia) ([place])    Sinus brady-tachy syndrome ([place])    Stroke ([place]) 05/2014   denies residual on 12/07/2014   Type II diabetes mellitus ([place])     Review of Systems  Constitutional: No fever/chills Eyes: No visual changes. ENT: No sore throat. Cardiovascular: Denies chest pain. Respiratory: Denies shortness of breath. Gastrointestinal: No abdominal pain.  No nausea, no vomiting.  No diarrhea.  No constipation. Genitourinary: Negative for dysuria. Musculoskeletal: Negative for back pain. Skin: Negative for rash. Neurological: Negative for headaches, focal weakness or  numbness.   ____________________________________________   PHYSICAL EXAM:  VITAL SIGNS: ED Triage Vitals  Enc Vitals Group     BP 11/05/21 1656 117/79     Pulse Rate 11/05/21 1656 83     Resp 11/05/21 1656 13     Temp 11/05/21 1656 (!) 97.1 F (36.2 C)     Temp Source 11/05/21 1656 Axillary     SpO2 11/05/21 1646 99 %   Constitutional: Alert and oriented. Well appearing and in no acute distress. Eyes: Conjunctivae are normal.  Head: Atraumatic. Nose: No congestion/rhinnorhea. Mouth/Throat: Mucous membranes are moist.  Neck: No stridor.   Cardiovascular: Normal rate, regular rhythm. Grossly normal heart sounds. AV fistula on the right arm.  Respiratory: Normal respiratory effort.  No retractions. Lungs CTAB. Gastrointestinal: Soft and nontender. No distention.  Neurologic:  Normal speech and language.  Skin:  Skin is warm, dry and intact. No rash noted.  ____________________________________________   LABS (all labs ordered are listed, but only abnormal results are displayed)  Labs Reviewed  BASIC METABOLIC PANEL - Abnormal; Notable for the following components:      Result Value   Chloride 91 (*)    Glucose, Bld 150 (*)    BUN 39 (*)    Creatinine, Ser 2.95 (*)    GFR, Estimated 15 (*)    All other components within normal limits  CBC WITH DIFFERENTIAL/PLATELET - Abnormal; Notable for the following components:   HCT 51.2 (*)    MCV 112.8 (*)    MCHC 28.7 (*)  RDW 19.4 (*)    Platelets 110 (*)    nRBC 1.0 (*)    Abs Immature Granulocytes 0.08 (*)    All other components within normal limits  RESP PANEL BY RT-PCR (FLU A&B, COVID) [id number]  CBC WITH DIFFERENTIAL/PLATELET   ____________________________________________  EKG   EKG Interpretation  Date/Time:  Saturday November 05 2021 16:53:33 EDT Ventricular Rate:  82 PR Interval:    QRS Duration: 130 QT Interval:  380 QTC Calculation: 444 R Axis:   195 Text Interpretation: Atrial fibrillation RBBB and  LPFB Repol abnrm suggests ischemia, diffuse leads Confirmed by Nanda Quinton [phone]) on 11/05/2021 5:21:44 PM        ____________________________________________  RADIOLOGY  DG Chest Portable 1 View  Result Date: 11/05/2021 CLINICAL DATA:  Hypoxia EXAM: PORTABLE CHEST 1 VIEW COMPARISON:  11/02/2021 chest radiograph. FINDINGS: Low lung volumes. Stable cardiomediastinal silhouette with mild cardiomegaly. No pneumothorax. No significant pleural effusions. No overt pulmonary edema. Patchy bibasilar lung opacities, similar. IMPRESSION: 1. Stable cardiomegaly without overt pulmonary edema. 2. Low lung volumes with patchy bibasilar lung opacities, similar, favor atelectasis, with component of aspiration or pneumonia not excluded. Electronically Signed   By: Ilona Sorrel M.D.   On: 11/05/2021 17:53    ____________________________________________   PROCEDURES  Procedure(s) performed:   Procedures  None  ____________________________________________   INITIAL IMPRESSION / ASSESSMENT AND PLAN / ED COURSE  Pertinent labs & imaging results that were available during my care of the patient were reviewed by me and considered in my medical decision making (see chart for details).   This patient is Presenting for Evaluation of low O2, which does require a range of treatment options, and is a complaint that involves a high risk of morbidity and mortality.  The Differential Diagnoses includes CAP, CHF, pulmonary edema from ESRD, COVID, Flu, ACS, PE, etc   I did obtain Additional Historical Information from EMS.  I decided to review pertinent External Data, and in summary patient discharged on 9/21 with shortness of breath and hypoxemia thought to be multifactorial including COPD, anxiety, hypervolemia.  She was discharged home on antibiotics and has continued taking these.   Clinical Laboratory Tests Ordered, included CBC without leukocytosis.  Creatinine of 2.95 similar to prior with no severe  hyperkalemia or severe acidosis.  COVID and flu PCR negative.  Radiologic Tests Ordered, included CXR. I independently interpreted the images and agree with radiology interpretation.   Cardiac Monitor Tracing which shows A fib.    Social Determinants of Health Risk no active smoking.   Medical Decision Making: Summary:  Patient presents emergency department with hypoxemia at her nursing facility.  Overall patient looks very well.  She is speaking in complete sentences.  I do not see indication for BiPAP or other acute airway management.  Plan for screening blood work.  She does not appear acutely volume overloaded.  Had dialysis this morning without complication.  EKG similar to prior values.   Reevaluation with update and discussion with him.  Patient is on her home oxygen here, comfortable, without complaint.  She does not appear particularly volume overloaded and had dialysis run this morning.  Have noticed that her O2 sat is difficult to obtain from her fingers and we are getting better readings by applying the O2 sat to the forehead or earlobe.  No change to outpatient medications or dialysis schedule.   Considered admission looking well with reassuring blood work.  Plan for discharge with strict ED return precautions.   Disposition:  discharge  ____________________________________________  FINAL CLINICAL IMPRESSION(S) / ED DIAGNOSES  Final diagnoses:  Hypoxemia    Note:  This document was prepared using Dragon voice recognition software and may include unintentional dictation errors.  Nanda Quinton, MD, [facility] Emergency Medicine    Kaseem Vastine, [facility], MD 11/05/21 [phone]

## 2021-11-05 NOTE — ED Triage Notes (Signed)
Pt arrived [facility] From [facility] after dialysis completed today and staff could only get oxygen to 61% at 3lpm. Pt arrived with oxygen at 100% on 3 lpm.  Pressure dressing applied to right fistula.

## 2021-11-05 NOTE — Discharge Instructions (Signed)
You were seen in the emergency room today with low oxygen levels.  On arrival to the ED and with EMS her oxygen levels were normal on her home oxygen.  We repeated a chest x-ray which is unchanged and lab work which is reassuring.  Please continue your regularly scheduled dialysis and complete any medications provided at discharge from your recent hospitalization.  We do notice that it is sometimes difficult to pick up an accurate oxygen reading from your fingers and you may have to measure your oxygen from a sensor to the forehead or earlobe for more accurate numbers.

## 2021-11-05 NOTE — ED Notes (Signed)
Convo called 

## 2021-11-08 DIAGNOSIS — I4891 Unspecified atrial fibrillation: Secondary | ICD-10-CM | POA: Diagnosis not present

## 2021-11-08 DIAGNOSIS — N2581 Secondary hyperparathyroidism of renal origin: Secondary | ICD-10-CM | POA: Diagnosis not present

## 2021-11-08 DIAGNOSIS — Z299 Encounter for prophylactic measures, unspecified: Secondary | ICD-10-CM | POA: Diagnosis not present

## 2021-11-08 DIAGNOSIS — Z992 Dependence on renal dialysis: Secondary | ICD-10-CM | POA: Diagnosis not present

## 2021-11-08 DIAGNOSIS — I70229 Atherosclerosis of native arteries of extremities with rest pain, unspecified extremity: Secondary | ICD-10-CM | POA: Diagnosis not present

## 2021-11-08 DIAGNOSIS — N186 End stage renal disease: Secondary | ICD-10-CM | POA: Diagnosis not present

## 2021-11-08 DIAGNOSIS — I700 Atherosclerosis of aorta: Secondary | ICD-10-CM | POA: Diagnosis not present

## 2021-11-08 DIAGNOSIS — I5033 Acute on chronic diastolic (congestive) heart failure: Secondary | ICD-10-CM | POA: Diagnosis not present

## 2021-11-10 DIAGNOSIS — N2581 Secondary hyperparathyroidism of renal origin: Secondary | ICD-10-CM | POA: Diagnosis not present

## 2021-11-10 DIAGNOSIS — Z992 Dependence on renal dialysis: Secondary | ICD-10-CM | POA: Diagnosis not present

## 2021-11-10 DIAGNOSIS — N186 End stage renal disease: Secondary | ICD-10-CM | POA: Diagnosis not present

## 2021-11-12 DIAGNOSIS — N2581 Secondary hyperparathyroidism of renal origin: Secondary | ICD-10-CM | POA: Diagnosis not present

## 2021-11-12 DIAGNOSIS — N186 End stage renal disease: Secondary | ICD-10-CM | POA: Diagnosis not present

## 2021-11-12 DIAGNOSIS — Z992 Dependence on renal dialysis: Secondary | ICD-10-CM | POA: Diagnosis not present

## 2021-11-15 DIAGNOSIS — Z992 Dependence on renal dialysis: Secondary | ICD-10-CM | POA: Diagnosis not present

## 2021-11-15 DIAGNOSIS — N186 End stage renal disease: Secondary | ICD-10-CM | POA: Diagnosis not present

## 2021-11-15 DIAGNOSIS — N2581 Secondary hyperparathyroidism of renal origin: Secondary | ICD-10-CM | POA: Diagnosis not present

## 2021-11-17 ENCOUNTER — Inpatient Hospital Stay (HOSPITAL_COMMUNITY): Payer: Medicare Other

## 2021-11-17 ENCOUNTER — Emergency Department (HOSPITAL_COMMUNITY): Payer: Medicare Other

## 2021-11-17 ENCOUNTER — Other Ambulatory Visit: Payer: Self-pay

## 2021-11-17 ENCOUNTER — Encounter (HOSPITAL_COMMUNITY): Payer: Self-pay

## 2021-11-17 ENCOUNTER — Inpatient Hospital Stay (HOSPITAL_COMMUNITY)
Admission: EM | Admit: 2021-11-17 | Discharge: 2021-12-14 | DRG: 871 | Disposition: E | Payer: Medicare Other | Attending: Internal Medicine | Admitting: Internal Medicine

## 2021-11-17 DIAGNOSIS — D631 Anemia in chronic kidney disease: Secondary | ICD-10-CM | POA: Diagnosis not present

## 2021-11-17 DIAGNOSIS — R579 Shock, unspecified: Principal | ICD-10-CM

## 2021-11-17 DIAGNOSIS — Z811 Family history of alcohol abuse and dependence: Secondary | ICD-10-CM

## 2021-11-17 DIAGNOSIS — I132 Hypertensive heart and chronic kidney disease with heart failure and with stage 5 chronic kidney disease, or end stage renal disease: Secondary | ICD-10-CM | POA: Diagnosis not present

## 2021-11-17 DIAGNOSIS — F05 Delirium due to known physiological condition: Secondary | ICD-10-CM | POA: Diagnosis present

## 2021-11-17 DIAGNOSIS — T68XXXA Hypothermia, initial encounter: Secondary | ICD-10-CM | POA: Diagnosis present

## 2021-11-17 DIAGNOSIS — Z515 Encounter for palliative care: Secondary | ICD-10-CM | POA: Diagnosis not present

## 2021-11-17 DIAGNOSIS — E1151 Type 2 diabetes mellitus with diabetic peripheral angiopathy without gangrene: Secondary | ICD-10-CM | POA: Diagnosis present

## 2021-11-17 DIAGNOSIS — I471 Supraventricular tachycardia, unspecified: Secondary | ICD-10-CM | POA: Diagnosis present

## 2021-11-17 DIAGNOSIS — I5043 Acute on chronic combined systolic (congestive) and diastolic (congestive) heart failure: Secondary | ICD-10-CM | POA: Diagnosis not present

## 2021-11-17 DIAGNOSIS — Z8249 Family history of ischemic heart disease and other diseases of the circulatory system: Secondary | ICD-10-CM

## 2021-11-17 DIAGNOSIS — E1122 Type 2 diabetes mellitus with diabetic chronic kidney disease: Secondary | ICD-10-CM | POA: Diagnosis present

## 2021-11-17 DIAGNOSIS — Z89422 Acquired absence of other left toe(s): Secondary | ICD-10-CM

## 2021-11-17 DIAGNOSIS — E875 Hyperkalemia: Secondary | ICD-10-CM | POA: Diagnosis not present

## 2021-11-17 DIAGNOSIS — R4189 Other symptoms and signs involving cognitive functions and awareness: Secondary | ICD-10-CM | POA: Diagnosis present

## 2021-11-17 DIAGNOSIS — Z992 Dependence on renal dialysis: Secondary | ICD-10-CM | POA: Diagnosis not present

## 2021-11-17 DIAGNOSIS — I2489 Other forms of acute ischemic heart disease: Secondary | ICD-10-CM | POA: Diagnosis not present

## 2021-11-17 DIAGNOSIS — Z7401 Bed confinement status: Secondary | ICD-10-CM

## 2021-11-17 DIAGNOSIS — J9602 Acute respiratory failure with hypercapnia: Secondary | ICD-10-CM | POA: Diagnosis not present

## 2021-11-17 DIAGNOSIS — Z7189 Other specified counseling: Secondary | ICD-10-CM

## 2021-11-17 DIAGNOSIS — D6959 Other secondary thrombocytopenia: Secondary | ICD-10-CM | POA: Diagnosis not present

## 2021-11-17 DIAGNOSIS — D539 Nutritional anemia, unspecified: Secondary | ICD-10-CM | POA: Diagnosis present

## 2021-11-17 DIAGNOSIS — I9589 Other hypotension: Secondary | ICD-10-CM | POA: Diagnosis present

## 2021-11-17 DIAGNOSIS — Z79899 Other long term (current) drug therapy: Secondary | ICD-10-CM

## 2021-11-17 DIAGNOSIS — Z7901 Long term (current) use of anticoagulants: Secondary | ICD-10-CM

## 2021-11-17 DIAGNOSIS — G9341 Metabolic encephalopathy: Secondary | ICD-10-CM | POA: Diagnosis not present

## 2021-11-17 DIAGNOSIS — R001 Bradycardia, unspecified: Secondary | ICD-10-CM | POA: Diagnosis not present

## 2021-11-17 DIAGNOSIS — E669 Obesity, unspecified: Secondary | ICD-10-CM | POA: Diagnosis present

## 2021-11-17 DIAGNOSIS — Z885 Allergy status to narcotic agent status: Secondary | ICD-10-CM

## 2021-11-17 DIAGNOSIS — Z66 Do not resuscitate: Secondary | ICD-10-CM | POA: Diagnosis not present

## 2021-11-17 DIAGNOSIS — N2581 Secondary hyperparathyroidism of renal origin: Secondary | ICD-10-CM | POA: Diagnosis not present

## 2021-11-17 DIAGNOSIS — E871 Hypo-osmolality and hyponatremia: Secondary | ICD-10-CM | POA: Diagnosis not present

## 2021-11-17 DIAGNOSIS — N186 End stage renal disease: Secondary | ICD-10-CM | POA: Diagnosis not present

## 2021-11-17 DIAGNOSIS — Z87891 Personal history of nicotine dependence: Secondary | ICD-10-CM

## 2021-11-17 DIAGNOSIS — N250 Renal osteodystrophy: Secondary | ICD-10-CM | POA: Diagnosis not present

## 2021-11-17 DIAGNOSIS — R6521 Severe sepsis with septic shock: Secondary | ICD-10-CM | POA: Diagnosis not present

## 2021-11-17 DIAGNOSIS — R627 Adult failure to thrive: Secondary | ICD-10-CM | POA: Diagnosis not present

## 2021-11-17 DIAGNOSIS — R531 Weakness: Secondary | ICD-10-CM | POA: Diagnosis not present

## 2021-11-17 DIAGNOSIS — M1990 Unspecified osteoarthritis, unspecified site: Secondary | ICD-10-CM | POA: Diagnosis present

## 2021-11-17 DIAGNOSIS — J440 Chronic obstructive pulmonary disease with acute lower respiratory infection: Secondary | ICD-10-CM | POA: Diagnosis not present

## 2021-11-17 DIAGNOSIS — I4819 Other persistent atrial fibrillation: Secondary | ICD-10-CM | POA: Diagnosis present

## 2021-11-17 DIAGNOSIS — E7800 Pure hypercholesterolemia, unspecified: Secondary | ICD-10-CM | POA: Diagnosis present

## 2021-11-17 DIAGNOSIS — Z96651 Presence of right artificial knee joint: Secondary | ICD-10-CM | POA: Diagnosis present

## 2021-11-17 DIAGNOSIS — I10 Essential (primary) hypertension: Secondary | ICD-10-CM | POA: Diagnosis not present

## 2021-11-17 DIAGNOSIS — R0689 Other abnormalities of breathing: Secondary | ICD-10-CM | POA: Diagnosis not present

## 2021-11-17 DIAGNOSIS — G8929 Other chronic pain: Secondary | ICD-10-CM | POA: Diagnosis present

## 2021-11-17 DIAGNOSIS — J9622 Acute and chronic respiratory failure with hypercapnia: Secondary | ICD-10-CM | POA: Diagnosis present

## 2021-11-17 DIAGNOSIS — J9621 Acute and chronic respiratory failure with hypoxia: Secondary | ICD-10-CM | POA: Diagnosis not present

## 2021-11-17 DIAGNOSIS — J181 Lobar pneumonia, unspecified organism: Secondary | ICD-10-CM | POA: Diagnosis present

## 2021-11-17 DIAGNOSIS — I4510 Unspecified right bundle-branch block: Secondary | ICD-10-CM | POA: Diagnosis present

## 2021-11-17 DIAGNOSIS — Z6830 Body mass index (BMI) 30.0-30.9, adult: Secondary | ICD-10-CM

## 2021-11-17 DIAGNOSIS — J449 Chronic obstructive pulmonary disease, unspecified: Secondary | ICD-10-CM | POA: Diagnosis not present

## 2021-11-17 DIAGNOSIS — R404 Transient alteration of awareness: Secondary | ICD-10-CM | POA: Diagnosis not present

## 2021-11-17 DIAGNOSIS — A419 Sepsis, unspecified organism: Secondary | ICD-10-CM | POA: Diagnosis not present

## 2021-11-17 DIAGNOSIS — M549 Dorsalgia, unspecified: Secondary | ICD-10-CM | POA: Diagnosis present

## 2021-11-17 DIAGNOSIS — J9811 Atelectasis: Secondary | ICD-10-CM | POA: Diagnosis not present

## 2021-11-17 DIAGNOSIS — I4891 Unspecified atrial fibrillation: Secondary | ICD-10-CM | POA: Diagnosis not present

## 2021-11-17 DIAGNOSIS — R1310 Dysphagia, unspecified: Secondary | ICD-10-CM | POA: Diagnosis present

## 2021-11-17 DIAGNOSIS — I469 Cardiac arrest, cause unspecified: Secondary | ICD-10-CM | POA: Diagnosis not present

## 2021-11-17 DIAGNOSIS — Z9981 Dependence on supplemental oxygen: Secondary | ICD-10-CM

## 2021-11-17 DIAGNOSIS — R54 Age-related physical debility: Secondary | ICD-10-CM | POA: Diagnosis present

## 2021-11-17 DIAGNOSIS — E1140 Type 2 diabetes mellitus with diabetic neuropathy, unspecified: Secondary | ICD-10-CM | POA: Diagnosis present

## 2021-11-17 DIAGNOSIS — Z803 Family history of malignant neoplasm of breast: Secondary | ICD-10-CM

## 2021-11-17 DIAGNOSIS — I959 Hypotension, unspecified: Secondary | ICD-10-CM | POA: Diagnosis not present

## 2021-11-17 DIAGNOSIS — E8770 Fluid overload, unspecified: Secondary | ICD-10-CM | POA: Diagnosis not present

## 2021-11-17 DIAGNOSIS — Z8673 Personal history of transient ischemic attack (TIA), and cerebral infarction without residual deficits: Secondary | ICD-10-CM

## 2021-11-17 LAB — COMPREHENSIVE METABOLIC PANEL
ALT: 19 U/L (ref 0–44)
AST: 20 U/L (ref 15–41)
Albumin: 3.2 g/dL — ABNORMAL LOW (ref 3.5–5.0)
Alkaline Phosphatase: 148 U/L — ABNORMAL HIGH (ref 38–126)
Anion gap: 9 (ref 5–15)
BUN: 44 mg/dL — ABNORMAL HIGH (ref 8–23)
CO2: 29 mmol/L (ref 22–32)
Calcium: 8.9 mg/dL (ref 8.9–10.3)
Chloride: 94 mmol/L — ABNORMAL LOW (ref 98–111)
Creatinine, Ser: 3.3 mg/dL — ABNORMAL HIGH (ref 0.44–1.00)
GFR, Estimated: 13 mL/min — ABNORMAL LOW (ref >60)
Glucose, Bld: 170 mg/dL — ABNORMAL HIGH (ref 70–99)
Potassium: 4.5 mmol/L (ref 3.5–5.1)
Sodium: 132 mmol/L — ABNORMAL LOW (ref 135–145)
Total Bilirubin: 1.1 mg/dL (ref 0.3–1.2)
Total Protein: 5.9 g/dL — ABNORMAL LOW (ref 6.5–8.1)

## 2021-11-17 LAB — BLOOD GAS, ARTERIAL
Acid-base deficit: 2.5 mmol/L — ABNORMAL HIGH (ref 0.0–2.0)
Bicarbonate: 29.8 mmol/L — ABNORMAL HIGH (ref 20.0–28.0)
Drawn by: 23430
FIO2: 100 %
O2 Saturation: 100 %
Patient temperature: 37
pCO2 arterial: 96 mmHg (ref 32–48)
pH, Arterial: 7.1 — CL (ref 7.35–7.45)
pO2, Arterial: 190 mmHg — ABNORMAL HIGH (ref 83–108)

## 2021-11-17 LAB — CBC WITH DIFFERENTIAL/PLATELET
Abs Immature Granulocytes: 0.02 10*3/uL (ref 0.00–0.07)
Basophils Absolute: 0 10*3/uL (ref 0.0–0.1)
Basophils Relative: 1 %
Eosinophils Absolute: 0.1 10*3/uL (ref 0.0–0.5)
Eosinophils Relative: 2 %
HCT: 36 % (ref 36.0–46.0)
Hemoglobin: 10.3 g/dL — ABNORMAL LOW (ref 12.0–15.0)
Immature Granulocytes: 1 %
Lymphocytes Relative: 10 %
Lymphs Abs: 0.4 10*3/uL — ABNORMAL LOW (ref 0.7–4.0)
MCH: 32.3 pg (ref 26.0–34.0)
MCHC: 28.6 g/dL — ABNORMAL LOW (ref 30.0–36.0)
MCV: 112.9 fL — ABNORMAL HIGH (ref 80.0–100.0)
Monocytes Absolute: 0.4 10*3/uL (ref 0.1–1.0)
Monocytes Relative: 10 %
Neutro Abs: 3 10*3/uL (ref 1.7–7.7)
Neutrophils Relative %: 76 %
Platelets: 87 10*3/uL — ABNORMAL LOW (ref 150–400)
RBC: 3.19 MIL/uL — ABNORMAL LOW (ref 3.87–5.11)
RDW: 18 % — ABNORMAL HIGH (ref 11.5–15.5)
WBC: 3.9 10*3/uL — ABNORMAL LOW (ref 4.0–10.5)
nRBC: 0 % (ref 0.0–0.2)

## 2021-11-17 LAB — LACTIC ACID, PLASMA
Lactic Acid, Venous: 1 mmol/L (ref 0.5–1.9)
Lactic Acid, Venous: 1 mmol/L (ref 0.5–1.9)

## 2021-11-17 LAB — TROPONIN I (HIGH SENSITIVITY): Troponin I (High Sensitivity): 45 ng/L — ABNORMAL HIGH (ref <18)

## 2021-11-17 LAB — PROTIME-INR
INR: 1.3 — ABNORMAL HIGH (ref 0.8–1.2)
Prothrombin Time: 16.1 seconds — ABNORMAL HIGH (ref 11.4–15.2)

## 2021-11-17 LAB — PROCALCITONIN: Procalcitonin: 0.74 ng/mL

## 2021-11-17 LAB — CBG MONITORING, ED: Glucose-Capillary: 162 mg/dL — ABNORMAL HIGH (ref 70–99)

## 2021-11-17 MED ORDER — PIPERACILLIN-TAZOBACTAM IN DEX 2-0.25 GM/50ML IV SOLN
2.2500 g | Freq: Three times a day (TID) | INTRAVENOUS | Status: DC
  Administered 2021-11-18 (×2): 2.25 g via INTRAVENOUS
  Filled 2021-11-17 (×5): qty 50

## 2021-11-17 MED ORDER — VANCOMYCIN HCL IN DEXTROSE 1-5 GM/200ML-% IV SOLN
1000.0000 mg | Freq: Once | INTRAVENOUS | Status: AC
  Administered 2021-11-17: 1000 mg via INTRAVENOUS
  Filled 2021-11-17: qty 200

## 2021-11-17 MED ORDER — PIPERACILLIN-TAZOBACTAM 3.375 G IVPB 30 MIN
3.3750 g | Freq: Once | INTRAVENOUS | Status: AC
  Administered 2021-11-17: 3.375 g via INTRAVENOUS
  Filled 2021-11-17: qty 50

## 2021-11-17 MED ORDER — NOREPINEPHRINE 4 MG/250ML-% IV SOLN
0.0000 ug/min | INTRAVENOUS | Status: DC
  Administered 2021-11-17: 2 ug/min via INTRAVENOUS
  Administered 2021-11-17: 10 ug/min via INTRAVENOUS
  Administered 2021-11-18: 9 ug/min via INTRAVENOUS
  Administered 2021-11-18: 6 ug/min via INTRAVENOUS
  Administered 2021-11-19: 3 ug/min via INTRAVENOUS
  Administered 2021-11-19: 6 ug/min via INTRAVENOUS
  Filled 2021-11-17 (×6): qty 250

## 2021-11-17 MED ORDER — VANCOMYCIN VARIABLE DOSE PER UNSTABLE RENAL FUNCTION (PHARMACIST DOSING): Status: DC

## 2021-11-17 MED ORDER — SODIUM CHLORIDE 0.9 % IV BOLUS
1000.0000 mL | Freq: Once | INTRAVENOUS | Status: AC
  Administered 2021-11-17: 1000 mL via INTRAVENOUS

## 2021-11-17 NOTE — ED Notes (Signed)
Pt placed on cardiac monitor, pt a fib,

## 2021-11-17 NOTE — ED Provider Notes (Signed)
[facility] EMERGENCY DEPARTMENT Provider Note   CSN: [id number] Arrival date & time: 11/16/2021  1056     History  No chief complaint on file.   Jody [name] is a 81 y.o. female.  HPI   This patient is an 81 year old female, she has end-stage renal disease on dialysis, she is currently on Eliquis, she also takes medications including metoprolol, midodrine, pravastatin.  She was at dialysis today, after short amount of dialysis she was found to be hypotensive and altered, dialysis was immediately stopped, she had less than 25 minutes of dialysis, she was transported to the hospital for further evaluation.  The patient is not able answer my questions, she mumbles one-word nonsensical answers.  No further information is available at this time  Spoke with Jackelyn Poling, the patient's nurse at her nursing facility who reports that this morning she was her normal self prior to going to dialysis.     Home Medications Prior to Admission medications   Medication Sig Start Date End Date Taking? Authorizing Provider  acetaminophen (TYLENOL) 325 MG tablet Take 325 mg by mouth 3 (three) times daily.    [provider]  albuterol (PROVENTIL) (2.5 MG/3ML) 0.083% nebulizer solution Take 2.5 mg by nebulization every 6 (six) hours as needed for wheezing or shortness of breath.    [provider]  albuterol (VENTOLIN HFA) 108 (90 Base) MCG/ACT inhaler Inhale 2 puffs into the lungs every 6 (six) hours as needed for wheezing or shortness of breath. Reported on 03/01/2015 Patient taking differently: Inhale 1-2 puffs into the lungs every 6 (six) hours as needed for wheezing or shortness of breath. Reported on 03/01/2015 07/30/21   Roxan Hockey, MD  ALPRAZolam Duanne Moron) 0.25 MG tablet Take 1 tablet (0.25 mg total) by mouth 3 (three) times daily as needed. Patient taking differently: Take 0.25 mg by mouth 3 (three) times daily as needed for anxiety. 11/02/21   Orson Eva, MD  apixaban (ELIQUIS)  2.5 MG TABS tablet Take 2.5 mg by mouth 2 (two) times daily.    [provider]  calcitRIOL (ROCALTROL) 0.5 MCG capsule Take 1 capsule (0.5 mcg total) by mouth Every Tuesday,Thursday,and Saturday with dialysis. 10/25/21   Orson Eva, MD  cefdinir (OMNICEF) 300 MG capsule Take 1 capsule (300 mg total) by mouth daily. X 3 days 11/03/21   Orson Eva, MD  cinacalcet (SENSIPAR) 30 MG tablet Take 1 tablet (30 mg total) by mouth Every Tuesday,Thursday,and Saturday with dialysis. Patient not taking: Reported on 10/27/2021 10/25/21   Orson Eva, MD  dextromethorphan-guaiFENesin [facility]) 30-600 MG 12hr tablet Take 1 tablet by mouth 2 (two) times daily. 07/30/21   Roxan Hockey, MD  doxycycline (VIBRA-TABS) 100 MG tablet Take 1 tablet (100 mg total) by mouth every 12 (twelve) hours. X 3 days 11/03/21   Orson Eva, MD  ferrous sulfate 325 (65 FE) MG tablet Take 325 mg by mouth 2 (two) times daily with a meal.    [provider]  folic acid (FOLVITE) 379 MCG tablet Take 800 mcg by mouth daily.    [provider]  gabapentin (NEURONTIN) 300 MG capsule Take 300 mg by mouth daily. 05/11/21   [provider]  ipratropium-albuterol (DUONEB) 0.5-2.5 (3) MG/3ML SOLN Take 3 mLs by nebulization every 4 (four) hours as needed. 10/24/21   Orson Eva, MD  lidocaine-prilocaine (EMLA) cream Apply 1 Application topically as needed (pain). Patient not taking: Reported on 11/05/2021    [provider]  metoprolol succinate (TOPROL-XL) 25 MG  24 hr tablet Take 0.5 tablets (12.5 mg total) by mouth daily. 11/03/21   Orson Eva, MD  midodrine (PROAMATINE) 5 MG tablet Take 3 tablets (15 mg total) by mouth 3 (three) times daily with meals. Patient taking differently: Take 5 mg by mouth 3 (three) times daily with meals. 11/02/21   Orson Eva, MD  Multiple Vitamins-Minerals (PRESERVISION AREDS) TABS Take 1 tablet by mouth at bedtime.    [provider]  Nutritional Supplements (FEEDING  SUPPLEMENT, NEPRO CARB STEADY,) LIQD Take 237 mLs by mouth 2 (two) times daily between meals.    [provider]  OXYGEN Inhale into the lungs. May use 2-4 liters    [provider]  pravastatin (PRAVACHOL) 40 MG tablet Take 40 mg by mouth daily. 09/28/21   [provider]  predniSONE (DELTASONE) 10 MG tablet Take 10 mg by mouth 2 (two) times daily. Patient not taking: Reported on 11/05/2021 08/01/21   [provider]  PROAIR DIGIHALER 108 [phone] Base) MCG/ACT AEPB Inhale into the lungs. 10/27/21   [provider]  sevelamer carbonate (RENVELA) 800 MG tablet Take 800 mg by mouth 3 (three) times daily with meals.  07/22/19   [provider]  Skin Protectants, Misc. (CALAZIME SKIN PROTECTANT EX) Apply 1 Application topically 3 (three) times daily. Apply to sacrum and right buttocks every shift until healed Patient not taking: Reported on 11/05/2021    [provider]      Allergies    Codeine    Review of Systems   Review of Systems  Unable to perform ROS: Mental status change    Physical Exam Updated Vital Signs There were no vitals taken for this visit. Physical Exam Vitals and nursing note reviewed.  Constitutional:      Appearance: She is well-developed. She is ill-appearing. She is not diaphoretic.     Comments: Somnolent but arousable to loud voice  HENT:     Head: Normocephalic and atraumatic.     Mouth/Throat:     Mouth: Mucous membranes are dry.     Pharynx: No oropharyngeal exudate.  Eyes:     General: No scleral icterus.       Right eye: No discharge.        Left eye: No discharge.     Conjunctiva/sclera: Conjunctivae normal.     Pupils: Pupils are equal, round, and reactive to light.  Neck:     Thyroid: No thyromegaly.     Vascular: No JVD.     Comments: JVD present Cardiovascular:     Rate and Rhythm: Normal rate and regular rhythm.     Heart sounds: Normal heart sounds. No murmur heard.    No friction rub.  No gallop.  Pulmonary:     Effort: Pulmonary effort is normal. No respiratory distress.     Breath sounds: Normal breath sounds. No wheezing or rales.  Abdominal:     General: Bowel sounds are normal. There is no distension.     Palpations: Abdomen is soft. There is no mass.     Tenderness: There is no abdominal tenderness.  Musculoskeletal:        General: No tenderness. Normal range of motion.     Cervical back: Normal range of motion and neck supple.     Right lower leg: Edema present.     Left lower leg: Edema present.     Comments: Intraosseous line placed in the left anterior proximal tibia by paramedic still in place  Lymphadenopathy:  Cervical: No cervical adenopathy.  Skin:    General: Skin is warm and dry.     Findings: No erythema or rash.  Neurological:     Coordination: Coordination normal.     Comments: Patient seems to be able to move all 4 extremities to pain minimally, she is able to open her eyes and mumbles one-word answers, diffusely weak  Psychiatric:        Behavior: Behavior normal.     ED Results / Procedures / Treatments   Labs (all labs ordered are listed, but only abnormal results are displayed) Labs Reviewed - No data to display  EKG None  Radiology No results found.  Procedures .Critical Care  Performed by: Noemi Chapel, MD Authorized by: Noemi Chapel, MD   Critical care provider statement:    Critical care time (minutes):  30   Critical care time was exclusive of:  Separately billable procedures and treating other patients and teaching time   Critical care was necessary to treat or prevent imminent or life-threatening deterioration of the following conditions:  Shock   Critical care was time spent personally by me on the following activities:  Development of treatment plan with patient or surrogate, discussions with consultants, evaluation of patient's response to treatment, examination of patient, ordering and review of laboratory  studies, ordering and review of radiographic studies, ordering and performing treatments and interventions, pulse oximetry, re-evaluation of patient's condition, review of old charts and obtaining history from patient or surrogate   I assumed direction of critical care for this patient from another provider in my specialty: no     Care discussed with: admitting provider   Comments:       .Central Line  Date/Time: 11/18/2021 12:50 PM  Performed by: Noemi Chapel, MD Authorized by: Noemi Chapel, MD   Consent:    Consent obtained:  Emergent situation Universal protocol:    Site/side marked: yes     Immediately prior to procedure, a time out was called: yes     Patient identity confirmed:  Arm band Pre-procedure details:    Indication(s): central venous access and insufficient peripheral access     Hand hygiene: Hand hygiene performed prior to insertion     Sterile barrier technique: All elements of maximal sterile technique followed     Skin preparation:  Chlorhexidine   Skin preparation agent: Skin preparation agent completely dried prior to procedure   Sedation:    Sedation type:  None Anesthesia:    Anesthesia method:  Local infiltration   Local anesthetic:  Lidocaine 1% w/o epi Procedure details:    Location:  R femoral   Patient position:  Supine   Procedural supplies:  Triple lumen   Catheter size:  7 Fr   Landmarks identified: yes     Ultrasound guidance: no     Number of attempts:  1   Successful placement: yes   Post-procedure details:    Post-procedure:  Dressing applied and line sutured   Assessment:  Blood return through all ports and free fluid flow   Procedure completion:  Tolerated well, no immediate complications Comments:           Medications Ordered in ED Medications - No data to display  ED Course/ Medical Decision Making/ A&P                           Medical Decision Making Amount and/or Complexity of Data Reviewed Labs: ordered. Radiology:  ordered.  Risk Prescription drug management. Decision regarding hospitalization.   This patient presents to the ED for concern of hypotension, this involves an extensive number of treatment options, and is a complaint that carries with it a high risk of complications and morbidity.  The differential diagnosis includes sepsis, dialysis related hypotension, evidently the patient is some hypotension at baseline since she is on midodrine, could be related to hemorrhage, intracranial abnormalities, the patient is on Eliquis, could be anemic   Co morbidities that complicate the patient evaluation  End-stage renal disease   Additional history obtained:  Additional history obtained from paramedics External records from outside source obtained and reviewed including prehospital record, EKG did not show any specific arrhythmias, intraosseous line placed Patient has had multiple admissions to the hospital over the last several months usually related to respiratory failure and some degree Most recently admitted in the middle of September approximately 3 weeks ago   Lab Tests:  I Ordered, and personally interpreted labs.  The pertinent results include: Lactic acid of 1.2, procalcitonin of 0.7, ABG showed acidosis with a pH of 7.1 and a CO2 of 96 consistent with a hypercapnic respiratory failure.  Mild hyponatremia, baseline renal dysfunction, slight leukopenia with an increased anemia in fact in the last 2 weeks the patient's hemoglobin is dropped from 14 down to 10, troponin was 45 but the patient has a chronically elevated troponin.   Imaging Studies ordered:  I ordered imaging studies including chest x-ray and CT scan of the brain I independently visualized and interpreted imaging which showed no acute findings I agree with the radiologist interpretation   Cardiac Monitoring: / EKG:  The patient was maintained on a cardiac monitor.  I personally viewed and interpreted the cardiac monitored  which showed an underlying rhythm of: Borderline tachycardia   Consultations Obtained:  I requested consultation with the hospitalist,  and discussed lab and imaging findings as well as pertinent plan - they recommend: Admission to the ICU and request a central line be placed.  I did place an external jugular peripheral IV with an 18-gauge on the left side of the patient's neck on arrival and she had poor peripheral access, I then placed a central line in the right femoral vein for further resuscitative efforts as the patient remained hypotensive   Problem List / ED Course / Critical interventions / Medication management  Due to the patient's shock state and hypercapnia BiPAP was ordered, multiple fluid boluses were ordered and antibiotics were ultimately given, including Zosyn and vancomycin. I ordered medication including antibiotics and IV fluids for unexplained shock Ultimately the patient was found to be hypothermic and required Bair hugger heating externally as well Reevaluation of the patient after these medicines showed that the patient slightly improved but the patient requires intensive care unit stay I have reviewed the patients home medicines and have made adjustments as needed   Social Determinants of Health:  Dialysis patient, nursing home patient   Test / Admission - Considered:  Admitted to hospitalist, appreciate Dr. Carles Collet and his willingness to care for this critically ill patient in the ICU           Final Clinical Impression(s) / ED Diagnoses Final diagnoses:  Shock ([place])  Hypothermia, initial encounter  Acute respiratory failure with hypercapnia ([place])     Noemi Chapel, MD 11/18/21 1253

## 2021-11-17 NOTE — ED Notes (Signed)
Respiratory called for BiPap
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
## 2021-11-17 NOTE — Consult Note (Signed)
Consultation Note Date: 12/13/2021   Patient [name]  DOB: 10-18-1940  MRN: [id number]  Age / Sex: 81 y.o., female  PCP: Monico Blitz, MD Referring Physician: Orson Eva, MD  Reason for Consultation: Establishing goals of care  HPI/Patient Profile: 81 y.o. female  with past medical history of stroke, ESRD on HD, COPD on 3L oxygen, CHF, atrial fib, PAD, recent left great toe osteomyelitis s/p amputation, anemia of chronic disease, diabetes admitted on 11/14/2021 with altered mental status related to septic shock likely pneumonia with acute on chronic respiratory failure.   Clinical Assessment and Goals of Care: I met today at Ms. Kasparian but bedside but no family present at bedside. I have reviewed records from current and past hospitalizations including previous palliative consultations. I began palliative conversations with niece/HCPOA [name] back in April 2023. Unfortunately Ms. Veith has continued to decline with significant health decline over the past 1-2 months. Ms. Kaufman is on BiPAP and is not responsive during my examination.   I called and spoke with [name]. [name] and I reviewed Ms. Navejas' declining health over the past months. [name] acknowledges the struggles of trying to make decisions for her aunt while also caring for her mother. However, [name] understands that Ms. Peckinpaugh has continued to decline and that she has been suffering. We reviewed how the physical body can only go through so much. We reviewed the struggles of dialysis and how Ms. Michalski is not currently in a position to tolerate dialysis treatment. [name] and I discussed preparation for path forward in knowing that she has honored Ms. Paone' wishes to give her every opportunity for improvement but knowing we are at the stage now to offer her comfort at end of life. [name] will be prepared for transition to comfort care  tomorrow if no significant improvement. I discussed with [name] that we can offer Ms. Walts relief from pain and shortness of breath to allow her rest and peace at end of life. I acknowledged the difficulty in making these decisions and the weight this carries. [name] will plan to visit this evening and bring her mother recognizing that Ms. Igarashi is unlikely to survive much longer.  All questions/concerns addressed. Emotional support provided. Updated Dr. Carles Collet.   Primary Decision Maker HCPOA niece [name]    SUMMARY OF RECOMMENDATIONS   - DNR - Transition to comfort if no significant improvement by tomorrow (10/6)  Code Status/Advance Care Planning: DNR   Symptom Management:  Per attending, PCCM  Prognosis:  Overall prognosis poor. With anticipate transition to comfort measures soon likely hours to days prognosis.   Discharge Planning: Likely hospital death.       Primary Diagnoses: Present on Admission:  Sepsis due to undetermined organism [facility])  Persistent atrial fibrillation ([place])   I have reviewed the medical record, interviewed the patient and family, and examined the patient. The following aspects are pertinent.  Past Medical History:  Diagnosis Date   Anemia in chronic kidney disease (CODE) 06/05/2015   Arthritis    "bad in  my legs" (12/07/2014)   Chronic atrial fibrillation ([place]) 06/05/2015   Chronic back pain    "dr said my spine is crooked"   Chronic bronchitis ([place])    "get it q yr" (35/36/1443)   Complication of anesthesia    headache for 2 days after surgery in May 2019   COPD (chronic obstructive pulmonary disease) ([place])    ESRD on hemodialysis ([place]) 06/05/2015   Gait difficulty 09/02/2014   Hypercholesterolemia    Hypertension    Neuropathy 2015   in legs   NSVT (nonsustained ventricular tachycardia) ([place])    Sinus brady-tachy syndrome ([place])    Stroke ([place]) 05/2014   denies residual on 12/07/2014   Type II diabetes mellitus ([place])    Social  History   Socioeconomic History   Marital status: Widowed    Spouse name: Not on file   Number of children: 0   Years of education: 9   Highest education level: Not on file  Occupational History   Occupation: retired  Tobacco Use   Smoking status: Former    Packs/day: 0.50    Years: 50.00    Total pack years: 25.00    Types: Cigarettes    Start date: 03/07/1953    Quit date: 01/14/2004    Years since quitting: 17.8    Passive exposure: Never   Smokeless tobacco: Never   Tobacco comments:    "quit smoking cigarettes  in ~ 2005"  Vaping Use   Vaping Use: Never used  Substance and Sexual Activity   Alcohol use: No    Alcohol/week: 0.0 standard drinks of alcohol    Comment: 10/242016 "quit drinking beer in ~ 1977"   Drug use: No   Sexual activity: Never  Other Topics Concern   Not on file  Social History Narrative   Patient drinks caffeine a few times a week.   Patient is right handed.   Social Determinants of Health   Financial Resource Strain: Not on file  Food Insecurity: No Food Insecurity (10/28/2021)   Hunger Vital Sign    Worried About Running Out of Food in the Last Year: Never true    Ran Out of Food in the Last Year: Never true  Transportation Needs: No Transportation Needs (10/28/2021)   PRAPARE - Hydrologist (Medical): No    Lack of Transportation (Non-Medical): No  Physical Activity: Not on file  Stress: Not on file  Social Connections: Not on file   Family History  Problem Relation Age of Onset   Cancer Other    Heart attack Brother    Cancer Sister        breast   Alcohol abuse Brother    Scheduled Meds: Continuous Infusions:  norepinephrine (LEVOPHED) Adult infusion 5 mcg/min (12/10/2021 1416)   PRN Meds:. Allergies  Allergen Reactions   Codeine Nausea And Vomiting   Review of Systems  Unable to perform ROS: Acuity of condition    Physical Exam Vitals and nursing note reviewed.  Cardiovascular:     Rate and  Rhythm: Bradycardia present.  Pulmonary:     Effort: No tachypnea, accessory muscle usage or respiratory distress.  Abdominal:     Palpations: Abdomen is soft.  Neurological:     Mental Status: She is unresponsive.     Vital Signs: BP 118/82   Pulse (!) 55   Temp (!) 94.6 F (34.8 C) (Rectal)   Resp 12   Ht '5\' 7"'  (1.702 m)   Wt  77.4 kg   SpO2 100%   BMI 26.73 kg/m  Pain Scale: Faces       SpO2: SpO2: 100 % O2 Device:SpO2: 100 % O2 Flow Rate: .O2 Flow Rate (L/min): 12 L/min  IO: Intake/output summary:  Intake/Output Summary (Last 24 hours) at 11/18/2021 1639 Last data filed at 11/18/2021 1541 Gross per 24 hour  Intake 1288.82 ml  Output --  Net 1288.82 ml    LBM:   Baseline Weight: Weight: 77.4 kg Most recent weight: Weight: 77.4 kg     Palliative Assessment/Data:     Time In: 1600  Time Total: 55 min  Greater than 50%  of this time was spent counseling and coordinating care related to the above assessment and plan.  Signed by: Vinie Sill, NP Palliative Medicine Team Pager # [phone] (M-F 8a-5p) Team Phone # [phone] (Nights/Weekends)

## 2021-11-17 NOTE — ED Notes (Signed)
Bair hugger applied to pt.  

## 2021-11-17 NOTE — H&P (Signed)
History and Physical    Patient: Jody [name] HQI:[id number] DOB: 11/02/1940 DOA: 12/07/2021 DOS: the patient was seen and examined on 11/16/2021 PCP: Monico Blitz, MD  Patient coming from: SNF  Chief Complaint:  Chief Complaint  Patient presents with   Altered Mental Status   HPI: Jody [name] is a 81 year old female with history of stroke, COPD, chronic respiratory failure on 3 L, systolic and diastolic CHF, persistent atrial fibrillation, ESRD, iron deficiency presenting from her nursing facility [facility]) from which she is a permanent resident secondary to altered mental status and hypotension.  Patient was at HD the morning of admission.  After 25 min of HD, patient was hypotensive and somnolent.  Pt unable to provide history due to acute encephalopathy.   At baseline, patient is conversant although pleasantly confused occasionally.  She is bedbound at baseline.  Apparently, the patient was was her usual self prior to going to dialysis on the morning of 12/03/2021. The patient was most recently admitted to the hospital from 10/27/2021 to 11/03/21 for acute on chronic respiratory failure which was thought to be multifactorial secondary to pneumonia, atrial fibrillation with RVR, fluid overload.  The patient was discharged back to her facility with 3 more days of cefdinir and doxycycline. In the ED, the patient was hypothermic with a temperature of 93.7.  She was hypotensive initially with blood pressure 57/43.  The patient was placed on nonrebreather with saturation 100%.  Chest x-ray showed bilateral perihilar opacities.  ABG showed seven-point 1/96/190/29 on 100%.  The patient was started on Levophed 5 mcg/kg/min.  Vancomycin and Zosyn were started.  EKG showed atrial fibrillation with right bundle branch block.  I had a goals of care discussion with the patient's niece who is her POA.  She confirms the patient is DNR status.  she wants to continue aggressive care at least for the next  24 hours.  I requested Dr. Sabra Heck the place central line.   Review of Systems: As mentioned in the history of present illness. All other systems reviewed and are negative. Past Medical History:  Diagnosis Date   Anemia in chronic kidney disease (CODE) 06/05/2015   Arthritis    "bad in my legs" (12/07/2014)   Chronic atrial fibrillation ([place]) 06/05/2015   Chronic back pain    "dr said my spine is crooked"   Chronic bronchitis ([place])    "get it q yr" (13/24/4010)   Complication of anesthesia    headache for 2 days after surgery in May 2019   COPD (chronic obstructive pulmonary disease) ([place])    ESRD on hemodialysis ([place]) 06/05/2015   Gait difficulty 09/02/2014   Hypercholesterolemia    Hypertension    Neuropathy 2015   in legs   NSVT (nonsustained ventricular tachycardia) ([place])    Sinus brady-tachy syndrome ([place])    Stroke ([place]) 05/2014   denies residual on 12/07/2014   Type II diabetes mellitus [facility])    Past Surgical History:  Procedure Laterality Date   ABDOMINAL AORTOGRAM W/LOWER EXTREMITY Left 04/06/2021   Procedure: ABDOMINAL AORTOGRAM W/LOWER EXTREMITY;  Surgeon: Broadus John, MD;  Location: [place] [facility];  Service: Cardiovascular;  Laterality: Left;   ABDOMINAL HYSTERECTOMY  ~ 1970   AMPUTATION TOE Left 04/08/2021   Procedure: FIRST TOE LEFT FOOT AMPUTATION;  Surgeon: Trula Slade, DPM;  Location: [place];  Service: Podiatry;  Laterality: Left;   APPENDECTOMY  ~ [place] Right 07/06/2017   Procedure: RIGHT RADIOCEPHALIC ARTERIOVENOUS FISTULA  creation;  Surgeon: Conrad [place], MD;  Location: [place];  Service: Vascular;  Laterality: Right;   [place] Right 09/17/2017   Procedure: SECOND STAGE BASILIC VEIN TRANSPOSITION RIGHT ARM;  Surgeon: Rosetta Posner, MD;  Location: [place];  Service: Vascular;  Laterality: Right;   COLONOSCOPY N/A 10/28/2017   Procedure: COLONOSCOPY;  Surgeon: Rogene Houston, MD;  [facility];   Service: Endoscopy;  Laterality: N/A;   COLONOSCOPY WITH PROPOFOL N/A 12/29/2019   Procedure: COLONOSCOPY WITH PROPOFOL;  Surgeon: Eloise Harman, DO;  [facility];  Service: Endoscopy;  Laterality: N/A;   ESOPHAGOGASTRODUODENOSCOPY (EGD) WITH PROPOFOL N/A 10/31/2017   Procedure: ESOPHAGOGASTRODUODENOSCOPY (EGD) WITH PROPOFOL;  Surgeon: Rogene Houston, MD;  [facility];  Service: Endoscopy;  Laterality: N/A;   JOINT REPLACEMENT     PERIPHERAL VASCULAR BALLOON ANGIOPLASTY Left 04/06/2021   Procedure: PERIPHERAL VASCULAR BALLOON ANGIOPLASTY;  Surgeon: Broadus John, MD;  Location: [place] CV LAB;  Service: Cardiovascular;  Laterality: Left;  left sfa succesful [place] AT Unsuccesful   TOTAL KNEE ARTHROPLASTY Right ~ [place]  ~ 1970   "9# in my stomach"   Social History:  reports that she quit smoking about 17 years ago. Her smoking use included cigarettes. She started smoking about 68 years ago. She has a 25.00 pack-year smoking history. She has never been exposed to tobacco smoke. She has never used smokeless tobacco. She reports that she does not drink alcohol and does not use drugs.  Allergies  Allergen Reactions   Codeine Nausea And Vomiting    Family History  Problem Relation Age of Onset   Cancer Other    Heart attack Brother    Cancer Sister        breast   Alcohol abuse Brother     Prior to Admission medications   Medication Sig Start Date End Date Taking? Authorizing Provider  acetaminophen (TYLENOL) 325 MG tablet Take 325 mg by mouth 3 (three) times daily.   Yes [provider]  albuterol (PROVENTIL) (2.5 MG/3ML) 0.083% nebulizer solution Take 2.5 mg by nebulization every 6 (six) hours as needed for wheezing or shortness of breath.   Yes [provider]  albuterol (VENTOLIN HFA) 108 (90 Base) MCG/ACT inhaler Inhale 2 puffs into the lungs every 6 (six) hours as needed for wheezing or shortness of breath. Reported  on 03/01/2015 Patient taking differently: Inhale 1-2 puffs into the lungs every 6 (six) hours as needed for wheezing or shortness of breath. Reported on 03/01/2015 07/30/21  Yes Emokpae, Courage, MD  ALPRAZolam (XANAX) 0.25 MG tablet Take 1 tablet (0.25 mg total) by mouth 3 (three) times daily as needed. Patient taking differently: Take 0.25 mg by mouth 3 (three) times daily as needed for anxiety. 11/02/21  Yes Candid Bovey, Shanon Brow, MD  apixaban (ELIQUIS) 2.5 MG TABS tablet Take 2.5 mg by mouth 2 (two) times daily.   Yes [provider]  calcitRIOL (ROCALTROL) 0.5 MCG capsule Take 1 capsule (0.5 mcg total) by mouth Every Tuesday,Thursday,and Saturday with dialysis. 10/25/21  Yes Adriane Gabbert, Shanon Brow, MD  cinacalcet (SENSIPAR) 30 MG tablet Take 1 tablet (30 mg total) by mouth Every Tuesday,Thursday,and Saturday with dialysis. 10/25/21  Yes Seara Hinesley, Shanon Brow, MD  dextromethorphan-guaiFENesin [facility] DM) 30-600 MG 12hr tablet Take 1 tablet by mouth 2 (two) times daily. 07/30/21  Yes Emokpae, Courage, MD  ferrous sulfate 325 (65 FE) MG tablet Take 325 mg by mouth 2 (two)  times daily with a meal.   Yes [provider]  folic acid (FOLVITE) 867 MCG tablet Take 800 mcg by mouth daily.   Yes [provider]  gabapentin (NEURONTIN) 300 MG capsule Take 300 mg by mouth at bedtime. 05/11/21  Yes [provider]  ipratropium-albuterol (DUONEB) 0.5-2.5 (3) MG/3ML SOLN Take 3 mLs by nebulization every 4 (four) hours as needed. 10/24/21  Yes Char Feltman, Shanon Brow, MD  lidocaine-prilocaine (EMLA) cream Apply 1 Application topically as needed (pain).   Yes [provider]  metoprolol succinate (TOPROL-XL) 25 MG 24 hr tablet Take 0.5 tablets (12.5 mg total) by mouth daily. Patient taking differently: Take 25 mg by mouth daily. 11/03/21  Yes Marsha Gundlach, Shanon Brow, MD  midodrine (PROAMATINE) 5 MG tablet Take 3 tablets (15 mg total) by mouth 3 (three) times daily with meals. Patient taking differently: Take 5 mg by mouth 3 (three)  times daily with meals. 11/02/21  Yes Jaelee Laughter, Shanon Brow, MD  Multiple Vitamins-Minerals (PRESERVISION AREDS) TABS Take 1 tablet by mouth at bedtime.   Yes [provider]  Nutritional Supplements (FEEDING SUPPLEMENT, NEPRO CARB STEADY,) LIQD Take 237 mLs by mouth 2 (two) times daily between meals.   Yes [provider]  OXYGEN Inhale into the lungs. May use 2-4 liters   Yes [provider]  pravastatin (PRAVACHOL) 40 MG tablet Take 40 mg by mouth daily. 09/28/21  Yes [provider]  sevelamer carbonate (RENVELA) 800 MG tablet Take 800 mg by mouth 3 (three) times daily with meals.  07/22/19  Yes [provider]  Skin Protectants, Misc. (CALAZIME SKIN PROTECTANT EX) Apply 1 Application topically 3 (three) times daily. Apply to sacrum and right buttocks every shift until healed   Yes [provider]  cefdinir (OMNICEF) 300 MG capsule Take 1 capsule (300 mg total) by mouth daily. X 3 days Patient not taking: Reported on 11/25/2021 11/03/21   Orson Eva, MD  doxycycline (VIBRA-TABS) 100 MG tablet Take 1 tablet (100 mg total) by mouth every 12 (twelve) hours. X 3 days Patient not taking: Reported on 11/19/2021 11/03/21   Orson Eva, MD  predniSONE (DELTASONE) 10 MG tablet Take 10 mg by mouth 2 (two) times daily. Patient not taking: Reported on 11/05/2021 08/01/21   [provider]    Physical Exam: Vitals:   12/08/2021 1245 11/24/2021 1300 12/12/2021 1315 12/08/2021 1345  BP: 111/65 100/71 116/79 116/78  Pulse: 88 98 90 93  Resp: '19 16 13 12  '$ Temp:      TempSrc:      SpO2: 100% 100% 100% 100%  Weight:      Height:       GENERAL: awakens to voice, NAD, sickly appearing.  Does not follow commands HEENT: [place]/AT, No thrush, No icterus, No oral ulcers Neck:  No neck mass, No meningismus, soft, supple CV: IRRR, no S3, no S4, no rub, no JVD Lungs:  bilateral rales.  Now heeze Abd: soft/NT +BS, nondistended Ext: 1 + LE edema, no lymphangitis, no cyanosis, no  rashes  Data Reviewed: Data reviewed above in history  Assessment and Plan: Septic shock -due to pneumonia -presented with hypothermia, hypotension, altered mental status -lactic acid -PCT -continue empiric vanc/zosyn -follow blood culture  Acute on chronic respiratory failure with hypoxia and hypercapnia (HCC) Personally reviewed CXR--increased interstitial markings; bilateral perihilar opacities place on BiPAP Chronic on 3-4 L -- 10/24/21 Echo-EF 40-45%, global HK, RV overload, PASP 57.5   Lobar Pneumonia -started on zosyn, vanc -zosyn/vanc as discussed  ESRD on dialysis [facility]) Schedule--Tuesday Thursday Saturday.   Reports compliance with her HD sessions. -plan to consult nephrology   Acute on chronic combined systolic and diastolic CHF (congestive heart failure) (HCC) - Manage volume with HD -06/03/21 Echo EF 50%, RV overload, severe TR -08/02/20 Echo EF 45-50% HK inferoseptal, inferolateral, Inf wall; mod TR -- 9/10 limited echo-- 10/24/21 Echo-EF 40-45%, global HK, RV overload, PASP 57.5 -last HD 11/03/21 --hold metoprolol succinate due to hypotension  Acute metabolic Encephalopathy -due to sepsis   Dysphagia -evaluated by speech last admission -continue dys 2 diet with thin liquids last admission   Persistent atrial fibrillation ([place]) On anticoagulation with Eliquis. Rate Controlled, heart rate 50s to 90s.  Not on rate limiting agents. - Resume Eliquis when able to tolerate po   Essential hypertension Previously on metoprolol>>dose was decreased to 12.5 mg daily BPs remain stable/soft   Thrombocytopenia (HCC) Fairly chronic dating back to Feb 2023 -B12--1435 -folate-7.0 -TSH--1.132 -acute worsening due to sepsis     PAD (peripheral artery disease) (HCC) - Continue statin and ASA when able to tolerate po     Goals of care, counseling/discussion Discussed with the patient's POA, [name] last admission 10/23/21 Discussed GOC again with [name]  10/5>>confirmed DNR, continue aggressive care for now -- I discussed the patient's overall poor prognosis given her frailty, multiple comorbid conditions, numerous hospitalizations and poor functional baseline    Advance Care Planning: DNR  Consults: none  Family Communication: niece updated 10/5  Severity of Illness: The appropriate patient status for this patient is INPATIENT. Inpatient status is judged to be reasonable and necessary in order to provide the required intensity of service to ensure the patient's safety. The patient's presenting symptoms, physical exam findings, and initial radiographic and laboratory data in the context of their chronic comorbidities is felt to place them at high risk for further clinical deterioration. Furthermore, it is not anticipated that the patient will be medically stable for discharge from the hospital within 2 midnights of admission.   * I certify that at the point of admission it is my clinical judgment that the patient will require inpatient hospital care spanning beyond 2 midnights from the point of admission due to high intensity of service, high risk for further deterioration and high frequency of surveillance required.*  Author: Orson Eva, MD 12/05/2021 1:57 PM  For on call review [email or web address].

## 2021-11-17 NOTE — ED Triage Notes (Signed)
Pt arrived via [facility] from dialysis for AMS and hypotension. Pt did not receive full dialysis treatment. Pt from [facility]

## 2021-11-17 NOTE — ED Notes (Signed)
CRITICAL VALUE STICKER  CRITICAL VALUE: pH 7.1 , pCO2 96  DATE & TIME NOTIFIED: 11/25/2021 , 1203  MD NOTIFIED: Noemi Chapel, MD  TIME OF NOTIFICATION: 1203  RESPONSE: na

## 2021-11-17 NOTE — Consult Note (Signed)
[facility]   Patient [name] Admit date: 11/14/2021  DOB: [birth year] LOS: 0  MRN: [id number] Consult date: 12/13/2021  Referring provider: Dr. Carles Collet, [facility]: Respiratory failure    History:  81 yo female resident of [facility] was at hemodialysis and developed altered mental status, hypotension and hypothermia.  Sent to [facility].  ABG showed acute hypoxia and hypercapnia.  She was started on Bipap, warming blanket, antibiotics and levophed.  PCCM consulted to assist with management in ICU.  This is her 10 hospital admission in [facility] system since January 2023.  Pt unable to provide hx.  Past medical history:  ESRD on HD, Chronic a fib, Back pain, COPD, HLD, HTN, Neuropathy, SVT, CVA, DM type 2  Significant events:  10/05 Admit  Studies:    Micro:  Blood 10/05 >>   Lines:  Rt femoral CVL 10/05 >>    Antibiotics:  Vancomycin 10/05 >> Zosyn 10/05 >>   Consults:      Interim history:  Wearing Bipap, levophed running.  Vital signs:  BP 105/66   Pulse 98   Temp (!) 94.6 F (34.8 C) (Rectal)   Resp 18   Ht '5\' 7"'$  (1.702 m)   Wt 77.4 kg   SpO2 93%   BMI 26.73 kg/m   Intake/output:  No intake/output data recorded.   Physical exam:   General - somnolent, bear hugger on Eyes - pupils reactive ENT - Bipap mask on Cardiac - irregular Chest - poor air movement, decreased breath sounds Abdomen - soft, non tender, decreased bowel sounds Extremities - 1+ edema Skin - no rashes Neuro - opens eyes with stimulation, mumbles few sounds  Best practice:   DVT -   SUP -   Nutrition - NPO   Assessment/plan:   Septic shock. - source unclear at this time - continue ABx - f/u culture results - pressors to keep MAP > 65  Acute metabolic encephalopathy 2nd to sepsis and hypercapnia. - monitor mental status - f/u CT head  - hold outpt xanax, neurontin  Acute hypoxic/hypercapnic respiratory  failure. COPD. - continue Bipap - goal SpO2 90 to 95%  Elevated troponin likely from demand ischemia. Chronic a fib. - hold outpt eliquis  ESRD. - she will not be able to tolerate iHD in her current state  Macrocytic anemia. Thrombocytopenia in setting of sepsis. - f/u CBC  Resolved hospital problems:    Goals of care/Family discussions:  Code status: DNR/DNI  Primary d/w POA.  Continue therapy for next 24 hours - if no improvement, then might consider transition to comfort measures.  This is a reasonable approach.  Labs:      Latest Ref Rng & Units 12/01/2021   11:15 AM 11/05/2021    5:00 PM 11/03/2021   12:35 PM  CMP  Glucose 70 - 99 mg/dL 170  150  245   BUN 8 - 23 mg/dL 44  39  61   Creatinine 0.44 - 1.00 mg/dL 3.30  2.95  4.13   Sodium 135 - 145 mmol/L 132  136  133   Potassium 3.5 - 5.1 mmol/L 4.5  3.5  4.5   Chloride 98 - 111 mmol/L 94  91  92   CO2 22 - 32 mmol/L '29  30  26   '$ Calcium 8.9 - 10.3 mg/dL 8.9  9.9  10.0   Total Protein 6.5 - 8.1 g/dL 5.9     Total Bilirubin 0.3 - 1.2 mg/dL 1.1  Alkaline Phos 38 - 126 U/L 148     AST 15 - 41 U/L 20     ALT 0 - 44 U/L 19          Latest Ref Rng & Units 11/19/2021   11:15 AM 11/05/2021    6:25 PM 11/03/2021   12:40 PM  CBC  WBC 4.0 - 10.5 K/uL 3.9  7.1  7.1   Hemoglobin 12.0 - 15.0 g/dL 10.3  14.7  11.7   Hematocrit 36.0 - 46.0 % 36.0  51.2  39.9   Platelets 150 - 400 K/uL 87  110  162     ABG    Component Value Date/Time   PHART 7.1 (LL) 11/13/2021 1150   PCO2ART 96 (HH) 12/04/2021 1150   PO2ART 190 (H) 12/08/2021 1150   HCO3 29.8 (H) 11/28/2021 1150   TCO2 17 05/27/2014 1835   ACIDBASEDEF 2.5 (H) 12/11/2021 1150   O2SAT 100 11/13/2021 1150    CBG (last 3)  Recent Labs    12/09/2021 1135  [place] 162*     Past surgical history:  She  has a past surgical history that includes Joint replacement; Tumor excision (~ 1970); Appendectomy (~ 1970); Total knee arthroplasty (Right, ~ 1986); Abdominal  hysterectomy (~ 1970); AV fistula placement (Right, 07/06/2017); Bascilic vein transposition (Right, 09/17/2017); Colonoscopy (N/A, 10/28/2017); Esophagogastroduodenoscopy (egd) with propofol (N/A, 10/31/2017); Colonoscopy with propofol (N/A, 12/29/2019); ABDOMINAL AORTOGRAM W/LOWER EXTREMITY (Left, 04/06/2021); PERIPHERAL VASCULAR BALLOON ANGIOPLASTY (Left, 04/06/2021); and Amputation toe (Left, 04/08/2021).  Social history:  She  reports that she quit smoking about 17 years ago. Her smoking use included cigarettes. She started smoking about 68 years ago. She has a 25.00 pack-year smoking history. She has never been exposed to tobacco smoke. She has never used smokeless tobacco. She reports that she does not drink alcohol and does not use drugs.   Review of systems:  Unable to obtain.  Family history:  Her family history includes Alcohol abuse in her brother; Cancer in her sister and another family member; Heart attack in her brother.    Medications:   No current facility-administered medications on file prior to encounter.   Current Outpatient Medications on File Prior to Encounter  Medication Sig   acetaminophen (TYLENOL) 325 MG tablet Take 325 mg by mouth 3 (three) times daily.   albuterol (PROVENTIL) (2.5 MG/3ML) 0.083% nebulizer solution Take 2.5 mg by nebulization every 6 (six) hours as needed for wheezing or shortness of breath.   albuterol (VENTOLIN HFA) 108 (90 Base) MCG/ACT inhaler Inhale 2 puffs into the lungs every 6 (six) hours as needed for wheezing or shortness of breath. Reported on 03/01/2015 (Patient taking differently: Inhale 1-2 puffs into the lungs every 6 (six) hours as needed for wheezing or shortness of breath. Reported on 03/01/2015)   ALPRAZolam (XANAX) 0.25 MG tablet Take 1 tablet (0.25 mg total) by mouth 3 (three) times daily as needed. (Patient taking differently: Take 0.25 mg by mouth 3 (three) times daily as needed for anxiety.)   apixaban (ELIQUIS) 2.5 MG TABS tablet Take  2.5 mg by mouth 2 (two) times daily.   calcitRIOL (ROCALTROL) 0.5 MCG capsule Take 1 capsule (0.5 mcg total) by mouth Every Tuesday,Thursday,and Saturday with dialysis.   cinacalcet (SENSIPAR) 30 MG tablet Take 1 tablet (30 mg total) by mouth Every Tuesday,Thursday,and Saturday with dialysis.   dextromethorphan-guaiFENesin (MUCINEX DM) 30-600 MG 12hr tablet Take 1 tablet by mouth 2 (two) times daily.   ferrous sulfate 325 (65 FE)  MG tablet Take 325 mg by mouth 2 (two) times daily with a meal.   folic acid (FOLVITE) 536 MCG tablet Take 800 mcg by mouth daily.   gabapentin (NEURONTIN) 300 MG capsule Take 300 mg by mouth at bedtime.   ipratropium-albuterol (DUONEB) 0.5-2.5 (3) MG/3ML SOLN Take 3 mLs by nebulization every 4 (four) hours as needed.   lidocaine-prilocaine (EMLA) cream Apply 1 Application topically as needed (pain).   metoprolol succinate (TOPROL-XL) 25 MG 24 hr tablet Take 0.5 tablets (12.5 mg total) by mouth daily. (Patient taking differently: Take 25 mg by mouth daily.)   midodrine (PROAMATINE) 5 MG tablet Take 3 tablets (15 mg total) by mouth 3 (three) times daily with meals. (Patient taking differently: Take 5 mg by mouth 3 (three) times daily with meals.)   Multiple Vitamins-Minerals (PRESERVISION AREDS) TABS Take 1 tablet by mouth at bedtime.   Nutritional Supplements (FEEDING SUPPLEMENT, NEPRO CARB STEADY,) LIQD Take 237 mLs by mouth 2 (two) times daily between meals.   OXYGEN Inhale into the lungs. May use 2-4 liters   pravastatin (PRAVACHOL) 40 MG tablet Take 40 mg by mouth daily.   sevelamer carbonate (RENVELA) 800 MG tablet Take 800 mg by mouth 3 (three) times daily with meals.    Skin Protectants, Misc. (CALAZIME SKIN PROTECTANT EX) Apply 1 Application topically 3 (three) times daily. Apply to sacrum and right buttocks every shift until healed   cefdinir (OMNICEF) 300 MG capsule Take 1 capsule (300 mg total) by mouth daily. X 3 days (Patient not taking: Reported on 11/21/2021)    doxycycline (VIBRA-TABS) 100 MG tablet Take 1 tablet (100 mg total) by mouth every 12 (twelve) hours. X 3 days (Patient not taking: Reported on 11/20/2021)   predniSONE (DELTASONE) 10 MG tablet Take 10 mg by mouth 2 (two) times daily. (Patient not taking: Reported on 11/05/2021)     Critical care time: 38 minutes  Chesley Mires, MD [place] Pager - [phone] 11/28/2021, 3:07 PM

## 2021-11-17 NOTE — Progress Notes (Signed)
Pharmacy Antibiotic Note  Jody [name] is a 81 y.o. female admitted on 11/23/2021 with sepsis.  Pharmacy has been consulted for vanc/cefepime dosing.  Plan: Vanc 1gm X1 ( Vanc 1 gm given in ED @ 1400), Zosyn 2.25gm q8h (Zosyn 3.375 gm given in ED @ 1400). Possible comfort care tom. F/u Bcx and HD schedule to guide further vanc dosing  Height: '5\' 7"'$  (170.2 cm) Weight: 84.5 kg (186 lb 4.6 oz) IBW/kg (Calculated) : 61.6  Temp (24hrs), Avg:95.5 F (35.3 C), Min:93.7 F (34.3 C), Max:97.6 F (36.4 C)  Recent Labs  Lab 11/16/2021 1115 12/03/2021 1558 12/08/2021 1756  WBC 3.9*  --   --   CREATININE 3.30*  --   --   LATICACIDVEN  --  1.0 1.0    Estimated Creatinine Clearance: 14.4 mL/min (A) (by C-G formula based on SCr of 3.3 mg/dL (H)).    Allergies  Allergen Reactions   Codeine Nausea And Vomiting    Antimicrobials this admission: Vanc 10/5 >>  Zosyn 10/5 >>    Microbiology results: 10/5 BCx: pending   Thank you for allowing pharmacy to be a part of this patient's care.  Narda Amber 11/20/2021 9:07 PM

## 2021-11-18 DIAGNOSIS — I4819 Other persistent atrial fibrillation: Secondary | ICD-10-CM | POA: Diagnosis not present

## 2021-11-18 DIAGNOSIS — R6521 Severe sepsis with septic shock: Secondary | ICD-10-CM | POA: Diagnosis not present

## 2021-11-18 DIAGNOSIS — J9602 Acute respiratory failure with hypercapnia: Secondary | ICD-10-CM | POA: Diagnosis not present

## 2021-11-18 DIAGNOSIS — A419 Sepsis, unspecified organism: Secondary | ICD-10-CM | POA: Diagnosis not present

## 2021-11-18 LAB — CBC
HCT: 35.5 % — ABNORMAL LOW (ref 36.0–46.0)
Hemoglobin: 10.3 g/dL — ABNORMAL LOW (ref 12.0–15.0)
MCH: 32.2 pg (ref 26.0–34.0)
MCHC: 29 g/dL — ABNORMAL LOW (ref 30.0–36.0)
MCV: 110.9 fL — ABNORMAL HIGH (ref 80.0–100.0)
Platelets: 130 10*3/uL — ABNORMAL LOW (ref 150–400)
RBC: 3.2 MIL/uL — ABNORMAL LOW (ref 3.87–5.11)
RDW: 17.5 % — ABNORMAL HIGH (ref 11.5–15.5)
WBC: 7.4 10*3/uL (ref 4.0–10.5)
nRBC: 0 % (ref 0.0–0.2)

## 2021-11-18 LAB — COMPREHENSIVE METABOLIC PANEL
ALT: 21 U/L (ref 0–44)
AST: 20 U/L (ref 15–41)
Albumin: 3.4 g/dL — ABNORMAL LOW (ref 3.5–5.0)
Alkaline Phosphatase: 130 U/L — ABNORMAL HIGH (ref 38–126)
Anion gap: 11 (ref 5–15)
BUN: 48 mg/dL — ABNORMAL HIGH (ref 8–23)
CO2: 28 mmol/L (ref 22–32)
Calcium: 9.2 mg/dL (ref 8.9–10.3)
Chloride: 93 mmol/L — ABNORMAL LOW (ref 98–111)
Creatinine, Ser: 3.65 mg/dL — ABNORMAL HIGH (ref 0.44–1.00)
GFR, Estimated: 12 mL/min — ABNORMAL LOW (ref >60)
Glucose, Bld: 167 mg/dL — ABNORMAL HIGH (ref 70–99)
Potassium: 5 mmol/L (ref 3.5–5.1)
Sodium: 132 mmol/L — ABNORMAL LOW (ref 135–145)
Total Bilirubin: 1.4 mg/dL — ABNORMAL HIGH (ref 0.3–1.2)
Total Protein: 6.1 g/dL — ABNORMAL LOW (ref 6.5–8.1)

## 2021-11-18 LAB — BLOOD GAS, ARTERIAL
Acid-base deficit: 1.6 mmol/L (ref 0.0–2.0)
Bicarbonate: 29.6 mmol/L — ABNORMAL HIGH (ref 20.0–28.0)
Collection site: POSITIVE
Drawn by: 22223
FIO2: 40 %
O2 Saturation: 97.9 %
Patient temperature: 37
pCO2 arterial: 85 mmHg (ref 32–48)
pH, Arterial: 7.15 — CL (ref 7.35–7.45)
pO2, Arterial: 95 mmHg (ref 83–108)

## 2021-11-18 LAB — PROCALCITONIN
Procalcitonin: 0.8 ng/mL
Procalcitonin: 0.86 ng/mL

## 2021-11-18 MED ORDER — ORAL CARE MOUTH RINSE
15.0000 mL | OROMUCOSAL | Status: DC
  Administered 2021-11-18 – 2021-11-26 (×27): 15 mL via OROMUCOSAL

## 2021-11-18 MED ORDER — ORAL CARE MOUTH RINSE: 15.0000 mL | OROMUCOSAL | Status: DC | PRN

## 2021-11-18 MED ORDER — ENOXAPARIN SODIUM 100 MG/ML IJ SOSY
1.0000 mg/kg | PREFILLED_SYRINGE | INTRAMUSCULAR | Status: DC
  Administered 2021-11-18 – 2021-11-20 (×3): 85 mg via SUBCUTANEOUS
  Filled 2021-11-18 (×3): qty 1

## 2021-11-18 MED ORDER — CHLORHEXIDINE GLUCONATE CLOTH 2 % EX PADS
6.0000 | MEDICATED_PAD | Freq: Every day | CUTANEOUS | Status: DC
  Administered 2021-11-18 – 2021-11-26 (×8): 6 via TOPICAL

## 2021-11-18 MED ORDER — HYDROMORPHONE HCL 1 MG/ML IJ SOLN
0.5000 mg | INTRAMUSCULAR | Status: DC | PRN
  Administered 2021-11-18 – 2021-11-26 (×9): 0.5 mg via INTRAVENOUS
  Filled 2021-11-18 (×9): qty 0.5

## 2021-11-18 MED ORDER — SODIUM CHLORIDE 0.9 % IV SOLN
2.2500 g | Freq: Three times a day (TID) | INTRAVENOUS | Status: DC
  Administered 2021-11-18 – 2021-11-19 (×3): 2.25 g via INTRAVENOUS
  Filled 2021-11-18 (×6): qty 10

## 2021-11-18 NOTE — Progress Notes (Signed)
ANTICOAGULATION CONSULT NOTE - Initial Consult  Pharmacy Consult for Lovenox (holding Apixaban) Indication: atrial fibrillation  Allergies  Allergen Reactions   Codeine Nausea And Vomiting   Patient Measurements: Height: '5\' 7"'$  (170.2 cm) Weight: 84.5 kg (186 lb 4.6 oz) IBW/kg (Calculated) : 61.6  Vital Signs: Temp: 97.6 F (36.4 C) (10/05 2300) Temp Source: Axillary (10/05 2300) BP: 124/59 (10/05 2330) Pulse Rate: 93 (10/05 2330)  Labs: Recent Labs    11/24/2021 1115  HGB 10.3*  HCT 36.0  PLT 87*  LABPROT 16.1*  INR 1.3*  CREATININE 3.30*  TROPONINIHS 45*    Estimated Creatinine Clearance: 14.4 mL/min (A) (by C-G formula based on SCr of 3.3 mg/dL (H)).   Medical History: Past Medical History:  Diagnosis Date   Anemia in chronic kidney disease (CODE) 06/05/2015   Arthritis    "bad in my legs" (12/07/2014)   Chronic atrial fibrillation ([place]) 06/05/2015   Chronic back pain    "dr said my spine is crooked"   Chronic bronchitis ([place])    "get it q yr" (86/75/4492)   Complication of anesthesia    headache for 2 days after surgery in May 2019   COPD (chronic obstructive pulmonary disease) ([place])    ESRD on hemodialysis ([place]) 06/05/2015   Gait difficulty 09/02/2014   Hypercholesterolemia    Hypertension    Neuropathy 2015   in legs   NSVT (nonsustained ventricular tachycardia) ([place])    Sinus brady-tachy syndrome ([place])    Stroke ([place]) 05/2014   denies residual on 12/07/2014   Type II diabetes mellitus ([facility])     Assessment: 81 y/o F on apixaban PTA for afib. Holding apixaban and using Lovenox for now. Last dose of apixaban was >24 hours ago, ok to start Lovenox now. Noted renal dysfunction requiring Lovenox frequency adjustment. Somewhat chronic thrombocytopenia.   Goal of Therapy:  Monitor platelets by anticoagulation protocol: Yes   Plan:  Lovenox 1 mg/kg subcutaneous q24h Daily CBC Monitor for bleeding  Narda Bonds, PharmD, BCPS Clinical  Pharmacist Phone: [phone]

## 2021-11-18 NOTE — Progress Notes (Signed)
[facility]   Patient [name] Admit date: 12/04/2021  DOB: [birth year] LOS: 1  MRN: [id number] Consult date: 12/04/2021  Referring provider: Dr. Carles Collet, [facility]: Respiratory failure    History:  81 yo female resident of [facility] was at hemodialysis and developed altered mental status, hypotension and hypothermia.  Sent to [facility].  ABG showed acute hypoxia and hypercapnia.  She was started on Bipap, warming blanket, antibiotics and levophed.  PCCM consulted to assist with management in ICU.  This is her 10 hospital admission in [facility] system since January 2023.  Pt unable to provide hx.  Past medical history:  ESRD on HD, Chronic a fib, Back pain, COPD, HLD, HTN, Neuropathy, SVT, CVA, DM type 2  Significant events:  10/05 Admit  Studies:    Micro:  Blood 10/05 >>   Lines:  Rt femoral CVL 10/05 >>    Antibiotics:  Vancomycin 10/05 >> Zosyn 10/05 >>   Consults:  Palliative care Nephrology    Interim history:  Remains on Bipap and levophed.  Vital signs:  BP (!) 105/37   Pulse 74   Temp 98.6 F (37 C) (Rectal)   Resp 18   Ht '5\' 7"'$  (1.702 m)   Wt 87.9 kg   SpO2 95%   BMI 30.35 kg/m   Intake/output:  I/O last 3 completed shifts: In: 1956.3 [I.V.:467.5; IV Piggyback:1488.8] Out: -    Physical exam:   General - more alert, appears fatigued and frustrated Eyes - pupils reactive ENT - Bipap mask on Cardiac - irregular Chest - decreased breath sounds, no wheeze Abdomen - soft, non tender, + bowel sounds Extremities - no cyanosis, clubbing, or edema Skin - no rashes Neuro - able to follow simple commands   Best practice:   DVT -   SUP -   Nutrition - NPO   Assessment:   Septic shock with unclear source Acute metabolic encephalopathy 2nd to sepsis and hypercapnia. Acute hypoxic/hypercapnic respiratory failure. COPD. Elevated troponin likely from demand ischemia. Chronic a  fib. ESRD. Macrocytic anemia. Thrombocytopenia in setting of sepsis.  Discussion/Plan:  - she remains Bipap and pressor dependent - she is not a candidate at this time for iHD or CRRT - she has requested to have Bipap taken off - had detailed discussion with her family present.  We are all in agreement that she has been suffering and there isn't any realistic chance she will improve to an acceptable functional status.  As such we will stop Bipap w/o plans to resume it.  Continue other medications and supplemental oxygen therapy for now - if he status gets worse, then the plan is to transition to comfort measures  D/w Dr. Carles Collet  Goals of care/Family discussions:  Code status: DNR/DNI  Critical care time: 36 minutes  Chesley Mires, MD [place] Pager - [phone] 11/18/2021, 1:42 PM

## 2021-11-18 NOTE — Plan of Care (Signed)

## 2021-11-18 NOTE — Progress Notes (Signed)
Pt taken off BiPAP and placed on 4L [place]. Pt request to be taken off the BiPAP. Oxygen saturation remaining stable at the moment, will continue to monitor.

## 2021-11-18 NOTE — Consult Note (Signed)
[facility] Renal Consultation Note    Indication for Consultation:  Management of ESRD/hemodialysis; anemia, hypertension/volume and secondary hyperparathyroidism  HPI: Jody [name] is a 81 y.o. female with a PMH significant for HTN, DM type 2, h/o SSS, h/o CVA, COPD, HLD, chronic atrial fibrillation, recent osteomyelitis of left foot, and ESRD who presented to [facility] ED on 11/13/2021 via EMS from HD due to AMS and hypotension.  In the ED, temp 93.7, BP 57/43, CXR with bilateral opacities.  ABG with hypoxia and hypercapnia.  She was started on levophed and admitted to the ICU for septic shock.  She is DNR and was placed on Bipap.  We were consulted to provide dialysis during her hospitalization and discussion regarding goals of care.  Past Medical History:  Diagnosis Date   Anemia in chronic kidney disease (CODE) 06/05/2015   Arthritis    "bad in my legs" (12/07/2014)   Chronic atrial fibrillation ([place]) 06/05/2015   Chronic back pain    "dr said my spine is crooked"   Chronic bronchitis ([place])    "get it q yr" (49/44/9675)   Complication of anesthesia    headache for 2 days after surgery in May 2019   COPD (chronic obstructive pulmonary disease) ([place])    ESRD on hemodialysis ([place]) 06/05/2015   Gait difficulty 09/02/2014   Hypercholesterolemia    Hypertension    Neuropathy 2015   in legs   NSVT (nonsustained ventricular tachycardia) ([place])    Sinus brady-tachy syndrome ([place])    Stroke ([place]) 05/2014   denies residual on 12/07/2014   Type II diabetes mellitus [facility])    Past Surgical History:  Procedure Laterality Date   ABDOMINAL AORTOGRAM W/LOWER EXTREMITY Left 04/06/2021   Procedure: ABDOMINAL AORTOGRAM W/LOWER EXTREMITY;  Surgeon: Broadus John, MD;  Location: [place] [facility];  Service: Cardiovascular;  Laterality: Left;   ABDOMINAL HYSTERECTOMY  ~ 1970   AMPUTATION TOE Left 04/08/2021   Procedure: FIRST TOE LEFT FOOT AMPUTATION;  Surgeon: Trula Slade, DPM;   Location: [place];  Service: Podiatry;  Laterality: Left;   APPENDECTOMY  ~ [place] Right 07/06/2017   Procedure: RIGHT RADIOCEPHALIC ARTERIOVENOUS FISTULA creation;  Surgeon: Conrad Rosemont, MD;  Location: [place];  Service: Vascular;  Laterality: Right;   [place] Right 09/17/2017   Procedure: SECOND STAGE BASILIC VEIN TRANSPOSITION RIGHT ARM;  Surgeon: Rosetta Posner, MD;  Location: [place];  Service: Vascular;  Laterality: Right;   COLONOSCOPY N/A 10/28/2017   Procedure: COLONOSCOPY;  Surgeon: Rogene Houston, MD;  [facility];  Service: Endoscopy;  Laterality: N/A;   COLONOSCOPY WITH PROPOFOL N/A 12/29/2019   Procedure: COLONOSCOPY WITH PROPOFOL;  Surgeon: Eloise Harman, DO;  [facility];  Service: Endoscopy;  Laterality: N/A;   ESOPHAGOGASTRODUODENOSCOPY (EGD) WITH PROPOFOL N/A 10/31/2017   Procedure: ESOPHAGOGASTRODUODENOSCOPY (EGD) WITH PROPOFOL;  Surgeon: Rogene Houston, MD;  [facility];  Service: Endoscopy;  Laterality: N/A;   JOINT REPLACEMENT     PERIPHERAL VASCULAR BALLOON ANGIOPLASTY Left 04/06/2021   Procedure: PERIPHERAL VASCULAR BALLOON ANGIOPLASTY;  Surgeon: Broadus John, MD;  Location: [place] [facility];  Service: Cardiovascular;  Laterality: Left;  left sfa succesful [place] AT Unsuccesful   TOTAL KNEE ARTHROPLASTY Right ~ 1986   TUMOR EXCISION  ~ 1970   "9# in my stomach"   Family History:   Family History  Problem Relation Age of Onset   Cancer Other  Heart attack Brother    Cancer Sister        breast   Alcohol abuse Brother    Social History:  reports that she quit smoking about 17 years ago. Her smoking use included cigarettes. She started smoking about 68 years ago. She has a 25.00 pack-year smoking history. She has never been exposed to tobacco smoke. She has never used smokeless tobacco. She reports that she does not drink alcohol and does not use drugs. Allergies  Allergen Reactions    Codeine Nausea And Vomiting   Prior to Admission medications   Medication Sig Start Date End Date Taking? Authorizing Provider  acetaminophen (TYLENOL) 325 MG tablet Take 325 mg by mouth 3 (three) times daily.   Yes [provider]  albuterol (PROVENTIL) (2.5 MG/3ML) 0.083% nebulizer solution Take 2.5 mg by nebulization every 6 (six) hours as needed for wheezing or shortness of breath.   Yes [provider]  albuterol (VENTOLIN HFA) 108 (90 Base) MCG/ACT inhaler Inhale 2 puffs into the lungs every 6 (six) hours as needed for wheezing or shortness of breath. Reported on 03/01/2015 Patient taking differently: Inhale 1-2 puffs into the lungs every 6 (six) hours as needed for wheezing or shortness of breath. Reported on 03/01/2015 07/30/21  Yes Emokpae, Courage, MD  ALPRAZolam (XANAX) 0.25 MG tablet Take 1 tablet (0.25 mg total) by mouth 3 (three) times daily as needed. Patient taking differently: Take 0.25 mg by mouth 3 (three) times daily as needed for anxiety. 11/02/21  Yes Tat, Shanon Brow, MD  apixaban (ELIQUIS) 2.5 MG TABS tablet Take 2.5 mg by mouth 2 (two) times daily.   Yes [provider]  calcitRIOL (ROCALTROL) 0.5 MCG capsule Take 1 capsule (0.5 mcg total) by mouth Every Tuesday,Thursday,and Saturday with dialysis. 10/25/21  Yes Tat, Shanon Brow, MD  cinacalcet (SENSIPAR) 30 MG tablet Take 1 tablet (30 mg total) by mouth Every Tuesday,Thursday,and Saturday with dialysis. 10/25/21  Yes Tat, Shanon Brow, MD  dextromethorphan-guaiFENesin [facility] DM) 30-600 MG 12hr tablet Take 1 tablet by mouth 2 (two) times daily. 07/30/21  Yes Emokpae, Courage, MD  ferrous sulfate 325 (65 FE) MG tablet Take 325 mg by mouth 2 (two) times daily with a meal.   Yes [provider]  folic acid (FOLVITE) 527 MCG tablet Take 800 mcg by mouth daily.   Yes [provider]  gabapentin (NEURONTIN) 300 MG capsule Take 300 mg by mouth at bedtime. 05/11/21  Yes [provider]   ipratropium-albuterol (DUONEB) 0.5-2.5 (3) MG/3ML SOLN Take 3 mLs by nebulization every 4 (four) hours as needed. 10/24/21  Yes Tat, Shanon Brow, MD  lidocaine-prilocaine (EMLA) cream Apply 1 Application topically as needed (pain).   Yes [provider]  metoprolol succinate (TOPROL-XL) 25 MG 24 hr tablet Take 0.5 tablets (12.5 mg total) by mouth daily. Patient taking differently: Take 25 mg by mouth daily. 11/03/21  Yes Tat, Shanon Brow, MD  midodrine (PROAMATINE) 5 MG tablet Take 3 tablets (15 mg total) by mouth 3 (three) times daily with meals. Patient taking differently: Take 5 mg by mouth 3 (three) times daily with meals. 11/02/21  Yes Tat, Shanon Brow, MD  Multiple Vitamins-Minerals (PRESERVISION AREDS) TABS Take 1 tablet by mouth at bedtime.   Yes [provider]  Nutritional Supplements (FEEDING SUPPLEMENT, NEPRO CARB STEADY,) LIQD Take 237 mLs by mouth 2 (two) times daily between meals.   Yes [provider]  OXYGEN Inhale into the lungs. May use 2-4 liters   Yes [provider]  pravastatin (PRAVACHOL) 40 MG tablet Take 40 mg by mouth daily. 09/28/21  Yes [provider]  sevelamer carbonate (RENVELA) 800 MG tablet Take 800 mg by mouth 3 (three) times daily with meals.  07/22/19  Yes [provider]  Skin Protectants, Misc. (CALAZIME SKIN PROTECTANT EX) Apply 1 Application topically 3 (three) times daily. Apply to sacrum and right buttocks every shift until healed   Yes [provider]  cefdinir (OMNICEF) 300 MG capsule Take 1 capsule (300 mg total) by mouth daily. X 3 days Patient not taking: Reported on 12/11/2021 11/03/21   Orson Eva, MD  doxycycline (VIBRA-TABS) 100 MG tablet Take 1 tablet (100 mg total) by mouth every 12 (twelve) hours. X 3 days Patient not taking: Reported on 11/25/2021 11/03/21   Orson Eva, MD  predniSONE (DELTASONE) 10 MG tablet Take 10 mg by mouth 2 (two) times daily. Patient not taking: Reported on 11/05/2021 08/01/21    [provider]   Current Facility-Administered Medications  Medication Dose Route Frequency Provider Last Rate Last Admin   Chlorhexidine Gluconate Cloth 2 % PADS 6 each  6 each Topical Daily Tat, David, MD   6 each at 11/18/21 0529   enoxaparin (LOVENOX) injection 85 mg  1 mg/kg Subcutaneous Q24H Erenest Blank, RPH   85 mg at 11/18/21 0055   norepinephrine (LEVOPHED) '4mg'$  in 273m (0.016 mg/mL) premix infusion  0-40 mcg/min Intravenous Titrated MNoemi Chapel MD 26.3 mL/hr at 11/18/21 0910 7 mcg/min at 11/18/21 0910   Oral care mouth rinse  15 mL Mouth Rinse 4 times per day TOrson Eva MD   15 mL at 11/18/21 0910   Oral care mouth rinse  15 mL Mouth Rinse PRN Tat, DShanon Brow MD       piperacillin-tazobactam (ZOSYN) IVPB 2.25 g  2.25 g Intravenous Q8H PNarda Amber RPH 100 mL/hr at 11/18/21 0125 2.25 g at 11/18/21 0125   vancomycin variable dose per unstable renal function (pharmacist dosing)   Does not apply See admin instructions [facility]      Labs: Basic Metabolic Panel: Recent Labs  Lab 12/01/2021 1115 11/18/21 0729  NA 132* 132*  K 4.5 5.0  CL 94* 93*  CO2 29 28  GLUCOSE 170* 167*  BUN 44* 48*  CREATININE 3.30* 3.65*  CALCIUM 8.9 9.2   Liver Function Tests: Recent Labs  Lab 11/18/2021 1115 11/18/21 0729  AST 20 20  ALT 19 21  ALKPHOS 148* 130*  BILITOT 1.1 1.4*  PROT 5.9* 6.1*  ALBUMIN 3.2* 3.4*   No results for input(s): "LIPASE", "AMYLASE" in the last 168 hours. No results for input(s): "AMMONIA" in the last 168 hours. CBC: Recent Labs  Lab 11/30/2021 1115 11/18/21 0729  WBC 3.9* 7.4  NEUTROABS 3.0  --   HGB 10.3* 10.3*  HCT 36.0 35.5*  MCV 112.9* 110.9*  PLT 87* 130*   Cardiac Enzymes: No results for input(s): "CKTOTAL", "CKMB", "CKMBINDEX", "TROPONINI" in the last 168 hours. CBG: Recent Labs  Lab 11/14/2021 1135  GLUCAP 162*   Iron Studies: No results for input(s): "IRON", "TIBC", "TRANSFERRIN", "FERRITIN" in the last 72  hours. Studies/Results: CT HEAD WO CONTRAST  Result Date: 12/06/2021 CLINICAL DATA:  An 81year old female presents with mental status changes of unknown cause. EXAM: CT HEAD WITHOUT CONTRAST TECHNIQUE: Contiguous axial images were obtained from the base of the skull through the vertex without intravenous contrast. RADIATION DOSE REDUCTION: This exam was performed according to the departmental dose-optimization program which includes automated  exposure control, adjustment of the mA and/or kV according to patient size and/or use of iterative reconstruction technique. COMPARISON:  June 04, 2021. FINDINGS: Brain: No evidence of acute infarction, hemorrhage, hydrocephalus, extra-axial collection or mass lesion/mass effect. Signs of prior RIGHT parietal-occipital infarct with encephalomalacia. Signs of cerebral atrophy. Vascular: No hyperdense vessel or unexpected calcification. Skull: Normal. Negative for fracture or focal lesion. LEFT mastoid and middle ear effusion. All mastoid air cells are opacified. No stranding adjacent to mastoid tip per signs of bony destruction. Signs of renal osteodystrophy throughout the calvarium Sinuses/Orbits: No acute finding. Other: None IMPRESSION: 1. No acute intracranial abnormality. 2. Signs of atrophy, chronic microvascular ischemic change in prior infarct with encephalomalacia. 3. LEFT mastoid and middle ear effusion. No stranding adjacent to mastoid tip or signs of bony destruction. Correlate with any signs of otitis media and any clinical signs to suggest early mastoiditis. 4. Signs of renal osteodystrophy throughout the calvarium. Electronically Signed   By: Zetta Bills M.D.   On: 11/25/2021 15:01   DG Chest Port 1 View  Result Date: 12/01/2021 CLINICAL DATA:  Provided history: Altered mental status. EXAM: PORTABLE CHEST 1 VIEW COMPARISON:  Prior chest radiographs 11/05/2021. FINDINGS: Shows for age radiograph. Cardiomegaly. Opacities within the perihilar lungs  bilaterally, and left lung base. Mild atelectasis within the right lung base. Suspected trace left pleural effusion. No evidence of pneumothorax. Degenerative changes of the spine. IMPRESSION: 1. Shallow inspiration radiograph. 2. Opacities within the perihilar lungs bilaterally, and left lung base. Findings may reflect atelectasis and/or pneumonia. 3. Minimal atelectasis within the right lung base. 4. Cardiomegaly. 5. Aortic Atherosclerosis (ICD10-I70.0). Electronically Signed   By: Kellie Simmering D.O.   On: 12/09/2021 12:14    ROS: Review of systems not obtained due to patient factors. Physical Exam: Vitals:   11/18/21 0805 11/18/21 0830 11/18/21 0900 11/18/21 0930  BP:  (!) 128/109 (!) 164/119 (!) 156/119  Pulse: (!) 113 (!) 107 74   Resp: '12 13 13 15  '$ Temp:  98.3 F (36.8 C)    TempSrc:  Rectal    SpO2: 93% 96% 95%   Weight:      Height:          Weight change:   Intake/Output Summary (Last 24 hours) at 11/18/2021 1018 Last data filed at 11/18/2021 0910 Gross per 24 hour  Intake 2097.28 ml  Output --  Net 2097.28 ml   BP (!) 156/119   Pulse 74   Temp 98.3 F (36.8 C) (Rectal)   Resp 15   Ht '5\' 7"'$  (1.702 m)   Wt 87.9 kg   SpO2 95%   BMI 30.35 kg/m  General appearance: Unresponsive, wearing BiPAP Head: Normocephalic, without obvious abnormality, atraumatic Resp: rhonchi bilaterally Cardio: regular rate and rhythm, S1, S2 normal, no murmur, click, rub or gallop GI: soft, non-tender; bowel sounds normal; no masses,  no organomegaly Extremities: edema 2+ pretibial edema and RUE AVF +T/B Dialysis Access:  OP HD orders:  [facility], TTS, 3 hours 15 minutes.  EDW 81 kg.  2K/2.5 calcium.  RUE AVF 400/500.  Meds: Mircera 200 mcg every 2 weeks, Venofer 50 mg q, calcitriol 0.5 mcg 3 times a week, Sensipar 30 mg 3 times a week.  No heparin (history of GI bleed)  Assessment/Plan:  Septic shock - unclear etiology.  On pressors and broad spectrum antibiotics  ESRD -  normally TTS  but did not complete HD yesterday due to hypotension.  She remains too unstable for HD  and would not offer CRRT given poor overall prognosis.  Recommend transition to comfort care.  Hypertension/volume  - as above on pressors  Anemia  - stable  Metabolic bone disease -   hold meds as she is unresponsive and on Bipap  Nutrition - npo for now  Disposition - she has been declining for the past 9 months with multiple hospitalizations.  She is too unstable for dialysis.  Recommend transition to comfort care.  Palliative care following.  Donetta Potts, MD [place] 11/18/2021, 10:18 AM     Pt will not be seen over the weekend but chart will be monitored remotely and on call coverage is available if needed.

## 2021-11-18 NOTE — Progress Notes (Signed)
PROGRESS NOTE  Jody [name] WFU:[id number] DOB: 30-Apr-1940 DOA: 12/11/2021 PCP: Monico Blitz, MD  Brief History:  81 year old female with history of stroke, COPD, chronic respiratory failure on 3 L, systolic and diastolic CHF, persistent atrial fibrillation, ESRD, iron deficiency presenting from her nursing facility [facility]) from which she is a permanent resident secondary to altered mental status and hypotension.  Patient was at HD the morning of admission.  After 25 min of HD, patient was hypotensive and somnolent.  Pt unable to provide history due to acute encephalopathy.   At baseline, patient is conversant although pleasantly confused occasionally.  She is bedbound at baseline.  Apparently, the patient was was her usual self prior to going to dialysis on the morning of 12/13/2021. The patient was most recently admitted to the hospital from 10/27/2021 to 11/03/21 for acute on chronic respiratory failure which was thought to be multifactorial secondary to pneumonia, atrial fibrillation with RVR, fluid overload.  The patient was discharged back to her facility with 3 more days of cefdinir and doxycycline. In the ED, the patient was hypothermic with a temperature of 93.7.  She was hypotensive initially with blood pressure 57/43.  The patient was placed on nonrebreather with saturation 100%.  Chest x-ray showed bilateral perihilar opacities.  ABG showed seven-point 1/96/190/29 on 100%.  The patient was started on Levophed 5 mcg/kg/min.  Vancomycin and Zosyn were started.  EKG showed atrial fibrillation with right bundle branch block.  I had a goals of care discussion with the patient's niece who is her POA.  She confirms the patient is DNR status.  she wants to continue aggressive care at least for the next 24 hours.  I requested Dr. Sabra Heck the place central line.  BiPAP was ordered.  11/18/21--patient is obtunded, minimally responsive to protopathic stimuli--occasional withdraw.  Levophed  up 5 mcg>>45mg.  Remains on BiPAP with no improvement on ABG Discussed GOC with niece.  Niece felt patient was awake and conversant and recognized her last night.  She states pt was able to communicate her needs.  This was not corroborated by nursing staff.  Niece wants to continue care and will visit patient this evening to confirm my findings of clinical worsening.  Nephrology consulted.  PCCM following.  Palliative saw patient.    Assessment/Plan: Septic shock -likely due to  pneumonia -presented with hypothermia, hypotension, altered mental status -lactic acid1.0 -PCT0.74>>0.80 -continue empiric vanc/zosyn -follow blood culture -levophed dose up from 557m>>9 mcg   Acute on chronic respiratory failure with hypoxia and hypercapnia (HCC) Personally reviewed CXR--increased interstitial markings; bilateral perihilar opacities remain on BiPAP Chronic on 3-4 L 10/5 ABG 7.1/96/190/29 (1.0) 10/6 ABG 7.15/85/95/29 (0.4) Appreciate PCCM   Lobar Pneumonia -started on zosyn, vanc  Acute metabolic Encephalopathy -patient is obtunded, less responsive than 11/24/2021 -occasional withdraw to protopathic stimuli -due to sepsis and hypercarbia   [facility]   Reports compliance with her HD sessions. -discussed with nephrology -pt is not able to undergo HD presently due to hypotension   Acute on chronic combined systolic and diastolic CHF (congestive heart failure) (HCC) - Manage volume with HD -06/03/21 Echo EF 50%, RV overload, severe TR -08/02/20 Echo EF 45-50% HK inferoseptal, inferolateral, Inf wall; mod TR --10/24/21 Echo-EF 40-45%, global HK, RV overload, PASP 57.5 --clinically fluid overloaded presently --hold metoprolol succinate due to hypotension   Dysphagia -evaluated by speech last admission -continue dys 2 diet with thin liquids  last admission -currently not safe for po intake   Persistent atrial fibrillation (HCC) On  anticoagulation with Eliquis. Rate Controlled, heart rate 50s to 90s.  Not on rate limiting agents. -transitioned to [facility] lovenox - Resume Eliquis when able to tolerate po   Essential hypertension Previously on metoprolol>>dose was decreased to 12.5 mg daily Off metoprolol due to hypotension   Thrombocytopenia (HCC) Fairly chronic dating back to Feb 2023 -B12--1435 -folate-7.0 -TSH--1.132 -acute worsening due to sepsis   PAD (peripheral artery disease) (HCC) - Continue statin and ASA when able to tolerate po     Goals of care, counseling/discussion Discussed with the patient's POA, Jody [name] last admission 10/23/21 Discussed GOC again with Jody [name] 10/5>>confirmed DNR -- I discussed the patient's overall poor prognosis given her frailty, multiple comorbid conditions, numerous hospitalizations and poor functional baseline -10/6 discussed again with niece Jody [name]--updated her that patient is clinically worse than 10/5>>she wishes to continue full care for now    Family Communication:   Niece and cousin updated  Consultants:  renal, PCCM, palliative  Code Status:  DNR  DVT Prophylaxis:  [place] Lovenox   Procedures: As Listed in Progress Note Above  Antibiotics: Vanc 10/5>. Zosy 10/5>>      Subjective: Patient is obtunded.  Occasional withdraw to pain.  Unable to obtain ROS  Objective: Vitals:   11/18/21 0715 11/18/21 0730 11/18/21 0800 11/18/21 0830  BP:  103/60 113/65 (!) 128/109  Pulse: (!) 42 80 (!) 58 (!) 107  Resp: '12 14 13 13  '$ Temp:    98.3 F (36.8 C)  TempSrc:    Rectal  SpO2: 90% 93% 93% 96%  Weight:      Height:        Intake/Output Summary (Last 24 hours) at 11/18/2021 0854 Last data filed at 11/18/2021 0742 Gross per 24 hour  Intake 2048.33 ml  Output --  Net 2048.33 ml   Weight change:  Exam:  General:  Pt is obtunded, does not follow commands appropriately, occasional withdrawal to pain HEENT: No icterus, No thrush, No neck mass,  [place]/AT Cardiovascular: RRR, S1/S2, no rubs, no gallops Respiratory: scattered rales.  No wheeze Abdomen: Soft/+BS, non tender, non distended, no guarding Extremities: 2 + LE edema, No lymphangitis, No petechiae, No rashes, no synovitis   Data Reviewed: I have personally reviewed following labs and imaging studies Basic Metabolic Panel: Recent Labs  Lab 11/15/2021 1115 11/18/21 0729  NA 132* 132*  K 4.5 5.0  CL 94* 93*  CO2 29 28  GLUCOSE 170* 167*  BUN 44* 48*  CREATININE 3.30* 3.65*  CALCIUM 8.9 9.2   Liver Function Tests: Recent Labs  Lab 12/03/2021 1115 11/18/21 0729  AST 20 20  ALT 19 21  ALKPHOS 148* 130*  BILITOT 1.1 1.4*  PROT 5.9* 6.1*  ALBUMIN 3.2* 3.4*   No results for input(s): "LIPASE", "AMYLASE" in the last 168 hours. No results for input(s): "AMMONIA" in the last 168 hours. Coagulation Profile: Recent Labs  Lab 11/15/2021 1115  INR 1.3*   CBC: Recent Labs  Lab 12/01/2021 1115 11/18/21 0729  WBC 3.9* 7.4  NEUTROABS 3.0  --   HGB 10.3* 10.3*  HCT 36.0 35.5*  MCV 112.9* 110.9*  PLT 87* 130*   Cardiac Enzymes: No results for input(s): "CKTOTAL", "CKMB", "CKMBINDEX", "TROPONINI" in the last 168 hours. BNP: Invalid input(s): "POCBNP" CBG: Recent Labs  Lab 11/24/2021 1135  GLUCAP 162*   HbA1C: No results for input(s): "HGBA1C" in the last 72  hours. Urine analysis:    Component Value Date/Time   COLORURINE YELLOW 05/27/2014 1902   APPEARANCEUR CLEAR 05/27/2014 1902   LABSPEC 1.012 05/27/2014 1902   PHURINE 5.0 05/27/2014 1902   GLUCOSEU NEGATIVE 05/27/2014 1902   HGBUR NEGATIVE 05/27/2014 1902   BILIRUBINUR NEGATIVE 05/27/2014 1902   KETONESUR NEGATIVE 05/27/2014 1902   PROTEINUR 100 (A) 05/27/2014 1902   UROBILINOGEN 0.2 05/27/2014 1902   NITRITE NEGATIVE 05/27/2014 1902   LEUKOCYTESUR NEGATIVE 05/27/2014 1902   Sepsis Labs: '@LABRCNTIP'$ (procalcitonin:4,lacticidven:4) ) Recent Results (from the past 240 hour(s))  Culture, blood (Routine  X 2) w Reflex to ID Panel     Status: None (Preliminary result)   Collection Time: 11/25/2021  3:58 PM   Specimen: BLOOD RIGHT WRIST  Result Value Ref Range Status   Specimen Description BLOOD RIGHT WRIST  Final   Special Requests   Final    BOTTLES DRAWN AEROBIC AND ANAEROBIC Blood Culture adequate volume   Culture   Final    NO GROWTH < 24 HOURS Performed at [facility], [address], [place], [place] [address]    Report Status PENDING  Incomplete  Culture, blood (Routine X 2) w Reflex to ID Panel     Status: None (Preliminary result)   Collection Time: 11/14/2021  4:14 PM   Specimen: BLOOD RIGHT HAND  Result Value Ref Range Status   Specimen Description BLOOD RIGHT HAND  Final   Special Requests   Final    BOTTLES DRAWN AEROBIC AND ANAEROBIC Blood Culture adequate volume   Culture   Final    NO GROWTH < 24 HOURS Performed at [facility], [address] [place], [place] [address]    Report Status PENDING  Incomplete     Scheduled Meds:  Chlorhexidine Gluconate Cloth  6 each Topical Daily   enoxaparin (LOVENOX) injection  1 mg/kg Subcutaneous Q24H   mouth rinse  15 mL Mouth Rinse 4 times per day   vancomycin variable dose per unstable renal function (pharmacist dosing)   Does not apply See admin instructions   Continuous Infusions:  norepinephrine (LEVOPHED) Adult infusion 9 mcg/min (11/18/21 0742)   piperacillin-tazobactam (ZOSYN)  IV 2.25 g (11/18/21 0125)    Procedures/Studies: CT HEAD WO CONTRAST  Result Date: 11/21/2021 CLINICAL DATA:  An 81 year old female presents with mental status changes of unknown cause. EXAM: CT HEAD WITHOUT CONTRAST TECHNIQUE: Contiguous axial images were obtained from the base of the skull through the vertex without intravenous contrast. RADIATION DOSE REDUCTION: This exam was performed according to the departmental dose-optimization program which includes automated exposure control, adjustment of the mA and/or kV according to patient size and/or  use of iterative reconstruction technique. COMPARISON:  June 04, 2021. FINDINGS: Brain: No evidence of acute infarction, hemorrhage, hydrocephalus, extra-axial collection or mass lesion/mass effect. Signs of prior RIGHT parietal-occipital infarct with encephalomalacia. Signs of cerebral atrophy. Vascular: No hyperdense vessel or unexpected calcification. Skull: Normal. Negative for fracture or focal lesion. LEFT mastoid and middle ear effusion. All mastoid air cells are opacified. No stranding adjacent to mastoid tip per signs of bony destruction. Signs of renal osteodystrophy throughout the calvarium Sinuses/Orbits: No acute finding. Other: None IMPRESSION: 1. No acute intracranial abnormality. 2. Signs of atrophy, chronic microvascular ischemic change in prior infarct with encephalomalacia. 3. LEFT mastoid and middle ear effusion. No stranding adjacent to mastoid tip or signs of bony destruction. Correlate with any signs of otitis media and any clinical signs to suggest early mastoiditis. 4. Signs of renal osteodystrophy  throughout the calvarium. Electronically Signed   By: Zetta Bills M.D.   On: 12/13/2021 15:01   DG Chest Port 1 View  Result Date: 11/18/2021 CLINICAL DATA:  Provided history: Altered mental status. EXAM: PORTABLE CHEST 1 VIEW COMPARISON:  Prior chest radiographs 11/05/2021. FINDINGS: Shows for age radiograph. Cardiomegaly. Opacities within the perihilar lungs bilaterally, and left lung base. Mild atelectasis within the right lung base. Suspected trace left pleural effusion. No evidence of pneumothorax. Degenerative changes of the spine. IMPRESSION: 1. Shallow inspiration radiograph. 2. Opacities within the perihilar lungs bilaterally, and left lung base. Findings may reflect atelectasis and/or pneumonia. 3. Minimal atelectasis within the right lung base. 4. Cardiomegaly. 5. Aortic Atherosclerosis (ICD10-I70.0). Electronically Signed   By: Kellie Simmering D.O.   On: 11/21/2021 12:14   DG  Chest Portable 1 View  Result Date: 11/05/2021 CLINICAL DATA:  Hypoxia EXAM: PORTABLE CHEST 1 VIEW COMPARISON:  11/02/2021 chest radiograph. FINDINGS: Low lung volumes. Stable cardiomediastinal silhouette with mild cardiomegaly. No pneumothorax. No significant pleural effusions. No overt pulmonary edema. Patchy bibasilar lung opacities, similar. IMPRESSION: 1. Stable cardiomegaly without overt pulmonary edema. 2. Low lung volumes with patchy bibasilar lung opacities, similar, favor atelectasis, with component of aspiration or pneumonia not excluded. Electronically Signed   By: Ilona Sorrel M.D.   On: 11/05/2021 17:53   DG CHEST PORT 1 VIEW  Result Date: 11/02/2021 CLINICAL DATA:  Respiratory distress EXAM: PORTABLE CHEST 1 VIEW COMPARISON:  10/27/2021 FINDINGS: Poor inspiration. Cardiomegaly and aortic atherosclerosis. Pulmonary venous hypertension with mild interstitial edema. Small effusions and lower lobe atelectasis. IMPRESSION: Findings most consistent with congestive heart failure. Cannot rule out coexistent pneumonia in the lower lobes. Similar appearance compared to the study of 09/14. There may be slightly worse volume loss at the left base. Electronically Signed   By: Nelson Chimes M.D.   On: 11/02/2021 13:36   DG Chest Port 1 View  Result Date: 10/27/2021 CLINICAL DATA:  Shortness of breath EXAM: PORTABLE CHEST 1 VIEW COMPARISON:  Radiographs 10/23/2021 FINDINGS: Stable cardiomegaly. Right basilar atelectasis/consolidation. Interstitial markings are prominent bilaterally likely due to low lung volumes and chronic interstitial lung disease. Question trace pleural effusions bilaterally are pleural thickening. No pneumothorax. Elevated right hemidiaphragm. No acute osseous abnormality. Aortic atherosclerotic calcification. IMPRESSION: Elevated right hemidiaphragm with associated presumed atelectasis though pneumonia is difficult to exclude. Hypoinflation and chronic interstitial prominence.  Bilateral trace pleural effusions or pleural thickening. Cardiomegaly. Electronically Signed   By: Placido Sou M.D.   On: 10/27/2021 19:15   ECHOCARDIOGRAM LIMITED  Result Date: 10/23/2021    ECHOCARDIOGRAM LIMITED REPORT   Patient Name:   JAIDEN WAHAB Date of Exam: 10/23/2021 Medical Rec #:  [id number]       Height:       62.0 in Accession #:    [id number]      Weight:       174.8 lb Date of Birth:  03-Jan-1941       BSA:          1.805 m Patient Age:    32 years        BP:           124/73 mmHg Patient Gender: F               HR:           99 bpm. Exam Location:  [facility] Procedure: Limited Echo, Limited Color Doppler and Cardiac Doppler Indications:    CHF-Acute Diastolic P29.51  History:  Patient has prior history of Echocardiogram examinations, most                 recent 06/03/2021. CHF, Previous Myocardial Infarction, Stroke                 and COPD, Arrythmias:Atrial Fibrillation; Risk                 Factors:Hypertension, Diabetes and Dyslipidemia. Hx of ESRD on                 dialysis,.  Sonographer:    Alvino Chapel RCS Referring Phys: [phone] Ludwin Flahive IMPRESSIONS  1. Left ventricular ejection fraction, by estimation, is 40 to 45%. The left ventricle has mildly decreased function. The left ventricle demonstrates global hypokinesis. There is moderate concentric left ventricular hypertrophy. Left ventricular diastolic function could not be evaluated. There is the interventricular septum is flattened in systole and diastole, consistent with right ventricular pressure and volume overload.  2. Right ventricular systolic function is moderately reduced. The right ventricular size is normal. A Prominent moderator band is visualized. There is moderately elevated pulmonary artery systolic pressure. The estimated right ventricular systolic pressure is 70.3 mmHg.  3. Left atrial size was severely dilated.  4. Right atrial size was severely dilated.  5. The mitral valve is degenerative. Moderate mitral  annular calcification.  6. Tricuspid valve regurgitation is moderate.  7. The aortic valve is tricuspid. There is moderate calcification of the aortic valve. There is moderate thickening of the aortic valve. No aortic stenosis is present.  8. The inferior vena cava is dilated in size with <50% respiratory variability, suggesting right atrial pressure of 15 mmHg. FINDINGS  Left Ventricle: Left ventricular ejection fraction, by estimation, is 40 to 45%. The left ventricle has mildly decreased function. The left ventricle demonstrates global hypokinesis. The left ventricular internal cavity size was normal in size. There is  moderate concentric left ventricular hypertrophy. Abnormal (paradoxical) septal motion, consistent with left bundle branch block and the interventricular septum is flattened in systole and diastole, consistent with right ventricular pressure and volume overload. Left ventricular diastolic function could not be evaluated. Left ventricular diastolic function could not be evaluated due to atrial fibrillation. Right Ventricle: The right ventricular size is normal. No increase in right ventricular wall thickness. Right ventricular systolic function is moderately reduced. There is moderately elevated pulmonary artery systolic pressure. The tricuspid regurgitant velocity is 3.26 m/s, and with an assumed right atrial pressure of 15 mmHg, the estimated right ventricular systolic pressure is 50.0 mmHg. Left Atrium: Left atrial size was severely dilated. Right Atrium: Right atrial size was severely dilated. Pericardium: There is no evidence of pericardial effusion. Mitral Valve: The mitral valve is degenerative in appearance. There is moderate thickening of the mitral valve leaflet(s). There is moderate calcification of the mitral valve leaflet(s). Moderate mitral annular calcification. Tricuspid Valve: The tricuspid valve is normal in structure. Tricuspid valve regurgitation is moderate . No evidence of  tricuspid stenosis. Aortic Valve: The aortic valve is tricuspid. There is moderate calcification of the aortic valve. There is moderate thickening of the aortic valve. No aortic stenosis is present. Pulmonic Valve: The pulmonic valve was normal in structure. Pulmonic valve regurgitation is not visualized. No evidence of pulmonic stenosis. Aorta: The aortic root is normal in size and structure. Venous: The inferior vena cava is dilated in size with less than 50% respiratory variability, suggesting right atrial pressure of 15 mmHg. IAS/Shunts: No atrial level shunt detected by  color flow Doppler. Additional Comments: A Prominent moderator band is visualized. LEFT VENTRICLE PLAX 2D LVIDd:         4.10 cm LVIDs:         3.50 cm LV PW:         1.50 cm LV IVS:        1.40 cm LVOT diam:     1.80 cm LVOT Area:     2.54 cm  LV Volumes (MOD) LV vol d, MOD A2C: 78.4 ml LV vol d, MOD A4C: 47.9 ml LV vol s, MOD A2C: 40.6 ml LV vol s, MOD A4C: 27.3 ml LV SV MOD A2C:     37.8 ml LV SV MOD A4C:     47.9 ml LV SV MOD BP:      30.1 ml RIGHT VENTRICLE TAPSE (M-mode): 1.4 cm LEFT ATRIUM         Index LA diam:    4.50 cm 2.49 cm/m   AORTA Ao Root diam: 3.60 cm TRICUSPID VALVE TR Peak grad:   42.5 mmHg TR Vmax:        326.00 cm/s  SHUNTS Systemic Diam: 1.80 cm Fransico Him MD Electronically signed by Fransico Him MD Signature Date/Time: 10/23/2021/2:12:30 PM    Final    DG CHEST PORT 1 VIEW  Result Date: 10/23/2021 CLINICAL DATA:  Respiratory distress. EXAM: PORTABLE CHEST 1 VIEW COMPARISON:  Chest x-rays dated 10/20/2021, 09/06/2021 and 09/05/2021. FINDINGS: Stable cardiomegaly. Lungs are stable. No confluence opacity to suggest a developing pneumonia. No evidence of overt alveolar pulmonary edema. Coarse lung markings bilaterally suggests chronic interstitial lung disease/fibrosis. There is also chronic elevation of the RIGHT hemidiaphragm. No acute-appearing osseous abnormality. IMPRESSION: 1. No active disease. No evidence of  pneumonia or alveolar pulmonary edema. 2. Stable cardiomegaly. 3. Probable chronic interstitial lung disease/fibrosis. 4. Stable pleural thickening/fluid at the RIGHT lung fissure. Electronically Signed   By: Franki Cabot M.D.   On: 10/23/2021 11:13   DG Chest Portable 1 View  Result Date: 10/20/2021 CLINICAL DATA:  Chest pain. Shortness of breath. Symptoms began during dialysis. EXAM: PORTABLE CHEST 1 VIEW COMPARISON:  One-view chest x-ray 09/06/2021 FINDINGS: The heart is large. Atherosclerotic calcifications are present at the aortic arch. Mild diffuse interstitial pattern is present. No significant scratched at no focal airspace consolidation is present. Chronic elevation of the right hemidiaphragm is noted. Lung volumes are low. Marked degenerative changes are present in both shoulders. IMPRESSION: 1. Cardiomegaly with mild interstitial edema. 2. No focal airspace disease. Electronically Signed   By: San Morelle M.D.   On: 10/20/2021 14:42    Orson Eva, DO  [facility]  If 7PM-7AM, please contact night-coverage [email or web address] Password [email or web address] 11/18/2021, 8:54 AM   LOS: 1 day

## 2021-11-19 DIAGNOSIS — I4819 Other persistent atrial fibrillation: Secondary | ICD-10-CM | POA: Diagnosis not present

## 2021-11-19 DIAGNOSIS — R6521 Severe sepsis with septic shock: Secondary | ICD-10-CM | POA: Diagnosis not present

## 2021-11-19 DIAGNOSIS — A419 Sepsis, unspecified organism: Secondary | ICD-10-CM | POA: Diagnosis not present

## 2021-11-19 DIAGNOSIS — J9602 Acute respiratory failure with hypercapnia: Secondary | ICD-10-CM | POA: Diagnosis not present

## 2021-11-19 LAB — CBC
HCT: 33.7 % — ABNORMAL LOW (ref 36.0–46.0)
Hemoglobin: 9.9 g/dL — ABNORMAL LOW (ref 12.0–15.0)
MCH: 32.2 pg (ref 26.0–34.0)
MCHC: 29.4 g/dL — ABNORMAL LOW (ref 30.0–36.0)
MCV: 109.8 fL — ABNORMAL HIGH (ref 80.0–100.0)
Platelets: 107 10*3/uL — ABNORMAL LOW (ref 150–400)
RBC: 3.07 MIL/uL — ABNORMAL LOW (ref 3.87–5.11)
RDW: 17.3 % — ABNORMAL HIGH (ref 11.5–15.5)
WBC: 4.5 10*3/uL (ref 4.0–10.5)
nRBC: 0 % (ref 0.0–0.2)

## 2021-11-19 LAB — PROCALCITONIN: Procalcitonin: 1 ng/mL

## 2021-11-19 LAB — COMPREHENSIVE METABOLIC PANEL
ALT: 19 U/L (ref 0–44)
AST: 17 U/L (ref 15–41)
Albumin: 3.3 g/dL — ABNORMAL LOW (ref 3.5–5.0)
Alkaline Phosphatase: 106 U/L (ref 38–126)
Anion gap: 14 (ref 5–15)
BUN: 54 mg/dL — ABNORMAL HIGH (ref 8–23)
CO2: 26 mmol/L (ref 22–32)
Calcium: 9.3 mg/dL (ref 8.9–10.3)
Chloride: 92 mmol/L — ABNORMAL LOW (ref 98–111)
Creatinine, Ser: 3.89 mg/dL — ABNORMAL HIGH (ref 0.44–1.00)
GFR, Estimated: 11 mL/min — ABNORMAL LOW (ref >60)
Glucose, Bld: 124 mg/dL — ABNORMAL HIGH (ref 70–99)
Potassium: 5.1 mmol/L (ref 3.5–5.1)
Sodium: 132 mmol/L — ABNORMAL LOW (ref 135–145)
Total Bilirubin: 1.7 mg/dL — ABNORMAL HIGH (ref 0.3–1.2)
Total Protein: 5.9 g/dL — ABNORMAL LOW (ref 6.5–8.1)

## 2021-11-19 NOTE — Progress Notes (Signed)
Cancelled ABG for this morning.  Per Halford Chessman, MD note, he had detailed discussion with family present.  They were all in agreement that patient has been suffering and there is not any realistic chance she will improve to an acceptable functioning status.  They will stop Bipap without plans to resume.  Continue other medications and supplemental O2 for now.  If status worsens, plan is to transition to comfort measures.  Code status DNR/DNI.

## 2021-11-19 NOTE — Plan of Care (Signed)

## 2021-11-19 NOTE — Progress Notes (Signed)
Femoral central line site was bleeding, looked like patient may have been messing with it. Dressing changed with sterile technique.

## 2021-11-19 NOTE — Progress Notes (Signed)
PROGRESS NOTE  Clarine Elrod JKK:[id number] DOB: Apr 26, 1940 DOA: 12/04/2021 PCP: Monico Blitz, MD  Brief History:  81 year old female with history of stroke, COPD, chronic respiratory failure on 3 L, systolic and diastolic CHF, persistent atrial fibrillation, ESRD, iron deficiency presenting from her nursing facility [facility]) from which she is a permanent resident secondary to altered mental status and hypotension.  Patient was at HD the morning of admission.  After 25 min of HD, patient was hypotensive and somnolent.  Pt unable to provide history due to acute encephalopathy.   At baseline, patient is conversant although pleasantly confused occasionally.  She is bedbound at baseline.  Apparently, the patient was was her usual self prior to going to dialysis on the morning of 11/28/2021. The patient was most recently admitted to the hospital from 10/27/2021 to 11/03/21 for acute on chronic respiratory failure which was thought to be multifactorial secondary to pneumonia, atrial fibrillation with RVR, fluid overload.  The patient was discharged back to her facility with 3 more days of cefdinir and doxycycline. In the ED, the patient was hypothermic with a temperature of 93.7.  She was hypotensive initially with blood pressure 57/43.  The patient was placed on nonrebreather with saturation 100%.  Chest x-ray showed bilateral perihilar opacities.  ABG showed seven-point 1/96/190/29 on 100%.  The patient was started on Levophed 5 mcg/kg/min.  Vancomycin and Zosyn were started.  EKG showed atrial fibrillation with right bundle branch block.  I had a goals of care discussion with the patient's niece who is her POA.  She confirms the patient is DNR status.  she wants to continue aggressive care at least for the next 24 hours.  I requested Dr. Sabra Heck the place central line.  BiPAP was ordered.  11/18/21--patient is obtunded, minimally responsive to protopathic stimuli--occasional withdraw.  Levophed  up 5 mcg>>10mg.  Remains on BiPAP with no improvement on ABG Discussed GOC with niece.  Niece felt patient was awake and conversant and recognized her last night.  She states pt was able to communicate her needs.  This was not corroborated by nursing staff.  Niece wants to continue care and will visit patient this evening to confirm my findings of clinical worsening.  Nephrology consulted.  PCCM following.  Palliative saw patient.  11/19/21--discussed with Dr. SHalford Chessmanand Ms. Dalton on 10/6 and Ms.Dalton again on 10/7.  Agreed to take pt off BiPAP with no plan to resume it --patient is confused, waxing and waning out of consciousness. Remains on levophed --discussed with Ms. Dalton that patient is gradually and continuing declining medically and continuing medications with curative intent (antibiotics) and vasopressor will prolong patient's suffering.  Ms DKirk Ruthsagreed that pt is suffering and did not want to prolong the process.  She wanted patient to be comfortable and allow "the Lord to take her when it's time".  She agreed for antibiotic discontinuation.    Assessment/Plan:  Septic shock -likely due to  pneumonia -presented with hypothermia, hypotension, altered mental status -lactic acid1.0 -PCT0.74>>0.80 -continue empiric vanc/zosyn -follow blood culture neg -levophed continues per family request   Acute on chronic respiratory failure with hypoxia and hypercapnia ([place] Personally reviewed CXR--increased interstitial markings; bilateral perihilar opacities remain off BiPAP Chronic on 3-4 L 10/5 ABG 7.1/96/190/29 (1.0) 10/6 ABG 7.15/85/95/29 (0.4) Appreciate PCCM   Lobar Pneumonia -discontinue on zosyn, vanc and we are transitioning to a focus of comfort measures   Acute metabolic Encephalopathy -patient is obtunded, less  responsive than 12/01/2021 -occasional withdraw to protopathic stimuli -due to sepsis and hypercarbia   ESRD on dialysis [facility]) Schedule--Tuesday Thursday Saturday.    Reports compliance with her HD sessions. -discussed with nephrology -pt is not dialysis or CRRT candidate   Acute on chronic combined systolic and diastolic CHF (congestive heart failure) (HCC) - Manage volume with HD -06/03/21 Echo EF 50%, RV overload, severe TR -08/02/20 Echo EF 45-50% HK inferoseptal, inferolateral, Inf wall; mod TR --10/24/21 Echo-EF 40-45%, global HK, RV overload, PASP 57.5 --clinically fluid overloaded presently --hold metoprolol succinate due to hypotension   Dysphagia -evaluated by speech last admission -continue dys 2 diet with thin liquids last admission -currently not safe for po intake   Persistent atrial fibrillation ([place]) On anticoagulation with Eliquis. Rate Controlled, heart rate 50s to 90s.  Not on rate limiting agents. -transitioned to [facility] lovenox - Resume Eliquis when able to tolerate po -now transitioning to more comfort focused approach   Essential hypertension Previously on metoprolol>>dose was decreased to 12.5 mg daily Off metoprolol due to hypotension   Thrombocytopenia (HCC) Fairly chronic dating back to Feb 2023 -B12--1435 -folate-7.0 -TSH--1.132 -acute worsening due to sepsis   PAD (peripheral artery disease) (HCC) - Continue statin and ASA when able to tolerate po     Goals of care, counseling/discussion Discussed with the patient's POA, [name] last admission 10/23/21 Discussed GOC again with [name] 10/5>>confirmed DNR -- I discussed the patient's overall poor prognosis given her frailty, multiple comorbid conditions, numerous hospitalizations and poor functional baseline -10/6 discussed again with niece [name]--updated her that patient is clinically worse than 10/5>>she wishes to continue full care for now - 10/7 discussed with Ms. Dalton--see above     Family Communication:   Niece  updated 10/7   Consultants:  renal, PCCM, palliative   Code Status:  DNR   DVT Prophylaxis:  [place] Lovenox      Procedures: As Listed in Progress Note Above   Antibiotics: Vanc 10/5>.10/7 Zosy 10/5>>10/7         Subjective: Patient mumbles incoherently.    Objective: Vitals:   11/19/21 1600 11/19/21 1601 11/19/21 1630 11/19/21 1648  BP:  104/69 111/85   Pulse: 67 91 69 (!) 101  Resp: 19 (!) 5 15 (!) 22  Temp:  (!) 96.2 F (35.7 C)    TempSrc:  Axillary    SpO2: 93% (!) 89% (!) 88% 92%  Weight:      Height:        Intake/Output Summary (Last 24 hours) at 11/19/2021 1659 Last data filed at 11/19/2021 1455 Gross per 24 hour  Intake 658.14 ml  Output --  Net 658.14 ml   Weight change: 9.4 kg Exam:  General:  Pt is mumbles incoherently.  NAD.  Does not follow commands HEENT: No icterus, No thrush, No neck mass, [place]/AT Cardiovascular: RRR, S1/S2, no rubs, no gallops Respiratory: bilateral rales Abdomen: Soft/+BS, non tender, non distended, no guarding Extremities: 2 +L@ edema, No lymphangitis, No petechiae, No rashes, no synovitis   Data Reviewed: I have personally reviewed following labs and imaging studies Basic Metabolic Panel: Recent Labs  Lab 11/20/2021 1115 11/18/21 0729 11/19/21 0415  NA 132* 132* 132*  K 4.5 5.0 5.1  CL 94* 93* 92*  CO2 '29 28 26  '$ GLUCOSE 170* 167* 124*  BUN 44* 48* 54*  CREATININE 3.30* 3.65* 3.89*  CALCIUM 8.9 9.2 9.3   Liver Function Tests: Recent Labs  Lab 12/13/2021 1115 11/18/21 0729 11/19/21 0415  AST '20 20 17  '$ ALT '19 21 19  '$ ALKPHOS 148* 130* 106  BILITOT 1.1 1.4* 1.7*  PROT 5.9* 6.1* 5.9*  ALBUMIN 3.2* 3.4* 3.3*   No results for input(s): "LIPASE", "AMYLASE" in the last 168 hours. No results for input(s): "AMMONIA" in the last 168 hours. Coagulation Profile: Recent Labs  Lab 12/08/2021 1115  INR 1.3*   CBC: Recent Labs  Lab 11/23/2021 1115 11/18/21 0729 11/19/21 0415  WBC 3.9* 7.4 4.5  NEUTROABS 3.0  --   --   HGB 10.3* 10.3* 9.9*  HCT 36.0 35.5* 33.7*  MCV 112.9* 110.9* 109.8*  PLT 87* 130* 107*   Cardiac  Enzymes: No results for input(s): "CKTOTAL", "CKMB", "CKMBINDEX", "TROPONINI" in the last 168 hours. BNP: Invalid input(s): "POCBNP" CBG: Recent Labs  Lab 12/11/2021 1135  GLUCAP 162*   HbA1C: No results for input(s): "HGBA1C" in the last 72 hours. Urine analysis:    Component Value Date/Time   COLORURINE YELLOW 05/27/2014 1902   APPEARANCEUR CLEAR 05/27/2014 1902   LABSPEC 1.012 05/27/2014 1902   PHURINE 5.0 05/27/2014 1902   GLUCOSEU NEGATIVE 05/27/2014 1902   HGBUR NEGATIVE 05/27/2014 1902   BILIRUBINUR NEGATIVE 05/27/2014 1902   KETONESUR NEGATIVE 05/27/2014 1902   PROTEINUR 100 (A) 05/27/2014 1902   UROBILINOGEN 0.2 05/27/2014 1902   NITRITE NEGATIVE 05/27/2014 1902   LEUKOCYTESUR NEGATIVE 05/27/2014 1902   Sepsis Labs: '@LABRCNTIP'$ (procalcitonin:4,lacticidven:4) ) Recent Results (from the past 240 hour(s))  Culture, blood (Routine X 2) w Reflex to ID Panel     Status: None (Preliminary result)   Collection Time: 11/15/2021  3:58 PM   Specimen: BLOOD RIGHT WRIST  Result Value Ref Range Status   Specimen Description BLOOD RIGHT WRIST  Final   Special Requests   Final    BOTTLES DRAWN AEROBIC AND ANAEROBIC Blood Culture adequate volume   Culture   Final    NO GROWTH 2 DAYS Performed at [facility], [address] [place], [place] [address]    Report Status PENDING  Incomplete  Culture, blood (Routine X 2) w Reflex to ID Panel     Status: None (Preliminary result)   Collection Time: 12/13/2021  4:14 PM   Specimen: BLOOD RIGHT HAND  Result Value Ref Range Status   Specimen Description BLOOD RIGHT HAND  Final   Special Requests   Final    BOTTLES DRAWN AEROBIC AND ANAEROBIC Blood Culture adequate volume   Culture   Final    NO GROWTH 2 DAYS Performed at [facility], [address] [place], [place] [address]    Report Status PENDING  Incomplete     Scheduled Meds:  Chlorhexidine Gluconate Cloth  6 each Topical Daily   enoxaparin (LOVENOX) injection  1 mg/kg  Subcutaneous Q24H   mouth rinse  15 mL Mouth Rinse 4 times per day   Continuous Infusions:  norepinephrine (LEVOPHED) Adult infusion 3 mcg/min (11/19/21 1455)    Procedures/Studies: CT HEAD WO CONTRAST  Result Date: 11/16/2021 CLINICAL DATA:  An 81 year old female presents with mental status changes of unknown cause. EXAM: CT HEAD WITHOUT CONTRAST TECHNIQUE: Contiguous axial images were obtained from the base of the skull through the vertex without intravenous contrast. RADIATION DOSE REDUCTION: This exam was performed according to the departmental dose-optimization program which includes automated exposure control, adjustment of the mA and/or kV according to patient size and/or use of iterative reconstruction technique. COMPARISON:  June 04, 2021. FINDINGS: Brain: No evidence of acute infarction, hemorrhage, hydrocephalus, extra-axial collection or mass  lesion/mass effect. Signs of prior RIGHT parietal-occipital infarct with encephalomalacia. Signs of cerebral atrophy. Vascular: No hyperdense vessel or unexpected calcification. Skull: Normal. Negative for fracture or focal lesion. LEFT mastoid and middle ear effusion. All mastoid air cells are opacified. No stranding adjacent to mastoid tip per signs of bony destruction. Signs of renal osteodystrophy throughout the calvarium Sinuses/Orbits: No acute finding. Other: None IMPRESSION: 1. No acute intracranial abnormality. 2. Signs of atrophy, chronic microvascular ischemic change in prior infarct with encephalomalacia. 3. LEFT mastoid and middle ear effusion. No stranding adjacent to mastoid tip or signs of bony destruction. Correlate with any signs of otitis media and any clinical signs to suggest early mastoiditis. 4. Signs of renal osteodystrophy throughout the calvarium. Electronically Signed   By: Zetta Bills M.D.   On: 11/30/2021 15:01   DG Chest Port 1 View  Result Date: 11/30/2021 CLINICAL DATA:  Provided history: Altered mental status. EXAM:  PORTABLE CHEST 1 VIEW COMPARISON:  Prior chest radiographs 11/05/2021. FINDINGS: Shows for age radiograph. Cardiomegaly. Opacities within the perihilar lungs bilaterally, and left lung base. Mild atelectasis within the right lung base. Suspected trace left pleural effusion. No evidence of pneumothorax. Degenerative changes of the spine. IMPRESSION: 1. Shallow inspiration radiograph. 2. Opacities within the perihilar lungs bilaterally, and left lung base. Findings may reflect atelectasis and/or pneumonia. 3. Minimal atelectasis within the right lung base. 4. Cardiomegaly. 5. Aortic Atherosclerosis (ICD10-I70.0). Electronically Signed   By: Kellie Simmering D.O.   On: 11/24/2021 12:14   DG Chest Portable 1 View  Result Date: 11/05/2021 CLINICAL DATA:  Hypoxia EXAM: PORTABLE CHEST 1 VIEW COMPARISON:  11/02/2021 chest radiograph. FINDINGS: Low lung volumes. Stable cardiomediastinal silhouette with mild cardiomegaly. No pneumothorax. No significant pleural effusions. No overt pulmonary edema. Patchy bibasilar lung opacities, similar. IMPRESSION: 1. Stable cardiomegaly without overt pulmonary edema. 2. Low lung volumes with patchy bibasilar lung opacities, similar, favor atelectasis, with component of aspiration or pneumonia not excluded. Electronically Signed   By: Ilona Sorrel M.D.   On: 11/05/2021 17:53   DG CHEST PORT 1 VIEW  Result Date: 11/02/2021 CLINICAL DATA:  Respiratory distress EXAM: PORTABLE CHEST 1 VIEW COMPARISON:  10/27/2021 FINDINGS: Poor inspiration. Cardiomegaly and aortic atherosclerosis. Pulmonary venous hypertension with mild interstitial edema. Small effusions and lower lobe atelectasis. IMPRESSION: Findings most consistent with congestive heart failure. Cannot rule out coexistent pneumonia in the lower lobes. Similar appearance compared to the study of 09/14. There may be slightly worse volume loss at the left base. Electronically Signed   By: Nelson Chimes M.D.   On: 11/02/2021 13:36   DG  Chest Port 1 View  Result Date: 10/27/2021 CLINICAL DATA:  Shortness of breath EXAM: PORTABLE CHEST 1 VIEW COMPARISON:  Radiographs 10/23/2021 FINDINGS: Stable cardiomegaly. Right basilar atelectasis/consolidation. Interstitial markings are prominent bilaterally likely due to low lung volumes and chronic interstitial lung disease. Question trace pleural effusions bilaterally are pleural thickening. No pneumothorax. Elevated right hemidiaphragm. No acute osseous abnormality. Aortic atherosclerotic calcification. IMPRESSION: Elevated right hemidiaphragm with associated presumed atelectasis though pneumonia is difficult to exclude. Hypoinflation and chronic interstitial prominence. Bilateral trace pleural effusions or pleural thickening. Cardiomegaly. Electronically Signed   By: Placido Sou M.D.   On: 10/27/2021 19:15   ECHOCARDIOGRAM LIMITED  Result Date: 10/23/2021    ECHOCARDIOGRAM LIMITED REPORT   Patient Name:   ERENDIRA CRABTREE Date of Exam: 10/23/2021 Medical Rec #:  [id number]       Height:       62.0 in Accession #:  [id number]      Weight:       174.8 lb Date of Birth:  03-28-1940       BSA:          1.805 m Patient Age:    53 years        BP:           124/73 mmHg Patient Gender: F               HR:           99 bpm. Exam Location:  [facility] Procedure: Limited Echo, Limited Color Doppler and Cardiac Doppler Indications:    CHF-Acute Diastolic M19.62  History:        Patient has prior history of Echocardiogram examinations, most                 recent 06/03/2021. CHF, Previous Myocardial Infarction, Stroke                 and COPD, Arrythmias:Atrial Fibrillation; Risk                 Factors:Hypertension, Diabetes and Dyslipidemia. Hx of ESRD on                 dialysis,.  Sonographer:    Alvino Chapel RCS Referring Phys: [phone] Naser Schuld IMPRESSIONS  1. Left ventricular ejection fraction, by estimation, is 40 to 45%. The left ventricle has mildly decreased function. The left ventricle demonstrates  global hypokinesis. There is moderate concentric left ventricular hypertrophy. Left ventricular diastolic function could not be evaluated. There is the interventricular septum is flattened in systole and diastole, consistent with right ventricular pressure and volume overload.  2. Right ventricular systolic function is moderately reduced. The right ventricular size is normal. A Prominent moderator band is visualized. There is moderately elevated pulmonary artery systolic pressure. The estimated right ventricular systolic pressure is 98.9 mmHg.  3. Left atrial size was severely dilated.  4. Right atrial size was severely dilated.  5. The mitral valve is degenerative. Moderate mitral annular calcification.  6. Tricuspid valve regurgitation is moderate.  7. The aortic valve is tricuspid. There is moderate calcification of the aortic valve. There is moderate thickening of the aortic valve. No aortic stenosis is present.  8. The inferior vena cava is dilated in size with <50% respiratory variability, suggesting right atrial pressure of 15 mmHg. FINDINGS  Left Ventricle: Left ventricular ejection fraction, by estimation, is 40 to 45%. The left ventricle has mildly decreased function. The left ventricle demonstrates global hypokinesis. The left ventricular internal cavity size was normal in size. There is  moderate concentric left ventricular hypertrophy. Abnormal (paradoxical) septal motion, consistent with left bundle branch block and the interventricular septum is flattened in systole and diastole, consistent with right ventricular pressure and volume overload. Left ventricular diastolic function could not be evaluated. Left ventricular diastolic function could not be evaluated due to atrial fibrillation. Right Ventricle: The right ventricular size is normal. No increase in right ventricular wall thickness. Right ventricular systolic function is moderately reduced. There is moderately elevated pulmonary artery systolic  pressure. The tricuspid regurgitant velocity is 3.26 m/s, and with an assumed right atrial pressure of 15 mmHg, the estimated right ventricular systolic pressure is 21.1 mmHg. Left Atrium: Left atrial size was severely dilated. Right Atrium: Right atrial size was severely dilated. Pericardium: There is no evidence of pericardial effusion. Mitral Valve: The mitral valve is degenerative in appearance. There is moderate thickening  of the mitral valve leaflet(s). There is moderate calcification of the mitral valve leaflet(s). Moderate mitral annular calcification. Tricuspid Valve: The tricuspid valve is normal in structure. Tricuspid valve regurgitation is moderate . No evidence of tricuspid stenosis. Aortic Valve: The aortic valve is tricuspid. There is moderate calcification of the aortic valve. There is moderate thickening of the aortic valve. No aortic stenosis is present. Pulmonic Valve: The pulmonic valve was normal in structure. Pulmonic valve regurgitation is not visualized. No evidence of pulmonic stenosis. Aorta: The aortic root is normal in size and structure. Venous: The inferior vena cava is dilated in size with less than 50% respiratory variability, suggesting right atrial pressure of 15 mmHg. IAS/Shunts: No atrial level shunt detected by color flow Doppler. Additional Comments: A Prominent moderator band is visualized. LEFT VENTRICLE PLAX 2D LVIDd:         4.10 cm LVIDs:         3.50 cm LV PW:         1.50 cm LV IVS:        1.40 cm LVOT diam:     1.80 cm LVOT Area:     2.54 cm  LV Volumes (MOD) LV vol d, MOD A2C: 78.4 ml LV vol d, MOD A4C: 47.9 ml LV vol s, MOD A2C: 40.6 ml LV vol s, MOD A4C: 27.3 ml LV SV MOD A2C:     37.8 ml LV SV MOD A4C:     47.9 ml LV SV MOD BP:      30.1 ml RIGHT VENTRICLE TAPSE (M-mode): 1.4 cm LEFT ATRIUM         Index LA diam:    4.50 cm 2.49 cm/m   AORTA Ao Root diam: 3.60 cm TRICUSPID VALVE TR Peak grad:   42.5 mmHg TR Vmax:        326.00 cm/s  SHUNTS Systemic Diam: 1.80 cm  Fransico Him MD Electronically signed by Fransico Him MD Signature Date/Time: 10/23/2021/2:12:30 PM    Final    DG CHEST PORT 1 VIEW  Result Date: 10/23/2021 CLINICAL DATA:  Respiratory distress. EXAM: PORTABLE CHEST 1 VIEW COMPARISON:  Chest x-rays dated 10/20/2021, 09/06/2021 and 09/05/2021. FINDINGS: Stable cardiomegaly. Lungs are stable. No confluence opacity to suggest a developing pneumonia. No evidence of overt alveolar pulmonary edema. Coarse lung markings bilaterally suggests chronic interstitial lung disease/fibrosis. There is also chronic elevation of the RIGHT hemidiaphragm. No acute-appearing osseous abnormality. IMPRESSION: 1. No active disease. No evidence of pneumonia or alveolar pulmonary edema. 2. Stable cardiomegaly. 3. Probable chronic interstitial lung disease/fibrosis. 4. Stable pleural thickening/fluid at the RIGHT lung fissure. Electronically Signed   By: Franki Cabot M.D.   On: 10/23/2021 11:13    Orson Eva, DO  [facility]  If 7PM-7AM, please contact night-coverage [email or web address] Password [email or web address] 11/19/2021, 4:59 PM   LOS: 2 days

## 2021-11-19 NOTE — Progress Notes (Signed)
Called [facility] about if patients dentures were there as they are not here at the hospital, and staff stated they were at the nursing facility in her room. Pt's family made aware.

## 2021-11-20 ENCOUNTER — Encounter (HOSPITAL_COMMUNITY): Payer: Self-pay | Admitting: Internal Medicine

## 2021-11-20 DIAGNOSIS — A419 Sepsis, unspecified organism: Secondary | ICD-10-CM | POA: Diagnosis not present

## 2021-11-20 DIAGNOSIS — E875 Hyperkalemia: Secondary | ICD-10-CM | POA: Insufficient documentation

## 2021-11-20 DIAGNOSIS — R6521 Severe sepsis with septic shock: Secondary | ICD-10-CM | POA: Diagnosis not present

## 2021-11-20 DIAGNOSIS — I4819 Other persistent atrial fibrillation: Secondary | ICD-10-CM | POA: Diagnosis not present

## 2021-11-20 LAB — CBC
HCT: 30.4 % — ABNORMAL LOW (ref 36.0–46.0)
Hemoglobin: 9.2 g/dL — ABNORMAL LOW (ref 12.0–15.0)
MCH: 32.2 pg (ref 26.0–34.0)
MCHC: 30.3 g/dL (ref 30.0–36.0)
MCV: 106.3 fL — ABNORMAL HIGH (ref 80.0–100.0)
Platelets: 86 10*3/uL — ABNORMAL LOW (ref 150–400)
RBC: 2.86 MIL/uL — ABNORMAL LOW (ref 3.87–5.11)
RDW: 17.2 % — ABNORMAL HIGH (ref 11.5–15.5)
WBC: 3 10*3/uL — ABNORMAL LOW (ref 4.0–10.5)
nRBC: 0 % (ref 0.0–0.2)

## 2021-11-20 LAB — BASIC METABOLIC PANEL
Anion gap: 15 (ref 5–15)
BUN: 61 mg/dL — ABNORMAL HIGH (ref 8–23)
CO2: 23 mmol/L (ref 22–32)
Calcium: 9.3 mg/dL (ref 8.9–10.3)
Chloride: 91 mmol/L — ABNORMAL LOW (ref 98–111)
Creatinine, Ser: 4.61 mg/dL — ABNORMAL HIGH (ref 0.44–1.00)
GFR, Estimated: 9 mL/min — ABNORMAL LOW (ref >60)
Glucose, Bld: 118 mg/dL — ABNORMAL HIGH (ref 70–99)
Potassium: 5.5 mmol/L — ABNORMAL HIGH (ref 3.5–5.1)
Sodium: 129 mmol/L — ABNORMAL LOW (ref 135–145)

## 2021-11-20 MED ORDER — PIPERACILLIN-TAZOBACTAM IN DEX 2-0.25 GM/50ML IV SOLN
2.2500 g | Freq: Three times a day (TID) | INTRAVENOUS | Status: DC
  Filled 2021-11-20 (×4): qty 50

## 2021-11-20 MED ORDER — PIPERACILLIN-TAZOBACTAM IN DEX 2-0.25 GM/50ML IV SOLN: 2.2500 g | Freq: Three times a day (TID) | INTRAVENOUS | Status: DC

## 2021-11-20 MED ORDER — IPRATROPIUM-ALBUTEROL 0.5-2.5 (3) MG/3ML IN SOLN
3.0000 mL | Freq: Three times a day (TID) | RESPIRATORY_TRACT | Status: DC
  Administered 2021-11-21 (×2): 3 mL via RESPIRATORY_TRACT
  Filled 2021-11-20 (×3): qty 3

## 2021-11-20 MED ORDER — APIXABAN 2.5 MG PO TABS
2.5000 mg | ORAL_TABLET | Freq: Two times a day (BID) | ORAL | Status: DC
  Administered 2021-11-20 – 2021-11-26 (×10): 2.5 mg via ORAL
  Filled 2021-11-20 (×14): qty 1

## 2021-11-20 MED ORDER — IPRATROPIUM-ALBUTEROL 0.5-2.5 (3) MG/3ML IN SOLN
3.0000 mL | Freq: Three times a day (TID) | RESPIRATORY_TRACT | Status: DC
  Administered 2021-11-20 (×2): 3 mL via RESPIRATORY_TRACT
  Filled 2021-11-20 (×2): qty 3

## 2021-11-20 MED ORDER — VANCOMYCIN HCL 1750 MG/350ML IV SOLN: 1750.0000 mg | Freq: Once | INTRAVENOUS | Status: DC

## 2021-11-20 MED ORDER — APIXABAN 2.5 MG PO TABS: 2.5000 mg | ORAL_TABLET | Freq: Two times a day (BID) | ORAL | Status: DC

## 2021-11-20 MED ORDER — VANCOMYCIN HCL 1500 MG/300ML IV SOLN
1500.0000 mg | Freq: Once | INTRAVENOUS | Status: AC
  Administered 2021-11-20: 1500 mg via INTRAVENOUS
  Filled 2021-11-20: qty 300

## 2021-11-20 MED ORDER — MIDODRINE HCL 5 MG PO TABS: 10.0000 mg | ORAL_TABLET | Freq: Three times a day (TID) | ORAL | Status: DC

## 2021-11-20 MED ORDER — VANCOMYCIN HCL IN DEXTROSE 1-5 GM/200ML-% IV SOLN: 1000.0000 mg | INTRAVENOUS | Status: DC

## 2021-11-20 MED ORDER — MIDODRINE HCL 5 MG PO TABS
10.0000 mg | ORAL_TABLET | Freq: Three times a day (TID) | ORAL | Status: DC
  Administered 2021-11-20 – 2021-11-26 (×17): 10 mg via ORAL
  Filled 2021-11-20 (×19): qty 2

## 2021-11-20 MED ORDER — SODIUM ZIRCONIUM CYCLOSILICATE 10 G PO PACK
10.0000 g | PACK | Freq: Once | ORAL | Status: AC
  Administered 2021-11-20: 10 g via ORAL
  Filled 2021-11-20: qty 1

## 2021-11-20 MED ORDER — SODIUM CHLORIDE 0.9 % IV SOLN
2.2500 g | Freq: Three times a day (TID) | INTRAVENOUS | Status: DC
  Administered 2021-11-20 – 2021-11-26 (×19): 2.25 g via INTRAVENOUS
  Filled 2021-11-20 (×17): qty 10
  Filled 2021-11-20: qty 2.25
  Filled 2021-11-20 (×4): qty 10

## 2021-11-20 NOTE — Progress Notes (Signed)
ANTICOAGULATION CONSULT NOTE - Follow up [facility] for Lovenox (holding Apixaban) Indication: atrial fibrillation  Allergies  Allergen Reactions   Codeine Nausea And Vomiting   Patient Measurements: Height: '5\' 7"'$  (170.2 cm) Weight: 86.8 kg (191 lb 5.8 oz) IBW/kg (Calculated) : 61.6  Vital Signs: Temp: 97.5 F (36.4 C) (10/08 0349) Temp Source: Oral (10/08 0349) BP: 138/76 (10/08 0715) Pulse Rate: 109 (10/08 0715)  Labs: Recent Labs    12/07/2021 1115 11/18/21 0729 11/19/21 0415  HGB 10.3* 10.3* 9.9*  HCT 36.0 35.5* 33.7*  PLT 87* 130* 107*  LABPROT 16.1*  --   --   INR 1.3*  --   --   CREATININE 3.30* 3.65* 3.89*  TROPONINIHS 45*  --   --      Estimated Creatinine Clearance: 12.4 mL/min (A) (by C-G formula based on SCr of 3.89 mg/dL (H)).   Medical History: Past Medical History:  Diagnosis Date   Anemia in chronic kidney disease (CODE) 06/05/2015   Arthritis    "bad in my legs" (12/07/2014)   Chronic atrial fibrillation ([place]) 06/05/2015   Chronic back pain    "dr said my spine is crooked"   Chronic bronchitis ([place])    "get it q yr" (46/27/0350)   Complication of anesthesia    headache for 2 days after surgery in May 2019   COPD (chronic obstructive pulmonary disease) ([place])    ESRD on hemodialysis ([place]) 06/05/2015   Gait difficulty 09/02/2014   Hypercholesterolemia    Hypertension    Neuropathy 2015   in legs   NSVT (nonsustained ventricular tachycardia) ([place])    Sinus brady-tachy syndrome ([place])    Stroke ([place]) 05/2014   denies residual on 12/07/2014   Type II diabetes mellitus (HCC)     Assessment: 81 y/o F on apixaban PTA for afib. Holding apixaban and using Lovenox. Last dose of apixaban was 10/5. Noted renal dysfunction requiring Lovenox frequency adjustment. Somewhat chronic thrombocytopenia.   Goal of Therapy:  Monitor platelets by anticoagulation protocol: Yes   Plan:  Lovenox 1 mg/kg subcutaneous q24h Daily CBC Monitor  for bleeding  Thomasenia Sales, PharmD, [facility] Clinical Pharmacist

## 2021-11-20 NOTE — Progress Notes (Addendum)
PROGRESS NOTE  Jody [name] QVZ:[id number] DOB: [birth year] DOA: 12/01/2021 PCP: Monico Blitz, MD  Brief History:  81 year old female with history of stroke, COPD, chronic respiratory failure on 3 L, systolic and diastolic CHF, persistent atrial fibrillation, ESRD, iron deficiency presenting from her nursing facility [facility]) from which she is a permanent resident secondary to altered mental status and hypotension.  Patient was at HD the morning of admission.  After 25 min of HD, patient was hypotensive and somnolent.  Pt unable to provide history due to acute encephalopathy.   At baseline, patient is conversant although pleasantly confused occasionally.  She is bedbound at baseline.  Apparently, the patient was was her usual self prior to going to dialysis on the morning of 11/20/2021. The patient was most recently admitted to the hospital from 10/27/2021 to 11/03/21 for acute on chronic respiratory failure which was thought to be multifactorial secondary to pneumonia, atrial fibrillation with RVR, fluid overload.  The patient was discharged back to her facility with 3 more days of cefdinir and doxycycline. In the ED, the patient was hypothermic with a temperature of 93.7.  She was hypotensive initially with blood pressure 57/43.  The patient was placed on nonrebreather with saturation 100%.  Chest x-ray showed bilateral perihilar opacities.  ABG showed seven-point 1/96/190/29 on 100%.  The patient was started on Levophed 5 mcg/kg/min.  Vancomycin and Zosyn were started.  EKG showed atrial fibrillation with right bundle branch block.  I had a goals of care discussion with the patient's niece who is her POA.  She confirms the patient is DNR status.  she wants to continue aggressive care at least for the next 24 hours.  I requested Dr. Sabra Heck the place central line.  BiPAP was ordered.  11/18/21--patient is obtunded, minimally responsive to protopathic stimuli--occasional withdraw.  Levophed  up 5 mcg>>55mg.  Remains on BiPAP with no improvement on ABG Discussed GOC with niece.  Niece felt patient was awake and conversant and recognized her last night.  She states pt was able to communicate her needs.  This was not corroborated by nursing staff.  Niece wants to continue care and will visit patient this evening to confirm my findings of clinical worsening.  Nephrology consulted.  PCCM following.  Palliative saw patient.  11/19/21--discussed with Dr. SHalford Chessmanand Ms. Dalton on 10/6 and Ms.Dalton again on 10/7.  Agreed to take pt off BiPAP with no plan to resume it --patient is confused, waxing and waning out of consciousness. Remains on levophed --discussed with Ms. Dalton that patient is gradually and continuing declining medically and continuing medications with curative intent (antibiotics) and vasopressor will prolong patient's suffering.  Ms DKirk Ruthsagreed that pt is suffering and did not want to prolong the process.  She wanted patient to be comfortable and allow "the Lord to take her when it's time".  She agreed for antibiotic discontinuation.   11/20/21--patient awake and alert and conversant.  Stable on 3L [place].  Discussed with POA [name], who request patient be placed back on dialysis.  Now that patient is "stable", [name] requested blood work be restarted and patient be restarted on appropriate medications.  Discussed case with nephrology, Dr. Schertz>>no immediate need for HD presently, but will reconsult in am.  Loklema ordered   Assessment/Plan: Septic shock -likely due to  pneumonia -presented with hypothermia, hypotension, altered mental status -lactic acid1.0 -PCT0.74>>0.80 -restart empiric vanc/zosyn (vanc dosed on 10/5) -follow blood culture neg -levophed continues  per family request -SBP goes to mid 80s  off levophed -restart midodrine per POA request   Acute on chronic respiratory failure with hypoxia and hypercapnia (HCC) Personally reviewed CXR--increased interstitial  markings; bilateral perihilar opacities remain off BiPAP per patient and family request Chronic on 3-4 L 10/5 ABG 7.1/96/190/29 (1.0) 10/6 ABG 7.15/85/95/29 (0.4) Appreciate PCCM   Lobar Pneumonia -discontinue on zosyn, vanc and we are transitioning to a focus of comfort measures -11/20/21  POA [name] wanted to restart medications   Acute metabolic Encephalopathy -patient is obtunded, less responsive than 11/16/2021 -occasional withdraw to protopathic stimuli initially -10/8-- more awake and conversant   ESRD on dialysis [facility]) Schedule--Tuesday Thursday Saturday.  -last full HD session 10/3  Reports compliance with her HD sessions. -discussed with nephrology initially, Dr. [place] Bali is not dialysis or CRRT candidate -10/8  POA wanted to restart HD.  Discussed with Dr. Glory Rosebush immediate need for HD presently, will reconsult in am   Acute on chronic combined systolic and diastolic CHF (congestive heart failure) ([place]) - Manage volume with HD -06/03/21 Echo EF 50%, RV overload, severe TR -08/02/20 Echo EF 45-50% HK inferoseptal, inferolateral, Inf wall; mod TR --10/24/21 Echo-EF 40-45%, global HK, RV overload, PASP 57.5 --clinically fluid overloaded presently --hold metoprolol succinate due to hypotension   Dysphagia -evaluated by speech last admission -continue dys 2 diet with thin liquids last admission -restart diet now that patient is more awake   Persistent atrial fibrillation ([place]) On anticoagulation with Eliquis. Rate Controlled, heart rate 50s to 90s.  Not on rate limiting agents. -transitioned to [facility] lovenox - Resume Eliquis when able to tolerate po -now transitioning to more comfort focused approach   Essential hypertension Previously on metoprolol>>dose was decreased to 12.5 mg daily Off metoprolol due to hypotension   Thrombocytopenia (HCC) Fairly chronic dating back to Feb 2023 -B12--1435 -folate-7.0 -TSH--1.132 -acute worsening due to sepsis   PAD  (peripheral artery disease) (HCC) - Continue statin and ASA when able to tolerate po     Goals of care, counseling/discussion Discussed with the patient's POA, [name] last admission 10/23/21 Discussed GOC again with [name] 10/5>>confirmed DNR -- I discussed the patient's overall poor prognosis given her frailty, multiple comorbid conditions, numerous hospitalizations and poor functional baseline -10/6 discussed again with niece [name]--updated her that patient is clinically worse than 10/5>>she wishes to continue full care for now - 10/7 discussed with Ms. Dalton--see above -11/20/21--patient awake and alert and conversant.  Stable on 3L.  Discussed with POA [name], who request patient be placed back on dialysis.  Now that patient is "stable", [name] requested blood work be restarted and patient be restarted on appropriate medications.       Family Communication:   Niece  updated 10/8   Consultants:  renal, PCCM, palliative   Code Status:  DNR   DVT Prophylaxis:  [place] Lovenox     Procedures: As Listed in Progress Note Above   Antibiotics: Vanc 10/5>.10/7---restart 10/8>> Zosy 10/5>>10/7--restart 10/8>>       Subjective: Patient denies fevers, chills, headache, chest pain, dyspnea, nausea, vomiting, diarrhea, abdominal pain   Objective: Vitals:   11/20/21 1315 11/20/21 1403 11/20/21 1511 11/20/21 1545  BP: (!) 86/61 97/64  (!) 114/58  Pulse:      Resp:  14    Temp:      TempSrc:      SpO2:  97% 91% 94%  Weight:      Height:  Intake/Output Summary (Last 24 hours) at 11/20/2021 1547 Last data filed at 11/20/2021 1546 Gross per 24 hour  Intake 417.93 ml  Output --  Net 417.93 ml   Weight change: 0 kg Exam:  General:  Pt is alert, follows commands appropriately, not in acute distress HEENT: No icterus, No thrush, No neck mass, [place]/AT Cardiovascular: IRRR, S1/S2, no rubs, no gallops Respiratory: bibasilar crackles. No wheeze Abdomen: Soft/+BS,  non tender, non distended, no guarding Extremities: 2 + LE edema, No lymphangitis, No petechiae, No rashes, no synovitis   Data Reviewed: I have personally reviewed following labs and imaging studies Basic Metabolic Panel: Recent Labs  Lab 11/28/2021 1115 11/18/21 0729 11/19/21 0415 11/20/21 1328  NA 132* 132* 132* 129*  K 4.5 5.0 5.1 5.5*  CL 94* 93* 92* 91*  CO2 '29 28 26 23  '$ GLUCOSE 170* 167* 124* 118*  BUN 44* 48* 54* 61*  CREATININE 3.30* 3.65* 3.89* 4.61*  CALCIUM 8.9 9.2 9.3 9.3   Liver Function Tests: Recent Labs  Lab 11/21/2021 1115 11/18/21 0729 11/19/21 0415  AST '20 20 17  '$ ALT '19 21 19  '$ ALKPHOS 148* 130* 106  BILITOT 1.1 1.4* 1.7*  PROT 5.9* 6.1* 5.9*  ALBUMIN 3.2* 3.4* 3.3*   No results for input(s): "LIPASE", "AMYLASE" in the last 168 hours. No results for input(s): "AMMONIA" in the last 168 hours. Coagulation Profile: Recent Labs  Lab 12/12/2021 1115  INR 1.3*   CBC: Recent Labs  Lab 11/25/2021 1115 11/18/21 0729 11/19/21 0415 11/20/21 1328  WBC 3.9* 7.4 4.5 3.0*  NEUTROABS 3.0  --   --   --   HGB 10.3* 10.3* 9.9* 9.2*  HCT 36.0 35.5* 33.7* 30.4*  MCV 112.9* 110.9* 109.8* 106.3*  PLT 87* 130* 107* 86*   Cardiac Enzymes: No results for input(s): "CKTOTAL", "CKMB", "CKMBINDEX", "TROPONINI" in the last 168 hours. BNP: Invalid input(s): "POCBNP" CBG: Recent Labs  Lab 12/01/2021 1135  GLUCAP 162*   HbA1C: No results for input(s): "HGBA1C" in the last 72 hours. Urine analysis:    Component Value Date/Time   COLORURINE YELLOW 05/27/2014 1902   APPEARANCEUR CLEAR 05/27/2014 1902   LABSPEC 1.012 05/27/2014 1902   PHURINE 5.0 05/27/2014 1902   GLUCOSEU NEGATIVE 05/27/2014 1902   HGBUR NEGATIVE 05/27/2014 1902   BILIRUBINUR NEGATIVE 05/27/2014 1902   KETONESUR NEGATIVE 05/27/2014 1902   PROTEINUR 100 (A) 05/27/2014 1902   UROBILINOGEN 0.2 05/27/2014 1902   NITRITE NEGATIVE 05/27/2014 1902   LEUKOCYTESUR NEGATIVE 05/27/2014 1902   Sepsis  Labs: '@LABRCNTIP'$ (procalcitonin:4,lacticidven:4) ) Recent Results (from the past 240 hour(s))  Culture, blood (Routine X 2) w Reflex to ID Panel     Status: None (Preliminary result)   Collection Time: 11/22/2021  3:58 PM   Specimen: BLOOD RIGHT WRIST  Result Value Ref Range Status   Specimen Description BLOOD RIGHT WRIST  Final   Special Requests   Final    BOTTLES DRAWN AEROBIC AND ANAEROBIC Blood Culture adequate volume   Culture   Final    NO GROWTH 3 DAYS Performed at [facility], [address], [place], [place] [address]    Report Status PENDING  Incomplete  Culture, blood (Routine X 2) w Reflex to ID Panel     Status: None (Preliminary result)   Collection Time: 11/27/2021  4:14 PM   Specimen: BLOOD RIGHT HAND  Result Value Ref Range Status   Specimen Description BLOOD RIGHT HAND  Final   Special Requests   Final  BOTTLES DRAWN AEROBIC AND ANAEROBIC Blood Culture adequate volume   Culture   Final    NO GROWTH 3 DAYS Performed at [facility], [address] [place], [place] [address]    Report Status PENDING  Incomplete     Scheduled Meds:  apixaban  2.5 mg Oral BID   Chlorhexidine Gluconate Cloth  6 each Topical Daily   ipratropium-albuterol  3 mL Nebulization Q8H   midodrine  10 mg Oral TID WC   mouth rinse  15 mL Mouth Rinse 4 times per day   Continuous Infusions:  norepinephrine (LEVOPHED) Adult infusion Stopped (11/20/21 0857)   piperacillin-tazobactam (ZOSYN) 2.25 g in sodium chloride 0.9 % 50 mL IVPB Stopped (11/20/21 1453)   [START ON 11/22/2021] vancomycin     vancomycin 150 mL/hr at 11/20/21 1546    Procedures/Studies: CT HEAD WO CONTRAST  Result Date: 11/14/2021 CLINICAL DATA:  An 81 year old female presents with mental status changes of unknown cause. EXAM: CT HEAD WITHOUT CONTRAST TECHNIQUE: Contiguous axial images were obtained from the base of the skull through the vertex without intravenous contrast. RADIATION DOSE REDUCTION: This exam was performed  according to the departmental dose-optimization program which includes automated exposure control, adjustment of the mA and/or kV according to patient size and/or use of iterative reconstruction technique. COMPARISON:  June 04, 2021. FINDINGS: Brain: No evidence of acute infarction, hemorrhage, hydrocephalus, extra-axial collection or mass lesion/mass effect. Signs of prior RIGHT parietal-occipital infarct with encephalomalacia. Signs of cerebral atrophy. Vascular: No hyperdense vessel or unexpected calcification. Skull: Normal. Negative for fracture or focal lesion. LEFT mastoid and middle ear effusion. All mastoid air cells are opacified. No stranding adjacent to mastoid tip per signs of bony destruction. Signs of renal osteodystrophy throughout the calvarium Sinuses/Orbits: No acute finding. Other: None IMPRESSION: 1. No acute intracranial abnormality. 2. Signs of atrophy, chronic microvascular ischemic change in prior infarct with encephalomalacia. 3. LEFT mastoid and middle ear effusion. No stranding adjacent to mastoid tip or signs of bony destruction. Correlate with any signs of otitis media and any clinical signs to suggest early mastoiditis. 4. Signs of renal osteodystrophy throughout the calvarium. Electronically Signed   By: Zetta Bills M.D.   On: 11/21/2021 15:01   DG Chest Port 1 View  Result Date: 11/24/2021 CLINICAL DATA:  Provided history: Altered mental status. EXAM: PORTABLE CHEST 1 VIEW COMPARISON:  Prior chest radiographs 11/05/2021. FINDINGS: Shows for age radiograph. Cardiomegaly. Opacities within the perihilar lungs bilaterally, and left lung base. Mild atelectasis within the right lung base. Suspected trace left pleural effusion. No evidence of pneumothorax. Degenerative changes of the spine. IMPRESSION: 1. Shallow inspiration radiograph. 2. Opacities within the perihilar lungs bilaterally, and left lung base. Findings may reflect atelectasis and/or pneumonia. 3. Minimal atelectasis  within the right lung base. 4. Cardiomegaly. 5. Aortic Atherosclerosis (ICD10-I70.0). Electronically Signed   By: Kellie Simmering D.O.   On: 12/08/2021 12:14   DG Chest Portable 1 View  Result Date: 11/05/2021 CLINICAL DATA:  Hypoxia EXAM: PORTABLE CHEST 1 VIEW COMPARISON:  11/02/2021 chest radiograph. FINDINGS: Low lung volumes. Stable cardiomediastinal silhouette with mild cardiomegaly. No pneumothorax. No significant pleural effusions. No overt pulmonary edema. Patchy bibasilar lung opacities, similar. IMPRESSION: 1. Stable cardiomegaly without overt pulmonary edema. 2. Low lung volumes with patchy bibasilar lung opacities, similar, favor atelectasis, with component of aspiration or pneumonia not excluded. Electronically Signed   By: Ilona Sorrel M.D.   On: 11/05/2021 17:53   DG CHEST PORT 1 VIEW  Result Date:  11/02/2021 CLINICAL DATA:  Respiratory distress EXAM: PORTABLE CHEST 1 VIEW COMPARISON:  10/27/2021 FINDINGS: Poor inspiration. Cardiomegaly and aortic atherosclerosis. Pulmonary venous hypertension with mild interstitial edema. Small effusions and lower lobe atelectasis. IMPRESSION: Findings most consistent with congestive heart failure. Cannot rule out coexistent pneumonia in the lower lobes. Similar appearance compared to the study of 09/14. There may be slightly worse volume loss at the left base. Electronically Signed   By: Nelson Chimes M.D.   On: 11/02/2021 13:36   DG Chest Port 1 View  Result Date: 10/27/2021 CLINICAL DATA:  Shortness of breath EXAM: PORTABLE CHEST 1 VIEW COMPARISON:  Radiographs 10/23/2021 FINDINGS: Stable cardiomegaly. Right basilar atelectasis/consolidation. Interstitial markings are prominent bilaterally likely due to low lung volumes and chronic interstitial lung disease. Question trace pleural effusions bilaterally are pleural thickening. No pneumothorax. Elevated right hemidiaphragm. No acute osseous abnormality. Aortic atherosclerotic calcification. IMPRESSION:  Elevated right hemidiaphragm with associated presumed atelectasis though pneumonia is difficult to exclude. Hypoinflation and chronic interstitial prominence. Bilateral trace pleural effusions or pleural thickening. Cardiomegaly. Electronically Signed   By: Placido Sou M.D.   On: 10/27/2021 19:15   ECHOCARDIOGRAM LIMITED  Result Date: 10/23/2021    ECHOCARDIOGRAM LIMITED REPORT   Patient Name:   RITTA HAMMES Date of Exam: 10/23/2021 Medical Rec #:  [id number]       Height:       62.0 in Accession #:    [id number]      Weight:       174.8 lb Date of Birth:  Jun 28, 1940       BSA:          1.805 m Patient Age:    66 years        BP:           124/73 mmHg Patient Gender: F               HR:           99 bpm. Exam Location:  [facility] Procedure: Limited Echo, Limited Color Doppler and Cardiac Doppler Indications:    CHF-Acute Diastolic J69.67  History:        Patient has prior history of Echocardiogram examinations, most                 recent 06/03/2021. CHF, Previous Myocardial Infarction, Stroke                 and COPD, Arrythmias:Atrial Fibrillation; Risk                 Factors:Hypertension, Diabetes and Dyslipidemia. Hx of ESRD on                 dialysis,.  Sonographer:    Alvino Chapel RCS Referring Phys: [phone] Marbella Markgraf IMPRESSIONS  1. Left ventricular ejection fraction, by estimation, is 40 to 45%. The left ventricle has mildly decreased function. The left ventricle demonstrates global hypokinesis. There is moderate concentric left ventricular hypertrophy. Left ventricular diastolic function could not be evaluated. There is the interventricular septum is flattened in systole and diastole, consistent with right ventricular pressure and volume overload.  2. Right ventricular systolic function is moderately reduced. The right ventricular size is normal. A Prominent moderator band is visualized. There is moderately elevated pulmonary artery systolic pressure. The estimated right ventricular systolic  pressure is 10.1 mmHg.  3. Left atrial size was severely dilated.  4. Right atrial size was severely dilated.  5. The mitral valve is degenerative. Moderate mitral  annular calcification.  6. Tricuspid valve regurgitation is moderate.  7. The aortic valve is tricuspid. There is moderate calcification of the aortic valve. There is moderate thickening of the aortic valve. No aortic stenosis is present.  8. The inferior vena cava is dilated in size with <50% respiratory variability, suggesting right atrial pressure of 15 mmHg. FINDINGS  Left Ventricle: Left ventricular ejection fraction, by estimation, is 40 to 45%. The left ventricle has mildly decreased function. The left ventricle demonstrates global hypokinesis. The left ventricular internal cavity size was normal in size. There is  moderate concentric left ventricular hypertrophy. Abnormal (paradoxical) septal motion, consistent with left bundle branch block and the interventricular septum is flattened in systole and diastole, consistent with right ventricular pressure and volume overload. Left ventricular diastolic function could not be evaluated. Left ventricular diastolic function could not be evaluated due to atrial fibrillation. Right Ventricle: The right ventricular size is normal. No increase in right ventricular wall thickness. Right ventricular systolic function is moderately reduced. There is moderately elevated pulmonary artery systolic pressure. The tricuspid regurgitant velocity is 3.26 m/s, and with an assumed right atrial pressure of 15 mmHg, the estimated right ventricular systolic pressure is 16.1 mmHg. Left Atrium: Left atrial size was severely dilated. Right Atrium: Right atrial size was severely dilated. Pericardium: There is no evidence of pericardial effusion. Mitral Valve: The mitral valve is degenerative in appearance. There is moderate thickening of the mitral valve leaflet(s). There is moderate calcification of the mitral valve leaflet(s).  Moderate mitral annular calcification. Tricuspid Valve: The tricuspid valve is normal in structure. Tricuspid valve regurgitation is moderate . No evidence of tricuspid stenosis. Aortic Valve: The aortic valve is tricuspid. There is moderate calcification of the aortic valve. There is moderate thickening of the aortic valve. No aortic stenosis is present. Pulmonic Valve: The pulmonic valve was normal in structure. Pulmonic valve regurgitation is not visualized. No evidence of pulmonic stenosis. Aorta: The aortic root is normal in size and structure. Venous: The inferior vena cava is dilated in size with less than 50% respiratory variability, suggesting right atrial pressure of 15 mmHg. IAS/Shunts: No atrial level shunt detected by color flow Doppler. Additional Comments: A Prominent moderator band is visualized. LEFT VENTRICLE PLAX 2D LVIDd:         4.10 cm LVIDs:         3.50 cm LV PW:         1.50 cm LV IVS:        1.40 cm LVOT diam:     1.80 cm LVOT Area:     2.54 cm  LV Volumes (MOD) LV vol d, MOD A2C: 78.4 ml LV vol d, MOD A4C: 47.9 ml LV vol s, MOD A2C: 40.6 ml LV vol s, MOD A4C: 27.3 ml LV SV MOD A2C:     37.8 ml LV SV MOD A4C:     47.9 ml LV SV MOD BP:      30.1 ml RIGHT VENTRICLE TAPSE (M-mode): 1.4 cm LEFT ATRIUM         Index LA diam:    4.50 cm 2.49 cm/m   AORTA Ao Root diam: 3.60 cm TRICUSPID VALVE TR Peak grad:   42.5 mmHg TR Vmax:        326.00 cm/s  SHUNTS Systemic Diam: 1.80 cm Fransico Him MD Electronically signed by Fransico Him MD Signature Date/Time: 10/23/2021/2:12:30 PM    Final    DG CHEST PORT 1 VIEW  Result Date: 10/23/2021 CLINICAL DATA:  Respiratory distress. EXAM: PORTABLE CHEST 1 VIEW COMPARISON:  Chest x-rays dated 10/20/2021, 09/06/2021 and 09/05/2021. FINDINGS: Stable cardiomegaly. Lungs are stable. No confluence opacity to suggest a developing pneumonia. No evidence of overt alveolar pulmonary edema. Coarse lung markings bilaterally suggests chronic interstitial lung  disease/fibrosis. There is also chronic elevation of the RIGHT hemidiaphragm. No acute-appearing osseous abnormality. IMPRESSION: 1. No active disease. No evidence of pneumonia or alveolar pulmonary edema. 2. Stable cardiomegaly. 3. Probable chronic interstitial lung disease/fibrosis. 4. Stable pleural thickening/fluid at the RIGHT lung fissure. Electronically Signed   By: Franki Cabot M.D.   On: 10/23/2021 11:13    Orson Eva, DO  [facility]  If 7PM-7AM, please contact night-coverage [email or web address] Password [email or web address] 11/20/2021, 3:47 PM   LOS: 3 days

## 2021-11-20 NOTE — Progress Notes (Signed)
Patient reluctant to take midodrine pills. After patient had medication in her mouth, she spit it out. Dr. Carles Collet made aware, okay to restart levophed gtt as needed for hypotension.

## 2021-11-20 NOTE — Progress Notes (Signed)
Pharmacy Antibiotic Note  Jody [name] a 81 y.o. female admitted on 11/20/2021 with sepsis.  Pharmacy has been consulted for vancomycin dosing.  Plan: Vancomycin '1000mg'$  IV every Tuesday Thursday and Saturday during HD.   Goal trough 15-20 mcg/mL.  Medical History: Past Medical History:  Diagnosis Date   Anemia in chronic kidney disease (CODE) 06/05/2015   Arthritis    "bad in my legs" (12/07/2014)   Chronic atrial fibrillation ([place]) 06/05/2015   Chronic back pain    "dr said my spine is crooked"   Chronic bronchitis ([place])    "get it q yr" (94/49/6759)   Complication of anesthesia    headache for 2 days after surgery in May 2019   COPD (chronic obstructive pulmonary disease) ([place])    ESRD on hemodialysis ([place]) 06/05/2015   Gait difficulty 09/02/2014   Hypercholesterolemia    Hypertension    Neuropathy 2015   in legs   NSVT (nonsustained ventricular tachycardia) ([place])    Sinus brady-tachy syndrome ([place])    Stroke ([place]) 05/2014   denies residual on 12/07/2014   Type II diabetes mellitus ([place])     Allergies:  Allergies  Allergen Reactions   Codeine Nausea And Vomiting    Filed Weights   11/18/21 0408 11/19/21 0400 11/20/21 0349  Weight: 87.9 kg (193 lb 12.6 oz) 86.8 kg (191 lb 5.8 oz) 86.8 kg (191 lb 5.8 oz)       Latest Ref Rng & Units 11/20/2021    1:28 PM 11/19/2021    4:15 AM 11/18/2021    7:29 AM  CBC  WBC 4.0 - 10.5 K/uL 3.0  4.5  7.4   Hemoglobin 12.0 - 15.0 g/dL 9.2  9.9  10.3   Hematocrit 36.0 - 46.0 % 30.4  33.7  35.5   Platelets 150 - 400 K/uL 86  107  130      Estimated Creatinine Clearance: 12.4 mL/min (A) (by C-G formula based on SCr of 3.89 mg/dL (H)).  Antibiotics Given (last 72 hours)     Date/Time Action Medication Dose Rate   11/21/2021 1418 New Bag/Given   vancomycin (VANCOCIN) IVPB 1000 mg/200 mL premix 1,000 mg 200 mL/hr   11/18/2021 2212 New Bag/Given   vancomycin (VANCOCIN) IVPB 1000 mg/200 mL premix 1,000 mg 200 mL/hr   11/18/21 0125  New Bag/Given   piperacillin-tazobactam (ZOSYN) IVPB 2.25 g 2.25 g 100 mL/hr   11/18/21 1114 New Bag/Given   piperacillin-tazobactam (ZOSYN) IVPB 2.25 g 2.25 g 100 mL/hr   11/18/21 1710 New Bag/Given   piperacillin-tazobactam (ZOSYN) 2.25 g in sodium chloride 0.9 % 50 mL IVPB 2.25 g 100 mL/hr   11/19/21 0125 New Bag/Given   piperacillin-tazobactam (ZOSYN) 2.25 g in sodium chloride 0.9 % 50 mL IVPB 2.25 g 100 mL/hr   11/19/21 0805 New Bag/Given   piperacillin-tazobactam (ZOSYN) 2.25 g in sodium chloride 0.9 % 50 mL IVPB 2.25 g 100 mL/hr       Antimicrobials this admission:  vancomycin 10/5 x 1, restarted 11/20/2021  >>  Zosyn 10/5>10/7  Microbiology results: 11/30/2021  BCx: sent  Thank you for allowing pharmacy to be a part of this patient's care.  Thomasenia Sales, PharmD Clinical Pharmacist

## 2021-11-20 NOTE — Progress Notes (Signed)
Lokelma packet mixed in water for patient to drink. Patient refused. Will keep at bedside in case patient changes her mind. Strawberry ice cream offered to patient. Patient refused.

## 2021-11-20 NOTE — Progress Notes (Signed)
Patient's family member, Olivia Mackie, at bedside feeding patient. Per Olivia Mackie, patient was able to eat bites of mashed potatoes with some chicken broth. About 10% of meal. Patient agreeable to take midodrine and drink some of the Lokelma from earlier.

## 2021-11-21 DIAGNOSIS — I4819 Other persistent atrial fibrillation: Secondary | ICD-10-CM | POA: Diagnosis not present

## 2021-11-21 DIAGNOSIS — R6521 Severe sepsis with septic shock: Secondary | ICD-10-CM | POA: Diagnosis not present

## 2021-11-21 DIAGNOSIS — A419 Sepsis, unspecified organism: Secondary | ICD-10-CM | POA: Diagnosis not present

## 2021-11-21 LAB — CBC
HCT: 28.6 % — ABNORMAL LOW (ref 36.0–46.0)
Hemoglobin: 8.8 g/dL — ABNORMAL LOW (ref 12.0–15.0)
MCH: 32 pg (ref 26.0–34.0)
MCHC: 30.8 g/dL (ref 30.0–36.0)
MCV: 104 fL — ABNORMAL HIGH (ref 80.0–100.0)
Platelets: 127 10*3/uL — ABNORMAL LOW (ref 150–400)
RBC: 2.75 MIL/uL — ABNORMAL LOW (ref 3.87–5.11)
RDW: 17.3 % — ABNORMAL HIGH (ref 11.5–15.5)
WBC: 4.3 10*3/uL (ref 4.0–10.5)
nRBC: 0.5 % — ABNORMAL HIGH (ref 0.0–0.2)

## 2021-11-21 LAB — RENAL FUNCTION PANEL
Albumin: 3.8 g/dL (ref 3.5–5.0)
Anion gap: 17 — ABNORMAL HIGH (ref 5–15)
BUN: 71 mg/dL — ABNORMAL HIGH (ref 8–23)
CO2: 18 mmol/L — ABNORMAL LOW (ref 22–32)
Calcium: 9 mg/dL (ref 8.9–10.3)
Chloride: 91 mmol/L — ABNORMAL LOW (ref 98–111)
Creatinine, Ser: 5.03 mg/dL — ABNORMAL HIGH (ref 0.44–1.00)
GFR, Estimated: 8 mL/min — ABNORMAL LOW (ref >60)
Glucose, Bld: 74 mg/dL (ref 70–99)
Phosphorus: 7.3 mg/dL — ABNORMAL HIGH (ref 2.5–4.6)
Potassium: 5.3 mmol/L — ABNORMAL HIGH (ref 3.5–5.1)
Sodium: 126 mmol/L — ABNORMAL LOW (ref 135–145)

## 2021-11-21 LAB — HEPATITIS C ANTIBODY: HCV Ab: NONREACTIVE

## 2021-11-21 LAB — HEPATITIS B CORE ANTIBODY, TOTAL: Hep B Core Total Ab: NONREACTIVE

## 2021-11-21 LAB — HEPATITIS B SURFACE ANTIGEN: Hepatitis B Surface Ag: NONREACTIVE

## 2021-11-21 LAB — HEPATITIS B SURFACE ANTIBODY,QUALITATIVE: Hep B S Ab: REACTIVE — AB

## 2021-11-21 MED ORDER — VANCOMYCIN HCL IN DEXTROSE 1-5 GM/200ML-% IV SOLN
1000.0000 mg | INTRAVENOUS | Status: DC
  Administered 2021-11-21: 1000 mg via INTRAVENOUS
  Filled 2021-11-21: qty 200

## 2021-11-21 MED ORDER — DARBEPOETIN ALFA 100 MCG/0.5ML IJ SOSY
100.0000 ug | PREFILLED_SYRINGE | INTRAMUSCULAR | Status: DC
  Filled 2021-11-21 (×2): qty 0.5

## 2021-11-21 MED ORDER — IPRATROPIUM-ALBUTEROL 0.5-2.5 (3) MG/3ML IN SOLN
3.0000 mL | RESPIRATORY_TRACT | Status: DC | PRN
  Administered 2021-11-23: 3 mL via RESPIRATORY_TRACT
  Filled 2021-11-21: qty 3

## 2021-11-21 MED ORDER — PENTAFLUOROPROP-TETRAFLUOROETH EX AERO: 1.0000 | INHALATION_SPRAY | CUTANEOUS | Status: DC | PRN

## 2021-11-21 MED ORDER — LIDOCAINE HCL (PF) 1 % IJ SOLN: 5.0000 mL | INTRAMUSCULAR | Status: DC | PRN

## 2021-11-21 MED ORDER — CINACALCET HCL 30 MG PO TABS: 30.0000 mg | ORAL_TABLET | ORAL | Status: DC

## 2021-11-21 MED ORDER — CALCITRIOL 0.25 MCG PO CAPS: 0.5000 ug | ORAL_CAPSULE | ORAL | Status: DC

## 2021-11-21 MED ORDER — LIDOCAINE-PRILOCAINE 2.5-2.5 % EX CREA: 1.0000 | TOPICAL_CREAM | CUTANEOUS | Status: DC | PRN

## 2021-11-21 MED ORDER — CHLORHEXIDINE GLUCONATE CLOTH 2 % EX PADS
6.0000 | MEDICATED_PAD | Freq: Every day | CUTANEOUS | Status: DC
  Administered 2021-11-21 – 2021-11-26 (×6): 6 via TOPICAL

## 2021-11-21 NOTE — Progress Notes (Signed)
Femoral line discontinued. Pressure held for approximately 35 minutes. Quickclot applied and bleeding is under control now. Site is gauzed and wrapped with 4 lb weights applied on top. Vitals are stable at this time although we are unable to obtain any accurate O2 readings with oximetry.

## 2021-11-21 NOTE — Progress Notes (Signed)
Subjective:  eventful weekend-  felt to be terminal but then perked up-  last HD was on 10/5 but not completed due to low BP and sending to the hospital -  was too unstable during first few days of hospital   Objective Vital signs in last 24 hours: Vitals:   11/21/21 0500 11/21/21 0604 11/21/21 0800 11/21/21 0817  BP: (!) 147/114 (!) 132/101    Pulse: 100 (!) 104    Resp: 14 16    Temp:    (!) 97.4 F (36.3 C)  TempSrc:    Axillary  SpO2: 91% 97% 100%   Weight: 86.5 kg     Height:       Weight change: -0.3 kg  Intake/Output Summary (Last 24 hours) at 11/21/2021 0903 Last data filed at 11/21/2021 0330 Gross per 24 hour  Intake 279.64 ml  Output --  Net 279.64 ml   OP HD orders:  [facility], TTS, 3 hours 15 minutes.  EDW 81 kg.  2K/2.5 calcium.  RUE AVF 400/500.  Meds: Mircera 200 mcg every 2 weeks, Venofer 50 mg q, calcitriol 0.5 mcg 3 times a week, Sensipar 30 mg 3 times a week.  No heparin (history of GI bleed)   Assessment/ Plan: Pt is a 81 y.o. yo female who was admitted on 11/14/2021 with hypotension /sepsis Assessment/Plan: 1. Sepsis-  thought due to PNA- has responded to abx and BP has normalized  2. ESRD - normally TTS-  last done not full treatment on 10/5-  then felt to be too unstable and did not do HD over weekend-  family now wishes to resume-  will plan for today off schedule via AVF-  may keep on MWF while here given lots of TTS pts in house at present  3. Anemia-  will add back ESA 4. Secondary hyperparathyroidism- will order calcitriol , sensipar-  no binder yet  5. HTN/volume- developing hyponatremia argues for volume overload -  UF as able today -  on midodrine 6. Dispo-  will see how HD goes today -  not so much for hemodynamic instability but I am worried about her ability to cooperate-  and given that she has an AVF this could be a problem   Louis Meckel    Labs: Basic Metabolic Panel: Recent Labs  Lab 11/18/21 0729 11/19/21 0415  11/20/21 1328  NA 132* 132* 129*  K 5.0 5.1 5.5*  CL 93* 92* 91*  CO2 '28 26 23  '$ GLUCOSE 167* 124* 118*  BUN 48* 54* 61*  CREATININE 3.65* 3.89* 4.61*  CALCIUM 9.2 9.3 9.3   Liver Function Tests: Recent Labs  Lab 12/05/2021 1115 11/18/21 0729 11/19/21 0415  AST '20 20 17  '$ ALT '19 21 19  '$ ALKPHOS 148* 130* 106  BILITOT 1.1 1.4* 1.7*  PROT 5.9* 6.1* 5.9*  ALBUMIN 3.2* 3.4* 3.3*   No results for input(s): "LIPASE", "AMYLASE" in the last 168 hours. No results for input(s): "AMMONIA" in the last 168 hours. CBC: Recent Labs  Lab 11/27/2021 1115 11/18/21 0729 11/19/21 0415 11/20/21 1328  WBC 3.9* 7.4 4.5 3.0*  NEUTROABS 3.0  --   --   --   HGB 10.3* 10.3* 9.9* 9.2*  HCT 36.0 35.5* 33.7* 30.4*  MCV 112.9* 110.9* 109.8* 106.3*  PLT 87* 130* 107* 86*   Cardiac Enzymes: No results for input(s): "CKTOTAL", "CKMB", "CKMBINDEX", "TROPONINI" in the last 168 hours. CBG: Recent Labs  Lab 11/13/2021 1135  GLUCAP 162*    Iron  Studies: No results for input(s): "IRON", "TIBC", "TRANSFERRIN", "FERRITIN" in the last 72 hours. Studies/Results: No results found. Medications: Infusions:  norepinephrine (LEVOPHED) Adult infusion Stopped (11/20/21 0857)   piperacillin-tazobactam (ZOSYN) 2.25 g in sodium chloride 0.9 % 50 mL IVPB 2.25 g (11/21/21 0604)   [START ON 11/22/2021] vancomycin      Scheduled Medications:  apixaban  2.5 mg Oral BID   Chlorhexidine Gluconate Cloth  6 each Topical Daily   ipratropium-albuterol  3 mL Nebulization TID   midodrine  10 mg Oral TID WC   mouth rinse  15 mL Mouth Rinse 4 times per day    have reviewed scheduled and prn medications.  Physical Exam: General: pt confused-  mittens on Heart: RRR Lungs: ant clear Abdomen: obese Extremities: pitting dep edema  Dialysis Access: right upper AVF-  patent     11/21/2021,9:03 AM  LOS: 4 days

## 2021-11-21 NOTE — TOC Initial Note (Signed)
Transition of Care [facility]) - Initial/Assessment Note    Patient Details  [name] MRN: [id number] Date of Birth: 04/20/1940  [facility] CM/SW Contact:    Boneta Lucks, RN Phone Number: 11/21/2021, 2:30 PM  Clinical Narrative:      Patient admitted with sepsis. TOC has been follow with palliative. Patient has improved, still in fluid overload, need dialysis and family wants to treat. TOC to follow for discharge plan.              Expected Discharge Plan: Long Term Acute Care (LTAC) Barriers to Discharge: Continued Medical Work up    Expected Discharge Plan and Services Expected Discharge Plan: [place] (LTAC)         Activities of Daily Living Home Assistive Devices/Equipment: [employer] fast streamer ADL Screening (condition at time of admission) Patient's cognitive ability adequate to safely complete daily activities?: Yes Is the patient deaf or have difficulty hearing?: No Does the patient have difficulty seeing, even when wearing glasses/contacts?: No Does the patient have difficulty concentrating, remembering, or making decisions?: No Patient able to express need for assistance with ADLs?: Yes Does the patient have difficulty dressing or bathing?: Yes Independently performs ADLs?: No Communication: Independent Dressing (OT): Dependent Is this a change from baseline?: Pre-admission baseline Grooming: Dependent Is this a change from baseline?: Pre-admission baseline Feeding: Dependent Is this a change from baseline?: Pre-admission baseline Bathing: Dependent Is this a change from baseline?: Pre-admission baseline Toileting: Dependent Is this a change from baseline?: Pre-admission baseline In/Out Bed: Dependent Is this a change from baseline?: Pre-admission baseline Walks in Home: Dependent Is this a change from baseline?: Pre-admission baseline Does the patient have difficulty walking or climbing stairs?: Yes Weakness of Legs:  Both Weakness of Arms/Hands: Both  Permission Sought/Granted        Admission diagnosis:  Shock ([place]) [R57.9] Acute respiratory failure with hypercapnia ([place]) [J96.02] Hypothermia, initial encounter [T68.XXXA] Sepsis due to undetermined organism [facility]) [A41.9] Patient Active Problem List   Diagnosis Date Noted   Hyperkalemia 11/20/2021   Sepsis due to undetermined organism ([place]) 11/16/2021   Hypothermia    Dysphagia 11/01/2021   CAP (community acquired pneumonia) 10/28/2021   Elevated troponin 10/28/2021   Acute respiratory failure with hypoxia ([place]) 10/27/2021   Goals of care, counseling/discussion 10/23/2021   Renal failure 09/07/2021   Hypotension 08/10/2021   Class 1 obesity 07/26/2021   Chest pain 07/02/2021   Pericardial friction rub 06/03/2021   Acute on chronic respiratory failure with hypoxia and hypercapnia ([place]) 06/01/2021   Septic shock ([place]) 06/01/2021   Persistent atrial fibrillation ([place]) 06/01/2021   Thrombocytopenia ([place]) 05/31/2021   Acute hematogenous osteomyelitis of right foot ([place]) 05/23/2021   PAD (peripheral artery disease) ([place]) 05/23/2021   Leg pain 53/97/6734   Acute metabolic encephalopathy 19/37/9024   Sinus bradycardia 04/07/2021   Hypokalemia 04/05/2021   History of CVA (cerebrovascular accident) 04/04/2021   Acute osteomyelitis of left foot ([place]) 04/04/2021   Anemia of chronic kidney failure 04/04/2021   Hyponatremia 04/04/2021   Memory loss 04/04/2021   Pressure injury of skin 03/04/2021   Rectal bleeding 12/27/2019   NSVT (nonsustained ventricular tachycardia) ([place]) 10/27/2019   Megaloblastic anemia 10/26/2019   Chronic blood loss anemia 10/26/2017   Iron deficiency anemia 02/23/2017   Acute on chronic combined systolic and diastolic CHF (congestive heart failure) ([place]) 10/15/2016   ESRD on dialysis ([place]) 10/15/2016   NSTEMI (non-ST elevated myocardial infarction) ([place]) 10/03/2015   COPD (chronic obstructive  pulmonary disease) ([place])     Anemia in chronic kidney disease, Stage IV 06/05/2015   CHF exacerbation ([place]) 06/05/2015   TIA (transient ischemic attack) 09/02/2014   Gait difficulty 09/02/2014   Other specified transient cerebral ischemias    Stroke with cerebral ischemia (HCC)    Hemispheric carotid artery syndrome    Essential hypertension    Hyperlipidemia    PCP:  Monico Blitz, MD Pharmacy:   [name] [place] [address] Phone: [phone] Fax: [phone]   Readmission Risk Interventions    08/15/2021   11:49 AM 06/07/2021    2:47 PM 06/01/2021   10:59 AM  Readmission Risk Prevention Plan  Transportation Screening Complete Complete Complete  Medication Review (RN Care Manager)  Complete Complete  PCP or Specialist appointment within 3-5 days of discharge  Complete   HRI or Home Care Consult Complete Complete Complete  SW Recovery Care/Counseling Consult Complete Complete Complete  Palliative Care Screening Not Applicable Complete Not Applicable  Skilled Nursing Facility Complete Complete Complete

## 2021-11-21 NOTE — Procedures (Signed)
   HEMODIALYSIS TREATMENT NOTE:   POA granted consent for HD earlier today - witnessed by Dr. Carles Collet.  Pre-HD pt is restless, fidgeting, picking at telemetry leads, PIV, and nasal cannula.  "It's tight around my neck!"  I showed her that I could put my hand between the tubing and her neck.  "Take it off!!" One safety mitt secured for right hand.  Treatment initiation was delayed due to patient requiring 1:1 monitoring for safety.  We waited until KDU was vacant.  She initially refused treatment, grimacing and stating, "I don't wanna be stuck NO more."  She wouldn't straighten her arm to capture BP and when I attempted to guide her to extend her arm she said, "I will knock your teeth out!"  Readings of 162/143 with arm bent and 130s over 60s with arm extended.  Treatment was eventually started.  At her request she was repositioned numerous times, each time crying "my back, my back."   She asked for a lift to pick her up and put her in a chair.  I explained this was impossible with HD underway and she began to cry.  "How would you like to lay in a bed all day and all night and hurt like this?!"  AC Jody [name] also offered support and helped to reposition pt yet again.  Restlessness persisted with right arm movement triggering machine alarms.  She became increasingly frustrated with my reminding her that she had needles in arm.  "Where they at??  Take them out!"  Ultimately she completely flexed her right arm, occluding the access, and refused to extend.  When I placed my hand on her safety mitt to coax extension she began scratching my arm with her left hand.  At this point, I began blood return and called Dr. Hollie Salk who ordered to end the treatment.  Current behavior poses risk of needle dislodgement and/or infiltration.  She already had a 4x6cm hematoma and bruising (present on admission) from a prior infiltration.  Total run time: 1 hour 6 minutes Net UF:  0.5 liters  No meds given. Transported to 317  where bedside report and deferment of Vancomycin was given to Jody [name], Therapist, sports.  POA [name] was called for an update but call went to VM.   Jody [place], Jody [name]

## 2021-11-21 NOTE — Progress Notes (Signed)
PROGRESS NOTE  Jody [name] KGY:[id number] DOB: 06/24/1940 DOA: 11/29/2021 PCP: Monico Blitz, MD  Brief History:  81 year old female with history of stroke, COPD, chronic respiratory failure on 3 L, systolic and diastolic CHF, persistent atrial fibrillation, ESRD, iron deficiency presenting from her nursing facility [facility]) from which she is a permanent resident secondary to altered mental status and hypotension.  Patient was at HD the morning of admission.  After 25 min of HD, patient was hypotensive and somnolent.  Pt unable to provide history due to acute encephalopathy.   At baseline, patient is conversant although pleasantly confused occasionally.  She is bedbound at baseline.  Apparently, the patient was was her usual self prior to going to dialysis on the morning of 11/21/2021. The patient was most recently admitted to the hospital from 10/27/2021 to 11/03/21 for acute on chronic respiratory failure which was thought to be multifactorial secondary to pneumonia, atrial fibrillation with RVR, fluid overload.  The patient was discharged back to her facility with 3 more days of cefdinir and doxycycline. In the ED, the patient was hypothermic with a temperature of 93.7.  She was hypotensive initially with blood pressure 57/43.  The patient was placed on nonrebreather with saturation 100%.  Chest x-ray showed bilateral perihilar opacities.  ABG showed seven-point 1/96/190/29 on 100%.  The patient was started on Levophed 5 mcg/kg/min.  Vancomycin and Zosyn were started.  EKG showed atrial fibrillation with right bundle branch block.  I had a goals of care discussion with the patient's niece who is her POA.  She confirms the patient is DNR status.  she wants to continue aggressive care at least for the next 24 hours.  I requested Dr. Sabra Heck the place central line.  BiPAP was ordered.  11/18/21--patient is obtunded, minimally responsive to protopathic stimuli--occasional withdraw.  Levophed  up 5 mcg>>18mg.  Remains on BiPAP with no improvement on ABG Discussed GOC with niece.  Niece felt patient was awake and conversant and recognized her last night.  She states pt was able to communicate her needs.  This was not corroborated by nursing staff.  Niece wants to continue care and will visit patient this evening to confirm my findings of clinical worsening.  Nephrology consulted.  PCCM following.  Palliative saw patient.  11/19/21--discussed with Dr. SHalford Chessmanand Ms. Dalton on 10/6 and Ms.Dalton again on 10/7.  Agreed to take pt off BiPAP with no plan to resume it --patient is confused, waxing and waning out of consciousness. Remains on levophed --discussed with Ms. Dalton that patient is gradually and continuing declining medically and continuing medications with curative intent (antibiotics) and vasopressor will prolong patient's suffering.  Ms DKirk Ruthsagreed that pt is suffering and did not want to prolong the process.  She wanted patient to be comfortable and allow "the Lord to take her when it's time".  She agreed for antibiotic discontinuation.   11/20/21--patient awake and alert and conversant.  Stable on 3L [place].  Discussed with POA [name], who request patient be placed back on dialysis.  Now that patient is "stable", [name] requested blood work be restarted and patient be restarted on appropriate medications.  Discussed case with nephrology, Dr. SGlory Rosebushimmediate need for HD presently, but will reconsult in am.  Loklema ordered  11/21/21--pt awake and conversant.  Intermittently confused but pleasant.  Stable on 3L.  Nephrology re-consulted with plans for HD today.  Continue antibiotics and midodrine.  Continue full care outside of  DNR per POA.     Assessment and Plan: Septic shock -likely due to  pneumonia -presented with hypothermia, hypotension, altered mental status -lactic acid1.0 -PCT0.74>>0.80>>1.00 -restart empiric vanc/zosyn (vanc dosed on 10/5) -follow blood  culture>>neg -levophed continues per family request>>weaned off 10/8 -restart midodrine per POA request   Acute on chronic respiratory failure with hypoxia and hypercapnia ([place]) Personally reviewed CXR--increased interstitial markings; bilateral perihilar opacities remain off BiPAP per patient and family request Chronic on 3-4 L 10/5 ABG 7.1/96/190/29 (1.0) 10/6 ABG 7.15/85/95/29 (0.4) Appreciate PCCM -10/9--back on her baseline 3L   [place] Pneumonia -discontinue on zosyn, vanc and we are transitioning to a focus of comfort measures -11/20/21  POA [name] wanted to restart medications   Acute metabolic Encephalopathy -patient is obtunded, less responsive than 11/22/2021 -occasional withdraw to protopathic stimuli initially -10/8-- more awake and conversant -10/9--awake and conversant, pleasantly confused   ESRD on dialysis [facility]) Schedule--Tuesday Thursday Saturday.  -last full HD session 10/3  Reports compliance with her HD sessions. -discussed with nephrology initially, Dr. [place] Bali is not dialysis or CRRT candidate -10/8  POA wanted to restart HD.  Discussed with Dr. Glory Rosebush immediate need for HD presently, will reconsult in am -10/9 plan for HD today per renal   Acute on chronic combined systolic and diastolic CHF (congestive heart failure) ([place]) - Manage volume with HD -06/03/21 Echo EF 50%, RV overload, severe TR -08/02/20 Echo EF 45-50% HK inferoseptal, inferolateral, Inf wall; mod TR --10/24/21 Echo-EF 40-45%, global HK, RV overload, PASP 57.5 --clinically fluid overloaded presently --hold metoprolol succinate due to hypotension   Dysphagia -evaluated by speech last admission -continue dys 2 diet with thin liquids last admission -restart diet now that patient is more awake   Persistent atrial fibrillation ([place]) On anticoagulation with Eliquis. Rate Controlled, heart rate 50s to 90s.  Not on rate limiting agents. -transitioned to [name] lovenox initially due to  inability to take po - Resumed Eliquis -now transitioning to more comfort focused approach   Essential hypertension Previously on metoprolol>>dose was decreased to 12.5 mg daily Off metoprolol due to hypotension   Thrombocytopenia (HCC) Fairly chronic dating back to Feb 2023 -B12--1435 -folate-7.0 -TSH--1.132 -acute worsening due to sepsis   PAD (peripheral artery disease) (HCC) - Continue statin and ASA when able to tolerate po     Goals of care, counseling/discussion Discussed with the patient's POA, [name] last admission 10/23/21 Discussed GOC again with [name] 10/5>>confirmed DNR -- I discussed the patient's overall poor prognosis given her frailty, multiple comorbid conditions, numerous hospitalizations and poor functional baseline -10/6 discussed again with niece [name]--updated her that patient is clinically worse than 10/5>>she wishes to continue full care for now - 10/7 discussed with Ms. Dalton--see above -11/20/21--patient awake and alert and conversant.  Stable on 3L.  Discussed with POA [name], who request patient be placed back on dialysis.  Now that patient is "stable", [name] requested blood work be restarted and patient be restarted on appropriate medications.   -11/21/21--continue care outside of DNR     Family Communication:   Niece  updated 10/9   Consultants:  renal, PCCM, palliative   Code Status:  DNR   DVT Prophylaxis:  [place] Lovenox     Procedures: As Listed in Progress Note Above   Antibiotics: Vanc 10/5>.10/7---restart 10/8>> Zosy 10/5>>10/7--restart 10/8>>        Subjective: Patient denies fevers, chills, headache, chest pain, dyspnea, nausea, vomiting, diarrhea, abdominal pain, dysuria, hematuria, hematochezia, and melena.   Objective: Vitals:  11/21/21 0604 11/21/21 0800 11/21/21 0817 11/21/21 0900  BP: (!) 132/101   (!) 119/94  Pulse: (!) 104 (!) 49  95  Resp: '16 10  14  '$ Temp:   (!) 97.4 F (36.3 C)   TempSrc:    Axillary   SpO2: 97% 100%  100%  Weight:      Height:        Intake/Output Summary (Last 24 hours) at 11/21/2021 1101 Last data filed at 11/21/2021 0330 Gross per 24 hour  Intake 270.54 ml  Output --  Net 270.54 ml   Weight change: -0.3 kg Exam:  General:  Pt is alert, follows commands appropriately, not in acute distress HEENT: No icterus, No thrush, No neck mass, [place]/AT Cardiovascular: IRRR, S1/S2, no rubs, no gallops Respiratory: bibasilar rales. No wheeze Abdomen: Soft/+BS, non tender, non distended, no guarding Extremities: 2 + LE edema, No lymphangitis, No petechiae, No rashes, no synovitis   Data Reviewed: I have personally reviewed following labs and imaging studies Basic Metabolic Panel: Recent Labs  Lab 12/04/2021 1115 11/18/21 0729 11/19/21 0415 11/20/21 1328  NA 132* 132* 132* 129*  K 4.5 5.0 5.1 5.5*  CL 94* 93* 92* 91*  CO2 '29 28 26 23  '$ GLUCOSE 170* 167* 124* 118*  BUN 44* 48* 54* 61*  CREATININE 3.30* 3.65* 3.89* 4.61*  CALCIUM 8.9 9.2 9.3 9.3   Liver Function Tests: Recent Labs  Lab 11/16/2021 1115 11/18/21 0729 11/19/21 0415  AST '20 20 17  '$ ALT '19 21 19  '$ ALKPHOS 148* 130* 106  BILITOT 1.1 1.4* 1.7*  PROT 5.9* 6.1* 5.9*  ALBUMIN 3.2* 3.4* 3.3*   No results for input(s): "LIPASE", "AMYLASE" in the last 168 hours. No results for input(s): "AMMONIA" in the last 168 hours. Coagulation Profile: Recent Labs  Lab 11/13/2021 1115  INR 1.3*   CBC: Recent Labs  Lab 11/23/2021 1115 11/18/21 0729 11/19/21 0415 11/20/21 1328  WBC 3.9* 7.4 4.5 3.0*  NEUTROABS 3.0  --   --   --   HGB 10.3* 10.3* 9.9* 9.2*  HCT 36.0 35.5* 33.7* 30.4*  MCV 112.9* 110.9* 109.8* 106.3*  PLT 87* 130* 107* 86*   Cardiac Enzymes: No results for input(s): "CKTOTAL", "CKMB", "CKMBINDEX", "TROPONINI" in the last 168 hours. BNP: Invalid input(s): "POCBNP" CBG: Recent Labs  Lab 11/21/2021 1135  GLUCAP 162*   HbA1C: No results for input(s): "HGBA1C" in the last 72  hours. Urine analysis:    Component Value Date/Time   COLORURINE YELLOW 05/27/2014 1902   APPEARANCEUR CLEAR 05/27/2014 1902   LABSPEC 1.012 05/27/2014 1902   PHURINE 5.0 05/27/2014 1902   GLUCOSEU NEGATIVE 05/27/2014 1902   HGBUR NEGATIVE 05/27/2014 1902   BILIRUBINUR NEGATIVE 05/27/2014 1902   KETONESUR NEGATIVE 05/27/2014 1902   PROTEINUR 100 (A) 05/27/2014 1902   UROBILINOGEN 0.2 05/27/2014 1902   NITRITE NEGATIVE 05/27/2014 1902   LEUKOCYTESUR NEGATIVE 05/27/2014 1902   Sepsis Labs: '@LABRCNTIP'$ (procalcitonin:4,lacticidven:4) ) Recent Results (from the past 240 hour(s))  Culture, blood (Routine X 2) w Reflex to ID Panel     Status: None (Preliminary result)   Collection Time: 12/13/2021  3:58 PM   Specimen: BLOOD RIGHT WRIST  Result Value Ref Range Status   Specimen Description BLOOD RIGHT WRIST  Final   Special Requests   Final    BOTTLES DRAWN AEROBIC AND ANAEROBIC Blood Culture adequate volume   Culture   Final    NO GROWTH 4 DAYS Performed at [facility], [address] [place], [place]  [address]    Report Status PENDING  Incomplete  Culture, blood (Routine X 2) w Reflex to ID Panel     Status: None (Preliminary result)   Collection Time: 11/16/2021  4:14 PM   Specimen: BLOOD RIGHT HAND  Result Value Ref Range Status   Specimen Description BLOOD RIGHT HAND  Final   Special Requests   Final    BOTTLES DRAWN AEROBIC AND ANAEROBIC Blood Culture adequate volume   Culture   Final    NO GROWTH 4 DAYS Performed at [facility], [address] [place], [place] [address]    Report Status PENDING  Incomplete     Scheduled Meds:  apixaban  2.5 mg Oral BID   calcitRIOL  0.5 mcg Oral Q M,W,F-HD   Chlorhexidine Gluconate Cloth  6 each Topical Daily   Chlorhexidine Gluconate Cloth  6 each Topical Q0600   cinacalcet  30 mg Oral Q M,W,F-HD   darbepoetin (ARANESP) injection - DIALYSIS  100 mcg Intravenous Q Mon-HD   ipratropium-albuterol  3 mL Nebulization TID   midodrine  10  mg Oral TID WC   mouth rinse  15 mL Mouth Rinse 4 times per day   Continuous Infusions:  norepinephrine (LEVOPHED) Adult infusion Stopped (11/20/21 0857)   piperacillin-tazobactam (ZOSYN) 2.25 g in sodium chloride 0.9 % 50 mL IVPB 2.25 g (11/21/21 0604)   [START ON 11/22/2021] vancomycin      Procedures/Studies: CT HEAD WO CONTRAST  Result Date: 11/20/2021 CLINICAL DATA:  An 81 year old female presents with mental status changes of unknown cause. EXAM: CT HEAD WITHOUT CONTRAST TECHNIQUE: Contiguous axial images were obtained from the base of the skull through the vertex without intravenous contrast. RADIATION DOSE REDUCTION: This exam was performed according to the departmental dose-optimization program which includes automated exposure control, adjustment of the mA and/or kV according to patient size and/or use of iterative reconstruction technique. COMPARISON:  June 04, 2021. FINDINGS: Brain: No evidence of acute infarction, hemorrhage, hydrocephalus, extra-axial collection or mass lesion/mass effect. Signs of prior RIGHT parietal-occipital infarct with encephalomalacia. Signs of cerebral atrophy. Vascular: No hyperdense vessel or unexpected calcification. Skull: Normal. Negative for fracture or focal lesion. LEFT mastoid and middle ear effusion. All mastoid air cells are opacified. No stranding adjacent to mastoid tip per signs of bony destruction. Signs of renal osteodystrophy throughout the calvarium Sinuses/Orbits: No acute finding. Other: None IMPRESSION: 1. No acute intracranial abnormality. 2. Signs of atrophy, chronic microvascular ischemic change in prior infarct with encephalomalacia. 3. LEFT mastoid and middle ear effusion. No stranding adjacent to mastoid tip or signs of bony destruction. Correlate with any signs of otitis media and any clinical signs to suggest early mastoiditis. 4. Signs of renal osteodystrophy throughout the calvarium. Electronically Signed   By: Zetta Bills M.D.    On: 12/10/2021 15:01   DG Chest Port 1 View  Result Date: 11/25/2021 CLINICAL DATA:  Provided history: Altered mental status. EXAM: PORTABLE CHEST 1 VIEW COMPARISON:  Prior chest radiographs 11/05/2021. FINDINGS: Shows for age radiograph. Cardiomegaly. Opacities within the perihilar lungs bilaterally, and left lung base. Mild atelectasis within the right lung base. Suspected trace left pleural effusion. No evidence of pneumothorax. Degenerative changes of the spine. IMPRESSION: 1. Shallow inspiration radiograph. 2. Opacities within the perihilar lungs bilaterally, and left lung base. Findings may reflect atelectasis and/or pneumonia. 3. Minimal atelectasis within the right lung base. 4. Cardiomegaly. 5. Aortic Atherosclerosis (ICD10-I70.0). Electronically Signed   By: Kellie Simmering D.O.   On: 12/08/2021 12:14  DG Chest Portable 1 View  Result Date: 11/05/2021 CLINICAL DATA:  Hypoxia EXAM: PORTABLE CHEST 1 VIEW COMPARISON:  11/02/2021 chest radiograph. FINDINGS: Low lung volumes. Stable cardiomediastinal silhouette with mild cardiomegaly. No pneumothorax. No significant pleural effusions. No overt pulmonary edema. Patchy bibasilar lung opacities, similar. IMPRESSION: 1. Stable cardiomegaly without overt pulmonary edema. 2. Low lung volumes with patchy bibasilar lung opacities, similar, favor atelectasis, with component of aspiration or pneumonia not excluded. Electronically Signed   By: Ilona Sorrel M.D.   On: 11/05/2021 17:53   DG CHEST PORT 1 VIEW  Result Date: 11/02/2021 CLINICAL DATA:  Respiratory distress EXAM: PORTABLE CHEST 1 VIEW COMPARISON:  10/27/2021 FINDINGS: Poor inspiration. Cardiomegaly and aortic atherosclerosis. Pulmonary venous hypertension with mild interstitial edema. Small effusions and lower lobe atelectasis. IMPRESSION: Findings most consistent with congestive heart failure. Cannot rule out coexistent pneumonia in the lower lobes. Similar appearance compared to the study of 09/14.  There may be slightly worse volume loss at the left base. Electronically Signed   By: Nelson Chimes M.D.   On: 11/02/2021 13:36   DG Chest Port 1 View  Result Date: 10/27/2021 CLINICAL DATA:  Shortness of breath EXAM: PORTABLE CHEST 1 VIEW COMPARISON:  Radiographs 10/23/2021 FINDINGS: Stable cardiomegaly. Right basilar atelectasis/consolidation. Interstitial markings are prominent bilaterally likely due to low lung volumes and chronic interstitial lung disease. Question trace pleural effusions bilaterally are pleural thickening. No pneumothorax. Elevated right hemidiaphragm. No acute osseous abnormality. Aortic atherosclerotic calcification. IMPRESSION: Elevated right hemidiaphragm with associated presumed atelectasis though pneumonia is difficult to exclude. Hypoinflation and chronic interstitial prominence. Bilateral trace pleural effusions or pleural thickening. Cardiomegaly. Electronically Signed   By: Placido Sou M.D.   On: 10/27/2021 19:15   ECHOCARDIOGRAM LIMITED  Result Date: 10/23/2021    ECHOCARDIOGRAM LIMITED REPORT   Patient Name:   Jody [name] Date of Exam: 10/23/2021 Medical Rec #:  [id number]       Height:       62.0 in Accession #:    [id number]      Weight:       174.8 lb Date of Birth:  07-13-1940       BSA:          1.805 m Patient Age:    57 years        BP:           124/73 mmHg Patient Gender: F               HR:           99 bpm. Exam Location:  [facility] Procedure: Limited Echo, Limited Color Doppler and Cardiac Doppler Indications:    CHF-Acute Diastolic J82.50  History:        Patient has prior history of Echocardiogram examinations, most                 recent 06/03/2021. CHF, Previous Myocardial Infarction, Stroke                 and COPD, Arrythmias:Atrial Fibrillation; Risk                 Factors:Hypertension, Diabetes and Dyslipidemia. Hx of ESRD on                 dialysis,.  Sonographer:    Alvino Chapel RCS Referring Phys: [phone] Quynh Basso IMPRESSIONS  1. Left  ventricular ejection fraction, by estimation, is 40 to 45%. The left ventricle has mildly decreased function. The left ventricle  demonstrates global hypokinesis. There is moderate concentric left ventricular hypertrophy. Left ventricular diastolic function could not be evaluated. There is the interventricular septum is flattened in systole and diastole, consistent with right ventricular pressure and volume overload.  2. Right ventricular systolic function is moderately reduced. The right ventricular size is normal. A Prominent moderator band is visualized. There is moderately elevated pulmonary artery systolic pressure. The estimated right ventricular systolic pressure is 96.7 mmHg.  3. Left atrial size was severely dilated.  4. Right atrial size was severely dilated.  5. The mitral valve is degenerative. Moderate mitral annular calcification.  6. Tricuspid valve regurgitation is moderate.  7. The aortic valve is tricuspid. There is moderate calcification of the aortic valve. There is moderate thickening of the aortic valve. No aortic stenosis is present.  8. The inferior vena cava is dilated in size with <50% respiratory variability, suggesting right atrial pressure of 15 mmHg. FINDINGS  Left Ventricle: Left ventricular ejection fraction, by estimation, is 40 to 45%. The left ventricle has mildly decreased function. The left ventricle demonstrates global hypokinesis. The left ventricular internal cavity size was normal in size. There is  moderate concentric left ventricular hypertrophy. Abnormal (paradoxical) septal motion, consistent with left bundle branch block and the interventricular septum is flattened in systole and diastole, consistent with right ventricular pressure and volume overload. Left ventricular diastolic function could not be evaluated. Left ventricular diastolic function could not be evaluated due to atrial fibrillation. Right Ventricle: The right ventricular size is normal. No increase in right  ventricular wall thickness. Right ventricular systolic function is moderately reduced. There is moderately elevated pulmonary artery systolic pressure. The tricuspid regurgitant velocity is 3.26 m/s, and with an assumed right atrial pressure of 15 mmHg, the estimated right ventricular systolic pressure is 89.3 mmHg. Left Atrium: Left atrial size was severely dilated. Right Atrium: Right atrial size was severely dilated. Pericardium: There is no evidence of pericardial effusion. Mitral Valve: The mitral valve is degenerative in appearance. There is moderate thickening of the mitral valve leaflet(s). There is moderate calcification of the mitral valve leaflet(s). Moderate mitral annular calcification. Tricuspid Valve: The tricuspid valve is normal in structure. Tricuspid valve regurgitation is moderate . No evidence of tricuspid stenosis. Aortic Valve: The aortic valve is tricuspid. There is moderate calcification of the aortic valve. There is moderate thickening of the aortic valve. No aortic stenosis is present. Pulmonic Valve: The pulmonic valve was normal in structure. Pulmonic valve regurgitation is not visualized. No evidence of pulmonic stenosis. Aorta: The aortic root is normal in size and structure. Venous: The inferior vena cava is dilated in size with less than 50% respiratory variability, suggesting right atrial pressure of 15 mmHg. IAS/Shunts: No atrial level shunt detected by color flow Doppler. Additional Comments: A Prominent moderator band is visualized. LEFT VENTRICLE PLAX 2D LVIDd:         4.10 cm LVIDs:         3.50 cm LV PW:         1.50 cm LV IVS:        1.40 cm LVOT diam:     1.80 cm LVOT Area:     2.54 cm  LV Volumes (MOD) LV vol d, MOD A2C: 78.4 ml LV vol d, MOD A4C: 47.9 ml LV vol s, MOD A2C: 40.6 ml LV vol s, MOD A4C: 27.3 ml LV SV MOD A2C:     37.8 ml LV SV MOD A4C:     47.9 ml LV SV  MOD BP:      30.1 ml RIGHT VENTRICLE TAPSE (M-mode): 1.4 cm LEFT ATRIUM         Index LA diam:    4.50 cm  2.49 cm/m   AORTA Ao Root diam: 3.60 cm TRICUSPID VALVE TR Peak grad:   42.5 mmHg TR Vmax:        326.00 cm/s  SHUNTS Systemic Diam: 1.80 cm Fransico Him MD Electronically signed by Fransico Him MD Signature Date/Time: 10/23/2021/2:12:30 PM    Final    DG CHEST PORT 1 VIEW  Result Date: 10/23/2021 CLINICAL DATA:  Respiratory distress. EXAM: PORTABLE CHEST 1 VIEW COMPARISON:  Chest x-rays dated 10/20/2021, 09/06/2021 and 09/05/2021. FINDINGS: Stable cardiomegaly. Lungs are stable. No confluence opacity to suggest a developing pneumonia. No evidence of overt alveolar pulmonary edema. Coarse lung markings bilaterally suggests chronic interstitial lung disease/fibrosis. There is also chronic elevation of the RIGHT hemidiaphragm. No acute-appearing osseous abnormality. IMPRESSION: 1. No active disease. No evidence of pneumonia or alveolar pulmonary edema. 2. Stable cardiomegaly. 3. Probable chronic interstitial lung disease/fibrosis. 4. Stable pleural thickening/fluid at the RIGHT lung fissure. Electronically Signed   By: Franki Cabot M.D.   On: 10/23/2021 11:13    Orson Eva, DO  [facility]  If 7PM-7AM, please contact night-coverage [email or web address] Password [email or web address] 11/21/2021, 11:01 AM   LOS: 4 days

## 2021-11-22 DIAGNOSIS — I4819 Other persistent atrial fibrillation: Secondary | ICD-10-CM | POA: Diagnosis not present

## 2021-11-22 DIAGNOSIS — A419 Sepsis, unspecified organism: Secondary | ICD-10-CM | POA: Diagnosis not present

## 2021-11-22 DIAGNOSIS — J9602 Acute respiratory failure with hypercapnia: Secondary | ICD-10-CM | POA: Diagnosis not present

## 2021-11-22 DIAGNOSIS — E875 Hyperkalemia: Secondary | ICD-10-CM | POA: Diagnosis not present

## 2021-11-22 LAB — CULTURE, BLOOD (ROUTINE X 2)
Culture: NO GROWTH
Culture: NO GROWTH
Special Requests: ADEQUATE
Special Requests: ADEQUATE

## 2021-11-22 LAB — HEPATITIS B SURFACE ANTIBODY, QUANTITATIVE: Hep B S AB Quant (Post): 60.6 m[IU]/mL (ref >9.9)

## 2021-11-22 LAB — MRSA NEXT GEN BY PCR, NASAL: MRSA by PCR Next Gen: NOT DETECTED

## 2021-11-22 MED ORDER — ALPRAZOLAM 0.5 MG PO TABS
0.5000 mg | ORAL_TABLET | ORAL | Status: DC
  Filled 2021-11-22: qty 1

## 2021-11-22 MED ORDER — ACETAMINOPHEN 325 MG PO TABS
650.0000 mg | ORAL_TABLET | Freq: Four times a day (QID) | ORAL | Status: DC | PRN
  Administered 2021-11-22 – 2021-11-26 (×4): 650 mg via ORAL
  Filled 2021-11-22 (×4): qty 2

## 2021-11-22 NOTE — Progress Notes (Signed)
PROGRESS NOTE  Jody [name] DJS:[id number] DOB: 20-Aug-1940 DOA: 11/19/2021 PCP: Monico Blitz, MD  Brief History:  81 year old female with history of stroke, COPD, chronic respiratory failure on 3 L, systolic and diastolic CHF, persistent atrial fibrillation, ESRD, iron deficiency presenting from her nursing facility [facility]) from which she is a permanent resident secondary to altered mental status and hypotension.  Patient was at HD the morning of admission.  After 25 min of HD, patient was hypotensive and somnolent.  Pt unable to provide history due to acute encephalopathy.   At baseline, patient is conversant although pleasantly confused occasionally.  She is bedbound at baseline.  Apparently, the patient was was her usual self prior to going to dialysis on the morning of 12/08/2021. The patient was most recently admitted to the hospital from 10/27/2021 to 11/03/21 for acute on chronic respiratory failure which was thought to be multifactorial secondary to pneumonia, atrial fibrillation with RVR, fluid overload.  The patient was discharged back to her facility with 3 more days of cefdinir and doxycycline. In the ED, the patient was hypothermic with a temperature of 93.7.  She was hypotensive initially with blood pressure 57/43.  The patient was placed on nonrebreather with saturation 100%.  Chest x-ray showed bilateral perihilar opacities.  ABG showed seven-point 1/96/190/29 on 100%.  The patient was started on Levophed 5 mcg/kg/min.  Vancomycin and Zosyn were started.  EKG showed atrial fibrillation with right bundle branch block.  I had a goals of care discussion with the patient's niece who is her POA.  She confirms the patient is DNR status.  she wants to continue aggressive care at least for the next 24 hours.  I requested Dr. Sabra Heck the place central line.  BiPAP was ordered.  11/18/21--patient is obtunded, minimally responsive to protopathic stimuli--occasional withdraw.  Levophed  up 5 mcg>>54mg.  Remains on BiPAP with no improvement on ABG Discussed GOC with niece.  Niece felt patient was awake and conversant and recognized her last night.  She states pt was able to communicate her needs.  This was not corroborated by nursing staff.  Niece wants to continue care and will visit patient this evening to confirm my findings of clinical worsening.  Nephrology consulted.  PCCM following.  Palliative saw patient.  11/19/21--discussed with Dr. SHalford Chessmanand Ms. Dalton on 10/6 and Ms.Dalton again on 10/7.  Agreed to take pt off BiPAP with no plan to resume it --patient is confused, waxing and waning out of consciousness. Remains on levophed --discussed with Ms. Dalton that patient is gradually and continuing declining medically and continuing medications with curative intent (antibiotics) and vasopressor will prolong patient's suffering.  Ms DKirk Ruthsagreed that pt is suffering and did not want to prolong the process.  She wanted patient to be comfortable and allow "the Lord to take her when it's time".  She agreed for antibiotic discontinuation.   11/20/21--patient awake and alert and conversant.  Stable on 3L [place].  Discussed with POA [name], who request patient be placed back on dialysis.  Now that patient is "stable", [name] requested blood work be restarted and patient be restarted on appropriate medications.  Discussed case with nephrology, Dr. SGlory Rosebushimmediate need for HD presently, but will reconsult in am.  Loklema ordered  11/21/21--pt awake and conversant.  Intermittently confused but pleasant.  Stable on 3L.  Nephrology re-consulted with plans for HD today.  Continue antibiotics and midodrine.  Continue full care outside of  DNR per POA.   Assessment/Plan: Septic shock -likely due to  pneumonia -presented with hypothermia, hypotension, altered mental status -lactic acid 1.0 -PCT0.74>>0.80>>1.00 -restart empiric vanc/zosyn (vanc dosed on 10/5 and 10/9) -follow blood  culture>>neg -levophed continues per family request>>weaned off 10/8 -restart midodrine per POA request   Acute on chronic respiratory failure with hypoxia and hypercapnia ([place]) Personally reviewed CXR--increased interstitial markings; bilateral perihilar opacities remain off BiPAP per patient and family request Chronic on 3-4 L 10/5 ABG 7.1/96/190/29 (1.0) 10/6 ABG 7.15/85/95/29 (0.4) Appreciate PCCM -10/9--back on her baseline 3L   Lobar Pneumonia -discontinue on zosyn, vanc and we are transitioning to a focus of comfort measures -11/20/21  POA [name] wanted to restart medications/abx   Acute metabolic Encephalopathy -patient is obtunded, less responsive than 11/25/2021 -occasional withdraw to protopathic stimuli initially -10/8-- more awake and conversant -10/9--awake and conversant, confused--combative during dialysis -10/10-remains confused   ESRD on dialysis [facility]) Schedule--Tuesday Thursday Saturday.  -last full HD session 10/3  Reports compliance with her HD sessions. -discussed with nephrology initially, Dr. [place] Bali is not dialysis or CRRT candidate -10/8  POA wanted to restart HD.  Discussed with Dr. Glory Rosebush immediate need for HD presently, will reconsult in am -10/9 plan for HD today per renal--only tolerated 1 hr 6 min   Acute on chronic combined systolic and diastolic CHF (congestive heart failure) ([place]) - Manage volume with HD -06/03/21 Echo EF 50%, RV overload, severe TR -08/02/20 Echo EF 45-50% HK inferoseptal, inferolateral, Inf wall; mod TR --10/24/21 Echo-EF 40-45%, global HK, RV overload, PASP 57.5 --clinically fluid overloaded presently --hold metoprolol succinate due to hypotension   Dysphagia -evaluated by speech last admission -continue dys 2 diet with thin liquids last admission -restart diet now that patient is more awake   Persistent atrial fibrillation ([place]) On anticoagulation with Eliquis. Rate Controlled, heart rate 50s to 90s.  Not on  rate limiting agents. -transitioned to [facility] lovenox initially due to inability to take po - Resumed Eliquis   Essential hypertension Previously on metoprolol>>dose was decreased to 12.5 mg daily Off metoprolol due to hypotension   Thrombocytopenia (HCC) Fairly chronic dating back to Feb 2023 -B12--1435 -folate-7.0 -TSH--1.132 -acute worsening due to sepsis   PAD (peripheral artery disease) (HCC) - Continue statin and ASA when able to tolerate po     Goals of care, counseling/discussion Discussed with the patient's POA, [name] last admission 10/23/21 Discussed GOC again with [name] 10/5>>confirmed DNR -- I discussed the patient's overall poor prognosis given her frailty, multiple comorbid conditions, numerous hospitalizations and poor functional baseline -10/6 discussed again with niece [name]--updated her that patient is clinically worse than 10/5>>she wishes to continue full care for now - 10/7 discussed with Ms. Dalton--see above -11/20/21--patient awake and alert and conversant.  Stable on 3L.  Discussed with POA [name], who request patient be placed back on dialysis.  Now that patient is "stable", [name] requested blood work be restarted and patient be restarted on appropriate medications.   -11/21/21--continue care outside of DNR     Family Communication:   Niece  updated 10/9   Consultants:  renal, PCCM, palliative   Code Status:  DNR   DVT Prophylaxis:  [place] Lovenox     Procedures: As Listed in Progress Note Above   Antibiotics: Vanc 10/5>.10/7---restart 10/8>> Zosy 10/5>>10/7--restart 10/8>>           Subjective: Patient denies fevers, chills, headache, chest pain, dyspnea, nausea, vomiting, diarrhea, abdominal pain,   Objective: Vitals:  11/22/21 0535 11/22/21 0842 11/22/21 1033 11/22/21 1238  BP: 117/76 (!) 133/91  111/80  Pulse: 68 74  69  Resp: '20 20  20  '$ Temp: 98.2 F (36.8 C) (!) 100.7 F (38.2 C) 99.2 F (37.3 C) 98.2 F (36.8  C)  TempSrc:  Axillary Axillary Axillary  SpO2: 100% 96%  98%  Weight:      Height:        Intake/Output Summary (Last 24 hours) at 11/22/2021 1753 Last data filed at 11/22/2021 1501 Gross per 24 hour  Intake 382.65 ml  Output 1.5 ml  Net 381.15 ml   Weight change: 0.4 kg Exam:  General:  Pt is alert, does not follow commands, not in acute distress HEENT: No icterus, No thrush, No neck mass, [place]/AT Cardiovascular: RRR, S1/S2, no rubs, no gallops Respiratory: bilateral crackles. No wheeze Abdomen: Soft/+BS, non tender, non distended, no guarding Extremities: 2 + LE edema, No lymphangitis, No petechiae, No rashes, no synovitis   Data Reviewed: I have personally reviewed following labs and imaging studies Basic Metabolic Panel: Recent Labs  Lab 11/14/2021 1115 11/18/21 0729 11/19/21 0415 11/20/21 1328 11/21/21 1937  NA 132* 132* 132* 129* 126*  K 4.5 5.0 5.1 5.5* 5.3*  CL 94* 93* 92* 91* 91*  CO2 '29 28 26 23 '$ 18*  GLUCOSE 170* 167* 124* 118* 74  BUN 44* 48* 54* 61* 71*  CREATININE 3.30* 3.65* 3.89* 4.61* 5.03*  CALCIUM 8.9 9.2 9.3 9.3 9.0  PHOS  --   --   --   --  7.3*   Liver Function Tests: Recent Labs  Lab 11/22/2021 1115 11/18/21 0729 11/19/21 0415 11/21/21 1937  AST '20 20 17  '$ --   ALT '19 21 19  '$ --   ALKPHOS 148* 130* 106  --   BILITOT 1.1 1.4* 1.7*  --   PROT 5.9* 6.1* 5.9*  --   ALBUMIN 3.2* 3.4* 3.3* 3.8   No results for input(s): "LIPASE", "AMYLASE" in the last 168 hours. No results for input(s): "AMMONIA" in the last 168 hours. Coagulation Profile: Recent Labs  Lab 11/20/2021 1115  INR 1.3*   CBC: Recent Labs  Lab 11/28/2021 1115 11/18/21 0729 11/19/21 0415 11/20/21 1328 11/21/21 1937  WBC 3.9* 7.4 4.5 3.0* 4.3  NEUTROABS 3.0  --   --   --   --   HGB 10.3* 10.3* 9.9* 9.2* 8.8*  HCT 36.0 35.5* 33.7* 30.4* 28.6*  MCV 112.9* 110.9* 109.8* 106.3* 104.0*  PLT 87* 130* 107* 86* 127*   Cardiac Enzymes: No results for input(s): "CKTOTAL",  "CKMB", "CKMBINDEX", "TROPONINI" in the last 168 hours. BNP: Invalid input(s): "POCBNP" CBG: Recent Labs  Lab 11/28/2021 1135  GLUCAP 162*   HbA1C: No results for input(s): "HGBA1C" in the last 72 hours. Urine analysis:    Component Value Date/Time   COLORURINE YELLOW 05/27/2014 1902   APPEARANCEUR CLEAR 05/27/2014 1902   LABSPEC 1.012 05/27/2014 1902   PHURINE 5.0 05/27/2014 1902   GLUCOSEU NEGATIVE 05/27/2014 1902   HGBUR NEGATIVE 05/27/2014 1902   BILIRUBINUR NEGATIVE 05/27/2014 1902   KETONESUR NEGATIVE 05/27/2014 1902   PROTEINUR 100 (A) 05/27/2014 1902   UROBILINOGEN 0.2 05/27/2014 1902   NITRITE NEGATIVE 05/27/2014 1902   LEUKOCYTESUR NEGATIVE 05/27/2014 1902   Sepsis Labs: '@LABRCNTIP'$ (procalcitonin:4,lacticidven:4) ) Recent Results (from the past 240 hour(s))  Culture, blood (Routine X 2) w Reflex to ID Panel     Status: None   Collection Time: 12/11/2021  3:58 PM   Specimen: BLOOD  RIGHT WRIST  Result Value Ref Range Status   Specimen Description BLOOD RIGHT WRIST  Final   Special Requests   Final    BOTTLES DRAWN AEROBIC AND ANAEROBIC Blood Culture adequate volume   Culture   Final    NO GROWTH 5 DAYS Performed at [facility], [address], [place], [place] [address]    Report Status 11/22/2021 FINAL  Final  Culture, blood (Routine X 2) w Reflex to ID Panel     Status: None   Collection Time: 12/03/2021  4:14 PM   Specimen: BLOOD RIGHT HAND  Result Value Ref Range Status   Specimen Description BLOOD RIGHT HAND  Final   Special Requests   Final    BOTTLES DRAWN AEROBIC AND ANAEROBIC Blood Culture adequate volume   Culture   Final    NO GROWTH 5 DAYS Performed at [facility], [address], [place], [place] [address]    Report Status 11/22/2021 FINAL  Final  MRSA Next Gen by PCR, Nasal     Status: None   Collection Time: 11/21/21 11:40 AM   Specimen: Nasal Mucosa; Nasal Swab  Result Value Ref Range Status   MRSA by PCR Next Gen NOT DETECTED NOT DETECTED  Final    Comment: (NOTE) The GeneXpert MRSA Assay (FDA approved for NASAL specimens only), is one component of a comprehensive MRSA colonization surveillance program. It is not intended to diagnose MRSA infection nor to guide or monitor treatment for MRSA infections. Test performance is not FDA approved in patients less than 19 years old. Performed at [facility], [address], [place], [place] [address]      Scheduled Meds:  apixaban  2.5 mg Oral BID   calcitRIOL  0.5 mcg Oral Q M,W,F-HD   Chlorhexidine Gluconate Cloth  6 each Topical Daily   Chlorhexidine Gluconate Cloth  6 each Topical Q0600   cinacalcet  30 mg Oral Q M,W,F-HD   darbepoetin (ARANESP) injection - DIALYSIS  100 mcg Intravenous Q Mon-HD   midodrine  10 mg Oral TID WC   mouth rinse  15 mL Mouth Rinse 4 times per day   Continuous Infusions:  norepinephrine (LEVOPHED) Adult infusion Stopped (11/20/21 0857)   piperacillin-tazobactam (ZOSYN) 2.25 g in sodium chloride 0.9 % 50 mL IVPB 100 mL/hr at 11/22/21 1501   vancomycin 1,000 mg (11/21/21 2131)    Procedures/Studies: CT HEAD WO CONTRAST  Result Date: 11/30/2021 CLINICAL DATA:  An 81 year old female presents with mental status changes of unknown cause. EXAM: CT HEAD WITHOUT CONTRAST TECHNIQUE: Contiguous axial images were obtained from the base of the skull through the vertex without intravenous contrast. RADIATION DOSE REDUCTION: This exam was performed according to the departmental dose-optimization program which includes automated exposure control, adjustment of the mA and/or kV according to patient size and/or use of iterative reconstruction technique. COMPARISON:  June 04, 2021. FINDINGS: Brain: No evidence of acute infarction, hemorrhage, hydrocephalus, extra-axial collection or mass lesion/mass effect. Signs of prior RIGHT parietal-occipital infarct with encephalomalacia. Signs of cerebral atrophy. Vascular: No hyperdense vessel or unexpected calcification.  Skull: Normal. Negative for fracture or focal lesion. LEFT mastoid and middle ear effusion. All mastoid air cells are opacified. No stranding adjacent to mastoid tip per signs of bony destruction. Signs of renal osteodystrophy throughout the calvarium Sinuses/Orbits: No acute finding. Other: None IMPRESSION: 1. No acute intracranial abnormality. 2. Signs of atrophy, chronic microvascular ischemic change in prior infarct with encephalomalacia. 3. LEFT mastoid and middle ear effusion. No stranding adjacent  to mastoid tip or signs of bony destruction. Correlate with any signs of otitis media and any clinical signs to suggest early mastoiditis. 4. Signs of renal osteodystrophy throughout the calvarium. Electronically Signed   By: Zetta Bills M.D.   On: 11/30/2021 15:01   DG Chest Port 1 View  Result Date: 11/28/2021 CLINICAL DATA:  Provided history: Altered mental status. EXAM: PORTABLE CHEST 1 VIEW COMPARISON:  Prior chest radiographs 11/05/2021. FINDINGS: Shows for age radiograph. Cardiomegaly. Opacities within the perihilar lungs bilaterally, and left lung base. Mild atelectasis within the right lung base. Suspected trace left pleural effusion. No evidence of pneumothorax. Degenerative changes of the spine. IMPRESSION: 1. Shallow inspiration radiograph. 2. Opacities within the perihilar lungs bilaterally, and left lung base. Findings may reflect atelectasis and/or pneumonia. 3. Minimal atelectasis within the right lung base. 4. Cardiomegaly. 5. Aortic Atherosclerosis (ICD10-I70.0). Electronically Signed   By: Kellie Simmering D.O.   On: 12/13/2021 12:14   DG Chest Portable 1 View  Result Date: 11/05/2021 CLINICAL DATA:  Hypoxia EXAM: PORTABLE CHEST 1 VIEW COMPARISON:  11/02/2021 chest radiograph. FINDINGS: Low lung volumes. Stable cardiomediastinal silhouette with mild cardiomegaly. No pneumothorax. No significant pleural effusions. No overt pulmonary edema. Patchy bibasilar lung opacities, similar.  IMPRESSION: 1. Stable cardiomegaly without overt pulmonary edema. 2. Low lung volumes with patchy bibasilar lung opacities, similar, favor atelectasis, with component of aspiration or pneumonia not excluded. Electronically Signed   By: Ilona Sorrel M.D.   On: 11/05/2021 17:53   DG CHEST PORT 1 VIEW  Result Date: 11/02/2021 CLINICAL DATA:  Respiratory distress EXAM: PORTABLE CHEST 1 VIEW COMPARISON:  10/27/2021 FINDINGS: Poor inspiration. Cardiomegaly and aortic atherosclerosis. Pulmonary venous hypertension with mild interstitial edema. Small effusions and lower lobe atelectasis. IMPRESSION: Findings most consistent with congestive heart failure. Cannot rule out coexistent pneumonia in the lower lobes. Similar appearance compared to the study of 09/14. There may be slightly worse volume loss at the left base. Electronically Signed   By: Nelson Chimes M.D.   On: 11/02/2021 13:36   DG Chest Port 1 View  Result Date: 10/27/2021 CLINICAL DATA:  Shortness of breath EXAM: PORTABLE CHEST 1 VIEW COMPARISON:  Radiographs 10/23/2021 FINDINGS: Stable cardiomegaly. Right basilar atelectasis/consolidation. Interstitial markings are prominent bilaterally likely due to low lung volumes and chronic interstitial lung disease. Question trace pleural effusions bilaterally are pleural thickening. No pneumothorax. Elevated right hemidiaphragm. No acute osseous abnormality. Aortic atherosclerotic calcification. IMPRESSION: Elevated right hemidiaphragm with associated presumed atelectasis though pneumonia is difficult to exclude. Hypoinflation and chronic interstitial prominence. Bilateral trace pleural effusions or pleural thickening. Cardiomegaly. Electronically Signed   By: Placido Sou M.D.   On: 10/27/2021 19:15    Orson Eva, DO  [facility]  If 7PM-7AM, please contact night-coverage [email or web address] Password [email or web address] 11/22/2021, 5:53 PM   LOS: 5 days

## 2021-11-22 NOTE — TOC Progression Note (Signed)
Transition of Care [facility]) - Progression Note    Patient Details  [name] MRN: [id number] Date of Birth: 1940-09-01  Transition of Care [facility]) CM/SW Contact  Boneta Lucks, RN Phone Number: 11/22/2021, 12:13 PM  Clinical Narrative:   Donella Stade updating Christy at [facility]. Patient will not be going back under Skilled not Auth needed. Currently patient is combative and did not do well with HD. She will not be able to do outpatient HD in this state. TOC to follow.     Expected Discharge Plan: Long Term Acute Care (LTAC) Barriers to Discharge: Continued Medical Work up  Expected Discharge Plan and Services Expected Discharge Plan: [place] (LTAC)              Readmission Risk Interventions    08/15/2021   11:49 AM 06/07/2021    2:47 PM 06/01/2021   10:59 AM  Readmission Risk Prevention Plan  Transportation Screening Complete Complete Complete  Medication Review Press photographer)  Complete Complete  PCP or Specialist appointment within 3-5 days of discharge  Complete   HRI or Home Care Consult Complete Complete Complete  SW Recovery Care/Counseling Consult Complete Complete Complete  Palliative Care Screening Not Applicable Complete Not Applicable  Skilled Nursing Facility Complete Complete Complete

## 2021-11-22 NOTE — Progress Notes (Signed)
Subjective:  attempted HD noted- patient could not cooperate-  would not follow cues-  I agree is a safety issue since she has an AVF-  sleeping this AM- when awoke still confused   Objective Vital signs in last 24 hours: Vitals:   11/21/21 2000 11/21/21 2056 11/22/21 0026 11/22/21 0535  BP: (!) 128/92 111/81 137/77 117/76  Pulse: 89 97 88 68  Resp: '18 19 19 20  '$ Temp: 98.1 F (36.7 C) 98 F (36.7 C) 98.6 F (37 C) 98.2 F (36.8 C)  TempSrc: Oral Oral    SpO2: 96% 97% (!) 71% 100%  Weight:      Height:       Weight change: 0.4 kg  Intake/Output Summary (Last 24 hours) at 11/22/2021 0745 Last data filed at 11/21/2021 2333 Gross per 24 hour  Intake 50 ml  Output 1.5 ml  Net 48.5 ml   OP HD orders:  [facility], TTS, 3 hours 15 minutes.  EDW 81 kg.  2K/2.5 calcium.  RUE AVF 400/500.  Meds: Mircera 200 mcg every 2 weeks, Venofer 50 mg q, calcitriol 0.5 mcg 3 times a week, Sensipar 30 mg 3 times a week.  No heparin (history of GI bleed)   Assessment/ Plan: Pt is a 81 y.o. yo female who was admitted on 11/27/2021 with hypotension /sepsis Assessment/Plan: 1. Sepsis-  thought due to PNA- has responded to abx and BP has normalized  2. ESRD - normally TTS-  last done not full treatment on 10/5-  then felt to be too unstable and did not do HD over weekend-  family now wished to resume-  attempted HD off schedule via AVF yesterday, it did not work out very well, pt could not cooperate, is a safety risk.  Most certainly could not be dialyzed at an OP center this way-   if her MS does not improve I do not think she is a candidate for continues dialysis  3. Anemia-  added back ESA 4. Secondary hyperparathyroidism- continue calcitriol , sensipar-  no binder yet  5. HTN/volume- developing hyponatremia argues for volume overload, also exam -  UF as able today -  on midodrine-  unable to tolerated much HD as above 6. Dispo-  did not do well with HD yesterday -  is still confused this AM-  would not  be feasible to do OP HD in the state that she is in -  will await further instructions from primary and palliative teams   Louis Meckel    Labs: Basic Metabolic Panel: Recent Labs  Lab 11/19/21 0415 11/20/21 1328 11/21/21 1937  NA 132* 129* 126*  K 5.1 5.5* 5.3*  CL 92* 91* 91*  CO2 26 23 18*  GLUCOSE 124* 118* 74  BUN 54* 61* 71*  CREATININE 3.89* 4.61* 5.03*  CALCIUM 9.3 9.3 9.0  PHOS  --   --  7.3*   Liver Function Tests: Recent Labs  Lab 11/23/2021 1115 11/18/21 0729 11/19/21 0415 11/21/21 1937  AST '20 20 17  '$ --   ALT '19 21 19  '$ --   ALKPHOS 148* 130* 106  --   BILITOT 1.1 1.4* 1.7*  --   PROT 5.9* 6.1* 5.9*  --   ALBUMIN 3.2* 3.4* 3.3* 3.8   No results for input(s): "LIPASE", "AMYLASE" in the last 168 hours. No results for input(s): "AMMONIA" in the last 168 hours. CBC: Recent Labs  Lab 12/10/2021 1115 11/18/21 0729 11/19/21 0415 11/20/21 1328 11/21/21 1937  WBC  3.9* 7.4 4.5 3.0* 4.3  NEUTROABS 3.0  --   --   --   --   HGB 10.3* 10.3* 9.9* 9.2* 8.8*  HCT 36.0 35.5* 33.7* 30.4* 28.6*  MCV 112.9* 110.9* 109.8* 106.3* 104.0*  PLT 87* 130* 107* 86* 127*   Cardiac Enzymes: No results for input(s): "CKTOTAL", "CKMB", "CKMBINDEX", "TROPONINI" in the last 168 hours. CBG: Recent Labs  Lab 12/10/2021 1135  GLUCAP 162*    Iron Studies: No results for input(s): "IRON", "TIBC", "TRANSFERRIN", "FERRITIN" in the last 72 hours. Studies/Results: No results found. Medications: Infusions:  norepinephrine (LEVOPHED) Adult infusion Stopped (11/20/21 0857)   piperacillin-tazobactam (ZOSYN) 2.25 g in sodium chloride 0.9 % 50 mL IVPB 2.25 g (11/22/21 0539)   vancomycin 1,000 mg (11/21/21 2131)    Scheduled Medications:  apixaban  2.5 mg Oral BID   calcitRIOL  0.5 mcg Oral Q M,W,F-HD   Chlorhexidine Gluconate Cloth  6 each Topical Daily   Chlorhexidine Gluconate Cloth  6 each Topical Q0600   cinacalcet  30 mg Oral Q M,W,F-HD   darbepoetin (ARANESP)  injection - DIALYSIS  100 mcg Intravenous Q Mon-HD   midodrine  10 mg Oral TID WC   mouth rinse  15 mL Mouth Rinse 4 times per day    have reviewed scheduled and prn medications.  Physical Exam: General: pt confused-  mittens on Heart: RRR Lungs: ant clear Abdomen: obese Extremities: pitting dep edema  Dialysis Access: right upper AVF-  patent  -  some bruising    11/22/2021,7:45 AM  LOS: 5 days

## 2021-11-22 NOTE — Progress Notes (Signed)
Patient had temperature of 100.7 axillary, patient refused to allow staff to check orally. MD aware. New order placed, patient given PRN Tylenol, see MAR. Re-checked temp was 99.2 axillary. Patient refused all meals this shift. Patient stated she did not want anything, attempted to push food and liquids away from face when staff was assisting to feed patient. Patient stated that staff was crazy and she was ready to leave. Nurse tech reported while attempting to feed patient dinner, patient stated we where going to make her vomit and spit food out. MD Tat made aware.

## 2021-11-23 DIAGNOSIS — N186 End stage renal disease: Secondary | ICD-10-CM | POA: Diagnosis not present

## 2021-11-23 DIAGNOSIS — A419 Sepsis, unspecified organism: Secondary | ICD-10-CM | POA: Diagnosis not present

## 2021-11-23 DIAGNOSIS — J9602 Acute respiratory failure with hypercapnia: Secondary | ICD-10-CM | POA: Diagnosis not present

## 2021-11-23 DIAGNOSIS — Z992 Dependence on renal dialysis: Secondary | ICD-10-CM | POA: Diagnosis not present

## 2021-11-23 DIAGNOSIS — E875 Hyperkalemia: Secondary | ICD-10-CM | POA: Diagnosis not present

## 2021-11-23 DIAGNOSIS — Z515 Encounter for palliative care: Secondary | ICD-10-CM | POA: Diagnosis not present

## 2021-11-23 DIAGNOSIS — Z7189 Other specified counseling: Secondary | ICD-10-CM | POA: Diagnosis not present

## 2021-11-23 DIAGNOSIS — I4819 Other persistent atrial fibrillation: Secondary | ICD-10-CM | POA: Diagnosis not present

## 2021-11-23 LAB — RENAL FUNCTION PANEL
Albumin: 3.3 g/dL — ABNORMAL LOW (ref 3.5–5.0)
Anion gap: 18 — ABNORMAL HIGH (ref 5–15)
BUN: 69 mg/dL — ABNORMAL HIGH (ref 8–23)
CO2: 20 mmol/L — ABNORMAL LOW (ref 22–32)
Calcium: 9.3 mg/dL (ref 8.9–10.3)
Chloride: 95 mmol/L — ABNORMAL LOW (ref 98–111)
Creatinine, Ser: 4.88 mg/dL — ABNORMAL HIGH (ref 0.44–1.00)
GFR, Estimated: 8 mL/min — ABNORMAL LOW (ref >60)
Glucose, Bld: 74 mg/dL (ref 70–99)
Phosphorus: 6.6 mg/dL — ABNORMAL HIGH (ref 2.5–4.6)
Potassium: 4.7 mmol/L (ref 3.5–5.1)
Sodium: 133 mmol/L — ABNORMAL LOW (ref 135–145)

## 2021-11-23 LAB — CBC
HCT: 27.9 % — ABNORMAL LOW (ref 36.0–46.0)
Hemoglobin: 8.8 g/dL — ABNORMAL LOW (ref 12.0–15.0)
MCH: 32.8 pg (ref 26.0–34.0)
MCHC: 31.5 g/dL (ref 30.0–36.0)
MCV: 104.1 fL — ABNORMAL HIGH (ref 80.0–100.0)
Platelets: 138 10*3/uL — ABNORMAL LOW (ref 150–400)
RBC: 2.68 MIL/uL — ABNORMAL LOW (ref 3.87–5.11)
RDW: 17.5 % — ABNORMAL HIGH (ref 11.5–15.5)
WBC: 5.4 10*3/uL (ref 4.0–10.5)
nRBC: 1.1 % — ABNORMAL HIGH (ref 0.0–0.2)

## 2021-11-23 MED ORDER — CINACALCET HCL 30 MG PO TABS
ORAL_TABLET | ORAL | Status: AC
  Filled 2021-11-23: qty 1

## 2021-11-23 MED ORDER — CALCITRIOL 0.25 MCG PO CAPS
ORAL_CAPSULE | ORAL | Status: AC
  Filled 2021-11-23: qty 2

## 2021-11-23 NOTE — Procedures (Addendum)
   HEMODIALYSIS TREATMENT NOTE:  Primary nurse reported pt refused Xanax prior to HD.  Pre-HD oriented to self, presented to [place] in bed with safety mitts on both hands.  Allowed taking of VS and cannulation.  Treatment started without problems.  She slept for the first hour then woke c/o back pain and asked to be repositioned.  She was turned several times, legs were flexed and extended, propped on pillows, removed from pillows.  She felt no relief and yelled, "I need help, G** dammit!"  Eventually she drifted back to sleep.    UF was limited by hypotension and goal was lowered as a result.  Dr. Moshe Cipro had asked we try to mirror outpatient HD where Albumin is not available.   Twenty minutes later she was alert, studying her hands.  She politely asked to have the mitts removed.  I did so and offered her Calcitriol and Sensipar, which she refused. Within 5 minutes she was picking at gauze around PIV site.  I re-applied right safety mitt while she clawed at and bit my left hand.  As ordered, HD was discontinued at this point due to combativeness.    All blood was returned.  She resisted needle removal; gauze bandages had to be changed due to prolonged bleeding.  She was returned to 3A for ongoing monitoring.  Bedside report given to Marianna Payment, LPN.  Total run time: 2 hours 40 minutes Net UF 1 liter   Rockwell Alexandria, RN

## 2021-11-23 NOTE — Progress Notes (Signed)
Palliative:  HPI: 81 y.o. female  with past medical history of stroke, ESRD on HD, COPD on 3L oxygen, CHF, atrial fib, PAD, recent left great toe osteomyelitis s/p amputation, anemia of chronic disease, diabetes admitted on 12/10/2021 with altered mental status related to septic shock likely pneumonia with acute on chronic respiratory failure.   I met today with Jody [name] and she is sleeping. Breakfast is at bedside but only a few bites missing from plate. I gently rubbed her arm and asked her how she was. She replied in mumbled voice without opening her eyes "don't do that." I attempted to ask what she didn't want me to do but she just repeated herself. She would not open her eyes.   I followed up again with Jody [name] as she was completing dialysis. Unable to complete dialysis session due to combative behaviors making dialysis unsafe even with bilateral mittens on. She struggled to tolerate even with albumin given. She will not be able to proceed with outpatient dialysis under these conditions. She continues to be combative to nursing staff and spits out food and pills.   I called and spoke with Jody [name]. I shared with Jody [name] her aunt's condition and responses to staff. Jody [name] understands the dilemma and I share that sometimes this can be a patient's way of letting us know that they cannot take anymore - they just want to be left alone to rest. Jody [name] understands that she cannot continue dialysis in this condition. Jody [name] wishes to come assess Jody [name] herself. I did prompt Jody [name] that I do anticipate she will likely be more cooperative for Jody [name] as she is familiar and family but unless family is able to be with her 24/7 to help her cooperate we are still limited on what we can do to progress her condition. Jody [name] understands that we may be at the place of consideration to stop dialysis and focus on comfort care. We discussed prognosis without dialysis would be days to a week or two. I will follow up with  Jody [name] tomorrow after her visit this evening.   All questions/concerns addressed. Emotional support provided. Updated Dr. Dyann Kief, Dr. Candiss Norse, and RN.   Exam: Limited response to me. Uncooperative. No distress. Mitts on hands. Breathing regular, unlabored. Abd soft. BLE edema.   Plan: - Follow up conversation with [facility]CPOA tomorrow to clarify goals of care and path forward.   [place], NP Palliative Medicine Team Pager [phone] (Please see [email or web address] for schedule) Team Phone [phone]    Greater than 50%  of this time was spent counseling and coordinating care related to the above assessment and plan

## 2021-11-23 NOTE — Progress Notes (Signed)
Pts family was able to feed pt and keep her calm. Pt ate 75% of dinner. Pt has not been agitated with family present.

## 2021-11-23 NOTE — Progress Notes (Signed)
Pt has arrived from dialysis and family is bedside. Pts family has encouraged pt to eat some icecream.

## 2021-11-23 NOTE — Progress Notes (Addendum)
PROGRESS NOTE  Javanna Patin HYW:[id number] DOB: [birth year] DOA: 11/23/2021 PCP: Monico Blitz, MD  Brief History:  81 year old female with history of stroke, COPD, chronic respiratory failure on 3 L, systolic and diastolic CHF, persistent atrial fibrillation, ESRD, iron deficiency presenting from her nursing facility [facility]) from which she is a permanent resident secondary to altered mental status and hypotension.  Patient was at HD the morning of admission.  After 25 min of HD, patient was hypotensive and somnolent.  Pt unable to provide history due to acute encephalopathy.   At baseline, patient is conversant although pleasantly confused occasionally.  She is bedbound at baseline.  Apparently, the patient was was her usual self prior to going to dialysis on the morning of 11/22/2021. The patient was most recently admitted to the hospital from 10/27/2021 to 11/03/21 for acute on chronic respiratory failure which was thought to be multifactorial secondary to pneumonia, atrial fibrillation with RVR, fluid overload.  The patient was discharged back to her facility with 3 more days of cefdinir and doxycycline. In the ED, the patient was hypothermic with a temperature of 93.7.  She was hypotensive initially with blood pressure 57/43.  The patient was placed on nonrebreather with saturation 100%.  Chest x-ray showed bilateral perihilar opacities.  ABG showed seven-point 1/96/190/29 on 100%.  The patient was started on Levophed 5 mcg/kg/min.  Vancomycin and Zosyn were started.  EKG showed atrial fibrillation with right bundle branch block.  I had a goals of care discussion with the patient's niece who is her POA.  She confirms the patient is DNR status.  she wants to continue aggressive care at least for the next 24 hours.  I requested Dr. Sabra Heck the place central line.  BiPAP was ordered.  11/18/21--patient is obtunded, minimally responsive to protopathic stimuli--occasional withdraw.  Levophed  up 5 mcg>>3mg.  Remains on BiPAP with no improvement on ABG Discussed GOC with niece.  Niece felt patient was awake and conversant and recognized her last night.  She states pt was able to communicate her needs.  This was not corroborated by nursing staff.  Niece wants to continue care and will visit patient this evening to confirm my findings of clinical worsening.  Nephrology consulted.  PCCM following.  Palliative saw patient.  11/19/21--discussed with Dr. SHalford Chessmanand Ms. Dalton on 10/6 and Ms.Dalton again on 10/7.  Agreed to take pt off BiPAP with no plan to resume it --patient is confused, waxing and waning out of consciousness. Remains on levophed --discussed with Ms. Dalton that patient is gradually and continuing declining medically and continuing medications with curative intent (antibiotics) and vasopressor will prolong patient's suffering.  Ms DKirk Ruthsagreed that pt is suffering and did not want to prolong the process.  She wanted patient to be comfortable and allow "the Lord to take her when it's time".  She agreed for antibiotic discontinuation.   11/20/21--patient awake and alert and conversant.  Stable on 3L [place].  Discussed with POA [name], who request patient be placed back on dialysis.  Now that patient is "stable", [name] requested blood work be restarted and patient be restarted on appropriate medications.  Discussed case with nephrology, Dr. SGlory Rosebushimmediate need for HD presently, but will reconsult in am.  Loklema ordered  11/21/21--pt awake and conversant.  Intermittently confused but pleasant.  Stable on 3L.  Nephrology re-consulted with plans for HD today.  Continue antibiotics and midodrine.  Continue full care outside of  DNR per POA.  11/22/21--pt could only tolerate 1 hour 6 min of dialysis on 10/9 due to agitation/confusion>>would not be able to return to outpt HD center if cannot be 100% cooperative.  Stable on 3-4 L [place].  Remains confused.  Reconsulted palliative.  Plan for Xanax  on HD 11/23/21 to see if pt can be more cooperative.  11/23/21>> patient refused medications and signed Unable to be provided.  After 2 hours and 40 minutes of treatment she ended scratching and biting dialysis nurse.  Patient is afebrile, no nausea, no vomiting, no chest pain.  Unlikely that she will be able to receive dialysis in the outpatient setting with ongoing behavioral disturbances.   Assessment/Plan: Septic shock -likely due to  pneumonia -presented with hypothermia, hypotension, altered mental status -lactic acid 1.0 -PCT0.74>>0.80>>1.00 -restart empiric vanc/zosyn (vanc dosed on 10/5 and 10/9) -follow blood culture>>neg -levophed continues per family request>>weaned off 10/8 -restart midodrine per POA request   Acute on chronic respiratory failure with hypoxia and hypercapnia ([place]) Personally reviewed CXR--increased interstitial markings; bilateral perihilar opacities remain off BiPAP per patient and family request Chronic on 3-4 L 10/5 ABG 7.1/96/190/29 (83.0) 10/6 ABG 7.15/85/95/29 (0.4) Appreciate PCCM -10/9--back on her baseline 3L   [place] Pneumonia -discontinue on zosyn, vanc and we are transitioning to a focus of comfort measures -11/20/21  POA [name] wanted to restart medications/abx -At this moment antibiotic therapy has been completed. -No fever; good saturation on chronic 3 L nasal cannula supplementation.   Acute metabolic Encephalopathy -patient is obtunded, less responsive than 83/05/2021 -occasional withdraw to protopathic stimuli initially -10/8-- more awake and conversant -10/9--awake and conversant, confused--combative during dialysis -10/10-remains confused -10/11>> patient remain oriented x1 only; having difficulty following commands and is refusing medications.   ESRD on dialysis [facility]) Schedule--Tuesday Thursday Saturday.  -last full HD session 10/3  Reports compliance with her HD sessions. -discussed with nephrology initially, Dr. West Bali  is not dialysis or CRRT candidate -10/8  POA wanted to restart HD.  Discussed with Dr. Glory Rosebush immediate need for HD presently, will reconsult in am -10/9 status post hemodialysis as per renal service recommendations; only tolerated 1 hr 6 min -10/11 patient tolerated 2 hours and 40 minutes, patient treatment terminated due to combativeness after she ended scratching and biting dialysis nurse. -She refused calcitriol and Sensipar as ordered with dialysis. -Ultrafiltration 1 L.   Acute on chronic combined systolic and diastolic CHF (congestive heart failure) (HCC) - Manage volume with HD -06/03/21 Echo EF 50%, RV overload, severe TR -08/02/20 Echo EF 45-50% HK inferoseptal, inferolateral, Inf wall; mod TR --10/24/21 Echo-EF 40-45%, global HK, RV overload, PASP 57.5 --clinically fluid overloaded presently --hold metoprolol succinate due to hypotension -Volume management with hemodialysis.   Dysphagia -evaluated by speech last admission -continue dys 2 diet with thin liquids last admission -Diet order has been resumed; patient declining food and meds.   Persistent atrial fibrillation (HCC) On anticoagulation with Eliquis.  -Rate Controlled, heart rate 50s to 90s.  Not on rate limiting agents. -transitioned to [facility] lovenox initially due to inability to take po - Resumed on Eliquis; but patient is refusing medications.   Essential hypertension Previously on metoprolol>>dose was decreased to 12.5 mg daily; and now she is off metoprolol due to hypotension.   Thrombocytopenia ([place]) Fairly chronic dating back to Feb 2023 -B12--1435 -folate-7.0 -TSH--1.132 -acute worsening due to sepsis -No overt bleeding appreciated.   PAD (peripheral artery disease) (HCC) - Continue statin and ASA when able to tolerate po -Currently refusing  meds.   Goals of care, counseling/discussion Discussed with the patient's POA, [name] last admission 10/23/21 Discussed GOC again with [name]  10/5>>confirmed DNR -- I discussed the patient's overall poor prognosis given her frailty, multiple comorbid conditions, numerous hospitalizations and poor functional baseline -10/6 discussed again with niece [name]--updated her that patient is clinically worse than 10/5>>she wishes to continue full care for now - 10/7 discussed with Ms. Dalton--see above -11/20/21--patient awake and alert and conversant.  Stable on 3L.  Discussed with POA [name], who request patient be placed back on dialysis.  Now that patient is "stable", [name] requested blood work be restarted and patient be restarted on appropriate medications; patient is refusing meds. -11/21/21--continue care outside of DNR -10/11>> patient declined medications to assist with mood and agitation; able to tolerate 2 hours of dialysis only net ultrafiltration 1 L.     Family Communication:   No family at bedside.   Consultants:  renal, PCCM, palliative   Code Status:  DNR   DVT Prophylaxis:  [place] Lovenox     Procedures: As Listed in Progress Note Above   Antibiotics: Vanc 10/5>.10/7---restart 10/8>> Zosy 10/5>>10/7--restart 10/8>>    Subjective: Afebrile, no chest pain, no nausea, no vomiting.  Reports pain in her back sites and legs.  Per nursing staff refusing medications, food and experiencing intermittent episode of agitation.  Oriented x1 only.   Objective: Vitals:   11/23/21 1430 11/23/21 1500 11/23/21 1515 11/23/21 1545  BP: (!) 98/55 90/70 109/81 115/82  Pulse: 89 98 91 98  Resp: '15 16 18 20  '$ Temp:      TempSrc:    Axillary  SpO2:    96%  Weight:      Height:        Intake/Output Summary (Last 24 hours) at 11/23/2021 2045 Last data filed at 11/23/2021 1700 Gross per 24 hour  Intake 460 ml  Output 1 ml  Net 459 ml   Weight change:   Exam: General exam: Alert, awake, oriented x 1 only; reporting pain in her back sides and legs; no nausea, no vomiting, no fever.  Refusing medications and food this  morning. Respiratory system: Decreased breath sounds at the bases; positive rhonchi.  2-3 L second supplementation in place.  No using accessory muscles Cardiovascular system:RRR. No rubs or gallops; no JVD appreciated on exam. Gastrointestinal system: Abdomen is nondistended, soft and nontender. No organomegaly or masses felt. Normal bowel sounds heard. Central nervous system: No focal neurological deficits. Extremities: No cyanosis or clubbing.  1-2+ edema appreciated bilaterally. Skin: No petechiae. Psychiatry: Judgement and insight appear impaired on examination; nursing staff reporting intermittent episode of agitation.  Data Reviewed: I have personally reviewed following labs and imaging studies  Basic Metabolic Panel: Recent Labs  Lab 11/18/21 0729 11/19/21 0415 11/20/21 1328 11/21/21 1937 11/23/21 1328  NA 132* 132* 129* 126* 133*  K 5.0 5.1 5.5* 5.3* 4.7  CL 93* 92* 91* 91* 95*  CO2 '28 26 23 '$ 18* 20*  GLUCOSE 167* 124* 118* 74 74  BUN 48* 54* 61* 71* 69*  CREATININE 3.65* 3.89* 4.61* 5.03* 4.88*  CALCIUM 9.2 9.3 9.3 9.0 9.3  PHOS  --   --   --  7.3* 6.6*   Liver Function Tests: Recent Labs  Lab 12/05/2021 1115 11/18/21 0729 11/19/21 0415 11/21/21 1937 11/23/21 1328  AST '20 20 17  '$ --   --   ALT '19 21 19  '$ --   --   ALKPHOS 148* 130*  106  --   --   BILITOT 1.1 1.4* 1.7*  --   --   PROT 5.9* 6.1* 5.9*  --   --   ALBUMIN 3.2* 3.4* 3.3* 3.8 3.3*   Coagulation Profile: Recent Labs  Lab 12/02/2021 1115  INR 1.3*   CBC: Recent Labs  Lab 11/21/2021 1115 11/18/21 0729 11/19/21 0415 11/20/21 1328 11/21/21 1937 11/23/21 1328  WBC 3.9* 7.4 4.5 3.0* 4.3 5.4  NEUTROABS 3.0  --   --   --   --   --   HGB 10.3* 10.3* 9.9* 9.2* 8.8* 8.8*  HCT 36.0 35.5* 33.7* 30.4* 28.6* 27.9*  MCV 112.9* 110.9* 109.8* 106.3* 104.0* 104.1*  PLT 87* 130* 107* 86* 127* 138*    CBG: Recent Labs  Lab 12/04/2021 1135  GLUCAP 162*   HbA1C: No results for input(s): "HGBA1C" in the last  72 hours. Urine analysis:    Component Value Date/Time   COLORURINE YELLOW 05/27/2014 1902   APPEARANCEUR CLEAR 05/27/2014 1902   LABSPEC 1.012 05/27/2014 1902   PHURINE 5.0 05/27/2014 1902   GLUCOSEU NEGATIVE 05/27/2014 1902   HGBUR NEGATIVE 05/27/2014 1902   BILIRUBINUR NEGATIVE 05/27/2014 1902   KETONESUR NEGATIVE 05/27/2014 1902   PROTEINUR 100 (A) 05/27/2014 1902   UROBILINOGEN 0.2 05/27/2014 1902   NITRITE NEGATIVE 05/27/2014 1902   LEUKOCYTESUR NEGATIVE 05/27/2014 1902   Sepsis Labs: '@LABRCNTIP'$ (procalcitonin:4,lacticidven:4) ) Recent Results (from the past 240 hour(s))  Culture, blood (Routine X 2) w Reflex to ID Panel     Status: None   Collection Time: 11/21/2021  3:58 PM   Specimen: BLOOD RIGHT WRIST  Result Value Ref Range Status   Specimen Description BLOOD RIGHT WRIST  Final   Special Requests   Final    BOTTLES DRAWN AEROBIC AND ANAEROBIC Blood Culture adequate volume   Culture   Final    NO GROWTH 5 DAYS Performed at [facility], [address], [place], [place] [address]    Report Status 11/22/2021 FINAL  Final  Culture, blood (Routine X 2) w Reflex to ID Panel     Status: None   Collection Time: 11/20/2021  4:14 PM   Specimen: BLOOD RIGHT HAND  Result Value Ref Range Status   Specimen Description BLOOD RIGHT HAND  Final   Special Requests   Final    BOTTLES DRAWN AEROBIC AND ANAEROBIC Blood Culture adequate volume   Culture   Final    NO GROWTH 5 DAYS Performed at [facility], [address] [place], [place] [address]    Report Status 11/22/2021 FINAL  Final  MRSA Next Gen by PCR, Nasal     Status: None   Collection Time: 11/21/21 11:40 AM   Specimen: Nasal Mucosa; Nasal Swab  Result Value Ref Range Status   MRSA by PCR Next Gen NOT DETECTED NOT DETECTED Final    Comment: (NOTE) The GeneXpert MRSA Assay (FDA approved for NASAL specimens only), is one component of a comprehensive MRSA colonization surveillance program. It is not intended to diagnose  MRSA infection nor to guide or monitor treatment for MRSA infections. Test performance is not FDA approved in patients less than 28 years old. Performed at [facility], [address] [place], [place] [address]      Scheduled Meds:  ALPRAZolam  0.5 mg Oral Q M,W,F-HD   apixaban  2.5 mg Oral BID   calcitRIOL  0.5 mcg Oral Q M,W,F-HD   Chlorhexidine Gluconate Cloth  6 each Topical Daily   Chlorhexidine  Gluconate Cloth  6 each Topical Q0600   cinacalcet       cinacalcet  30 mg Oral Q M,W,F-HD   darbepoetin (ARANESP) injection - DIALYSIS  100 mcg Intravenous Q Mon-HD   midodrine  10 mg Oral TID WC   mouth rinse  15 mL Mouth Rinse 4 times per day   Continuous Infusions:  norepinephrine (LEVOPHED) Adult infusion Stopped (11/20/21 0857)   piperacillin-tazobactam (ZOSYN) 2.25 g in sodium chloride 0.9 % 50 mL IVPB 2.25 g (11/23/21 1625)    Procedures/Studies: CT HEAD WO CONTRAST  Result Date: 12/05/2021 CLINICAL DATA:  An 81 year old female presents with mental status changes of unknown cause. EXAM: CT HEAD WITHOUT CONTRAST TECHNIQUE: Contiguous axial images were obtained from the base of the skull through the vertex without intravenous contrast. RADIATION DOSE REDUCTION: This exam was performed according to the departmental dose-optimization program which includes automated exposure control, adjustment of the mA and/or kV according to patient size and/or use of iterative reconstruction technique. COMPARISON:  June 04, 2021. FINDINGS: Brain: No evidence of acute infarction, hemorrhage, hydrocephalus, extra-axial collection or mass lesion/mass effect. Signs of prior RIGHT parietal-occipital infarct with encephalomalacia. Signs of cerebral atrophy. Vascular: No hyperdense vessel or unexpected calcification. Skull: Normal. Negative for fracture or focal lesion. LEFT mastoid and middle ear effusion. All mastoid air cells are opacified. No stranding adjacent to mastoid tip per signs of bony  destruction. Signs of renal osteodystrophy throughout the calvarium Sinuses/Orbits: No acute finding. Other: None IMPRESSION: 1. No acute intracranial abnormality. 2. Signs of atrophy, chronic microvascular ischemic change in prior infarct with encephalomalacia. 3. LEFT mastoid and middle ear effusion. No stranding adjacent to mastoid tip or signs of bony destruction. Correlate with any signs of otitis media and any clinical signs to suggest early mastoiditis. 4. Signs of renal osteodystrophy throughout the calvarium. Electronically Signed   By: Zetta Bills M.D.   On: 12/09/2021 15:01   DG Chest Port 1 View  Result Date: 12/06/2021 CLINICAL DATA:  Provided history: Altered mental status. EXAM: PORTABLE CHEST 1 VIEW COMPARISON:  Prior chest radiographs 11/05/2021. FINDINGS: Shows for age radiograph. Cardiomegaly. Opacities within the perihilar lungs bilaterally, and left lung base. Mild atelectasis within the right lung base. Suspected trace left pleural effusion. No evidence of pneumothorax. Degenerative changes of the spine. IMPRESSION: 1. Shallow inspiration radiograph. 2. Opacities within the perihilar lungs bilaterally, and left lung base. Findings may reflect atelectasis and/or pneumonia. 3. Minimal atelectasis within the right lung base. 4. Cardiomegaly. 5. Aortic Atherosclerosis (ICD10-I70.0). Electronically Signed   By: Kellie Simmering D.O.   On: 12/05/2021 12:14   DG Chest Portable 1 View  Result Date: 11/05/2021 CLINICAL DATA:  Hypoxia EXAM: PORTABLE CHEST 1 VIEW COMPARISON:  11/02/2021 chest radiograph. FINDINGS: Low lung volumes. Stable cardiomediastinal silhouette with mild cardiomegaly. No pneumothorax. No significant pleural effusions. No overt pulmonary edema. Patchy bibasilar lung opacities, similar. IMPRESSION: 1. Stable cardiomegaly without overt pulmonary edema. 2. Low lung volumes with patchy bibasilar lung opacities, similar, favor atelectasis, with component of aspiration or pneumonia  not excluded. Electronically Signed   By: Ilona Sorrel M.D.   On: 11/05/2021 17:53   DG CHEST PORT 1 VIEW  Result Date: 11/02/2021 CLINICAL DATA:  Respiratory distress EXAM: PORTABLE CHEST 1 VIEW COMPARISON:  10/27/2021 FINDINGS: Poor inspiration. Cardiomegaly and aortic atherosclerosis. Pulmonary venous hypertension with mild interstitial edema. Small effusions and lower lobe atelectasis. IMPRESSION: Findings most consistent with congestive heart failure. Cannot rule out coexistent pneumonia in the lower lobes.  Similar appearance compared to the study of 09/14. There may be slightly worse volume loss at the left base. Electronically Signed   By: Nelson Chimes M.D.   On: 11/02/2021 13:36   DG Chest Port 1 View  Result Date: 10/27/2021 CLINICAL DATA:  Shortness of breath EXAM: PORTABLE CHEST 1 VIEW COMPARISON:  Radiographs 10/23/2021 FINDINGS: Stable cardiomegaly. Right basilar atelectasis/consolidation. Interstitial markings are prominent bilaterally likely due to low lung volumes and chronic interstitial lung disease. Question trace pleural effusions bilaterally are pleural thickening. No pneumothorax. Elevated right hemidiaphragm. No acute osseous abnormality. Aortic atherosclerotic calcification. IMPRESSION: Elevated right hemidiaphragm with associated presumed atelectasis though pneumonia is difficult to exclude. Hypoinflation and chronic interstitial prominence. Bilateral trace pleural effusions or pleural thickening. Cardiomegaly. Electronically Signed   By: Placido Sou M.D.   On: 10/27/2021 19:15    Barton Dubois, MD  [facility]  If 7PM-7AM, please contact night-coverage [email or web address] Password [email or web address] 11/23/2021, 8:45 PM   LOS: 6 days

## 2021-11-23 NOTE — Progress Notes (Signed)
Patient confused, she is alert to self, and slept this shift. She refused oral 2200 medications Dr. Josephine Cables notified. No new orders. Continued to monitor patient through this shift.

## 2021-11-23 NOTE — Progress Notes (Signed)
Pt refused to take medication by mouth. PT became aggressive with nursing staff and spit medication on this nurse.

## 2021-11-23 NOTE — Progress Notes (Addendum)
Pharmacy Antibiotic Note  Jody [name] a 81 y.o. female admitted on 11/23/2021 with sepsis.  Pharmacy has been consulted for vancomycin dosing. Last full HD session was 10/3 as patient has not been tolerating prior sessions.  Plan: Hold further vanc doses for guidance around future dialysis sessions  2.   Check random level as needed prior to a subsequent dose  Allergies:  Allergies  Allergen Reactions   Codeine Nausea And Vomiting    Filed Weights   11/20/21 0349 11/21/21 0500 11/21/21 1718  Weight: 86.8 kg (191 lb 5.8 oz) 86.5 kg (190 lb 11.2 oz) 86.9 kg (191 lb 9.3 oz)       Latest Ref Rng & Units 11/21/2021    7:37 PM 11/20/2021    1:28 PM 11/19/2021    4:15 AM  CBC  WBC 4.0 - 10.5 K/uL 4.3  3.0  4.5   Hemoglobin 12.0 - 15.0 g/dL 8.8  9.2  9.9   Hematocrit 36.0 - 46.0 % 28.6  30.4  33.7   Platelets 150 - 400 K/uL 127  86  107    Estimated Creatinine Clearance: 9.6 mL/min (A) (by C-G formula based on SCr of 5.03 mg/dL (H)).  Antimicrobials this admission:  Vanc 10/5>> Zosyn 10/5>>  Microbiology results: 12/09/2021  BCx: NG - final  Thank you for allowing pharmacy to be a part of this patient's care.  Wilson Dusenbery, Dionne Bucy, PharmD Clinical Pharmacist

## 2021-11-23 NOTE — Progress Notes (Signed)
Unable to pick up sat on patient fingers(left hand) tried on all five,pt has mitt on rt hand  patient on 4.5lpm cann

## 2021-11-23 NOTE — Progress Notes (Signed)
Subjective:  no acute events. Remains confused. Right hand mitt in place. Opens eyes to tactile and vocal stimuli.  Objective Vital signs in last 24 hours: Vitals:   11/22/21 1033 11/22/21 1238 11/22/21 2010 11/23/21 0531  BP:  111/80 127/81 (!) 103/92  Pulse:  69 89 61  Resp:  '20 19 20  '$ Temp: 99.2 F (37.3 C) 98.2 F (36.8 C) 98.2 F (36.8 C) 98 F (36.7 C)  TempSrc: Axillary Axillary    SpO2:  98% (!) 86% 97%  Weight:      Height:       Weight change:   Intake/Output Summary (Last 24 hours) at 11/23/2021 0831 Last data filed at 11/23/2021 0510 Gross per 24 hour  Intake 710.71 ml  Output --  Net 710.71 ml    Physical Exam: General: pt confused- Heart: RRR Lungs: decreased breath sounds bibasilar, normal WOB Abdomen: obese Extremities: 1+ pitting edema+dependent edema Neuro: opens eye to tactile and vocal stimuli, not conversant Dialysis Access: RUE AVF +b/t    Labs: Basic Metabolic Panel: Recent Labs  Lab 11/19/21 0415 11/20/21 1328 11/21/21 1937  NA 132* 129* 126*  K 5.1 5.5* 5.3*  CL 92* 91* 91*  CO2 26 23 18*  GLUCOSE 124* 118* 74  BUN 54* 61* 71*  CREATININE 3.89* 4.61* 5.03*  CALCIUM 9.3 9.3 9.0  PHOS  --   --  7.3*   Liver Function Tests: Recent Labs  Lab 11/24/2021 1115 11/18/21 0729 11/19/21 0415 11/21/21 1937  AST '20 20 17  '$ --   ALT '19 21 19  '$ --   ALKPHOS 148* 130* 106  --   BILITOT 1.1 1.4* 1.7*  --   PROT 5.9* 6.1* 5.9*  --   ALBUMIN 3.2* 3.4* 3.3* 3.8   No results for input(s): "LIPASE", "AMYLASE" in the last 168 hours. No results for input(s): "AMMONIA" in the last 168 hours. CBC: Recent Labs  Lab 12/05/2021 1115 11/18/21 0729 11/19/21 0415 11/20/21 1328 11/21/21 1937  WBC 3.9* 7.4 4.5 3.0* 4.3  NEUTROABS 3.0  --   --   --   --   HGB 10.3* 10.3* 9.9* 9.2* 8.8*  HCT 36.0 35.5* 33.7* 30.4* 28.6*  MCV 112.9* 110.9* 109.8* 106.3* 104.0*  PLT 87* 130* 107* 86* 127*   Cardiac Enzymes: No results for input(s): "CKTOTAL",  "CKMB", "CKMBINDEX", "TROPONINI" in the last 168 hours. CBG: Recent Labs  Lab 11/21/2021 1135  GLUCAP 162*    Iron Studies: No results for input(s): "IRON", "TIBC", "TRANSFERRIN", "FERRITIN" in the last 72 hours. Studies/Results: No results found. Medications: Infusions:  norepinephrine (LEVOPHED) Adult infusion Stopped (11/20/21 0857)   piperacillin-tazobactam (ZOSYN) 2.25 g in sodium chloride 0.9 % 50 mL IVPB 2.25 g (11/23/21 0510)   vancomycin 1,000 mg (11/21/21 2131)    Scheduled Medications:  ALPRAZolam  0.5 mg Oral Q M,W,F-HD   apixaban  2.5 mg Oral BID   calcitRIOL  0.5 mcg Oral Q M,W,F-HD   Chlorhexidine Gluconate Cloth  6 each Topical Daily   Chlorhexidine Gluconate Cloth  6 each Topical Q0600   cinacalcet  30 mg Oral Q M,W,F-HD   darbepoetin (ARANESP) injection - DIALYSIS  100 mcg Intravenous Q Mon-HD   midodrine  10 mg Oral TID WC   mouth rinse  15 mL Mouth Rinse 4 times per day    have reviewed scheduled and prn medications.  OP HD orders:  [facility], TTS, 3 hours 15 minutes.  EDW 81 kg.  2K/2.5 calcium.  RUE  AVF 400/500.  Meds: Mircera 200 mcg every 2 weeks, Venofer 50 mg q, calcitriol 0.5 mcg 3 times a week, Sensipar 30 mg 3 times a week.  No heparin (history of GI bleed)   Assessment/ Plan: Pt is a 81 y.o. yo female who was admitted on 11/25/2021 with hypotension /sepsis Assessment/Plan: 1. Sepsis-  thought due to PNA- has responded to abx and BP has normalized  2. ESRD - normally TTS-  last done not full treatment on 10/5-  then felt to be too unstable and did not do HD over weekend-  family now wished to resume-  attempted HD off schedule via AVF 10/9-safety was a concern/patient could not cooperate.  Most certainly could not be dialyzed at an OP center this way-   if her MS does not improve I do not think she is a candidate for continues dialysis. Will attempt HD today. Will tentatively keep on MWF schedule this week 3. Anemia-  on ESA 4. Secondary  hyperparathyroidism- continue calcitriol , sensipar-  no binder yet  5. HTN/volume- dUF as able today -  on midodrine 6. Hyponatremia- likely related to hypervolemia. Will UF as tolerated, 137Na bath 7. Dispo-  will attempt to do HD today but safety is a concern. If unable to do HD today then would recommend revisiting goals of care as HD would not be feasible as an outpatient  Gean Quint, MD [place] 11/23/2021,8:31 AM  LOS: 6 days

## 2021-11-24 DIAGNOSIS — E875 Hyperkalemia: Secondary | ICD-10-CM | POA: Diagnosis not present

## 2021-11-24 DIAGNOSIS — N186 End stage renal disease: Secondary | ICD-10-CM | POA: Diagnosis not present

## 2021-11-24 DIAGNOSIS — R627 Adult failure to thrive: Secondary | ICD-10-CM | POA: Diagnosis not present

## 2021-11-24 DIAGNOSIS — J9602 Acute respiratory failure with hypercapnia: Secondary | ICD-10-CM | POA: Diagnosis not present

## 2021-11-24 DIAGNOSIS — Z515 Encounter for palliative care: Secondary | ICD-10-CM | POA: Diagnosis not present

## 2021-11-24 DIAGNOSIS — Z7189 Other specified counseling: Secondary | ICD-10-CM | POA: Diagnosis not present

## 2021-11-24 DIAGNOSIS — A419 Sepsis, unspecified organism: Secondary | ICD-10-CM | POA: Diagnosis not present

## 2021-11-24 DIAGNOSIS — I4819 Other persistent atrial fibrillation: Secondary | ICD-10-CM | POA: Diagnosis not present

## 2021-11-24 LAB — VANCOMYCIN, RANDOM: Vancomycin Rm: 36 ug/mL

## 2021-11-24 NOTE — Progress Notes (Signed)
Palliative:  HPI: 81 y.o. female  with past medical history of stroke, ESRD on HD, COPD on 3L oxygen, CHF, atrial fib, PAD, recent left great toe osteomyelitis s/p amputation, anemia of chronic disease, diabetes admitted on 12/05/2021 with altered mental status related to septic shock likely pneumonia with acute on chronic respiratory failure.    I met today with Jody [name] - no family or visitors at bedside. She is sleeping soundly. Does not awaken to my visit. Breakfast tray at bedside with only a couple bites missing. I discussed with RN who reports more lethargy and less responsive but not combative today. She attempted to provide medications and Jody [name] attempted to spit out but she was able to spoon medication back into her mouth to take.   I called and discussed further with HCP[facility] [name]. [name] visited with Jody [name] who already had family at bedside yesterday evening and family was able to keep her calm and get her to eat most of her dinner. [name] shares that Jody [name] told her that she just feels so bad but told family that she would do better and cooperate with care and dialysis. I explained to [name] that this is not happening even this morning. [name] would like another trial of dialysis. I explained that if dialysis tomorrow does not go well we may not have a choice but to focus on comfort. She continues with poor intake and cooperation of care. I explained that if she is resistant and combative to dialysis again tomorrow then we cannot continue to force this on her and will need to provide her with comfort. We discussed how much she has gone through and that the body can only go so long with being this ill and poor intake. [name] expresses understanding.   All questions/concerns addressed. Emotional support provided.   Exam: No response. Sleeping comfortably. Breathing regular, unlabored. Abd soft. BLE edema.   Plan: - One more trial of dialysis requested for tomorrow.  - Poor  prognosis. Would benefit from comfort care and hospice.   [place], NP Palliative Medicine Team Pager [phone] (Please see [email or web address] for schedule) Team Phone [phone]    Greater than 50%  of this time was spent counseling and coordinating care related to the above assessment and plan

## 2021-11-24 NOTE — Progress Notes (Addendum)
Subjective:  s/p HD yesterday. Safety was a concern again: refused PO meds, picking at PIV site, clawed and bit HD RN therefore HD d/c'ed due to combativeness. Net uf 1L. Ran for ~2hrs 36mn. Currently resting comfortably, does grimace to tactile stimuli  Objective Vital signs in last 24 hours: Vitals:   11/23/21 1515 11/23/21 1545 11/23/21 2112 11/24/21 0635  BP: 109/81 1'15/82 99/64 93/70 '$  Pulse: 91 98 86 68  Resp: '18 20 20 16  '$ Temp:   98.5 F (36.9 C) 98.4 F (36.9 C)  TempSrc:  Axillary Axillary Oral  SpO2:  96% 95% 97%  Weight:      Height:       Weight change:   Intake/Output Summary (Last 24 hours) at 11/24/2021 0745 Last data filed at 11/23/2021 1700 Gross per 24 hour  Intake 360 ml  Output 1 ml  Net 359 ml    Physical Exam: General: NAD, resting comfortably currently Heart: RRR Lungs: decreased breath sounds bibasilar, normal WOB Abdomen: obese Extremities: trace dependent edema Neuro: grimaces to tactile and vocal stimuli, not conversant Dialysis Access: RUE AVF +b/t    Labs: Basic Metabolic Panel: Recent Labs  Lab 11/20/21 1328 11/21/21 1937 11/23/21 1328  NA 129* 126* 133*  K 5.5* 5.3* 4.7  CL 91* 91* 95*  CO2 23 18* 20*  GLUCOSE 118* 74 74  BUN 61* 71* 69*  CREATININE 4.61* 5.03* 4.88*  CALCIUM 9.3 9.0 9.3  PHOS  --  7.3* 6.6*   Liver Function Tests: Recent Labs  Lab 12/10/2021 1115 11/18/21 0729 11/19/21 0415 11/21/21 1937 11/23/21 1328  AST '20 20 17  '$ --   --   ALT '19 21 19  '$ --   --   ALKPHOS 148* 130* 106  --   --   BILITOT 1.1 1.4* 1.7*  --   --   PROT 5.9* 6.1* 5.9*  --   --   ALBUMIN 3.2* 3.4* 3.3* 3.8 3.3*   No results for input(s): "LIPASE", "AMYLASE" in the last 168 hours. No results for input(s): "AMMONIA" in the last 168 hours. CBC: Recent Labs  Lab 11/16/2021 1115 11/18/21 0729 11/19/21 0415 11/20/21 1328 11/21/21 1937 11/23/21 1328  WBC 3.9* 7.4 4.5 3.0* 4.3 5.4  NEUTROABS 3.0  --   --   --   --   --   HGB 10.3*  10.3* 9.9* 9.2* 8.8* 8.8*  HCT 36.0 35.5* 33.7* 30.4* 28.6* 27.9*  MCV 112.9* 110.9* 109.8* 106.3* 104.0* 104.1*  PLT 87* 130* 107* 86* 127* 138*   Cardiac Enzymes: No results for input(s): "CKTOTAL", "CKMB", "CKMBINDEX", "TROPONINI" in the last 168 hours. CBG: Recent Labs  Lab 11/22/2021 1135  GLUCAP 162*    Iron Studies: No results for input(s): "IRON", "TIBC", "TRANSFERRIN", "FERRITIN" in the last 72 hours. Studies/Results: No results found. Medications: Infusions:  norepinephrine (LEVOPHED) Adult infusion Stopped (11/20/21 0857)   piperacillin-tazobactam (ZOSYN) 2.25 g in sodium chloride 0.9 % 50 mL IVPB 2.25 g (11/24/21 0646)    Scheduled Medications:  ALPRAZolam  0.5 mg Oral Q M,W,F-HD   apixaban  2.5 mg Oral BID   calcitRIOL  0.5 mcg Oral Q M,W,F-HD   Chlorhexidine Gluconate Cloth  6 each Topical Daily   Chlorhexidine Gluconate Cloth  6 each Topical Q0600   cinacalcet  30 mg Oral Q M,W,F-HD   darbepoetin (ARANESP) injection - DIALYSIS  100 mcg Intravenous Q Mon-HD   midodrine  10 mg Oral TID WC   mouth rinse  15 mL  Mouth Rinse 4 times per day    have reviewed scheduled and prn medications.  OP HD orders:  [facility], TTS, 3 hours 15 minutes.  EDW 81 kg.  2K/2.5 calcium.  RUE AVF 400/500.  Meds: Mircera 200 mcg every 2 weeks, Venofer 50 mg q, calcitriol 0.5 mcg 3 times a week, Sensipar 30 mg 3 times a week.  No heparin (history of GI bleed)   Assessment/ Plan: Pt is a 81 y.o. yo female who was admitted on 11/24/2021 with hypotension /sepsis Assessment/Plan: 1. Sepsis-  thought due to PNA- has responded to abx and BP has normalized  2. ESRD - normally TTS-  last done not full treatment on 10/5-  then felt to be too unstable and did not do HD over weekend-  family now wished to resume-  attempted HD off schedule via AVF 10/9-safety was a concern/patient could not cooperate. Same issues on 10/11 (combativeness) Most certainly could not be dialyzed at an OP center this  way-   mental status has not improved-I do not think she is a candidate for continued dialysis. Appreciate palliative care's assistance.  -she is currently off schedule for HD. Will hold off on placing orders for tomorrow until palliative care discusses with family 3. Anemia-  on ESA 4. Secondary hyperparathyroidism- continue calcitriol , sensipar-  no binder yet  5. HTN/volume- UF as able -  on midodrine 6. Hyponatremia- likely related to hypervolemia. Will UF as tolerated, 137Na bath. Na improved to 133 on 10/11 7. Dispo-  at this junction, HD is too unsafe for her given combativeness, cannot do dialysis like this especially in an outpatient setting. Would recommend discussing comfort care measures, appreciate palliative care's assistance.  Gean Quint, MD [facility] 11/24/2021,7:45 AM  LOS: 7 days
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
## 2021-11-24 NOTE — TOC Progression Note (Signed)
Transition of Care [facility]) - Progression Note    Patient Details  [name] MRN: [id number] Date of Birth: 1940/11/01  Transition of Care [facility]) CM/SW Contact  Boneta Lucks, RN Phone Number: 11/24/2021, 11:37 AM  Clinical Narrative:   Continuing palliative discussions with POA, patient had been combative with dialysis. UNCR updated.    Expected Discharge Plan: Long Term Acute Care (LTAC) Barriers to Discharge: Continued Medical Work up, Family Issues, Other (must enter comment) (Continuing Palliative discussions)  Expected Discharge Plan and Services Expected Discharge Plan: [place] (LTAC)      Readmission Risk Interventions    08/15/2021   11:49 AM 06/07/2021    2:47 PM 06/01/2021   10:59 AM  Readmission Risk Prevention Plan  Transportation Screening Complete Complete Complete  Medication Review [employer])  Complete Complete  PCP or Specialist appointment within 3-5 days of discharge  Complete   HRI or Home Care Consult Complete Complete Complete  SW Recovery Care/Counseling Consult Complete Complete Complete  Palliative Care Screening Not Applicable Complete Not Applicable  Skilled Nursing Facility Complete Complete Complete

## 2021-11-24 NOTE — Progress Notes (Signed)
Pt had an uneventful night. She was able to take meds for this RN and have a few bites of sherbert. She became slightly agitated when this RN and NT were turning her and performing pericare. She c/o headache and buttocks hurting. PRN dilaudid was given and patient remained comfortable the remainder of the night. Bryson Corona Edd Fabian

## 2021-11-25 DIAGNOSIS — R627 Adult failure to thrive: Secondary | ICD-10-CM | POA: Diagnosis not present

## 2021-11-25 DIAGNOSIS — N186 End stage renal disease: Secondary | ICD-10-CM | POA: Diagnosis not present

## 2021-11-25 DIAGNOSIS — Z7189 Other specified counseling: Secondary | ICD-10-CM | POA: Diagnosis not present

## 2021-11-25 DIAGNOSIS — Z515 Encounter for palliative care: Secondary | ICD-10-CM | POA: Diagnosis not present

## 2021-11-25 MED ORDER — FENTANYL CITRATE (PF) 100 MCG/2ML IJ SOLN
INTRAMUSCULAR | Status: AC
  Filled 2021-11-25: qty 2

## 2021-11-25 MED ORDER — MIDAZOLAM HCL 2 MG/2ML IJ SOLN
INTRAMUSCULAR | Status: AC
  Filled 2021-11-25: qty 2

## 2021-11-25 NOTE — Progress Notes (Signed)
Subjective:  palliative care has discussed with family, they wishes for one more trial of dialysis. Patient more awake for me today, conversant. Repeatedly saying "I want to go home"  Objective Vital signs in last 24 hours: Vitals:   11/24/21 1430 11/24/21 1645 11/24/21 2155 11/25/21 0547  BP: (!) 117/103 101/83 109/75 116/66  Pulse: 70  93 85  Resp: '18  20 20  '$ Temp: 98.4 F (36.9 C)  98 F (36.7 C) 98.2 F (36.8 C)  TempSrc: Oral     SpO2: 98%  97% 98%  Weight:      Height:       Weight change:   Intake/Output Summary (Last 24 hours) at 11/25/2021 0859 Last data filed at 11/24/2021 0900 Gross per 24 hour  Intake 0 ml  Output --  Net 0 ml    Physical Exam: General: NAD, awake, mitts in place bilaterally, sitting up in bed Heart: RRR Lungs: decreased breath sounds bibasilar, normal WOB Abdomen: soft, nt/nd Extremities: dependent edema Neuro: awake, following some commands Dialysis Access: RUE AVF +b/t  Labs: Basic Metabolic Panel: Recent Labs  Lab 11/20/21 1328 11/21/21 1937 11/23/21 1328  NA 129* 126* 133*  K 5.5* 5.3* 4.7  CL 91* 91* 95*  CO2 23 18* 20*  GLUCOSE 118* 74 74  BUN 61* 71* 69*  CREATININE 4.61* 5.03* 4.88*  CALCIUM 9.3 9.0 9.3  PHOS  --  7.3* 6.6*   Liver Function Tests: Recent Labs  Lab 11/19/21 0415 11/21/21 1937 11/23/21 1328  AST 17  --   --   ALT 19  --   --   ALKPHOS 106  --   --   BILITOT 1.7*  --   --   PROT 5.9*  --   --   ALBUMIN 3.3* 3.8 3.3*   No results for input(s): "LIPASE", "AMYLASE" in the last 168 hours. No results for input(s): "AMMONIA" in the last 168 hours. CBC: Recent Labs  Lab 11/19/21 0415 11/20/21 1328 11/21/21 1937 11/23/21 1328  WBC 4.5 3.0* 4.3 5.4  HGB 9.9* 9.2* 8.8* 8.8*  HCT 33.7* 30.4* 28.6* 27.9*  MCV 109.8* 106.3* 104.0* 104.1*  PLT 107* 86* 127* 138*   Cardiac Enzymes: No results for input(s): "CKTOTAL", "CKMB", "CKMBINDEX", "TROPONINI" in the last 168 hours. CBG: No results for  input(s): "GLUCAP" in the last 168 hours.   Iron Studies: No results for input(s): "IRON", "TIBC", "TRANSFERRIN", "FERRITIN" in the last 72 hours. Studies/Results: No results found. Medications: Infusions:  norepinephrine (LEVOPHED) Adult infusion Stopped (11/20/21 0857)   piperacillin-tazobactam (ZOSYN) 2.25 g in sodium chloride 0.9 % 50 mL IVPB 2.25 g (11/25/21 0609)    Scheduled Medications:  ALPRAZolam  0.5 mg Oral Q M,W,F-HD   apixaban  2.5 mg Oral BID   calcitRIOL  0.5 mcg Oral Q M,W,F-HD   Chlorhexidine Gluconate Cloth  6 each Topical Daily   Chlorhexidine Gluconate Cloth  6 each Topical Q0600   cinacalcet  30 mg Oral Q M,W,F-HD   darbepoetin (ARANESP) injection - DIALYSIS  100 mcg Intravenous Q Mon-HD   midodrine  10 mg Oral TID WC   mouth rinse  15 mL Mouth Rinse 4 times per day    have reviewed scheduled and prn medications.  OP HD orders:  [facility], TTS, 3 hours 15 minutes.  EDW 81 kg.  2K/2.5 calcium.  RUE AVF 400/500.  Meds: Mircera 200 mcg every 2 weeks, Venofer 50 mg q, calcitriol 0.5 mcg 3 times a week, Sensipar  30 mg 3 times a week.  No heparin (history of GI bleed)   Assessment/ Plan: Pt is a 81 y.o. yo female who was admitted on 11/19/2021 with hypotension /sepsis Assessment/Plan: 1. Sepsis-  thought due to PNA- has responded to abx and BP has normalized  2. ESRD - normally TTS-  last done not full treatment on 10/5-  then felt to be too unstable and did not do HD over weekend-  family now wished to resume-  attempted HD off schedule via AVF 10/9-safety was a concern/patient could not cooperate. Same issues on 10/11 (combativeness) Most certainly could not be dialyzed at an OP center this way-   mental status has not improved-I do not think she is a candidate for continued dialysis. Appreciate palliative care's assistance.  -HD tomorrow, will get her back on her TTS schedule. One more trial of HD and if same aforementioned issues then the risks>benefits in  regards to dialysis (recommend comfort care if that is the case) 3. Anemia-  on ESA 4. Secondary hyperparathyroidism- continue calcitriol , sensipar-  no binder yet  5. HTN/volume- UF as able -  on midodrine 6. Hyponatremia- likely related to hypervolemia. Will UF as tolerated, 137Na bath. Na improved to 133 on 10/11 7. Dispo-  at this junction, HD is too unsafe for her given combativeness, cannot do dialysis like this especially in an outpatient setting. One more trial of dialysis per family request as above  Gean Quint, MD [place] 11/25/2021,8:59 AM  LOS: 8 days
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
## 2021-11-26 DIAGNOSIS — I4819 Other persistent atrial fibrillation: Secondary | ICD-10-CM | POA: Diagnosis not present

## 2021-11-26 DIAGNOSIS — J449 Chronic obstructive pulmonary disease, unspecified: Secondary | ICD-10-CM

## 2021-11-26 DIAGNOSIS — G9341 Metabolic encephalopathy: Secondary | ICD-10-CM

## 2021-11-26 DIAGNOSIS — I10 Essential (primary) hypertension: Secondary | ICD-10-CM

## 2021-11-26 DIAGNOSIS — A419 Sepsis, unspecified organism: Secondary | ICD-10-CM | POA: Diagnosis not present

## 2021-11-26 DIAGNOSIS — N186 End stage renal disease: Secondary | ICD-10-CM | POA: Diagnosis not present

## 2021-11-26 LAB — CBC WITH DIFFERENTIAL/PLATELET
Abs Immature Granulocytes: 0.07 10*3/uL (ref 0.00–0.07)
Basophils Absolute: 0 10*3/uL (ref 0.0–0.1)
Basophils Relative: 0 %
Eosinophils Absolute: 0 10*3/uL (ref 0.0–0.5)
Eosinophils Relative: 0 %
HCT: 30.2 % — ABNORMAL LOW (ref 36.0–46.0)
Hemoglobin: 9.3 g/dL — ABNORMAL LOW (ref 12.0–15.0)
Lymphocytes Relative: 19 %
Lymphs Abs: 1.1 10*3/uL (ref 0.7–4.0)
MCH: 32.3 pg (ref 26.0–34.0)
MCHC: 30.8 g/dL (ref 30.0–36.0)
MCV: 104.9 fL — ABNORMAL HIGH (ref 80.0–100.0)
Monocytes Absolute: 0.5 10*3/uL (ref 0.1–1.0)
Monocytes Relative: 9 %
Neutro Abs: 3.7 10*3/uL (ref 1.7–7.7)
Neutrophils Relative %: 72 %
Platelets: 141 10*3/uL — ABNORMAL LOW (ref 150–400)
RBC: 2.88 MIL/uL — ABNORMAL LOW (ref 3.87–5.11)
RDW: 18.5 % — ABNORMAL HIGH (ref 11.5–15.5)
WBC: 5.8 10*3/uL (ref 4.0–10.5)
nRBC: 3.1 % — ABNORMAL HIGH (ref 0.0–0.2)
nRBC: 6 /100 WBC — ABNORMAL HIGH

## 2021-11-26 LAB — RENAL FUNCTION PANEL
Albumin: 3.2 g/dL — ABNORMAL LOW (ref 3.5–5.0)
Anion gap: 17 — ABNORMAL HIGH (ref 5–15)
BUN: 55 mg/dL — ABNORMAL HIGH (ref 8–23)
CO2: 24 mmol/L (ref 22–32)
Calcium: 9.4 mg/dL (ref 8.9–10.3)
Chloride: 96 mmol/L — ABNORMAL LOW (ref 98–111)
Creatinine, Ser: 4.77 mg/dL — ABNORMAL HIGH (ref 0.44–1.00)
GFR, Estimated: 9 mL/min — ABNORMAL LOW (ref >60)
Glucose, Bld: 120 mg/dL — ABNORMAL HIGH (ref 70–99)
Phosphorus: 6.4 mg/dL — ABNORMAL HIGH (ref 2.5–4.6)
Potassium: 4 mmol/L (ref 3.5–5.1)
Sodium: 137 mmol/L (ref 135–145)

## 2021-11-27 DIAGNOSIS — R6889 Other general symptoms and signs: Secondary | ICD-10-CM | POA: Diagnosis not present

## 2021-11-27 DIAGNOSIS — M6281 Muscle weakness (generalized): Secondary | ICD-10-CM | POA: Diagnosis not present

## 2021-11-27 DIAGNOSIS — J9601 Acute respiratory failure with hypoxia: Secondary | ICD-10-CM | POA: Diagnosis not present

## 2021-11-27 DIAGNOSIS — I5032 Chronic diastolic (congestive) heart failure: Secondary | ICD-10-CM | POA: Diagnosis not present

## 2021-11-27 NOTE — Progress Notes (Signed)
Notified by tele HR was in 59s. Rapid called. Time of death announced at May 09, 2345. [name] notified and understanding. States she will be in touch tomorrow once she notifies family.

## 2021-11-30 ENCOUNTER — Ambulatory Visit: Payer: Medicare Other | Admitting: Podiatry
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
## 2021-12-14 NOTE — Progress Notes (Signed)
Dialysis today and 2.5 liters removed.  Returned from dialysis and had large bm.  Dressing changed to sacrum.  Feet elevated on pillows

## 2021-12-14 NOTE — Progress Notes (Signed)
   December 20, 2021 1336  Vitals  Temp 97.9 F (36.6 C)  Temp Source Oral  BP 106/67  MAP (mmHg) 79  BP Location Left Arm  BP Method Automatic  Patient Position (if appropriate) Lying  Pulse Rate 91  Pulse Rate Source Monitor  ECG Heart Rate 91  Resp 20  Oxygen Therapy  SpO2 94 %  O2 Device Nasal Cannula  O2 Flow Rate (L/min) 3 L/min  Patient Activity (if Appropriate) In bed  Pulse Oximetry Type Continuous  During Treatment Monitoring  Blood Flow Rate (mL/min) 200 mL/min  HD Safety Checks Performed Yes  Intra-Hemodialysis Comments Tx completed  Post Treatment  Dialyzer Clearance Lightly streaked  Duration of HD Treatment -hour(s) 3.75 hour(s)  Hemodialysis Intake (mL) 0 mL  Liters Processed 90  Fluid Removed 2500 mL  Tolerated HD Treatment Yes  Post-Hemodialysis Comments tx completed. no complications.  AVG/AVF Arterial Site Held (minutes) 10 minutes  AVG/AVF Venous Site Held (minutes) 10 minutes  Fistula / Graft Right Upper arm Arteriovenous fistula  Placement Date: 09/06/21   Placed prior to admission: Yes  Orientation: Right  Access Location: Upper arm  Access Type: Arteriovenous fistula  Site Condition No complications  Fistula / Graft Assessment Present;Thrill;Bruit  Status Deaccessed  Needle Size 15  Drainage Description None

## 2021-12-14 NOTE — Assessment & Plan Note (Signed)
Continue blood pressure support with midodrine.  

## 2021-12-14 NOTE — Progress Notes (Signed)
Refusing her morning meds of midodrine and eliquis as well as breakfast.  Multiple attempts made. She seems somewhat confused but when turned lights out she stated "leave the lights on" and "leave me alone".  Secure chatted refusal of meds to dialysis nurse and hospitalist.

## 2021-12-14 NOTE — Assessment & Plan Note (Addendum)
Patient has been rate controlled, had anticoagulation with apixaban.

## 2021-12-14 NOTE — Assessment & Plan Note (Signed)
Patient with persistent encephalopathy, positive delirium Continue supportive medical therapy, neuro checks per unit protocol and nutritional support.  Patient with poor prognosis.

## 2021-12-14 NOTE — Assessment & Plan Note (Addendum)
Her prognosis continue was very poor, due multiple medical problems and cognitive impairment.  She had difficulty tolerating renal replacement therapy.   Anemia of chronic renal disease, continue with EPO Metabolic bone disease continue with calcitriol.

## 2021-12-14 NOTE — Assessment & Plan Note (Signed)
likely due topneumonia -presented with hypothermia, hypotension, altered mental status   Acute on chronic respiratory failure with hypoxia and hypercapnia (HCC)   Lobar Pneumonia   ESRD on dialysis ([employer])    Acute on chronic combined systolic and diastolic CHF (congestive heart failure) (HCC)  Dysphagia   Persistent atrial fibrillation (HCC)   Essential hypertension   Thrombocytopenia (HCC)   PAD (peripheral artery disease) ([place])

## 2021-12-14 NOTE — Procedures (Signed)
I was present at this dialysis session. I have reviewed the session itself and made appropriate changes.  Initially combative but eventually agreed to proceed with dialysis.  Currently on HD and tolerating it well.  SBP 100.  Resting comfortably.    Vital signs in last 24 hours:  Temp:  [97.7 F (36.5 C)-98.3 F (36.8 C)] 97.7 F (36.5 C) (10/14 0936) Pulse Rate:  [67-96] 96 (10/14 1230) Resp:  [16-22] 22 (10/14 1230) BP: (91-106)/(54-85) 97/59 (10/14 1230) SpO2:  [91 %-100 %] 92 % (10/14 1230) Weight:  [89.8 kg] 89.8 kg (10/14 0900) Weight change:  Filed Weights   11/21/21 0500 11/21/21 1718 11/29/21 0900  Weight: 86.5 kg 86.9 kg 89.8 kg    Recent Labs  Lab 29-Nov-2021 0838  NA 137  K 4.0  CL 96*  CO2 24  GLUCOSE 120*  BUN 55*  CREATININE 4.77*  CALCIUM 9.4  PHOS 6.4*    Recent Labs  Lab 11/21/21 1937 11/23/21 1328 11/29/2021 0838  WBC 4.3 5.4 5.8  NEUTROABS  --   --  3.7  HGB 8.8* 8.8* 9.3*  HCT 28.6* 27.9* 30.2*  MCV 104.0* 104.1* 104.9*  PLT 127* 138* 141*    Scheduled Meds:  ALPRAZolam  0.5 mg Oral Q M,W,F-HD   apixaban  2.5 mg Oral BID   calcitRIOL  0.5 mcg Oral Q M,W,F-HD   Chlorhexidine Gluconate Cloth  6 each Topical Daily   Chlorhexidine Gluconate Cloth  6 each Topical Q0600   cinacalcet  30 mg Oral Q M,W,F-HD   darbepoetin (ARANESP) injection - DIALYSIS  100 mcg Intravenous Q Mon-HD   midodrine  10 mg Oral TID WC   mouth rinse  15 mL Mouth Rinse 4 times per day   Continuous Infusions:  norepinephrine (LEVOPHED) Adult infusion Stopped (11/20/21 0857)   piperacillin-tazobactam (ZOSYN) 2.25 g in sodium chloride 0.9 % 50 mL IVPB 2.25 g (11/29/21 0519)   PRN Meds:.acetaminophen, HYDROmorphone (DILAUDID) injection, ipratropium-albuterol, lidocaine (PF), lidocaine-prilocaine, mouth rinse, pentafluoroprop-tetrafluoroeth   Donetta Potts,  MD 11-29-21, 1:03 PM

## 2021-12-14 NOTE — Assessment & Plan Note (Signed)
No clinical signs of exacerbation Continue oxymetry monitoring.  
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
## 2021-12-14 NOTE — Assessment & Plan Note (Addendum)
Pneumonia community acquired present on admission.   Patient has been treated with antibiotic therapy vanc and zosyn, today completed 7 days.  Off vasopressors on 10/08.  Continue with midodrine for blood pressure support.   Acute hypoxemic and hypercapnic respiratory failure.  Patient required Bipap on admission, now her oxygenation has improved.

## 2021-12-14 DEATH — deceased

## 2021-12-21 ENCOUNTER — Ambulatory Visit: Payer: Medicare Other | Admitting: Cardiology

## 2022-12-17 IMAGING — CT CT HEAD W/O CM
4 series · 16 of 47 positions shown, 18 images · non-contrast
Comparison: Brain MRI 03/07/2021.  Head CT 03/03/2021.

CLINICAL DATA: 81-year-old female with altered mental status.



[Series 3: head without · axial · non-contrast · 0.44mm/px · z∈[-120,-10]mm · 7 of 30 slices shown, 9 images]
[im 4/30  brain]
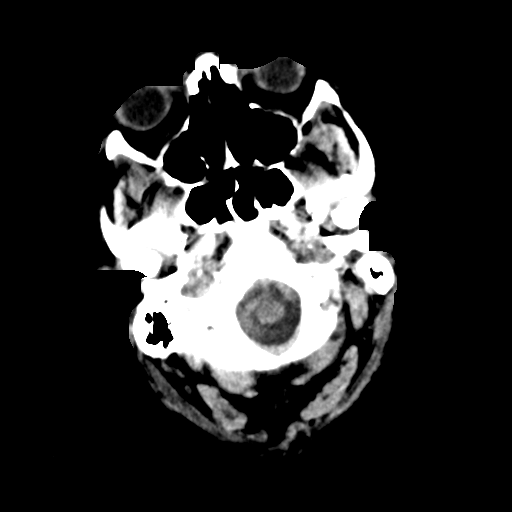
[im 4/30  bone]
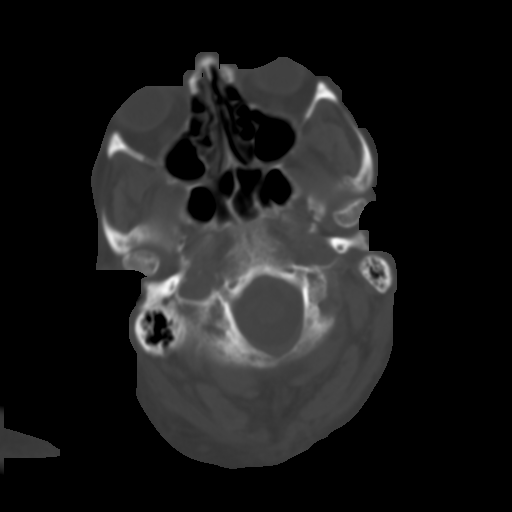
[im 8/30  brain]
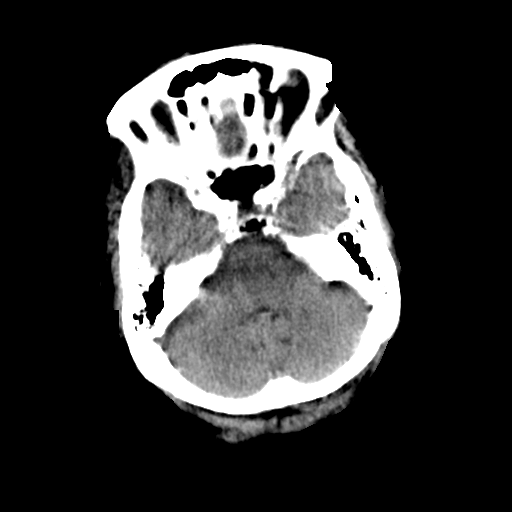
[im 11/30  brain]
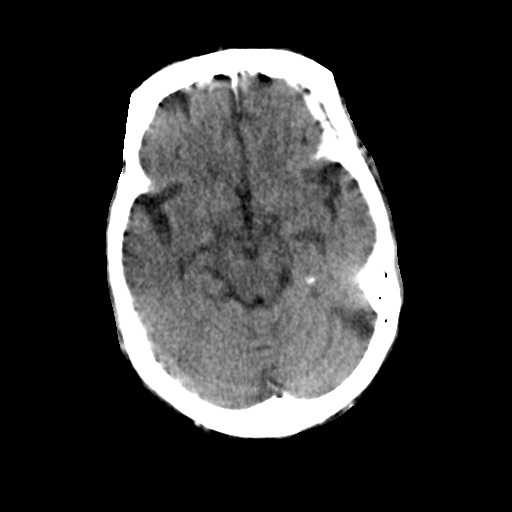
[im 15/30  brain]
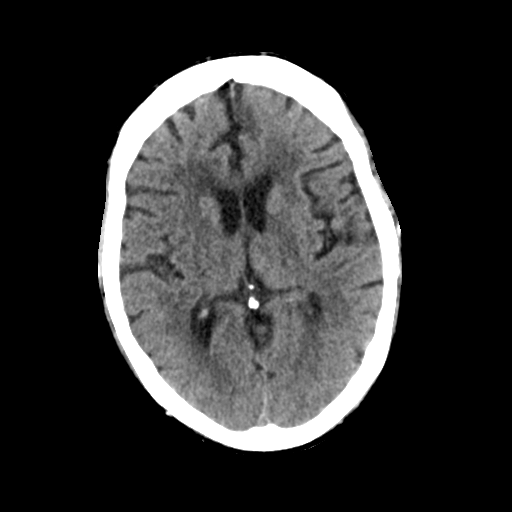
[im 19/30  brain]
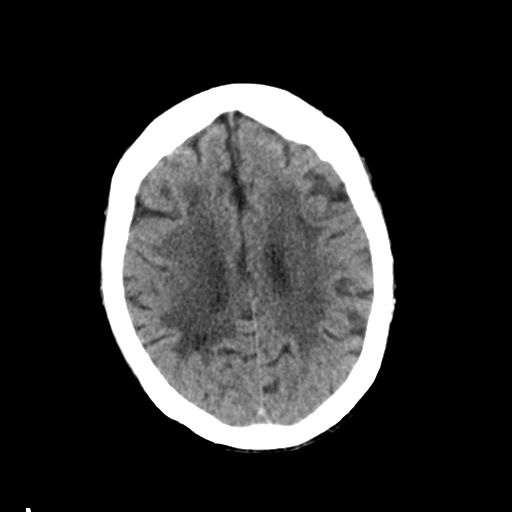
[im 19/30  bone]
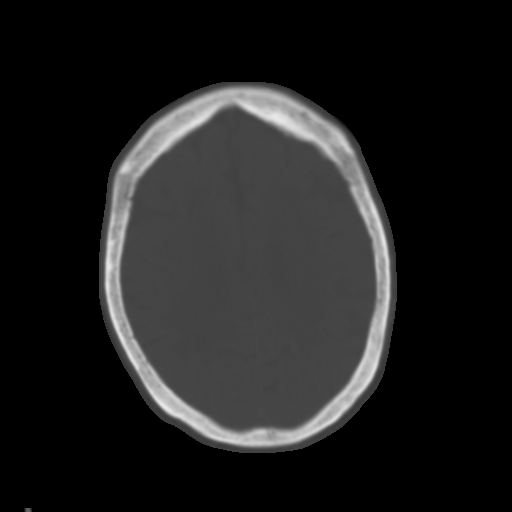
[im 22/30  brain]
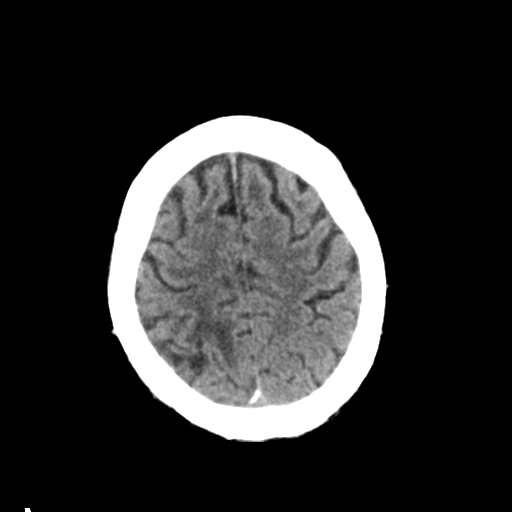
[im 26/30  brain]
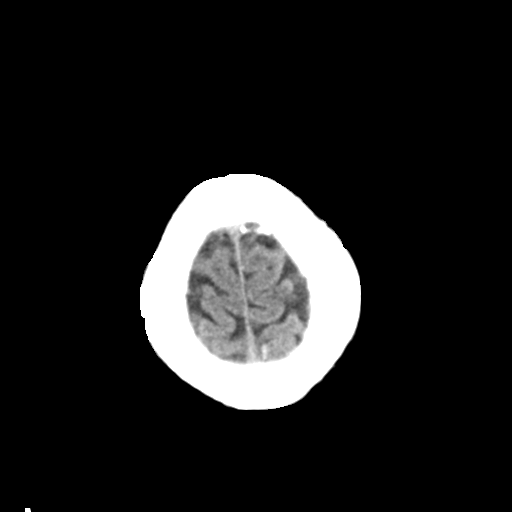

[Series 4: head bone · axial · 0.44mm/px · z∈[-121,-91]mm · 3 of 75 slices shown]
[im 8/75  bone]
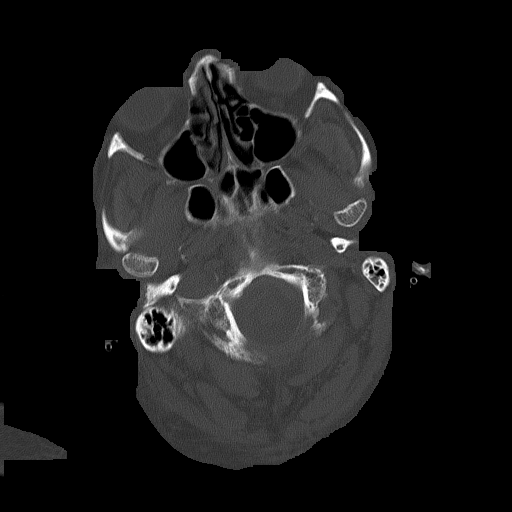
[im 15/75  bone]
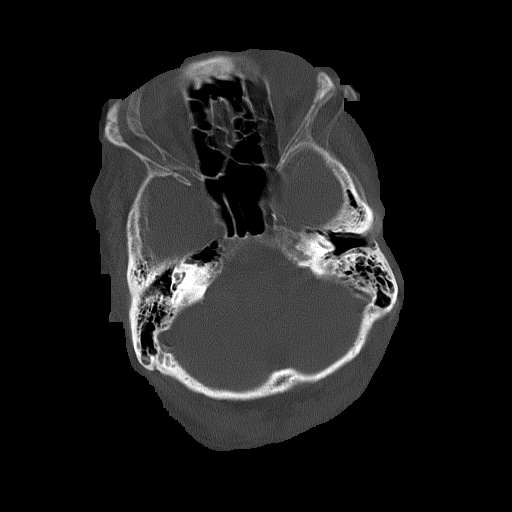
[im 23/75  bone]
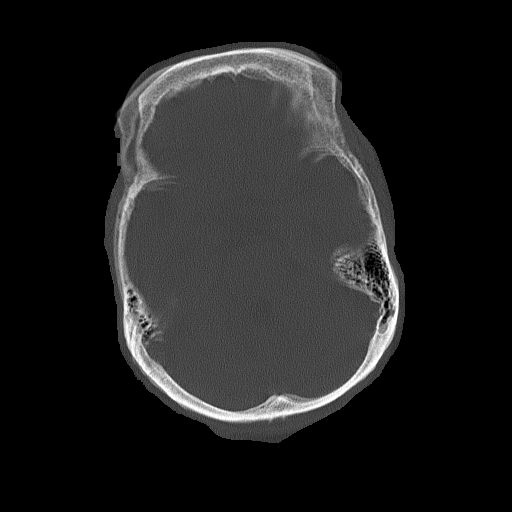

[Series 5: head without cor · coronal · non-contrast · 0.32mm/px · 3 of 65 slices shown]
[im 22/65  brain]
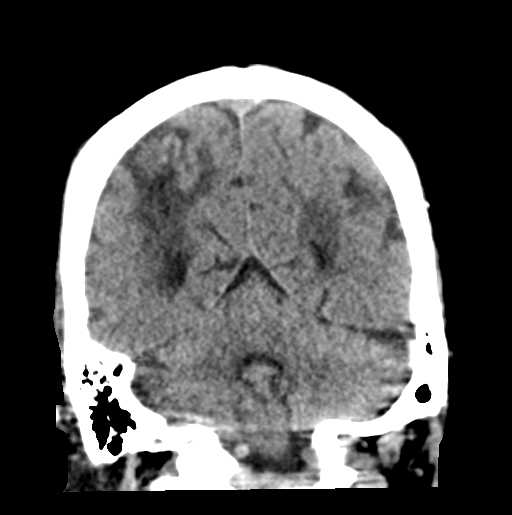
[im 29/65  brain]
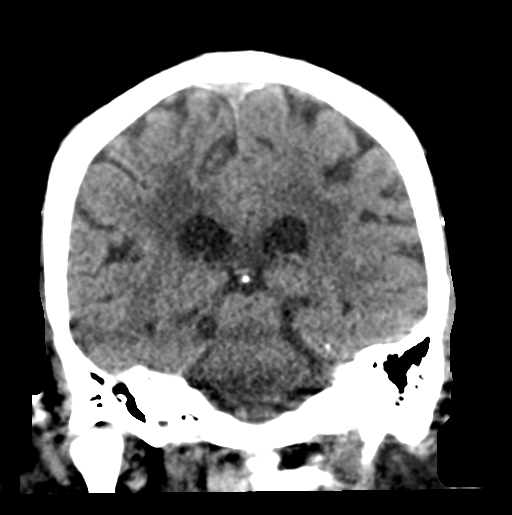
[im 36/65  brain]
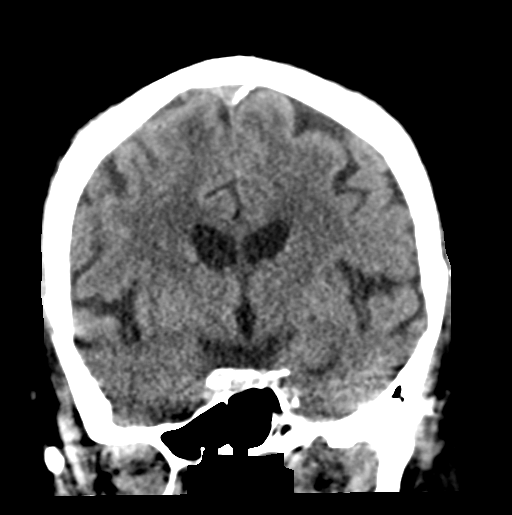

[Series 6: head without sag · sagittal · non-contrast · 0.32mm/px · 3 of 55 slices shown]
[im 19/55  brain]
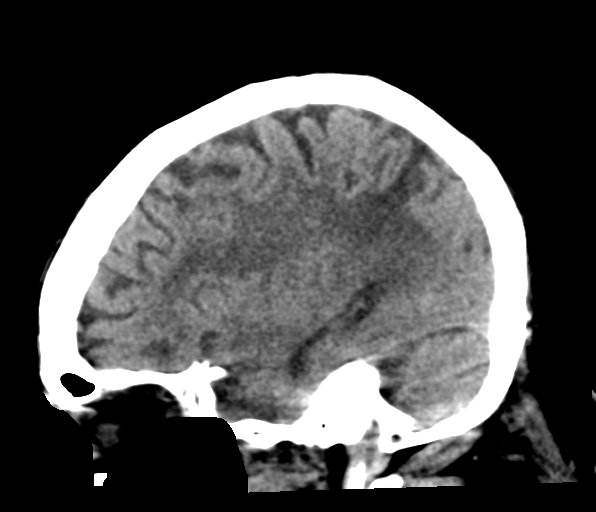
[im 28/55  brain]
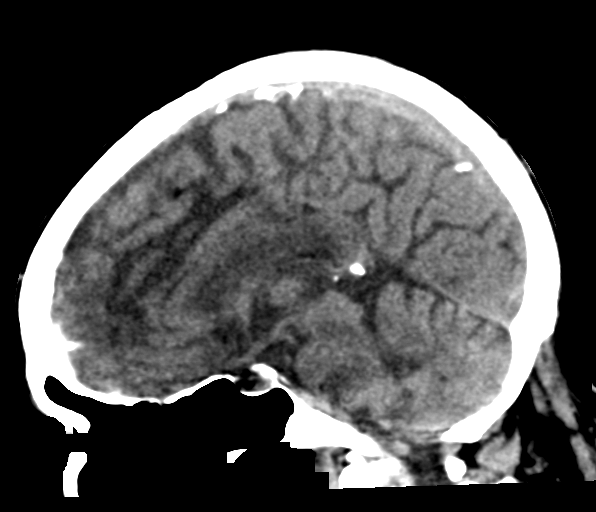
[im 37/55  brain]
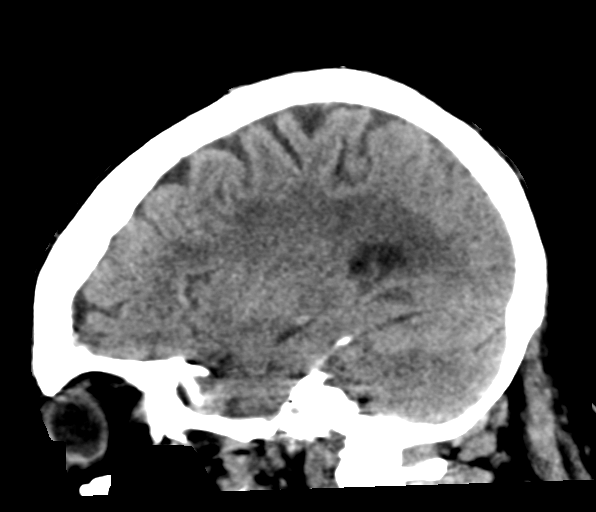

[16 of 47 positions shown; findings below may reference images not displayed]

FINDINGS: Brain: No midline shift, mass effect, or evidence of intracranial
mass lesion. No ventriculomegaly. Confluent bilateral cerebral white
matter hypodensity with superimposed right parietal lobe cortical
and subcortical encephalomalacia, stable from last month.

No acute intracranial hemorrhage identified. No cortically based
acute infarct identified.

Vascular: Calcified atherosclerosis at the skull base.

Skull: Motion artifact at the skull base. Stable visualized osseous
structures. Congenital incomplete ossification of the posterior C1
ring.

Sinuses/Orbits: Visualized paranasal sinuses and mastoids are stable
and well aerated.

Other: No acute orbit or scalp soft tissue finding. Calcified scalp
vessel atherosclerosis.
IMPRESSION: 1. No acute intracranial abnormality.
2. Chronic white matter disease and right parietal lobe
encephalomalacia since last month.
